# Patient Record
Sex: Female | Born: 1950
Health system: Southern US, Community
[De-identification: ages and names within clinical notes are randomized; demographics above are authoritative.]

## PROBLEM LIST (undated history)

## (undated) ENCOUNTER — Emergency Department (HOSPITAL_COMMUNITY): Discharge status: Against Medical Advice | Payer: BC Managed Care – PPO

## (undated) DIAGNOSIS — E785 Hyperlipidemia, unspecified: Secondary | ICD-10-CM

## (undated) DIAGNOSIS — K2970 Gastritis, unspecified, without bleeding: Secondary | ICD-10-CM

## (undated) DIAGNOSIS — E039 Hypothyroidism, unspecified: Secondary | ICD-10-CM

## (undated) DIAGNOSIS — M8580 Other specified disorders of bone density and structure, unspecified site: Secondary | ICD-10-CM

## (undated) DIAGNOSIS — D649 Anemia, unspecified: Secondary | ICD-10-CM

## (undated) DIAGNOSIS — F329 Major depressive disorder, single episode, unspecified: Secondary | ICD-10-CM

## (undated) DIAGNOSIS — F32A Depression, unspecified: Secondary | ICD-10-CM

## (undated) DIAGNOSIS — K219 Gastro-esophageal reflux disease without esophagitis: Secondary | ICD-10-CM

## (undated) DIAGNOSIS — Z9181 History of falling: Secondary | ICD-10-CM

## (undated) DIAGNOSIS — F419 Anxiety disorder, unspecified: Secondary | ICD-10-CM

## (undated) DIAGNOSIS — I10 Essential (primary) hypertension: Secondary | ICD-10-CM

## (undated) DIAGNOSIS — E063 Autoimmune thyroiditis: Secondary | ICD-10-CM

## (undated) DIAGNOSIS — M25519 Pain in unspecified shoulder: Secondary | ICD-10-CM

## (undated) HISTORY — DX: Other specified disorders of bone density and structure, unspecified site: M85.80

## (undated) HISTORY — PX: OTHER SURGICAL HISTORY: SHX169

## (undated) HISTORY — DX: Hyperlipidemia, unspecified: E78.5

## (undated) HISTORY — DX: Essential (primary) hypertension: I10

## (undated) HISTORY — DX: Hypothyroidism, unspecified: E03.9

## (undated) HISTORY — DX: Autoimmune thyroiditis: E06.3

## (undated) HISTORY — DX: Gastritis, unspecified, without bleeding: K29.70

## (undated) HISTORY — PX: BREAST SURGERY: SHX581

## (undated) HISTORY — DX: Gastro-esophageal reflux disease without esophagitis: K21.9

## (undated) HISTORY — DX: Pain in unspecified shoulder: M25.519

## (undated) HISTORY — PX: UMBILICAL HERNIA REPAIR: SHX196

---
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
## 1992-10-31 HISTORY — PX: TOTAL ABDOMINAL HYSTERECTOMY: SHX209

## 2001-05-08 ENCOUNTER — Other Ambulatory Visit: Admission: RE | Admit: 2001-05-08 | Discharge: 2001-05-08 | Payer: Self-pay | Admitting: *Deleted

## 2001-05-25 ENCOUNTER — Ambulatory Visit (HOSPITAL_COMMUNITY): Admission: RE | Admit: 2001-05-25 | Discharge: 2001-05-25 | Payer: Self-pay | Admitting: General Surgery

## 2001-05-28 ENCOUNTER — Encounter: Payer: Self-pay | Admitting: General Surgery

## 2001-05-28 ENCOUNTER — Ambulatory Visit (HOSPITAL_COMMUNITY): Admission: RE | Admit: 2001-05-28 | Discharge: 2001-05-28 | Payer: Self-pay | Admitting: General Surgery

## 2001-06-18 ENCOUNTER — Encounter: Payer: Self-pay | Admitting: Family Medicine

## 2001-06-18 ENCOUNTER — Ambulatory Visit (HOSPITAL_COMMUNITY): Admission: RE | Admit: 2001-06-18 | Discharge: 2001-06-18 | Payer: Self-pay | Admitting: Family Medicine

## 2001-07-16 ENCOUNTER — Ambulatory Visit (HOSPITAL_COMMUNITY): Admission: RE | Admit: 2001-07-16 | Discharge: 2001-07-16 | Payer: Self-pay | Admitting: Family Medicine

## 2001-07-16 ENCOUNTER — Encounter: Payer: Self-pay | Admitting: Family Medicine

## 2002-01-14 ENCOUNTER — Encounter: Payer: Self-pay | Admitting: Family Medicine

## 2002-01-14 ENCOUNTER — Ambulatory Visit (HOSPITAL_COMMUNITY): Admission: RE | Admit: 2002-01-14 | Discharge: 2002-01-14 | Payer: Self-pay | Admitting: Family Medicine

## 2002-04-04 ENCOUNTER — Ambulatory Visit (HOSPITAL_COMMUNITY): Admission: RE | Admit: 2002-04-04 | Discharge: 2002-04-04 | Payer: Self-pay | Admitting: Neurology

## 2002-04-04 ENCOUNTER — Encounter: Payer: Self-pay | Admitting: Neurology

## 2002-07-12 ENCOUNTER — Ambulatory Visit (HOSPITAL_COMMUNITY): Admission: RE | Admit: 2002-07-12 | Discharge: 2002-07-12 | Payer: Self-pay | Admitting: *Deleted

## 2002-07-12 ENCOUNTER — Encounter: Payer: Self-pay | Admitting: *Deleted

## 2002-08-06 ENCOUNTER — Encounter (HOSPITAL_COMMUNITY): Admission: RE | Admit: 2002-08-06 | Discharge: 2002-09-05 | Payer: Self-pay | Admitting: Endocrinology

## 2002-09-16 ENCOUNTER — Encounter: Payer: Self-pay | Admitting: General Surgery

## 2002-09-17 ENCOUNTER — Inpatient Hospital Stay (HOSPITAL_COMMUNITY): Admission: RE | Admit: 2002-09-17 | Discharge: 2002-09-19 | Payer: Self-pay | Admitting: General Surgery

## 2002-09-17 ENCOUNTER — Encounter: Payer: Self-pay | Admitting: Family Medicine

## 2002-09-22 ENCOUNTER — Emergency Department (HOSPITAL_COMMUNITY): Admission: EM | Admit: 2002-09-22 | Discharge: 2002-09-22 | Payer: Self-pay | Admitting: *Deleted

## 2002-10-31 HISTORY — PX: OTHER SURGICAL HISTORY: SHX169

## 2003-04-11 ENCOUNTER — Encounter: Payer: Self-pay | Admitting: Family Medicine

## 2003-04-11 ENCOUNTER — Ambulatory Visit (HOSPITAL_COMMUNITY): Admission: RE | Admit: 2003-04-11 | Discharge: 2003-04-11 | Payer: Self-pay | Admitting: Family Medicine

## 2003-04-16 ENCOUNTER — Encounter: Payer: Self-pay | Admitting: Family Medicine

## 2003-04-16 ENCOUNTER — Encounter (HOSPITAL_COMMUNITY): Admission: RE | Admit: 2003-04-16 | Discharge: 2003-05-16 | Payer: Self-pay | Admitting: Family Medicine

## 2003-07-14 ENCOUNTER — Ambulatory Visit (HOSPITAL_COMMUNITY): Admission: RE | Admit: 2003-07-14 | Discharge: 2003-07-14 | Payer: Self-pay | Admitting: Family Medicine

## 2003-07-14 ENCOUNTER — Encounter: Payer: Self-pay | Admitting: Family Medicine

## 2003-09-22 ENCOUNTER — Ambulatory Visit (HOSPITAL_COMMUNITY): Admission: RE | Admit: 2003-09-22 | Discharge: 2003-09-22 | Payer: Self-pay | Admitting: Family Medicine

## 2003-10-12 ENCOUNTER — Emergency Department (HOSPITAL_COMMUNITY): Admission: EM | Admit: 2003-10-12 | Discharge: 2003-10-12 | Payer: Self-pay | Admitting: Emergency Medicine

## 2003-11-01 HISTORY — PX: OTHER SURGICAL HISTORY: SHX169

## 2004-07-14 ENCOUNTER — Ambulatory Visit (HOSPITAL_COMMUNITY): Admission: RE | Admit: 2004-07-14 | Discharge: 2004-07-14 | Payer: Self-pay | Admitting: Family Medicine

## 2004-08-03 ENCOUNTER — Encounter (HOSPITAL_COMMUNITY): Admission: RE | Admit: 2004-08-03 | Discharge: 2004-09-02 | Payer: Self-pay | Admitting: Oncology

## 2004-08-03 ENCOUNTER — Encounter: Admission: RE | Admit: 2004-08-03 | Discharge: 2004-08-03 | Payer: Self-pay | Admitting: Oncology

## 2004-10-11 ENCOUNTER — Ambulatory Visit: Payer: Self-pay | Admitting: Family Medicine

## 2004-11-11 ENCOUNTER — Ambulatory Visit: Payer: Self-pay | Admitting: Family Medicine

## 2004-12-02 ENCOUNTER — Ambulatory Visit: Payer: Self-pay | Admitting: Family Medicine

## 2005-01-05 ENCOUNTER — Ambulatory Visit: Payer: Self-pay | Admitting: Family Medicine

## 2005-01-21 ENCOUNTER — Ambulatory Visit: Payer: Self-pay | Admitting: Family Medicine

## 2005-01-27 ENCOUNTER — Ambulatory Visit: Payer: Self-pay | Admitting: Family Medicine

## 2005-03-14 ENCOUNTER — Ambulatory Visit: Payer: Self-pay | Admitting: Family Medicine

## 2005-04-28 ENCOUNTER — Ambulatory Visit: Payer: Self-pay | Admitting: Family Medicine

## 2005-06-24 ENCOUNTER — Ambulatory Visit: Payer: Self-pay | Admitting: Family Medicine

## 2005-07-15 ENCOUNTER — Ambulatory Visit (HOSPITAL_COMMUNITY): Admission: RE | Admit: 2005-07-15 | Discharge: 2005-07-15 | Payer: Self-pay | Admitting: Family Medicine

## 2005-08-10 ENCOUNTER — Ambulatory Visit: Payer: Self-pay | Admitting: Family Medicine

## 2005-08-23 ENCOUNTER — Ambulatory Visit: Payer: Self-pay | Admitting: Family Medicine

## 2005-10-18 ENCOUNTER — Emergency Department (HOSPITAL_COMMUNITY): Admission: EM | Admit: 2005-10-18 | Discharge: 2005-10-18 | Payer: Self-pay | Admitting: Emergency Medicine

## 2005-11-15 ENCOUNTER — Encounter: Payer: Self-pay | Admitting: Family Medicine

## 2005-12-12 ENCOUNTER — Ambulatory Visit: Payer: Self-pay | Admitting: Family Medicine

## 2005-12-13 ENCOUNTER — Ambulatory Visit (HOSPITAL_COMMUNITY): Admission: RE | Admit: 2005-12-13 | Discharge: 2005-12-13 | Payer: Self-pay | Admitting: Family Medicine

## 2005-12-21 ENCOUNTER — Encounter: Payer: Self-pay | Admitting: Family Medicine

## 2005-12-29 ENCOUNTER — Ambulatory Visit: Payer: Self-pay | Admitting: Family Medicine

## 2006-01-04 ENCOUNTER — Ambulatory Visit: Payer: Self-pay | Admitting: Family Medicine

## 2006-01-11 ENCOUNTER — Ambulatory Visit (HOSPITAL_COMMUNITY): Admission: RE | Admit: 2006-01-11 | Discharge: 2006-01-11 | Payer: Self-pay | Admitting: Family Medicine

## 2006-01-11 ENCOUNTER — Encounter (HOSPITAL_COMMUNITY): Admission: RE | Admit: 2006-01-11 | Discharge: 2006-02-10 | Payer: Self-pay | Admitting: Family Medicine

## 2006-01-11 IMAGING — CR DG KNEE 4+V BILAT
8 series · 8 of 8 positions shown · non-contrast
Comparison: none

CLINICAL DATA: 54 year-old who fell.
 BILATERAL KNEES ? 4 VIEW:
 LEFT KNEE ? 2 VIEW:

[view not recorded (1 of 8)]
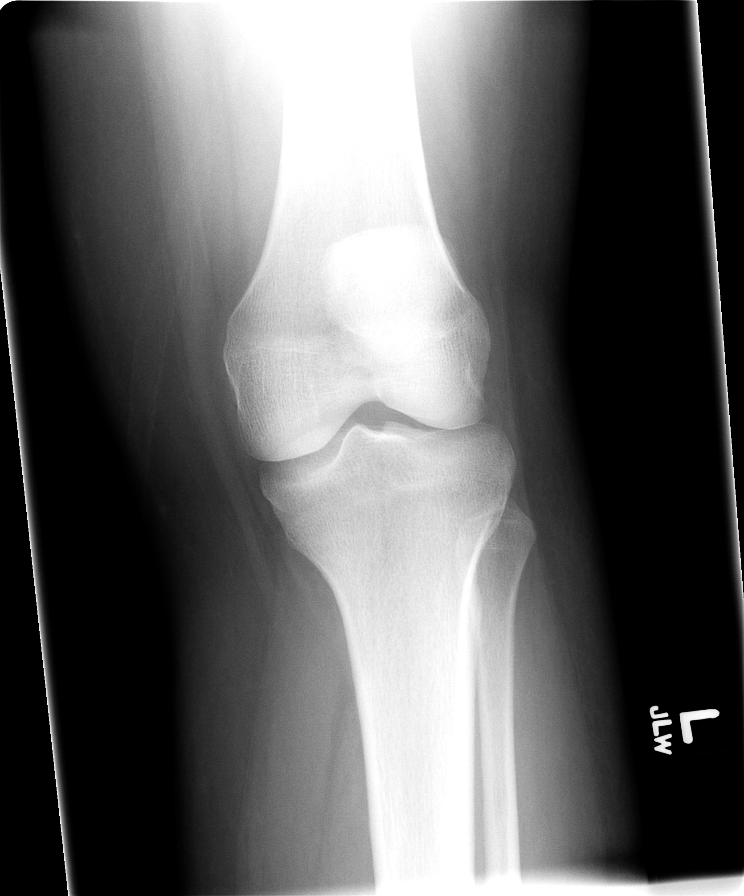

[view not recorded (2 of 8)]
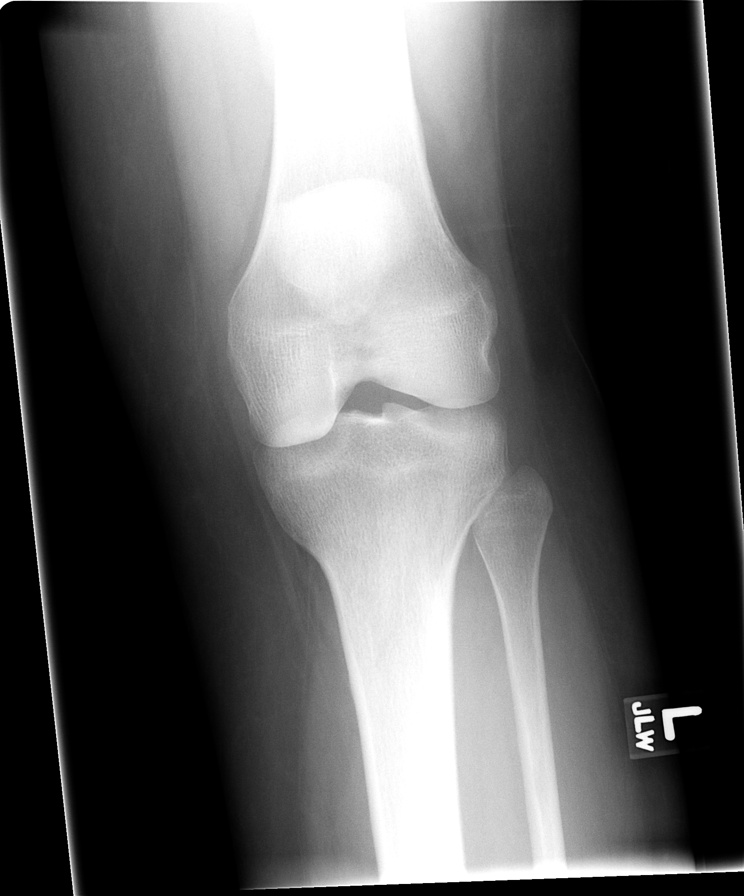

[view not recorded (3 of 8)]
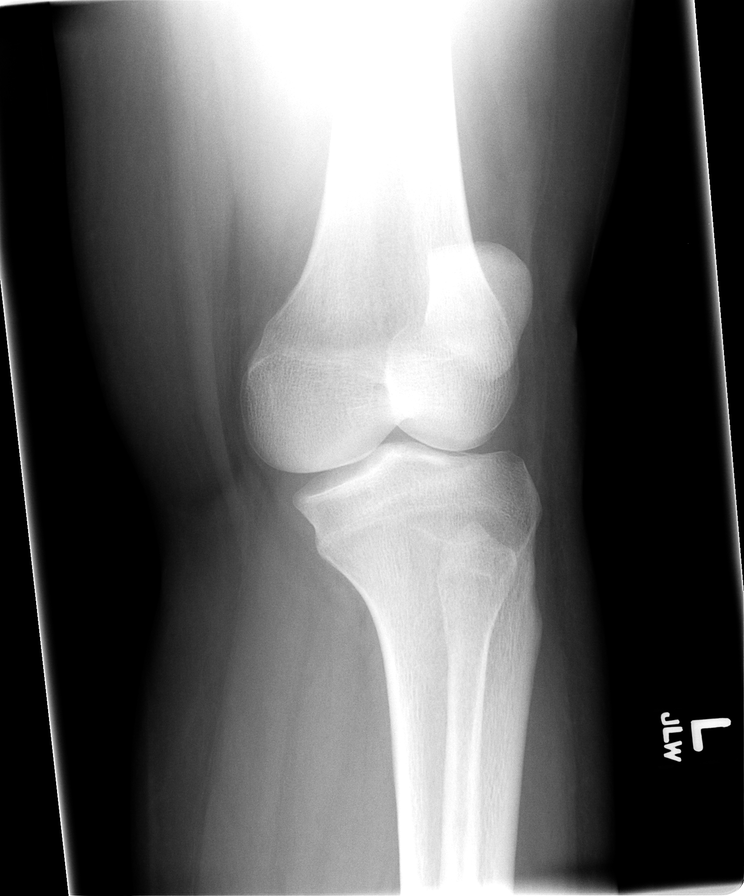

[view not recorded (4 of 8)]
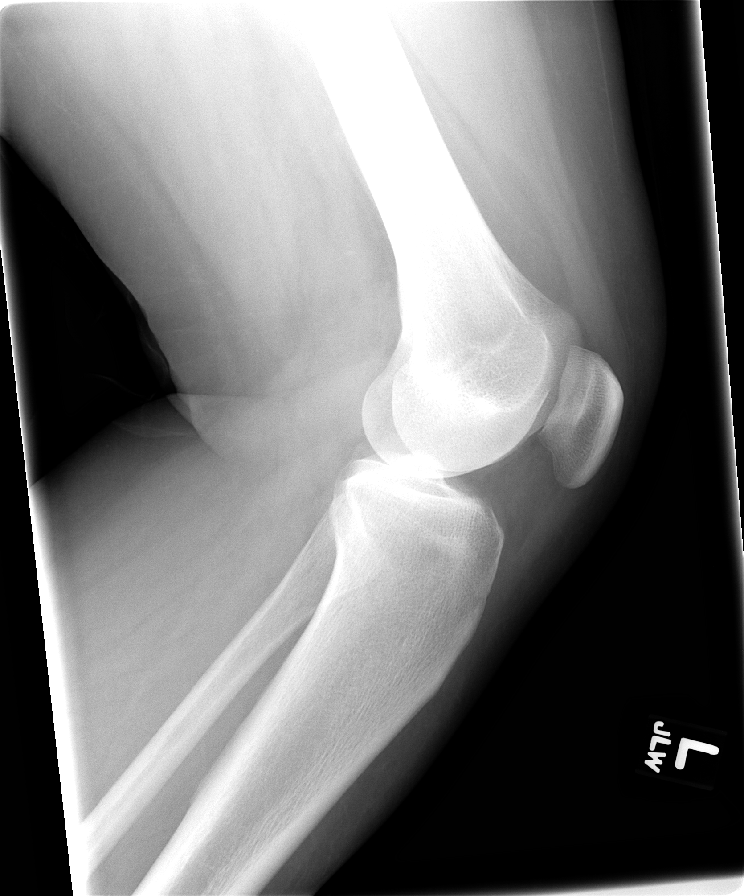

[view not recorded (5 of 8)]
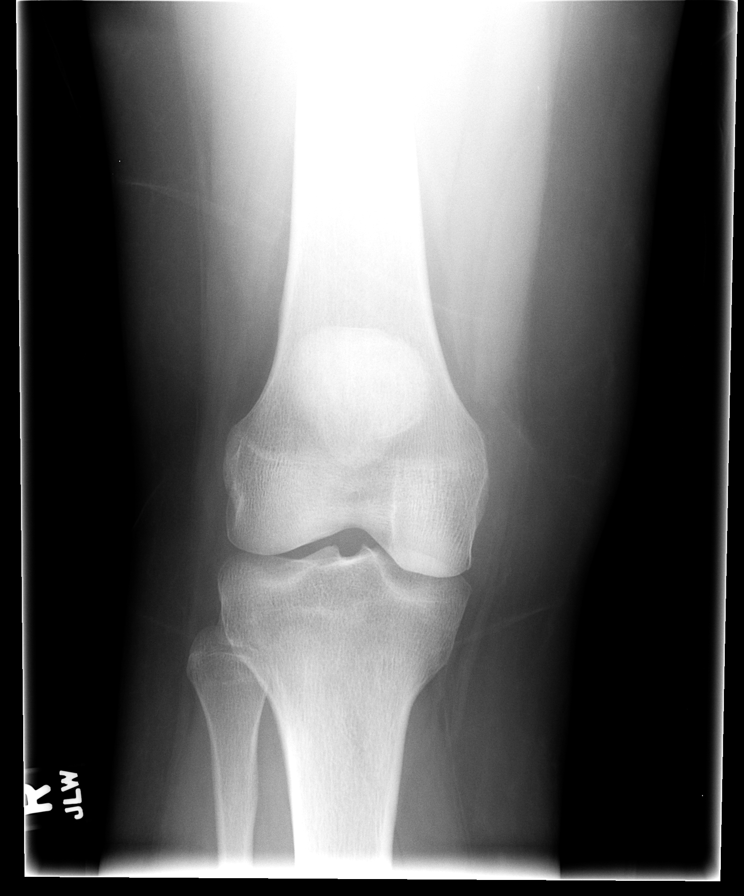

[view not recorded (6 of 8)]
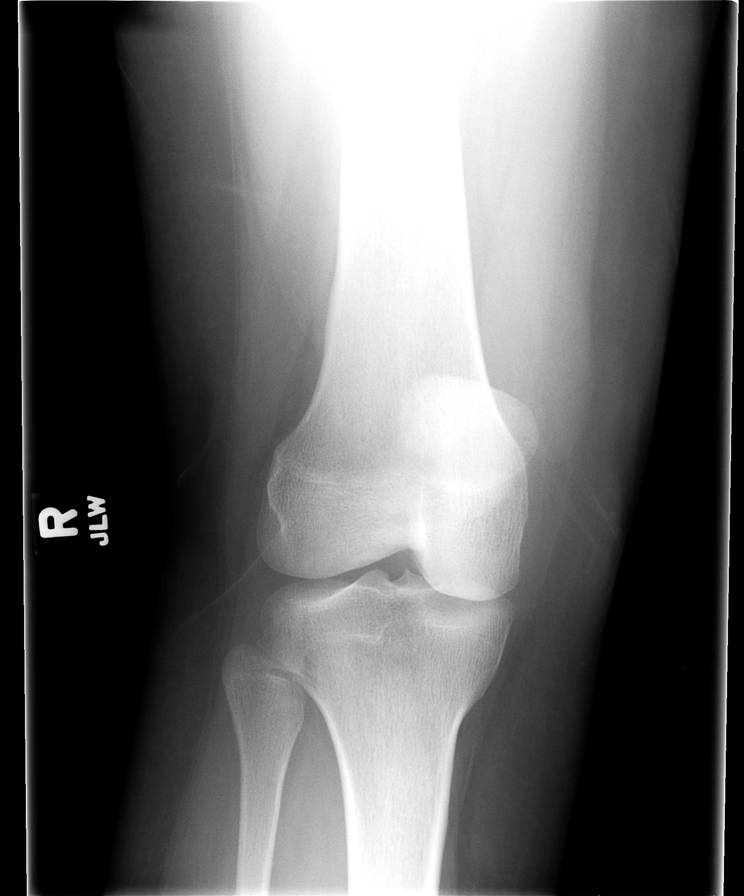

[view not recorded (7 of 8)]
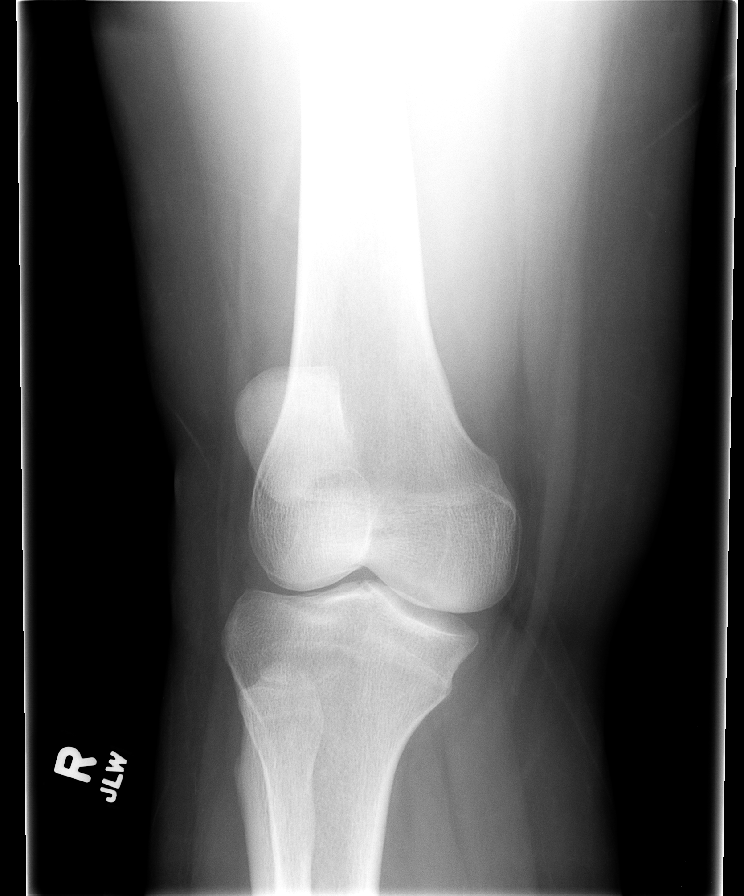

[view not recorded (8 of 8)]
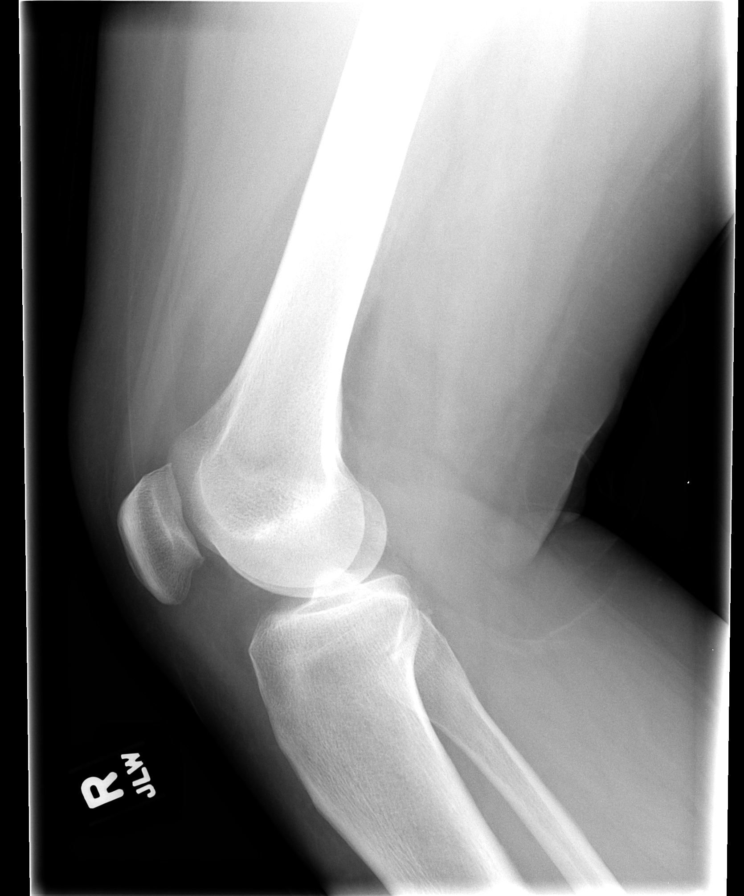

[8 of 8 positions shown; findings below may reference images not displayed]

FINDINGS: There is no evidence of fracture, dislocation, or joint effusion.  There is no evidence of arthropathy or other focal bone abnormality.  Soft tissues are unremarkable.
IMPRESSION: Negative.
 RIGHT KNEE ? 2 VIEW:
FINDINGS: There is no evidence of fracture, dislocation, or joint effusion.  There is no evidence of arthropathy or other focal bone abnormality.  Soft tissues are unremarkable.
IMPRESSION: Negative.

## 2006-01-16 ENCOUNTER — Ambulatory Visit: Payer: Self-pay | Admitting: Family Medicine

## 2006-02-01 ENCOUNTER — Ambulatory Visit: Payer: Self-pay | Admitting: Family Medicine

## 2006-02-02 ENCOUNTER — Ambulatory Visit (HOSPITAL_COMMUNITY): Admission: RE | Admit: 2006-02-02 | Discharge: 2006-02-02 | Payer: Self-pay | Admitting: General Surgery

## 2006-02-02 LAB — HM COLONOSCOPY: HM Colonoscopy: NORMAL

## 2006-02-08 ENCOUNTER — Ambulatory Visit: Payer: Self-pay | Admitting: Family Medicine

## 2006-02-14 ENCOUNTER — Encounter (HOSPITAL_COMMUNITY): Admission: RE | Admit: 2006-02-14 | Discharge: 2006-03-16 | Payer: Self-pay | Admitting: Family Medicine

## 2006-03-08 ENCOUNTER — Ambulatory Visit: Payer: Self-pay | Admitting: Family Medicine

## 2006-03-20 ENCOUNTER — Encounter (HOSPITAL_COMMUNITY): Admission: RE | Admit: 2006-03-20 | Discharge: 2006-04-19 | Payer: Self-pay | Admitting: Family Medicine

## 2006-05-10 ENCOUNTER — Ambulatory Visit: Payer: Self-pay | Admitting: Family Medicine

## 2006-05-10 ENCOUNTER — Other Ambulatory Visit: Admission: RE | Admit: 2006-05-10 | Discharge: 2006-05-10 | Payer: Self-pay | Admitting: Family Medicine

## 2006-06-22 ENCOUNTER — Encounter: Payer: Self-pay | Admitting: Family Medicine

## 2006-08-07 ENCOUNTER — Ambulatory Visit (HOSPITAL_COMMUNITY): Admission: RE | Admit: 2006-08-07 | Discharge: 2006-08-07 | Payer: Self-pay | Admitting: Family Medicine

## 2006-08-21 ENCOUNTER — Ambulatory Visit: Payer: Self-pay | Admitting: Family Medicine

## 2006-09-11 ENCOUNTER — Ambulatory Visit: Payer: Self-pay | Admitting: Family Medicine

## 2006-12-18 ENCOUNTER — Ambulatory Visit: Payer: Self-pay | Admitting: Family Medicine

## 2007-01-03 ENCOUNTER — Ambulatory Visit: Payer: Self-pay | Admitting: Family Medicine

## 2007-03-28 ENCOUNTER — Ambulatory Visit: Payer: Self-pay | Admitting: Family Medicine

## 2007-05-14 ENCOUNTER — Ambulatory Visit: Payer: Self-pay | Admitting: Family Medicine

## 2007-05-14 ENCOUNTER — Encounter: Payer: Self-pay | Admitting: Family Medicine

## 2007-05-14 ENCOUNTER — Other Ambulatory Visit: Admission: RE | Admit: 2007-05-14 | Discharge: 2007-05-14 | Payer: Self-pay | Admitting: Family Medicine

## 2007-05-16 ENCOUNTER — Encounter: Payer: Self-pay | Admitting: Family Medicine

## 2007-05-16 LAB — CONVERTED CEMR LAB
Candida species: NEGATIVE
GC Probe Amp, Genital: NEGATIVE
Trichomonal Vaginitis: NEGATIVE

## 2007-06-21 ENCOUNTER — Encounter: Payer: Self-pay | Admitting: Family Medicine

## 2007-08-10 ENCOUNTER — Encounter: Payer: Self-pay | Admitting: Family Medicine

## 2007-08-10 LAB — CONVERTED CEMR LAB
AST: 19 units/L (ref 0–37)
Alkaline Phosphatase: 65 units/L (ref 39–117)
Bilirubin, Direct: <0.1 mg/dL (ref 0.0–0.3)
CO2: 26 meq/L (ref 19–32)
Calcium: 9.5 mg/dL (ref 8.4–10.5)
Creatinine, Ser: 0.99 mg/dL (ref 0.40–1.20)
Glucose, Bld: 85 mg/dL (ref 70–99)
Total Bilirubin: 0.5 mg/dL (ref 0.3–1.2)

## 2007-08-13 ENCOUNTER — Ambulatory Visit (HOSPITAL_COMMUNITY): Admission: RE | Admit: 2007-08-13 | Discharge: 2007-08-13 | Payer: Self-pay | Admitting: Family Medicine

## 2007-08-14 ENCOUNTER — Ambulatory Visit: Payer: Self-pay | Admitting: Family Medicine

## 2007-09-17 ENCOUNTER — Ambulatory Visit: Payer: Self-pay | Admitting: Family Medicine

## 2007-11-01 ENCOUNTER — Encounter: Payer: Self-pay | Admitting: Family Medicine

## 2007-11-01 HISTORY — PX: OTHER SURGICAL HISTORY: SHX169

## 2007-12-18 ENCOUNTER — Ambulatory Visit: Payer: Self-pay | Admitting: Family Medicine

## 2007-12-20 ENCOUNTER — Encounter: Payer: Self-pay | Admitting: Family Medicine

## 2007-12-28 ENCOUNTER — Ambulatory Visit (HOSPITAL_COMMUNITY): Admission: RE | Admit: 2007-12-28 | Discharge: 2007-12-28 | Payer: Self-pay | Admitting: Family Medicine

## 2007-12-28 IMAGING — US US RENAL
1 series · 14 of 25 positions shown · non-contrast
Comparison: none

HISTORY: Hypertension, renal disease, flank pain

[Series 1: unknown · 0.26mm/px · 14 of 33 slices shown]
[im 1/33]
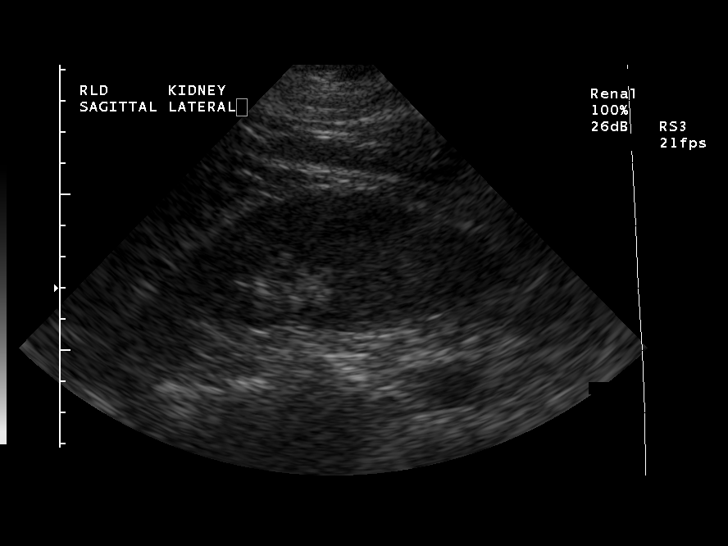
[im 3/33]
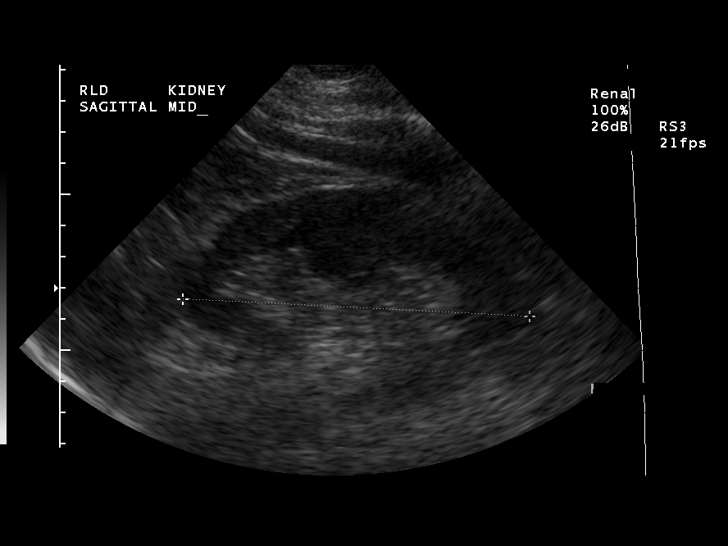
[im 6/33]
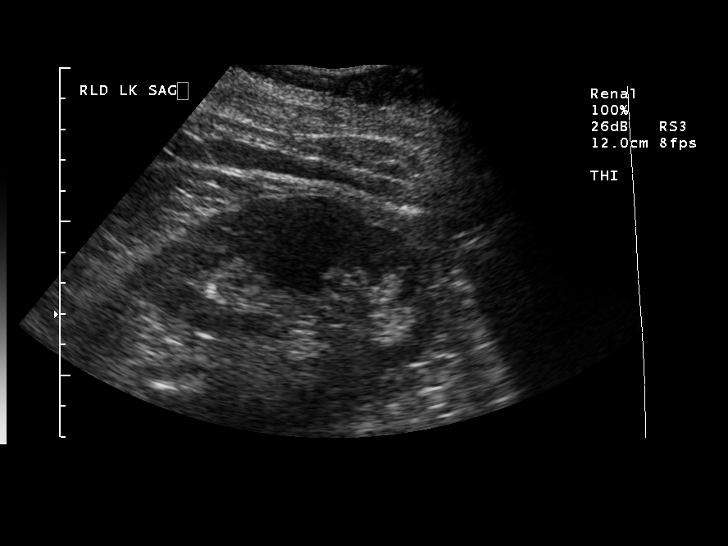
[im 9/33]
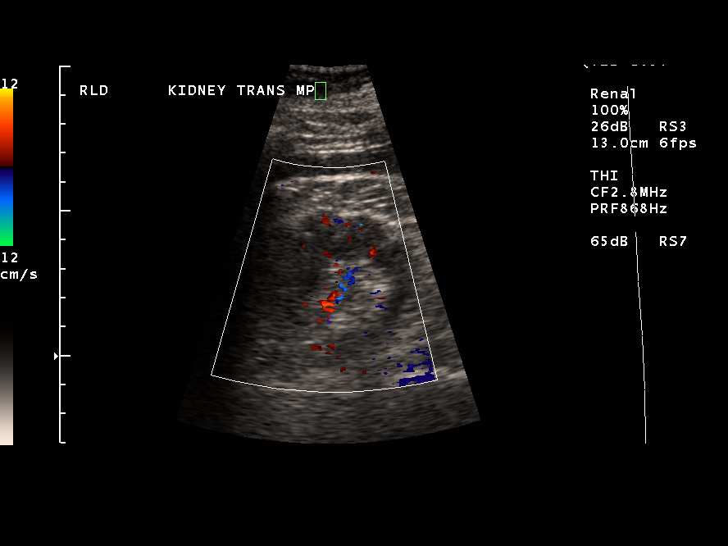
[im 11/33]
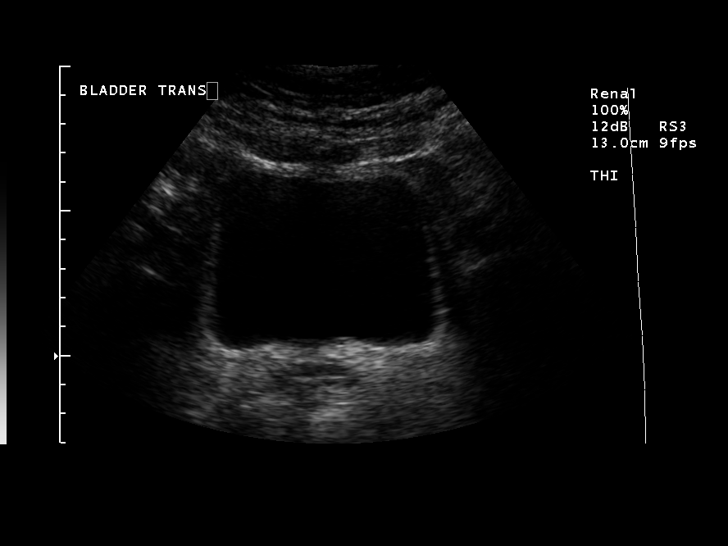
[im 13/33]
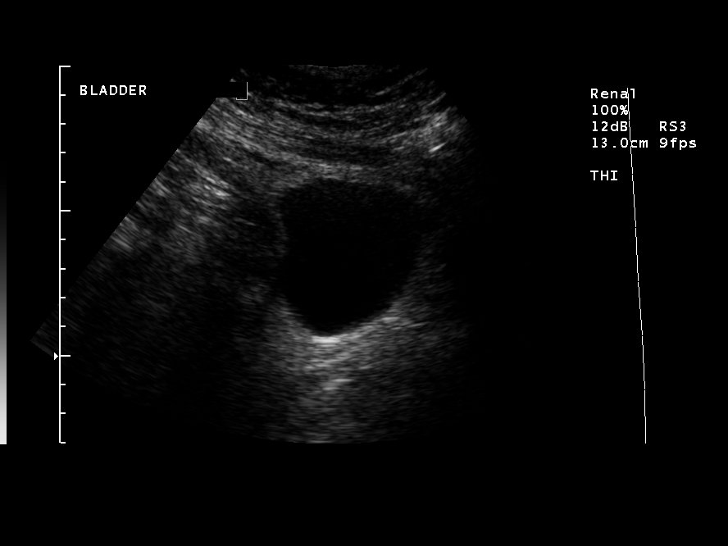
[im 15/33]
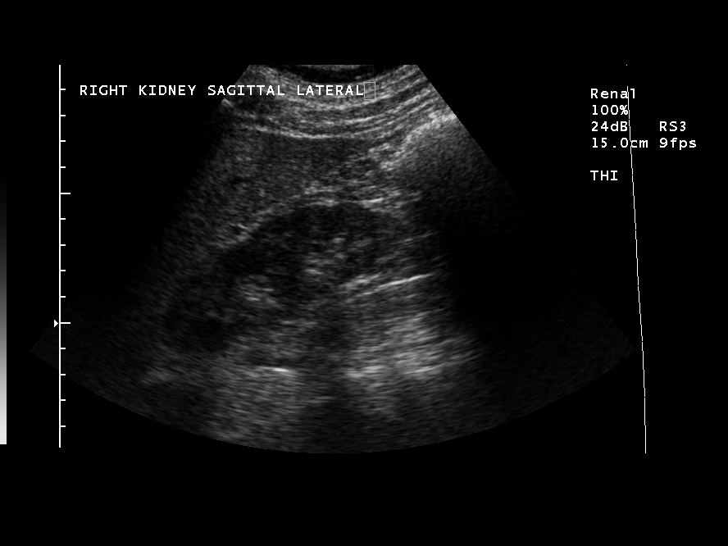
[im 18/33]
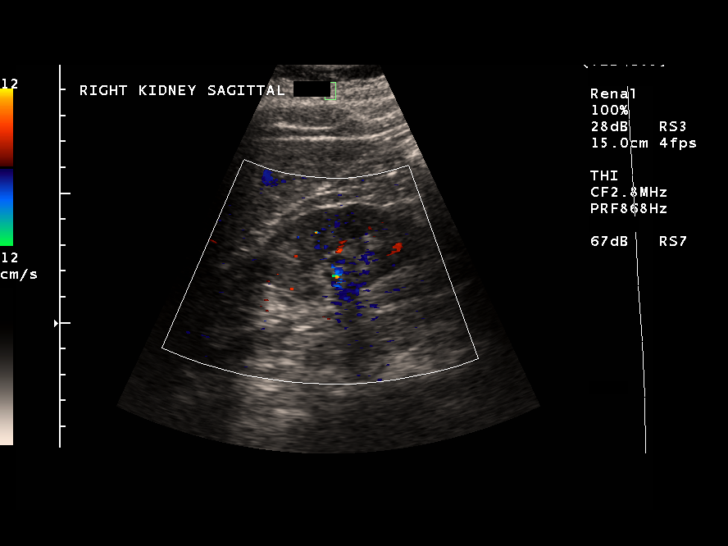
[im 21/33]
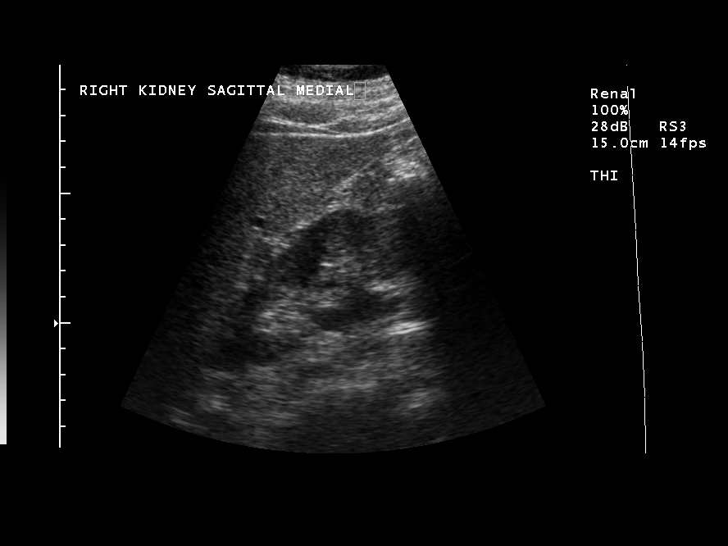
[im 22/33]
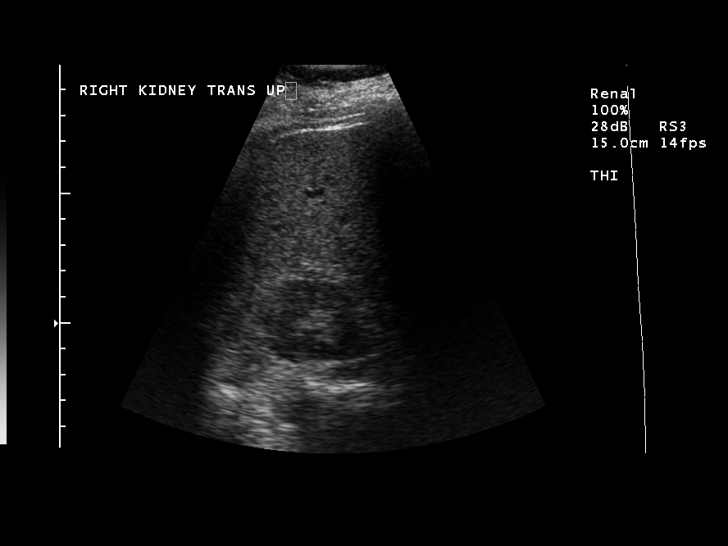
[im 25/33]
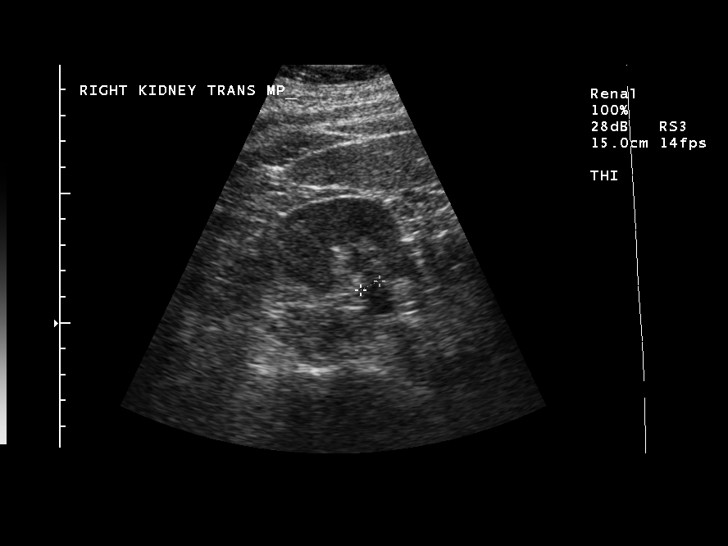
[im 27/33]
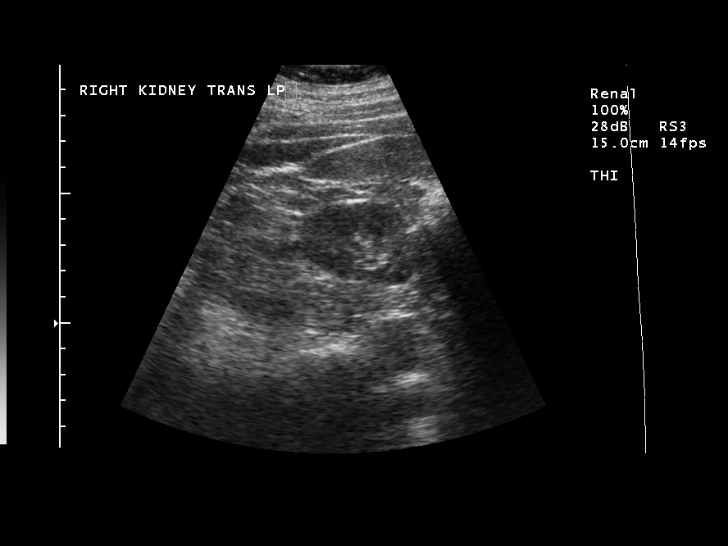
[im 30/33]
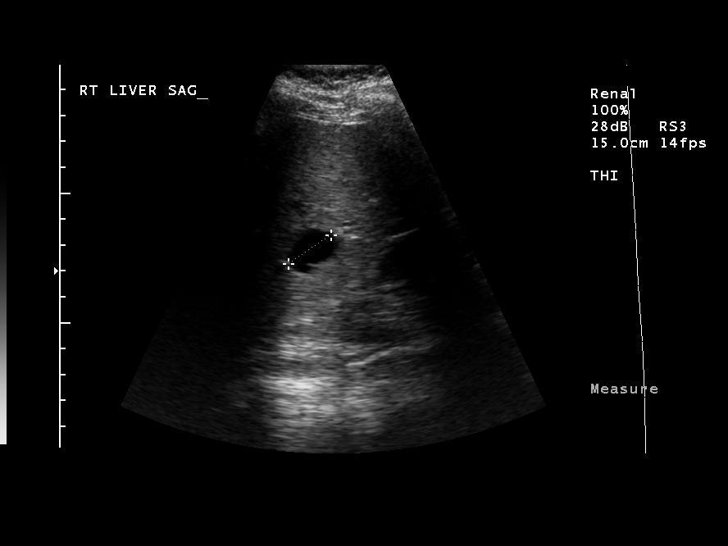
[im 33/33]
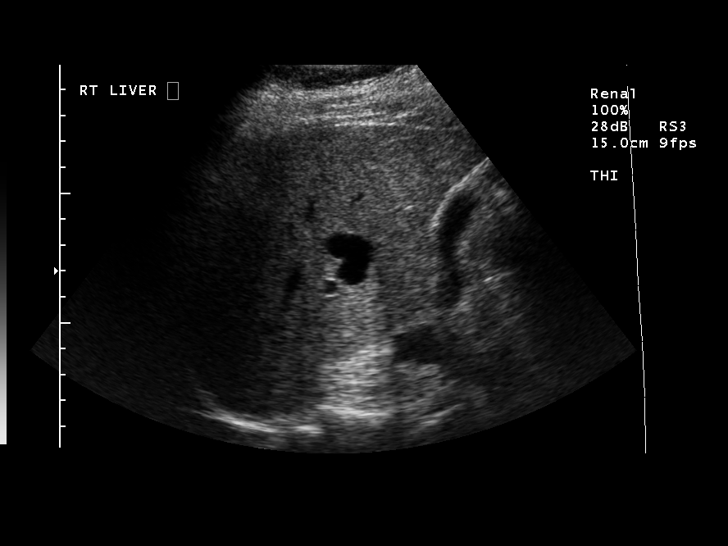

[14 of 25 positions shown; findings below may reference images not displayed]

RENAL ULTRASOUND:

Sonography of the kidneys and urinary bladder performed.

Kidneys measure 11.6 cm length right and 11.1 cm length left.
Normal renal cortical thickness and echogenicity bilaterally.
In addition, prominent renal pyramid identified at mid left kidney.
No renal mass or shadowing calcification.
Mildly prominent extrarenal pelvis of right kidney without definite calyceal
dilatation.
Bladder well distended and unremarkable.
Ureteral jets were not visualized.
Incidentally noted cyst within central liver., 2.4 cm greatest size.
IMPRESSION: Small hepatic cyst.
Minimal pelviectasis of right kidney without definite caliectasis.

## 2008-02-18 ENCOUNTER — Ambulatory Visit: Payer: Self-pay | Admitting: Family Medicine

## 2008-02-19 ENCOUNTER — Encounter: Payer: Self-pay | Admitting: Family Medicine

## 2008-02-19 LAB — CONVERTED CEMR LAB
AST: 18 units/L (ref 0–37)
Alkaline Phosphatase: 68 units/L (ref 39–117)
Bilirubin, Direct: <0.1 mg/dL (ref 0.0–0.3)
CO2: 29 meq/L (ref 19–32)
Calcium: 9.6 mg/dL (ref 8.4–10.5)
Creatinine, Ser: 1.04 mg/dL (ref 0.40–1.20)
Glucose, Bld: 87 mg/dL (ref 70–99)
TSH: 7.207 microintl units/mL — ABNORMAL HIGH (ref 0.350–5.50)
Total Bilirubin: 0.3 mg/dL (ref 0.3–1.2)
Total CHOL/HDL Ratio: 4.3

## 2008-03-03 ENCOUNTER — Ambulatory Visit (HOSPITAL_COMMUNITY): Admission: RE | Admit: 2008-03-03 | Discharge: 2008-03-03 | Payer: Self-pay | Admitting: Family Medicine

## 2008-03-03 ENCOUNTER — Ambulatory Visit: Payer: Self-pay | Admitting: Family Medicine

## 2008-03-03 IMAGING — US US EXREM LOW ARTERIAL SEG MULTIPLE BILAT
1 series · 1 of 1 positions shown · non-contrast
Comparison: None available

CLINICAL DATA: Hypertension, leg pain

BILAT LOWER EXTREMITY ARTERIAL SEGMENTAL EVAL

[Series 1: us exrem low arterial seg multiple bilat · 0.09mm/px · 1 of 1 slices shown]
[im 1/1]
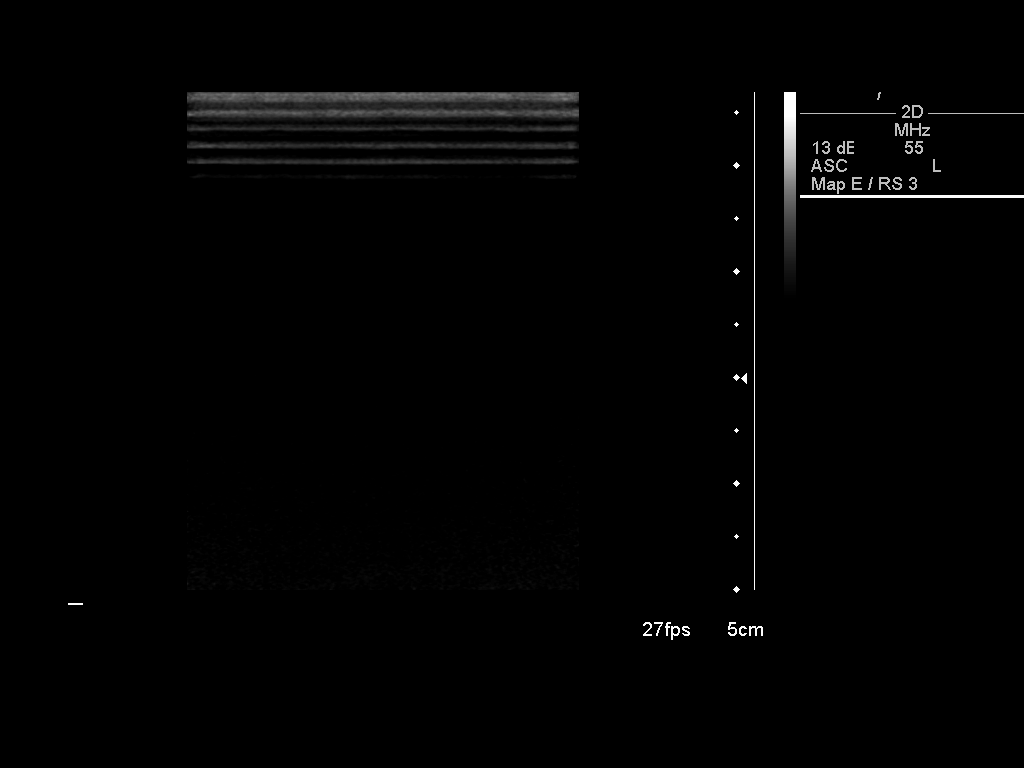

[1 of 1 positions shown; findings below may reference images not displayed]

FINDINGS: At rest, the right ABI is 1.16, left 1.14. Triphasic
waveforms are noted throughout both lower extremities on Doppler
interrogation. Pulse volume recording is normal bilaterally.
Exercise testing was not performed.
IMPRESSION: 1. No evidence of hemodynamically significant lower extremity
arterial occlusive disease at rest

## 2008-03-25 ENCOUNTER — Encounter (HOSPITAL_COMMUNITY): Admission: RE | Admit: 2008-03-25 | Discharge: 2008-04-24 | Payer: Self-pay | Admitting: Orthopedic Surgery

## 2008-05-16 ENCOUNTER — Encounter: Payer: Self-pay | Admitting: Family Medicine

## 2008-05-16 DIAGNOSIS — M25519 Pain in unspecified shoulder: Secondary | ICD-10-CM | POA: Insufficient documentation

## 2008-05-16 DIAGNOSIS — N3090 Cystitis, unspecified without hematuria: Secondary | ICD-10-CM | POA: Insufficient documentation

## 2008-05-16 DIAGNOSIS — M949 Disorder of cartilage, unspecified: Secondary | ICD-10-CM

## 2008-05-16 DIAGNOSIS — I10 Essential (primary) hypertension: Secondary | ICD-10-CM | POA: Insufficient documentation

## 2008-05-16 DIAGNOSIS — R32 Unspecified urinary incontinence: Secondary | ICD-10-CM | POA: Insufficient documentation

## 2008-05-16 DIAGNOSIS — M899 Disorder of bone, unspecified: Secondary | ICD-10-CM | POA: Insufficient documentation

## 2008-05-16 DIAGNOSIS — E782 Mixed hyperlipidemia: Secondary | ICD-10-CM | POA: Insufficient documentation

## 2008-05-16 DIAGNOSIS — E785 Hyperlipidemia, unspecified: Secondary | ICD-10-CM | POA: Insufficient documentation

## 2008-05-19 ENCOUNTER — Encounter: Payer: Self-pay | Admitting: Family Medicine

## 2008-05-19 ENCOUNTER — Ambulatory Visit: Payer: Self-pay | Admitting: Family Medicine

## 2008-05-19 ENCOUNTER — Other Ambulatory Visit: Admission: RE | Admit: 2008-05-19 | Discharge: 2008-05-19 | Payer: Self-pay | Admitting: Family Medicine

## 2008-05-19 LAB — CONVERTED CEMR LAB
Alkaline Phosphatase: 54 units/L (ref 39–117)
BUN: 13 mg/dL (ref 6–23)
Basophils Relative: 1 % (ref 0–1)
CO2: 26 meq/L (ref 19–32)
Chloride: 101 meq/L (ref 96–112)
Creatinine, Ser: 1.08 mg/dL (ref 0.40–1.20)
Eosinophils Relative: 1 % (ref 0–5)
HCT: 32.8 % — ABNORMAL LOW (ref 36.0–46.0)
Hemoglobin: 10.2 g/dL — ABNORMAL LOW (ref 12.0–15.0)
Indirect Bilirubin: 0.3 mg/dL (ref 0.0–0.9)
LDL Cholesterol: 112 mg/dL — ABNORMAL HIGH (ref 0–99)
Lymphocytes Relative: 44 % (ref 12–46)
MCHC: 31.1 g/dL (ref 30.0–36.0)
Monocytes Absolute: 0.4 10*3/uL (ref 0.1–1.0)
Monocytes Relative: 10 % (ref 3–12)
Neutro Abs: 1.6 10*3/uL — ABNORMAL LOW (ref 1.7–7.7)
RBC: 3.74 M/uL — ABNORMAL LOW (ref 3.87–5.11)
Total Bilirubin: 0.4 mg/dL (ref 0.3–1.2)

## 2008-06-05 ENCOUNTER — Ambulatory Visit (HOSPITAL_COMMUNITY): Admission: RE | Admit: 2008-06-05 | Discharge: 2008-06-05 | Payer: Self-pay | Admitting: Urology

## 2008-06-05 IMAGING — US US RENAL
1 series · 14 of 25 positions shown · non-contrast
Comparison: [DATE]

CLINICAL DATA: Urgency, and comments, UTI

RENAL/URINARY TRACT ULTRASOUND
TECHNIQUE: Complete ultrasound examination of the urinary tract
was performed including evaluation of the kidneys renal collecting
systems and urinary bladder.

[Series 1: unknown · 0.28mm/px · 14 of 33 slices shown]
[im 1/33]
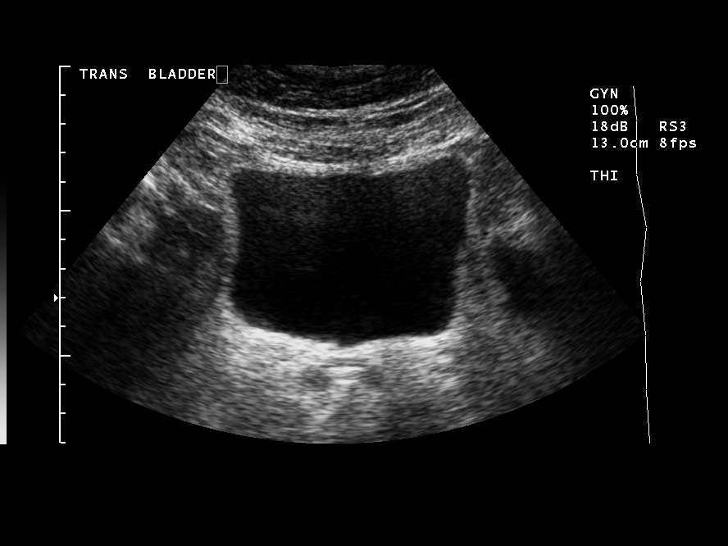
[im 3/33]
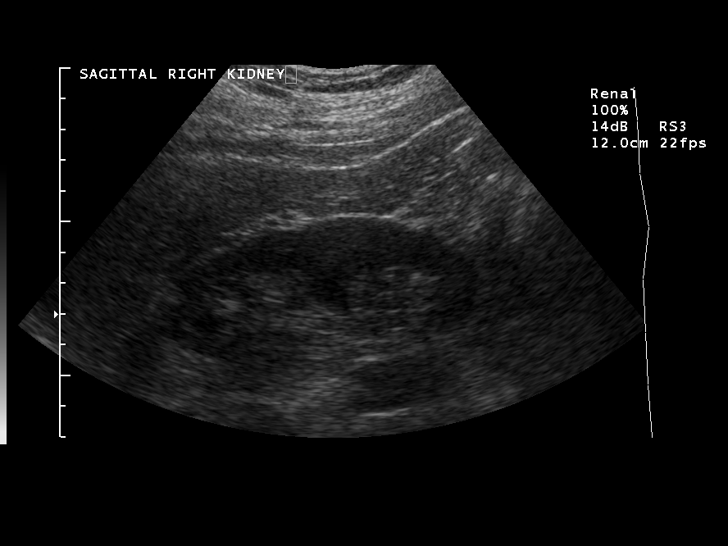
[im 6/33]
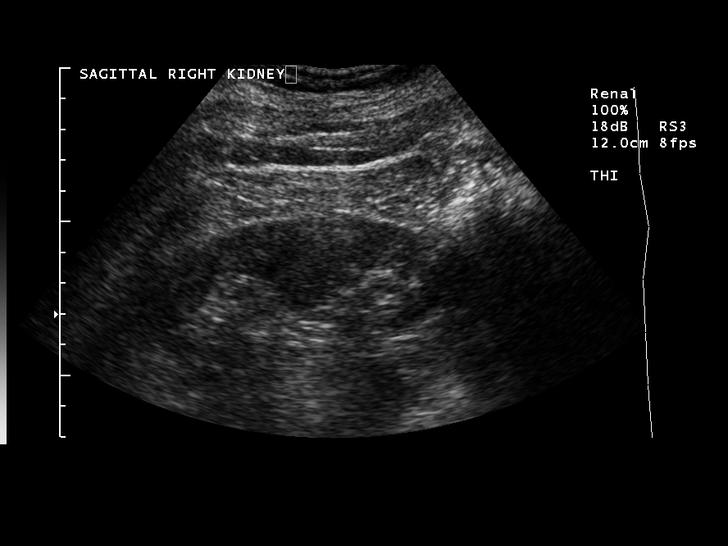
[im 9/33]
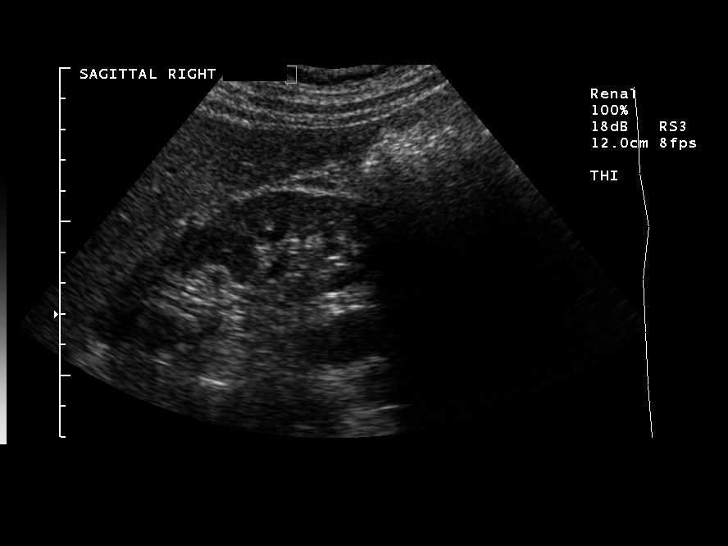
[im 11/33]
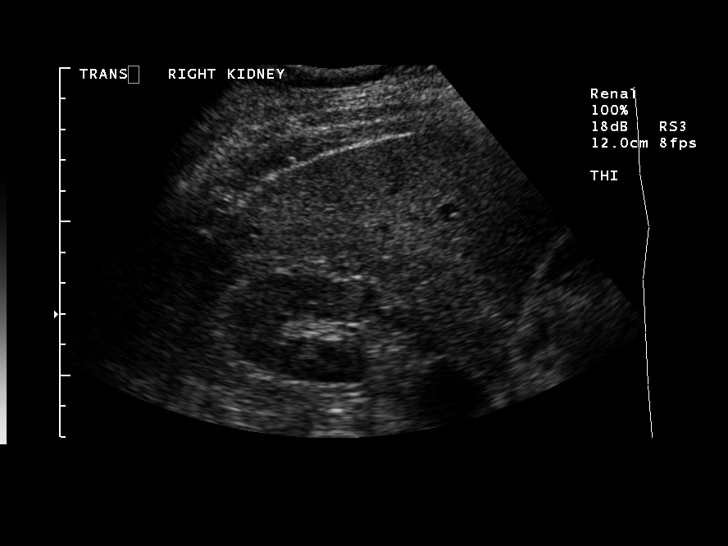
[im 13/33]
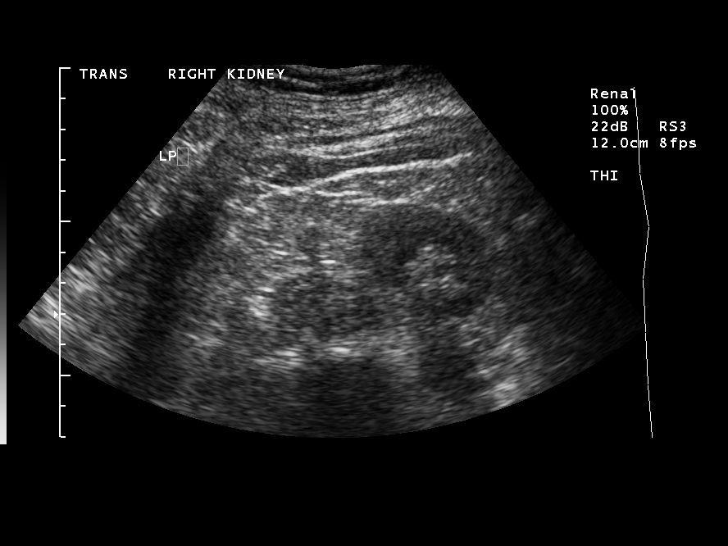
[im 15/33]
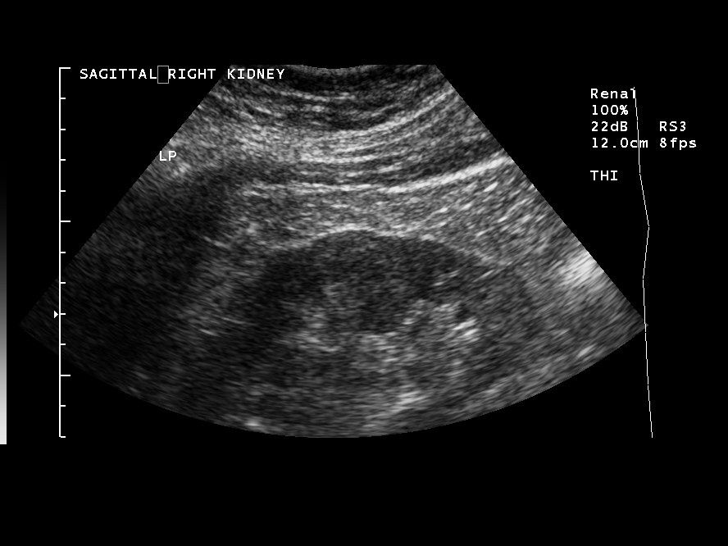
[im 18/33]
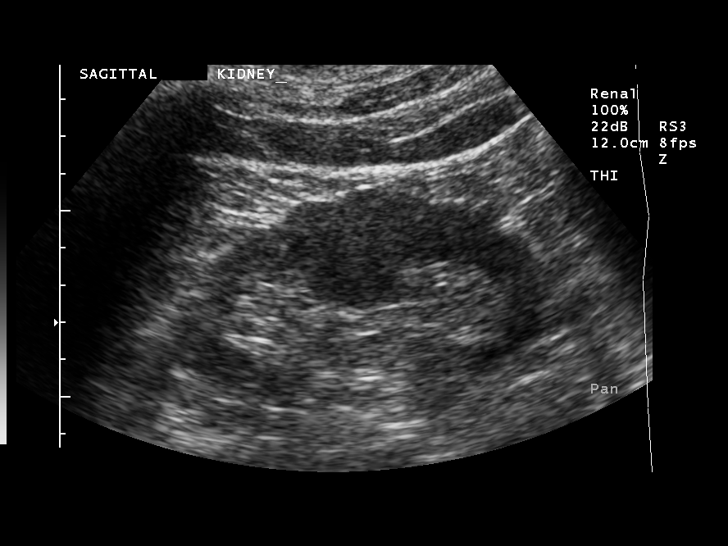
[im 21/33]
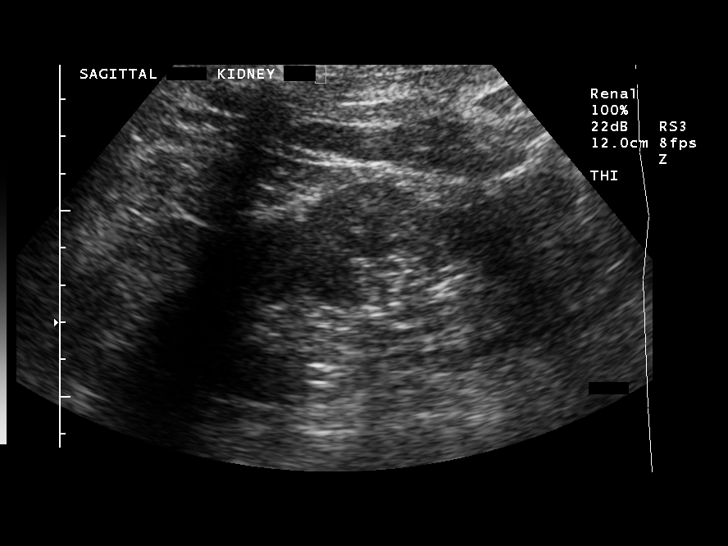
[im 22/33]
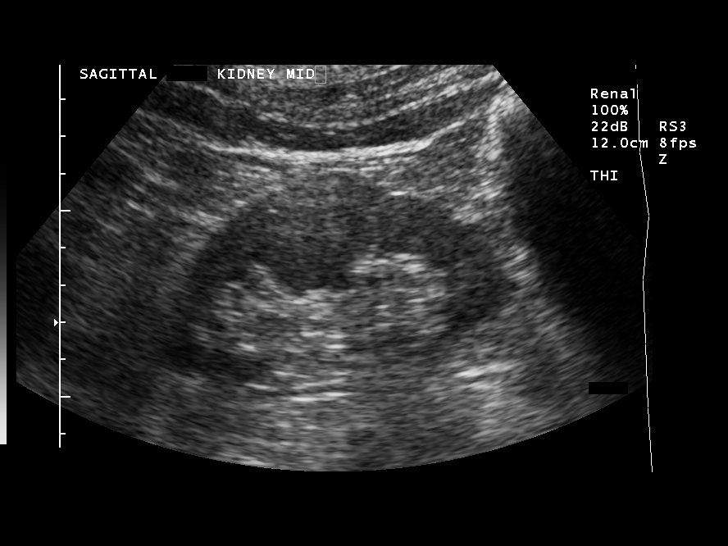
[im 25/33]
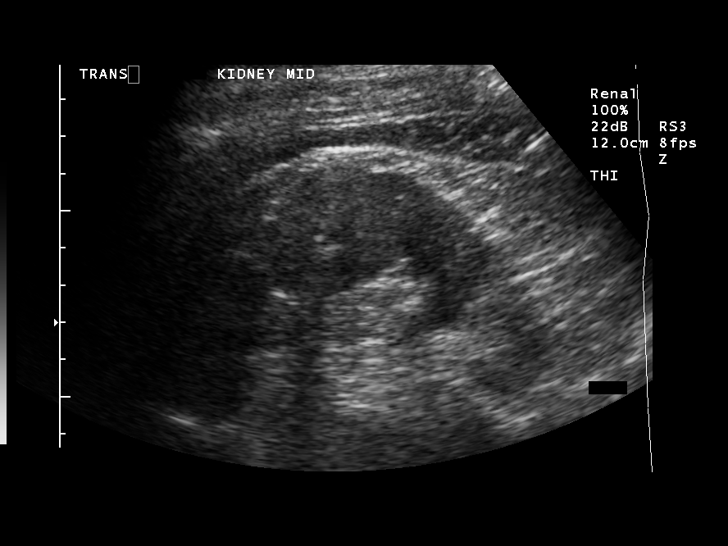
[im 27/33]
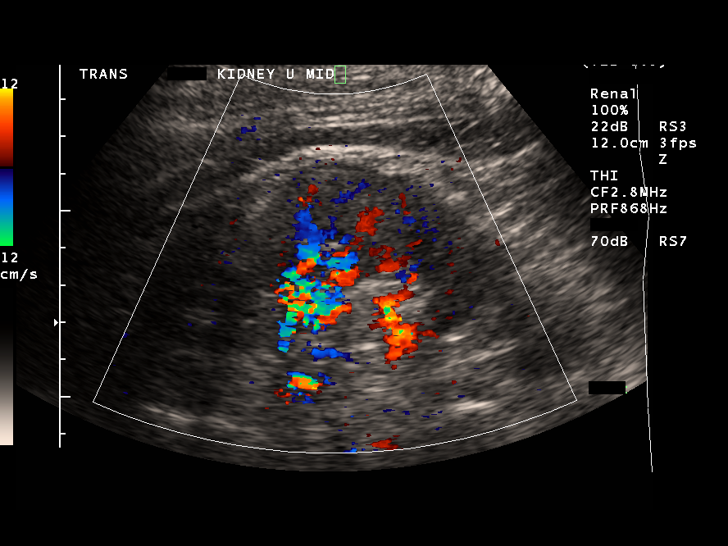
[im 30/33]
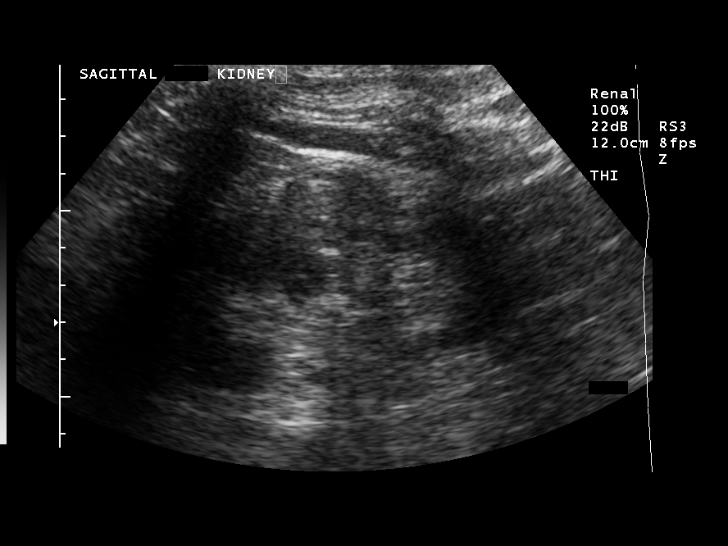
[im 33/33]
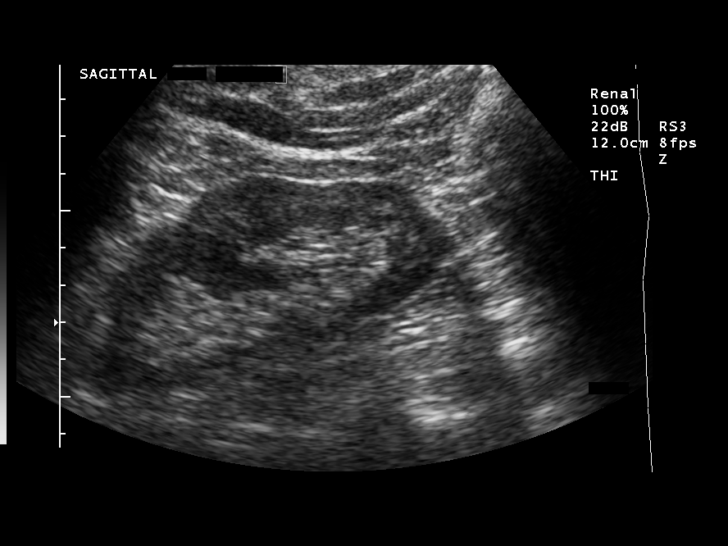

[14 of 25 positions shown; findings below may reference images not displayed]

FINDINGS: Kidneys measure 10.3 cm length right 10.4 cm length left.
Normal renal cortical thickness and echogenicity bilaterally.
No renal mass, hydronephrosis, or shadowing calcification.
No perinephric fluid.
Prominent column of Bertin at mid left kidney stable.
Bladder well distended and unremarkable.
IMPRESSION: Normal renal ultrasound.

## 2008-06-09 ENCOUNTER — Encounter: Payer: Self-pay | Admitting: Family Medicine

## 2008-06-19 ENCOUNTER — Encounter: Payer: Self-pay | Admitting: Family Medicine

## 2008-06-24 ENCOUNTER — Encounter: Payer: Self-pay | Admitting: Family Medicine

## 2008-07-01 ENCOUNTER — Ambulatory Visit (HOSPITAL_COMMUNITY): Admission: RE | Admit: 2008-07-01 | Discharge: 2008-07-01 | Payer: Self-pay | Admitting: Urology

## 2008-07-01 ENCOUNTER — Encounter (INDEPENDENT_AMBULATORY_CARE_PROVIDER_SITE_OTHER): Payer: Self-pay | Admitting: Urology

## 2008-08-05 ENCOUNTER — Encounter: Payer: Self-pay | Admitting: Family Medicine

## 2008-08-13 ENCOUNTER — Ambulatory Visit (HOSPITAL_COMMUNITY): Admission: RE | Admit: 2008-08-13 | Discharge: 2008-08-13 | Payer: Self-pay | Admitting: Family Medicine

## 2008-08-19 ENCOUNTER — Ambulatory Visit: Payer: Self-pay | Admitting: Family Medicine

## 2008-08-19 DIAGNOSIS — R5383 Other fatigue: Secondary | ICD-10-CM

## 2008-08-19 DIAGNOSIS — R5381 Other malaise: Secondary | ICD-10-CM | POA: Insufficient documentation

## 2008-08-19 LAB — CONVERTED CEMR LAB
Protein, U semiquant: 100
Urobilinogen, UA: 1

## 2008-08-21 ENCOUNTER — Encounter: Payer: Self-pay | Admitting: Family Medicine

## 2008-08-25 ENCOUNTER — Encounter: Payer: Self-pay | Admitting: Family Medicine

## 2008-09-09 ENCOUNTER — Encounter: Payer: Self-pay | Admitting: Family Medicine

## 2008-09-09 LAB — CONVERTED CEMR LAB: Retic Ct Pct: 1.7 % (ref 0.4–3.1)

## 2008-10-08 ENCOUNTER — Ambulatory Visit: Payer: Self-pay | Admitting: Family Medicine

## 2008-10-08 DIAGNOSIS — M542 Cervicalgia: Secondary | ICD-10-CM | POA: Insufficient documentation

## 2008-10-12 DIAGNOSIS — T7840XA Allergy, unspecified, initial encounter: Secondary | ICD-10-CM | POA: Insufficient documentation

## 2008-10-13 ENCOUNTER — Telehealth: Payer: Self-pay | Admitting: Family Medicine

## 2008-10-27 ENCOUNTER — Encounter: Payer: Self-pay | Admitting: Family Medicine

## 2008-10-27 LAB — CONVERTED CEMR LAB: Retic Ct Pct: 0.5 % (ref 0.4–3.1)

## 2008-10-30 ENCOUNTER — Ambulatory Visit (HOSPITAL_COMMUNITY): Admission: RE | Admit: 2008-10-30 | Discharge: 2008-10-30 | Payer: Self-pay | Admitting: Family Medicine

## 2008-10-31 HISTORY — PX: BREAST EXCISIONAL BIOPSY: SUR124

## 2009-01-16 ENCOUNTER — Ambulatory Visit: Payer: Self-pay | Admitting: Family Medicine

## 2009-01-21 ENCOUNTER — Ambulatory Visit (HOSPITAL_COMMUNITY): Admission: RE | Admit: 2009-01-21 | Discharge: 2009-01-21 | Payer: Self-pay | Admitting: Family Medicine

## 2009-01-21 ENCOUNTER — Encounter: Payer: Self-pay | Admitting: Family Medicine

## 2009-02-06 ENCOUNTER — Encounter: Payer: Self-pay | Admitting: Family Medicine

## 2009-03-02 ENCOUNTER — Telehealth: Payer: Self-pay | Admitting: Family Medicine

## 2009-03-06 ENCOUNTER — Telehealth: Payer: Self-pay | Admitting: Family Medicine

## 2009-04-01 ENCOUNTER — Encounter: Payer: Self-pay | Admitting: Family Medicine

## 2009-04-08 ENCOUNTER — Telehealth: Payer: Self-pay | Admitting: Family Medicine

## 2009-04-20 ENCOUNTER — Ambulatory Visit: Payer: Self-pay | Admitting: Family Medicine

## 2009-04-20 DIAGNOSIS — K219 Gastro-esophageal reflux disease without esophagitis: Secondary | ICD-10-CM | POA: Insufficient documentation

## 2009-04-27 ENCOUNTER — Ambulatory Visit (HOSPITAL_COMMUNITY): Admission: RE | Admit: 2009-04-27 | Discharge: 2009-04-27 | Payer: Self-pay | Admitting: Family Medicine

## 2009-04-27 ENCOUNTER — Encounter: Payer: Self-pay | Admitting: Family Medicine

## 2009-04-27 LAB — CONVERTED CEMR LAB
AST: 21 units/L (ref 0–37)
Albumin: 4.1 g/dL (ref 3.5–5.2)
Alkaline Phosphatase: 53 units/L (ref 39–117)
Bilirubin, Direct: 0.1 mg/dL (ref 0.0–0.3)
HDL: 50 mg/dL (ref >39)
Indirect Bilirubin: 0.3 mg/dL (ref 0.0–0.9)
Total Bilirubin: 0.4 mg/dL (ref 0.3–1.2)

## 2009-04-27 IMAGING — US US ABDOMEN COMPLETE
1 series · 14 of 25 positions shown · non-contrast
Comparison: None

CLINICAL DATA: Bloating, dyspepsia

COMPLETE ABDOMINAL ULTRASOUND

[Series 1: us abdomen complete · 0.30mm/px · 14 of 79 slices shown]
[im 1/79]
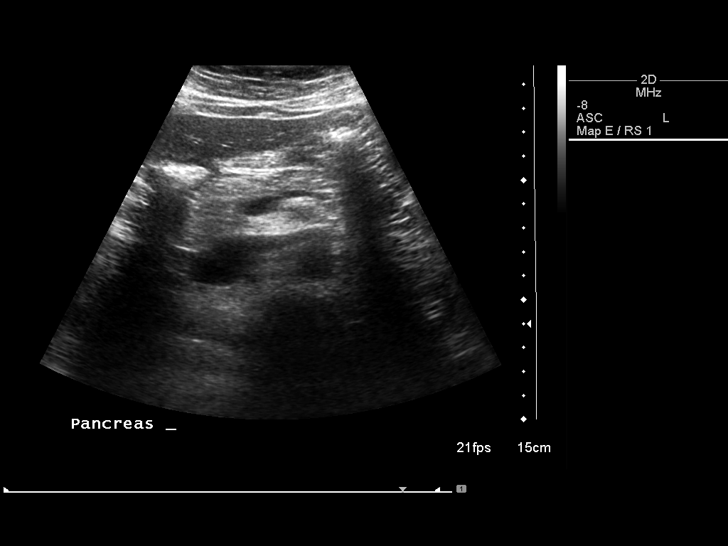
[im 7/79]
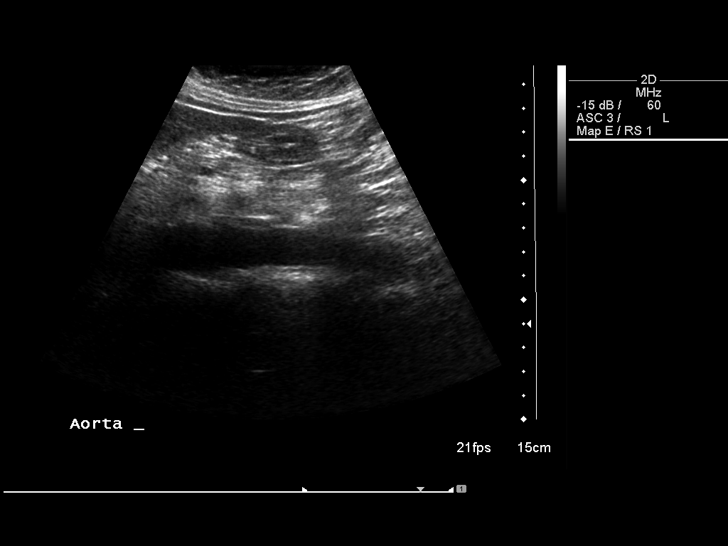
[im 14/79]
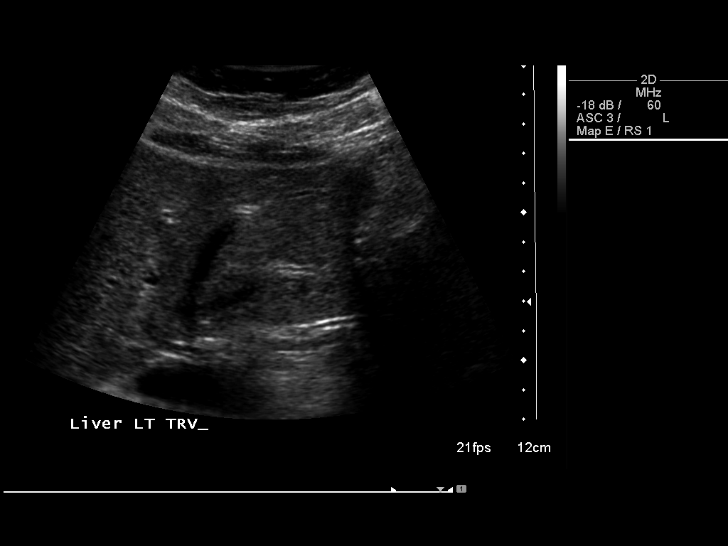
[im 20/79]
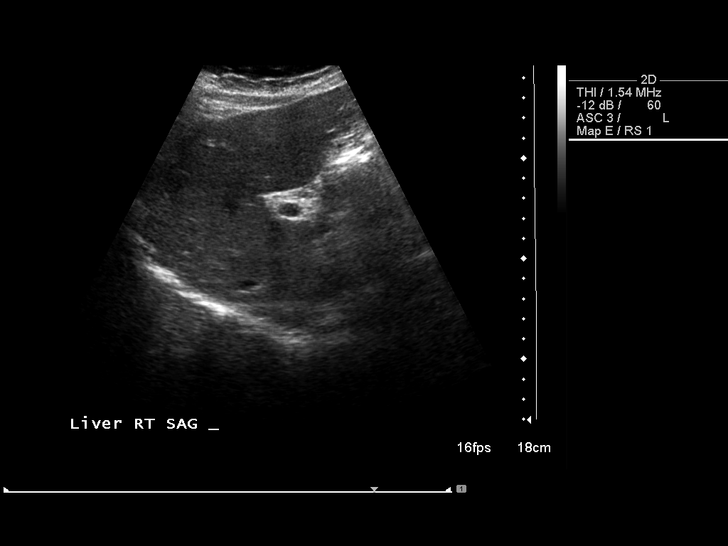
[im 27/79]
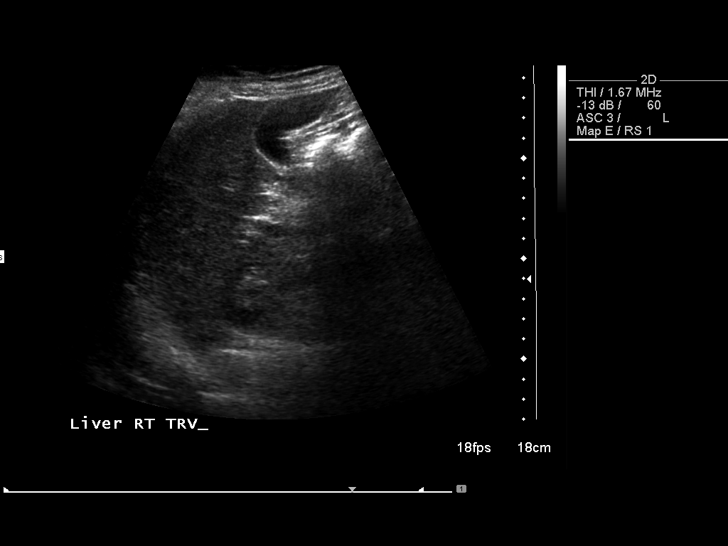
[im 30/79]
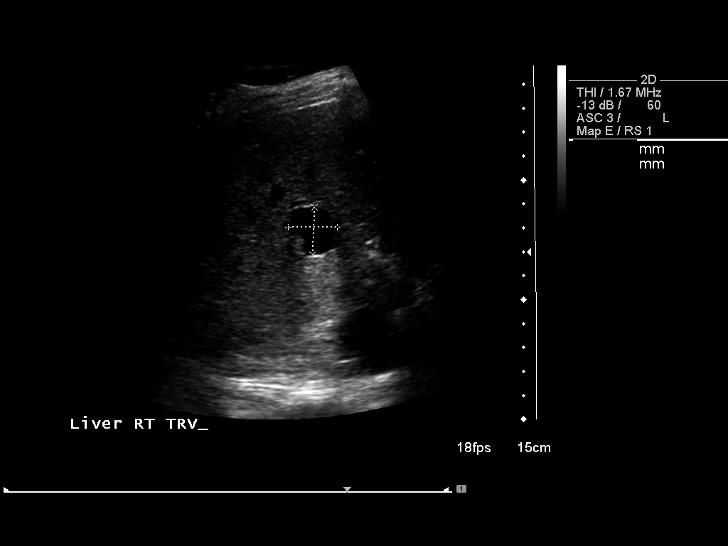
[im 36/79]
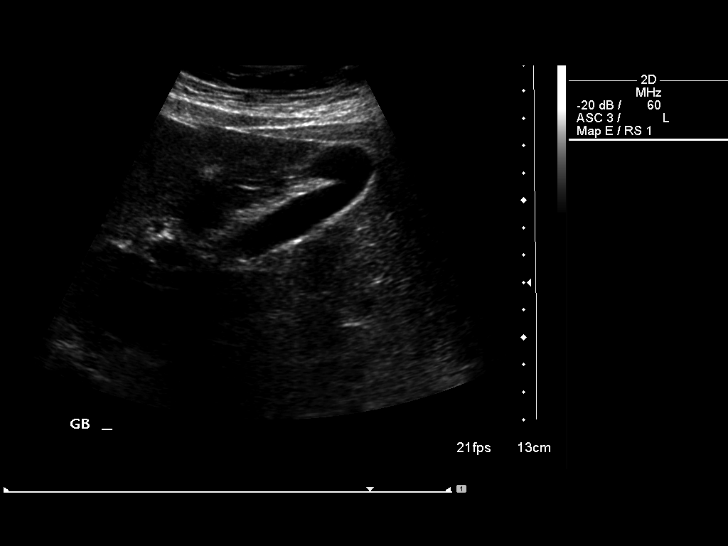
[im 43/79]
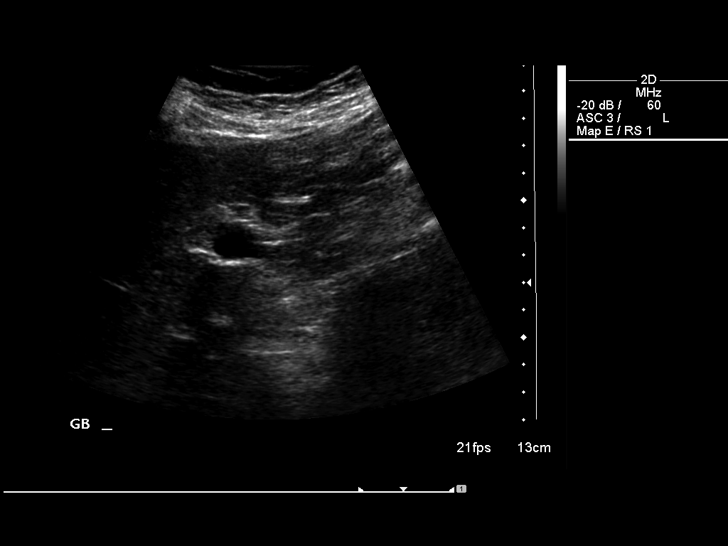
[im 49/79]
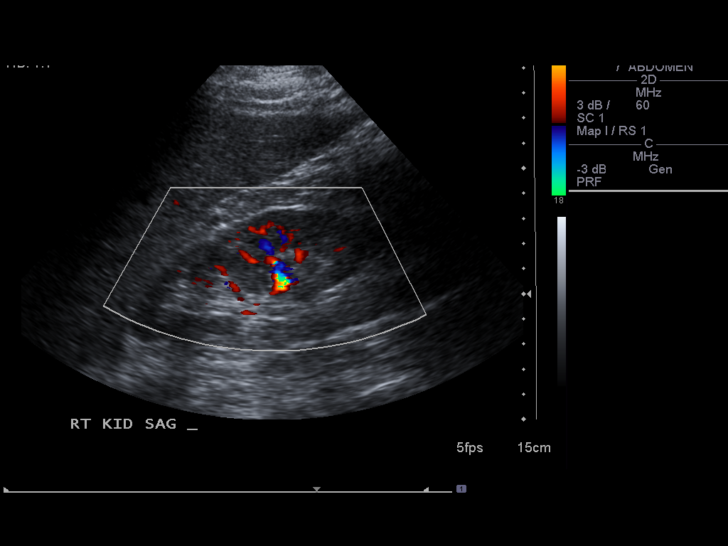
[im 53/79]
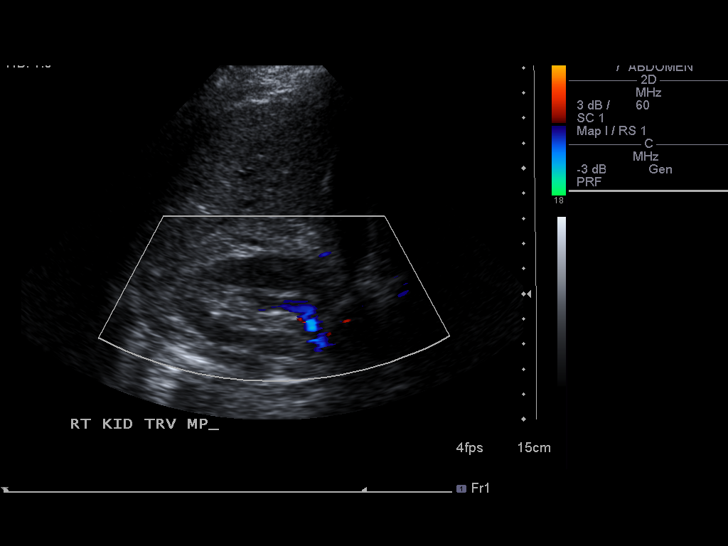
[im 59/79]
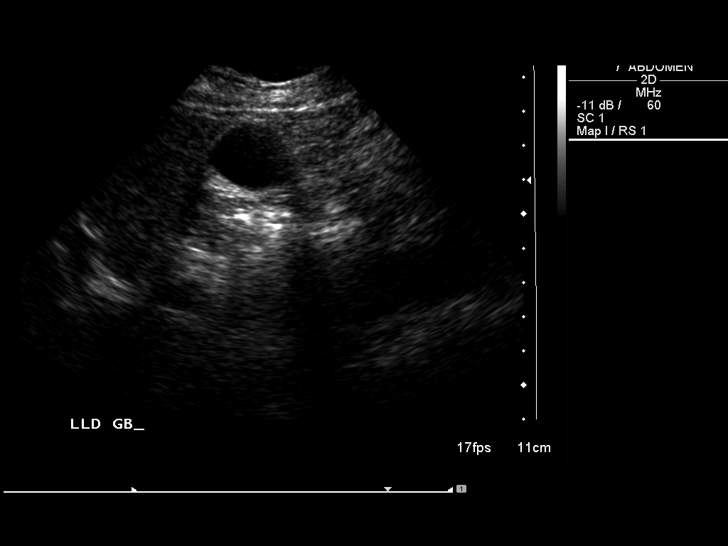
[im 66/79]
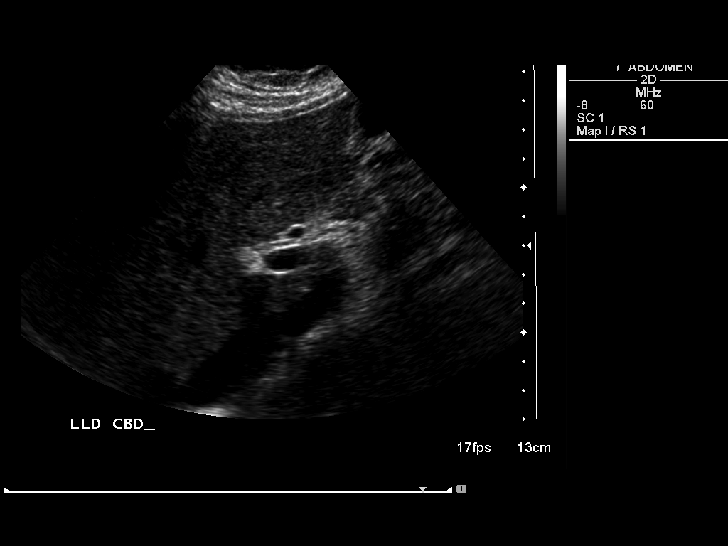
[im 72/79]
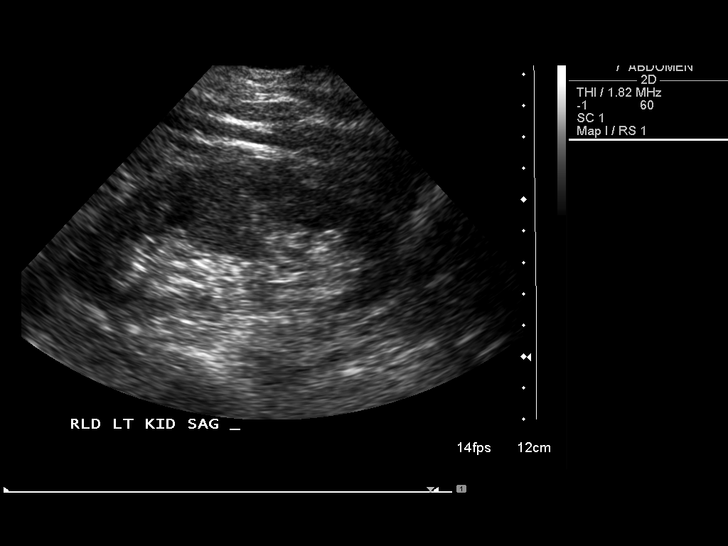
[im 79/79]
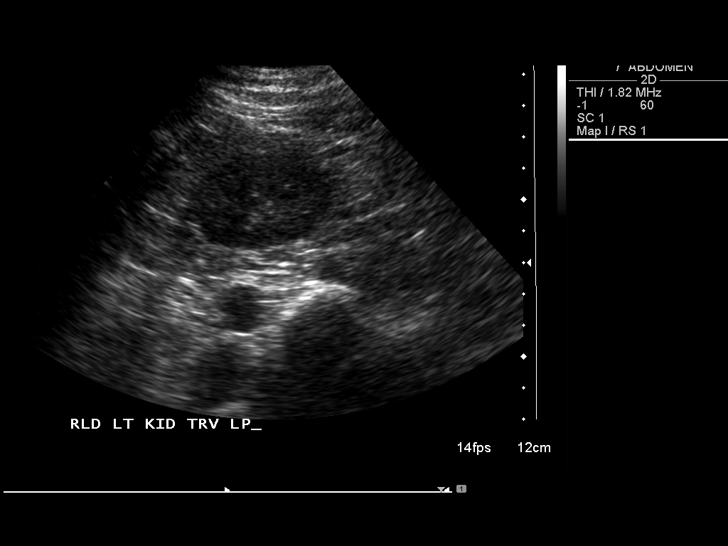

[14 of 25 positions shown; findings below may reference images not displayed]

FINDINGS: Gallbladder:  Phrygian cap noted.  Normally distended without
stones or wall thickening.  No sonographic Murphy's sign.

Common bile duct:  2 mm diameter, normal.

Liver:  Central cyst adjacent to porta hepatis, 2.3 x 2.0 x 2.1 cm.
Remainder normal appearance.

IVC:  Unremarkable

Pancreas:  Normal appearance

Spleen:  Normal appearance, 6 cm length.

Right Kidney:  10.8 cm length.  Normal morphology without mass or
hydronephrosis.

Left Kidney:  10.7 cm length.  Prominent central column of Bertin.
No mass or hydronephrosis.

Abdominal aorta:  Unremarkable

No free fluid
IMPRESSION: Central hepatic cyst 2.3 cm diameter.
Remainder exam unremarkable.

## 2009-05-01 ENCOUNTER — Other Ambulatory Visit: Admission: RE | Admit: 2009-05-01 | Discharge: 2009-05-01 | Payer: Self-pay | Admitting: Family Medicine

## 2009-05-01 ENCOUNTER — Encounter: Payer: Self-pay | Admitting: Family Medicine

## 2009-05-01 ENCOUNTER — Ambulatory Visit: Payer: Self-pay | Admitting: Family Medicine

## 2009-05-01 DIAGNOSIS — N63 Unspecified lump in unspecified breast: Secondary | ICD-10-CM | POA: Insufficient documentation

## 2009-05-01 DIAGNOSIS — N760 Acute vaginitis: Secondary | ICD-10-CM | POA: Insufficient documentation

## 2009-05-02 ENCOUNTER — Encounter: Payer: Self-pay | Admitting: Family Medicine

## 2009-05-02 LAB — CONVERTED CEMR LAB
Chlamydia, DNA Probe: NEGATIVE
GC Probe Amp, Genital: NEGATIVE

## 2009-05-05 ENCOUNTER — Encounter (INDEPENDENT_AMBULATORY_CARE_PROVIDER_SITE_OTHER): Payer: Self-pay | Admitting: *Deleted

## 2009-05-06 ENCOUNTER — Encounter (HOSPITAL_COMMUNITY): Admission: RE | Admit: 2009-05-06 | Discharge: 2009-06-05 | Payer: Self-pay | Admitting: Family Medicine

## 2009-05-06 LAB — CONVERTED CEMR LAB
Candida species: NEGATIVE
Gardnerella vaginalis: POSITIVE — AB

## 2009-05-06 IMAGING — NM NM HEPATO W/GB/PHARM/[PERSON_NAME]
2 series · 12 of 12 positions shown · non-contrast
Comparison: Ultrasound [DATE]

CLINICAL DATA: Abdominal pain

NUCLEAR MEDICINE HEPATOBILIARY IMAGING WITH GALLBLADDER EF
TECHNIQUE: Sequential images of the abdomen were obtained [DATE]
minutes following intravenous administration of
radiopharmaceutical.  After slow intravenous infusion of 1.6 uCg
Cholecystokinin, gallbladder ejection fraction was determined.
Radiopharmaceutical:  5.5 mCi [F2] Choletec

[Series 1: hida · 3.20mm/px · 6 of 30 frames shown (1 of 2)]
[frame 3/30]
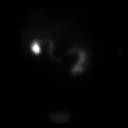
[frame 8/30]
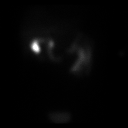
[frame 13/30]
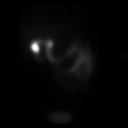
[frame 18/30]
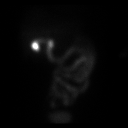
[frame 23/30]
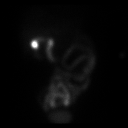
[frame 28/30]
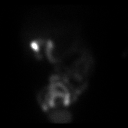

[Series 1: hida · 3.20mm/px · 6 of 60 frames shown (2 of 2)]
[frame 6/60]
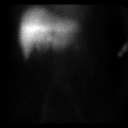
[frame 16/60]
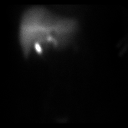
[frame 26/60]
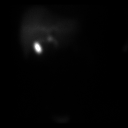
[frame 36/60]
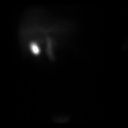
[frame 46/60]
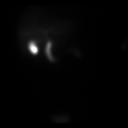
[frame 56/60]
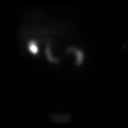

[12 of 12 positions shown; findings below may reference images not displayed]

FINDINGS: There is normal uptake by the liver and normal filling of
the gallbladder.  The gallbladder empties into the small bowel.

Gallbladder ejection fraction is calculated at 50% which is within
normal limits.

The patient did not experience symptoms during CCK infusion.
IMPRESSION: Normal gallbladder uptake and normal gallbladder ejection fraction.

## 2009-05-18 ENCOUNTER — Telehealth: Payer: Self-pay | Admitting: Family Medicine

## 2009-05-28 ENCOUNTER — Encounter (INDEPENDENT_AMBULATORY_CARE_PROVIDER_SITE_OTHER): Payer: Self-pay | Admitting: General Surgery

## 2009-05-28 ENCOUNTER — Ambulatory Visit (HOSPITAL_COMMUNITY): Admission: RE | Admit: 2009-05-28 | Discharge: 2009-05-28 | Payer: Self-pay | Admitting: General Surgery

## 2009-06-01 ENCOUNTER — Telehealth: Payer: Self-pay | Admitting: Family Medicine

## 2009-08-04 ENCOUNTER — Ambulatory Visit: Payer: Self-pay | Admitting: Family Medicine

## 2009-08-04 DIAGNOSIS — D649 Anemia, unspecified: Secondary | ICD-10-CM | POA: Insufficient documentation

## 2009-08-10 ENCOUNTER — Telehealth: Payer: Self-pay | Admitting: Family Medicine

## 2009-08-11 LAB — CONVERTED CEMR LAB: Retic Ct Pct: 1.4 % (ref 0.4–3.1)

## 2009-08-26 ENCOUNTER — Ambulatory Visit (HOSPITAL_COMMUNITY): Admission: RE | Admit: 2009-08-26 | Discharge: 2009-08-26 | Payer: Self-pay | Admitting: Family Medicine

## 2009-09-01 ENCOUNTER — Ambulatory Visit: Payer: Self-pay | Admitting: Family Medicine

## 2009-09-01 DIAGNOSIS — M129 Arthropathy, unspecified: Secondary | ICD-10-CM | POA: Insufficient documentation

## 2009-09-01 DIAGNOSIS — R52 Pain, unspecified: Secondary | ICD-10-CM | POA: Insufficient documentation

## 2009-09-07 ENCOUNTER — Telehealth: Payer: Self-pay | Admitting: Family Medicine

## 2009-09-10 ENCOUNTER — Encounter: Payer: Self-pay | Admitting: Family Medicine

## 2009-09-29 ENCOUNTER — Telehealth: Payer: Self-pay | Admitting: Family Medicine

## 2009-09-29 ENCOUNTER — Encounter: Payer: Self-pay | Admitting: Family Medicine

## 2009-10-22 ENCOUNTER — Ambulatory Visit: Payer: Self-pay | Admitting: Family Medicine

## 2009-10-22 DIAGNOSIS — G5600 Carpal tunnel syndrome, unspecified upper limb: Secondary | ICD-10-CM | POA: Insufficient documentation

## 2009-10-22 DIAGNOSIS — G4730 Sleep apnea, unspecified: Secondary | ICD-10-CM | POA: Insufficient documentation

## 2009-10-22 DIAGNOSIS — R220 Localized swelling, mass and lump, head: Secondary | ICD-10-CM | POA: Insufficient documentation

## 2009-10-22 DIAGNOSIS — R221 Localized swelling, mass and lump, neck: Secondary | ICD-10-CM

## 2009-10-22 DIAGNOSIS — R0989 Other specified symptoms and signs involving the circulatory and respiratory systems: Secondary | ICD-10-CM | POA: Insufficient documentation

## 2009-10-22 DIAGNOSIS — M79609 Pain in unspecified limb: Secondary | ICD-10-CM | POA: Insufficient documentation

## 2009-10-22 LAB — CONVERTED CEMR LAB: Retic Ct Pct: 1.1 % (ref 0.4–3.1)

## 2009-10-28 ENCOUNTER — Ambulatory Visit (HOSPITAL_COMMUNITY): Admission: RE | Admit: 2009-10-28 | Discharge: 2009-10-28 | Payer: Self-pay | Admitting: Family Medicine

## 2009-10-28 IMAGING — US US CAROTID DUPLEX BILAT
1 series · 13 of 24 positions shown · non-contrast
Comparison: None.

CLINICAL DATA: Asymptomatic carotid bruit, dizziness, hypertension

BILATERAL CAROTID DUPLEX ULTRASOUND
TECHNIQUE: Gray scale imaging, color Doppler and duplex ultrasound
was performed of bilateral carotid and vertebral arteries in the
neck.

[Series 1: us carotid duplex bilat · 0.08mm/px · 13 of 56 slices shown]
[im 1/56]
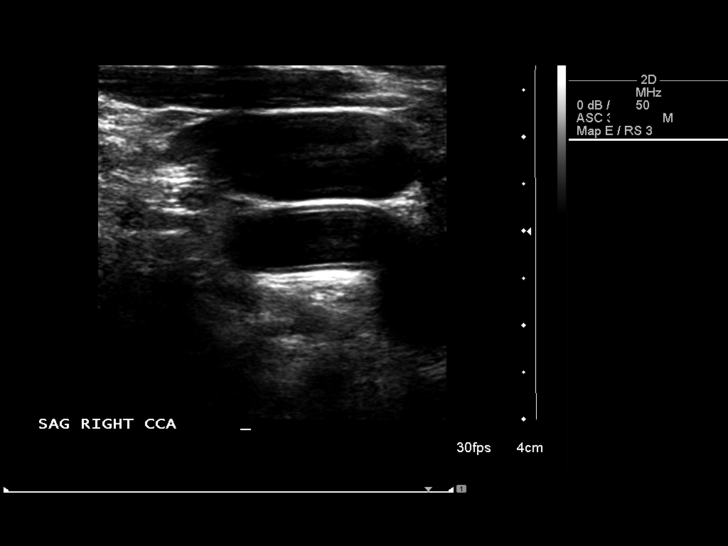
[im 5/56]
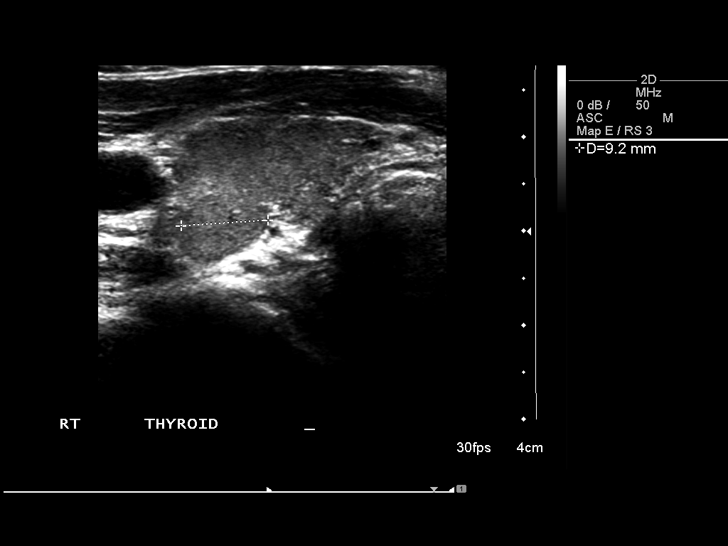
[im 10/56]
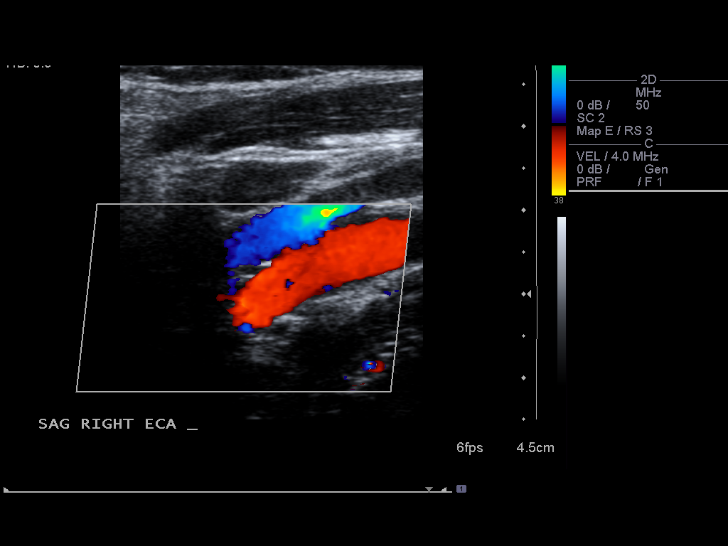
[im 15/56]
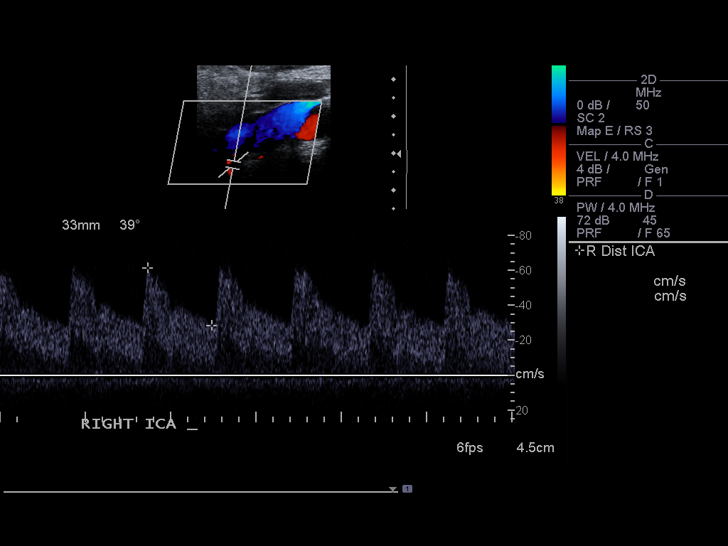
[im 20/56]
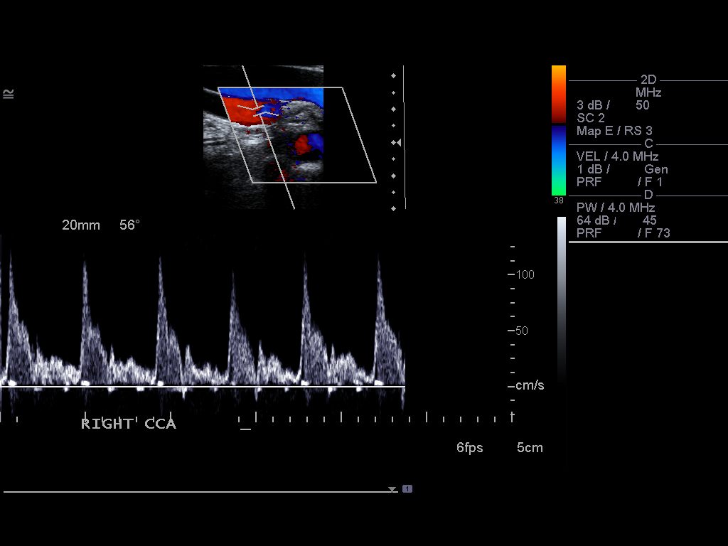
[im 24/56]
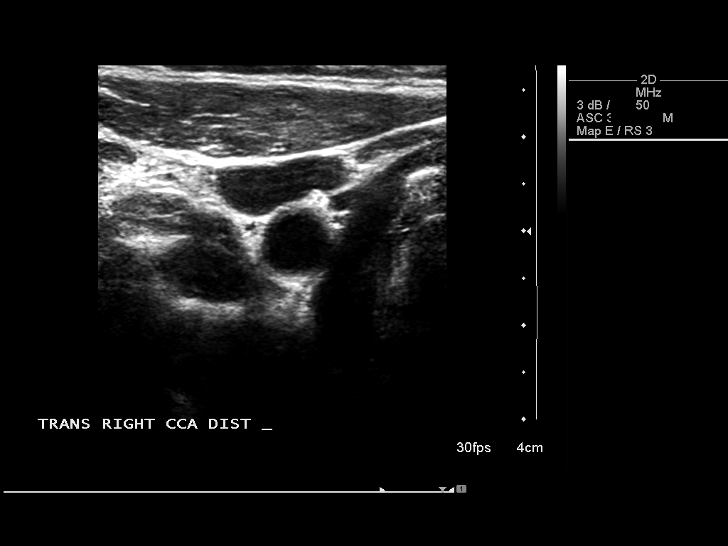
[im 29/56]
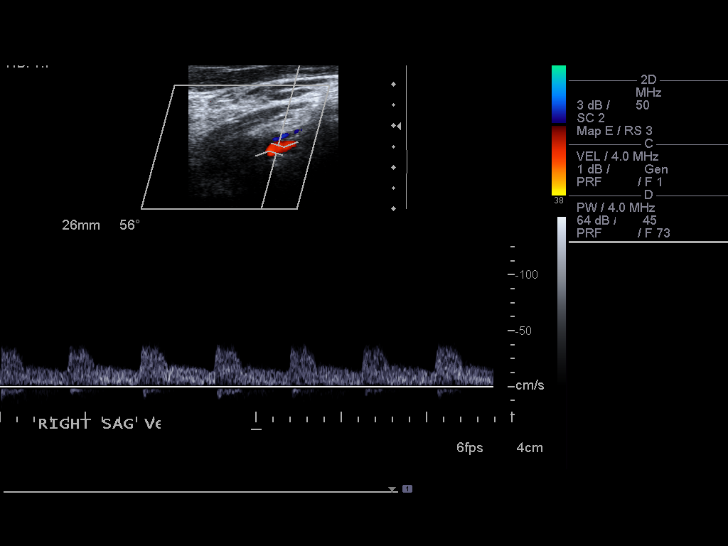
[im 32/56]
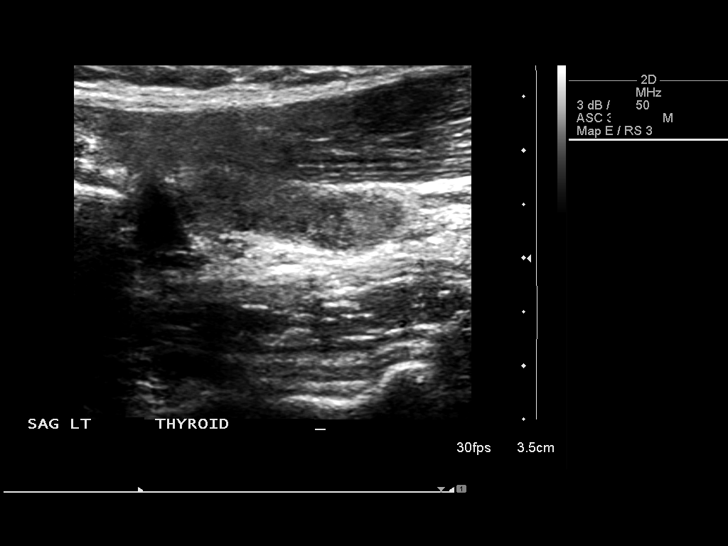
[im 36/56]
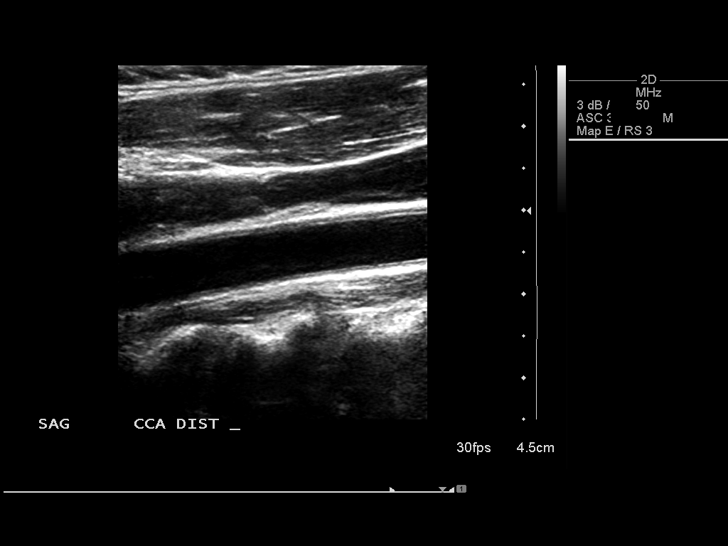
[im 41/56]
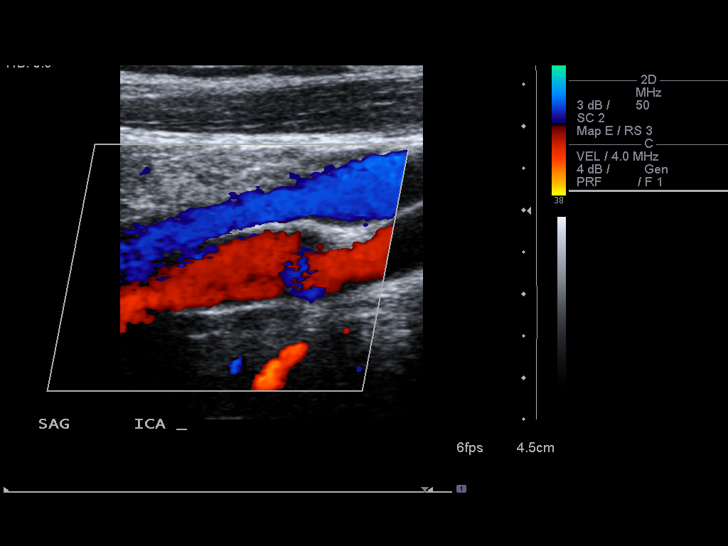
[im 46/56]
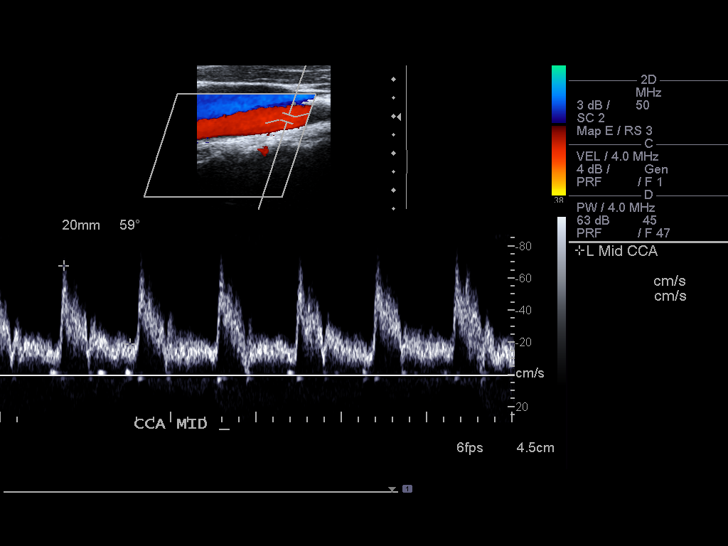
[im 51/56]
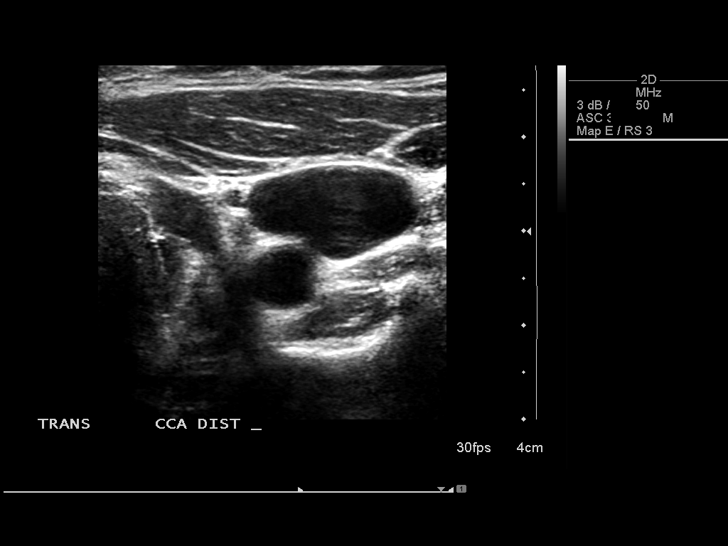
[im 56/56]
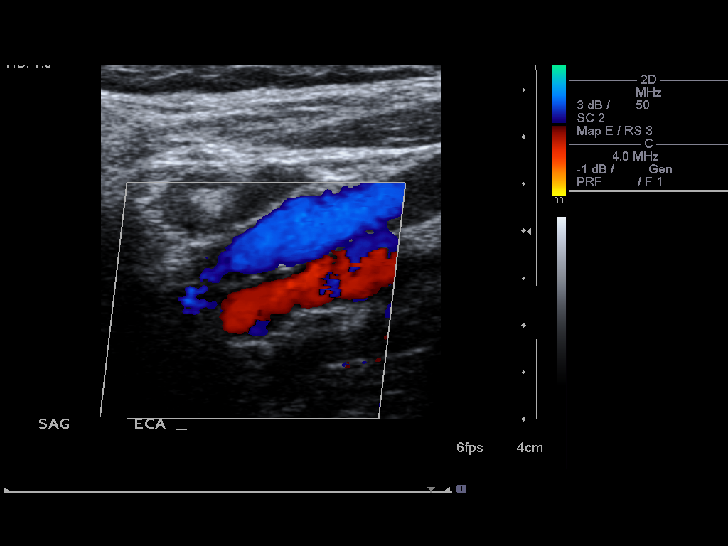

[13 of 24 positions shown; findings below may reference images not displayed]

Criteria:  Quantification of carotid stenosis is based on velocity
parameters that correlate the residual internal carotid diameter
with NASCET-based stenosis levels, using the diameter of the distal
internal carotid lumen as the denominator for stenosis measurement.

The following velocity measurements were obtained:

                 PEAK SYSTOLIC/END DIASTOLIC
RIGHT
ICA:                        65/28cm/sec
CCA:                        80/20cm/sec
SYSTOLIC ICA/CCA RATIO:
DIASTOLIC ICA/CCA RATIO:
ECA:                        77/13cm/sec

LEFT
ICA:                        73/32cm/sec
CCA:                        68/20cm/sec
SYSTOLIC ICA/CCA RATIO:
DIASTOLIC ICA/CCA RATIO:
ECA:                        77/13cm/sec
FINDINGS: RIGHT CAROTID ARTERY: No significant right carotid bifurcation
atherosclerosis or plaque formation.  No hemodynamically
significant ICA stenosis, velocity elevation, or turbulent flow.

RIGHT VERTEBRAL ARTERY:  Antegrade

LEFT CAROTID ARTERY: No significant left carotid bifurcation plaque
formation.  No hemodynamically significant ICA stenosis, velocity
elevation, or turbulent flow.

LEFT VERTEBRAL ARTERY:  Antegrade
IMPRESSION: No significant carotid atherosclerosis or ICA stenosis detected by
ultrasound.

## 2009-10-28 IMAGING — US US SOFT TISSUE HEAD/NECK
1 series · 14 of 25 positions shown · non-contrast
Comparison: None

CLINICAL DATA: Swelling.  Bruit.

THYROID ULTRASOUND
TECHNIQUE: Ultrasound examination of the thyroid gland and
adjacent soft tissues was performed.

[Series 1: us soft tissue head/neck · 0.09mm/px · 14 of 49 slices shown]
[im 1/49]
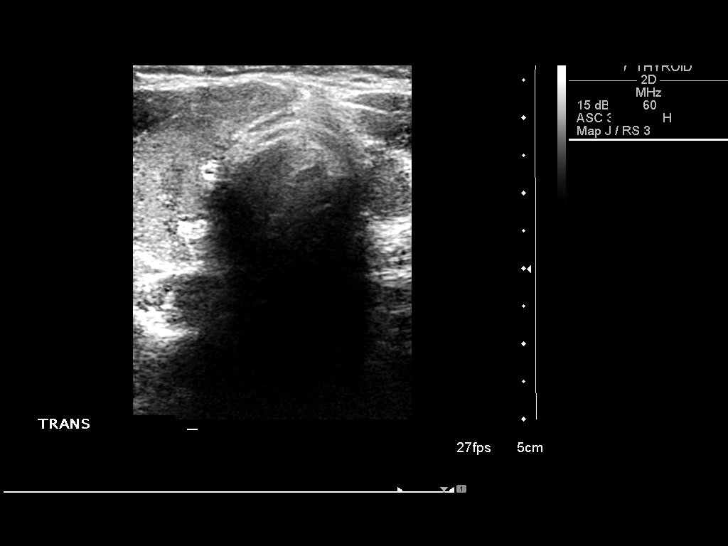
[im 5/49]
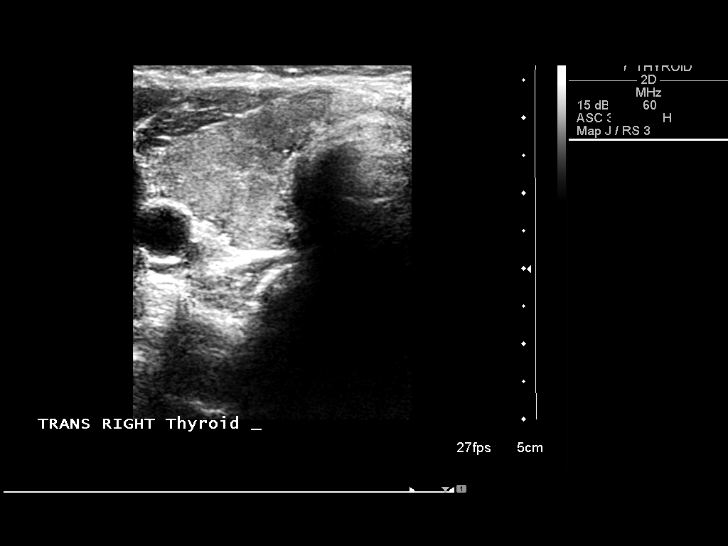
[im 9/49]
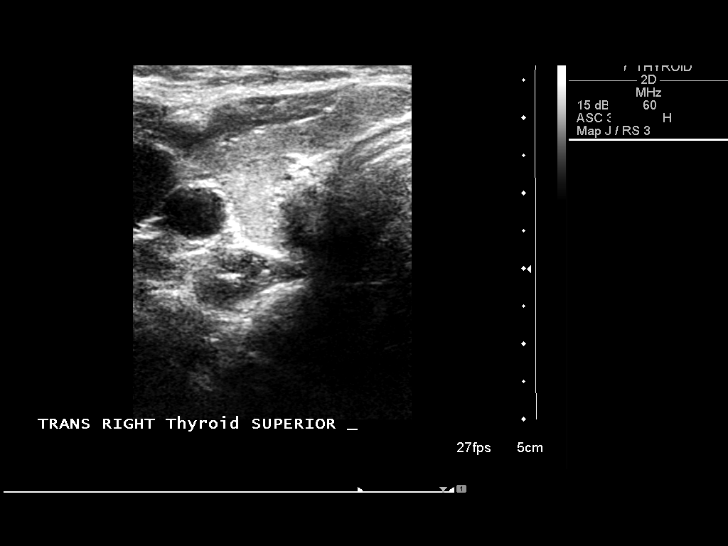
[im 13/49]
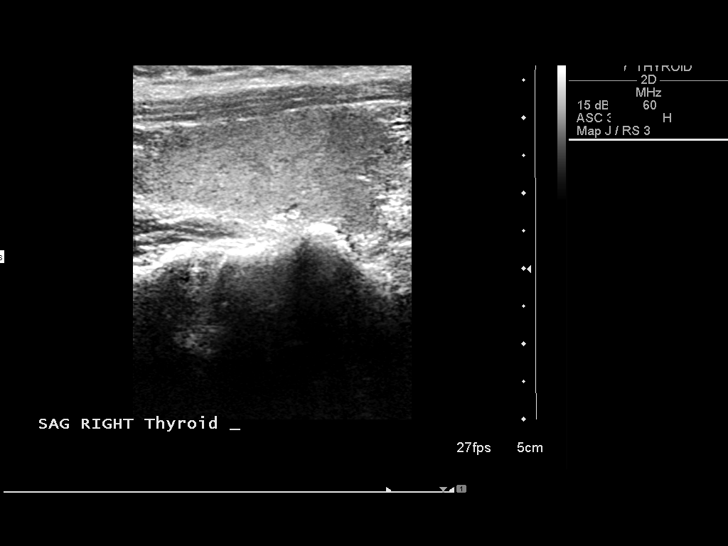
[im 17/49]
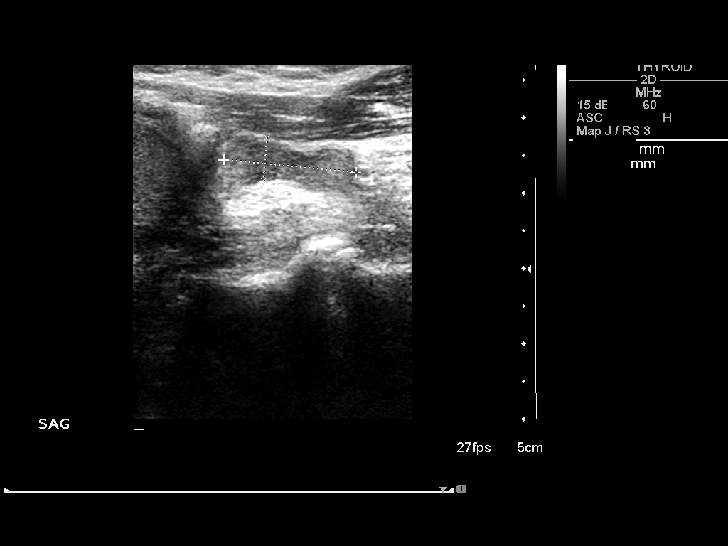
[im 19/49]
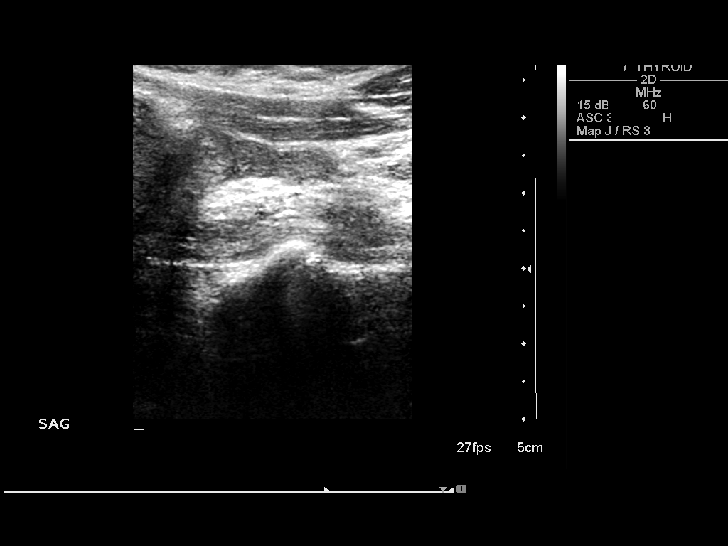
[im 23/49]
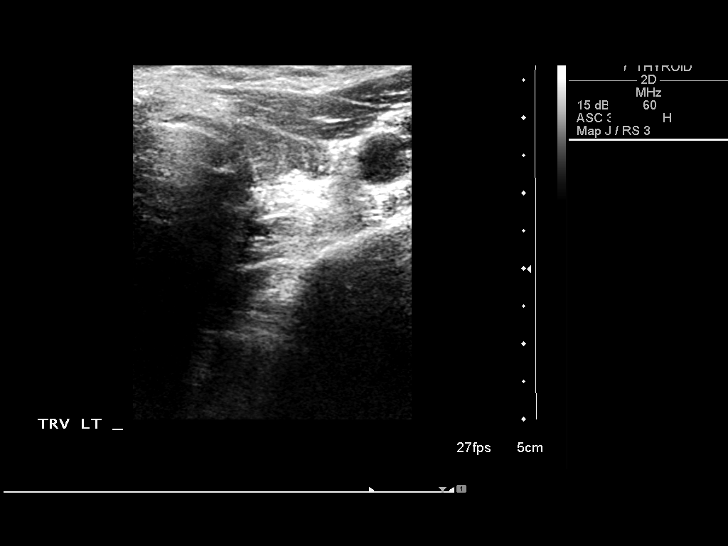
[im 27/49]
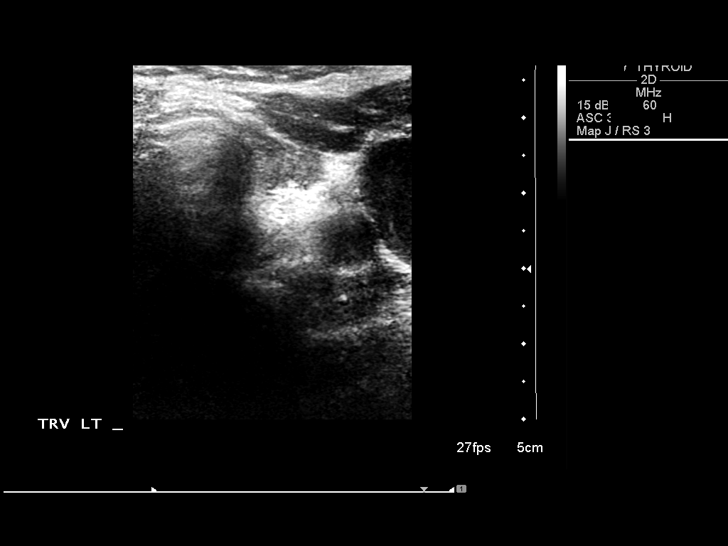
[im 31/49]
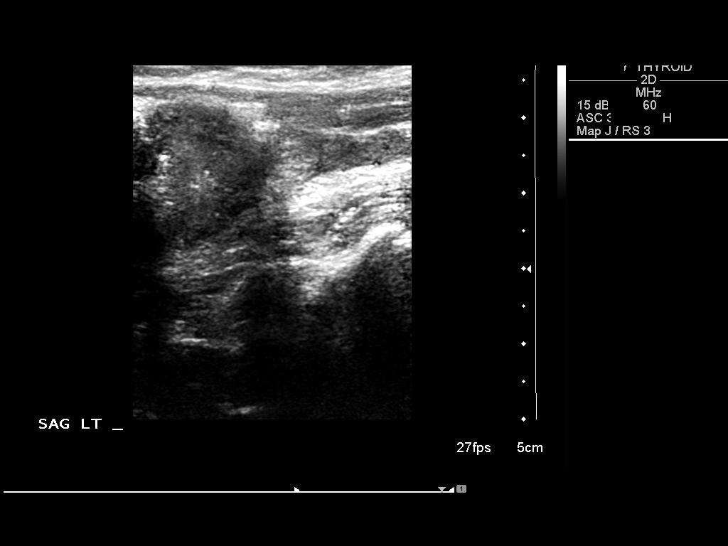
[im 33/49]
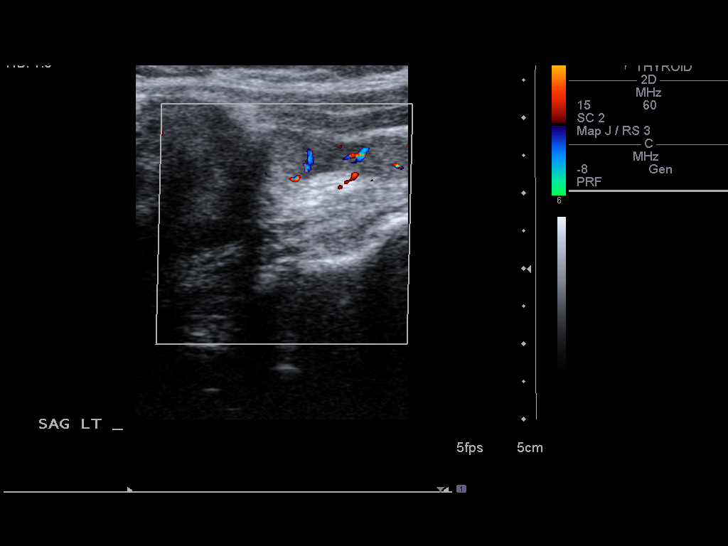
[im 37/49]
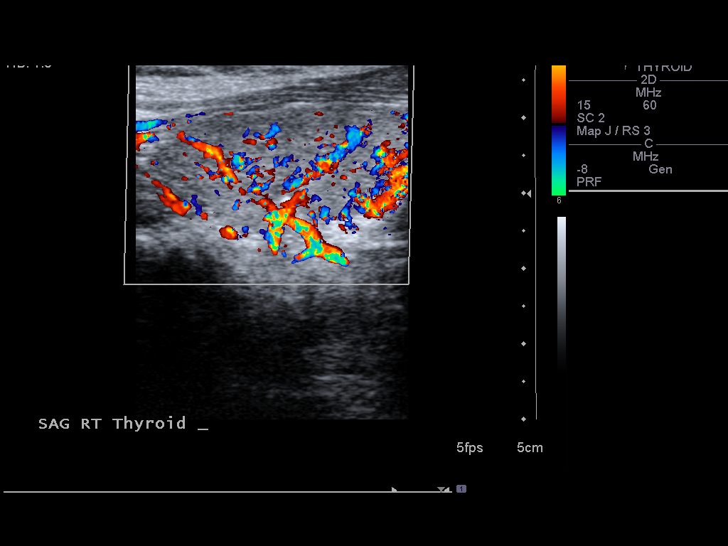
[im 41/49]
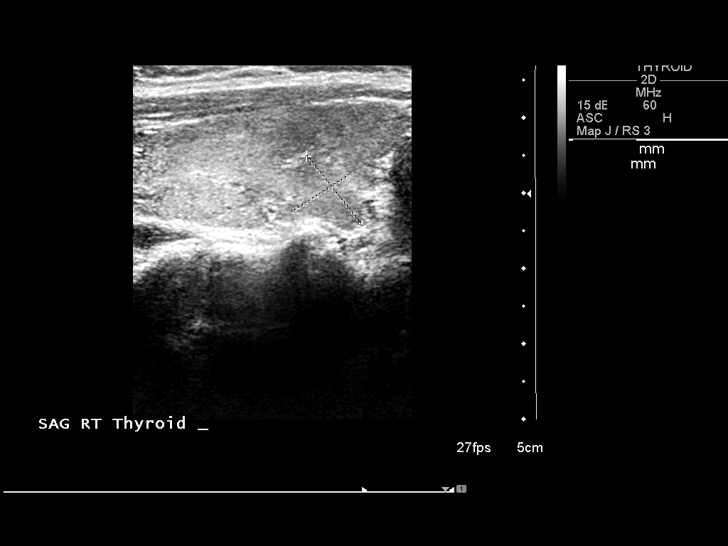
[im 45/49]
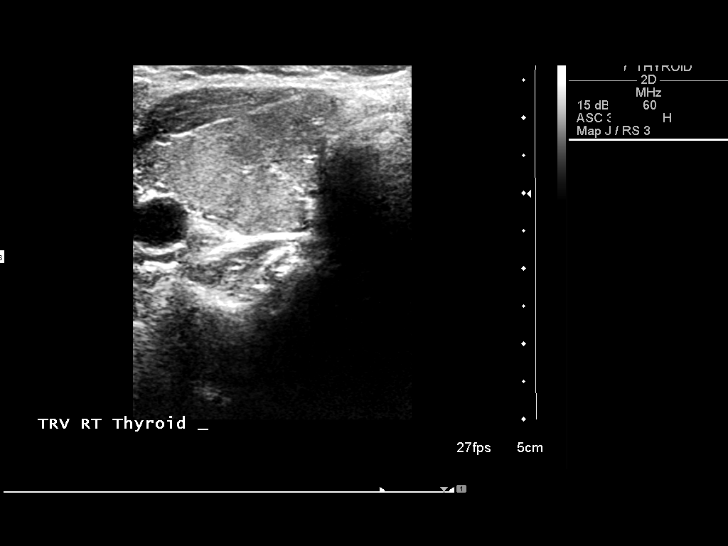
[im 49/49]
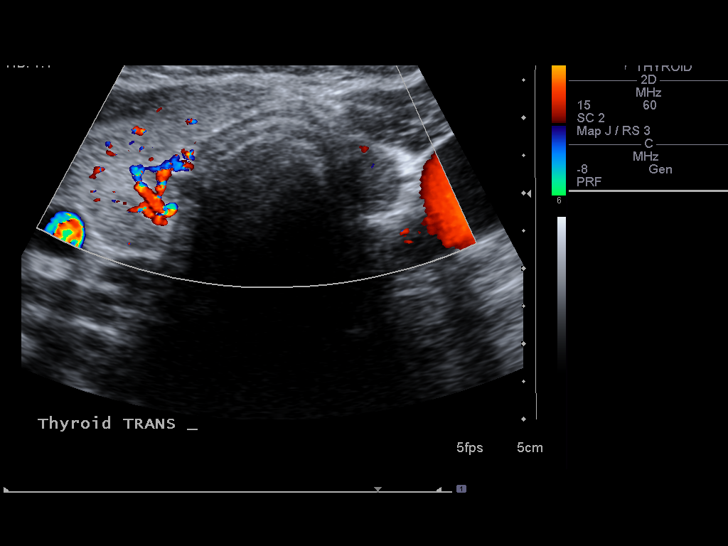

[14 of 25 positions shown; findings below may reference images not displayed]

FINDINGS: Left thyroid lobe reportedly removed.  I believe I can
identify small amount of left thyroid tissue.  Isoechoic nodule
medial aspect of the inferior pole of the right lobe measuring
x 0.7 x 0.9 cm.
IMPRESSION: Findings compatible with partial left thyroidectomy.  Right thyroid
nodule.

## 2009-11-02 ENCOUNTER — Encounter: Payer: Self-pay | Admitting: Family Medicine

## 2009-11-03 ENCOUNTER — Ambulatory Visit (HOSPITAL_COMMUNITY): Admission: RE | Admit: 2009-11-03 | Discharge: 2009-11-03 | Payer: Self-pay | Admitting: Family Medicine

## 2009-11-03 ENCOUNTER — Ambulatory Visit: Payer: Self-pay | Admitting: Family Medicine

## 2009-11-03 LAB — CONVERTED CEMR LAB: OCCULT 1: NEGATIVE

## 2009-11-03 IMAGING — CR DG ABDOMEN 2V
2 series · 2 of 2 positions shown · non-contrast
Comparison: None

CLINICAL DATA: Abdominal pain, hiatal hernia

ABDOMEN - 2 VIEW

[view not recorded (1 of 2)]
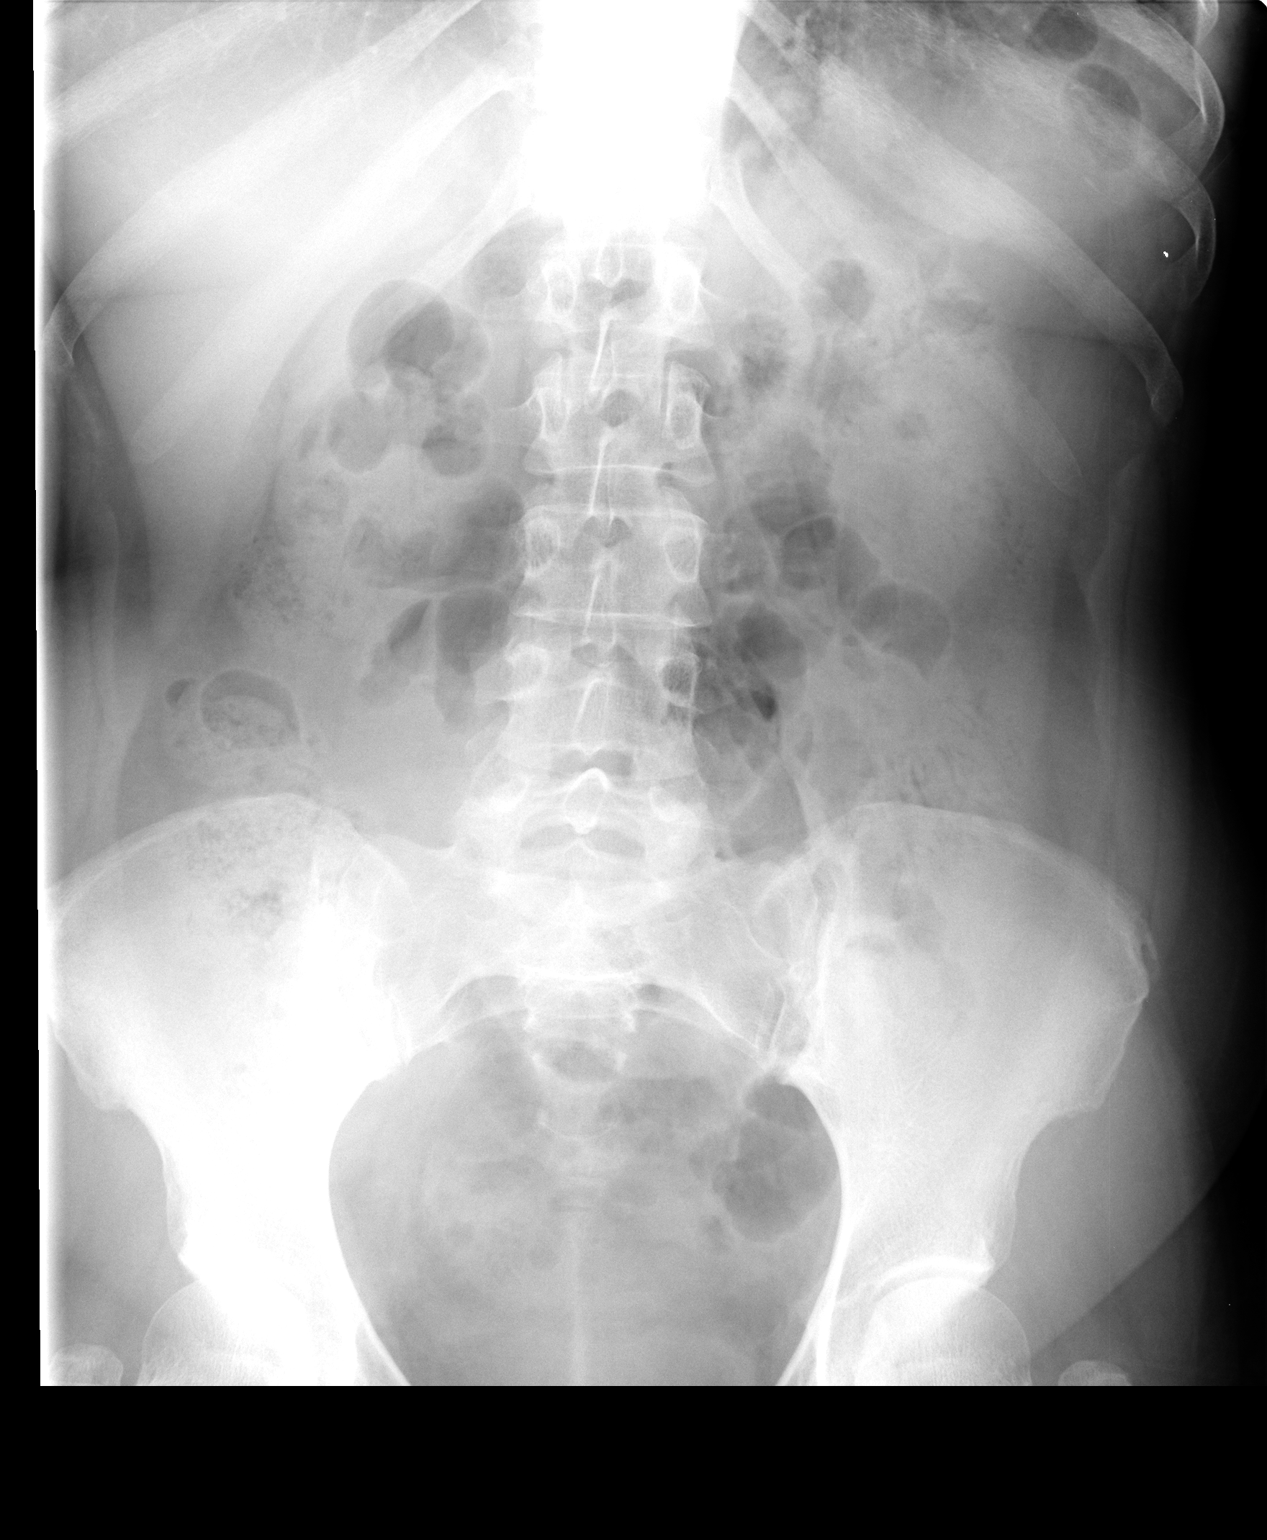

[view not recorded (2 of 2)]
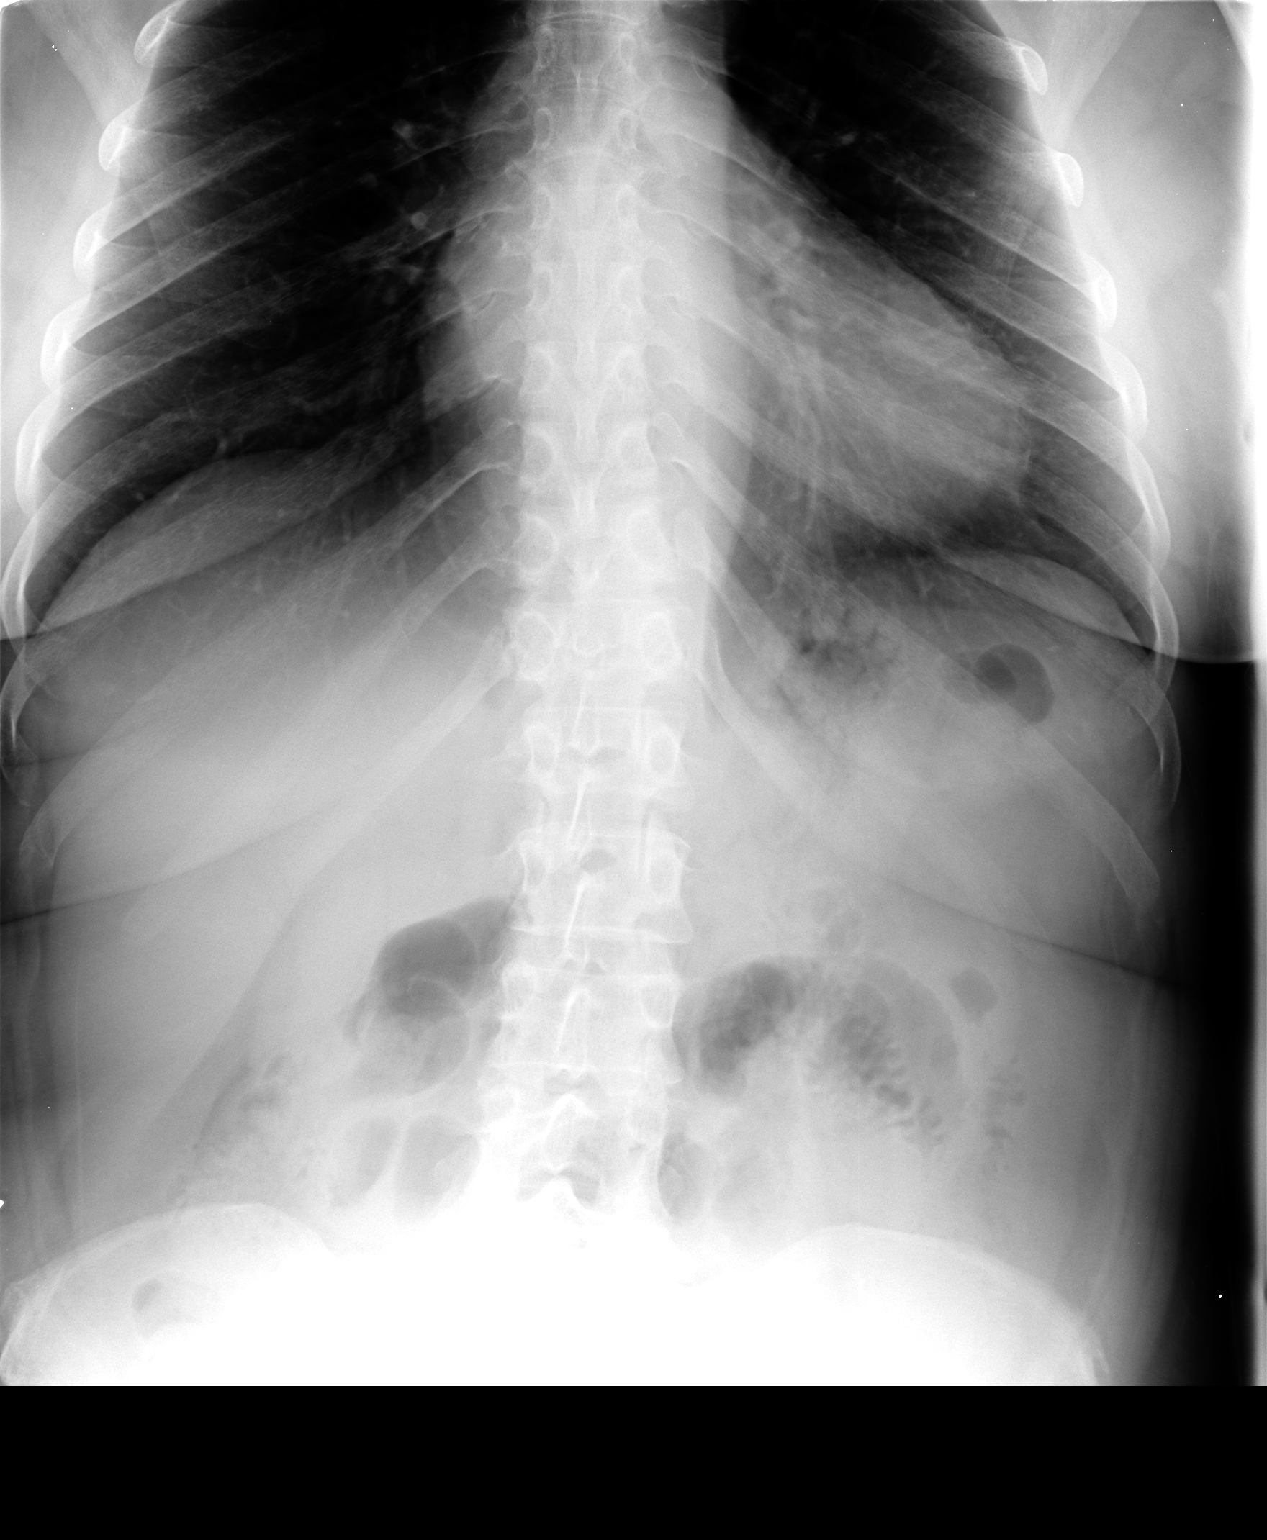

[2 of 2 positions shown; findings below may reference images not displayed]

FINDINGS: Nonspecific bowel gas pattern.
No bowel dilatation, bowel wall thickening, or free intraperitoneal
air.
Small right pelvic phleboliths.
No definite urinary tract calcification.
Bones unremarkable.
Lung bases clear.
IMPRESSION: Nonspecific bowel gas pattern.

## 2009-12-09 ENCOUNTER — Encounter (HOSPITAL_COMMUNITY): Admission: RE | Admit: 2009-12-09 | Discharge: 2010-01-08 | Payer: Self-pay | Admitting: Orthopedic Surgery

## 2009-12-15 ENCOUNTER — Ambulatory Visit: Payer: Self-pay | Admitting: Gastroenterology

## 2009-12-15 DIAGNOSIS — R1013 Epigastric pain: Secondary | ICD-10-CM | POA: Insufficient documentation

## 2009-12-21 ENCOUNTER — Ambulatory Visit: Payer: Self-pay | Admitting: Family Medicine

## 2009-12-21 ENCOUNTER — Encounter: Payer: Self-pay | Admitting: Physician Assistant

## 2009-12-21 DIAGNOSIS — J1189 Influenza due to unidentified influenza virus with other manifestations: Secondary | ICD-10-CM | POA: Insufficient documentation

## 2009-12-29 HISTORY — PX: ESOPHAGOGASTRODUODENOSCOPY: SHX1529

## 2010-01-01 ENCOUNTER — Ambulatory Visit (HOSPITAL_COMMUNITY): Admission: RE | Admit: 2010-01-01 | Discharge: 2010-01-01 | Payer: Self-pay | Admitting: Gastroenterology

## 2010-01-01 ENCOUNTER — Ambulatory Visit: Payer: Self-pay | Admitting: Gastroenterology

## 2010-01-04 ENCOUNTER — Telehealth (INDEPENDENT_AMBULATORY_CARE_PROVIDER_SITE_OTHER): Payer: Self-pay

## 2010-01-07 ENCOUNTER — Encounter (INDEPENDENT_AMBULATORY_CARE_PROVIDER_SITE_OTHER): Payer: Self-pay

## 2010-01-13 ENCOUNTER — Ambulatory Visit: Payer: Self-pay | Admitting: Family Medicine

## 2010-01-13 DIAGNOSIS — M25469 Effusion, unspecified knee: Secondary | ICD-10-CM | POA: Insufficient documentation

## 2010-02-05 LAB — CONVERTED CEMR LAB
AST: 14 units/L (ref 0–37)
Albumin: 4.1 g/dL (ref 3.5–5.2)
Alkaline Phosphatase: 76 units/L (ref 39–117)
Calcium: 9.7 mg/dL (ref 8.4–10.5)
Cholesterol: 196 mg/dL (ref 0–200)
Creatinine, Ser: 0.95 mg/dL (ref 0.40–1.20)
HDL: 61 mg/dL (ref >39)
Indirect Bilirubin: 0.3 mg/dL (ref 0.0–0.9)
Total Protein: 7.3 g/dL (ref 6.0–8.3)
Triglycerides: 117 mg/dL (ref <150)

## 2010-02-22 ENCOUNTER — Ambulatory Visit: Payer: Self-pay | Admitting: Family Medicine

## 2010-02-25 ENCOUNTER — Encounter: Payer: Self-pay | Admitting: Family Medicine

## 2010-03-11 ENCOUNTER — Ambulatory Visit: Payer: Self-pay | Admitting: Gastroenterology

## 2010-03-11 DIAGNOSIS — R141 Gas pain: Secondary | ICD-10-CM | POA: Insufficient documentation

## 2010-03-11 DIAGNOSIS — R143 Flatulence: Secondary | ICD-10-CM

## 2010-03-11 DIAGNOSIS — R142 Eructation: Secondary | ICD-10-CM

## 2010-05-06 LAB — CONVERTED CEMR LAB
ALT: 19 units/L (ref 0–35)
Alkaline Phosphatase: 73 units/L (ref 39–117)
BUN: 13 mg/dL (ref 6–23)
Bilirubin, Direct: 0.1 mg/dL (ref 0.0–0.3)
Cholesterol: 178 mg/dL (ref 0–200)
Creatinine, Ser: 0.9 mg/dL (ref 0.40–1.20)
Glucose, Bld: 88 mg/dL (ref 70–99)
Indirect Bilirubin: 0.3 mg/dL (ref 0.0–0.9)
LDL Cholesterol: 98 mg/dL (ref 0–99)
Total Protein: 7 g/dL (ref 6.0–8.3)
Triglycerides: 151 mg/dL — ABNORMAL HIGH (ref <150)

## 2010-05-11 ENCOUNTER — Ambulatory Visit: Payer: Self-pay | Admitting: Gastroenterology

## 2010-05-24 ENCOUNTER — Ambulatory Visit: Payer: Self-pay | Admitting: Family Medicine

## 2010-05-24 ENCOUNTER — Other Ambulatory Visit: Admission: RE | Admit: 2010-05-24 | Discharge: 2010-05-24 | Payer: Self-pay | Admitting: Family Medicine

## 2010-05-25 LAB — CONVERTED CEMR LAB: TSH: 5.135 microintl units/mL — ABNORMAL HIGH (ref 0.350–4.500)

## 2010-05-27 ENCOUNTER — Encounter: Payer: Self-pay | Admitting: Family Medicine

## 2010-05-27 LAB — CONVERTED CEMR LAB: Pap Smear: NEGATIVE

## 2010-06-14 ENCOUNTER — Ambulatory Visit: Payer: Self-pay | Admitting: Family Medicine

## 2010-06-22 ENCOUNTER — Ambulatory Visit: Payer: Self-pay | Admitting: Endocrinology

## 2010-06-22 DIAGNOSIS — E042 Nontoxic multinodular goiter: Secondary | ICD-10-CM | POA: Insufficient documentation

## 2010-06-22 DIAGNOSIS — E890 Postprocedural hypothyroidism: Secondary | ICD-10-CM | POA: Insufficient documentation

## 2010-06-25 ENCOUNTER — Ambulatory Visit (HOSPITAL_COMMUNITY): Admission: RE | Admit: 2010-06-25 | Discharge: 2010-06-25 | Payer: Self-pay | Admitting: Endocrinology

## 2010-06-25 IMAGING — US US SOFT TISSUE HEAD/NECK
1 series · 13 of 25 positions shown · non-contrast
Comparison: [DATE]

CLINICAL DATA: Right-sided thyroid nodule.  History of left-sided
thyroidectomy.

THYROID ULTRASOUND
TECHNIQUE: Ultrasound examination of the thyroid gland and adjacent
soft tissues was performed.

[Series 1: us soft tissue head/neck · 0.08mm/px · 45 acquisitions, 13 frames shown]
[im 1/45]
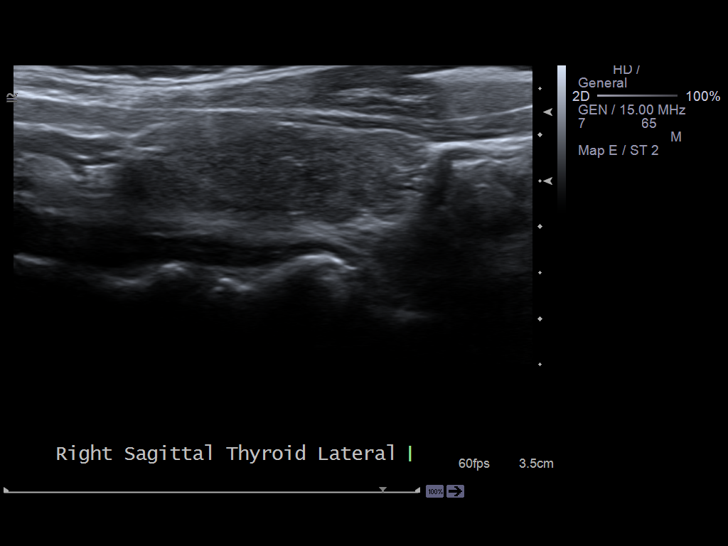
[im 4/45]
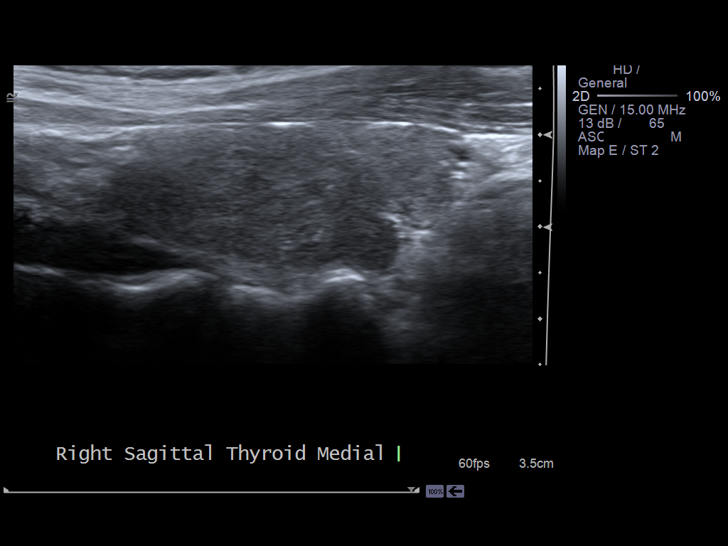
[im 8/45]
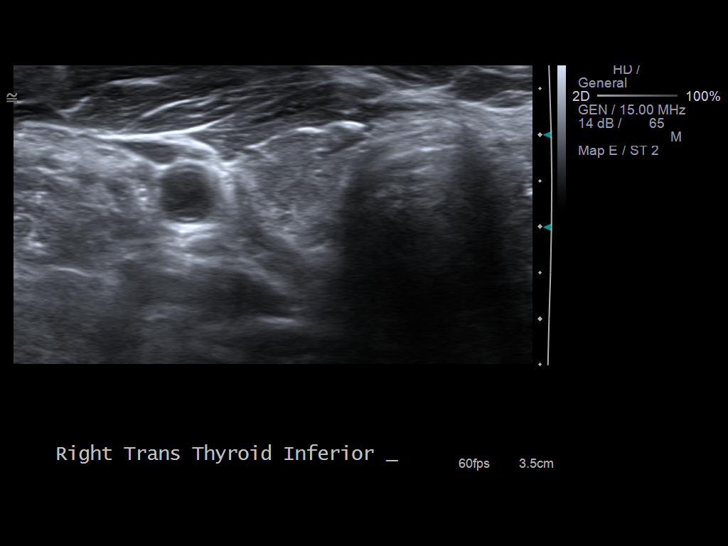
[im 12/45]
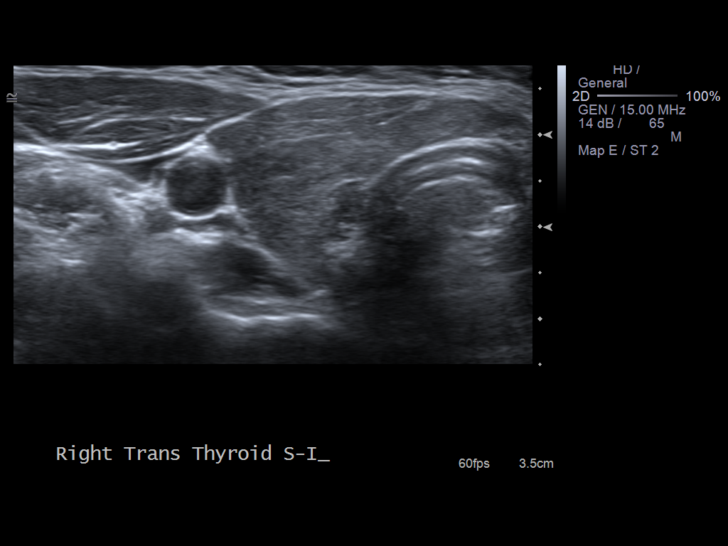
[im 15/45]
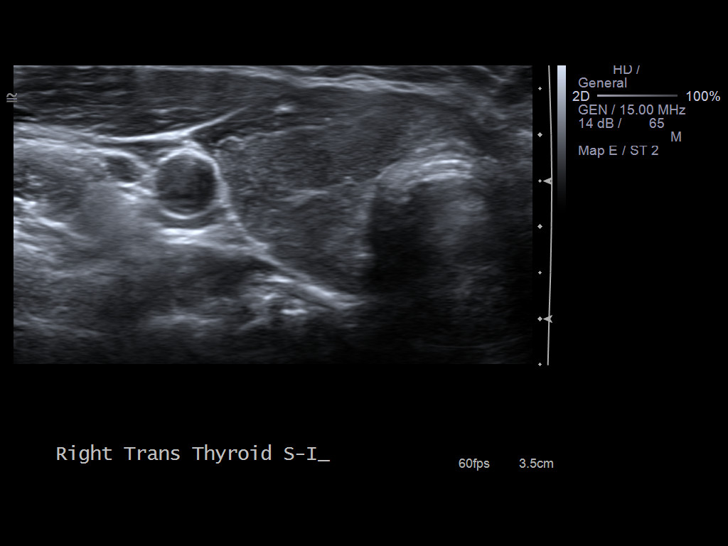
[im 19/45]
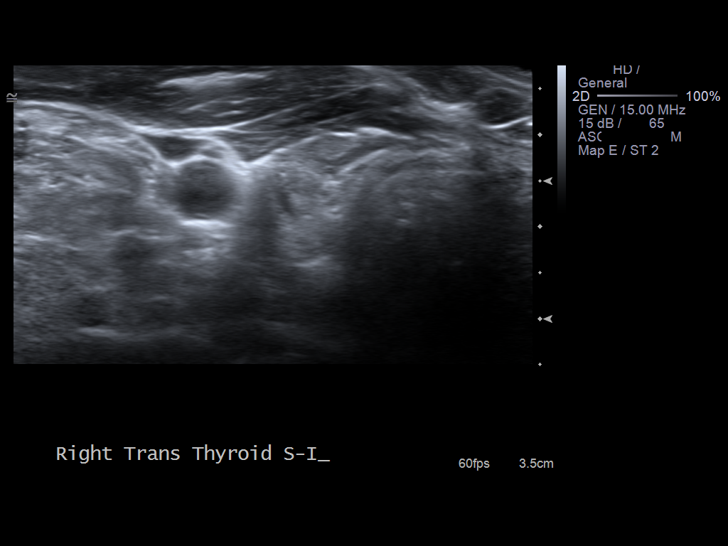
[im 23/45]
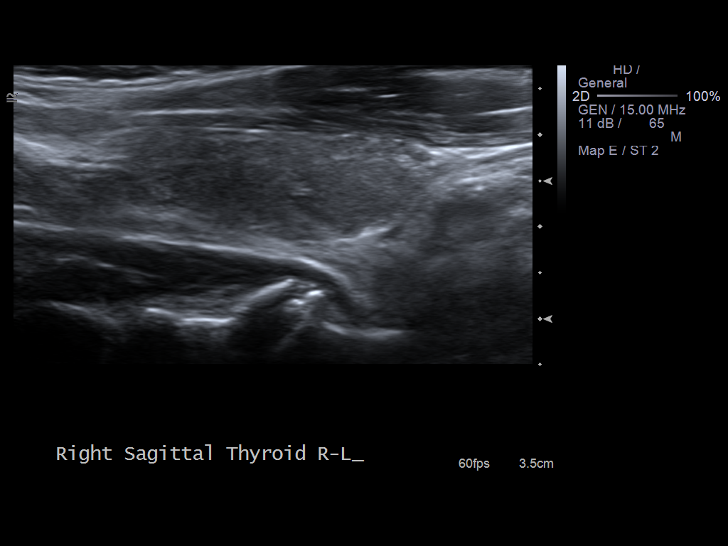
[im 26/45]
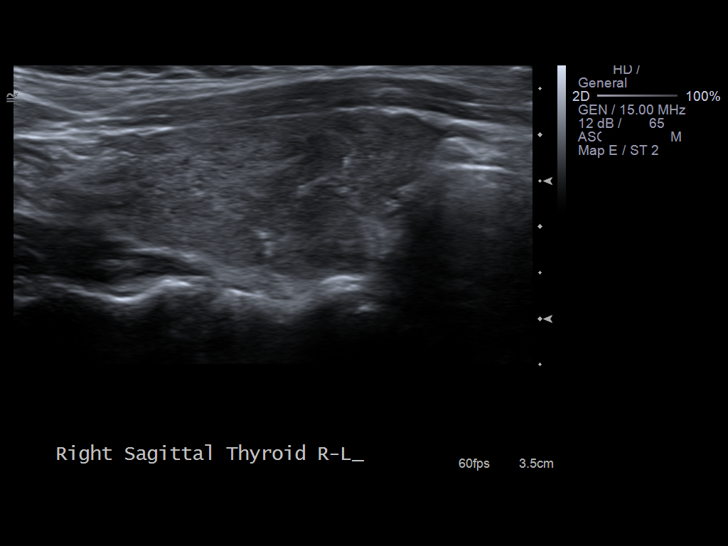
[im 30/45]
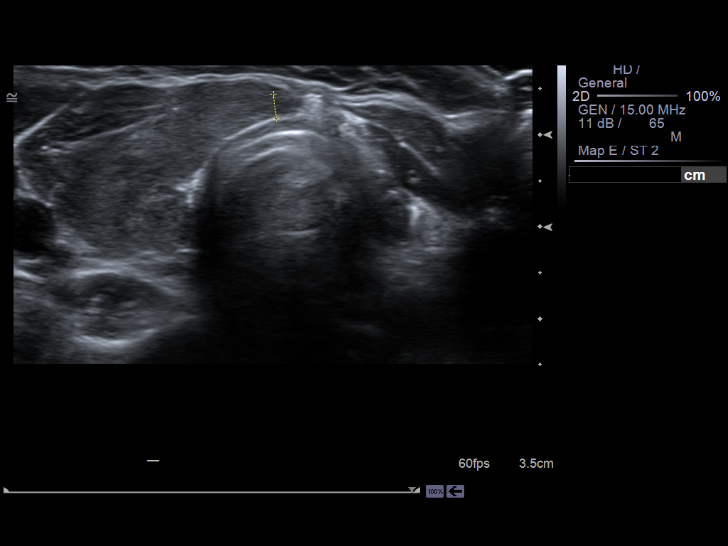
[im 34/45]
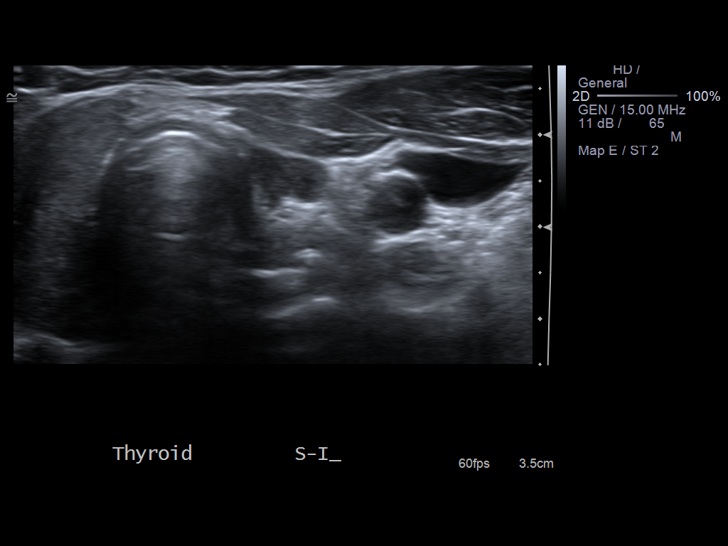
[im 37/45]
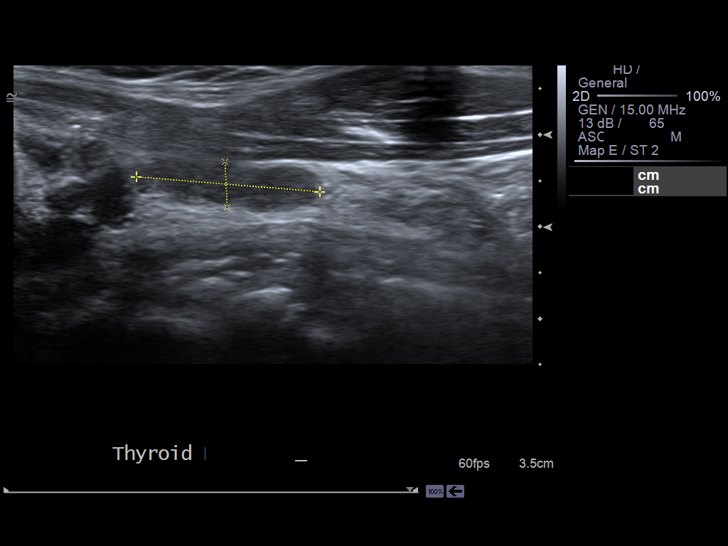
[im 41/45]
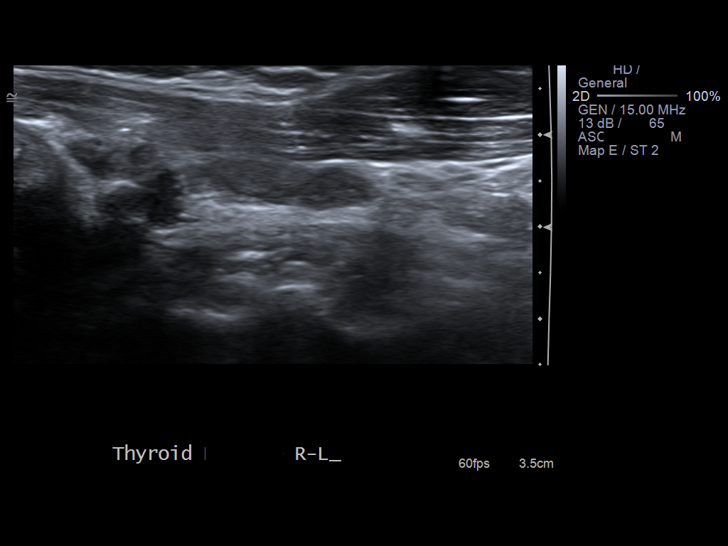
[im 45/45]
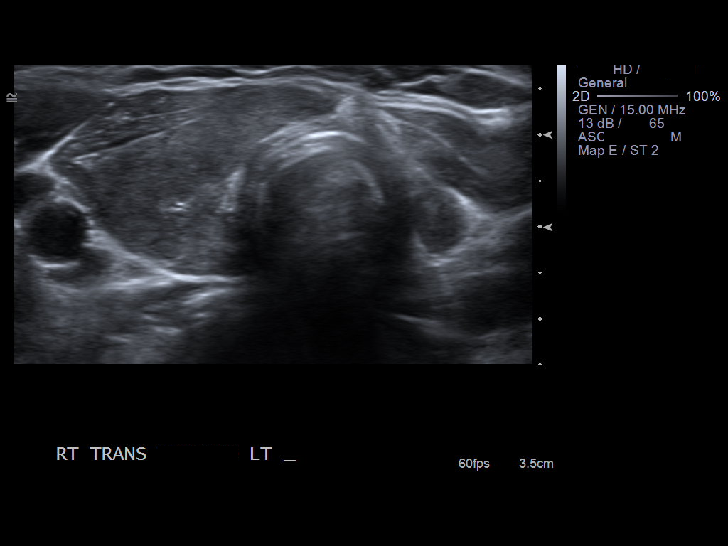

[13 of 25 positions shown; findings below may reference images not displayed]

FINDINGS: Right thyroid lobe:  3.9 x 1.5 x 1.4 cm.
Left thyroid lobe:  Per report, surgically absent.  There is an
area of soft tissue echogenicity within the expected location of
the left lobe.  This measures 2.0 x 0.5 x 0.8 cm.  This has a
similar morphology to on the prior exam, where soft tissue
echogenicity measures 1.8 x 0.6 x 1.1 cm.
Isthmus:  3 mm.

Focal nodules:  The previously described right-sided thyroid nodule
is not confidently identified.

Lymphadenopathy:  None visualized.
IMPRESSION: 1.  Lack of visualization of the previously described right-sided
thyroid nodule.
2.  Soft tissue echogenicity in the expected location of the left
lobe of the thyroid.  Per report, the thyroid is surgically absent.
This most likely represents residual thyroid tissue.  The
appearance is overall similar to on the prior exam.  If the partial
thyroidectomy was for malignancy, and there is a clinical concern
of locally recurrent disease, consider short-term follow-up
ultrasound versus nuclear medicine imaging.

## 2010-07-01 ENCOUNTER — Ambulatory Visit: Payer: Self-pay | Admitting: Family Medicine

## 2010-07-01 DIAGNOSIS — R42 Dizziness and giddiness: Secondary | ICD-10-CM | POA: Insufficient documentation

## 2010-07-01 DIAGNOSIS — I498 Other specified cardiac arrhythmias: Secondary | ICD-10-CM | POA: Insufficient documentation

## 2010-07-01 LAB — CONVERTED CEMR LAB: Troponin I: 0.04 ng/mL (ref <0.06)

## 2010-07-02 ENCOUNTER — Telehealth: Payer: Self-pay | Admitting: Family Medicine

## 2010-07-08 ENCOUNTER — Ambulatory Visit: Payer: Self-pay | Admitting: Cardiology

## 2010-08-03 ENCOUNTER — Telehealth (INDEPENDENT_AMBULATORY_CARE_PROVIDER_SITE_OTHER): Payer: Self-pay

## 2010-08-04 ENCOUNTER — Ambulatory Visit: Payer: Self-pay | Admitting: Family Medicine

## 2010-08-04 ENCOUNTER — Encounter: Payer: Self-pay | Admitting: Physician Assistant

## 2010-08-04 DIAGNOSIS — R071 Chest pain on breathing: Secondary | ICD-10-CM | POA: Insufficient documentation

## 2010-08-04 DIAGNOSIS — R110 Nausea: Secondary | ICD-10-CM | POA: Insufficient documentation

## 2010-08-10 ENCOUNTER — Telehealth (INDEPENDENT_AMBULATORY_CARE_PROVIDER_SITE_OTHER): Payer: Self-pay

## 2010-08-12 ENCOUNTER — Ambulatory Visit: Payer: Self-pay | Admitting: Gastroenterology

## 2010-08-12 DIAGNOSIS — K3189 Other diseases of stomach and duodenum: Secondary | ICD-10-CM | POA: Insufficient documentation

## 2010-08-12 DIAGNOSIS — R1013 Epigastric pain: Secondary | ICD-10-CM

## 2010-08-16 ENCOUNTER — Telehealth: Payer: Self-pay | Admitting: Family Medicine

## 2010-08-16 ENCOUNTER — Encounter: Payer: Self-pay | Admitting: Family Medicine

## 2010-08-16 LAB — CONVERTED CEMR LAB: TSH: 1.544 microintl units/mL (ref 0.350–4.500)

## 2010-08-20 ENCOUNTER — Emergency Department (HOSPITAL_COMMUNITY): Admission: EM | Admit: 2010-08-20 | Discharge: 2010-08-20 | Payer: Self-pay | Admitting: Emergency Medicine

## 2010-08-20 ENCOUNTER — Encounter: Payer: Self-pay | Admitting: Family Medicine

## 2010-08-20 ENCOUNTER — Telehealth: Payer: Self-pay | Admitting: Family Medicine

## 2010-08-20 ENCOUNTER — Telehealth (INDEPENDENT_AMBULATORY_CARE_PROVIDER_SITE_OTHER): Payer: Self-pay

## 2010-08-23 ENCOUNTER — Ambulatory Visit: Payer: Self-pay | Admitting: Family Medicine

## 2010-08-23 ENCOUNTER — Encounter: Payer: Self-pay | Admitting: Gastroenterology

## 2010-08-23 ENCOUNTER — Encounter: Payer: Self-pay | Admitting: Physician Assistant

## 2010-08-23 DIAGNOSIS — F411 Generalized anxiety disorder: Secondary | ICD-10-CM | POA: Insufficient documentation

## 2010-08-26 ENCOUNTER — Ambulatory Visit: Payer: Self-pay | Admitting: Family Medicine

## 2010-08-26 ENCOUNTER — Telehealth: Payer: Self-pay | Admitting: Family Medicine

## 2010-08-26 ENCOUNTER — Encounter: Payer: Self-pay | Admitting: Gastroenterology

## 2010-08-27 ENCOUNTER — Encounter: Payer: Self-pay | Admitting: Family Medicine

## 2010-08-30 ENCOUNTER — Ambulatory Visit (HOSPITAL_COMMUNITY): Admission: RE | Admit: 2010-08-30 | Discharge: 2010-08-30 | Payer: Self-pay | Admitting: Family Medicine

## 2010-08-30 IMAGING — MG MM DIGITAL SCREENING
4 series · 4 of 4 positions shown · non-contrast
Comparison: none

DG SCREEN MAMMOGRAM BILATERAL
Bilateral CC and MLO view(s) were taken.

DIGITAL SCREENING MAMMOGRAM WITH CAD:
There are scattered fibroglandular densities.  No masses or malignant type calcifications are 
identified.  Compared with prior studies.
Images were processed with CAD.

[L CC]
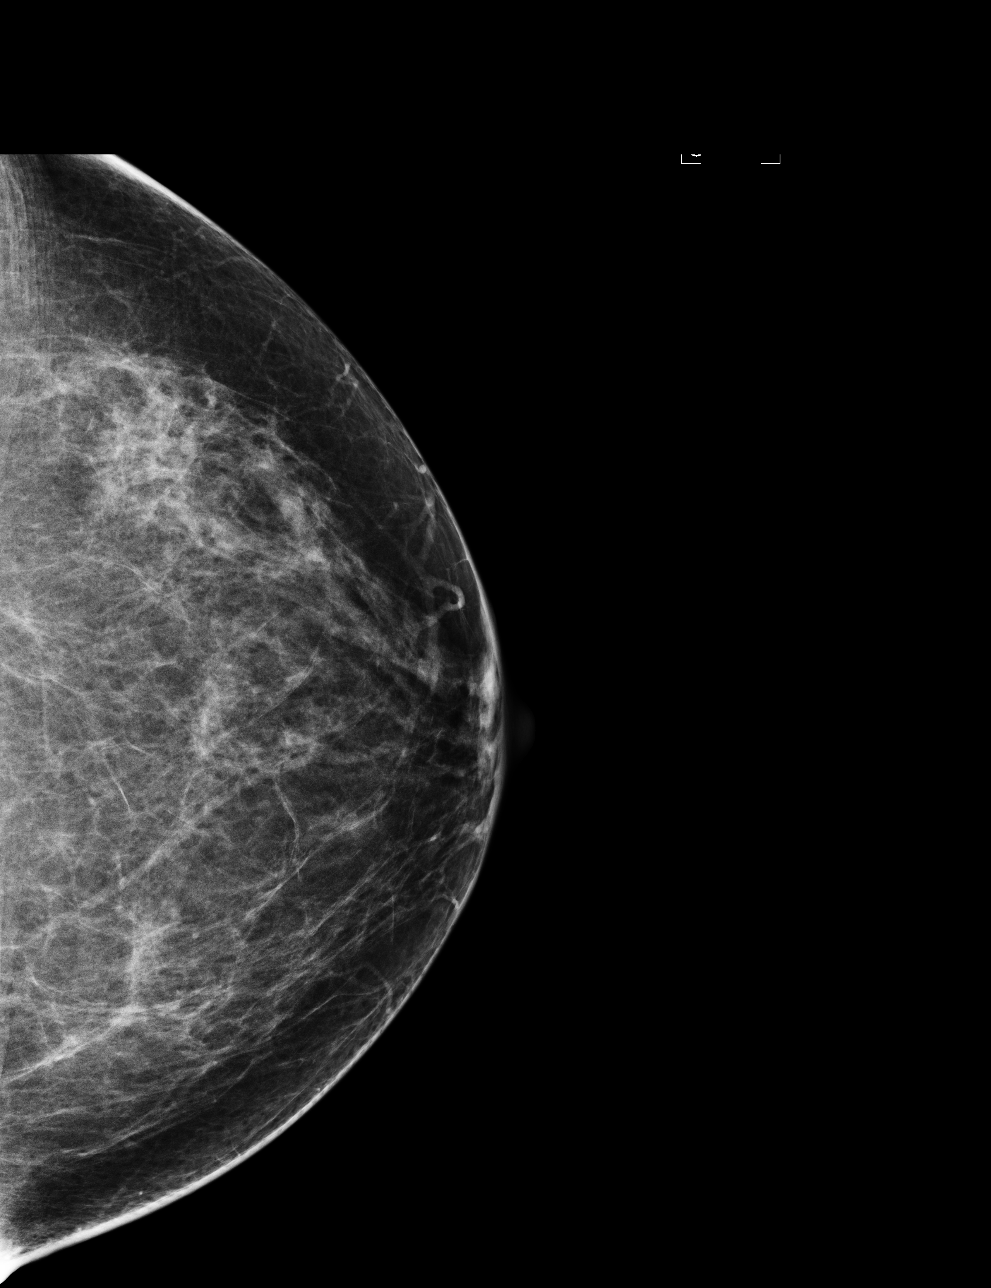

[L MLO]
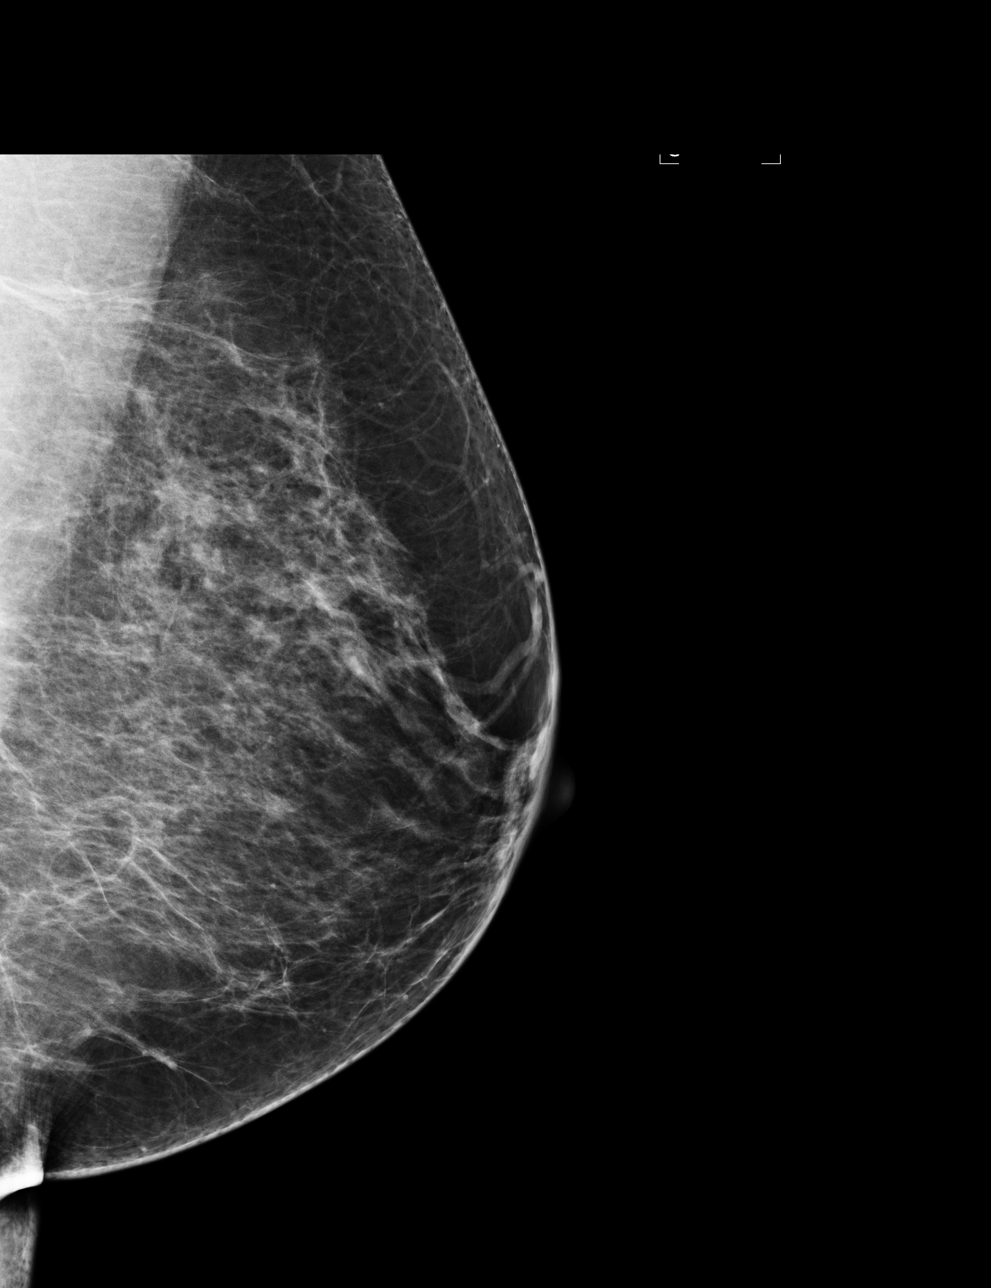

[R CC]
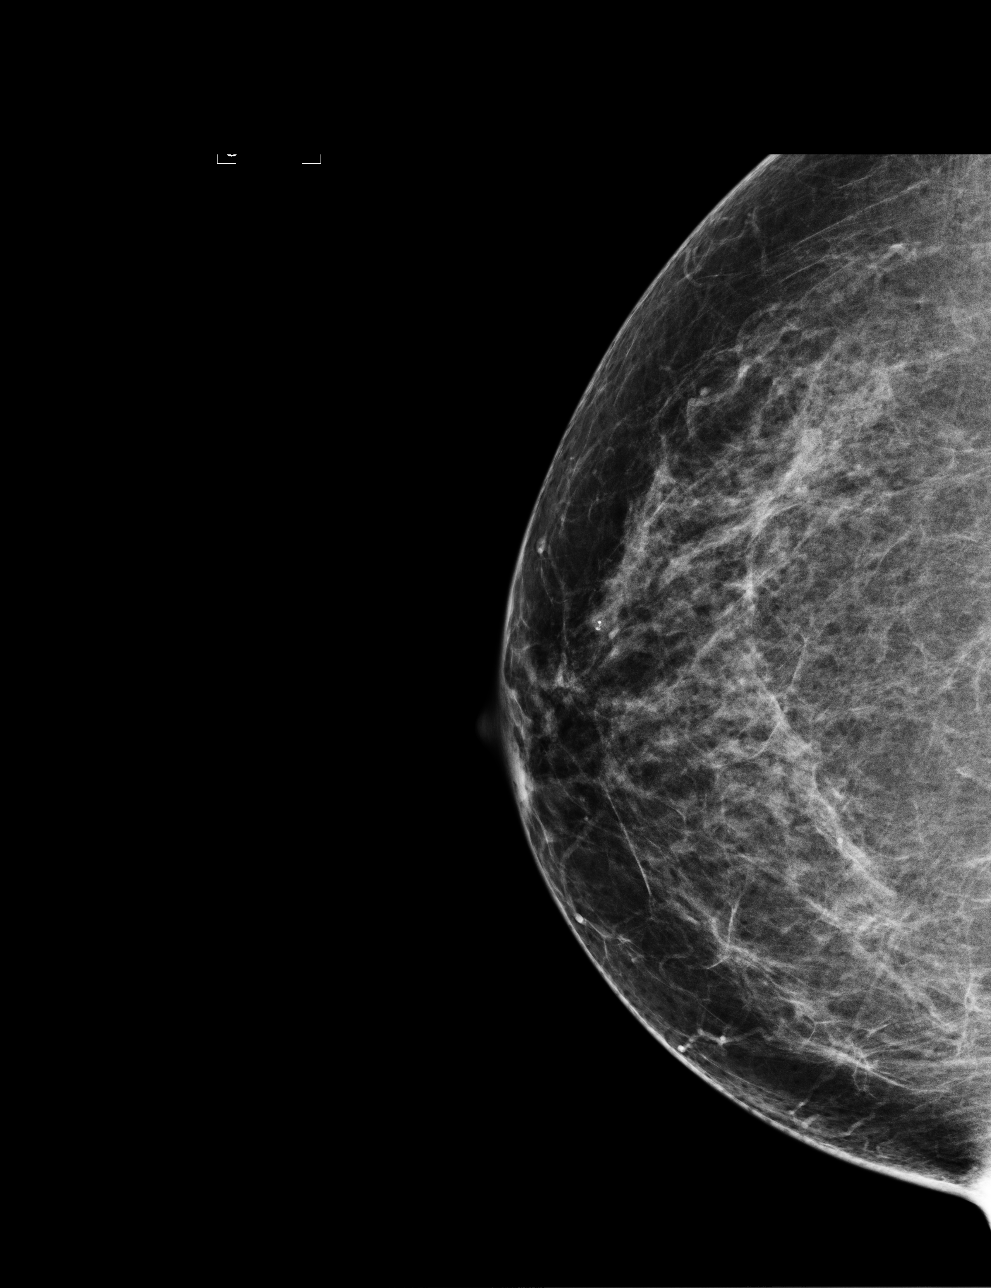

[R MLO]
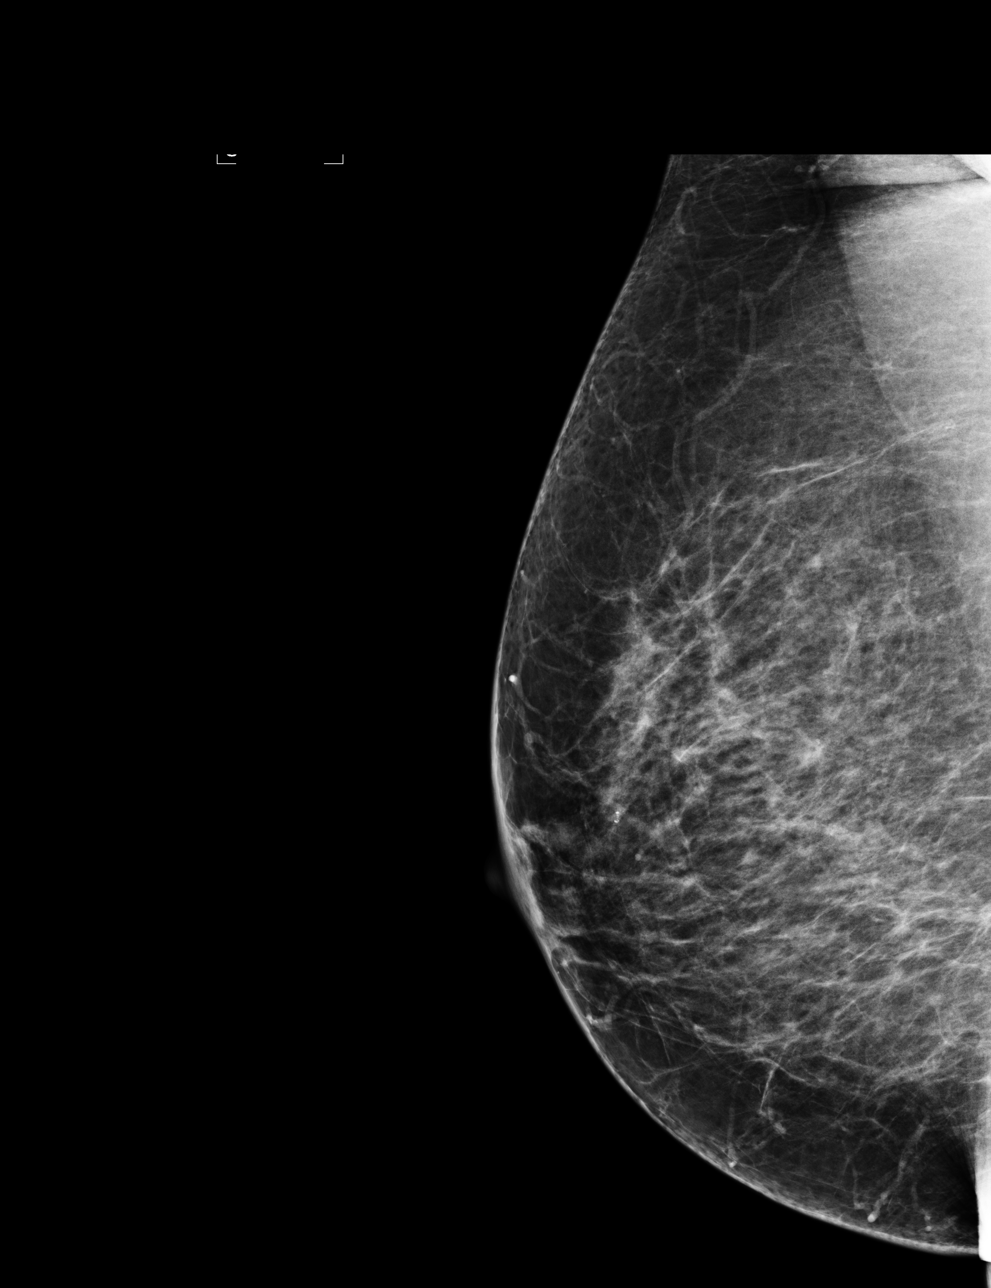

[4 of 4 positions shown; findings below may reference images not displayed]

IMPRESSION: No specific mammographic evidence of malignancy.  Next screening mammogram is recommended in one 
year.

A result letter of this screening mammogram will be mailed directly to the patient.

ASSESSMENT: Negative - BI-RADS 1

Screening mammogram in 1 year.
,

## 2010-09-01 ENCOUNTER — Encounter (INDEPENDENT_AMBULATORY_CARE_PROVIDER_SITE_OTHER): Payer: Self-pay | Admitting: *Deleted

## 2010-09-08 ENCOUNTER — Telehealth (INDEPENDENT_AMBULATORY_CARE_PROVIDER_SITE_OTHER): Payer: Self-pay

## 2010-09-13 ENCOUNTER — Encounter (INDEPENDENT_AMBULATORY_CARE_PROVIDER_SITE_OTHER): Payer: Self-pay

## 2010-09-13 ENCOUNTER — Ambulatory Visit: Payer: Self-pay | Admitting: Internal Medicine

## 2010-09-13 DIAGNOSIS — K5900 Constipation, unspecified: Secondary | ICD-10-CM | POA: Insufficient documentation

## 2010-09-15 ENCOUNTER — Ambulatory Visit: Payer: Self-pay | Admitting: Family Medicine

## 2010-09-29 ENCOUNTER — Ambulatory Visit: Payer: Self-pay | Admitting: Family Medicine

## 2010-10-05 ENCOUNTER — Encounter: Payer: Self-pay | Admitting: Family Medicine

## 2010-10-05 ENCOUNTER — Telehealth (INDEPENDENT_AMBULATORY_CARE_PROVIDER_SITE_OTHER): Payer: Self-pay | Admitting: *Deleted

## 2010-10-08 ENCOUNTER — Encounter: Payer: Self-pay | Admitting: Gastroenterology

## 2010-11-15 ENCOUNTER — Telehealth: Payer: Self-pay | Admitting: Family Medicine

## 2010-11-19 LAB — CONVERTED CEMR LAB
T3, Free: 3.2 pg/mL (ref 2.3–4.2)
TSH: 0.032 microintl units/mL — ABNORMAL LOW (ref 0.350–4.500)

## 2010-11-21 ENCOUNTER — Emergency Department (HOSPITAL_COMMUNITY)
Admission: EM | Admit: 2010-11-21 | Discharge: 2010-11-21 | Payer: Self-pay | Source: Home / Self Care | Admitting: Emergency Medicine

## 2010-11-21 IMAGING — CR DG WRIST COMPLETE 3+V*R*
2 series · 2 of 2 positions shown · non-contrast
Comparison: None.

CLINICAL DATA: Right wrist pain secondary to a twisting injury
today.

RIGHT WRIST - COMPLETE 3+ VIEW

[view not recorded (1 of 2)]
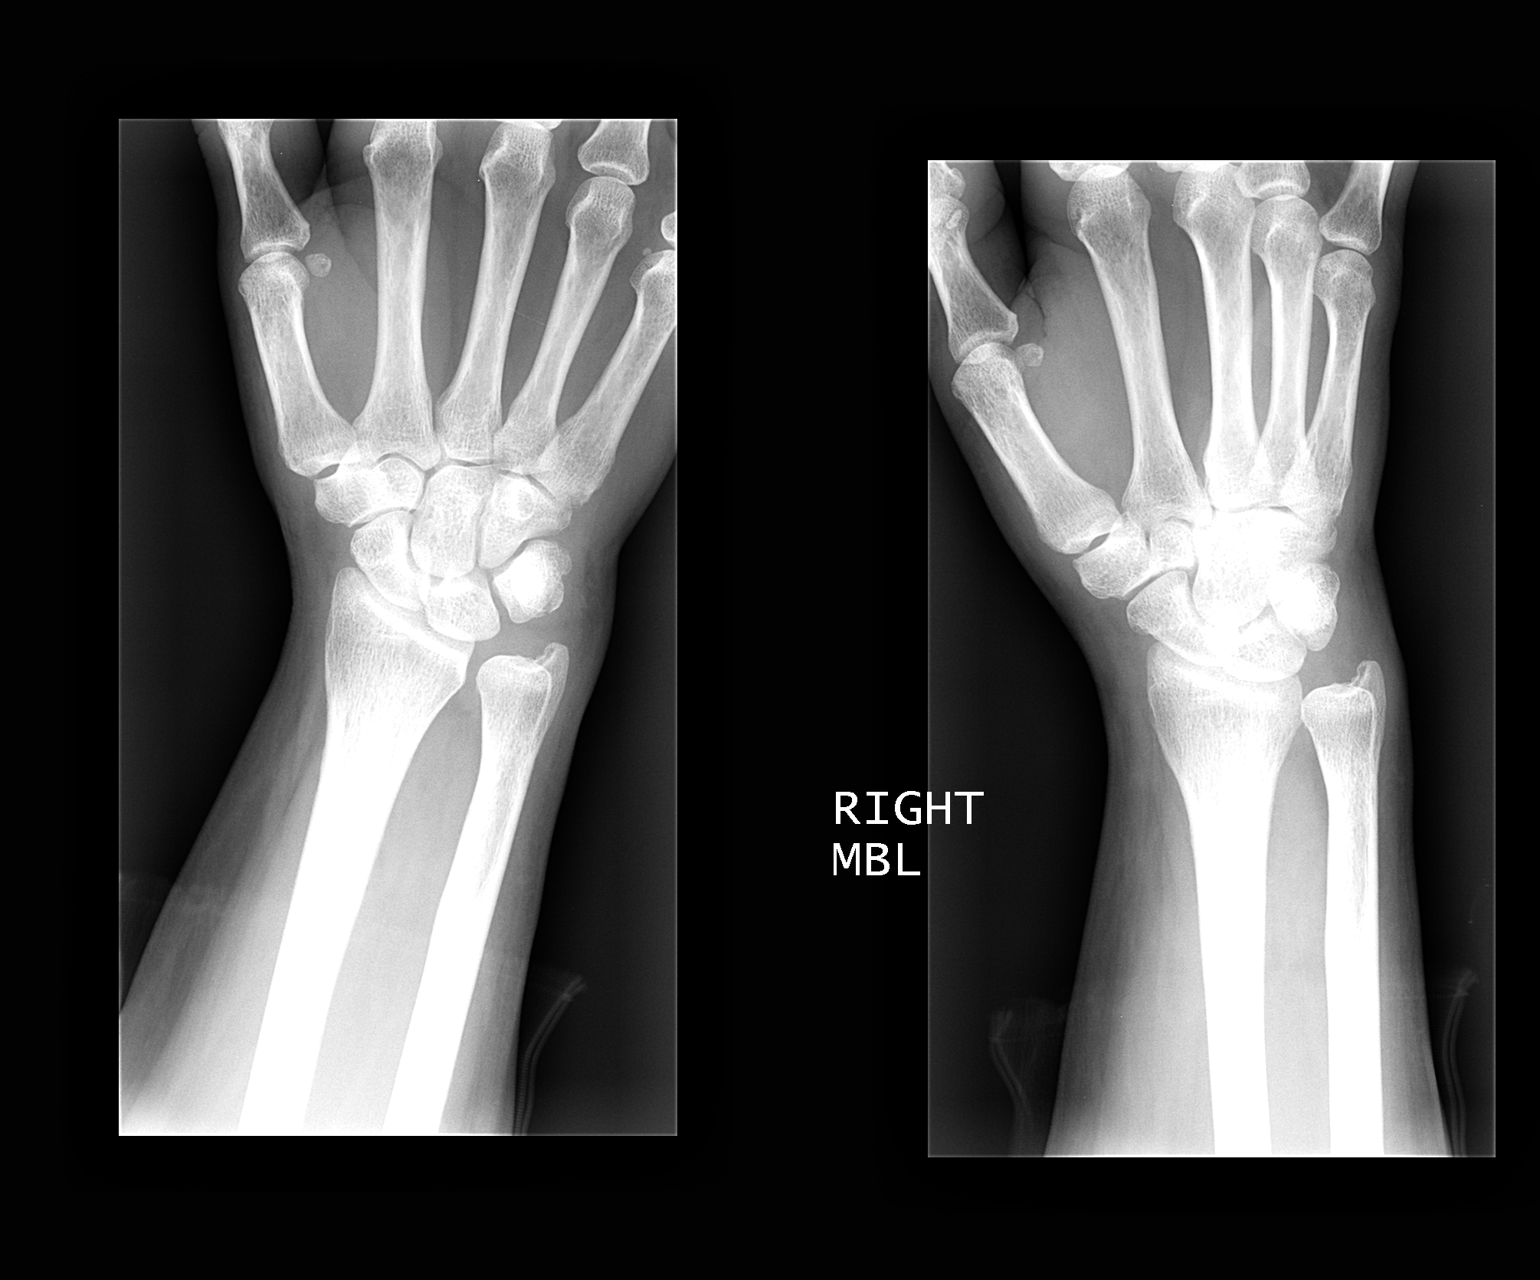

[view not recorded (2 of 2)]
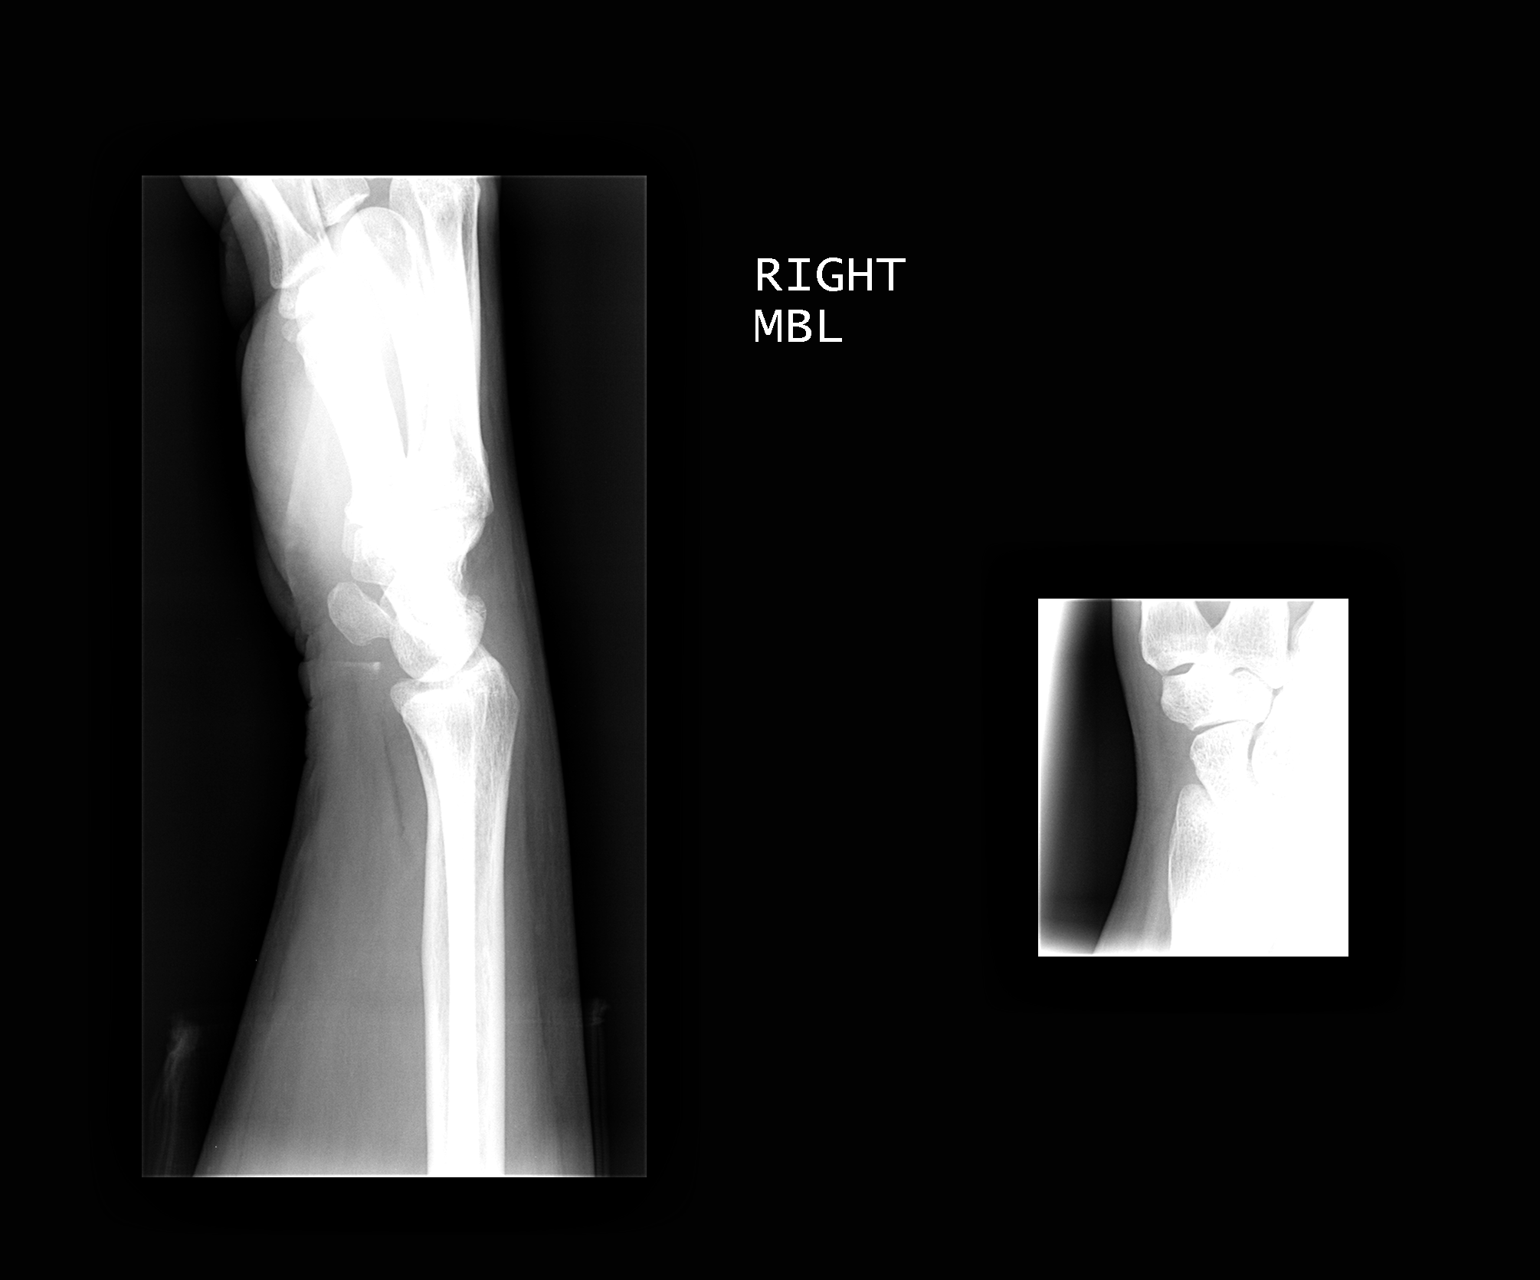

[2 of 2 positions shown; findings below may reference images not displayed]

FINDINGS: There is no fracture or dislocation or other acute
osseous abnormality.  There is slight widening of the space between
the distal ulna and radius.  Does the patient have tenderness of
the distal radial ulnar joint?
IMPRESSION: No fracture or dislocation.  Slight widening of the distal radial
ulnar joint.

## 2010-11-23 ENCOUNTER — Encounter: Payer: Self-pay | Admitting: Family Medicine

## 2010-11-28 LAB — CONVERTED CEMR LAB
Albumin: 4.4 g/dL (ref 3.5–5.2)
Alkaline Phosphatase: 55 units/L (ref 39–117)
Chloride: 101 meq/L (ref 96–112)
Creatinine, Ser: 1 mg/dL (ref 0.40–1.20)
HDL: 50 mg/dL (ref >39)
LDL Cholesterol: 128 mg/dL — ABNORMAL HIGH (ref 0–99)
Potassium: 3.5 meq/L (ref 3.5–5.3)
Total Protein: 7.6 g/dL (ref 6.0–8.3)
Triglycerides: 128 mg/dL (ref <150)

## 2010-11-30 ENCOUNTER — Encounter: Payer: Self-pay | Admitting: Family Medicine

## 2010-11-30 ENCOUNTER — Ambulatory Visit
Admission: RE | Admit: 2010-11-30 | Discharge: 2010-11-30 | Payer: Self-pay | Source: Home / Self Care | Attending: Family Medicine | Admitting: Family Medicine

## 2010-11-30 NOTE — Letter (Signed)
Summary: Out of Work  [facility]  [address]   [place], [place] [address]   Phone: [phone]  Fax: [phone]    November 03, 2009   Employee:  SALLYANNE BIRKHEAD    To Whom It May Concern:   For Medical reasons, please excuse the above named employee from work for the following dates:  Start:   11/03/09  End:   11/04/09  If you need additional information, please feel free to contact our office.         Sincerely,    Milus Mallick. Lodema Hong, MD

## 2010-11-30 NOTE — Letter (Signed)
Summary: EGD ORDER  EGD ORDER   Imported By: Ave Filter 12/15/2009 16:36:38  _____________________________________________________________________  External Attachment:    Type:   Image     Comment:   External Document

## 2010-11-30 NOTE — Letter (Signed)
Summary: FMLA PAPER  FMLA PAPER   Imported By: Lind Guest 08/27/2010 09:43:35  _____________________________________________________________________  External Attachment:    Type:   Image     Comment:   External Document

## 2010-11-30 NOTE — Letter (Signed)
Summary: Recall Office Visit  [facility]  [address]   [place], [place] [address]   Phone: [phone]  Fax: [phone]      September 01, 2010   Laura [name] [address] [place], [place]  [address] 08/18/1951   Dear [name],   According to our records, it is time for you to schedule a follow-up office visit with [name].   At your convenience, please call [phone] to schedule an office visit. If you have any questions, concerns, or feel that this letter is in error, we would appreciate your call.   Sincerely,    Diana Eves  [facility] Ph: [phone]   Fax: [phone]

## 2010-11-30 NOTE — Assessment & Plan Note (Signed)
Summary: sick room- 2   Vital Signs:  Patient profile:   60 year old female Menstrual status:  hysterectomy Height:      64 inches Weight:      180 pounds BMI:     31.01 O2 Sat:      97 % on Room air Pulse rate:   78 / minute Resp:     16 per minute BP sitting:   141 / 90  Vitals Entered By: Adella Hare LPN (December 21, 2009 8:25 AM) CC: body aches, fever,  sore throat, no appetite, weak Is Patient Diabetic? No Pain Assessment Patient in pain? no        Primary Provider:  Lodema Hong, M.D.  CC:  body aches, fever, sore throat, no appetite, and weak.  History of Present Illness: Pt presents today with 2nd day of body aches, low grade fever, sore thrt, and cough.  She states her husband has had the same for 7 days.  She believes this is the flu.  She is not using any otc cold meds.  The cough is nonproductive and not real bothersome.  Mostly the body aches & sore thrt are what she's complaining of .  Allergies (verified): No Known Drug Allergies PMH-FH-SH reviewed for relevance  Review of Systems General:  Complains of chills, fatigue, fever, and loss of appetite. ENT:  Complains of sore throat; denies earache, nasal congestion, postnasal drainage, and sinus pressure. CV:  Denies chest pain or discomfort and palpitations. Resp:  Complains of cough; denies coughing up blood, shortness of breath, sputum productive, and wheezing. GI:  Denies abdominal pain, diarrhea, nausea, and vomiting. MS:  Complains of muscle aches. Heme:  Denies enlarge lymph nodes.  Physical Exam  General:  Well-developed,well-nourished,in no acute distress; alert,appropriate and cooperative throughout examination Head:  Normocephalic and atraumatic without obvious abnormalities. No apparent alopecia or balding. Ears:  External ear exam shows no significant lesions or deformities.  Otoscopic examination reveals clear canals, tympanic membranes are intact bilaterally without bulging, retraction,  inflammation or discharge. Hearing is grossly normal bilaterally. Nose:  External nasal examination shows no deformity or inflammation. Nasal mucosa are pink and moist without lesions or exudates. Mouth:  Oral mucosa and oropharynx without lesions or exudates.  Teeth in good repair. Neck:  No deformities, masses, or tenderness noted. Lungs:  Normal respiratory effort, chest expands symmetrically. Lungs are clear to auscultation, no crackles or wheezes. Heart:  Normal rate and regular rhythm. S1 and S2 normal without gallop, murmur, click, rub or other extra sounds. Abdomen:  Bowel sounds positive,abdomen soft and non-tender without masses, organomegaly or hernias noted. Cervical Nodes:  No lymphadenopathy noted Psych:  Cognition and judgment appear intact. Alert and cooperative with normal attention span and concentration. No apparent delusions, illusions, hallucinations   Impression & Recommendations:  Problem # 1:  INFLUENZA (ICD-487.8) Assessment New  Rest, increase fluids, use Tylenol [phone] mg every 4-6 hours, and avoid contact with others. call office if no improvement in 5-7 days or if symptoms worsen.   Problem # 2:  HYPERTENSION (ICD-401.9) Assessment: Unchanged  Her updated medication list for this problem includes:    Furosemide 20 Mg Tabs (Furosemide) .Marland Kitchen... Take 1 tablet by mouth once a day as needed    Diovan 160 Mg Tabs (Valsartan) ..... One tab by mouth qd  BP today: 141/90 Prior BP: 140/90 (12/15/2009)  Labs Reviewed: K+: 3.8 (08/08/2009) Creat: : 1.06 (08/08/2009)   Chol: 189 (08/08/2009)   HDL: 47 (08/08/2009)   LDL:  109 (08/08/2009)   TG: 166 (08/08/2009)  Her updated medication list for this problem includes:    Furosemide 20 Mg Tabs (Furosemide) .Marland Kitchen... Take 1 tablet by mouth once a day as needed    Diovan 160 Mg Tabs (Valsartan) ..... One tab by mouth qd  Complete Medication List: 1)  Lovastatin 40 Mg Tabs (Lovastatin) .... Take two tabs by mouth at bedtime  for cholesterol 2)  Pantoprazole Sodium 40 Mg Tbec (Pantoprazole sodium) .... Take 1 tablet by mouth two times a day 3)  Loratadine 10 Mg Tabs (Loratadine) .... Take 1 tablet by mouth once a day 4)  Levothyroxine Sodium 75 Mcg Tabs (Levothyroxine sodium) .... Take 1 tablet by mouth once a day 5)  Furosemide 20 Mg Tabs (Furosemide) .... Take 1 tablet by mouth once a day as needed 6)  Klor-con M20 20 Meq Cr-tabs (Potassium chloride crys cr) .... Take 1 tablet by mouth once a day as needed 7)  Diovan 160 Mg Tabs (Valsartan) .... One tab by mouth qd 8)  Vimovo 500-20 Mg Tbec (Naproxen-esomeprazole) .... Take 1 tablet by mouth two times a day as needed 9)  Carafate 1 Gm/21ml Susp (Sucralfate) .Marland Kitchen.. 10cc before meals and at bedtime 10)  Dicyclomine Hcl 10 Mg Caps (Dicyclomine hcl) .... Take 1 capsule by mouth two times a day as needed 11)  Vimobo 500/20  .... Take 1 tablet by mouth two times a day 12)  Tramadol Hcl 50 Mg Tabs (Tramadol hcl) .... Take 1 tablet by mouth once a day 13)  Tamiflu 75 Mg Caps (Oseltamivir phosphate) .Marland Kitchen.. 1 two times a day  Patient Instructions: 1)  Please schedule a follow-up appointment as needed. 2)  Increase fluid intake. 3)  Recommend you try Mucinex DM for cough. 4)  You may return to work when free of fever for 24 hrs without the use of fever reducers.  Prescriptions: TAMIFLU 75 MG CAPS (OSELTAMIVIR PHOSPHATE) 1 two times a day  #10 x 0   Entered and Authorized by:   Esperanza Sheets PA   Signed by:   Esperanza Sheets PA on 12/21/2009   Method used:   Electronically to        [facility] (retail)       [address]       [place], [place]  [address]       Ph: [phone]       Fax: [phone]   RxID:   [id number]

## 2010-11-30 NOTE — Assessment & Plan Note (Signed)
Summary: phy   Vital Signs:  Patient profile:   60 year old female Menstrual status:  hysterectomy Height:      64 inches Weight:      173 pounds BMI:     29.80 O2 Sat:      96 % Pulse rate:   81 / minute Pulse rhythm:   regular Resp:     16 per minute BP sitting:   140 / 90  (left arm) Cuff size:   regular  Vitals Entered By: Everitt Amber LPN (May 24, 2010 3:59 PM)  Nutrition Counseling: Patient's BMI is greater than 25 and therefore counseled on weight management options. CC: Follow up chronic problems   Primary Care Provider:  Lodema Hong, M.D.   CC:  Follow up chronic problems.  History of Present Illness: Reports  that she has been doing well. Denies recent fever or chills. Denies sinus pressure, nasal congestion , ear pain or sore throat. Denies chest congestion, or cough productive of sputum. Denies chest pain, palpitations, PND, orthopnea or leg swelling. Denies abdominal pain, nausea, vomitting, diarrhea or constipation. Denies change in bowel movements or bloody stool. Denies dysuria , frequency, incontinence or hesitancy. Denies  joint pain, swelling, or reduced mobility. Denies headaches, vertigo, seizures. Denies depression, anxiety or insomnia. Denies  rash, lesions, or itch.     Allergies: 1)  ! Nsaids  Review of Systems      See HPI General:  Denies chills, fatigue, and fever. Eyes:  Complains of eye pain; denies blurring and discharge. Psych:  Denies anxiety and depression. Endo:  Denies cold intolerance, excessive hunger, excessive thirst, and polyuria. Heme:  Denies abnormal bruising and bleeding. Allergy:  Complains of seasonal allergies.  Physical Exam  General:  Well-developed,well-nourished,in no acute distress; alert,appropriate and cooperative throughout examination Head:  Normocephalic and atraumatic without obvious abnormalities. No apparent alopecia or balding. Eyes:  No corneal or conjunctival inflammation noted. EOMI. Perrla.  Funduscopic exam benign, without hemorrhages, exudates or papilledema. Vision grossly normal. Ears:  External ear exam shows no significant lesions or deformities.  Otoscopic examination reveals clear canals, tympanic membranes are intact bilaterally without bulging, retraction, inflammation or discharge. Hearing is grossly normal bilaterally. Nose:  External nasal examination shows no deformity or inflammation. Nasal mucosa are pink and moist without lesions or exudates. Mouth:  pharynx pink and moist and fair dentition.   Neck:  No deformities, masses, or tenderness noted. Chest Wall:  No deformities, masses, or tenderness noted. Breasts:  No mass, nodules, thickening, tenderness, bulging, retraction, inflamation, nipple discharge or skin changes noted.   Lungs:  Normal respiratory effort, chest expands symmetrically. Lungs are clear to auscultation, no crackles or wheezes. Heart:  Normal rate and regular rhythm. S1 and S2 normal without gallop, murmur, click, rub or other extra sounds. Abdomen:  Bowel sounds positive,abdomen soft and non-tender without masses, organomegaly or hernias noted. Rectal:  No external abnormalities noted. Normal sphincter tone. No rectal masses or tenderness.Guaic negative stool Genitalia:  no external lesions and no adnexal masses or tenderness. uterus absent  Msk:  No deformity or scoliosis noted of thoracic or lumbar spine.   Pulses:  R and L carotid,radial,femoral,dorsalis pedis and posterior tibial pulses are full and equal bilaterally Extremities:  No clubbing, cyanosis, edema, or deformity noted with normal full range of motion of all joints.   Neurologic:  No cranial nerve deficits noted. Station and gait are normal. Plantar reflexes are down-going bilaterally. DTRs are symmetrical throughout. Sensory, motor and coordinative functions appear  intact. Skin:  Intact without suspicious lesions or rashes Cervical Nodes:  No lymphadenopathy noted Axillary Nodes:  No  palpable lymphadenopathy Inguinal Nodes:  No significant adenopathy Psych:  Cognition and judgment appear intact. Alert and cooperative with normal attention span and concentration. No apparent delusions, illusions, hallucinations   Impression & Recommendations:  Problem # 1:  PHYSICAL EXAMINATION (ICD-V70.0) Assessment Comment Only counselled pt re impt of regular physical activity, a diet rich in fruit and veg and low in salt, and alsoon safety issues.  Problem # 2:  GERD (ICD-530.81) Assessment: Improved  Her updated medication list for this problem includes:    Pantoprazole Sodium 40 Mg Tbec (Pantoprazole sodium) .Marland Kitchen... Take 1 tablet by mouth two times a day    Carafate 1 Gm/18ml Susp (Sucralfate) .Marland KitchenMarland KitchenMarland KitchenMarland Kitchen 10cc before meals and at bedtime  Problem # 3:  SCREENING FOR MALIGNANT NEOPLASM OF THE CERVIX (ICD-V76.2) Assessment: Comment Only pap sent  Problem # 4:  HYPOTHYROIDISM (ICD-244.9) Assessment: Comment Only  The following medications were removed from the medication list:    Levothyroxine Sodium 75 Mcg Tabs (Levothyroxine sodium) .Marland Kitchen... Take 1 tablet by mouth once a day Her updated medication list for this problem includes:    Levothyroxine Sodium 100 Mcg Tabs (Levothyroxine sodium) ..... One tablet daly monday through friday, and half a tableton saturday and sunday  Orders: T-TSH ([id number] Orders: Endocrinology Referral (Endocrine) ... 05/25/2010  Labs Reviewed: TSH: 2.190 (02/22/2010)    Chol: 178 (05/06/2010)   HDL: 50 (05/06/2010)   LDL: 98 (05/06/2010)   TG: 151 (05/06/2010)  Problem # 5:  HYPERLIPIDEMIA (ICD-272.4) Assessment: Deteriorated  Her updated medication list for this problem includes:    Lovastatin 40 Mg Tabs (Lovastatin) .Marland Kitchen... Take two tabs by mouth at bedtime for cholesterol  Orders: T-Hepatic Function [id number]) T-Lipid Profile [id number])  Labs Reviewed: SGOT: 17 (05/06/2010)   SGPT: 19 (05/06/2010)   HDL:50 (05/06/2010), 61  (62/95/2841)  LDL:98 (05/06/2010), 112 (32/44/0102)  Chol:178 (05/06/2010), 196 (01/29/2010)  Trig:151 (05/06/2010), 117 (01/29/2010)  Complete Medication List: 1)  Lovastatin 40 Mg Tabs (Lovastatin) .... Take two tabs by mouth at bedtime for cholesterol 2)  Pantoprazole Sodium 40 Mg Tbec (Pantoprazole sodium) .... Take 1 tablet by mouth two times a day 3)  Furosemide 20 Mg Tabs (Furosemide) .... Take 1 tablet by mouth once a day as needed 4)  Klor-con M20 20 Meq Cr-tabs (Potassium chloride crys cr) .... Take 1 tablet by mouth once a day as needed 5)  Diovan 160 Mg Tabs (Valsartan) .... One tab by mouth qd 6)  Carafate 1 Gm/46ml Susp (Sucralfate) .Marland Kitchen.. 10cc before meals and at bedtime 7)  Imipramine Hcl 10 Mg Tabs (Imipramine hcl) .Marland Kitchen.. 1 by mouth at bedtime for 3 days the 2 by mouth at bedtime for 3 days then 3 by mouth qhs 8)  Levothyroxine Sodium 100 Mcg Tabs (Levothyroxine sodium) .... One tablet daly monday through friday, and half a tableton saturday and sunday  Other Orders: T-Basic Metabolic Panel [id number])  Patient Instructions: 1)  Please schedule a follow-up appointment in 3 weeks. 2)  It is important that you exercise regularly at least 20 minutes 5 times a week. If you develop chest pain, have severe difficulty breathing, or feel very tired , stop exercising immediately and seek medical attention. 3)  You need to lose weight. Consider a lower calorie diet and regular exercise.  4)  I  am glad that you are feeling well. 5)  You will be referred to an [place]  in your bp med to divan/hctz 160/12.5 mg one daily, start tomorrow please, do not take the old diovan 6)  No furosemide or potassium 7)  Your mamo is due in October, and your exam is within normal.  8)  tSH today. 9)  BMP prior to visit, ICD-9: 10)  Hepatic Panel prior to visit, ICD-9:  fasting in 4 months 11)  Lipid Panel prior to visit, ICD-9: Prescriptions: LEVOTHYROXINE SODIUM 100 MCG TABS (LEVOTHYROXINE SODIUM) one  tablet daly monday through friday, and half a tableton Saturday and Sunday  #28 x 3   Entered and Authorized by:   Margaret Simpson MD   Signed by:   Margaret Simpson MD on 05/25/2010   Method used:   Printed then faxed to ...       [facility]* (retail)       [address]       [place], [place]  [address]       Ph: [phone]       Fax: [phone]   RxID:   [id number]   Laboratory Results    Stool - Occult Blood Hemmoccult #1: negative Date: 05/24/2010  diovan samples given, F0470 6/12

## 2010-11-30 NOTE — Letter (Signed)
Summary: FMLA PAPERS   FMLA PAPERS   Imported By: Lind Guest 10/05/2010 13:29:38  _____________________________________________________________________  External Attachment:    Type:   Image     Comment:   External Document

## 2010-11-30 NOTE — Progress Notes (Signed)
Summary: phone note/ pt to ED this AM  Phone Note Call from Patient   Caller: Patient Summary of Call: Pt called and said she started getting ready for work this AM and got real sick and trembling. She started vomiting.  she drove herself to the ED at [facility]. They gave her fluids, protonix and zofran and told her to get in touch with Dr. Darrick Penna. She thinks she needs scoping again. She also doesn't feel the Dexilant is helping. Please advise! Initial call taken by: Cloria Spring LPN,  August 20, 2010 10:51 AM     Appended Document: phone note/ pt to ED this AM Please cal pt. She has periodic episode of NV due to stress and reflux. She should continue the Imipramine. The Dexilant is working to shut down her acid pumps but will do nothing for her job-related stress. She should follow up with mental health for stress management. Continue Dexilant. Will refer to [facility] for second opinion.  Appended Document: phone note/ pt to ED this AM Informed pt of the above. she said she will see mental health but she DOES NOT want referral to [facility], said she will be OK.  Appended Document: phone note/ pt to ED this AM Pt called back and stated she would like the referral to [facility].Marland KitchenMarland KitchenI faxed referral to [facility].

## 2010-11-30 NOTE — Assessment & Plan Note (Signed)
Summary: ov   Vital Signs:  Patient profile:   60 year old female Menstrual status:  hysterectomy Height:      64 inches Weight:      181.50 pounds BMI:     31.27 O2 Sat:      98 % Pulse rate:   59 / minute Pulse rhythm:   regular Resp:     16 per minute BP sitting:   122 / 80  Vitals Entered By: Everitt Amber (November 03, 2009 7:58 AM)  Nutrition Counseling: Patient's BMI is greater than 25 and therefore counseled on weight management options. CC: started having contractions like pain this morning and she thinks it's her hiatal hernia acting up Pain Assessment Patient in pain? yes     Location: abdomen Intensity: 6 Type: burning Onset of pain  this morning   Primary Care Provider:  Syliva Overman MD  CC:  started having contractions like pain this morning and she thinks it's her hiatal hernia acting up.  History of Present Illness: Acute upper abd pain this morning with cramping, 6 stools this morning the past 3 hrs, the last was watery and blood tinged, first episode of bloody stool. Thinks her son who visits may ave loose stools also In Christmas she had a similar episode, no nausea or vomitting . Pt hgas a longstanding h/o gERD and feels as if hersymptoms are worsening, she requests f/u with GI.  Current Medications (verified): 1)  Lovastatin 40 Mg Tabs (Lovastatin) .... Take Two Tabs By Mouth At Bedtime For Cholesterol 2)  Pantoprazole Sodium 40 Mg Tbec (Pantoprazole Sodium) .... Take 1 Tablet By Mouth Two Times A Day 3)  Loratadine 10 Mg Tabs (Loratadine) .... Take 1 Tablet By Mouth Once A Day 4)  Levothyroxine Sodium 75 Mcg Tabs (Levothyroxine Sodium) .... Take 1 Tablet By Mouth Once A Day 5)  Furosemide 20 Mg Tabs (Furosemide) .... Take 1 Tablet By Mouth Once A Day As Needed 6)  Klor-Con M20 20 Meq Cr-Tabs (Potassium Chloride Crys Cr) .... Take 1 Tablet By Mouth Once A Day As Needed 7)  Diovan 160 Mg Tabs (Valsartan) .... One Tab By Mouth Qd 8)  Vimovo 500-20 Mg  Tbec (Naproxen-Esomeprazole) .... Take 1 Tablet By Mouth Two Times A Day As Needed  Allergies (verified): No Known Drug Allergies  Review of Systems      See HPI General:  Denies chills and fever. ENT:  Denies earache, hoarseness, and sinus pressure. CV:  Denies chest pain or discomfort, palpitations, and swelling of feet. Resp:  Denies cough and sputum productive. GI:  See HPI. GU:  Denies dysuria and urinary frequency.  Physical Exam  General:  Well-developed,well-nourished,in no acute distress; alert,appropriate and cooperative throughout examination HEENT: No facial asymmetry,  EOMI, No sinus tenderness, TM's Clear, oropharynx  pink and moist.   Chest: Clear to auscultation bilaterally.  CVS: S1, S2, No murmurs, No S3.   Abd: Soft, mild epigastric tenderness, no guarding or rebound. Bowel sounds present and normal. Rectal:guaic negativestool.  MS: Adequate ROM spine, hips, shoulders and knees. positve left tinnel  also tenderness and decreased mobility of 5th finger right hands Ext: No edema.   CNS: CN 2-12 intact, power tone and sensation normal throughout.   Skin: Intact, no visible lesions or rashes.  Psych: Good eye contact, normal affect.  Memory intact, not anxious or depressed appearing.    Impression & Recommendations:  Problem # 1:  GERD (ICD-530.81) Assessment Deteriorated  Her updated medication list  for this problem includes:    Pantoprazole Sodium 40 Mg Tbec (Pantoprazole sodium) .Marland Kitchen... Take 1 tablet by mouth two times a day    Carafate 1 Gm/11ml Susp (Sucralfate) .Marland KitchenMarland KitchenMarland KitchenMarland Kitchen 10cc before meals and at bedtime    Dicyclomine Hcl 10 Mg Caps (Dicyclomine hcl) .Marland Kitchen... Take 1 capsule by mouth two times a day as needed  Problem # 2:  HYPERTENSION (ICD-401.9) Assessment: Unchanged  Her updated medication list for this problem includes:    Furosemide 20 Mg Tabs (Furosemide) .Marland Kitchen... Take 1 tablet by mouth once a day as needed    Diovan 160 Mg Tabs (Valsartan) ..... One tab  by mouth qd  BP today: 122/80 Prior BP: 122/86 (10/22/2009)  Labs Reviewed: K+: 3.8 (08/08/2009) Creat: : 1.06 (08/08/2009)   Chol: 189 (08/08/2009)   HDL: 47 (08/08/2009)   LDL: 109 (08/08/2009)   TG: 166 (08/08/2009)  Problem # 3:  HYPOTHYROIDISM (ICD-244.9) Assessment: Comment Only  Her updated medication list for this problem includes:    Levothyroxine Sodium 75 Mcg Tabs (Levothyroxine sodium) .Marland Kitchen... Take 1 tablet by mouth once a day  Labs Reviewed: TSH: 1.793 (10/22/2009)    Chol: 189 (08/08/2009)   HDL: 47 (08/08/2009)   LDL: 109 (08/08/2009)   TG: 166 (08/08/2009)  Complete Medication List: 1)  Lovastatin 40 Mg Tabs (Lovastatin) .... Take two tabs by mouth at bedtime for cholesterol 2)  Pantoprazole Sodium 40 Mg Tbec (Pantoprazole sodium) .... Take 1 tablet by mouth two times a day 3)  Loratadine 10 Mg Tabs (Loratadine) .... Take 1 tablet by mouth once a day 4)  Levothyroxine Sodium 75 Mcg Tabs (Levothyroxine sodium) .... Take 1 tablet by mouth once a day 5)  Furosemide 20 Mg Tabs (Furosemide) .... Take 1 tablet by mouth once a day as needed 6)  Klor-con M20 20 Meq Cr-tabs (Potassium chloride crys cr) .... Take 1 tablet by mouth once a day as needed 7)  Diovan 160 Mg Tabs (Valsartan) .... One tab by mouth qd 8)  Vimovo 500-20 Mg Tbec (Naproxen-esomeprazole) .... Take 1 tablet by mouth two times a day as needed 9)  Carafate 1 Gm/87ml Susp (Sucralfate) .Marland Kitchen.. 10cc before meals and at bedtime 10)  Dicyclomine Hcl 10 Mg Caps (Dicyclomine hcl) .... Take 1 capsule by mouth two times a day as needed  Other Orders: Hemoccult Guaiac-1 spec.(in office) ([id number]) Diagnostic X-Ray/Fluoroscopy (Diagnostic X-Ray/Flu)  Patient Instructions: 1)  F/U as before. 2)  We will call back about your Gi appt today. 3)  Meds are sent in for stomach cramps and heartburn. 4)  Wk excuse to return in am. Prescriptions: DIOVAN 160 MG TABS (VALSARTAN) one tab by mouth qd  #30 x 3   Entered by:   Worthy Keeler LPN   Authorized by:   Syliva Overman MD   Signed by:   Worthy Keeler LPN on 98/08/9146   Method used:   Electronically to        [facility]* (retail)       [address] [place], [place]  [address]       Ph: [phone]       Fax: [phone]   RxID:   [id number] LEVOTHYROXINE SODIUM 75 MCG TABS (LEVOTHYROXINE SODIUM) Take 1 tablet by mouth once a day  #30 x 3   Entered by:   Worthy Keeler LPN   Authorized by:   Syliva Overman MD  Signed by:   Worthy Keeler LPN on 16/08/9603   Method used:   Electronically to        [facility]* (retail)       [address] [place], [place]  [address]       Ph: [phone]       Fax: [phone]   RxID:   [id number] FUROSEMIDE 20 MG TABS (FUROSEMIDE) Take 1 tablet by mouth once a day as needed  #30 x 3   Entered by:   Worthy Keeler LPN   Authorized by:   Syliva Overman MD   Signed by:   Worthy Keeler LPN on 08/27/2535   Method used:   Electronically to        [facility]* (retail)       [address] [place], [place]  [address]       Ph: [phone]       Fax: [phone]   RxID:   [id number] PANTOPRAZOLE SODIUM 40 MG TBEC (PANTOPRAZOLE SODIUM) Take 1 tablet by mouth two times a day  #60 Each x 3   Entered by:   Worthy Keeler LPN   Authorized by:   Syliva Overman MD   Signed by:   Worthy Keeler LPN on 32/35/5732   Method used:   Electronically to        [facility]* (retail)       [address] [place], [place]  [address]       Ph: [phone]       Fax: [phone]   RxID:   [id number] DICYCLOMINE HCL 10 MG CAPS (DICYCLOMINE HCL) Take 1 capsule by mouth two times a day as needed  #60 x 0   Entered and Authorized by:   Syliva Overman MD   Signed by:   Syliva Overman MD on 11/03/2009   Method used:   Electronically to        [facility]* (retail)       [address] [place], [place]  [address]       Ph: [phone]       Fax: [phone]   RxID:   [id number] CARAFATE 1 GM/10ML SUSP (SUCRALFATE) 10cc before meals and at bedtime  #12026ml x 0   Entered and Authorized by:   Syliva Overman MD   Signed by:   Syliva Overman MD on 11/03/2009   Method used:   Electronically to        [facility]* (retail)       [address]       [place], [place]  [address]       Ph: [phone]       Fax: [phone]   RxID:   [id number]   Laboratory Results  Date/Time Received: November 03, 2009  Date/Time Reported: November 03, 2009   Stool - Occult Blood Hemmoccult #1: negative Date: 11/03/2009 Comments: [id number] 9R 8/11 118 10/12

## 2010-11-30 NOTE — Progress Notes (Signed)
Summary: APPT  Phone Note Call from Patient   Summary of Call: APPT. FOR DR. Dietrich Pates 9.8.11 @ 11:30 CALLED AND LEFT MESSAGE FOR Sham[place]  Initial call taken by: Lind Guest,  July 02, 2010 11:19 AM  Follow-up for Phone Call        left detailed message Follow-up by: Adella Hare LPN,  July 06, 2010 8:35 AM

## 2010-11-30 NOTE — Letter (Signed)
Summary: Letter  Letter   Imported By: Lind Guest 08/17/2010 14:36:37  _____________________________________________________________________  External Attachment:    Type:   Image     Comment:   External Document

## 2010-11-30 NOTE — Letter (Signed)
Summary: Out of Work  [facility]  [address]   [place], [place] [address]   Phone: [phone]  Fax: [phone]    August 04, 2010   Employee:  MACEL YEARSLEY    To Whom It May Concern:   For Medical reasons, please excuse the above named employee from work for the following dates:  Start:   08/04/10  End:   08/05/10  If you need additional information, please feel free to contact our office.         Sincerely,    Syliva Overman, MD

## 2010-11-30 NOTE — Assessment & Plan Note (Signed)
Summary: NEW ENDO CONSULT/ HYPOTHYROID/ [employer]  #   Vital Signs:  Patient profile:   60 year old female Menstrual status:  hysterectomy Height:      64 inches (162.56 cm) Weight:      168 pounds (76.36 kg) BMI:     28.94 O2 Sat:      98 % on Room air Temp:     97.0 degrees F (36.11 degrees C) oral Pulse rate:   67 / minute BP sitting:   118 / 80  (left arm) Cuff size:   regular  Vitals Entered By: Brenton Grills MA (June 22, 2010 2:44 PM)  O2 Flow:  Room air CC: New Endo/Hypothyroid/Dr. Simpson/aj   Referring Provider:  Lodema Hong Primary Provider:  Lodema Hong, M.D.   CC:  New Endo/Hypothyroid/Dr. Simpson/aj.  History of Present Illness: pt had partial left thyroid lobectomy approx 15 years ago, for a suspicious nodule, but it was benign on pathology. she has been on synthroid since then.  she does not notice the thyroid.   symptomatically, she has few years of intermittent cold intolerance, worst at the hands.  she has associated heat intolerance--she gets them together.  synthroid was last adjusted approx 2 weeks ago, per pt.  however, she says she has been taking 100 micrograms once daily.  Current Medications (verified): 1)  Lovastatin 40 Mg Tabs (Lovastatin) .... Take Two Tabs By Mouth At Bedtime For Cholesterol 2)  Pantoprazole Sodium 40 Mg Tbec (Pantoprazole Sodium) .... Take 1 Tablet By Mouth Two Times A Day 3)  Furosemide 20 Mg Tabs (Furosemide) .... Take 1 Tablet By Mouth Once A Day As Needed 4)  Klor-Con M20 20 Meq Cr-Tabs (Potassium Chloride Crys Cr) .... Take 1 Tablet By Mouth Once A Day As Needed 5)  Carafate 1 Gm/44ml Susp (Sucralfate) .Marland Kitchen.. 10cc Before Meals and At Bedtime 6)  Imipramine Hcl 10 Mg Tabs (Imipramine Hcl) .Marland Kitchen.. 1 By Mouth At Bedtime For 3 Days The 2 By Mouth At Bedtime For 3 Days Then 3 By Mouth Qhs 7)  Levothyroxine Sodium 100 Mcg Tabs (Levothyroxine Sodium) .... One Tablet Daly Monday Through Friday, and Half A Tableton Saturday and Sunday 8)  Diovan  Hct 160-12.5 Mg Tabs (Valsartan-Hydrochlorothiazide) .... Take 1 Tablet By Mouth Once A Day 9)  Tramadol Hcl 50 Mg Tabs (Tramadol Hcl) .... Take 1 Tab By Mouth At Bedtime For 5 Days Then As Needed For Pain  Allergies (verified): 1)  ! Nsaids  Family History: Reviewed history from 12/15/2009 and no changes required. THREE CHILDREN LIVING MOTHER DECEASED   IN  CHILDBIRTH FATHER UNKNOWN NO BROTHERS  NO SISTERS No FH of Colon Cancer or polyps no goiter or other thyroid problem in immediate family.    Social History: Reviewed history from 12/15/2009 and no changes required. 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         denies depression, cramps, sob, blurry vision, cough, chest pain, hematuria, memory loss, constipation, blurry vision, myalgias, dry skin, syncope.  she reports hair loss, and also numbness of the hands.   Physical Exam  General:  normal appearance.   Head:  head: no deformity eyes: no periorbital swelling, no proptosis external nose and ears are normal mouth: no lesion seen Neck:  a healed scar is present. there is a 1.5 cm diameter nodule at the right  anterior neck.    Lungs:  Clear to auscultation bilaterally. Normal respiratory effort.  Heart:  Regular rate and rhythm without murmurs or gallops noted. Normal S1,S2.   Msk:  muscle bulk and strength are grossly normal.  no obvious joint swelling.  gait is normal and steady  Extremities:  no edema no deformity Neurologic:  cn 2-12 grossly intact.   readily moves all 4's.   sensation is intact to touch on all 4's Skin:  normal texture and temp.  no rash.  not diaphoretic  Cervical Nodes:  No significant adenopathy.  Psych:  Alert and cooperative; normal mood and affect; normal attention span and concentration.   Additional Exam:  outside test results are  reviewed:  dec 2010: THYROID ULTRASOUND: Findings compatible with partial left thyroidectomy.  Right thyroid nodule.  TSH        [H]  5.135 uIU/mL     Impression & Recommendations:  Problem # 1:  GOITER, MULTINODULAR (ICD-241.1) benign  Problem # 2:  POSTSURGICAL HYPOTHYROIDISM (ICD-244.0) needs increased rx  Problem # 3:  cold intolerance and other sxs, not thyroid-related.  Other Orders: Radiology Referral (Radiology) Consultation Level IV [phone])  Patient Instructions: 1)  please adjust levothyroxine to instructions as per dr simpson--please note below.   2)  also as per dr Lodema Hong, please go to lab next month, for a repeat thyroid blood test.   3)  recheck thyroid ultrasound in 4 months.  you will be called with a day and time for an appointment. 4)  return here late 2012.  Preventive Care Screening  Pap Smear:    Date:  05/31/2010    Results:  normal   Mammogram:    Date:  07/31/2009    Results:  normal

## 2010-11-30 NOTE — Assessment & Plan Note (Signed)
Summary: right knee swelling- room 2   Vital Signs:  Patient profile:   60 year old female Menstrual status:  hysterectomy Height:      64 inches Weight:      180.75 pounds BMI:     31.14 O2 Sat:      99 % on Room air Pulse rate:   70 / minute Resp:     16 per minute BP sitting:   142 / 80  (left arm)  Vitals Entered By: Adella Hare LPN (January 13, 2010 4:04 PM) CC: right knee swelling Is Patient Diabetic? No Pain Assessment Patient in pain? no        Referring Provider:  Lodema Hong Primary Provider:  Lodema Hong, M.D.  CC:  right knee swelling.  History of Present Illness: Pt is here today due to swelling in her Rt knee. This is an intermittent prob for her.  She see's an orthopod  for this as needed.  Is here today hoping that we can drain the fluid off for her instead of ortho.  No redness.  No injury.  Hx of surgery Rt knee.  pt has no other complaints/concerns today.  Current Medications (verified): 1)  Lovastatin 40 Mg Tabs (Lovastatin) .... Take Two Tabs By Mouth At Bedtime For Cholesterol 2)  Pantoprazole Sodium 40 Mg Tbec (Pantoprazole Sodium) .... Take 1 Tablet By Mouth Two Times A Day 3)  Loratadine 10 Mg Tabs (Loratadine) .... Take 1 Tablet By Mouth Once A Day 4)  Levothyroxine Sodium 75 Mcg Tabs (Levothyroxine Sodium) .... Take 1 Tablet By Mouth Once A Day 5)  Furosemide 20 Mg Tabs (Furosemide) .... Take 1 Tablet By Mouth Once A Day As Needed 6)  Klor-Con M20 20 Meq Cr-Tabs (Potassium Chloride Crys Cr) .... Take 1 Tablet By Mouth Once A Day As Needed 7)  Diovan 160 Mg Tabs (Valsartan) .... One Tab By Mouth Qd 8)  Vimovo 500-20 Mg Tbec (Naproxen-Esomeprazole) .... Take 1 Tablet By Mouth Two Times A Day As Needed 9)  Carafate 1 Gm/74ml Susp (Sucralfate) .Marland Kitchen.. 10cc Before Meals and At Bedtime 10)  Dicyclomine Hcl 10 Mg Caps (Dicyclomine Hcl) .... Take 1 Capsule By Mouth Two Times A Day As Needed 11)  Vimobo  500/20 .... Take 1 Tablet By Mouth Two Times A Day 12)   Tramadol Hcl 50 Mg Tabs (Tramadol Hcl) .... Take 1 Tablet By Mouth Once A Day  Allergies (verified): 1)  ! Nsaids  Past History:  Past medical history reviewed for relevance to current acute and chronic problems.  Past Medical History: Reviewed history from 12/15/2009 and no changes required. CYSTITIS (ICD-595.9) OSTEOPENIA (ICD-733.90) SHOULDER PAIN (ICD-719.41) HYPOTHYROIDISM (ICD-244.9) URINARY INCONTINENCE (ICD-788.30) HYPERLIPIDEMIA (ICD-272.4) HYPERTENSION (ICD-401.9) TCS: 2005-Dr. Katrinka Blazing: WNLs  Review of Systems CV:  Denies chest pain or discomfort and shortness of breath with exertion. Resp:  Denies cough and shortness of breath. MS:  Complains of joint pain and joint swelling; denies joint redness.  Physical Exam  General:  Well-developed,well-nourished,in no acute distress; alert,appropriate and cooperative throughout examination Head:  Normocephalic and atraumatic without obvious abnormalities. No apparent alopecia or balding. Lungs:  Normal respiratory effort, chest expands symmetrically. Lungs are clear to auscultation, no crackles or wheezes. Heart:  Normal rate and regular rhythm. S1 and S2 normal without gallop, murmur, click, rub or other extra sounds. Msk:  Effusion Rt knee noted.  No erythema or warmth to touch.  Pt also noted to have swelling down lower leg to ankle area.   Pulses:  R posterior tibial normal and R dorsalis pedis normal.   Extremities:  right pretibial edema.   Neurologic:  alert & oriented X3 and sensation intact to light touch.   Skin:  Intact without suspicious lesions or rashes Psych:  Cognition and judgment appear intact. Alert and cooperative with normal attention span and concentration. No apparent delusions, illusions, hallucinations   Impression & Recommendations:  Problem # 1:  JOINT EFFUSION, RIGHT KNEE (ICD-719.06) Assessment Deteriorated Advised pt she needs to see her orthopod for aspiration of fluid from her knee.  I  recommend she try to call his office tonight or tomorrow am for an appt. Recommend she elevate & ice the knee this evening. Pt states she was recently told by GI she can no longer take NSAID's.  She has Tramadol if needed for pain. Offered off work note for tomorrow.  Pt declined.  Complete Medication List: 1)  Lovastatin 40 Mg Tabs (Lovastatin) .... Take two tabs by mouth at bedtime for cholesterol 2)  Pantoprazole Sodium 40 Mg Tbec (Pantoprazole sodium) .... Take 1 tablet by mouth two times a day 3)  Loratadine 10 Mg Tabs (Loratadine) .... Take 1 tablet by mouth once a day 4)  Levothyroxine Sodium 75 Mcg Tabs (Levothyroxine sodium) .... Take 1 tablet by mouth once a day 5)  Furosemide 20 Mg Tabs (Furosemide) .... Take 1 tablet by mouth once a day as needed 6)  Klor-con M20 20 Meq Cr-tabs (Potassium chloride crys cr) .... Take 1 tablet by mouth once a day as needed 7)  Diovan 160 Mg Tabs (Valsartan) .... One tab by mouth qd 8)  Vimovo 500-20 Mg Tbec (Naproxen-esomeprazole) .... Take 1 tablet by mouth two times a day as needed 9)  Carafate 1 Gm/23ml Susp (Sucralfate) .Marland Kitchen.. 10cc before meals and at bedtime 10)  Dicyclomine Hcl 10 Mg Caps (Dicyclomine hcl) .... Take 1 capsule by mouth two times a day as needed 11)  Vimobo 500/20  .... Take 1 tablet by mouth two times a day 12)  Tramadol Hcl 50 Mg Tabs (Tramadol hcl) .... Take 1 tablet by mouth once a day  Patient Instructions: 1)  Please schedule a follow-up appointment as needed. 2)  Rest, ice & elevate Rt leg. 3)  Use Tramadol as needed for pain. 4)  Call the orthopod's office for an appt.

## 2010-11-30 NOTE — Progress Notes (Signed)
Summary: [facility]   Imported By: Lind Guest 11/04/2009 08:28:03  _____________________________________________________________________  External Attachment:    Type:   Image     Comment:   External Document

## 2010-11-30 NOTE — Letter (Signed)
Summary: TRANSFERRED RECORDS  TRANSFERRED RECORDS   Imported By: Lind Guest 03/25/2010 09:51:01  _____________________________________________________________________  External Attachment:    Type:   Image     Comment:   External Document

## 2010-11-30 NOTE — Assessment & Plan Note (Signed)
Summary: office visit   Vital Signs:  Patient profile:   60 year old female Menstrual status:  hysterectomy Height:      64 inches Weight:      164.25 pounds BMI:     28.30 O2 Sat:      97 % on Room air Pulse rate:   68 / minute Pulse rhythm:   regular Resp:     16 per minute BP sitting:   130 / 80  (left arm)  Vitals Entered By: Mauricia Area CMA (September 15, 2010 4:33 PM)  Nutrition Counseling: Patient's BMI is greater than 25 and therefore counseled on weight management options.  O2 Flow:  Room air CC: Acid reflux bothering her off and on for past month Comments Did not bring meds   Primary Care Provider:  Lodema Hong, M.D.   CC:  Acid reflux bothering her off and on for past month.  History of Present Illness: Reports  that she is doing somewhat better as far as her reflux symptoms and has an upcoming appt at a tertiary center Denies recent fever or chills. Denies sinus pressure, nasal congestion , ear pain or sore throat. Denies chest congestion, or cough productive of sputum. Denies chest pain, palpitations, PND, orthopnea or leg swelling.  Denies change in bowel movements or bloody stool. Denies dysuria , frequency, incontinence or hesitancy. Denies  joint pain, swelling, or reduced mobility. Denies headaches, vertigo, seizures. Denies depression, anxiety or insomnia. Denies  rash, lesions, or itch.     Allergies (verified): 1)  ! Nsaids  Review of Systems      See HPI General:  Denies fatigue. Eyes:  Denies discharge and eye pain. GI:  Complains of abdominal pain, indigestion, and nausea; denies constipation and vomiting. Psych:  Complains of anxiety and depression; denies mental problems, suicidal thoughts/plans, thoughts of violence, and unusual visions or sounds; improved on medication. Endo:  Denies cold intolerance and excessive urination. Heme:  Denies abnormal bruising and bleeding. Allergy:  Complains of seasonal allergies.  Physical  Exam  General:  Well-developed,well-nourished,in no acute distress; alert,appropriate and cooperative throughout examination HEENT: No facial asymmetry,  EOMI, No sinus tenderness, TM's Clear, oropharynx  pink and moist.   Chest: Clear to auscultation bilaterally.  CVS: S1, S2, No murmurs, No S3.   Abd: Soft, Nontender.  MS: Adequate ROM spine, hips, shoulders and knees.  Ext: No edema.   CNS: CN 2-12 intact, power tone and sensation normal throughout.   Skin: Intact, no visible lesions or rashes.  Psych: Good eye contact, normal affect.  Memory intact, not anxious or depressed appearing.    Impression & Recommendations:  Problem # 1:  ANXIETY STATE, UNSPECIFIED (ICD-300.00) Assessment Improved  Her updated medication list for this problem includes:    Imipramine Hcl 25 Mg Tabs (Imipramine hcl) .Marland Kitchen... 2 by mouth at bedtime.    Lexapro 5 Mg Tabs (Escitalopram oxalate) .Marland Kitchen... Take 1 tablet by mouth once a day  Problem # 2:  DYSPEPSIA, NONULCERATIVE (ICD-536.8) Assessment: Improved  Problem # 3:  HYPERTENSION (ICD-401.9) Assessment: Unchanged  Her updated medication list for this problem includes:    Furosemide 20 Mg Tabs (Furosemide) .Marland Kitchen... Take 1 tablet by mouth once a day as needed    Diovan Hct 160-12.5 Mg Tabs (Valsartan-hydrochlorothiazide) .Marland Kitchen... Take 1 tablet by mouth once a day  BP today: 130/80 Prior BP: 124/70 (09/13/2010)  Labs Reviewed: K+: 4.6 (05/06/2010) Creat: : 0.90 (05/06/2010)   Chol: 178 (05/06/2010)   HDL: 50 (05/06/2010)  LDL: 98 (05/06/2010)   TG: 151 (05/06/2010)  Problem # 4:  HYPERLIPIDEMIA (ICD-272.4) Assessment: Improved  Her updated medication list for this problem includes:    Lovastatin 40 Mg Tabs (Lovastatin) .Marland Kitchen... Take two tabs by mouth at bedtime for cholesterol  Labs Reviewed: SGOT: 17 (05/06/2010)   SGPT: 19 (05/06/2010)   HDL:50 (05/06/2010), 61 (01/29/2010)  LDL:98 (05/06/2010), 112 (16/08/9603)  Chol:178 (05/06/2010), 196  (01/29/2010)  Trig:151 (05/06/2010), 117 (01/29/2010)  Complete Medication List: 1)  Lovastatin 40 Mg Tabs (Lovastatin) .... Take two tabs by mouth at bedtime for cholesterol 2)  Furosemide 20 Mg Tabs (Furosemide) .... Take 1 tablet by mouth once a day as needed 3)  Klor-con M20 20 Meq Cr-tabs (Potassium chloride crys cr) .... Take 1 tablet by mouth once a day as needed 4)  Levothyroxine Sodium 100 Mcg Tabs (Levothyroxine sodium) .... One tablet daly monday through friday, and half a tableton saturday and sunday 5)  Diovan Hct 160-12.5 Mg Tabs (Valsartan-hydrochlorothiazide) .... Take 1 tablet by mouth once a day 6)  Tramadol Hcl 50 Mg Tabs (Tramadol hcl) .... Take 1 tab by mouth at bedtime for 5 days then as needed for pain 7)  Carafate 1 Gm/14ml Susp (Sucralfate) .... 2 teaspoons 4 times daily, before meals and at bedtime 8)  Imipramine Hcl 25 Mg Tabs (Imipramine hcl) .... 2 by mouth at bedtime. 9)  Zofran 4 Mg Tabs (Ondansetron hcl) .Marland Kitchen.. 1 by mouth every 4 hours as needed nausea or vomiting 10)  Lexapro 5 Mg Tabs (Escitalopram oxalate) .... Take 1 tablet by mouth once a day  Patient Instructions: 1)  Please schedule a follow-up appointment in 3 months. 2)  I am happy that you are feeling better,no med changes   Orders Added: 1)  Est. Patient Level III [54098]

## 2010-11-30 NOTE — Assessment & Plan Note (Signed)
Summary: office visit   Vital Signs:  Patient profile:   60 year old female Menstrual status:  hysterectomy Height:      64 inches Weight:      177.75 pounds BMI:     30.62 O2 Sat:      98 % Pulse rate:   80 / minute Pulse rhythm:   regular Resp:     16 per minute BP sitting:   130 / 80  (left arm)  Vitals Entered By: Everitt Amber LPN (February 22, 2010 4:28 PM)  Nutrition Counseling: Patient's BMI is greater than 25 and therefore counseled on weight management options. CC: has been having cold flashes and staying sleepy alot. Thinks her thyroid is acting up    Primary Care Provider:  Lodema Hong, M.D.  CC:  has been having cold flashes and staying sleepy alot. Thinks her thyroid is acting up .  History of Present Illness: Reports  that she has been doing fairly well. She does state however that she has been feeling excessively cold as though her thyroid function is off. Denies recent fever or chills. Denies sinus pressure, nasal congestion , ear pain or sore throat. Denies chest congestion, or cough productive of sputum. Denies chest pain, palpitations, PND, orthopnea or leg swelling. Denies abdominal pain, nausea, vomitting, diarrhea or constipation. Denies change in bowel movements or bloody stool. Denies dysuria , frequency, incontinence or hesitancy.  Denies headaches, vertigo, seizures. Denies depression, anxiety or insomnia. Denies  rash, lesions, or itch.     Current Medications (verified): 1)  Lovastatin 40 Mg Tabs (Lovastatin) .... Take Two Tabs By Mouth At Bedtime For Cholesterol 2)  Pantoprazole Sodium 40 Mg Tbec (Pantoprazole Sodium) .... Take 1 Tablet By Mouth Two Times A Day 3)  Loratadine 10 Mg Tabs (Loratadine) .... Take 1 Tablet By Mouth Once A Day 4)  Levothyroxine Sodium 75 Mcg Tabs (Levothyroxine Sodium) .... Take 1 Tablet By Mouth Once A Day 5)  Furosemide 20 Mg Tabs (Furosemide) .... Take 1 Tablet By Mouth Once A Day As Needed 6)  Klor-Con M20 20 Meq  Cr-Tabs (Potassium Chloride Crys Cr) .... Take 1 Tablet By Mouth Once A Day As Needed 7)  Diovan 160 Mg Tabs (Valsartan) .... One Tab By Mouth Qd 8)  Carafate 1 Gm/32ml Susp (Sucralfate) .Marland Kitchen.. 10cc Before Meals and At Bedtime  Allergies (verified): 1)  ! Nsaids  Review of Systems      See HPI General:  Complains of fatigue and malaise. Eyes:  Denies discharge, eye pain, and red eye. MS:  Complains of joint pain and stiffness. Psych:  Denies anxiety and depression. Endo:  Complains of cold intolerance; denies excessive hunger, excessive thirst, excessive urination, heat intolerance, polyuria, and weight change. Heme:  Denies abnormal bruising and bleeding. Allergy:  Complains of seasonal allergies.  Physical Exam  General:  Well-developed,well-nourished,in no acute distress; alert,appropriate and cooperative throughout examination HEENT: No facial asymmetry,  EOMI, No sinus tenderness, TM's Clear, oropharynx  pink and moist.   Chest: Clear to auscultation bilaterally.  CVS: S1, S2, No murmurs, No S3.   Abd: Soft, Nontender.  MS: Adequate ROM spine, hips, shoulders and reduced in knees, in particular right knee which is swollen  Ext: No edema.   CNS: CN 2-12 intact, power tone and sensation normal throughout.   Skin: Intact, no visible lesions or rashes.  Psych: Good eye contact, normal affect.  Memory intact, not anxious or depressed appearing.     Impression & Recommendations:  Problem #  1:  JOINT EFFUSION, RIGHT KNEE (ICD-719.06) Assessment Deteriorated  will refer to ortho if pt desires  Orders: Orthopedic Referral (Ortho)  Problem # 2:  GERD (ICD-530.81)  The following medications were removed from the medication list:    Dicyclomine Hcl 10 Mg Caps (Dicyclomine hcl) .Marland Kitchen... Take 1 capsule by mouth two times a day as needed Her updated medication list for this problem includes:    Pantoprazole Sodium 40 Mg Tbec (Pantoprazole sodium) .Marland Kitchen... Take 1 tablet by mouth two  times a day    Carafate 1 Gm/49ml Susp (Sucralfate) .Marland KitchenMarland KitchenMarland KitchenMarland Kitchen 10cc before meals and at bedtime    Carafate 1 Gm/21ml Susp (Sucralfate) .Marland KitchenMarland KitchenMarland KitchenMarland Kitchen 10 cc before meals and at bedtime, dose adjusted aspt reported that she was runnung out early  Problem # 3:  HYPERTENSION (ICD-401.9) Assessment: Improved  Her updated medication list for this problem includes:    Furosemide 20 Mg Tabs (Furosemide) .Marland Kitchen... Take 1 tablet by mouth once a day as needed    Diovan 160 Mg Tabs (Valsartan) ..... One tab by mouth qd  Orders: T-Basic Metabolic Panel [id number])  BP today: 130/80 Prior BP: 142/80 (01/13/2010)  Labs Reviewed: K+: 3.9 (01/29/2010) Creat: : 0.95 (01/29/2010)   Chol: 196 (01/29/2010)   HDL: 61 (01/29/2010)   LDL: 112 (01/29/2010)   TG: 117 (01/29/2010)  Problem # 4:  HYPOTHYROIDISM (ICD-244.9) Assessment: Comment Only  Her updated medication list for this problem includes:    Levothyroxine Sodium 75 Mcg Tabs (Levothyroxine sodium) .Marland Kitchen... Take 1 tablet by mouth once a day  Orders: T-TSH ([id number])  Labs Reviewed: TSH: 1.793 (10/22/2009)    Chol: 196 (01/29/2010)   HDL: 61 (01/29/2010)   LDL: 112 (01/29/2010)   TG: 117 (01/29/2010)  Complete Medication List: 1)  Lovastatin 40 Mg Tabs (Lovastatin) .... Take two tabs by mouth at bedtime for cholesterol 2)  Pantoprazole Sodium 40 Mg Tbec (Pantoprazole sodium) .... Take 1 tablet by mouth two times a day 3)  Loratadine 10 Mg Tabs (Loratadine) .... Take 1 tablet by mouth once a day 4)  Levothyroxine Sodium 75 Mcg Tabs (Levothyroxine sodium) .... Take 1 tablet by mouth once a day 5)  Furosemide 20 Mg Tabs (Furosemide) .... Take 1 tablet by mouth once a day as needed 6)  Klor-con M20 20 Meq Cr-tabs (Potassium chloride crys cr) .... Take 1 tablet by mouth once a day as needed 7)  Diovan 160 Mg Tabs (Valsartan) .... One tab by mouth qd 8)  Carafate 1 Gm/74ml Susp (Sucralfate) .Marland Kitchen.. 10cc before meals and at bedtime 9)  Carafate 1 Gm/57ml Susp  (Sucralfate) .Marland Kitchen.. 10 cc before meals and at bedtime  Other Orders: T-CBC w/Diff ([id number]) T-Anemia Panel 3  (2904) T-Vitamin D (25-Hydroxy) [id number]) T-Hepatic Function [phone]) T-Lipid Profile [phone])  Patient Instructions: 1)  CPE July 3 or after. 2)  The quantitiy of carafate is increased to last 1 month. 3)  Pls try to eat small quantities of food regularly 4)  TSH prior to visit, ICD-9:  today, cbc and anemia panel and Vit D 5)  Fasting lipid and hepatic and chem 7 in July Prescriptions: CARAFATE 1 GM/10ML SUSP (SUCRALFATE) 10 cc before meals and at bedtime  #1254ml x 3   Entered and Authorized by:   Syliva Overman MD   Signed by:   Syliva Overman MD on 02/22/2010   Method used:   Printed then faxed to ...       [facility]* (retail)       [address]       [place], [place]  [address]       Ph: [phone]       Fax: [phone]   RxID:   [id number]

## 2010-11-30 NOTE — Assessment & Plan Note (Signed)
Summary: EPIGASTIRIC ABD PAIN   Visit Type:  Initial Consult Referring Provider:  Lodema Hong Primary Care Provider:  Lodema Hong, M.D.  Chief Complaint:  Abd pain.  History of Present Illness: In DEC stomach felt hot, had urge to go BR, nothings happened, then finally warm and formed BM. Stomach cramping bad. Most of pain in epigastrium. Happened twice and now it's gone. No vomtiing, blood in stool, fever, nausea, just pain. Pain lasted most of the day and woke her up. It just went away. Dr. Lodema Hong a pill and Carafate and it helped. BM: 1-2 times a day. Appetite pretty good. Heartburn in the AM. No Bentyl since Carafate started working. Been on Pantoprazole years. Vimono BID for a couple of months.  Current Medications (verified): 1)  Lovastatin 40 Mg Tabs (Lovastatin) .... Take Two Tabs By Mouth At Bedtime For Cholesterol 2)  Pantoprazole Sodium 40 Mg Tbec (Pantoprazole Sodium) .... Take 1 Tablet By Mouth Two Times A Day 3)  Loratadine 10 Mg Tabs (Loratadine) .... Take 1 Tablet By Mouth Once A Day 4)  Levothyroxine Sodium 75 Mcg Tabs (Levothyroxine Sodium) .... Take 1 Tablet By Mouth Once A Day 5)  Furosemide 20 Mg Tabs (Furosemide) .... Take 1 Tablet By Mouth Once A Day As Needed 6)  Klor-Con M20 20 Meq Cr-Tabs (Potassium Chloride Crys Cr) .... Take 1 Tablet By Mouth Once A Day As Needed 7)  Diovan 160 Mg Tabs (Valsartan) .... One Tab By Mouth Qd 8)  Vimovo 500-20 Mg Tbec (Naproxen-Esomeprazole) .... Take 1 Tablet By Mouth Two Times A Day As Needed 9)  Carafate 1 Gm/79ml Susp (Sucralfate) .Marland Kitchen.. 10cc Before Meals and At Bedtime 10)  Dicyclomine Hcl 10 Mg Caps (Dicyclomine Hcl) .... Take 1 Capsule By Mouth Two Times A Day As Needed 11)  Vimobo  500/20 .... Take 1 Tablet By Mouth Two Times A Day 12)  Tramadol Hcl 50 Mg Tabs (Tramadol Hcl) .... Take 1 Tablet By Mouth Once A Day  Allergies (verified): No Known Drug Allergies  Past History:  Past Medical History: CYSTITIS (ICD-595.9) OSTEOPENIA  (ICD-733.90) SHOULDER PAIN (ICD-719.41) HYPOTHYROIDISM (ICD-244.9) URINARY INCONTINENCE (ICD-788.30) HYPERLIPIDEMIA (ICD-272.4) HYPERTENSION (ICD-401.9) TCS: 2005-Dr. Katrinka BlazingArlyss Gandy  Past Surgical History: Reviewed history from 05/16/2008 and no changes required. partial hysterectomy TAH 1994 umbilical hernia repair Rt knee arthroscopy 2004  Family History: THREE CHILDREN LIVING MOTHER DECEASED   IN  CHILDBIRTH FATHER UNKNOWN NO BROTHERS  NO SISTERS No FH of Colon Cancer or polyps  Social History: EMPLOYED Married, 3 kids-youngest 38 Alcohol use-no Drug use-no PAST HISTORY OF TOBACCO: "cigarette bum"  Review of Systems       AUG 2009: 187 lbs  Vital Signs:  Patient profile:   60 year old female Menstrual status:  hysterectomy Height:      64 inches Weight:      186 pounds BMI:     32.04 Temp:     98.4 degrees F oral Pulse rate:   72 / minute BP sitting:   140 / 90  (left arm) Cuff size:   large  Vitals Entered By: Cloria Spring LPN (December 15, 2009 4:02 PM)  Physical Exam  General:  Well developed, well nourished, no acute distress. Head:  Normocephalic and atraumatic. Eyes:  PERRLA, no icterus. Mouth:  No deformity or lesions. Neck:  Supple; no masses. Lungs:  Clear throughout to auscultation. Heart:  Regular rate and rhythm; no murmurs  Impression & Recommendations:  Problem # 1:  ABDOMINAL PAIN, EPIGASTRIC (ICD-789.06) Assessment Improved  Pt has NSAID induced dyspepsia or breakthrough GERD. Weight stable. Sx reolved. Pt on 2 naproxen and ASA and 2 PPIs. Pt asked to d/c Protonix. May continue Vimono. OPV in 3 mos. If Sx persist, plan EGD. Obtain TCS report from Dr. Katrinka Blazing. Continue Carafate because receives Sx relief.    Orders: Consultation Level IV ([address]) Prescriptions: CARAFATE 1 GM/10ML SUSP (SUCRALFATE) 10cc before meals and at bedtime  #200 ml x 5   Entered and Authorized by:   West Bali MD   Signed by:   West Bali MD on  12/15/2009   Method used:   Electronically to        [facility] (retail)       [address]       [place], [place]  [address]       Ph: [phone]       Fax: [phone]   RxID:   [id number]      Appended Document: EPIGASTIRIC ABD PAIN ABD EXAM: Bowel Sounds present, nontender, non-distended

## 2010-11-30 NOTE — Assessment & Plan Note (Signed)
Summary: fmla papers   Allergies: 1)  ! Nsaids   Complete Medication List: 1)  Lovastatin 40 Mg Tabs (Lovastatin) .... Take two tabs by mouth at bedtime for cholesterol 2)  Furosemide 20 Mg Tabs (Furosemide) .... Take 1 tablet by mouth once a day as needed 3)  Klor-con M20 20 Meq Cr-tabs (Potassium chloride crys cr) .... Take 1 tablet by mouth once a day as needed 4)  Levothyroxine Sodium 100 Mcg Tabs (Levothyroxine sodium) .... One tablet daly monday through friday, and half a tableton saturday and sunday 5)  Diovan Hct 160-12.5 Mg Tabs (Valsartan-hydrochlorothiazide) .... Take 1 tablet by mouth once a day 6)  Tramadol Hcl 50 Mg Tabs (Tramadol hcl) .... Take 1 tab by mouth at bedtime for 5 days then as needed for pain 7)  Carafate 1 Gm/39ml Susp (Sucralfate) .... 2 teaspoons 4 times daily, before meals and at bedtime 8)  Imipramine Hcl 25 Mg Tabs (Imipramine hcl) .... 2 by mouth at bedtime. 9)  Zofran 4 Mg Tabs (Ondansetron hcl) .Marland Kitchen.. 1 by mouth every 4 hours as needed nausea or vomiting 10)  Lexapro 5 Mg Tabs (Escitalopram oxalate) .... Take 1 tablet by mouth once a day  Other Orders: Form Completion ([address])   Orders Added: 1)  Form Completion [[address]]

## 2010-11-30 NOTE — Letter (Signed)
Summary: [facility] APPT CONFIRMATION  [facility] APPT CONFIRMATION   Imported By: Ave Filter 08/26/2010 12:58:19  _____________________________________________________________________  External Attachment:    Type:   Image     Comment:   External Document  Appended Document: [facility] APPT CONFIRMATION Appt. 11/24/10.Marland KitchenMarland KitchenI called pt to give appt,no anser,lmom.

## 2010-11-30 NOTE — Assessment & Plan Note (Signed)
Summary: per Dr.Simpson for dizziness with orthostatic changes and bra...   Visit Type:  Initial Consult Referring Provider:  . Primary Provider:  Dr. Syliva Overman   History of Present Illness: [name] is seen for an initial visit at the kind request of Dr. Lodema Hong for assessment of dizziness and significant orthostasis.  This nice woman has enjoyed generally excellent health.  She has undergone multiple surgeries without difficulty.  She was recently seen in Dr. Anthony Sar office after developing dizziness.  This has been variously characterized as vertigo or lightheadedness.  She tells me that she felt as if she would faint, but also felt as if she was capable of driving a car.  Fortunately, she did not, and was transported by her husband.  Symptoms persisted for a number of hours, but subsequently resolved spontaneously and have not returned.  She has had no similar spells in the past.  Blood pressures in Dr. Anthony Sar office included a standing value of 100 systolic that was nearly 20 points lower than her lying BP.  Records from Dr. Anthony Sar office as well as records from [facility] were obtained and reviewed and are summarized herein.  Current Medications (verified): 1)  Lovastatin 40 Mg Tabs (Lovastatin) .... Take Two Tabs By Mouth At Bedtime For Cholesterol 2)  Pantoprazole Sodium 40 Mg Tbec (Pantoprazole Sodium) .... Take 1 Tablet By Mouth Two Times A Day 3)  Furosemide 20 Mg Tabs (Furosemide) .... Take 1 Tablet By Mouth Once A Day As Needed 4)  Klor-Con M20 20 Meq Cr-Tabs (Potassium Chloride Crys Cr) .... Take 1 Tablet By Mouth Once A Day As Needed 5)  Carafate 1 Gm/60ml Susp (Sucralfate) .Marland Kitchen.. 10cc Before Meals and At Bedtime 6)  Imipramine Hcl 10 Mg Tabs (Imipramine Hcl) .Marland Kitchen.. 1 By Mouth At Bedtime For 3 Days The 2 By Mouth At Bedtime For 3 Days Then 3 By Mouth Qhs 7)  Levothyroxine Sodium 100 Mcg Tabs (Levothyroxine Sodium) .... One Tablet Daly Monday Through  Friday, and Half A Tableton Saturday and Sunday 8)  Diovan Hct 160-12.5 Mg Tabs (Valsartan-Hydrochlorothiazide) .... Take 1 Tablet By Mouth Once A Day 9)  Tramadol Hcl 50 Mg Tabs (Tramadol Hcl) .... Take 1 Tab By Mouth At Bedtime For 5 Days Then As Needed For Pain  Allergies (verified): 1)  ! Nsaids  Past History:  Past Medical History: Last updated: 08/03/2010 Hypertension Hyperlipidemia Gastroesophageal reflux disease; history of gastritis Anemia CYSTITIS (ICD-595.9) OSTEOPENIA (ICD-733.90) SHOULDER PAIN (ICD-719.41) HYPOTHYROIDISM (ICD-244.9); history of Hashimoto's thyroiditis URINARY INCONTINENCE (ICD-788.30) TCS: 2005-Dr. Katrinka Blazing: WNLs  Family History: Last updated: 2010-08-03  MOTHER DECEASED IN CHILDBIRTH at age 97 FATHER UNKNOWN NO BROTHERS  NO SISTERS No FH of Colon Cancer or polyps No goiter or other thyroid problem in immediate family.    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    Social History: Employed in a [employer] producing automobile parts Married, 3 kids-youngest 38  Alcohol use-no Drug use-no PAST HISTORY OF TOBACCO: "cigarette bum"  Review of Systems       Requires corrective lenses; appetite has been poor of late, but there has been no significant weight loss; history of hiatal hernia and gastroesophageal  reflux disease; arthritic discomfort in her left hand intermittently.  All other systems reviewed and are negative.  Vital Signs:  Patient profile:   60 year old female Menstrual status:  hysterectomy Weight:      172 pounds BMI:     29.63 Pulse rate:   66 / minute Pulse (ortho):   71 / minute BP sitting:   113 / 71  (right arm) BP standing:   113 / 77  Vitals Entered By: Dreama Saa, CNA (July 08, 2010 11:06 AM)  Serial Vital Signs/Assessments:  Time      Position  BP       Pulse  Resp  Temp     By 11:42 AM  Lying RA  117/69   [address], CNA 11:42 AM  Sitting   114/71   [address] CNA 11:42 AM  Standing  113/77   71                    Sandy Neugent, CNA   Physical Exam  General:  Overweight; well-developed; no acute distress: HEENT-[place]/AT; PERRL; EOM intact; conjunctiva and lids nl:  Neck-No JVD; no carotid bruits: Endocrine-+ diffuse moderate thyromegaly: Lungs-No tachypnea, clear without rales, rhonchi or wheezes: CV-normal PMI; normal S1 and S2; modest systolic ejection murmur at the cardiac base Abdomen-BS normal; soft and non-tender without masses or organomegaly: MS-No deformities, cyanosis or clubbing: Neurologic-Nl cranial nerves; symmetric strength and tone: Skin- Warm, no sig. lesions: Extremities-Nl distal pulses; no edema    Impression & Recommendations:  Problem # 1:  DIZZINESS (ICD-780.4) Episode was transient and self-limited; the etiology cannot be determined after the fact.  She developed her symptoms following addion of hydrochlorothiazide to her medical regime.  She certainly may have had transient dehydration that has now resolved.  Alternatively, she may have experienced an episode of vertigo or a minor infectious disease.  In any case, she is now asymptomatic with normal supine and standing blood pressures.  No further evaluation appears necessary.  Problem # 2:  HYPERTENSION  (ICD-401.9) Blood pressure control is now excellent.  As long as dizziness and orthostasis do not return, I would continue her current medication.  Problem # 3:  HYPERLIPIDEMIA (ICD-272.4) Patient has been treated with pharmacologic therapy since at least 2007.  Her risk for coronary disease is relatively low, and pharmacologic therapy is not necessary unless LDL is greater than 190 off therapy.  I will leave it to Dr. Lodema Hong to determine whether she would like to measure lipids off therapy to decide whether continuing drug treatment is needed.  I will be available to assess this very lovely woman at any time in the future that Dr. Lodema Hong deems appropriate.  EKG  Procedure date:  07/08/2010  Findings:      Sinus bradycardia at a rate of 57 bpm Left atrial abnormality Slightly delayed R-wave progression Otherwise within normal limits.   Patient Instructions: 1)  Your physician recommends that you schedule a follow-up appointment in: as  needed  2)  Your physician recommends that you continue on your current medications as directed. Please refer to the Current Medication list given to you today.

## 2010-11-30 NOTE — Progress Notes (Signed)
Summary: pt stopped dexilant and changed to protonix  Phone Note Call from Patient Call back at Home Phone [phone]   Caller: Patient Summary of Call: FYI Called pt to inform her that we have been working on PA we received from pharmacy for [employer]. pt stated she didnt like the Dexilant and that it gets stuck in her throat and she stopped Dexilant and restarted Protonix. she stated protonix was working great.  Initial call taken by: Hendricks Limes LPN,  September 08, 2010 4:23 PM

## 2010-11-30 NOTE — Progress Notes (Signed)
Summary: CALL BACK  Phone Note Call from Patient   Summary of Call: RETURNED YOUR CALL CALL BACK Initial call taken by: Lind Guest,  August 16, 2010 4:27 PM  Follow-up for Phone Call        Called patient, left message Follow-up by: Mauricia Area CMA,  August 17, 2010 10:13 AM  Additional Follow-up for Phone Call Additional follow up Details #1::        Patient aware Additional Follow-up by: Mauricia Area CMA,  August 17, 2010 1:56 PM

## 2010-11-30 NOTE — Assessment & Plan Note (Signed)
Summary: DYSPEPSIA   Visit Type:  Follow-up Visit Primary Care Provider:  Lodema Hong, M.D.   Chief Complaint:  follow up- still having alot of gas.  History of Present Illness: Bowels moving 1x/day. Bloating bothers always. sX x>1 YEAR.Passes gas from below and above. Feels tight in the chest. 3x/week. Cheese: not really. Ice cream-none. Heartburn: in AM associated with nausea. Passes after 45 mins. No diarrhea or vomiting. Drinks EtOH. Rare fried foods.  Current Medications (verified): 1)  Lovastatin 40 Mg Tabs (Lovastatin) .... Take Two Tabs By Mouth At Bedtime For Cholesterol 2)  Pantoprazole Sodium 40 Mg Tbec (Pantoprazole Sodium) .... Take 1 Tablet By Mouth Two Times A Day 3)  Levothyroxine Sodium 75 Mcg Tabs (Levothyroxine Sodium) .... Take 1 Tablet By Mouth Once A Day 4)  Furosemide 20 Mg Tabs (Furosemide) .... Take 1 Tablet By Mouth Once A Day As Needed 5)  Klor-Con M20 20 Meq Cr-Tabs (Potassium Chloride Crys Cr) .... Take 1 Tablet By Mouth Once A Day As Needed 6)  Diovan 160 Mg Tabs (Valsartan) .... One Tab By Mouth Qd 7)  Carafate 1 Gm/74ml Susp (Sucralfate) .Marland Kitchen.. 10cc Before Meals and At Bedtime  Allergies (verified): 1)  ! Nsaids  Past History:  Past Medical History: Last updated: 12/15/2009 CYSTITIS (ICD-595.9) OSTEOPENIA (ICD-733.90) SHOULDER PAIN (ICD-719.41) HYPOTHYROIDISM (ICD-244.9) URINARY INCONTINENCE (ICD-788.30) HYPERLIPIDEMIA (ICD-272.4) HYPERTENSION (ICD-401.9) TCS: 2005-Dr. Katrinka Blazing: WNLs  Review of Systems       2010: ABD U/S AND HIDA SCAN NORMAL  Vital Signs:  Patient profile:   60 year old female Menstrual status:  hysterectomy Height:      64 inches Weight:      178 pounds BMI:     30.66 Temp:     98.4 degrees F oral Pulse rate:   76 / minute BP sitting:   132 / 90  (left arm) Cuff size:   regular  Vitals Entered By: Hendricks Limes LPN (Mar 11, 2010 4:11 PM)  Physical Exam  General:  Well developed, well nourished, no acute distress. Head:   Normocephalic and atraumatic. Eyes:  PERRLA, no icterus. Mouth:  No deformity or lesions. Neck:  Supple; no masses. Lungs:  Clear throughout to auscultation. Heart:  Regular rate and rhythm; no murmurs. Abdomen:  Soft, MOD TTP IN EPIGASTRIUM, nondistended. No masses, hepatosplenomegaly or hernias noted. Normal bowel sounds. Extremities:  No edema or deformities noted. Neurologic:  Alert and  oriented x4;  grossly normal neurologically.  Impression & Recommendations:  Problem # 1:  ABDOMINAL BLOATING (ICD-787.3)   and epigastric abd pain 2o to functional gut disorder. HO given. Avoid items causing bloating. Follow a low fat diet. No milk. If need milk, use Lactaid. Take Digestive Advantage GDF daily. Return visit in 2 mos. MAY BENEFIT FROM A TCA.  CC: PCP  Orders: Est. Patient Level IV (81191)  Patient Instructions: 1)  Avoid items causing bloating. Follow a low fat diet. 2)  No milk. If need milk, use Lactaid. 3)  Take Digestive Advantage GDF daily. 4)  Return visit in 2 mos. 5)  The medication list was reviewed and reconciled.  All changed / newly prescribed medications were explained.  A complete medication list was provided to the patient / caregiver.  Appended Document: DYSPEPSIA pt aware of appt 7/12 @ 415 w/SF

## 2010-11-30 NOTE — Letter (Signed)
Summary: [name] REFERR[facility]  [facility] REFERRAL   Imported By: Ave Filter 08/23/2010 10:29:26  _____________________________________________________________________  External Attachment:    Type:   Image     Comment:   External Document  Appended Document: [facility] REFERRAL [name] at [facility] called amd said pt called her wanting to be seen ASAP for pain.Marland Kitchen

## 2010-11-30 NOTE — Progress Notes (Signed)
  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 Initial call taken by: Adella Hare LPN,  August 20, 2010 2:30 PM  Follow-up for Phone Call        these forms caqnnot be filled out before next qweek, pls let her kn ow, I am mwilling to do this but not before next week when i am back in the office Follow-up by: Syliva Overman MD,  August 20, 2010 3:45 PM  Additional Follow-up for Phone Call Additional follow up Details #1::        called and spoke with patient, she said next week will be fine. Additional Follow-up by: Curtis Sites,  August 20, 2010 3:57 PM    Additional Follow-up for Phone Call Additional follow up Details #2::    noted Follow-up by: Syliva Overman MD,  August 23, 2010 6:08 AM

## 2010-11-30 NOTE — Assessment & Plan Note (Signed)
Summary: BLOATING, EPIGASTRIC PAIN   Visit Type:  Follow-up Visit Primary Care Provider:  Lodema Hong, M.D.  Chief Complaint:  2 month follow up.  History of Present Illness: Only one complaint. Took Gas pill after eating and felt nauseous. Pill does help. A little imaptient with the pill. Bloating is better. Still has probiotics. Cut out dairy for the most part but rarely needs to drink milk. Heartburn controlled with Protonix. Eating small meals.   Current Medications (verified): 1)  Lovastatin 40 Mg Tabs (Lovastatin) .... Take Two Tabs By Mouth At Bedtime For Cholesterol 2)  Pantoprazole Sodium 40 Mg Tbec (Pantoprazole Sodium) .... Take 1 Tablet By Mouth Two Times A Day 3)  Levothyroxine Sodium 75 Mcg Tabs (Levothyroxine Sodium) .... Take 1 Tablet By Mouth Once A Day 4)  Furosemide 20 Mg Tabs (Furosemide) .... Take 1 Tablet By Mouth Once A Day As Needed 5)  Klor-Con M20 20 Meq Cr-Tabs (Potassium Chloride Crys Cr) .... Take 1 Tablet By Mouth Once A Day As Needed 6)  Diovan 160 Mg Tabs (Valsartan) .... One Tab By Mouth Qd 7)  Carafate 1 Gm/30ml Susp (Sucralfate) .Marland Kitchen.. 10cc Before Meals and At Bedtime  Allergies (verified): 1)  ! Nsaids  Past History:  Past Medical History: Last updated: 12/15/2009 CYSTITIS (ICD-595.9) OSTEOPENIA (ICD-733.90) SHOULDER PAIN (ICD-719.41) HYPOTHYROIDISM (ICD-244.9) URINARY INCONTINENCE (ICD-788.30) HYPERLIPIDEMIA (ICD-272.4) HYPERTENSION (ICD-401.9) TCS: 2005-Dr. Katrinka BlazingArlyss Gandy  Past Surgical History: Last updated: 05/16/2008 partial hysterectomy TAH 1994 umbilical hernia repair Rt knee arthroscopy 2004  Review of Systems       Pain in left side from repetative movement at work.  Vital Signs:  Patient profile:   60 year old female Menstrual status:  hysterectomy Height:      64 inches Weight:      175 pounds BMI:     30.15 Temp:     98.3 degrees F oral Pulse rate:   60 / minute BP sitting:   118 / 84  (left arm) Cuff size:    regular  Vitals Entered By: Hendricks Limes LPN (May 11, 2010 3:24 PM)  Physical Exam  General:  Well developed, well nourished, no acute distress. Head:  Normocephalic and atraumatic. Lungs:  Clear throughout to auscultation. Heart:  Regular rate and rhythm; no murmurs. Abdomen:  Soft, mild TTP in epigastrium, and nondistended. No masses, hepatosplenomegaly or hernias noted. Normal bowel sounds.  Impression & Recommendations:  Problem # 1:  ABDOMINAL BLOATING (ICD-787.3) Assessment Improved Continue Probiotics. Lose 10 lbs. OPV in 4 mos. Avoid dairy as much as possible. Takes Caltrate once daily.   Problem # 2:  ABDOMINAL PAIN, EPIGASTRIC (ICD-789.06) likely 2o to IBS, non-ulcer, dyspepsia, and/or abd wall pain. Add Imipramine at bedtime. Med warnings given.  CC: PCP Prescriptions: IMIPRAMINE HCL 10 MG TABS (IMIPRAMINE HCL) 1 by mouth at bedtime for 3 days the 2 by mouth at bedtime for 3 days then 3 by mouth qhs  #90 x 5   Entered and Authorized by:   West Bali MD   Signed by:   West Bali MD on 05/11/2010   Method used:   Electronically to        [facility] (retail)       [address] [place], [place]  [address]       Ph: [phone]       Fax: [phone]   RxID:   [id number]  Appended Document: Orders Update    Clinical Lists Changes  Orders: Added new Service order of Est. Patient Level III ([address]) - Signed      Appended Document: BLOATING, EPIGASTRIC PAIN REMINDER IN COMPUTER

## 2010-11-30 NOTE — Assessment & Plan Note (Signed)
Summary: NUD, GERD   Visit Type:  Follow-up Visit Primary Care Provider:  Lodema Hong, M.D.   Chief Complaint:  gas/nausea.  History of Present Illness: OCT 4: sick and throwing up, OCT 5: burping, "heat coming from her throat". Saw Dr. Lodema Hong gave her shot of Toradol, Zof, Depomedrol for chest wall pain.  Not eating for about a week. Drinking fluids. Taking IMP 3 at bedtime. No BM since last week until today: hard, no loose. No problems swallowing. Sx bother you more in the day. No problems sleeping. No ASA, BC, Goody's, BC, Ibuprofen, or ALeve. Using Tylenol and Tramadol for chest wall pain. HAVING much stress on the job.  Current Medications (verified): 1)  Lovastatin 40 Mg Tabs (Lovastatin) .... Take Two Tabs By Mouth At Bedtime For Cholesterol 2)  Pantoprazole Sodium 40 Mg Tbec (Pantoprazole Sodium) .... Take 1 Tablet By Mouth Two Times A Day 3)  Furosemide 20 Mg Tabs (Furosemide) .... Take 1 Tablet By Mouth Once A Day As Needed 4)  Klor-Con M20 20 Meq Cr-Tabs (Potassium Chloride Crys Cr) .... Take 1 Tablet By Mouth Once A Day As Needed 5)  Imipramine Hcl 10 Mg Tabs (Imipramine Hcl) .Marland Kitchen.. 1 By Mouth At Bedtime For 3 Days The 2 By Mouth At Bedtime For 3 Days Then 3 By Mouth Qhs 6)  Levothyroxine Sodium 100 Mcg Tabs (Levothyroxine Sodium) .... One Tablet Daly Monday Through Friday, and Half A Tableton Saturday and "Sunday 7)  Diovan Hct 160-12.5 Mg Tabs (Valsartan-Hydrochlorothiazide) .... Take 1 Tablet By Mouth Once A Day 8)  Tramadol Hcl 50 Mg Tabs (Tramadol Hcl) .... Take 1 Tab By Mouth At Bedtime For 5 Days Then As Needed For Pain 9)  Carafate 1 Gm/10ml Susp (Sucralfate) .... 2 Teaspoons 4 Times Daily, Before Meals and At Bedtime  Allergies (verified): 1)  ! Nsaids  Past History:  Past Medical History: Gastroesophageal reflux disease; history of gastritis **EGD/bx- MAR 2011  Hypertension Hyperlipidemia Anemia CYSTITIS (ICD-595.9) OSTEOPENIA (ICD-733.90) SHOULDER PAIN  (ICD-719.41) HYPOTHYROIDISM (ICD-244.9); history of Hashimoto's thyroiditis URINARY INCONTINENCE (ICD-788.30) TCS: 2005-Dr. Smith: WNLs  Vital Signs:  Patient profile:   59 year old female Menstrual status:  hysterectomy Height:      64 inches Weight:      169 pounds BMI:     29.11 Temp:     98" .1 degrees F oral Pulse rate:   60 / minute BP sitting:   120 / 80  (left arm) Cuff size:   regular  Vitals Entered By: Cloria Spring LPN (August 12, 2010 2:07 PM)  Physical Exam  General:  Well developed, well nourished, no acute distress. Head:  Normocephalic and atraumatic. Eyes:  PERRL, no icterus. Mouth:  No deformity or lesions. Neck:  Supple; no masses. Lungs:  Clear throughout to auscultation. Heart:  Regular rate and rhythm; no murmurs. Abdomen:  Soft, MILD TTP IN THE EPIGASTRIUM, nondistended. Normal bowel sounds. Extremities:  No edema noted. Neurologic:  Alert and  oriented x4;  grossly normal neurologically.  Impression & Recommendations:  Problem # 1:  DYSPEPSIA, NONULCERATIVE (ICD-536.8) Assessment Deteriorated Exacerbated by job-related stress. Increase IMP. Diet modification. Recommended stress managment counselling. ZOF prn. OPV in 6 weeks.  Problem # 2:  GERD (ICD-530.81) Assessment: Deteriorated Sx may be exacerbated by uncontrolled reflux. Change to Dexilant. Samples and Rx given  CC: PCP  Patient Instructions: 1)  Increase Imipraimine to 4 tabs at night for 3 days the 5 tabs at night. 2)  After current pills run out then  take Imipramine 25 mg 2  at bedtime. 3)  Change to Dexilant daily. 4)  Follow a low fat diet, but if having pain/nausea, and vomiting, then drink ENSURE PLUS four cans daily. 5)  Follow up in 6 weeks. 6)  The medication list was reviewed and reconciled.  All changed / newly prescribed medications were explained.  A complete medication list was provided to the patient / caregiver. Prescriptions: ZOFRAN 4 MG TABS (ONDANSETRON HCL) 1 by  mouth q4h as needed nausea or vomiting  #30 x 5   Entered and Authorized by:   West Bali MD   Signed by:   West Bali MD on 08/12/2010   Method used:   Electronically to        [facility] (retail)       [address] [place], [place]  [address]       Ph: [phone]       Fax: [phone]   RxID:   [id number] DEXILANT 60 MG CPDR (DEXLANSOPRAZOLE) 1 by mouth with first meal.  #30 x 5   Entered and Authorized by:   West Bali MD   Signed by:   West Bali MD on 08/12/2010   Method used:   Electronically to        [facility] (retail)       [address] [place], [place]  [address]       Ph: [phone]       Fax: [phone]   RxID:   [id number] IMIPRAMINE HCL 25 MG TABS (IMIPRAMINE HCL) 2 by mouth qhs  #60 x 5   Entered and Authorized by:   West Bali MD   Signed by:   West Bali MD on 08/12/2010   Method used:   Electronically to        [facility] (retail)       [address]       [place], [place]  [address]       Ph: [phone]       Fax: [phone]   RxID:   [id number]   Appended Document: NUD, GERD F/U REMINDER IS IN THE COMPUTER  Appended Document: Orders Update    Clinical Lists Changes  Orders: Added new Service order of Est. Patient Level IV (51761) - Signed

## 2010-11-30 NOTE — Letter (Signed)
Summary: transferred records  transferred records   Imported By: Lind Guest 03/25/2010 11:12:10  _____________________________________________________________________  External Attachment:    Type:   Image     Comment:   External Document

## 2010-11-30 NOTE — Letter (Signed)
Summary: Out of Work Note  [facility]  [address]   [place], [place] [address]   Phone: [phone]  Fax: [phone]    09/13/2010  TO: Leodis Sias IT MAY CONCERN  RE: Laura [name] [address] [id number] 23-Dec-1950       The above named individual is currently under my care and will be out of work    FROM: 09/13/2010    REASON: was seen in our office today    MAY RETURN ON: 09/14/2010     If you have any further questions or need additional information, please call.     Sincerely,     [facility] R. Roetta Sessions, M.D.    Jonette Eva, M.D. Lorenza Burton, FNP-BC    Tana Coast, PA-C Phone: [phone]    Fax: [phone]

## 2010-11-30 NOTE — Assessment & Plan Note (Signed)
Summary: ov   Vital Signs:  Patient profile:   60 year old female Menstrual status:  hysterectomy Height:      64 inches Weight:      163.25 pounds BMI:     28.12 O2 Sat:      100 % on Room air Pulse rate:   79 / minute Pulse rhythm:   regular Resp:     16 per minute BP sitting:   122 / 82  (left arm)  Vitals Entered By: Mauricia Area CMA (August 23, 2010 8:40 AM)  Nutrition Counseling: Patient's BMI is greater than 25 and therefore counseled on weight management options.  O2 Flow:  Room air CC: acid reflux   Primary Care Provider:  Lodema Hong, M.D.   CC:  acid reflux.  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 Take Two Tabs By Mouth At Bedtime For Cholesterol 2)  Furosemide 20 Mg Tabs (Furosemide) .... Take 1 Tablet By Mouth Once A Day As Needed 3)  Klor-Con M20 20 Meq Cr-Tabs (Potassium Chloride Crys Cr) .... Take 1 Tablet By Mouth Once A Day As Needed 4)  Levothyroxine Sodium 100 Mcg Tabs (Levothyroxine Sodium) .... One Tablet Daly Monday Through Friday, and Half A Tableton Saturday and Sunday 5)  Diovan Hct 160-12.5 Mg Tabs (Valsartan-Hydrochlorothiazide) .... Take 1 Tablet By Mouth Once A Day 6)  Tramadol Hcl 50 Mg Tabs (Tramadol Hcl) .... Take 1 Tab By Mouth At Bedtime For 5 Days Then As Needed For Pain 7)   Carafate 1 Gm/65ml Susp (Sucralfate) .... 2 Teaspoons 4 Times Daily, Before Meals and At Bedtime 8)  Imipramine Hcl 25 Mg Tabs (Imipramine Hcl) .... 2 By Mouth At Bedtime. 9)  Zofran 4 Mg Tabs (Ondansetron Hcl) .Marland Kitchen.. 1 By Mouth Every 4 Hours As Needed Nausea or Vomiting  Allergies (verified): 1)  ! Nsaids  Review of Systems      See HPI General:  Complains of fatigue and sleep disorder. Eyes:  Denies discharge and red eye. ENT:  Denies hoarseness and nasal congestion. CV:  Denies chest pain or discomfort, palpitations, and swelling of feet. Resp:  Denies cough and sputum productive. GI:  Complains of abdominal pain and indigestion; denies constipation and diarrhea. GU:  Denies dysuria and urinary frequency. MS:  Denies joint pain and joint swelling. Derm:  Denies itching and rash. Neuro:  Denies headaches, seizures, sensation of room spinning, and tingling. Psych:  Complains of anxiety; denies depression. Endo:  Denies cold intolerance, excessive hunger, excessive thirst, and excessive urination. Heme:  Denies abnormal bruising and bleeding. Allergy:  Complains of seasonal allergies.  Physical Exam  General:  Well-developed,well-nourished,in no acute distress; alert,appropriate and cooperative throughout examination HEENT: No facial asymmetry,  EOMI, No sinus tenderness, TM's Clear, oropharynx  pink and moist.   Chest: Clear to auscultation bilaterally.  CVS: S1, S2, No  murmurs, No S3.   Abd: Soft,mild epigastric tenderness, no guarding or rebound, bowel sounds normal MS: Adequate ROM spine, hips, shoulders and knees.  Ext: No edema.   CNS: CN 2-12 intact, power tone and sensation normal throughout.   Skin: Intact, no visible lesions or rashes.  Psych: Good eye contact, normal affect.  Memory intact, anxious not depressed appearing.    Impression & Recommendations:  Problem # 1:  ANXIETY STATE, UNSPECIFIED (ICD-300.00) Assessment Deteriorated  Her updated medication list  for this problem includes:    Imipramine Hcl 25 Mg Tabs (Imipramine hcl) .Marland Kitchen... 2 by mouth at bedtime.    Lexapro 5 Mg Tabs (Escitalopram oxalate) .Marland Kitchen... Take 1 tablet by mouth once a day  Problem # 2:  DYSPEPSIA, NONULCERATIVE (ICD-536.8) Assessment: Deteriorated pt continues to c/o pain, poor apetite and is losing weight , states unable to work, awaiting eval at tertiary center   Problem # 3:  HYPERLIPIDEMIA (ICD-272.4) Assessment: Unchanged  Her updated medication list for this problem includes:    Lovastatin 40 Mg Tabs (Lovastatin) .Marland Kitchen... Take two tabs by mouth at bedtime for cholesterol Low fat dietdiscussed and encouraged  Labs Reviewed: SGOT: 17 (05/06/2010)   SGPT: 19 (05/06/2010)   HDL:50 (05/06/2010), 61 (01/29/2010)  LDL:98 (05/06/2010), 112 (16/08/9603)  Chol:178 (05/06/2010), 196 (01/29/2010)  Trig:151 (05/06/2010), 117 (01/29/2010)  Problem # 4:  HYPERTENSION (ICD-401.9) Assessment: Unchanged  Her updated medication list for this problem includes:    Furosemide 20 Mg Tabs (Furosemide) .Marland Kitchen... Take 1 tablet by mouth once a day as needed    Diovan Hct 160-12.5 Mg Tabs (Valsartan-hydrochlorothiazide) .Marland Kitchen... Take 1 tablet by mouth once a day  BP today: 122/82 Prior BP: 120/80 (08/12/2010)  Labs Reviewed: K+: 4.6 (05/06/2010) Creat: : 0.90 (05/06/2010)   Chol: 178 (05/06/2010)   HDL: 50 (05/06/2010)   LDL: 98 (05/06/2010)   TG: 151 (05/06/2010)  Complete Medication List: 1)  Lovastatin 40 Mg Tabs (Lovastatin) .... Take two tabs by mouth at bedtime for cholesterol 2)  Furosemide 20 Mg Tabs (Furosemide) .... Take 1 tablet by mouth once a day as needed 3)  Klor-con M20 20 Meq Cr-tabs (Potassium chloride crys cr) .... Take 1 tablet by mouth once a day as needed 4)  Levothyroxine Sodium 100 Mcg Tabs (Levothyroxine sodium) .... One tablet daly monday through friday, and half a tableton saturday and sunday 5)  Diovan Hct 160-12.5 Mg Tabs (Valsartan-hydrochlorothiazide) .... Take 1  tablet by mouth once a day 6)  Tramadol Hcl 50 Mg Tabs (Tramadol hcl) .... Take 1 tab by mouth at bedtime for 5 days then as needed for pain 7)  Carafate 1 Gm/29ml Susp (Sucralfate) .... 2 teaspoons 4 times daily, before meals and at bedtime 8)  Imipramine Hcl 25 Mg Tabs (Imipramine hcl) .... 2 by mouth at bedtime. 9)  Zofran 4 Mg Tabs (Ondansetron hcl) .Marland Kitchen.. 1 by mouth every 4 hours as needed nausea or vomiting 10)  Lexapro 5 Mg Tabs (Escitalopram oxalate) .... Take 1 tablet by mouth once a day  Patient Instructions: 1)  F/u as before. 2)  Work excuse to return in 1 week. 3)  Pls call gI for your appt at [facility]. 4)  pls start new med for anxiety and stresss which has been sent to your pharmacy today Prescriptions: LEXAPRO 5 MG TABS (ESCITALOPRAM OXALATE) Take 1 tablet by mouth once a day  #30 x 3   Entered and Authorized by:   Syliva Overman MD   Signed by:  Syliva Overman MD on 08/23/2010   Method used:   Electronically to        [facility] (retail)       [address]       [place], [place]  [address]       Ph: [phone]       Fax: [phone]   RxID:   [id number]    Orders Added: 1)  Est. Patient Level IV [27[address]]

## 2010-11-30 NOTE — Letter (Signed)
Summary: Out of Work  [facility]  [address]   [place], [place] [address]   Phone: [phone]  Fax: [phone]    December 21, 2009   Employee:  ALYISSA WHIDBEE    To Whom It May Concern:   For Medical reasons, please excuse the above named employee from work for the following dates:  Start:   12-21-09  due to Influenza  End:   May return to work when fever free x 24 hrs without the use of fever reducing medication.  If you need additional information, please feel free to contact our office.         Sincerely,    Esperanza Sheets PA

## 2010-11-30 NOTE — Progress Notes (Signed)
Summary: note for work  Phone Note Call from Patient   Summary of Call: Patient called in and states she isn't any better and would like a note to continue to be out of work.  Asked to get one through Monday.  Please advise. Initial call taken by: Curtis Sites,  August 26, 2010 10:02 AM  Follow-up for Phone Call        pls give new note for pt to retuern as requested on 1031/2011, she absolutely needs to be seeing a specialist about this, i will be unable o repeatedly extend her work notes, she needs to understand this Follow-up by: Syliva Overman MD,  August 26, 2010 12:35 PM  Additional Follow-up for Phone Call Additional follow up Details #1::        patient already has note taking her out until 10/31, she wants an additional week and is waiting to hear from specialist Additional Follow-up by: Adella Hare LPN,  August 26, 2010 2:16 PM    Additional Follow-up for Phone Call Additional follow up Details #2::    pl srequest thaat she have her local gI docs provide that long extension since they are the ones whio are referring [facility] to [facility], see if this can be done Follow-up by: Syliva Overman MD,  August 26, 2010 2:55 PM  Additional Follow-up for Phone Call Additional follow up Details #3:: Details for Additional Follow-up Action Taken: patient aware Additional Follow-up by: Adella Hare LPN,  August 27, 2010 2:50 PM

## 2010-11-30 NOTE — Letter (Signed)
Summary: MEDICAL RELEASE  MEDICAL RELEASE   Imported By: Lind Guest 11/02/2009 14:14:07  _____________________________________________________________________  External Attachment:    Type:   Image     Comment:   External Document

## 2010-11-30 NOTE — Assessment & Plan Note (Signed)
Summary: office visit   Vital Signs:  Patient profile:   60 year old female Menstrual status:  hysterectomy Height:      64 inches Weight:      172.25 pounds BMI:     29.67 O2 Sat:      96 % on Room air Pulse rate:   65 / minute Pulse rhythm:   regular Resp:     16 per minute BP sitting:   110 / 80  (left arm)  Vitals Entered By: Adella Hare LPN (June 14, 2010 4:36 PM)  Nutrition Counseling: Patient's BMI is greater than 25 and therefore counseled on weight management options.  O2 Flow:  Room air CC: follow-up visit Is Patient Diabetic? No Pain Assessment Patient in pain? no      Comments did not bring meds to ov   Primary Care Arali Somera:  Lodema Hong, M.D.   CC:  follow-up visit.  History of Present Illness: Crying spell once yesterday Tender soreness under left arm since yesterday, aggravated by raising arm. Pt denies light headedness or adverse s/e from herincreaseddose of diovan. She denies fever or chills and otherwise feels well.  Allergies: 1)  ! Nsaids  Review of Systems      See HPI General:  Complains of fatigue. Eyes:  Denies blurring and discharge. ENT:  Denies hoarseness, nasal congestion, sinus pressure, and sore throat. CV:  Denies chest pain or discomfort, difficulty breathing at night, difficulty breathing while lying down, palpitations, shortness of breath with exertion, and swelling of feet. Resp:  Denies cough and sputum productive. GI:  Denies abdominal pain, constipation, diarrhea, nausea, and vomiting. GU:  Denies dysuria and urinary frequency. MS:  Denies joint pain and stiffness. Psych:  Denies anxiety and depression. Endo:  Denies cold intolerance, excessive thirst, excessive urination, and heat intolerance. Heme:  Denies abnormal bruising and bleeding. Allergy:  Denies hives or rash and itching eyes.  Physical Exam  General:  Well-developed,well-nourished,in no acute distress; alert,appropriate and cooperative throughout  examination HEENT: No facial asymmetry,  EOMI, No sinus tenderness, TM's Clear, oropharynx  pink and moist.   Chest: Clear to auscultation bilaterally.  CVS: S1, S2, No murmurs, No S3.   Abd: Soft, Nontender.  MS: Adequate ROM spine, hips, shoulders and knees. tender over left axilla, aggaravated by upper ext movement also Ext: No edema.   CNS: CN 2-12 intact, power tone and sensation normal throughout.   Skin: Intact, no visible lesions or rashes.  Psych: Good eye contact, normal affect.  Memory intact, not anxious or depressed appearing.    Impression & Recommendations:  Problem # 1:  HYPERTENSION (ICD-401.9) Assessment Improved  The following medications were removed from the medication list:    Diovan 160 Mg Tabs (Valsartan) ..... One tab by mouth qd Her updated medication list for this problem includes:    Furosemide 20 Mg Tabs (Furosemide) .Marland Kitchen... Take 1 tablet by mouth once a day as needed    Diovan Hct 160-12.5 Mg Tabs (Valsartan-hydrochlorothiazide) .Marland Kitchen... Take 1 tablet by mouth once a day  BP today: 110/80 Prior BP: 140/90 (05/24/2010)  Labs Reviewed: K+: 4.6 (05/06/2010) Creat: : 0.90 (05/06/2010)   Chol: 178 (05/06/2010)   HDL: 50 (05/06/2010)   LDL: 98 (05/06/2010)   TG: 151 (05/06/2010)  Problem # 2:  HYPERLIPIDEMIA (ICD-272.4) Assessment: Improved  Her updated medication list for this problem includes:    Lovastatin 40 Mg Tabs (Lovastatin) .Marland Kitchen... Take two tabs by mouth at bedtime for cholesterol  Labs Reviewed: SGOT: 17 (  05/06/2010)   SGPT: 19 (05/06/2010)   HDL:50 (05/06/2010), 61 (01/29/2010)  LDL:98 (05/06/2010), 112 (16/08/9603)  Chol:178 (05/06/2010), 196 (01/29/2010)  Trig:151 (05/06/2010), 117 (01/29/2010)  Problem # 3:  HYPOTHYROIDISM (ICD-244.9) Assessment: Comment Only  Her updated medication list for this problem includes:    Levothyroxine Sodium 100 Mcg Tabs (Levothyroxine sodium) ..... One tablet daly monday through friday, and half a tableton  saturday and sunday upcoming appt with endo  Problem # 4:  GENERALIZED PAIN (ICD-780.96) Assessment: Comment Only tramadol for left axillary pain  Complete Medication List: 1)  Lovastatin 40 Mg Tabs (Lovastatin) .... Take two tabs by mouth at bedtime for cholesterol 2)  Pantoprazole Sodium 40 Mg Tbec (Pantoprazole sodium) .... Take 1 tablet by mouth two times a day 3)  Furosemide 20 Mg Tabs (Furosemide) .... Take 1 tablet by mouth once a day as needed 4)  Klor-con M20 20 Meq Cr-tabs (Potassium chloride crys cr) .... Take 1 tablet by mouth once a day as needed 5)  Carafate 1 Gm/10ml Susp (Sucralfate) .... 10cc before meals and at bedtime 6)  Imipramine Hcl 10 Mg Tabs (Imipramine hcl) .... 1 by mouth at bedtime for 3 days the 2 by mouth at bedtime for 3 days then 3 by mouth qhs 7)  Levothyroxine Sodium 100 Mcg Tabs (Levothyroxine sodium) .... One tablet daly monday through friday, and half a tableton saturday and sunday 8)  Diovan Hct 160-12.5 Mg Tabs (Valsartan-hydrochlorothiazide) .... Take 1 tablet by mouth once a day 9)  Tramadol Hcl 50 Mg Tabs (Tramadol hcl) .... Take 1 tab by mouth at bedtime for 5 days then as needed for pain  Patient Instructions: 1)  Please schedule a follow-up appointment in 3. months. 2)  bP is great , no med change 3)  Tramadol one at night for left armpit pain, calll if it persits please. Prescriptions: TRAMADOL HCL 50 MG TABS (TRAMADOL HCL) Take 1 tab by mouth at bedtime for 5 days then as needed for pain  #30 x 0   Entered and Authorized by:   Margaret Simpson MD   Signed by:   Margaret Simpson MD on 06/14/2010   Method used:   Electronically to        [facility]* (retail)       [address]       [place], [place]  [address]       Ph: [phone]       Fax: [phone]   RxID:   [id number] DIOVAN HCT 160-12.5 MG TABS (VALSARTAN-HYDROCHLOROTHIAZIDE) Take 1 tablet by mouth once a day  #30 x 4   Entered and  Authorized by:   Syliva Overman MD   Signed by:   Syliva Overman MD on 06/14/2010   Method used:   Print then Give to Patient   RxID:   [id number]

## 2010-11-30 NOTE — Letter (Signed)
Summary: Letter  Letter   Imported By: Lind Guest 05/27/2010 15:55:40  _____________________________________________________________________  External Attachment:    Type:   Image     Comment:   External Document

## 2010-11-30 NOTE — Letter (Signed)
Summary: LETTER/ TAKE HOME INSTRUCTIONS FROM [facility]  LETTER/ TAKE HOME INSTRUCTIONS FROM [facility]   Imported By: Lind Guest 08/30/2010 10:03:35  _____________________________________________________________________  External Attachment:    Type:   Image     Comment:   External Document

## 2010-11-30 NOTE — Progress Notes (Signed)
Summary: carafate rx  Phone Note Call from Patient Call back at Home Phone [phone]   Caller: Patient Summary of Call: pt came by office- stated she [facility] did not have rx from 215/11 for Carafate. Rx called to [facility] per [facility] ov note from 12/15/09. Initial call taken by: Hendricks Limes LPN,  January 04, 2010 4:02 PM

## 2010-11-30 NOTE — Progress Notes (Signed)
Summary: phone note/ gas/nausea  Phone Note Call from Patient   Caller: Patient Summary of Call: Pt called and said she has been having alot of gas. She went to work this AM at 3:00 and was home at 5:00  because of the pain with gas. She was having nausea and thought she was going to have to vomit, but never did. She said she also has a hiatal hernia.  Please advise! Initial call taken by: Cloria Spring LPN,  August 03, 2010 4:25 PM     Appended Document: phone note/ gas/nausea Please call pt. She has a known history of gastritis. Her Sx are likely 2o to gastritis/reflux. Continue Protonix and Carafate. Follow up w/ [facility] 30 min slot w/ [facility] or NP.  Appended Document: phone note/ gas/nausea LMOM to call.  Appended Document: phone note/ gas/nausea LMOM to call.  Appended Document: phone note/ gas/nausea Pt was informed of the above. She said the Protonix nor the Carafate are helping. She contacted PCP and they are sending her a list of things that she should and should not eat.  Appended Document: phone note/ gas/nausea Pt needs OPV ASAP W/ [facility] OR EXTENDER. Schedule for Tobi Bastos.  Appended Document: phone note/ gas/nausea pt is aware of her appt 10/13 @ 2pm w/SF

## 2010-11-30 NOTE — Letter (Signed)
Summary: Out of Work  [facility]  [address]   [place], [place] [address]   Phone: [phone]  Fax: [phone]    August 23, 2010   Employee:  Laura [name]    To Whom It May Concern:   For Medical reasons, please excuse the above named employee from work for the following dates:  Start:   08/23/10    End:   08/30/10 to return with no restrictions  If you need additional information, please feel free to contact our office.         Sincerely,    Milus Mallick. Lodema Hong, MD

## 2010-11-30 NOTE — Assessment & Plan Note (Signed)
Summary: fmla paper   Allergies: 1)  ! Nsaids   Complete Medication List: 1)  Lovastatin 40 Mg Tabs (Lovastatin) .... Take two tabs by mouth at bedtime for cholesterol 2)  Furosemide 20 Mg Tabs (Furosemide) .... Take 1 tablet by mouth once a day as needed 3)  Klor-con M20 20 Meq Cr-tabs (Potassium chloride crys cr) .... Take 1 tablet by mouth once a day as needed 4)  Levothyroxine Sodium 100 Mcg Tabs (Levothyroxine sodium) .... One tablet daly monday through friday, and half a tableton saturday and sunday 5)  Diovan Hct 160-12.5 Mg Tabs (Valsartan-hydrochlorothiazide) .... Take 1 tablet by mouth once a day 6)  Tramadol Hcl 50 Mg Tabs (Tramadol hcl) .... Take 1 tab by mouth at bedtime for 5 days then as needed for pain 7)  Carafate 1 Gm/42ml Susp (Sucralfate) .... 2 teaspoons 4 times daily, before meals and at bedtime 8)  Imipramine Hcl 25 Mg Tabs (Imipramine hcl) .... 2 by mouth at bedtime. 9)  Zofran 4 Mg Tabs (Ondansetron hcl) .Marland Kitchen.. 1 by mouth every 4 hours as needed nausea or vomiting 10)  Lexapro 5 Mg Tabs (Escitalopram oxalate) .... Take 1 tablet by mouth once a day  Other Orders: Form Completion ([address])   Orders Added: 1)  Form Completion [60454]

## 2010-11-30 NOTE — Assessment & Plan Note (Signed)
Summary: light headed- room 1   Vital Signs:  Patient profile:   60 year old female Menstrual status:  hysterectomy Height:      64 inches Weight:      167.75 pounds O2 Sat:      100 % on Room air Pulse rate:   64 / minute Pulse (ortho):   64 / minute Resp:     16 per minute BP standing:   102 / 76  Vitals Entered By: Adella Hare LPN (July 01, 2010 8:23 AM) CC: light headed off and on Is Patient Diabetic? No Pain Assessment Patient in pain? no      Comments did not bring meds to ov   Serial Vital Signs/Assessments:  Time      Position  BP       Pulse  Resp  Temp     By           Lying LA  118/80   60                    Adella Hare LPN           Sitting   118/80   64                    Adella Hare LPN           Standing  102/76   64                    Adella Hare LPN   Referring Provider:  Lodema Hong Primary Provider:  Lodema Hong, M.D.   CC:  light headed off and on.  History of Present Illness: Pt presents today with c/o "swimmy headed."  This started after she ate a ham, egg and cheese bisquit yesterday.    Allergies (verified): 1)  ! Nsaids  Past History:  Past medical history reviewed for relevance to current acute and chronic problems.  Past Medical History: Reviewed history from 12/15/2009 and no changes required. CYSTITIS (ICD-595.9) OSTEOPENIA (ICD-733.90) SHOULDER PAIN (ICD-719.41) HYPOTHYROIDISM (ICD-244.9) URINARY INCONTINENCE (ICD-788.30) HYPERLIPIDEMIA (ICD-272.4) HYPERTENSION (ICD-401.9) TCS: 2005-Dr. Katrinka Blazing: WNLs   Complete Medication List: 1)  Lovastatin 40 Mg Tabs (Lovastatin) .... Take two tabs by mouth at bedtime for cholesterol 2)  Pantoprazole Sodium 40 Mg Tbec (Pantoprazole sodium) .... Take 1 tablet by mouth two times a day 3)  Furosemide 20 Mg Tabs (Furosemide) .... Take 1 tablet by mouth once a day as needed 4)  Klor-con M20 20 Meq Cr-tabs (Potassium chloride crys cr) .... Take 1 tablet by mouth once a day as needed 5)   Carafate 1 Gm/31ml Susp (Sucralfate) .Marland Kitchen.. 10cc before meals and at bedtime 6)  Imipramine Hcl 10 Mg Tabs (Imipramine hcl) .Marland Kitchen.. 1 by mouth at bedtime for 3 days the 2 by mouth at bedtime for 3 days then 3 by mouth qhs 7)  Levothyroxine Sodium 100 Mcg Tabs (Levothyroxine sodium) .... One tablet daly monday through friday, and half a tableton saturday and sunday 8)  Diovan Hct 160-12.5 Mg Tabs (Valsartan-hydrochlorothiazide) .... Take 1 tablet by mouth once a day 9)  Tramadol Hcl 50 Mg Tabs (Tramadol hcl) .... Take 1 tab by mouth at bedtime for 5 days then as needed for pain  Appended Document: light headed- room 1     Referring Provider:  Lodema Hong Primary Provider:  Lodema Hong, M.D.    History of Present Illness: Pt presents today with c/o "swimmy headed."  This started after  she ate a ham, egg and cheese bisquit yesterday.  Shortly after she states her IBS and abd pain flared up.  She had pain in the epigastrum.  No radiation and denies chest, shoulder or neck pain.  States though that her heart felt like it was racing, but states "like I had gas and needed to burp."  She has had some loose stools since too.  No blood or melena. Dizziness is described as spinning sensation.  She notices this with change of positions and if bends over and stands back up. She denies hearing loss, tinnitus, or ear pain.  No nasal congestion, cough or difficulty breathing.  No fever or chills.      Allergies: 1)  ! Nsaids  Review of Systems General:  Denies chills and fever. Eyes:  Denies blurring and double vision. ENT:  Denies decreased hearing, earache, nasal congestion, ringing in ears, and sore throat. CV:  Complains of palpitations; denies chest pain or discomfort. Resp:  Denies cough and shortness of breath. GI:  Complains of abdominal pain, diarrhea, indigestion, and loss of appetite; denies nausea and vomiting. Neuro:  Complains of headaches; denies numbness and tingling; DESCRIBES A HA BEHIND HER  EYES, 4 ON SCALE 1 TO 10.  Physical Exam  General:  Well-developed,well-nourished,in no acute distress; alert,appropriate and cooperative throughout examination Head:  Normocephalic and atraumatic without obvious abnormalities. No apparent alopecia or balding. Eyes:  No corneal or conjunctival inflammation noted. EOMI. Perrla. Funduscopic exam benign, without hemorrhages, exudates or papilledema. Ears:  External ear exam shows no significant lesions or deformities.  Otoscopic examination reveals clear canals, tympanic membranes are intact bilaterally without bulging, retraction, inflammation or discharge. Hearing is grossly normal bilaterally. Nose:  External nasal examination shows no deformity or inflammation. Nasal mucosa are pink and moist without lesions or exudates. Mouth:  Oral mucosa and oropharynx without lesions or exudates.  Neck:  No deformities, masses, or tenderness noted.no carotid bruits.   Lungs:  Normal respiratory effort, chest expands symmetrically. Lungs are clear to auscultation, no crackles or wheezes. Heart:  Normal rate and regular rhythm. S1 and S2 normal without gallop, murmur, click, rub or other extra sounds. Abdomen:  Bowel sounds positive,abdomen soft and non-tender except for some mild tenderness at epigastrum only without masses, organomegaly or hernias noted.no guarding.   Neurologic:  alert & oriented X3, cranial nerves II-XII intact, strength normal in all extremities, sensation intact to light touch, gait normal, and DTRs symmetrical and normal.   Cervical Nodes:  No lymphadenopathy noted Psych:  Cognition and judgment appear intact. Alert and cooperative with normal attention span and concentration. No apparent delusions, illusions, hallucinations   Impression & Recommendations:  Problem # 1:  DIZZINESS (ICD-780.4) Assessment New Reviewed with Dr Lodema Hong.  Orders: Cardiology Referral (Cardiology) EKG w/ Interpretation (93000)  Problem # 2:   BRADYCARDIA (ICD-427.89) Assessment: New  Orders: Cardiology Referral (Cardiology) EKG w/ Interpretation (93000)  Problem # 3:  GERD (ICD-530.81) Assessment: Comment Only  Her updated medication list for this problem includes:    Pantoprazole Sodium 40 Mg Tbec (Pantoprazole sodium) .Marland Kitchen... Take 1 tablet by mouth two times a day    Carafate 1 Gm/46ml Susp (Sucralfate) .Marland KitchenMarland KitchenMarland KitchenMarland Kitchen 10cc before meals and at bedtime  Problem # 4:  ABDOMINAL PAIN, EPIGASTRIC (ICD-789.06) Assessment: Deteriorated Pt advised to f/u with GI if abd syptoms persist.  Complete Medication List: 1)  Lovastatin 40 Mg Tabs (Lovastatin) .... Take two tabs by mouth at bedtime for cholesterol 2)  Pantoprazole Sodium 40  Mg Tbec (Pantoprazole sodium) .... Take 1 tablet by mouth two times a day 3)  Furosemide 20 Mg Tabs (Furosemide) .... Take 1 tablet by mouth once a day as needed 4)  Klor-con M20 20 Meq Cr-tabs (Potassium chloride crys cr) .... Take 1 tablet by mouth once a day as needed 5)  Carafate 1 Gm/45ml Susp (Sucralfate) .Marland Kitchen.. 10cc before meals and at bedtime 6)  Imipramine Hcl 10 Mg Tabs (Imipramine hcl) .Marland Kitchen.. 1 by mouth at bedtime for 3 days the 2 by mouth at bedtime for 3 days then 3 by mouth qhs 7)  Levothyroxine Sodium 100 Mcg Tabs (Levothyroxine sodium) .... One tablet daly monday through friday, and half a tableton saturday and sunday 8)  Diovan Hct 160-12.5 Mg Tabs (Valsartan-hydrochlorothiazide) .... Take 1 tablet by mouth once a day 9)  Tramadol Hcl 50 Mg Tabs (Tramadol hcl) .... Take 1 tab by mouth at bedtime for 5 days then as needed for pain  Other Orders: T-Troponin I ([id number]) Influenza Vaccine NON MCR (09811)  Patient Instructions: 1)  Please schedule a follow-up appointment in 2 weeks. 2)  I am referring you to a Cardiologist for consultation. 3)  Go to the ER if your syptoms worsen. 4)  Increase your fluid intake. 5)  Follow up with GI if your IBS syptoms continue to be flared up. 6)  Have blood  work drawn this am.   Influenza Vaccine    Vaccine Type: Fluvax Non-MCR    Site: left deltoid    Mfr: NOVARTIS    Dose: 0.5 ml    Route: IM    Given by: Adella Hare LPN    Exp. Date: 05/12    Lot #: [id number]    VIS given: 05/25/10 version given July 01, 2010.

## 2010-11-30 NOTE — Assessment & Plan Note (Signed)
Summary: ibs flare   Vital Signs:  Patient profile:   60 year old female Menstrual status:  hysterectomy Height:      64 inches Weight:      167 pounds BMI:     28.77 O2 Sat:      97 % on Room air Pulse rate:   65 / minute Pulse rhythm:   regular Resp:     16 per minute BP sitting:   130 / 72  (left arm)  Vitals Entered By: Adella Hare LPN (August 04, 2010 8:29 AM)  Nutrition Counseling: Patient's BMI is greater than 25 and therefore counseled on weight management options.  O2 Flow:  Room air CC: ibs flare, started 2 days ago with constipation and nausea Is Patient Diabetic? No Pain Assessment Patient in pain? no      Comments did not bring meds to ov   Primary Care Provider:  Dr. Syliva Overman  CC:  ibs flare and started 2 days ago with constipation and nausea.  History of Present Illness: 2 day h/o burning epigastric pain and localised left chest wall pain aggravated by pressure. Uncontrolled reflux symptoms, also no bM< x 5days. Feels lightheaded atwork she left yesterday ealy, slept well last night. She denies any recent fever or chills, head or chest congestion, dysuria or frequency. She denies uncontrolled depresssion or anxiety.  Allergies (verified): 1)  ! Nsaids  Review of Systems      See HPI General:  Complains of fatigue, loss of appetite, malaise, and weight loss; denies chills, fever, and sleep disorder. Eyes:  Denies blurring and discharge. ENT:  Denies earache, hoarseness, nasal congestion, postnasal drainage, and sinus pressure. CV:  Complains of chest pain or discomfort; denies palpitations, shortness of breath with exertion, and swelling of feet; localised chest pain , no associated nausea, diaphoresis or light headedness. Worse with direcrt pressure. Resp:  Denies cough, shortness of breath, and wheezing. GI:  Complains of abdominal pain, constipation, loss of appetite, and nausea; denies diarrhea, indigestion, and vomiting. GU:  Denies  dysuria and urinary frequency. MS:  Complains of joint pain, low back pain, mid back pain, and stiffness. Derm:  Denies itching and rash. Neuro:  Denies headaches, seizures, and sensation of room spinning. Psych:  Complains of anxiety; denies depression. Endo:  Denies cold intolerance, excessive thirst, excessive urination, and heat intolerance. Heme:  Denies abnormal bruising and bleeding. Allergy:  Complains of seasonal allergies.  Physical Exam  General:  Well-developed,well-nourished,in no acute distress; alert,appropriate and cooperative throughout examination HEENT: No facial asymmetry,  EOMI, No sinus tenderness, TM's Clear, oropharynx  pink and moist.   Chest: Clear to auscultation bilaterally. Reproducible chest wall tenderness over 3rd and 4th CC junctions on the left CVS: S1, S2, No murmurs, No S3.   Abd: localised epigastric tenderness, n o guarding or rebound. rectal: soft stool high in vault MS: Adequate ROM spine, hips, shoulders and knees.  Ext: No edema.   CNS: CN 2-12 intact, power tone and sensation normal throughout.   Skin: Intact, no visible lesions or rashes.  Psych: Good eye contact, normal affect.  Memory intact, mildly anxious but not depressed appearing.    Impression & Recommendations:  Problem # 1:  NAUSEA (ICD-787.02) Assessment Deteriorated  Orders: Zofran 1mg . injection (N8295)  Problem # 2:  CHEST WALL PAIN, ACUTE (AOZ-308.65) Assessment: Comment Only  Orders: Depo- Medrol 80mg  (J1040) Ketorolac-Toradol 15mg  (H8469) Admin of Therapeutic Inj  intramuscular or subcutaneous (62952)  Problem # 3:  HYPERLIPIDEMIA (  ICD-272.4) Assessment: Unchanged  Her updated medication list for this problem includes:    Lovastatin 40 Mg Tabs (Lovastatin) .Marland Kitchen... Take two tabs by mouth at bedtime for cholesterol Low fat diet discussed and encouraged, and literature also given  Labs Reviewed: SGOT: 17 (05/06/2010)   SGPT: 19 (05/06/2010)   HDL:50  (05/06/2010), 61 (16/08/9603)  LDL:98 (05/06/2010), 112 (54/07/8118)  Chol:178 (05/06/2010), 196 (01/29/2010)  Trig:151 (05/06/2010), 117 (01/29/2010)  Problem # 4:  HYPERTENSION (ICD-401.9) Assessment: Unchanged  Her updated medication list for this problem includes:    Furosemide 20 Mg Tabs (Furosemide) .Marland Kitchen... Take 1 tablet by mouth once a day as needed    Diovan Hct 160-12.5 Mg Tabs (Valsartan-hydrochlorothiazide) .Marland Kitchen... Take 1 tablet by mouth once a day  BP today: 130/72 Prior BP: 113/77 (07/08/2010)  Labs Reviewed: K+: 4.6 (05/06/2010) Creat: : 0.90 (05/06/2010)   Chol: 178 (05/06/2010)   HDL: 50 (05/06/2010)   LDL: 98 (05/06/2010)   TG: 151 (05/06/2010)  Complete Medication List: 1)  Lovastatin 40 Mg Tabs (Lovastatin) .... Take two tabs by mouth at bedtime for cholesterol 2)  Pantoprazole Sodium 40 Mg Tbec (Pantoprazole sodium) .... Take 1 tablet by mouth two times a day 3)  Furosemide 20 Mg Tabs (Furosemide) .... Take 1 tablet by mouth once a day as needed 4)  Klor-con M20 20 Meq Cr-tabs (Potassium chloride crys cr) .... Take 1 tablet by mouth once a day as needed 5)  Imipramine Hcl 10 Mg Tabs (Imipramine hcl) .Marland Kitchen.. 1 by mouth at bedtime for 3 days the 2 by mouth at bedtime for 3 days then 3 by mouth qhs 6)  Levothyroxine Sodium 100 Mcg Tabs (Levothyroxine sodium) .... One tablet daly monday through friday, and half a tableton saturday and sunday 7)  Diovan Hct 160-12.5 Mg Tabs (Valsartan-hydrochlorothiazide) .... Take 1 tablet by mouth once a day 8)  Tramadol Hcl 50 Mg Tabs (Tramadol hcl) .... Take 1 tab by mouth at bedtime for 5 days then as needed for pain 9)  Carafate 1 Gm/10ml Susp (Sucralfate) .... 2 teaspoons 4 times daily, before meals and at bedtime  Other Orders: T-TSH ([id number])  Patient Instructions: 1)  Please schedule a follow-up appointment in 2 months. 2)  You will get an injection for nausea, also for chest wall pain, you will get 2 injections. 3)  TSH prior  to visit, ICD-9:  today. 4)  Pls add carafate 4 times daily for your stomach, continue protonix. 5)  Work excuse to reurn 08/05/2010. 6)  Dulcolax and stool softenr today pls Prescriptions: CARAFATE 1 GM/10ML SUSP (SUCRALFATE) 2 teaspoons 4 times daily, before meals and at bedtime  #1200ml x 4   Entered and Authorized by:   Liban Guedes MD   Signed by:   Lennan Malone MD on 08/04/2010   Method used:   Electronically to        [facility]* (retail)       [address]       [place], [place]  [address]       Ph: [phone]       Fax: [phone]   RxID:   [id number]    Medication Administration  Injection # 1:    Medication: Depo- Medrol 80mg     Diagnosis: CHEST WALL PAIN, ACUTE (ICD-786.52)    Route: IM    Site: RUOQ gluteus    Exp Date: 03/2011    Lot #: [id number]    Mfr: Pharmacia  Patient tolerated injection without complications    Given by: Mauricia Area CMA  Injection # 2:    Medication: Ketorolac-Toradol 15mg     Diagnosis: CHEST WALL PAIN, ACUTE (EAV-409.81)    Route: IM    Site: RUOQ gluteus    Exp Date: 12/30/2011    Lot #: [id number]    Mfr: novaplus    Comments: 60 mg given    Patient tolerated injection without complications    Given by: Mauricia Area CMA  Injection # 3:    Medication: Zofran 1mg . injection    Diagnosis: NAUSEA (ICD-787.02)    Route: IM    Site: LUOQ gluteus    Exp Date: 01/2012    Lot #: [id number]    Mfr: novaplus    Comments: 4 mg given    Patient tolerated injection without complications    Given by: Mauricia Area  Orders Added: 1)  Est. Patient Level IV [08657] 2)  T-TSH [[id number]] 3)  Depo- Medrol 80mg  [J1040] 4)  Ketorolac-Toradol 15mg  [J1885] 5)  Zofran 1mg . injection [J2405] 6)  Admin of Therapeutic Inj  intramuscular or subcutaneous [41324]

## 2010-11-30 NOTE — Letter (Signed)
Summary: Letter  Letter   Imported By: Lind Guest 02/25/2010 14:45:00  _____________________________________________________________________  External Attachment:    Type:   Image     Comment:   External Document

## 2010-11-30 NOTE — Letter (Signed)
Summary: Plan of Care, Need to Discuss  [facility]  [address]   [place], [place] [address]   Phone: [phone]  Fax: [phone]    January 07, 2010  Laura [name] [address] [place], [place]  [address] [birth year]   Dear Ms. Reale,   We are writing this letter to inform you of treatment plans and/or discuss your plan of care.  We have tried several times to contact you; however, we have yet to reach you.  We ask that you please contact our office for follow-up on your gastrointestinal issues.  We can  be reached at [phone] to schedule an appointment, or to speak with someone regarding your health care needs.  Please do not neglect your health.   Sincerely,    Cloria Spring LPN  [facility] Ph: [phone]    Fax: [phone]

## 2010-11-30 NOTE — Progress Notes (Signed)
Summary: heartburn  Phone Note Call from Patient   Summary of Call: wants you to call her back about her heartburn Initial call taken by: Lind Guest,  August 10, 2010 1:19 PM     Appended Document: heartburn Patient states that the carafate and the protonix is not helping her heartburn. She is having it all the time and wants to know what foods to avoid ( I will send her some info regarding this) and wants to know will you change her to something different [facility]  Appended Document: heartburn I suggest you provide dexilant 60mg  one daily samples we have here let her take this instead, she needs to get an appt with gIO for re-eval also , she should call for this, thet may need to look down her stomach  Appended Document: heartburn called patient left message   Appended Document: heartburn patient aware

## 2010-12-02 NOTE — Progress Notes (Signed)
Summary: FMLA PAPERS  Phone Note Call from Patient   Summary of Call: [facility] CALLED AND THAT [employer] NEEDS FOR YOU TO PUT IN SHE HAS ACID REFLUX AND IS ON MEDICIATION AND THAT HE NEEDS TO TAKE HER TO HER APPOINMENTS BACK AND FORTH TO [place] THE REASON WHY SHE WAS SENT TO [facility] PUT THE PAPERS BACK IN YOUR BOX Initial call taken by: Lind Guest,  October 05, 2010 4:37 PM  Follow-up for Phone Call        this has been inserted on the form, pls le her know Follow-up by: Syliva Overman MD,  October 11, 2010 6:14 AM  Additional Follow-up for Phone Call Additional follow up Details #1::        left message Additional Follow-up by: Lind Guest,  October 11, 2010 9:17 AM    Additional Follow-up for Phone Call Additional follow up Details #2::    PATIENT AWARE Follow-up by: Lind Guest,  October 13, 2010 8:07 AM

## 2010-12-02 NOTE — Letter (Signed)
Summary: Letter  Letter   Imported By: Lind Guest 11/23/2010 10:07:42  _____________________________________________________________________  External Attachment:    Type:   Image     Comment:   External Document

## 2010-12-02 NOTE — Assessment & Plan Note (Signed)
Summary: FU OV IN 4 MONTHS,BLOATING,EPIGASTRIC PAIN/SS   Visit Type:  Follow-up Visit Primary Care Alexandr Oehler:  Lodema Hong, M.D.   CC:  F/U bloating /epigastric pain.  History of Present Illness: Laura [name] is a pleasant 60 year old that has a hx of chronic epigastric pain; she has been thoroughly evaluated. EGD March 2011 showed patchy erythema in antrum, biopsy showed gastritis. started on imipramine July 2011. Switched to dexilant 07/2010 but didn't tolerate, now takes protonix and doing well. Taking 50 mg imipramine at night. States she is doing "fine". Eating small portions, reflux significantly improved, denies nausea, had small amount of epigastric pain yesterday with an onion. no emesis. Reports constipation but eats blueberries every day and has a productive BM. Not on probiotic although was taking in past. Last TCS in 2005 by Dr. Katrinka Blazing, wnl. Due for repeat in 2015. U Current Medications (verified): 1)  Lovastatin 40 Mg Tabs (Lovastatin) .... Take Two Tabs By Mouth At Bedtime For Cholesterol 2)  Furosemide 20 Mg Tabs (Furosemide) .... Take 1 Tablet By Mouth Once A Day As Needed 3)  Klor-Con M20 20 Meq Cr-Tabs (Potassium Chloride Crys Cr) .... Take 1 Tablet By Mouth Once A Day As Needed 4)  Levothyroxine Sodium 100 Mcg Tabs (Levothyroxine Sodium) .... One Tablet Daly Monday Through Friday, and Half A Tableton Saturday and "Sunday 5)  Diovan Hct 160-12.5 Mg Tabs (Valsartan-Hydrochlorothiazide) .... Take 1 Tablet By Mouth Once A Day 6)  Tramadol Hcl 50 Mg Tabs (Tramadol Hcl) .... Take 1 Tab By Mouth At Bedtime For 5 Days Then As Needed For Pain 7)  Carafate 1 Gm/10ml Susp (Sucralfate) .... 2 Teaspoons 4 Times Daily, Before Meals and At Bedtime 8)  Imipramine Hcl 25 Mg Tabs (Imipramine Hcl) .... 2 By Mouth At Bedtime. 9)  Zofran 4 Mg Tabs (Ondansetron Hcl) .... 1 By Mouth Every 4 Hours As Needed Nausea or Vomiting 10)  Lexapro 5 Mg Tabs (Escitalopram Oxalate) .... Take 1 Tablet By Mouth Once A  Day  Allergies (verified): 1)  ! Nsaids  Past History:  Past Medical History: Last updated: 08/12/2010 Gastroesophageal reflux disease; history of gastritis **EGD/bx- MAR 2011  Hypertension Hyperlipidemia Anemia CYSTITIS (ICD-595.9) OSTEOPENIA (ICD-733.90) SHOULDER PAIN (ICD-719.41) HYPOTHYROIDISM (ICD-244.9); history of Hashimoto's thyroiditis URINARY INCONTINENCE (ICD-788.30) TCS: 2005-Dr. Smith: WNLs  Review of Systems General:  Denies fever, chills, and anorexia. Eyes:  Denies blurring, irritation, and discharge. ENT:  Denies sore throat, hoarseness, and difficulty swallowing. CV:  Denies chest pains and dyspnea on exertion. Resp:  Denies dyspnea at rest and wheezing. GI:  Complains of constipation; denies difficulty swallowing, pain on swallowing, nausea, and indigestion/heartburn; mild intermittent epigastric pain. chronic. GU:  Denies urinary burning, blood in urine, and urinary frequency. MS:  Denies joint pain / LOM, joint swelling, and joint stiffness. Derm:  Denies rash, itching, and dry skin. Neuro:  Denies weakness and syncope. Psych:  Denies depression and anxiety. Endo:  Denies cold intolerance and heat intolerance.  Vital Signs:  Patient profile:   59 year old female Menstrual status:  hysterectomy Height:      64 inches Weight:      166 pounds BMI:     28.60 Temp:     97" .9 degrees F oral Pulse rate:   76 / minute BP sitting:   124 / 70  (left arm) Cuff size:   regular  Vitals Entered By: Laura Spring LPN (September 13, 2010 1:58 PM)  Physical Exam  General:  Well developed, well nourished, no acute distress.  Mouth:  No deformity or lesions, dentition normal. Lungs:  Clear throughout to auscultation. Heart:  Regular rate and rhythm; no murmurs, rubs,  or bruits. Abdomen:  normal bowel sounds, without guarding, without rebound, and no hepatomegally or splenomegaly.  mild epigastric tenderness to palpation.  Extremities:  No clubbing, cyanosis,  edema or deformities noted. Neurologic:  Alert and  oriented x4;  grossly normal neurologically. Skin:  Intact without significant lesions or rashes. Psych:  Alert and cooperative. Normal mood and affect.  Impression & Recommendations:  Problem # 1:  ABDOMINAL PAIN, EPIGASTRIC (ICD-789.06) Assessment Improved  60 year old female with hx of chronic epigastric pain that has improved since starting regimen of imipramine. doing well with protonix, did not do well with dexilant. modified eating behaviors and avoiding foods that exacerbate.   Continue imipramine Continue protonix diet/behavior modification has appt with [facility] Nov 24, 2010  Orders: Est. Patient Level III (60454)  Problem # 2:  CONSTIPATION (ICD-564.00)  hx of IBS, eats blueberries in morning to have BM. No melena or hematochezia, no weight loss or change in appetite. no lower abdominal pain. not on colace, stopped probiotic. not drinking adequate fluids.  Colace 100 mg by mouth two times a day Align samples given Encourage fluid intake next TCS 2015  Orders: Est. Patient Level III (09811)

## 2010-12-02 NOTE — Progress Notes (Signed)
Summary: THYROID MEDICINE  Phone Note Call from Patient   Summary of Call: [facility] TOLD HER TO TAKE FLINSTONE VITAMINS AND ALSO HER THYROID MEDICINE  NEEDS TO ADJUSTED  CALL BACK AT [phone] TO LET HER KNOW ABOUT HER THYROID MEDICINE  Initial call taken by: Lind Guest,  November 15, 2010 4:02 PM  Follow-up for Phone Call        Patient wants to know if her thyroid med needs to be adjusted. Her labs from dec that are scanned in showed a low TSH.  Follow-up by: Everitt Amber LPN,  November 15, 2010 4:29 PM  Additional Follow-up for Phone Call Additional follow up Details #1::        pls explain that lab was1 1/30 and the 10/15 lab was nl, needs rept tSH, free T3 and free T4 now before any med adjustment, pls order the tests Additional Follow-up by: Syliva Overman MD,  November 16, 2010 5:21 AM    Additional Follow-up for Phone Call Additional follow up Details #2::    called patient left message  lab order already faxed to lab  Follow-up by: Everitt Amber LPN,  November 16, 2010 8:54 AM  Additional Follow-up for Phone Call Additional follow up Details #3:: Details for Additional Follow-up Action Taken: called patient no answer Additional Follow-up by: Everitt Amber LPN,  November 16, 2010 11:40 AM  Patient aware. Order already faxed in

## 2010-12-08 NOTE — Assessment & Plan Note (Signed)
Summary: FMLA PAPERS   Allergies: 1)  ! Nsaids   Complete Medication List: 1)  Lovastatin 40 Mg Tabs (Lovastatin) .... Take two tabs by mouth at bedtime for cholesterol 2)  Furosemide 20 Mg Tabs (Furosemide) .... Take 1 tablet by mouth once a day as needed 3)  Klor-con M20 20 Meq Cr-tabs (Potassium chloride crys cr) .... Take 1 tablet by mouth once a day as needed 4)  Levothyroxine Sodium 100 Mcg Tabs (Levothyroxine sodium) .... One tablet daly monday through friday, and half a tableton saturday and sunday 5)  Diovan Hct 160-12.5 Mg Tabs (Valsartan-hydrochlorothiazide) .... Take 1 tablet by mouth once a day 6)  Tramadol Hcl 50 Mg Tabs (Tramadol hcl) .... Take 1 tab by mouth at bedtime for 5 days then as needed for pain 7)  Carafate 1 Gm/37ml Susp (Sucralfate) .... 2 teaspoons 4 times daily, before meals and at bedtime 8)  Imipramine Hcl 25 Mg Tabs (Imipramine hcl) .... 2 by mouth at bedtime. 9)  Zofran 4 Mg Tabs (Ondansetron hcl) .Marland Kitchen.. 1 by mouth every 4 hours as needed nausea or vomiting 10)  Lexapro 5 Mg Tabs (Escitalopram oxalate) .... Take 1 tablet by mouth once a day 11)  Protonix 40 Mg Tbec (Pantoprazole sodium) .... One tab by mouth two times a day  Other Orders: Form Completion (16109)   Orders Added: 1)  Form Completion [60454]

## 2010-12-08 NOTE — Letter (Signed)
Summary: fmla papers  fmla papers   Imported By: Lind Guest 11/30/2010 13:14:15  _____________________________________________________________________  External Attachment:    Type:   Image     Comment:   External Document

## 2010-12-10 ENCOUNTER — Encounter: Payer: Self-pay | Admitting: Family Medicine

## 2010-12-16 ENCOUNTER — Encounter: Payer: Self-pay | Admitting: Family Medicine

## 2010-12-16 ENCOUNTER — Ambulatory Visit (INDEPENDENT_AMBULATORY_CARE_PROVIDER_SITE_OTHER): Payer: BC Managed Care – PPO | Admitting: Family Medicine

## 2010-12-16 DIAGNOSIS — I10 Essential (primary) hypertension: Secondary | ICD-10-CM

## 2010-12-16 DIAGNOSIS — K219 Gastro-esophageal reflux disease without esophagitis: Secondary | ICD-10-CM

## 2010-12-16 DIAGNOSIS — F411 Generalized anxiety disorder: Secondary | ICD-10-CM

## 2010-12-17 ENCOUNTER — Encounter: Payer: Self-pay | Admitting: Family Medicine

## 2010-12-20 ENCOUNTER — Encounter: Payer: Self-pay | Admitting: Family Medicine

## 2010-12-20 LAB — CONVERTED CEMR LAB
ALT: 13 units/L (ref 0–35)
AST: 19 units/L (ref 0–37)
Alkaline Phosphatase: 60 units/L (ref 39–117)
Bilirubin, Direct: <0.1 mg/dL (ref 0.0–0.3)
Calcium: 9.6 mg/dL (ref 8.4–10.5)
Cholesterol: 160 mg/dL (ref 0–200)
Creatinine, Ser: 0.85 mg/dL (ref 0.40–1.20)
Glucose, Bld: 91 mg/dL (ref 70–99)
Sodium: 139 meq/L (ref 135–145)
Total Bilirubin: 0.5 mg/dL (ref 0.3–1.2)
Total CHOL/HDL Ratio: 3.5

## 2010-12-21 LAB — CONVERTED CEMR LAB: TSH: 0.047 microintl units/mL — ABNORMAL LOW (ref 0.350–4.500)

## 2010-12-22 NOTE — Letter (Signed)
[facility]   Imported By: Lind Guest 12/17/2010 10:43:44  _____________________________________________________________________  External Attachment:    Type:   Image     Comment:   External Document

## 2010-12-22 NOTE — Letter (Signed)
Summary: fmla papers  fmla papers   Imported By: Lind Guest 12/17/2010 10:43:07  _____________________________________________________________________  External Attachment:    Type:   Image     Comment:   External Document

## 2010-12-23 ENCOUNTER — Other Ambulatory Visit (HOSPITAL_COMMUNITY): Payer: Self-pay | Admitting: "Endocrinology

## 2010-12-23 DIAGNOSIS — E049 Nontoxic goiter, unspecified: Secondary | ICD-10-CM

## 2010-12-28 ENCOUNTER — Ambulatory Visit (HOSPITAL_COMMUNITY)
Admission: RE | Admit: 2010-12-28 | Discharge: 2010-12-28 | Disposition: A | Discharge status: Home / Self Care | Payer: BC Managed Care – PPO | Source: Ambulatory Visit | Attending: Internal Medicine | Admitting: Internal Medicine

## 2010-12-28 DIAGNOSIS — E049 Nontoxic goiter, unspecified: Secondary | ICD-10-CM

## 2010-12-28 DIAGNOSIS — Z09 Encounter for follow-up examination after completed treatment for conditions other than malignant neoplasm: Secondary | ICD-10-CM | POA: Insufficient documentation

## 2010-12-28 IMAGING — US US SOFT TISSUE HEAD/NECK
1 series · 14 of 25 positions shown · non-contrast
Comparison: [DATE]

CLINICAL DATA: Goiter, by history prior left thyroidectomy

THYROID ULTRASOUND
TECHNIQUE: Ultrasound examination of the thyroid gland and adjacent
soft tissues was performed.

[Series 1: us soft tissue head/neck · 0.08mm/px · 39 acquisitions, 14 frames shown]
[im 1/39]
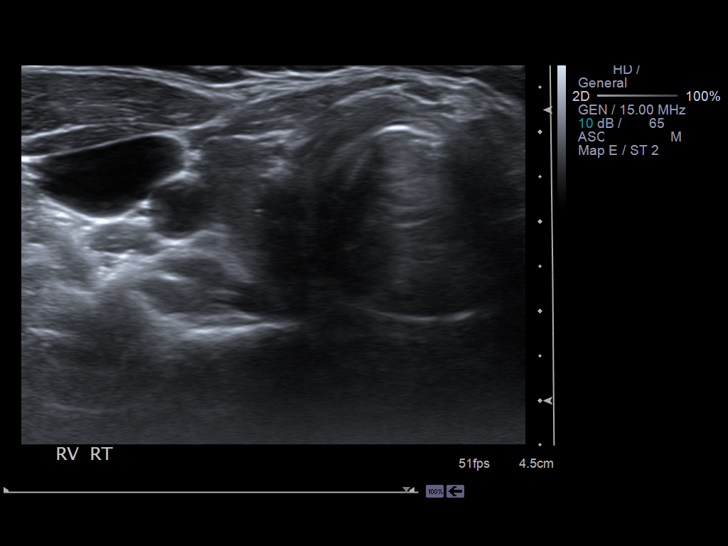
[im 4/39]
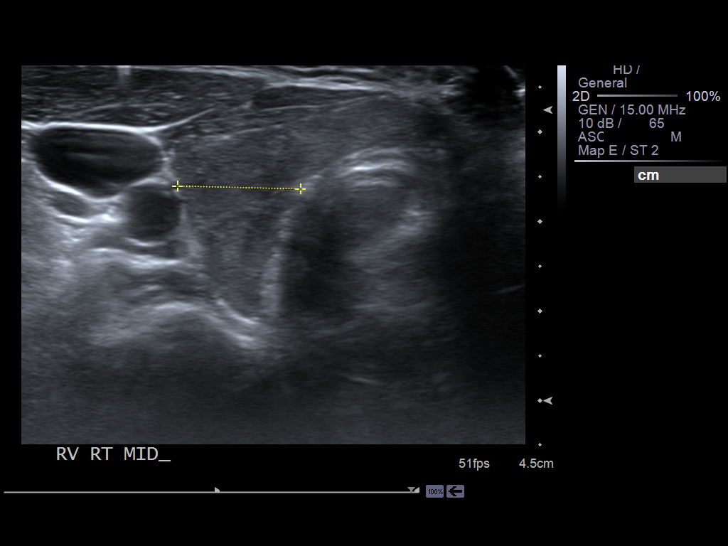
[im 7/39]
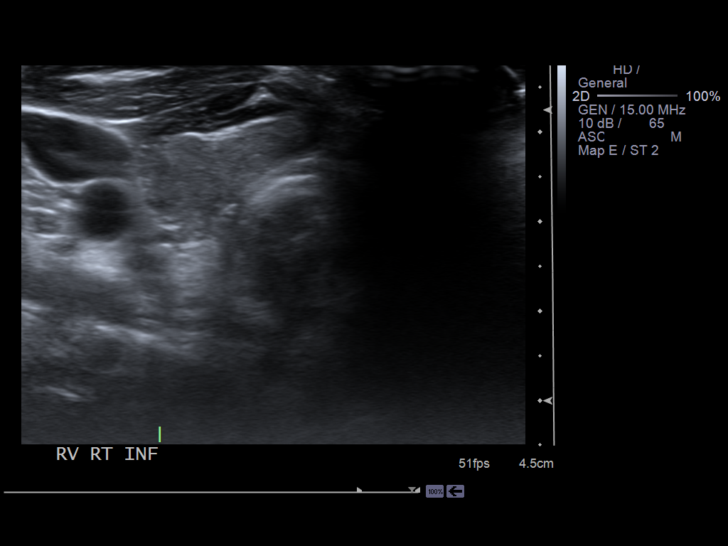
[im 10/39]
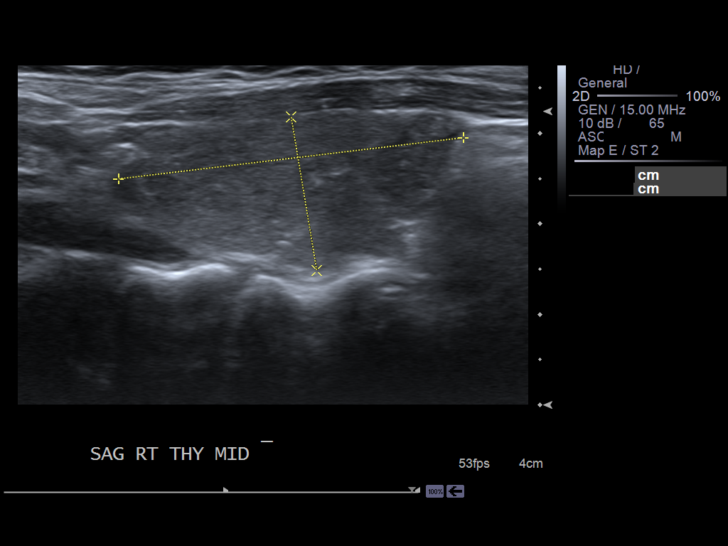
[im 13/39]
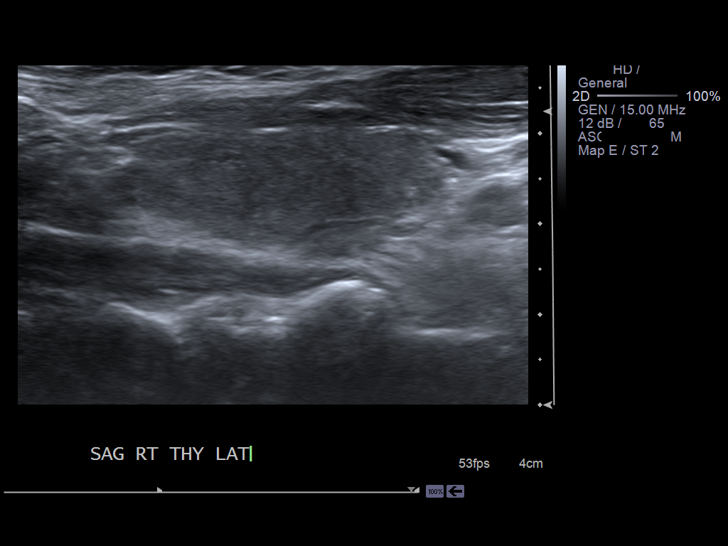
[im 15/39]
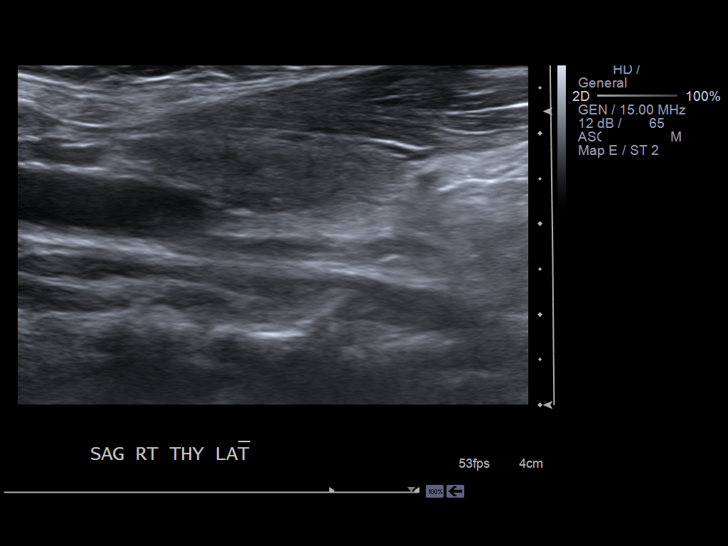
[im 18/39]
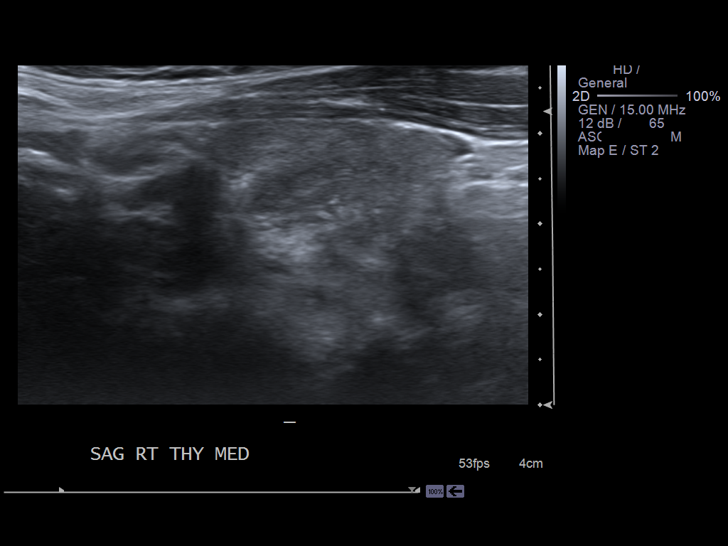
[im 21/39]
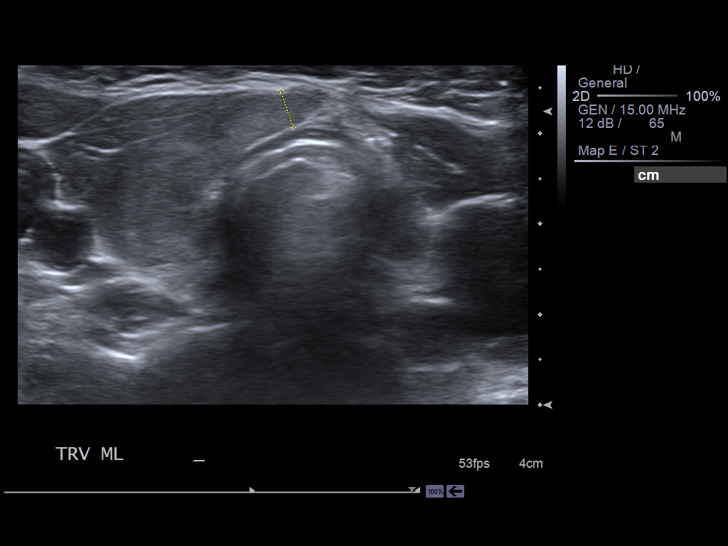
[im 24/39]
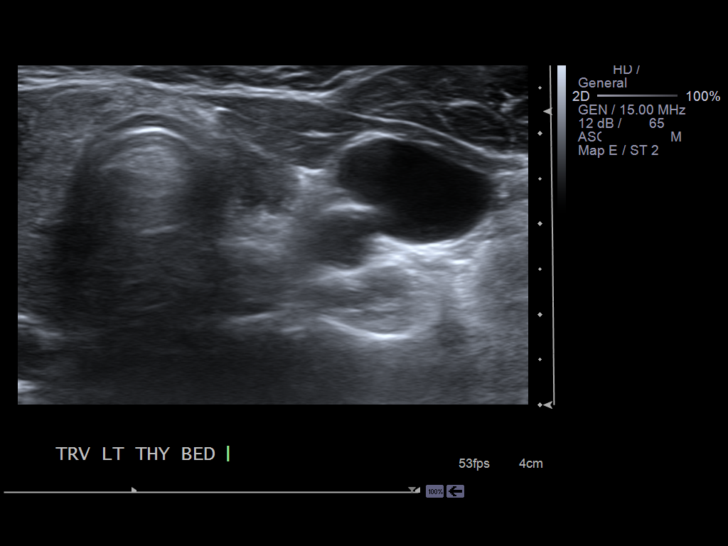
[im 26/39]
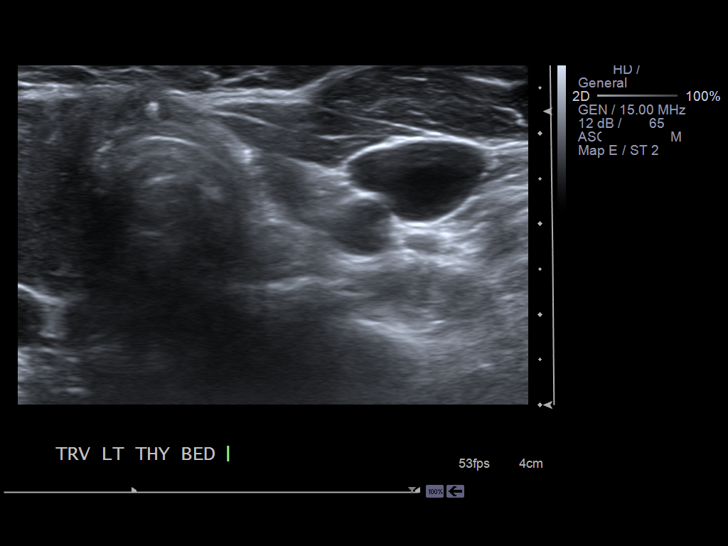
[im 29/39]
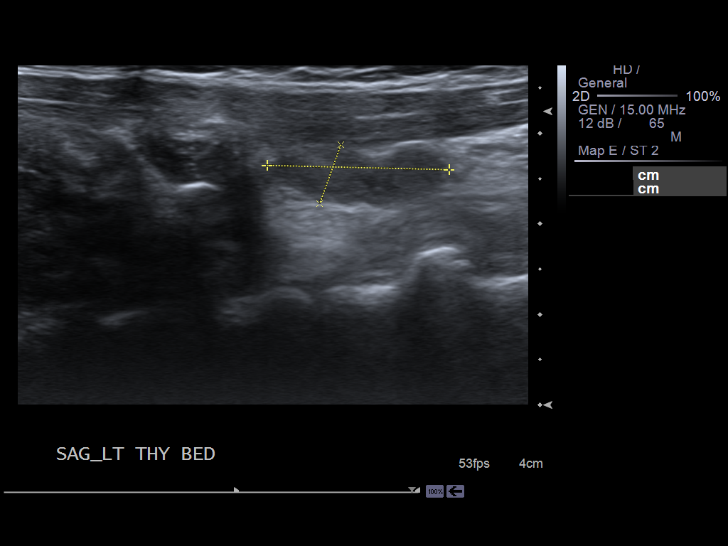
[im 32/39]
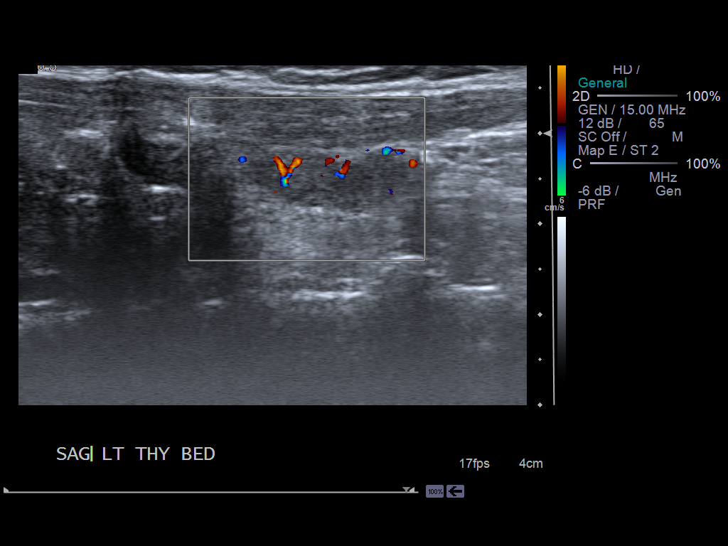
[im 35/39]
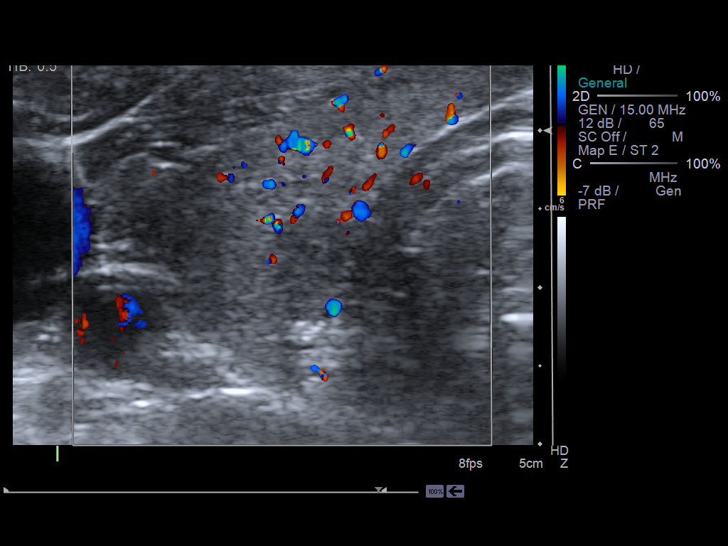
[im 39/39]
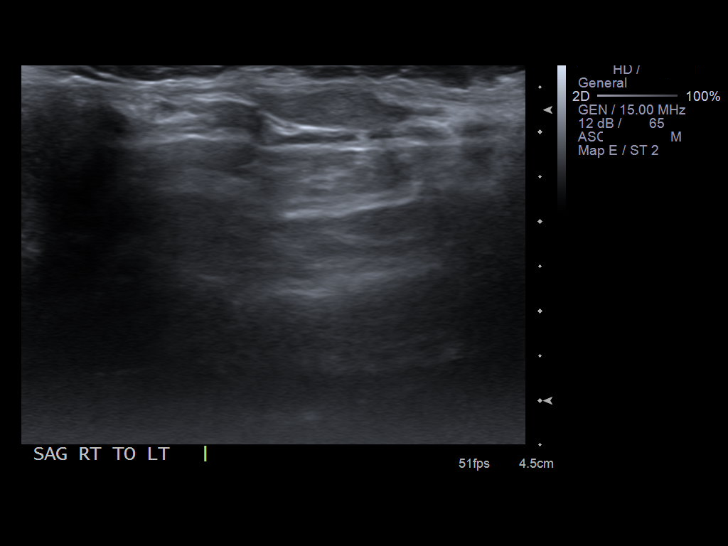

[14 of 25 positions shown; findings below may reference images not displayed]

FINDINGS: Right thyroid lobe:  3.8 x 1.7 x 1.4 cm.
Left thyroid lobe:  2.0 x 0.7 x 0.8 cm.
Isthmus:  4 mm thick

Focal nodules:  Diffusely inhomogeneous parenchymal echogenicity.
No discrete thyroid nodule identified.  Tissue is identified in the
left thyroid bed which has the appearance of left thyroid tissue,
question remnant, showing no calcification, cyst or mass.  Overall
appearance is unchanged since previous exam.  No discrete mass
identified.

Lymphadenopathy:  None identified.
IMPRESSION: Postsurgical changes of left lobe thyroidectomy with suspect small
thyroid remnant in left thyroid bed.
No discrete thyroid mass identified.

## 2010-12-28 NOTE — Letter (Signed)
Summary: Letter  Letter   Imported By: Lind Guest 12/20/2010 14:52:03  _____________________________________________________________________  External Attachment:    Type:   Image     Comment:   External Document

## 2010-12-28 NOTE — Letter (Signed)
Summary: lab add on   lab add on   Imported By: Luann Bullins 12/20/2010 14:30:15  _____________________________________________________________________  External Attachment:    Type:   Image     Comment:   External Document

## 2010-12-28 NOTE — Assessment & Plan Note (Signed)
Summary: follow up   Vital Signs:  Patient profile:   60 year old female Menstrual status:  hysterectomy Height:      64 inches Weight:      165.25 pounds BMI:     28.47 O2 Sat:      98 % Pulse rate:   61 / minute Pulse rhythm:   regular Resp:     16 per minute BP sitting:   112 / 74  (left arm) Cuff size:   regular  Vitals Entered By: Everitt Amber LPN (December 16, 2010 4:19 PM)  Nutrition Counseling: Patient's BMI is greater than 25 and therefore counseled on weight management options. CC: Follow up chronic problems, still having reflux and stomach pain   Primary Care Provider:  Lodema Hong, M.D.   CC:  Follow up chronic problems and still having reflux and stomach pain.  History of Present Illness: Pt in today stating that hwer work needs info on her being seen at [facility] which is already on her form. she is on dicyclomine 10mg  one daily at bedtime, states it makes her drowsy so not taking 3 per day as prescribed.  she still at times experiences disabling stomach spasm, I have encouraged her to see if she can inc to twice daily dicyclmine. Reports  that overall she feels better, and is generally doing fairly well. Denies recent fever or chills. Denies sinus pressure, nasal congestion , ear pain or sore throat. Denies chest congestion, or cough productive of sputum. Denies chest pain, palpitations, PND, orthopnea or leg swelling. Denies dysuria , frequency,  or hesitancy. Denies  joint pain, swelling, or reduced mobility. Denies headaches, vertigo, seizures. Denies depression, anxiety or insomnia.Good response to medication. Denies  rash, lesions, or itch.      .  Current Medications (verified): 1)  Lovastatin 40 Mg Tabs (Lovastatin) .... Take Two Tabs By Mouth At Bedtime For Cholesterol 2)  Furosemide 20 Mg Tabs (Furosemide) .... Take 1 Tablet By Mouth Once A Day As Needed 3)  Klor-Con M20 20 Meq Cr-Tabs (Potassium Chloride Crys Cr) .... Take 1 Tablet By Mouth Once A  Day As Needed 4)  Levothyroxine Sodium 100 Mcg Tabs (Levothyroxine Sodium) .... One Tablet Daly Monday Through Friday, and Half A Tableton Saturday and Sunday 5)  Diovan Hct 160-12.5 Mg Tabs (Valsartan-Hydrochlorothiazide) .... Take 1 Tablet By Mouth Once A Day 6)  Tramadol Hcl 50 Mg Tabs (Tramadol Hcl) .... Take 1 Tab By Mouth At Bedtime For 5 Days Then As Needed For Pain 7)  Lexapro 5 Mg Tabs (Escitalopram Oxalate) .... Take 1 Tablet By Mouth Once A Day 8)  Protonix 40 Mg Tbec (Pantoprazole Sodium) .... One Tab By Mouth Two Times A Day 9)  Dicyclomine Hcl 10 Mg Caps (Dicyclomine Hcl) .... One Cap Every 6 Hrs As Needed 10)  Multivitamins  Tabs (Multiple Vitamin) .... Take 1 Tablet By Mouth Once A Day 11)  Flintstones Plus Iron  Chew (Pediatric Multivitamins-Iron) .... Take 1 Tablet By Mouth Once A Day  Allergies (verified): 1)  ! Nsaids  Review of Systems      See HPI Eyes:  Complains of vision loss-both eyes. GI:  Complains of abdominal pain, loss of appetite, and nausea; denies bloody stools, constipation, diarrhea, indigestion, and vomiting. Psych:  Complains of anxiety and depression; denies mental problems, panic attacks, suicidal thoughts/plans, thoughts of violence, and unusual visions or sounds; improved on medication. Endo:  Denies cold intolerance, excessive hunger, and excessive thirst. Heme:  Denies abnormal bruising.  Allergy:  Complains of seasonal allergies.  Physical Exam  General:  Well-developed,well-nourished,in no acute distress; alert,appropriate and cooperative throughout examination HEENT: No facial asymmetry,  EOMI, No sinus tenderness, TM's Clear, oropharynx  pink and moist.   Chest: Clear to auscultation bilaterally.  CVS: S1, S2, No murmurs, No S3.   Abd: Soft, Nontender.  MS: Adequate ROM spine, hips, shoulders and knees.  Ext: No edema.   CNS: CN 2-12 intact, power tone and sensation normal throughout.   Skin: Intact, no visible lesions or rashes.    Psych: Good eye contact, normal affect.  Memory intact, not anxious or depressed appearing.    Impression & Recommendations:  Problem # 1:  ANXIETY STATE, UNSPECIFIED (ICD-300.00) Assessment Improved  The following medications were removed from the medication list:    Imipramine Hcl 25 Mg Tabs (Imipramine hcl) .Marland Kitchen... 2 by mouth at bedtime. Her updated medication list for this problem includes:    Lexapro 5 Mg Tabs (Escitalopram oxalate) .Marland Kitchen... Take 1 tablet by mouth once a day  Problem # 2:  HYPERTENSION (ICD-401.9) Assessment: Improved  Her updated medication list for this problem includes:    Furosemide 20 Mg Tabs (Furosemide) .Marland Kitchen... Take 1 tablet by mouth once a day as needed    Diovan Hct 160-12.5 Mg Tabs (Valsartan-hydrochlorothiazide) .Marland Kitchen... Take 1 tablet by mouth once a day  Orders: T-Basic Metabolic Panel [id number])  BP today: 112/74 Prior BP: 130/80 (09/15/2010)  Labs Reviewed: K+: 4.6 (05/06/2010) Creat: : 0.90 (05/06/2010)   Chol: 178 (05/06/2010)   HDL: 50 (05/06/2010)   LDL: 98 (05/06/2010)   TG: 151 (05/06/2010)  Problem # 3:  HYPERLIPIDEMIA (ICD-272.4) Assessment: Comment Only  Her updated medication list for this problem includes:    Lovastatin 40 Mg Tabs (Lovastatin) .Marland Kitchen... Take two tabs by mouth at bedtime for cholesterol Low fat dietdiscussed and encouraged  Orders: T-Hepatic Function [id number]) T-Lipid Profile [phone])  Labs Reviewed: SGOT: 17 (05/06/2010)   SGPT: 19 (05/06/2010)   HDL:50 (05/06/2010), 61 (01/29/2010)  LDL:98 (05/06/2010), 112 (57/84/6962)  Chol:178 (05/06/2010), 196 (01/29/2010)  Trig:151 (05/06/2010), 117 (01/29/2010)  Problem # 4:  GERD (ICD-530.81) Assessment: Improved  The following medications were removed from the medication list:    Carafate 1 Gm/107ml Susp (Sucralfate) .Marland Kitchen... 2 teaspoons 4 times daily, before meals and at bedtime Her updated medication list for this problem includes:    Protonix 40 Mg Tbec  (Pantoprazole sodium) ..... One tab by mouth two times a day    Dicyclomine Hcl 10 Mg Caps (Dicyclomine hcl) ..... One cap every 6 hrs as needed  Problem # 5:  POSTSURGICAL HYPOTHYROIDISM (ICD-244.0) Assessment: Comment Only  Her updated medication list for this problem includes:    Levothyroxine Sodium 100 Mcg Tabs (Levothyroxine sodium) ..... One tablet daly monday through friday, and half a tableton saturday and sunday  Labs Reviewed: TSH: 0.032 (11/17/2010)    Chol: 178 (05/06/2010)   HDL: 50 (05/06/2010)   LDL: 98 (05/06/2010)   TG: 151 (05/06/2010)  Orders: Endocrinology Referral (Endocrine)  Complete Medication List: 1)  Lovastatin 40 Mg Tabs (Lovastatin) .... Take two tabs by mouth at bedtime for cholesterol 2)  Furosemide 20 Mg Tabs (Furosemide) .... Take 1 tablet by mouth once a day as needed 3)  Klor-con M20 20 Meq Cr-tabs (Potassium chloride crys cr) .... Take 1 tablet by mouth once a day as needed 4)  Levothyroxine Sodium 100 Mcg Tabs (Levothyroxine sodium) .... One tablet daly monday through friday, and half a tableton saturday and  sunday 5)  Diovan Hct 160-12.5 Mg Tabs (Valsartan-hydrochlorothiazide) .... Take 1 tablet by mouth once a day 6)  Tramadol Hcl 50 Mg Tabs (Tramadol hcl) .... Take 1 tab by mouth at bedtime for 5 days then as needed for pain 7)  Lexapro 5 Mg Tabs (Escitalopram oxalate) .... Take 1 tablet by mouth once a day 8)  Protonix 40 Mg Tbec (Pantoprazole sodium) .... One tab by mouth two times a day 9)  Dicyclomine Hcl 10 Mg Caps (Dicyclomine hcl) .... One cap every 6 hrs as needed 10)  Multivitamins Tabs (Multiple vitamin) .... Take 1 tablet by mouth once a day 11)  Flintstones Plus Iron Chew (Pediatric multivitamins-iron) .... Take 1 tablet by mouth once a day  Patient Instructions: 1)  Please schedule a CPE in 3 months. 2)  It is important that you exercise regularly at least 30 minutes 5 times a week. If you develop chest pain, have severe difficulty  breathing, or feel very tired , stop exercising immediately and seek medical attention. 3)  You need to lose weight. Consider a lower calorie diet and regular exercise.  4)  BMP prior to visit, ICD-9: 5)  Hepatic Panel prior to visit, ICD-9:  fasting asap 6)  Lipid Panel prior to visit, ICD-9: 7)  You are being referred to dr Nida Prescriptions: PROTONIX 40 MG TBEC (PANTOPRAZOLE SODIUM) one tab by mouth two times a day  #60 x 3   Entered by:   Jaime Boothe LPN   Authorized by:   Aimy Sweeting MD   Signed by:   Jaime Boothe LPN on 12/16/2010   Method used:   Electronically to        [facility]* (retail)       [address]       [place]       [place], [place]  [address]       Ph: [phone]       Fax: [phone]   RxID:   [id number] LEXAPRO 5 MG TABS (ESCITALOPRAM OXALATE) Take 1 tablet by mouth once a day  #30 x 3   Entered by:   Jaime Boothe LPN   Authorized by:   Shany Marinez MD   Signed by:   Jaime Boothe LPN on 12/16/2010   Method used:   Electronically to         [facility] (retail)       [address]       [place]       [place], [place]  [address]       Ph: [phone]       Fax: [phone]   RxID:   [id number] LEVOTHYROXINE SODIUM 100 MCG TABS (LEVOTHYROXINE SODIUM) one tablet daly monday through friday, and half a tableton Saturday and Sunday  #90 x 0   Entered by:   Jaime Boothe LPN   Authorized by:   Ferris Tally MD   Signed by:   Jaime Boothe LPN on 12/16/2010   Method used:   Electronically to         [facility] (retail)       [address]       [place], [place]  [address]       Ph: [phone]       Fax: [phone]   RxID:   [id number] LOVASTATIN 40 MG TABS (LOVASTATIN) Take two tabs by mouth at bedtime for cholesterol  #60 Each  x 3   Entered by:   Adella Hare LPN   Authorized by:   Syliva Overman MD   Signed by:   Adella Hare LPN on  78/29/5621   Method used:   Electronically to        [facility]* (retail)       [address]       [place], [place]  [address]       Ph: [phone]       Fax: [phone]   RxID:   [id number]    Orders Added: 1)  Est. Patient Level IV [95638] 2)  T-Basic Metabolic Panel [75643-32951] 3)  T-Hepatic Function [80076-22960] 4)  T-Lipid Profile [88416-60630] 5)  Endocrinology Referral [Endocrine] 6)  Endocrinology Referral [Endocrine]

## 2011-01-12 LAB — BASIC METABOLIC PANEL
BUN: 12 mg/dL (ref 6–23)
CO2: 29 mEq/L (ref 19–32)
Calcium: 9 mg/dL (ref 8.4–10.5)
GFR calc non Af Amer: >60 mL/min (ref >60)
Glucose, Bld: 106 mg/dL — ABNORMAL HIGH (ref 70–99)
Potassium: 3.1 mEq/L — ABNORMAL LOW (ref 3.5–5.1)

## 2011-01-12 LAB — CBC
HCT: 31.2 % — ABNORMAL LOW (ref 36.0–46.0)
MCH: 28.3 pg (ref 26.0–34.0)
MCHC: 33.4 g/dL (ref 30.0–36.0)
RDW: 12.2 % (ref 11.5–15.5)

## 2011-01-12 LAB — URINALYSIS, ROUTINE W REFLEX MICROSCOPIC
Bilirubin Urine: NEGATIVE
Hgb urine dipstick: NEGATIVE
Ketones, ur: NEGATIVE mg/dL
Nitrite: NEGATIVE
Protein, ur: NEGATIVE mg/dL
Specific Gravity, Urine: >1.03 — ABNORMAL HIGH (ref 1.005–1.030)
Urobilinogen, UA: 1 mg/dL (ref 0.0–1.0)

## 2011-01-12 LAB — DIFFERENTIAL
Basophils Absolute: 0 10*3/uL (ref 0.0–0.1)
Basophils Relative: 0 % (ref 0–1)
Eosinophils Absolute: 0.2 10*3/uL (ref 0.0–0.7)
Monocytes Absolute: 0.3 10*3/uL (ref 0.1–1.0)
Neutro Abs: 2.6 10*3/uL (ref 1.7–7.7)

## 2011-01-12 LAB — POCT CARDIAC MARKERS: Myoglobin, poc: 71.8 ng/mL (ref 12–200)

## 2011-01-17 ENCOUNTER — Encounter: Payer: Self-pay | Admitting: Gastroenterology

## 2011-01-21 LAB — COMPREHENSIVE METABOLIC PANEL
ALT: 17 U/L (ref 0–35)
AST: 17 U/L (ref 0–37)
Alkaline Phosphatase: 56 U/L (ref 39–117)
CO2: 28 mEq/L (ref 19–32)
Chloride: 106 mEq/L (ref 96–112)
GFR calc Af Amer: >60 mL/min (ref >60)
GFR calc non Af Amer: >60 mL/min (ref >60)
Glucose, Bld: 96 mg/dL (ref 70–99)
Potassium: 3.6 mEq/L (ref 3.5–5.1)
Sodium: 139 mEq/L (ref 135–145)
Total Bilirubin: 0.5 mg/dL (ref 0.3–1.2)

## 2011-01-21 LAB — LIPASE, BLOOD: Lipase: 22 U/L (ref 11–59)

## 2011-01-25 ENCOUNTER — Telehealth: Payer: Self-pay | Admitting: Family Medicine

## 2011-01-25 NOTE — Telephone Encounter (Signed)
Advise when new doc comes openings will be able and we will be happy to see her (verify insurance pls)

## 2011-01-27 NOTE — Medication Information (Signed)
Summary: PA for dexilant- denial  PA for dexilant- denial   Imported By: Hendricks Limes LPN 16/08/9603 54:09:81  _____________________________________________________________________  External Attachment:    Type:   Image     Comment:   External Document

## 2011-01-27 NOTE — Telephone Encounter (Signed)
Patient is aware 

## 2011-02-06 LAB — BASIC METABOLIC PANEL
BUN: 20 mg/dL (ref 6–23)
Creatinine, Ser: 1.09 mg/dL (ref 0.4–1.2)
GFR calc non Af Amer: 52 mL/min — ABNORMAL LOW (ref >60)
Glucose, Bld: 92 mg/dL (ref 70–99)
Potassium: 3.7 mEq/L (ref 3.5–5.1)

## 2011-02-06 LAB — CBC
HCT: 28.7 % — ABNORMAL LOW (ref 36.0–46.0)
Platelets: 238 10*3/uL (ref 150–400)
RDW: 12.3 % (ref 11.5–15.5)
WBC: 4 10*3/uL (ref 4.0–10.5)

## 2011-02-25 ENCOUNTER — Emergency Department (HOSPITAL_COMMUNITY): Payer: BC Managed Care – PPO

## 2011-02-25 ENCOUNTER — Emergency Department (HOSPITAL_COMMUNITY)
Admission: EM | Admit: 2011-02-25 | Discharge: 2011-02-25 | Disposition: A | Discharge status: Home / Self Care | Payer: BC Managed Care – PPO | Attending: Emergency Medicine | Admitting: Emergency Medicine

## 2011-02-25 DIAGNOSIS — E039 Hypothyroidism, unspecified: Secondary | ICD-10-CM | POA: Insufficient documentation

## 2011-02-25 DIAGNOSIS — K219 Gastro-esophageal reflux disease without esophagitis: Secondary | ICD-10-CM | POA: Insufficient documentation

## 2011-02-25 DIAGNOSIS — I10 Essential (primary) hypertension: Secondary | ICD-10-CM | POA: Insufficient documentation

## 2011-02-25 DIAGNOSIS — K589 Irritable bowel syndrome without diarrhea: Secondary | ICD-10-CM | POA: Insufficient documentation

## 2011-02-25 DIAGNOSIS — E7800 Pure hypercholesterolemia, unspecified: Secondary | ICD-10-CM | POA: Insufficient documentation

## 2011-02-25 DIAGNOSIS — R079 Chest pain, unspecified: Secondary | ICD-10-CM | POA: Insufficient documentation

## 2011-02-25 DIAGNOSIS — Z79899 Other long term (current) drug therapy: Secondary | ICD-10-CM | POA: Insufficient documentation

## 2011-02-25 IMAGING — CR DG CHEST 2V
2 series · 2 of 2 positions shown · non-contrast
Comparison: None.

CLINICAL DATA: Chest pain.

CHEST - 2 VIEW

[view not recorded (1 of 2)]
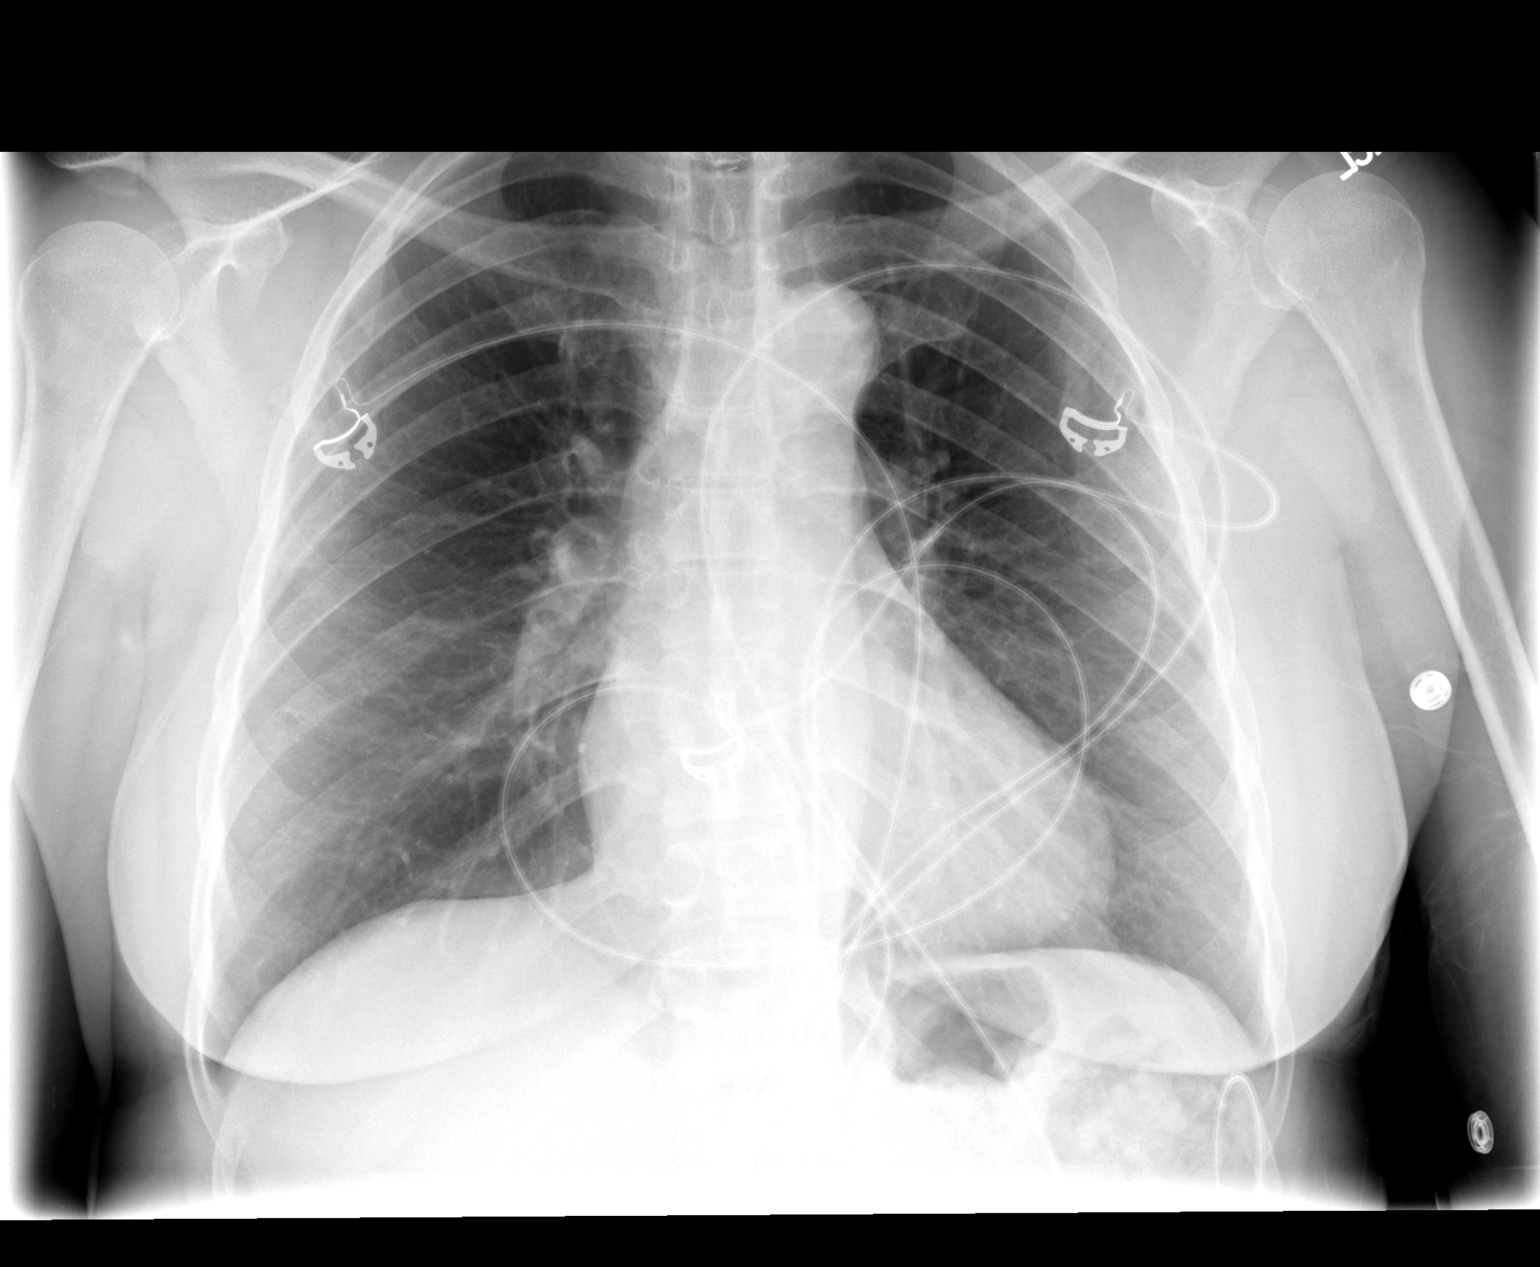

[view not recorded (2 of 2)]
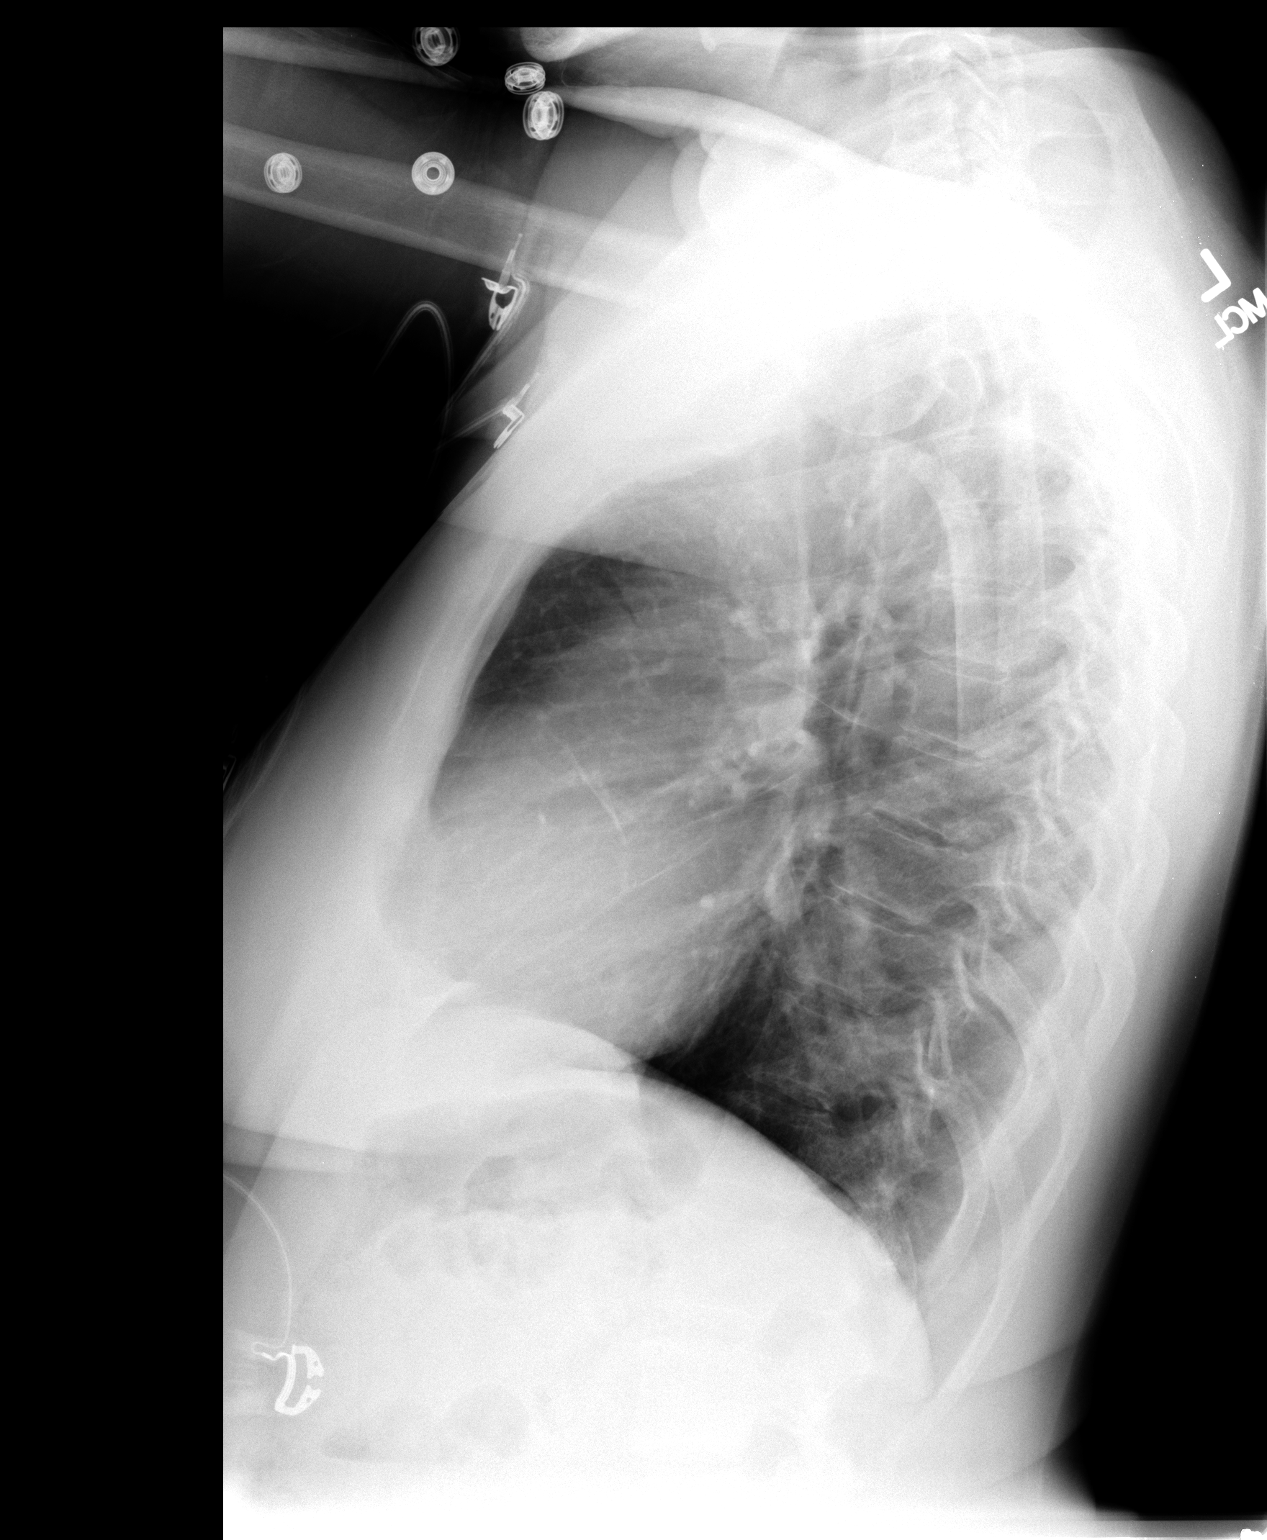

[2 of 2 positions shown; findings below may reference images not displayed]

FINDINGS: The heart size and mediastinal contours are within
normal limits.  Both lungs are clear.  The visualized skeletal
structures are unremarkable.
IMPRESSION: No active cardiopulmonary disease.

## 2011-02-28 ENCOUNTER — Telehealth: Payer: Self-pay | Admitting: Family Medicine

## 2011-03-01 ENCOUNTER — Ambulatory Visit (INDEPENDENT_AMBULATORY_CARE_PROVIDER_SITE_OTHER): Payer: BC Managed Care – PPO | Admitting: Family Medicine

## 2011-03-01 ENCOUNTER — Encounter: Payer: Self-pay | Admitting: Family Medicine

## 2011-03-01 VITALS — BP 138/82 | HR 59 | Resp 16 | Wt 162.1 lb

## 2011-03-01 DIAGNOSIS — M940 Chondrocostal junction syndrome [Tietze]: Secondary | ICD-10-CM

## 2011-03-01 DIAGNOSIS — F411 Generalized anxiety disorder: Secondary | ICD-10-CM

## 2011-03-01 DIAGNOSIS — R071 Chest pain on breathing: Secondary | ICD-10-CM

## 2011-03-01 DIAGNOSIS — R42 Dizziness and giddiness: Secondary | ICD-10-CM

## 2011-03-01 DIAGNOSIS — I10 Essential (primary) hypertension: Secondary | ICD-10-CM

## 2011-03-01 DIAGNOSIS — E785 Hyperlipidemia, unspecified: Secondary | ICD-10-CM

## 2011-03-01 MED ORDER — VALSARTAN-HYDROCHLOROTHIAZIDE 160-12.5 MG PO TABS: 1.0000 | ORAL_TABLET | Freq: Every day | ORAL | Status: DC

## 2011-03-01 MED ORDER — LOVASTATIN 40 MG PO TABS: ORAL_TABLET | ORAL | Status: DC

## 2011-03-01 MED ORDER — PREDNISONE (PAK) 5 MG PO TABS: 5.0000 mg | ORAL_TABLET | ORAL | Status: DC

## 2011-03-01 NOTE — Assessment & Plan Note (Signed)
Disabling at present, prednisone dose pack only, pt advised to take protonix for GI protection, work excuse to 03/07/2011

## 2011-03-01 NOTE — Progress Notes (Signed)
  Subjective:    Patient ID: Laura [name], female    DOB: [birth year], 60 y.o.   MRN: [id number]  HPI Pt seen in the ED on 4/27, for chest pain, reports no cardiac etiology found, received oxycodone/tylenol 5/325 two tablets  And  dilaudid 2mg  IM, had excessive nausea and vomiting for approx 24 hrs after,including in the ED, she comes in today stating confused, lightheaded,dizzy, breaking out in sweat, unable to move around , gets light headed , ever since this, but does report her symptoms are lessening. Reports she still has reproducible let  chest pain, and has been told not to take NSAID by GI Reports great news on reflux, colon the culprit and responding to dicyclomine. Pt should have returned to work yesterday per ED doc, however states she is unable to work in her current state   Review of Systems Denies recent fever or chills. Denies sinus pressure, nasal congestion, ear pain or sore throat. Denies chest congestion, productive cough or wheezing. Denies palpitations, paroxysmal nocturnal dyspnea, orthopnea and leg swelling Denies abdominal pain,diarrhea or constipation.  Denies rectal bleeding or change in bowel movement. Denies dysuria, frequency, hesitancy or incontinence. Denies joint pain, swelling and limitation in mobility. Denies depression, anxiety or insomnia. Denies skin break down or rash.        Objective:   Physical Exam Patient alert and oriented and in no Cardiopulmonary distress.  HEENT: No facial asymmetry, EOMI, no sinus tenderness, TM's clear, Oropharynx pink and moist.  Neck supple no adenopathy.  Chest: Clear to auscultation bilaterally.Tender to palpation over 2nd and 3rd CC junctions  CVS: S1, S2 no murmurs, no S3.  ABD: Soft non tender. Bowel sounds normal.  Ext: No edema  MS: Adequate ROM spine, shoulders, hips and knees.  Skin: Intact, no ulcerations or rash noted.  Psych: Good eye contact, normal affect. Memory intact  anxious   CNS: CN  2-12 intact, power, tone and sensation normal throughout.        Assessment & Plan:

## 2011-03-01 NOTE — Patient Instructions (Addendum)
You are being treated for costochondritis. You are experiencing side effects from the pain medication you received last week. You should be back to normal by 03/07/2011. Work excuse from 04/30 to return 03/07/2011

## 2011-03-01 NOTE — Assessment & Plan Note (Signed)
New, prednisone dose pack prescribed

## 2011-03-01 NOTE — Assessment & Plan Note (Signed)
Deteriorated, related to combination of analgesics recently administeerd in the Ed. Pt was given a script for narcotic , which she did not fill, reports being too symptomatic to work at this time, though slowly improving

## 2011-03-01 NOTE — Assessment & Plan Note (Signed)
Controlled, no change in medication  

## 2011-03-01 NOTE — Telephone Encounter (Signed)
Patient has OV for this AM

## 2011-03-01 NOTE — Assessment & Plan Note (Signed)
Deteriorated, due t recent adverse reaction to analgesics

## 2011-03-07 ENCOUNTER — Telehealth: Payer: Self-pay | Admitting: Family Medicine

## 2011-03-07 DIAGNOSIS — M940 Chondrocostal junction syndrome [Tietze]: Secondary | ICD-10-CM

## 2011-03-07 MED ORDER — PREDNISONE (PAK) 5 MG PO TABS: 5.0000 mg | ORAL_TABLET | ORAL | Status: DC

## 2011-03-07 NOTE — Telephone Encounter (Signed)
Patient aware.

## 2011-03-07 NOTE — Telephone Encounter (Signed)
Advise and erx prednisone 5mg  dose pack x 6 days only pls

## 2011-03-10 DIAGNOSIS — Z0279 Encounter for issue of other medical certificate: Secondary | ICD-10-CM

## 2011-03-15 NOTE — Op Note (Signed)
Laura [name], Laura [name]                 ACCOUNT NO.:  [id number]   MEDICAL RECORD NO.:  [id number]          PATIENT TYPE:  AMB   LOCATION:  DAY                           FACILITY:  [facility]   PHYSICIAN:  Ky Barban, M.D.DATE OF BIRTH:  [birth year]   DATE OF PROCEDURE:  07/01/2008  DATE OF DISCHARGE:                               OPERATIVE REPORT   PREOPERATIVE DIAGNOSIS:  Recurrent urinary tract infections.   POSTOPERATIVE DIAGNOSES:  1. Bladder neck polyps.  2. Urethral stenosis.   PROCEDURE:  Cystodilation, biopsy, and fulguration of bladder neck  polyps.   SURGEON:  Ky Barban, MD   ANESTHESIA:  General.   The patient underwent general endotracheal anesthesia in lithotomy  position.  After usual prep and drape, #25 cystoscope was introduced  into the bladder.  Bladder looked normal.  Ureteral orifices were normal  in shape and location.  The bladder neck was inspected.  There were  polyps on the bladder neck.  One of them was biopsied with the help of a  flexible biopsy forceps.  Then, using the [facility] electrode, the  bladder neck was fulgurated.  These polyps were fulgurated.  Urethra was  calibrated.  A 25-French cystoscope was then removed.  Urethra was  dilated with 32-French.  The patient left the operating room in  satisfactory condition.      Ky Barban, M.D.  Electronically Signed     MIJ/MEDQ  D:  07/01/2008  T:  07/02/2008  Job:  [id number]

## 2011-03-15 NOTE — H&P (Signed)
NAMESHRITHA, BRESEE                 ACCOUNT NO.:  [id number]   MEDICAL RECORD NO.:  [id number]          PATIENT TYPE:  AMB   LOCATION:  DAY                           FACILITY:  [facility]   PHYSICIAN:  Dalia Heading, M.D.  DATE OF BIRTH:  [birth year]   DATE OF ADMISSION:  DATE OF DISCHARGE:  LH                              HISTORY & PHYSICAL   CHIEF COMPLAINT:  Left breast neoplasm.   HISTORY OF PRESENT ILLNESS:  The patient is a 60 year old black female  who is referred for evaluation and treatment of left breast nodule.  It  has been present for some time now.  It is tender to touch.  She last  had a mammogram and ultrasound for this in December 2009, which was  negative.  There is no family history of breast carcinoma or nipple  discharge.   PAST MEDICAL HISTORY:  Includes hypertension and hypothyroidism.   PAST SURGICAL HISTORY:  Hysterectomy, tubal ligation, right knee  surgery, and right hand surgery.   CURRENT MEDICATIONS:  Synthroid 50 mcg p.o. daily, hydrochlorothiazide  25 mg p.o. daily, Atacand 32 mg p.o. daily, and lovastatin 40 mg p.o.  daily   ALLERGIES:  No known drug allergies.   REVIEW OF SYSTEMS:  Noncontributory.   PHYSICAL EXAMINATION:  GENERAL:  The patient is a well-developed, well-  nourished black female in no acute distress.  NECK:  Supple without lymphadenopathy.  LUNGS:  Clear to auscultation with equal breath sounds bilaterally.  HEART:  Reveals a regular rate and rhythm without S3, S4, or murmurs.  BREASTS:  Right breast examination reveals no dominant mass, nipple  discharge, or dimpling.  The axilla is negative for palpable nodes.  Left breast examination reveals a pea-sized ovoid tender nodule in the  outer, lower quadrant and along the chest wall and anterior axillary  line.  It is mobile.   Mammogram and ultrasound reports were reviewed.   IMPRESSION:  Left breast neoplasm.   PLAN:  The patient is scheduled for left breast biopsy on May 28, 2009.  The risks and benefits of the procedure including bleeding and infection  were fully explained to the patient, gave informed consent.      Dalia Heading, M.D.  Electronically Signed     MAJ/MEDQ  D:  05/26/2009  T:  05/27/2009  Job:  [id number]   cc:   Milus Mallick. Lodema Hong, M.D.  Fax: [phone]   Short Stay At [facility]

## 2011-03-15 NOTE — H&P (Signed)
NAMECRICKET, GOODLIN                 ACCOUNT NO.:  [id number]   MEDICAL RECORD NO.:  [id number]          PATIENT TYPE:  AMB   LOCATION:  DAY                           FACILITY:  [facility]   PHYSICIAN:  Ky Barban, M.D.DATE OF BIRTH:  1950-12-26   DATE OF ADMISSION:  DATE OF DISCHARGE:  LH                              HISTORY & PHYSICAL   CHIEF COMPLAINT:  Urgency, frequent UTI. Marland Kitchen   HISTORY:  A 60 year old female who is having recurrent episodes of  urinary tract infections, she has incontinence.  Cystoscopy in the  office shows that she has a bladder neck polyp and otherwise  unremarkable for __________no dysuria.  Her main complaint is urinary  frequency and __________incontinence.  I have advised her that we can do  bladder neck polyp fulguration under anesthesia.  No guarantee about the  results.   PAST HISTORY:  She has hypertension, __________, hysterectomy 20 years  ago, tubal ligation 30 years ago, and right knee arthroscopic surgery 10  years ago.   SOCIAL HISTORY:  Does not smoke or drink.   REVIEW OF SYSTEMS:  Unremarkable.   EXAMINATION:  Blood pressure 115/72, temperature 97.5.  CENTRAL NERVOUS SYSTEM:  Negative.  HEAD/NECK/EYE/ENT:  Negative.  CHEST:  Symmetrical.  HEART:  Regular sinus rhythm, no murmur.  ABDOMEN:  Soft, flat.  Liver, spleen, kidneys are not palpable.  No CVA  tenderness.  No adnexal mass or tenderness.   IMPRESSION:  1. Dysuria.  2. Urgency.  3. Urge incontinence.   PLAN:  Cystoscopy, fulguration of bladder neck polyps with bladder  biopsy under anesthesia as outpatient.      Ky Barban, M.D.  Electronically Signed    MIJ/MEDQ  D:  06/30/2008  T:  06/30/2008  Job:  [id number]

## 2011-03-15 NOTE — Op Note (Signed)
[name], [name]                 ACCOUNT NO.:  [id number]   MEDICAL RECORD NO.:  [id number]          PATIENT TYPE:  AMB   LOCATION:  DAY                           FACILITY:  [facility]   PHYSICIAN:  Dalia Heading, M.D.  DATE OF BIRTH:  Jan 30, 1951   DATE OF PROCEDURE:  05/28/2009  DATE OF DISCHARGE:                               OPERATIVE REPORT   PREOPERATIVE DIAGNOSIS:  Left breast neoplasm.   POSTOPERATIVE DIAGNOSIS:  Left breast neoplasm.   PROCEDURE:  Left breast biopsy.   SURGEON:  Dalia Heading, MD   ANESTHESIA:  General.   INDICATIONS:  The patient is [name] 60 year old, black female, who presents  with [name] tender nodule in the outer, inner quadrant of the left breast  along the chest wall.  Mammograms and ultrasound had been negative.  The  patient now presents for [name] left breast biopsy.  The risks and benefits  of the procedure including bleeding and infection were fully explained  to the patient, gave informed consent.   PROCEDURE NOTE:  The patient was placed in supine position.  After  general anesthesia was administered, the left breast was prepped and  draped in the usual sterile technique with DuraPrep.  Surgical site  confirmation was performed.   [name] curvilinear incision was made over the mass which was in the outer,  lower quadrant of the left breast.  The dissection was taken down to the  mass which was found without difficulty.  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  It was removed and sent to pathology for further examination.  Any  bleeding was controlled using Bovie electrocautery.  The mass was  approximately 1-1/2 cm its greatest diameter.  Any bleeding was  controlled using Bovie electrocautery.  The skin was closed using 4-0  Vicryl subcuticular suture.  [name] 0.5 cm Sensorcaine was instilled into the  surrounding wound.  Dermabond was then applied.   All tape and needle counts correct at the end of the procedure.  The  patient was awakened and transferred to PACU in  stable condition.   COMPLICATIONS:  None.   SPECIMEN:  Left breast neoplasm.   BLOOD LOSS:  Minimal.      Dalia Heading, M.D.  Electronically Signed     MAJ/MEDQ  D:  05/28/2009  T:  05/28/2009  Job:  [id number]   cc:   Milus Mallick. Lodema Hong, M.D.  Fax: [phone]

## 2011-03-18 NOTE — Discharge Summary (Signed)
NAME:  Laura [name], Laura [name]                           ACCOUNT NO.:  [id number]   MEDICAL RECORD NO.:  [id number]                   PATIENT TYPE:  INP   LOCATION:  A304                                 FACILITY:  [facility]   PHYSICIAN:  Dirk Dress. Katrinka Blazing, M.D.                DATE OF BIRTH:  1951-05-28   DATE OF ADMISSION:  09/17/2002  DATE OF DISCHARGE:  09/19/2002                                 DISCHARGE SUMMARY   DISCHARGE DIAGNOSES:  1. Chronic lymphofollicular thyroiditis consistent with Hashimoto's     thyroiditis.  2. Hypertension.  3. History of esophageal reflux disease.  4. Osteoarthritis.   SPECIAL PROCEDURE:  Left thyroid lobectomy on September 17, 2002.   DISPOSITION:  The patient is discharged home in stable satisfactory  condition; her Jackson-Pratt was left intact and this will be removed in the  office.   DISCHARGE MEDICATIONS:  1. Ziac 2.5 mg q.d.  2. Armour Thyroid 60 mg q.d.  3. Prevacid 30 mg q.d.  4. Darvocet-N 100 one or two tabs every 4-6 hours as needed for pain.   FOLLOW UP:  The patient is scheduled to be seen in the office on September 25, 2002.  She desires to have followup with Dr. Patrecia Pace within a month for  continued followup of her thyroiditis.   SUMMARY:  The patient is a 60 year old female with a history of a left lower  pole thyroid mass.  She had tenderness in the mass and had followup by Dr.  Patrecia Pace for over 2 years.  She was on suppressive therapy with Armour  Thyroid 60 mg q.d.  By history the mass increased in size, she developed  more tenderness and some hoarseness over the past few months.  She had a  fine-needle aspirate which was nondiagnostic.  She was referred for thyroid  lobectomy because of increasing size, difficulty with her voice and  increasing tenderness.  It was decided that the patient would have a thyroid  lobectomy with frozen section and a total thyroidectomy if she should have  neoplastic disease.  Past history is positive for  hypertension, history of  esophageal reflux disease and osteoarthritis.  Examination was unremarkable  except for a tender nodule in the left lower pole of the thyroid without  fixation.  The right side of the gland was unremarkable.  The patient was  admitted through day surgery and underwent left thyroid lobectomy on  September 17, 2002.  She had hard firm diffuse thickening of the left lobe  with a hard nodule in the mid portion of the lower pole with two other  smaller nodules.  Parathyroids were identified and appeared to be normal.  The left lobe was also firm but was not nodular.  JP drain was placed  postoperatively.  In the early  postoperative period, the patient had some nausea with vomiting.  She also  had significant bloody  drainage.  She was monitored for 2 days and did well  otherwise.  She was discharged home with her drain intact with plans for  followup in the office.  She will have regular followup with Dr. Patrecia Pace  for her thyroiditis.                                               Dirk Dress. Katrinka Blazing, M.D.    LCS/MEDQ  D:  10/20/2002  T:  10/21/2002  Job:  [id number]   cc:   Alan Mulder, M.D.  [address]  [place]  [place] [address]  Fax: [phone]

## 2011-03-18 NOTE — H&P (Signed)
Laura [name], Laura [name]                 ACCOUNT NO.:  [id number]   MEDICAL RECORD NO.:  [id number]          PATIENT TYPE:  AMB   LOCATION:  DAY                           FACILITY:  [facility]   PHYSICIAN:  Jerolyn Shin C. Katrinka Blazing, M.D.   DATE OF BIRTH:  Sep 02, 1951   DATE OF ADMISSION:  DATE OF DISCHARGE:  LH                                HISTORY & PHYSICAL   A 60 year old female with a history of gastroesophageal reflux disease and  esophagitis.  She states that she had a fall in February 2007 and had  exacerbation of her reflux symptoms.  The symptoms are reportedly worse  during the day.  She has constant nausea.  She uses Protonix 40 mg twice a  day without improvement.  The patient also has progressive constipation and  will need to have colonoscopy.   PAST MEDICAL HISTORY:  She has chronic neck and back pain, hypertension,  hypothyroidism, urinary stress incontinence, and bilateral carpal tunnel  syndrome.   MEDICATIONS:  1.  Synthroid 50 mcg daily.  2.  Calcium with vitamin D three times daily.  3.  __________ and Skelaxin p.r.n.  4.  Atacand 32 mg daily.  5.  Hydrochlorothiazide 25 mg daily.  6.  Vytorin 10/40 daily.  7.  Anaflex 15 mg daily.  8.  Protonix 40 mg twice daily.   PHYSICAL EXAMINATION:  VITAL SIGNS:  Blood pressure 110/72, pulse 68,  respirations 18, weight 179 pounds.  HEENT:  Unremarkable.  NECK:  Supple.  No JVD, bruit, adenopathy, or thyromegaly.  CHEST:  Clear.  HEART:  Regular rhythm without murmur, rub, or gallop.  ABDOMEN:  Epigastric tenderness, normal active bowel sounds, well-healed  incision.  EXTREMITIES:  No clubbing, cyanosis, or edema.  NEUROLOGICAL:  No focal motor, sensory or cerebellar deficits.   IMPRESSION:  1.  Severe gastroesophageal reflux disease.  2.  Change in bowel habits.  3.  Progressive constipation.  4.  Hypertension.  5.  Hypothyroidism.   PLAN:  Esophagogastroduodenoscopy and colonoscopy.      Dirk Dress. Katrinka Blazing, M.D.  Electronically Signed     LCS/MEDQ  D:  02/01/2006  T:  02/01/2006  Job:  [id number]

## 2011-03-18 NOTE — Op Note (Signed)
NAME:  Laura [name], Laura [name]                           ACCOUNT NO.:  [id number]   MEDICAL RECORD NO.:  [id number]                   PATIENT TYPE:  INP   LOCATION:  A304                                 FACILITY:  [facility]   PHYSICIAN:  Dirk Dress. Katrinka Blazing, M.D.                DATE OF BIRTH:  December 29, 1950   DATE OF PROCEDURE:  09/17/2002  DATE OF DISCHARGE:                                 OPERATIVE REPORT   PREOPERATIVE DIAGNOSIS:  Symptomatic left thyroid mass.   POSTOPERATIVE DIAGNOSIS:  Follicular thyroiditis, left thyroid lobe.   PROCEDURE:  Left thyroid lobectomy.   SURGEON:  Dirk Dress. Katrinka Blazing, M.D.   INDICATION FOR PROCEDURE:  A 60 year old female with history of tender mass  of the left lobe of her thyroid for about 2-1/2 years.  The patient had  normal thyroid hormone levels and had treatment with suppression for about  30 months.  During this time the inferior pole of the thyroid gland  gradually enlarged.  The patient developed more pain with hoarseness over  the past four months.  Ultrasound showed no dominant cystic lesion, but  there was fullness on the inferior pole of the gland on the left.  This was  the area of the palpable lesion.  The patient had fine needle aspirates  which were nondiagnostic for neoplastic lesion.  Because of increasing  symptoms and the fact that she had not responded to suppressive therapy with  an indeterminate fine needle aspirate, it was felt that this area needed to  be excised and she was referred to the office by her endocrinologist, Alan Mulder, M.D.   DESCRIPTION OF PROCEDURE:  Under general anesthesia, the patient's neck was  prepped and draped in a sterile field.  A standard low collar incision was  made.  Inferior and superior flaps were developed.  A Mahoney retractor was  placed.  The strap muscles were divided in the midline.  Once the strap  muscles were divided, the left gland was very nodular with a very hard,  thickened lower pole,  which appeared to be quite adherent.  There were no  significant nodes noted, though there was one very small node along the  inferior pole of the gland.  The lateral veins on the left side were  dissected.  They were doubly clipped and divided.  The gland was rotated  superiorly.  The superior pole was dissected and the vascular pedicle was  encircled with two ligatures of 2-0 silk.  The superiormost aspect of the  pedicle was further dissected and another 2-0 silk tie was placed before the  pedicle was divided.  The inferior pole was dissected and controlled with  ligatures of 2-0 silk.  With the gland further mobilized, the recurrent  laryngeal nerve was identified, followed up to its insertion into the  inferior aspect of the larynx.  The isthmus was dissected and  was divided  between two straight clamps.  The remnant on the right side was suture  ligated with 4-0 silk.  The posterior aspect of the gland was dissected.  Once the gland was fully mobilized, inspection was carried out for the  parathyroids.  The inferior and superior parathyroids were identified and  were not violated.  Staying above the insertion of the recurrent laryngeal  nerve and the vascular pedicle of the parathyroids, straight clamps were  used to clamp the gland and the gland was divided over the straight clamps.  It was separated from Berry's fascia and sent for pathologic evaluation.  On  cut section of the gland, the nodule in question was quite firm with a  swirling-type pattern with some central scarring.  While awaiting frozen  section, the right gland was inspected.  There were two small nodules in the  right gland that measured less than 3-4 mm.  The rest of the gland was very  pliable and soft, having tried to isolate the parathyroids on the right  side.  Frozen section diagnosis arrived at about this time, and it was  interpreted as a follicular thyroiditis without any evidence of neoplastic  changes or  malignant changes.  The small remnant of tissue on the left side  was suture ligated with 4-0 silk.  Hemostasis was achieved.  The course of  the nerve was verified, as were the parathyroids.  The gland remnant was  then covered with a small piece of Surgicel.  Hemostasis was adequate at  this time.  A JP drain was placed and brought out through a separate stab  incision.  Strap muscles were closed over the JP drain with 3-0 Biosyn.  Platysma was closed with 3-0 Biosyn.  The skin was closed with a  subcuticular 4-0 Dexon.  The patient tolerated the procedure well.  The  drain was secured with 3-0 nylon.  She was awakened from anesthesia  uneventfully, transferred to her bed, and taken to the postanesthetic care  unit.                                               Dirk Dress. Katrinka Blazing, M.D.    LCS/MEDQ  D:  09/17/2002  T:  09/17/2002  Job:  [id number]   cc:   Alan Mulder, M.D.  [address]  [place]  [place] [address]  Fax: [phone]

## 2011-03-18 NOTE — H&P (Signed)
NAME:  Laura [name], Laura [name]                           ACCOUNT NO.:  [id number]   MEDICAL RECORD NO.:  [id number]                   PATIENT TYPE:  AMB   LOCATION:  DAY                                  FACILITY:  [facility]   PHYSICIAN:  Jerolyn Shin C. Katrinka Blazing, M.D.                DATE OF BIRTH:  04/01/1951   DATE OF ADMISSION:  DATE OF DISCHARGE:                                HISTORY & PHYSICAL   HISTORY OF PRESENT ILLNESS:  A 60 year old female with left lower pole  thyroid mass.  She has had tenderness in the mass and has been followed by  Dr. Ocie Doyne for over two years.  She has been on suppressive therapy with  Armour thyroid 60 mg daily.  On suppression the mass has increased in size.  She has developed tenderness of the mass and she has also developed  hoarseness over the last few months.  Because of these changes, he did fine  needle aspirate, which was nondiagnostic.  She is therefore referred for  right thyroid lobectomy because of increasing size, increasing difficulty  with her voice, and increasing tenderness.  The patient is counseled for  left thyroid lobectomy.  She will have frozen section and if there is any  neoplasia, will proceed with subtotal thyroidectomy.   PAST MEDICAL HISTORY:  She has hypertension, gastroesophageal reflux  disease, osteoarthritis.   PAST SURGICAL HISTORY:  Umbilical hernia repair, hysterectomy, and right  carpal tunnel release.   MEDICATIONS:  1. Ziac 2.5/6.25 mg q.d.  2. Armour thyroid 60 mg q.d.  3. Estratest 1.25/2.5 mg q.d.  4. Prevacid 30 mg q.d.   REVIEW OF SYSTEMS:  She has had a 10-pound weight gain.  She has developed  hoarseness for the last four months.  She has symptomatic heartburn, and she  has recurrent diffuse joint pain.   ALLERGIES:  No known drug allergies.   PHYSICAL EXAMINATION:  GENERAL:  She is a pleasant female in no acute  distress.  VITAL SIGNS:  Blood pressure 130/80, pulse 72, respirations 16, weight 182  pounds, height  5 feet 5 inches.  HEENT:  Unremarkable.  NECK:  Supple.  There is a tender nodule of the left lower pole of the  thyroid without major fixation.  The right side of the thyroid is  unremarkable.  There is no adenopathy.  CHEST:  Clear to auscultation.  CARDIAC:  Regular rate and rhythm without murmur, gallop, or rub  ABDOMEN:  Soft, nontender, no masses.  EXTREMITIES:  No cyanosis, clubbing, or edema.  She does have crepitus of  both knees and her ankles.  NEUROLOGIC:  No focal motor, sensory, or cerebellar deficit.   IMPRESSION:  1. Symptomatic nodule, left lobe of thyroid.  2. Gastroesophageal reflux disease.  3. Osteoarthritis.  4. Hypertension.   PLAN:  Right thyroid lobectomy.  Dirk Dress. Katrinka Blazing, M.D.    LCS/MEDQ  D:  09/16/2002  T:  09/17/2002  Job:  [id number]

## 2011-03-29 ENCOUNTER — Encounter: Payer: Self-pay | Admitting: Family Medicine

## 2011-03-30 ENCOUNTER — Encounter: Payer: Self-pay | Admitting: Family Medicine

## 2011-03-30 ENCOUNTER — Ambulatory Visit (INDEPENDENT_AMBULATORY_CARE_PROVIDER_SITE_OTHER): Payer: BC Managed Care – PPO | Admitting: Family Medicine

## 2011-03-30 ENCOUNTER — Other Ambulatory Visit (HOSPITAL_COMMUNITY)
Admission: RE | Admit: 2011-03-30 | Discharge: 2011-03-30 | Disposition: A | Discharge status: Home / Self Care | Payer: BC Managed Care – PPO | Source: Ambulatory Visit | Attending: Family Medicine | Admitting: Family Medicine

## 2011-03-30 VITALS — BP 130/80 | HR 65 | Resp 16 | Ht 64.0 in | Wt 168.4 lb

## 2011-03-30 DIAGNOSIS — Z01419 Encounter for gynecological examination (general) (routine) without abnormal findings: Secondary | ICD-10-CM | POA: Insufficient documentation

## 2011-03-30 DIAGNOSIS — Z2911 Encounter for prophylactic immunotherapy for respiratory syncytial virus (RSV): Secondary | ICD-10-CM

## 2011-03-30 DIAGNOSIS — Z23 Encounter for immunization: Secondary | ICD-10-CM

## 2011-03-30 DIAGNOSIS — R32 Unspecified urinary incontinence: Secondary | ICD-10-CM

## 2011-03-30 DIAGNOSIS — Z0000 Encounter for general adult medical examination without abnormal findings: Secondary | ICD-10-CM

## 2011-03-30 DIAGNOSIS — I10 Essential (primary) hypertension: Secondary | ICD-10-CM

## 2011-03-30 DIAGNOSIS — R1013 Epigastric pain: Secondary | ICD-10-CM

## 2011-03-30 DIAGNOSIS — F411 Generalized anxiety disorder: Secondary | ICD-10-CM

## 2011-03-30 DIAGNOSIS — Z1211 Encounter for screening for malignant neoplasm of colon: Secondary | ICD-10-CM

## 2011-03-30 LAB — POC HEMOCCULT BLD/STL (OFFICE/1-CARD/DIAGNOSTIC): Fecal Occult Blood, POC: NEGATIVE

## 2011-03-30 MED ORDER — PANTOPRAZOLE SODIUM 40 MG PO TBEC: 40.0000 mg | DELAYED_RELEASE_TABLET | Freq: Every day | ORAL | Status: DC

## 2011-03-30 MED ORDER — TOLTERODINE TARTRATE ER 4 MG PO CP24: 4.0000 mg | ORAL_CAPSULE | Freq: Every day | ORAL | Status: DC

## 2011-03-30 MED ORDER — LOVASTATIN 40 MG PO TABS: ORAL_TABLET | ORAL | Status: DC

## 2011-03-30 MED ORDER — ZOSTER VACCINE LIVE 19400 UNT/0.65ML ~~LOC~~ SOLR: 0.6500 mL | Freq: Once | SUBCUTANEOUS | Status: DC

## 2011-03-30 NOTE — Patient Instructions (Addendum)
F/u in  4 months.  Shingles vaccine today , new med for incontinence  It is important that you exercise regularly at least 30 minutes 5 times a week. If you develop chest pain, have severe difficulty breathing, or feel very tired, stop exercising immediately and seek medical attention    A healthy diet is rich in fruit, vegetables and whole grains. Poultry fish, nuts and beans are a healthy choice for protein rather then red meat. A low sodium diet and drinking 64 ounces of water daily is generally recommended. Oils and sweet should be limited. Carbohydrates especially for those who are diabetic or overweight, should be limited to 34-45 gram per meal. It is important to eat on a regular schedule, at least 3 times daily. Snacks should be primarily fruits, vegetables or nuts.

## 2011-04-01 ENCOUNTER — Encounter: Payer: Self-pay | Admitting: *Deleted

## 2011-04-05 ENCOUNTER — Other Ambulatory Visit: Payer: Self-pay | Admitting: Family Medicine

## 2011-04-06 MED ORDER — PANTOPRAZOLE SODIUM 40 MG PO TBEC: 40.0000 mg | DELAYED_RELEASE_TABLET | Freq: Two times a day (BID) | ORAL | Status: DC

## 2011-04-06 NOTE — Telephone Encounter (Signed)
Resent for twice daily. Patient aware

## 2011-04-10 NOTE — Assessment & Plan Note (Signed)
Controlled, no change in medication  

## 2011-04-10 NOTE — Progress Notes (Signed)
  Subjective:    Patient ID: Laura [name], female    DOB: 1951-10-26, 60 y.o.   MRN: [id number]  HPI The PT is here for annual exam  and re-evaluation of chronic medical conditions, medication management and review of recent lab and radiology data.  Preventive health is updated, specifically  Cancer screening, Osteoporosis screening and Immunization.   Questions or concerns regarding consultations or procedures which the PT has had in the interim are  addressed. The PT denies any adverse reactions to current medications since the last visit.  There are no new concerns.States her nausea, bloating and abdominal pain have all essentially been corrected on current regime  There are no specific complaints       Review of Systems Denies recent fever or chills. Denies sinus pressure, nasal congestion, ear pain or sore throat. Denies chest congestion, productive cough or wheezing. Denies chest pains, palpitations, paroxysmal nocturnal dyspnea, orthopnea and leg swelling Denies , vomiting,diarrhea or constipation.  Denies rectal bleeding or change in bowel movement. Denies dysuria, frequency, hesitancy, but c/o incontinence. Denies joint pain, swelling and limitation in mobility. Denies headaches, seizure, numbness, or tingling. Denies depression, anxiety or insomnia. Denies skin break down or rash.        Objective:   Physical Exam    Pleasant well nourished female, alert and oriented x 3, in no cardio-pulmonary distress. Afebrile. HEENT No facial trauma or asymetry.   EOMI, PERTL, fundoscopic exam is normal, no hemorhage or exudate. No papiledema External ears normal, tympanic membranes clear. Oropharynx moist, no exudate, good dentition. Neck: supple, no adenopathy,JVD or thyromegaly.No bruits.  Chest: Clear to ascultation bilaterally.No crackles or wheezes. Non tender to palpation  Breast: No asymetry,no masses. No nipple discharge or inversion. No axillary or  supraclavicular adenopathy  Cardiovascular system; Heart sounds normal,  S1 and  S2 ,no S3.  No murmur, or thrill. Apical beat not displaced Peripheral pulses normal.  Abdomen: Soft, non tender, no organomegaly or masses. No bruits. Bowel sounds normal. No guarding, tenderness or rebound.  Rectal:  No mass. guaic negative stool.  GU: External genitalia normal. No lesions. Vaginal canal normal.No discharge. Uterus absent  no adnexal masses, no  adnexal tenderness.  Musculoskeletal exam: Full ROM of spine, hips , shoulders and knees. No deformity ,swelling or crepitus noted. No muscle wasting or atrophy.   Neurologic: Cranial nerves 2 to 12 intact. Power, tone ,sensation and reflexes normal throughout. No disturbance in gait. No tremor.  Skin: Intact, no ulceration, erythema , scaling or rash noted. Pigmentation normal throughout  Psych; Normal mood and affect. Judgement and concentration normal     Assessment & Plan:

## 2011-04-10 NOTE — Assessment & Plan Note (Signed)
Deteriorated, new med prescribed

## 2011-04-10 NOTE — Assessment & Plan Note (Signed)
improved

## 2011-04-10 NOTE — Assessment & Plan Note (Signed)
Improved since GI symptoms have improved

## 2011-06-03 ENCOUNTER — Encounter: Payer: Self-pay | Admitting: Family Medicine

## 2011-06-06 ENCOUNTER — Ambulatory Visit (INDEPENDENT_AMBULATORY_CARE_PROVIDER_SITE_OTHER): Payer: BC Managed Care – PPO | Admitting: Family Medicine

## 2011-06-06 ENCOUNTER — Encounter: Payer: Self-pay | Admitting: Family Medicine

## 2011-06-06 VITALS — BP 112/82 | HR 60 | Resp 16 | Ht 64.0 in | Wt 170.4 lb

## 2011-06-06 DIAGNOSIS — I10 Essential (primary) hypertension: Secondary | ICD-10-CM

## 2011-06-06 DIAGNOSIS — F411 Generalized anxiety disorder: Secondary | ICD-10-CM

## 2011-06-06 DIAGNOSIS — E785 Hyperlipidemia, unspecified: Secondary | ICD-10-CM

## 2011-06-06 DIAGNOSIS — D649 Anemia, unspecified: Secondary | ICD-10-CM

## 2011-06-06 DIAGNOSIS — R5381 Other malaise: Secondary | ICD-10-CM

## 2011-06-06 DIAGNOSIS — E890 Postprocedural hypothyroidism: Secondary | ICD-10-CM

## 2011-06-06 DIAGNOSIS — R1013 Epigastric pain: Secondary | ICD-10-CM

## 2011-06-06 MED ORDER — DICYCLOMINE HCL 10 MG PO CAPS: ORAL_CAPSULE | ORAL | Status: DC

## 2011-06-06 MED ORDER — IMIPRAMINE HCL 25 MG PO TABS: 25.0000 mg | ORAL_TABLET | Freq: Every day | ORAL | Status: DC

## 2011-06-06 NOTE — Progress Notes (Signed)
  Subjective:    Patient ID: Laura [name], female    DOB: 18-May-1951, 60 y.o.   MRN: [id number]  HPI Pt reports that since 07/15 to present she has started hving severe epigastric pain , her stomach feels "warm'. Ate mushrooms for breakfast on 7/15, had a 10 plus abdominal pain, since that time she continues to expereince pain at a 6. Wants/needs GI re-eval Had diarreah and vomitting 9 days ago, for 1 day , which has resolved. Staes every morning feels epigastric cramping, which remains after the  Bowel movement. Requesting FMLA form be completed since abdominal symptoms are again a problem, wants to start with local GI doc before going back to tertiary center where she was last seen  Review of Systems See HPI Denies recent fever or chills. Denies sinus pressure, nasal congestion, ear pain or sore throat. Denies chest congestion, productive cough or wheezing. Denies chest pains, palpitations and leg swelling  Denies dysuria, frequency, hesitancy or incontinence. Denies joint pain, swelling and limitation in mobility. Denies headaches, seizures, numbness, or tingling. Denies depression, anxiety or insomnia. Denies skin break down or rash.        Objective:   Physical Exam Patient alert and oriented and in no cardiopulmonary distress.  HEENT: No facial asymmetry, EOMI, no sinus tenderness,  oropharynx pink and moist.  Neck supple no adenopathy.  Chest: Clear to auscultation bilaterally.  CVS: S1, S2 no murmurs, no S3.  ABD: Soft non tender. Bowel sounds normal.  Ext: No edema  MS: Adequate ROM spine, shoulders, hips and knees.  Skin: Intact, no ulcerations or rash noted.  Psych: Good eye contact, normal affect. Memory intact not anxious or depressed appearing.  CNS: CN 2-12 intact, power, tone and sensation normal throughout.        Assessment & Plan:

## 2011-06-06 NOTE — Patient Instructions (Addendum)
F/u in 3 months.  You are being referred to Dr fields about your new stomach pain  I will send in 1 month of imiparamine, pls resume and discuss with Dr Barrie Dunker will be completed  Mammogram due 10/31 or after, pls schedule.  LABWORK  NEEDS TO BE DONE BETWEEN 3 TO 7 DAYS BEFORE YOUR NEXT SCEDULED  VISIT.  THIS WILL IMPROVE THE QUALITY OF YOUR CARE.   Fasting labs just before next visit

## 2011-06-08 NOTE — Assessment & Plan Note (Signed)
Controlled, no change in medication  

## 2011-06-08 NOTE — Assessment & Plan Note (Addendum)
Reports deterioration in symptoms , pt to return to GI for f/u, prescription for 1 month only of imipramine sent in until she sees GI again, has been on this in the past

## 2011-06-08 NOTE — Assessment & Plan Note (Signed)
Controlled and improved on lexapro

## 2011-06-08 NOTE — Assessment & Plan Note (Signed)
Controlled and now followed by local endocrinologist

## 2011-06-13 ENCOUNTER — Telehealth: Payer: Self-pay | Admitting: Family Medicine

## 2011-06-13 ENCOUNTER — Encounter: Payer: Self-pay | Admitting: Gastroenterology

## 2011-06-13 NOTE — Telephone Encounter (Signed)
pls call pt let her know she can collect, up front

## 2011-06-13 NOTE — Telephone Encounter (Signed)
Called patient, not available 

## 2011-06-14 ENCOUNTER — Encounter: Payer: Self-pay | Admitting: Gastroenterology

## 2011-06-14 ENCOUNTER — Ambulatory Visit (INDEPENDENT_AMBULATORY_CARE_PROVIDER_SITE_OTHER): Payer: BC Managed Care – PPO | Admitting: Gastroenterology

## 2011-06-14 VITALS — BP 121/75 | HR 64 | Temp 97.2°F | Ht 64.0 in | Wt 167.0 lb

## 2011-06-14 DIAGNOSIS — D649 Anemia, unspecified: Secondary | ICD-10-CM

## 2011-06-14 DIAGNOSIS — K219 Gastro-esophageal reflux disease without esophagitis: Secondary | ICD-10-CM

## 2011-06-14 DIAGNOSIS — K3189 Other diseases of stomach and duodenum: Secondary | ICD-10-CM

## 2011-06-14 DIAGNOSIS — R1013 Epigastric pain: Secondary | ICD-10-CM

## 2011-06-14 NOTE — Progress Notes (Signed)
Cc to PCP 

## 2011-06-14 NOTE — Assessment & Plan Note (Addendum)
History of anemia with low iron and iron saturations but normal ferritin. Hemoccult-negative stool in May 2012. Last colonoscopy in 2006. Patient has pending lab work. Will followup when available and make further recommendations.

## 2011-06-14 NOTE — Progress Notes (Signed)
Primary Care Physician:  Syliva Overman, MD, MD  Primary Gastroenterologist:  Jonette Eva, MD   Chief Complaint  Patient presents with  . Abdominal Pain    burning in stomach    HPI:  Laura [name] is a 60 y.o. female here for further evaluation of ongoing epigastric pain. She has a history of chronic intermittent epigastric pain. Has been evaluated at [facility] Dr. Alycia Rossetti. Earlier this year she had a normal esophageal manometry, gastric emptying study, hydrogen breath test. PH study showed increased acid reflux 3.8% of the time pH less than 4 but no correlation with symptoms. It was felt that her symptoms were irritable bowel variant. Her Bentyl was increased 4 times daily. Patient states at that time her imipramine was stopped. Was doing well until July 15th. Ate an onion and mushroom omelette and within an hour started having epigastric pain. Feels warm in epigastric. Abdominal pain always associated with bowel movement. Work schedule crazy, sometimes has to go in at 3 AM. Dr. Lodema Hong restarted imipramine last week but she hasn't noticed any improvement yet. Sometimes wake up with pain and knows she has to have BM. Pain always followed by BM. Severe pain with BM. BM 2-3 in a row. BM soft. No brbpr, melena. No heartburn on Protonix twice a day. Refractory symptoms on once daily. No dysphagia. No significant weight loss.    Current Outpatient Prescriptions  Medication Sig Dispense Refill  . dicyclomine (BENTYL) 10 MG capsule Take one tab four times daily  120 capsule  4  . escitalopram (LEXAPRO) 5 MG tablet Take 5 mg by mouth daily. Take one tablet by mouth once a day       . imipramine (TOFRANIL) 25 MG tablet Take 1 tablet (25 mg total) by mouth at bedtime.  30 tablet  0  . lovastatin (MEVACOR) 40 MG tablet Take two tabs by mouth at bedtime for cholesterol   60 tablet  4  . pantoprazole (PROTONIX) 40 MG tablet Take 1 tablet (40 mg total) by mouth 2 (two) times daily.  60 tablet  4  .  SYNTHROID 100 MCG tablet Take 100 mcg by mouth daily.       Marland Kitchen tolterodine (DETROL LA) 4 MG 24 hr capsule Take 1 capsule (4 mg total) by mouth daily.  30 capsule  2  . valsartan-hydrochlorothiazide (DIOVAN HCT) 160-12.5 MG per tablet Take 1 tablet by mouth daily. Take one tablet by mouth once a day  30 tablet  3      Allergies as of 06/14/2011 - Review Complete 06/14/2011  Allergen Reaction Noted  . Dilaudid (hydromorphone hcl)  03/01/2011  . Nsaids  01/13/2010    Past Medical History  Diagnosis Date  . GERD (gastroesophageal reflux disease)   . Gastritis   . Hypertension   . Hyperlipidemia   . Anemia   . Cystitis   . Osteopenia   . Shoulder pain   . Hypothyroidism   . Hashimoto's thyroiditis     Hx   . Urinary incontinence     Past Surgical History  Procedure Date  . Total abdominal hysterectomy 1994  . Umbilical hernia repair   . Breast excisional biopsy 2010    Excisional biopsy of benign left breast mass -lopoma   . Resection of left lobe of thyroid   . Bilateral tubal ligation   . Right carpal tunnel release   . Urethral dilation for stenosis 2009  . Rt. knee athroscopy 2004  . Colonscopy 2005  Dr. Katrinka Blazing  . Esophagogastroduodenoscopy 12/2009    chronic gastritis    Family History  Problem Relation Age of Onset  . Colon cancer Neg Hx     History   Social History  . Marital Status: Married    Spouse Name: N/A    Number of Children: 3  . Years of Education: N/A   Occupational History  . 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 Smoking status: Former Smoker    Types: Cigarettes  . Smokeless tobacco: Not on file  . Alcohol Use: No  . Drug Use: No  . Sexually Active: Not on file   Other Topics Concern  . Not on file   Social History Narrative  . No narrative on file      ROS:  General: Negative for anorexia, weight loss, fever, chills, fatigue, weakness. Eyes: Negative for vision changes.  ENT: Negative  for hoarseness, difficulty swallowing , nasal congestion. CV: Negative for chest pain, angina, palpitations, dyspnea on exertion, peripheral edema.  Respiratory: Negative for dyspnea at rest, dyspnea on exertion, cough, sputum, wheezing.  GI: See history of present illness. GU:  Negative for dysuria, hematuria, urinary incontinence, urinary frequency, nocturnal urination.  MS: Negative for joint pain, low back pain.  Derm: Negative for rash or itching.  Neuro: Negative for weakness, abnormal sensation, seizure, frequent headaches, memory loss, confusion.  Psych: Negative for anxiety, depression, suicidal ideation, hallucinations.  Endo: Negative for unusual weight change.  Heme: Negative for bruising or bleeding. Allergy: Negative for rash or hives.    Physical Examination:  BP 121/75  Pulse 64  Temp(Src) 97.2 F (36.2 C) (Temporal)  Ht 5\' 4"  (1.626 m)  Wt 167 lb (75.751 kg)  BMI 28.67 kg/m2   General: Well-nourished, well-developed in no acute distress.  Head: Normocephalic, atraumatic.   Eyes: Conjunctiva pink, no icterus. Mouth: Oropharyngeal mucosa moist and pink , no lesions erythema or exudate. Neck: Supple without thyromegaly, masses, or lymphadenopathy.  Lungs: Clear to auscultation bilaterally.  Heart: Regular rate and rhythm, no murmurs rubs or gallops.  Abdomen: Bowel sounds are normal, moderate epigastric tenderness, nondistended, no hepatosplenomegaly or masses, no abdominal bruits or    hernia , no rebound or guarding.   Rectal: Not performed. Extremities: No lower extremity edema. No clubbing or deformities.  Neuro: Alert and oriented x 4 , grossly normal neurologically.  Skin: Warm and dry, no rash or jaundice.   Psych: Alert and cooperative, normal mood and affect.

## 2011-06-14 NOTE — Progress Notes (Signed)
Agree. EGD NOT INDICATED.

## 2011-06-14 NOTE — Assessment & Plan Note (Signed)
Chronic intermittent epigastric pain. Gallbladder workup negative in 2010 including HIDA scan and abdominal ultrasound. Last EGD in 2011. Chronic gastritis on biopsy. Symptoms felt to be due to non-ulcerative dyspepsia and/or irritable bowel variant. Seen at [facility] by Dr. Alycia Rossetti with negative esophageal manometry, gastric emptying study, hydrogen breath test. Her pH study showed increased acid reflux at 3.8% less than pH of 4 however no correlation with symptoms.  Non-ulcerative dyspepsia diet sheet provided. Patient request another EGD but I don't feel it's warranted at this time. I will discuss this with Dr. Darrick Penna however.  Continue imipramine 25 mg daily for now. We may increase to 50 mg if no improvement of symptoms after the next few weeks.

## 2011-06-14 NOTE — Telephone Encounter (Signed)
Lucio Edward said patient already collected

## 2011-06-14 NOTE — Patient Instructions (Signed)
Diet for GERD or PUD Nutrition therapy can help ease the discomfort of gastroesophageal reflux disease (GERD) and peptic ulcer disease (PUD).  HOME CARE INSTRUCTIONS  Eat your meals slowly, in a relaxed setting.   Eat 5 to 6 small meals per day.   If a food causes distress, stop eating it for a period of time.  FOODS TO AVOID:  Coffee, regular or decaffeinated.  Cola beverages, regular or low calorie.   Tea, regular or decaffeinated.   Pepper.   Cocoa.   High fat foods including meats.   Butter, margarine, hydrogenated oil (trans fats).  Peppermint or spearmint (if you have GERD).   Fruits and vegetables as tolerated.   Alcoholic beverages.   Nicotine (smoking or chewing). This is one of the most potent stimulants to acid production in the gastrointestinal tract.   Any food that seems to aggravate your condition.   If you have questions regarding your diet, call your caregiver's office or a registered dietitian. OTHER TIPS IF YOU HAVE GERD:  Lying flat may make symptoms worse. Keep the head of your bed raised 6 to 9 inches by using a foam wedge or blocks under the legs of the bed.   Do not lay down until 3 hours after eating a meal.    Please also refer to diet sheet provided.   Daily physical activity may help reduce symptoms.  MAKE SURE YOU:   Understand these instructions.   Will watch your condition.   Will get help right away if you are not doing well or get worse.  Document Released: 10/17/2005 Document Re-Released: 03/05/2009 [facility] Patient Information 2011 [place], [place].

## 2011-06-14 NOTE — Assessment & Plan Note (Signed)
GERD well controlled on pantoprazole 40 mg twice a day. Symptoms recur when taking it only once daily.

## 2011-07-13 ENCOUNTER — Encounter: Payer: Self-pay | Admitting: Family Medicine

## 2011-07-14 ENCOUNTER — Other Ambulatory Visit: Payer: Self-pay

## 2011-07-14 ENCOUNTER — Other Ambulatory Visit: Payer: Self-pay | Admitting: Family Medicine

## 2011-07-14 ENCOUNTER — Telehealth: Payer: Self-pay | Admitting: Gastroenterology

## 2011-07-14 ENCOUNTER — Encounter: Payer: Self-pay | Admitting: Family Medicine

## 2011-07-14 ENCOUNTER — Ambulatory Visit (INDEPENDENT_AMBULATORY_CARE_PROVIDER_SITE_OTHER): Payer: BC Managed Care – PPO | Admitting: Family Medicine

## 2011-07-14 VITALS — BP 120/70 | HR 73 | Resp 16 | Ht 64.0 in | Wt 168.1 lb

## 2011-07-14 DIAGNOSIS — R32 Unspecified urinary incontinence: Secondary | ICD-10-CM

## 2011-07-14 DIAGNOSIS — R1013 Epigastric pain: Secondary | ICD-10-CM

## 2011-07-14 DIAGNOSIS — D649 Anemia, unspecified: Secondary | ICD-10-CM

## 2011-07-14 DIAGNOSIS — M5430 Sciatica, unspecified side: Secondary | ICD-10-CM

## 2011-07-14 DIAGNOSIS — I10 Essential (primary) hypertension: Secondary | ICD-10-CM

## 2011-07-14 DIAGNOSIS — Z23 Encounter for immunization: Secondary | ICD-10-CM

## 2011-07-14 DIAGNOSIS — E785 Hyperlipidemia, unspecified: Secondary | ICD-10-CM

## 2011-07-14 DIAGNOSIS — F411 Generalized anxiety disorder: Secondary | ICD-10-CM

## 2011-07-14 DIAGNOSIS — K219 Gastro-esophageal reflux disease without esophagitis: Secondary | ICD-10-CM

## 2011-07-14 DIAGNOSIS — T7840XA Allergy, unspecified, initial encounter: Secondary | ICD-10-CM

## 2011-07-14 MED ORDER — KETOROLAC TROMETHAMINE 60 MG/2ML IM SOLN
60.0000 mg | Freq: Once | INTRAMUSCULAR | Status: AC
  Administered 2011-07-14: 60 mg via INTRAMUSCULAR

## 2011-07-14 MED ORDER — MELOXICAM 15 MG PO TABS: ORAL_TABLET | ORAL | Status: DC

## 2011-07-14 MED ORDER — INFLUENZA VAC TYPES A & B PF IM SUSP: 0.5000 mL | Freq: Once | INTRAMUSCULAR | Status: DC

## 2011-07-14 MED ORDER — VALSARTAN-HYDROCHLOROTHIAZIDE 160-12.5 MG PO TABS: 1.0000 | ORAL_TABLET | Freq: Every day | ORAL | Status: DC

## 2011-07-14 MED ORDER — IMIPRAMINE HCL 25 MG PO TABS: 25.0000 mg | ORAL_TABLET | Freq: Every day | ORAL | Status: DC

## 2011-07-14 MED ORDER — METHYLPREDNISOLONE ACETATE 80 MG/ML IJ SUSP
80.0000 mg | Freq: Once | INTRAMUSCULAR | Status: AC
  Administered 2011-07-14: 80 mg via INTRAMUSCULAR

## 2011-07-14 MED ORDER — PREDNISONE (PAK) 5 MG PO TABS: 5.0000 mg | ORAL_TABLET | ORAL | Status: DC

## 2011-07-14 NOTE — Progress Notes (Signed)
  Subjective:    Patient ID: Laura [name], female    DOB: [birth year], 60 y.o.   MRN: [id number]  HPI  1 month h/o LBP to left butock down posterior thigh to back of knee, no aggravating factor noted, denies lower extremity weakness or numbness, denies incontinence of stool or urine. Reports improvement in her abdominal pain, has seen the local GI doc  Review of Systems Denies recent fever or chills. Denies sinus pressure, nasal congestion, ear pain or sore throat. Denies chest congestion, productive cough or wheezing. Denies chest pains, palpitations and leg swelling Denies abdominal pain, nausea, vomiting,diarrhea or constipation.   Denies dysuria, frequency, hesitancy or incontinence. Denies headaches, seizures, numbness, or tingling. Denies depression, anxiety or insomnia. Denies skin break down or rash.       Objective:   Physical Exam Patient alert and oriented and in no cardiopulmonary distress.  HEENT: No facial asymmetry, EOMI, no sinus tenderness,  oropharynx pink and moist.  Neck supple no adenopathy.  Chest: Clear to auscultation bilaterally.  CVS: S1, S2 no murmurs, no S3.  ABD: Soft non tender. Bowel sounds normal.  Ext: No edema  MS: Decreased  ROM spine,adequate in  shoulders, hips and knees.  Skin: Intact, no ulcerations or rash noted.  Psych: Good eye contact, normal affect. Memory intact not anxious or depressed appearing.  CNS: CN 2-12 intact, power, tone and sensation normal throughout.        Assessment & Plan:

## 2011-07-14 NOTE — Telephone Encounter (Signed)
LM for pt to call

## 2011-07-14 NOTE — Patient Instructions (Signed)
F/U in January  You are being treated for acute sciatica, you will injections in the office and medication also sent in, start tabs tomorrow  Flu vaccine today.  It is important that you exercise regularly at least 30 minutes 5 times a week. If you develop chest pain, have severe difficulty breathing, or feel very tired, stop exercising immediately and seek medical attention    Please call if you need me

## 2011-07-14 NOTE — Telephone Encounter (Signed)
Please let pt know. SLF recommends checking CBC now to see if still anemic. No EGD indicated but if anemic then will offer TCS.

## 2011-07-15 ENCOUNTER — Telehealth: Payer: Self-pay | Admitting: *Deleted

## 2011-07-15 LAB — ANEMIA PANEL
Folate: 19.6 ng/mL
Iron: 63 ug/dL (ref 42–145)
RBC.: 3.84 MIL/uL — ABNORMAL LOW (ref 3.87–5.11)
Retic Ct Pct: 1.2 % (ref 0.4–2.3)
UIBC: 181 ug/dL (ref 125–400)
Vitamin B-12: 1396 pg/mL — ABNORMAL HIGH (ref 211–911)

## 2011-07-15 LAB — LIPID PANEL
Cholesterol: 177 mg/dL (ref 0–200)
HDL: 51 mg/dL (ref >39)
Triglycerides: 110 mg/dL (ref <150)

## 2011-07-15 LAB — CBC WITH DIFFERENTIAL/PLATELET
Basophils Absolute: 0 10*3/uL (ref 0.0–0.1)
Basophils Relative: 1 % (ref 0–1)
Eosinophils Absolute: 0.1 10*3/uL (ref 0.0–0.7)
MCH: 27.7 pg (ref 26.0–34.0)
MCHC: 31.6 g/dL (ref 30.0–36.0)
Neutro Abs: 2.4 10*3/uL (ref 1.7–7.7)
Neutrophils Relative %: 51 % (ref 43–77)
Platelets: 254 10*3/uL (ref 150–400)
RDW: 12.7 % (ref 11.5–15.5)

## 2011-07-15 LAB — BASIC METABOLIC PANEL
BUN: 17 mg/dL (ref 6–23)
Chloride: 101 mEq/L (ref 96–112)
Creat: 1.02 mg/dL (ref 0.50–1.10)
Potassium: 4.1 mEq/L (ref 3.5–5.3)

## 2011-07-15 LAB — HEPATIC FUNCTION PANEL
ALT: 15 U/L (ref 0–35)
Albumin: 4 g/dL (ref 3.5–5.2)
Alkaline Phosphatase: 66 U/L (ref 39–117)
Indirect Bilirubin: 0.4 mg/dL (ref 0.0–0.9)
Total Protein: 7.2 g/dL (ref 6.0–8.3)

## 2011-07-15 NOTE — Telephone Encounter (Signed)
Message copied by Diamantina Monks on Fri Jul 15, 2011  3:46 PM ------      Message from: Syliva Overman MD E      Created: Fri Jul 15, 2011  7:02 AM       pls add anemia panel

## 2011-07-15 NOTE — Telephone Encounter (Signed)
Test added.   

## 2011-07-18 NOTE — Assessment & Plan Note (Addendum)
Stable at this time 

## 2011-07-18 NOTE — Assessment & Plan Note (Signed)
Controlled, no change in medication  

## 2011-07-18 NOTE — Telephone Encounter (Signed)
LMOM at home to call. Called work, not allowed calls unless it is an emergency.

## 2011-07-18 NOTE — Assessment & Plan Note (Signed)
Acute episode, will manage aggressively with anti inflammatories

## 2011-07-19 ENCOUNTER — Other Ambulatory Visit: Payer: Self-pay | Admitting: Gastroenterology

## 2011-07-19 NOTE — Telephone Encounter (Signed)
Pt called. Aware she needs CBC. Order faxed to [facility].

## 2011-07-20 ENCOUNTER — Telehealth: Payer: Self-pay | Admitting: *Deleted

## 2011-07-20 LAB — CBC WITH DIFFERENTIAL/PLATELET
Eosinophils Absolute: 0 10*3/uL (ref 0.0–0.7)
Hemoglobin: 10.3 g/dL — ABNORMAL LOW (ref 12.0–15.0)
Lymphocytes Relative: 31 % (ref 12–46)
Lymphs Abs: 2.2 10*3/uL (ref 0.7–4.0)
MCH: 27.4 pg (ref 26.0–34.0)
MCV: 87.2 fL (ref 78.0–100.0)
Monocytes Relative: 9 % (ref 3–12)
Neutrophils Relative %: 59 % (ref 43–77)
Platelets: 263 10*3/uL (ref 150–400)
RBC: 3.76 MIL/uL — ABNORMAL LOW (ref 3.87–5.11)
WBC: 7.1 10*3/uL (ref 4.0–10.5)

## 2011-07-20 NOTE — Telephone Encounter (Signed)
Called patient, left message.

## 2011-07-20 NOTE — Telephone Encounter (Signed)
Message copied by Diamantina Monks on Wed Jul 20, 2011  1:57 PM ------      Message from: Syliva Overman MD E      Created: Sat Jul 16, 2011  9:23 AM       pls advise iron and folate are good. If she is taking B12 she needs to stop her vit B level is too high, pls give her the result also

## 2011-07-20 NOTE — Telephone Encounter (Signed)
Message copied by Diamantina Monks on Wed Jul 20, 2011  9:54 AM ------      Message from: Syliva Overman MD E      Created: Sat Jul 16, 2011  9:23 AM       pls advise iron and folate are good. If she is taking B12 she needs to stop her vit B level is too high, pls give her the result also

## 2011-07-20 NOTE — Telephone Encounter (Signed)
Patient aware of lab results.

## 2011-07-22 ENCOUNTER — Other Ambulatory Visit: Payer: Self-pay | Admitting: Family Medicine

## 2011-07-22 DIAGNOSIS — Z139 Encounter for screening, unspecified: Secondary | ICD-10-CM

## 2011-07-25 NOTE — Progress Notes (Signed)
Quick Note:  Pt informed. Scheduled for OV on 07/27/2011 with Tana Coast, NP. ______

## 2011-07-25 NOTE — Progress Notes (Signed)
Quick Note:  Needs OV for anemia and to schedule TCS. SLF recommends TCS but needs updated H+P first. ______

## 2011-07-25 NOTE — Progress Notes (Signed)
Quick Note:  LMOM to call. ______ 

## 2011-07-27 ENCOUNTER — Encounter: Payer: Self-pay | Admitting: Gastroenterology

## 2011-07-27 ENCOUNTER — Ambulatory Visit (INDEPENDENT_AMBULATORY_CARE_PROVIDER_SITE_OTHER): Payer: BC Managed Care – PPO | Admitting: Gastroenterology

## 2011-07-27 VITALS — BP 128/81 | HR 79 | Temp 97.5°F | Ht 61.0 in | Wt 168.4 lb

## 2011-07-27 DIAGNOSIS — K3189 Other diseases of stomach and duodenum: Secondary | ICD-10-CM

## 2011-07-27 DIAGNOSIS — K5900 Constipation, unspecified: Secondary | ICD-10-CM

## 2011-07-27 DIAGNOSIS — R1013 Epigastric pain: Secondary | ICD-10-CM

## 2011-07-27 DIAGNOSIS — D649 Anemia, unspecified: Secondary | ICD-10-CM

## 2011-07-27 NOTE — Assessment & Plan Note (Signed)
Hold dicyclomine when constipated. Constipation appears to be rare, okay to use dulcolax prn. TCS as planned.

## 2011-07-27 NOTE — Progress Notes (Signed)
Cc to PCP 

## 2011-07-27 NOTE — Assessment & Plan Note (Signed)
Chronic abdominal pain, possibly a variant of irritable bowel. Extensive workup as outlined in the history of present illness. Diet provided to the patient. Continue the imipramine and dicyclomine. She was advised to hold dicyclomine when/if she develops constipation until her bowels are moving more regularly. Would prefer her to avoid NSAIDs but if required she should be taking PPI with them.

## 2011-07-27 NOTE — Assessment & Plan Note (Addendum)
Chronic anemia. Hemoccult negative in May. Etiology unknown. No evidence of iron deficiency. As discussed with Dr. Darrick Penna previously, plan for colonoscopy at this point. If colonoscopy is unrevealing, may consider rechecking ifobt. If no evidence of occult GI bleeding, consider hematology evaluation.   I have discussed the risks, alternatives, benefits with regards to but not limited to the risk of reaction to medication, bleeding, infection, perforation and the patient is agreeable to proceed. Written consent to be obtained.

## 2011-07-27 NOTE — Patient Instructions (Signed)
When you develop constipation, please stop the dicyclomine for a few days until your bowels are moving again. Since her constipation appears to be very rare, you can use Dulcolax pills or suppositories, directions as per box. We have scheduled you for a colonoscopy to further evaluate her chronic anemia. Please see separate instructions.  Constipation in Adults Constipation is having fewer than 2 bowel movements per week. Usually, the stools are hard. As we grow older, constipation is more common. If you try to fix constipation with laxatives, the problem may get worse. This is because laxatives taken over a long period of time make the colon muscles weaker. A low-fiber diet, not taking in enough fluids, and taking some medicines may make these problems worse. MEDICATIONS THAT MAY CAUSE CONSTIPATION  Water pills (diuretics).  Calcium channel blockers (used to control blood pressure and for the heart).   Certain pain medicines (narcotics).   Anticholinergics.  Anti-inflammatory agents.   Antacids that contain aluminum.   DISEASES THAT CONTRIBUTE TO CONSTIPATION  Diabetes.  Parkinson's disease.   Dementia.   Stroke.  Depression.   Illnesses that cause problems with salt and water metabolism.   HOME CARE INSTRUCTIONS  Constipation is usually best cared for without medicines. Increasing dietary fiber and eating more fruits and vegetables is the best way to manage constipation.   Slowly increase fiber intake to 25 to 38 grams per day. Whole grains, fruits, vegetables, and legumes are good sources of fiber. A dietitian can further help you incorporate high-fiber foods into your diet.   Drink enough water and fluids to keep your urine clear or pale yellow.   A fiber supplement may be added to your diet if you cannot get enough fiber from foods.   Increasing your activities also helps improve regularity.   Suppositories, as suggested by your caregiver, will also help. If you are  using antacids, such as aluminum or calcium containing products, it will be helpful to switch to products containing magnesium if your caregiver says it is okay.   If you have been given a liquid injection (enema) today, this is only a temporary measure. It should not be relied on for treatment of longstanding (chronic) constipation.   Stronger measures, such as magnesium sulfate, should be avoided if possible. This may cause uncontrollable diarrhea. Using magnesium sulfate may not allow you time to make it to the bathroom.  SEEK IMMEDIATE MEDICAL CARE IF:  There is bright red blood in the stool.   The constipation stays for more than 4 days.   There is belly (abdominal) or rectal pain.   You do not seem to be getting better.   You have any questions or concerns.  MAKE SURE YOU:  Understand these instructions.   Will watch your condition.   Will get help right away if you are not doing well or get worse.  Document Released: 07/15/2004 Document Re-Released: 01/11/2010 [facility] Patient Information 2011 [place], [place].  Diet for GERD or PUD Nutrition therapy can help ease the discomfort of gastroesophageal reflux disease (GERD) and peptic ulcer disease (PUD).  HOME CARE INSTRUCTIONS  Eat your meals slowly, in a relaxed setting.   Eat 5 to 6 small meals per day.   If a food causes distress, stop eating it for a period of time.  FOODS TO AVOID:  Coffee, regular or decaffeinated.  Cola beverages, regular or low calorie.   Tea, regular or decaffeinated.   Pepper.   Cocoa.   High fat foods including  meats.   Butter, margarine, hydrogenated oil (trans fats).  Peppermint or spearmint (if you have GERD).   Fruits and vegetables as tolerated.   Alcoholic beverages.   Nicotine (smoking or chewing). This is one of the most potent stimulants to acid production in the gastrointestinal tract.   Any food that seems to aggravate your condition.   If you have questions  regarding your diet, call your caregiver's office or a registered dietitian. OTHER TIPS IF YOU HAVE GERD:  Lying flat may make symptoms worse. Keep the head of your bed raised 6 to 9 inches by using a foam wedge or blocks under the legs of the bed.   Do not lay down until 3 hours after eating a meal.   Daily physical activity may help reduce symptoms.  MAKE SURE YOU:   Understand these instructions.   Will watch your condition.   Will get help right away if you are not doing well or get worse.  Document Released: 10/17/2005 Document Re-Released: 03/05/2009 [facility] Patient Information 2011 [place], [place].

## 2011-07-27 NOTE — Progress Notes (Signed)
Primary Care Physician:  Syliva Overman, MD, MD  Primary Gastroenterologist:  Jonette Eva, MD   Chief Complaint  Patient presents with  . Anemia    HPI:  Laura [name] is a 60 y.o. female here to discuss possibility of colonoscopy. I saw her back in August with complaints of recurrent epigastric burning type pain after eating mushrooms and onions. She has a history of chronic intermittent epigastric pain. Has been evaluated at [facility] Dr. Alycia Rossetti. Earlier this year she had a normal esophageal manometry, gastric emptying study, hydrogen breath test. PH study showed increased acid reflux 3.8% of the time pH less than 4 but no correlation with symptoms. It was felt that her symptoms were irritable bowel variant. EGD in March of 2011 showed chronic gastritis.  Complaining of constipation this week. Plans to get Dulcolax suppository. Constipation is rare. She did not feel MiraLax helped in the past. No melena, brbpr. Burning in epigastrium. Cannot eat pizza, mushroom/onions. No heartburn, dysphagia, odynophagia. Heme negative in May by Dr. Lodema Hong. She has a chronic anemia for several years. Her iron, ferritin, B12, folate were okay. She is status post hysterectomy. Denies blood donation, nosebleeds.  Current Outpatient Prescriptions  Medication Sig Dispense Refill  . dicyclomine (BENTYL) 10 MG capsule Take one tab four times daily  120 capsule  4  . escitalopram (LEXAPRO) 5 MG tablet Take 5 mg by mouth daily. Take one tablet by mouth once a day       . imipramine (TOFRANIL) 25 MG tablet Take 1 tablet (25 mg total) by mouth at bedtime.  30 tablet  4  . lovastatin (MEVACOR) 40 MG tablet Take two tabs by mouth at bedtime for cholesterol   60 tablet  4  . pantoprazole (PROTONIX) 40 MG tablet Take 1 tablet (40 mg total) by mouth 2 (two) times daily.  60 tablet  4  . SYNTHROID 100 MCG tablet Take 100 mcg by mouth daily.       Marland Kitchen tolterodine (DETROL LA) 4 MG 24 hr capsule Take 1 capsule (4 mg total) by  mouth daily.  30 capsule  2  . valsartan-hydrochlorothiazide (DIOVAN HCT) 160-12.5 MG per tablet Take 1 tablet by mouth daily. Take one tablet by mouth once a day  30 tablet  4  . meloxicam (MOBIC) 15 MG tablet Take one tablet once daily for 10 days, then as needed , maximum 2 tablets per month  30 tablet  0  . predniSONE (STERAPRED UNI-PAK) 5 MG TABS Take 1 tablet (5 mg total) by mouth as directed.  21 tablet  0   Current Facility-Administered Medications  Medication Dose Route Frequency Provider Last Rate Last Dose  . DISCONTD: Influenza (>/= 3 years) inactive virus vaccine (FLVIRIN/FLUZONE) injection SUSP 0.5 mL  0.5 mL Intramuscular Once Syliva Overman, MD        Allergies as of 07/27/2011 - Review Complete 07/27/2011  Allergen Reaction Noted  . Dilaudid (hydromorphone hcl)  03/01/2011  . Nsaids  01/13/2010    Past Medical History  Diagnosis Date  . GERD (gastroesophageal reflux disease)   . Gastritis   . Hypertension   . Hyperlipidemia   . Anemia   . Cystitis   . Osteopenia   . Shoulder pain   . Hypothyroidism   . Hashimoto's thyroiditis     Hx   . Urinary incontinence     Past Surgical History  Procedure Date  . Total abdominal hysterectomy 1994  . Umbilical hernia repair   . Breast  excisional biopsy 2010    Excisional biopsy of benign left breast mass -lopoma   . Resection of left lobe of thyroid   . Bilateral tubal ligation   . Right carpal tunnel release   . Urethral dilation for stenosis 2009  . Rt. knee athroscopy 2004  . Colonscopy 2005    Dr. Katrinka Blazing  . Esophagogastroduodenoscopy 12/2009    chronic gastritis    Family History  Problem Relation Age of Onset  . Colon cancer Neg Hx     History   Social History  . Marital Status: Married    Spouse Name: N/A    Number of Children: 3  . Years of Education: N/A   Occupational History  . 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 Smoking status: Former Smoker -- 0.5  packs/day    Types: Cigarettes  . Smokeless tobacco: Not on file  . Alcohol Use: No  . Drug Use: No  . Sexually Active: Not on file   Other Topics Concern  . Not on file   Social History Narrative  . No narrative on file      ROS:  General: Negative for anorexia, weight loss, fever, chills. Complains of fatigue. Eyes: Negative for vision changes.  ENT: Negative for hoarseness, difficulty swallowing , nasal congestion. CV: Negative for chest pain, angina, palpitations, dyspnea on exertion, peripheral edema.  Respiratory: Negative for dyspnea at rest, dyspnea on exertion, cough, sputum, wheezing.  GI: See history of present illness. GU:  Negative for dysuria, hematuria, urinary incontinence, urinary frequency, nocturnal urination.  MS: Negative for joint pain, low back pain.  Derm: Negative for rash or itching.  Neuro: Negative for weakness, abnormal sensation, seizure, frequent headaches, memory loss, confusion.  Psych: Negative for anxiety, depression, suicidal ideation, hallucinations.  Endo: Negative for unusual weight change.  Heme: Negative for bruising or bleeding. Allergy: Negative for rash or hives.    Physical Examination:  BP 128/81  Pulse 79  Temp(Src) 97.5 F (36.4 C) (Temporal)  Ht 5\' 1"  (1.549 m)  Wt 168 lb 6.4 oz (76.386 kg)  BMI 31.82 kg/m2   General: Well-nourished, well-developed in no acute distress.  Head: Normocephalic, atraumatic.   Eyes: Conjunctiva pink, no icterus. Mouth: Oropharyngeal mucosa moist and pink , no lesions erythema or exudate. Neck: Supple without thyromegaly, masses, or lymphadenopathy.  Lungs: Clear to auscultation bilaterally.  Heart: Regular rate and rhythm, no murmurs rubs or gallops.  Abdomen: Bowel sounds are normal, mild epigastric tenderness, nondistended, no hepatosplenomegaly or masses, no abdominal bruits or    hernia , no rebound or guarding.   Rectal: Deferred to time of colonoscopy. Extremities: No lower  extremity edema. No clubbing or deformities.  Neuro: Alert and oriented x 4 , grossly normal neurologically.  Skin: Warm and dry, no rash or jaundice.   Psych: Alert and cooperative, normal mood and affect.  Labs: Lab Results  Component Value Date   WBC 7.1 07/19/2011   HGB 10.3* 07/19/2011   HCT 32.8* 07/19/2011   MCV 87.2 07/19/2011   PLT 263 07/19/2011   Lab Results  Component Value Date   CREATININE 1.02 07/14/2011   BUN 17 07/14/2011   NA 139 07/14/2011   K 4.1 07/14/2011   CL 101 07/14/2011   CO2 30 07/14/2011   Lab Results  Component Value Date   ALT 15 07/14/2011   AST 20 07/14/2011   ALKPHOS 66 07/14/2011   BILITOT 0.5 07/14/2011  Lab Results  Component Value Date   IRON 63 07/14/2011   TIBC 244* 07/14/2011   FERRITIN 166 07/14/2011   Lab Results  Component Value Date   VITAMINB12 1396* 07/14/2011   Lab Results  Component Value Date   FOLATE 19.6 07/14/2011     Imaging Studies: No results found.

## 2011-07-29 ENCOUNTER — Encounter: Payer: Self-pay | Admitting: Gastroenterology

## 2011-08-08 ENCOUNTER — Telehealth: Payer: Self-pay | Admitting: Family Medicine

## 2011-08-09 ENCOUNTER — Other Ambulatory Visit: Payer: Self-pay | Admitting: Family Medicine

## 2011-08-09 ENCOUNTER — Telehealth: Payer: Self-pay | Admitting: Family Medicine

## 2011-08-09 MED ORDER — FLUTICASONE PROPIONATE 50 MCG/ACT NA SUSP: 2.0000 | Freq: Every day | NASAL | Status: DC

## 2011-08-09 NOTE — Telephone Encounter (Signed)
Left message to call back  

## 2011-08-09 NOTE — Telephone Encounter (Signed)
Patient will need to schedule appointment if she feels she needs antibiotic

## 2011-08-09 NOTE — Telephone Encounter (Signed)
Will try something else otc and if she gets worse she will call back

## 2011-08-09 NOTE — Telephone Encounter (Signed)
Message copied by Elesa Hacker on Tue Aug 09, 2011  4:39 PM ------      Message from: Syliva Overman MD E      Created: Tue Aug 09, 2011 12:14 PM       pls let pt know med sent in for sinus

## 2011-08-10 NOTE — Telephone Encounter (Signed)
Patient aware.

## 2011-08-12 ENCOUNTER — Other Ambulatory Visit: Payer: Self-pay | Admitting: Family Medicine

## 2011-08-15 ENCOUNTER — Telehealth: Payer: Self-pay

## 2011-08-15 NOTE — Telephone Encounter (Signed)
This has been sent in today  

## 2011-08-25 MED ORDER — SODIUM CHLORIDE 0.45 % IV SOLN
Freq: Once | INTRAVENOUS | Status: AC
  Administered 2011-08-26: 08:00:00 via INTRAVENOUS

## 2011-08-26 ENCOUNTER — Ambulatory Visit (HOSPITAL_COMMUNITY)
Admission: RE | Admit: 2011-08-26 | Discharge: 2011-08-26 | Disposition: A | Discharge status: Home / Self Care | Payer: BC Managed Care – PPO | Source: Ambulatory Visit | Attending: Gastroenterology | Admitting: Gastroenterology

## 2011-08-26 ENCOUNTER — Encounter (HOSPITAL_COMMUNITY)
Admission: RE | Disposition: A | Discharge status: Home / Self Care | Payer: Self-pay | Source: Ambulatory Visit | Attending: Gastroenterology

## 2011-08-26 ENCOUNTER — Encounter (HOSPITAL_COMMUNITY): Payer: Self-pay

## 2011-08-26 DIAGNOSIS — K5909 Other constipation: Secondary | ICD-10-CM | POA: Insufficient documentation

## 2011-08-26 DIAGNOSIS — D649 Anemia, unspecified: Secondary | ICD-10-CM

## 2011-08-26 DIAGNOSIS — K5900 Constipation, unspecified: Secondary | ICD-10-CM

## 2011-08-26 DIAGNOSIS — K648 Other hemorrhoids: Secondary | ICD-10-CM | POA: Insufficient documentation

## 2011-08-26 HISTORY — DX: Major depressive disorder, single episode, unspecified: F32.9

## 2011-08-26 HISTORY — PX: COLONOSCOPY: SHX5424

## 2011-08-26 HISTORY — DX: Anxiety disorder, unspecified: F41.9

## 2011-08-26 HISTORY — DX: Depression, unspecified: F32.A

## 2011-08-26 SURGERY — COLONOSCOPY
Anesthesia: Moderate Sedation

## 2011-08-26 MED ORDER — DOCUSATE SODIUM 100 MG PO CAPS: ORAL_CAPSULE | ORAL | Status: DC

## 2011-08-26 MED ORDER — MIDAZOLAM HCL 5 MG/5ML IJ SOLN
INTRAMUSCULAR | Status: DC | PRN
  Administered 2011-08-26 (×2): 2 mg via INTRAVENOUS

## 2011-08-26 MED ORDER — MIDAZOLAM HCL 5 MG/5ML IJ SOLN
INTRAMUSCULAR | Status: AC
  Filled 2011-08-26: qty 10

## 2011-08-26 MED ORDER — POLYETHYLENE GLYCOL 3350 17 G PO PACK: PACK | ORAL | Status: DC

## 2011-08-26 MED ORDER — MEPERIDINE HCL 100 MG/ML IJ SOLN
INTRAMUSCULAR | Status: AC
  Filled 2011-08-26: qty 1

## 2011-08-26 MED ORDER — MEPERIDINE HCL 100 MG/ML IJ SOLN
INTRAMUSCULAR | Status: DC | PRN
  Administered 2011-08-26: 50 mg
  Administered 2011-08-26: 25 mg via INTRAVENOUS

## 2011-08-26 NOTE — Interval H&P Note (Signed)
History and Physical Interval Note:   08/26/2011   8:07 AM   Laura [name]  has presented today for surgery, with the diagnosis of CHRONIC ANEMIA , CONSTIPATION  The various methods of treatment have been discussed with the patient and family. After consideration of risks, benefits and other options for treatment, the patient has consented to  Procedure(s): COLONOSCOPY as a surgical intervention .  The patients' history has been reviewed, patient examined, no change in status, stable for surgery.  I have reviewed the patients' chart and labs.  Questions were answered to the patient's satisfaction.     Jonette Eva  MD   THE PATIENT WAS EXAMINED AND THERE IS NO CHANGE IN THE PATIENT'S CONDITION SINCE THE ORIGINAL H&P WAS COMPLETED.

## 2011-08-26 NOTE — H&P (Signed)
BP Pulse Temp(Src) Ht Wt BMI    128/81  79  97.5 F (36.4 C) (Temporal)  5\' 1"  (1.549 m)  168 lb 6.4 oz (76.386 kg)  31.82 kg/m2       Progress Notes     Tana Coast, PA  07/27/2011  8:41 AM  Signed Primary Care Physician:  Syliva Overman, MD, MD   Primary Gastroenterologist:  Jonette Eva, MD      Chief Complaint   Patient presents with   .  Anemia      HPI:  Laura [name] is a 60 y.o. female here to discuss possibility of colonoscopy. I saw her back in August with complaints of recurrent epigastric burning type pain after eating mushrooms and onions. She has a history of chronic intermittent epigastric pain. Has been evaluated at [facility] Dr. Alycia Rossetti. Earlier this year she had a normal esophageal manometry, gastric emptying study, hydrogen breath test. PH study showed increased acid reflux 3.8% of the time pH less than 4 but no correlation with symptoms. It was felt that her symptoms were irritable bowel variant. EGD in March of 2011 showed chronic gastritis.   Complaining of constipation this week. Plans to get Dulcolax suppository. Constipation is rare. She did not feel MiraLax helped in the past. No melena, brbpr. Burning in epigastrium. Cannot eat pizza, mushroom/onions. No heartburn, dysphagia, odynophagia. Heme negative in May by Dr. Lodema Hong. She has a chronic anemia for several years. Her iron, ferritin, B12, folate were okay. She is status post hysterectomy. Denies blood donation, nosebleeds.    Current Outpatient Prescriptions   Medication  Sig  Dispense  Refill   .  dicyclomine (BENTYL) 10 MG capsule  Take one tab four times daily   120 capsule   4   .  escitalopram (LEXAPRO) 5 MG tablet  Take 5 mg by mouth daily. Take one tablet by mouth once a day          .  imipramine (TOFRANIL) 25 MG tablet  Take 1 tablet (25 mg total) by mouth at bedtime.   30 tablet   4   .  lovastatin (MEVACOR) 40 MG tablet  Take two tabs by mouth at bedtime for cholesterol     60 tablet    4   .  pantoprazole (PROTONIX) 40 MG tablet  Take 1 tablet (40 mg total) by mouth 2 (two) times daily.   60 tablet   4   .  SYNTHROID 100 MCG tablet  Take 100 mcg by mouth daily.          Marland Kitchen  tolterodine (DETROL LA) 4 MG 24 hr capsule  Take 1 capsule (4 mg total) by mouth daily.   30 capsule   2   .  valsartan-hydrochlorothiazide (DIOVAN HCT) 160-12.5 MG per tablet  Take 1 tablet by mouth daily. Take one tablet by mouth once a day   30 tablet   4   .  meloxicam (MOBIC) 15 MG tablet  Take one tablet once daily for 10 days, then as needed , maximum 2 tablets per month   30 tablet   0   .  predniSONE (STERAPRED UNI-PAK) 5 MG TABS  Take 1 tablet (5 mg total) by mouth as directed.   21 tablet   0       Current Facility-Administered Medications   Medication  Dose  Route  Frequency  Provider  Last Rate  Last Dose   .  DISCONTD: Influenza (>/= 3 years) inactive virus vaccine (FLVIRIN/FLUZONE) injection SUSP 0.5 mL   0.5 mL  Intramuscular  Once  Syliva Overman, MD             Allergies as of 07/27/2011 - Review Complete 07/27/2011   Allergen  Reaction  Noted   .  Dilaudid (hydromorphone hcl)    03/01/2011   .  Nsaids    01/13/2010       Past Medical History   Diagnosis  Date   .  GERD (gastroesophageal reflux disease)     .  Gastritis     .  Hypertension     .  Hyperlipidemia     .  Anemia     .  Cystitis     .  Osteopenia     .  Shoulder pain     .  Hypothyroidism     .  Hashimoto's thyroiditis         Hx    .  Urinary incontinence         Past Surgical History   Procedure  Date   .  Total abdominal hysterectomy  1994   .  Umbilical hernia repair     .  Breast excisional biopsy  2010       Excisional biopsy of benign left breast mass -lopoma    .  Resection of left lobe of thyroid     .  Bilateral tubal ligation     .  Right carpal tunnel release     .  Urethral dilation for stenosis  2009   .  Rt. knee athroscopy  2004   .  Colonscopy  2005       Dr. Katrinka Blazing   .   Esophagogastroduodenoscopy  12/2009       chronic gastritis       Family History   Problem  Relation  Age of Onset   .  Colon cancer  Neg Hx         History       Social History   .  Marital Status:  Married       Spouse Name:  N/A       Number of Children:  3   .  Years of Education:  N/A       Occupational History   .  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  Smoking status:  Former Smoker -- 0.5 packs/day       Types:  Cigarettes   .  Smokeless tobacco:  Not on file   .  Alcohol Use:  No   .  Drug Use:  No   .  Sexually Active:  Not on file       Other Topics  Concern   .  Not on file       Social History Narrative   .  No narrative on file        ROS:   General: Negative for anorexia, weight loss, fever, chills. Complains of fatigue. Eyes: Negative for vision changes.   ENT: Negative for hoarseness, difficulty swallowing , nasal congestion. CV: Negative for chest pain, angina, palpitations, dyspnea on exertion, peripheral edema.   Respiratory: Negative for dyspnea at rest, dyspnea on exertion, cough, sputum, wheezing.   GI: See history of present illness. GU:  Negative for dysuria, hematuria, urinary incontinence, urinary frequency, nocturnal urination.  MS: Negative for joint pain, low back pain.   Derm: Negative for rash or itching.   Neuro: Negative for weakness, abnormal sensation, seizure, frequent headaches, memory loss, confusion.   Psych: Negative for anxiety, depression, suicidal ideation, hallucinations.   Endo: Negative for unusual weight change.   Heme: Negative for bruising or bleeding. Allergy: Negative for rash or hives.     Physical Examination:   BP 128/81  Pulse 79  Temp(Src) 97.5 F (36.4 C) (Temporal)  Ht 5\' 1"  (1.549 m)  Wt 168 lb 6.4 oz (76.386 kg)  BMI 31.82 kg/m2    General: Well-nourished, well-developed in no acute distress.   Head: Normocephalic, atraumatic.    Eyes:  Conjunctiva pink, no icterus. Mouth: Oropharyngeal mucosa moist and pink , no lesions erythema or exudate. Neck: Supple without thyromegaly, masses, or lymphadenopathy.   Lungs: Clear to auscultation bilaterally.   Heart: Regular rate and rhythm, no murmurs rubs or gallops.   Abdomen: Bowel sounds are normal, mild epigastric tenderness, nondistended, no hepatosplenomegaly or masses, no abdominal bruits or    hernia , no rebound or guarding.    Rectal: Deferred to time of colonoscopy. Extremities: No lower extremity edema. No clubbing or deformities.   Neuro: Alert and oriented x 4 , grossly normal neurologically.   Skin: Warm and dry, no rash or jaundice.    Psych: Alert and cooperative, normal mood and affect.   Labs: Lab Results   Component  Value  Date     WBC  7.1  07/19/2011     HGB  10.3*  07/19/2011     HCT  32.8*  07/19/2011     MCV  87.2  07/19/2011     PLT  263  07/19/2011    Lab Results   Component  Value  Date     CREATININE  1.02  07/14/2011     BUN  17  07/14/2011     NA  139  07/14/2011     K  4.1  07/14/2011     CL  101  07/14/2011     CO2  30  07/14/2011    Lab Results   Component  Value  Date     ALT  15  07/14/2011     AST  20  07/14/2011     ALKPHOS  66  07/14/2011     BILITOT  0.5  07/14/2011    Lab Results   Component  Value  Date     IRON  63  07/14/2011     TIBC  244*  07/14/2011     FERRITIN  166  07/14/2011    Lab Results   Component  Value  Date     VITAMINB12  1396*  07/14/2011    Lab Results   Component  Value  Date     FOLATE  19.6  07/14/2011        Imaging Studies: No results found.  Glendora Score  07/27/2011  8:45 AM  Signed Cc to PCP     ANEMIA - Tana Coast, PA  07/27/2011  8:41 AM  Addendum Chronic anemia. Hemoccult negative in May. Etiology unknown. No evidence of iron deficiency. As discussed with Dr. Darrick Penna previously, plan for colonoscopy at this point. If colonoscopy is unrevealing, may consider rechecking ifobt. If no evidence of  occult GI bleeding, consider hematology evaluation.    I have discussed the risks, alternatives, benefits with regards to but not limited to the risk of reaction  to medication, bleeding, infection, perforation and the patient is agreeable to proceed. Written consent to be obtained.     Previous Version  DYSPEPSIA, NONULCERATIVE - Tana Coast, [place]  07/27/2011  8:40 AM  Signed Chronic abdominal pain, possibly a variant of irritable bowel. Extensive workup as outlined in the history of present illness. Diet provided to the patient. Continue the imipramine and dicyclomine. She was advised to hold dicyclomine when/if she develops constipation until her bowels are moving more regularly. Would prefer her to avoid NSAIDs but if required she should be taking PPI with them.  CONSTIPATION - Tana Coast, PA  07/27/2011  8:40 AM  Signed Hold dicyclomine when constipated. Constipation appears to be rare, okay to use dulcolax

## 2011-08-29 ENCOUNTER — Other Ambulatory Visit: Payer: Self-pay | Admitting: Family Medicine

## 2011-08-30 ENCOUNTER — Encounter: Payer: Self-pay | Admitting: Gastroenterology

## 2011-08-31 ENCOUNTER — Encounter (HOSPITAL_COMMUNITY): Payer: Self-pay | Admitting: Gastroenterology

## 2011-09-02 ENCOUNTER — Ambulatory Visit (HOSPITAL_COMMUNITY)
Admission: RE | Admit: 2011-09-02 | Discharge: 2011-09-02 | Disposition: A | Discharge status: Home / Self Care | Payer: BC Managed Care – PPO | Source: Ambulatory Visit | Attending: Family Medicine | Admitting: Family Medicine

## 2011-09-02 DIAGNOSIS — Z1231 Encounter for screening mammogram for malignant neoplasm of breast: Secondary | ICD-10-CM | POA: Insufficient documentation

## 2011-09-02 DIAGNOSIS — Z139 Encounter for screening, unspecified: Secondary | ICD-10-CM

## 2011-09-02 IMAGING — MG MM DIGITAL SCREENING BILAT W/ CAD
4 series · 4 of 4 positions shown · non-contrast
Comparison: none

DG SCREEN MAMMOGRAM BILATERAL
Bilateral CC and MLO view(s) were taken.

DIGITAL SCREENING MAMMOGRAM WITH CAD:
There are scattered fibroglandular densities.  No masses or malignant type calcifications are 
identified.  Compared with prior studies.
Images were processed with CAD.

[L CC]
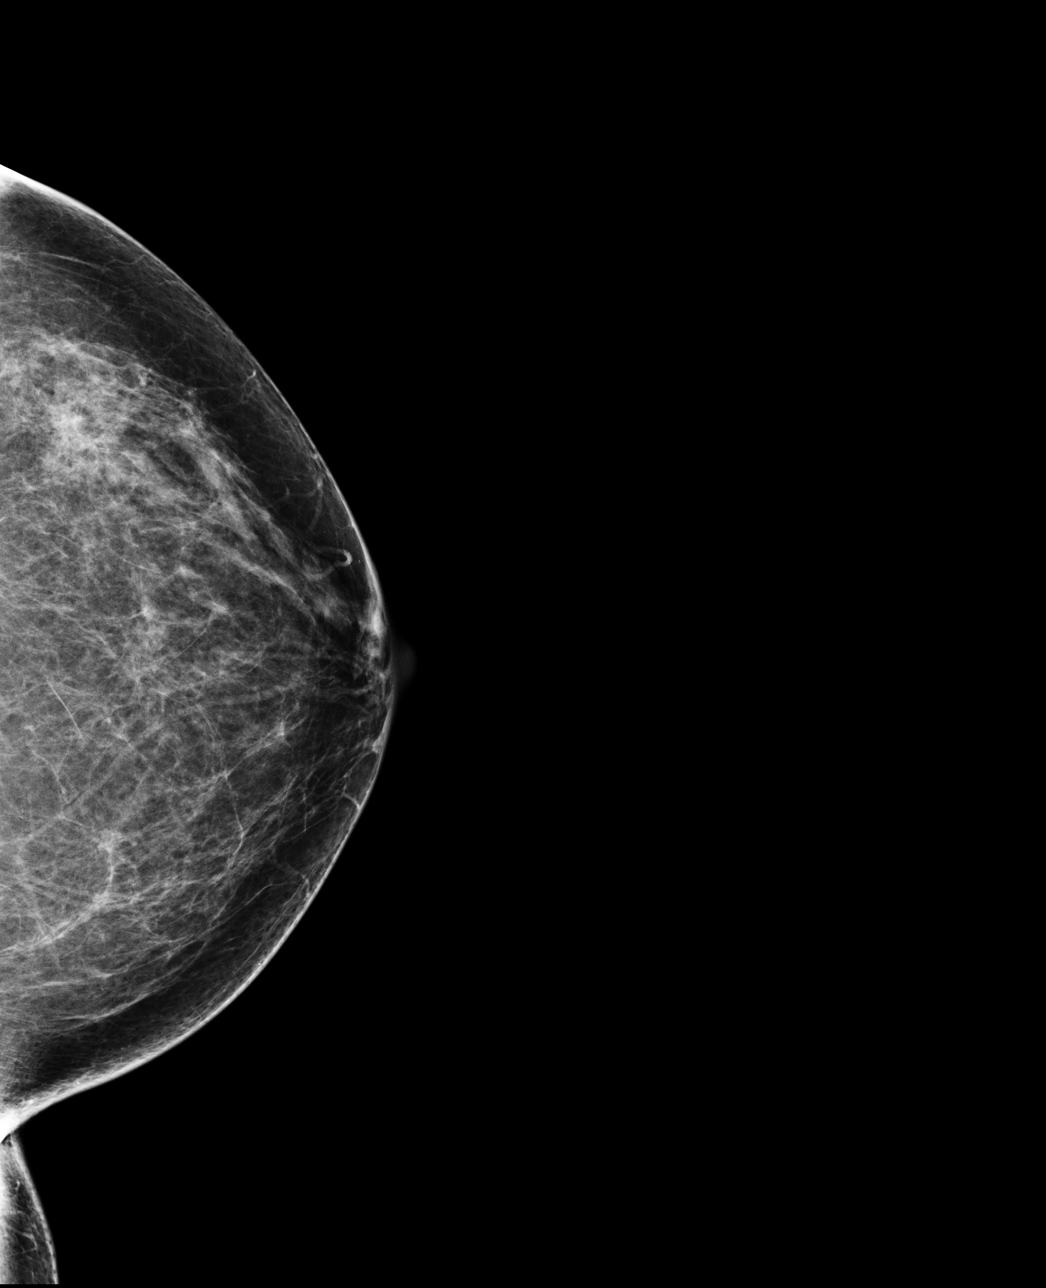

[L MLO]
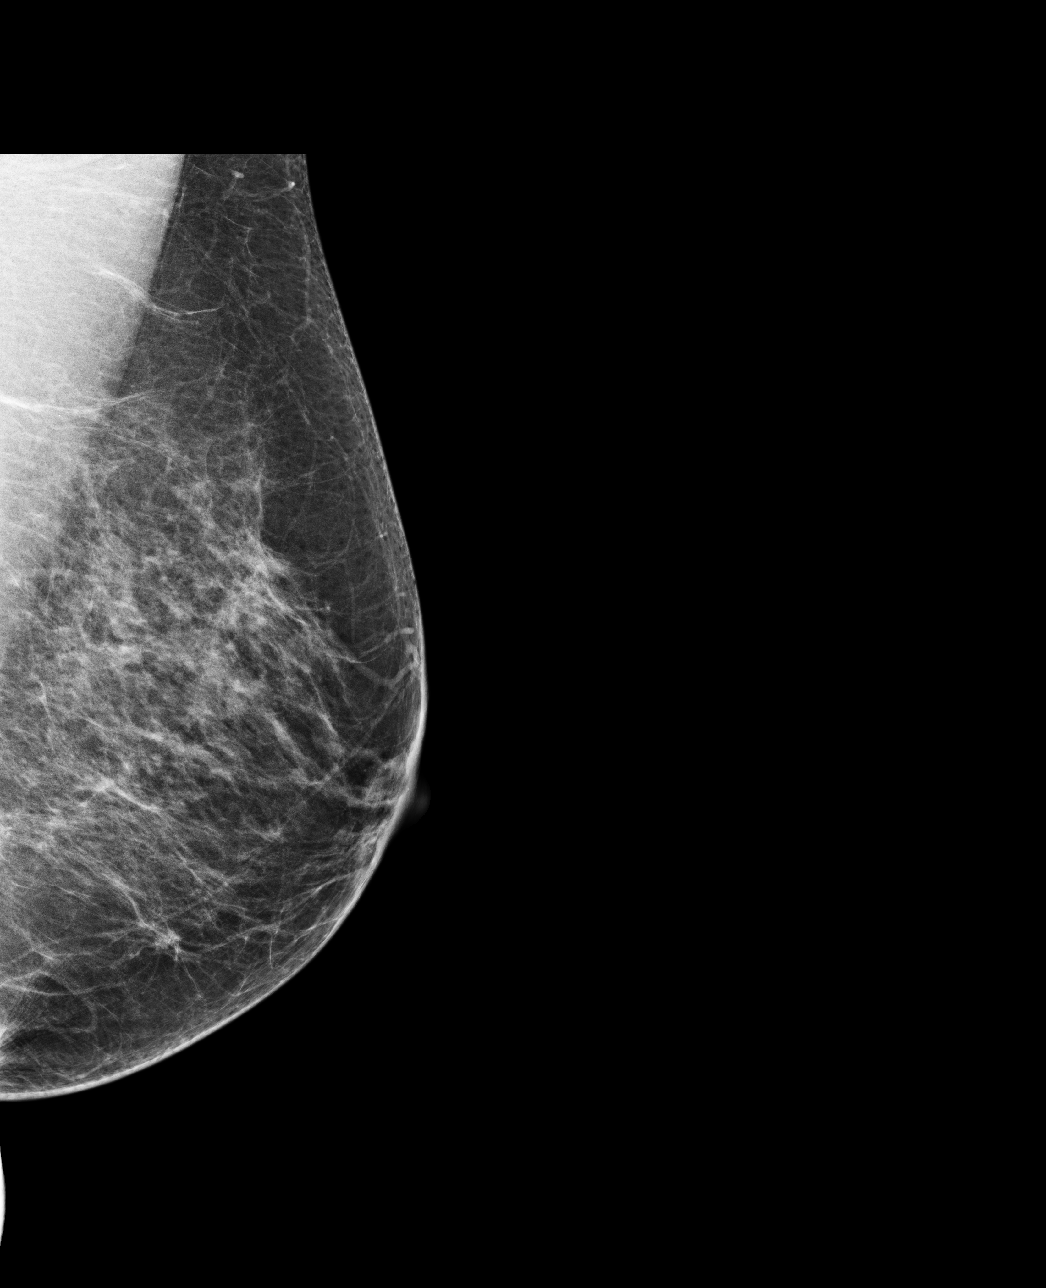

[R CC]
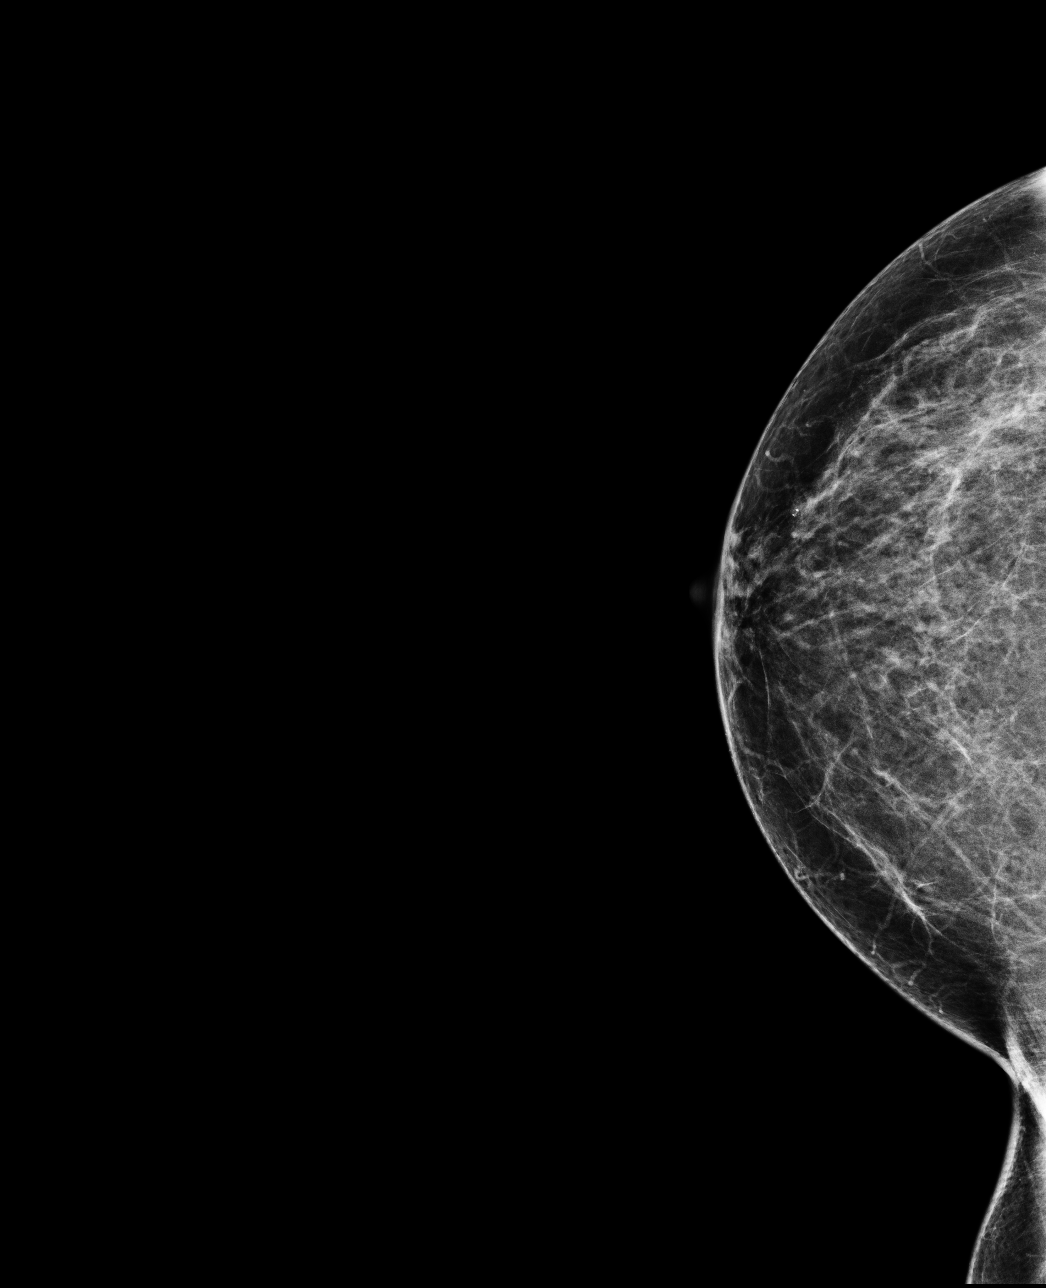

[R MLO]
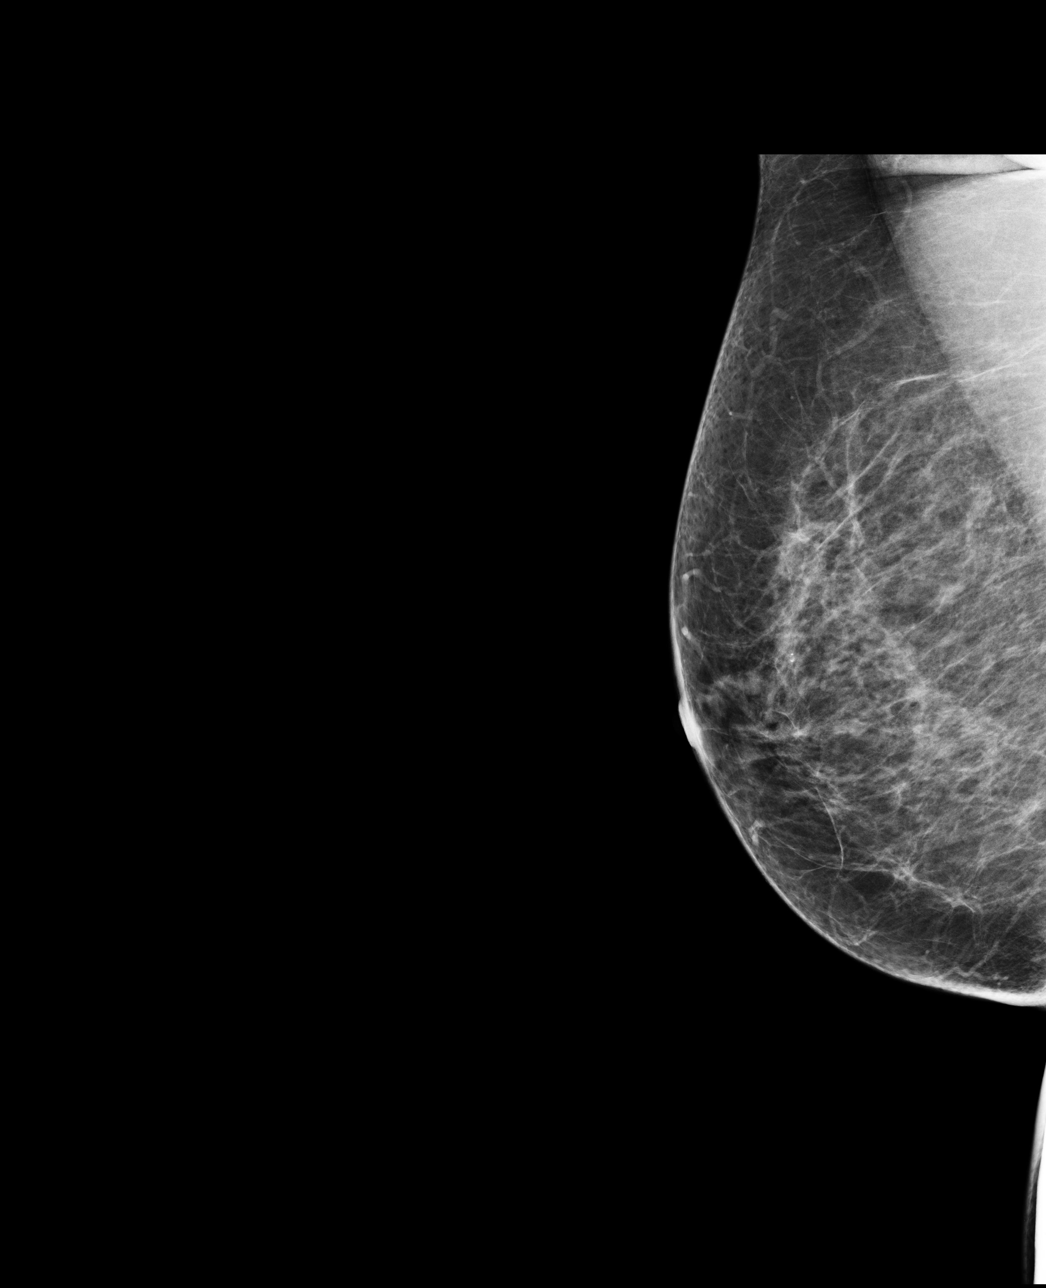

[4 of 4 positions shown; findings below may reference images not displayed]

IMPRESSION: No specific mammographic evidence of malignancy.  Next screening mammogram is recommended in one 
year.

A result letter of this screening mammogram will be mailed directly to the patient.

ASSESSMENT: Negative - BI-RADS 1

Screening mammogram in 1 year.
,

## 2011-09-17 ENCOUNTER — Other Ambulatory Visit: Payer: Self-pay | Admitting: Family Medicine

## 2011-09-19 ENCOUNTER — Telehealth: Payer: Self-pay | Admitting: Family Medicine

## 2011-09-19 NOTE — Telephone Encounter (Signed)
Already filled

## 2011-09-26 NOTE — Progress Notes (Signed)
TCS OCT 2012 MOD IH COLACE TID AND MIRALAX OPV IN 3 MOS CONSIDER AMITIZA

## 2011-11-15 ENCOUNTER — Other Ambulatory Visit: Payer: Self-pay | Admitting: Family Medicine

## 2011-11-16 ENCOUNTER — Encounter: Payer: Self-pay | Admitting: Family Medicine

## 2011-11-17 ENCOUNTER — Ambulatory Visit (INDEPENDENT_AMBULATORY_CARE_PROVIDER_SITE_OTHER): Payer: BC Managed Care – PPO | Admitting: Family Medicine

## 2011-11-17 ENCOUNTER — Encounter: Payer: Self-pay | Admitting: Family Medicine

## 2011-11-17 VITALS — BP 120/80 | HR 78 | Resp 16 | Ht 61.0 in | Wt 168.0 lb

## 2011-11-17 DIAGNOSIS — I10 Essential (primary) hypertension: Secondary | ICD-10-CM

## 2011-11-17 DIAGNOSIS — G5600 Carpal tunnel syndrome, unspecified upper limb: Secondary | ICD-10-CM

## 2011-11-17 DIAGNOSIS — R32 Unspecified urinary incontinence: Secondary | ICD-10-CM

## 2011-11-17 DIAGNOSIS — M949 Disorder of cartilage, unspecified: Secondary | ICD-10-CM

## 2011-11-17 DIAGNOSIS — K3189 Other diseases of stomach and duodenum: Secondary | ICD-10-CM

## 2011-11-17 DIAGNOSIS — R1013 Epigastric pain: Secondary | ICD-10-CM

## 2011-11-17 DIAGNOSIS — K219 Gastro-esophageal reflux disease without esophagitis: Secondary | ICD-10-CM

## 2011-11-17 DIAGNOSIS — T7840XA Allergy, unspecified, initial encounter: Secondary | ICD-10-CM

## 2011-11-17 DIAGNOSIS — M899 Disorder of bone, unspecified: Secondary | ICD-10-CM

## 2011-11-17 DIAGNOSIS — E785 Hyperlipidemia, unspecified: Secondary | ICD-10-CM

## 2011-11-17 MED ORDER — PREDNISONE (PAK) 5 MG PO TABS: 5.0000 mg | ORAL_TABLET | ORAL | Status: DC

## 2011-11-17 MED ORDER — METHYLPREDNISOLONE ACETATE PF 80 MG/ML IJ SUSP
80.0000 mg | Freq: Once | INTRAMUSCULAR | Status: AC
  Administered 2011-11-17: 80 mg via INTRAMUSCULAR

## 2011-11-17 NOTE — Assessment & Plan Note (Addendum)
Worsening pain and weakness plans to have surgery end of this year. Pred dose pack and depo medrol in the office

## 2011-11-17 NOTE — Assessment & Plan Note (Signed)
Controlled, no change in medication  

## 2011-11-17 NOTE — Patient Instructions (Signed)
F/U in end April.  Fasting lipid, cmp, and vit D in April before next visit  Depo medrol injection in officce today and prednisone has been prescribed.  Call if no better, you will need to return to hand specialiist  It is important that you exercise regularly at least 30 minutes 5 times a week. If you develop chest pain, have severe difficulty breathing, or feel very tired, stop exercising immediately and seek medical attention  A healthy diet is rich in fruit, vegetables and whole grains. Poultry fish, nuts and beans are a healthy choice for protein rather then red meat. A low sodium diet and drinking 64 ounces of water daily is generally recommended. Oils and sweet should be limited. Carbohydrates especially for those who are diabetic or overweight, should be limited to 34-45 gram per meal. It is important to eat on a regular schedule, at least 3 times daily. Snacks should be primarily fruits, vegetables or nuts.

## 2011-11-20 NOTE — Progress Notes (Signed)
  Subjective:    Patient ID: Laura [name], female    DOB: 09-21-1951, 61 y.o.   MRN: [id number]  HPI  The PT is here for follow up and re-evaluation of chronic medical conditions, medication management and review of any available recent lab and radiology data.  Preventive health is updated, specifically  Cancer screening and Immunization.   Questions or concerns regarding consultations or procedures which the PT has had in the interim are  addressed. The PT denies any adverse reactions to current medications since the last visit.  C/o increased bilateral hand pain, left worse than right with weakness. Plans on surgery at the end of this year. Had recent flare of abdominal pain related to food she had eaten     Review of Systems See HPI Denies recent fever or chills. Denies sinus pressure, nasal congestion, ear pain or sore throat. Denies chest congestion, productive cough or wheezing. Denies chest pains, palpitations and leg swelling Denies, nausea, vomiting,diarrhea or constipation.   Denies dysuria, frequency, hesitancy or incontinence.  Denies headaches, seizures, numbness, or tingling. Denies depression, anxiety or insomnia. Denies skin break down or rash.        Objective:   Physical Exam Patient alert and oriented and in no cardiopulmonary distress.  HEENT: No facial asymmetry, EOMI, no sinus tenderness,  oropharynx pink and moist.  Neck supple no adenopathy.  Chest: Clear to auscultation bilaterally.  CVS: S1, S2 no murmurs, no S3.  ABD: Soft non tender. Bowel sounds normal.  Ext: No edema  MS: Adequate ROM spine, shoulders, hips and knees.Positive tinel's sign in both hands  Skin: Intact, no ulcerations or rash noted.  Psych: Good eye contact, normal affect. Memory intact not anxious or depressed appearing.  CNS: CN 2-12 intact,  Decreased power in hands, left moreso than right     Assessment & Plan:

## 2011-11-20 NOTE — Assessment & Plan Note (Signed)
Controlled with medication, continue same 

## 2011-11-20 NOTE — Assessment & Plan Note (Signed)
Had recent flare related to mushrooms reportedly has recovered , but states she nearly sought help at a tertiary center

## 2011-11-20 NOTE — Assessment & Plan Note (Signed)
Controlled, no change in medication  

## 2011-11-21 ENCOUNTER — Other Ambulatory Visit: Payer: Self-pay

## 2011-11-21 DIAGNOSIS — R1013 Epigastric pain: Secondary | ICD-10-CM

## 2011-11-21 MED ORDER — IMIPRAMINE HCL 25 MG PO TABS: 25.0000 mg | ORAL_TABLET | Freq: Every day | ORAL | Status: DC

## 2011-11-22 ENCOUNTER — Other Ambulatory Visit: Payer: Self-pay | Admitting: Family Medicine

## 2011-12-19 ENCOUNTER — Encounter: Payer: Self-pay | Admitting: Family Medicine

## 2011-12-19 ENCOUNTER — Ambulatory Visit (INDEPENDENT_AMBULATORY_CARE_PROVIDER_SITE_OTHER): Payer: BC Managed Care – PPO | Admitting: Family Medicine

## 2011-12-19 VITALS — BP 130/76 | HR 79 | Resp 18 | Ht 61.0 in | Wt 175.0 lb

## 2011-12-19 DIAGNOSIS — K219 Gastro-esophageal reflux disease without esophagitis: Secondary | ICD-10-CM

## 2011-12-19 DIAGNOSIS — I10 Essential (primary) hypertension: Secondary | ICD-10-CM

## 2011-12-19 DIAGNOSIS — M25519 Pain in unspecified shoulder: Secondary | ICD-10-CM

## 2011-12-19 DIAGNOSIS — M25511 Pain in right shoulder: Secondary | ICD-10-CM

## 2011-12-19 MED ORDER — KETOROLAC TROMETHAMINE 60 MG/2ML IJ SOLN
60.0000 mg | Freq: Once | INTRAMUSCULAR | Status: AC
  Administered 2011-12-19: 60 mg via INTRAMUSCULAR

## 2011-12-19 MED ORDER — DICLOFENAC SODIUM 1 % TD GEL: TRANSDERMAL | Status: DC

## 2011-12-19 MED ORDER — METHYLPREDNISOLONE ACETATE 80 MG/ML IJ SUSP
80.0000 mg | Freq: Once | INTRAMUSCULAR | Status: AC
  Administered 2011-12-19: 80 mg via INTRAMUSCULAR

## 2011-12-19 NOTE — Progress Notes (Signed)
  Subjective:    Patient ID: Laura [name], female    DOB: 06/09/1951, 61 y.o.   MRN: [id number]  HPI 1 week h/o right shoulder pain, feels she pulled a muscle on the job, does not want to go to ortho or therapy about this just wants some relief. Has continued to work. No other concerns voiced  Review of Systems See HPI Denies recent fever or chills. Denies sinus pressure, nasal congestion, ear pain or sore throat. Denies chest congestion, productive cough or wheezing.  Denies headaches, seizures, numbness, or tingling. Denies depression, anxiety or insomnia. Denies skin break down or rash.        Objective:   Physical Exam Patient alert and oriented and in no cardiopulmonary distress.  HEENT: No facial asymmetry, EOMI, no sinus tenderness,  oropharynx pink and moist.  Neck supple no adenopathy.  Chest: Clear to auscultation bilaterally.  CVS: S1, S2 no murmurs, no S3.  ABD: Soft non tender. Bowel sounds normal.  Ext: No edema  MS: Adequate ROM spine,  hips and knees.Decreased ROM right shoulder, tender posteriorly  Skin: Intact, no ulcerations or rash noted.  Psych: Good eye contact, normal affect. Memory intact not anxious or depressed appearing.  CNS: CN 2-12 intact, power, tone and sensation normal throughout.        Assessment & Plan:

## 2011-12-19 NOTE — Assessment & Plan Note (Signed)
Controlled, no change in medication  

## 2011-12-19 NOTE — Assessment & Plan Note (Signed)
Acute increased pain x 1 week, anti inflammatories in office

## 2011-12-19 NOTE — Patient Instructions (Addendum)
F/U as before.  You are getting injections of toradol and depo medrol today for right shoulder pain  Med is sent in for pain, topical  Use only, ok to take tylenol tablet also

## 2011-12-20 ENCOUNTER — Telehealth: Payer: Self-pay | Admitting: Family Medicine

## 2011-12-20 NOTE — Telephone Encounter (Signed)
pls advise this is the only topical anti inflammatory prep, she can use tylenol 500mg  one twice daily instead She may find out the cost of the prep it is generic , may be affordable

## 2011-12-21 NOTE — Telephone Encounter (Signed)
Awaiting return call

## 2011-12-21 NOTE — Telephone Encounter (Signed)
Called and left message for pt to return call.  

## 2011-12-23 NOTE — Telephone Encounter (Signed)
Spoke with pt and she is aware and will followup with pharmacy.

## 2012-01-01 ENCOUNTER — Encounter (HOSPITAL_COMMUNITY): Payer: Self-pay | Admitting: *Deleted

## 2012-01-01 DIAGNOSIS — E785 Hyperlipidemia, unspecified: Secondary | ICD-10-CM | POA: Insufficient documentation

## 2012-01-01 DIAGNOSIS — K219 Gastro-esophageal reflux disease without esophagitis: Secondary | ICD-10-CM | POA: Insufficient documentation

## 2012-01-01 DIAGNOSIS — E669 Obesity, unspecified: Secondary | ICD-10-CM | POA: Insufficient documentation

## 2012-01-01 DIAGNOSIS — R112 Nausea with vomiting, unspecified: Secondary | ICD-10-CM | POA: Insufficient documentation

## 2012-01-01 DIAGNOSIS — R10819 Abdominal tenderness, unspecified site: Secondary | ICD-10-CM | POA: Insufficient documentation

## 2012-01-01 DIAGNOSIS — R197 Diarrhea, unspecified: Secondary | ICD-10-CM | POA: Insufficient documentation

## 2012-01-01 DIAGNOSIS — I10 Essential (primary) hypertension: Secondary | ICD-10-CM | POA: Insufficient documentation

## 2012-01-01 DIAGNOSIS — R109 Unspecified abdominal pain: Secondary | ICD-10-CM | POA: Insufficient documentation

## 2012-01-01 DIAGNOSIS — Z79899 Other long term (current) drug therapy: Secondary | ICD-10-CM | POA: Insufficient documentation

## 2012-01-01 DIAGNOSIS — F341 Dysthymic disorder: Secondary | ICD-10-CM | POA: Insufficient documentation

## 2012-01-01 DIAGNOSIS — E039 Hypothyroidism, unspecified: Secondary | ICD-10-CM | POA: Insufficient documentation

## 2012-01-01 NOTE — ED Notes (Signed)
Pt arrived to department via EMS.  Reporting generalized abdominal pain.  Pt has history of diverticulitis.

## 2012-01-02 ENCOUNTER — Emergency Department (HOSPITAL_COMMUNITY)
Admission: EM | Admit: 2012-01-02 | Discharge: 2012-01-02 | Disposition: A | Discharge status: Home / Self Care | Payer: BC Managed Care – PPO | Attending: Emergency Medicine | Admitting: Emergency Medicine

## 2012-01-02 ENCOUNTER — Emergency Department (HOSPITAL_COMMUNITY): Payer: BC Managed Care – PPO

## 2012-01-02 DIAGNOSIS — R197 Diarrhea, unspecified: Secondary | ICD-10-CM

## 2012-01-02 DIAGNOSIS — R1110 Vomiting, unspecified: Secondary | ICD-10-CM

## 2012-01-02 LAB — CBC
HCT: 34.9 % — ABNORMAL LOW (ref 36.0–46.0)
Hemoglobin: 11.3 g/dL — ABNORMAL LOW (ref 12.0–15.0)
MCH: 28 pg (ref 26.0–34.0)
MCHC: 32.4 g/dL (ref 30.0–36.0)
MCV: 86.6 fL (ref 78.0–100.0)
Platelets: 221 10*3/uL (ref 150–400)
RBC: 4.03 MIL/uL (ref 3.87–5.11)
RDW: 12.5 % (ref 11.5–15.5)
WBC: 7.2 10*3/uL (ref 4.0–10.5)

## 2012-01-02 LAB — LIPASE, BLOOD: Lipase: 12 U/L (ref 11–59)

## 2012-01-02 LAB — COMPREHENSIVE METABOLIC PANEL
ALT: 18 U/L (ref 0–35)
AST: 18 U/L (ref 0–37)
Albumin: 3.6 g/dL (ref 3.5–5.2)
Alkaline Phosphatase: 76 U/L (ref 39–117)
BUN: 20 mg/dL (ref 6–23)
CO2: 27 mEq/L (ref 19–32)
Calcium: 8.9 mg/dL (ref 8.4–10.5)
Chloride: 102 mEq/L (ref 96–112)
Creatinine, Ser: 0.8 mg/dL (ref 0.50–1.10)
GFR calc Af Amer: >90 mL/min (ref >90)
GFR calc non Af Amer: 78 mL/min — ABNORMAL LOW (ref >90)
Glucose, Bld: 119 mg/dL — ABNORMAL HIGH (ref 70–99)
Potassium: 3.4 mEq/L — ABNORMAL LOW (ref 3.5–5.1)
Sodium: 140 mEq/L (ref 135–145)
Total Bilirubin: 0.5 mg/dL (ref 0.3–1.2)
Total Protein: 7.4 g/dL (ref 6.0–8.3)

## 2012-01-02 IMAGING — CT CT ABD-PELV W/ CM
2 of 4 series · 16 of 46 positions shown, 18 images · IV contrast (Omnipaque 300)
Comparison: None

CLINICAL DATA: Umbilical and bilateral lower quadrant pain, nausea,
history diverticulitis, GERD, gastritis, hypertension, anemia,
hysterectomy, umbilical hernia repair

CT ABDOMEN AND PELVIS WITH CONTRAST
TECHNIQUE: Multidetector CT imaging of the abdomen and pelvis was
performed following the standard protocol during bolus
administration of intravenous contrast. Sagittal and coronal MPR
images reconstructed from axial data set.
Contrast: 100mL OMNIPAQUE IOHEXOL 300 MG/ML IV SOLN; Dilute oral
contrast.

[Series 2: abd_pel_with 5.0 b40f · axial · 0.65mm/px · z∈[+687,+1062]mm · 13 of 85 slices shown, 15 images]
[im 5/85  soft-tissue]
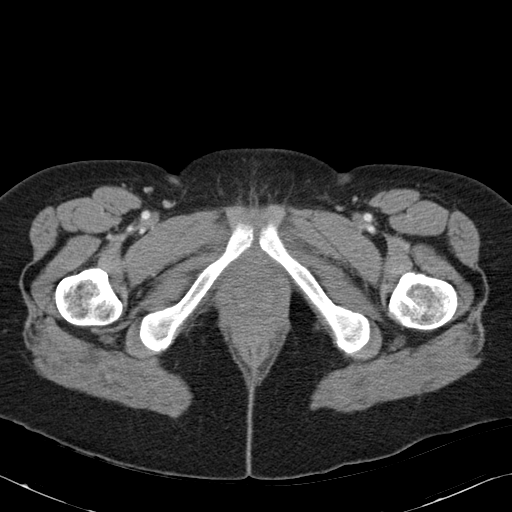
[im 5/85  bone]
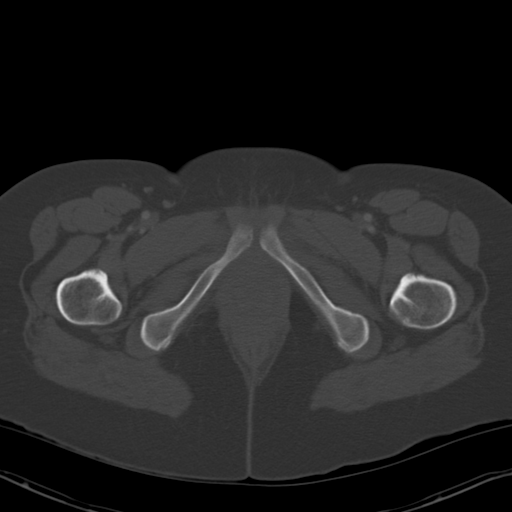
[im 13/85  soft-tissue]
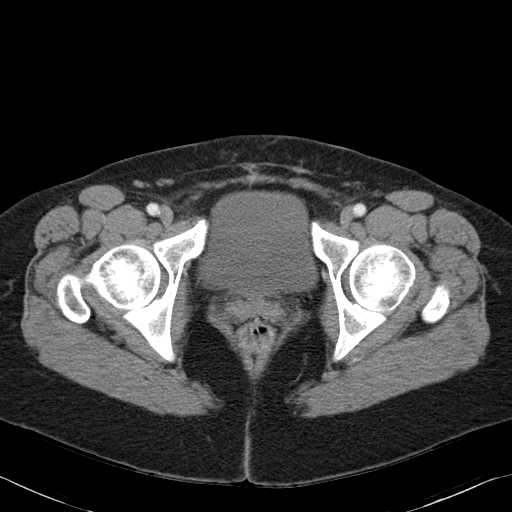
[im 17/85  soft-tissue]
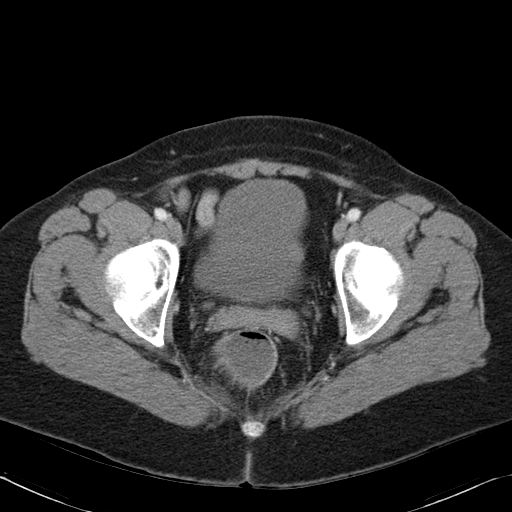
[im 26/85  soft-tissue]
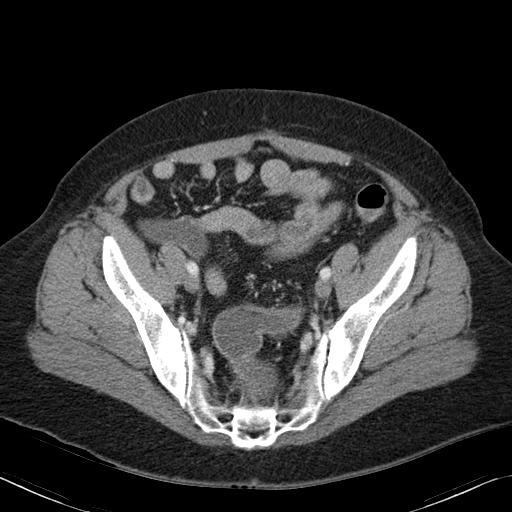
[im 30/85  soft-tissue]
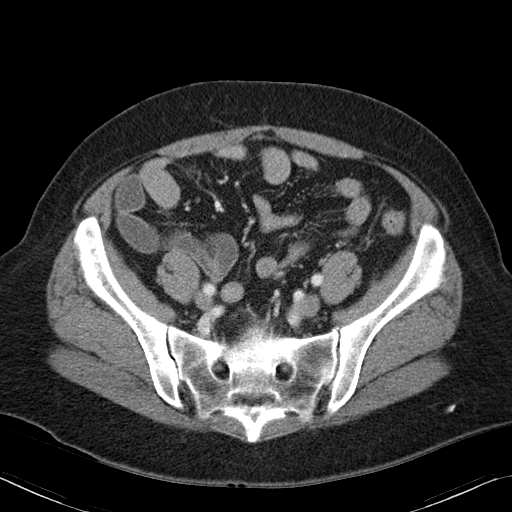
[im 38/85  soft-tissue]
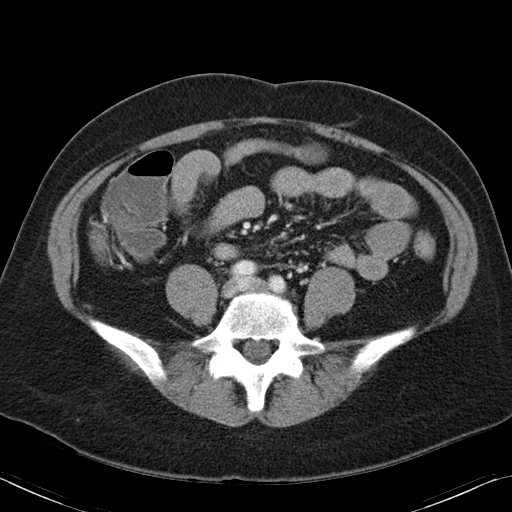
[im 43/85  soft-tissue]
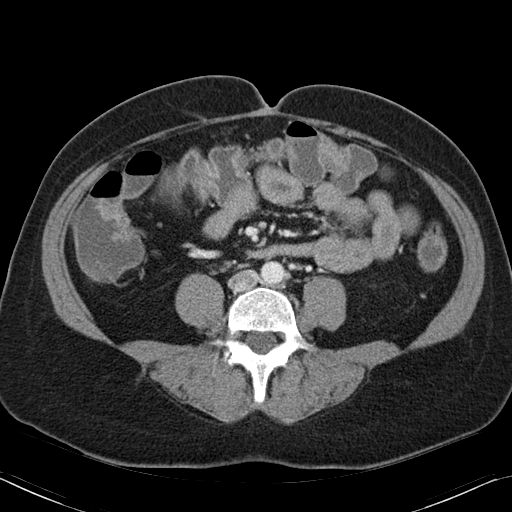
[im 47/85  soft-tissue]
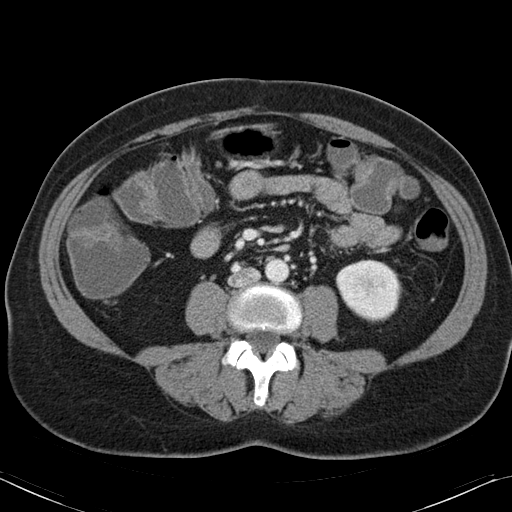
[im 55/85  soft-tissue]
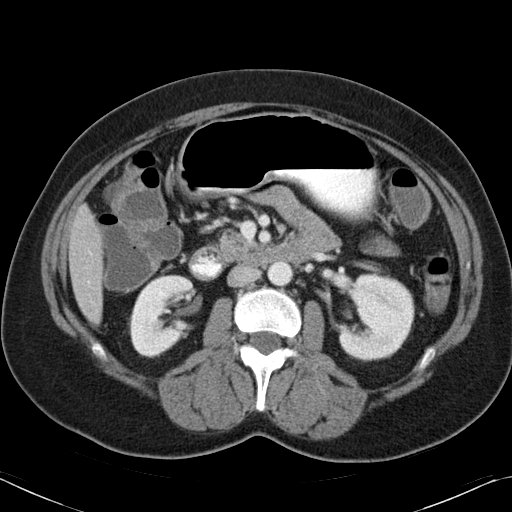
[im 55/85  bone]
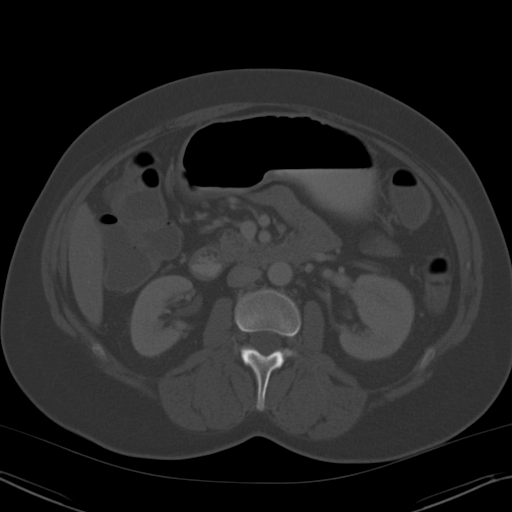
[im 59/85  soft-tissue]
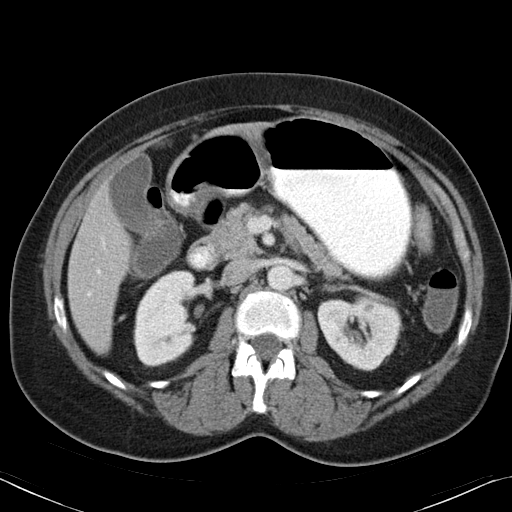
[im 68/85  soft-tissue]
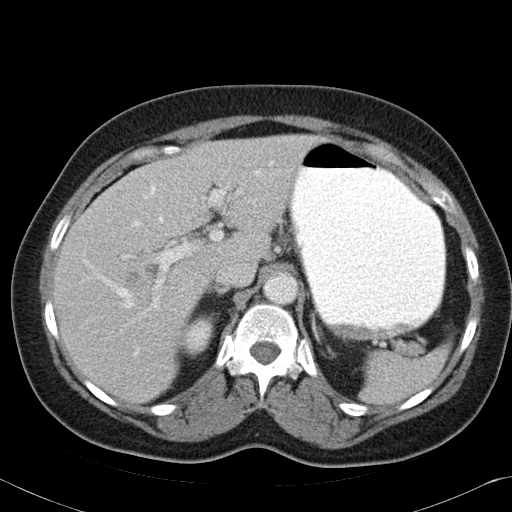
[im 72/85  soft-tissue]
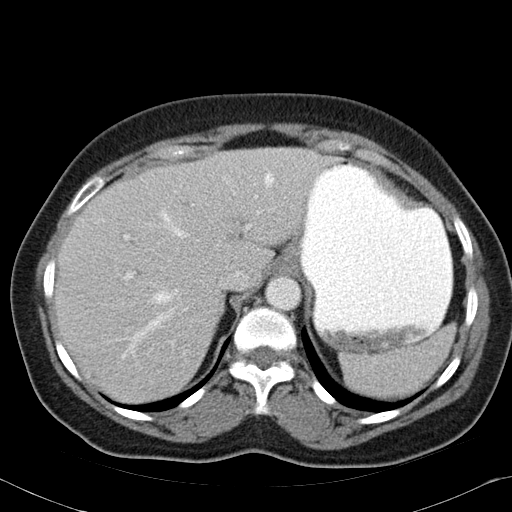
[im 80/85  soft-tissue]
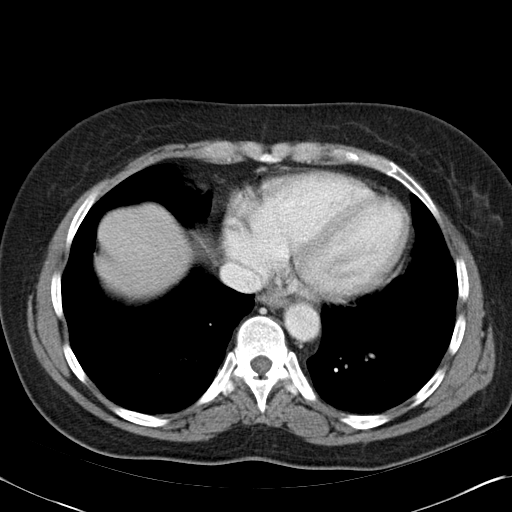

[Series 4: abd_pel_with 3.0 spo cor · coronal · 0.69mm/px · 3 of 92 slices shown]
[im 31/92  soft-tissue]
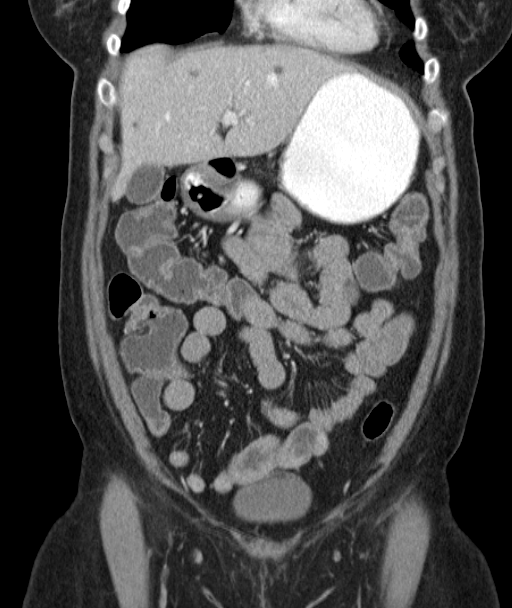
[im 41/92  soft-tissue]
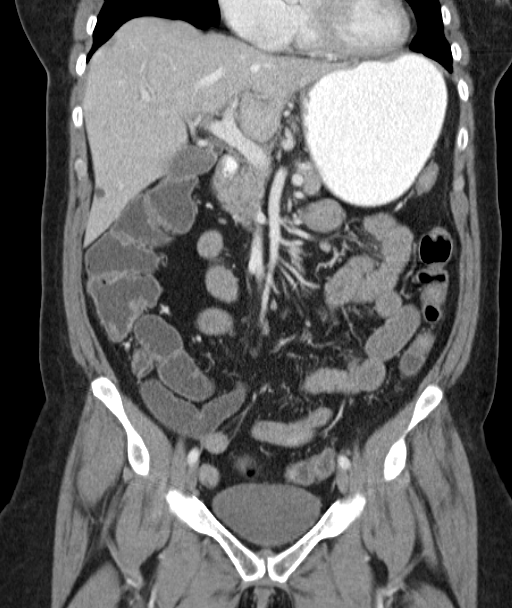
[im 51/92  soft-tissue]
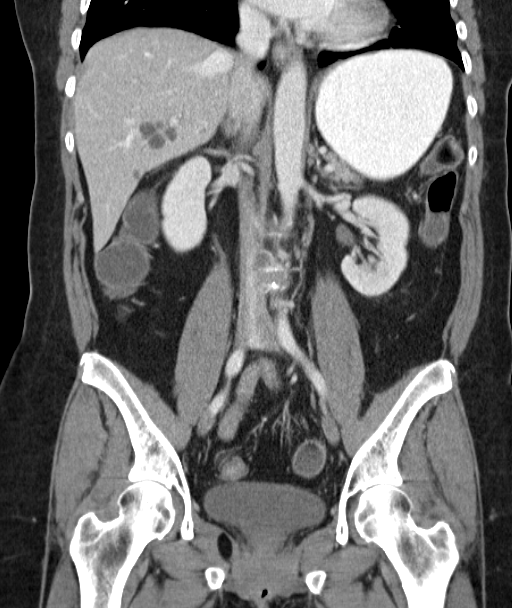

[16 of 46 positions shown; findings below may reference images not displayed]

FINDINGS: Dependent atelectasis at lung bases.
Multiple hepatic cysts, largest right lobe 2.7 cm diameter.
Tiny left renal cyst.
Liver, spleen, pancreas, kidneys, and adrenal glands otherwise
normal appearance.
Fluid-filled bowel loops to rectum.
Hyperenhancement of a small bowel loop in the central pelvis, which
shows diffuse wall thickening, suggesting enteritis.
No evidence of bowel obstruction.
Unremarkable bladder and ureters.
Surgical absence of the uterus with nonvisualization of the ovaries
and appendix. No additional mass, adenopathy, free fluid or
inflammatory process.
No hernia or acute bony lesion.
IMPRESSION: Thickening and increased enhancement of a small bowel loop in the
central pelvis raising question of enteritis.
This can be seen with infection and inflammatory bowel disease,
unlikely from ischemia in the absence of significant vascular
disease changes.
Hepatic and left renal cysts.

## 2012-01-02 MED ORDER — SODIUM CHLORIDE 0.9 % IV BOLUS (SEPSIS)
1000.0000 mL | Freq: Once | INTRAVENOUS | Status: AC
  Administered 2012-01-02: 1000 mL via INTRAVENOUS

## 2012-01-02 MED ORDER — ONDANSETRON HCL 4 MG PO TABS: 4.0000 mg | ORAL_TABLET | Freq: Four times a day (QID) | ORAL | Status: AC

## 2012-01-02 MED ORDER — ONDANSETRON 8 MG PO TBDP
ORAL_TABLET | ORAL | Status: AC
  Filled 2012-01-02: qty 1

## 2012-01-02 MED ORDER — MORPHINE SULFATE 4 MG/ML IJ SOLN
4.0000 mg | Freq: Once | INTRAMUSCULAR | Status: AC
  Administered 2012-01-02: 4 mg via INTRAVENOUS
  Filled 2012-01-02: qty 1

## 2012-01-02 MED ORDER — IOHEXOL 300 MG/ML  SOLN
100.0000 mL | Freq: Once | INTRAMUSCULAR | Status: AC | PRN
  Administered 2012-01-02: 100 mL via INTRAVENOUS

## 2012-01-02 MED ORDER — ONDANSETRON HCL 4 MG PO TABS
8.0000 mg | ORAL_TABLET | Freq: Once | ORAL | Status: AC
  Administered 2012-01-02: 8 mg via ORAL

## 2012-01-02 MED ORDER — ONDANSETRON HCL 4 MG/2ML IJ SOLN
4.0000 mg | Freq: Once | INTRAMUSCULAR | Status: AC
  Administered 2012-01-02: 4 mg via INTRAVENOUS
  Filled 2012-01-02: qty 2

## 2012-01-02 NOTE — ED Notes (Signed)
EDP to see pt next.

## 2012-01-02 NOTE — ED Notes (Signed)
2 daughters in with mother and are both very rude and snappy with me. Requesting to give their mother her home medications.

## 2012-01-02 NOTE — ED Provider Notes (Signed)
History     61 year old female with abdominal pain. Gradual onset about a day ago. Pain is diffuse. Associated with nausea and diarrhea. No blood in stool. No urinary complaints. Patient has a history diverticulitis and says current symptoms feel similar to previous. Patient has a history of multiple bowel surgeries. No sick contacts. No fevers or chills.  CSN: [id number]  Arrival date & time 01/01/12  2348   First MD Initiated Contact with Patient 01/02/12 0138      Chief Complaint  Patient presents with  . Abdominal Pain    (Consider location/radiation/quality/duration/timing/severity/associated sxs/prior treatment) HPI  Past Medical History  Diagnosis Date  . GERD (gastroesophageal reflux disease)   . Gastritis   . Hypertension   . Hyperlipidemia   . Anemia   . Cystitis   . Osteopenia   . Shoulder pain   . Hypothyroidism   . Hashimoto's thyroiditis     Hx   . Urinary incontinence   . Anxiety   . Depression     Past Surgical History  Procedure Date  . Total abdominal hysterectomy 1994  . Umbilical hernia repair   . Breast excisional biopsy 2010    Excisional biopsy of benign left breast mass -lopoma   . Resection of left lobe of thyroid   . Bilateral tubal ligation   . Right carpal tunnel release   . Urethral dilation for stenosis 2009  . Rt. knee athroscopy 2004  . Colonscopy 2005    Dr. Katrinka Blazing  . Esophagogastroduodenoscopy 12/2009    chronic gastritis  . Breast surgery     left nreast biopsy  . Colonoscopy 08/26/2011    Procedure: COLONOSCOPY;  Surgeon: Arlyce Harman, MD;  Location: [facility];  Service: Endoscopy;  Laterality: N/A;  8:30    Family History  Problem Relation Age of Onset  . Colon cancer Neg Hx   . Anesthesia problems Neg Hx   . Hypotension Neg Hx   . Malignant hyperthermia Neg Hx   . Pseudochol deficiency Neg Hx     History  Substance Use Topics  . Smoking status: Former Smoker -- 0.5 packs/day    Types: Cigarettes    Quit  date: 08/25/1953  . Smokeless tobacco: Not on file  . Alcohol Use: No    OB History    Grav Para Term Preterm Abortions TAB SAB Ect Mult Living                  Review of Systems   Review of symptoms negative unless otherwise noted in HPI.   Allergies  Dilaudid and Nsaids  Home Medications   Current Outpatient Rx  Name Route Sig Dispense Refill  . DICLOFENAC SODIUM 1 % TD GEL  Apply twice daily as needed to affected areas for uncontrolled pain 100 g 1  . DIOVAN HCT 160-12.5 MG PO TABS  TAKE 1 TABLET BY MOUTH ONCE A DAY. 30 each 3  . DOCUSATE SODIUM 100 MG PO CAPS  1 po tid 90 capsule 5  . ESCITALOPRAM OXALATE 5 MG PO TABS Oral Take 5 mg by mouth daily. Take one tablet by mouth once a day     . FLUTICASONE PROPIONATE 50 MCG/ACT NA SUSP Nasal Place 2 sprays into the nose daily. 16 g 2  . IMIPRAMINE HCL 25 MG PO TABS Oral Take 1 tablet (25 mg total) by mouth at bedtime. 30 tablet 4  . LOVASTATIN 40 MG PO TABS  TAKE 2 TABLETS BY MOUTH DAILY AT  BEDTIME FOR CHOLESTEROL. 60 tablet 4  . MELOXICAM 15 MG PO TABS  Take one tablet once daily for 10 days, then as needed , maximum 2 tablets per month 30 tablet 0  . POLYETHYLENE GLYCOL 3350 PO PACK  1 po bid with meals 100 each 11  . PROTONIX 40 MG PO TBEC  TAKE 1 TABLET BY MOUTH TWICE DAILY. 60 each 5  . SYNTHROID 100 MCG PO TABS Oral Take 100 mcg by mouth daily.     . TOLTERODINE TARTRATE ER 4 MG PO CP24 Oral Take 1 capsule (4 mg total) by mouth daily. 30 capsule 2    BP 108/50  Pulse 93  Temp(Src) 98.5 F (36.9 C) (Oral)  Resp 20  SpO2 100%  Physical Exam  Nursing note and vitals reviewed. Constitutional:       Laying in bed. No acute distress. Obese  HENT:  Head: Normocephalic and atraumatic.  Eyes: Conjunctivae are normal. Right eye exhibits no discharge. Left eye exhibits no discharge.  Neck: Neck supple.  Cardiovascular: Normal rate, regular rhythm and normal heart sounds.  Exam reveals no gallop and no friction rub.   No  murmur heard. Pulmonary/Chest: Effort normal and breath sounds normal. No respiratory distress.  Abdominal: Soft. She exhibits no distension. There is tenderness.       Mild diffuse abdominal tenderness without guarding or rebound. No hernia appreciated. No mass palpated.  Genitourinary:       No costovertebral angle tenderness  Musculoskeletal: She exhibits no edema and no tenderness.  Neurological: She is alert.  Skin: Skin is warm and dry. She is not diaphoretic.  Psychiatric: She has a normal mood and affect. Her behavior is normal. Thought content normal.    ED Course  Procedures (including critical care time)  Labs Reviewed  CBC - Abnormal; Notable for the following:    Hemoglobin 11.3 (*)    HCT 34.9 (*)    All other components within normal limits  COMPREHENSIVE METABOLIC PANEL - Abnormal; Notable for the following:    Potassium 3.4 (*)    Glucose, Bld 119 (*)    GFR calc non Af Amer 78 (*)    All other components within normal limits  LIPASE, BLOOD  URINALYSIS, ROUTINE W REFLEX MICROSCOPIC   No results found.  Ct Abdomen Pelvis W Contrast  01/02/2012  *RADIOLOGY REPORT*  Clinical Data: Umbilical and bilateral lower quadrant pain, nausea, history diverticulitis, GERD, gastritis, hypertension, anemia, hysterectomy, umbilical hernia repair  CT ABDOMEN AND PELVIS WITH CONTRAST  Technique:  Multidetector CT imaging of the abdomen and pelvis was performed following the standard protocol during bolus administration of intravenous contrast. Sagittal and coronal MPR images reconstructed from axial data set.  Contrast: OMNIPAQUE IOHEXOL 300 MG/ML IV SOLN; Dilute oral contrast.  Comparison: None  Findings: Dependent atelectasis at lung bases. Multiple hepatic cysts, largest right lobe 2.7 cm diameter. Tiny left renal cyst. Liver, spleen, pancreas, kidneys, and adrenal glands otherwise normal appearance. Fluid-filled bowel loops to rectum. Hyperenhancement of a small bowel loop in  the central pelvis, which shows diffuse wall thickening, suggesting enteritis. No evidence of bowel obstruction. Unremarkable bladder and ureters. Surgical absence of the uterus with nonvisualization of the ovaries and appendix. No additional mass, adenopathy, free fluid or inflammatory process. No hernia or acute bony lesion.  IMPRESSION: Thickening and increased enhancement of a small bowel loop in the central pelvis raising question of enteritis. This can be seen with infection and inflammatory bowel disease, unlikely from  ischemia in the absence of significant vascular disease changes. Hepatic and left renal cysts.  Original Report Authenticated By: Lollie Marrow, M.D.    1. Abdominal pain       MDM  61 year old female with abdominal pain and nausea vomiting. CT abdomen pelvis was performed which shows some nonspecific thickening of small bowel. Suspect this is likely secondary to enteritis. There is no other acute pathology seen on CT which explain patient's symptoms. She is afebrile and HD medically stable. She reports much improvement of symptoms after meds and IV fluids. Labs unremarkable. Low clinical suspicion for emergent abdominal surgical process. Return precautions were discussed. Plan continue symptomatic treatment. Outpatient followup.        Raeford Razor, MD 01/09/12 [phone]

## 2012-01-02 NOTE — ED Notes (Signed)
Pt lying resting quietly until her daughters got here. Now pt screaming out "it hurts" I need a pill. Family requesting 4 blankets for their mother. Pt given 1 of the last 2, covered with 2 sheets and her house coat.

## 2012-01-02 NOTE — Discharge Instructions (Signed)
Abdominal Pain Abdominal pain can be caused by many things. Your caregiver decides the seriousness of your pain by an examination and possibly blood tests and X-rays. Many cases can be observed and treated at home. Most abdominal pain is not caused by a disease and will probably improve without treatment. However, in many cases, more time must pass before a clear cause of the pain can be found. Before that point, it may not be known if you need more testing, or if hospitalization or surgery is needed. HOME CARE INSTRUCTIONS   Do not take laxatives unless directed by your caregiver.   Take pain medicine only as directed by your caregiver.   Only take over-the-counter or prescription medicines for pain, discomfort, or fever as directed by your caregiver.   Try a clear liquid diet (broth, tea, or water) for as long as directed by your caregiver. Slowly move to a bland diet as tolerated.  SEEK IMMEDIATE MEDICAL CARE IF:   The pain does not go away.   You have a fever.   You keep throwing up (vomiting).   The pain is felt only in portions of the abdomen. Pain in the right side could possibly be appendicitis. In an adult, pain in the left lower portion of the abdomen could be colitis or diverticulitis.   You pass bloody or black tarry stools.  MAKE SURE YOU:   Understand these instructions.   Will watch your condition.   Will get help right away if you are not doing well or get worse.  Document Released: 07/27/2005 Document Revised: 10/06/2011 Document Reviewed: 06/04/2008 [employer] Patient Information 2012 [employer]  RESOURCE GUIDE  Dental Problems  Patients with Medicaid: [facility]                     [facility] [address]                                           [address] Phone:  [phone]                                                  Phone:  [phone]  If unable to pay or uninsured, contact:  [facility] or [facility] to become qualified for the adult dental clinic.  Chronic Pain Problems Contact [facility]  [phone] Patients need to be referred by their primary care doctor.  Insufficient Money for Medicine Contact [employer]:  call "211" or [facility] 271-5999.  No Primary Care Doctor Call [facility]  832-8000 Other agencies that provide inexpensive medical care    [facility]  [phone]    [facility]  [phone]    [facility]  [phone]    Women[facility] Clinic  [phone]    [facility]  [phone]    [facility]  [phone]  Psychological Services [facility]  [phone] [facility]  [phone] [facility]   800 853-5163 (emergency services 641-4993)  Substance Abuse Resources [facility]  336-882-2125 [facility] 336-784-9470 [facility] 336-285-9073 [facility] 336-845-3988 Residential & Outpatient Substance Abuse Program  800-659-3381    Abuse/Neglect [place] Child Abuse Hotline (336) 641-3795 [place] Child Abuse Hotline 800-378-5315 (After Hours)  Emergency Shelter [facility] (336) 271-5985  [facility] (336) 275-9566 [facility] (704) 372-4663  MRSA Hotline #:   832-7006    [place] Resources  [facility] [place]     [employer]                          [facility] [address] [place]                       [address]      [address]  [place]                                                [place]                            [place] Phone:  [phone]                                   Phone:  [phone]                 Phone:  [phone]  [facility] Phone:  [phone]  [place] Child Abuse Hotline (336) 342-1394 (336) 342-3537 (After  Hours)   

## 2012-01-03 ENCOUNTER — Ambulatory Visit (INDEPENDENT_AMBULATORY_CARE_PROVIDER_SITE_OTHER): Payer: BC Managed Care – PPO | Admitting: Family Medicine

## 2012-01-03 ENCOUNTER — Encounter: Payer: Self-pay | Admitting: Family Medicine

## 2012-01-03 VITALS — BP 104/62 | HR 86 | Resp 18 | Ht 61.0 in | Wt 169.1 lb

## 2012-01-03 DIAGNOSIS — I10 Essential (primary) hypertension: Secondary | ICD-10-CM

## 2012-01-03 DIAGNOSIS — K5289 Other specified noninfective gastroenteritis and colitis: Secondary | ICD-10-CM

## 2012-01-03 DIAGNOSIS — K529 Noninfective gastroenteritis and colitis, unspecified: Secondary | ICD-10-CM

## 2012-01-03 DIAGNOSIS — R42 Dizziness and giddiness: Secondary | ICD-10-CM

## 2012-01-03 DIAGNOSIS — E785 Hyperlipidemia, unspecified: Secondary | ICD-10-CM

## 2012-01-03 NOTE — Patient Instructions (Addendum)
F/u as before.  You have acute gastro enteritits.  Follow BRAT diet, hand hygiene is important, drink fluids often, including gatorade.  Work excuse from 3/4 to return 01/05/2012   Gastritis Gastritis is an irritation of the stomach. It can be caused by anything that bothers the stomach. Some irritants include:  Alcohol.   Caffeine.   Nicotine.   Spicy, acidic, greasy, and fried foods.   Medicines for pain and arthritis.   Emotional distress.  HOME CARE   Only take medicine as told by your doctor.   Take small sips of clear liquids often. Do not drink large amounts of liquids at one time.   If you have watery poop (diarrhea), avoid milk, fruit, tobacco, alcohol, really hot or cold liquids, or too much of anything at one time.   Rest.   Wash your hands often.   Once you can keep clear liquids down, start soups, juices, apple sauce, crackers, and sherbet. Slowly add plain, not spicy, foods to your diet.  GET HELP RIGHT AWAY IF:   You cannot keep fluids down.   You cannot stop throwing up (vomiting) or you throw up blood.   You have more stomach or chest pain.   You have a temperature by mouth above 102 F (38.9 C), not controlled by medicine.   You pass out (faint), feel lightheaded, or are more thirsty than normal.   You have bloody or black poop (stools).   Your watery poop will not go away.   You are not improving or are getting worse.  MAKE SURE YOU:   Understand these instructions.   Will watch your condition.   Will get help right away if you are not doing well or get worse.  Document Released: 04/04/2008 Document Revised: 10/06/2011 Document Reviewed: 09/04/2009 [facility] Patient Information 2012 [place], [place].

## 2012-01-04 ENCOUNTER — Telehealth: Payer: Self-pay | Admitting: Family Medicine

## 2012-01-04 MED ORDER — DICYCLOMINE HCL 10 MG PO CAPS: ORAL_CAPSULE | ORAL | Status: DC

## 2012-01-04 NOTE — Telephone Encounter (Signed)
Medication has been sent in please let her know

## 2012-01-04 NOTE — Telephone Encounter (Signed)
Patient aware.

## 2012-01-04 NOTE — Telephone Encounter (Signed)
Started having bad diarrhea at 5am this morning. Her stomach is really cramping and said to please send her in something to [place]. No nausea at this time

## 2012-01-05 ENCOUNTER — Telehealth: Payer: Self-pay | Admitting: Family Medicine

## 2012-01-05 NOTE — Assessment & Plan Note (Signed)
Reports light headedness and near syncope when she was acutely ill

## 2012-01-05 NOTE — Telephone Encounter (Signed)
On the shelf

## 2012-01-05 NOTE — Telephone Encounter (Signed)
Still having bad stomach cramps, and when she woke up she had the jitters like she was nervous in her stomach. Still very nauseated but not vomiting because she hasn't ate. She is keeping fluids down but she is very weak. Needs from 01/05/2012 until 01/09/2012. Supposed to work tomorrow but will go back Monday

## 2012-01-05 NOTE — Assessment & Plan Note (Signed)
Controlled, no change in medication  

## 2012-01-05 NOTE — Telephone Encounter (Signed)
Note has been signed and is available for pick up

## 2012-01-05 NOTE — Telephone Encounter (Signed)
Will you be willing to excuse her from work without seeing her in the office?

## 2012-01-05 NOTE — Progress Notes (Signed)
  Subjective:    Patient ID: [name], female    DOB: [birth year], 61 y.o.   MRN: [id number]  HPI 2 day h/o severe vomiting , nausea, diarrhea and cramping abdominal pain, pt was too weak to even get to the bathroom and messed all over herself when her family found her at home. She was reportedly very drowsy, seemed to be near passing out and had slurred speech. No h/o head trauma. Pt is still very weak, and having excessive cramping abdominal pain , nausea and vomiting, the diarrhea also continues, she feels weak and has intermittent chills . No visible blood or mucus in the stool. Denies ever having any localized weakness, numbness or difficulty with swallowing, no current neurological symptoms   Review of Systems    See HPI  Denies sinus pressure, nasal congestion, ear pain or sore throat. Denies chest congestion, productive cough or wheezing. Denies chest pains, palpitations and leg swelling   Denies dysuria, frequency, hesitancy or incontinence. Denies joint pain, swelling and limitation in mobility. Denies headaches, seizures, numbness, or tingling. Denies depression, anxiety or insomnia. Denies skin break down or rash.     Objective:   Physical Exam Patient alert and oriented and in no cardiopulmonary distress.Uncomfortable and in pain   HEENT: No facial asymmetry, EOMI, no sinus tenderness,  oropharynx pink and moist.  Neck supple no adenopathy.  Chest: Clear to auscultation bilaterally.  CVS: S1, S2 no murmurs, no S3.  ABD: diffuse superficial tenderness, no guarding or rebound, hyperactive bowel sounds  Ext: No edema  MS: Adequate ROM spine, shoulders, hips and knees.  Skin: Intact, no ulcerations or rash noted.  Psych: Good eye contact, normal affect. Memory intact not anxious or depressed appearing.  CNS: CN 2-12 intact, power, tone and sensation normal throughout.        Assessment & Plan:

## 2012-01-05 NOTE — Assessment & Plan Note (Signed)
Acute severe symptoms, unable to work, excuse provided, pt to call in 2 days if still very symptomatic for extension of work excuse. Pt education and medication for symptoms provided

## 2012-01-05 NOTE — Telephone Encounter (Signed)
pls specifically document on telephone call why she needs to stay out for an additional 4 days, then I will sign

## 2012-01-31 ENCOUNTER — Other Ambulatory Visit: Payer: Self-pay | Admitting: Family Medicine

## 2012-02-11 LAB — LIPID PANEL
Cholesterol: 160 mg/dL (ref 0–200)
Triglycerides: 91 mg/dL (ref <150)
VLDL: 18 mg/dL (ref 0–40)

## 2012-02-12 LAB — COMPLETE METABOLIC PANEL WITHOUT GFR
ALT: 17 U/L (ref 0–35)
AST: 25 U/L (ref 0–37)
Albumin: 4 g/dL (ref 3.5–5.2)
Alkaline Phosphatase: 66 U/L (ref 39–117)
BUN: 12 mg/dL (ref 6–23)
CO2: 30 meq/L (ref 19–32)
Calcium: 9.4 mg/dL (ref 8.4–10.5)
Chloride: 105 meq/L (ref 96–112)
Creat: 0.93 mg/dL (ref 0.50–1.10)
GFR, Est African American: 77 mL/min
GFR, Est Non African American: 67 mL/min
Glucose, Bld: 95 mg/dL (ref 70–99)
Potassium: 4.1 meq/L (ref 3.5–5.3)
Sodium: 140 meq/L (ref 135–145)
Total Bilirubin: 0.5 mg/dL (ref 0.3–1.2)
Total Protein: 6.8 g/dL (ref 6.0–8.3)

## 2012-02-13 LAB — VITAMIN D 25 HYDROXY (VIT D DEFICIENCY, FRACTURES): Vit D, 25-Hydroxy: 34 ng/mL (ref 30–89)

## 2012-02-15 ENCOUNTER — Encounter: Payer: Self-pay | Admitting: Family Medicine

## 2012-02-15 ENCOUNTER — Ambulatory Visit (INDEPENDENT_AMBULATORY_CARE_PROVIDER_SITE_OTHER): Payer: BC Managed Care – PPO | Admitting: Family Medicine

## 2012-02-15 VITALS — BP 118/78 | HR 79 | Resp 15 | Ht 61.0 in | Wt 173.0 lb

## 2012-02-15 DIAGNOSIS — R7301 Impaired fasting glucose: Secondary | ICD-10-CM

## 2012-02-15 DIAGNOSIS — M5430 Sciatica, unspecified side: Secondary | ICD-10-CM

## 2012-02-15 DIAGNOSIS — I10 Essential (primary) hypertension: Secondary | ICD-10-CM

## 2012-02-15 DIAGNOSIS — E785 Hyperlipidemia, unspecified: Secondary | ICD-10-CM

## 2012-02-15 DIAGNOSIS — F411 Generalized anxiety disorder: Secondary | ICD-10-CM

## 2012-02-15 DIAGNOSIS — K219 Gastro-esophageal reflux disease without esophagitis: Secondary | ICD-10-CM

## 2012-02-15 DIAGNOSIS — R32 Unspecified urinary incontinence: Secondary | ICD-10-CM

## 2012-02-15 MED ORDER — ESCITALOPRAM OXALATE 5 MG PO TABS
5.0000 mg | ORAL_TABLET | Freq: Every day | ORAL | Status: DC
Start: 2012-02-15 — End: 2012-08-28

## 2012-02-15 NOTE — Patient Instructions (Signed)
F/u in 4.5 month  It is important that you exercise regularly at least 30 minutes 5 times a week. If you develop chest pain, have severe difficulty breathing, or feel very tired, stop exercising immediately and seek medical attention   A healthy diet is rich in fruit, vegetables and whole grains. Poultry fish, nuts and beans are a healthy choice for protein rather then red meat. A low sodium diet and drinking 64 ounces of water daily is generally recommended. Oils and sweet should be limited. Carbohydrates especially for those who are diabetic or overweight, should be limited to 34-45 gram per meal. It is important to eat on a regular schedule, at least 3 times daily. Snacks should be primarily fruits, vegetables or nuts.   Weight loss goal of 2 pounds per month   Labs are excellent, you need to resume medication for anxiety.  hBA1C non fasting in 4.5 month

## 2012-02-15 NOTE — Progress Notes (Signed)
  Subjective:    Patient ID: [name], female    DOB: 1950-11-12, 61 y.o.   MRN: [id number]  HPI The PT is here for follow up and re-evaluation of chronic medical conditions, medication management and review of any available recent lab and radiology data.  Preventive health is updated, specifically  Cancer screening and Immunization.   Questions or concerns regarding consultations or procedures which the PT has had in the interim are  addressed. The PT denies any adverse reactions to current medications since the last visit.  C/o itching on scalp in the past 2 weeks since stopping medication for anxiety. She also notes increase in anxiety, had no reason for discontinuing th medication     Review of Systems See HPI Denies recent fever or chills. Denies sinus pressure, nasal congestion, ear pain or sore throat. Denies chest congestion, productive cough or wheezing. Denies chest pains, palpitations and leg swelling Denies abdominal pain, nausea, vomiting,diarrhea or constipation.   Denies dysuria, frequency, hesitancy or incontinence. Denies joint pain, swelling and limitation in mobility. Denies headaches, seizures, numbness, or tingling. Denies depression,  or insomnia.notes increase in anxiety Denies skin break down or rash.        Objective:   Physical Exam  Patient alert and oriented and in no cardiopulmonary distress.  HEENT: No facial asymmetry, EOMI, no sinus tenderness,  oropharynx pink and moist.  Neck supple no adenopathy.  Chest: Clear to auscultation bilaterally.  CVS: S1, S2 no murmurs, no S3.  ABD: Soft non tender. Bowel sounds normal.  Ext: No edema  MS: Adequate ROM spine, shoulders, hips and knees.  Skin: Intact, no ulcerations or rash noted.  Psych: Good eye contact, normal affect. Memory intact not anxious or depressed appearing.  CNS: CN 2-12 intact, power, tone and sensation normal throughout.       Assessment & Plan:

## 2012-02-16 MED ORDER — VALSARTAN-HYDROCHLOROTHIAZIDE 160-12.5 MG PO TABS: 1.0000 | ORAL_TABLET | Freq: Every day | ORAL | Status: DC

## 2012-02-16 MED ORDER — MELOXICAM 15 MG PO TABS: ORAL_TABLET | ORAL | Status: DC

## 2012-02-16 MED ORDER — LEVOTHYROXINE SODIUM 100 MCG PO TABS: 100.0000 ug | ORAL_TABLET | Freq: Every day | ORAL | Status: DC

## 2012-02-16 MED ORDER — POLYETHYLENE GLYCOL 3350 17 G PO PACK: PACK | ORAL | Status: DC

## 2012-02-16 MED ORDER — PANTOPRAZOLE SODIUM 40 MG PO TBEC: 40.0000 mg | DELAYED_RELEASE_TABLET | Freq: Every day | ORAL | Status: DC

## 2012-02-18 NOTE — Assessment & Plan Note (Signed)
Controlled, no change in medication  

## 2012-02-18 NOTE — Assessment & Plan Note (Signed)
Currently controled on no medication

## 2012-02-19 NOTE — Assessment & Plan Note (Signed)
Hyperlipidemia:Low fat diet discussed and encouraged.  Controlled, no change in medication   

## 2012-02-19 NOTE — Assessment & Plan Note (Signed)
Increased and uncontrolled off medication, pt to resume same

## 2012-05-28 ENCOUNTER — Telehealth: Payer: Self-pay | Admitting: Family Medicine
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
## 2012-05-29 NOTE — Telephone Encounter (Signed)
Called patient left message to see how she was feeling and to give me a call back

## 2012-05-29 NOTE — Telephone Encounter (Signed)
pls let her know this will heal on its own. No need to put anything there

## 2012-05-30 NOTE — Telephone Encounter (Signed)
Patient aware.

## 2012-06-06 ENCOUNTER — Other Ambulatory Visit: Payer: Self-pay | Admitting: Family Medicine

## 2012-06-11 LAB — HEMOGLOBIN A1C
Hgb A1c MFr Bld: 5.7 % — ABNORMAL HIGH (ref <5.7)
Mean Plasma Glucose: 117 mg/dL — ABNORMAL HIGH (ref <117)

## 2012-06-29 ENCOUNTER — Other Ambulatory Visit: Payer: Self-pay | Admitting: Family Medicine

## 2012-07-03 ENCOUNTER — Other Ambulatory Visit: Payer: Self-pay | Admitting: Family Medicine

## 2012-07-17 ENCOUNTER — Ambulatory Visit (INDEPENDENT_AMBULATORY_CARE_PROVIDER_SITE_OTHER): Payer: PRIVATE HEALTH INSURANCE | Admitting: Family Medicine

## 2012-07-17 ENCOUNTER — Encounter: Payer: Self-pay | Admitting: Family Medicine

## 2012-07-17 VITALS — BP 130/80 | HR 71 | Resp 18 | Ht 61.0 in

## 2012-07-17 DIAGNOSIS — R42 Dizziness and giddiness: Secondary | ICD-10-CM

## 2012-07-17 DIAGNOSIS — I10 Essential (primary) hypertension: Secondary | ICD-10-CM

## 2012-07-17 DIAGNOSIS — K219 Gastro-esophageal reflux disease without esophagitis: Secondary | ICD-10-CM

## 2012-07-17 DIAGNOSIS — Z23 Encounter for immunization: Secondary | ICD-10-CM

## 2012-07-17 DIAGNOSIS — R112 Nausea with vomiting, unspecified: Secondary | ICD-10-CM

## 2012-07-17 MED ORDER — ONDANSETRON HCL 4 MG/2ML IJ SOLN
4.0000 mg | Freq: Once | INTRAMUSCULAR | Status: AC
  Administered 2012-07-17: 4 mg via INTRAMUSCULAR

## 2012-07-17 MED ORDER — MECLIZINE HCL 25 MG PO TABS: 25.0000 mg | ORAL_TABLET | Freq: Three times a day (TID) | ORAL | Status: DC | PRN

## 2012-07-17 MED ORDER — PROMETHAZINE HCL 12.5 MG PO TABS: 12.5000 mg | ORAL_TABLET | Freq: Four times a day (QID) | ORAL | Status: DC | PRN

## 2012-07-17 NOTE — Progress Notes (Signed)
  Subjective:    Patient ID: [name], female    DOB: Feb 10, 1951, 61 y.o.   MRN: [id number]  HPI Acute onset of vertigo this morning pt spinning, also nausea, and vomited once. No recent viral illness. No fever or chills.Had similar episode a[pprox 2 years ago Has noted hand pain and stiffness in the mornings relieved with cold water for the past week, does not want arthritis, as she has to work with her hands. No other concerns   Review of Systems See HPI Denies recent fever or chills. Denies sinus pressure, nasal congestion, ear pain or sore throat. Denies chest congestion, productive cough or wheezing. Denies chest pains, palpitations and leg swelling Denies abdominal pain, nausea, vomiting,diarrhea or constipation.   Denies dysuria, frequency, hesitancy or incontinence. Denies headaches, seizures, numbness, or tingling. Denies depression, anxiety or insomnia. Denies skin break down or rash.        Objective:   Physical Exam  Patient alert and oriented and in no cardiopulmonary distress.  HEENT: No facial asymmetry, EOMI, no sinus tenderness,  oropharynx pink and moist.  Neck supple no adenopathy.no nystagmus  Chest: Clear to auscultation bilaterally.  CVS: S1, S2 no murmurs, no S3.  ABD: Soft non tender. Bowel sounds normal.  Ext: No edema  MS: Adequate ROM spine, shoulders, hips and knees.Ptunable to walk without assistance due to severe vertigo  Skin: Intact, no ulcerations or rash noted.  Psych: Good eye contact, normal affect. Memory intact not anxious or depressed appearing.  CNS: CN 2-12 intact, power, tone and sensation normal throughout.      Assessment & Plan:

## 2012-07-17 NOTE — Patient Instructions (Addendum)
F/u in 6 weeks  You are being treated for acute vertigo and nausea today. Zofran is given in the office ,adn medications are sent in. You need to take meclizine  on a regular schedule as prescribed for the next 3 to 5 days.  Work excuse today to return on 07/23/2012   If you are not improving by Friday morning, please call so you can be referred to ENT  Hand exam does not suggest severe rheumatoid arthritis, Use of alleve one twice daily for 3 to 5 days will help with joint stiffness Flu vaccine today

## 2012-07-18 ENCOUNTER — Ambulatory Visit: Payer: BC Managed Care – PPO | Admitting: Family Medicine

## 2012-07-20 DIAGNOSIS — R112 Nausea with vomiting, unspecified: Secondary | ICD-10-CM | POA: Insufficient documentation

## 2012-07-20 NOTE — Assessment & Plan Note (Signed)
Controlled, no change in medication  

## 2012-07-20 NOTE — Assessment & Plan Note (Signed)
Controlled, no change in medication DASH diet and commitment to daily physical activity for a minimum of 30 minutes discussed and encouraged, as a part of hypertension management. The importance of attaining a healthy weight is also discussed.  

## 2012-07-20 NOTE — Assessment & Plan Note (Signed)
Acute disabling episode of vertigo, will take out of work for 1 week and treat aggressively

## 2012-07-20 NOTE — Assessment & Plan Note (Signed)
Acute episode associated with vertigo, zofran in office and phenergan at [facility]oe as needed

## 2012-08-01 ENCOUNTER — Other Ambulatory Visit: Payer: Self-pay | Admitting: Family Medicine

## 2012-08-01 DIAGNOSIS — Z139 Encounter for screening, unspecified: Secondary | ICD-10-CM

## 2012-08-02 ENCOUNTER — Other Ambulatory Visit: Payer: Self-pay | Admitting: Family Medicine

## 2012-08-28 ENCOUNTER — Encounter: Payer: Self-pay | Admitting: Family Medicine

## 2012-08-28 ENCOUNTER — Ambulatory Visit (INDEPENDENT_AMBULATORY_CARE_PROVIDER_SITE_OTHER): Payer: PRIVATE HEALTH INSURANCE | Admitting: Family Medicine

## 2012-08-28 VITALS — BP 118/68 | HR 82 | Resp 18 | Ht 61.0 in | Wt 174.1 lb

## 2012-08-28 DIAGNOSIS — Z131 Encounter for screening for diabetes mellitus: Secondary | ICD-10-CM

## 2012-08-28 DIAGNOSIS — M79642 Pain in left hand: Secondary | ICD-10-CM

## 2012-08-28 DIAGNOSIS — E785 Hyperlipidemia, unspecified: Secondary | ICD-10-CM

## 2012-08-28 DIAGNOSIS — F411 Generalized anxiety disorder: Secondary | ICD-10-CM

## 2012-08-28 DIAGNOSIS — I10 Essential (primary) hypertension: Secondary | ICD-10-CM

## 2012-08-28 DIAGNOSIS — M79641 Pain in right hand: Secondary | ICD-10-CM | POA: Insufficient documentation

## 2012-08-28 DIAGNOSIS — K219 Gastro-esophageal reflux disease without esophagitis: Secondary | ICD-10-CM

## 2012-08-28 DIAGNOSIS — M79609 Pain in unspecified limb: Secondary | ICD-10-CM

## 2012-08-28 DIAGNOSIS — E890 Postprocedural hypothyroidism: Secondary | ICD-10-CM

## 2012-08-28 MED ORDER — PREDNISONE (PAK) 5 MG PO TABS: 5.0000 mg | ORAL_TABLET | ORAL | Status: DC

## 2012-08-28 NOTE — Progress Notes (Signed)
  Subjective:    Patient ID: Laura [name], female    DOB: [birth year], 61 y.o.   MRN: [id number]  HPI The PT is here for follow up and re-evaluation of chronic medical conditions, medication management and review of any available recent lab and radiology data.  Preventive health is updated, specifically  Cancer screening and Immunization.   Questions or concerns regarding consultations or procedures which the PT has had in the interim are  addressed. The PT denies any adverse reactions to current medications since the last visit.  There are no new concerns.  C/o bilateral hand pain and stiffness and is requesting med for this    Review of Systems See HPI Denies recent fever or chills. Denies sinus pressure, nasal congestion, ear pain or sore throat. Denies chest congestion, productive cough or wheezing. Denies chest pains, palpitations and leg swelling Denies abdominal pain, nausea, vomiting,diarrhea or constipation.   Denies dysuria, frequency, hesitancy or incontinence.  Denies headaches, seizures, numbness, or tingling. Denies depression, anxiety or insomnia. Denies skin break down or rash.        Objective:   Physical Exam Patient alert and oriented and in no cardiopulmonary distress.  HEENT: No facial asymmetry, EOMI, no sinus tenderness,  oropharynx pink and moist.  Neck supple no adenopathy.  Chest: Clear to auscultation bilaterally.  CVS: S1, S2 no murmurs, no S3.  ABD: Soft non tender. Bowel sounds normal.  Ext: No edema  MS: Adequate ROM spine, shoulders, hips and knees.Swelling of finger joints with mild stiffness  Skin: Intact, no ulcerations or rash noted.  Psych: Good eye contact, normal affect. Memory intact not anxious or depressed appearing.  CNS: CN 2-12 intact, power, tone and sensation normal throughout.        Assessment & Plan:

## 2012-08-28 NOTE — Patient Instructions (Addendum)
F/u in 4.5 month  hBA1C and fasting lipid and cmp in 4.5 month  It is important that you exercise regularly at least 30 minutes 5 times a week. If you develop chest pain, have severe difficulty breathing, or feel very tired, stop exercising immediately and seek medical attention    A healthy diet is rich in fruit, vegetables and whole grains. Poultry fish, nuts and beans are a healthy choice for protein rather then red meat. A low sodium diet and drinking 64 ounces of water daily is generally recommended. Oils and sweet should be limited. Carbohydrates especially for those who are diabetic or overweight, should be limited to 34-45 gram per meal. It is important to eat on a regular schedule, at least 3 times daily. Snacks should be primarily fruits, vegetables or nuts.   Prednisone dose pack sent in for pain in hands, if no better call for referral to Dr Eduard Clos  Weight loss goal of 5 to 8 pounds in next 4.5 month

## 2012-09-04 ENCOUNTER — Ambulatory Visit (HOSPITAL_COMMUNITY)
Admission: RE | Admit: 2012-09-04 | Discharge: 2012-09-04 | Disposition: A | Discharge status: Home / Self Care | Payer: BC Managed Care – PPO | Source: Ambulatory Visit | Attending: Family Medicine | Admitting: Family Medicine

## 2012-09-04 DIAGNOSIS — Z139 Encounter for screening, unspecified: Secondary | ICD-10-CM

## 2012-09-04 DIAGNOSIS — Z1231 Encounter for screening mammogram for malignant neoplasm of breast: Secondary | ICD-10-CM | POA: Insufficient documentation

## 2012-09-04 IMAGING — MG MM DIGITAL SCREENING BILAT
4 series · 4 of 4 positions shown · non-contrast
Comparison: Previous exams.

CLINICAL DATA: Screening.

DIGITAL BILATERAL SCREENING MAMMOGRAM WITH CAD

[L CC]
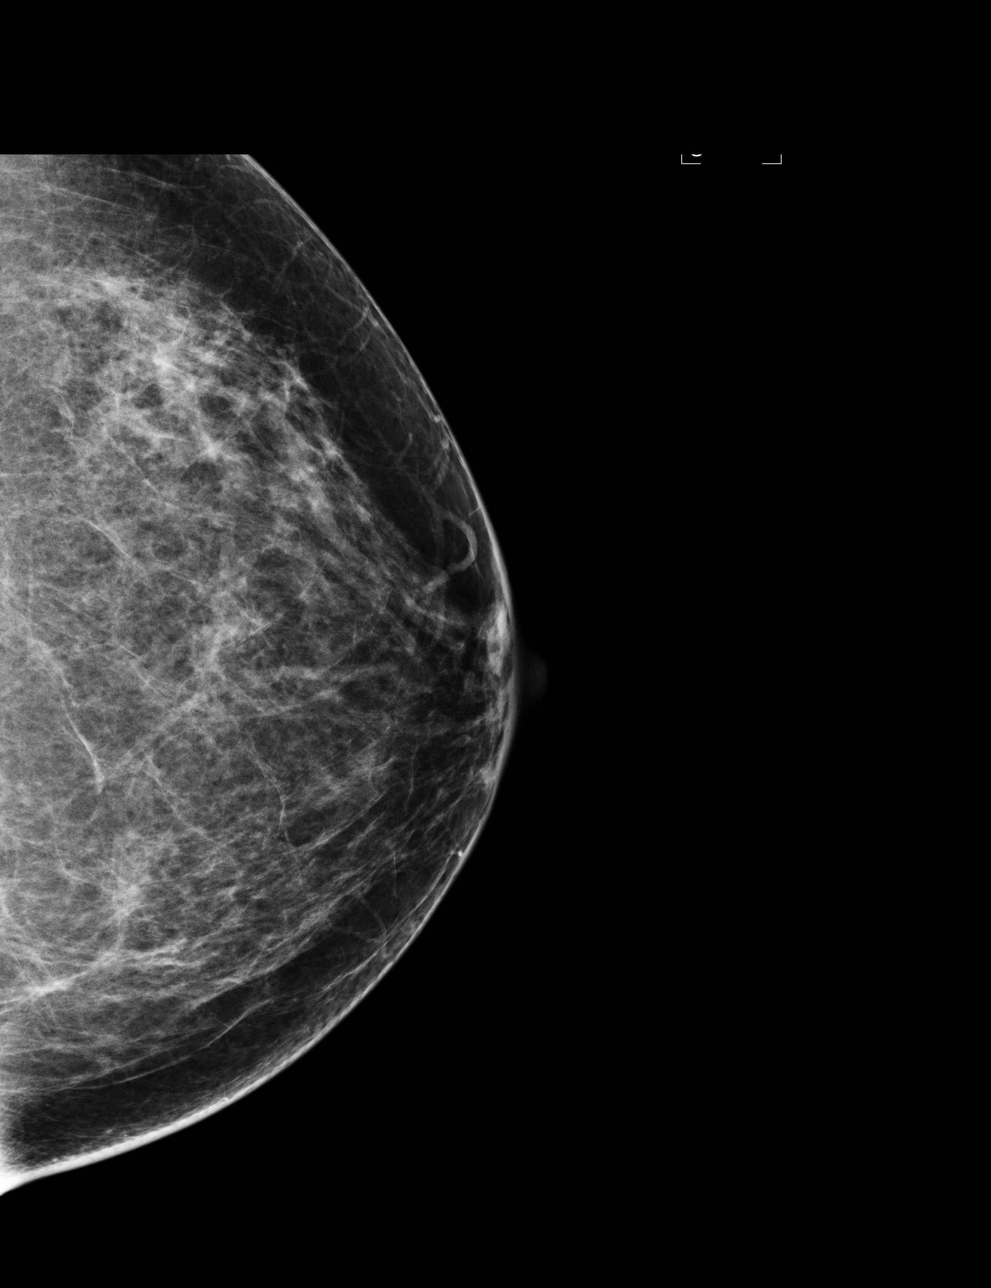

[L MLO]
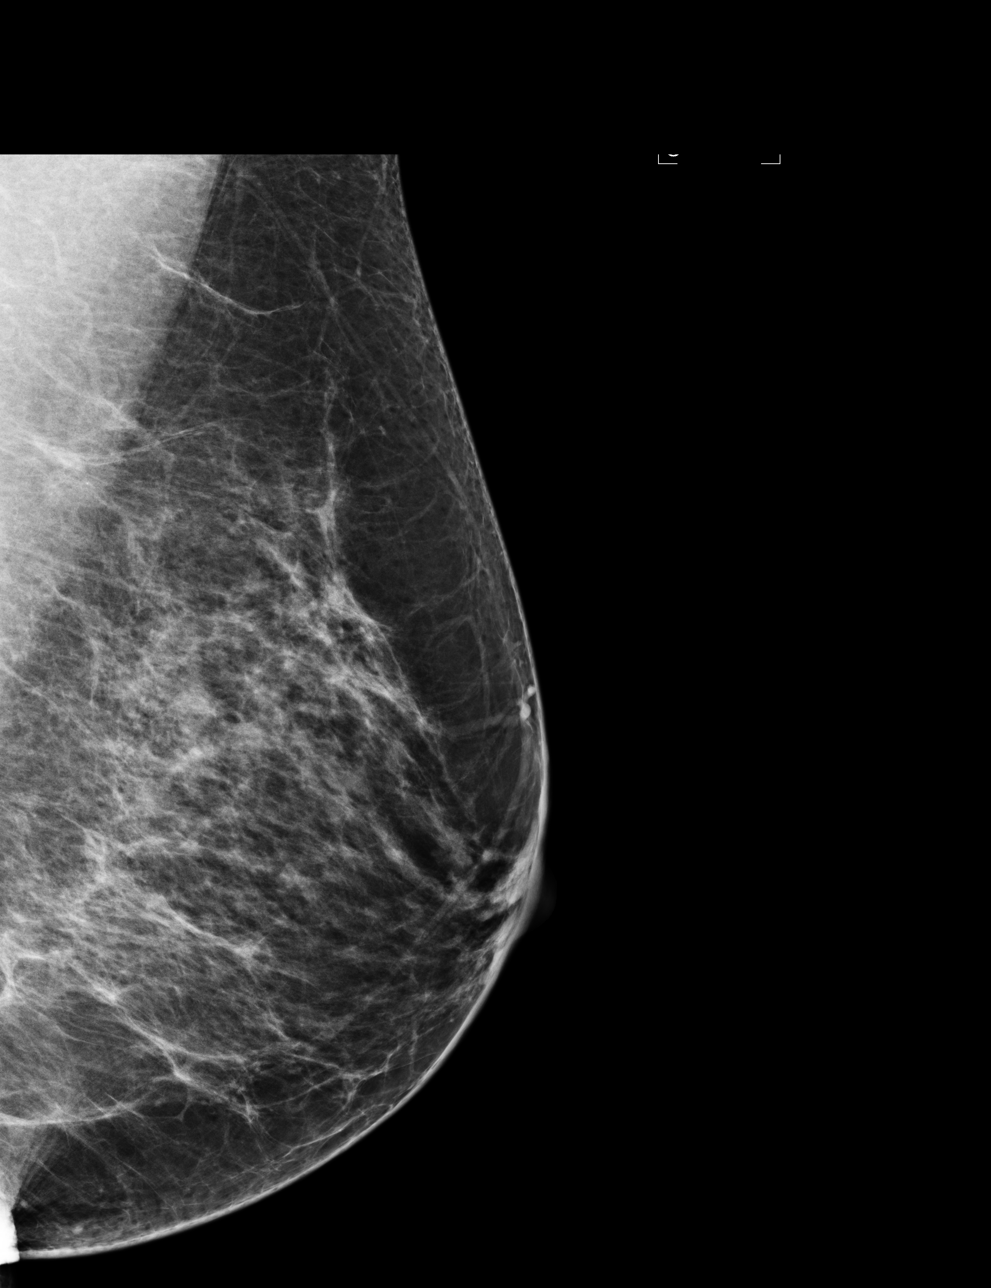

[R CC]
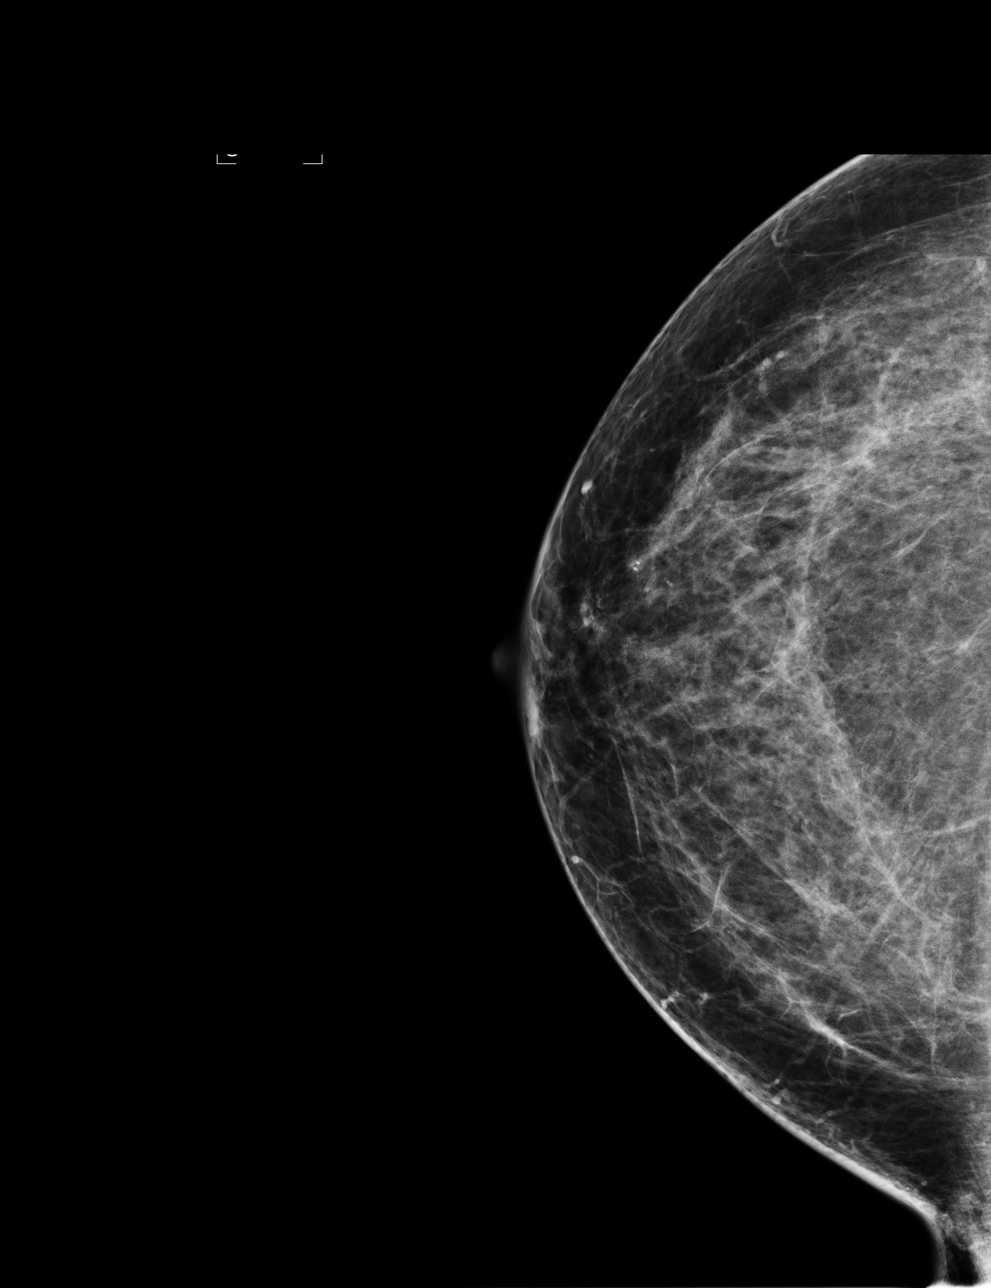

[R MLO]
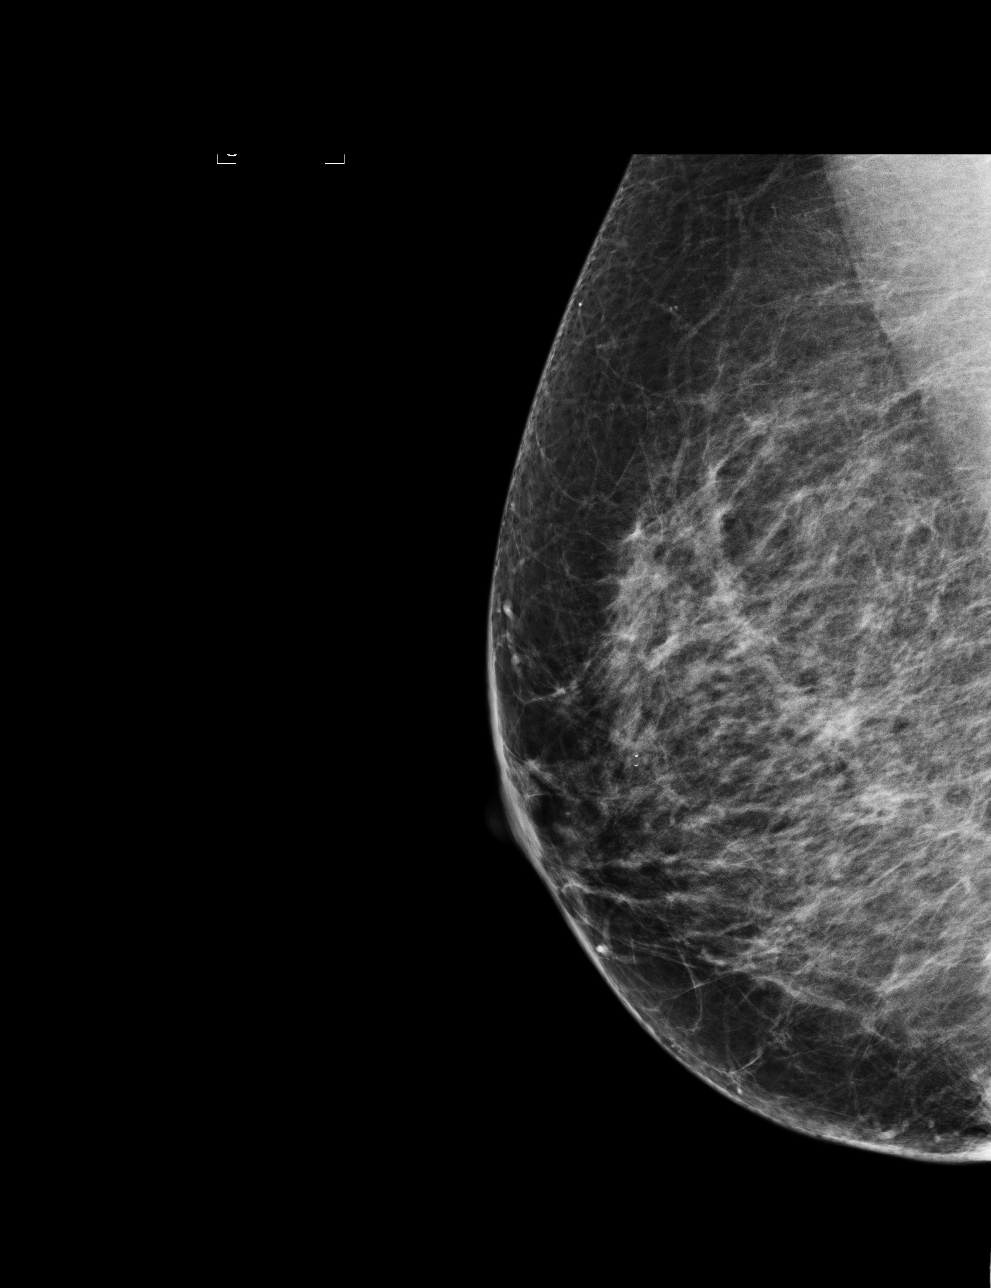

[4 of 4 positions shown; findings below may reference images not displayed]

FINDINGS: There are scattered fibroglandular densities. No
suspicious masses, architectural distortion, or calcifications are
present.

Images were processed with CAD.
IMPRESSION: No mammographic evidence of malignancy.

A result letter of this screening mammogram will be mailed directly
to the patient.

RECOMMENDATION:
Screening mammogram in one year. (Code:[XG])

BI-RADS CATEGORY 1:  Negative.

## 2012-09-09 NOTE — Assessment & Plan Note (Signed)
Controlled on supplement and followed by local endo

## 2012-09-09 NOTE — Assessment & Plan Note (Signed)
Hyperlipidemia:Low fat diet discussed and encouraged.  Controlled, no change in medication   

## 2012-09-09 NOTE — Assessment & Plan Note (Signed)
Controlled, no change in medication  

## 2012-09-09 NOTE — Assessment & Plan Note (Signed)
Controlled, no change in medication DASH diet and commitment to daily physical activity for a minimum of 30 minutes discussed and encouraged, as a part of hypertension management. The importance of attaining a healthy weight is also discussed.  

## 2012-09-09 NOTE — Assessment & Plan Note (Signed)
Increased and uncontrolled, steroid dose pack prescribed

## 2012-10-01 ENCOUNTER — Telehealth: Payer: Self-pay | Admitting: Family Medicine

## 2012-10-04 NOTE — Telephone Encounter (Signed)
Pt was referred to dr. Eduard Clos office. They will call pt with appt and time. Pt aware

## 2012-10-10 ENCOUNTER — Telehealth: Payer: Self-pay | Admitting: Family Medicine

## 2012-10-10 NOTE — Telephone Encounter (Signed)
pls refer to Dr Gerilyn Pilgrim

## 2012-10-11 ENCOUNTER — Telehealth: Payer: Self-pay | Admitting: Family Medicine

## 2012-10-12 NOTE — Telephone Encounter (Signed)
Left message for patient to call back  

## 2012-10-17 NOTE — Telephone Encounter (Signed)
Patient is aware that Dr. Harriett Sine office will call her with an appointment

## 2012-11-06 ENCOUNTER — Other Ambulatory Visit: Payer: Self-pay | Admitting: Family Medicine

## 2012-11-08 ENCOUNTER — Other Ambulatory Visit: Payer: Self-pay

## 2012-11-08 MED ORDER — LOVASTATIN 40 MG PO TABS: ORAL_TABLET | ORAL | Status: DC

## 2012-12-06 LAB — LIPID PANEL
HDL: 45 mg/dL (ref >39)
LDL Cholesterol: 112 mg/dL — ABNORMAL HIGH (ref 0–99)
Total CHOL/HDL Ratio: 4.2 Ratio
Triglycerides: 156 mg/dL — ABNORMAL HIGH (ref <150)
VLDL: 31 mg/dL (ref 0–40)

## 2012-12-06 LAB — COMPREHENSIVE METABOLIC PANEL
ALT: 12 U/L (ref 0–35)
AST: 18 U/L (ref 0–37)
Albumin: 4 g/dL (ref 3.5–5.2)
BUN: 11 mg/dL (ref 6–23)
Calcium: 9.7 mg/dL (ref 8.4–10.5)
Chloride: 103 mEq/L (ref 96–112)
Potassium: 3.8 mEq/L (ref 3.5–5.3)
Sodium: 139 mEq/L (ref 135–145)
Total Protein: 7.3 g/dL (ref 6.0–8.3)

## 2012-12-06 LAB — HEMOGLOBIN A1C: Hgb A1c MFr Bld: 5.8 % — ABNORMAL HIGH (ref <5.7)

## 2012-12-10 ENCOUNTER — Encounter: Payer: Self-pay | Admitting: Family Medicine

## 2012-12-27 ENCOUNTER — Encounter: Payer: Self-pay | Admitting: Family Medicine

## 2012-12-27 ENCOUNTER — Ambulatory Visit (INDEPENDENT_AMBULATORY_CARE_PROVIDER_SITE_OTHER): Payer: BC Managed Care – PPO | Admitting: Family Medicine

## 2012-12-27 ENCOUNTER — Other Ambulatory Visit: Payer: Self-pay

## 2012-12-27 VITALS — BP 120/82 | HR 82 | Resp 16 | Ht 61.0 in | Wt 182.0 lb

## 2012-12-27 DIAGNOSIS — R5381 Other malaise: Secondary | ICD-10-CM

## 2012-12-27 DIAGNOSIS — M79609 Pain in unspecified limb: Secondary | ICD-10-CM

## 2012-12-27 DIAGNOSIS — M542 Cervicalgia: Secondary | ICD-10-CM

## 2012-12-27 DIAGNOSIS — R7302 Impaired glucose tolerance (oral): Secondary | ICD-10-CM

## 2012-12-27 DIAGNOSIS — M79641 Pain in right hand: Secondary | ICD-10-CM

## 2012-12-27 DIAGNOSIS — K219 Gastro-esophageal reflux disease without esophagitis: Secondary | ICD-10-CM

## 2012-12-27 DIAGNOSIS — D649 Anemia, unspecified: Secondary | ICD-10-CM

## 2012-12-27 DIAGNOSIS — E669 Obesity, unspecified: Secondary | ICD-10-CM

## 2012-12-27 DIAGNOSIS — E785 Hyperlipidemia, unspecified: Secondary | ICD-10-CM

## 2012-12-27 DIAGNOSIS — F411 Generalized anxiety disorder: Secondary | ICD-10-CM

## 2012-12-27 DIAGNOSIS — R7309 Other abnormal glucose: Secondary | ICD-10-CM

## 2012-12-27 DIAGNOSIS — I10 Essential (primary) hypertension: Secondary | ICD-10-CM

## 2012-12-27 DIAGNOSIS — R7301 Impaired fasting glucose: Secondary | ICD-10-CM

## 2012-12-27 MED ORDER — ESCITALOPRAM OXALATE 5 MG PO TABS: 5.0000 mg | ORAL_TABLET | Freq: Every day | ORAL | Status: DC

## 2012-12-27 MED ORDER — PANTOPRAZOLE SODIUM 40 MG PO TBEC: DELAYED_RELEASE_TABLET | ORAL | Status: DC

## 2012-12-27 MED ORDER — TIZANIDINE HCL 4 MG PO TABS: ORAL_TABLET | ORAL | Status: AC

## 2012-12-27 NOTE — Patient Instructions (Addendum)
F/u in  5 month, please call if you need me before.  Blood pressure is excellent, no med change  Stop citalopram , since you no longer have anxiety and stress I do not believe that you need this anymore  Weight , blood sugar and cholesterol have all increased.  Please commit to exercise 30 minutes every dya, and weight loss so that your health imoproves. Weigth loss goal of 10 pounds.  Medication sent for neck pain ,muscle relaxant, OK to use tylenol one twice daily for 5 days with this, which I believe [name] due to arthritis , and you are referred to a hand specialist for right hand pain and weakness  Fasting lipid, cmp , cbc andhBa1C in 5 month  Congrats on retirement...enjoy!

## 2012-12-27 NOTE — Progress Notes (Signed)
  Subjective:    Patient ID: Laura [name], female    DOB: 08-24-1951, 62 y.o.   MRN: [id number]  HPI The PT is here for follow up and re-evaluation of chronic medical conditions, medication management and review of any available recent lab and radiology data.  Preventive health is updated, specifically  Cancer screening and Immunization.   Questions or concerns regarding consultations or procedures which the PT has had in the interim are  addressed. The PT denies any adverse reactions to current medications since the last visit.  Recently retired, enjoying this , however significant weight gain, discussed need to reverse this C/o increased right hand pain and weakness, also neck pain with right spasm x 1 week      Review of Systems See HPI Denies recent fever or chills. Denies sinus pressure, nasal congestion, ear pain or sore throat. Denies chest congestion, productive cough or wheezing. Denies chest pains, palpitations and leg swelling Denies abdominal pain, nausea, vomiting,diarrhea or constipation.   Denies dysuria, frequency, hesitancy or incontinence. Denies headaches, seizures, numbness, or tingling. Denies depression, anxiety or insomnia. Denies skin break down or rash.        Objective:   Physical Exam Patient alert and oriented and in no cardiopulmonary distress.  HEENT: No facial asymmetry, EOMI, no sinus tenderness,  oropharynx pink and moist.  Neck decreased ROM c spine with right trapezius spasm no adenopathy.  Chest: Clear to auscultation bilaterally.  CVS: S1, S2 no murmurs, no S3.  ABD: Soft non tender. Bowel sounds normal.  Ext: No edema  MS: Adequate ROM lumbar  spine, shoulders, hips and knees.  Skin: Intact, no ulcerations or rash noted.  Psych: Good eye contact, normal affect. Memory intact not anxious or depressed appearing.  CNS: CN 2-12 intact, decreased grip in right hand with tenderness along the right MP joints       Assessment &  Plan:

## 2012-12-28 ENCOUNTER — Other Ambulatory Visit: Payer: Self-pay

## 2012-12-28 MED ORDER — SYNTHROID 100 MCG PO TABS: ORAL_TABLET | ORAL | Status: DC

## 2012-12-28 MED ORDER — HYDROCHLOROTHIAZIDE 25 MG PO TABS: 25.0000 mg | ORAL_TABLET | Freq: Every day | ORAL | Status: DC

## 2012-12-28 MED ORDER — IMIPRAMINE HCL 25 MG PO TABS: ORAL_TABLET | ORAL | Status: DC

## 2012-12-30 DIAGNOSIS — R7302 Impaired glucose tolerance (oral): Secondary | ICD-10-CM | POA: Insufficient documentation

## 2012-12-30 DIAGNOSIS — IMO0001 Reserved for inherently not codable concepts without codable children: Secondary | ICD-10-CM | POA: Insufficient documentation

## 2012-12-30 NOTE — Assessment & Plan Note (Signed)
Elevated LDL, uncontrolled Hyperlipidemia:Low fat diet discussed and encouraged.  Updated lab next visit

## 2012-12-30 NOTE — Assessment & Plan Note (Signed)
Controlled, no change in medication  

## 2012-12-30 NOTE — Assessment & Plan Note (Signed)
Controlled, no change in medication DASH diet and commitment to daily physical activity for a minimum of 30 minutes discussed and encouraged, as a part of hypertension management. The importance of attaining a healthy weight is also discussed.  

## 2012-12-30 NOTE — Assessment & Plan Note (Signed)
Improved with retirement, discontinue citalopram

## 2012-12-30 NOTE — Assessment & Plan Note (Signed)
Deteriorated. Patient re-educated about  the importance of commitment to a  minimum of 150 minutes of exercise per week. The importance of healthy food choices with portion control discussed. Encouraged to start a food diary, count calories and to consider  joining a support group. Sample diet sheets offered. Goals set by the patient for the next several months.    

## 2012-12-30 NOTE — Assessment & Plan Note (Signed)
deteriorated with increase in HBa1C Patient educated about the importance of limiting  Carbohydrate intake , the need to commit to daily physical activity for a minimum of 30 minutes , and to commit weight loss. The fact that changes in all these areas will reduce or eliminate all together the development of diabetes is stressed.

## 2012-12-30 NOTE — Assessment & Plan Note (Signed)
Acute flare of neck pain and spasm, tylenol and muscle relaxant, declines PT at this time

## 2012-12-30 NOTE — Assessment & Plan Note (Signed)
Uncontrolled with poor grip, eval by hand surgeon requested

## 2013-02-01 ENCOUNTER — Encounter: Payer: Self-pay | Admitting: Family Medicine

## 2013-03-01 ENCOUNTER — Other Ambulatory Visit: Payer: Self-pay | Admitting: Family Medicine

## 2013-03-20 ENCOUNTER — Telehealth: Payer: Self-pay | Admitting: Family Medicine

## 2013-03-20 NOTE — Telephone Encounter (Signed)
Please advise 

## 2013-03-20 NOTE — Telephone Encounter (Signed)
Pt last here in Feb pls document furhter

## 2013-03-20 NOTE — Telephone Encounter (Signed)
Patient will come in to see Dr. Jeanice Lim 5/22

## 2013-03-21 ENCOUNTER — Encounter: Payer: Self-pay | Admitting: Family Medicine

## 2013-03-21 ENCOUNTER — Other Ambulatory Visit: Payer: Self-pay | Admitting: Family Medicine

## 2013-03-21 ENCOUNTER — Ambulatory Visit (INDEPENDENT_AMBULATORY_CARE_PROVIDER_SITE_OTHER): Payer: BC Managed Care – PPO | Admitting: Family Medicine

## 2013-03-21 VITALS — BP 120/88 | HR 77 | Resp 16 | Ht 61.0 in | Wt 190.4 lb

## 2013-03-21 DIAGNOSIS — E785 Hyperlipidemia, unspecified: Secondary | ICD-10-CM

## 2013-03-21 DIAGNOSIS — R7309 Other abnormal glucose: Secondary | ICD-10-CM

## 2013-03-21 DIAGNOSIS — R7301 Impaired fasting glucose: Secondary | ICD-10-CM

## 2013-03-21 DIAGNOSIS — E669 Obesity, unspecified: Secondary | ICD-10-CM

## 2013-03-21 DIAGNOSIS — R7302 Impaired glucose tolerance (oral): Secondary | ICD-10-CM

## 2013-03-21 DIAGNOSIS — R5381 Other malaise: Secondary | ICD-10-CM

## 2013-03-21 DIAGNOSIS — E8881 Metabolic syndrome: Secondary | ICD-10-CM

## 2013-03-21 DIAGNOSIS — M940 Chondrocostal junction syndrome [Tietze]: Secondary | ICD-10-CM

## 2013-03-21 DIAGNOSIS — E890 Postprocedural hypothyroidism: Secondary | ICD-10-CM

## 2013-03-21 DIAGNOSIS — I10 Essential (primary) hypertension: Secondary | ICD-10-CM

## 2013-03-21 LAB — COMPREHENSIVE METABOLIC PANEL
AST: 22 U/L (ref 0–37)
Alkaline Phosphatase: 71 U/L (ref 39–117)
BUN: 15 mg/dL (ref 6–23)
Calcium: 9.5 mg/dL (ref 8.4–10.5)
Chloride: 103 mEq/L (ref 96–112)
Creat: 1.04 mg/dL (ref 0.50–1.10)

## 2013-03-21 LAB — CBC WITH DIFFERENTIAL/PLATELET
Basophils Relative: 0 % (ref 0–1)
Eosinophils Absolute: 0.1 10*3/uL (ref 0.0–0.7)
MCH: 27.5 pg (ref 26.0–34.0)
MCHC: 32.4 g/dL (ref 30.0–36.0)
Neutrophils Relative %: 44 % (ref 43–77)
Platelets: 286 10*3/uL (ref 150–400)
RDW: 13.4 % (ref 11.5–15.5)

## 2013-03-21 LAB — LIPID PANEL
HDL: 43 mg/dL (ref >39)
Total CHOL/HDL Ratio: 4 Ratio

## 2013-03-21 LAB — HEMOGLOBIN A1C: Hgb A1c MFr Bld: 5.7 % — ABNORMAL HIGH (ref <5.7)

## 2013-03-21 MED ORDER — IMIPRAMINE HCL 25 MG PO TABS: ORAL_TABLET | ORAL | Status: DC

## 2013-03-21 MED ORDER — METHYLPREDNISOLONE ACETATE 80 MG/ML IJ SUSP
80.0000 mg | Freq: Once | INTRAMUSCULAR | Status: AC
  Administered 2013-03-21: 80 mg via INTRAMUSCULAR

## 2013-03-21 MED ORDER — TIZANIDINE HCL 2 MG PO CAPS: ORAL_CAPSULE | ORAL | Status: AC

## 2013-03-21 MED ORDER — PREDNISONE 5 MG PO TABS: 5.0000 mg | ORAL_TABLET | Freq: Two times a day (BID) | ORAL | Status: DC

## 2013-03-21 NOTE — Progress Notes (Signed)
  Subjective:    Patient ID: [name], female    DOB: 09/23/1951, 62 y.o.   MRN: [id number]  HPI 2 day h/o acute left chest pain, aggravated by left shoulder and upper body movement as well as direct pressure. Pin started hen pt was moving furniture.No nausea, diaphoresis or light headedness The PT is also  here for follow up and re-evaluation of chronic medical conditions, medication management and review of any available recent lab and radiology data.  Preventive health is updated, specifically  Cancer screening and Immunization.   Questions or concerns regarding consultations or procedures which the PT has had in the interim are  addressed. The PT denies any adverse reactions to current medications since the last visit. She is aware of weight gan, is surprised at the amt, reports regulary exercise, but increased snacking since retiring 5 months ago      Review of Systems See HPI Denies recent fever or chills. Denies sinus pressure, nasal congestion, ear pain or sore throat. Denies chest congestion, productive cough or wheezing. Denies  palpitations and leg swelling Denies abdominal pain, nausea, vomiting,diarrhea or constipation.   Denies dysuria, frequency, hesitancy or incontinence. C/o back pain and generalized joint pains Denies headaches, seizures, numbness, or tingling. Denies depression, anxiety or insomnia. Denies skin break down or rash.        Objective:   Physical Exam Patient alert and oriented and in no cardiopulmonary distress.  HEENT: No facial asymmetry, EOMI, no sinus tenderness,  oropharynx pink and moist.  Neck supple no adenopathy.  Chest: Clear to auscultation bilaterally.Tender over 3rd and 4th CC junctions  CVS: S1, S2 no murmurs, no S3.  ABD: Soft non tender. Bowel sounds normal.  Ext: No edema  MS: Adequate ROM spine, shoulders, hips and knees.  Skin: Intact, no ulcerations or rash noted.  Psych: Good eye contact, normal affect. Memory  intact not anxious or depressed appearing.  CNS: CN 2-12 intact, power, tone and sensation normal throughout.         Assessment & Plan:

## 2013-03-21 NOTE — Patient Instructions (Addendum)
F/u as before.  Depomedrol in office for chest wall pain due to sprained muscle, followed by prednisone and muscle relaxant at bedtime  PLEASE attend diabetic group session, you will get information on this.  PLEASE stop drinking sweetended drinks  You have gained 16 pounds in 6 months, need to lose weight  When you lose weight back and other pain will lessen, and you will be healthier  It is important that you exercise regularly at least 30 minutes 5 times a week. If you develop chest pain, have severe difficulty breathing, or feel very tired, stop exercising immediately and seek medical attention    Labs today, cbc, lipid, cmp, HBA1C

## 2013-03-22 LAB — IRON: Iron: 90 ug/dL (ref 42–145)

## 2013-03-25 DIAGNOSIS — E8881 Metabolic syndrome: Secondary | ICD-10-CM | POA: Insufficient documentation

## 2013-03-25 NOTE — Assessment & Plan Note (Signed)
Deteriorated. Patient re-educated about  the importance of commitment to a  minimum of 150 minutes of exercise per week. The importance of healthy food choices with portion control discussed. Encouraged to start a food diary, count calories and to consider  joining a support group. Sample diet sheets offered. Goals set by the patient for the next several months.    

## 2013-03-25 NOTE — Assessment & Plan Note (Signed)
Hyperlipidemia:Low fat diet discussed and encouraged.  No change in med, LDL slightly elevated

## 2013-03-25 NOTE — Assessment & Plan Note (Signed)
Pt is at increased CV risk and is made aware of this

## 2013-03-25 NOTE — Assessment & Plan Note (Signed)
Anti inflammatory in office and prescribed

## 2013-03-25 NOTE — Assessment & Plan Note (Signed)
Controlled, no change in medication DASH diet and commitment to daily physical activity for a minimum of 30 minutes discussed and encouraged, as a part of hypertension management. The importance of attaining a healthy weight is also discussed.  

## 2013-03-25 NOTE — Assessment & Plan Note (Signed)
Followed by endo.  

## 2013-03-25 NOTE — Assessment & Plan Note (Signed)
Slightly improved, but excessive weight gain, pt to continue exercise routine, and reduce caloric intake

## 2013-04-09 ENCOUNTER — Other Ambulatory Visit: Payer: Self-pay

## 2013-04-09 MED ORDER — LOVASTATIN 40 MG PO TABS: ORAL_TABLET | ORAL | Status: DC

## 2013-04-19 LAB — COMPREHENSIVE METABOLIC PANEL
BUN: 13 mg/dL (ref 6–23)
CO2: 32 mEq/L (ref 19–32)
Creat: 1.26 mg/dL — ABNORMAL HIGH (ref 0.50–1.10)
Glucose, Bld: 96 mg/dL (ref 70–99)
Sodium: 137 mEq/L (ref 135–145)
Total Bilirubin: 0.4 mg/dL (ref 0.3–1.2)
Total Protein: 7.4 g/dL (ref 6.0–8.3)

## 2013-04-19 LAB — HEMOGLOBIN A1C: Hgb A1c MFr Bld: 5.5 % (ref <5.7)

## 2013-04-19 LAB — CBC WITH DIFFERENTIAL/PLATELET
Eosinophils Relative: 3 % (ref 0–5)
Hemoglobin: 11 g/dL — ABNORMAL LOW (ref 12.0–15.0)
Lymphocytes Relative: 39 % (ref 12–46)
Lymphs Abs: 1.7 10*3/uL (ref 0.7–4.0)
MCV: 83.2 fL (ref 78.0–100.0)
Monocytes Relative: 7 % (ref 3–12)
Neutrophils Relative %: 50 % (ref 43–77)
Platelets: 275 10*3/uL (ref 150–400)
RBC: 4.05 MIL/uL (ref 3.87–5.11)
WBC: 4.4 10*3/uL (ref 4.0–10.5)

## 2013-04-19 LAB — LIPID PANEL
Cholesterol: 184 mg/dL (ref 0–200)
HDL: 51 mg/dL (ref >39)
Total CHOL/HDL Ratio: 3.6 Ratio
Triglycerides: 87 mg/dL (ref <150)

## 2013-05-06 ENCOUNTER — Telehealth: Payer: Self-pay | Admitting: Family Medicine

## 2013-05-06 ENCOUNTER — Encounter: Payer: Self-pay | Admitting: Family Medicine

## 2013-05-06 ENCOUNTER — Ambulatory Visit (INDEPENDENT_AMBULATORY_CARE_PROVIDER_SITE_OTHER): Payer: BC Managed Care – PPO | Admitting: Family Medicine

## 2013-05-06 VITALS — BP 120/84 | HR 73 | Resp 16 | Ht 61.0 in | Wt 189.0 lb

## 2013-05-06 DIAGNOSIS — E8881 Metabolic syndrome: Secondary | ICD-10-CM

## 2013-05-06 DIAGNOSIS — Z1211 Encounter for screening for malignant neoplasm of colon: Secondary | ICD-10-CM

## 2013-05-06 DIAGNOSIS — Z1212 Encounter for screening for malignant neoplasm of rectum: Secondary | ICD-10-CM

## 2013-05-06 DIAGNOSIS — E785 Hyperlipidemia, unspecified: Secondary | ICD-10-CM

## 2013-05-06 DIAGNOSIS — R32 Unspecified urinary incontinence: Secondary | ICD-10-CM

## 2013-05-06 DIAGNOSIS — R002 Palpitations: Secondary | ICD-10-CM

## 2013-05-06 DIAGNOSIS — N390 Urinary tract infection, site not specified: Secondary | ICD-10-CM | POA: Insufficient documentation

## 2013-05-06 LAB — POCT URINALYSIS DIPSTICK
Glucose, UA: NEGATIVE
Ketones, UA: NEGATIVE
Spec Grav, UA: 1.015

## 2013-05-06 LAB — POC HEMOCCULT BLD/STL (OFFICE/1-CARD/DIAGNOSTIC): Fecal Occult Blood, POC: NEGATIVE

## 2013-05-06 MED ORDER — IMIPRAMINE HCL 25 MG PO TABS: ORAL_TABLET | ORAL | Status: DC

## 2013-05-06 MED ORDER — PANTOPRAZOLE SODIUM 40 MG PO TBEC: DELAYED_RELEASE_TABLET | ORAL | Status: DC

## 2013-05-06 MED ORDER — CIPROFLOXACIN HCL 500 MG PO TABS: 500.0000 mg | ORAL_TABLET | Freq: Two times a day (BID) | ORAL | Status: AC

## 2013-05-06 MED ORDER — OXYBUTYNIN CHLORIDE 5 MG PO TABS: 5.0000 mg | ORAL_TABLET | Freq: Two times a day (BID) | ORAL | Status: DC

## 2013-05-06 MED ORDER — HYDROCHLOROTHIAZIDE 25 MG PO TABS: 25.0000 mg | ORAL_TABLET | Freq: Every day | ORAL | Status: DC

## 2013-05-06 NOTE — Patient Instructions (Addendum)
F/u in 4. 5 month, call if you need me before  No evidence of bladder prolapse on exam (fallen bladder)  You appear to have a urinary infection, ciprofloxacin is prescribed, you will be contacted if necessary when culture report i available  You are referred to cardiology re new onset palpitations, and you will have an EKG today.  New medication for urinary incontinence is oxybutynin twice daily  Please continue to reduce sugar intake and exercise daily to promiote improved health and weight loss  Recatl exam today

## 2013-05-06 NOTE — Progress Notes (Signed)
  Subjective:    Patient ID: Laura [name], female    DOB: 02-27-1951, 62 y.o.   MRN: [id number]  HPI 2 week h/o suprapubic pressure with frequency, denies fever or chills , no flank pain. Has been walking a lot and concerned that bladder has fallen Ethmoid sinus pressure since yesterday, no drainage, no sore throat or productive cough States she experienced excessive sweating and palpitations yesterday after attending a funeral, felt as though she would pass out.Duration for 1 hour Had palpitations in the past years ago, mild chest discomfort intermittently with r without physical activity. CV risk includes metabolic synd X C/o worsening of urinary incontinence wants  A trial of medication for this again   Review of Systems See HPI Denies recent fever or chills. Denies sinus pressure, nasal congestion, ear pain or sore throat. Denies chest congestion, productive cough or wheezing.  Denies abdominal pain, nausea, vomiting,diarrhea or constipation.    Denies joint pain, swelling and limitation in mobility. Denies headaches, seizures, numbness, or tingling. Denies depression, anxiety or insomnia. Denies skin break down or rash.        Objective:   Physical Exam  Patient alert and oriented and in no cardiopulmonary distress.  HEENT: No facial asymmetry, EOMI, no sinus tenderness,  oropharynx pink and moist.  Neck supple no adenopathy.  Chest: Clear to auscultation bilaterally.  CVS: S1, S2 no murmurs, no S3.  ABD: Soft mild suprapubic tenderness, no renal angle tenderness, no guarding or rebound.. Bowel sounds normal.No organomegaly or mass Rectal:  heme negative stool  Ext: No edema  MS: Adequate ROM spine, shoulders, hips and knees.  Skin: Intact, no ulcerations or rash noted.  Psych: Good eye contact, normal affect. Memory intact not anxious or depressed appearing.  CNS: CN 2-12 intact, power, tone and sensation normal throughout.       Assessment & Plan:

## 2013-05-07 NOTE — Telephone Encounter (Signed)
Patient is aware 

## 2013-05-10 LAB — URINE CULTURE: Colony Count: >=100000

## 2013-05-12 DIAGNOSIS — R32 Unspecified urinary incontinence: Secondary | ICD-10-CM | POA: Insufficient documentation

## 2013-05-12 NOTE — Assessment & Plan Note (Signed)
Link to CV disease explianed, hence the need to change lifestyle to reduce risk

## 2013-05-12 NOTE — Assessment & Plan Note (Signed)
LDL elevated Hyperlipidemia:Low fat diet discussed and encouraged.  No med change

## 2013-05-12 NOTE — Assessment & Plan Note (Signed)
New onset symptomatic palpitations, EKG in office shows sinus rythm with possible old infarct , will refer for card eval

## 2013-05-12 NOTE — Assessment & Plan Note (Signed)
Symptomatic with abn Ua, treeat prsumptively and f/u c/s Genital exam negative for prolapse

## 2013-05-12 NOTE — Assessment & Plan Note (Signed)
Long history, now worsened. Resume med for same

## 2013-05-27 ENCOUNTER — Telehealth: Payer: Self-pay | Admitting: Family Medicine

## 2013-05-27 ENCOUNTER — Ambulatory Visit (INDEPENDENT_AMBULATORY_CARE_PROVIDER_SITE_OTHER): Payer: BC Managed Care – PPO | Admitting: Family Medicine

## 2013-05-27 VITALS — BP 120/72 | HR 78 | Resp 16 | Wt 192.4 lb

## 2013-05-27 DIAGNOSIS — R1900 Intra-abdominal and pelvic swelling, mass and lump, unspecified site: Secondary | ICD-10-CM | POA: Insufficient documentation

## 2013-05-27 DIAGNOSIS — R32 Unspecified urinary incontinence: Secondary | ICD-10-CM

## 2013-05-27 DIAGNOSIS — N3090 Cystitis, unspecified without hematuria: Secondary | ICD-10-CM | POA: Insufficient documentation

## 2013-05-27 DIAGNOSIS — N390 Urinary tract infection, site not specified: Secondary | ICD-10-CM

## 2013-05-27 DIAGNOSIS — I10 Essential (primary) hypertension: Secondary | ICD-10-CM

## 2013-05-27 LAB — POCT URINALYSIS DIPSTICK
Glucose, UA: NEGATIVE
Ketones, UA: NEGATIVE
Spec Grav, UA: 1.02
Urobilinogen, UA: 0.2

## 2013-05-27 MED ORDER — PHENAZOPYRIDINE HCL 95 MG PO TABS: 95.0000 mg | ORAL_TABLET | Freq: Three times a day (TID) | ORAL | Status: DC | PRN

## 2013-05-27 MED ORDER — CIPROFLOXACIN HCL 500 MG PO TABS: 500.0000 mg | ORAL_TABLET | Freq: Two times a day (BID) | ORAL | Status: DC

## 2013-05-27 NOTE — Progress Notes (Signed)
  Subjective:    Patient ID: Laura [name], female    DOB: 04-12-1951, 62 y.o.   MRN: [id number]  HPI Frequency, pressure and incontinence since yesterday, No fever, chills or flank pain, Recovered form recent UTI with treatment but symptoms have recurred within 2 weeks. Pt in agony, poor sleep, no comfortable posiition, doubled over in pain at times   Review of Systems See HPI Denies recent fever or chills. Denies sinus pressure, nasal congestion, ear pain or sore throat. Denies chest congestion, productive cough or wheezing. Denies chest pains, palpitations and leg swelling Denies  vomiting,diarrhea or constipation.    Denies skin break down or rash.        Objective:   Physical Exam Patient alert and oriented and in no cardiopulmonary distress.pt in pain  HEENT: No facial asymmetry, EOMI, no sinus tenderness,  oropharynx pink and moist.  Neck supple no adenopathy.  Chest: Clear to auscultation bilaterally.  CVS: S1, S2 no murmurs, no S3.  ABD: Soft suprapubic tenderness and fulness, no renal angle tenderness. No guarding or rebound, Normal bowel sounds Pelvic; introitus normal, I/O catheterization done with copious amt of clear urine immediately post void Ext: No edema  MS: Adequate ROM spine, shoulders, hips and knees.  Skin: Intact, no ulcerations or rash noted.  Psych: Good eye contact, normal affect. Memory intact  anxious not depressed appearing.  CNS: CN 2-12 intact, power, tone and sensation normal throughout.        Assessment & Plan:  Frequency, pressure and incontinence since yesterday

## 2013-05-27 NOTE — Telephone Encounter (Signed)
Patient is aware 

## 2013-05-27 NOTE — Patient Instructions (Addendum)
F/u as before, call if you need  Before this  You are symptomatic for a UTI, I will start antibiotic and call in the next 2 to 3 days with result of culture.  You will start medication for bladder spasm also , take for 3 days, if needed, for bladder spasm  You are referred to Dr Jerre Simon for further evaluation, and are referred for pelvic US  Stop ditropan  at this time, and ensure you drink 64 ounces ounces water daily and empty regularly

## 2013-05-28 ENCOUNTER — Ambulatory Visit (HOSPITAL_COMMUNITY)
Admission: RE | Admit: 2013-05-28 | Discharge: 2013-05-28 | Disposition: A | Discharge status: Home / Self Care | Payer: BC Managed Care – PPO | Source: Ambulatory Visit | Attending: Family Medicine | Admitting: Family Medicine

## 2013-05-28 ENCOUNTER — Ambulatory Visit (HOSPITAL_COMMUNITY): Payer: BC Managed Care – PPO

## 2013-05-28 DIAGNOSIS — Z90710 Acquired absence of both cervix and uterus: Secondary | ICD-10-CM | POA: Insufficient documentation

## 2013-05-28 DIAGNOSIS — R1900 Intra-abdominal and pelvic swelling, mass and lump, unspecified site: Secondary | ICD-10-CM

## 2013-05-28 DIAGNOSIS — N949 Unspecified condition associated with female genital organs and menstrual cycle: Secondary | ICD-10-CM | POA: Insufficient documentation

## 2013-05-28 IMAGING — US US PELVIS COMPLETE
1 series · 14 of 21 positions shown · non-contrast
Comparison: CT of the abdomen and pelvis [DATE]

CLINICAL DATA: Pelvic pain.  Previous hysterectomy.



[Series 1: us pelvis complete · 0.25mm/px · 14 of 21 slices shown]
[im 1/21]
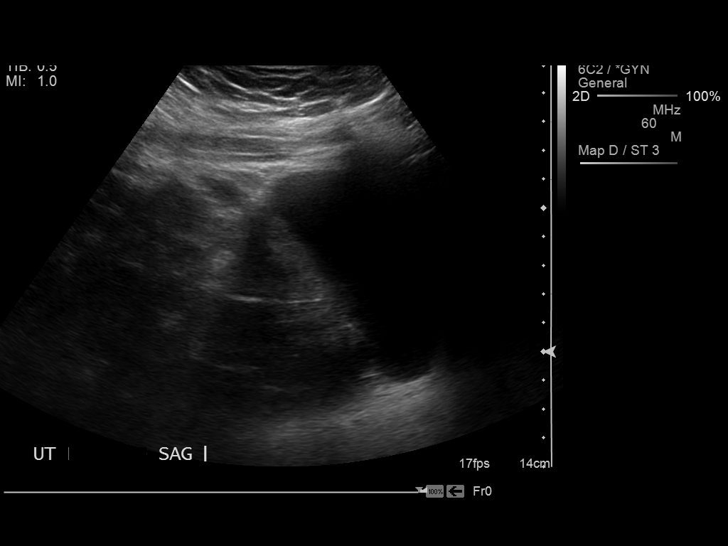
[im 3/21]
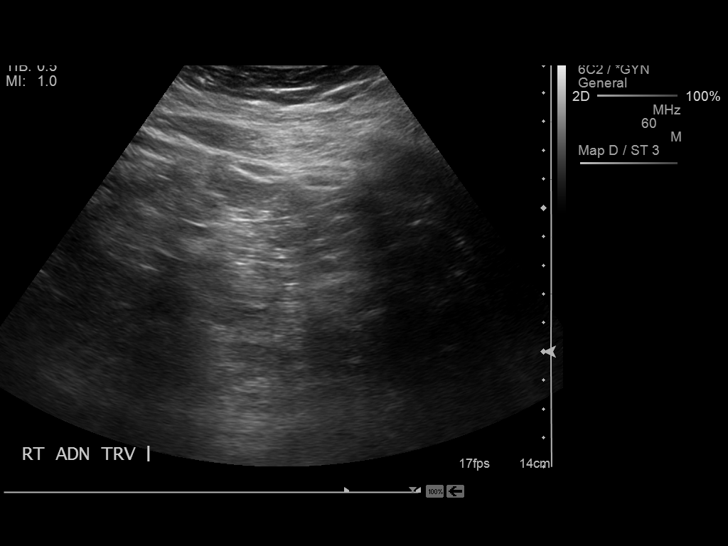
[im 4/21]
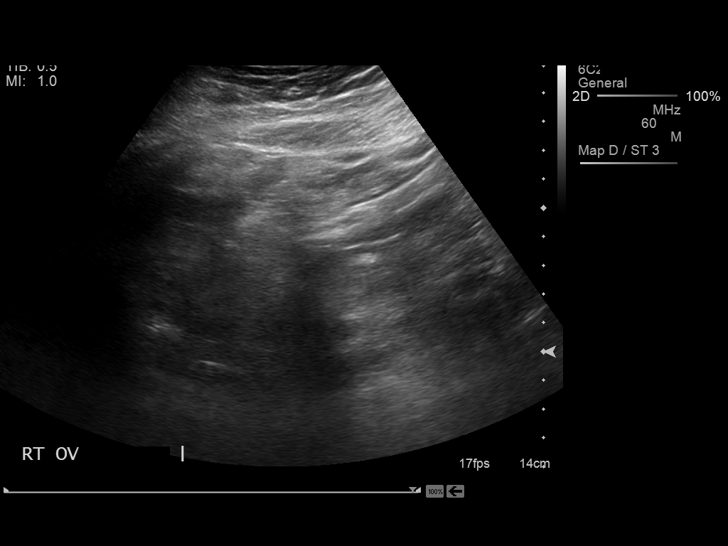
[im 6/21]
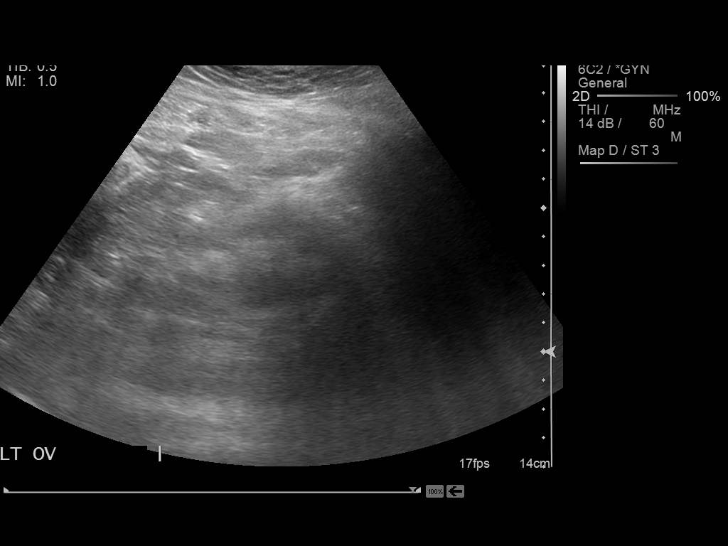
[im 7/21]
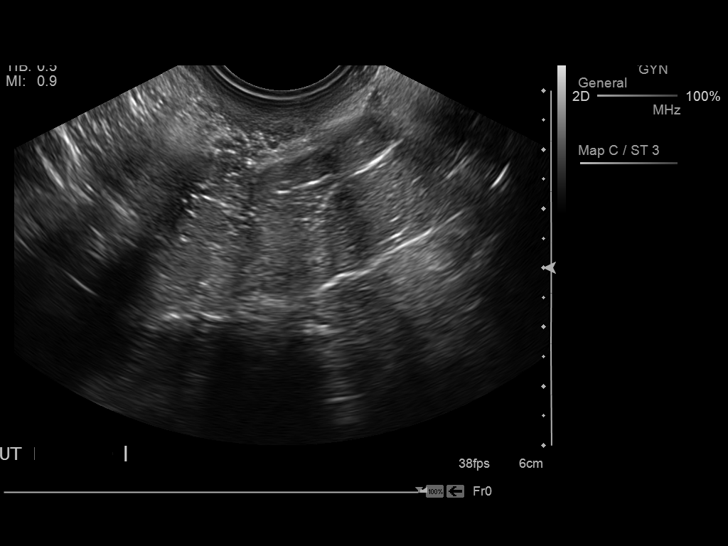
[im 9/21]
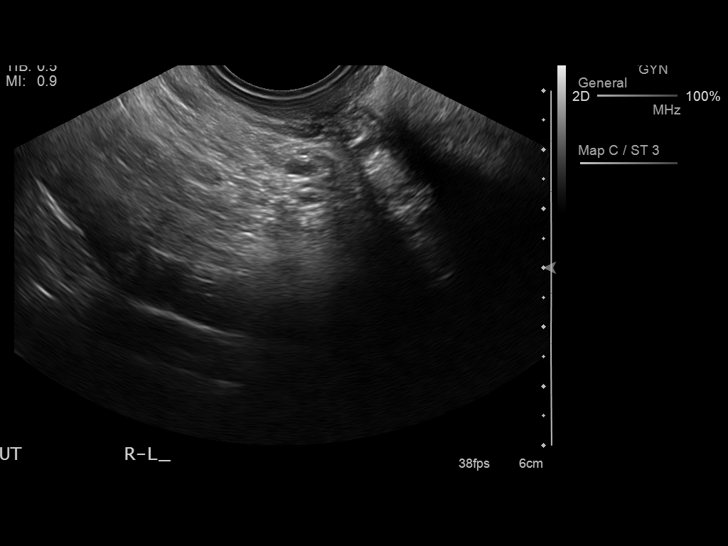
[im 10/21]
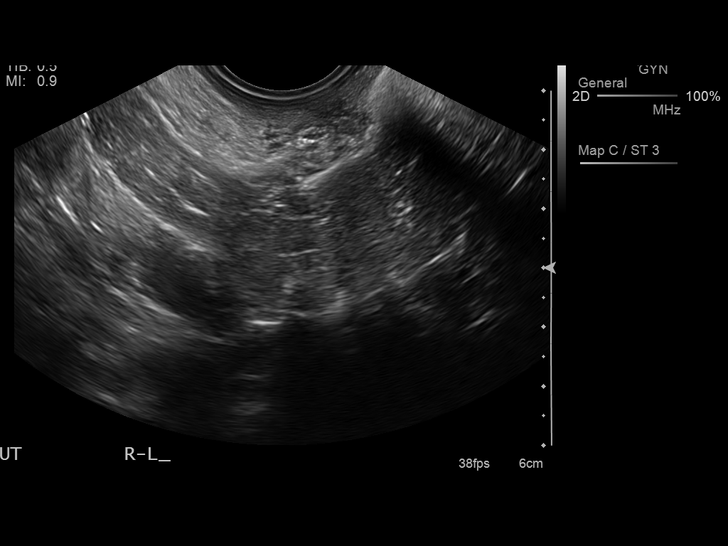
[im 12/21]
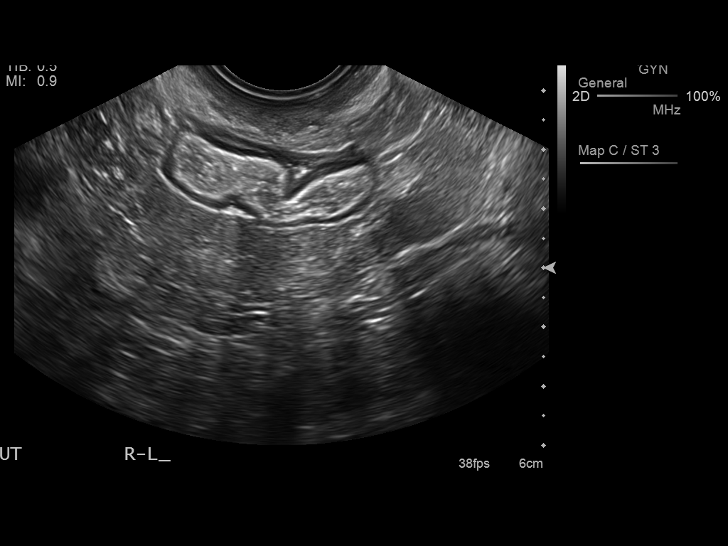
[im 13/21]
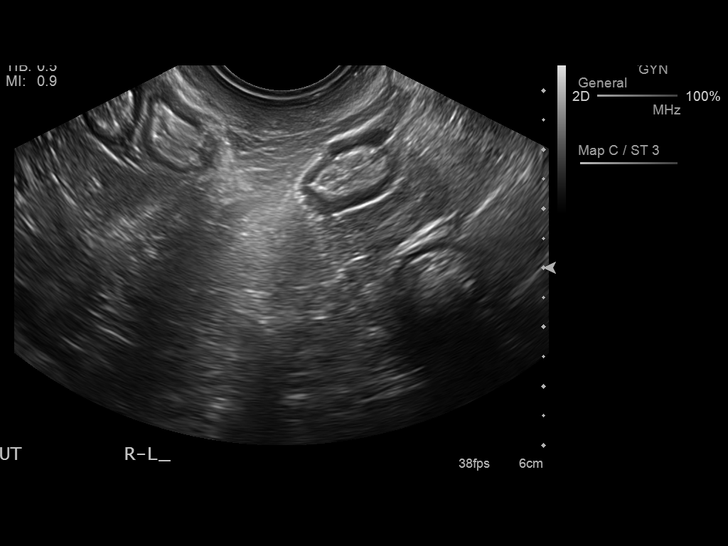
[im 15/21]
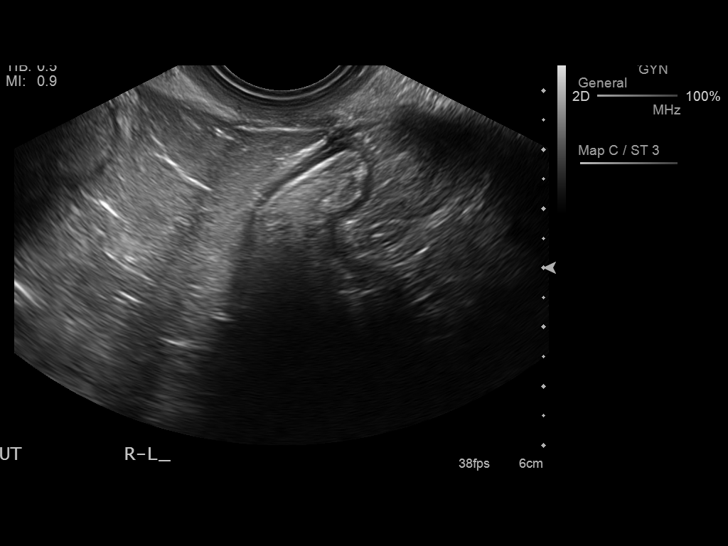
[im 16/21]
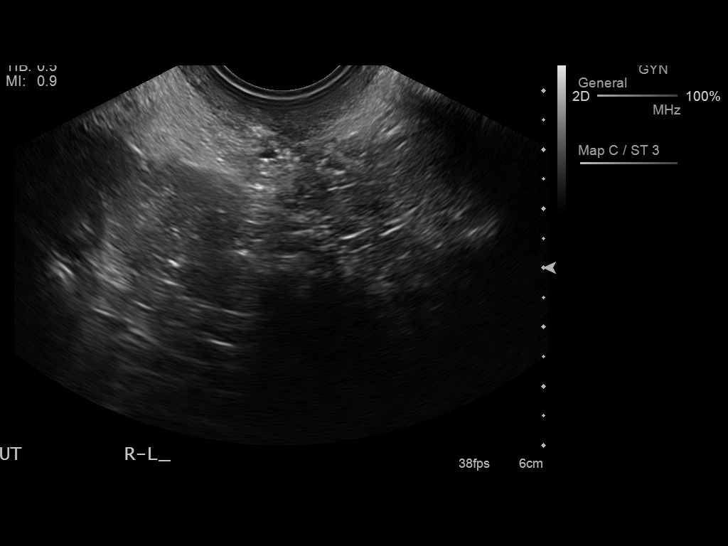
[im 18/21]
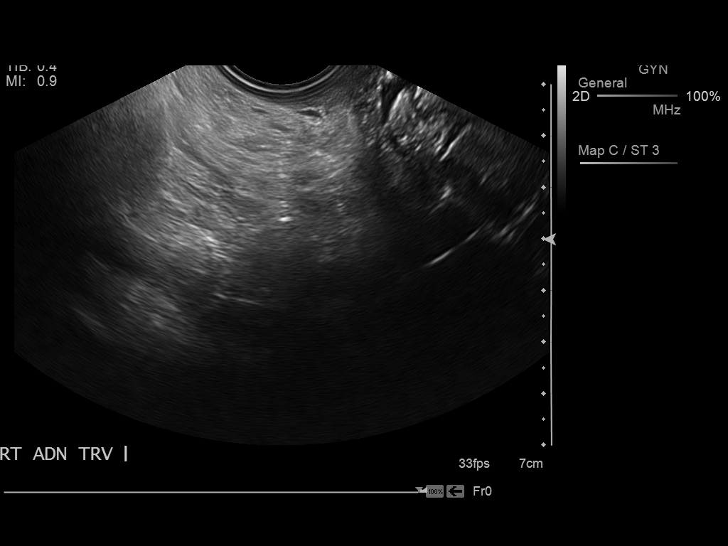
[im 19/21]
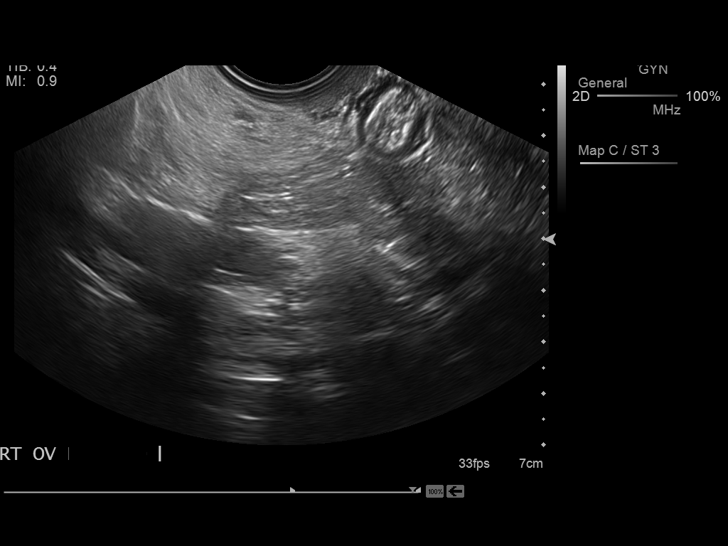
[im 21/21]
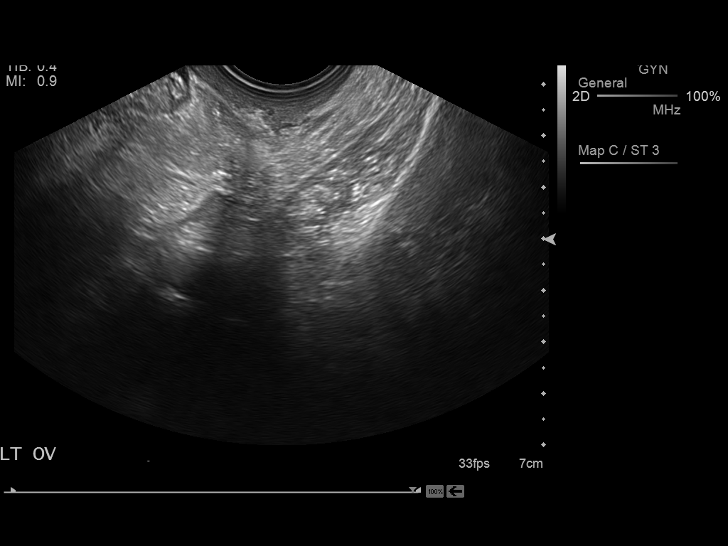

[14 of 21 positions shown; findings below may reference images not displayed]

FINDINGS: Uterus: Uterus is absent. No abnormality of the vaginal apex.

Endometrium: Uterus is absent.

Right ovary:  By history, the right ovary is absent. No adnexal
mass visualized.

Left ovary: By history, the left ovary is absent.  No adnexal mass
visualized.

Other findings: No free fluid
IMPRESSION: 1.  Status post hysterectomy and bilateral oophorectomy.
2.  Normal appearance of the vaginal apex.
3.  No adnexal mass or free pelvic fluid..

## 2013-05-29 LAB — URINE CULTURE
Colony Count: NO GROWTH
Organism ID, Bacteria: NO GROWTH

## 2013-06-02 ENCOUNTER — Encounter: Payer: Self-pay | Admitting: Family Medicine

## 2013-06-02 NOTE — Assessment & Plan Note (Signed)
Tender pelvic mass transabdominally, possibly full bladder post incomplete void, will refer for [name] as  Well as for urology eval I/O cath after voiding yeilded a large volume of clear urine

## 2013-06-02 NOTE — Assessment & Plan Note (Signed)
Due to seeming incomplete emptying of bladder with frequency  And pelvic pain, I advised pt to stop meds at this time

## 2013-06-02 NOTE — Assessment & Plan Note (Signed)
Controlled, no change in medication  

## 2013-06-02 NOTE — Assessment & Plan Note (Signed)
Based on symptoms prescribed pyridium and antibioitc, will d/c antibiotic if c/s is negative, pt understands

## 2013-06-04 ENCOUNTER — Other Ambulatory Visit: Payer: Self-pay | Admitting: Family Medicine

## 2013-06-04 ENCOUNTER — Telehealth: Payer: Self-pay | Admitting: Family Medicine

## 2013-06-04 MED ORDER — FLUCONAZOLE 150 MG PO TABS: ORAL_TABLET | ORAL | Status: AC

## 2013-06-04 NOTE — Telephone Encounter (Signed)
Fluconazole sent in pls let her know

## 2013-06-04 NOTE — Telephone Encounter (Signed)
Aware to check with the pharmacy later  

## 2013-06-04 NOTE — Telephone Encounter (Signed)
Has some irritation around her vagina. Thinks she has a yeast infection and wants something called in for it. Won't use monistat.   [facility]

## 2013-06-04 NOTE — Telephone Encounter (Signed)
Called patient and left message for them to return call at the office   

## 2013-06-05 ENCOUNTER — Ambulatory Visit (INDEPENDENT_AMBULATORY_CARE_PROVIDER_SITE_OTHER): Payer: BC Managed Care – PPO | Admitting: Cardiovascular Disease

## 2013-06-05 ENCOUNTER — Encounter: Payer: Self-pay | Admitting: Cardiovascular Disease

## 2013-06-05 VITALS — BP 125/78 | HR 83 | Ht 64.0 in | Wt 193.0 lb

## 2013-06-05 DIAGNOSIS — R002 Palpitations: Secondary | ICD-10-CM

## 2013-06-05 NOTE — Telephone Encounter (Signed)
nioted and I am, aware of this,

## 2013-06-05 NOTE — Progress Notes (Signed)
Patient ID: Laura [name], female   DOB: 01-Aug-1951, 62 y.o.   MRN: [id number]    CARDIOLOGY CONSULT NOTE  Patient ID: Laura [name] MRN: [id number] DOB/AGE: February 10, 1951 62 y.o.  Admit date: (Not on file) Primary Physician Reason for Consultation  HPI:  Mrs. Eckstrom is a 62 y.o. Woman with a PMH significant for HTN, hypothyroidism, and hyperlipidemia. On July 6th, she was driving and she broke out in a sweat. By the time she got home, she had palpitations which she said "were very hard and lasted 30 minutes".  She's never experienced them before. She denies associated chest pain, shortness of breath, and lightheadedness. She lied down and fell asleep and they haven't recurred since. She denies syncope altogether.  She'd like to start walking again. She denies any stressors as well, as she's retired.  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 She denies smoking and alcohol use.   Family History  Problem Relation Age of Onset  . Colon cancer Neg Hx   . Anesthesia problems Neg Hx   . Hypotension Neg Hx   . Malignant hyperthermia Neg Hx   . Pseudochol deficiency Neg Hx     History   Social History  . Marital Status: Married    Spouse Name: N/A    Number of Children: 3  . Years of Education: N/A   Occupational History  . 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 Smoking status: Former Smoker -- 0.50 packs/day    Types: Cigarettes    Quit date: 08/25/1953  . Smokeless tobacco: Not on file  . Alcohol Use: No  . Drug Use: No  . Sexually Active: Not on file   Other Topics Concern  . Not on file   Social History Narrative  . No narrative on file      (Not in a hospital admission)  Physical exam Blood pressure 125/78, pulse 83, height 5\' 4"  (1.626 m), weight 193 lb (87.544 kg). General:  NAD Neck: No JVD, no thyromegaly or thyroid nodule.  Lungs: Clear to auscultation bilaterally with normal respiratory effort. CV: Nondisplaced PMI.  Heart regular S1/S2, no S3/S4, no murmur.  No peripheral edema.  No carotid bruit.  Normal pedal pulses.  Abdomen: Soft, nontender, no hepatosplenomegaly, no distention.  Skin: Intact without lesions or rashes.  Neurologic: Alert and oriented x 3.  Psych: Normal affect. Extremities: No clubbing or cyanosis.  HEENT: Normal.   Labs:   Lab Results  Component Value Date   WBC 4.4 04/19/2013   HGB 11.0* 04/19/2013   HCT 33.7* 04/19/2013   MCV 83.2 04/19/2013   PLT 275 04/19/2013   No results found for this basename: NA, K, CL, CO2, BUN, CREATININE, CALCIUM, LABALBU, PROT, BILITOT, ALKPHOS, ALT, AST, GLUCOSE,  in the last 168 hours Lab Results  Component Value Date   TROPONINI 0.04 07/01/2010    Lab Results  Component Value Date   CHOL 184 04/19/2013   CHOL 174 03/21/2013   CHOL 188 08/28/2012   Lab Results  Component Value Date   HDL 51 04/19/2013   HDL 43 7/82/9562   HDL 45 13/05/6577   Lab Results  Component Value Date   LDLCALC 116* 04/19/2013   LDLCALC 100* 03/21/2013   LDLCALC 112* 08/28/2012   Lab  Results  Component Value Date   TRIG 87 04/19/2013   TRIG 154* 03/21/2013   TRIG 156* 08/28/2012   Lab Results  Component Value Date   CHOLHDL 3.6 04/19/2013   CHOLHDL 4.0 03/21/2013   CHOLHDL 4.2 08/28/2012   No results found for this basename: LDLDIRECT         ECG: normal sinus rhythm, non-diagnostic Q waves inferiorly   ASSESSMENT AND PLAN:  1. Palpitations: at this point, she's only had one episode. I will obtain an echocardiogram to evaluate for structural heart disease. I discussed different strategies at length with the patient. I will not start any medications at this time. If they were to recur, I would pursue a 30-day Event monitor.   Signed: Prentice Docker, M.D., F.A.C.C. 06/05/2013, 1:41 PM

## 2013-06-05 NOTE — Patient Instructions (Addendum)
Your physician recommends that you schedule a follow-up appointment in: AS NEEDED  Your physician has requested that you have an echocardiogram. Echocardiography is a painless test that uses sound waves to create images of your heart. It provides your doctor with information about the size and shape of your heart and how well your heart's chambers and valves are working. This procedure takes approximately one hour. There are no restrictions for this procedure.   

## 2013-06-06 ENCOUNTER — Ambulatory Visit (HOSPITAL_COMMUNITY)
Admission: RE | Admit: 2013-06-06 | Discharge: 2013-06-06 | Disposition: A | Discharge status: Home / Self Care | Payer: BC Managed Care – PPO | Source: Ambulatory Visit | Attending: Cardiovascular Disease | Admitting: Cardiovascular Disease

## 2013-06-06 DIAGNOSIS — I10 Essential (primary) hypertension: Secondary | ICD-10-CM | POA: Insufficient documentation

## 2013-06-06 DIAGNOSIS — R42 Dizziness and giddiness: Secondary | ICD-10-CM | POA: Insufficient documentation

## 2013-06-06 DIAGNOSIS — I369 Nonrheumatic tricuspid valve disorder, unspecified: Secondary | ICD-10-CM

## 2013-06-06 DIAGNOSIS — R002 Palpitations: Secondary | ICD-10-CM | POA: Insufficient documentation

## 2013-06-06 NOTE — Progress Notes (Signed)
*  PRELIMINARY RESULTS* Echocardiogram 2D Echocardiogram has been performed.  [place] 06/06/2013, 8:30 AM

## 2013-06-10 ENCOUNTER — Telehealth: Payer: Self-pay | Admitting: Family Medicine

## 2013-06-10 ENCOUNTER — Other Ambulatory Visit: Payer: Self-pay | Admitting: Family Medicine

## 2013-06-10 NOTE — Telephone Encounter (Signed)
Spoke with pt she appreciated the call states all is well now, also had heart eval

## 2013-06-10 NOTE — Telephone Encounter (Signed)
Please advise 

## 2013-06-10 NOTE — Telephone Encounter (Signed)
Was on oxybutynin 5 mg twice daily, I suggest she resume once daily only pls let her know and I will enter the new reduced dose historically also

## 2013-06-14 NOTE — Telephone Encounter (Signed)
Patient would like to restart at once daily.   She states that she still has supply and will call when she needs a refill.

## 2013-08-06 ENCOUNTER — Other Ambulatory Visit: Payer: Self-pay | Admitting: Family Medicine

## 2013-08-06 DIAGNOSIS — Z139 Encounter for screening, unspecified: Secondary | ICD-10-CM

## 2013-08-14 ENCOUNTER — Encounter (INDEPENDENT_AMBULATORY_CARE_PROVIDER_SITE_OTHER): Payer: Self-pay

## 2013-08-14 ENCOUNTER — Encounter: Payer: Self-pay | Admitting: Family Medicine

## 2013-08-14 ENCOUNTER — Ambulatory Visit (INDEPENDENT_AMBULATORY_CARE_PROVIDER_SITE_OTHER): Payer: BC Managed Care – PPO | Admitting: Family Medicine

## 2013-08-14 VITALS — BP 122/74 | HR 80 | Resp 18 | Ht 61.0 in | Wt 191.0 lb

## 2013-08-14 DIAGNOSIS — N3000 Acute cystitis without hematuria: Secondary | ICD-10-CM

## 2013-08-14 DIAGNOSIS — N390 Urinary tract infection, site not specified: Secondary | ICD-10-CM

## 2013-08-14 DIAGNOSIS — K219 Gastro-esophageal reflux disease without esophagitis: Secondary | ICD-10-CM

## 2013-08-14 DIAGNOSIS — I10 Essential (primary) hypertension: Secondary | ICD-10-CM

## 2013-08-14 DIAGNOSIS — M5432 Sciatica, left side: Secondary | ICD-10-CM

## 2013-08-14 DIAGNOSIS — M5430 Sciatica, unspecified side: Secondary | ICD-10-CM

## 2013-08-14 DIAGNOSIS — E785 Hyperlipidemia, unspecified: Secondary | ICD-10-CM

## 2013-08-14 DIAGNOSIS — R32 Unspecified urinary incontinence: Secondary | ICD-10-CM

## 2013-08-14 DIAGNOSIS — R350 Frequency of micturition: Secondary | ICD-10-CM

## 2013-08-14 DIAGNOSIS — Z23 Encounter for immunization: Secondary | ICD-10-CM

## 2013-08-14 LAB — POCT URINALYSIS DIPSTICK
Bilirubin, UA: NEGATIVE
Blood, UA: NEGATIVE
Nitrite, UA: NEGATIVE
pH, UA: 7

## 2013-08-14 MED ORDER — PREDNISONE 5 MG PO TABS: 5.0000 mg | ORAL_TABLET | Freq: Two times a day (BID) | ORAL | Status: AC

## 2013-08-14 NOTE — Assessment & Plan Note (Signed)
Controlled, no change in medication  

## 2013-08-14 NOTE — Assessment & Plan Note (Signed)
Elevated LDL when last checked. Hyperlipidemia:Low fat diet discussed and encouraged.  Updated lab needed

## 2013-08-14 NOTE — Progress Notes (Signed)
  Subjective:    Patient ID: Laura [name], female    DOB: 26-Sep-1951, 62 y.o.   MRN: [id number]  HPI The PT is here for follow up and re-evaluation of chronic medical conditions, medication management and review of any available recent lab and radiology data.  Preventive health is updated, specifically  Cancer screening and Immunization.   Questions or concerns regarding consultations or procedures which the PT has had in the interim are  Addressed.saw local urologist, no change The PT denies any adverse reactions to current medications since the last visit.  Worsening urinary incontinence esp in the day, wets all the time, no meds have helped, has been on 4 to 5 different ones, knows when she needs to go, no incontinence of stool.No awakenings at night to urinate  3 day h/o left buttock pain and discomfort down posterior left thigh to feet, has had sciatic nerve pain in the past, no specific aggravating factor that caused symptoms to start       Review of Systems See HPI Denies recent fever or chills. Denies sinus pressure, nasal congestion, ear pain or sore throat. Denies chest congestion, productive cough or wheezing. Denies chest pains, palpitations and leg swelling Denies abdominal pain, nausea, vomiting,diarrhea or constipation.   Denies headaches, seizures,has  Tingling down left buttock and left posterior thigh Denies depression, anxiety or insomnia. Denies skin break down or rash.        Objective:   Physical Exam Patient alert and oriented and in no cardiopulmonary distress.  HEENT: No facial asymmetry, EOMI, no sinus tenderness,  oropharynx pink and moist.  Neck supple no adenopathy.  Chest: Clear to auscultation bilaterally.  CVS: S1, S2 no murmurs, no S3.  ABD: Soft non tender. Bowel sounds normal.  Ext: No edema  MS: Adequate ROM spine, shoulders, hips and knees.  Skin: Intact, no ulcerations or rash noted.  Psych: Good eye contact, normal affect.  Memory intact not anxious or depressed appearing.  CNS: CN 2-12 intact, power, tone and sensation normal throughout.        Assessment & Plan:

## 2013-08-14 NOTE — Assessment & Plan Note (Signed)
frequency and urgency, for months, worse in the past 3 days Urinalysis has trace of leukocytes, will send for c/s Needs urology re eval and treatment of incontinence. In the interim , will increase the imipramine dose to twice daily

## 2013-08-14 NOTE — Assessment & Plan Note (Signed)
Progressively worsening disabling urinary incontinence for over 18 months, [facility]

## 2013-08-14 NOTE — Assessment & Plan Note (Signed)
3 day h/o acute flare, prednisone for 5 days prescribed

## 2013-08-14 NOTE — Assessment & Plan Note (Signed)
Controlled, no change in medication DASH diet and commitment to daily physical activity for a minimum of 30 minutes discussed and encouraged, as a part of hypertension management. The importance of attaining a healthy weight is also discussed.  

## 2013-08-14 NOTE — Patient Instructions (Addendum)
F/u in January, call if you need me before  Flu vaccine today  You are referred to urology regarding urinary incontinence  Fasting lipid and  cmp and CBC in January before appt  Increase imipramine to 25 mg one twice daily till you see the urologist please  Urine is sent for further testing you will be called if you need treatment for an infection in the next approx 3 days  For sciatic nerve pain, prednisone for 5 days is sent to your pharmacy

## 2013-08-19 ENCOUNTER — Other Ambulatory Visit: Payer: Self-pay | Admitting: Family Medicine

## 2013-09-06 ENCOUNTER — Ambulatory Visit (HOSPITAL_COMMUNITY)
Admission: RE | Admit: 2013-09-06 | Discharge: 2013-09-06 | Disposition: A | Discharge status: Home / Self Care | Payer: BC Managed Care – PPO | Source: Ambulatory Visit | Attending: Family Medicine | Admitting: Family Medicine

## 2013-09-06 DIAGNOSIS — Z139 Encounter for screening, unspecified: Secondary | ICD-10-CM

## 2013-09-06 DIAGNOSIS — Z1231 Encounter for screening mammogram for malignant neoplasm of breast: Secondary | ICD-10-CM | POA: Insufficient documentation

## 2013-09-16 ENCOUNTER — Other Ambulatory Visit: Payer: Self-pay | Admitting: Family Medicine

## 2013-10-01 ENCOUNTER — Ambulatory Visit: Payer: BC Managed Care – PPO | Admitting: Family Medicine

## 2013-10-04 ENCOUNTER — Other Ambulatory Visit: Payer: Self-pay | Admitting: Family Medicine

## 2013-10-22 ENCOUNTER — Telehealth: Payer: Self-pay | Admitting: Family Medicine

## 2013-10-22 ENCOUNTER — Other Ambulatory Visit: Payer: Self-pay | Admitting: Family Medicine

## 2013-10-23 NOTE — Telephone Encounter (Signed)
Med refilled.

## 2013-11-12 ENCOUNTER — Other Ambulatory Visit: Payer: Self-pay | Admitting: Family Medicine

## 2013-11-12 LAB — CBC
HCT: 32.8 % — ABNORMAL LOW (ref 36.0–46.0)
Hemoglobin: 10.4 g/dL — ABNORMAL LOW (ref 12.0–15.0)
MCH: 26.9 pg (ref 26.0–34.0)
MCHC: 31.7 g/dL (ref 30.0–36.0)
MCV: 85 fL (ref 78.0–100.0)
Platelets: 310 10*3/uL (ref 150–400)
RBC: 3.86 MIL/uL — ABNORMAL LOW (ref 3.87–5.11)
RDW: 13.3 % (ref 11.5–15.5)
WBC: 4.8 10*3/uL (ref 4.0–10.5)

## 2013-11-13 ENCOUNTER — Encounter (INDEPENDENT_AMBULATORY_CARE_PROVIDER_SITE_OTHER): Payer: Self-pay

## 2013-11-13 ENCOUNTER — Telehealth: Payer: Self-pay | Admitting: Family Medicine

## 2013-11-13 ENCOUNTER — Encounter: Payer: Self-pay | Admitting: Family Medicine

## 2013-11-13 ENCOUNTER — Ambulatory Visit (INDEPENDENT_AMBULATORY_CARE_PROVIDER_SITE_OTHER): Payer: BC Managed Care – PPO | Admitting: Family Medicine

## 2013-11-13 VITALS — BP 122/82 | HR 89 | Resp 16 | Ht 61.0 in | Wt 192.4 lb

## 2013-11-13 DIAGNOSIS — K5900 Constipation, unspecified: Secondary | ICD-10-CM

## 2013-11-13 DIAGNOSIS — Z1211 Encounter for screening for malignant neoplasm of colon: Secondary | ICD-10-CM

## 2013-11-13 DIAGNOSIS — E785 Hyperlipidemia, unspecified: Secondary | ICD-10-CM

## 2013-11-13 DIAGNOSIS — R32 Unspecified urinary incontinence: Secondary | ICD-10-CM

## 2013-11-13 DIAGNOSIS — I10 Essential (primary) hypertension: Secondary | ICD-10-CM

## 2013-11-13 DIAGNOSIS — D649 Anemia, unspecified: Secondary | ICD-10-CM

## 2013-11-13 DIAGNOSIS — K219 Gastro-esophageal reflux disease without esophagitis: Secondary | ICD-10-CM

## 2013-11-13 DIAGNOSIS — E890 Postprocedural hypothyroidism: Secondary | ICD-10-CM

## 2013-11-13 DIAGNOSIS — E8881 Metabolic syndrome: Secondary | ICD-10-CM

## 2013-11-13 DIAGNOSIS — E669 Obesity, unspecified: Secondary | ICD-10-CM

## 2013-11-13 LAB — COMPREHENSIVE METABOLIC PANEL
ALT: 13 U/L (ref 0–35)
AST: 18 U/L (ref 0–37)
Albumin: 4 g/dL (ref 3.5–5.2)
Alkaline Phosphatase: 71 U/L (ref 39–117)
BUN: 12 mg/dL (ref 6–23)
CO2: 31 mEq/L (ref 19–32)
Calcium: 9.5 mg/dL (ref 8.4–10.5)
Chloride: 102 mEq/L (ref 96–112)
Creat: 1.01 mg/dL (ref 0.50–1.10)
Glucose, Bld: 93 mg/dL (ref 70–99)
Potassium: 4 mEq/L (ref 3.5–5.3)
Sodium: 139 mEq/L (ref 135–145)
Total Bilirubin: 0.4 mg/dL (ref 0.3–1.2)
Total Protein: 7.1 g/dL (ref 6.0–8.3)

## 2013-11-13 LAB — POC HEMOCCULT BLD/STL (OFFICE/1-CARD/DIAGNOSTIC): Fecal Occult Blood, POC: NEGATIVE

## 2013-11-13 LAB — FERRITIN: Ferritin: 147 ng/mL (ref 10–291)

## 2013-11-13 LAB — LIPID PANEL
Cholesterol: 181 mg/dL (ref 0–200)
HDL: 44 mg/dL (ref >39)
LDL Cholesterol: 96 mg/dL (ref 0–99)
Total CHOL/HDL Ratio: 4.1 Ratio
Triglycerides: 207 mg/dL — ABNORMAL HIGH (ref <150)
VLDL: 41 mg/dL — ABNORMAL HIGH (ref 0–40)

## 2013-11-13 LAB — IRON: IRON: 66 ug/dL (ref 42–145)

## 2013-11-13 NOTE — Patient Instructions (Signed)
F/u in 6 month, call if you need me before  You will be contacted re further labs on the anemia    Start 1 multivitamin, like centrum one daily please  Fasting lipid, cmp and CBC  in 6 month  Stool test today is negative for any hidden blood

## 2013-11-14 NOTE — Telephone Encounter (Signed)
Patient aware of lab results.

## 2013-11-17 NOTE — Assessment & Plan Note (Signed)
Currently well controlled continue current medication, managed by urology, she has also benefitted from physical therapy for this problem

## 2013-11-17 NOTE — Assessment & Plan Note (Signed)
Controlled, no change in medication  

## 2013-11-17 NOTE — Assessment & Plan Note (Signed)
Elevated TG needs to reduce intake of fried and fatty  Foods, no med change

## 2013-11-17 NOTE — Assessment & Plan Note (Signed)
Controlled with daily miralax , pt encouraged to increase fruit , vegetable and fiber intake also water

## 2013-11-17 NOTE — Assessment & Plan Note (Addendum)
Recurrent problem, FOB negative and colonooscopy is up to date , trial of daily MVI, no iron deficiency on further testing

## 2013-11-17 NOTE — Assessment & Plan Note (Signed)
Deteriorated. Patient re-educated about  the importance of commitment to a  minimum of 150 minutes of exercise per week. The importance of healthy food choices with portion control discussed. Encouraged to start a food diary, count calories and to consider  joining a support group. Sample diet sheets offered. Goals set by the patient for the next several months.    

## 2013-11-17 NOTE — Assessment & Plan Note (Signed)
Controlled, no change in medication DASH diet and commitment to daily physical activity for a minimum of 30 minutes discussed and encouraged, as a part of hypertension management. The importance of attaining a healthy weight is also discussed.  

## 2013-11-17 NOTE — Assessment & Plan Note (Signed)
Followed by endo and controlled on current med dose

## 2013-11-17 NOTE — Progress Notes (Signed)
The PT is here for follow up and re-evaluation of chronic medical conditions, medication management and review of any available recent lab and radiology data.  Preventive health is updated, specifically  Cancer screening and Immunization.   Questions or concerns regarding consultations or procedures which the PT has had in the interim are  Addressed.Great success with urinary incontinence unfer the care of urology The PT denies any adverse reactions to current medications since the last visit.  There are no new concerns.  There are no specific complaints   ROS: See HPI Denies recent fever or chills. Denies sinus pressure, nasal congestion, ear pain or sore throat. Denies chest congestion, productive cough or wheezing. Denies chest pains, palpitations and leg swelling Denies abdominal pain, nausea, vomiting,diarrhea or constipation.   Denies dysuria, frequency, hesitancy or incontinence. Denies joint pain, swelling and limitation in mobility. Denies headaches, seizures, numbness, or tingling. Denies depression, anxiety or insomnia. Denies skin break down or rash.    Patient alert and oriented and in no cardiopulmonary distress.  HEENT: No facial asymmetry, EOMI, no sinus tenderness,  oropharynx pink and moist.  Neck supple no adenopathy.  Chest: Clear to auscultation bilaterally.  CVS: S1, S2 no murmurs, no S3.  ABD: Soft non tender. Bowel sounds normal.No organomegaly or mass Rectal: no mass heme negative stool  Ext: No edema  MS: Adequate ROM spine, shoulders, hips and knees.  Skin: Intact, no ulcerations or rash noted.  Psych: Good eye contact, normal affect. Memory intact not anxious or depressed appearing.  CNS: CN 2-12 intact, power, tone and sensation normal throughout.

## 2013-11-17 NOTE — Assessment & Plan Note (Signed)
Pt aware of increased Cv risk as a result of the dx , hence the need to aggressively work at this

## 2013-12-20 ENCOUNTER — Telehealth: Payer: Self-pay

## 2013-12-20 ENCOUNTER — Other Ambulatory Visit: Payer: Self-pay | Admitting: Family Medicine

## 2013-12-20 NOTE — Telephone Encounter (Signed)
Appt is for 01/08/2014 at 8:30 AM.

## 2013-12-20 NOTE — Telephone Encounter (Signed)
Pt was being triaged for colonoscopy but thinks she might need EGD also. Making OV with Dr. Darrick PennaFields.

## 2013-12-26 ENCOUNTER — Other Ambulatory Visit: Payer: Self-pay | Admitting: Family Medicine

## 2014-01-07 ENCOUNTER — Other Ambulatory Visit: Payer: Self-pay | Admitting: Family Medicine

## 2014-01-08 ENCOUNTER — Ambulatory Visit (INDEPENDENT_AMBULATORY_CARE_PROVIDER_SITE_OTHER): Payer: BC Managed Care – PPO | Admitting: Gastroenterology

## 2014-01-08 ENCOUNTER — Encounter: Payer: Self-pay | Admitting: Gastroenterology

## 2014-01-08 ENCOUNTER — Encounter (INDEPENDENT_AMBULATORY_CARE_PROVIDER_SITE_OTHER): Payer: Self-pay

## 2014-01-08 VITALS — BP 121/81 | HR 86 | Temp 97.6°F | Wt 195.2 lb

## 2014-01-08 DIAGNOSIS — Z1211 Encounter for screening for malignant neoplasm of colon: Secondary | ICD-10-CM | POA: Insufficient documentation

## 2014-01-08 DIAGNOSIS — K5900 Constipation, unspecified: Secondary | ICD-10-CM

## 2014-01-08 MED ORDER — LINACLOTIDE 145 MCG PO CAPS: ORAL_CAPSULE | ORAL | Status: DC

## 2014-01-08 NOTE — Progress Notes (Signed)
Subjective:    Patient ID: Laura [name], female    DOB: [birth year], 63 y.o.   MRN: [id number] [name], MD  [facility] ONE DOSE A DAY. FEELS LIKE SWELLING UPA ND GETTING BLOATED. DOESN'T WANT TO STRAIN. TAKING IRON PILLS. URGE TO HAVE TO HAVE A BM BUT DOESN'T. HAS A LOT OF GAS. DRINKING WATER AND EATING FIBER. HASN'T TRIED LINZESS OR AMITIZA. OCCASIONAL ABD PAIN. HAD ONE EPISODE WHERE STOMACH FELT AND STOLL FELT HOT COMING. NOT EATING SPICY. HAVING BM Q3 DAYS. PT DENIES FEVER, CHILLS, BRBPR, nausea, vomiting, melena, diarrhea,  Problems SWALLOWING, OR heartburn or indigestion.    Past Medical History  Diagnosis Date  . GERD (gastroesophageal reflux disease)   . Gastritis   . Hypertension   . Hyperlipidemia   . Anemia   . Cystitis   . Osteopenia   . Shoulder pain   . Hypothyroidism   . Hashimoto's thyroiditis     Hx   . Urinary incontinence   . Anxiety   . Depression     Past Surgical History  Procedure Laterality Date  . Total abdominal hysterectomy  1994  . Umbilical hernia repair    . Breast excisional biopsy  2010    Excisional biopsy of benign left breast mass -lopoma   . Resection of left lobe of thyroid    . Bilateral tubal ligation    . Right carpal tunnel release    . Urethral dilation for stenosis  2009  . Rt. knee athroscopy  2004  . Colonscopy  2005    Dr. Katrinka BlazingSmith  . Esophagogastroduodenoscopy  12/2009    chronic gastritis  . Breast surgery      left nreast biopsy  . Colonoscopy  08/26/2011    Procedure: COLONOSCOPY;  Surgeon: Arlyce HarmanSandi M Atzel Mccambridge, MD;  Location: [facility];  Service: Endoscopy;  Laterality: N/A;  8:30   Allergies  Allergen Reactions  . Dilaudid [Hydromorphone Hcl]   . Nsaids     REACTION: per GI pt no longer to use NSAIDS   Current Outpatient Prescriptions  Medication Sig Dispense Refill  . hydrochlorothiazide (HYDRODIURIL) 25 MG tablet TAKE 1 TABLET BY MOUTH DAILY    . imipramine (TOFRANIL) 25 MG tablet Take 1  tablet (25 mg total) by mouth at bedtime.    Marland Kitchen. levothyroxine (SYNTHROID, LEVOTHROID) 112 MCG tablet TAKE 1 TABLET BY MOUTH EVERY MORNING    . losartan (COZAAR) 25 MG tablet TAKE 1 TABLET BY MOUTH ONCE DAILY    . lovastatin (MEVACOR) 40 MG tablet TAKE 2 TABLETS BY MOUTH EVERY NIGHT AT BEDTIME FOR CHOLESTEROL    . pantoprazole (PROTONIX) 40 MG tablet TAKE 1 TABLET BY MOUTH TWICE DAILY    . polyethylene glycol powder (GLYCOLAX/MIRALAX) powder MIX 1 CAPFUL (17 GRAMS) WITH 8OZ OF WATER OR JUICE TWICE DAILY.    . mirabegron ER (MYRBETRIQ) 50 MG TB24 tablet Take 1 tablet (50 mg total) by mouth daily. NOT TAKING DUE TO COST         Review of Systems     Objective:   Physical Exam  Vitals reviewed. Constitutional: She is oriented to person, place, and time. She appears well-nourished. No distress.  HENT:  Head: Normocephalic and atraumatic.  Mouth/Throat: Oropharynx is clear and moist. No oropharyngeal exudate.  Eyes: Pupils are equal, round, and reactive to light. No scleral icterus.  Neck: Normal range of motion. Neck supple.  Cardiovascular: Normal rate, regular rhythm and normal heart sounds.   Pulmonary/Chest:  Effort normal and breath sounds normal.  Abdominal: Soft. Bowel sounds are normal. She exhibits no distension. There is no tenderness.  Musculoskeletal: She exhibits no edema.  Lymphadenopathy:    She has no cervical adenopathy.  Neurological: She is alert and oriented to person, place, and time.  NO FOCAL DEFICITS   Psychiatric: She has a normal mood and affect.          Assessment & Plan:

## 2014-01-08 NOTE — Assessment & Plan Note (Signed)
Sx not ideally controlled with water, diet and Miralax.  ADD LINZESS 30 MINS PRIOR TO BREAKFAST.  Call in 2 weeks if sx not improved. Drink water Eat fiber Exercise OPV IN 3 MOS

## 2014-01-08 NOTE — Patient Instructions (Signed)
ADD ONE LINZESS 30 MINS PRIOR TO BREAKFAST. You have 145 mcg tablets. IF AFTER ONE WEEK YOU ARE NOT HAVING A BM EVERY OTHER DAY THEN TAKE 2  LINZESS.  Call in 3 WEEKS IF YOU SYMPTOMS ARE NOT improved.  DRINK WATER TO KEEP YOUR URINE LIGHT YELLOW.  FOLLOW A HIGH FIBER DIET. AVOID ITEMS THAT CAUSE BLOATING & GAS. SEE INFO BELOW.  CONTINUE YOUR WEIGHT LOSS EFFORTS AND EXERCISE.  FOLLOW UP IN 3 MOS.    High-Fiber Diet A high-fiber diet changes your normal diet to include more whole grains, legumes, fruits, and vegetables. Changes in the diet involve replacing refined carbohydrates with unrefined foods. The calorie level of the diet is essentially unchanged. The Dietary Reference Intake (recommended amount) for adult males is 38 grams per day. For adult females, it is 25 grams per day. Pregnant and lactating women should consume 28 grams of fiber per day. Fiber is the intact part of a plant that is not broken down during digestion. Functional fiber is fiber that has been isolated from the plant to provide a beneficial effect in the body. PURPOSE  Increase stool bulk.   Ease and regulate bowel movements.   Lower cholesterol.  INDICATIONS THAT YOU NEED MORE FIBER  Constipation and hemorrhoids.   Uncomplicated diverticulosis (intestine condition) and irritable bowel syndrome.   Weight management.   As a protective measure against hardening of the arteries (atherosclerosis), diabetes, and cancer.   GUIDELINES FOR INCREASING FIBER IN THE DIET  Start adding fiber to the diet slowly. A gradual increase of about 5 more grams (2 slices of whole-wheat bread, 2 servings of most fruits or vegetables, or 1 bowl of high-fiber cereal) per day is best. Too rapid an increase in fiber may result in constipation, flatulence, and bloating.   Drink enough water and fluids to keep your urine clear or pale yellow. Water, juice, or caffeine-free drinks are recommended. Not drinking enough fluid may cause  constipation.   Eat a variety of high-fiber foods rather than one type of fiber.   Try to increase your intake of fiber through using high-fiber foods rather than fiber pills or supplements that contain small amounts of fiber.   The goal is to change the types of food eaten. Do not supplement your present diet with high-fiber foods, but replace foods in your present diet.  INCLUDE A VARIETY OF FIBER SOURCES  Replace refined and processed grains with whole grains, canned fruits with fresh fruits, and incorporate other fiber sources. White rice, white breads, and most bakery goods contain little or no fiber.   Brown whole-grain rice, buckwheat oats, and many fruits and vegetables are all good sources of fiber. These include: broccoli, Brussels sprouts, cabbage, cauliflower, beets, sweet potatoes, white potatoes (skin on), carrots, tomatoes, eggplant, squash, berries, fresh fruits, and dried fruits.   Cereals appear to be the richest source of fiber. Cereal fiber is found in whole grains and bran. Bran is the fiber-rich outer coat of cereal grain, which is largely removed in refining. In whole-grain cereals, the bran remains. In breakfast cereals, the largest amount of fiber is found in those with "bran" in their names. The fiber content is sometimes indicated on the label.   You may need to include additional fruits and vegetables each day.   In baking, for 1 cup white flour, you may use the following substitutions:   1 cup whole-wheat flour minus 2 tablespoons.   1/2 cup white flour plus 1/2 cup whole-wheat flour.

## 2014-01-08 NOTE — Assessment & Plan Note (Signed)
NEXT TCS 2022

## 2014-01-08 NOTE — Progress Notes (Signed)
cc'd to pcp 

## 2014-01-13 NOTE — Progress Notes (Signed)
Reminder in epic °

## 2014-01-24 ENCOUNTER — Other Ambulatory Visit: Payer: Self-pay | Admitting: Family Medicine

## 2014-03-14 ENCOUNTER — Other Ambulatory Visit: Payer: Self-pay | Admitting: Family Medicine

## 2014-04-11 ENCOUNTER — Telehealth: Payer: Self-pay

## 2014-04-14 ENCOUNTER — Ambulatory Visit (HOSPITAL_COMMUNITY)
Admission: RE | Admit: 2014-04-14 | Discharge: 2014-04-14 | Disposition: A | Discharge status: Home / Self Care | Payer: BC Managed Care – PPO | Source: Ambulatory Visit | Attending: Family Medicine | Admitting: Family Medicine

## 2014-04-14 ENCOUNTER — Encounter: Payer: Self-pay | Admitting: Family Medicine

## 2014-04-14 ENCOUNTER — Other Ambulatory Visit: Payer: Self-pay | Admitting: Family Medicine

## 2014-04-14 ENCOUNTER — Ambulatory Visit (INDEPENDENT_AMBULATORY_CARE_PROVIDER_SITE_OTHER): Payer: BC Managed Care – PPO | Admitting: Family Medicine

## 2014-04-14 VITALS — BP 120/82 | HR 81 | Resp 16 | Wt 190.4 lb

## 2014-04-14 DIAGNOSIS — M545 Low back pain, unspecified: Secondary | ICD-10-CM

## 2014-04-14 DIAGNOSIS — M79604 Pain in right leg: Secondary | ICD-10-CM | POA: Insufficient documentation

## 2014-04-14 DIAGNOSIS — M5410 Radiculopathy, site unspecified: Secondary | ICD-10-CM | POA: Insufficient documentation

## 2014-04-14 DIAGNOSIS — M79609 Pain in unspecified limb: Secondary | ICD-10-CM

## 2014-04-14 DIAGNOSIS — E785 Hyperlipidemia, unspecified: Secondary | ICD-10-CM

## 2014-04-14 DIAGNOSIS — M7989 Other specified soft tissue disorders: Secondary | ICD-10-CM | POA: Insufficient documentation

## 2014-04-14 DIAGNOSIS — E8881 Metabolic syndrome: Secondary | ICD-10-CM

## 2014-04-14 DIAGNOSIS — I10 Essential (primary) hypertension: Secondary | ICD-10-CM

## 2014-04-14 DIAGNOSIS — M79605 Pain in left leg: Secondary | ICD-10-CM

## 2014-04-14 DIAGNOSIS — N390 Urinary tract infection, site not specified: Secondary | ICD-10-CM

## 2014-04-14 LAB — POCT URINALYSIS DIPSTICK
Bilirubin, UA: NEGATIVE
Blood, UA: NEGATIVE
GLUCOSE UA: NEGATIVE
KETONES UA: NEGATIVE
Nitrite, UA: NEGATIVE
Protein, UA: NEGATIVE
SPEC GRAV UA: 1.02
Urobilinogen, UA: 0.2
pH, UA: 7

## 2014-04-14 IMAGING — CR DG LUMBAR SPINE COMPLETE 4+V
5 series · 5 of 5 positions shown · non-contrast
Comparison: CT abdomen pelvis [DATE]

CLINICAL DATA: Low back pain

EXAM:
LUMBAR SPINE - COMPLETE 4+ VIEW

[view not recorded (1 of 5)]
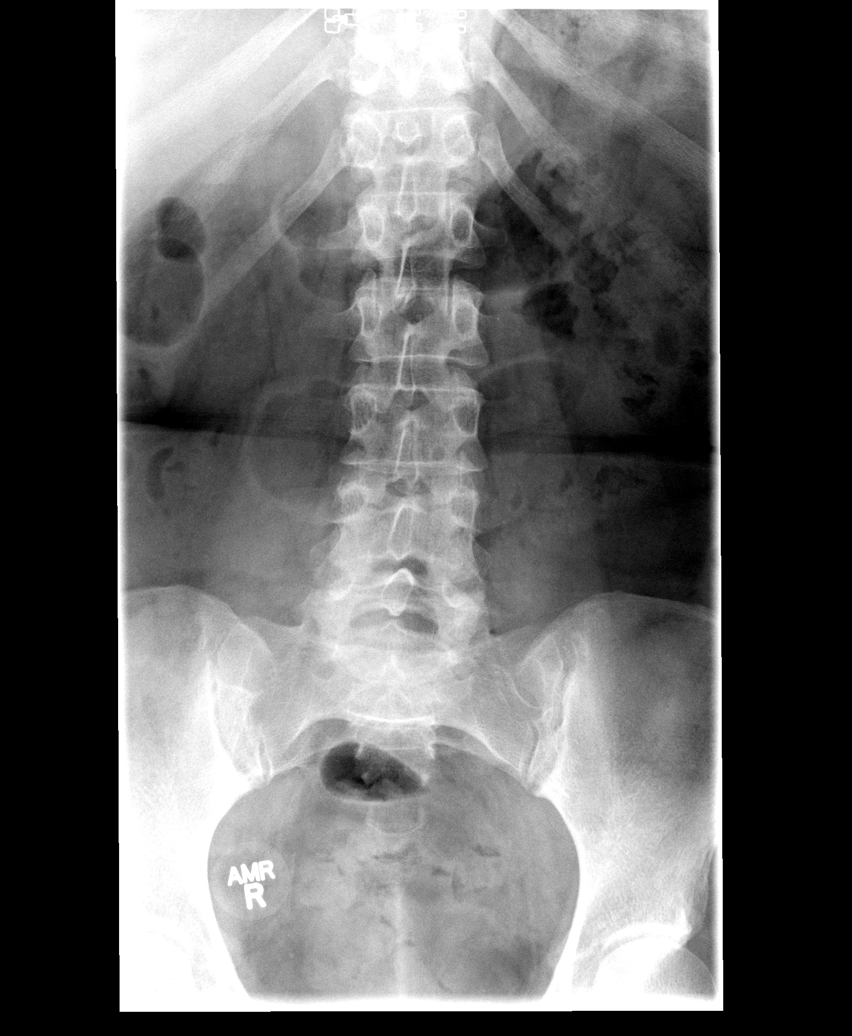

[view not recorded (2 of 5)]
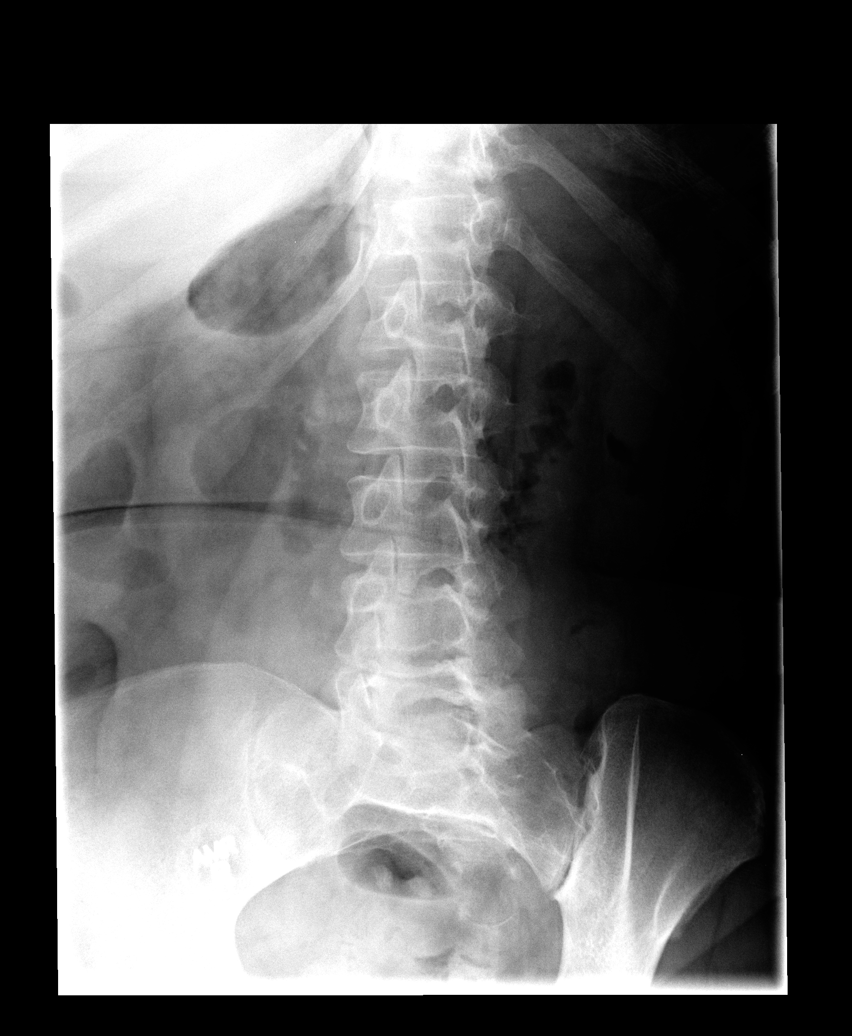

[view not recorded (3 of 5)]
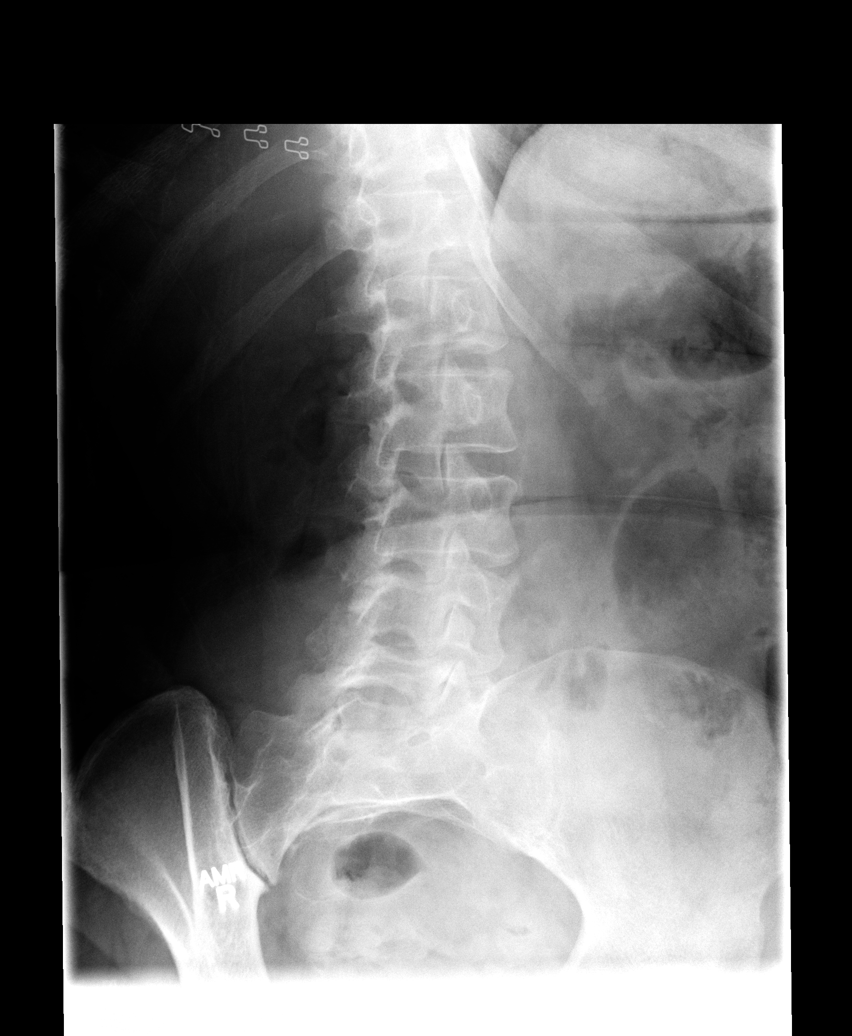

[view not recorded (4 of 5)]
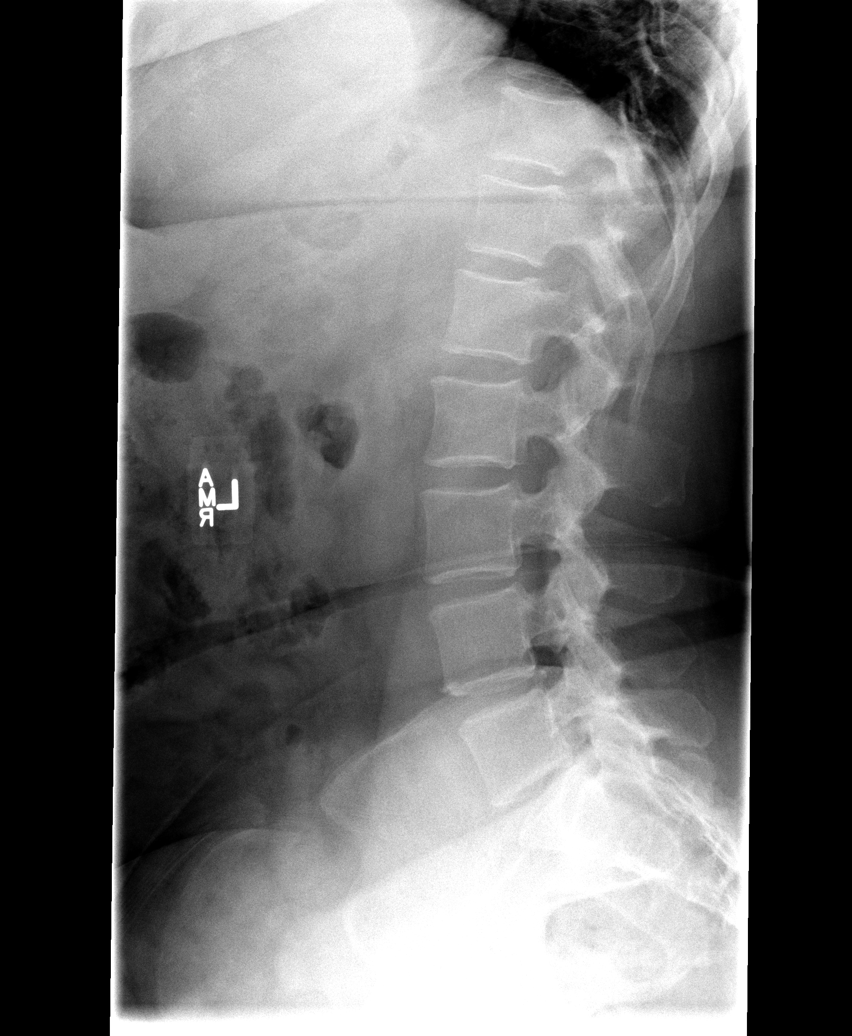

[view not recorded (5 of 5)]
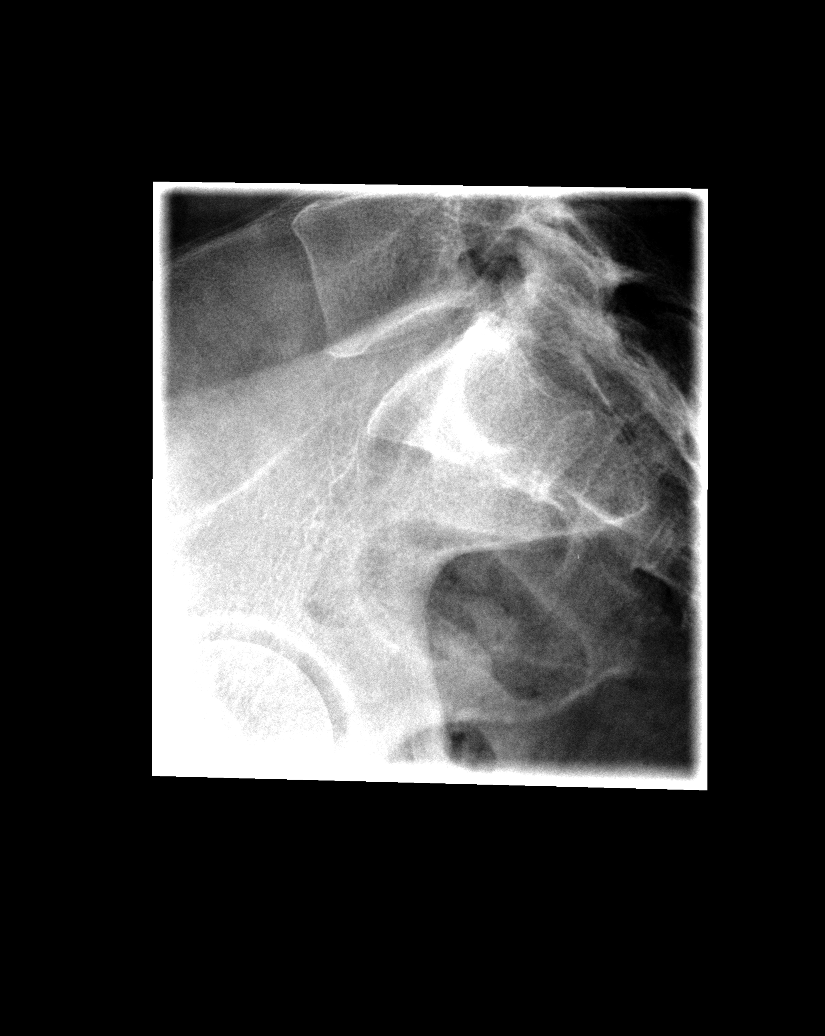

[5 of 5 positions shown; findings below may reference images not displayed]

FINDINGS: Normal alignment no fracture. Mild disc degeneration and annular
calcification at L4-5 which has progressed since the prior study.
Remaining disc spaces are intact. No pars defect. No mass lesion.
IMPRESSION: Mild disc degeneration L4-5.

## 2014-04-14 IMAGING — US US EXTREM LOW VENOUS*R*
1 series · 13 of 24 positions shown · non-contrast
Comparison: None.

CLINICAL DATA: Right leg pain and swelling



[Series 1: us extrem low venous*right* · 0.06mm/px · 13 of 34 slices shown]
[im 1/34]
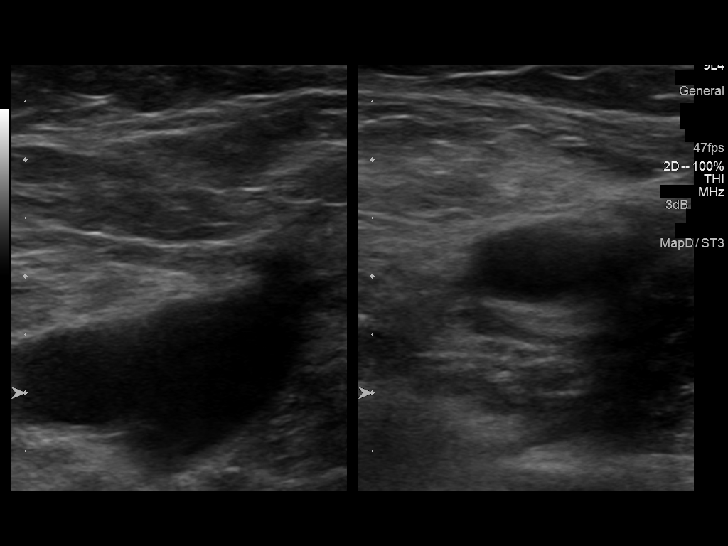
[im 3/34]
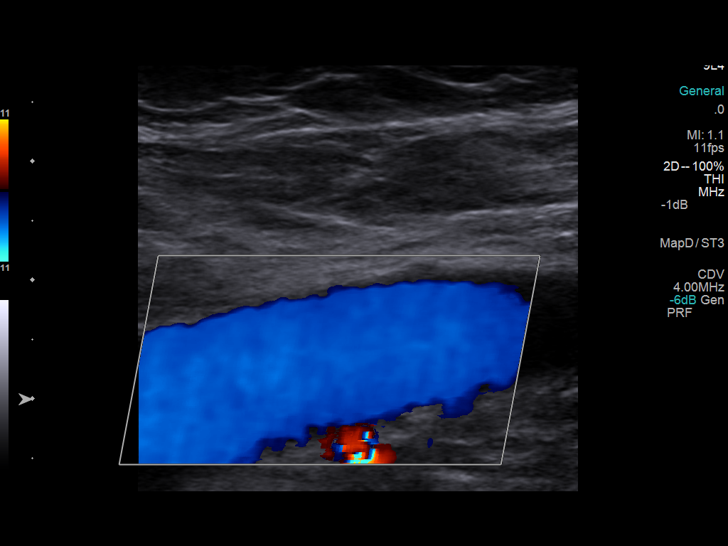
[im 6/34]
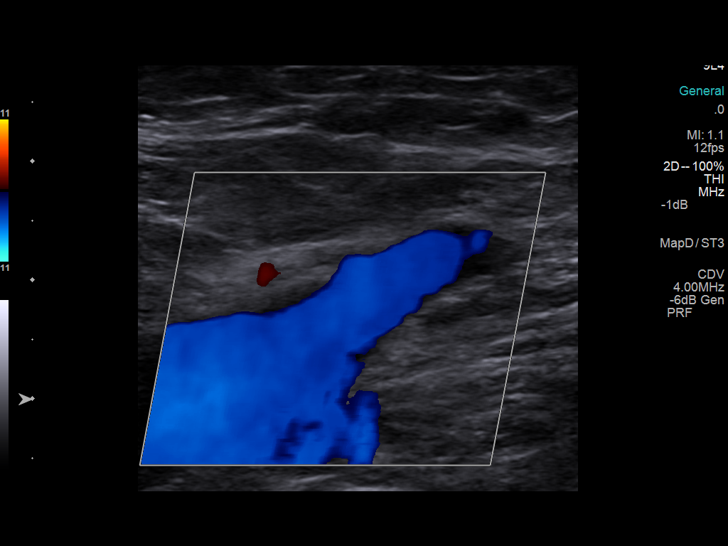
[im 9/34]
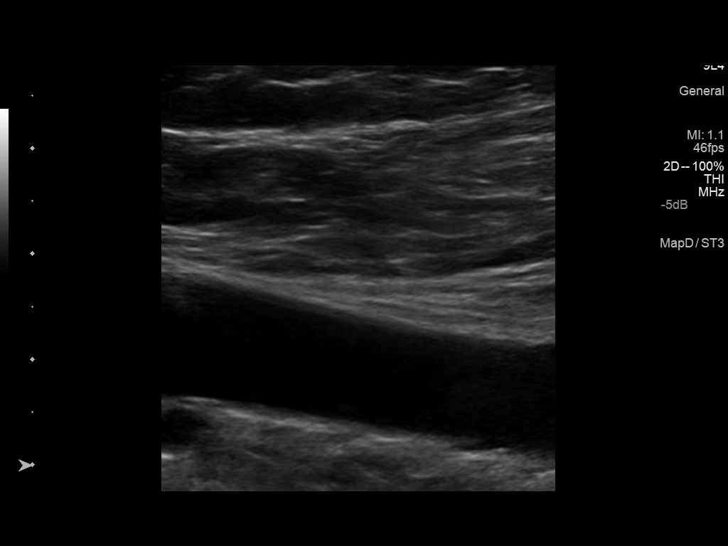
[im 12/34]
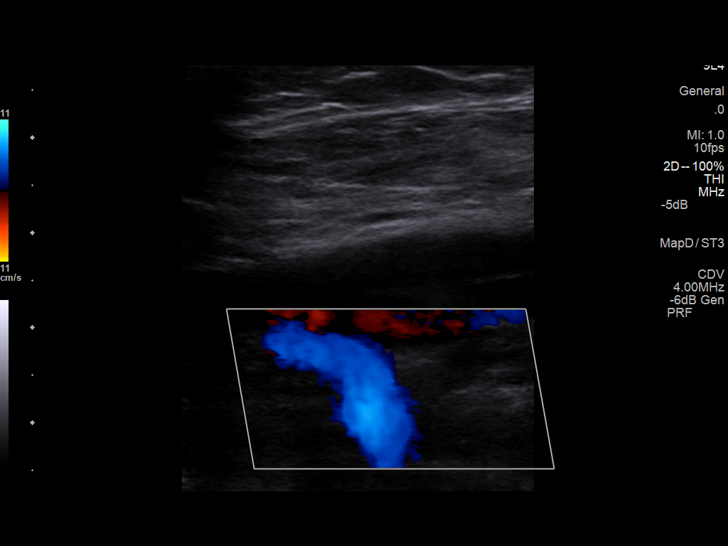
[im 15/34]
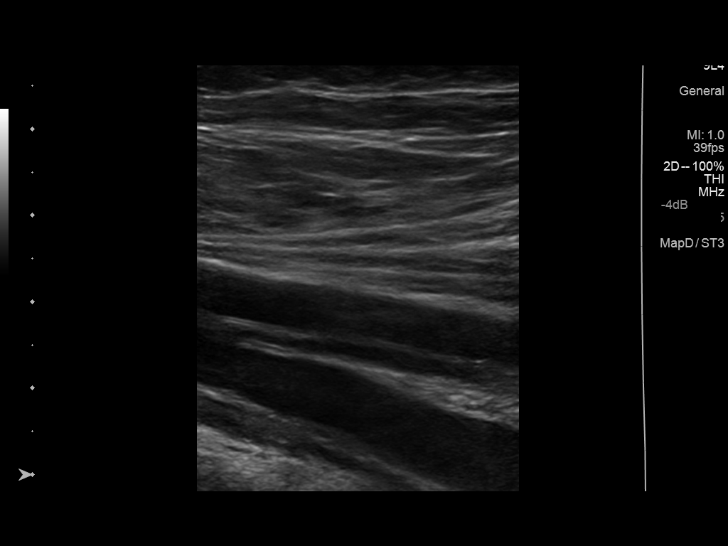
[im 18/34]
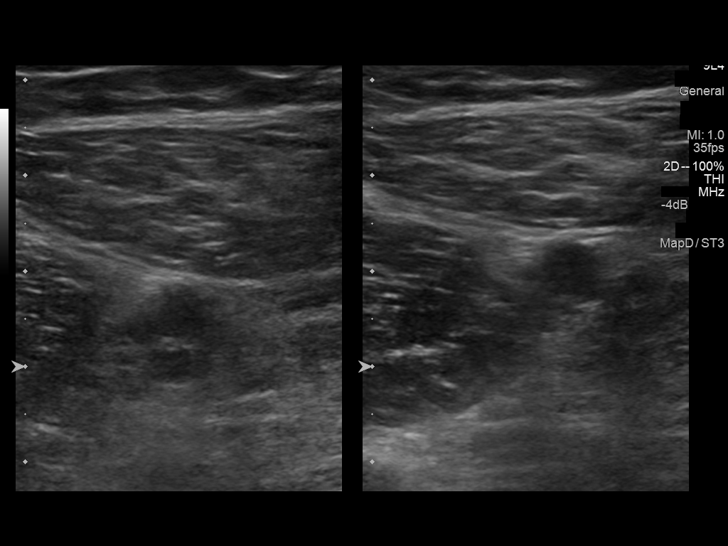
[im 19/34]
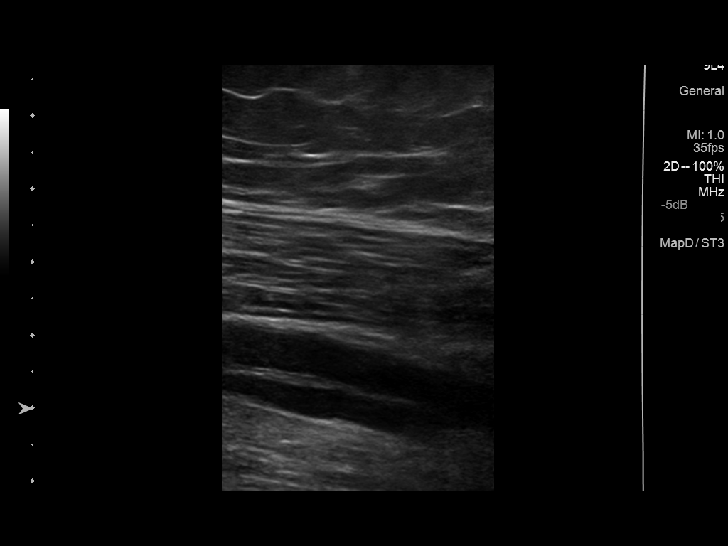
[im 22/34]
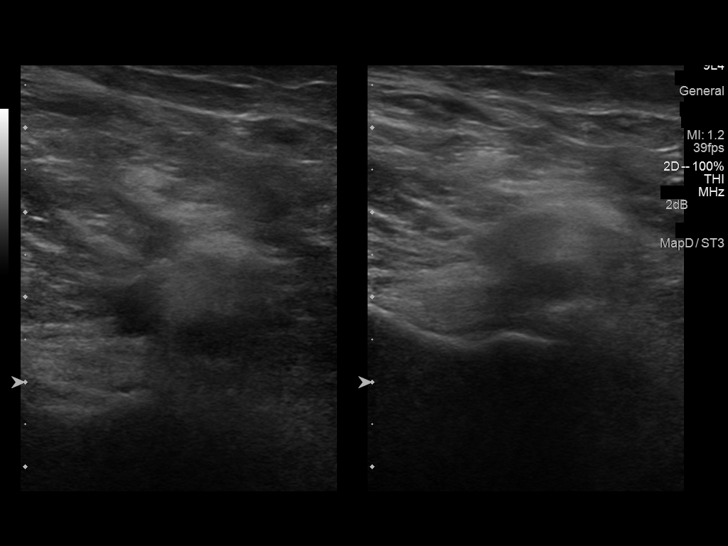
[im 25/34]
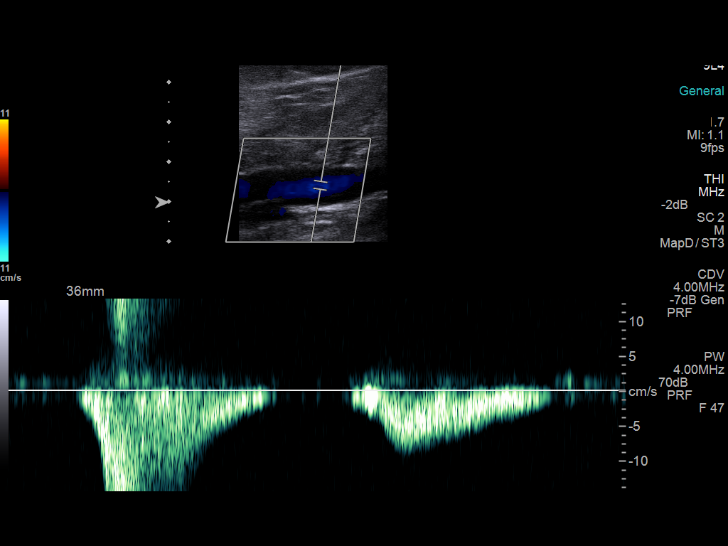
[im 28/34]
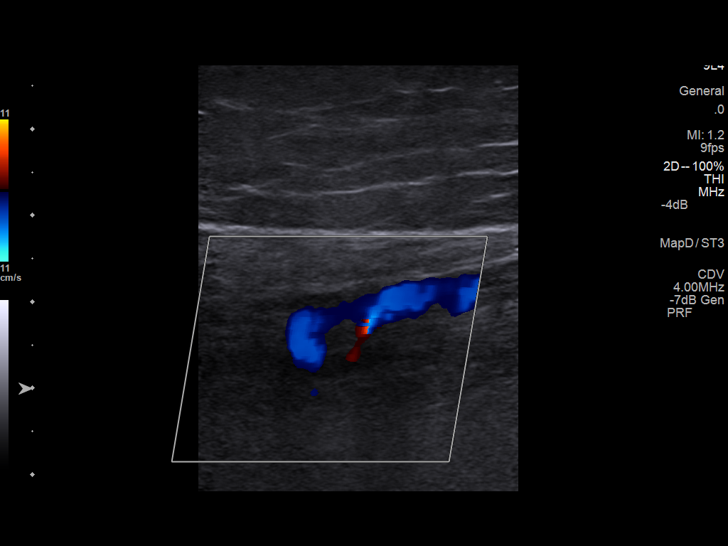
[im 31/34]
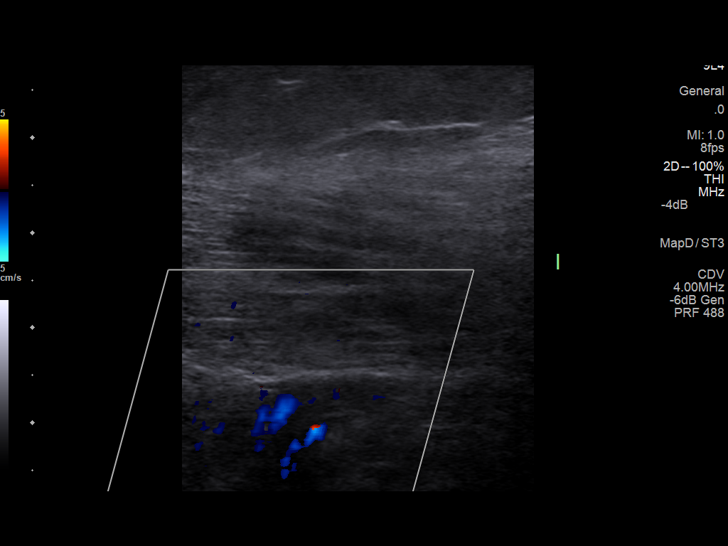
[im 34/34]
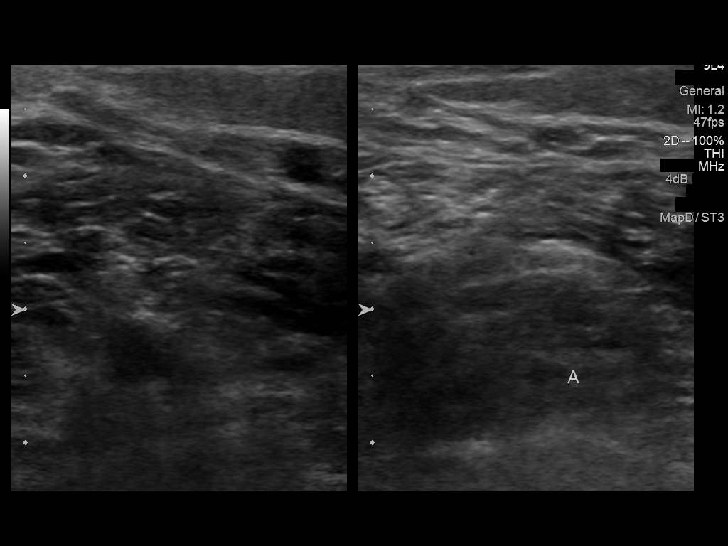

[13 of 24 positions shown; findings below may reference images not displayed]

FINDINGS: Common Femoral Vein: No evidence of thrombus. Normal
compressibility, respiratory phasicity and response to augmentation.

Saphenofemoral Junction: No evidence of thrombus. Normal
compressibility and flow on color Doppler imaging.

Profunda Femoral Vein: No evidence of thrombus. Normal
compressibility and flow on color Doppler imaging.

Femoral Vein: No evidence of thrombus. Normal compressibility,
respiratory phasicity and response to augmentation.

Popliteal Vein: No evidence of thrombus. Normal compressibility,
respiratory phasicity and response to augmentation.

Calf Veins: No evidence of thrombus. Normal compressibility and flow
on color Doppler imaging.

Superficial Great Saphenous Vein: No evidence of thrombus. Normal
compressibility and flow on color Doppler imaging.

Venous Reflux:  None.

Other Findings:  None.
IMPRESSION: No evidence of deep venous thrombosis.

## 2014-04-14 MED ORDER — KETOROLAC TROMETHAMINE 60 MG/2ML IM SOLN
60.0000 mg | Freq: Once | INTRAMUSCULAR | Status: AC
  Administered 2014-04-14: 60 mg via INTRAMUSCULAR

## 2014-04-14 MED ORDER — CIPROFLOXACIN HCL 500 MG PO TABS: 500.0000 mg | ORAL_TABLET | Freq: Two times a day (BID) | ORAL | Status: DC

## 2014-04-14 MED ORDER — METHYLPREDNISOLONE ACETATE 80 MG/ML IJ SUSP
80.0000 mg | Freq: Once | INTRAMUSCULAR | Status: AC
  Administered 2014-04-14: 80 mg via INTRAMUSCULAR

## 2014-04-14 MED ORDER — GABAPENTIN 100 MG PO CAPS: 100.0000 mg | ORAL_CAPSULE | Freq: Every day | ORAL | Status: DC

## 2014-04-14 NOTE — Telephone Encounter (Signed)
Patient seen this am for ov.

## 2014-04-14 NOTE — Patient Instructions (Addendum)
Pls change July visit, she needs complete physical exam scheduled instead  You are being treated for back pain with possible nerve irritation  Toradol and depo medrol in the office and start gabapentin at bedtime, you also need an xray of your low back today   For right leg pain and swelling you are referred fro [place] of the leg to r/o clot,   Fasting lipid, cmp, cBc hBA1C in July before visit  You appear to have a UTI and medication is also sent in for this, we will call if needed, when the full result on the urine is available

## 2014-04-14 NOTE — Progress Notes (Signed)
   Subjective:    Patient ID: Laura [name], female    DOB: 1951-04-05, 63 y.o.   MRN: [id number]  HPI .  3 week h/o acute LBP radiating to all 5 toes, in each feet current is an 8 does not keep pt awake, never happened before, no recollection of inciting trauma Denies current weakness , or numbness, no incontinence of stool  Frequency and dysuria for past 1  day C/o excessive pain and swelling in right thigh from calf to upper thigh, denies cough , hemoptysis, os increased dyspnea Prior to this she has been well      Review of Systems See HPI Denies recent fever or chills. Denies sinus pressure, nasal congestion, ear pain or sore throat. Denies chest congestion, productive cough or wheezing. Denies chest pains, palpitations and leg swelling Denies abdominal pain, nausea, vomiting,diarrhea or constipation.    Denies headaches, seizures, numbness, or tingling. Denies depression, anxiety or insomnia. Denies skin break down or rash.        Objective:   Physical Exam BP 120/82  Pulse 81  Resp 16  Wt 190 lb 6.4 oz (86.365 kg)  SpO2 100% Patient alert and oriented and in no cardiopulmonary distress.Pt in excessive pain with pressure to right thigh   HEENT: No facial asymmetry, EOMI,   oropharynx pink and moist.  Neck supple no JVD, no mass.  Chest: Clear to auscultation bilaterally.  CVS: S1, S2 no murmurs, no S3.  ABD: Soft non tender.   Ext: No edema  MS: decreased  ROM lumbar  spine, decreased ROM right hip,   Skin: Intact, no ulcerations or rash noted.  Psych: Good eye contact, normal affect. Memory intact not anxious or depressed appearing.  CNS: CN 2-12 intact, power,  normal throughout.no focal deficits noted.        Assessment & Plan:  LBP radiating to both legs acite back pain with radiaition , no known trigger, first episode, worse symptoms on right side, not  associated with numbness, no incontinence of stool or urine Xray, ant inflammatories and  trial of bedtime gabapentin titrating up to 300mg , will need MRI if symptoms persist or worsen  Leg pain, right Right leg pain and swelling worse in thisgh for 5 days, obtain venous doppler to r/o DVT today  HYPERLIPIDEMIA Uncontrolled, elevated TG Updated lab needed at/ before next visit. Hyperlipidemia:Low fat diet discussed and encouraged.    HYPERTENSION Controlled, no change in medication DASH diet and commitment to daily physical activity for a minimum of 30 minutes discussed and encouraged, as a part of hypertension management. The importance of attaining a healthy weight is also discussed.   UTI (urinary tract infection) Symptomatic with abnormal UA, cipro x 3 days and f/u c/s

## 2014-04-15 DIAGNOSIS — N390 Urinary tract infection, site not specified: Secondary | ICD-10-CM | POA: Insufficient documentation

## 2014-04-15 NOTE — Assessment & Plan Note (Signed)
Controlled, no change in medication DASH diet and commitment to daily physical activity for a minimum of 30 minutes discussed and encouraged, as a part of hypertension management. The importance of attaining a healthy weight is also discussed.  

## 2014-04-15 NOTE — Assessment & Plan Note (Signed)
Symptomatic with abnormal UA, cipro x 3 days and f/u c/s

## 2014-04-15 NOTE — Assessment & Plan Note (Signed)
acite back pain with radiaition , no known trigger, first episode, worse symptoms on right side, not  associated with numbness, no incontinence of stool or urine Xray, ant inflammatories and trial of bedtime gabapentin titrating up to 300mg , will need MRI if symptoms persist or worsen

## 2014-04-15 NOTE — Assessment & Plan Note (Signed)
Uncontrolled, elevated TG Updated lab needed at/ before next visit. Hyperlipidemia:Low fat diet discussed and encouraged.

## 2014-04-15 NOTE — Assessment & Plan Note (Signed)
Right leg pain and swelling worse in thisgh for 5 days, obtain venous doppler to r/o DVT today

## 2014-04-16 LAB — URINE CULTURE

## 2014-04-23 ENCOUNTER — Encounter: Payer: Self-pay | Admitting: Gastroenterology

## 2014-05-07 LAB — LIPID PANEL
Cholesterol: 176 mg/dL (ref 0–200)
HDL: 58 mg/dL (ref >39)
LDL Cholesterol: 95 mg/dL (ref 0–99)
Total CHOL/HDL Ratio: 3 Ratio
Triglycerides: 116 mg/dL (ref <150)
VLDL: 23 mg/dL (ref 0–40)

## 2014-05-07 LAB — COMPREHENSIVE METABOLIC PANEL
ALBUMIN: 4 g/dL (ref 3.5–5.2)
ALT: 19 U/L (ref 0–35)
AST: 20 U/L (ref 0–37)
Alkaline Phosphatase: 95 U/L (ref 39–117)
BILIRUBIN TOTAL: 0.5 mg/dL (ref 0.2–1.2)
BUN: 11 mg/dL (ref 6–23)
CALCIUM: 9.3 mg/dL (ref 8.4–10.5)
CHLORIDE: 100 meq/L (ref 96–112)
CO2: 29 meq/L (ref 19–32)
Creat: 0.97 mg/dL (ref 0.50–1.10)
GLUCOSE: 84 mg/dL (ref 70–99)
Potassium: 3.6 mEq/L (ref 3.5–5.3)
Sodium: 138 mEq/L (ref 135–145)
Total Protein: 7.4 g/dL (ref 6.0–8.3)

## 2014-05-07 LAB — CBC WITH DIFFERENTIAL/PLATELET
BASOS ABS: 0.1 10*3/uL (ref 0.0–0.1)
Basophils Relative: 1 % (ref 0–1)
EOS PCT: 4 % (ref 0–5)
Eosinophils Absolute: 0.2 10*3/uL (ref 0.0–0.7)
HCT: 33.3 % — ABNORMAL LOW (ref 36.0–46.0)
Hemoglobin: 10.9 g/dL — ABNORMAL LOW (ref 12.0–15.0)
LYMPHS PCT: 35 % (ref 12–46)
Lymphs Abs: 1.8 10*3/uL (ref 0.7–4.0)
MCH: 26.9 pg (ref 26.0–34.0)
MCHC: 32.7 g/dL (ref 30.0–36.0)
MCV: 82.2 fL (ref 78.0–100.0)
Monocytes Absolute: 0.3 10*3/uL (ref 0.1–1.0)
Monocytes Relative: 6 % (ref 3–12)
NEUTROS PCT: 54 % (ref 43–77)
Neutro Abs: 2.8 10*3/uL (ref 1.7–7.7)
PLATELETS: 329 10*3/uL (ref 150–400)
RBC: 4.05 MIL/uL (ref 3.87–5.11)
RDW: 13.3 % (ref 11.5–15.5)
WBC: 5.2 10*3/uL (ref 4.0–10.5)

## 2014-05-07 LAB — HEMOGLOBIN A1C
HEMOGLOBIN A1C: 5.9 % — AB (ref <5.7)
Mean Plasma Glucose: 123 mg/dL — ABNORMAL HIGH (ref <117)

## 2014-05-13 ENCOUNTER — Ambulatory Visit: Payer: BC Managed Care – PPO | Admitting: Family Medicine

## 2014-05-19 ENCOUNTER — Telehealth: Payer: Self-pay

## 2014-05-19 MED ORDER — HYDROXYZINE HCL 10 MG PO TABS: 10.0000 mg | ORAL_TABLET | Freq: Every evening | ORAL | Status: DC | PRN

## 2014-05-19 NOTE — Telephone Encounter (Signed)
Verbal order given for Hydroxyzine 10mg  qhs prn for itching.  Patient aware and given rx in hand.

## 2014-06-05 ENCOUNTER — Encounter (INDEPENDENT_AMBULATORY_CARE_PROVIDER_SITE_OTHER): Payer: Self-pay

## 2014-06-05 ENCOUNTER — Encounter: Payer: Self-pay | Admitting: Family Medicine

## 2014-06-05 ENCOUNTER — Other Ambulatory Visit (HOSPITAL_COMMUNITY)
Admission: RE | Admit: 2014-06-05 | Discharge: 2014-06-05 | Disposition: A | Discharge status: Home / Self Care | Payer: BC Managed Care – PPO | Source: Ambulatory Visit | Attending: Family Medicine | Admitting: Family Medicine

## 2014-06-05 ENCOUNTER — Ambulatory Visit (INDEPENDENT_AMBULATORY_CARE_PROVIDER_SITE_OTHER): Payer: BC Managed Care – PPO | Admitting: Family Medicine

## 2014-06-05 VITALS — BP 106/70 | HR 78 | Resp 18 | Ht 61.0 in | Wt 190.0 lb

## 2014-06-05 DIAGNOSIS — I10 Essential (primary) hypertension: Secondary | ICD-10-CM

## 2014-06-05 DIAGNOSIS — R7302 Impaired glucose tolerance (oral): Secondary | ICD-10-CM

## 2014-06-05 DIAGNOSIS — Z124 Encounter for screening for malignant neoplasm of cervix: Secondary | ICD-10-CM

## 2014-06-05 DIAGNOSIS — Z23 Encounter for immunization: Secondary | ICD-10-CM | POA: Insufficient documentation

## 2014-06-05 DIAGNOSIS — Z01419 Encounter for gynecological examination (general) (routine) without abnormal findings: Secondary | ICD-10-CM | POA: Insufficient documentation

## 2014-06-05 DIAGNOSIS — Z0000 Encounter for general adult medical examination without abnormal findings: Secondary | ICD-10-CM | POA: Insufficient documentation

## 2014-06-05 DIAGNOSIS — Z1211 Encounter for screening for malignant neoplasm of colon: Secondary | ICD-10-CM

## 2014-06-05 LAB — POC HEMOCCULT BLD/STL (OFFICE/1-CARD/DIAGNOSTIC): FECAL OCCULT BLD: NEGATIVE

## 2014-06-05 MED ORDER — PANTOPRAZOLE SODIUM 40 MG PO TBEC: DELAYED_RELEASE_TABLET | ORAL | Status: DC

## 2014-06-05 MED ORDER — HYDROCHLOROTHIAZIDE 25 MG PO TABS: ORAL_TABLET | ORAL | Status: DC

## 2014-06-05 MED ORDER — LEVOTHYROXINE SODIUM 112 MCG PO TABS: ORAL_TABLET | ORAL | Status: DC

## 2014-06-05 MED ORDER — IMIPRAMINE HCL 25 MG PO TABS: ORAL_TABLET | ORAL | Status: DC

## 2014-06-05 NOTE — Patient Instructions (Addendum)
F/u in 4 month, call if you need me before  TdAp and prevnar today  You need to stop drinking sweet tea, and cut back A LOT on sweets and white starchy foods, increase colored foods , natural or frozen, fruit and vegetable  Your blood sugar is higher than it should be, please make these changes so that you do not become diabetic  hBa1C , anc chem 7 in 4 month, non fasting.  Cholesterol is excellent.  "Replens" is a recommended  moisturizer for intravaginal use

## 2014-06-05 NOTE — Assessment & Plan Note (Signed)
Annual exam as documented. Counseling done  re healthy lifestyle involving commitment to 150 minutes exercise per week, heart healthy diet, and attaining healthy weight.The importance of adequate sleep also discussed. Regular seat belt use and safe storage  of firearms if patient has them, is also discussed. Changes in health habits are decided on by the patient with goals and time frames  set for achieving them. Immunization and cancer screening needs are specifically addressed at this visit.  

## 2014-06-06 LAB — CYTOLOGY - PAP

## 2014-06-06 NOTE — Progress Notes (Signed)
   Subjective:    Patient ID: Laura [name], female    DOB: 1951-10-14, 63 y.o.   MRN: [id number]  HPI Patient is in for annual exam. She reports worsening of her urinary incontinence and will start botox treatments through urology Her right knee is becoming more unstable, and she plans to have orthopedic Doc check on this Recent labs are reviewed, and dietary changes to improve her blood sugar are discussed    Review of Systems See HPI     Objective:   Physical Exam BP 106/70  Pulse 78  Resp 18  Ht 5\' 1"  (1.549 m)  Wt 190 lb 0.6 oz (86.202 kg)  BMI 35.93 kg/m2  SpO2 98% Pleasant well nourished female, alert and oriented x 3, in no cardio-pulmonary distress. Afebrile. HEENT No facial trauma or asymetry. Sinuses non tender.  EOMI, PERTL, fundoscopic exam  no hemorhage or exudate.  External ears normal, tympanic membranes clear. Oropharynx moist, no exudate, good dentition. Neck: supple, no adenopathy,JVD or thyromegaly.No bruits.  Chest: Clear to ascultation bilaterally.No crackles or wheezes. Non tender to palpation  Breast: No asymetry,no masses or lumps. No tenderness. No nipple discharge or inversion. No axillary or supraclavicular adenopathy  Cardiovascular system; Heart sounds normal,  S1 and  S2 ,no S3.  No murmur, or thrill. Apical beat not displaced Peripheral pulses normal.  Abdomen: Soft, non tender, no organomegaly or masses. No bruits. Bowel sounds normal. No guarding, tenderness or rebound.  Rectal:  Normal sphincter tone. No mass.No rectal masses.  Guaiac negative stool.  GU: External genitalia normal female genitalia , female distribution of hair. No lesions. Urethral meatus normal in size, bladder prolapse, no lesions visibly  Present. Bladder non tender. Vagina erythematous, dry, bleeds on  contact, however , pt reports she was just recently sexually active and it was painful , with no visible lesions ,  No discharge present . INAdequate  pelvic support no   rectocele noted, cystocele present  Uterus absent, no adnexal masses, no  adnexal tenderness.   Musculoskeletal exam: Full ROM of spine, hips , shoulders and knees. Right knee deformity  With swelling and  crepitus noted. No muscle wasting or atrophy.   Neurologic: Cranial nerves 2 to 12 intact. Power, tone ,sensation and reflexes normal throughout. No disturbance in gait. No tremor.  Skin: Intact, no ulceration, erythema , scaling or rash noted. Pigmentation normal throughout  Psych; Normal mood and affect. Judgement and concentration normal        Assessment & Plan:  Annual physical exam Annual exam as documented. Counseling done  re healthy lifestyle involving commitment to 150 minutes exercise per week, heart healthy diet, and attaining healthy weight.The importance of adequate sleep also discussed. Regular seat belt use and safe storage  of firearms if patient has them, is also discussed. Changes in health habits are decided on by the patient with goals and time frames  set for achieving them. Immunization and cancer screening needs are specifically addressed at this visit.   Need for Tdap vaccination Vaccine administered  Need for vaccination with 13-polyvalent pneumococcal conjugate vaccine Vaccine administered

## 2014-06-06 NOTE — Assessment & Plan Note (Signed)
Vaccine administered.

## 2014-06-11 ENCOUNTER — Ambulatory Visit: Payer: BC Managed Care – PPO | Admitting: Gastroenterology

## 2014-06-29 ENCOUNTER — Other Ambulatory Visit: Payer: Self-pay | Admitting: Family Medicine

## 2014-07-04 ENCOUNTER — Other Ambulatory Visit: Payer: Self-pay | Admitting: Family Medicine

## 2014-07-17 ENCOUNTER — Telehealth: Payer: Self-pay | Admitting: *Deleted

## 2014-07-17 NOTE — Telephone Encounter (Signed)
Attempted to call patient back to see exactly what symptoms she is having.  No ability to leave message.

## 2014-07-17 NOTE — Telephone Encounter (Signed)
See in notes

## 2014-07-17 NOTE — Telephone Encounter (Signed)
Pt called and LMOM to find out if Dr. Lodema Hong called her in anything to [facility] for her sinuses Please advise [phone]

## 2014-07-18 NOTE — Telephone Encounter (Signed)
See previous message, Toni Amend spoke with patient

## 2014-07-18 NOTE — Telephone Encounter (Signed)
Patient returned call stating that she has had itchy eyes, itchy throat, sinus drainage and ear fullness x 1 week.  Advised to try loratidine and sudafed.  Patient will call back if she continues to have problems.  She is aware that provider is not available until 9/29

## 2014-07-24 ENCOUNTER — Other Ambulatory Visit: Payer: Self-pay | Admitting: Family Medicine

## 2014-07-28 ENCOUNTER — Encounter: Payer: Self-pay | Admitting: Family Medicine

## 2014-07-28 ENCOUNTER — Encounter (INDEPENDENT_AMBULATORY_CARE_PROVIDER_SITE_OTHER): Payer: Self-pay

## 2014-07-28 ENCOUNTER — Ambulatory Visit (INDEPENDENT_AMBULATORY_CARE_PROVIDER_SITE_OTHER): Payer: BC Managed Care – PPO | Admitting: Family Medicine

## 2014-07-28 VITALS — BP 136/78 | HR 76 | Resp 18 | Ht 61.0 in | Wt 195.0 lb

## 2014-07-28 DIAGNOSIS — J329 Chronic sinusitis, unspecified: Secondary | ICD-10-CM | POA: Insufficient documentation

## 2014-07-28 DIAGNOSIS — J328 Other chronic sinusitis: Secondary | ICD-10-CM

## 2014-07-28 DIAGNOSIS — J321 Chronic frontal sinusitis: Secondary | ICD-10-CM

## 2014-07-28 DIAGNOSIS — I10 Essential (primary) hypertension: Secondary | ICD-10-CM

## 2014-07-28 DIAGNOSIS — R32 Unspecified urinary incontinence: Secondary | ICD-10-CM

## 2014-07-28 MED ORDER — CEFTRIAXONE SODIUM 1 G IJ SOLR
500.0000 mg | Freq: Once | INTRAMUSCULAR | Status: AC
  Administered 2014-07-28: 500 mg via INTRAMUSCULAR

## 2014-07-28 MED ORDER — METHYLPREDNISOLONE ACETATE 80 MG/ML IJ SUSP
80.0000 mg | Freq: Once | INTRAMUSCULAR | Status: AC
Start: 2014-07-28 — End: 2014-07-28
  Administered 2014-07-28: 80 mg via INTRAMUSCULAR

## 2014-07-28 MED ORDER — AZITHROMYCIN 250 MG PO TABS: ORAL_TABLET | ORAL | Status: DC

## 2014-07-28 MED ORDER — PREDNISONE 5 MG PO TABS: 5.0000 mg | ORAL_TABLET | Freq: Two times a day (BID) | ORAL | Status: AC

## 2014-07-28 NOTE — Progress Notes (Signed)
   Subjective:    Patient ID: Laura [name], female    DOB: 12-Jul-1951, 63 y.o.   MRN: [id number]  HPI 2 week h/o head pressure, green sinus drainage intermittently, coughing up green sputum, fever and chills. No appetite intolerant of levaquin prescribed last week, loss of voice and taste, bilateral ear pressure Excess wetting accidents associated with cough   Review of Systems See HPI  Denies chest pains, palpitations and leg swelling Denies abdominal pain, nausea, vomiting,diarrhea or constipation.   Persistent back pain and right thigh numbness, intends to see neurosurgery in the near future, thigh numbness is intermittent  Denies depression, anxiety or insomnia. Denies skin break down or rash.        Objective:   Physical Exam BP 136/78  Pulse 76  Resp 18  Ht  (1.549 m)  Wt 195 lb 0.6 oz (88.47 kg)  BMI 36.87 kg/m2  SpO2 97% Patient alert and oriented and in no cardiopulmonary distress.  HEENT: No facial asymmetry, EOMI,   oropharynx pink and moist.  Neck supple no JVD, no mass. Frontal sinus tender, tM clear bilaterally with good light reflex, oropharynx erythematous, no exudate noted, left ant cervical adenitis Chest: Clear to auscultation bilaterally.  CVS: S1, S2 no murmurs, no S3.Regular rate.  ABD: Soft non tender.   Ext: No edema  MS: Adequate ROM spine, shoulders, hips and knees.  Skin: Intact, no ulcerations or rash noted.  Psych: Good eye contact, normal affect. Memory intact not anxious or depressed appearing.  CNS: CN 2-12 intact, power,  normal throughout.no focal deficits noted.        Assessment & Plan:  Sinusitis, chronic Antibiotic at OV and also prescribed, also short steroid course  HYPERTENSION Controlled, no change in medication   URINARY INCONTINENCE Worse with excess cough, but overall improved following surgery and on med by urology

## 2014-07-28 NOTE — Patient Instructions (Signed)
F/u as before  You are treated for sinusitis, Rocephin and depo medrol in office and z pack and 5 day course of prednisone has been prescribed  You need to return in 2 weeks for flu   Vaccine, nurse visit only  F/u back problem to help with evaluation of thigh pain  As discussed

## 2014-07-29 NOTE — Assessment & Plan Note (Signed)
Worse with excess cough, but overall improved following surgery and on med by urology

## 2014-07-29 NOTE — Assessment & Plan Note (Signed)
Controlled, no change in medication  

## 2014-07-29 NOTE — Assessment & Plan Note (Signed)
Antibiotic at OV and also prescribed, also short steroid course

## 2014-07-30 ENCOUNTER — Other Ambulatory Visit: Payer: Self-pay | Admitting: Family Medicine

## 2014-07-30 DIAGNOSIS — Z139 Encounter for screening, unspecified: Secondary | ICD-10-CM

## 2014-08-04 ENCOUNTER — Telehealth: Payer: Self-pay

## 2014-08-04 ENCOUNTER — Ambulatory Visit (HOSPITAL_COMMUNITY)
Admission: RE | Admit: 2014-08-04 | Discharge: 2014-08-04 | Disposition: A | Discharge status: Home / Self Care | Payer: BC Managed Care – PPO | Source: Ambulatory Visit | Attending: Family Medicine | Admitting: Family Medicine

## 2014-08-04 DIAGNOSIS — R51 Headache: Secondary | ICD-10-CM | POA: Insufficient documentation

## 2014-08-04 DIAGNOSIS — J3489 Other specified disorders of nose and nasal sinuses: Secondary | ICD-10-CM

## 2014-08-04 IMAGING — CR DG SINUSES COMPLETE 3+V
3 series · 3 of 3 positions shown · non-contrast
Comparison: None

CLINICAL DATA: Pain and sinus pressure in the region of the frontal
sinus for 2 weeks ; initial visit

EXAM:
PARANASAL SINUSES - COMPLETE 3 + VIEW

[view not recorded (1 of 3)]
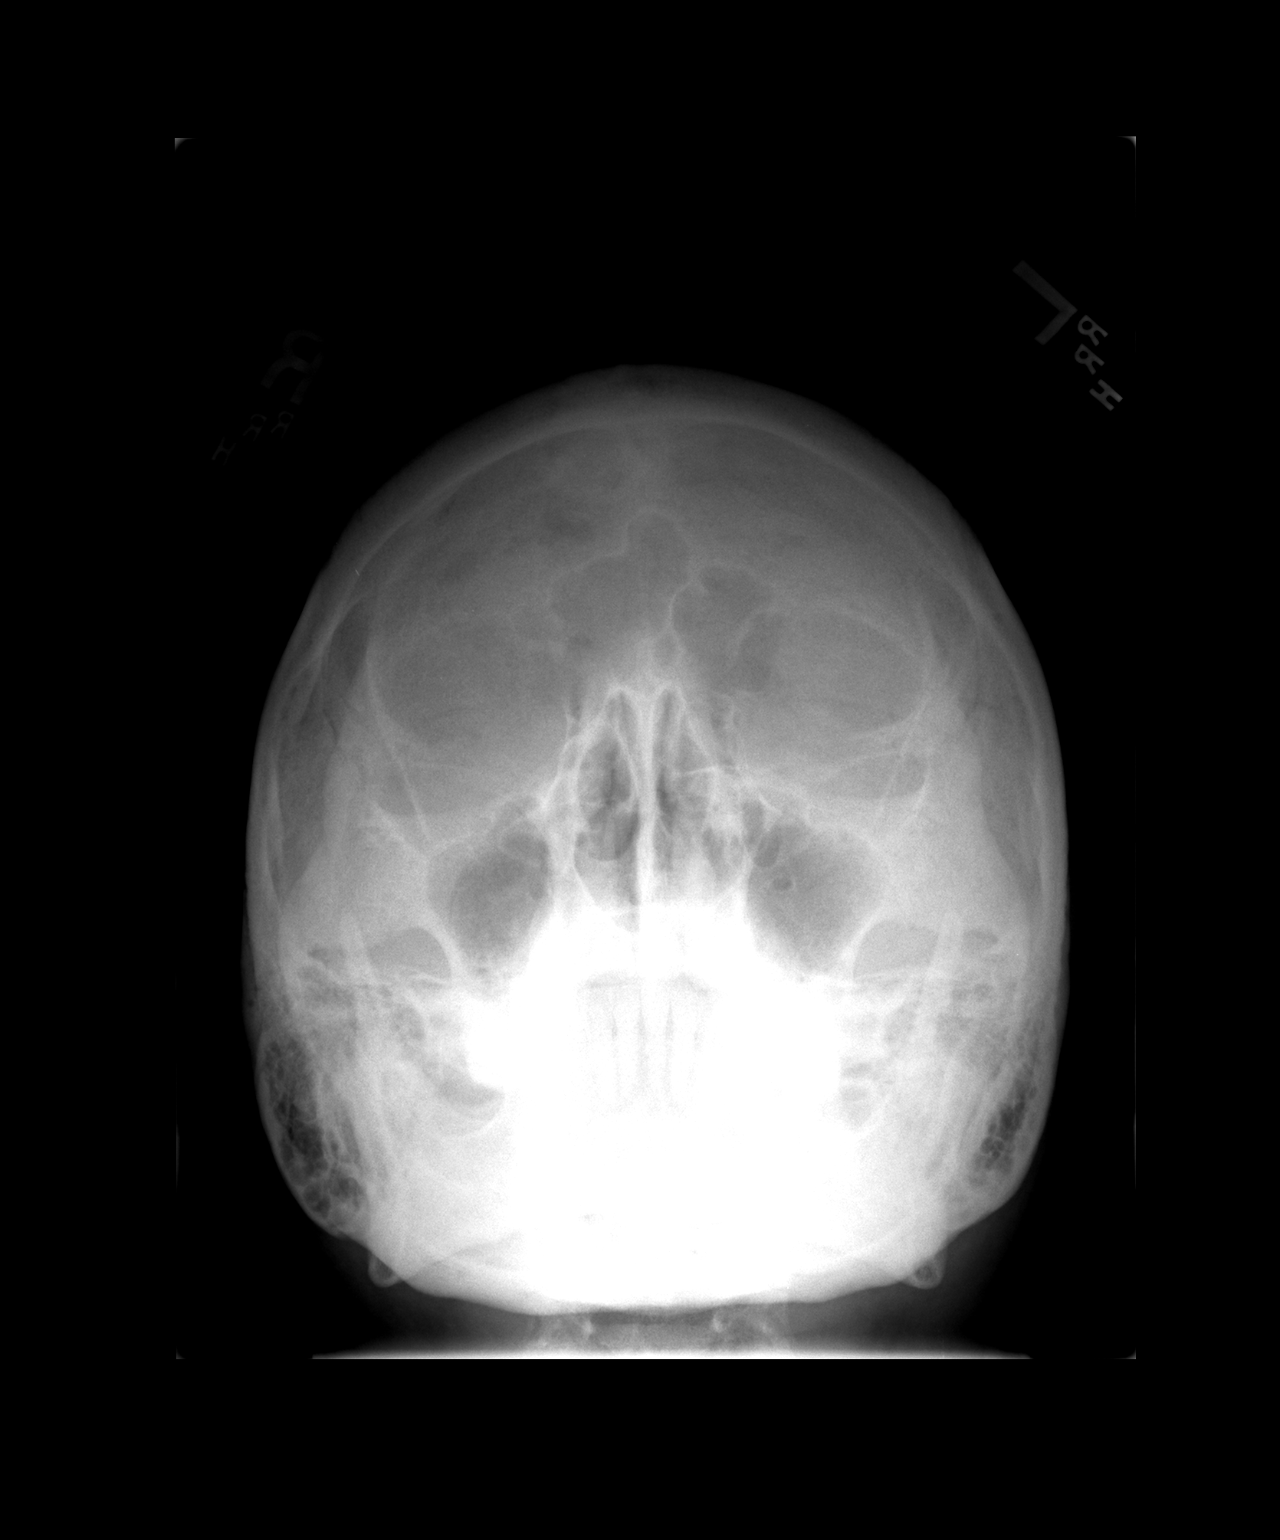

[view not recorded (2 of 3)]
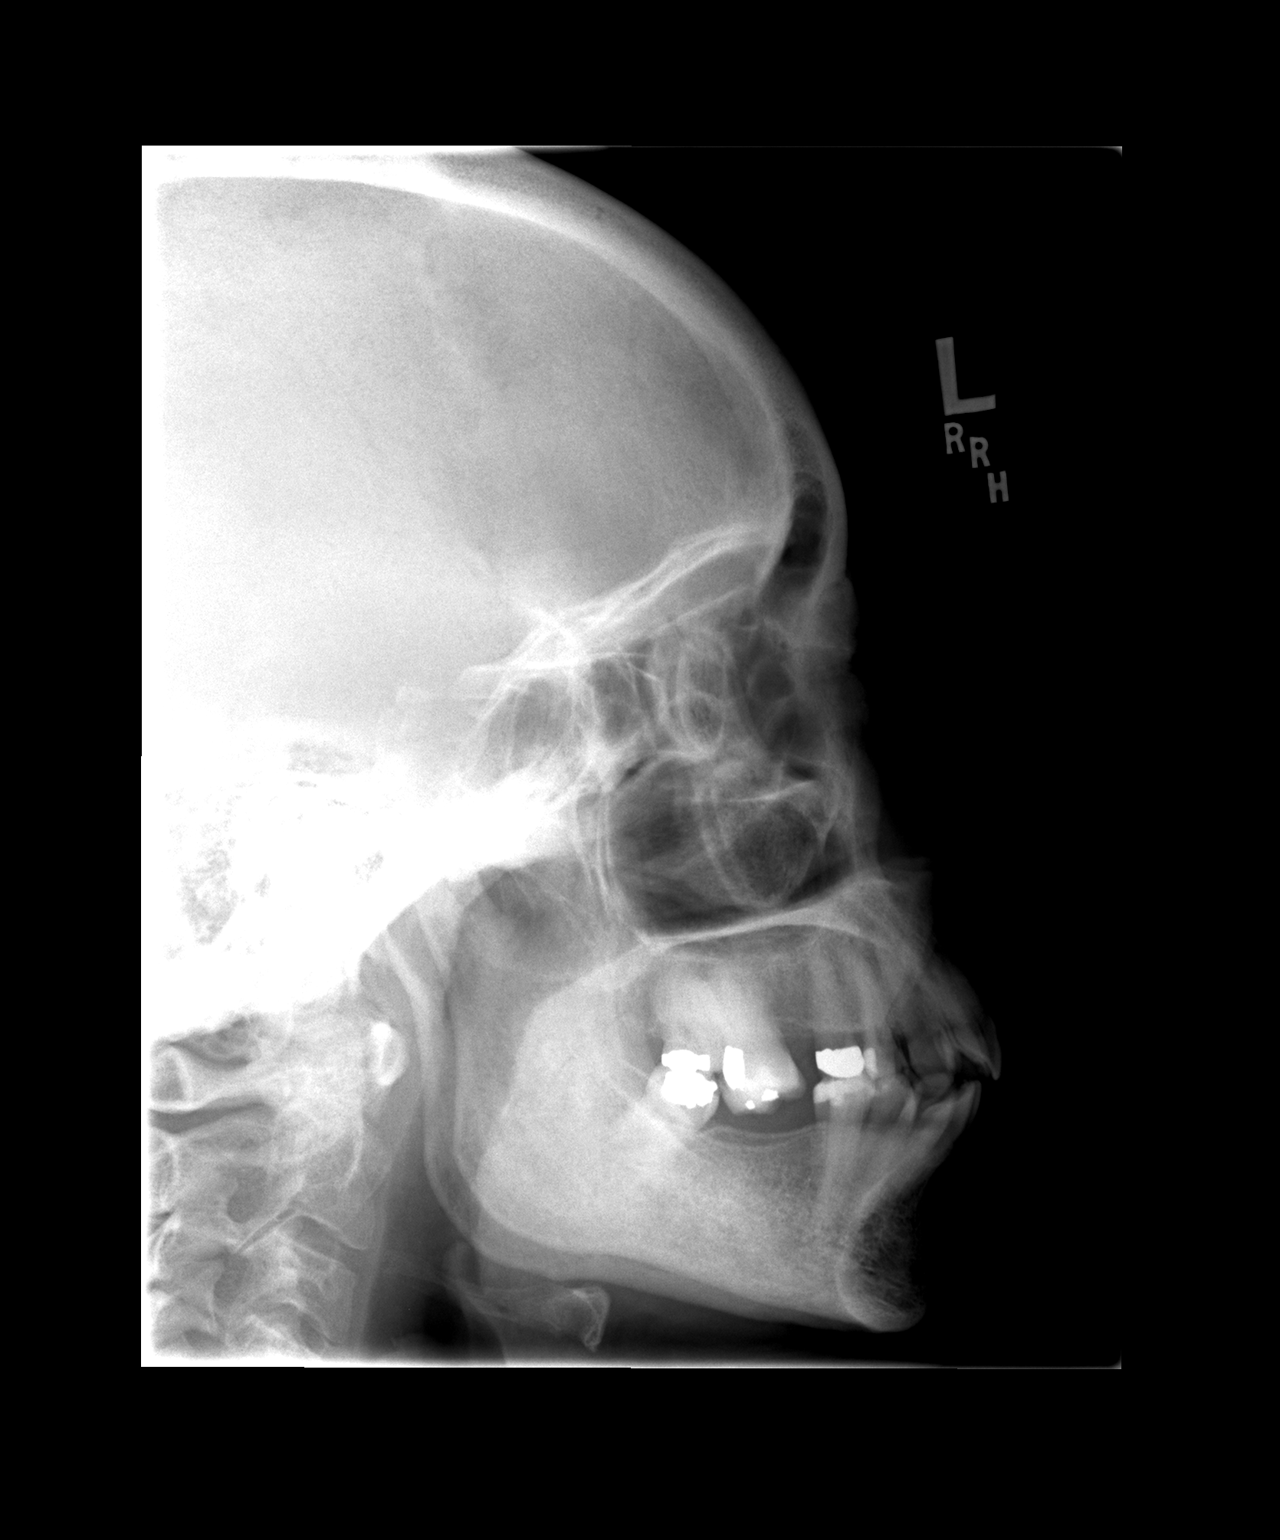

[view not recorded (3 of 3)]
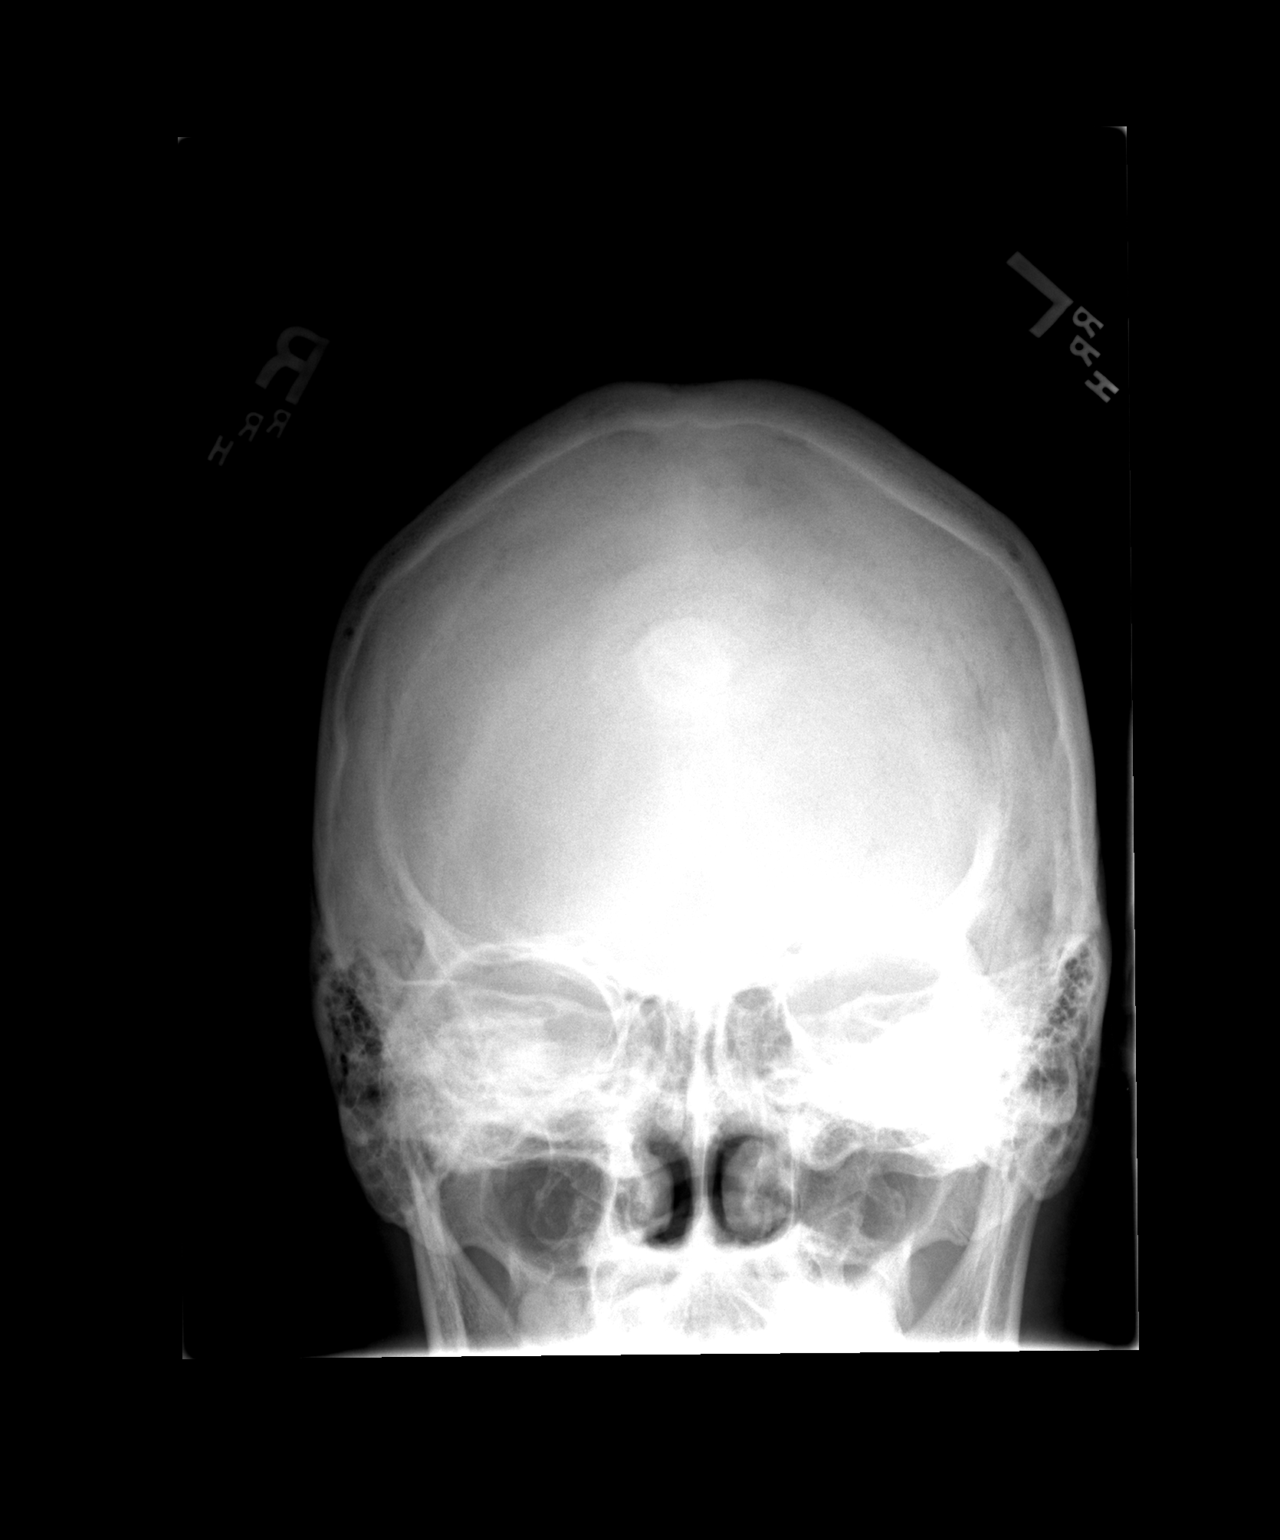

[3 of 3 positions shown; findings below may reference images not displayed]

FINDINGS: The frontal sinuses are well pneumatized. There is no mucoperiosteal
thickening or air-fluid level. The maxillary, ethmoid, and sphenoid
sinuses are clear where visualized.
IMPRESSION: There is no plain film evidence of acute or chronic frontal
sinusitis.

## 2014-08-04 NOTE — Addendum Note (Signed)
Addended by: Kandis FantasiaSLADE, Steele Ledonne B on: 08/04/2014 12:16 PM   Modules accepted: Orders

## 2014-08-04 NOTE — Telephone Encounter (Signed)
Patient states that she has had no fever or green sinus drainage.  Will go and have xray.

## 2014-08-04 NOTE — Telephone Encounter (Signed)
pls document if she has fever , chills or green drainage and order xray of sinnus, I will sign, likely problem is uncontrolled allergies and I do not want to prescribe antribiotic again without established infection

## 2014-08-06 ENCOUNTER — Telehealth: Payer: Self-pay | Admitting: Family Medicine

## 2014-08-07 ENCOUNTER — Other Ambulatory Visit: Payer: Self-pay

## 2014-08-07 DIAGNOSIS — J328 Other chronic sinusitis: Secondary | ICD-10-CM

## 2014-08-07 DIAGNOSIS — J321 Chronic frontal sinusitis: Secondary | ICD-10-CM

## 2014-08-07 MED ORDER — LORATADINE 10 MG PO TABS: 10.0000 mg | ORAL_TABLET | Freq: Every day | ORAL | Status: DC

## 2014-08-07 NOTE — Telephone Encounter (Signed)
Med sent to pharmacy.

## 2014-08-08 ENCOUNTER — Other Ambulatory Visit: Payer: Self-pay | Admitting: Family Medicine

## 2014-08-28 ENCOUNTER — Encounter (INDEPENDENT_AMBULATORY_CARE_PROVIDER_SITE_OTHER): Payer: Self-pay

## 2014-08-28 ENCOUNTER — Ambulatory Visit (INDEPENDENT_AMBULATORY_CARE_PROVIDER_SITE_OTHER): Payer: BC Managed Care – PPO

## 2014-08-28 DIAGNOSIS — Z23 Encounter for immunization: Secondary | ICD-10-CM

## 2014-09-08 ENCOUNTER — Ambulatory Visit (HOSPITAL_COMMUNITY)
Admission: RE | Admit: 2014-09-08 | Discharge: 2014-09-08 | Disposition: A | Discharge status: Home / Self Care | Payer: BC Managed Care – PPO | Source: Ambulatory Visit | Attending: Family Medicine | Admitting: Family Medicine

## 2014-09-08 DIAGNOSIS — Z1231 Encounter for screening mammogram for malignant neoplasm of breast: Secondary | ICD-10-CM | POA: Diagnosis not present

## 2014-09-08 DIAGNOSIS — Z139 Encounter for screening, unspecified: Secondary | ICD-10-CM

## 2014-09-22 ENCOUNTER — Telehealth: Payer: Self-pay | Admitting: Family Medicine

## 2014-09-22 NOTE — Telephone Encounter (Signed)
Appointment given for 11/24

## 2014-09-23 ENCOUNTER — Encounter: Payer: Self-pay | Admitting: Family Medicine

## 2014-09-23 ENCOUNTER — Ambulatory Visit (INDEPENDENT_AMBULATORY_CARE_PROVIDER_SITE_OTHER): Payer: BC Managed Care – PPO | Admitting: Family Medicine

## 2014-09-23 VITALS — BP 130/82 | HR 92 | Resp 16 | Ht 61.0 in | Wt 192.0 lb

## 2014-09-23 DIAGNOSIS — E785 Hyperlipidemia, unspecified: Secondary | ICD-10-CM

## 2014-09-23 DIAGNOSIS — I10 Essential (primary) hypertension: Secondary | ICD-10-CM

## 2014-09-23 DIAGNOSIS — F411 Generalized anxiety disorder: Secondary | ICD-10-CM

## 2014-09-23 DIAGNOSIS — M533 Sacrococcygeal disorders, not elsewhere classified: Secondary | ICD-10-CM

## 2014-09-23 DIAGNOSIS — M5431 Sciatica, right side: Secondary | ICD-10-CM

## 2014-09-23 DIAGNOSIS — M545 Low back pain: Secondary | ICD-10-CM

## 2014-09-23 DIAGNOSIS — M79604 Pain in right leg: Secondary | ICD-10-CM

## 2014-09-23 DIAGNOSIS — M79605 Pain in left leg: Secondary | ICD-10-CM

## 2014-09-23 MED ORDER — LOVASTATIN 40 MG PO TABS: ORAL_TABLET | ORAL | Status: DC

## 2014-09-23 MED ORDER — KETOROLAC TROMETHAMINE 60 MG/2ML IM SOLN
60.0000 mg | Freq: Once | INTRAMUSCULAR | Status: AC
  Administered 2014-09-23: 60 mg via INTRAMUSCULAR

## 2014-09-23 MED ORDER — HYDROCODONE-ACETAMINOPHEN 5-325 MG PO TABS: ORAL_TABLET | ORAL | Status: DC

## 2014-09-23 MED ORDER — IMIPRAMINE HCL 25 MG PO TABS: 25.0000 mg | ORAL_TABLET | Freq: Every day | ORAL | Status: DC

## 2014-09-23 MED ORDER — LOSARTAN POTASSIUM 25 MG PO TABS: 25.0000 mg | ORAL_TABLET | Freq: Every day | ORAL | Status: DC

## 2014-09-23 MED ORDER — METHYLPREDNISOLONE ACETATE 80 MG/ML IJ SUSP
80.0000 mg | Freq: Once | INTRAMUSCULAR | Status: AC
  Administered 2014-09-23: 80 mg via INTRAMUSCULAR

## 2014-09-23 MED ORDER — PREDNISONE (PAK) 5 MG PO TABS: 5.0000 mg | ORAL_TABLET | ORAL | Status: DC

## 2014-09-23 MED ORDER — CITALOPRAM HYDROBROMIDE 10 MG PO TABS: 10.0000 mg | ORAL_TABLET | Freq: Every day | ORAL | Status: DC

## 2014-09-23 NOTE — Progress Notes (Signed)
   Subjective:    Patient ID: Laura EstimableJudy C [name], female    DOB: [birth year], 63 y.o.   MRN: [id number]  HPI Localized right SI joint pain x 3 days, keeps her awake at night , no specific aggravating ,factor. Denies  Incontinence of stool or urine . Denies lower extremity weakness or numbness   Review of Systems See HPI Denies recent fever or chills. Denies sinus pressure, nasal congestion, ear pain or sore throat. Denies chest congestion, productive cough or wheezing. Denies chest pains, palpitations and leg swelling Denies abdominal pain, nausea, vomiting,diarrhea or constipation.   Denies dysuria, frequency, hesitancy or incontinence.  Denies skin break down or rash.        Objective:   Physical Exam BP 130/82 mmHg  Pulse 92  Resp 16  Ht 5\' 1"  (1.549 m)  Wt 192 lb (87.091 kg)  BMI 36.30 kg/m2  SpO2 97% Patient alert and oriented and in no cardiopulmonary distress.  HEENT: No facial asymmetry, EOMI,   oropharynx pink and moist.  Neck supple no JVD, no mass.  Chest: Clear to auscultation bilaterally.  CVS: S1, S2 no murmurs, no S3.Regular rate.  ABD: Soft non tender.   Ext: No edema  MS: Adequate though reduced  ROM spine, adequate in shoulders, hips and knees.    Psych: Good eye contact, normal affect. Memory intact not anxious or depressed appearing.  CNS: CN 2-12 intact, power,  normal throughout.no focal deficits noted.        Assessment & Plan:  Sciatica 3 day history Toradol and depo medrol in office then prednisone and hydrocodone  Sacro ilial pain Acute SI joint pain , anti inflammatories in office then orally  Essential hypertension Controlled, no change in medication DASH diet and commitment to daily physical activity for a minimum of 30 minutes discussed and encouraged, as a part of hypertension management. The importance of attaining a healthy weight is also discussed.   GAD (generalized anxiety disorder) Uncontrolled, need to resume  medication  Hyperlipidemia LDL goal <100 Controlled Hyperlipidemia:Low fat diet discussed and encouraged.  Updated lab needed at/ before next visit.

## 2014-09-23 NOTE — Assessment & Plan Note (Signed)
3 day history Toradol and depo medrol in office then prednisone and hydrocodone

## 2014-09-23 NOTE — Patient Instructions (Signed)
F/u as before  Toradol and depo medrol in office today, prednisone sent in for pain and hydrocodone at bedtime for pain (7 tabs only)  New for anxiety is citaolpram, daily, and  Work on your attitude

## 2014-09-27 NOTE — Assessment & Plan Note (Signed)
Acute SI joint pain , anti inflammatories in office then orally

## 2014-09-27 NOTE — Assessment & Plan Note (Signed)
Controlled Hyperlipidemia:Low fat diet discussed and encouraged.  Updated lab needed at/ before next visit.

## 2014-09-27 NOTE — Assessment & Plan Note (Signed)
Uncontrolled, need to resume medication

## 2014-09-27 NOTE — Assessment & Plan Note (Signed)
Controlled, no change in medication DASH diet and commitment to daily physical activity for a minimum of 30 minutes discussed and encouraged, as a part of hypertension management. The importance of attaining a healthy weight is also discussed.  

## 2014-10-01 ENCOUNTER — Ambulatory Visit (INDEPENDENT_AMBULATORY_CARE_PROVIDER_SITE_OTHER): Payer: BC Managed Care – PPO | Admitting: Gastroenterology

## 2014-10-01 VITALS — BP 135/81 | HR 75 | Temp 97.7°F | Ht 64.0 in | Wt 192.0 lb

## 2014-10-01 DIAGNOSIS — K5901 Slow transit constipation: Secondary | ICD-10-CM

## 2014-10-01 MED ORDER — LINACLOTIDE 145 MCG PO CAPS: ORAL_CAPSULE | ORAL | Status: DC

## 2014-10-01 NOTE — Patient Instructions (Signed)
MIX MIRALAX IN 8 OZ OF WATER OR LIQUID. TAKE LINZESS AND TWO IBGARD USING LIQUID CONTAINING MIRALAX.  DRINK WATER TO KEEP YOUR URINE LIGHT YELLOW.  FOLLOW A HIGH FIBER DIET. SEE INFO BELOW.  FOLLOW UP IN 6 MOS.    High-Fiber Diet A high-fiber diet changes your normal diet to include more whole grains, legumes, fruits, and vegetables. Changes in the diet involve replacing refined carbohydrates with unrefined foods. The calorie level of the diet is essentially unchanged. The Dietary Reference Intake (recommended amount) for adult males is 38 grams per day. For adult females, it is 25 grams per day. Pregnant and lactating women should consume 28 grams of fiber per day. Fiber is the intact part of a plant that is not broken down during digestion. Functional fiber is fiber that has been isolated from the plant to provide a beneficial effect in the body. PURPOSE  Increase stool bulk.   Ease and regulate bowel movements.   Lower cholesterol.  INDICATIONS THAT YOU NEED MORE FIBER  Constipation and hemorrhoids.   Uncomplicated diverticulosis (intestine condition) and irritable bowel syndrome.   Weight management.   As a protective measure against hardening of the arteries (atherosclerosis), diabetes, and cancer.   GUIDELINES FOR INCREASING FIBER IN THE DIET  Start adding fiber to the diet slowly. A gradual increase of about 5 more grams (2 slices of whole-wheat bread, 2 servings of most fruits or vegetables, or 1 bowl of high-fiber cereal) per day is best. Too rapid an increase in fiber may result in constipation, flatulence, and bloating.   Drink enough water and fluids to keep your urine clear or pale yellow. Water, juice, or caffeine-free drinks are recommended. Not drinking enough fluid may cause constipation.   Eat a variety of high-fiber foods rather than one type of fiber.   Try to increase your intake of fiber through using high-fiber foods rather than fiber pills or supplements  that contain small amounts of fiber.   The goal is to change the types of food eaten. Do not supplement your present diet with high-fiber foods, but replace foods in your present diet.  INCLUDE A VARIETY OF FIBER SOURCES  Replace refined and processed grains with whole grains, canned fruits with fresh fruits, and incorporate other fiber sources. White rice, white breads, and most bakery goods contain little or no fiber.   Brown whole-grain rice, buckwheat oats, and many fruits and vegetables are all good sources of fiber. These include: broccoli, Brussels sprouts, cabbage, cauliflower, beets, sweet potatoes, white potatoes (skin on), carrots, tomatoes, eggplant, squash, berries, fresh fruits, and dried fruits.   Cereals appear to be the richest source of fiber. Cereal fiber is found in whole grains and bran. Bran is the fiber-rich outer coat of cereal grain, which is largely removed in refining. In whole-grain cereals, the bran remains. In breakfast cereals, the largest amount of fiber is found in those with "bran" in their names. The fiber content is sometimes indicated on the label.   You may need to include additional fruits and vegetables each day.   In baking, for 1 cup white flour, you may use the following substitutions:   1 cup whole-wheat flour minus 2 tablespoons.   1/2 cup white flour plus 1/2 cup whole-wheat flour.

## 2014-10-01 NOTE — Assessment & Plan Note (Signed)
SX EXACERBATED BY NARCOTIC USE.  MIX MIRALAX IN 8 OZ OF WATER OR LIQUID. TAKE LINZESS AND TWO IBGARD USING LIQUID CONTAINING MIRALAX. DRINK WATER TO KEEP YOUR URINE LIGHT YELLOW. FOLLOW A HIGH FIBER DIET.  FOLLOW UP IN 6 MOS.

## 2014-10-01 NOTE — Progress Notes (Signed)
ON RECALL  °

## 2014-10-01 NOTE — Progress Notes (Signed)
cc'ed to pcp °

## 2014-10-01 NOTE — Progress Notes (Signed)
Subjective:    Patient ID: Laura [name], female    DOB: 1951/04/17, 63 y.o.   MRN: [id number]  Laura OvermanMargaret Simpson, MD  HPI TROUBLE WITH BMs FOR A LONG TIME, BUT WORSE FOR 3 DAYS. ASSOCIATED WITH ABD PAIN. LAST TSH?? THANKSGIVING HAD TROUBLE WITH REGURGITATION.TAKING LINZESS 145 WAS GOING WAS GOING EVERY DAY. CHANGE IN MEDS: HYDROCODONE. HAVING TROUBLE WITH BACK PAIN AND SCIATICA. RARE EPIGASTRIC PAIN: FEW MINS, EVERY DAY MOSTLY IN THE AM. NO ASPIRIN, BC/GOODY POWDERS, IBUPROFEN/MOTRIN, OR NAPROXEN/ALEVE. NO ETOH.  PT DENIES FEVER, CHILLS, HEMATOCHEZIA, nausea, vomiting, melena, diarrhea, CHEST PAIN, SHORTNESS OF BREATH, OR problems swallowing.  Past Medical History  Diagnosis Date  . GERD (gastroesophageal reflux disease)   . Gastritis   . Hypertension   . Hyperlipidemia   . Anemia   . Cystitis   . Osteopenia   . Shoulder pain   . Hypothyroidism   . Hashimoto's thyroiditis     Hx   . Urinary incontinence   . Anxiety   . Depression    Past Surgical History  Procedure Laterality Date  . Total abdominal hysterectomy  1994  . Umbilical hernia repair    . Breast excisional biopsy  2010    Excisional biopsy of benign left breast mass -lopoma   . Resection of left lobe of thyroid    . Bilateral tubal ligation    . Right carpal tunnel release    . Urethral dilation for stenosis  2009  . Rt. knee athroscopy  2004  . Colonscopy  2005    Dr. Katrinka BlazingSmith  . Esophagogastroduodenoscopy  12/2009    chronic gastritis  . Breast surgery      left nreast biopsy  . Colonoscopy  08/26/2011    Procedure: COLONOSCOPY;  Surgeon: Arlyce HarmanSandi M Fields, MD;  Location: [facility];  Service: Endoscopy;  Laterality: N/A;  8:30   Allergies  Allergen Reactions  . Dilaudid [Hydromorphone Hcl]   . Levaquin [Levofloxacin] Other (See Comments)    Dry throat, no raste  . Nsaids     REACTION: per GI pt no longer to use NSAIDS   Current Outpatient Prescriptions  Medication Sig Dispense Refill  . citalopram  (CELEXA) 10 MG tablet Take 1 tablet (10 mg total) by mouth daily.    . hydrochlorothiazide (HYDRODIURIL) 25 MG tablet TAKE 1 TABLET BY MOUTH DAILY    . HYDROcodone-acetaminophen (NORCO/VICODIN) 5-325 MG per tablet One tablet at bedtime , as needed, for uncontrolled pain    . imipramine (TOFRANIL) 25 MG tablet Take 1 tablet (25 mg total) by mouth at bedtime.    Marland Kitchen. levothyroxine (SYNTHROID, LEVOTHROID) 112 MCG tablet TAKE 1 TABLET BY MOUTH EVERY MORNING    . Linaclotide (LINZESS) 145 MCG CAPS capsule 1 PO 30 mins prior to your first meal    . loratadine (CLARITIN) 10 MG tablet Take 1 tablet (10 mg total) by mouth daily.    Marland Kitchen. losartan (COZAAR) 25 MG tablet Take 1 tablet (25 mg total) by mouth daily.    Marland Kitchen. lovastatin (MEVACOR) 40 MG tablet TAKE 2 TABLETS BY MOUTH EVERY NIGHT AT BEDTIME FOR CHOLESTEROL    . pantoprazole (PROTONIX) 40 MG tablet TAKE 1 TABLET BY MOUTH TWICE DAILY    . polyethylene glycol powder (GLYCOLAX/MIRALAX) powder MIX 1 CAPFUL (17 GRAMS) WITH 8OZ OF WATER OR JUICE TWICE DAILY.    Marland Kitchen. predniSONE (STERAPRED UNI-PAK) 5 MG TABS tablet Take 1 tablet (5 mg total) by mouth as directed.  Review of Systems     Objective:   Physical Exam  Constitutional: She is oriented to person, place, and time. She appears well-developed and well-nourished. No distress.  HENT:  Head: Normocephalic and atraumatic.  Mouth/Throat: Oropharynx is clear and moist. No oropharyngeal exudate.  Eyes: Pupils are equal, round, and reactive to light. No scleral icterus.  Neck: Normal range of motion. Neck supple.  Cardiovascular: Normal rate, regular rhythm and normal heart sounds.   Pulmonary/Chest: Effort normal and breath sounds normal. No respiratory distress.  Abdominal: Soft. Bowel sounds are normal. She exhibits no distension. There is tenderness. There is no rebound and no guarding.  MILD TTP IN THE EPIGASTRIUM    Musculoskeletal: She exhibits no edema.  Lymphadenopathy:    She has no  cervical adenopathy.  Neurological: She is alert and oriented to person, place, and time.  NO  NEW FOCAL DEFICITS   Psychiatric: She has a normal mood and affect.  Vitals reviewed.         Assessment & Plan:

## 2014-10-05 LAB — BASIC METABOLIC PANEL
BUN: 17 mg/dL (ref 6–23)
CHLORIDE: 99 meq/L (ref 96–112)
CO2: 30 mEq/L (ref 19–32)
Calcium: 9.3 mg/dL (ref 8.4–10.5)
Creat: 0.93 mg/dL (ref 0.50–1.10)
GLUCOSE: 93 mg/dL (ref 70–99)
POTASSIUM: 4 meq/L (ref 3.5–5.3)
Sodium: 138 mEq/L (ref 135–145)

## 2014-10-05 LAB — HEMOGLOBIN A1C
Hgb A1c MFr Bld: 5.9 % — ABNORMAL HIGH (ref <5.7)
Mean Plasma Glucose: 123 mg/dL — ABNORMAL HIGH (ref <117)

## 2014-10-08 ENCOUNTER — Ambulatory Visit (INDEPENDENT_AMBULATORY_CARE_PROVIDER_SITE_OTHER): Payer: BC Managed Care – PPO | Admitting: Family Medicine

## 2014-10-08 ENCOUNTER — Encounter: Payer: Self-pay | Admitting: Family Medicine

## 2014-10-08 VITALS — BP 140/84 | HR 94 | Resp 16 | Ht 64.0 in | Wt 193.0 lb

## 2014-10-08 DIAGNOSIS — J309 Allergic rhinitis, unspecified: Secondary | ICD-10-CM | POA: Insufficient documentation

## 2014-10-08 DIAGNOSIS — IMO0001 Reserved for inherently not codable concepts without codable children: Secondary | ICD-10-CM

## 2014-10-08 DIAGNOSIS — N3946 Mixed incontinence: Secondary | ICD-10-CM

## 2014-10-08 DIAGNOSIS — E785 Hyperlipidemia, unspecified: Secondary | ICD-10-CM

## 2014-10-08 DIAGNOSIS — F411 Generalized anxiety disorder: Secondary | ICD-10-CM

## 2014-10-08 DIAGNOSIS — J3089 Other allergic rhinitis: Secondary | ICD-10-CM

## 2014-10-08 DIAGNOSIS — I10 Essential (primary) hypertension: Secondary | ICD-10-CM

## 2014-10-08 DIAGNOSIS — R7302 Impaired glucose tolerance (oral): Secondary | ICD-10-CM

## 2014-10-08 NOTE — Assessment & Plan Note (Signed)
Deteriorated Patient educated about the importance of limiting  Carbohydrate intake , the need to commit to daily physical activity for a minimum of 30 minutes , and to commit weight loss. The fact that changes in all these areas will reduce or eliminate all together the development of diabetes is stressed.    

## 2014-10-08 NOTE — Progress Notes (Signed)
   Subjective:    Patient ID: Laura [name], female    DOB: October 01, 1951, 63 y.o.   MRN: [id number]  HPI  The PT is here for follow up and re-evaluation of chronic medical conditions, medication management and review of any available recent lab and radiology data.  Preventive health is updated, specifically  Cancer screening and Immunization.   The PT denies any adverse reactions to current medications since the last visit. Has noted some cramping in the  Feet and excessive sweating since recently stopping high dose steroid, otherwise no concerns    Review of Systems See HPI Denies recent fever or chills. Denies sinus pressure, nasal congestion, ear pain or sore throat. Denies chest congestion, productive cough or wheezing. Denies chest pains, palpitations and leg swelling Denies abdominal pain, nausea, vomiting,diarrhea or constipation.   Denies dysuria, frequency, hesitancy or incontinence. Denies joint pain, swelling and limitation in mobility. Denies headaches, seizures, numbness, or tingling. Denies depression, anxiety or insomnia. Denies skin break down or rash.        Objective:   Physical Exam BP 140/84 mmHg  Pulse 94  Resp 16  Ht 5\' 4"  (1.626 m)  Wt 193 lb (87.544 kg)  BMI 33.11 kg/m2  SpO2 97% Patient alert and oriented and in no cardiopulmonary distress.  HEENT: No facial asymmetry, EOMI,   oropharynx pink and moist.  Neck supple no JVD, no mass.  Chest: Clear to auscultation bilaterally.  CVS: S1, S2 no murmurs, no S3.Regular rate.  ABD: Soft non tender.   Ext: No edema  MS: Adequate ROM spine, shoulders, hips and knees.  Skin: Intact, no ulcerations or rash noted.  Psych: Good eye contact, normal affect. Memory intact not anxious or depressed appearing.  CNS: CN 2-12 intact, power,  normal throughout.no focal deficits noted.        Assessment & Plan:

## 2014-10-08 NOTE — Assessment & Plan Note (Signed)
Controlled, no change in medication  

## 2014-10-08 NOTE — Assessment & Plan Note (Signed)
Controlled, no change in medication Updated lab needed at/ before next visit. Hyperlipidemia:Low fat diet discussed and encouraged.   

## 2014-10-08 NOTE — Assessment & Plan Note (Signed)
Controlled, no change in medication DASH diet and commitment to daily physical activity for a minimum of 30 minutes discussed and encouraged, as a part of hypertension management. The importance of attaining a healthy weight is also discussed.  

## 2014-10-08 NOTE — Patient Instructions (Addendum)
F/u in 4 month, call if you need me before  Blood sugar is too high, pLS drink ONLY water, stop  sweet tea and juice  Sweats and cramping may  Be due to recent steroid. Blood work is good  Mustard found to be useful for cramps  Commit to exercise 5 days per week, [facility]

## 2014-10-08 NOTE — Assessment & Plan Note (Signed)
Deteriorated. Patient re-educated about  the importance of commitment to a  minimum of 150 minutes of exercise per week. The importance of healthy food choices with portion control discussed. Encouraged to start a food diary, count calories and to consider  joining a support group. Sample diet sheets offered. Goals set by the patient for the next several months.    

## 2014-10-19 ENCOUNTER — Other Ambulatory Visit: Payer: Self-pay | Admitting: Family Medicine

## 2014-10-26 ENCOUNTER — Other Ambulatory Visit: Payer: Self-pay | Admitting: Family Medicine

## 2014-11-06 ENCOUNTER — Telehealth: Payer: Self-pay

## 2014-11-06 NOTE — Telephone Encounter (Signed)
Walked in office. Hit her Left index finger/top of hand on her washing machine yesterday and it is puffy and some redness. Doesn't think its broke so declined xray. Wanted to know what to do for the pain and inflammation. States tylenol/advil doesn't help it.

## 2014-11-06 NOTE — Telephone Encounter (Signed)
Left message for pt

## 2014-11-06 NOTE — Telephone Encounter (Signed)
I recommend cold compresses to swelling 3 times daily and tylenol for pain also x ray of the hand to ensure no bony injury Explain after ANY injurytime is necessary for pain and swelling to g away, so give herself 3 to 5 days and stick with the plan

## 2014-11-07 ENCOUNTER — Telehealth: Payer: Self-pay | Admitting: Family Medicine

## 2014-11-07 ENCOUNTER — Ambulatory Visit (HOSPITAL_COMMUNITY)
Admission: RE | Admit: 2014-11-07 | Discharge: 2014-11-07 | Disposition: A | Discharge status: Home / Self Care | Payer: 59 | Source: Ambulatory Visit | Attending: Family Medicine | Admitting: Family Medicine

## 2014-11-07 DIAGNOSIS — M79642 Pain in left hand: Secondary | ICD-10-CM

## 2014-11-07 IMAGING — CR DG HAND COMPLETE 3+V*L*
3 series · 3 of 3 positions shown · non-contrast
Comparison: None.

CLINICAL DATA: 63-year-old female with left index finger pain. Pain
at the metacarpophalangeal joint.

EXAM:
LEFT HAND - COMPLETE 3+ VIEW

[view not recorded (1 of 3)]
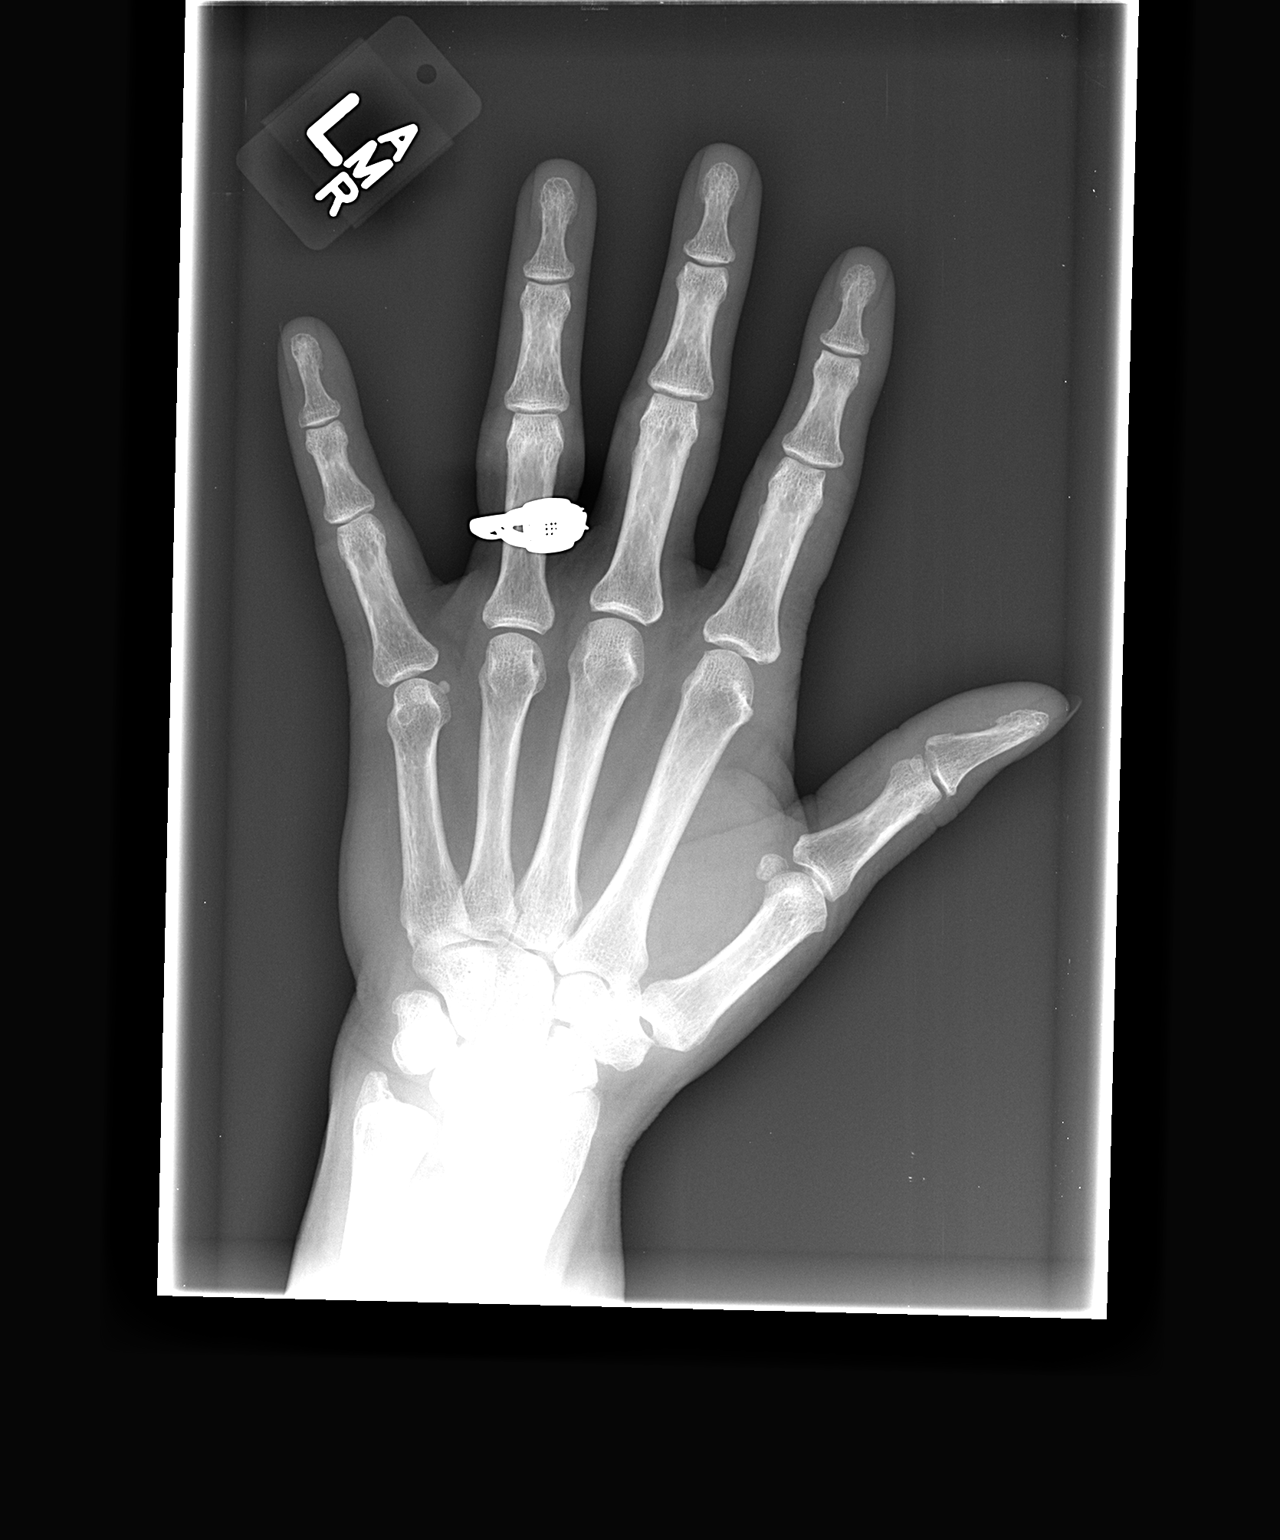

[view not recorded (2 of 3)]
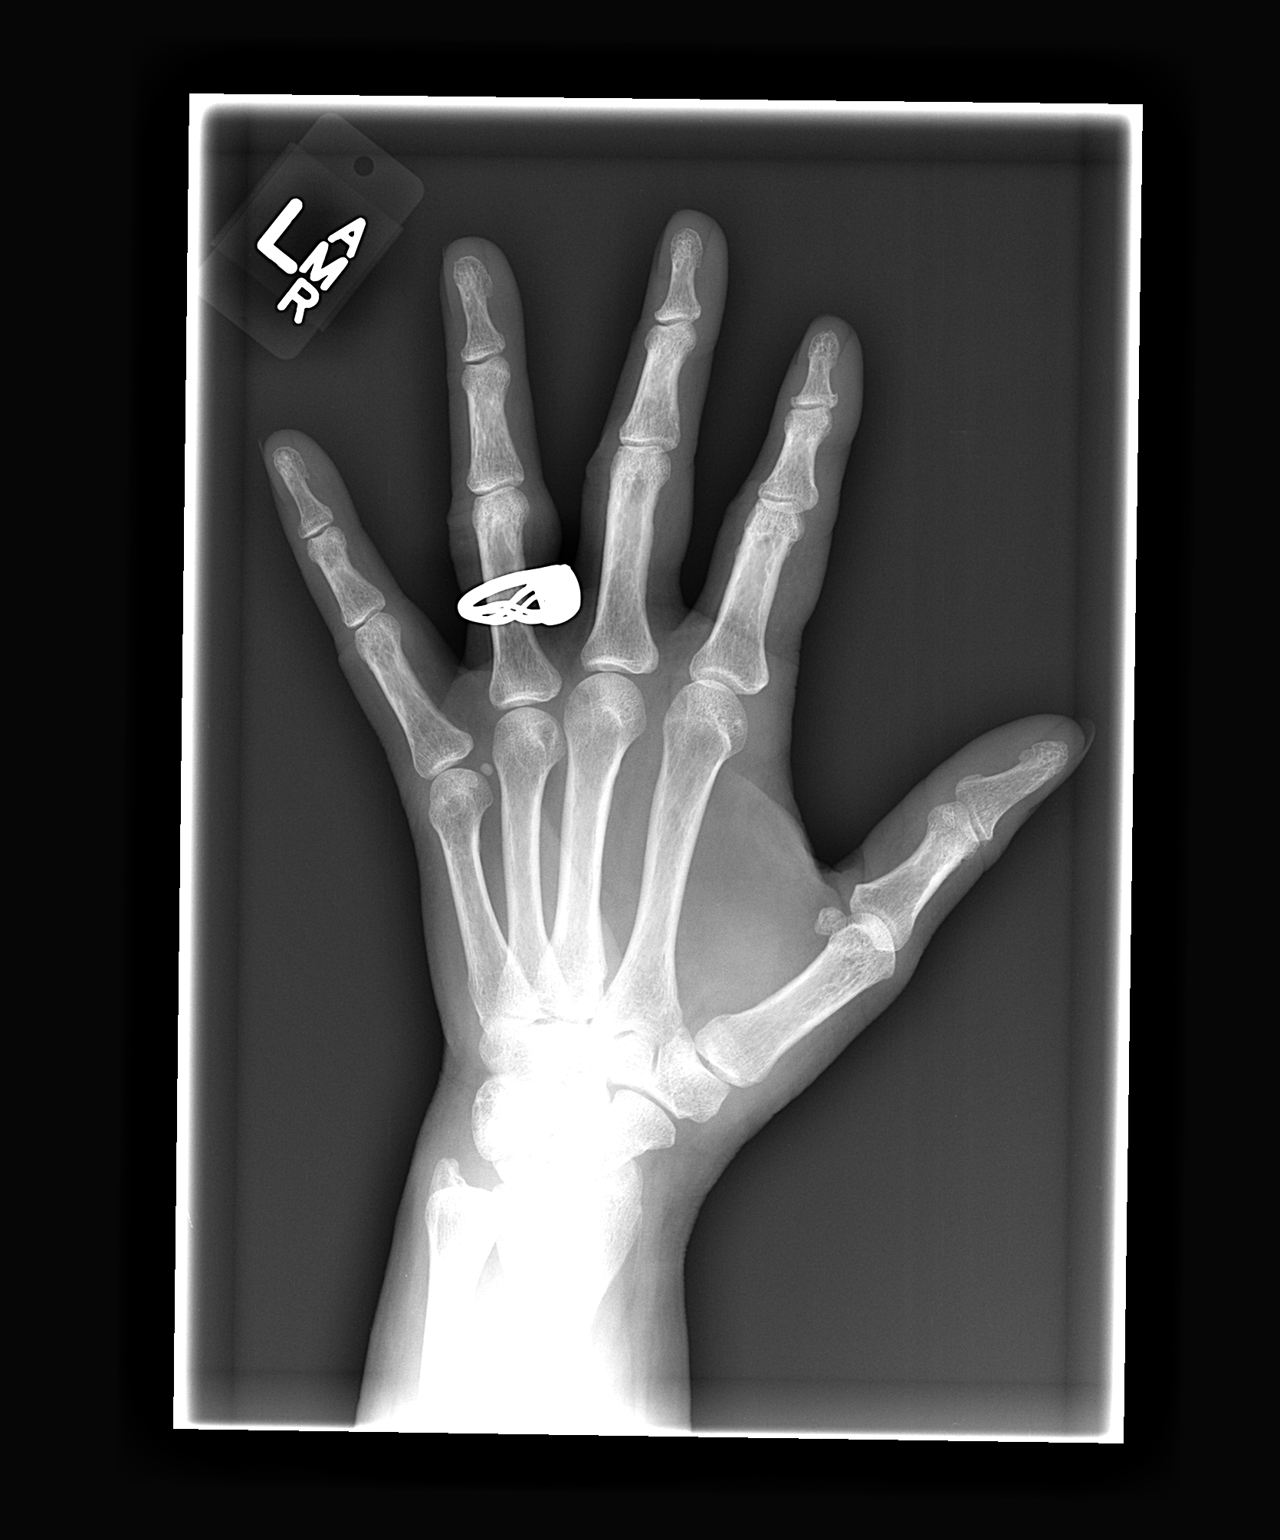

[view not recorded (3 of 3)]
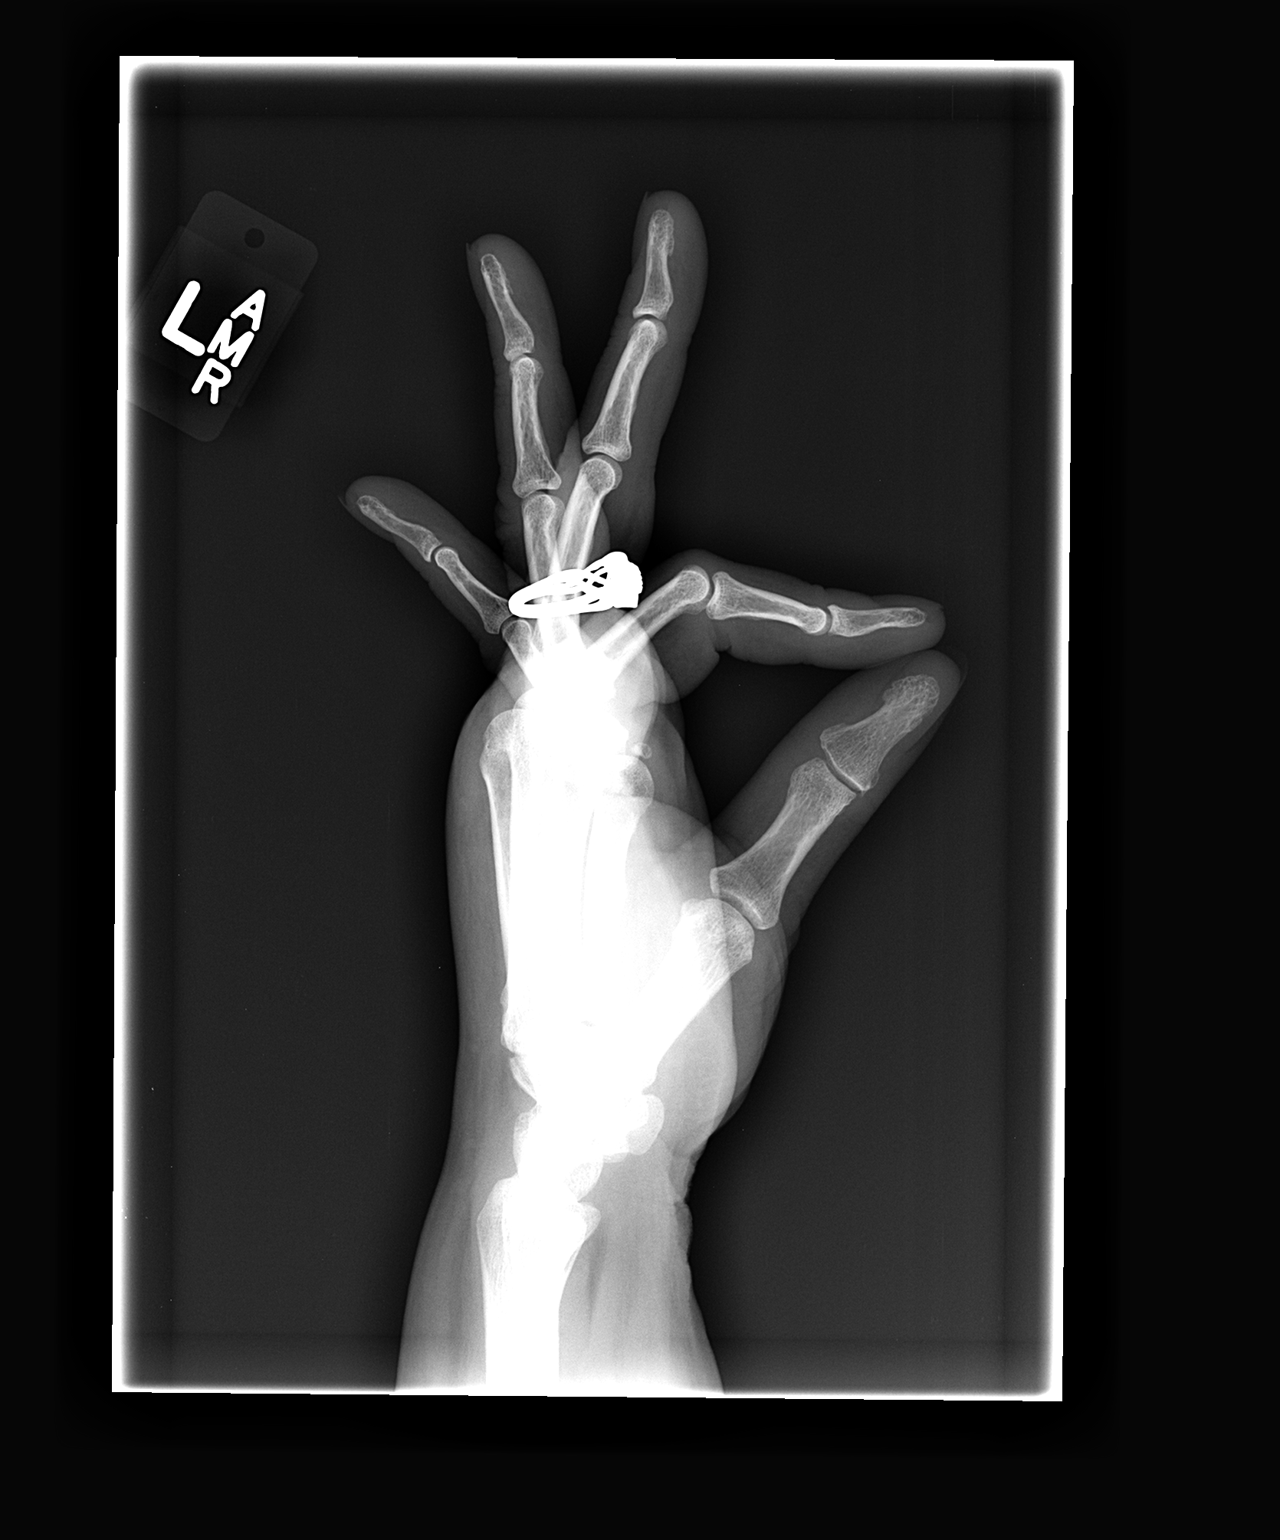

[3 of 3 positions shown; findings below may reference images not displayed]

FINDINGS: No acute bony abnormality.

No significant soft tissue swelling.

No radiopaque foreign body.

No joint effusion.

No unexpected radiopaque foreign body.

Specifically the metacarpophalangeal joint of the left index finger
is unremarkable an appears congruent.
IMPRESSION: No acute finding.

## 2014-11-07 NOTE — Telephone Encounter (Signed)
Xray ordered and left pt a message

## 2014-12-12 ENCOUNTER — Other Ambulatory Visit: Payer: Self-pay

## 2014-12-12 MED ORDER — PANTOPRAZOLE SODIUM 40 MG PO TBEC: DELAYED_RELEASE_TABLET | ORAL | Status: DC

## 2014-12-24 ENCOUNTER — Ambulatory Visit (INDEPENDENT_AMBULATORY_CARE_PROVIDER_SITE_OTHER): Payer: 59 | Admitting: Family Medicine

## 2014-12-24 ENCOUNTER — Other Ambulatory Visit: Payer: Self-pay

## 2014-12-24 ENCOUNTER — Encounter: Payer: Self-pay | Admitting: Family Medicine

## 2014-12-24 VITALS — BP 122/82 | HR 92 | Temp 99.1°F | Resp 16 | Ht 64.0 in | Wt 190.0 lb

## 2014-12-24 DIAGNOSIS — IMO0001 Reserved for inherently not codable concepts without codable children: Secondary | ICD-10-CM

## 2014-12-24 DIAGNOSIS — I10 Essential (primary) hypertension: Secondary | ICD-10-CM

## 2014-12-24 DIAGNOSIS — Z1159 Encounter for screening for other viral diseases: Secondary | ICD-10-CM

## 2014-12-24 DIAGNOSIS — F411 Generalized anxiety disorder: Secondary | ICD-10-CM

## 2014-12-24 DIAGNOSIS — D649 Anemia, unspecified: Secondary | ICD-10-CM

## 2014-12-24 DIAGNOSIS — J209 Acute bronchitis, unspecified: Secondary | ICD-10-CM

## 2014-12-24 DIAGNOSIS — J018 Other acute sinusitis: Secondary | ICD-10-CM

## 2014-12-24 DIAGNOSIS — E785 Hyperlipidemia, unspecified: Secondary | ICD-10-CM

## 2014-12-24 DIAGNOSIS — J019 Acute sinusitis, unspecified: Secondary | ICD-10-CM | POA: Insufficient documentation

## 2014-12-24 DIAGNOSIS — J309 Allergic rhinitis, unspecified: Secondary | ICD-10-CM

## 2014-12-24 LAB — COMPLETE METABOLIC PANEL WITH GFR
ALK PHOS: 68 U/L (ref 39–117)
ALT: 19 U/L (ref 0–35)
AST: 22 U/L (ref 0–37)
Albumin: 4 g/dL (ref 3.5–5.2)
BILIRUBIN TOTAL: 0.4 mg/dL (ref 0.2–1.2)
BUN: 8 mg/dL (ref 6–23)
CALCIUM: 9 mg/dL (ref 8.4–10.5)
CHLORIDE: 98 meq/L (ref 96–112)
CO2: 25 mEq/L (ref 19–32)
CREATININE: 0.93 mg/dL (ref 0.50–1.10)
GFR, Est African American: 75 mL/min
GFR, Est Non African American: 65 mL/min
Glucose, Bld: 108 mg/dL — ABNORMAL HIGH (ref 70–99)
Potassium: 3.4 mEq/L — ABNORMAL LOW (ref 3.5–5.3)
SODIUM: 136 meq/L (ref 135–145)
Total Protein: 7.4 g/dL (ref 6.0–8.3)

## 2014-12-24 LAB — LIPID PANEL
Cholesterol: 139 mg/dL (ref 0–200)
HDL: 32 mg/dL — AB (ref >=46)
LDL Cholesterol: 70 mg/dL (ref 0–99)
TRIGLYCERIDES: 186 mg/dL — AB (ref <150)
Total CHOL/HDL Ratio: 4.3 Ratio
VLDL: 37 mg/dL (ref 0–40)

## 2014-12-24 MED ORDER — PENICILLIN V POTASSIUM 500 MG PO TABS: 500.0000 mg | ORAL_TABLET | Freq: Three times a day (TID) | ORAL | Status: DC

## 2014-12-24 MED ORDER — METHYLPREDNISOLONE ACETATE 80 MG/ML IJ SUSP
80.0000 mg | Freq: Once | INTRAMUSCULAR | Status: AC
  Administered 2014-12-24: 80 mg via INTRAMUSCULAR

## 2014-12-24 MED ORDER — PROMETHAZINE-DM 6.25-15 MG/5ML PO SYRP: ORAL_SOLUTION | ORAL | Status: DC

## 2014-12-24 MED ORDER — CEFTRIAXONE SODIUM 500 MG IJ SOLR
500.0000 mg | Freq: Once | INTRAMUSCULAR | Status: AC
  Administered 2014-12-24: 500 mg via INTRAMUSCULAR

## 2014-12-24 MED ORDER — MOMETASONE FUROATE 50 MCG/ACT NA SUSP: 2.0000 | Freq: Every day | NASAL | Status: DC

## 2014-12-24 MED ORDER — FLUTICASONE PROPIONATE 50 MCG/ACT NA SUSP: 2.0000 | Freq: Every day | NASAL | Status: DC

## 2014-12-24 MED ORDER — BENZONATATE 100 MG PO CAPS: 100.0000 mg | ORAL_CAPSULE | Freq: Two times a day (BID) | ORAL | Status: DC | PRN

## 2014-12-24 NOTE — Progress Notes (Signed)
   Subjective:    Patient ID: [name], female    DOB: 1951-09-16, 64 y.o.   MRN: [id number]  HPI 1 week h/o worsening head and chest congestion, associated with fever and chills intermittently. Nasal drainage has thickened , and is yellowish green, and at times bloody. Sputum is thick and yellow. C/o bilateral ear pressure, denies hearing loss and sore throat. Increasing fatigue , poor appetitie and sleep disturbed by cough. No improvement with OTC medication.        Review of Systems See HPI . Denies chest pains, palpitations and leg swelling Denies abdominal pain, nausea, vomiting,diarrhea or constipation.   Denies dysuria, frequency, hesitancy or incontinence. Denies joint pain, swelling and limitation in mobility. Denies headaches, seizures, numbness, or tingling. Denies depression, anxiety or insomnia. Denies skin break down or rash.        Objective:   Physical Exam BP 122/82 mmHg  Pulse 92  Temp(Src) 99.1 F (37.3 C) (Oral)  Resp 16  Ht 5\' 4"  (1.626 m)  Wt 190 lb (86.183 kg)  BMI 32.60 kg/m2  SpO2 96% Patient alert and oriented and in no cardiopulmonary distress.  HEENT: No facial asymmetry, EOMI,   oropharynx pink and moist.  Neck supple no JVD, no mass. Frontal and maxillary sinus tenderness, TM clear Chest: adequate, though reduced air entry, scattered crackles , no wheezes  CVS: S1, S2 no murmurs, no S3.Regular rate.  ABD: Soft non tender.   Ext: No edema  MS: Adequate ROM spine, shoulders, hips and knees.  Skin: Intact, no ulcerations or rash noted.  Psych: Good eye contact, normal affect. Memory intact not anxious or depressed appearing.  CNS: CN 2-12 intact, power,  normal throughout.no focal deficits noted.        Assessment & Plan:  Allergic rhinitis Uncontrolled add nasonex   Acute sinusitis Antibiotics and decongestants prescribed medication also administered at office visit.     Acute bronchitis Antibiotics and  decongestants prescribed medication also administered at office visit.     Obesity, Class II, BMI 35-39.9, with comorbidity .unchanged. Patient re-educated about  the importance of commitment to a  minimum of 150 minutes of exercise per week. The importance of healthy food choices with portion control discussed. Encouraged to start a food diary, count calories and to consider  joining a support group. Sample diet sheets offered. Goals set by the patient for the next several months.      Essential hypertension Controlled, no change in medication DASH diet and commitment to daily physical activity for a minimum of 30 minutes discussed and encouraged, as a part of hypertension management. The importance of attaining a healthy weight is also discussed.    Hyperlipidemia LDL goal <100 Hyperlipidemia:Low fat diet discussed and encouraged.  Updated lab needed at/ before next visit.

## 2014-12-24 NOTE — Assessment & Plan Note (Signed)
Uncontrolled add nasonex

## 2014-12-24 NOTE — Assessment & Plan Note (Signed)
Hyperlipidemia:Low fat diet discussed and encouraged.  Updated lab needed at/ before next visit.  

## 2014-12-24 NOTE — Assessment & Plan Note (Signed)
Controlled, no change in medication DASH diet and commitment to daily physical activity for a minimum of 30 minutes discussed and encouraged, as a part of hypertension management. The importance of attaining a healthy weight is also discussed.  

## 2014-12-24 NOTE — Assessment & Plan Note (Signed)
Antibiotics and decongestants prescribed medication also administered at office visit.    

## 2014-12-24 NOTE — Addendum Note (Signed)
Addended by: Anthoney HaradaSLADE, Vedh Ptacek B on: 12/24/2014 08:58 AM   Modules accepted: Orders

## 2014-12-24 NOTE — Assessment & Plan Note (Signed)
unchanged Patient re-educated about  the importance of commitment to a  minimum of 150 minutes of exercise per week. The importance of healthy food choices with portion control discussed. Encouraged to start a food diary, count calories and to consider  joining a support group. Sample diet sheets offered. Goals set by the patient for the next several months.    

## 2014-12-24 NOTE — Patient Instructions (Signed)
F/u as before, call if you need me sooner  You are treated for uncontrolled allergies and acute sinusitis and bronchitis  You received injectytions in the office and 4 medications are prescribed, take as directed  Labs today  Hope you feel better soon

## 2014-12-25 LAB — HIV ANTIBODY (ROUTINE TESTING W REFLEX): HIV 1&2 Ab, 4th Generation: NONREACTIVE

## 2014-12-26 ENCOUNTER — Other Ambulatory Visit: Payer: Self-pay | Admitting: Family Medicine

## 2014-12-29 MED ORDER — POTASSIUM CHLORIDE CRYS ER 10 MEQ PO TBCR: 10.0000 meq | EXTENDED_RELEASE_TABLET | Freq: Two times a day (BID) | ORAL | Status: DC

## 2014-12-29 NOTE — Addendum Note (Signed)
Addended by: Kandis FantasiaSLADE, COURTNEY B on: 12/29/2014 11:32 AM   Modules accepted: Orders, Medications

## 2015-01-26 ENCOUNTER — Telehealth: Payer: Self-pay | Admitting: Family Medicine

## 2015-01-26 ENCOUNTER — Telehealth: Payer: Self-pay

## 2015-01-26 ENCOUNTER — Other Ambulatory Visit: Payer: Self-pay

## 2015-01-26 DIAGNOSIS — R42 Dizziness and giddiness: Secondary | ICD-10-CM

## 2015-01-26 MED ORDER — MECLIZINE HCL 25 MG PO TABS: 25.0000 mg | ORAL_TABLET | Freq: Three times a day (TID) | ORAL | Status: DC | PRN

## 2015-01-26 MED ORDER — ONDANSETRON HCL 4 MG PO TABS: ORAL_TABLET | ORAL | Status: DC

## 2015-01-26 NOTE — Telephone Encounter (Signed)
Meds sent and patient aware  

## 2015-01-26 NOTE — Telephone Encounter (Signed)
I sent the zofran, pls refill the meclizine and let her know

## 2015-01-27 NOTE — Telephone Encounter (Signed)
Patient states that she was able to get Zofran and not Meclizine.  She now feels better and will call back if she needs anything further.

## 2015-02-25 ENCOUNTER — Encounter: Payer: Self-pay | Admitting: Gastroenterology

## 2015-03-17 ENCOUNTER — Encounter: Payer: Self-pay | Admitting: Gastroenterology

## 2015-03-17 ENCOUNTER — Telehealth: Payer: Self-pay | Admitting: Gastroenterology

## 2015-03-17 ENCOUNTER — Telehealth: Payer: Self-pay | Admitting: Family Medicine

## 2015-03-17 DIAGNOSIS — R1013 Epigastric pain: Secondary | ICD-10-CM

## 2015-03-17 NOTE — Telephone Encounter (Signed)
PATIENT HAD A PCP ON HER [facility] THAT SHE WAS UNAWARE OF, TOLD HER TO CONTACT [employer] AND TELL THEM SHE WANTED DR SIMPSON AS HER PCP.  THEN TO CONTACT THEIR OFFICE AND THEY WILL CALL AND GET HER A FU OV AND SEND US THE REFERRAL

## 2015-03-17 NOTE — Telephone Encounter (Signed)
Referral entered  

## 2015-03-18 ENCOUNTER — Ambulatory Visit: Payer: 59 | Admitting: Gastroenterology

## 2015-03-24 ENCOUNTER — Telehealth: Payer: Self-pay

## 2015-03-24 ENCOUNTER — Other Ambulatory Visit: Payer: Self-pay

## 2015-03-24 ENCOUNTER — Ambulatory Visit (INDEPENDENT_AMBULATORY_CARE_PROVIDER_SITE_OTHER): Payer: 59 | Admitting: Family Medicine

## 2015-03-24 ENCOUNTER — Encounter: Payer: Self-pay | Admitting: Family Medicine

## 2015-03-24 VITALS — BP 128/82 | HR 87 | Resp 16 | Ht 64.0 in | Wt 192.0 lb

## 2015-03-24 DIAGNOSIS — E890 Postprocedural hypothyroidism: Secondary | ICD-10-CM

## 2015-03-24 DIAGNOSIS — I10 Essential (primary) hypertension: Secondary | ICD-10-CM | POA: Diagnosis not present

## 2015-03-24 DIAGNOSIS — J3089 Other allergic rhinitis: Secondary | ICD-10-CM

## 2015-03-24 DIAGNOSIS — F411 Generalized anxiety disorder: Secondary | ICD-10-CM

## 2015-03-24 DIAGNOSIS — E559 Vitamin D deficiency, unspecified: Secondary | ICD-10-CM

## 2015-03-24 DIAGNOSIS — E785 Hyperlipidemia, unspecified: Secondary | ICD-10-CM

## 2015-03-24 DIAGNOSIS — K219 Gastro-esophageal reflux disease without esophagitis: Secondary | ICD-10-CM | POA: Diagnosis not present

## 2015-03-24 DIAGNOSIS — E8881 Metabolic syndrome: Secondary | ICD-10-CM

## 2015-03-24 DIAGNOSIS — R7302 Impaired glucose tolerance (oral): Secondary | ICD-10-CM | POA: Diagnosis not present

## 2015-03-24 DIAGNOSIS — R32 Unspecified urinary incontinence: Secondary | ICD-10-CM

## 2015-03-24 DIAGNOSIS — IMO0001 Reserved for inherently not codable concepts without codable children: Secondary | ICD-10-CM

## 2015-03-24 MED ORDER — RANITIDINE HCL 150 MG PO CAPS: 150.0000 mg | ORAL_CAPSULE | Freq: Two times a day (BID) | ORAL | Status: DC

## 2015-03-24 MED ORDER — RANITIDINE HCL 150 MG PO TABS: 150.0000 mg | ORAL_TABLET | Freq: Two times a day (BID) | ORAL | Status: DC

## 2015-03-24 MED ORDER — MIRABEGRON ER 25 MG PO TB24: 25.0000 mg | ORAL_TABLET | Freq: Every day | ORAL | Status: DC

## 2015-03-24 NOTE — Patient Instructions (Addendum)
Welcome to medicare in 5 months, call if you need me before  New med for reflux, continue what you already take, is ranidine twice daily.. New med to help with incontinence  CBC, fasting lipid, cmp, HBa1C, TSH, vit D in 6 months  It is important that you exercise regularly at least 30 minutes 5 times a week. If you develop chest pain, have severe difficulty breathing, or feel very tired, stop exercising immediately and seek medical attention   Please work on good  health habits so that your health will improve. 1. Commitment to daily physical activity for 30 to 60  minutes, if you are able to do this.  2. Commitment to wise food choices. Aim for half of your  food intake to be vegetable and fruit, one quarter starchy foods, and one quarter protein. Try to eat on a regular schedule  3 meals per day, snacking between meals should be limited to vegetables or fruits or small portions of nuts. 64 ounces of water per day is generally recommended, unless you have specific health conditions, like heart failure or kidney failure where you will need to limit fluid intake.  3. Commitment to sufficient and a  good quality of physical and mental rest daily, generally between 6 to 8 hours per day.  WITH PERSISTANCE AND PERSEVERANCE, THE IMPOSSIBLE , BECOMES THE NORM! Thanks [facility], we consider it a privelige to serve you.

## 2015-03-24 NOTE — Telephone Encounter (Signed)
Even after PA on Myrbetriq was approved the medication was still too much for her to afford. Are there any more options?

## 2015-03-24 NOTE — Assessment & Plan Note (Signed)
Controlled, no change in medication  

## 2015-03-24 NOTE — Telephone Encounter (Signed)
No she has been seeing urology in the past, and botox has been offered and is being considered by the patient, sorry!, and thanks for your help!

## 2015-03-24 NOTE — Assessment & Plan Note (Signed)
The increased risk of cardiovascular disease associated with this diagnosis, and the need to consistently work on lifestyle to change this is discussed. Following  a  heart healthy diet ,commitment to 30 minutes of exercise at least 5 days per week, as well as control of blood sugar and cholesterol , and achieving a healthy weight are all the areas to be addressed .  

## 2015-03-24 NOTE — Assessment & Plan Note (Signed)
Deteriorated. Patient re-educated about  the importance of commitment to a  minimum of 150 minutes of exercise per week.  The importance of healthy food choices with portion control discussed. Encouraged to start a food diary, count calories and to consider  joining a support group. Sample diet sheets offered. Goals set by the patient for the next several months.   Weight /BMI 03/24/2015 12/24/2014 10/08/2014  WEIGHT 192 lb 190 lb 193 lb  HEIGHT 5\' 4"  5\' 4"  5\' 4"   BMI 32.94 kg/m2 32.6 kg/m2 33.11 kg/m2    Current exercise per week 200 minutes.

## 2015-03-24 NOTE — Assessment & Plan Note (Addendum)
Uncontrolled, reports being unable to sing as she should , will add zantac twice daily fo 2 months, may need ENT eval Also reports recent onset epigastric burning , and abn BM, has called for GI appt

## 2015-03-24 NOTE — Assessment & Plan Note (Signed)
Hyperlipidemia:Low fat diet discussed and encouraged. Elevated TG Updated lab needed at/ before next visit.    Lipid Panel  Lab Results  Component Value Date   CHOL 139 12/24/2014   HDL 32* 12/24/2014   LDLCALC 70 12/24/2014   TRIG 186* 12/24/2014   CHOLHDL 4.3 12/24/2014

## 2015-03-24 NOTE — Progress Notes (Signed)
Laura [name]     MRN: [id number]      DOB: 1950-11-13   HPI [name] is here for follow up and re-evaluation of chronic medical conditions, medication management and review of any available recent lab and radiology data.  Preventive health is updated, specifically  Cancer screening and Immunization.   Questions or concerns regarding consultations or procedures which the PT has had in the interim are  addressed. The PT denies any adverse reactions to current medications since the last visit.  There are no new concerns.  C/o uncontrolled incontinence, also increased abdominal pain and hoarseness ROS Denies recent fever or chills. Denies sinus pressure, nasal congestion, ear pain or sore throat. Denies chest congestion, productive cough or wheezing. Denies chest pains, palpitations and leg swelling Denies abdominal pain, nausea, vomiting,diarrhea or constipation.   Denies dysuria, frequency, hesitancy Denies joint pain, swelling and limitation in mobility. Denies headaches, seizures, numbness, or tingling. Denies depression, anxiety or insomnia. Denies skin break down or rash.   PE  BP 128/82 mmHg  Pulse 87  Resp 16  Ht  (1.626 m)  Wt 192 lb (87.091 kg)  BMI 32.94 kg/m2  SpO2 98%  Patient alert and oriented and in no cardiopulmonary distress.  HEENT: No facial asymmetry, EOMI,   oropharynx pink and moist.  Neck supple no JVD, no mass.  Chest: Clear to auscultation bilaterally.  CVS: S1, S2 no murmurs, no S3.Regular rate.  ABD: Soft non tender.   Ext: No edema  MS: Adequate ROM spine, shoulders, hips and knees.  Skin: Intact, no ulcerations or rash noted.  Psych: Good eye contact, normal affect. Memory intact not anxious or depressed appearing.  CNS: CN 2-12 intact, power,  normal throughout.no focal deficits noted.   Assessment & Plan   Essential hypertension Controlled, no change in medication DASH diet and commitment to daily physical activity for a  minimum of 30 minutes discussed and encouraged, as a part of hypertension management. The importance of attaining a healthy weight is also discussed.  BP/Weight 03/24/2015 12/24/2014 10/08/2014 10/01/2014 09/23/2014 07/28/2014 06/05/2014  Systolic BP 138 122 140 135 130 136 106  Diastolic BP 86 82 84 81 82 78 70  Wt. (Lbs) 192 190 193 192 192 195.04 190.04  BMI 32.94 32.6 33.11 32.94 36.3 36.87 35.93         Allergic rhinitis No recurrent flares, uses tylenol sinus when needed   GERD Uncontrolled, reports being unable to sing as she should , will add zantac twice daily fo 2 months, may need ENT eval Also reports recent onset epigastric burning , and abn BM, has called for GI appt   Urinary incontinence Uncontrolled, trial of new medication, has failed many in the past   POSTSURGICAL HYPOTHYROIDISM Followed by endo will order [facility] for Sept follow up   Hyperlipidemia LDL goal <100 Hyperlipidemia:Low fat diet discussed and encouraged. Elevated TG Updated lab needed at/ before next visit.    Lipid Panel  Lab Results  Component Value Date   CHOL 139 12/24/2014   HDL 32* 12/24/2014   LDLCALC 70 12/24/2014   TRIG 186* 12/24/2014   CHOLHDL 4.3 12/24/2014         Obesity, Class II, BMI 35-39.9, with comorbidity Deteriorated. Patient re-educated about  the importance of commitment to a  minimum of 150 minutes of exercise per week.  The importance of healthy food choices with portion control discussed. Encouraged to start a food diary, count calories and to consider  joining a support group. Sample diet sheets offered. Goals set by the patient for the next several months.   Weight /BMI 03/24/2015 12/24/2014 10/08/2014  WEIGHT 192 lb 190 lb 193 lb  HEIGHT 5\' 4"  5\' 4"  5\' 4"   BMI 32.94 kg/m2 32.6 kg/m2 33.11 kg/m2    Current exercise per week 200 minutes.    Metabolic syndrome X The increased risk of cardiovascular disease associated with this diagnosis, and the need  to consistently work on lifestyle to change this is discussed. Following  a  heart healthy diet ,commitment to 30 minutes of exercise at least 5 days per week, as well as control of blood sugar and cholesterol , and achieving a healthy weight are all the areas to be addressed .    GAD (generalized anxiety disorder) Controlled, no change in medication

## 2015-03-24 NOTE — Assessment & Plan Note (Signed)
Follow[facility] by [facility]o [facility]

## 2015-03-24 NOTE — Assessment & Plan Note (Signed)
Uncontrolled, trial of new medication, has failed many in the past

## 2015-03-24 NOTE — Assessment & Plan Note (Signed)
Controlled, no change in medication DASH diet and commitment to daily physical activity for a minimum of 30 minutes discussed and encouraged, as a part of hypertension management. The importance of attaining a healthy weight is also discussed.  BP/Weight 03/24/2015 12/24/2014 10/08/2014 10/01/2014 09/23/2014 07/28/2014 06/05/2014  Systolic BP 138 122 140 135 130 136 106  Diastolic BP 86 82 84 81 82 78 70  Wt. (Lbs) 192 190 193 192 192 195.04 190.04  BMI 32.94 32.6 33.11 32.94 36.3 36.87 35.93

## 2015-03-24 NOTE — Assessment & Plan Note (Signed)
No recurrent flares, uses tylenol sinus when needed

## 2015-03-25 NOTE — Telephone Encounter (Signed)
Patient aware.

## 2015-04-06 ENCOUNTER — Other Ambulatory Visit: Payer: Self-pay | Admitting: Family Medicine

## 2015-04-20 ENCOUNTER — Other Ambulatory Visit: Payer: Self-pay | Admitting: Family Medicine

## 2015-04-22 ENCOUNTER — Encounter: Payer: Self-pay | Admitting: Gastroenterology

## 2015-04-22 ENCOUNTER — Ambulatory Visit (INDEPENDENT_AMBULATORY_CARE_PROVIDER_SITE_OTHER): Payer: 59 | Admitting: Gastroenterology

## 2015-04-22 VITALS — BP 123/73 | HR 79 | Temp 97.9°F | Ht 64.0 in | Wt 184.6 lb

## 2015-04-22 DIAGNOSIS — K5901 Slow transit constipation: Secondary | ICD-10-CM | POA: Diagnosis not present

## 2015-04-22 DIAGNOSIS — K219 Gastro-esophageal reflux disease without esophagitis: Secondary | ICD-10-CM | POA: Diagnosis not present

## 2015-04-22 MED ORDER — LINACLOTIDE 145 MCG PO CAPS: ORAL_CAPSULE | ORAL | Status: DC

## 2015-04-22 NOTE — Progress Notes (Signed)
cc'd to pcp 

## 2015-04-22 NOTE — Progress Notes (Signed)
Subjective:    Patient ID: [name], female    DOB: Sep 17, 1951, 64 y.o.   MRN: [id number]  Syliva Overman, MD  HPI HAD CONVENTION LAST WEEKEND. FORGOT LINZESS AND HAD HARD STOOL WITH RECTAL BLEEDING BUT IT'S GONE NOW. LINZESS WORKING WELL. MAY HEARTBURN: 1X/MO-NOT REALLY BAD. DIDN'T HAVE ABD PAIN WITH RECTAL BLEEDING. NO PAIN IN BUTT. NOW HAS MILD CRAMPING BECAUSE SHE NEEDS TO TAKE HER LINZESS.  PT DENIES FEVER, CHILLS, nausea, vomiting, melena, diarrhea, CHEST PAIN, SHORTNESS OF BREATH, problems swallowing, OR heartburn or indigestion.   Past Medical History  Diagnosis Date  . GERD (gastroesophageal reflux disease)   . Gastritis   . Hypertension   . Hyperlipidemia   . Anemia   . Cystitis   . Osteopenia   . Shoulder pain   . Hypothyroidism   . Hashimoto's thyroiditis     Hx   . Urinary incontinence   . Anxiety   . Depression     Past Surgical History  Procedure Laterality Date  . Total abdominal hysterectomy  1994  . Umbilical hernia repair    . Breast excisional biopsy  2010    Excisional biopsy of benign left breast mass -lopoma   . Resection of left lobe of thyroid    . Bilateral tubal ligation    . Right carpal tunnel release    . Urethral dilation for stenosis  2009  . Rt. knee athroscopy  2004  . Colonscopy  2005    Dr. Katrinka Blazing  . Esophagogastroduodenoscopy  12/2009    chronic gastritis  . Breast surgery      left nreast biopsy  . Colonoscopy  08/26/2011    Procedure: COLONOSCOPY;  Surgeon: Arlyce Harman, MD;  Location: [facility];  Service: Endoscopy;  Laterality: N/A;  8:30   Allergies  Allergen Reactions  . Dilaudid [Hydromorphone Hcl]   . Levaquin [Levofloxacin] Other (See Comments)    Dry throat, no raste  . Nsaids     REACTION: per GI pt no longer to use NSAIDS    Current Outpatient Prescriptions  Medication Sig Dispense Refill  . BIOTIN PO Take by mouth.    . citalopram (CELEXA) 10 MG tablet Take 1 tablet (10 mg total) by mouth  daily.    . hydrochlorothiazide (HYDRODIURIL) 25 MG tablet TAKE 1 TABLET BY MOUTH EVERY DAY    . imipramine (TOFRANIL) 25 MG tablet Take 1 tablet (25 mg total) by mouth at bedtime.    Marland Kitchen imipramine (TOFRANIL) 25 MG tablet     . levothyroxine (SYNTHROID, LEVOTHROID) 112 MCG tablet TAKE 1 TABLET BY MOUTH EVERY MORNING    . Linaclotide (LINZESS) 145 MCG CAPS capsule 1 PO 30 mins prior to your first meal    . losartan (COZAAR) 25 MG tablet Take 1 tablet (25 mg total) by mouth daily.    Marland Kitchen lovastatin (MEVACOR) 40 MG tablet TAKE 2 TABLETS BY MOUTH EVERY NIGHT AT BEDTIME FOR CHOLESTEROL    . Multiple Vitamins-Minerals (CENTRUM ADULTS PO) Take 1 tablet by mouth daily.    . pantoprazole (PROTONIX) 40 MG tablet TAKE 1 TABLET BY MOUTH TWICE DAILY    . potassium chloride (K-DUR,KLOR-CON) 10 MEQ tablet Take 1 tablet (10 mEq total) by mouth 2 (two) times daily.    . ranitidine (ZANTAC) 150 MG tablet Take 1 tablet (150 mg total) by mouth 2 (two) times daily. SINCE JUN 2016   .      .      .      Marland Kitchen  Review of Systems PER HPI OTHERWISE ALL SYSTEMS ARE NEGATIVE.     Objective:   Physical Exam  Constitutional: She is oriented to person, place, and time. She appears well-developed and well-nourished. No distress.  HENT:  Head: Normocephalic and atraumatic.  Mouth/Throat: Oropharynx is clear and moist. No oropharyngeal exudate.  Eyes: Pupils are equal, round, and reactive to light. No scleral icterus.  Neck: Normal range of motion. Neck supple.  Cardiovascular: Normal rate, regular rhythm and normal heart sounds.   Pulmonary/Chest: Effort normal and breath sounds normal. No respiratory distress.  Abdominal: Soft. Bowel sounds are normal. She exhibits no distension. There is no tenderness.  Musculoskeletal: She exhibits no edema.  Lymphadenopathy:    She has no cervical adenopathy.  Neurological: She is alert and oriented to person, place, and time.  Psychiatric: She has a normal mood and affect.    Vitals reviewed.         Assessment & Plan:

## 2015-04-22 NOTE — Assessment & Plan Note (Signed)
IDIOPATHIC. SYMPTOMS FAIRLY WELL CONTROLLED.  DRINK WATER EAT FIBER CONTINUE LINZESS. FOLLOW UP IN 6 MOS.

## 2015-04-22 NOTE — Assessment & Plan Note (Signed)
SYMPTOMS FAIRLY WELL CONTROLLED.  ZANTAC HELPS MOST WHEN USED AS NEEDED. CONTINUE PROTONIX. TAKE 30 MINUTES PRIOR TO MEALS TWICE DAILY. LOW FAT DIET FOLLOW UP IN 6 MOS.

## 2015-04-22 NOTE — Patient Instructions (Addendum)
CONGRATULATIONS ON THE WEIGHT LOSS. KEEP UP THE GOOD WORK. YOU LOST 8 LBS SINCE DEC 2015.  TRY TO LOSE 10 MORE POUNDS.  DRINK WATER TO KEEP YOUR URINE LIGHT YELLOW.  AVOID FOOD THAT TRIGGERS REFLUX. SEE INFO BELOW.   FOLLOW A HIGH FIBER/LOW FAT DIET. MEATS SHOULD BE BAKED, BROILED, OR BOILED. AVOID FRIED FOODS.   CONTINUE LINZESS.  FOLLOW UP IN 6 MOS.     Lifestyle and home remedies TO MANAGE REFLUX  You may eliminate or reduce the frequency of heartburn by making the following lifestyle changes:  . Control your weight. Being overweight is a major risk factor for heartburn and GERD. Excess pounds put pressure on your abdomen, pushing up your stomach and causing acid to back up into your esophagus.   . Eat smaller meals. 4 TO 6 MEALS A DAY. This reduces pressure on the lower esophageal sphincter, helping to prevent the valve from opening and acid from washing back into your esophagus.   [place]ena Earing your belt. Clothes that fit tightly around your waist put pressure on your abdomen and the lower esophageal sphincter.   . Eliminate heartburn triggers. Everyone has specific triggers. Common triggers such as fatty or fried foods, spicy food, tomato sauce, carbonated beverages, alcohol, chocolate, mint, garlic, onion, caffeine and nicotine may make heartburn worse.   Marland Kitchen Avoid stooping or bending. Tying your shoes is OK. Bending over for longer periods to weed your garden isn't, especially soon after eating.   . Don't lie down after a meal. Wait at least three to four hours after eating before going to bed, and don't lie down right after eating.    Alternative medicine . Several home remedies exist for treating GERD, but they provide only temporary relief. They include drinking baking soda (sodium bicarbonate) added to water or drinking other fluids such as baking soda mixed with cream of tartar and water.  . Although these liquids create temporary relief by neutralizing, washing away or  buffering acids, eventually they aggravate the situation by adding gas and fluid to your stomach, increasing pressure and causing more acid reflux. Further, adding more sodium to your diet may increase your blood pressure and add stress to your heart, and excessive bicarbonate ingestion can alter the acid-base balance in your body.   Low-Fat Diet BREADS, CEREALS, PASTA, RICE, DRIED PEAS, AND BEANS These products are high in carbohydrates and most are low in fat. Therefore, they can be increased in the diet as substitutes for fatty foods. They too, however, contain calories and should not be eaten in excess. Cereals can be eaten for snacks as well as for breakfast.   FRUITS AND VEGETABLES It is good to eat fruits and vegetables. Besides being sources of fiber, both are rich in vitamins and some minerals. They help you get the daily allowances of these nutrients. Fruits and vegetables can be used for snacks and desserts.  MEATS Limit lean meat, chicken, [place], and fish to no more than 6 ounces per day. Beef, Pork, and Lamb Use lean cuts of beef, pork, and lamb. Lean cuts include:  Extra-lean ground beef.  Arm roast.  Sirloin tip.  Center-cut ham.  Round steak.  Loin chops.  Rump roast.  Tenderloin.  Trim all fat off the outside of meats before cooking. It is not necessary to severely decrease the intake of red meat, but lean choices should be made. Lean meat is rich in protein and contains a highly absorbable form of iron. Premenopausal women, in  particular, should avoid reducing lean red meat because this could increase the risk for low red blood cells (iron-deficiency anemia).  Chicken and [place] These are good sources of protein. The fat of poultry can be reduced by removing the skin and underlying fat layers before cooking. Chicken and [place] can be substituted for lean red meat in the diet. Poultry should not be fried or covered with high-fat sauces. Fish and Shellfish Fish is a good  source of protein. Shellfish contain cholesterol, but they usually are low in saturated fatty acids. The preparation of fish is important. Like chicken and [place], they should not be fried or covered with high-fat sauces. EGGS Egg whites contain no fat or cholesterol. They can be eaten often. Try 1 to 2 egg whites instead of whole eggs in recipes or use egg substitutes that do not contain yolk. MILK AND DAIRY PRODUCTS Use skim or 1% milk instead of 2% or whole milk. Decrease whole milk, natural, and processed cheeses. Use nonfat or low-fat (2%) cottage cheese or low-fat cheeses made from vegetable oils. Choose nonfat or low-fat (1 to 2%) yogurt. Experiment with evaporated skim milk in recipes that call for heavy cream. Substitute low-fat yogurt or low-fat cottage cheese for sour cream in dips and salad dressings. Have at least 2 servings of low-fat dairy products, such as 2 glasses of skim (or 1%) milk each day to help get your daily calcium intake. FATS AND OILS Reduce the total intake of fats, especially saturated fat. Butterfat, lard, and beef fats are high in saturated fat and cholesterol. These should be avoided as much as possible. Vegetable fats do not contain cholesterol, but certain vegetable fats, such as coconut oil, palm oil, and palm kernel oil are very high in saturated fats. These should be limited. These fats are often used in bakery goods, processed foods, popcorn, oils, and nondairy creamers. Vegetable shortenings and some peanut butters contain hydrogenated oils, which are also saturated fats. Read the labels on these foods and check for saturated vegetable oils. Unsaturated vegetable oils and fats do not raise blood cholesterol. However, they should be limited because they are fats and are high in calories. Total fat should still be limited to 30% of your daily caloric intake. Desirable liquid vegetable oils are corn oil, cottonseed oil, olive oil, canola oil, safflower oil, soybean oil,  and sunflower oil. Peanut oil is not as good, but small amounts are acceptable. Buy a heart-healthy tub margarine that has no partially hydrogenated oils in the ingredients. Mayonnaise and salad dressings often are made from unsaturated fats, but they should also be limited because of their high calorie and fat content. Seeds, nuts, peanut butter, olives, and avocados are high in fat, but the fat is mainly the unsaturated type. These foods should be limited mainly to avoid excess calories and fat. OTHER EATING TIPS Snacks  Most sweets should be limited as snacks. They tend to be rich in calories and fats, and their caloric content outweighs their nutritional value. Some good choices in snacks are graham crackers, melba toast, soda crackers, bagels (no egg), English muffins, fruits, and vegetables. These snacks are preferable to snack crackers, [place] fries, TORTILLA CHIPS, and POTATO chips. Popcorn should be air-popped or cooked in small amounts of liquid vegetable oil. Desserts Eat fruit, low-fat yogurt, and fruit ices instead of pastries, cake, and cookies. Sherbet, angel food cake, gelatin dessert, frozen low-fat yogurt, or other frozen products that do not contain saturated fat (pure fruit juice bars, frozen  ice pops) are also acceptable.  COOKING METHODS Choose those methods that use little or no fat. They include: Poaching.  Braising.  Steaming.  Grilling.  Baking.  Stir-frying.  Broiling.  Microwaving.  Foods can be cooked in a nonstick pan without added fat, or use a nonfat cooking spray in regular cookware. Limit fried foods and avoid frying in saturated fat. Add moisture to lean meats by using water, broth, cooking wines, and other nonfat or low-fat sauces along with the cooking methods mentioned above. Soups and stews should be chilled after cooking. The fat that forms on top after a few hours in the refrigerator should be skimmed off. When preparing meals, avoid using excess salt. Salt  can contribute to raising blood pressure in some people.  EATING AWAY FROM HOME Order entres, potatoes, and vegetables without sauces or butter. When meat exceeds the size of a deck of cards (3 to 4 ounces), the rest can be taken home for another meal. Choose vegetable or fruit salads and ask for low-calorie salad dressings to be served on the side. Use dressings sparingly. Limit high-fat toppings, such as bacon, crumbled eggs, cheese, sunflower seeds, and olives. Ask for heart-healthy tub margarine instead of butter.

## 2015-05-06 ENCOUNTER — Other Ambulatory Visit: Payer: Self-pay

## 2015-05-06 MED ORDER — PANTOPRAZOLE SODIUM 40 MG PO TBEC: DELAYED_RELEASE_TABLET | ORAL | Status: DC

## 2015-07-29 ENCOUNTER — Other Ambulatory Visit: Payer: Self-pay | Admitting: Family Medicine

## 2015-07-31 ENCOUNTER — Other Ambulatory Visit: Payer: Self-pay | Admitting: Family Medicine

## 2015-07-31 DIAGNOSIS — Z1231 Encounter for screening mammogram for malignant neoplasm of breast: Secondary | ICD-10-CM

## 2015-08-03 ENCOUNTER — Other Ambulatory Visit: Payer: Self-pay | Admitting: Family Medicine

## 2015-08-11 DIAGNOSIS — E559 Vitamin D deficiency, unspecified: Secondary | ICD-10-CM | POA: Diagnosis not present

## 2015-08-11 DIAGNOSIS — I10 Essential (primary) hypertension: Secondary | ICD-10-CM | POA: Diagnosis not present

## 2015-08-11 DIAGNOSIS — R7302 Impaired glucose tolerance (oral): Secondary | ICD-10-CM | POA: Diagnosis not present

## 2015-08-11 DIAGNOSIS — E8881 Metabolic syndrome: Secondary | ICD-10-CM | POA: Diagnosis not present

## 2015-08-12 LAB — CBC WITH DIFFERENTIAL/PLATELET
BASOS PCT: 0 % (ref 0–1)
Basophils Absolute: 0 10*3/uL (ref 0.0–0.1)
EOS ABS: 0.2 10*3/uL (ref 0.0–0.7)
EOS PCT: 3 % (ref 0–5)
HCT: 33.2 % — ABNORMAL LOW (ref 36.0–46.0)
Hemoglobin: 10.7 g/dL — ABNORMAL LOW (ref 12.0–15.0)
LYMPHS ABS: 2 10*3/uL (ref 0.7–4.0)
Lymphocytes Relative: 38 % (ref 12–46)
MCH: 26.8 pg (ref 26.0–34.0)
MCHC: 32.2 g/dL (ref 30.0–36.0)
MCV: 83 fL (ref 78.0–100.0)
MONOS PCT: 9 % (ref 3–12)
MPV: 9.4 fL (ref 8.6–12.4)
Monocytes Absolute: 0.5 10*3/uL (ref 0.1–1.0)
NEUTROS PCT: 50 % (ref 43–77)
Neutro Abs: 2.7 10*3/uL (ref 1.7–7.7)
PLATELETS: 312 10*3/uL (ref 150–400)
RBC: 4 MIL/uL (ref 3.87–5.11)
RDW: 12.7 % (ref 11.5–15.5)
WBC: 5.3 10*3/uL (ref 4.0–10.5)

## 2015-08-12 LAB — COMPREHENSIVE METABOLIC PANEL
ALK PHOS: 74 U/L (ref 33–130)
ALT: 19 U/L (ref 6–29)
AST: 23 U/L (ref 10–35)
Albumin: 4 g/dL (ref 3.6–5.1)
BUN: 13 mg/dL (ref 7–25)
CO2: 29 mmol/L (ref 20–31)
Calcium: 9.5 mg/dL (ref 8.6–10.4)
Chloride: 101 mmol/L (ref 98–110)
Creat: 0.97 mg/dL (ref 0.50–0.99)
Glucose, Bld: 84 mg/dL (ref 65–99)
POTASSIUM: 4.3 mmol/L (ref 3.5–5.3)
Sodium: 138 mmol/L (ref 135–146)
Total Bilirubin: 0.6 mg/dL (ref 0.2–1.2)
Total Protein: 7.3 g/dL (ref 6.1–8.1)

## 2015-08-12 LAB — LIPID PANEL
CHOLESTEROL: 175 mg/dL (ref 125–200)
HDL: 45 mg/dL — ABNORMAL LOW (ref >=46)
LDL Cholesterol: 104 mg/dL (ref <130)
Total CHOL/HDL Ratio: 3.9 Ratio (ref <=5.0)
Triglycerides: 130 mg/dL (ref <150)
VLDL: 26 mg/dL (ref <30)

## 2015-08-12 LAB — HEMOGLOBIN A1C
Hgb A1c MFr Bld: 5.6 % (ref <5.7)
Mean Plasma Glucose: 114 mg/dL (ref <117)

## 2015-08-12 LAB — TSH: TSH: 0.188 u[IU]/mL — ABNORMAL LOW (ref 0.350–4.500)

## 2015-08-12 LAB — VITAMIN D 25 HYDROXY (VIT D DEFICIENCY, FRACTURES): VIT D 25 HYDROXY: 30 ng/mL (ref 30–100)

## 2015-08-27 ENCOUNTER — Ambulatory Visit (INDEPENDENT_AMBULATORY_CARE_PROVIDER_SITE_OTHER): Payer: Medicare Other | Admitting: "Endocrinology

## 2015-08-27 ENCOUNTER — Encounter: Payer: Self-pay | Admitting: "Endocrinology

## 2015-08-27 VITALS — BP 135/84 | HR 82 | Ht 64.0 in | Wt 185.0 lb

## 2015-08-27 DIAGNOSIS — E890 Postprocedural hypothyroidism: Secondary | ICD-10-CM | POA: Diagnosis not present

## 2015-08-27 MED ORDER — LEVOTHYROXINE SODIUM 100 MCG PO TABS: 100.0000 ug | ORAL_TABLET | Freq: Every day | ORAL | Status: DC

## 2015-08-27 NOTE — Progress Notes (Signed)
HPI  [name] is a 64 y.o.-year-old female,  She has  medical h/o Goiter s/p Lt sided hemithyroidectomy 20+ years ago,  unremarkable thyroid u/s in 2012.  She was seen by me 2 years ago, she no showed since then. She remained on levothyroxine 112 g.  She is compliant taking her medication properly.  Her most recent thyroid function tests are consistent with over replacement.  I reviewed pt's thyroid tests: Lab Results  Component Value Date   TSH 0.188* 08/11/2015   TSH 0.047* 12/20/2010   TSH 0.032* 11/17/2010   TSH 1.544 08/14/2010   TSH 5.135* 05/24/2010   TSH 7.207* 02/19/2008   FREET4 2.03* 11/17/2010     Pt denies weight gain, cold intolerance, heat intolerance, palpitations. She complains of cold hands.  Pt denies feeling nodules in neck, hoarseness, dysphagia/odynophagia, SOB with lying down.   No h/o radiation tx to head or neck. No recent use of iodine supplements.    ROS: Constitutional: no weight gain/loss, no fatigue, no subjective hyperthermia. She c/o cold hands . Eyes: no blurry vision, no xerophthalmia ENT: no sore throat, no nodules palpated in throat, no dysphagia/odynophagia, no hoarseness Cardiovascular: no CP/SOB/palpitations/leg swelling Respiratory: no cough/SOB Gastrointestinal: no N/V/D/C Musculoskeletal: no muscle/joint aches Skin: no rashes Neurological: no tremors/numbness/tingling/dizziness Psychiatric: no depression/anxiety  PE: BP 135/84 mmHg  Pulse 82  Ht 5\' 4"  (1.626 m)  Wt 185 lb (83.915 kg)  BMI 31.74 kg/m2  SpO2 97% Wt Readings from Last 3 Encounters:  08/27/15 185 lb (83.915 kg)  04/22/15 184 lb 9.6 oz (83.734 kg)  03/24/15 192 lb (87.091 kg)   Constitutional: overweight, in NAD Eyes: PERRLA, EOMI, no exophthalmos ENT: moist mucous membranes, no thyromegaly, no cervical lymphadenopathy Cardiovascular: RRR, No MRG Respiratory: CTA B Gastrointestinal: abdomen soft, NT, ND, BS+ Musculoskeletal: no deformities,  strength intact in all 4 Skin: moist, warm, no rashes Neurological: no tremor with outstretched hands, DTR normal in all 4  ASSESSMENT: 1. Hypothyroidism, postsurgical. Thyroid function tests consistent with over replacement.  PLAN:  Based on her most recent thyroid function test, she will need a lower dose of levothyroxine. I will lower her levothyroxine to 100 g by mouth every morning.  - We discussed about correct intake of levothyroxine, at fasting, with water, separated by at least 30 minutes from breakfast, and separated by more than 4 hours from calcium, iron, multivitamins, acid reflux medications (PPIs). -Patient is made aware of the fact that thyroid hormone replacement is needed for life, dose to be adjusted by periodic monitoring of thyroid function tests. - Will check thyroid tests before next visit: TSH, free T4 in 6 months. -Due to absence of clinical goiter, no need for thyroid ultrasound for now.  Marquis LunchGebre Kahron Kauth, MD Phone: [phone]  Fax: [phone]   08/27/2015, 11:24 AM

## 2015-09-01 ENCOUNTER — Other Ambulatory Visit: Payer: Self-pay | Admitting: Family Medicine

## 2015-09-03 ENCOUNTER — Encounter: Payer: Self-pay | Admitting: Family Medicine

## 2015-09-03 ENCOUNTER — Ambulatory Visit (INDEPENDENT_AMBULATORY_CARE_PROVIDER_SITE_OTHER): Payer: Medicare Other | Admitting: Family Medicine

## 2015-09-03 VITALS — BP 142/80 | HR 87 | Resp 16 | Ht 65.0 in | Wt 188.0 lb

## 2015-09-03 DIAGNOSIS — N3281 Overactive bladder: Secondary | ICD-10-CM

## 2015-09-03 DIAGNOSIS — L299 Pruritus, unspecified: Secondary | ICD-10-CM

## 2015-09-03 DIAGNOSIS — N39498 Other specified urinary incontinence: Secondary | ICD-10-CM

## 2015-09-03 DIAGNOSIS — Z23 Encounter for immunization: Secondary | ICD-10-CM

## 2015-09-03 DIAGNOSIS — Z0000 Encounter for general adult medical examination without abnormal findings: Secondary | ICD-10-CM | POA: Diagnosis not present

## 2015-09-03 DIAGNOSIS — M25562 Pain in left knee: Secondary | ICD-10-CM

## 2015-09-03 LAB — POCT URINALYSIS DIPSTICK
BILIRUBIN UA: NEGATIVE
Blood, UA: NEGATIVE
Glucose, UA: NEGATIVE
Ketones, UA: NEGATIVE
NITRITE UA: NEGATIVE
PH UA: 6
Protein, UA: NEGATIVE
SPEC GRAV UA: 1.02
Urobilinogen, UA: 0.2

## 2015-09-03 MED ORDER — LOSARTAN POTASSIUM 25 MG PO TABS: 25.0000 mg | ORAL_TABLET | Freq: Every day | ORAL | Status: DC

## 2015-09-03 MED ORDER — PANTOPRAZOLE SODIUM 40 MG PO TBEC: DELAYED_RELEASE_TABLET | ORAL | Status: DC

## 2015-09-03 MED ORDER — CITALOPRAM HYDROBROMIDE 10 MG PO TABS: 10.0000 mg | ORAL_TABLET | Freq: Every day | ORAL | Status: DC

## 2015-09-03 NOTE — Progress Notes (Signed)
Subjective:    Patient ID: Laura [name], female    DOB: 1951-07-08, 64 y.o.   MRN: [id number]  HPI  Preventive Screening-Counseling & Management   Patient present here today for a Medicare annual wellness visit. C/o increased left knee pain and instability with recent fall C/o increased pelvic pressure and sensation of bladder "dropping" with uncontrolled urinary incontinence C/o bilateral itchy ears, denies use of Q tips   Current Problems (verified)   Medications Prior to Visit Allergies (verified)   PAST HISTORY  Family History (verified)    Social History  Married in 1969 with 3 children, former smoker- quit in 1954 no drug use, retired from [employer]  Risk Factors  Current exercise habits:  Walks a few days a week but now her left knee has been hurting her so she has quit for now   Dietary issues discussed: heart healthy low carb and low fat    Cardiac risk factors:   Depression Screen  (Note: if answer to either of the following is "Yes", a more complete depression screening is indicated)   Over the past two weeks, have you felt down, depressed or hopeless? No  Over the past two weeks, have you felt little interest or pleasure in doing things? No  Have you lost interest or pleasure in daily life? No  Do you often feel hopeless? No  Do you cry easily over simple problems? No   Activities of Daily Living  In your present state of health, do you have any difficulty performing the following activities?  Driving?: No Managing money?: No Feeding yourself?:No Getting from bed to chair?:No Climbing a flight of stairs?: left knee pain but can climb some  Preparing food and eating?:No Bathing or showering?:No Getting dressed?:No Getting to the toilet?:No Using the toilet?:No Moving around from place to place?: No  Fall Risk Assessment In the past year have you fallen or had a near fall?:yes in past 3 week due to left knee pain and instability Are you  currently taking any medications that make you dizzy?:No   Hearing Difficulties: No Do you often ask people to speak up or repeat themselves?:No Do you experience ringing or noises in your ears?: no  Do you have difficulty understanding soft or whispered voices?:No  Cognitive Testing  Alert? Yes Normal Appearance?Yes  Oriented to person? Yes Place? Yes  Time? Yes  Displays appropriate judgment?Yes  Can read the correct time from a watch face? yes Are you having problems remembering things?No  Advanced Directives have been discussed with the patient?Yes, will give brochure , full code   List the Names of Other Physician/Practitioners you currently use:  Dr Darrick Penna (GI) Dr Ranell Patrick (orthopedic)  Dr Fransico Him (endo)   Indicate any recent Medical Services you may have received from other than [facility] providers in the past year (date may be approximate).   Assessment:    Welcome to medicare exam  Plan:    Patient Instructions (the written plan) was given to the patient.  Medicare Attestation  I have personally reviewed:  The patient's medical and social history  Their use of alcohol, tobacco or illicit drugs  Their current medications and supplements  The patient's functional ability including ADLs,fall risks, home safety risks, cognitive, and hearing and visual impairment  Diet and physical activities  Evidence for depression or mood disorders  The patient's weight, height, BMI, and visual acuity have been recorded in the chart. I have made referrals, counseling, and provided education to the patient  based on review of the above and I have provided the patient with a written personalized care plan for preventive services.     Review of Systems     Objective:   Physical Exam  BP 142/80 mmHg  Pulse 87  Resp 16  Ht 5\' 5"  (1.651 m)  Wt 188 lb (85.276 kg)  BMI 31.28 kg/m2  SpO2 97%  HEENT: TM clear bilaterally,  Mild erythema of external ear canals, no flaking noted  MS:  decreased ROM left knee with crepitus     Assessment & Plan:  Welcome to Medicare preventive visit  exam as documented. Counseling done  re healthy lifestyle involving commitment to 150 minutes exercise per week, heart healthy diet, and attaining healthy weight.The importance of adequate sleep also discussed. Regular seat belt use and home safety, is also discussed. Changes in health habits are decided on by the patient with goals and time frames  set for achieving them. Immunization and cancer screening needs are specifically addressed at this visit. EKG : NSR, no ischemic changes, no lVH. UA: trace leukocyte, needs c/s   Ear itching Topical med prescribed for symptoms, exam shows no evidence of infection  Urinary incontinence Uncontrolled , with sensation of vaginal fullness and pressure, refer to urology for re eval and management  Knee pain, left Reports left knee pain and instability, worsening in past 6 to 8 weeks, refer ortho for eval and management

## 2015-09-03 NOTE — Patient Instructions (Addendum)
Annual physical ibn 4.5 month, call if you need me sooner  Flu vaccine today  You are referred  To Dr Ranell Patrick for left knee pain and instability  Please work on  mind stimulating activities, also on writing a living will sincere you understand the need and want to do this as you should  Medication sent for ear itch  Keep home safe  Thanks for choosing [facility], we consider it a privelige to serve you. Please work on good  health habits so that your health will improve. 1. Commitment to daily physical activity for 30 to 60  minutes, if you are able to do this.  2. Commitment to wise food choices. Aim for half of your  food intake to be vegetable and fruit, one quarter starchy foods, and one quarter protein. Try to eat on a regular schedule  3 meals per day, snacking between meals should be limited to vegetables or fruits or small portions of nuts. 64 ounces of water per day is generally recommended, unless you have specific health conditions, like heart failure or kidney failure where you will need to limit fluid intake.  3. Commitment to sufficient and a  good quality of physical and mental rest daily, generally between 6 to 8 hours per day.  WITH PERSISTANCE AND PERSEVERANCE, THE IMPOSSIBLE , BECOMES THE NORM!  Fall Prevention in the Home  Falls can cause injuries. They can happen to people of all ages. There are many things you can do to make your home safe and to help prevent falls.  WHAT CAN I DO ON THE OUTSIDE OF MY HOME?  Regularly fix the edges of walkways and driveways and fix any cracks.  Remove anything that might make you trip as you walk through a door, such as a raised step or threshold.  Trim any bushes or trees on the path to your home.  Use bright outdoor lighting.  Clear any walking paths of anything that might make someone trip, such as rocks or tools.  Regularly check to see if handrails are loose or broken. Make sure that both sides of any steps  have handrails.  Any raised decks and porches should have guardrails on the edges.  Have any leaves, snow, or ice cleared regularly.  Use sand or salt on walking paths during winter.  Clean up any spills in your garage right away. This includes oil or grease spills. WHAT CAN I DO IN THE BATHROOM?   Use night lights.  Install grab bars by the toilet and in the tub and shower. Do not use towel bars as grab bars.  Use non-skid mats or decals in the tub or shower.  If you need to sit down in the shower, use a plastic, non-slip stool.  Keep the floor dry. Clean up any water that spills on the floor as soon as it happens.  Remove soap buildup in the tub or shower regularly.  Attach bath mats securely with double-sided non-slip rug tape.  Do not have throw rugs and other things on the floor that can make you trip. WHAT CAN I DO IN THE BEDROOM?  Use night lights.  Make sure that you have a light by your bed that is easy to reach.  Do not use any sheets or blankets that are too big for your bed. They should not hang down onto the floor.  Have a firm chair that has side arms. You can use this for support while you get dressed.  Do not have throw rugs and other things on the floor that can make you trip. WHAT CAN I DO IN THE KITCHEN?  Clean up any spills right away.  Avoid walking on wet floors.  Keep items that you use a lot in easy-to-reach places.  If you need to reach something above you, use a strong step stool that has a grab bar.  Keep electrical cords out of the way.  Do not use floor polish or wax that makes floors slippery. If you must use wax, use non-skid floor wax.  Do not have throw rugs and other things on the floor that can make you trip. WHAT CAN I DO WITH MY STAIRS?  Do not leave any items on the stairs.  Make sure that there are handrails on both sides of the stairs and use them. Fix handrails that are broken or loose. Make sure that handrails are as  long as the stairways.  Check any carpeting to make sure that it is firmly attached to the stairs. Fix any carpet that is loose or worn.  Avoid having throw rugs at the top or bottom of the stairs. If you do have throw rugs, attach them to the floor with carpet tape.  Make sure that you have a light switch at the top of the stairs and the bottom of the stairs. If you do not have them, ask someone to add them for you. WHAT ELSE CAN I DO TO HELP PREVENT FALLS?  Wear shoes that:  Do not have high heels.  Have rubber bottoms.  Are comfortable and fit you well.  Are closed at the toe. Do not wear sandals.  If you use a stepladder:  Make sure that it is fully opened. Do not climb a closed stepladder.  Make sure that both sides of the stepladder are locked into place.  Ask someone to hold it for you, if possible.  Clearly mark and make sure that you can see:  Any grab bars or handrails.  First and last steps.  Where the edge of each step is.  Use tools that help you move around (mobility aids) if they are needed. These include:  Canes.  Walkers.  Scooters.  Crutches.  Turn on the lights when you go into a dark area. Replace any light bulbs as soon as they burn out.  Set up your furniture so you have a clear path. Avoid moving your furniture around.  If any of your floors are uneven, fix them.  If there are any pets around you, be aware of where they are.  Review your medicines with your doctor. Some medicines can make you feel dizzy. This can increase your chance of falling. Ask your doctor what other things that you can do to help prevent falls.   This information is not intended to replace advice given to you by your health care provider. Make sure you discuss any questions you have with your health care provider.   Document Released: 08/13/2009 Document Revised: 03/03/2015 Document Reviewed: 11/21/2014 Elsevier Interactive Patient Education 2016 [employer]   You will be referred for OAB to urologist

## 2015-09-08 ENCOUNTER — Encounter: Payer: Self-pay | Admitting: Family Medicine

## 2015-09-08 ENCOUNTER — Other Ambulatory Visit: Payer: Self-pay | Admitting: Family Medicine

## 2015-09-08 DIAGNOSIS — Z0000 Encounter for general adult medical examination without abnormal findings: Secondary | ICD-10-CM | POA: Insufficient documentation

## 2015-09-08 DIAGNOSIS — M25561 Pain in right knee: Secondary | ICD-10-CM | POA: Diagnosis not present

## 2015-09-08 DIAGNOSIS — M5441 Lumbago with sciatica, right side: Secondary | ICD-10-CM | POA: Diagnosis not present

## 2015-09-08 DIAGNOSIS — M25562 Pain in left knee: Secondary | ICD-10-CM | POA: Insufficient documentation

## 2015-09-08 DIAGNOSIS — M5442 Lumbago with sciatica, left side: Secondary | ICD-10-CM | POA: Diagnosis not present

## 2015-09-08 NOTE — Assessment & Plan Note (Signed)
Uncontrolled , with sensation of vaginal fullness and pressure, refer to urology for re eval and management

## 2015-09-08 NOTE — Assessment & Plan Note (Signed)
Topical med prescribed for symptoms, exam shows no evidence of infection

## 2015-09-08 NOTE — Assessment & Plan Note (Addendum)
exam as documented. Counseling done  re healthy lifestyle involving commitment to 150 minutes exercise per week, heart healthy diet, and attaining healthy weight.The importance of adequate sleep also discussed. Regular seat belt use and home safety, is also discussed. Changes in health habits are decided on by the patient with goals and time frames  set for achieving them. Immunization and cancer screening needs are specifically addressed at this visit. EKG : NSR, no ischemic changes, no lVH. UA: trace leukocyte, needs c/s

## 2015-09-08 NOTE — Assessment & Plan Note (Signed)
Reports left knee pain and instability, worsening in past 6 to 8 weeks, refer ortho for eval and management

## 2015-09-09 ENCOUNTER — Telehealth: Payer: Self-pay | Admitting: Family Medicine

## 2015-09-09 MED ORDER — HYDROCORTISONE-ACETIC ACID 1-2 % OT SOLN: OTIC | Status: DC

## 2015-09-09 NOTE — Telephone Encounter (Signed)
Patient states that ear medication that was prescribed to her [facility] dont have it , she is asking is there anything over the counter she can use, please advise?

## 2015-09-09 NOTE — Telephone Encounter (Signed)
Med as been resent

## 2015-09-10 NOTE — Telephone Encounter (Signed)
Left message that med was resent in and to check with the pharmacy and if too expensive to call back

## 2015-09-11 ENCOUNTER — Ambulatory Visit (HOSPITAL_COMMUNITY): Payer: 59

## 2015-09-16 ENCOUNTER — Other Ambulatory Visit: Payer: Self-pay

## 2015-09-16 ENCOUNTER — Ambulatory Visit (HOSPITAL_COMMUNITY): Payer: Medicare Other

## 2015-09-16 ENCOUNTER — Encounter: Payer: Self-pay | Admitting: Gastroenterology

## 2015-09-16 ENCOUNTER — Ambulatory Visit (INDEPENDENT_AMBULATORY_CARE_PROVIDER_SITE_OTHER): Payer: Medicare Other | Admitting: Gastroenterology

## 2015-09-16 VITALS — BP 130/79 | HR 77 | Temp 97.3°F | Ht 64.0 in | Wt 184.2 lb

## 2015-09-16 DIAGNOSIS — K625 Hemorrhage of anus and rectum: Secondary | ICD-10-CM

## 2015-09-16 DIAGNOSIS — R194 Change in bowel habit: Secondary | ICD-10-CM

## 2015-09-16 DIAGNOSIS — M5442 Lumbago with sciatica, left side: Secondary | ICD-10-CM | POA: Diagnosis not present

## 2015-09-16 DIAGNOSIS — M5441 Lumbago with sciatica, right side: Secondary | ICD-10-CM | POA: Diagnosis not present

## 2015-09-16 MED ORDER — LINACLOTIDE 290 MCG PO CAPS: 290.0000 ug | ORAL_CAPSULE | Freq: Every day | ORAL | Status: DC

## 2015-09-16 MED ORDER — NA SULFATE-K SULFATE-MG SULF 17.5-3.13-1.6 GM/177ML PO SOLN: 1.0000 | Freq: Once | ORAL | Status: DC

## 2015-09-16 MED ORDER — NITROGLYCERIN 0.4 % RE OINT: TOPICAL_OINTMENT | RECTAL | Status: DC

## 2015-09-16 NOTE — Progress Notes (Signed)
ON RECALL  °

## 2015-09-16 NOTE — Patient Instructions (Addendum)
Your rectal bleeding is most likley due to an anal fissure.  DRINK WATER TO KEEP YOUR URINE LIGHT YELLOW.  USE COLACE ONE PILL 2 OR 3 TIMES A DAY FOR THE 1 TO 3 MONTHS.  TAKE LINZESS WITH FOOD. I INCREASED THE DOSE.  ADD RECTIV. USE a pea sized amount internally four times daily FOR ONE MONTH. IT MAY CAUSE HEADACHES OR DROP IN BLOOD PRESSURE. TAKE FIRST DOSE AND REMAIN SEATED OR LAYING DOWN FOR 10-15 MINS.  FULL LIQUID DIET DEC 4. CLEAR LIQUIDS DEC 5. COLONOSCOPY DEC 6.  FOLLOW UP IN 4 MOS.      Anal Fissure, Adult  An anal fissure is a small tear or crack in the skin around the anus. Bleeding from a fissure usually stops on its own within a few minutes. However, bleeding will often reoccur with each bowel movement until the crack heals.   CAUSES  Passing large, hard stools.  Frequent diarrheal stools.  Constipation.    SYMPTOMS  Small amounts of blood seen on your stools, on toilet paper, or in the toilet after a bowel movement.  Rectal bleeding.  Painful bowel movements.  Itching or irritation around the anus.  TREATMENT  You may be instructed to take fiber supplements. These supplements can soften your stool to help make bowel movements easier.  Sitz baths may be recommended to help heal the tear. Do not use soap in the sitz baths.  A medicated cream or ointment may be prescribed to lessen discomfort.  HOME CARE INSTRUCTIONS  Maintain a diet high in fruits, whole grains, and vegetables. Avoid constipating foods like bananas and dairy products.  Take sitz baths as directed by your caregiver.  Drink enough fluids to keep your urine clear or pale yellow.  Only take over-the-counter or prescription medicines for pain, discomfort, or fever as directed by your caregiver.

## 2015-09-16 NOTE — Assessment & Plan Note (Signed)
MOST LIKELY DUE TO ANAL FISSURE. DIFFERENTIAL DIAGNOSIS INCLUDES HEMORRHOIDS, COLON POLYPS, AVMs, & LESS LIKELY COLON CA.   DRINK WATER TO KEEP YOUR URINE LIGHT YELLOW. USE COLACE ONE PILL 2 OR 3 TIMES A DAY FOR THE 1 TO 3 MONTHS. TAKE LINZESS WITH FOOD. I INCREASED THE DOSE. ADD RECTIV. USE a pea sized amount internally four times daily FOR ONE MONTH. IT MAY CAUSE HEADACHES OR DROP IN BLOOD PRESSURE. TAKE FIRST DOSE AND REMAIN SEATED OR LAYING DOWN FOR 10-15 MINS. FOLLOW UP IN 4 MOS.

## 2015-09-16 NOTE — Progress Notes (Addendum)
Subjective:    Patient ID: [name], female    DOB: February 05, 1951, 64 y.o.   MRN: [id number]  [name], MD  HPI Change in bowel habits since OCT 2016. WAS HAVING A BM-DAILY WITH LINZESS. AND NOW SCARED TO HAVE A BM SINCE OCT 2016 BECAISE IT HURTS. INITALLY HAD TROUBLE PASSING STOOL AND EVER SINCE THEN HAS HAD SEVERE RECTAL PAIN WHEN STOOL PASSES. NO RECTAL PRESSURE, ITCHING, BURNING, OR SOILING. SAW BLOOD 3 TIMES. LAST TIME LAST PART OCT 2016. NO CHANGE IN MEDS OR NEW OTC SUPPLEMENTS. NAUSEA: 1-2X/MO IN THE AM. WHEN SHE FEELS URGE TO HAVE A BM, HAS ABDOMINAL CRAMPS AND IT GETS BETTER AFTER A BM. LAST TCS OCT 2012 WITH LESS THAN IDEAL PREP. HAD SYNTHROID DOSE REDUCED OCT 11 BUT SYMPTOMS STARTED GETTING WORSE BEFORE.  PT DENIES FEVER, CHILLS, HEMATOCHEZIA,  vomiting, melena, diarrhea, CHEST PAIN, SHORTNESS OF BREATH,  problems swallowing, problems with sedation, OR  heartburn or indigestion.  Past Medical History  Diagnosis Date  . GERD (gastroesophageal reflux disease)   . Gastritis   . Hypertension   . Hyperlipidemia   . Anemia   . Cystitis   . Osteopenia   . Shoulder pain   . Hypothyroidism   . Hashimoto's thyroiditis     Hx   . Urinary incontinence   . Anxiety   . Depression     Past Surgical History  Procedure Laterality Date  . Total abdominal hysterectomy  1994  . Umbilical hernia repair    . Breast excisional biopsy  2010    Excisional biopsy of benign left breast mass -lopoma   . Resection of left lobe of thyroid    . Bilateral tubal ligation    . Right carpal tunnel release    . Urethral dilation for stenosis  2009  . Rt. knee athroscopy  2004  . Colonscopy  2005    Dr. Katrinka BlazingSmith  . Esophagogastroduodenoscopy  12/2009    chronic gastritis  . Breast surgery      left nreast biopsy  . Colonoscopy  08/26/2011    SLF: 1. Internal hemorrhoids    Allergies  Allergen Reactions  . Dilaudid [Hydromorphone Hcl]   . Levaquin [Levofloxacin] Other (See Comments)     Dry throat, no raste  . Nsaids     REACTION: per GI pt no longer to use NSAIDS    Current Outpatient Prescriptions  Medication Sig Dispense Refill  . BIOTIN PO Take by mouth.    . citalopram (CELEXA) 10 MG tablet Take 1 tablet (10 mg total) by mouth daily.    . hydrochlorothiazide 25 MG tablet TAKE 1 TABLET BY MOUTH EVERY DAY    . imipramine (TOFRANIL) 25 MG tablet Take 1 tablet (25 mg total) by mouth at bedtime.    Marland Kitchen. SYNTHROID 100 MCG tablet Take 1 tablet daily before breakfast.    . Linaclotide (LINZESS) 145 MCG CAPS capsule 1 PO 30 mins prior to your first meal    . losartan (COZAAR) 25 MG tablet Take 1 tablet (25 mg total) by mouth daily.    Marland Kitchen. lovastatin (MEVACOR) 40 MG tablet TAKE 2 TABLETS BY MOUTH EVERY NIGHT AT BEDTIME FOR CHOLESTEROL    . lovastatin (MEVACOR) 40 MG tablet TAKE 2 TABLETS BY MOUTH EVERY NIGHT AT BEDTIME FOR CHOLESTEROL    . Multiple Vitamins-Minerals (CENTRUM ADULTS PO) Take 1 tablet by mouth daily.    . pantoprazole (PROTONIX) 40 MG tablet TAKE 1 TABLET BY MOUTH TWICE DAILY    .  potassium chloride (K-DUR,KLOR-CON) 10 MEQ tablet Take 1 tablet (10 mEq total) by mouth 2 (two) times daily.    . acetic acid-hydrocortisone (VOSOL-HC) otic solution Three drops to affected ear(s) three times daily for 5 days, then as needed (Patient not taking: Reported on 09/16/2015)     Review of Systems PER HPI OTHERWISE ALL SYSTEMS ARE NEGATIVE.    Objective:   Physical Exam  Constitutional: She is oriented to person, place, and time. She appears well-developed and well-nourished. No distress.  HENT:  Head: Normocephalic and atraumatic.  Mouth/Throat: Oropharynx is clear and moist. No oropharyngeal exudate.  Eyes: Pupils are equal, round, and reactive to light. No scleral icterus.  Neck: Normal range of motion. Neck supple.  Cardiovascular: Normal rate, regular rhythm and normal heart sounds.   Pulmonary/Chest: Effort normal and breath sounds normal. No respiratory distress.    Abdominal: Soft. Bowel sounds are normal. She exhibits no distension.  Musculoskeletal: She exhibits no edema.  Lymphadenopathy:    She has no cervical adenopathy.  Neurological: She is alert and oriented to person, place, and time.  NO FOCAL DEFICITS   Psychiatric:  ANXIOUS MOOD, nl affect  Vitals reviewed.         Assessment & Plan:   

## 2015-09-16 NOTE — Progress Notes (Signed)
CC'ED TO PCP 

## 2015-09-16 NOTE — Assessment & Plan Note (Signed)
MOST LIKELY DUE TO IBS AND EXACERBATED BY LOWER SYNTHROID DOSE.  DRINK WATER TO KEEP YOUR URINE LIGHT YELLOW. USE COLACE ONE PILL 2 OR 3 TIMES A DAY FOR THE 1 TO 3 MONTHS. TAKE LINZESS WITH FOOD. I INCREASED THE DOSE. ADD RECTIV. USE a pea sized amount internally four times daily FOR ONE MONTH. IT MAY CAUSE HEADACHES OR DROP IN BLOOD PRESSURE. TAKE FIRST DOSE AND REMAIN SEATED OR LAYING DOWN FOR 10-15 MINS.  FOLLOW UP IN 4 MOS.

## 2015-09-17 ENCOUNTER — Telehealth: Payer: Self-pay | Admitting: Gastroenterology

## 2015-09-17 ENCOUNTER — Ambulatory Visit (HOSPITAL_COMMUNITY)
Admission: RE | Admit: 2015-09-17 | Discharge: 2015-09-17 | Disposition: A | Discharge status: Home / Self Care | Payer: Medicare Other | Source: Ambulatory Visit | Attending: Family Medicine | Admitting: Family Medicine

## 2015-09-17 ENCOUNTER — Telehealth: Payer: Self-pay

## 2015-09-17 DIAGNOSIS — Z1231 Encounter for screening mammogram for malignant neoplasm of breast: Secondary | ICD-10-CM | POA: Diagnosis not present

## 2015-09-17 IMAGING — MG MM DIGITAL SCREENING
8 series · 8 of 24 positions shown · non-contrast
Comparison: None.

CLINICAL DATA: Screening.

EXAM:
DIGITAL SCREENING BILATERAL MAMMOGRAM WITH 3D TOMO WITH CAD

[R CC]
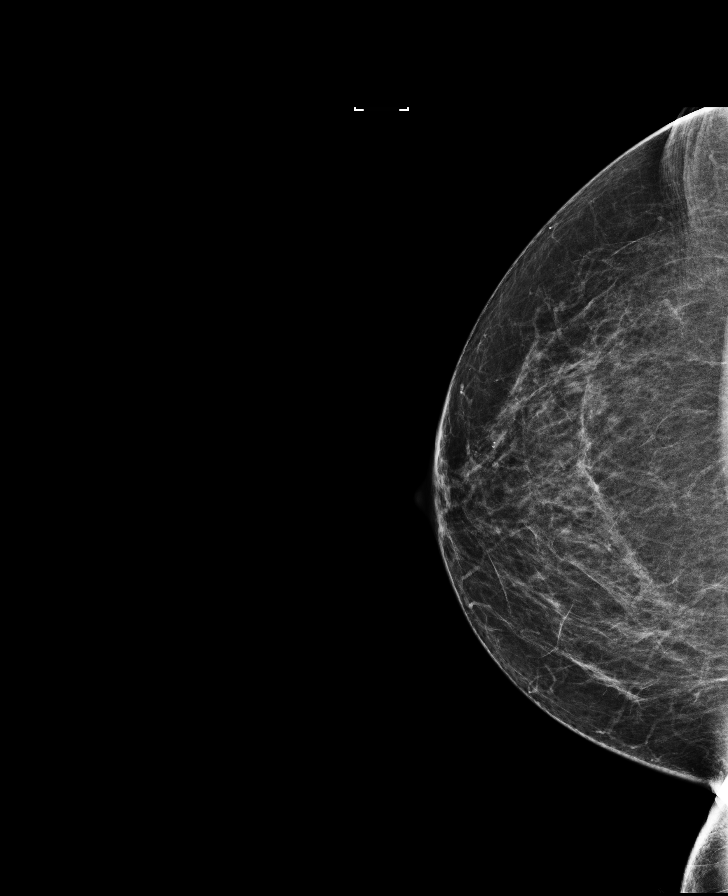

[R MLO]
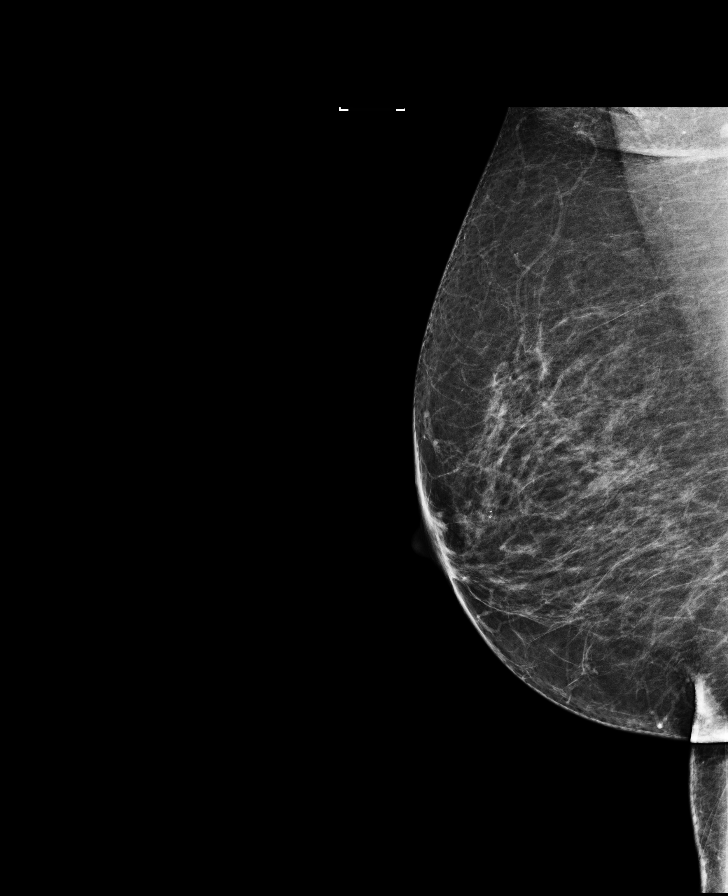

[L MLO]
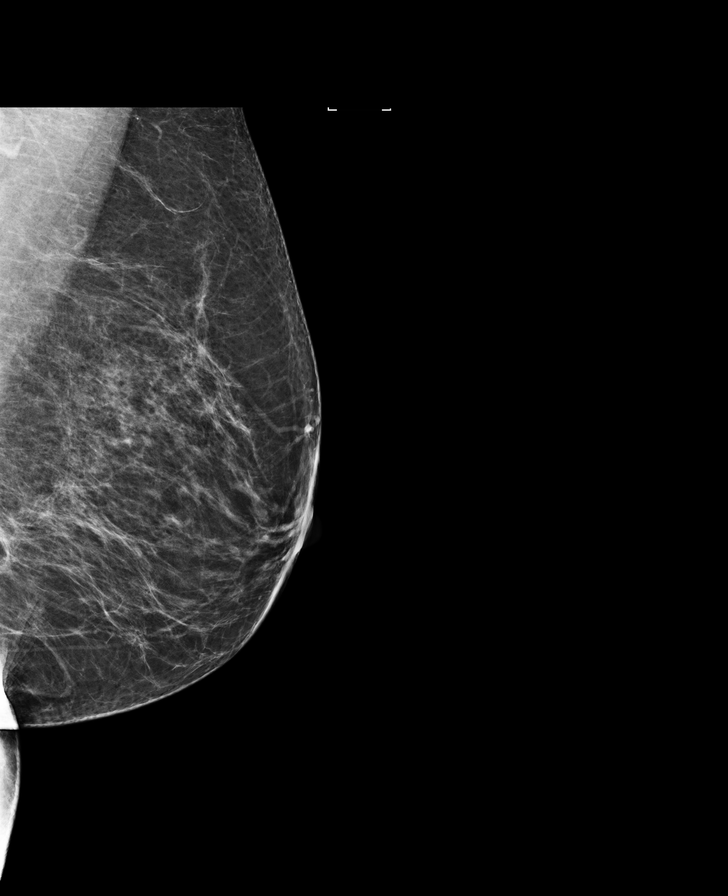

[L CC]
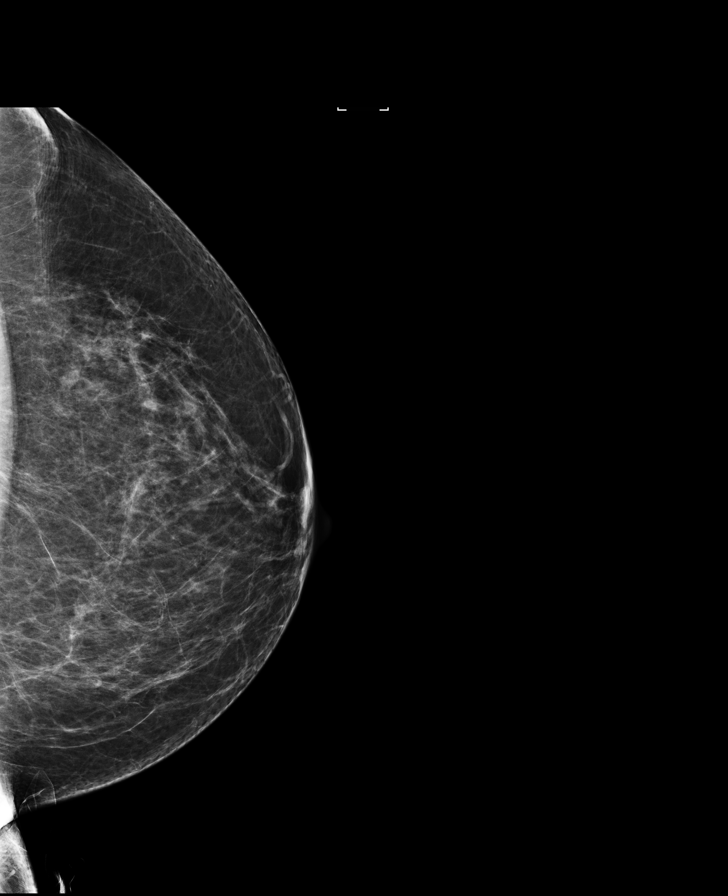

[R CC tomo · tomo slice 41/82.0]
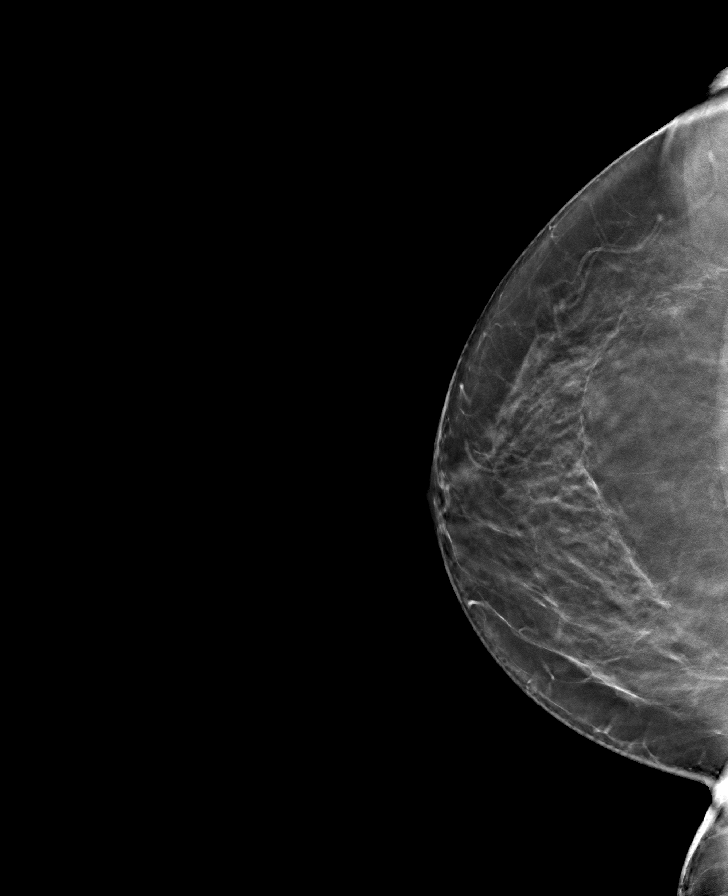

[L MLO tomo · tomo slice 42/83.0]
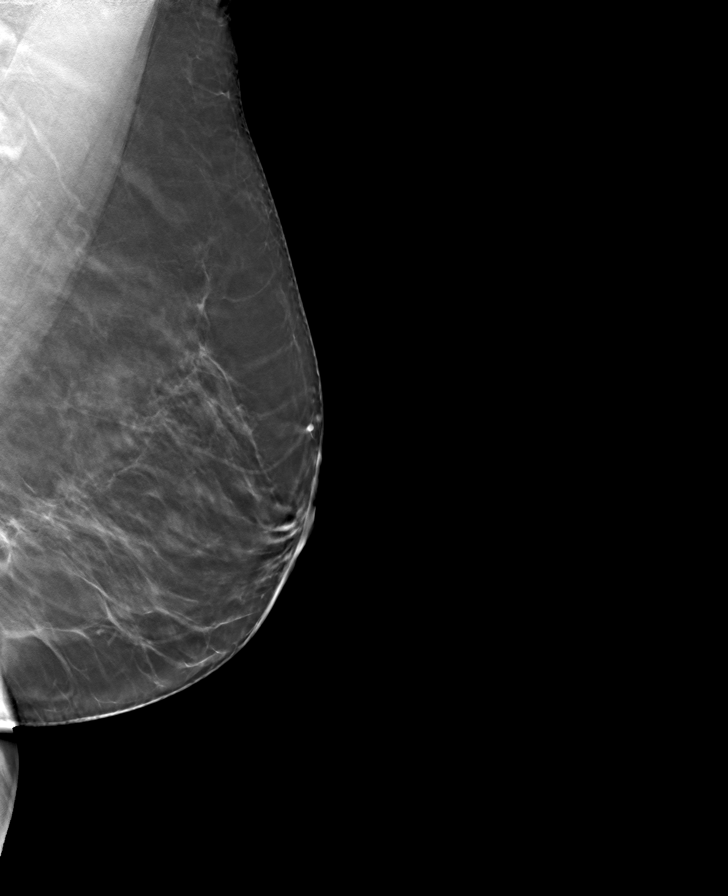

[L CC tomo · tomo slice 41/82.0]
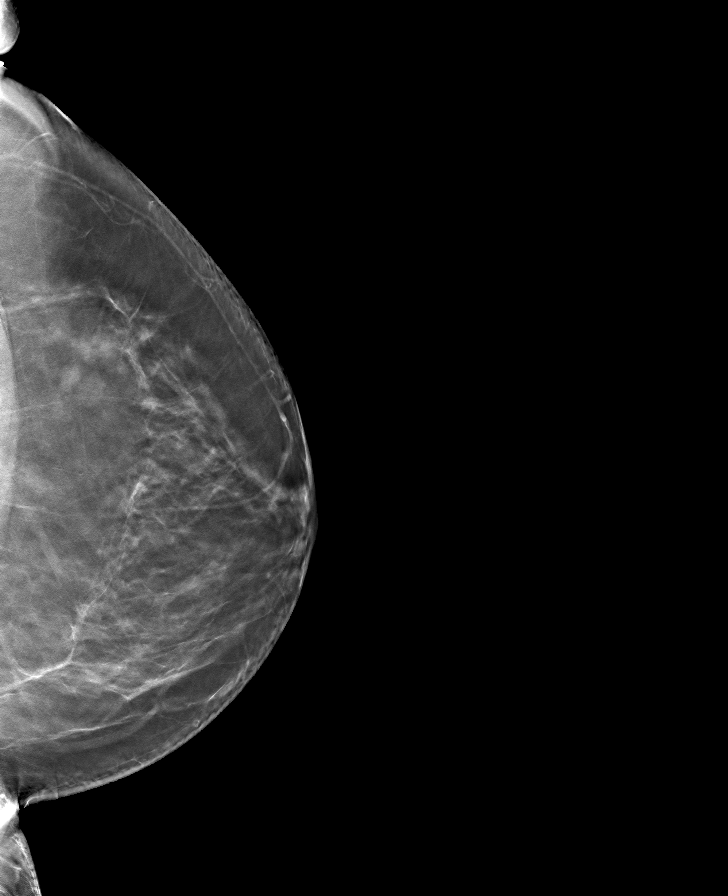

[R MLO tomo · tomo slice 39/78.0]
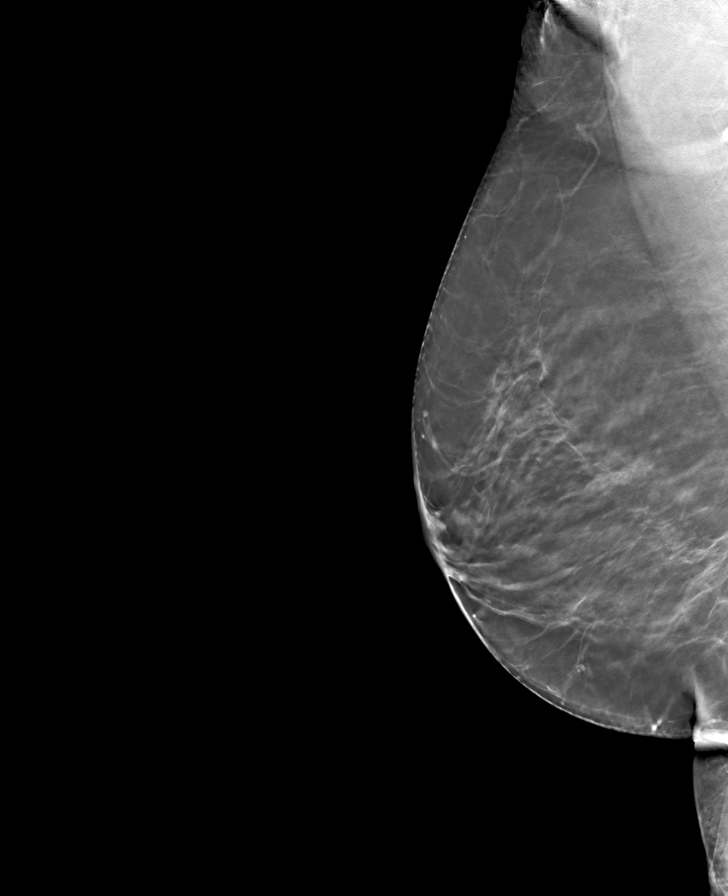

[8 of 24 positions shown; findings below may reference images not displayed]

ACR Breast Density Category b: There are scattered areas of
fibroglandular density.
FINDINGS: There are no findings suspicious for malignancy. Images were
processed with CAD.
IMPRESSION: No mammographic evidence of malignancy. A result letter of this
screening mammogram will be mailed directly to the patient.

RECOMMENDATION:
Screening mammogram in one year. (Code:[N7])

BI-RADS CATEGORY  1: Negative.

## 2015-09-17 NOTE — Telephone Encounter (Signed)
PLEASE CALL PT. THERE IS NO ALTERNATIVE.

## 2015-09-17 NOTE — Telephone Encounter (Signed)
Pt came by this morning because the Rectiv is going to cost her $180.00. Is there something different she can use or can we get her the [facility] cream for her. Please advise

## 2015-09-17 NOTE — Telephone Encounter (Signed)
REVIEWED-NO ADDITIONAL RECOMMENDATIONS. 

## 2015-09-17 NOTE — Telephone Encounter (Signed)
Please see previous note. Ginger has taken care of it.

## 2015-09-17 NOTE — Telephone Encounter (Signed)
Patient called to let SF know that the prescription she picked up today was too expensive and she needs something else.

## 2015-09-17 NOTE — Telephone Encounter (Signed)
I called the [facility] and talked to [place] and he is going to fill for her the [place] [employer] [place] [employer] [facility] % with the same intructions PRE SLF. Pt is aware

## 2015-09-18 DIAGNOSIS — M5441 Lumbago with sciatica, right side: Secondary | ICD-10-CM | POA: Diagnosis not present

## 2015-09-18 DIAGNOSIS — M5442 Lumbago with sciatica, left side: Secondary | ICD-10-CM | POA: Diagnosis not present

## 2015-09-21 DIAGNOSIS — M5442 Lumbago with sciatica, left side: Secondary | ICD-10-CM | POA: Diagnosis not present

## 2015-09-21 DIAGNOSIS — M5441 Lumbago with sciatica, right side: Secondary | ICD-10-CM | POA: Diagnosis not present

## 2015-09-25 ENCOUNTER — Emergency Department (HOSPITAL_COMMUNITY)
Admission: EM | Admit: 2015-09-25 | Discharge: 2015-09-25 | Disposition: A | Discharge status: Home / Self Care | Payer: Medicare Other | Attending: Emergency Medicine | Admitting: Emergency Medicine

## 2015-09-25 ENCOUNTER — Emergency Department (HOSPITAL_COMMUNITY): Payer: Medicare Other

## 2015-09-25 ENCOUNTER — Encounter (HOSPITAL_COMMUNITY): Payer: Self-pay | Admitting: Emergency Medicine

## 2015-09-25 DIAGNOSIS — E785 Hyperlipidemia, unspecified: Secondary | ICD-10-CM | POA: Diagnosis not present

## 2015-09-25 DIAGNOSIS — R1013 Epigastric pain: Secondary | ICD-10-CM | POA: Diagnosis not present

## 2015-09-25 DIAGNOSIS — Z79899 Other long term (current) drug therapy: Secondary | ICD-10-CM | POA: Insufficient documentation

## 2015-09-25 DIAGNOSIS — R1033 Periumbilical pain: Secondary | ICD-10-CM | POA: Diagnosis present

## 2015-09-25 DIAGNOSIS — K5900 Constipation, unspecified: Secondary | ICD-10-CM

## 2015-09-25 DIAGNOSIS — Z87891 Personal history of nicotine dependence: Secondary | ICD-10-CM | POA: Diagnosis not present

## 2015-09-25 DIAGNOSIS — Z9889 Other specified postprocedural states: Secondary | ICD-10-CM | POA: Diagnosis not present

## 2015-09-25 DIAGNOSIS — F329 Major depressive disorder, single episode, unspecified: Secondary | ICD-10-CM | POA: Insufficient documentation

## 2015-09-25 DIAGNOSIS — F419 Anxiety disorder, unspecified: Secondary | ICD-10-CM | POA: Diagnosis not present

## 2015-09-25 DIAGNOSIS — Z862 Personal history of diseases of the blood and blood-forming organs and certain disorders involving the immune mechanism: Secondary | ICD-10-CM | POA: Insufficient documentation

## 2015-09-25 DIAGNOSIS — Z8739 Personal history of other diseases of the musculoskeletal system and connective tissue: Secondary | ICD-10-CM | POA: Insufficient documentation

## 2015-09-25 DIAGNOSIS — K219 Gastro-esophageal reflux disease without esophagitis: Secondary | ICD-10-CM | POA: Diagnosis not present

## 2015-09-25 DIAGNOSIS — R109 Unspecified abdominal pain: Secondary | ICD-10-CM

## 2015-09-25 DIAGNOSIS — E039 Hypothyroidism, unspecified: Secondary | ICD-10-CM | POA: Diagnosis not present

## 2015-09-25 DIAGNOSIS — E063 Autoimmune thyroiditis: Secondary | ICD-10-CM | POA: Diagnosis not present

## 2015-09-25 DIAGNOSIS — Z90710 Acquired absence of both cervix and uterus: Secondary | ICD-10-CM | POA: Insufficient documentation

## 2015-09-25 DIAGNOSIS — R1032 Left lower quadrant pain: Secondary | ICD-10-CM | POA: Diagnosis not present

## 2015-09-25 DIAGNOSIS — R112 Nausea with vomiting, unspecified: Secondary | ICD-10-CM | POA: Diagnosis not present

## 2015-09-25 DIAGNOSIS — I10 Essential (primary) hypertension: Secondary | ICD-10-CM | POA: Insufficient documentation

## 2015-09-25 LAB — CBC WITH DIFFERENTIAL/PLATELET
BASOS ABS: 0 10*3/uL (ref 0.0–0.1)
BASOS PCT: 0 %
Eosinophils Absolute: 0 10*3/uL (ref 0.0–0.7)
Eosinophils Relative: 0 %
HEMATOCRIT: 33.9 % — AB (ref 36.0–46.0)
HEMOGLOBIN: 11.1 g/dL — AB (ref 12.0–15.0)
LYMPHS PCT: 16 %
Lymphs Abs: 1.8 10*3/uL (ref 0.7–4.0)
MCH: 27.6 pg (ref 26.0–34.0)
MCHC: 32.7 g/dL (ref 30.0–36.0)
MCV: 84.3 fL (ref 78.0–100.0)
Monocytes Absolute: 0.6 10*3/uL (ref 0.1–1.0)
Monocytes Relative: 6 %
Neutro Abs: 8.6 10*3/uL — ABNORMAL HIGH (ref 1.7–7.7)
Neutrophils Relative %: 78 %
Platelets: 270 10*3/uL (ref 150–400)
RBC: 4.02 MIL/uL (ref 3.87–5.11)
RDW: 12.8 % (ref 11.5–15.5)
WBC: 11.1 10*3/uL — ABNORMAL HIGH (ref 4.0–10.5)

## 2015-09-25 LAB — COMPREHENSIVE METABOLIC PANEL
ALBUMIN: 4.1 g/dL (ref 3.5–5.0)
ALT: 21 U/L (ref 14–54)
AST: 23 U/L (ref 15–41)
Alkaline Phosphatase: 73 U/L (ref 38–126)
Anion gap: 12 (ref 5–15)
BILIRUBIN TOTAL: 0.7 mg/dL (ref 0.3–1.2)
BUN: 14 mg/dL (ref 6–20)
CO2: 28 mmol/L (ref 22–32)
CREATININE: 0.87 mg/dL (ref 0.44–1.00)
Calcium: 9.4 mg/dL (ref 8.9–10.3)
Chloride: 96 mmol/L — ABNORMAL LOW (ref 101–111)
GFR calc Af Amer: >60 mL/min (ref >60)
GLUCOSE: 103 mg/dL — AB (ref 65–99)
POTASSIUM: 2.9 mmol/L — AB (ref 3.5–5.1)
Sodium: 136 mmol/L (ref 135–145)
TOTAL PROTEIN: 7.5 g/dL (ref 6.5–8.1)

## 2015-09-25 LAB — LIPASE, BLOOD: LIPASE: 22 U/L (ref 11–51)

## 2015-09-25 LAB — POC OCCULT BLOOD, ED: Fecal Occult Bld: NEGATIVE

## 2015-09-25 LAB — TROPONIN I: Troponin I: <0.03 ng/mL (ref <0.031)

## 2015-09-25 IMAGING — CT CT ABD-PELV W/ CM
2 of 5 series · 16 of 46 positions shown, 18 images · IV contrast (Omnipaque 300)
Comparison: CT scan [DATE]

CLINICAL DATA: Abdominal pain, nausea, vomiting, constipation,
history of diverticulitis

EXAM:
CT ABDOMEN AND PELVIS WITH CONTRAST
TECHNIQUE: Multidetector CT imaging of the abdomen and pelvis was performed
using the standard protocol following bolus administration of
intravenous contrast.
CONTRAST:  100mL OMNIPAQUE IOHEXOL 300 MG/ML  SOLN

[Series 2: abd_pel_with 5.0 b40f · axial · 0.73mm/px · z∈[+708,+1104]mm · 13 of 91 slices shown, 15 images]
[im 6/91  soft-tissue]
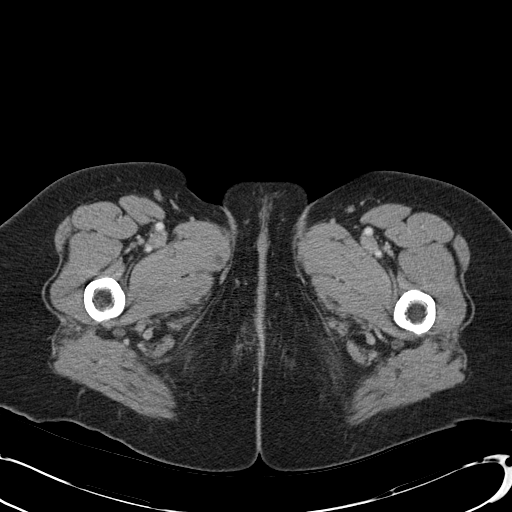
[im 6/91  bone]
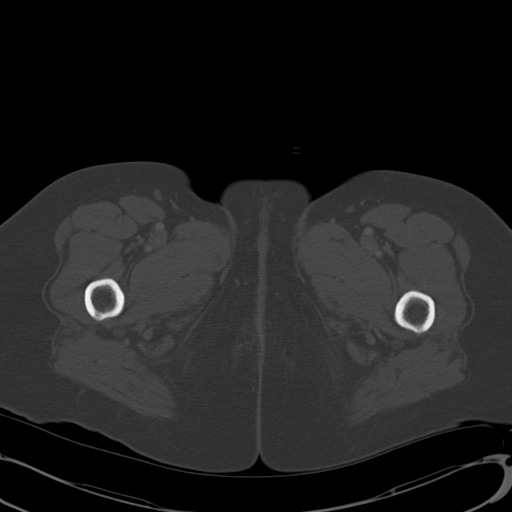
[im 11/91  soft-tissue]
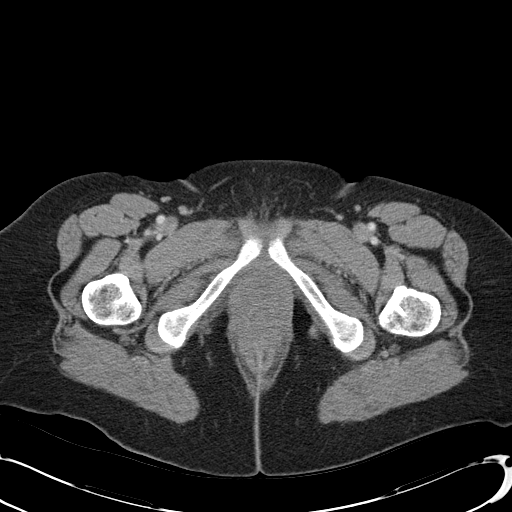
[im 22/91  soft-tissue]
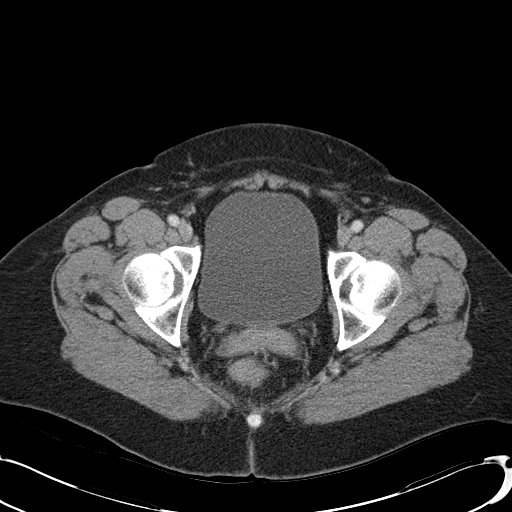
[im 27/91  soft-tissue]
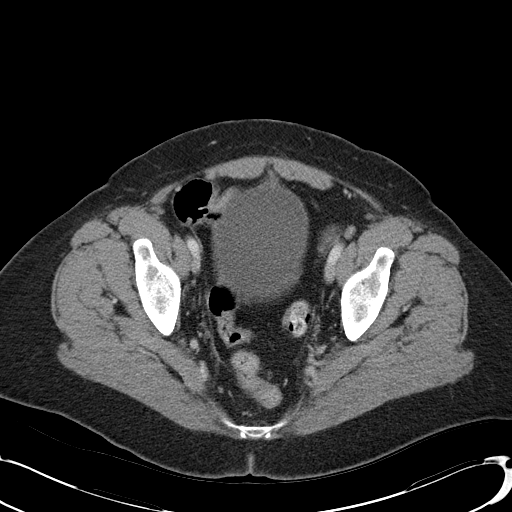
[im 32/91  soft-tissue]
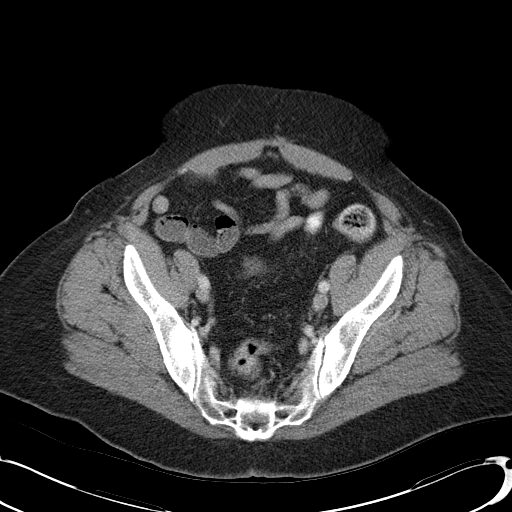
[im 38/91  soft-tissue]
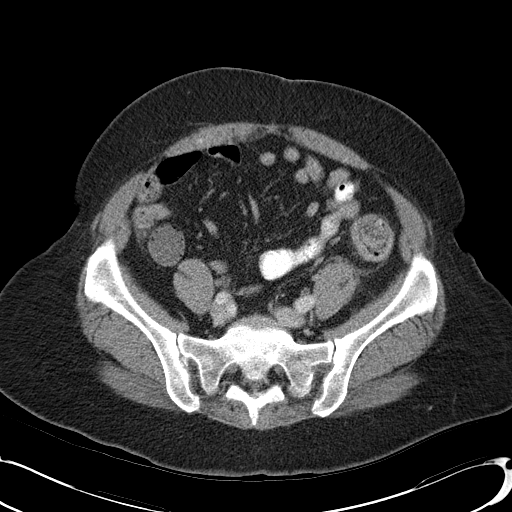
[im 48/91  soft-tissue]
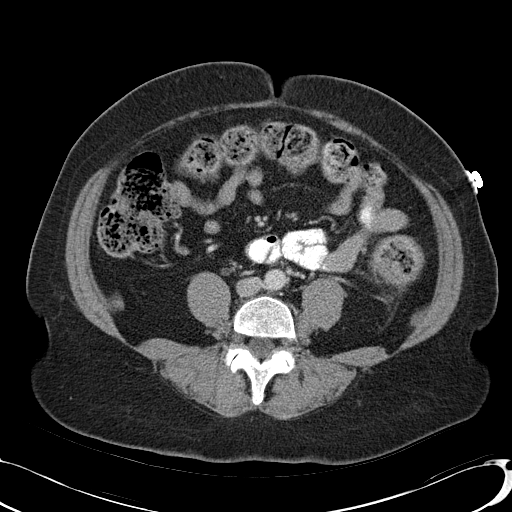
[im 53/91  soft-tissue]
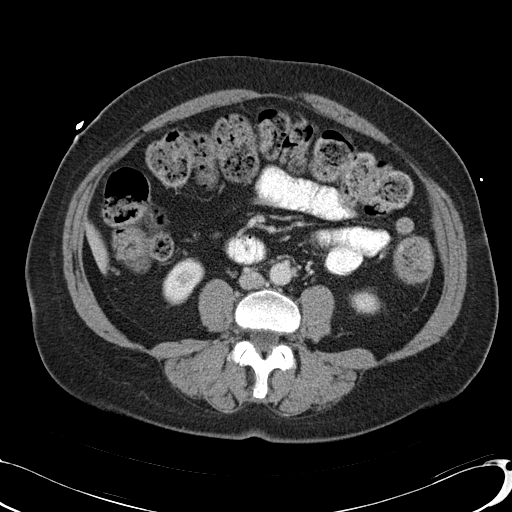
[im 59/91  soft-tissue]
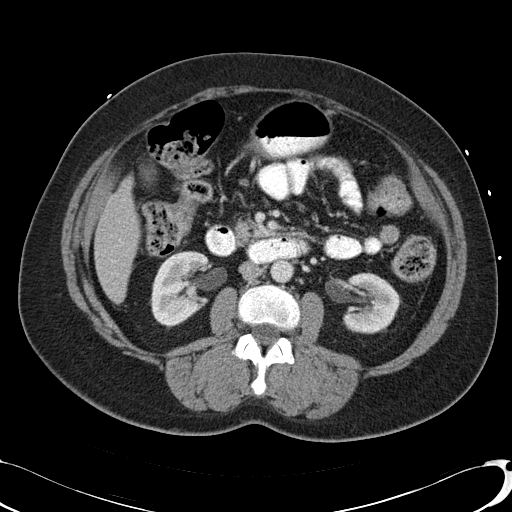
[im 59/91  bone]
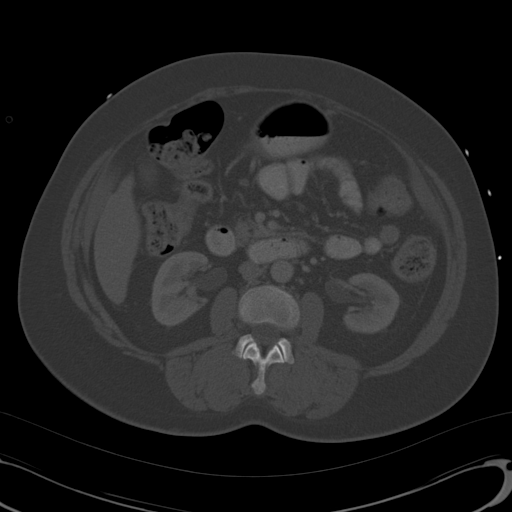
[im 64/91  soft-tissue]
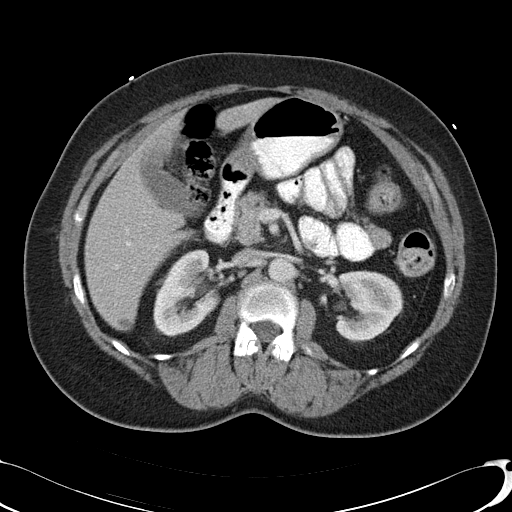
[im 69/91  soft-tissue]
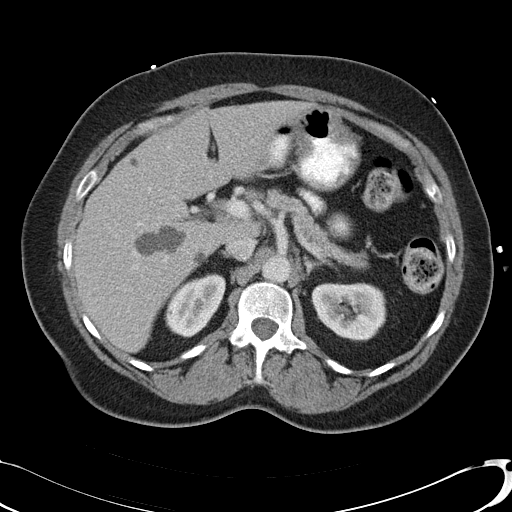
[im 80/91  soft-tissue]
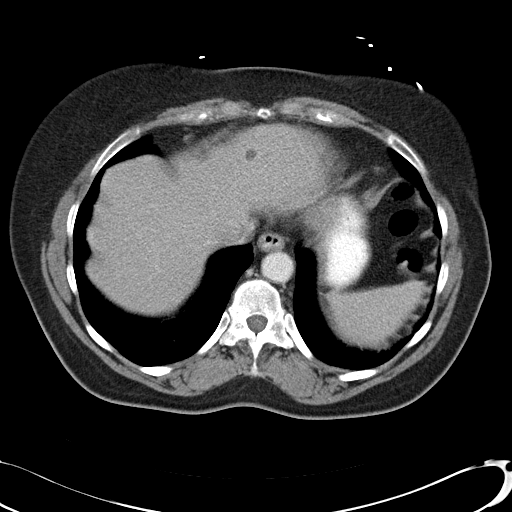
[im 85/91  soft-tissue]
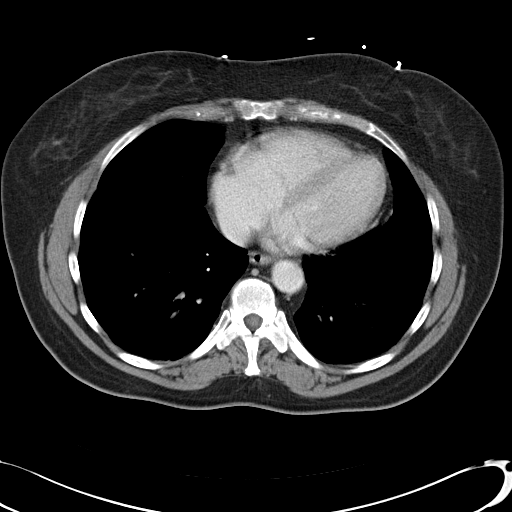

[Series 3: abd_pel_with 3.0 spo cor · coronal · 0.75mm/px · 3 of 108 slices shown]
[im 36/108  soft-tissue]
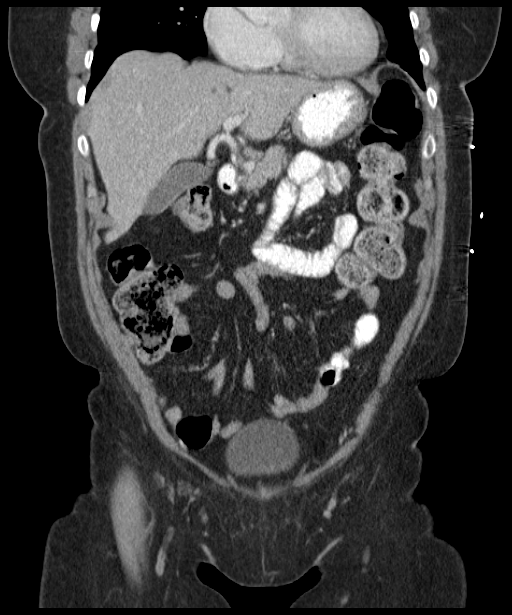
[im 48/108  soft-tissue]
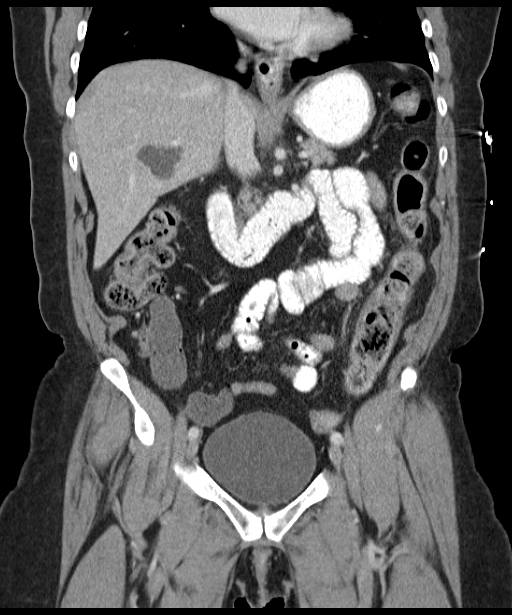
[im 60/108  soft-tissue]
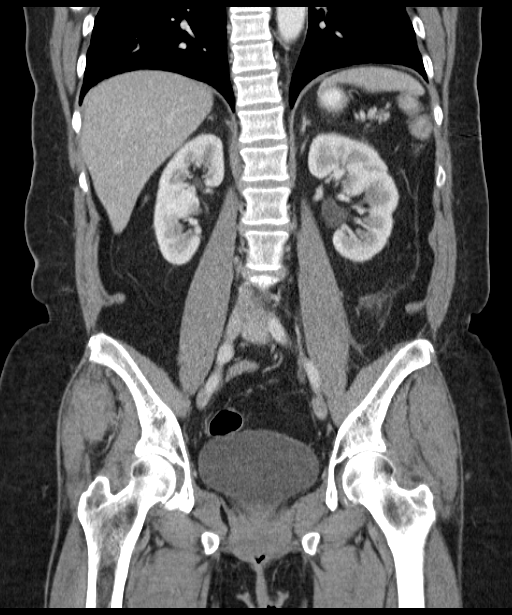

[16 of 46 positions shown; findings below may reference images not displayed]

FINDINGS: The lung bases are unremarkable. Sagittal images of the spine are
unremarkable.

Scattered hepatic cysts are stable from prior exam. The pancreas,
spleen and adrenal glands are unremarkable. No aortic aneurysm.
Kidneys are symmetrical in size and enhancement. No hydronephrosis
or hydroureter. Delayed renal images shows bilateral renal
symmetrical excretion. Bilateral visualized proximal ureter is
unremarkable.

No small bowel obstruction.  No ascites or free air.

Abundant stool noted in right colon and transverse colon. There is
no pericecal inflammation. Some fecal like material noted within
terminal ileum suspicious for incompetent ileocecal valve. The
appendix is not identified.

Moderate stool noted in descending colon. Axial image 44 there is
mild thickening of colonic wall in descending colon and mild
stranding of surrounding fat. Mild focal colitis is suspected. The
sigmoid colon is empty partially collapsed. The urinary bladder is
unremarkable. The patient is status post hysterectomy.
IMPRESSION: 1. Abundant stool noted in right colon and transverse colon.
Moderate stool noted in descending colon. Subtle mild stranding of
pericolonic fat in left colon. Mild thickening of colonic wall in
distal left colon axial image 46 and 44. Mild stercoral colitis
cannot be excluded.
2. No small bowel obstruction.
3. Stable hepatic cysts.
4. No hydronephrosis or hydroureter.
5. Status post hysterectomy.

## 2015-09-25 IMAGING — DX DG ABDOMEN ACUTE W/ 1V CHEST
3 series · 3 of 3 positions shown · non-contrast
Comparison: Chest x-ray [DATE] and lumbar spine [DATE]

CLINICAL DATA: Abdominal pain, nausea, vomiting and constipation
today.

EXAM:
DG ABDOMEN ACUTE W/ 1V CHEST

[chest pa]
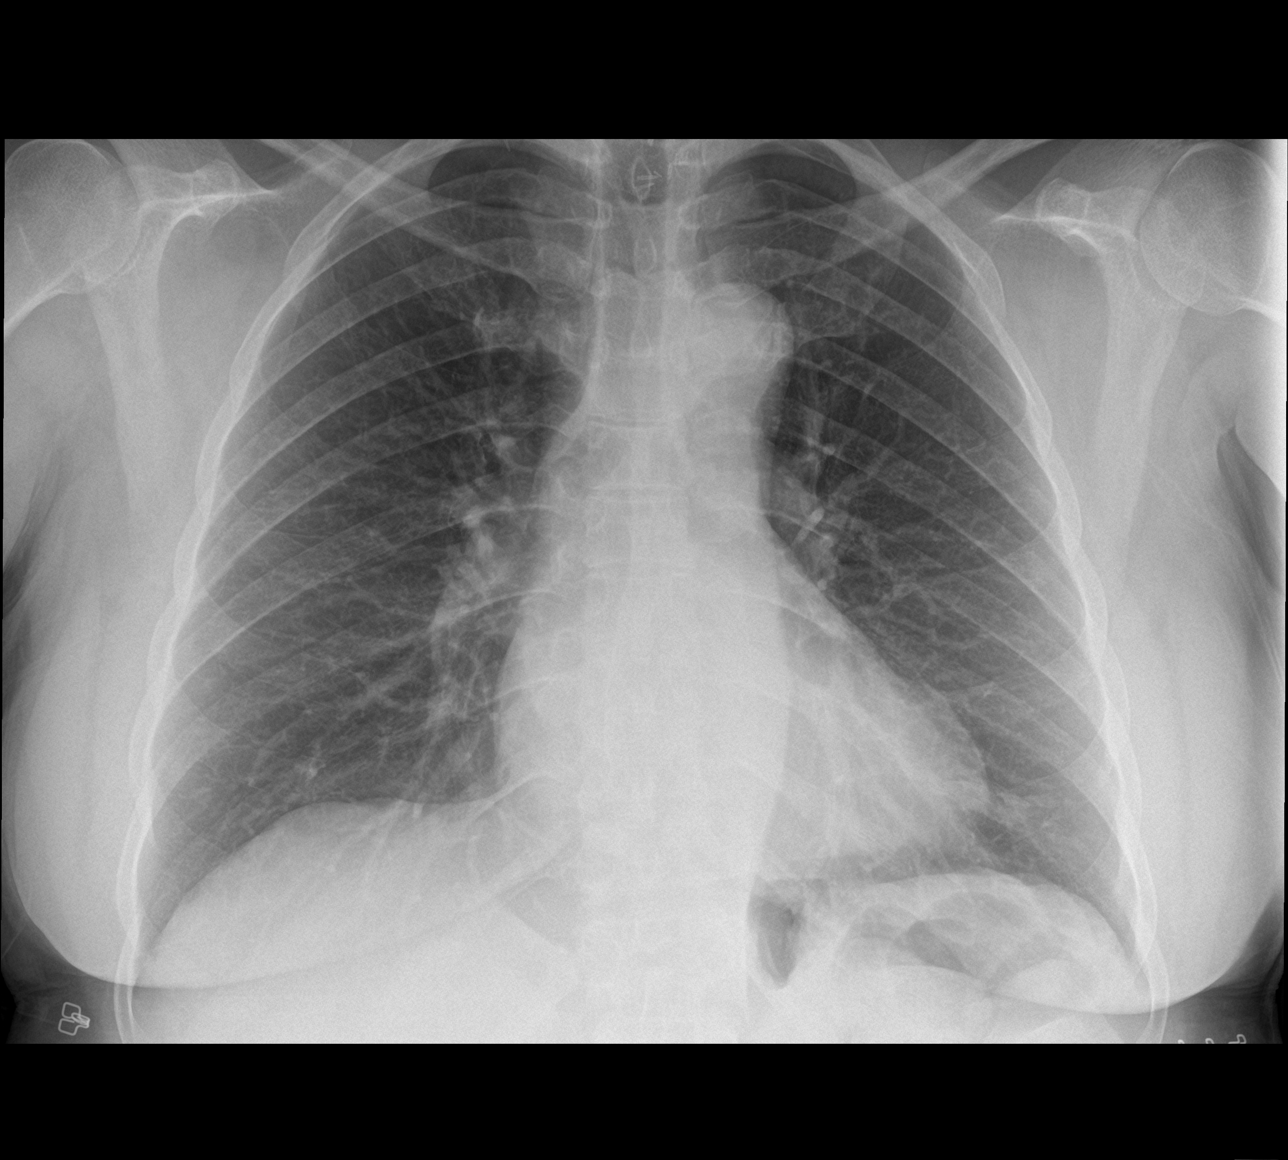

[abdomen erect]
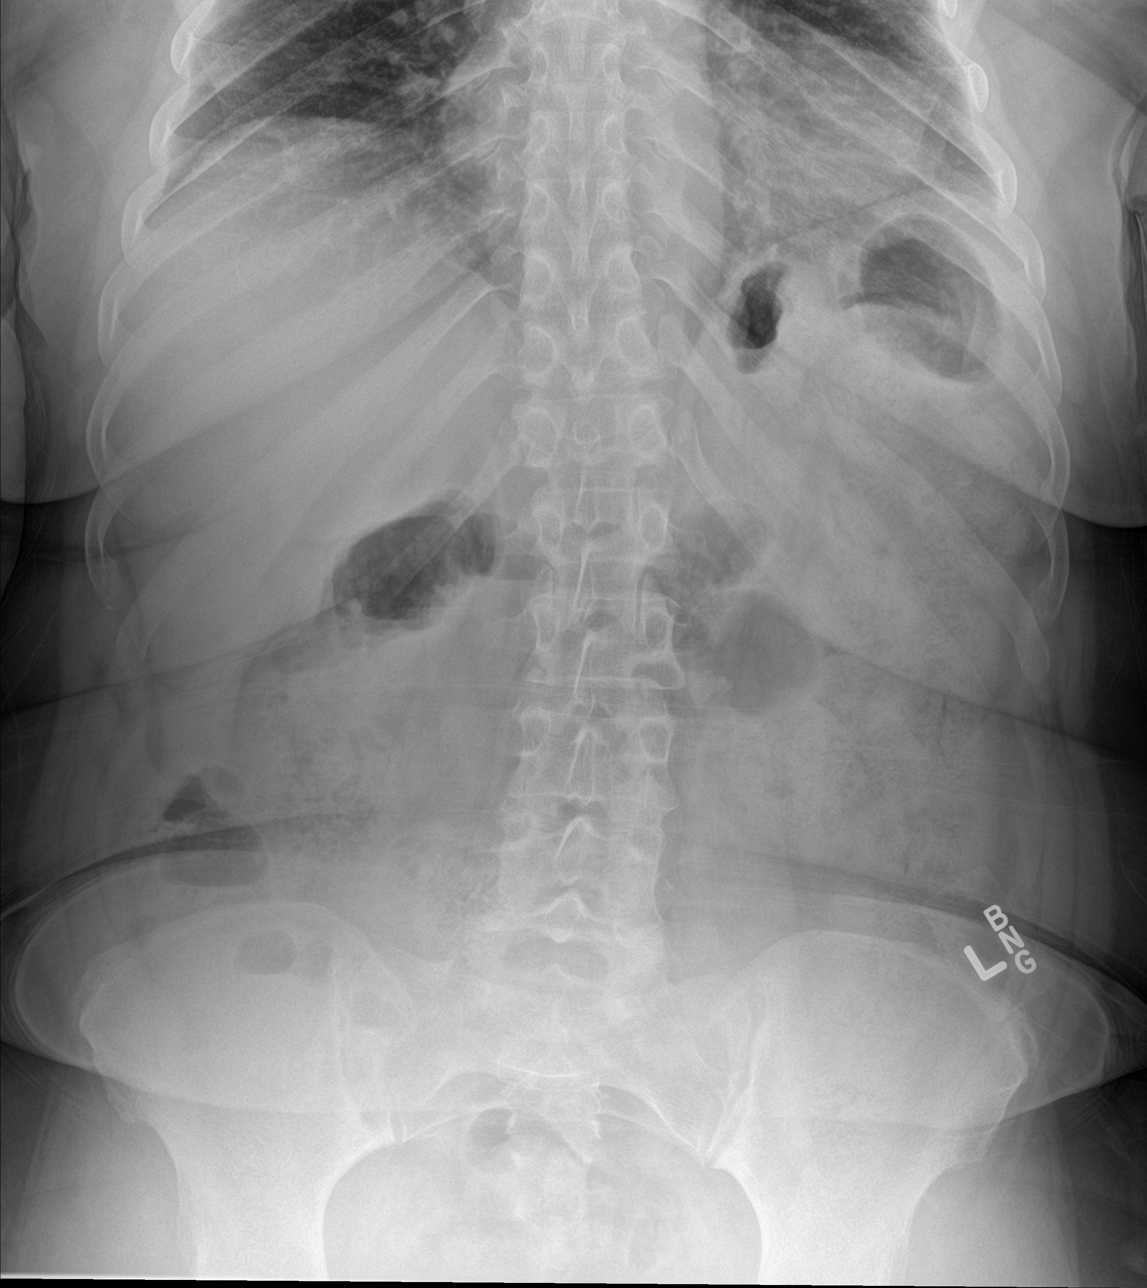

[abdomen supine]
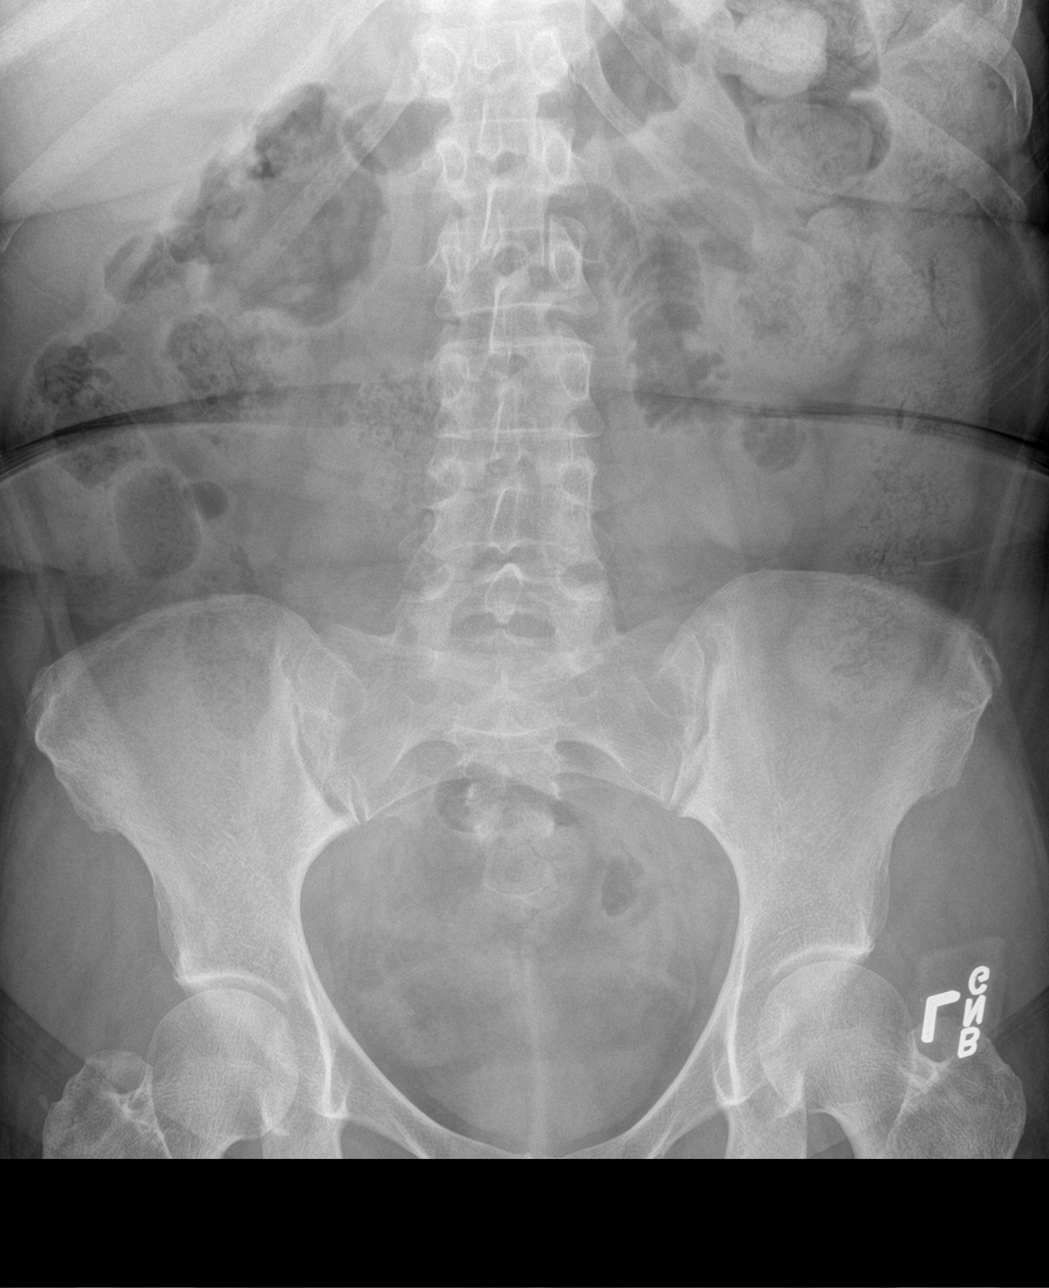

[3 of 3 positions shown; findings below may reference images not displayed]

FINDINGS: Lungs are adequately inflated and otherwise clear. Cardiomediastinal
silhouette and remainder of the chest is unchanged.

Bowel gas pattern is nonobstructive with moderate fecal retention
throughout the colon. Few air-fluid levels over the right colon. No
free peritoneal air. Few air-filled nondilated small bowel loops.
Remaining bones and soft tissues are within normal.
IMPRESSION: Nonobstructive bowel gas pattern with moderate fecal retention
throughout the colon.

No acute cardiopulmonary disease.

## 2015-09-25 MED ORDER — SODIUM CHLORIDE 0.9 % IV BOLUS (SEPSIS)
1000.0000 mL | Freq: Once | INTRAVENOUS | Status: AC
  Administered 2015-09-25: 1000 mL via INTRAVENOUS

## 2015-09-25 MED ORDER — BARIUM SULFATE 2 % PO SUSP: 450.0000 mL | Freq: Once | ORAL | Status: DC

## 2015-09-25 MED ORDER — MAGNESIUM CITRATE PO SOLN
1.0000 | Freq: Once | ORAL | Status: AC
  Administered 2015-09-25: 1 via ORAL
  Filled 2015-09-25: qty 296

## 2015-09-25 MED ORDER — POLYETHYLENE GLYCOL 3350 17 G PO PACK: 17.0000 g | PACK | Freq: Every day | ORAL | Status: DC

## 2015-09-25 MED ORDER — AMOXICILLIN-POT CLAVULANATE 875-125 MG PO TABS: 1.0000 | ORAL_TABLET | Freq: Two times a day (BID) | ORAL | Status: DC

## 2015-09-25 MED ORDER — IOHEXOL 300 MG/ML  SOLN
100.0000 mL | Freq: Once | INTRAMUSCULAR | Status: AC | PRN
  Administered 2015-09-25: 100 mL via INTRAVENOUS

## 2015-09-25 MED ORDER — BARIUM SULFATE 2.1 % PO SUSP
ORAL | Status: AC
  Filled 2015-09-25: qty 2

## 2015-09-25 MED ORDER — POTASSIUM CHLORIDE CRYS ER 20 MEQ PO TBCR
40.0000 meq | EXTENDED_RELEASE_TABLET | Freq: Once | ORAL | Status: AC
  Administered 2015-09-25: 40 meq via ORAL
  Filled 2015-09-25: qty 2

## 2015-09-25 MED ORDER — ONDANSETRON HCL 4 MG/2ML IJ SOLN
4.0000 mg | Freq: Once | INTRAMUSCULAR | Status: AC
  Administered 2015-09-25: 4 mg via INTRAVENOUS
  Filled 2015-09-25: qty 2

## 2015-09-25 MED ORDER — MORPHINE SULFATE (PF) 4 MG/ML IV SOLN
4.0000 mg | Freq: Once | INTRAVENOUS | Status: AC
  Administered 2015-09-25: 4 mg via INTRAVENOUS
  Filled 2015-09-25: qty 1

## 2015-09-25 NOTE — Discharge Instructions (Signed)
Constipation, Adult Use the medication for constipation as prescribed. Follow up with Dr. Darrick PennaFields. Return to the ED if you develop new or worsening symptoms. Constipation is when a person has fewer than three bowel movements a week, has difficulty having a bowel movement, or has stools that are dry, hard, or larger than normal. As people grow older, constipation is more common. A low-fiber diet, not taking in enough fluids, and taking certain medicines may make constipation worse.  CAUSES   Certain medicines, such as antidepressants, pain medicine, iron supplements, antacids, and water pills.   Certain diseases, such as diabetes, irritable bowel syndrome (IBS), thyroid disease, or depression.   Not drinking enough water.   Not eating enough fiber-rich foods.   Stress or travel.   Lack of physical activity or exercise.   Ignoring the urge to have a bowel movement.   Using laxatives too much.  SIGNS AND SYMPTOMS   Having fewer than three bowel movements a week.   Straining to have a bowel movement.   Having stools that are hard, dry, or larger than normal.   Feeling full or bloated.   Pain in the lower abdomen.   Not feeling relief after having a bowel movement.  DIAGNOSIS  Your health care provider will take a medical history and perform a physical exam. Further testing may be done for severe constipation. Some tests may include:  A barium enema X-ray to examine your rectum, colon, and, sometimes, your small intestine.   A sigmoidoscopy to examine your lower colon.   A colonoscopy to examine your entire colon. TREATMENT  Treatment will depend on the severity of your constipation and what is causing it. Some dietary treatments include drinking more fluids and eating more fiber-rich foods. Lifestyle treatments may include regular exercise. If these diet and lifestyle recommendations do not help, your health care provider may recommend taking over-the-counter  laxative medicines to help you have bowel movements. Prescription medicines may be prescribed if over-the-counter medicines do not work.  HOME CARE INSTRUCTIONS   Eat foods that have a lot of fiber, such as fruits, vegetables, whole grains, and beans.  Limit foods high in fat and processed sugars, such as french fries, hamburgers, cookies, candies, and soda.   A fiber supplement may be added to your diet if you cannot get enough fiber from foods.   Drink enough fluids to keep your urine clear or pale yellow.   Exercise regularly or as directed by your health care provider.   Go to the restroom when you have the urge to go. Do not hold it.   Only take over-the-counter or prescription medicines as directed by your health care provider. Do not take other medicines for constipation without talking to your health care provider first.  SEEK IMMEDIATE MEDICAL CARE IF:   You have bright red blood in your stool.   Your constipation lasts for more than 4 days or gets worse.   You have abdominal or rectal pain.   You have thin, pencil-like stools.   You have unexplained weight loss. MAKE SURE YOU:   Understand these instructions.  Will watch your condition.  Will get help right away if you are not doing well or get worse.   This information is not intended to replace advice given to you by your health care provider. Make sure you discuss any questions you have with your health care provider.   Document Released: 07/15/2004 Document Revised: 11/07/2014 Document Reviewed: 07/29/2013 Elsevier Interactive Patient Education 2016  [employer] ° °

## 2015-09-25 NOTE — ED Notes (Signed)
Patient complains of abdominal pain, nausea, vomiting and constipation. States vomited 5 times today. Denies fever. History of diverticulitis.

## 2015-09-25 NOTE — ED Provider Notes (Signed)
CSN: [id number]     Arrival date & time 09/25/15  1801 History  By signing my name below, I, Doreatha Martin, attest that this documentation has been prepared under the direction and in the presence of Glynn Octave, MD. Electronically Signed: Doreatha Martin, ED Scribe. 09/25/2015. 9:15 PM.    Chief Complaint  Patient presents with  . Abdominal Pain   The history is provided by the patient. No language interpreter was used.    HPI Comments: Laura [name] is a 64 y.o. female with h/o GERD, gastritis, HTN, HLD, diverticulitis who presents to the Emergency Department complaining of moderate periumbilical abdominal pain onset yesterday with associated constipation, 5 episodes of emesis today. Pt states no exacerbating or alleviating factors of abdominal pain. No h/o similar abdominal pain. She states her current pain is not similar to GERD. Last BM this morning after taking a suppository was hard. She states she occasionally takes a suppository at baseline to aid her bowel movements. She states she normally has a BM every other day. She states her last colonoscopy was normal. H/o hysterectomy, hiatal umbilical repair. No h/o appendectomy, cholecystectomy. Pt takes daily medication for GERD and Linzess for diverticulitis.  PCP is Dr. Lodema Hong. Pt states she has been hospitalized for diverticulitis. She denies back pain, CP, hematuria, dysuria, fever, flatulence, melena, hematochezia, rectal pain.  Past Medical History  Diagnosis Date  . GERD (gastroesophageal reflux disease)   . Gastritis   . Hypertension   . Hyperlipidemia   . Anemia   . Cystitis   . Osteopenia   . Shoulder pain   . Hypothyroidism   . Hashimoto's thyroiditis     Hx   . Urinary incontinence   . Anxiety   . Depression    Past Surgical History  Procedure Laterality Date  . Total abdominal hysterectomy  1994  . Umbilical hernia repair    . Breast excisional biopsy  2010    Excisional biopsy of benign left breast mass -lopoma    . Resection of left lobe of thyroid    . Bilateral tubal ligation    . Right carpal tunnel release    . Urethral dilation for stenosis  2009  . Rt. knee athroscopy  2004  . Colonscopy  2005    Dr. Katrinka Blazing  . Esophagogastroduodenoscopy  12/2009    chronic gastritis  . Breast surgery      left nreast biopsy  . Colonoscopy  08/26/2011    SLF: 1. Internal hemorrhoids   Family History  Problem Relation Age of Onset  . Colon cancer Neg Hx   . Anesthesia problems Neg Hx   . Hypotension Neg Hx   . Malignant hyperthermia Neg Hx   . Pseudochol deficiency Neg Hx    Social History  Substance Use Topics  . Smoking status: Former Smoker -- 0.50 packs/day    Types: Cigarettes    Quit date: 08/25/1972  . Smokeless tobacco: None  . Alcohol Use: No   OB History    No data available     Review of Systems A complete 10 system review of systems was obtained and all systems are negative except as noted in the HPI and PMH.    Allergies  Dilaudid; Levaquin; and Nsaids  Home Medications   Prior to Admission medications   Medication Sig Start Date End Date Taking? Authorizing Provider  Biotin 1000 MCG tablet Take 1,000 mcg by mouth daily.   Yes Historical Provider, MD  citalopram (CELEXA) 10 MG tablet  Take 1 tablet (10 mg total) by mouth daily. 09/03/15  Yes Kerri PerchesMargaret E Simpson, MD  hydrochlorothiazide (HYDRODIURIL) 25 MG tablet TAKE 1 TABLET BY MOUTH EVERY DAY 04/07/15  Yes Kerri PerchesMargaret E Simpson, MD  imipramine (TOFRANIL) 25 MG tablet Take 1 tablet (25 mg total) by mouth at bedtime. 09/23/14  Yes Kerri PerchesMargaret E Simpson, MD  levothyroxine (SYNTHROID, LEVOTHROID) 100 MCG tablet Take 1 tablet (100 mcg total) by mouth daily before breakfast. 08/27/15  Yes Roma KayserGebreselassie W Nida, MD  Linaclotide (LINZESS) 290 MCG CAPS capsule Take 1 capsule (290 mcg total) by mouth daily. WITH BREAKFAST. IT MAY CAUSE DIARRHEA. 09/16/15  Yes West BaliSandi L Fields, MD  losartan (COZAAR) 25 MG tablet Take 1 tablet (25 mg total) by mouth  daily. 09/03/15  Yes Kerri PerchesMargaret E Simpson, MD  lovastatin (MEVACOR) 40 MG tablet TAKE 2 TABLETS BY MOUTH EVERY NIGHT AT BEDTIME FOR CHOLESTEROL 09/23/14  Yes Kerri PerchesMargaret E Simpson, MD  Multiple Vitamins-Minerals (CENTRUM ADULTS PO) Take 1 tablet by mouth daily.   Yes Historical Provider, MD  Nitroglycerin 0.4 % OINT PLACE A SMALL AMOUNT ON Q TIP AND INSERT IN ANAL CANAL QID FOR 3 MOS. Patient taking differently: Place 1 application rectally 4 (four) times daily. PLACE A SMALL AMOUNT ON Q TIP AND INSERT IN ANAL CANAL  4 TIMES DAILY FOR 3 MOS. 09/16/15  Yes West BaliSandi L Fields, MD  pantoprazole (PROTONIX) 40 MG tablet TAKE 1 TABLET BY MOUTH TWICE DAILY 09/03/15  Yes Kerri PerchesMargaret E Simpson, MD  potassium chloride (K-DUR,KLOR-CON) 10 MEQ tablet Take 1 tablet (10 mEq total) by mouth 2 (two) times daily. 12/29/14  Yes Kerri PerchesMargaret E Simpson, MD  acetic acid-hydrocortisone (VOSOL-HC) otic solution Three drops to affected ear(s) three times daily for 5 days, then as needed Patient not taking: Reported on 09/16/2015 09/09/15   Kerri PerchesMargaret E Simpson, MD  amoxicillin-clavulanate (AUGMENTIN) 875-125 MG tablet Take 1 tablet by mouth every 12 (twelve) hours. 09/25/15   Glynn OctaveStephen Temperance Kelemen, MD  Na Sulfate-K Sulfate-Mg Sulf SOLN Take 1 Container by mouth once. Patient not taking: Reported on 09/25/2015 09/16/15 10/16/15  West BaliSandi L Fields, MD  polyethylene glycol [facility](MIRALAX) packet Take 17 g by mouth daily. 09/25/15   Glynn OctaveStephen Torrence Hammack, MD   BP 124/85 mmHg  Pulse 81  Temp(Src) 98.7 F (37.1 C) (Oral)  Resp 20  Ht 5\' 4"  (1.626 m)  Wt 186 lb (84.369 kg)  BMI 31.91 kg/m2  SpO2 98% Physical Exam  Constitutional: She is oriented to person, place, and time. She appears well-developed and well-nourished. No distress.  HENT:  Head: Normocephalic and atraumatic.  Mouth/Throat: Oropharynx is clear and moist. No oropharyngeal exudate.  Eyes: Conjunctivae and EOM are normal. Pupils are equal, round, and reactive to light.  Neck: Normal range of  motion. Neck supple.  No meningismus.  Cardiovascular: Normal rate, regular rhythm, normal heart sounds and intact distal pulses.   No murmur heard. Pulmonary/Chest: Effort normal and breath sounds normal. No respiratory distress.  Abdominal: Soft. There is tenderness. There is no rebound and no guarding.  Epigastric and LLQ tenderness with no guarding or rebound.   Genitourinary:  Chaperone present throughout entire exam. No masses palpated. No hemorrhoids, no fissures, no fecal impaction.   Musculoskeletal: Normal range of motion. She exhibits no edema or tenderness.  Neurological: She is alert and oriented to person, place, and time. No cranial nerve deficit. She exhibits normal muscle tone. Coordination normal.  No ataxia on finger to nose bilaterally. No pronator drift. 5/5 strength throughout. CN 2-12  intact.Equal grip strength. Sensation intact.   Skin: Skin is warm.  Psychiatric: She has a normal mood and affect. Her behavior is normal.  Nursing note and vitals reviewed.  ED Course  Procedures (including critical care time) DIAGNOSTIC STUDIES: Oxygen Saturation is 100% on RA, normal by my interpretation.    COORDINATION OF CARE: 9:11 PM Discussed treatment plan with pt at bedside and pt agreed to plan.   Labs Review Labs Reviewed  CBC WITH DIFFERENTIAL/PLATELET - Abnormal; Notable for the following:    WBC 11.1 (*)    Hemoglobin 11.1 (*)    HCT 33.9 (*)    Neutro Abs 8.6 (*)    All other components within normal limits  COMPREHENSIVE METABOLIC PANEL - Abnormal; Notable for the following:    Potassium 2.9 (*)    Chloride 96 (*)    Glucose, Bld 103 (*)    All other components within normal limits  LIPASE, BLOOD  TROPONIN I  URINALYSIS, ROUTINE W REFLEX MICROSCOPIC (NOT AT [facility])  POC OCCULT BLOOD, ED    Imaging Review Ct Abdomen Pelvis W Contrast  09/25/2015  CLINICAL DATA:  Abdominal pain, nausea, vomiting, constipation, history of diverticulitis EXAM: CT ABDOMEN  AND PELVIS WITH CONTRAST TECHNIQUE: Multidetector CT imaging of the abdomen and pelvis was performed using the standard protocol following bolus administration of intravenous contrast. CONTRAST:  OMNIPAQUE IOHEXOL 300 MG/ML  SOLN COMPARISON:  CT scan 3/ 4/13 FINDINGS: The lung bases are unremarkable. Sagittal images of the spine are unremarkable. Scattered hepatic cysts are stable from prior exam. The pancreas, spleen and adrenal glands are unremarkable. No aortic aneurysm. Kidneys are symmetrical in size and enhancement. No hydronephrosis or hydroureter. Delayed renal images shows bilateral renal symmetrical excretion. Bilateral visualized proximal ureter is unremarkable. No small bowel obstruction.  No ascites or free air. Abundant stool noted in right colon and transverse colon. There is no pericecal inflammation. Some fecal like material noted within terminal ileum suspicious for incompetent ileocecal valve. The appendix is not identified. Moderate stool noted in descending colon. Axial image 44 there is mild thickening of colonic wall in descending colon and mild stranding of surrounding fat. Mild focal colitis is suspected. The sigmoid colon is empty partially collapsed. The urinary bladder is unremarkable. The patient is status post hysterectomy. IMPRESSION: 1. Abundant stool noted in right colon and transverse colon. Moderate stool noted in descending colon. Subtle mild stranding of pericolonic fat in left colon. Mild thickening of colonic wall in distal left colon axial image 46 and 44. Mild stercoral colitis cannot be excluded. 2. No small bowel obstruction. 3. Stable hepatic cysts. 4. No hydronephrosis or hydroureter. 5. Status post hysterectomy. Electronically Signed   By: Natasha Mead M.D.   On: 09/25/2015 23:02   Dg Abd Acute W/chest  09/25/2015  CLINICAL DATA:  Abdominal pain, nausea, vomiting and constipation today. EXAM: DG ABDOMEN ACUTE W/ 1V CHEST COMPARISON:  Chest x-ray 07/21/2014 and  lumbar spine 04/14/2014 FINDINGS: Lungs are adequately inflated and otherwise clear. Cardiomediastinal silhouette and remainder of the chest is unchanged. Bowel gas pattern is nonobstructive with moderate fecal retention throughout the colon. Few air-fluid levels over the right colon. No free peritoneal air. Few air-filled nondilated small bowel loops. Remaining bones and soft tissues are within normal. IMPRESSION: Nonobstructive bowel gas pattern with moderate fecal retention throughout the colon. No acute cardiopulmonary disease. Electronically Signed   By: Elberta Fortis M.D.   On: 09/25/2015 18:58   I have personally reviewed and evaluated  these images and lab results as part of my medical decision-making.  MDM   Final diagnoses:  Constipation, unspecified constipation type  Abdominal pain, unspecified abdominal location   patient with abdominal pain and nausea and vomiting and constipation since yesterday. No fever.  Abdomen soft without peritoneal signs. No fecal impaction on exam.  Labs show mild hypokalemia which was repleted. CT scan shows extensive constipation without obstruction. Questionable small area of colitis.  No vomiting in the ED.  Will treat constipation with magnesium citrate, miralax.  Treat for possible colitis. Follow up with Dr. Darrick Penna.  Return precautions discussed.  I personally performed the services described in this documentation, which was scribed in my presence. The recorded information has been reviewed and is accurate.   Glynn Octave, MD 09/26/15 [phone]

## 2015-09-25 NOTE — ED Notes (Signed)
Patient verbalizes understanding of discharge instructions, prescription medications, home care and follow up care. Patient ambulatory out of department at this time with family. 

## 2015-09-28 DIAGNOSIS — M5441 Lumbago with sciatica, right side: Secondary | ICD-10-CM | POA: Diagnosis not present

## 2015-09-28 DIAGNOSIS — M5442 Lumbago with sciatica, left side: Secondary | ICD-10-CM | POA: Diagnosis not present

## 2015-10-01 DIAGNOSIS — M5441 Lumbago with sciatica, right side: Secondary | ICD-10-CM | POA: Diagnosis not present

## 2015-10-01 DIAGNOSIS — M5442 Lumbago with sciatica, left side: Secondary | ICD-10-CM | POA: Diagnosis not present

## 2015-10-06 ENCOUNTER — Ambulatory Visit (HOSPITAL_COMMUNITY)
Admission: RE | Admit: 2015-10-06 | Discharge: 2015-10-06 | Disposition: A | Discharge status: Home / Self Care | Payer: Medicare Other | Source: Ambulatory Visit | Attending: Gastroenterology | Admitting: Gastroenterology

## 2015-10-06 ENCOUNTER — Encounter (HOSPITAL_COMMUNITY)
Admission: RE | Disposition: A | Discharge status: Home / Self Care | Payer: Self-pay | Source: Ambulatory Visit | Attending: Gastroenterology

## 2015-10-06 DIAGNOSIS — E785 Hyperlipidemia, unspecified: Secondary | ICD-10-CM | POA: Insufficient documentation

## 2015-10-06 DIAGNOSIS — Z79899 Other long term (current) drug therapy: Secondary | ICD-10-CM | POA: Diagnosis not present

## 2015-10-06 DIAGNOSIS — K6289 Other specified diseases of anus and rectum: Secondary | ICD-10-CM | POA: Insufficient documentation

## 2015-10-06 DIAGNOSIS — K602 Anal fissure, unspecified: Secondary | ICD-10-CM | POA: Insufficient documentation

## 2015-10-06 DIAGNOSIS — I10 Essential (primary) hypertension: Secondary | ICD-10-CM | POA: Diagnosis not present

## 2015-10-06 DIAGNOSIS — F329 Major depressive disorder, single episode, unspecified: Secondary | ICD-10-CM | POA: Insufficient documentation

## 2015-10-06 DIAGNOSIS — E039 Hypothyroidism, unspecified: Secondary | ICD-10-CM | POA: Insufficient documentation

## 2015-10-06 DIAGNOSIS — F419 Anxiety disorder, unspecified: Secondary | ICD-10-CM | POA: Insufficient documentation

## 2015-10-06 DIAGNOSIS — K219 Gastro-esophageal reflux disease without esophagitis: Secondary | ICD-10-CM | POA: Insufficient documentation

## 2015-10-06 DIAGNOSIS — K625 Hemorrhage of anus and rectum: Secondary | ICD-10-CM | POA: Diagnosis not present

## 2015-10-06 DIAGNOSIS — K648 Other hemorrhoids: Secondary | ICD-10-CM | POA: Insufficient documentation

## 2015-10-06 DIAGNOSIS — R194 Change in bowel habit: Secondary | ICD-10-CM

## 2015-10-06 DIAGNOSIS — K529 Noninfective gastroenteritis and colitis, unspecified: Secondary | ICD-10-CM | POA: Diagnosis not present

## 2015-10-06 HISTORY — PX: COLONOSCOPY: SHX5424

## 2015-10-06 SURGERY — COLONOSCOPY
Anesthesia: Moderate Sedation

## 2015-10-06 MED ORDER — SODIUM CHLORIDE 0.9 % IV SOLN
INTRAVENOUS | Status: DC
  Administered 2015-10-06: 1000 mL via INTRAVENOUS

## 2015-10-06 MED ORDER — STERILE WATER FOR IRRIGATION IR SOLN
Status: DC | PRN
  Administered 2015-10-06: 14:00:00

## 2015-10-06 MED ORDER — MIDAZOLAM HCL 5 MG/5ML IJ SOLN
INTRAMUSCULAR | Status: AC
  Filled 2015-10-06: qty 10

## 2015-10-06 MED ORDER — MIDAZOLAM HCL 5 MG/5ML IJ SOLN
INTRAMUSCULAR | Status: DC | PRN
  Administered 2015-10-06: 2 mg via INTRAVENOUS
  Administered 2015-10-06: 1 mg via INTRAVENOUS
  Administered 2015-10-06: 2 mg via INTRAVENOUS
  Administered 2015-10-06: 1 mg via INTRAVENOUS

## 2015-10-06 MED ORDER — MEPERIDINE HCL 100 MG/ML IJ SOLN
INTRAMUSCULAR | Status: DC | PRN
  Administered 2015-10-06 (×4): 25 mg via INTRAVENOUS

## 2015-10-06 MED ORDER — MEPERIDINE HCL 100 MG/ML IJ SOLN
INTRAMUSCULAR | Status: AC
  Filled 2015-10-06: qty 2

## 2015-10-06 NOTE — Op Note (Addendum)
[facility] [address] [place], [id number]   COLONOSCOPY PROCEDURE REPORT  PATIENT: Laura [name], [name]  MR#: [id number] BIRTHDATE: [birth year] , 64  yrs. old GENDER: female ENDOSCOPIST: West BaliSandi L Fields, MD REFERRED XB:[id number]:Margaret Lodema HongSimpson, M.D. PROCEDURE DATE:  10/06/2015 PROCEDURE:   Colonoscopy with biopsy INDICATIONS:change in bowel habits. MEDICATIONS: Demerol 100 mg IV and Versed 6 mg IV  DESCRIPTION OF PROCEDURE:    Physical exam was performed.  Informed consent was obtained from the patient after explaining the benefits, risks, and alternatives to procedure.  The patient was connected to monitor and placed in left lateral position. Continuous oxygen was provided by nasal cannula and IV medicine administered through an indwelling cannula.  After administration of sedation and rectal exam, the patients rectum was intubated and the EC-[id number] ([id number]  colonoscope was advanced under direct visualization to the ileum.  The scope was removed slowly by carefully examining the color, texture, anatomy, and integrity mucosa on the way out.  The patient was recovered in endoscopy and discharged home in satisfactory condition. Estimated blood loss is zero unless otherwise noted in this procedure report.    COLON FINDINGS: The examined terminal ileum appeared to be normal. , Two non-bleeding and linear ulcers ranging between 3-627mm in size with surrounding edema were found in the sigmoid colon.  Biopsies were taken around the ulcers.  , The colon was redundant.  Manual abdominal counter-pressure was used to reach the cecum, and Small internal hemorrhoids were found.  PREP QUALITY: good. CECAL W/D TIME: 14       minutes COMPLICATIONS: None  ENDOSCOPIC IMPRESSION: 1.   No source for change in bowel habits identified and MOST LIKELY DUE TO IBS-C 2.   MILD SIGMOID COLITIS 3. RECTAL BLEEDING DUE TO ANAL FISSURE AND/OR INTERNAL HEMORHROIDS  RECOMMENDATIONS: DRINK WATER TO  KEEP URINE LIGHT YELLOW. FOLLOW A HIGH FIBER DIET. USE COLACE ONE PILL 2 OR 3 TIMES A DAY FOR 1 TO 3 MONTHS. TAKE LINZESS WITH FOOD. CONTINUE RECTIV FOR ONE TO TWO MONTHS. AWAIT BIOPSY RESULTS. FOLLOW UP IN 4 MOS. NEXT TCS IN 10 YEARS WITH AN OVERTUBE  _____________________________ eSignedWest Bali:  Sandi L Fields, MD 10/07/2015 4:14 PM Revised: 10/07/2015 4:14 PM  CPT CODES: ICD CODES:  The ICD and CPT codes recommended by this software are interpretations from the data that the clinical staff has captured with the software.  The verification of the translation of this report to the ICD and CPT codes and modifiers is the sole responsibility of the health care institution and practicing physician where this report was generated.  [employer] will not be held responsible for the validity of the ICD and CPT codes included on this report.  AMA assumes no liability for data contained or not contained herein. CPT is a Publishing rights managerregistered trademark of the [employer].

## 2015-10-06 NOTE — H&P (View-Only) (Signed)
Subjective:    Patient ID: Laura [name], female    DOB: 01-01-1951, 64 y.o.   MRN: [id number]  Laura OvermanMargaret Simpson, MD  HPI Change in bowel habits since OCT 2016. WAS HAVING A BM-DAILY WITH LINZESS. AND NOW SCARED TO HAVE A BM SINCE OCT 2016 BECAISE IT HURTS. INITALLY HAD TROUBLE PASSING STOOL AND EVER SINCE THEN HAS HAD SEVERE RECTAL PAIN WHEN STOOL PASSES. NO RECTAL PRESSURE, ITCHING, BURNING, OR SOILING. SAW BLOOD 3 TIMES. LAST TIME LAST PART OCT 2016. NO CHANGE IN MEDS OR NEW OTC SUPPLEMENTS. NAUSEA: 1-2X/MO IN THE AM. WHEN SHE FEELS URGE TO HAVE A BM, HAS ABDOMINAL CRAMPS AND IT GETS BETTER AFTER A BM. LAST TCS OCT 2012 WITH LESS THAN IDEAL PREP. HAD SYNTHROID DOSE REDUCED OCT 11 BUT SYMPTOMS STARTED GETTING WORSE BEFORE.  PT DENIES FEVER, CHILLS, HEMATOCHEZIA,  vomiting, melena, diarrhea, CHEST PAIN, SHORTNESS OF BREATH,  problems swallowing, problems with sedation, OR  heartburn or indigestion.  Past Medical History  Diagnosis Date  . GERD (gastroesophageal reflux disease)   . Gastritis   . Hypertension   . Hyperlipidemia   . Anemia   . Cystitis   . Osteopenia   . Shoulder pain   . Hypothyroidism   . Hashimoto's thyroiditis     Hx   . Urinary incontinence   . Anxiety   . Depression     Past Surgical History  Procedure Laterality Date  . Total abdominal hysterectomy  1994  . Umbilical hernia repair    . Breast excisional biopsy  2010    Excisional biopsy of benign left breast mass -lopoma   . Resection of left lobe of thyroid    . Bilateral tubal ligation    . Right carpal tunnel release    . Urethral dilation for stenosis  2009  . Rt. knee athroscopy  2004  . Colonscopy  2005    Dr. Katrinka BlazingSmith  . Esophagogastroduodenoscopy  12/2009    chronic gastritis  . Breast surgery      left nreast biopsy  . Colonoscopy  08/26/2011    SLF: 1. Internal hemorrhoids    Allergies  Allergen Reactions  . Dilaudid [Hydromorphone Hcl]   . Levaquin [Levofloxacin] Other (See Comments)     Dry throat, no raste  . Nsaids     REACTION: per GI pt no longer to use NSAIDS    Current Outpatient Prescriptions  Medication Sig Dispense Refill  . BIOTIN PO Take by mouth.    . citalopram (CELEXA) 10 MG tablet Take 1 tablet (10 mg total) by mouth daily.    . hydrochlorothiazide 25 MG tablet TAKE 1 TABLET BY MOUTH EVERY DAY    . imipramine (TOFRANIL) 25 MG tablet Take 1 tablet (25 mg total) by mouth at bedtime.    Marland Kitchen. SYNTHROID 100 MCG tablet Take 1 tablet daily before breakfast.    . Linaclotide (LINZESS) 145 MCG CAPS capsule 1 PO 30 mins prior to your first meal    . losartan (COZAAR) 25 MG tablet Take 1 tablet (25 mg total) by mouth daily.    Marland Kitchen. lovastatin (MEVACOR) 40 MG tablet TAKE 2 TABLETS BY MOUTH EVERY NIGHT AT BEDTIME FOR CHOLESTEROL    . lovastatin (MEVACOR) 40 MG tablet TAKE 2 TABLETS BY MOUTH EVERY NIGHT AT BEDTIME FOR CHOLESTEROL    . Multiple Vitamins-Minerals (CENTRUM ADULTS PO) Take 1 tablet by mouth daily.    . pantoprazole (PROTONIX) 40 MG tablet TAKE 1 TABLET BY MOUTH TWICE DAILY    .  potassium chloride (K-DUR,KLOR-CON) 10 MEQ tablet Take 1 tablet (10 mEq total) by mouth 2 (two) times daily.    Marland Kitchen acetic acid-hydrocortisone (VOSOL-HC) otic solution Three drops to affected ear(s) three times daily for 5 days, then as needed (Patient not taking: Reported on 09/16/2015)     Review of Systems PER HPI OTHERWISE ALL SYSTEMS ARE NEGATIVE.    Objective:   Physical Exam  Constitutional: She is oriented to person, place, and time. She appears well-developed and well-nourished. No distress.  HENT:  Head: Normocephalic and atraumatic.  Mouth/Throat: Oropharynx is clear and moist. No oropharyngeal exudate.  Eyes: Pupils are equal, round, and reactive to light. No scleral icterus.  Neck: Normal range of motion. Neck supple.  Cardiovascular: Normal rate, regular rhythm and normal heart sounds.   Pulmonary/Chest: Effort normal and breath sounds normal. No respiratory distress.    Abdominal: Soft. Bowel sounds are normal. She exhibits no distension.  Musculoskeletal: She exhibits no edema.  Lymphadenopathy:    She has no cervical adenopathy.  Neurological: She is alert and oriented to person, place, and time.  NO FOCAL DEFICITS   Psychiatric:  ANXIOUS MOOD, nl affect  Vitals reviewed.         Assessment & Plan:

## 2015-10-06 NOTE — Interval H&P Note (Signed)
History and Physical Interval Note:  10/06/2015 2:17 PM  [name]  has presented today for surgery, with the diagnosis of rectal bleeding, change in bowel habits  The various methods of treatment have been discussed with the patient and family. After consideration of risks, benefits and other options for treatment, the patient has consented to  Procedure(s) with comments: COLONOSCOPY (N/A) - 1315  as a surgical intervention .  The patient's history has been reviewed, patient examined, no change in status, stable for surgery.  I have reviewed the patient's chart and labs.  Questions were answered to the patient's satisfaction.     Eaton CorporationSandi Fields

## 2015-10-06 NOTE — Discharge Instructions (Signed)
You have internal hemorrhoids. YOU DID NOT HAVE ANY POLYPS.  YOU HAVE mild sigmoid colitis. I biopsied your colon.  DRINK WATER TO KEEP YOUR URINE LIGHT YELLOW.  FOLLOW A HIGH FIBER DIET. SEE INFO BELOW.  USE COLACE ONE PILL 2 OR 3 TIMES A DAY FOR 1 TO 3 MONTHS.  TAKE LINZESS WITH FOOD.   CONTINUE RECTIV FOR ONE TO TWO MONTHS.   YOUR BIOPSY RESULTS WILL BE AVAILABLE IN MY CHART DEC 9 AND MY OFFICE WILL CONTACT YOU IN 10-14 DAYS WITH YOUR RESULTS.   FOLLOW UP IN 4 MOS.    Colonoscopy Care After Read the instructions outlined below and refer to this sheet in the next week. These discharge instructions provide you with general information on caring for yourself after you leave the hospital. While your treatment has been planned according to the most current medical practices available, unavoidable complications occasionally occur. If you have any problems or questions after discharge, call DR. Rivaldo Hineman, [phone].  ACTIVITY  You may resume your regular activity, but move at a slower pace for the next 24 hours.   Take frequent rest periods for the next 24 hours.   Walking will help get rid of the air and reduce the bloated feeling in your belly (abdomen).   No driving for 24 hours (because of the medicine (anesthesia) used during the test).   You may shower.   Do not sign any important legal documents or operate any machinery for 24 hours (because of the anesthesia used during the test).    NUTRITION  Drink plenty of fluids.   You may resume your normal diet as instructed by your doctor.   Begin with a light meal and progress to your normal diet. Heavy or fried foods are harder to digest and may make you feel sick to your stomach (nauseated).   Avoid alcoholic beverages for 24 hours or as instructed.    MEDICATIONS  You may resume your normal medications.   WHAT YOU CAN EXPECT TODAY  Some feelings of bloating in the abdomen.   Passage of more gas than usual.    Spotting of blood in your stool or on the toilet paper  .  IF YOU HAD POLYPS REMOVED DURING THE COLONOSCOPY:  Eat a soft diet IF YOU HAVE NAUSEA, BLOATING, ABDOMINAL PAIN, OR VOMITING.    FINDING OUT THE RESULTS OF YOUR TEST Not all test results are available during your visit. DR. Darrick PennaFIELDS WILL CALL YOU WITHIN 14 DAYS OF YOUR PROCEDUE WITH YOUR RESULTS. Do not assume everything is normal if you have not heard from DR. Jashley Yellin, CALL HER OFFICE AT [phone].  SEEK IMMEDIATE MEDICAL ATTENTION AND CALL THE OFFICE: [phone] IF:  You have more than a spotting of blood in your stool.   Your belly is swollen (abdominal distention).   You are nauseated or vomiting.   You have a temperature over 101F.   You have abdominal pain or discomfort that is severe or gets worse throughout the day.  Constipation in Adults Constipation is having fewer than 2 bowel movements per week. Usually, the stools are hard. As we grow older, constipation is more common. If you try to fix constipation with laxatives, the problem may get worse. This is because laxatives taken over a long period of time make the colon muscles weaker. A low-fiber diet, not taking in enough fluids, HAVING VAGINAL DELIVERIES, and taking some medicines may make these problems worse.  HOME CARE INSTRUCTIONS  Constipation is usually best  cared for without medicines. Increasing dietary fiber and eating more fruits and vegetables is the best way to manage constipation.   Slowly increase fiber intake to 25 to 38 grams per day. Whole grains, fruits, vegetables, and legumes are good sources of fiber. A dietitian can further help you incorporate high-fiber foods into your diet.   Drink enough water and fluids to keep your urine clear or pale yellow.   A fiber supplement may be added to your diet if you cannot get enough fiber from foods.   Increasing your activities also helps improve regularity.   High-Fiber Diet A high-fiber diet  changes your normal diet to include more whole grains, legumes, fruits, and vegetables. Changes in the diet involve replacing refined carbohydrates with unrefined foods. The calorie level of the diet is essentially unchanged. The Dietary Reference Intake (recommended amount) for adult males is 38 grams per day. For adult females, it is 25 grams per day. Pregnant and lactating women should consume 28 grams of fiber per day.    Fiber is the intact part of a plant that is not broken down during digestion. Functional fiber is fiber that has been isolated from the plant to provide a beneficial effect in the body.  PURPOSE  Increase stool bulk.   Ease and regulate bowel movements.   Lower cholesterol.  REDUCE RISK OF COLON CANCER  INDICATIONS THAT YOU NEED MORE FIBER  Constipation and hemorrhoids.   Uncomplicated diverticulosis (intestine condition) and irritable bowel syndrome.   Weight management.   As a protective measure against hardening of the arteries (atherosclerosis), diabetes, and cancer.   GUIDELINES FOR INCREASING FIBER IN THE DIET  Start adding fiber to the diet slowly. A gradual increase of about 5 more grams (2 slices of whole-wheat bread, 2 servings of most fruits or vegetables, or 1 bowl of high-fiber cereal) per day is best. Too rapid an increase in fiber may result in constipation, flatulence, and bloating.   Drink enough water and fluids to keep your urine clear or pale yellow. Water, juice, or caffeine-free drinks are recommended. Not drinking enough fluid may cause constipation.   Eat a variety of high-fiber foods rather than one type of fiber.   Try to increase your intake of fiber through using high-fiber foods rather than fiber pills or supplements that contain small amounts of fiber.   The goal is to change the types of food eaten. Do not supplement your present diet with high-fiber foods, but replace foods in your present diet.   INCLUDE A VARIETY OF FIBER  SOURCES  Replace refined and processed grains with whole grains, canned fruits with fresh fruits, and incorporate other fiber sources. White rice, white breads, and most bakery goods contain little or no fiber.   Brown whole-grain rice, buckwheat oats, and many fruits and vegetables are all good sources of fiber. These include: broccoli, Brussels sprouts, cabbage, cauliflower, beets, sweet potatoes, white potatoes (skin on), carrots, tomatoes, eggplant, squash, berries, fresh fruits, and dried fruits.   Cereals appear to be the richest source of fiber. Cereal fiber is found in whole grains and bran. Bran is the fiber-rich outer coat of cereal grain, which is largely removed in refining. In whole-grain cereals, the bran remains. In breakfast cereals, the largest amount of fiber is found in those with "bran" in their names. The fiber content is sometimes indicated on the label.   You may need to include additional fruits and vegetables each day.   In baking, for 1  cup white flour, you may use the following substitutions:   1 cup whole-wheat flour minus 2 tablespoons.   1/2 cup white flour plus 1/2 cup whole-wheat flour.

## 2015-10-08 ENCOUNTER — Encounter (HOSPITAL_COMMUNITY): Payer: Self-pay | Admitting: Gastroenterology

## 2015-10-13 DIAGNOSIS — M5442 Lumbago with sciatica, left side: Secondary | ICD-10-CM | POA: Diagnosis not present

## 2015-10-13 DIAGNOSIS — M25562 Pain in left knee: Secondary | ICD-10-CM | POA: Diagnosis not present

## 2015-10-21 ENCOUNTER — Telehealth: Payer: Self-pay | Admitting: Gastroenterology

## 2015-10-21 NOTE — Telephone Encounter (Signed)
Reminder in epic °

## 2015-10-21 NOTE — Telephone Encounter (Signed)
Please call pt. She had a BENIGN lesion IN HER LEFT COLON.   DRINK WATER TO KEEP YOUR URINE LIGHT YELLOW.  FOLLOW A HIGH FIBER DIET.   USE COLACE ONE PILL 2 OR 3 TIMES A DAY FOR 1 TO 3 MONTHS.  TAKE LINZESS WITH FOOD.   CONTINUE RECTIV FOR ONE TO TWO MONTHS.   FOLLOW UP IN 4 MOS E30 MAR 2017 RECTAL PAIN/CHANGE IN BOWEL HABITS.   NEXT TCS IN 10 YEARS WITH AN OVERTUBE.

## 2015-10-22 NOTE — Telephone Encounter (Signed)
LMOM to call.

## 2015-10-22 NOTE — Telephone Encounter (Signed)
Letter mailed to pt to call.  

## 2015-10-22 NOTE — Telephone Encounter (Signed)
PT called and is aware of results.  

## 2015-10-27 DIAGNOSIS — M25562 Pain in left knee: Secondary | ICD-10-CM | POA: Diagnosis not present

## 2015-10-27 DIAGNOSIS — G8929 Other chronic pain: Secondary | ICD-10-CM | POA: Diagnosis not present

## 2015-10-29 ENCOUNTER — Telehealth: Payer: Self-pay | Admitting: Gastroenterology

## 2015-10-29 NOTE — Telephone Encounter (Signed)
Pt is about to run out of her Linzess and can't afford to [facility]. She doesn't want to change medication because Linzess is helping her. I told her I didn't think we had any samples at this time, but would have the nurse check.  Please advise [phone]

## 2015-10-29 NOTE — Telephone Encounter (Signed)
I called pt and she is taking Linzess 290 mcg and I have 3 boxes for her #12 tablets for her to pick up tomorrow before noon. She is aware.

## 2015-11-03 DIAGNOSIS — S83249A Other tear of medial meniscus, current injury, unspecified knee, initial encounter: Secondary | ICD-10-CM | POA: Diagnosis not present

## 2015-11-03 DIAGNOSIS — S83242D Other tear of medial meniscus, current injury, left knee, subsequent encounter: Secondary | ICD-10-CM | POA: Diagnosis not present

## 2015-11-03 DIAGNOSIS — M25561 Pain in right knee: Secondary | ICD-10-CM | POA: Diagnosis not present

## 2015-11-03 NOTE — Telephone Encounter (Signed)
REVIEWED-NO ADDITIONAL RECOMMENDATIONS. 

## 2015-11-04 DIAGNOSIS — R2231 Localized swelling, mass and lump, right upper limb: Secondary | ICD-10-CM | POA: Diagnosis not present

## 2015-11-05 ENCOUNTER — Ambulatory Visit (HOSPITAL_COMMUNITY)
Admission: RE | Admit: 2015-11-05 | Discharge: 2015-11-05 | Disposition: A | Discharge status: Home / Self Care | Payer: Medicare Other | Source: Ambulatory Visit | Attending: Family Medicine | Admitting: Family Medicine

## 2015-11-05 ENCOUNTER — Telehealth: Payer: Self-pay | Admitting: Family Medicine

## 2015-11-05 ENCOUNTER — Other Ambulatory Visit: Payer: Self-pay | Admitting: Family Medicine

## 2015-11-05 DIAGNOSIS — Z01818 Encounter for other preprocedural examination: Secondary | ICD-10-CM | POA: Diagnosis not present

## 2015-11-05 DIAGNOSIS — D649 Anemia, unspecified: Secondary | ICD-10-CM | POA: Diagnosis not present

## 2015-11-05 DIAGNOSIS — E890 Postprocedural hypothyroidism: Secondary | ICD-10-CM | POA: Diagnosis not present

## 2015-11-05 IMAGING — DX DG CHEST 2V
2 series · 2 of 2 positions shown · non-contrast
Comparison: CT the abdomen and pelvis [DATE] and chest
x-ray [DATE]

CLINICAL DATA: Preoperative study prior to knee surgery

EXAM:
CHEST  2 VIEW

[chest pa]
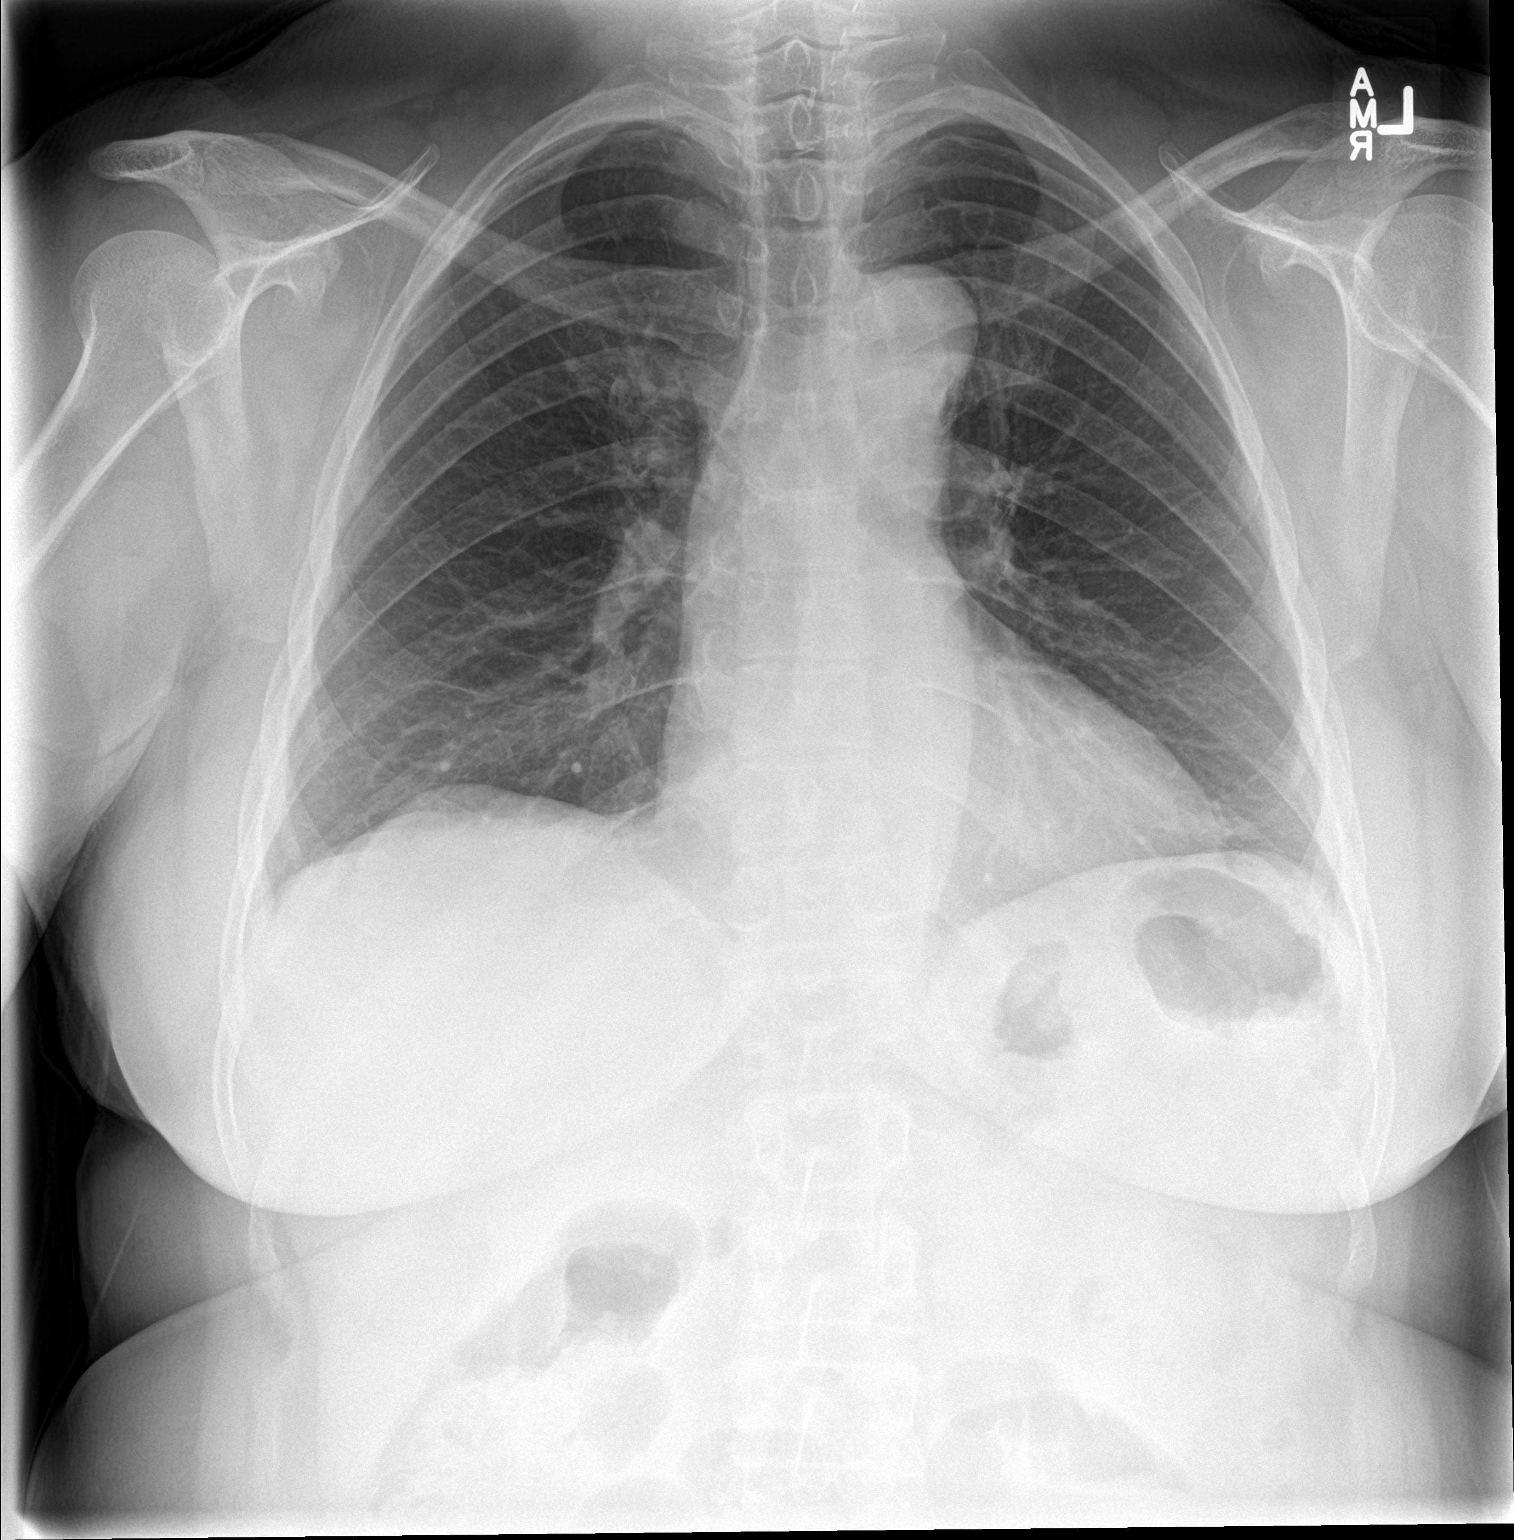

[chest lat]
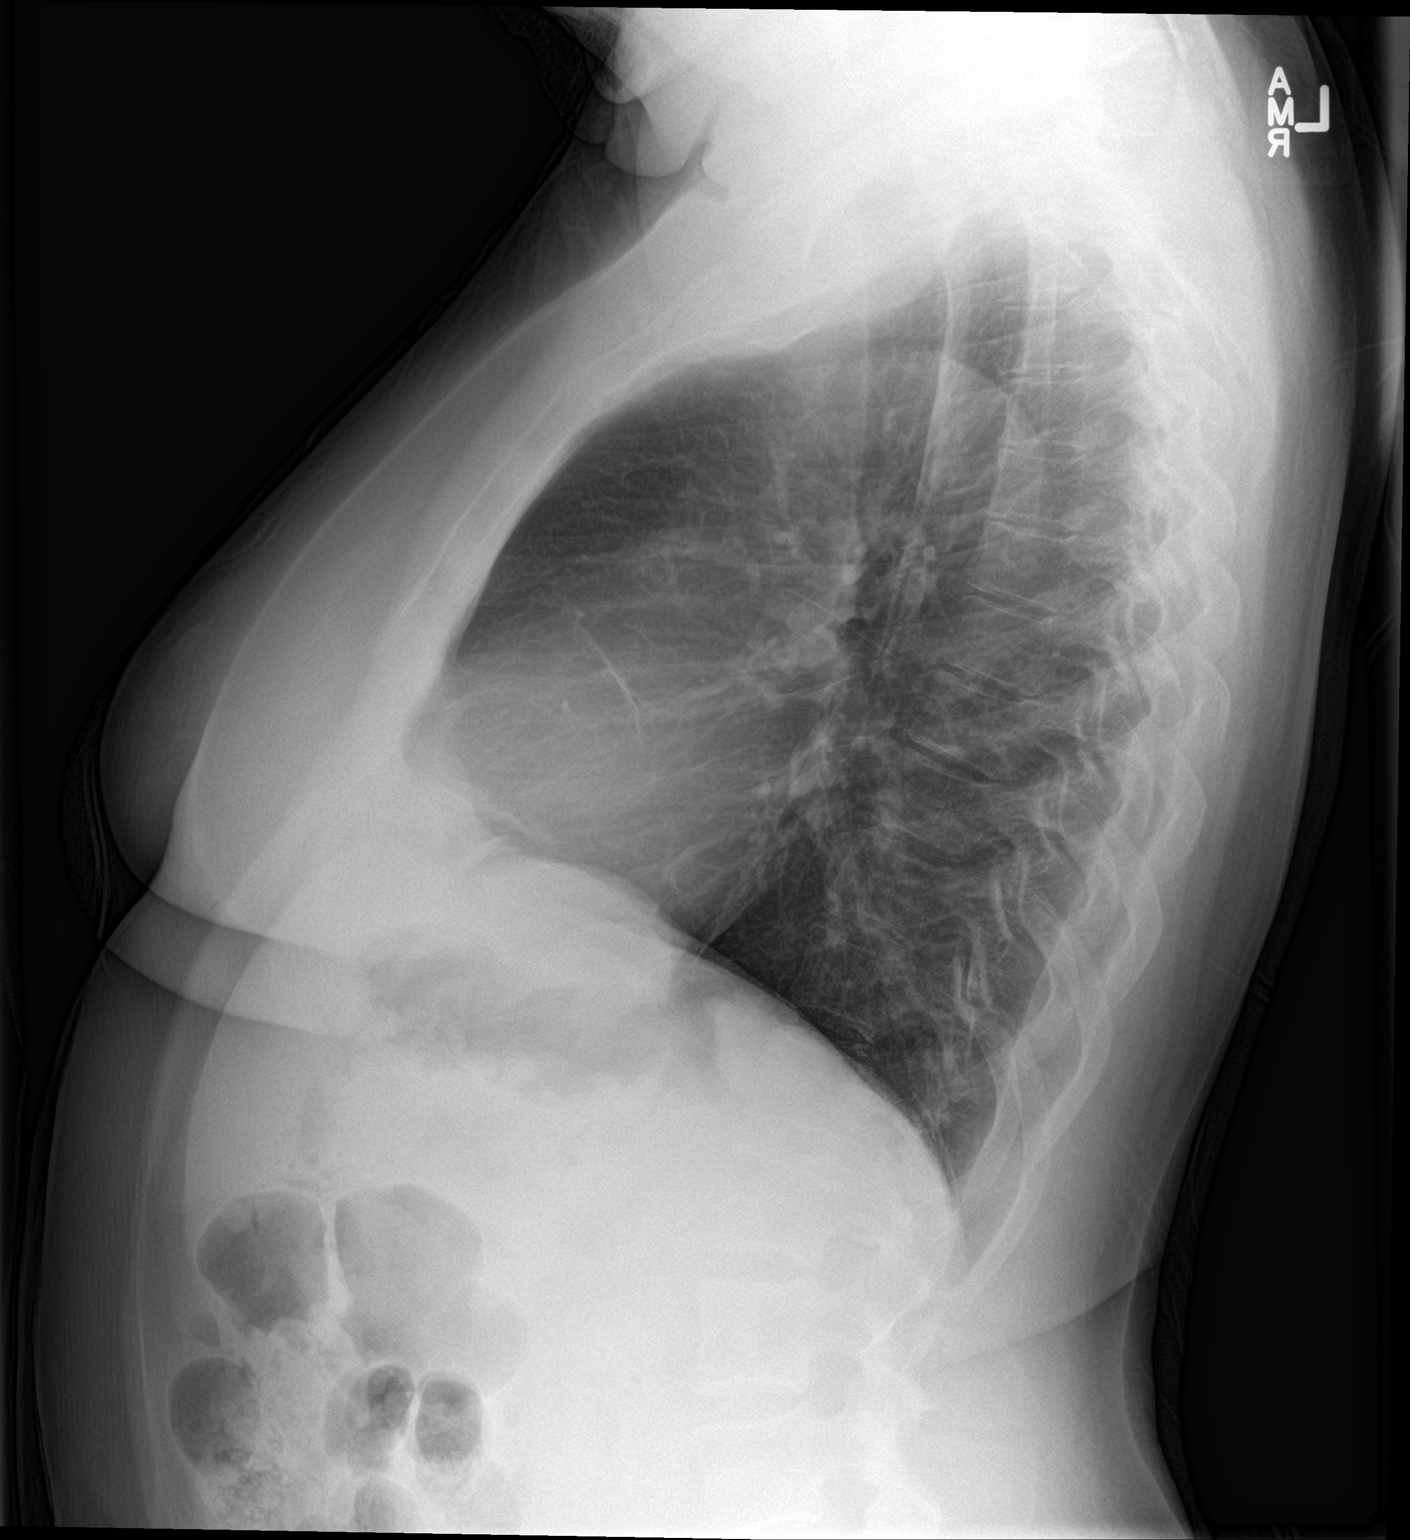

[2 of 2 positions shown; findings below may reference images not displayed]

FINDINGS: A nodular density projects in the left base adjacent to the cardiac
apex. However, this finding is unchanged since the most recent chest
x-ray. This likely represents scarring or is associated with a rib
end at this level as no nodularity is seen in this region on the CT
of the abdomen and pelvis from [DATE] which did image
this area. This finding is of no significance. No suspicious nodules
or masses. No evidence of pneumonia. The heart, hila, mediastinum,
and pleura are normal.
IMPRESSION: No active cardiopulmonary disease.

## 2015-11-05 NOTE — Addendum Note (Signed)
Addended by: Abner GreenspanHUDY, Scharlene Catalina H on: 11/05/2015 01:39 PM   Modules accepted: Orders

## 2015-11-05 NOTE — Telephone Encounter (Signed)
Pt needs CBC, chem 7, tSH CXR Pa and lateral for preop exam in office , request from ortho for clearance pls arrange and get this scheduled during January , soon as possible, thanks

## 2015-11-05 NOTE — Telephone Encounter (Signed)
Scheduled for Monday and tests ordered and patient aware

## 2015-11-06 LAB — CBC WITH DIFFERENTIAL/PLATELET
BASOS PCT: 1 % (ref 0–1)
Basophils Absolute: 0.1 10*3/uL (ref 0.0–0.1)
EOS ABS: 0.1 10*3/uL (ref 0.0–0.7)
Eosinophils Relative: 1 % (ref 0–5)
HCT: 33.5 % — ABNORMAL LOW (ref 36.0–46.0)
Hemoglobin: 10.8 g/dL — ABNORMAL LOW (ref 12.0–15.0)
Lymphocytes Relative: 32 % (ref 12–46)
Lymphs Abs: 2 10*3/uL (ref 0.7–4.0)
MCH: 27.1 pg (ref 26.0–34.0)
MCHC: 32.2 g/dL (ref 30.0–36.0)
MCV: 84 fL (ref 78.0–100.0)
MONOS PCT: 9 % (ref 3–12)
MPV: 9.2 fL (ref 8.6–12.4)
Monocytes Absolute: 0.6 10*3/uL (ref 0.1–1.0)
NEUTROS PCT: 57 % (ref 43–77)
Neutro Abs: 3.5 10*3/uL (ref 1.7–7.7)
PLATELETS: 288 10*3/uL (ref 150–400)
RBC: 3.99 MIL/uL (ref 3.87–5.11)
RDW: 13.5 % (ref 11.5–15.5)
WBC: 6.2 10*3/uL (ref 4.0–10.5)

## 2015-11-07 LAB — BASIC METABOLIC PANEL
BUN: 13 mg/dL (ref 7–25)
CALCIUM: 9.6 mg/dL (ref 8.6–10.4)
CO2: 29 mmol/L (ref 20–31)
CREATININE: 1.07 mg/dL — AB (ref 0.50–0.99)
Chloride: 100 mmol/L (ref 98–110)
Glucose, Bld: 66 mg/dL (ref 65–99)
Potassium: 4 mmol/L (ref 3.5–5.3)
Sodium: 139 mmol/L (ref 135–146)

## 2015-11-07 LAB — TSH: TSH: 0.863 u[IU]/mL (ref 0.350–4.500)

## 2015-11-09 ENCOUNTER — Ambulatory Visit: Payer: Medicare Other | Admitting: Family Medicine

## 2015-11-10 LAB — IRON: Iron: 77 ug/dL (ref 45–160)

## 2015-11-10 LAB — FERRITIN: Ferritin: 145 ng/mL (ref 10–291)

## 2015-11-20 ENCOUNTER — Encounter: Payer: Self-pay | Admitting: Family Medicine

## 2015-11-20 ENCOUNTER — Telehealth: Payer: Self-pay | Admitting: Gastroenterology

## 2015-11-20 ENCOUNTER — Other Ambulatory Visit (HOSPITAL_COMMUNITY)
Admission: RE | Admit: 2015-11-20 | Discharge: 2015-11-20 | Disposition: A | Discharge status: Home / Self Care | Payer: Medicare Other | Source: Ambulatory Visit | Attending: Family Medicine | Admitting: Family Medicine

## 2015-11-20 ENCOUNTER — Ambulatory Visit (INDEPENDENT_AMBULATORY_CARE_PROVIDER_SITE_OTHER): Payer: Medicare Other | Admitting: Family Medicine

## 2015-11-20 VITALS — BP 122/82 | HR 85 | Resp 16 | Ht 64.0 in | Wt 180.0 lb

## 2015-11-20 DIAGNOSIS — Z23 Encounter for immunization: Secondary | ICD-10-CM | POA: Diagnosis not present

## 2015-11-20 DIAGNOSIS — N3001 Acute cystitis with hematuria: Secondary | ICD-10-CM

## 2015-11-20 DIAGNOSIS — Z01818 Encounter for other preprocedural examination: Secondary | ICD-10-CM | POA: Diagnosis not present

## 2015-11-20 LAB — POCT URINALYSIS DIPSTICK
Bilirubin, UA: NEGATIVE
Glucose, UA: NEGATIVE
Ketones, UA: NEGATIVE
Nitrite, UA: NEGATIVE
Protein, UA: 30
Spec Grav, UA: 1.025
Urobilinogen, UA: 0.2
pH, UA: 6

## 2015-11-20 NOTE — Progress Notes (Signed)
   Subjective:    Patient ID: Laura [name], female    DOB: [birth year], 65 y.o.   MRN: [id number]  HPI Pt in for pre op medical clearance for left knee replacement surgery. She has no complaints, and feels well. She has worked  on dietary change with excellent weight loss success in the past 12 months, which bodes well for her health and success with surgery. Recent labs, CXR and EKG within the past 3 months are reviewed and are good. Urinalysis in house needs further testing prior to clearance as suggests mild infection   Review of Systems See HPI Denies recent fever or chills. Denies sinus pressure, nasal congestion, ear pain or sore throat. Denies chest congestion, productive cough or wheezing. Denies chest pains, palpitations and leg swelling Denies abdominal pain, nausea, vomiting,diarrhea or constipation.   Denies dysuria, frequency, hesitancy or incontinence. C/o left knee pain  and limitation in mobility. Denies headaches, seizures, numbness, or tingling. Denies depression, anxiety or insomnia. Denies skin break down or rash.        Objective:   Physical Exam  BP 122/82 mmHg  Pulse 85  Resp 16  Ht  (1.626 m)  Wt 180 lb (81.647 kg)  BMI 30.88 kg/m2  SpO2 100% Patient alert and oriented and in no cardiopulmonary distress.  HEENT: No facial asymmetry, EOMI,   oropharynx pink and moist.  Neck supple no JVD, no mass.  Chest: Clear to auscultation bilaterally.  CVS: S1, S2 no murmurs, no S3.Regular rate.  ABD: Soft non tender.   Ext: No edema  MS: Adequate ROM spine, shoulders, hips and right  knee.  Skin: Intact, no ulcerations or rash noted.  Psych: Good eye contact, normal affect. Memory intact not anxious or depressed appearing.  CNS: CN 2-12 intact, power,  normal throughout.no focal deficits noted.       Assessment & Plan:  Need for 23-polyvalent pneumococcal polysaccharide vaccine After obtaining informed consent, the vaccine is   administered by LPN.   Preoperative examination Exam as documented. Patient is medically cleared for left knee surgery, pending result of urine cultuure.  Acute cystitis with hematuria Asymptomatic, but abn UA with upcoming surgery planned , will treat if positive culture

## 2015-11-20 NOTE — Telephone Encounter (Signed)
Pt called to see if she can get Linzess samples. Please call [phone]

## 2015-11-20 NOTE — Patient Instructions (Signed)
F/u in 4.5 month, call if ypu need me sooner  Pneuoinia 23 vaccine today   You will be contacted next week as soon as available regarding you r urine test, once this is sorted out, you will be cleared for surgery  All the best  Thanks for choosing [facility], we consider it a privelige to serve you.  Congrats on improved health and weight loss

## 2015-11-21 ENCOUNTER — Other Ambulatory Visit: Payer: Self-pay | Admitting: Family Medicine

## 2015-11-21 DIAGNOSIS — N3001 Acute cystitis with hematuria: Secondary | ICD-10-CM | POA: Insufficient documentation

## 2015-11-21 LAB — URINE CULTURE

## 2015-11-21 NOTE — Assessment & Plan Note (Signed)
Asymptomatic, but abn UA with upcoming surgery planned , will treat if positive culture

## 2015-11-21 NOTE — Assessment & Plan Note (Signed)
Exam as documented. Patient is medically cleared for left knee surgery, pending result of urine cultuure.

## 2015-11-21 NOTE — Assessment & Plan Note (Signed)
After obtaining informed consent, the vaccine is  administered by LPN.  

## 2015-11-23 ENCOUNTER — Other Ambulatory Visit: Payer: Self-pay

## 2015-11-23 DIAGNOSIS — R319 Hematuria, unspecified: Secondary | ICD-10-CM

## 2015-11-25 ENCOUNTER — Other Ambulatory Visit: Payer: Self-pay | Admitting: Family Medicine

## 2015-11-25 NOTE — Telephone Encounter (Signed)
LMOM that I have samples of Linzess 290 mcg at front for pick up. #12 ( 3 boxes)

## 2015-11-25 NOTE — Telephone Encounter (Signed)
REVIEWED-NO ADDITIONAL RECOMMENDATIONS. 

## 2015-12-02 ENCOUNTER — Telehealth: Payer: Self-pay

## 2015-12-10 ENCOUNTER — Other Ambulatory Visit: Payer: Self-pay | Admitting: Family Medicine

## 2015-12-21 DIAGNOSIS — R319 Hematuria, unspecified: Secondary | ICD-10-CM | POA: Diagnosis not present

## 2015-12-22 LAB — URINALYSIS
Bilirubin Urine: NEGATIVE
GLUCOSE, UA: NEGATIVE
HGB URINE DIPSTICK: NEGATIVE
Ketones, ur: NEGATIVE
NITRITE: NEGATIVE
PROTEIN: NEGATIVE
Specific Gravity, Urine: 1.022 (ref 1.001–1.035)
pH: 6 (ref 5.0–8.0)

## 2015-12-23 ENCOUNTER — Encounter: Payer: Medicare Other | Admitting: Family Medicine

## 2015-12-25 ENCOUNTER — Other Ambulatory Visit: Payer: Self-pay | Admitting: Family Medicine

## 2015-12-30 ENCOUNTER — Encounter: Payer: Medicare Other | Admitting: Family Medicine

## 2015-12-30 DIAGNOSIS — M65862 Other synovitis and tenosynovitis, left lower leg: Secondary | ICD-10-CM | POA: Diagnosis not present

## 2015-12-30 DIAGNOSIS — S83241A Other tear of medial meniscus, current injury, right knee, initial encounter: Secondary | ICD-10-CM | POA: Diagnosis not present

## 2015-12-30 DIAGNOSIS — M25562 Pain in left knee: Secondary | ICD-10-CM | POA: Diagnosis not present

## 2015-12-30 DIAGNOSIS — M94262 Chondromalacia, left knee: Secondary | ICD-10-CM | POA: Diagnosis not present

## 2015-12-30 DIAGNOSIS — Y999 Unspecified external cause status: Secondary | ICD-10-CM | POA: Diagnosis not present

## 2015-12-30 DIAGNOSIS — G8918 Other acute postprocedural pain: Secondary | ICD-10-CM | POA: Diagnosis not present

## 2015-12-30 DIAGNOSIS — M2342 Loose body in knee, left knee: Secondary | ICD-10-CM | POA: Diagnosis not present

## 2015-12-30 DIAGNOSIS — M23322 Other meniscus derangements, posterior horn of medial meniscus, left knee: Secondary | ICD-10-CM | POA: Diagnosis not present

## 2015-12-30 DIAGNOSIS — Y939 Activity, unspecified: Secondary | ICD-10-CM | POA: Diagnosis not present

## 2015-12-30 DIAGNOSIS — X58XXXA Exposure to other specified factors, initial encounter: Secondary | ICD-10-CM | POA: Diagnosis not present

## 2015-12-30 DIAGNOSIS — M958 Other specified acquired deformities of musculoskeletal system: Secondary | ICD-10-CM | POA: Diagnosis not present

## 2015-12-30 DIAGNOSIS — Y929 Unspecified place or not applicable: Secondary | ICD-10-CM | POA: Diagnosis not present

## 2015-12-30 HISTORY — PX: OTHER SURGICAL HISTORY: SHX169

## 2016-01-12 ENCOUNTER — Ambulatory Visit (HOSPITAL_COMMUNITY): Payer: Medicare Other | Attending: Orthopedic Surgery | Admitting: Physical Therapy

## 2016-01-12 ENCOUNTER — Encounter (HOSPITAL_COMMUNITY): Payer: Self-pay | Admitting: Physical Therapy

## 2016-01-12 DIAGNOSIS — S83207S Unspecified tear of unspecified meniscus, current injury, left knee, sequela: Secondary | ICD-10-CM | POA: Diagnosis not present

## 2016-01-12 DIAGNOSIS — M25562 Pain in left knee: Secondary | ICD-10-CM | POA: Insufficient documentation

## 2016-01-12 DIAGNOSIS — M24662 Ankylosis, left knee: Secondary | ICD-10-CM

## 2016-01-12 DIAGNOSIS — R29898 Other symptoms and signs involving the musculoskeletal system: Secondary | ICD-10-CM | POA: Diagnosis not present

## 2016-01-12 DIAGNOSIS — X58XXXS Exposure to other specified factors, sequela: Secondary | ICD-10-CM | POA: Diagnosis not present

## 2016-01-12 NOTE — Therapy (Signed)
[facility] [address] [place], [place], [address] Phone: [phone]   Fax:  [phone]  Physical Therapy Evaluation  Patient Details  Name: Laura [name] MRN: [id number] Date of Birth: 1951-08-03 Referring Provider: Beverely Low, MD  Encounter Date: 01/12/2016      PT End of Session - 01/12/16 1215    Visit Number 1   Number of Visits 12   Date for PT Re-Evaluation 03/08/16   Authorization Type Medicare   Authorization Time Period G-codes by 9th visit   Authorization - Visit Number 1   Authorization - Number of Visits 12   PT Start Time 0800   PT Stop Time 0846   PT Time Calculation (min) 46 min   Activity Tolerance Patient tolerated treatment well   Behavior During Therapy [facility] for tasks assessed/performed      Past Medical History  Diagnosis Date  . GERD (gastroesophageal reflux disease)   . Gastritis   . Hypertension   . Hyperlipidemia   . Anemia   . Cystitis   . Osteopenia   . Shoulder pain   . Hypothyroidism   . Hashimoto's thyroiditis     Hx   . Urinary incontinence   . Anxiety   . Depression     Past Surgical History  Procedure Laterality Date  . Total abdominal hysterectomy  1994  . Umbilical hernia repair    . Breast excisional biopsy  2010    Excisional biopsy of benign left breast mass -lopoma   . Resection of left lobe of thyroid    . Bilateral tubal ligation    . Right carpal tunnel release    . Urethral dilation for stenosis  2009  . Rt. knee athroscopy  2004  . Colonscopy  2005    Dr. Katrinka Blazing  . Esophagogastroduodenoscopy  12/2009    chronic gastritis  . Breast surgery      left nreast biopsy  . Colonoscopy  08/26/2011    SLF: 1. Internal hemorrhoids  . Colonoscopy N/A 10/06/2015    Procedure: COLONOSCOPY;  Surgeon: West Bali, MD;  Location: [facility];  Service: Endoscopy;  Laterality: N/A;  1315     There were no vitals filed for this visit.  Visit Diagnosis:  Decreased range  of motion of knee, left  Knee pain, acute, left  Decreased strength involving knee joint  Meniscus tear, left, sequela      Subjective Assessment - 01/12/16 0813    Subjective Pt reports that she has had 2-3 months of progressive Lt knee pain. She did seek medical evaluation and received an injections which did not provide any significant relief. Following this she went in for an MRI and a Lt meniscal tear was found. She underwent arthroscopic surgery for debridment of the meniscus on 12/30/15. Pt states that her knee is getting better but still not back to being herself.    Limitations Sitting;Standing;Walking   How long can you sit comfortably? unlimited if leg elevated   How long can you stand comfortably? 30 minutes   How long can you walk comfortably? 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    Currently in Pain? Yes   Pain Score 5    Pain Location Knee   Pain Orientation Left   Pain Descriptors / Indicators Dull   Pain Type Acute pain   Pain Radiating Towards denied   Pain Onset  More than a month ago   Pain Frequency Constant   Aggravating Factors  knee is extended, sitting too long   Pain Relieving Factors changing positions, ice   Effect of Pain on Daily Activities limits tolerance to daily tasks            [facility] PT Assessment - 01/12/16 0001    Assessment   Medical Diagnosis Lt knee scope, meniscus debridement   Referring Provider Beverely Low, MD   Onset Date/Surgical Date 12/30/15   Next MD Visit 02/04/16   Prior Therapy none   Precautions   Precautions None   Balance Screen   Has the patient fallen in the past 6 months No   Prior Function   Level of Independence Independent with community mobility with device   Vocation Retired   IT consultant   Overall Cognitive Status Within Functional Limits for tasks assessed   Observation/Other Assessments   Observations Lt knee effusion   Focus on Therapeutic  Outcomes (FOTO)  53% limitation   Sensation   Light Touch Appears Intact   AROM   Left Knee Extension 5  from full extension, with reports of pain   Left Knee Flexion 82  heelslide   Strength   Left Knee Flexion 4/5  sore with resistance   Left Knee Extension 4-/5   Ambulation/Gait   Ambulation/Gait Yes   Ambulation Distance (Feet) 265 Feet  32min25sec.   Assistive device None   Gait Pattern Step-through pattern   Gait Comments Even strides, lacking full knee extension on Lt with stance phase. No loss of balance.                            PT Education - 01/12/16 1215    Education provided Yes   Education Details HEP, POC.   Person(s) Educated Patient   Methods Explanation;Demonstration;Tactile cues;Verbal cues;Handout   Comprehension Verbalized understanding;Returned demonstration          PT Short Term Goals - 01/12/16 1333    PT SHORT TERM GOAL #1   Title Pt to be independent with HEP for Lt knee ROM and strengthening   Time 3   Period Weeks   Status New   PT SHORT TERM GOAL #2   Title Pt to have 0 degrees on Lt knee extension for gait stability.   Time 3   Period Weeks   Status New   PT SHORT TERM GOAL #3   Title Pt to have 115 degrees of lt knee flexion for driving tasks.    Time 3   Period Weeks   Status New   PT SHORT TERM GOAL #4   Title Pt to ambulate community distances without assistive device.    Time 3   Period Weeks   Status New           PT Long Term Goals - 01/12/16 1337    PT LONG TERM GOAL #1   Title Pt to be independent with a progressive HEP for continuation of PT gains upon D/C.    Time 6   Period Weeks   Status New   PT LONG TERM GOAL #2   Title Patient to have greater or equal to 120 degrees of lt knee flexion for sitting on low surfaces.    Time 6   Period Weeks   Status New   PT LONG TERM GOAL #3   Title Patient to have 4+/5 strength with Lt knee flexion  and extension for functional tasks.     Time 6    Period Weeks   Status New   PT LONG TERM GOAL #4   Title Patient to have FOTO score of 40% restriction or less.    Time 6   Period Weeks   Status New   PT LONG TERM GOAL #5   Title Pt to ambulate up/down 4 steps with hand rail with alternating steps.    Time 6   Period Weeks   Status New               Plan - 01/12/16 1324    Clinical Impression Statement Pt is a 65 y.o. female now s/p Lt knee scope and meniscal debridement. Pt reports that she is unable to recall any specific cause but it was progressing in pain and not responding to conservative treatments. At the time to the evauation the patient was presenting with decreased knee ROM and strength. Functionally her mobility is limited including ambulation and stairs. She is currently using a SPC for community ambulation and is unable to drive which is limiting her independence. The patient is appropriate for ongoing PT sessions to address these areas and increase her overall independence.    Pt will benefit from skilled therapeutic intervention in order to improve on the following deficits Abnormal gait;Decreased activity tolerance;Decreased balance;Pain;Decreased range of motion;Impaired flexibility;Difficulty walking;Decreased strength   Rehab Potential Good   PT Frequency 2x / week   PT Duration 6 weeks   PT Treatment/Interventions ADLs/Self Care Home Management;Cryotherapy;Electrical Stimulation;Moist Heat;Ultrasound;Patient/family education;Neuromuscular re-education;Therapeutic exercise;Balance training;Therapeutic activities;Functional mobility training;Stair training;Gait training;Manual techniques;Taping   PT Next Visit Plan Progress strengthening and ROM through Lt LE. Review and modify HEP as needed.    PT Home Exercise Plan Started HEP review and modify as needed.    Consulted and Agree with Plan of Care Patient          G-Codes - 01/12/16 1343    Functional Assessment Tool Used FOTO and clinical judgment    Functional Limitation Mobility: Walking and moving around   Mobility: Walking and Moving Around Current Status [phone]([id number]) At least 40 percent but less than 60 percent impaired, limited or restricted   Mobility: Walking and Moving Around Goal Status [phone]([id number]) At least 20 percent but less than 40 percent impaired, limited or restricted       Problem List Patient Active Problem List   Diagnosis Date Noted  . Acute cystitis with hematuria 11/21/2015  . Preoperative examination 11/20/2015  . Need for 23-polyvalent pneumococcal polysaccharide vaccine 11/20/2015  . Change in bowel habits 09/16/2015  . Rectal bleeding 09/16/2015  . Knee pain, left 09/08/2015  . Allergic rhinitis 10/08/2014  . GAD (generalized anxiety disorder) 09/23/2014  . POSTSURGICAL HYPOTHYROIDISM 06/22/2010  . CARPAL TUNNEL SYNDROME, BILATERAL 10/22/2009  . Anemia 08/04/2009  . GERD 04/20/2009  . Hyperlipidemia LDL goal <100 05/16/2008  . Essential hypertension 05/16/2008  . OSTEOPENIA 05/16/2008  . Urinary incontinence 05/16/2008    Christiane HaBenjamin J. Illias Pantano, PT, CSCS Pager [phone] Office [phone]  01/12/2016, 4:30 PM  [facility] [address] [place], [place], [address] Phone: [phone]   Fax:  [phone]  Name: Skip EstimableJudy C Gerding MRN: [id number] Date of Birth: [birth year]

## 2016-01-13 ENCOUNTER — Ambulatory Visit (HOSPITAL_COMMUNITY): Payer: Medicare Other | Admitting: Physical Therapy

## 2016-01-13 DIAGNOSIS — S83207S Unspecified tear of unspecified meniscus, current injury, left knee, sequela: Secondary | ICD-10-CM

## 2016-01-13 DIAGNOSIS — M25562 Pain in left knee: Secondary | ICD-10-CM | POA: Diagnosis not present

## 2016-01-13 DIAGNOSIS — M24662 Ankylosis, left knee: Secondary | ICD-10-CM

## 2016-01-13 DIAGNOSIS — R29898 Other symptoms and signs involving the musculoskeletal system: Secondary | ICD-10-CM

## 2016-01-13 NOTE — Therapy (Signed)
[facility] [address] [place], [place], [address] Phone: [phone]   Fax:  [phone]  Physical Therapy Treatment  Patient Details  Name: Skip EstimableJudy C Rettke MRN: [id number] Date of Birth: 1950-12-17 Referring Provider: Beverely LowSteve Norris, MD  Encounter Date: 01/13/2016      PT End of Session - 01/13/16 1024    Visit Number 2   Number of Visits 12   Date for PT Re-Evaluation 03/08/16   Authorization Type Medicare   Authorization Time Period G-codes by 9th visit   Authorization - Visit Number 2   Authorization - Number of Visits 12   PT Start Time 0935   PT Stop Time 1018   PT Time Calculation (min) 43 min   Activity Tolerance Patient tolerated treatment well   Behavior During Therapy [facility] for tasks assessed/performed      Past Medical History  Diagnosis Date  . GERD (gastroesophageal reflux disease)   . Gastritis   . Hypertension   . Hyperlipidemia   . Anemia   . Cystitis   . Osteopenia   . Shoulder pain   . Hypothyroidism   . Hashimoto's thyroiditis     Hx   . Urinary incontinence   . Anxiety   . Depression     Past Surgical History  Procedure Laterality Date  . Total abdominal hysterectomy  1994  . Umbilical hernia repair    . Breast excisional biopsy  2010    Excisional biopsy of benign left breast mass -lopoma   . Resection of left lobe of thyroid    . Bilateral tubal ligation    . Right carpal tunnel release    . Urethral dilation for stenosis  2009  . Rt. knee athroscopy  2004  . Colonscopy  2005    Dr. Katrinka BlazingSmith  . Esophagogastroduodenoscopy  12/2009    chronic gastritis  . Breast surgery      left nreast biopsy  . Colonoscopy  08/26/2011    SLF: 1. Internal hemorrhoids  . Colonoscopy N/A 10/06/2015    Procedure: COLONOSCOPY;  Surgeon: West BaliSandi L Fields, MD;  [facility];  Service: Endoscopy;  Laterality: N/A;  1315     There were no vitals filed for this visit.  Visit Diagnosis:  Decreased range  of motion of knee, left  Knee pain, acute, left  Decreased strength involving knee joint  Meniscus tear, left, sequela      Subjective Assessment - 01/12/16 0813    Subjective Pt reports that she has had 2-3 months of progressive Lt knee pain. She did seek medical evaluation and received an injections which did not provide any significant relief. Following this she went in for an MRI and a Lt meniscal tear was found. She underwent arthroscopic surgery for debridment of the meniscus on 12/30/15. Pt states that her knee is getting better but still not back to being herself.    Limitations Sitting;Standing;Walking   How long can you sit comfortably? unlimited if leg elevated   How long can you stand comfortably? 30 minutes   How long can you walk comfortably? 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    Currently in Pain? Yes   Pain Score 5    Pain Location Knee   Pain Orientation Left   Pain Descriptors / Indicators Dull   Pain Type Acute pain   Pain Radiating Towards denied   Pain Onset  More than a month ago   Pain Frequency Constant   Aggravating Factors  knee is extended, sitting too long   Pain Relieving Factors changing positions, ice   Effect of Pain on Daily Activities limits tolerance to daily tasks                         [facility] Adult PT Treatment/Exercise - 01/13/16 0952    Knee/Hip Exercises: Standing   Gait Training gait without AD 226 feet   Knee/Hip Exercises: Supine   Quad Sets Left;10 reps   Short Arc Quad Sets Left;10 reps   Heel Slides Left;10 reps   Bridges 10 reps   Straight Leg Raises Left;10 reps   Knee/Hip Exercises: Sidelying   Hip ABduction Left;10 reps   Knee/Hip Exercises: Prone   Hamstring Curl 10 reps   Hip Extension 10 reps;Left   Manual Therapy   Manual Therapy Soft tissue mobilization   Manual therapy comments done at end of session separate from therex    Soft tissue  mobilization to decrease scar tissue at surgical sites                PT Education - 01/13/16 1004    Education provided Yes   Education Details educated on surgical process and reason for incision sites and swelling/scar tissue.  Given copy of initial evaluation, reviewed goals and HEP   Person(s) Educated Patient   Methods Explanation;Demonstration;Handout   Comprehension Verbalized understanding;Returned demonstration;Tactile cues required;Need further instruction          PT Short Term Goals - 01/12/16 1333    PT SHORT TERM GOAL #1   Title Pt to be independent with HEP for Lt knee ROM and strengthening   Time 3   Period Weeks   Status New   PT SHORT TERM GOAL #2   Title Pt to have 0 degrees on Lt knee extension for gait stability.   Time 3   Period Weeks   Status New   PT SHORT TERM GOAL #3   Title Pt to have 115 degrees of lt knee flexion for driving tasks.    Time 3   Period Weeks   Status New   PT SHORT TERM GOAL #4   Title Pt to ambulate community distances without assistive device.    Time 3   Period Weeks   Status New           PT Long Term Goals - 01/12/16 1337    PT LONG TERM GOAL #1   Title Pt to be independent with a progressive HEP for continuation of PT gains upon D/C.    Time 6   Period Weeks   Status New   PT LONG TERM GOAL #2   Title Patient to have greater or equal to 120 degrees of lt knee flexion for sitting on low surfaces.    Time 6   Period Weeks   Status New   PT LONG TERM GOAL #3   Title Patient to have 4+/5 strength with Lt knee flexion and extension for functional tasks.     Time 6   Period Weeks   Status New   PT LONG TERM GOAL #4   Title Patient to have FOTO score of 40% restriction or less.    Time 6   Period Weeks   Status New   PT LONG TERM GOAL #5   Title Pt to ambulate up/down 4 steps with hand rail  with alternating steps.    Time 6   Period Weeks   Status New               Plan - 01/13/16 1039     Clinical Impression Statement Reviewed HEP and initial evaluation including goals this session.  Pt able to complete HEP in good form and improving LT knee ROM. Worked on gait at end of session with patient completing without AD and in good form, no antalgia.  Manual completed to knee prior to gait trianing with patient reporting improvment.  Noted adhesions/scar tissue around scope sites and residual swelling.  Added therex per flowsheet with good form.  No pain reported at end of session.   Pt will benefit from skilled therapeutic intervention in order to improve on the following deficits Abnormal gait;Decreased activity tolerance;Decreased balance;Pain;Decreased range of motion;Impaired flexibility;Difficulty walking;Decreased strength   Rehab Potential Good   PT Frequency 2x / week   PT Duration 6 weeks   PT Treatment/Interventions ADLs/Self Care Home Management;Cryotherapy;Electrical Stimulation;Moist Heat;Ultrasound;Patient/family education;Neuromuscular re-education;Therapeutic exercise;Balance training;Therapeutic activities;Functional mobility training;Stair training;Gait training;Manual techniques;Taping   PT Next Visit Plan Progress strengthening and ROM through Lt LE. Review and modify HEP as needed. Begin standing exercises and stretches.   PT Home Exercise Plan Started HEP review and modify as needed.    Consulted and Agree with Plan of Care Patient          G-Codes - 02-07-16 1343    Functional Assessment Tool Used FOTO and clinical judgment   Functional Limitation Mobility: Walking and moving around   Mobility: Walking and Moving Around Current Status [phone]) At least 40 percent but less than 60 percent impaired, limited or restricted   Mobility: Walking and Moving Around Goal Status [id number]) At least 20 percent but less than 40 percent impaired, limited or restricted      Problem List Patient Active Problem List   Diagnosis Date Noted  . Acute cystitis with hematuria  11/21/2015  . Preoperative examination 11/20/2015  . Need for 23-polyvalent pneumococcal polysaccharide vaccine 11/20/2015  . Change in bowel habits 09/16/2015  . Rectal bleeding 09/16/2015  . Knee pain, left 09/08/2015  . Allergic rhinitis 10/08/2014  . GAD (generalized anxiety disorder) 09/23/2014  . POSTSURGICAL HYPOTHYROIDISM 06/22/2010  . CARPAL TUNNEL SYNDROME, BILATERAL 10/22/2009  . Anemia 08/04/2009  . GERD 04/20/2009  . Hyperlipidemia LDL goal <100 05/16/2008  . Essential hypertension 05/16/2008  . OSTEOPENIA 05/16/2008  . Urinary incontinence 05/16/2008    Lurena Nida, PTA/CLT [phone]  01/13/2016, 10:47 AM  [facility] [address] [place], [place], [address] Phone: [phone]   Fax:  [phone]  Name: MANSI TOKAR MRN: [id number] Date of Birth: May 19, 1951

## 2016-01-19 ENCOUNTER — Ambulatory Visit (HOSPITAL_COMMUNITY): Payer: Medicare Other | Admitting: Physical Therapy

## 2016-01-19 DIAGNOSIS — M24662 Ankylosis, left knee: Secondary | ICD-10-CM

## 2016-01-19 DIAGNOSIS — M25562 Pain in left knee: Secondary | ICD-10-CM | POA: Diagnosis not present

## 2016-01-19 DIAGNOSIS — S83207S Unspecified tear of unspecified meniscus, current injury, left knee, sequela: Secondary | ICD-10-CM

## 2016-01-19 DIAGNOSIS — R29898 Other symptoms and signs involving the musculoskeletal system: Secondary | ICD-10-CM

## 2016-01-19 NOTE — Therapy (Signed)
[facility] [address] [place], [place], [address] Phone: [phone]   Fax:  [phone]  Physical Therapy Treatment  Patient Details  Name: Laura [name] MRN: [id number] Date of Birth: December 13, 1950 Referring Provider: Beverely LowSteve Norris, MD  Encounter Date: 01/19/2016      PT End of Session - 01/19/16 1022    Visit Number 3   Number of Visits 12   Date for PT Re-Evaluation 03/08/16   Authorization Type Medicare   Authorization Time Period G-codes by 9th visit   Authorization - Visit Number 3   Authorization - Number of Visits 12   PT Start Time 0930   PT Stop Time 1015   PT Time Calculation (min) 45 min   Activity Tolerance Patient tolerated treatment well   Behavior During Therapy [facility] for tasks assessed/performed      Past Medical History  Diagnosis Date  . GERD (gastroesophageal reflux disease)   . Gastritis   . Hypertension   . Hyperlipidemia   . Anemia   . Cystitis   . Osteopenia   . Shoulder pain   . Hypothyroidism   . Hashimoto's thyroiditis     Hx   . Urinary incontinence   . Anxiety   . Depression     Past Surgical History  Procedure Laterality Date  . Total abdominal hysterectomy  1994  . Umbilical hernia repair    . Breast excisional biopsy  2010    Excisional biopsy of benign left breast mass -lopoma   . Resection of left lobe of thyroid    . Bilateral tubal ligation    . Right carpal tunnel release    . Urethral dilation for stenosis  2009  . Rt. knee athroscopy  2004  . Colonscopy  2005    Dr. Katrinka BlazingSmith  . Esophagogastroduodenoscopy  12/2009    chronic gastritis  . Breast surgery      left nreast biopsy  . Colonoscopy  08/26/2011    SLF: 1. Internal hemorrhoids  . Colonoscopy N/A 10/06/2015    Procedure: COLONOSCOPY;  Surgeon: West BaliSandi L Fields, MD;  [facility];  Service: Endoscopy;  Laterality: N/A;  1315     There were no vitals filed for this visit.  Visit Diagnosis:  Decreased range  of motion of knee, left  Knee pain, acute, left  Decreased strength involving knee joint  Meniscus tear, left, sequela      Subjective Assessment - 01/19/16 0931    Subjective Pt reports she is walking without her cane in the home. Several days ago she was out at the grocery store without her cane and lost her balance. She says she twisted it and it has been somewhat painful since then. Also noted some swelling which she alleviated with ice.    Limitations Sitting;Standing;Walking   How long can you sit comfortably? unlimited if leg elevated   How long can you stand comfortably? 30 minutes   How long can you walk comfortably? 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    Currently in Pain? No/denies   Pain Onset --                         [facility] Adult [facility]/Exercise - 01/19/16 0001    Knee/Hip Exercises: Stretches   Gastroc Stretch 3 reps;30 seconds   Knee/Hip Exercises: Supine   Quad Sets Left;10  reps   Short Arc [employer] Left;10 reps   Heel Slides Left;10 reps;AROM   Bridges 10 reps   Straight Leg Raises AAROM;1 set;5 reps  (+) extensor lag noted   Manual Therapy   Manual Therapy Joint mobilization   Manual therapy comments performed seperately from all other interventions   Joint Mobilization superior/inferior patellar mobs grades I-III   Soft tissue mobilization portal sites, retrograde amssage with LLE elevated                PT Education - 01/19/16 1020    Education Details Reviewed and updated HEP; discussed ice/elevation and massage for edema control 2-3x/day for no longer than ; Discussed using SPC for community ambulation for safety and improved gait sequencing   Person(s) Educated Patient   Methods Explanation;Demonstration;Handout   Comprehension Verbalized understanding;Returned demonstration          PT Short Term Goals - 01/12/16 1333    PT SHORT TERM GOAL #1    Title Pt to be independent with HEP for Lt knee ROM and strengthening   Time 3   Period Weeks   Status New   PT SHORT TERM GOAL #2   Title Pt to have 0 degrees on Lt knee extension for gait stability.   Time 3   Period Weeks   Status New   PT SHORT TERM GOAL #3   Title Pt to have 115 degrees of lt knee flexion for driving tasks.    Time 3   Period Weeks   Status New   PT SHORT TERM GOAL #4   Title Pt to ambulate community distances without assistive device.    Time 3   Period Weeks   Status New           PT Long Term Goals - 01/12/16 1337    PT LONG TERM GOAL #1   Title Pt to be independent with a progressive HEP for continuation of PT gains upon D/C.    Time 6   Period Weeks   Status New   PT LONG TERM GOAL #2   Title Patient to have greater or equal to 120 degrees of lt knee flexion for sitting on low surfaces.    Time 6   Period Weeks   Status New   PT LONG TERM GOAL #3   Title Patient to have 4+/5 strength with Lt knee flexion and extension for functional tasks.     Time 6   Period Weeks   Status New   PT LONG TERM GOAL #4   Title Patient to have FOTO score of 40% restriction or less.    Time 6   Period Weeks   Status New   PT LONG TERM GOAL #5   Title Pt to ambulate up/down 4 steps with hand rail with alternating steps.    Time 6   Period Weeks   Status New               Plan - 01/19/16 1022    Clinical Impression Statement Pt continues to present with post-surgical soreness, limited terminal knee extension, and strength. Her limited knee extension ROM is impacting her gait pattern and activation of the quads during activity. She was educated on proper management techniques for swelling and pain at home as well as use of SPC in the community for improved sequencing. Pt with full understanding of updated HEP and pain free at the end of today's session.   Pt will benefit from  skilled therapeutic intervention in order to improve on the following  deficits Abnormal gait;Decreased activity tolerance;Decreased balance;Pain;Decreased range of motion;Impaired flexibility;Difficulty walking;Decreased strength   Rehab Potential Good   PT Frequency 2x / week   PT Duration 6 weeks   PT Treatment/Interventions ADLs/Self Care Home Management;Cryotherapy;Electrical Stimulation;Moist Heat;Ultrasound;Patient/family education;Neuromuscular re-education;Therapeutic exercise;Balance training;Therapeutic activities;Functional mobility training;Stair training;Gait training;Manual techniques;Taping   PT Next Visit Plan Target knee extension with therex and manual treatment next session. Progress strengthening and ROM through Lt LE. Review and modify HEP as needed. Gait training for symmetrical step/stride without AD   PT Home Exercise Plan Started HEP review and modify as needed.    Consulted and Agree with Plan of Care Patient        Problem List Patient Active Problem List   Diagnosis Date Noted  . Acute cystitis with hematuria 11/21/2015  . Preoperative examination 11/20/2015  . Need for 23-polyvalent pneumococcal polysaccharide vaccine 11/20/2015  . Change in bowel habits 09/16/2015  . Rectal bleeding 09/16/2015  . Knee pain, left 09/08/2015  . Allergic rhinitis 10/08/2014  . GAD (generalized anxiety disorder) 09/23/2014  . POSTSURGICAL HYPOTHYROIDISM 06/22/2010  . CARPAL TUNNEL SYNDROME, BILATERAL 10/22/2009  . Anemia 08/04/2009  . GERD 04/20/2009  . Hyperlipidemia LDL goal <100 05/16/2008  . Essential hypertension 05/16/2008  . OSTEOPENIA 05/16/2008  . Urinary incontinence 05/16/2008   10:32 AM,01/19/2016 Marylyn Ishihara PT, DPT [name] [phone]  [facility] [address] [place], [place], [address] Phone: [phone]   Fax:  [phone]  Name: Laura [name] MRN: [id number] Date of Birth: Sep 27, 1951

## 2016-01-21 ENCOUNTER — Ambulatory Visit (HOSPITAL_COMMUNITY): Payer: Medicare Other | Admitting: Physical Therapy

## 2016-01-21 DIAGNOSIS — M25562 Pain in left knee: Secondary | ICD-10-CM

## 2016-01-21 DIAGNOSIS — R29898 Other symptoms and signs involving the musculoskeletal system: Secondary | ICD-10-CM

## 2016-01-21 DIAGNOSIS — S83207S Unspecified tear of unspecified meniscus, current injury, left knee, sequela: Secondary | ICD-10-CM

## 2016-01-21 DIAGNOSIS — M24662 Ankylosis, left knee: Secondary | ICD-10-CM | POA: Diagnosis not present

## 2016-01-21 NOTE — Patient Instructions (Signed)
  BRIDGING  While lying on your back, tighten your lower abdominals, squeeze your buttocks and then raise your buttocks off the floor/bed as creating a "Bridge" with your body. 2x10     STRAIGHT LEG RAISE - SLR  While lying or sitting, raise up your leg with a straight knee.  Keep the opposite knee bent with the foot planted to the ground.  3x5 reps.

## 2016-01-21 NOTE — Therapy (Signed)
[facility] [address] [place], [place], [address] Phone: [phone]   Fax:  [phone]  Physical Therapy Treatment  Patient Details  Name: Laura [name] MRN: [id number] Date of Birth: 1951-06-28 Referring Provider: Beverely Low, MD  Encounter Date: 01/21/2016      PT End of Session - 01/21/16 1024    Visit Number 4   Number of Visits 12   Date for PT Re-Evaluation 03/08/16   Authorization Type Medicare   Authorization Time Period G-codes by 9th visit   Authorization - Visit Number 4   Authorization - Number of Visits 12   PT Start Time [phone]   PT Stop Time 1013   PT Time Calculation (min) 42 min   Activity Tolerance Patient tolerated treatment well   Behavior During Therapy [facility] for tasks assessed/performed      Past Medical History  Diagnosis Date  . GERD (gastroesophageal reflux disease)   . Gastritis   . Hypertension   . Hyperlipidemia   . Anemia   . Cystitis   . Osteopenia   . Shoulder pain   . Hypothyroidism   . Hashimoto's thyroiditis     Hx   . Urinary incontinence   . Anxiety   . Depression     Past Surgical History  Procedure Laterality Date  . Total abdominal hysterectomy  1994  . Umbilical hernia repair    . Breast excisional biopsy  2010    Excisional biopsy of benign left breast mass -lopoma   . Resection of left lobe of thyroid    . Bilateral tubal ligation    . Right carpal tunnel release    . Urethral dilation for stenosis  2009  . Rt. knee athroscopy  2004  . Colonscopy  2005    Dr. Katrinka Blazing  . Esophagogastroduodenoscopy  12/2009    chronic gastritis  . Breast surgery      left nreast biopsy  . Colonoscopy  08/26/2011    SLF: 1. Internal hemorrhoids  . Colonoscopy N/A 10/06/2015    Procedure: COLONOSCOPY;  Surgeon: West Bali, MD;  [facility];  Service: Endoscopy;  Laterality: N/A;  1315     There were no vitals filed for this visit.  Visit Diagnosis:  Decreased range  of motion of knee, left  Knee pain, acute, left  Decreased strength involving knee joint  Meniscus tear, left, sequela      Subjective Assessment - 01/21/16 0933    Subjective Pt reports she was sore after her last session for about 24 hours, but it has resolved now. She rates her knee pain a 4/10.    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    Currently in Pain? Yes   Pain Score 4    Pain Location Knee   Pain Orientation Left   Pain Descriptors / Indicators Aching;Dull   Pain Type Acute pain   Pain Radiating Towards denied   Pain Onset More than a month ago   Pain Frequency Constant   Aggravating Factors  walking long distances   Pain Relieving Factors sitting, performing her exercises   Effect of Pain on Daily Activities limits tolerance to ADLs            [facility] PT Assessment - 01/21/16 0001    AROM   Left Knee Extension 6  beginning of session   Left Knee Flexion 90  beginning of session  [facility] Adult PT Treatment/Exercise - 01/21/16 0001    Ambulation/Gait   Ambulation/Gait Yes   Ambulation/Gait Assistance 6: Modified independent (Device/Increase time)   Ambulation Distance (Feet) 226 Feet   Assistive device None   Gait Pattern Decreased weight shift to left   Ambulation Surface Level   Gait Comments lacking L terminal knee ext, verbal cues for L heel strike at initial contact   Knee/Hip Exercises: Stretches   Gastroc Stretch 3 reps;30 seconds   Gastroc Stretch Limitations wit hquad set   Knee/Hip Exercises: Seated   Long Arc Quad Left;2 sets;10 reps  second set with red TB   Sit to [employer] 5 reps;with UE support  (+) wt shift R, corrected with mod verbal cues   Knee/Hip Exercises: Supine   Straight Leg Raises 2 sets;5 reps   Straight Leg Raises Limitations ext lag/pain eliminated post manual treatment for 2nd set   Knee/Hip Exercises: Prone   Hamstring Curl 10 reps;5 seconds  with end range stretch     Manual Therapy   Manual Therapy Joint mobilization   Manual therapy comments performed seperately from all other interventions   Joint Mobilization grade I-III PA tibiofemoral jt mobs   Soft tissue mobilization rolling to distal quad x4 min                PT Education - 01/21/16 1022    Education provided Yes   Education Details Updated HEP; discussed implications for manual treatment; reviewed correct gait sequencing without AD   Person(s) Educated Patient   Methods Explanation;Demonstration;Verbal cues   Comprehension Verbalized understanding;Need further instruction          PT Short Term Goals - 01/12/16 1333    PT SHORT TERM GOAL #1   Title Pt to be independent with HEP for Lt knee ROM and strengthening   Time 3   Period Weeks   Status New   PT SHORT TERM GOAL #2   Title Pt to have 0 degrees on Lt knee extension for gait stability.   Time 3   Period Weeks   Status New   PT SHORT TERM GOAL #3   Title Pt to have 115 degrees of lt knee flexion for driving tasks.    Time 3   Period Weeks   Status New   PT SHORT TERM GOAL #4   Title Pt to ambulate community distances without assistive device.    Time 3   Period Weeks   Status New           PT Long Term Goals - 01/12/16 1337    PT LONG TERM GOAL #1   Title Pt to be independent with a progressive HEP for continuation of PT gains upon D/C.    Time 6   Period Weeks   Status New   PT LONG TERM GOAL #2   Title Patient to have greater or equal to 120 degrees of lt knee flexion for sitting on low surfaces.    Time 6   Period Weeks   Status New   PT LONG TERM GOAL #3   Title Patient to have 4+/5 strength with Lt knee flexion and extension for functional tasks.     Time 6   Period Weeks   Status New   PT LONG TERM GOAL #4   Title Patient to have FOTO score of 40% restriction or less.    Time 6   Period Weeks   Status New   PT LONG TERM GOAL #  5   Title Pt to ambulate up/down 4 steps with hand rail with  alternating steps.    Time 6   Period Weeks   Status New               Plan - 01/21/16 1025    Clinical Impression Statement Pt continues to demonstrate knee ROM deficits with activity. She also demonstrates mild extensor lag with SLR which greatly improved with manual treatment to her distal quad. Session focused on manual techniques for improved pain and ROM as well as gait training for improved sequencing and safety with ambulation. Pt reporting 0/10 pain at the end of today's session with improved heel toe sequencing during gait using SPC, requiring occasional verbal cues for correction. Will continue with current POC to address remaining impairments in strength, mobility and ROM.   Pt will benefit from skilled therapeutic intervention in order to improve on the following deficits Abnormal gait;Decreased activity tolerance;Decreased balance;Pain;Decreased range of motion;Impaired flexibility;Difficulty walking;Decreased strength   Rehab Potential Good   PT Frequency 2x / week   PT Duration 6 weeks   PT Treatment/Interventions ADLs/Self Care Home Management;Cryotherapy;Electrical Stimulation;Moist Heat;Ultrasound;Patient/family education;Neuromuscular re-education;Therapeutic exercise;Balance training;Therapeutic activities;Functional mobility training;Stair training;Gait training;Manual techniques;Taping   PT Next Visit Plan continue with manual therapy to address pain and knee extension, progress LE therex as tolerated. Progress strengthening and ROM through Lt LE. Review and modify HEP as needed. Gait training for symmetrical step/stride without AD   PT Home Exercise Plan updated HEP with bridge and SLR without ext. lag   Consulted and Agree with Plan of Care Patient        Problem List Patient Active Problem List   Diagnosis Date Noted  . Acute cystitis with hematuria 11/21/2015  . Preoperative examination 11/20/2015  . Need for 23-polyvalent pneumococcal polysaccharide  vaccine 11/20/2015  . Change in bowel habits 09/16/2015  . Rectal bleeding 09/16/2015  . Knee pain, left 09/08/2015  . Allergic rhinitis 10/08/2014  . GAD (generalized anxiety disorder) 09/23/2014  . POSTSURGICAL HYPOTHYROIDISM 06/22/2010  . CARPAL TUNNEL SYNDROME, BILATERAL 10/22/2009  . Anemia 08/04/2009  . GERD 04/20/2009  . Hyperlipidemia LDL goal <100 05/16/2008  . Essential hypertension 05/16/2008  . OSTEOPENIA 05/16/2008  . Urinary incontinence 05/16/2008   10:33 AM,01/21/2016 Marylyn Ishihara PT, DPT [name] [phone]  [facility] [address] [place], [place], [address] Phone: [phone]   Fax:  [phone]  Name: Laura [name] MRN: [id number] Date of Birth: [birth year]

## 2016-01-26 ENCOUNTER — Ambulatory Visit (HOSPITAL_COMMUNITY): Payer: Medicare Other | Admitting: Physical Therapy

## 2016-01-26 DIAGNOSIS — S83207S Unspecified tear of unspecified meniscus, current injury, left knee, sequela: Secondary | ICD-10-CM | POA: Diagnosis not present

## 2016-01-26 DIAGNOSIS — R29898 Other symptoms and signs involving the musculoskeletal system: Secondary | ICD-10-CM | POA: Diagnosis not present

## 2016-01-26 DIAGNOSIS — M25562 Pain in left knee: Secondary | ICD-10-CM

## 2016-01-26 DIAGNOSIS — M24662 Ankylosis, left knee: Secondary | ICD-10-CM | POA: Diagnosis not present

## 2016-01-26 NOTE — Therapy (Signed)
[facility] [address] [place], [place], [address] Phone: [phone]   Fax:  [phone]  Physical Therapy Treatment  Patient Details  Name: Laura [name] MRN: [id number] Date of Birth: 1951-03-16 Referring Provider: Beverely Low, MD  Encounter Date: 01/26/2016      PT End of Session - 01/26/16 1152    Visit Number 5   Number of Visits 12   Date for PT Re-Evaluation 03/08/16   Authorization Type Medicare   Authorization Time Period G-codes by 9th visit   Authorization - Visit Number 5   Authorization - Number of Visits 12   PT Start Time 1104   PT Stop Time 1145   PT Time Calculation (min) 41 min   Equipment Utilized During Treatment Gait belt   Activity Tolerance Patient tolerated treatment well   Behavior During Therapy [facility] for tasks assessed/performed      Past Medical History  Diagnosis Date  . GERD (gastroesophageal reflux disease)   . Gastritis   . Hypertension   . Hyperlipidemia   . Anemia   . Cystitis   . Osteopenia   . Shoulder pain   . Hypothyroidism   . Hashimoto's thyroiditis     Hx   . Urinary incontinence   . Anxiety   . Depression     Past Surgical History  Procedure Laterality Date  . Total abdominal hysterectomy  1994  . Umbilical hernia repair    . Breast excisional biopsy  2010    Excisional biopsy of benign left breast mass -lopoma   . Resection of left lobe of thyroid    . Bilateral tubal ligation    . Right carpal tunnel release    . Urethral dilation for stenosis  2009  . Rt. knee athroscopy  2004  . Colonscopy  2005    Dr. Katrinka Blazing  . Esophagogastroduodenoscopy  12/2009    chronic gastritis  . Breast surgery      left nreast biopsy  . Colonoscopy  08/26/2011    SLF: 1. Internal hemorrhoids  . Colonoscopy N/A 10/06/2015    Procedure: COLONOSCOPY;  Surgeon: West Bali, MD;  [facility];  Service: Endoscopy;  Laterality: N/A;  1315     There were no vitals filed for  this visit.  Visit Diagnosis:  Decreased range of motion of knee, left  Knee pain, acute, left  Decreased strength involving knee joint  Meniscus tear, left, sequela      Subjective Assessment - 01/26/16 1108    Subjective Pt reports she was feeling great after her last session, but she had alot of pain in her Lt quad on sunday. She tried to apply ice and massage, but it didn't seem to help much.    Limitations Sitting;Standing;Walking   How long can you sit comfortably? unlimited if leg elevated   How long can you stand comfortably? 30 minutes   How long can you walk comfortably? 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    Currently in Pain? No/denies                         [facility] Adult PT Treatment/Exercise - 01/26/16 0001    Ambulation/Gait   Ambulation/Gait Yes   Ambulation/Gait Assistance 6: Modified independent (Device/Increase time)   Ambulation Distance (Feet) 226 Feet   Assistive device None   Gait Pattern Trendelenburg  Left   Ambulation Surface Level   Stairs Yes   Stairs Assistance 5: Supervision   Stairs Assistance Details (indicate cue type and reason) SBA   Stair Management Technique One rail Left;With cane  x2 trials ascending/descending 6" and 4" steps with SBA   Number of Stairs 4   Gait Comments min verbal cues for heel contact throughout   Knee/Hip Exercises: Standing   Other Standing Knee Exercises retrostepping with BUE support 2x20   Knee/Hip Exercises: Sidelying   Clams x10 ea, with green TB   Manual Therapy   Manual Therapy Joint mobilization;Soft tissue mobilization   Manual therapy comments performed seperately from all other interventions   Joint Mobilization grade I-IV PA tibiofemoral jt mobs   Soft tissue mobilization rolling and palmar spreading to distal quad x6 min                PT Education - 01/26/16 1151    Education provided Yes   Education  Details reviewed HEP and correct sequencing with ambulating without AD   Person(s) Educated Patient   Methods Explanation;Demonstration;Verbal cues   Comprehension Verbalized understanding;Need further instruction          PT Short Term Goals - 01/12/16 1333    PT SHORT TERM GOAL #1   Title Pt to be independent with HEP for Lt knee ROM and strengthening   Time 3   Period Weeks   Status New   PT SHORT TERM GOAL #2   Title Pt to have 0 degrees on Lt knee extension for gait stability.   Time 3   Period Weeks   Status New   PT SHORT TERM GOAL #3   Title Pt to have 115 degrees of lt knee flexion for driving tasks.    Time 3   Period Weeks   Status New   PT SHORT TERM GOAL #4   Title Pt to ambulate community distances without assistive device.    Time 3   Period Weeks   Status New           PT Long Term Goals - 01/12/16 1337    PT LONG TERM GOAL #1   Title Pt to be independent with a progressive HEP for continuation of PT gains upon D/C.    Time 6   Period Weeks   Status New   PT LONG TERM GOAL #2   Title Patient to have greater or equal to 120 degrees of lt knee flexion for sitting on low surfaces.    Time 6   Period Weeks   Status New   PT LONG TERM GOAL #3   Title Patient to have 4+/5 strength with Lt knee flexion and extension for functional tasks.     Time 6   Period Weeks   Status New   PT LONG TERM GOAL #4   Title Patient to have FOTO score of 40% restriction or less.    Time 6   Period Weeks   Status New   PT LONG TERM GOAL #5   Title Pt to ambulate up/down 4 steps with hand rail with alternating steps.    Time 6   Period Weeks   Status New               Plan - 01/26/16 1153    Clinical Impression Statement Pt demonstrating improved knee ROM from 2-105 deg at the beginning of today's session. She also demonstrated improved mobility evident by her ability to ascend/descend steps  with SBA. Noting limited Lt eccentric quad control which should  improve with continued therex and activity addressing quad activation. Pt tolerated session with report of 0/10 by the end. Will continue current POC addressing limited ROM, strength, mobility and gait sequencing to improve her mobility and safety in the community.    Pt will benefit from skilled therapeutic intervention in order to improve on the following deficits Abnormal gait;Decreased activity tolerance;Decreased balance;Pain;Decreased range of motion;Impaired flexibility;Difficulty walking;Decreased strength   Rehab Potential Good   PT Frequency 2x / week   PT Duration 6 weeks   PT Treatment/Interventions ADLs/Self Care Home Management;Cryotherapy;Electrical Stimulation;Moist Heat;Ultrasound;Patient/family education;Neuromuscular re-education;Therapeutic exercise;Balance training;Therapeutic activities;Functional mobility training;Stair training;Gait training;Manual techniques;Taping   PT Next Visit Plan continue with manual therapy to address pain and knee extension, progress LE therex as tolerated. Progress strengthening and ROM through Lt LE. Review and modify HEP as needed. Gait training for symmetrical step/stride without AD   PT Home Exercise Plan Provide updated HEP next visit: bridge, hip abd against wall, knee flexion stretch, hamstring therex, etc.; manual for knee flex/ext   Consulted and Agree with Plan of Care Patient        Problem List Patient Active Problem List   Diagnosis Date Noted  . Acute cystitis with hematuria 11/21/2015  . Preoperative examination 11/20/2015  . Need for 23-polyvalent pneumococcal polysaccharide vaccine 11/20/2015  . Change in bowel habits 09/16/2015  . Rectal bleeding 09/16/2015  . Knee pain, left 09/08/2015  . Allergic rhinitis 10/08/2014  . GAD (generalized anxiety disorder) 09/23/2014  . POSTSURGICAL HYPOTHYROIDISM 06/22/2010  . CARPAL TUNNEL SYNDROME, BILATERAL 10/22/2009  . Anemia 08/04/2009  . GERD 04/20/2009  . Hyperlipidemia LDL goal  <100 05/16/2008  . Essential hypertension 05/16/2008  . OSTEOPENIA 05/16/2008  . Urinary incontinence 05/16/2008   11:58 AM,01/26/2016 Marylyn Ishihara PT, DPT [name] [phone]  [facility] [address] [place], [place], [address] Phone: [phone]   Fax:  [phone]  Name: KRISTILYN COLTRANE MRN: [id number] Date of Birth: 07/12/1951

## 2016-01-28 ENCOUNTER — Ambulatory Visit (HOSPITAL_COMMUNITY): Payer: Medicare Other | Admitting: Physical Therapy

## 2016-01-28 DIAGNOSIS — M25562 Pain in left knee: Secondary | ICD-10-CM

## 2016-01-28 DIAGNOSIS — M24662 Ankylosis, left knee: Secondary | ICD-10-CM | POA: Diagnosis not present

## 2016-01-28 DIAGNOSIS — S83207S Unspecified tear of unspecified meniscus, current injury, left knee, sequela: Secondary | ICD-10-CM

## 2016-01-28 DIAGNOSIS — R29898 Other symptoms and signs involving the musculoskeletal system: Secondary | ICD-10-CM | POA: Diagnosis not present

## 2016-01-28 NOTE — Therapy (Signed)
[place] [facility] [address] [place], [place], [address] Phone: [phone]   Fax:  [phone]  Physical Therapy Treatment  Patient Details  Name: Laura [name] MRN: [id number] Date of Birth: [birth year] Referring Provider: Beverely Low, MD  Encounter Date: 01/28/2016      PT End of Session - 01/28/16 1248    Visit Number 6   Number of Visits 12   Date for PT Re-Evaluation 03/08/16   Authorization Type Medicare   Authorization Time Period G-codes by 9th visit   Authorization - Visit Number 6   Authorization - Number of Visits 12   PT Start Time 1015   PT Stop Time 1057   PT Time Calculation (min) 42 min   Equipment Utilized During Treatment --   Activity Tolerance Patient tolerated treatment well   Behavior During Therapy [facility] for tasks assessed/performed      Past Medical History  Diagnosis Date  . GERD (gastroesophageal reflux disease)   . Gastritis   . Hypertension   . Hyperlipidemia   . Anemia   . Cystitis   . Osteopenia   . Shoulder pain   . Hypothyroidism   . Hashimoto's thyroiditis     Hx   . Urinary incontinence   . Anxiety   . Depression     Past Surgical History  Procedure Laterality Date  . Total abdominal hysterectomy  1994  . Umbilical hernia repair    . Breast excisional biopsy  2010    Excisional biopsy of benign left breast mass -lopoma   . Resection of left lobe of thyroid    . Bilateral tubal ligation    . Right carpal tunnel release    . Urethral dilation for stenosis  2009  . Rt. knee athroscopy  2004  . Colonscopy  2005    Dr. Katrinka Blazing  . Esophagogastroduodenoscopy  12/2009    chronic gastritis  . Breast surgery      left nreast biopsy  . Colonoscopy  08/26/2011    SLF: 1. Internal hemorrhoids  . Colonoscopy N/A 10/06/2015    Procedure: COLONOSCOPY;  Surgeon: West Bali, MD;  [facility];  Service: Endoscopy;  Laterality: N/A;  1315     There were no vitals filed for this  visit.  Visit Diagnosis:  Decreased range of motion of knee, left  Knee pain, acute, left  Decreased strength involving knee joint  Meniscus tear, left, sequela      Subjective Assessment - 01/28/16 1019    Subjective pt reports she is feeling stiff today and attributes this to the weather. She has no pain currently. Also notes she was able to do all of her exercises at home yesterday.   Currently in Pain? No/denies                         [facility] Adult PT Treatment/Exercise - 01/28/16 0001    Knee/Hip Exercises: Stretches   Gastroc Stretch 3 reps;30 seconds   Gastroc Stretch Limitations seated with strap    Knee/Hip Exercises: Standing   Other Standing Knee Exercises retrostepping with BUE support 2x20   Knee/Hip Exercises: Seated   Long Arc Quad Left;2 sets;10 reps   Long Arc Quad Limitations red TB   Sit to [employer] --   Knee/Hip Exercises: Supine   Bridges 10 reps;2 sets   Straight Leg Raises --   Knee/Hip Exercises: Prone   Hamstring Curl 1 set;10 reps  Hamstring Curl Limitations red TB   Manual Therapy   Manual Therapy Joint mobilization;Soft tissue mobilization;Passive ROM   Manual therapy comments performed seperately from all other interventions   Joint Mobilization grade I-IV PA tibiofemoral jt mobs and patellat mobs I<>S   Soft tissue mobilization palmar spreading to distal quad x6 min   Passive ROM knee flexion/extension with 5 sec holds                PT Education - 01/28/16 1247    Education provided Yes   Education Details updated HEP   Person(s) Educated Patient   Methods Explanation;Handout   Comprehension Verbalized understanding;Need further instruction          PT Short Term Goals - 01/12/16 1333    PT SHORT TERM GOAL #1   Title Pt to be independent with HEP for Lt knee ROM and strengthening   Time 3   Period Weeks   Status New   PT SHORT TERM GOAL #2   Title Pt to have 0 degrees on Lt knee extension for gait  stability.   Time 3   Period Weeks   Status New   PT SHORT TERM GOAL #3   Title Pt to have 115 degrees of lt knee flexion for driving tasks.    Time 3   Period Weeks   Status New   PT SHORT TERM GOAL #4   Title Pt to ambulate community distances without assistive device.    Time 3   Period Weeks   Status New           PT Long Term Goals - 01/12/16 1337    PT LONG TERM GOAL #1   Title Pt to be independent with a progressive HEP for continuation of PT gains upon D/C.    Time 6   Period Weeks   Status New   PT LONG TERM GOAL #2   Title Patient to have greater or equal to 120 degrees of lt knee flexion for sitting on low surfaces.    Time 6   Period Weeks   Status New   PT LONG TERM GOAL #3   Title Patient to have 4+/5 strength with Lt knee flexion and extension for functional tasks.     Time 6   Period Weeks   Status New   PT LONG TERM GOAL #4   Title Patient to have FOTO score of 40% restriction or less.    Time 6   Period Weeks   Status New   PT LONG TERM GOAL #5   Title Pt to ambulate up/down 4 steps with hand rail with alternating steps.    Time 6   Period Weeks   Status New               Plan - 01/28/16 1249    Clinical Impression Statement Pt's pain is decreasing and her strength continues to improve evident by her ability to increase reps and resistance with therex during her sessions. Pt session focused on both open and closed chain strengthening activity. Her HEP was updated this session, with pt verbalizing full understanding at this time. Will continue current POC.    Pt will benefit from skilled therapeutic intervention in order to improve on the following deficits Abnormal gait;Decreased activity tolerance;Decreased balance;Pain;Decreased range of motion;Impaired flexibility;Difficulty walking;Decreased strength   Rehab Potential Good   PT Frequency 2x / week   PT Duration 6 weeks   PT Treatment/Interventions ADLs/Self Care Home  Management;Cryotherapy;Electrical Stimulation;Moist Heat;Ultrasound;Patient/family education;Neuromuscular re-education;Therapeutic exercise;Balance training;Therapeutic activities;Functional mobility training;Stair training;Gait training;Manual techniques;Taping   PT Next Visit Plan continue with manual therapy to address pain and knee extension, progress LE therex as tolerated. Progress strengthening and ROM through Lt LE. Review and modify HEP as needed. Gait training for symmetrical step/stride without AD   PT Home Exercise Plan updated HEP: bridge, hip abd against wall, knee flexion/extension stretch, prone hamstring curls, LAQ, neural gliding for LLE.   Consulted and Agree with Plan of Care Patient        Problem List Patient Active Problem List   Diagnosis Date Noted  . Acute cystitis with hematuria 11/21/2015  . Preoperative examination 11/20/2015  . Need for 23-polyvalent pneumococcal polysaccharide vaccine 11/20/2015  . Change in bowel habits 09/16/2015  . Rectal bleeding 09/16/2015  . Knee pain, left 09/08/2015  . Allergic rhinitis 10/08/2014  . GAD (generalized anxiety disorder) 09/23/2014  . POSTSURGICAL HYPOTHYROIDISM 06/22/2010  . CARPAL TUNNEL SYNDROME, BILATERAL 10/22/2009  . Anemia 08/04/2009  . GERD 04/20/2009  . Hyperlipidemia LDL goal <100 05/16/2008  . Essential hypertension 05/16/2008  . OSTEOPENIA 05/16/2008  . Urinary incontinence 05/16/2008   2:19 PM,01/28/2016 Marylyn Ishihara PT, DPT [name] [phone]  [facility] [address] [place], [place], [address] Phone: [phone]   Fax:  [phone]  Name: Laura [name] MRN: [id number] Date of Birth: 07-02-1951

## 2016-02-02 ENCOUNTER — Ambulatory Visit (HOSPITAL_COMMUNITY): Payer: Medicare Other | Attending: Family Medicine | Admitting: Physical Therapy

## 2016-02-02 DIAGNOSIS — R29898 Other symptoms and signs involving the musculoskeletal system: Secondary | ICD-10-CM | POA: Diagnosis not present

## 2016-02-02 DIAGNOSIS — M6281 Muscle weakness (generalized): Secondary | ICD-10-CM | POA: Diagnosis not present

## 2016-02-02 DIAGNOSIS — S83207S Unspecified tear of unspecified meniscus, current injury, left knee, sequela: Secondary | ICD-10-CM | POA: Diagnosis not present

## 2016-02-02 DIAGNOSIS — M24662 Ankylosis, left knee: Secondary | ICD-10-CM | POA: Insufficient documentation

## 2016-02-02 DIAGNOSIS — M25562 Pain in left knee: Secondary | ICD-10-CM | POA: Diagnosis not present

## 2016-02-02 DIAGNOSIS — M25662 Stiffness of left knee, not elsewhere classified: Secondary | ICD-10-CM | POA: Diagnosis not present

## 2016-02-02 DIAGNOSIS — X58XXXS Exposure to other specified factors, sequela: Secondary | ICD-10-CM | POA: Insufficient documentation

## 2016-02-02 NOTE — Therapy (Signed)
[facility] [address] [place], [place], [address] Phone: [phone]   Fax:  [phone]  Physical Therapy Treatment  Patient Details  Name: Laura [name] MRN: [id number] Date of Birth: Jun 14, 1951 Referring Provider: Beverely Low, MD  Encounter Date: 02/02/2016      PT End of Session - 02/02/16 0945    Visit Number 7   Number of Visits 12   Date for PT Re-Evaluation 03/08/16   Authorization Type Medicare   Authorization Time Period G-codes by 9th visit   Authorization - Visit Number 7   Authorization - Number of Visits 12   PT Start Time [phone]   PT Stop Time 1015   PT Time Calculation (min) 41 min   Activity Tolerance Patient tolerated treatment well   Behavior During Therapy [facility] for tasks assessed/performed      Past Medical History  Diagnosis Date  . GERD (gastroesophageal reflux disease)   . Gastritis   . Hypertension   . Hyperlipidemia   . Anemia   . Cystitis   . Osteopenia   . Shoulder pain   . Hypothyroidism   . Hashimoto's thyroiditis     Hx   . Urinary incontinence   . Anxiety   . Depression     Past Surgical History  Procedure Laterality Date  . Total abdominal hysterectomy  1994  . Umbilical hernia repair    . Breast excisional biopsy  2010    Excisional biopsy of benign left breast mass -lopoma   . Resection of left lobe of thyroid    . Bilateral tubal ligation    . Right carpal tunnel release    . Urethral dilation for stenosis  2009  . Rt. knee athroscopy  2004  . Colonscopy  2005    Dr. Katrinka Blazing  . Esophagogastroduodenoscopy  12/2009    chronic gastritis  . Breast surgery      left nreast biopsy  . Colonoscopy  08/26/2011    SLF: 1. Internal hemorrhoids  . Colonoscopy N/A 10/06/2015    Procedure: COLONOSCOPY;  Surgeon: West Bali, MD;  Location: [facility];  Service: Endoscopy;  Laterality: N/A;  1315     There were no vitals filed for this visit.  Visit Diagnosis:  Decreased range  of motion of knee, left  Knee pain, acute, left  Decreased strength involving knee joint  Meniscus tear, left, sequela      Subjective Assessment - 02/02/16 0940    Subjective Pt states she is doing well today.  Reports some aggrevation in her Lt knee but no real pain. States her knee is really stiff in the mornings.   Currently in Pain? No/denies                         [facility] Adult PT Treatment/Exercise - 02/02/16 0942    Knee/Hip Exercises: Stretches   Active Hamstring Stretch Both;2 reps;30 seconds   Active Hamstring Stretch Limitations 6" box   Knee: Self-Stretch to increase Flexion Left;10 seconds   Knee: Self-Stretch Limitations 10 reps   Gastroc Stretch 3 reps;30 seconds   Gastroc Stretch Limitations slantboard   Knee/Hip Exercises: Aerobic   Recumbent Bike 5 minutes seat 9   Knee/Hip Exercises: Standing   Stairs 2RT 4" and 7" sides reciprocally with 1 HR   Manual Therapy   Manual Therapy Joint mobilization;Soft tissue mobilization;Passive ROM;Myofascial release   Manual therapy comments performed seperately from all other interventions  Joint Mobilization grade I-IV PA tibiofemoral jt mobs and patellat mobs I<>S   Soft tissue mobilization Lt knee to increase mobility of tissue   Myofascial Release to anterior and posterior knee to decrease adhesions   Passive ROM knee flexion/extension with 5 sec holds                  PT Short Term Goals - 01/12/16 1333    PT SHORT TERM GOAL #1   Title Pt to be independent with HEP for Lt knee ROM and strengthening   Time 3   Period Weeks   Status New   PT SHORT TERM GOAL #2   Title Pt to have 0 degrees on Lt knee extension for gait stability.   Time 3   Period Weeks   Status New   PT SHORT TERM GOAL #3   Title Pt to have 115 degrees of lt knee flexion for driving tasks.    Time 3   Period Weeks   Status New   PT SHORT TERM GOAL #4   Title Pt to ambulate community distances without assistive  device.    Time 3   Period Weeks   Status New           PT Long Term Goals - 01/12/16 1337    PT LONG TERM GOAL #1   Title Pt to be independent with a progressive HEP for continuation of PT gains upon D/C.    Time 6   Period Weeks   Status New   PT LONG TERM GOAL #2   Title Patient to have greater or equal to 120 degrees of lt knee flexion for sitting on low surfaces.    Time 6   Period Weeks   Status New   PT LONG TERM GOAL #3   Title Patient to have 4+/5 strength with Lt knee flexion and extension for functional tasks.     Time 6   Period Weeks   Status New   PT LONG TERM GOAL #4   Title Patient to have FOTO score of 40% restriction or less.    Time 6   Period Weeks   Status New   PT LONG TERM GOAL #5   Title Pt to ambulate up/down 4 steps with hand rail with alternating steps.    Time 6   Period Weeks   Status New               Plan - 02/02/16 1142    Clinical Impression Statement Continued focus on improving functional strength and ROM in Rt knee.  Progressed reps and sets where able and added self stretch onto 12" box.  Also instructed in alternate way to complete hamstring stretch in standing using 6" box.  Noted extreme tightness in hamstrings with inabiltiy to complete on any highter surface at this point.  Able to negotiate stairs up tot 7" height reciprocally, ascending and descending, also without difficulty.  Myofascial completed anteriorly and posteriorly  to decrease adhesions.  Most tightness noted inferiorly.  Pt reported overall improvement at end of session  Continues to be hesitant to straighten knee, thus walking on toes at time.  Instructed to complete heel to toe gait.    Pt will benefit from skilled therapeutic intervention in order to improve on the following deficits Abnormal gait;Decreased activity tolerance;Decreased balance;Pain;Decreased range of motion;Impaired flexibility;Difficulty walking;Decreased strength   Rehab Potential Good   PT  Frequency 2x / week   PT Duration 6 weeks  PT Treatment/Interventions ADLs/Self Care Home Management;Cryotherapy;Electrical Stimulation;Moist Heat;Ultrasound;Patient/family education;Neuromuscular re-education;Therapeutic exercise;Balance training;Therapeutic activities;Functional mobility training;Stair training;Gait training;Manual techniques;Taping   PT Next Visit Plan continue with manual therapy to address pain and knee extension, progress LE therex as tolerated. Progress strengthening and ROM through Lt LE. Review and modify HEP as needed. Gait training for symmetrical step/stride without AD   Consulted and Agree with Plan of Care Patient        Problem List Patient Active Problem List   Diagnosis Date Noted  . Acute cystitis with hematuria 11/21/2015  . Preoperative examination 11/20/2015  . Need for 23-polyvalent pneumococcal polysaccharide vaccine 11/20/2015  . Change in bowel habits 09/16/2015  . Rectal bleeding 09/16/2015  . Knee pain, left 09/08/2015  . Allergic rhinitis 10/08/2014  . GAD (generalized anxiety disorder) 09/23/2014  . POSTSURGICAL HYPOTHYROIDISM 06/22/2010  . CARPAL TUNNEL SYNDROME, BILATERAL 10/22/2009  . Anemia 08/04/2009  . GERD 04/20/2009  . Hyperlipidemia LDL goal <100 05/16/2008  . Essential hypertension 05/16/2008  . OSTEOPENIA 05/16/2008  . Urinary incontinence 05/16/2008    Lurena Nidamy B Benton Tooker, PTA/CLT [phone]  02/02/2016, 11:56 AM  [facility] [address] [place], [place], [address] Phone: [phone]   Fax:  [phone]  Name: Laura [name] MRN: [id number] Date of Birth: Aug 03, 1951

## 2016-02-04 ENCOUNTER — Encounter (HOSPITAL_COMMUNITY): Payer: Medicare Other

## 2016-02-04 ENCOUNTER — Ambulatory Visit (HOSPITAL_COMMUNITY): Payer: Medicare Other | Admitting: Physical Therapy

## 2016-02-04 ENCOUNTER — Ambulatory Visit: Payer: Medicare Other | Admitting: Gastroenterology

## 2016-02-04 DIAGNOSIS — S83242D Other tear of medial meniscus, current injury, left knee, subsequent encounter: Secondary | ICD-10-CM | POA: Diagnosis not present

## 2016-02-04 DIAGNOSIS — M25562 Pain in left knee: Secondary | ICD-10-CM | POA: Diagnosis not present

## 2016-02-04 DIAGNOSIS — M24662 Ankylosis, left knee: Secondary | ICD-10-CM

## 2016-02-04 DIAGNOSIS — S83207S Unspecified tear of unspecified meniscus, current injury, left knee, sequela: Secondary | ICD-10-CM

## 2016-02-04 DIAGNOSIS — R29898 Other symptoms and signs involving the musculoskeletal system: Secondary | ICD-10-CM

## 2016-02-04 DIAGNOSIS — M25662 Stiffness of left knee, not elsewhere classified: Secondary | ICD-10-CM | POA: Diagnosis not present

## 2016-02-04 DIAGNOSIS — M6281 Muscle weakness (generalized): Secondary | ICD-10-CM | POA: Diagnosis not present

## 2016-02-04 DIAGNOSIS — M25561 Pain in right knee: Secondary | ICD-10-CM | POA: Diagnosis not present

## 2016-02-04 NOTE — Therapy (Signed)
[facility] [address] [place], [place], [address] Phone: [phone]   Fax:  [phone]  Physical Therapy Treatment  Patient Details  Name: Laura [name] MRN: [id number] Date of Birth: Mar 10, 1951 Referring Provider: Beverely Low, MD  Encounter Date: 02/04/2016      PT End of Session - 02/04/16 1815    Visit Number 8   Number of Visits 12   Date for PT Re-Evaluation 03/08/16   Authorization Type Medicare   Authorization Time Period G-codes by 9th visit   Authorization - Visit Number 8   Authorization - Number of Visits 12   PT Start Time 0530   PT Stop Time 0616   PT Time Calculation (min) 46 min   Activity Tolerance Patient tolerated treatment well   Behavior During Therapy [facility] for tasks assessed/performed      Past Medical History  Diagnosis Date  . GERD (gastroesophageal reflux disease)   . Gastritis   . Hypertension   . Hyperlipidemia   . Anemia   . Cystitis   . Osteopenia   . Shoulder pain   . Hypothyroidism   . Hashimoto's thyroiditis     Hx   . Urinary incontinence   . Anxiety   . Depression     Past Surgical History  Procedure Laterality Date  . Total abdominal hysterectomy  1994  . Umbilical hernia repair    . Breast excisional biopsy  2010    Excisional biopsy of benign left breast mass -lopoma   . Resection of left lobe of thyroid    . Bilateral tubal ligation    . Right carpal tunnel release    . Urethral dilation for stenosis  2009  . Rt. knee athroscopy  2004  . Colonscopy  2005    Dr. Katrinka Blazing  . Esophagogastroduodenoscopy  12/2009    chronic gastritis  . Breast surgery      left nreast biopsy  . Colonoscopy  08/26/2011    SLF: 1. Internal hemorrhoids  . Colonoscopy N/A 10/06/2015    OZD:GUYQ fissure or internal hemorrhoids/mild sigmoid colitis    There were no vitals filed for this visit.      Subjective Assessment - 02/04/16 1737    Subjective Pt states she is doing well today.   Reports she went to Dr Ranell Patrick and says he drained some fluid from her knee. No pain currently, but notes she still walks on her toes.   Currently in Pain? No/denies                         [facility] Adult PT Treatment/Exercise - 02/04/16 0001    Ambulation/Gait   Ambulation/Gait Yes   Ambulation/Gait Assistance 7: Independent   Ambulation Distance (Feet) 226 Feet   Assistive device None   Gait Pattern Step-through pattern;Left flexed knee in stance   Ambulation Surface Level   Pre-Gait Activities RLE step over short hurdle x3 RT   Gait Comments minimal verb cues for initial heel contact   Knee/Hip Exercises: Stretches   Gastroc Stretch 3 reps;30 seconds;Left   Gastroc Stretch Limitations standing   Knee/Hip Exercises: Aerobic   Recumbent Bike 5 minutes seat 9   Knee/Hip Exercises: Standing   Wall Squat 10 reps;2 sets  cues needed for maintaining equal wt shift   Wall Squat Limitations (+) wt shift Rt   Other Standing Knee Exercises retrostepping 2x40 ft, SBA   Knee/Hip Exercises: Seated   Long Arc  Quad Left;2 sets;15 reps   Long [employer] Weight 5 lbs.   Manual Therapy   Manual Therapy Joint mobilization   Manual therapy comments performed seperately from all other interventions   Joint Mobilization grade I-IV PA tibiofemoral jt mobs and grade I-IV AP femur on tibia CKC mobs   Passive ROM knee flexion/extension with 5 sec holds                PT Education - 02/04/16 1814    Education provided Yes   Education Details updated HEP; discussed importance of heel contact during gait   Person(s) Educated Patient   Methods Explanation;Demonstration   Comprehension Verbalized understanding;Returned demonstration          PT Short Term Goals - 01/12/16 1333    PT SHORT TERM GOAL #1   Title Pt to be independent with HEP for Lt knee ROM and strengthening   Time 3   Period Weeks   Status New   PT SHORT TERM GOAL #2   Title Pt to have 0 degrees on Lt knee  extension for gait stability.   Time 3   Period Weeks   Status New   PT SHORT TERM GOAL #3   Title Pt to have 115 degrees of lt knee flexion for driving tasks.    Time 3   Period Weeks   Status New   PT SHORT TERM GOAL #4   Title Pt to ambulate community distances without assistive device.    Time 3   Period Weeks   Status New           PT Long Term Goals - 01/12/16 1337    PT LONG TERM GOAL #1   Title Pt to be independent with a progressive HEP for continuation of PT gains upon D/C.    Time 6   Period Weeks   Status New   PT LONG TERM GOAL #2   Title Patient to have greater or equal to 120 degrees of lt knee flexion for sitting on low surfaces.    Time 6   Period Weeks   Status New   PT LONG TERM GOAL #3   Title Patient to have 4+/5 strength with Lt knee flexion and extension for functional tasks.     Time 6   Period Weeks   Status New   PT LONG TERM GOAL #4   Title Patient to have FOTO score of 40% restriction or less.    Time 6   Period Weeks   Status New   PT LONG TERM GOAL #5   Title Pt to ambulate up/down 4 steps with hand rail with alternating steps.    Time 6   Period Weeks   Status New               Plan - 02/04/16 1816    Clinical Impression Statement Pt demonstrating improved heel toe sequencing this session with minimal verbal cues. She continues to present with limited terminal knee extension ROM primarily in closed chain positions. Pt ambulated around gym without SPC with good control and without noted gait deviations. Therapist encouraged her to continue to focus on proper gait at home without her [facility] at this time. Will continue with current POC.    Rehab Potential Good   PT Frequency 2x / week   PT Duration 6 weeks   PT Treatment/Interventions ADLs/Self Care Home Management;Cryotherapy;Electrical Stimulation;Moist Heat;Ultrasound;Patient/family education;Neuromuscular re-education;Therapeutic exercise;Balance training;Therapeutic  activities;Functional mobility training;Stair training;Gait training;Manual techniques;Taping  PT Next Visit Plan continue with tibfemoral jt mobs to address terminal knee extension, progress functional LE strengthening as tolerated. Review and modify HEP as needed. Gait training for symmetrical step/stride without AD if necessary.   PT Home Exercise Plan no updates this session   Consulted and Agree with Plan of Care Patient      Patient will benefit from skilled therapeutic intervention in order to improve the following deficits and impairments:  Abnormal gait, Decreased activity tolerance, Decreased balance, Pain, Decreased range of motion, Impaired flexibility, Difficulty walking, Decreased strength  Visit Diagnosis: Decreased range of motion of knee, left  Knee pain, acute, left  Decreased strength involving knee joint  Meniscus tear, left, sequela     Problem List Patient Active Problem List   Diagnosis Date Noted  . Acute cystitis with hematuria 11/21/2015  . Preoperative examination 11/20/2015  . Need for 23-polyvalent pneumococcal polysaccharide vaccine 11/20/2015  . Change in bowel habits 09/16/2015  . Rectal bleeding 09/16/2015  . Knee pain, left 09/08/2015  . Allergic rhinitis 10/08/2014  . GAD (generalized anxiety disorder) 09/23/2014  . POSTSURGICAL HYPOTHYROIDISM 06/22/2010  . CARPAL TUNNEL SYNDROME, BILATERAL 10/22/2009  . Anemia 08/04/2009  . GERD 04/20/2009  . Hyperlipidemia LDL goal <100 05/16/2008  . Essential hypertension 05/16/2008  . OSTEOPENIA 05/16/2008  . Urinary incontinence 05/16/2008    6:28 PM,02/04/2016 Marylyn IshiharaSara Kiser PT, DPT [facility] [phone]  [facility] [address] [place], [place], [address] Phone: [phone]   Fax:  [phone]  Name: Laura [name] MRN: [id number] Date of Birth: Aug 10, 1951

## 2016-02-05 ENCOUNTER — Ambulatory Visit (INDEPENDENT_AMBULATORY_CARE_PROVIDER_SITE_OTHER): Payer: Medicare Other | Admitting: Gastroenterology

## 2016-02-05 ENCOUNTER — Encounter: Payer: Self-pay | Admitting: Gastroenterology

## 2016-02-05 VITALS — BP 116/71 | HR 86 | Temp 97.8°F | Ht 65.0 in | Wt 176.4 lb

## 2016-02-05 DIAGNOSIS — K5900 Constipation, unspecified: Secondary | ICD-10-CM

## 2016-02-05 NOTE — Patient Instructions (Signed)
Take Linzess 145 mcg each morning on an empty stomach, 30 minutes before breakfast.   I am glad you are doing so well!  We will see you in 6 months!

## 2016-02-05 NOTE — Progress Notes (Signed)
Referring Provider: Kerri PerchesSimpson, Margaret E, MD Primary Care Physician:  [name], MD  Primary GI: Dr. Darrick PennaFields   Chief Complaint  Patient presents with  . Follow-up    HPI:   [name] is a 65 y.o. female presenting today in follow-up after colonoscopy. Likely internal hemorrhoids vs anal fissure as culprit for rectal bleeding. Doing great with Linzess.   Needs Linzess samples. Would like to cut back down to the 145. 290 too strong. No abdominal pain. No rectal bleeding. Good appetite. No N/V. Not on a stool softener anymore. Rectal discomfort improved.   Past Medical History  Diagnosis Date  . GERD (gastroesophageal reflux disease)   . Gastritis   . Hypertension   . Hyperlipidemia   . Anemia   . Cystitis   . Osteopenia   . Shoulder pain   . Hypothyroidism   . Hashimoto's thyroiditis     Hx   . Urinary incontinence   . Anxiety   . Depression     Past Surgical History  Procedure Laterality Date  . Total abdominal hysterectomy  1994  . Umbilical hernia repair    . Breast excisional biopsy  2010    Excisional biopsy of benign left breast mass -lopoma   . Resection of left lobe of thyroid    . Bilateral tubal ligation    . Right carpal tunnel release    . Urethral dilation for stenosis  2009  . Rt. knee athroscopy  2004  . Colonscopy  2005    Dr. Katrinka BlazingSmith  . Esophagogastroduodenoscopy  12/2009    chronic gastritis  . Breast surgery      left nreast biopsy  . Colonoscopy  08/26/2011    SLF: 1. Internal hemorrhoids  . Colonoscopy N/A 10/06/2015    ZOX:WRUESLF:anal fissure or internal hemorrhoids/mild sigmoid colitis. benign colonic path   . Left knee surgery  March 2017    Current Outpatient Prescriptions  Medication Sig Dispense Refill  . citalopram (CELEXA) 10 MG tablet Take 1 tablet (10 mg total) by mouth daily. 30 tablet 5  . docusate sodium (COLACE) 100 MG capsule Take 100 mg by mouth daily.    . hydrochlorothiazide (HYDRODIURIL) 25 MG tablet TAKE 1  TABLET BY MOUTH EVERY DAY 30 tablet 3  . imipramine (TOFRANIL) 25 MG tablet Take 1 tablet (25 mg total) by mouth at bedtime. 90 tablet 1  . levothyroxine (SYNTHROID, LEVOTHROID) 100 MCG tablet Take 1 tablet (100 mcg total) by mouth daily before breakfast. 30 tablet 6  . Linaclotide (LINZESS) 145 MCG CAPS capsule Take 145 mcg by mouth daily.    Marland Kitchen. losartan (COZAAR) 25 MG tablet Take 1 tablet (25 mg total) by mouth daily. 30 tablet 5  . lovastatin (MEVACOR) 40 MG tablet TAKE 2 TABLETS BY MOUTH EVERY NIGHT AT BEDTIME FOR CHOLESTEROL 60 tablet 5  . methocarbamol (ROBAXIN) 500 MG tablet Take 500 mg by mouth 4 (four) times daily.    . Multiple Vitamins-Minerals (HAIR/SKIN/NAILS) TABS Take 1 tablet by mouth.    . oxyCODONE-acetaminophen (PERCOCET/ROXICET) 5-325 MG tablet Take 1 tablet by mouth every 4 (four) hours as needed for severe pain.    . pantoprazole (PROTONIX) 40 MG tablet TAKE 1 TABLET BY MOUTH TWICE DAILY 60 tablet 4  . potassium chloride (K-DUR,KLOR-CON) 10 MEQ tablet TAKE 1 TABLET BY MOUTH TWICE DAILY 180 tablet 0  . vitamin E 400 UNIT capsule Take 400 Units by mouth daily.    Marland Kitchen. imipramine (TOFRANIL) 25  MG tablet TAKE 1 TABLET BY MOUTH EVERY NIGHT AT BEDTIME (Patient not taking: Reported on 02/05/2016) 90 tablet 0   No current facility-administered medications for this visit.    Allergies as of 02/05/2016 - Review Complete 02/05/2016  Allergen Reaction Noted  . Dilaudid [hydromorphone hcl]  03/01/2011  . Levaquin [levofloxacin] Other (See Comments) 07/28/2014  . Nsaids  01/13/2010    Family History  Problem Relation Age of Onset  . Colon cancer Neg Hx   . Anesthesia problems Neg Hx   . Hypotension Neg Hx   . Malignant hyperthermia Neg Hx   . Pseudochol deficiency Neg Hx     Social History   Social History  . Marital Status: Married    Spouse Name: N/A  . Number of Children: 3  . Years of Education: N/A   Occupational History  . 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 Smoking status: Former Smoker -- 0.50 packs/day    Types: Cigarettes    Quit date: 08/25/1972  . Smokeless tobacco: None  . Alcohol Use: No  . Drug Use: No  . Sexual Activity: Not Currently   Other Topics Concern  . None   Social History Narrative    Review of Systems: As mentioned in HPI.   Physical Exam: BP 116/71 mmHg  Pulse 86  Temp(Src) 97.8 F (36.6 C) (Oral)  Ht  (1.651 m)  Wt 176 lb 6.4 oz (80.015 kg)  BMI 29.35 kg/m2 General:   Alert and oriented. No distress noted. Pleasant and cooperative.  Head:  Normocephalic and atraumatic. Eyes:  Conjuctiva clear without scleral icterus. Abdomen:  +BS, soft, non-tender and non-distended. No rebound or guarding. No HSM or masses noted. Msk:  Symmetrical without gross deformities. Normal posture. Extremities:  Without edema. Neurologic:  Alert and  oriented x4;  grossly normal neurologically. Psych:  Alert and cooperative. Normal mood and affect.

## 2016-02-05 NOTE — Assessment & Plan Note (Signed)
Doing well with Linzess, with Linzess 290 actually too strong. Will decrease to Linzess 145 mcg daily. Rectal discomfort and rectal bleeding resolved with Rectiv. Return in 6 months.

## 2016-02-08 NOTE — Progress Notes (Signed)
CC'ED TO PCP 

## 2016-02-09 ENCOUNTER — Ambulatory Visit (HOSPITAL_COMMUNITY): Payer: Medicare Other | Admitting: Physical Therapy

## 2016-02-09 DIAGNOSIS — M25562 Pain in left knee: Secondary | ICD-10-CM

## 2016-02-09 DIAGNOSIS — M24662 Ankylosis, left knee: Secondary | ICD-10-CM | POA: Diagnosis not present

## 2016-02-09 DIAGNOSIS — R29898 Other symptoms and signs involving the musculoskeletal system: Secondary | ICD-10-CM | POA: Diagnosis not present

## 2016-02-09 DIAGNOSIS — M25662 Stiffness of left knee, not elsewhere classified: Secondary | ICD-10-CM | POA: Diagnosis not present

## 2016-02-09 DIAGNOSIS — S83207S Unspecified tear of unspecified meniscus, current injury, left knee, sequela: Secondary | ICD-10-CM | POA: Diagnosis not present

## 2016-02-09 DIAGNOSIS — M6281 Muscle weakness (generalized): Secondary | ICD-10-CM | POA: Diagnosis not present

## 2016-02-09 NOTE — Therapy (Signed)
[place], [place], [address] Phone: [phone]   Fax:  [phone]  Physical Therapy Treatment (Re-Assess)  Patient Details  Name: Laura [name] MRN: [id number] Date of Birth: 09-10-1951 Referring Provider: Netta Cedars, MD  Encounter Date: 02/09/2016      PT End of Session - 02/09/16 0942    Visit Number 9   Number of Visits 17   Date for PT Re-Evaluation 03/08/16   Authorization Type Medicare   Authorization Time Period G-codes done  December 11, 2022 visit   Authorization - Visit Number 9   Authorization - Number of Visits 19   PT Start Time 0900   PT Stop Time 0934  8 minutes unsupervised on bike    PT Time Calculation (min) 34 min   Activity Tolerance Patient tolerated treatment well   Behavior During Therapy [facility] for tasks assessed/performed      Past Medical History  Diagnosis Date  . GERD (gastroesophageal reflux disease)   . Gastritis   . Hypertension   . Hyperlipidemia   . Anemia   . Cystitis   . Osteopenia   . Shoulder pain   . Hypothyroidism   . Hashimoto's thyroiditis     Hx   . Urinary incontinence   . Anxiety   . Depression     Past Surgical History  Procedure Laterality Date  . Total abdominal hysterectomy  1994  . Umbilical hernia repair    . Breast excisional biopsy  2010    Excisional biopsy of benign left breast mass -lopoma   . Resection of left lobe of thyroid    . Bilateral tubal ligation    . Right carpal tunnel release    . Urethral dilation for stenosis  2009  . Rt. knee athroscopy  2004  . Colonscopy  2005    Dr. Tamala Julian  . Esophagogastroduodenoscopy  12/2009    chronic gastritis  . Breast surgery      left nreast biopsy  . Colonoscopy  08/26/2011    SLF: 1. Internal hemorrhoids  . Colonoscopy N/A 10/06/2015    [facility]:YIFO fissure or internal hemorrhoids/mild sigmoid colitis. benign colonic path   . Left knee surgery  March 2017    There were no vitals filed for this  visit.      Subjective Assessment - 02/09/16 0903    Subjective Patient arrives reporting she is doing steps well but is concerned about the edema that continues to build up in her knee. She feels she is doing better stretching her legs, wants her ROM to be on point. 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 Pressure in the knee especially with flexion.    How long can you sit comfortably? 2 hours    How long can you stand comfortably? 2 hours    How long can you walk comfortably? 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    Currently in Pain? Yes   Pain Score 3    Pain Location Knee   Pain Orientation Left   Pain Descriptors / Indicators Aching;Dull   Pain Type Chronic pain   Pain Radiating Towards none    Pain Onset More than a month ago   Pain Frequency Constant   Aggravating Factors  walking long distances or standing too long    Pain Relieving Factors unknown    Effect of Pain on  Daily Activities nothing, pushes through the pain             [facility] PT Assessment - 02/09/16 0001    Observation/Other Assessments   Focus on Therapeutic Outcomes (FOTO)  47% limited    AROM   Left Knee Extension 6   Left Knee Flexion 115   Strength   Right Hip Flexion 4/5   Right Hip Extension 3+/5   Right Hip ABduction 3/5  compensation from hip flexors    Left Hip Flexion 4/5   Left Hip Extension 3+/5   Left Hip ABduction 3/5   Right Knee Flexion 5/5   Right Knee Extension 5/5   Left Knee Flexion 4+/5   Left Knee Extension 4+/5   Right Ankle Dorsiflexion 5/5   Left Ankle Dorsiflexion 5/5   Transfers   Five time sit to stand comments  11.55   6 minute walk test results    Aerobic Endurance Distance Walked 621   Endurance additional comments 3MWT    Timed Up and Go Test   Normal TUG (seconds) 7.88                     [facility] Adult PT Treatment/Exercise - 02/09/16 0001    Knee/Hip Exercises: Aerobic    Recumbent Bike 8 minutse on bike, unsupervised    Knee/Hip Exercises: Standing   Functional Squat 1 set;10 reps   Functional Squat Limitations mod cues for form                 PT Education - 02/09/16 0942    Education provided Yes   Education Details progress with skilled PT services, plan of care moving forward, squat form    Person(s) Educated Patient   Methods Explanation;Demonstration   Comprehension Verbalized understanding          PT Short Term Goals - 02/09/16 0924    PT SHORT TERM GOAL #1   Title Pt to be independent with HEP for Lt knee ROM and strengthening   Baseline 4/11- reports she is doing HEP every day    Time 3   Period Weeks   Status Achieved   PT SHORT TERM GOAL #2   Title Pt to have 0 degrees on Lt knee extension for gait stability.   Baseline 4/11- still at 6 degrees extension    Time 3   Period Weeks   Status On-going   PT SHORT TERM GOAL #3   Title Pt to have 115 degrees of lt knee flexion for driving tasks.    Baseline 4/11- 115    Time 3   Period Weeks   Status Achieved   PT SHORT TERM GOAL #4   Title Pt to ambulate community distances without assistive device.    Baseline 4/11- reports that she carries cane with her but really does not use it    Time 3   Period Weeks   Status Partially Met           PT Long Term Goals - 02/09/16 0926    PT LONG TERM GOAL #1   Title Pt to be independent with a progressive HEP for continuation of PT gains upon D/C.    Time 6   Period Weeks   Status On-going   PT LONG TERM GOAL #2   Title Patient to have greater or equal to 120 degrees of lt knee flexion for sitting on low surfaces.    Time 6   Period Weeks  Status On-going   PT LONG TERM GOAL #3   Title Patient to have 4+/5 strength with Lt knee flexion and extension for functional tasks.     Time 6   Period Weeks   Status Achieved   PT LONG TERM GOAL #4   Title Patient to have FOTO score of 40% restriction or less.    Baseline  02-16-23- 47% limited    Time 6   Period Weeks   Status On-going   PT LONG TERM GOAL #5   Title Pt to ambulate up/down 4 steps with hand rail with alternating steps.    Baseline 2023-02-16- able to do so without railings, hwoever some slight fucntional weakness and stiffness    Time 6   Period Weeks   Status Achieved               Plan - Feb 16, 2016 0931    Clinical Impression Statement Re-assessment performed today. Patient doing very well at this point, with improvements in strength and ROM, improvements in gait tolerance and quality, and also demonstrates good balance and 3MWT distance today. However paitent does continue to demosntrate signiificant knee stiffness, as evidenced by extension ROM of 6 degrees and flexion of 115, some localized edema in knee, and significant functional weakness in key hip muscle groups. Patient very pleased with her progress thus far, however at this time she will benefit from extended skilled PT services in order to address remaining functional limitations and to assist her in reaching an optimal level of function.    Rehab Potential Good   PT Frequency 2x / week   PT Duration Other (comment)  5 weeks    PT Treatment/Interventions ADLs/Self Care Home Management;Cryotherapy;Electrical Stimulation;Moist Heat;Ultrasound;Patient/family education;Neuromuscular re-education;Therapeutic exercise;Balance training;Therapeutic activities;Functional mobility training;Stair training;Gait training;Manual techniques;Taping   PT Next Visit Plan continue with tibfemoral jt mobs to address terminal knee extension, progress functional LE strengthening as tolerated with focus on hip musculature. Review and modify HEP as needed. Gait training for symmetrical step/stride without AD  consistently.    PT Home Exercise Plan no updates this session   Consulted and Agree with Plan of Care Patient      Patient will benefit from skilled therapeutic intervention in order to improve the  following deficits and impairments:  Abnormal gait, Decreased activity tolerance, Decreased balance, Pain, Decreased range of motion, Impaired flexibility, Difficulty walking, Decreased strength  Visit Diagnosis: Stiffness of left knee, not elsewhere classified - Plan: PT plan of care cert/re-cert  Pain in left knee - Plan: PT plan of care cert/re-cert  Muscle weakness (generalized) - Plan: PT plan of care cert/re-cert       G-Codes - 16-Feb-2016 0944    Functional Assessment Tool Used FOTO 47% limited    Functional Limitation Mobility: Walking and moving around   Mobility: Walking and Moving Around Current Status [phone]) At least 40 percent but less than 60 percent impaired, limited or restricted   Mobility: Walking and Moving Around Goal Status [phone]) At least 20 percent but less than 40 percent impaired, limited or restricted      Problem List Patient Active Problem List   Diagnosis Date Noted  . Constipation 02/05/2016  . Acute cystitis with hematuria 11/21/2015  . Preoperative examination 11/20/2015  . Need for 23-polyvalent pneumococcal polysaccharide vaccine 11/20/2015  . Change in bowel habits 09/16/2015  . Rectal bleeding 09/16/2015  . Knee pain, left 09/08/2015  . Allergic rhinitis 10/08/2014  . GAD (generalized anxiety disorder) 09/23/2014  . POSTSURGICAL HYPOTHYROIDISM  06/22/2010  . CARPAL TUNNEL SYNDROME, BILATERAL 10/22/2009  . Anemia 08/04/2009  . GERD 04/20/2009  . Hyperlipidemia LDL goal <100 05/16/2008  . Essential hypertension 05/16/2008  . OSTEOPENIA 05/16/2008  . Urinary incontinence 05/16/2008    Deniece Ree PT, DPT [place] [address] [place], [place], [address] Phone: [phone]   Fax:  [phone]  Name: Laura [name] MRN: [id number] Date of Birth: 1951-06-16

## 2016-02-11 ENCOUNTER — Encounter (HOSPITAL_COMMUNITY): Payer: Medicare Other | Admitting: Physical Therapy

## 2016-02-15 ENCOUNTER — Other Ambulatory Visit: Payer: Self-pay | Admitting: "Endocrinology

## 2016-02-15 DIAGNOSIS — E890 Postprocedural hypothyroidism: Secondary | ICD-10-CM | POA: Diagnosis not present

## 2016-02-16 ENCOUNTER — Ambulatory Visit (HOSPITAL_COMMUNITY): Payer: Medicare Other

## 2016-02-16 ENCOUNTER — Encounter (HOSPITAL_COMMUNITY): Payer: Medicare Other

## 2016-02-16 DIAGNOSIS — M25662 Stiffness of left knee, not elsewhere classified: Secondary | ICD-10-CM | POA: Diagnosis not present

## 2016-02-16 DIAGNOSIS — M6281 Muscle weakness (generalized): Secondary | ICD-10-CM | POA: Diagnosis not present

## 2016-02-16 DIAGNOSIS — M24662 Ankylosis, left knee: Secondary | ICD-10-CM | POA: Diagnosis not present

## 2016-02-16 DIAGNOSIS — S83207S Unspecified tear of unspecified meniscus, current injury, left knee, sequela: Secondary | ICD-10-CM | POA: Diagnosis not present

## 2016-02-16 DIAGNOSIS — M25562 Pain in left knee: Secondary | ICD-10-CM

## 2016-02-16 DIAGNOSIS — R29898 Other symptoms and signs involving the musculoskeletal system: Secondary | ICD-10-CM | POA: Diagnosis not present

## 2016-02-16 LAB — T4, FREE: Free T4: 1.3 ng/dL (ref 0.8–1.8)

## 2016-02-16 LAB — TSH: TSH: 0.94 m[IU]/L

## 2016-02-16 NOTE — Therapy (Signed)
[place], [place], [address] Phone: [phone]   Fax:  [phone]  Physical Therapy Treatment  Patient Details  Name: Laura [name] MRN: [id number] Date of Birth: 02-03-1951 Referring Provider: Netta Cedars, MD  Encounter Date: 02/16/2016      PT End of Session - 02/16/16 1744    Visit Number 10   Number of Visits 17   Date for PT Re-Evaluation 03/08/16   Authorization Type Medicare   Authorization Time Period G-codes done  2022/12/17 visit   Authorization - Visit Number 10   Authorization - Number of Visits 19   PT Start Time 5176   PT Stop Time 1823   PT Time Calculation (min) 47 min   Activity Tolerance Patient tolerated treatment well   Behavior During Therapy [facility] for tasks assessed/performed      Past Medical History  Diagnosis Date  . GERD (gastroesophageal reflux disease)   . Gastritis   . Hypertension   . Hyperlipidemia   . Anemia   . Cystitis   . Osteopenia   . Shoulder pain   . Hypothyroidism   . Hashimoto's thyroiditis     Hx   . Urinary incontinence   . Anxiety   . Depression     Past Surgical History  Procedure Laterality Date  . Total abdominal hysterectomy  1994  . Umbilical hernia repair    . Breast excisional biopsy  2010    Excisional biopsy of benign left breast mass -lopoma   . Resection of left lobe of thyroid    . Bilateral tubal ligation    . Right carpal tunnel release    . Urethral dilation for stenosis  2009  . Rt. knee athroscopy  2004  . Colonscopy  2005    Dr. Tamala Julian  . Esophagogastroduodenoscopy  12/2009    chronic gastritis  . Breast surgery      left nreast biopsy  . Colonoscopy  08/26/2011    SLF: 1. Internal hemorrhoids  . Colonoscopy N/A 10/06/2015    [facility]:VPXT fissure or internal hemorrhoids/mild sigmoid colitis. benign colonic path   . Left knee surgery  March 2017    There were no vitals filed for this visit.      Subjective Assessment - 02/16/16  1742    Subjective Pt stated knee continued to feel "naggy" today, reports she had increased swelling the other day.  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    Currently in Pain? Yes   Pain Score 3    Pain Location Knee   Pain Orientation Left   Pain Descriptors / Indicators Nagging   Pain Type Chronic pain   Pain Onset More than a month ago   Pain Frequency Constant   Aggravating Factors  walking long distances or standing too long   Pain Relieving Factors unknown   Effect of Pain on Daily Activities nothing, pushes through the pain                          [facility] Adult PT Treatment/Exercise - 02/16/16 0001    Knee/Hip Exercises: Stretches   Active Hamstring Stretch Both;2 reps;30 seconds   Active Hamstring Stretch Limitations supine   Gastroc Stretch 3 reps;30 seconds   Gastroc Stretch Limitations slant board   Knee/Hip Exercises: Aerobic   Recumbent Bike 6 minutse on bike, unsupervised    Knee/Hip  Exercises: Standing   Terminal Knee Extension Limitations 10x5"   Wall Squat 10 reps;2 sets   Knee/Hip Exercises: Supine   Quad Sets Left;10 reps   Heel Slides Left;10 reps;AROM   Terminal Knee Extension Limitations;15 reps   Terminal Knee Extension Limitations therapist facilitation for proper technique   Bridges 10 reps   Manual Therapy   Manual Therapy Joint mobilization   Manual therapy comments performed seperately from all other interventions   Joint Mobilization grade I-IV PA tibiofemoral jt mobs and grade I-IV AP femur on tibia CKC mobs   Passive ROM knee flexion/extension 3x 30"                  PT Short Term Goals - 02/09/16 0924    PT SHORT TERM GOAL #1   Title Pt to be independent with HEP for Lt knee ROM and strengthening   Baseline 4/11- reports she is doing HEP every day    Time 3   Period Weeks   Status Achieved   PT SHORT TERM GOAL #2   Title Pt to have 0 degrees on Lt knee  extension for gait stability.   Baseline 4/11- still at 6 degrees extension    Time 3   Period Weeks   Status On-going   PT SHORT TERM GOAL #3   Title Pt to have 115 degrees of lt knee flexion for driving tasks.    Baseline 4/11- 115    Time 3   Period Weeks   Status Achieved   PT SHORT TERM GOAL #4   Title Pt to ambulate community distances without assistive device.    Baseline 4/11- reports that she carries cane with her but really does not use it    Time 3   Period Weeks   Status Partially Met           PT Long Term Goals - 02/09/16 0926    PT LONG TERM GOAL #1   Title Pt to be independent with a progressive HEP for continuation of PT gains upon D/C.    Time 6   Period Weeks   Status On-going   PT LONG TERM GOAL #2   Title Patient to have greater or equal to 120 degrees of lt knee flexion for sitting on low surfaces.    Time 6   Period Weeks   Status On-going   PT LONG TERM GOAL #3   Title Patient to have 4+/5 strength with Lt knee flexion and extension for functional tasks.     Time 6   Period Weeks   Status Achieved   PT LONG TERM GOAL #4   Title Patient to have FOTO score of 40% restriction or less.    Baseline 4/11- 47% limited    Time 6   Period Weeks   Status On-going   PT LONG TERM GOAL #5   Title Pt to ambulate up/down 4 steps with hand rail with alternating steps.    Baseline 4/11- able to do so without railings, hwoever some slight fucntional weakness and stiffness    Time 6   Period Weeks   Status Achieved               Plan - 02/16/16 1826    Clinical Impression Statement Session focus on improvng AROM primarly knee extension and functional LE strengthening following findings from reassessment last session.  Continued with manual tibfemoral joint mobs to improve knee extnesion and continued PROM per pt. tolerance.  Added  terminal knee extension exercises to improve extension with therapist facilitation for proper muscle activatin.  Improved  extension to 4 degrees lacking following manual and ROM based exercises and 118 degrees flexion..  Min cueing with gait training to improve stance phase with no AD.  LE strengthening focus on proximal muscuature strengthening with therapist facilitaiton for proper form to reduce compensation (especially abduction and uttilizing hip flexor musculature).  End of session pt reoprts pain free.     Rehab Potential Good   PT Frequency 2x / week   PT Duration Other (comment)  5 weeks   PT Next Visit Plan continue with tibfemoral jt mobs to address terminal knee extension, progress functional LE strengthening as tolerated with focus on hip musculature. Review and modify HEP as needed. Gait training for symmetrical step/stride without AD  consistently.       Patient will benefit from skilled therapeutic intervention in order to improve the following deficits and impairments:  Abnormal gait, Decreased activity tolerance, Decreased balance, Pain, Decreased range of motion, Impaired flexibility, Difficulty walking, Decreased strength  Visit Diagnosis: Stiffness of left knee, not elsewhere classified  Pain in left knee  Muscle weakness (generalized)     Problem List Patient Active Problem List   Diagnosis Date Noted  . Constipation 02/05/2016  . Acute cystitis with hematuria 11/21/2015  . Preoperative examination 11/20/2015  . Need for 23-polyvalent pneumococcal polysaccharide vaccine 11/20/2015  . Change in bowel habits 09/16/2015  . Rectal bleeding 09/16/2015  . Knee pain, left 09/08/2015  . Allergic rhinitis 10/08/2014  . GAD (generalized anxiety disorder) 09/23/2014  . POSTSURGICAL HYPOTHYROIDISM 06/22/2010  . CARPAL TUNNEL SYNDROME, BILATERAL 10/22/2009  . Anemia 08/04/2009  . GERD 04/20/2009  . Hyperlipidemia LDL goal <100 05/16/2008  . Essential hypertension 05/16/2008  . OSTEOPENIA 05/16/2008  . Urinary incontinence 05/16/2008   Ihor Austin, LPTA;  CBIS [phone]  Aldona Lento 02/16/2016, [place] [address] [place], [place], [address] Phone: [phone]   Fax:  [phone]  Name: Laura [name] MRN: [id number] Date of Birth: 04/18/1951

## 2016-02-18 ENCOUNTER — Encounter (HOSPITAL_COMMUNITY): Payer: Medicare Other | Admitting: Physical Therapy

## 2016-02-18 ENCOUNTER — Ambulatory Visit (HOSPITAL_COMMUNITY): Payer: Medicare Other | Admitting: Physical Therapy

## 2016-02-18 DIAGNOSIS — M24662 Ankylosis, left knee: Secondary | ICD-10-CM | POA: Diagnosis not present

## 2016-02-18 DIAGNOSIS — M6281 Muscle weakness (generalized): Secondary | ICD-10-CM | POA: Diagnosis not present

## 2016-02-18 DIAGNOSIS — S83207S Unspecified tear of unspecified meniscus, current injury, left knee, sequela: Secondary | ICD-10-CM | POA: Diagnosis not present

## 2016-02-18 DIAGNOSIS — M25562 Pain in left knee: Secondary | ICD-10-CM | POA: Diagnosis not present

## 2016-02-18 DIAGNOSIS — M25662 Stiffness of left knee, not elsewhere classified: Secondary | ICD-10-CM

## 2016-02-18 DIAGNOSIS — R29898 Other symptoms and signs involving the musculoskeletal system: Secondary | ICD-10-CM | POA: Diagnosis not present

## 2016-02-18 NOTE — Therapy (Signed)
[place], [place], [address] Phone: [phone]   Fax:  [phone]  Physical Therapy Treatment  Patient Details  Name: Laura [name] MRN: [id number] Date of Birth: [birth year] Referring Provider: Netta Cedars, MD  Encounter Date: 02/18/2016      PT End of Session - 02/18/16 1736    Visit Number 11   Number of Visits 17   Date for PT Re-Evaluation 03/08/16   Authorization Type Medicare   Authorization Time Period G-codes done  01/03/23 visit   Authorization - Visit Number 11   Authorization - Number of Visits 19   PT Start Time 1645   PT Stop Time 1730   PT Time Calculation (min) 45 min   Activity Tolerance Patient tolerated treatment well   Behavior During Therapy [facility] for tasks assessed/performed      Past Medical History  Diagnosis Date  . GERD (gastroesophageal reflux disease)   . Gastritis   . Hypertension   . Hyperlipidemia   . Anemia   . Cystitis   . Osteopenia   . Shoulder pain   . Hypothyroidism   . Hashimoto's thyroiditis     Hx   . Urinary incontinence   . Anxiety   . Depression     Past Surgical History  Procedure Laterality Date  . Total abdominal hysterectomy  1994  . Umbilical hernia repair    . Breast excisional biopsy  2010    Excisional biopsy of benign left breast mass -lopoma   . Resection of left lobe of thyroid    . Bilateral tubal ligation    . Right carpal tunnel release    . Urethral dilation for stenosis  2009  . Rt. knee athroscopy  2004  . Colonscopy  2005    Dr. Tamala Julian  . Esophagogastroduodenoscopy  12/2009    chronic gastritis  . Breast surgery      left nreast biopsy  . Colonoscopy  08/26/2011    SLF: 1. Internal hemorrhoids  . Colonoscopy N/A 10/06/2015    [facility]:QPRF fissure or internal hemorrhoids/mild sigmoid colitis. benign colonic path   . Left knee surgery  March 2017    There were no vitals filed for this visit.      Subjective Assessment - 02/18/16  1648    Subjective Pt states she was really sore after her last session and had to take 2 pain pills. She has been doing good walking on her heels lately. No pain sitting here but notes a dull ache when she extends her knee.    Currently in Pain? No/denies                         [facility] Adult PT Treatment/Exercise - 02/18/16 0001    Balance   Balance Assessed --  SLS on foam/firm surface: x2 sets for 20 sec. each   Knee/Hip Exercises: Aerobic   Recumbent Bike 5 minutes L3   Knee/Hip Exercises: Standing   Wall Squat 2 sets;15 reps  improved wt shift noted with min cues   Manual Therapy   Manual Therapy Joint mobilization   Manual therapy comments performed seperately from all other interventions   Joint Mobilization grade I-IV S<>I patellar mob                PT Education - 02/18/16 1734    Education provided Yes   Education Details demonstrated self patellar mobs    Person(s) Educated  Patient   Methods Explanation;Demonstration   Comprehension Verbalized understanding;Returned demonstration          PT Short Term Goals - 02/09/16 0924    PT SHORT TERM GOAL #1   Title Pt to be independent with HEP for Lt knee ROM and strengthening   Baseline 4/11- reports she is doing HEP every day    Time 3   Period Weeks   Status Achieved   PT SHORT TERM GOAL #2   Title Pt to have 0 degrees on Lt knee extension for gait stability.   Baseline 4/11- still at 6 degrees extension    Time 3   Period Weeks   Status On-going   PT SHORT TERM GOAL #3   Title Pt to have 115 degrees of lt knee flexion for driving tasks.    Baseline 4/11- 115    Time 3   Period Weeks   Status Achieved   PT SHORT TERM GOAL #4   Title Pt to ambulate community distances without assistive device.    Baseline 4/11- reports that she carries cane with her but really does not use it    Time 3   Period Weeks   Status Partially Met           PT Long Term Goals - 02/09/16 0926    PT  LONG TERM GOAL #1   Title Pt to be independent with a progressive HEP for continuation of PT gains upon D/C.    Time 6   Period Weeks   Status On-going   PT LONG TERM GOAL #2   Title Patient to have greater or equal to 120 degrees of lt knee flexion for sitting on low surfaces.    Time 6   Period Weeks   Status On-going   PT LONG TERM GOAL #3   Title Patient to have 4+/5 strength with Lt knee flexion and extension for functional tasks.     Time 6   Period Weeks   Status Achieved   PT LONG TERM GOAL #4   Title Patient to have FOTO score of 40% restriction or less.    Baseline 4/11- 47% limited    Time 6   Period Weeks   Status On-going   PT LONG TERM GOAL #5   Title Pt to ambulate up/down 4 steps with hand rail with alternating steps.    Baseline 4/11- able to do so without railings, hwoever some slight fucntional weakness and stiffness    Time 6   Period Weeks   Status Achieved               Plan - 02/18/16 1740    Clinical Impression Statement Today's session focused on manual techniques and activity to improve functional strength and balance. Pt demonstrates good balance on both stable and unstable surfaces without LOB. She demonstrates pain along the patellar tendon and medial portal site which did not improve with manual treatment this session. Demonstrated patellar mobilization for improved mobility and ROM which pt was able to return demonstration. Will continue to monitor pain and will continue with current POC.    Rehab Potential Good   PT Frequency 2x / week   PT Duration Other (comment)  5 weeks   PT Next Visit Plan patellar mobs S/I, progress functional LE strengthening as tolerated with focus on hip musculature. Review and modify HEP as needed. Gait training for symmetrical step/stride without AD  consistently.    PT Home Exercise Plan updated 02/18/16  with S/I patellar mobs   Consulted and Agree with Plan of Care Patient      Patient will benefit from  skilled therapeutic intervention in order to improve the following deficits and impairments:  Abnormal gait, Decreased activity tolerance, Decreased balance, Pain, Decreased range of motion, Impaired flexibility, Difficulty walking, Decreased strength  Visit Diagnosis: Stiffness of left knee, not elsewhere classified  Pain in left knee  Muscle weakness (generalized)     Problem List Patient Active Problem List   Diagnosis Date Noted  . Constipation 02/05/2016  . Acute cystitis with hematuria 11/21/2015  . Preoperative examination 11/20/2015  . Need for 23-polyvalent pneumococcal polysaccharide vaccine 11/20/2015  . Change in bowel habits 09/16/2015  . Rectal bleeding 09/16/2015  . Knee pain, left 09/08/2015  . Allergic rhinitis 10/08/2014  . GAD (generalized anxiety disorder) 09/23/2014  . POSTSURGICAL HYPOTHYROIDISM 06/22/2010  . CARPAL TUNNEL SYNDROME, BILATERAL 10/22/2009  . Anemia 08/04/2009  . GERD 04/20/2009  . Hyperlipidemia LDL goal <100 05/16/2008  . Essential hypertension 05/16/2008  . OSTEOPENIA 05/16/2008  . Urinary incontinence 05/16/2008   5:49 PM,02/18/2016 Elly Modena PT, DPT [facility] [place] [address] [place], [place], [address] Phone: [phone]   Fax:  [phone]  Name: Laura [name] MRN: [id number] Date of Birth: 1951-03-29

## 2016-02-19 ENCOUNTER — Other Ambulatory Visit: Payer: Self-pay | Admitting: Family Medicine

## 2016-02-23 ENCOUNTER — Ambulatory Visit (HOSPITAL_COMMUNITY): Payer: Medicare Other

## 2016-02-23 ENCOUNTER — Encounter (HOSPITAL_COMMUNITY): Payer: Medicare Other

## 2016-02-23 DIAGNOSIS — M25662 Stiffness of left knee, not elsewhere classified: Secondary | ICD-10-CM

## 2016-02-23 DIAGNOSIS — S83207S Unspecified tear of unspecified meniscus, current injury, left knee, sequela: Secondary | ICD-10-CM | POA: Diagnosis not present

## 2016-02-23 DIAGNOSIS — M25562 Pain in left knee: Secondary | ICD-10-CM

## 2016-02-23 DIAGNOSIS — M6281 Muscle weakness (generalized): Secondary | ICD-10-CM

## 2016-02-23 DIAGNOSIS — R29898 Other symptoms and signs involving the musculoskeletal system: Secondary | ICD-10-CM | POA: Diagnosis not present

## 2016-02-23 DIAGNOSIS — M24662 Ankylosis, left knee: Secondary | ICD-10-CM | POA: Diagnosis not present

## 2016-02-23 NOTE — Therapy (Signed)
[place], [place], [address] Phone: [phone]   Fax:  [phone]  Physical Therapy Treatment  Patient Details  Name: Laura [name] MRN: [id number] Date of Birth: 1950-11-21 Referring Provider: Netta Cedars, MD  Encounter Date: 02/23/2016      PT End of Session - 02/23/16 1018    Visit Number 12   Number of Visits 17   Date for PT Re-Evaluation 03/08/16   Authorization Type Medicare   Authorization Time Period G-codes done  12-28-2022 visit   Authorization - Visit Number 12   Authorization - Number of Visits 19   PT Start Time [phone]   PT Stop Time 1027   PT Time Calculation (min) 44 min   Activity Tolerance Patient tolerated treatment well   Behavior During Therapy [facility] for tasks assessed/performed      Past Medical History  Diagnosis Date  . GERD (gastroesophageal reflux disease)   . Gastritis   . Hypertension   . Hyperlipidemia   . Anemia   . Cystitis   . Osteopenia   . Shoulder pain   . Hypothyroidism   . Hashimoto's thyroiditis     Hx   . Urinary incontinence   . Anxiety   . Depression     Past Surgical History  Procedure Laterality Date  . Total abdominal hysterectomy  1994  . Umbilical hernia repair    . Breast excisional biopsy  2010    Excisional biopsy of benign left breast mass -lopoma   . Resection of left lobe of thyroid    . Bilateral tubal ligation    . Right carpal tunnel release    . Urethral dilation for stenosis  2009  . Rt. knee athroscopy  2004  . Colonscopy  2005    Dr. Tamala Julian  . Esophagogastroduodenoscopy  12/2009    chronic gastritis  . Breast surgery      left nreast biopsy  . Colonoscopy  08/26/2011    SLF: 1. Internal hemorrhoids  . Colonoscopy N/A 10/06/2015    MPN:TIRW fissure or internal hemorrhoids/mild sigmoid colitis. benign colonic path   . Left knee surgery  March 2017    There were no vitals filed for this visit.      Subjective Assessment - 02/23/16  0948    Subjective Pt stated she continues to have pain on medial aspect of Lt knee, pain scale 5/10 aggravating pain.  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    Currently in Pain? Yes   Pain Score 5    Pain Location Knee   Pain Orientation Left;Medial   Pain Descriptors / Indicators Nagging   Pain Type Chronic pain   Pain Radiating Towards none                         [facility] Adult PT Treatment/Exercise - 02/23/16 0001    Knee/Hip Exercises: Aerobic   Recumbent Bike 5 minutes L3   Knee/Hip Exercises: Standing   Wall Squat 3 sets;10 reps;Limitations   Wall Squat Limitations (+) wt shift Rt; RTB to reduce adduction   SLS Rt 53" Lt 60"   SLS with Vectors 3x 5" with intermittent UE A   Knee/Hip Exercises: Supine   Quad Sets Left;10 reps   Quad Sets Limitations 2 degrees lacking   Heel Slides Left;10 reps;AROM   Heel  Slides Limitations 2-120   Manual Therapy   Manual Therapy Joint mobilization   Manual therapy comments performed seperately from all other interventions   Joint Mobilization grade I-IV S<>I patellar mob   Soft tissue mobilization scar tissue    Myofascial Release to anterior and posterior knee to decrease adhesions                  PT Short Term Goals - 02/09/16 0924    PT SHORT TERM GOAL #1   Title Pt to be independent with HEP for Lt knee ROM and strengthening   Baseline 4/11- reports she is doing HEP every day    Time 3   Period Weeks   Status Achieved   PT SHORT TERM GOAL #2   Title Pt to have 0 degrees on Lt knee extension for gait stability.   Baseline 4/11- still at 6 degrees extension    Time 3   Period Weeks   Status On-going   PT SHORT TERM GOAL #3   Title Pt to have 115 degrees of lt knee flexion for driving tasks.    Baseline 4/11- 115    Time 3   Period Weeks   Status Achieved   PT SHORT TERM GOAL #4   Title Pt to ambulate  community distances without assistive device.    Baseline 4/11- reports that she carries cane with her but really does not use it    Time 3   Period Weeks   Status Partially Met           PT Long Term Goals - 02/09/16 0926    PT LONG TERM GOAL #1   Title Pt to be independent with a progressive HEP for continuation of PT gains upon D/C.    Time 6   Period Weeks   Status On-going   PT LONG TERM GOAL #2   Title Patient to have greater or equal to 120 degrees of lt knee flexion for sitting on low surfaces.    Time 6   Period Weeks   Status On-going   PT LONG TERM GOAL #3   Title Patient to have 4+/5 strength with Lt knee flexion and extension for functional tasks.     Time 6   Period Weeks   Status Achieved   PT LONG TERM GOAL #4   Title Patient to have FOTO score of 40% restriction or less.    Baseline 4/11- 47% limited    Time 6   Period Weeks   Status On-going   PT LONG TERM GOAL #5   Title Pt to ambulate up/down 4 steps with hand rail with alternating steps.    Baseline 4/11- able to do so without railings, hwoever some slight fucntional weakness and stiffness    Time 6   Period Weeks   Status Achieved               Plan - 02/23/16 1330    Clinical Impression Statement Session focus on manual techniques for pain control and improving strengthening of proximal musculature and balance training.  Pt able to SLS for a minute on stable surface and able to stand on dynamic surfaces wtihout LOB.  Added vector stance to improve LE stabilty and proximal musculature strengtheing.  Pt demonstrated pain on medial incision spot, continued with manual patella mobs with noted restrictions with superior to inferior and adhensions with scar tissue.  manual scar tissue mobilizations complete and strengtheing of quadriceps to improve knee extension.  Improved AROM 2-120 following manaul and ROM based activities including bike, stretches and therex.     Rehab Potential Good   PT  Frequency 2x / week   PT Duration --  5 weeks   PT Treatment/Interventions ADLs/Self Care Home Management;Cryotherapy;Electrical Stimulation;Moist Heat;Ultrasound;Patient/family education;Neuromuscular re-education;Therapeutic exercise;Balance training;Therapeutic activities;Functional mobility training;Stair training;Gait training;Manual techniques;Taping   PT Next Visit Plan patellar mobs S/I, progress functional LE strengthening as tolerated with focus on hip musculature. Review and modify HEP as needed. Gait training for symmetrical step/stride without AD  consistently.       Patient will benefit from skilled therapeutic intervention in order to improve the following deficits and impairments:  Abnormal gait, Decreased activity tolerance, Decreased balance, Pain, Decreased range of motion, Impaired flexibility, Difficulty walking, Decreased strength  Visit Diagnosis: Stiffness of left knee, not elsewhere classified  Pain in left knee  Muscle weakness (generalized)     Problem List Patient Active Problem List   Diagnosis Date Noted  . Constipation 02/05/2016  . Acute cystitis with hematuria 11/21/2015  . Preoperative examination 11/20/2015  . Need for 23-polyvalent pneumococcal polysaccharide vaccine 11/20/2015  . Change in bowel habits 09/16/2015  . Rectal bleeding 09/16/2015  . Knee pain, left 09/08/2015  . Allergic rhinitis 10/08/2014  . GAD (generalized anxiety disorder) 09/23/2014  . POSTSURGICAL HYPOTHYROIDISM 06/22/2010  . CARPAL TUNNEL SYNDROME, BILATERAL 10/22/2009  . Anemia 08/04/2009  . GERD 04/20/2009  . Hyperlipidemia LDL goal <100 05/16/2008  . Essential hypertension 05/16/2008  . OSTEOPENIA 05/16/2008  . Urinary incontinence 05/16/2008   Ihor Austin, LPTA; [place]  Aldona Lento 02/23/2016, 3:55 PM  [place], [place], [address] Phone: [phone]   Fax:   [phone]  Name: HARLEYQUINN GASSER MRN: [id number] Date of Birth: 04/04/1951

## 2016-02-25 ENCOUNTER — Ambulatory Visit (HOSPITAL_COMMUNITY): Payer: Medicare Other | Admitting: Physical Therapy

## 2016-02-25 ENCOUNTER — Ambulatory Visit: Payer: Medicare Other | Admitting: "Endocrinology

## 2016-02-25 ENCOUNTER — Ambulatory Visit (INDEPENDENT_AMBULATORY_CARE_PROVIDER_SITE_OTHER): Payer: Medicare Other | Admitting: "Endocrinology

## 2016-02-25 ENCOUNTER — Encounter: Payer: Self-pay | Admitting: "Endocrinology

## 2016-02-25 ENCOUNTER — Ambulatory Visit (HOSPITAL_COMMUNITY): Payer: Medicare Other

## 2016-02-25 VITALS — BP 128/78 | HR 88 | Ht 65.0 in | Wt 178.0 lb

## 2016-02-25 DIAGNOSIS — S83207S Unspecified tear of unspecified meniscus, current injury, left knee, sequela: Secondary | ICD-10-CM | POA: Diagnosis not present

## 2016-02-25 DIAGNOSIS — M24662 Ankylosis, left knee: Secondary | ICD-10-CM | POA: Diagnosis not present

## 2016-02-25 DIAGNOSIS — M25662 Stiffness of left knee, not elsewhere classified: Secondary | ICD-10-CM | POA: Diagnosis not present

## 2016-02-25 DIAGNOSIS — M25562 Pain in left knee: Secondary | ICD-10-CM

## 2016-02-25 DIAGNOSIS — E890 Postprocedural hypothyroidism: Secondary | ICD-10-CM | POA: Diagnosis not present

## 2016-02-25 DIAGNOSIS — R29898 Other symptoms and signs involving the musculoskeletal system: Secondary | ICD-10-CM | POA: Diagnosis not present

## 2016-02-25 DIAGNOSIS — M6281 Muscle weakness (generalized): Secondary | ICD-10-CM

## 2016-02-25 NOTE — Progress Notes (Signed)
HPI  Laura [name] is a 65 y.o.-year-old female,  She has  medical h/o Goiter s/p Lt sided hemithyroidectomy 20+ years ago,  unremarkable thyroid u/s in 2012.  She was seen by me 2 years ago, she no showed since then. She remained on levothyroxine 100 g.  She is compliant taking her medication properly.  Her most recent thyroid function tests are consistent with appropriate replacement.    Pt  lost 8 pounds since last visit, denies  cold intolerance, heat intolerance, palpitations.  Pt denies feeling nodules in neck, hoarseness, dysphagia/odynophagia, SOB with lying down.   No h/o radiation tx to head or neck. No recent use of iodine supplements.    ROS: Constitutional: + weight loss, no fatigue, no subjective hyperthermia. She [name]/o cold hands . Eyes: no blurry vision, no xerophthalmia ENT: no sore throat, no nodules palpated in throat, no dysphagia/odynophagia, no hoarseness Cardiovascular: no CP/SOB/palpitations/leg swelling Respiratory: no cough/SOB Gastrointestinal: no N/V/D/[name] Musculoskeletal: no muscle/joint aches Skin: no rashes Neurological: no tremors/numbness/tingling/dizziness Psychiatric: no depression/anxiety  PE: BP 128/78 mmHg  Pulse 88  Ht 5\' 5"  (1.651 m)  Wt 178 lb (80.74 kg)  BMI 29.62 kg/m2  SpO2 97% Wt Readings from Last 3 Encounters:  02/25/16 178 lb (80.74 kg)  02/05/16 176 lb 6.4 oz (80.015 kg)  11/20/15 180 lb (81.647 kg)   Constitutional: overweight, in NAD Eyes: PERRLA, EOMI, no exophthalmos ENT: moist mucous membranes, no thyromegaly, no cervical lymphadenopathy Cardiovascular: RRR, No MRG Respiratory: CTA B Gastrointestinal: abdomen soft, NT, ND, BS+ Musculoskeletal: no deformities, strength intact in all 4 Skin: moist, warm, no rashes Neurological: no tremor with outstretched hands, DTR normal in all 4  Results for Laura EstimableRKER, Laura [name] (MRN [id number]) as of 02/25/2016 13:36  Ref. Range 02/15/2016 08:48  TSH Latest Units: mIU/L 0.94   T4,Free(Direct) Latest Ref Range: 0.8-1.8 ng/dL 1.3    ASSESSMENT: 1. Hypothyroidism, postsurgical  PLAN:  Based on her most recent thyroid function test, she is on appropriate replacement with levothyroxine 100 g by mouth every morning.  - We discussed about correct intake of levothyroxine, at fasting, with water, separated by at least 30 minutes from breakfast, and separated by more than 4 hours from calcium, iron, multivitamins, acid reflux medications (PPIs). -Patient is made aware of the fact that thyroid hormone replacement is needed for life, dose to be adjusted by periodic monitoring of thyroid function tests. - Will check thyroid tests before next visit: TSH, free T4 in 6 months. -Due to absence of clinical goiter, no need for thyroid ultrasound for now.  Laura LunchGebre Laura Willadsen, MD Phone: [phone]  Fax: [phone]   02/25/2016, 1:42 PM

## 2016-02-25 NOTE — Therapy (Signed)
[place], [place], [address] Phone: [phone]   Fax:  [phone]  Physical Therapy Treatment  Patient Details  Name: Laura [name] MRN: [id number] Date of Birth: Dec 05, 1950 Referring Provider: Netta Cedars, MD  Encounter Date: 02/25/2016      PT End of Session - 02/25/16 1445    Visit Number 13   Number of Visits 17   Date for PT Re-Evaluation 03/08/16   Authorization Type Medicare   Authorization Time Period G-codes done  01-02-2023 visit   Authorization - Visit Number 13   Authorization - Number of Visits 19   PT Start Time 01-02-37   PT Stop Time 1516   PT Time Calculation (min) 38 min   Activity Tolerance Patient tolerated treatment well   Behavior During Therapy [facility] for tasks assessed/performed      Past Medical History  Diagnosis Date  . GERD (gastroesophageal reflux disease)   . Gastritis   . Hypertension   . Hyperlipidemia   . Anemia   . Cystitis   . Osteopenia   . Shoulder pain   . Hypothyroidism   . Hashimoto's thyroiditis     Hx   . Urinary incontinence   . Anxiety   . Depression     Past Surgical History  Procedure Laterality Date  . Total abdominal hysterectomy  1994  . Umbilical hernia repair    . Breast excisional biopsy  01-02-2009    Excisional biopsy of benign left breast mass -lopoma   . Resection of left lobe of thyroid    . Bilateral tubal ligation    . Right carpal tunnel release    . Urethral dilation for stenosis  2008-01-03  . Rt. knee athroscopy  2003-01-02  . Colonscopy  January 03, 2004    Dr. Tamala Julian  . Esophagogastroduodenoscopy  12/2009    chronic gastritis  . Breast surgery      left nreast biopsy  . Colonoscopy  08/26/2011    SLF: 1. Internal hemorrhoids  . Colonoscopy N/A 10/06/2015    GTX:MIWO fissure or internal hemorrhoids/mild sigmoid colitis. benign colonic path   . Left knee surgery  March 2017    There were no vitals filed for this visit.      Subjective Assessment - 02/25/16  1442    Subjective Knee feels good today, have tried the vector stance at home and stated it aggravates the medial portion of knee.  Reports compliance with HEP dailiy and has completed patella mobs.   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    Currently in Pain? No/denies            [facility] PT Assessment - 02/25/16 0001    Assessment   Medical Diagnosis Lt knee scope, meniscus debridement   Referring Provider Netta Cedars, MD   Onset Date/Surgical Date 12/30/15   Next MD Visit 1st week in May, 2017   Prior Therapy none              [facility] Adult PT Treatment/Exercise - 02/25/16 0001    Knee/Hip Exercises: Stretches   Gastroc Stretch 3 reps;30 seconds   Gastroc Stretch Limitations slant board   Knee/Hip Exercises: Aerobic   Recumbent Bike 5 minutes L3   Knee/Hip Exercises: Standing   Forward Lunges 10 reps   Forward Lunges Limitations 4in step   Terminal Knee Extension Limitations 15x5" RTB   Wall Squat 3 sets;10 reps;Limitations   Wall Squat Limitations (+)  wt shift Rt; RTB   SLS with Vectors 3x 5" with intermittent UE A   Other Standing Knee Exercises sidestep 2RT with RTB   Manual Therapy   Manual Therapy Joint mobilization   Manual therapy comments performed seperately from all other interventions   Joint Mobilization grade I-IV S<>I patellar mob   Soft tissue mobilization scar tissue    Myofascial Release to anterior and posterior knee to decrease adhesions             PT Short Term Goals - 02/09/16 0924    PT SHORT TERM GOAL #1   Title Pt to be independent with HEP for Lt knee ROM and strengthening   Baseline 4/11- reports she is doing HEP every day    Time 3   Period Weeks   Status Achieved   PT SHORT TERM GOAL #2   Title Pt to have 0 degrees on Lt knee extension for gait stability.   Baseline 4/11- still at 6 degrees extension    Time 3   Period Weeks   Status On-going   PT SHORT TERM GOAL #3   Title Pt to have 115 degrees  of lt knee flexion for driving tasks.    Baseline 4/11- 115    Time 3   Period Weeks   Status Achieved   PT SHORT TERM GOAL #4   Title Pt to ambulate community distances without assistive device.    Baseline 4/11- reports that she carries cane with her but really does not use it    Time 3   Period Weeks   Status Partially Met           PT Long Term Goals - 02/09/16 0926    PT LONG TERM GOAL #1   Title Pt to be independent with a progressive HEP for continuation of PT gains upon D/C.    Time 6   Period Weeks   Status On-going   PT LONG TERM GOAL #2   Title Patient to have greater or equal to 120 degrees of lt knee flexion for sitting on low surfaces.    Time 6   Period Weeks   Status On-going   PT LONG TERM GOAL #3   Title Patient to have 4+/5 strength with Lt knee flexion and extension for functional tasks.     Time 6   Period Weeks   Status Achieved   PT LONG TERM GOAL #4   Title Patient to have FOTO score of 40% restriction or less.    Baseline 4/11- 47% limited    Time 6   Period Weeks   Status On-going   PT LONG TERM GOAL #5   Title Pt to ambulate up/down 4 steps with hand rail with alternating steps.    Baseline 4/11- able to do so without railings, hwoever some slight fucntional weakness and stiffness    Time 6   Period Weeks   Status Achieved               Plan - 02/25/16 1619    Clinical Impression Statement Session focus on improving functional strengthening especially with proximal musculature and quad strengthening to improve knee extension.  Added forward lunge to knee stabiltiy and sidestepping for glut med strengthening to improve balance for stabilty as well.  Manual patella mobility wtih noted restrictions with superior to interior to reduce adhesions with scar tissue.  No reports of pain through session.     Rehab Potential Good   PT  Frequency 2x / week   PT Duration --  5 weeks   PT Treatment/Interventions ADLs/Self Care Home  Management;Cryotherapy;Electrical Stimulation;Moist Heat;Ultrasound;Patient/family education;Neuromuscular re-education;Therapeutic exercise;Balance training;Therapeutic activities;Functional mobility training;Stair training;Gait training;Manual techniques;Taping   PT Next Visit Plan patellar mobs S/I, progress functional LE strengthening as tolerated with focus on hip musculature. Review and modify HEP as needed. Gait training for symmetrical step/stride without AD  consistently.       Patient will benefit from skilled therapeutic intervention in order to improve the following deficits and impairments:  Abnormal gait, Decreased activity tolerance, Decreased balance, Pain, Decreased range of motion, Impaired flexibility, Difficulty walking, Decreased strength  Visit Diagnosis: Stiffness of left knee, not elsewhere classified  Pain in left knee  Muscle weakness (generalized)     Problem List Patient Active Problem List   Diagnosis Date Noted  . Constipation 02/05/2016  . Acute cystitis with hematuria 11/21/2015  . Preoperative examination 11/20/2015  . Need for 23-polyvalent pneumococcal polysaccharide vaccine 11/20/2015  . Change in bowel habits 09/16/2015  . Rectal bleeding 09/16/2015  . Knee pain, left 09/08/2015  . Allergic rhinitis 10/08/2014  . GAD (generalized anxiety disorder) 09/23/2014  . POSTSURGICAL HYPOTHYROIDISM 06/22/2010  . CARPAL TUNNEL SYNDROME, BILATERAL 10/22/2009  . Anemia 08/04/2009  . GERD 04/20/2009  . Hyperlipidemia LDL goal <100 05/16/2008  . Essential hypertension 05/16/2008  . OSTEOPENIA 05/16/2008  . Urinary incontinence 05/16/2008   Ihor Austin, LPTA; CBIS [phone]  Aldona Lento 02/25/2016, 4:57 PM  [place] [address] [place], [place], [address] Phone: [phone]   Fax:  [phone]  Name: DONNETTA GILLIN MRN: [id number] Date of Birth: 05-Jul-1951

## 2016-03-01 ENCOUNTER — Ambulatory Visit (HOSPITAL_COMMUNITY): Payer: Medicare Other | Attending: Family Medicine

## 2016-03-01 DIAGNOSIS — M25662 Stiffness of left knee, not elsewhere classified: Secondary | ICD-10-CM | POA: Diagnosis not present

## 2016-03-01 DIAGNOSIS — M6281 Muscle weakness (generalized): Secondary | ICD-10-CM

## 2016-03-01 DIAGNOSIS — M25562 Pain in left knee: Secondary | ICD-10-CM

## 2016-03-01 NOTE — Patient Instructions (Signed)
FUNCTIONAL MOBILITY: Wall Squat    Stance: shoulder-width on floor, against wall. Place feet in front of hips. Bend hips and knees. Keep back straight. Do not allow knees to bend past toes. Squeeze glutes and quads to stand. 10-20 reps per set, 1-2 sets per day, 3-5 days per week  Copyright  VHI. All rights reserved.    Forward Lunge    Standing with feet shoulder width apart and stomach tight, step forward with left leg. Repeat 10-20 times per set. Do 1-2sets per session. Do 1-2 sessions per day.  http://orth.exer.us/1147   Copyright  VHI. All rights reserved.   Calf Stretch    Place hands on wall at shoulder height. Keeping back leg straight, bend front leg, feet pointing forward, heels flat on floor. Lean forward slightly until stretch is felt in calf of back leg. Hold stretch 30 seconds, breathing slowly in and out. Repeat stretch with other leg back. Do 3 sessions per day. Variation: Use chair or table for support.  Copyright  VHI. All rights reserved.

## 2016-03-01 NOTE — Therapy (Signed)
[place], [place], [address] Phone: [phone]   Fax:  [phone]  Physical Therapy Treatment  Patient Details  Name: Laura [name] MRN: [id number] Date of Birth: 12-13-1950 Referring Provider: Netta Cedars, MD  Encounter Date: 03/01/2016      PT End of Session - 03/01/16 1259    Visit Number 14   Number of Visits 17   Date for PT Re-Evaluation 03/08/16   Authorization Type Medicare   Authorization Time Period G-codes done  12/25/2022 visit   Authorization - Visit Number 14   Authorization - Number of Visits 18   PT Start Time [phone]   PT Stop Time 1035   PT Time Calculation (min) 47 min   Equipment Utilized During Treatment Gait belt   Activity Tolerance Patient tolerated treatment well   Behavior During Therapy [facility] for tasks assessed/performed      Past Medical History  Diagnosis Date  . GERD (gastroesophageal reflux disease)   . Gastritis   . Hypertension   . Hyperlipidemia   . Anemia   . Cystitis   . Osteopenia   . Shoulder pain   . Hypothyroidism   . Hashimoto's thyroiditis     Hx   . Urinary incontinence   . Anxiety   . Depression     Past Surgical History  Procedure Laterality Date  . Total abdominal hysterectomy  1994  . Umbilical hernia repair    . Breast excisional biopsy  2010    Excisional biopsy of benign left breast mass -lopoma   . Resection of left lobe of thyroid    . Bilateral tubal ligation    . Right carpal tunnel release    . Urethral dilation for stenosis  2009  . Rt. knee athroscopy  2004  . Colonscopy  2005    Dr. Tamala Julian  . Esophagogastroduodenoscopy  12/2009    chronic gastritis  . Breast surgery      left nreast biopsy  . Colonoscopy  08/26/2011    SLF: 1. Internal hemorrhoids  . Colonoscopy N/A 10/06/2015    GGE:ZMOQ fissure or internal hemorrhoids/mild sigmoid colitis. benign colonic path   . Left knee surgery  March 2017    There were no vitals filed for this  visit.      Subjective Assessment - 03/01/16 0939    Subjective Knee is feeling good today, reports walker for 8 hours yesterday without AD, continues to use AD when out shopping for long periods of time.     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    Currently in Pain? No/denies            [facility] PT Assessment - 03/01/16 0001    Assessment   Medical Diagnosis Lt knee scope, meniscus debridement   Referring Provider Netta Cedars, MD   Onset Date/Surgical Date 12/30/15   Next MD Visit 03/03/2016   Prior Therapy none   AROM   Left Knee Extension 2  was 6   Left Knee Flexion 120  was 115   Strength   Right Hip Flexion 4/5  was 4/5   Right Hip Extension 3+/5  was 3+   Right Hip ABduction 3+/5  was 3/5; compensation with hip flexor   Left Hip Flexion 4/5   Left Hip Extension 3+/5   Left Hip ABduction 3+/5  was 3/5; compensation with hip flexor   Right Knee Flexion 5/5   Right Knee Extension  5/5   Left Knee Flexion 4+/5  was 4+/5   Left Knee Extension 4+/5  was 4+/5   Right Ankle Dorsiflexion 5/5   Left Ankle Dorsiflexion 5/5                     [facility] Adult PT Treatment/Exercise - 03/01/16 0001    Knee/Hip Exercises: Stretches   Gastroc Stretch 3 reps;30 seconds   Gastroc Stretch Limitations slant board; 1 standard against wall with HEP given   Knee/Hip Exercises: Aerobic   Recumbent Bike 5 minutes L3 initial session no charge   Knee/Hip Exercises: Standing   Forward Lunges Both;15 reps   Forward Lunges Limitations 4in step   Wall Squat 3 sets;10 reps;Limitations   Wall Squat Limitations (+) wt shift Rt; RTB   SLS with Vectors 3x 5" with intermittent UE A   Other Standing Knee Exercises sidestep 2RT with RTB   Knee/Hip Exercises: Seated   Sit to Sand 5 reps;without UE support                  PT Short Term Goals - 03/01/16 1008    PT SHORT TERM GOAL #1   Title Pt to be independent with HEP for Lt knee ROM and  strengthening   Baseline 4/11- reports she is doing HEP every day    Status Achieved   PT SHORT TERM GOAL #2   Title Pt to have 0 degrees on Lt knee extension for gait stability.   PT SHORT TERM GOAL #3   Title Pt to have 115 degrees of lt knee flexion for driving tasks.    PT SHORT TERM GOAL #4   Title Pt to ambulate community distances without assistive device.    Baseline 05/025/2017:  Reports walking at home without AD, continues to carry [facility] community ambulation though not really using it   Status Partially Met           PT Long Term Goals - 03/01/16 1010    PT LONG TERM GOAL #1   Title Pt to be independent with a progressive HEP for continuation of PT gains upon D/C.    Status On-going   PT LONG TERM GOAL #2   Title Patient to have greater or equal to 120 degrees of lt knee flexion for sitting on low surfaces.    PT LONG TERM GOAL #3   Title Patient to have 4+/5 strength with Lt knee flexion and extension for functional tasks.     Status Achieved   PT LONG TERM GOAL #4   Title Patient to have FOTO score of 40% restriction or less.    PT LONG TERM GOAL #5   Title Pt to ambulate up/down 4 steps with hand rail with alternating steps.    Status Achieved               Plan - 03/01/16 1307    Clinical Impression Statement Session focus on improving functional strengthening especially proximal muscualture and quad strengthening to improve knee extension.   Pt improving form with minimal cueing to improve knee mechanics with exercises.  Pt stated MD apt coming up, ROM measurement and MMT complete with improvements noted AROM 2-120 degrees, continues to demonstrate weak hip musculature.  HEP updated to strengthen gluteal musculature.  End of session no reports of pain.   Rehab Potential Good   PT Frequency 2x / week   PT Duration --  5 weeks   PT Treatment/Interventions ADLs/Self Care  Home Management;Cryotherapy;Electrical Stimulation;Moist Heat;Ultrasound;Patient/family  education;Neuromuscular re-education;Therapeutic exercise;Balance training;Therapeutic activities;Functional mobility training;Stair training;Gait training;Manual techniques;Taping   PT Next Visit Plan patellar mobs S/I, progress functional LE strengthening as tolerated with focus on hip musculature. Review and modify HEP as needed. Gait training for symmetrical step/stride without AD  consistently.  F/U with MD following apt.     PT Home Exercise Plan Updatad 03/01/2016 for proximal mm strengthening.      Patient will benefit from skilled therapeutic intervention in order to improve the following deficits and impairments:  Abnormal gait, Decreased activity tolerance, Decreased balance, Pain, Decreased range of motion, Impaired flexibility, Difficulty walking, Decreased strength  Visit Diagnosis: Stiffness of left knee, not elsewhere classified  Pain in left knee  Muscle weakness (generalized)     Problem List Patient Active Problem List   Diagnosis Date Noted  . Constipation 02/05/2016  . Acute cystitis with hematuria 11/21/2015  . Preoperative examination 11/20/2015  . Need for 23-polyvalent pneumococcal polysaccharide vaccine 11/20/2015  . Change in bowel habits 09/16/2015  . Rectal bleeding 09/16/2015  . Knee pain, left 09/08/2015  . Allergic rhinitis 10/08/2014  . GAD (generalized anxiety disorder) 09/23/2014  . POSTSURGICAL HYPOTHYROIDISM 06/22/2010  . CARPAL TUNNEL SYNDROME, BILATERAL 10/22/2009  . Anemia 08/04/2009  . GERD 04/20/2009  . Hyperlipidemia LDL goal <100 05/16/2008  . Essential hypertension 05/16/2008  . OSTEOPENIA 05/16/2008  . Urinary incontinence 05/16/2008   Ihor Austin, Jay; [place]   Aldona Lento 03/01/2016, 4:39 PM  [place] [address] [place], [place], [address] Phone: [phone]   Fax:  [phone]  Name: JANEKA LIBMAN MRN: [id number] Date of Birth: 11/24/1950

## 2016-03-03 ENCOUNTER — Ambulatory Visit (HOSPITAL_COMMUNITY): Payer: Medicare Other

## 2016-03-03 DIAGNOSIS — M25562 Pain in left knee: Secondary | ICD-10-CM | POA: Diagnosis not present

## 2016-03-03 DIAGNOSIS — M25662 Stiffness of left knee, not elsewhere classified: Secondary | ICD-10-CM

## 2016-03-03 DIAGNOSIS — M6281 Muscle weakness (generalized): Secondary | ICD-10-CM | POA: Diagnosis not present

## 2016-03-03 DIAGNOSIS — Z4789 Encounter for other orthopedic aftercare: Secondary | ICD-10-CM | POA: Diagnosis not present

## 2016-03-03 NOTE — Therapy (Signed)
[place], [place], [address] Phone: [phone]   Fax:  [phone]  Physical Therapy Treatment  Patient Details  Name: Laura [name] MRN: [id number] Date of Birth: January 07, 1951 Referring Provider: Netta Cedars, MD  Encounter Date: 03/03/2016      PT End of Session - 03/03/16 1403    Visit Number 15   Number of Visits 17   Date for PT Re-Evaluation 03/08/16   Authorization Type Medicare   Authorization Time Period G-codes done  01-04-2023 visit   Authorization - Visit Number 15   Authorization - Number of Visits 19   PT Start Time 6812   PT Stop Time 1432   PT Time Calculation (min) 43 min   Activity Tolerance Patient tolerated treatment well   Behavior During Therapy [facility] for tasks assessed/performed      Past Medical History  Diagnosis Date  . GERD (gastroesophageal reflux disease)   . Gastritis   . Hypertension   . Hyperlipidemia   . Anemia   . Cystitis   . Osteopenia   . Shoulder pain   . Hypothyroidism   . Hashimoto's thyroiditis     Hx   . Urinary incontinence   . Anxiety   . Depression     Past Surgical History  Procedure Laterality Date  . Total abdominal hysterectomy  1994  . Umbilical hernia repair    . Breast excisional biopsy  2010    Excisional biopsy of benign left breast mass -lopoma   . Resection of left lobe of thyroid    . Bilateral tubal ligation    . Right carpal tunnel release    . Urethral dilation for stenosis  2009  . Rt. knee athroscopy  2004  . Colonscopy  2005    Dr. Tamala Julian  . Esophagogastroduodenoscopy  12/2009    chronic gastritis  . Breast surgery      left nreast biopsy  . Colonoscopy  08/26/2011    SLF: 1. Internal hemorrhoids  . Colonoscopy N/A 10/06/2015    XNT:ZGYF fissure or internal hemorrhoids/mild sigmoid colitis. benign colonic path   . Left knee surgery  March 2017    There were no vitals filed for this visit.      Subjective Assessment - 03/03/16  1359    Subjective Pt stated knee is feeling good today, reports MD happy with knee progress and has been released.     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    Currently in Pain? No/denies             [facility] Adult PT Treatment/Exercise - 03/03/16 0001    Knee/Hip Exercises: Aerobic   Recumbent Bike 5 minutes L3 initial session no charge   Knee/Hip Exercises: Standing   Forward Lunges Both;15 reps   Forward Lunges Limitations 4in step   Terminal Knee Extension Limitations 15x5" RTB   Functional Squat 15 reps   Wall Squat 3 sets;10 reps;Limitations   Wall Squat Limitations (+) wt shift Rt; RTB   SLS with Vectors 3x 5" with intermittent UE A   Other Standing Knee Exercises sidestep 2RT with RTB   Knee/Hip Exercises: Prone   Straight Leg Raises Limitations quadruped hip extension and abduction   Manual Therapy   Manual Therapy Joint mobilization   Manual therapy comments performed seperately from all other interventions   Joint Mobilization grade I-IV S<>I patellar mob  PT Short Term Goals - 03/03/16 1420    PT SHORT TERM GOAL #1   Title Pt to be independent with HEP for Lt knee ROM and strengthening   Baseline 4/11- reports she is doing HEP every day    Status Achieved   PT SHORT TERM GOAL #2   Title Pt to have 0 degrees on Lt knee extension for gait stability.   Baseline 03/03/2016: 2 degrees lacking extension   PT SHORT TERM GOAL #3   Title Pt to have 115 degrees of lt knee flexion for driving tasks.    Status Achieved   PT SHORT TERM GOAL #4   Title Pt to ambulate community distances without assistive device.    Baseline 05/025/2017:  Reports walking at home without AD, continues to carry [facility] community ambulation though not really using it   Status Partially Met           PT Long Term Goals - 03/03/16 1423    PT LONG TERM GOAL #1   Title Pt to be independent with a progressive HEP for continuation of PT gains upon D/C.     Status On-going   PT LONG TERM GOAL #2   Title Patient to have greater or equal to 120 degrees of lt knee flexion for sitting on low surfaces.    Baseline 03/03/2016: 120 degree flexion   Status Achieved   PT LONG TERM GOAL #3   Title Patient to have 4+/5 strength with Lt knee flexion and extension for functional tasks.     Status Achieved   PT LONG TERM GOAL #4   Title Patient to have FOTO score of 40% restriction or less.    Status On-going   PT LONG TERM GOAL #5   Title Pt to ambulate up/down 4 steps with hand rail with alternating steps.    Baseline 4/11- able to do so without railings, hwoever some slight fucntional weakness and stiffness    Status Achieved               Plan - 03/03/16 1453    Clinical Impression Statement Session focus on improving functional strengthening especially proximal musculature and improving knee extension .  Added quadruped exercises for glut strenghtenig.  Pt able to demonstrate all exercises with min verbal and tactile cuieng for proper form and knee mechanics.  No reports of pain through session.  Pt does continue to present restrictions with patella mobility S/I, manual joint mobs were complete this session.     Rehab Potential Good   PT Frequency 2x / week   PT Duration --  5 weeks   PT Treatment/Interventions ADLs/Self Care Home Management;Cryotherapy;Electrical Stimulation;Moist Heat;Ultrasound;Patient/family education;Neuromuscular re-education;Therapeutic exercise;Balance training;Therapeutic activities;Functional mobility training;Stair training;Gait training;Manual techniques;Taping   PT Next Visit Plan Reassess next session.  Continue functinal strenghtening with focus on hip musculature strengthening and patella mobs S/I      Patient will benefit from skilled therapeutic intervention in order to improve the following deficits and impairments:  Abnormal gait, Decreased activity tolerance, Decreased balance, Pain, Decreased range of  motion, Impaired flexibility, Difficulty walking, Decreased strength  Visit Diagnosis: Stiffness of left knee, not elsewhere classified  Pain in left knee  Muscle weakness (generalized)     Problem List Patient Active Problem List   Diagnosis Date Noted  . Constipation 02/05/2016  . Acute cystitis with hematuria 11/21/2015  . Preoperative examination 11/20/2015  . Need for 23-polyvalent pneumococcal polysaccharide vaccine 11/20/2015  . Change in bowel habits 09/16/2015  .  Rectal bleeding 09/16/2015  . Knee pain, left 09/08/2015  . Allergic rhinitis 10/08/2014  . GAD (generalized anxiety disorder) 09/23/2014  . POSTSURGICAL HYPOTHYROIDISM 06/22/2010  . CARPAL TUNNEL SYNDROME, BILATERAL 10/22/2009  . Anemia 08/04/2009  . GERD 04/20/2009  . Hyperlipidemia LDL goal <100 05/16/2008  . Essential hypertension 05/16/2008  . OSTEOPENIA 05/16/2008  . Urinary incontinence 05/16/2008   Ihor Austin, LPTA; CBIS [phone]  Aldona Lento 03/03/2016, 4:45 PM  [place] [address] [place], [place], [address] Phone: [phone]   Fax:  [phone]  Name: Laura [name] MRN: [id number] Date of Birth: [birth year]

## 2016-03-08 ENCOUNTER — Ambulatory Visit (HOSPITAL_COMMUNITY): Payer: Medicare Other | Admitting: Physical Therapy

## 2016-03-08 DIAGNOSIS — M25662 Stiffness of left knee, not elsewhere classified: Secondary | ICD-10-CM

## 2016-03-08 DIAGNOSIS — M6281 Muscle weakness (generalized): Secondary | ICD-10-CM | POA: Diagnosis not present

## 2016-03-08 DIAGNOSIS — M25562 Pain in left knee: Secondary | ICD-10-CM

## 2016-03-08 NOTE — Therapy (Signed)
[place], [place], [address] Phone: [phone]   Fax:  [phone]  Physical Therapy Treatment  Patient Details  Name: Laura [name] MRN: [id number] Date of Birth: [birth year] Referring Provider: Netta Cedars   Encounter Date: 03/08/2016      PT End of Session - 03/08/16 1540    Visit Number 16   Number of Visits 16   Authorization Type Medicare   Authorization Time Period G-codes done  9th visit   Authorization - Visit Number 16   Authorization - Number of Visits 16   PT Start Time 9021   PT Stop Time 1559   PT Time Calculation (min) 36 min   Activity Tolerance Patient tolerated treatment well      Past Medical History  Diagnosis Date  . GERD (gastroesophageal reflux disease)   . Gastritis   . Hypertension   . Hyperlipidemia   . Anemia   . Cystitis   . Osteopenia   . Shoulder pain   . Hypothyroidism   . Hashimoto's thyroiditis     Hx   . Urinary incontinence   . Anxiety   . Depression     Past Surgical History  Procedure Laterality Date  . Total abdominal hysterectomy  1994  . Umbilical hernia repair    . Breast excisional biopsy  2010    Excisional biopsy of benign left breast mass -lopoma   . Resection of left lobe of thyroid    . Bilateral tubal ligation    . Right carpal tunnel release    . Urethral dilation for stenosis  2009  . Rt. knee athroscopy  2004  . Colonscopy  2005    Dr. Tamala Julian  . Esophagogastroduodenoscopy  12/2009    chronic gastritis  . Breast surgery      left nreast biopsy  . Colonoscopy  08/26/2011    SLF: 1. Internal hemorrhoids  . Colonoscopy N/A 10/06/2015    JDB:ZMCE fissure or internal hemorrhoids/mild sigmoid colitis. benign colonic path   . Left knee surgery  March 2017    There were no vitals filed for this visit.      Subjective Assessment - 03/08/16 1527    Subjective Pt states her MD has released her.     How long can you sit comfortably? Able to  sit for 4 hours was 2    How long can you stand comfortably? 2 hours was 2;    How long can you walk comfortably? 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 Goal met    Currently in Pain? No/denies  Pain only when it rains             [facility] PT Assessment - 03/08/16 0001    Assessment   Medical Diagnosis Lt knee scope, meniscus debridement   Referring Provider Netta Cedars    Onset Date/Surgical Date 12/30/15   Next MD Visit 02/04/16   Prior Therapy none   Precautions   Precautions None   Prior Function   Level of Independence Independent with community mobility with device   Vocation Retired   Associate Professor   Overall Cognitive Status Within Functional Limits for tasks assessed   Observation/Other Assessments   Observations Lt knee effusion   Focus on Therapeutic Outcomes (FOTO)  22% limitation   Sensation   Light Touch --   AROM   Left  Knee Extension 2  was 5   Left Knee Flexion 120  was 82   Strength   Right Hip Extension 5/5   Right Hip ABduction 5/5   Left Hip Flexion 5/5  was 4/5    Left Hip Extension 3+/5   Left Hip ABduction 5/5  was 3/5    Left Knee Flexion 4/5  was 4+/5    Left Knee Extension 5/5  was 4-/5    Ambulation/Gait   Ambulation/Gait Yes   Ambulation Distance (Feet) 265 Feet     Assistive device None   Gait Pattern Step-through pattern   Gait Comments Even strides, full knee extension on Lt with stance phase. No loss of balance.                      [facility] PT Treatment/Exercise - 03/08/16 0001    Knee/Hip Exercises: Standing   Functional Squat 10 reps   SLS with Vectors 3x 5" with intermittent UE A   Knee/Hip Exercises: Seated   Sit to Sand 10 reps   Knee/Hip Exercises: Prone   Hamstring Curl 10 reps   Hamstring Curl Limitations 3#   Hip Extension Strengthening;Right;15 reps                PT Education - 03/08/16 1540    Education  provided Yes   Education Details new HEP    Person(s) Educated Patient   Methods Explanation   Comprehension Verbalized understanding          PT Short Term Goals - 03/08/16 1546    PT SHORT TERM GOAL #1   Title Pt to be independent with HEP for Lt knee ROM and strengthening   Time 3   Period Weeks   Status Achieved   PT SHORT TERM GOAL #2   Title Pt to have 0 degrees on Lt knee extension for gait stability.   Baseline 2   Time 3   Period Weeks   Status On-going   PT SHORT TERM GOAL #3   Title Pt to have 115 degrees of lt knee flexion for driving tasks.    Time 3   Period Weeks   Status Achieved   PT SHORT TERM GOAL #4   Title Pt to ambulate community distances without assistive device.    Time 3   Period Weeks   Status New           PT Long Term Goals - 03/08/16 1547    PT LONG TERM GOAL #1   Title Pt to be independent with a progressive HEP for continuation of PT gains upon D/C.    Time 6   Period Weeks   Status New   PT LONG TERM GOAL #2   Title Patient to have greater or equal to 120 degrees of lt knee flexion for sitting on low surfaces.    Time 6   Period Weeks   Status Achieved   PT LONG TERM GOAL #3   Title Patient to have 4+/5 strength with Lt knee flexion and extension for functional tasks.     Time 6   Period Weeks   Status Achieved   PT LONG TERM GOAL #4   Title Patient to have FOTO score of 40% restriction or less.    Time 6   Period Weeks   Status Achieved   PT LONG TERM GOAL #5   Title Pt to ambulate up/down 4 steps with hand rail with alternating steps.  Time 6   Period Weeks   Status Achieved             Patient will benefit from skilled therapeutic intervention in order to improve the following deficits and impairments:     Visit Diagnosis: Stiffness of left knee, not elsewhere classified  Pain in left knee  Muscle weakness (generalized)       G-Codes - 2016-03-31 1602    Functional Limitation Mobility: Walking  and moving around   Mobility: Walking and Moving Around Goal Status [id number]) At least 1 percent but less than 20 percent impaired, limited or restricted   Mobility: Walking and Moving Around Discharge Status [phone]) At least 20 percent but less than 40 percent impaired, limited or restricted      Problem List Patient Active Problem List   Diagnosis Date Noted  . Constipation 02/05/2016  . Acute cystitis with hematuria 11/21/2015  . Preoperative examination 11/20/2015  . Need for 23-polyvalent pneumococcal polysaccharide vaccine 11/20/2015  . Change in bowel habits 09/16/2015  . Rectal bleeding 09/16/2015  . Knee pain, left 09/08/2015  . Allergic rhinitis 10/08/2014  . GAD (generalized anxiety disorder) 09/23/2014  . POSTSURGICAL HYPOTHYROIDISM 06/22/2010  . CARPAL TUNNEL SYNDROME, BILATERAL 10/22/2009  . Anemia 08/04/2009  . GERD 04/20/2009  . Hyperlipidemia LDL goal <100 05/16/2008  . Essential hypertension 05/16/2008  . OSTEOPENIA 05/16/2008  . Urinary incontinence 05/16/2008    Rayetta Humphrey, PT CLT [phone] 03-31-2016, 4:03 PM  [facility] [address] [place], [place], [address] Phone: [phone]   Fax:  [phone]  Name: MOZEL BURDETT MRN: [id number] Date of Birth: Dec 28, 1950

## 2016-03-08 NOTE — Patient Instructions (Signed)
Balance: Unilateral    Attempt to balance on left leg, eyes open. Hold _30___ seconds. Repeat __3__ times per set. Do 1____ sets per session. Do __2__ sessions per day. Perform exercise with eyes closed. Repeat to the right  http://orth.exer.us/28   Copyright  VHI. All rights reserved.  Functional Quadriceps: Chair Squat    Keeping feet flat on floor, shoulder width apart, squat as low as is comfortable. Use support as necessary. Repeat _5-10___ times per set. Do ___1_ sets per session. Do __2__ sessions per day.  http://orth.exer.us/736   Copyright  VHI. All rights reserved.  Functional Quadriceps: Sit to Stand    Sit on edge of chair, feet flat on floor. Stand upright, extending knees fully. Repeat __5-10__ times per set. Do _1___ sets per session. Do __2__ sessions per day.  http://orth.exer.us/734   Copyright  VHI. All rights reserved.  Self-Mobilization: Knee Flexion (Prone)   With 3 # on your ankle  Bring left heel toward buttocks as close as possible. Hold _3___ seconds. Relax. Repeat __10__ times per set. Do ___1_ sets per session. Do _2___ sessions per day.  http://orth.exer.us/596   Copyright  VHI. All rights reserved.  Hip Extension (Prone)    Lift left leg ___2_ inches from floor, keeping knee locked. Repeat _15___ times per set. Do ___1_ sets per session. Do ___2_ sessions per day.  http://orth.exer.us/98   Copyright  VHI. All rights reserved.

## 2016-03-10 ENCOUNTER — Ambulatory Visit (HOSPITAL_COMMUNITY): Payer: Medicare Other

## 2016-03-14 ENCOUNTER — Other Ambulatory Visit: Payer: Self-pay | Admitting: Family Medicine

## 2016-03-15 ENCOUNTER — Encounter (HOSPITAL_COMMUNITY): Payer: Medicare Other

## 2016-03-17 ENCOUNTER — Ambulatory Visit (HOSPITAL_COMMUNITY): Payer: Medicare Other | Admitting: Physical Therapy

## 2016-04-04 ENCOUNTER — Other Ambulatory Visit: Payer: Self-pay | Admitting: Family Medicine

## 2016-04-06 ENCOUNTER — Ambulatory Visit: Payer: Medicare Other | Admitting: Family Medicine

## 2016-04-12 ENCOUNTER — Ambulatory Visit (INDEPENDENT_AMBULATORY_CARE_PROVIDER_SITE_OTHER): Payer: Medicare Other | Admitting: Family Medicine

## 2016-04-12 ENCOUNTER — Encounter: Payer: Self-pay | Admitting: Family Medicine

## 2016-04-12 VITALS — BP 122/82 | HR 81 | Resp 16 | Ht 65.0 in | Wt 183.0 lb

## 2016-04-12 DIAGNOSIS — N39498 Other specified urinary incontinence: Secondary | ICD-10-CM

## 2016-04-12 DIAGNOSIS — F411 Generalized anxiety disorder: Secondary | ICD-10-CM

## 2016-04-12 DIAGNOSIS — I10 Essential (primary) hypertension: Secondary | ICD-10-CM

## 2016-04-12 DIAGNOSIS — E890 Postprocedural hypothyroidism: Secondary | ICD-10-CM | POA: Diagnosis not present

## 2016-04-12 DIAGNOSIS — Z1159 Encounter for screening for other viral diseases: Secondary | ICD-10-CM

## 2016-04-12 DIAGNOSIS — K219 Gastro-esophageal reflux disease without esophagitis: Secondary | ICD-10-CM

## 2016-04-12 DIAGNOSIS — K5909 Other constipation: Secondary | ICD-10-CM

## 2016-04-12 DIAGNOSIS — E785 Hyperlipidemia, unspecified: Secondary | ICD-10-CM | POA: Diagnosis not present

## 2016-04-12 LAB — LIPID PANEL
CHOLESTEROL: 189 mg/dL (ref 125–200)
HDL: 53 mg/dL (ref >=46)
LDL Cholesterol: 105 mg/dL (ref <130)
Total CHOL/HDL Ratio: 3.6 Ratio (ref <=5.0)
Triglycerides: 155 mg/dL — ABNORMAL HIGH (ref <150)
VLDL: 31 mg/dL — ABNORMAL HIGH (ref <30)

## 2016-04-12 LAB — COMPLETE METABOLIC PANEL WITH GFR
ALBUMIN: 3.9 g/dL (ref 3.6–5.1)
ALK PHOS: 77 U/L (ref 33–130)
ALT: 14 U/L (ref 6–29)
AST: 19 U/L (ref 10–35)
BUN: 15 mg/dL (ref 7–25)
CALCIUM: 9.3 mg/dL (ref 8.6–10.4)
CO2: 31 mmol/L (ref 20–31)
CREATININE: 1 mg/dL — AB (ref 0.50–0.99)
Chloride: 100 mmol/L (ref 98–110)
GFR, Est African American: 68 mL/min (ref >=60)
GFR, Est Non African American: 59 mL/min — ABNORMAL LOW (ref >=60)
GLUCOSE: 88 mg/dL (ref 65–99)
Potassium: 3.9 mmol/L (ref 3.5–5.3)
SODIUM: 138 mmol/L (ref 135–146)
Total Bilirubin: 0.5 mg/dL (ref 0.2–1.2)
Total Protein: 7.1 g/dL (ref 6.1–8.1)

## 2016-04-12 MED ORDER — IMIPRAMINE HCL 25 MG PO TABS: 25.0000 mg | ORAL_TABLET | Freq: Every day | ORAL | Status: DC

## 2016-04-12 MED ORDER — HYDROCHLOROTHIAZIDE 25 MG PO TABS: 25.0000 mg | ORAL_TABLET | Freq: Every day | ORAL | Status: DC

## 2016-04-12 MED ORDER — LOSARTAN POTASSIUM 25 MG PO TABS: ORAL_TABLET | ORAL | Status: DC

## 2016-04-12 MED ORDER — PANTOPRAZOLE SODIUM 40 MG PO TBEC: 40.0000 mg | DELAYED_RELEASE_TABLET | Freq: Two times a day (BID) | ORAL | Status: DC

## 2016-04-12 MED ORDER — CITALOPRAM HYDROBROMIDE 10 MG PO TABS: 10.0000 mg | ORAL_TABLET | Freq: Every day | ORAL | Status: DC

## 2016-04-12 NOTE — Progress Notes (Signed)
   Subjective:    Patient ID: Laura [name], female    DOB: 04-06-1951, 65 y.o.   MRN: [id number]  HPI    Review of Systems     Objective:   Physical Exam        Assessment & Plan:

## 2016-04-12 NOTE — Patient Instructions (Addendum)
Annual physical exam in 3.5 month, call if you need me before  Continue to ":work on yourself"   Fasting lipid, cmp and EGFR today andhep C  Thank you  for choosing [facility]. We consider it a privelige to serve you.  Delivering excellent health care in a caring and  compassionate way is our goal.  Partnering with you,  so that together we can achieve this goal is our strategy.     Please work on good  health habits so that your health will improve. 1. Commitment to daily physical activity for 30 to 60  minutes, if you are able to do this.  2. Commitment to wise food choices. Aim for half of your  food intake to be vegetable and fruit, one quarter starchy foods, and one quarter protein. Try to eat on a regular schedule  3 meals per day, snacking between meals should be limited to vegetables or fruits or small portions of nuts. 64 ounces of water per day is generally recommended, unless you have specific health conditions, like heart failure or kidney failure where you will need to limit fluid intake.  3. Commitment to sufficient and a  good quality of physical and mental rest daily, generally between 6 to 8 hours per day.  WITH PERSISTANCE AND PERSEVERANCE, THE IMPOSSIBLE , BECOMES THE NORM!

## 2016-04-13 LAB — HEPATITIS C ANTIBODY: HCV AB: NEGATIVE

## 2016-04-23 NOTE — Assessment & Plan Note (Signed)
Controlled, no change in medication  

## 2016-04-23 NOTE — Assessment & Plan Note (Signed)
Controlled, no change in medication Managed by GI 

## 2016-04-23 NOTE — Progress Notes (Signed)
   Skip EstimableJudy C Renfrow     MRN: [id number]      DOB: 03-31-1951   HPI [name] is here for follow up and re-evaluation of chronic medical conditions, medication management and review of any available recent lab and radiology data.  Preventive health is updated, specifically  Cancer screening and Immunization.   Questions or concerns regarding consultations or procedures which the PT has had in the interim are  addressed. The PT denies any adverse reactions to current medications since the last visit.  1 day h/o neck pain and spasm aggravated by movement  ROS Denies recent fever or chills. Denies sinus pressure, nasal congestion, ear pain or sore throat. Denies chest congestion, productive cough or wheezing. Denies chest pains, palpitations and leg swelling Denies abdominal pain, nausea, vomiting,diarrhea or constipation.   Denies dysuria, frequency, hesitancy or uncontrolled incontinence.  Denies headaches, seizures, numbness, or tingling. Denies depression, anxiety or insomnia. Denies skin break down or rash.   PE  BP 122/82 mmHg  Pulse 81  Resp 16  Ht 5\' 5"  (1.651 m)  Wt 183 lb (83.008 kg)  BMI 30.45 kg/m2  SpO2 97%  Patient alert and oriented and in no cardiopulmonary distress.  HEENT: No facial asymmetry, EOMI,   oropharynx pink and moist.  Neck decreased ROM with trapezius spasm no JVD, no mass.  Chest: Clear to auscultation bilaterally.  CVS: S1, S2 no murmurs, no S3.Regular rate.  ABD: Soft non tender.   Ext: No edema  MS: Adequate ROM spine, shoulders, hips and knees.  Skin: Intact, no ulcerations or rash noted.  Psych: Good eye contact, normal affect. Memory intact not anxious or depressed appearing.  CNS: CN 2-12 intact, power,  normal throughout.no focal deficits noted.   Assessment & Plan   Essential hypertension Controlled, no change in medication DASH diet and commitment to daily physical activity for a minimum of 30 minutes discussed and  encouraged, as a part of hypertension management. The importance of attaining a healthy weight is also discussed.  BP/Weight 04/12/2016 02/25/2016 02/05/2016 11/20/2015 10/06/2015 09/25/2015 09/16/2015  Systolic BP 122 128 116 122 118 124 130  Diastolic BP 82 78 71 82 75 85 79  Wt. (Lbs) 183 178 176.4 180 - 186 184.2  BMI 30.45 29.62 29.35 30.88 - 31.91 31.6        POSTSURGICAL HYPOTHYROIDISM Stable and controlled on supplement, managed by endo  GERD Controlled, no change in medication   Hyperlipidemia LDL goal <100 Hyperlipidemia:Low fat diet discussed and encouraged.   Lipid Panel  Lab Results  Component Value Date   CHOL 189 04/12/2016   HDL 53 04/12/2016   LDLCALC 105 04/12/2016   TRIG 155* 04/12/2016   CHOLHDL 3.6 04/12/2016   Needs to lower fatty food, no med change    GAD (generalized anxiety disorder) Controlled, no change in medication   Urinary incontinence Controlled, no change in medication   Constipation Controlled, no change in medication Managed by GI

## 2016-04-23 NOTE — Assessment & Plan Note (Signed)
Controlled, no change in medication DASH diet and commitment to daily physical activity for a minimum of 30 minutes discussed and encouraged, as a part of hypertension management. The importance of attaining a healthy weight is also discussed.  BP/Weight 04/12/2016 02/25/2016 02/05/2016 11/20/2015 10/06/2015 09/25/2015 09/16/2015  Systolic BP 122 128 116 122 118 124 130  Diastolic BP 82 78 71 82 75 85 79  Wt. (Lbs) 183 178 176.4 180 - 186 184.2  BMI 30.45 29.62 29.35 30.88 - 31.91 31.6

## 2016-04-23 NOTE — Assessment & Plan Note (Signed)
Stable and controlled on supplement, managed by endo

## 2016-04-23 NOTE — Assessment & Plan Note (Signed)
Hyperlipidemia:Low fat diet discussed and encouraged.   Lipid Panel  Lab Results  Component Value Date   CHOL 189 04/12/2016   HDL 53 04/12/2016   LDLCALC 105 04/12/2016   TRIG 155* 04/12/2016   CHOLHDL 3.6 04/12/2016   Needs to lower fatty food, no med change

## 2016-05-12 ENCOUNTER — Other Ambulatory Visit: Payer: Self-pay | Admitting: "Endocrinology

## 2016-05-19 ENCOUNTER — Other Ambulatory Visit: Payer: Self-pay | Admitting: Family Medicine

## 2016-06-08 DIAGNOSIS — M25562 Pain in left knee: Secondary | ICD-10-CM | POA: Diagnosis not present

## 2016-06-08 DIAGNOSIS — M25561 Pain in right knee: Secondary | ICD-10-CM | POA: Diagnosis not present

## 2016-06-12 ENCOUNTER — Other Ambulatory Visit: Payer: Self-pay | Admitting: Family Medicine

## 2016-07-01 ENCOUNTER — Other Ambulatory Visit: Payer: Self-pay | Admitting: Family Medicine

## 2016-08-01 ENCOUNTER — Encounter: Payer: Self-pay | Admitting: "Endocrinology

## 2016-08-02 ENCOUNTER — Telehealth: Payer: Self-pay | Admitting: Gastroenterology

## 2016-08-02 NOTE — Telephone Encounter (Signed)
Pt called to see if we have any samples of Linzess she could have. Please call or LMOM at [phone]

## 2016-08-02 NOTE — Telephone Encounter (Signed)
LMOM we do not hae samples of the Linzess 145 mcg now. She may cal back in a few days, or if she would like a prescription sent to the pharmacy please let us know.

## 2016-08-08 ENCOUNTER — Encounter: Payer: Self-pay | Admitting: Gastroenterology

## 2016-08-08 ENCOUNTER — Other Ambulatory Visit: Payer: Self-pay | Admitting: "Endocrinology

## 2016-08-08 ENCOUNTER — Telehealth: Payer: Self-pay | Admitting: Gastroenterology

## 2016-08-08 ENCOUNTER — Ambulatory Visit (INDEPENDENT_AMBULATORY_CARE_PROVIDER_SITE_OTHER): Payer: Medicare Other | Admitting: Gastroenterology

## 2016-08-08 VITALS — BP 133/84 | HR 75 | Temp 98.4°F | Ht 64.0 in | Wt 183.4 lb

## 2016-08-08 DIAGNOSIS — K5900 Constipation, unspecified: Secondary | ICD-10-CM

## 2016-08-08 MED ORDER — LINACLOTIDE 145 MCG PO CAPS: 145.0000 ug | ORAL_CAPSULE | Freq: Every day | ORAL | 3 refills | Status: DC

## 2016-08-08 NOTE — Assessment & Plan Note (Signed)
Doing well with Linzess 145 mcg daily. 90 day supply sent to pharmacy. As we are out of 145 mcg samples, I have provided 290 mcg samples and provided this in the interim for now as she is completely out of Linzess at home and did not have a dose for today. Will have her return in 1 year. Up-to-date on colonoscopy.

## 2016-08-08 NOTE — Progress Notes (Signed)
cc'ed to pcp °

## 2016-08-08 NOTE — Telephone Encounter (Signed)
Pt was seen by AB today and called at 1203 to let us know that a 90 day supply of Linzess would cost her over $200 and she would rather have samples if we had any. Please advise and call [phone]

## 2016-08-08 NOTE — Progress Notes (Signed)
Referring Provider: Kerri Perches, MD Primary Care Physician:  Syliva Overman, MD  Primary GI: Dr. Darrick Penna   Chief Complaint  Patient presents with  . Follow-up    bowel urgency    HPI:   Laura [name] is a 65 y.o. female presenting today with a history of constipation. Colonoscopy on file from Dec 2016. Felt to have internal hemorrhoids vs anal fissure at that time as culprit for rectal bleeding. Here for 6 month follow-up.   Chief complaint: bowel urgency. States this morning felt like she had to go to the bathroom but couldn't go. Not true bowel urgency. States her abdomen is "making a fuss". Just started today. Tries to avoid greasy food. However, she had an episode of diarrhea and heaving last Friday in September, then it resolved. Eating fruits and veggies during the fall. Last night at [employer] burger and fries. This morning wants to go to the bathroom but can't. Hasn't taken Linzess this morning. Only has one left. Takes it every day. Works really well for her. 290 mcg was too strong. 145 mcg works well.    Past Medical History:  Diagnosis Date  . Anemia   . Anxiety   . Cystitis   . Depression   . Gastritis   . GERD (gastroesophageal reflux disease)   . Hashimoto's thyroiditis    Hx   . Hyperlipidemia   . Hypertension   . Hypothyroidism   . Osteopenia   . Shoulder pain   . Urinary incontinence     Past Surgical History:  Procedure Laterality Date  . Bilateral tubal ligation    . BREAST EXCISIONAL BIOPSY  2010   Excisional biopsy of benign left breast mass -lopoma   . BREAST SURGERY     left nreast biopsy  . COLONOSCOPY  08/26/2011   SLF: 1. Internal hemorrhoids  . COLONOSCOPY N/A 10/06/2015   ZOX:WRUE fissure or internal hemorrhoids/mild sigmoid colitis. benign colonic path   . colonscopy  2005   Dr. Katrinka Blazing  . ESOPHAGOGASTRODUODENOSCOPY  12/2009   chronic gastritis  . left knee surgery Left March 1,2017  . Resection of left lobe of thyroid    . right  carpal tunnel release    . rt. knee athroscopy  2004  . TOTAL ABDOMINAL HYSTERECTOMY  1994  . UMBILICAL HERNIA REPAIR    . Urethral dilation for stenosis  2009    Current Outpatient Prescriptions  Medication Sig Dispense Refill  . Biotin 1000 MCG tablet Take 1,000 mcg by mouth 3 (three) times daily.    . Biotin w/ Vitamins C & E (HAIR/SKIN/NAILS PO) Take 1 capsule by mouth daily.    . citalopram (CELEXA) 10 MG tablet Take 1 tablet (10 mg total) by mouth daily. 90 tablet 1  . Ferrous Sulfate (SLOW FE PO) Take 1 tablet by mouth daily.    . ferrous sulfate 325 (65 FE) MG tablet Take 325 mg by mouth 2 (two) times daily with a meal.    . hydrochlorothiazide (HYDRODIURIL) 25 MG tablet TAKE 1 TABLET BY MOUTH EVERY DAY 90 tablet 0  . imipramine (TOFRANIL) 25 MG tablet Take 1 tablet (25 mg total) by mouth at bedtime. 90 tablet 1  . imipramine (TOFRANIL) 25 MG tablet TAKE 1 TABLET BY MOUTH EVERY NIGHT AT BEDTIME 90 tablet 0  . levothyroxine (SYNTHROID, LEVOTHROID) 100 MCG tablet TAKE 1 TABLET BY MOUTH DAILY BEFORE BREAKFAST 90 tablet 0  . linaclotide (LINZESS) 290 MCG CAPS capsule Take 290 mcg  by mouth as needed.    Marland Kitchen. losartan (COZAAR) 25 MG tablet TAKE 1 TABLET(25 MG) BY MOUTH DAILY 90 tablet 1  . lovastatin (MEVACOR) 40 MG tablet TAKE 2 TABLETS BY MOUTH EVERY NIGHT AT BEDTIME FOR CHOLESTEROL 60 tablet 5  . Multiple Vitamin (MULTIVITAMIN) tablet Take 1 tablet by mouth daily.    . pantoprazole (PROTONIX) 40 MG tablet Take 1 tablet (40 mg total) by mouth 2 (two) times daily. 180 tablet 1  . pantoprazole (PROTONIX) 40 MG tablet TAKE 1 TABLET BY MOUTH TWICE DAILY 180 tablet 0  . potassium chloride (K-DUR,KLOR-CON) 10 MEQ tablet TAKE 1 TABLET BY MOUTH TWICE DAILY 180 tablet 0  . vitamin E 400 UNIT capsule Take 400 Units by mouth daily.    . Linaclotide (LINZESS) 145 MCG CAPS capsule Take 145 mcg by mouth daily.     No current facility-administered medications for this visit.     Allergies as of  08/08/2016 - Review Complete 08/08/2016  Allergen Reaction Noted  . Dilaudid [hydromorphone hcl]  03/01/2011  . Levaquin [levofloxacin] Other (See Comments) 07/28/2014  . Nsaids  01/13/2010    Family History  Problem Relation Age of Onset  . Colon cancer Neg Hx   . Anesthesia problems Neg Hx   . Hypotension Neg Hx   . Malignant hyperthermia Neg Hx   . Pseudochol deficiency Neg Hx     Social History   Social History  . Marital status: Married    Spouse name: N/A  . Number of children: 3  . Years of education: N/A   Occupational History  . 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 Smoking status: Former Smoker    Packs/day: 0.50    Types: Cigarettes    Quit date: 08/25/1972  . Smokeless tobacco: None  . Alcohol use No  . Drug use: No  . Sexual activity: Not Currently   Other Topics Concern  . None   Social History Narrative  . None    Review of Systems: As mentioned in HPI   Physical Exam: BP 133/84   Pulse 75   Temp 98.4 F (36.9 C) (Oral)   Ht 5\' 4"  (1.626 m)   Wt 183 lb 6.4 oz (83.2 kg)   BMI 31.48 kg/m  General:   Alert and oriented. No distress noted. Pleasant and cooperative.  Head:  Normocephalic and atraumatic. Eyes:  Conjuctiva clear without scleral icterus. Abdomen:  +BS, soft, non-tender and non-distended. No rebound or guarding. No HSM or masses noted. Msk:  Symmetrical without gross deformities. Normal posture. Extremities:  Without edema. Neurologic:  Alert and  oriented x4;  grossly normal neurologically. Psych:  Alert and cooperative. Normal mood and affect.

## 2016-08-08 NOTE — Patient Instructions (Signed)
Continue Linzess 145 mcg once daily. I sent in a prescription and samples for you.    We will see you back in 1 year.

## 2016-08-08 NOTE — Telephone Encounter (Signed)
I called and reminded the pt that we do not have samples of the Linzess 145 mcg.

## 2016-08-10 ENCOUNTER — Encounter: Payer: Self-pay | Admitting: Family Medicine

## 2016-08-10 ENCOUNTER — Ambulatory Visit (INDEPENDENT_AMBULATORY_CARE_PROVIDER_SITE_OTHER): Payer: Medicare Other | Admitting: Family Medicine

## 2016-08-10 VITALS — BP 130/84 | HR 73 | Ht 65.0 in | Wt 187.0 lb

## 2016-08-10 DIAGNOSIS — Z1211 Encounter for screening for malignant neoplasm of colon: Secondary | ICD-10-CM

## 2016-08-10 DIAGNOSIS — E785 Hyperlipidemia, unspecified: Secondary | ICD-10-CM

## 2016-08-10 DIAGNOSIS — Z1231 Encounter for screening mammogram for malignant neoplasm of breast: Secondary | ICD-10-CM

## 2016-08-10 DIAGNOSIS — Z0000 Encounter for general adult medical examination without abnormal findings: Secondary | ICD-10-CM | POA: Diagnosis not present

## 2016-08-10 DIAGNOSIS — I10 Essential (primary) hypertension: Secondary | ICD-10-CM

## 2016-08-10 DIAGNOSIS — E559 Vitamin D deficiency, unspecified: Secondary | ICD-10-CM

## 2016-08-10 DIAGNOSIS — Z23 Encounter for immunization: Secondary | ICD-10-CM | POA: Diagnosis not present

## 2016-08-10 LAB — POC HEMOCCULT BLD/STL (OFFICE/1-CARD/DIAGNOSTIC): Fecal Occult Blood, POC: NEGATIVE

## 2016-08-10 NOTE — Assessment & Plan Note (Signed)
After obtaining informed consent, the vaccine is  administered by LPN.  

## 2016-08-10 NOTE — Assessment & Plan Note (Signed)

## 2016-08-10 NOTE — Patient Instructions (Signed)
Annual wellness Nov 5 or after, call if you need me sooner  MD follow up in 4.5 months  Mammogram due in November, we will schedule per your request  Labs today  Flu vaccine today     It is important that you exercise regularly at least 30 minutes 5 times a week. If you develop chest pain, have severe difficulty breathing, or feel very tired, stop exercising immediately and seek medical attention    Please call if you decide you need referral to urology  Thank you  for choosing [facility]. We consider it a privelige to serve you.  Delivering excellent health care in a caring and  compassionate way is our goal.  Partnering with you,  so that together we can achieve this goal is our strategy.

## 2016-08-12 ENCOUNTER — Other Ambulatory Visit: Payer: Self-pay | Admitting: "Endocrinology

## 2016-08-12 DIAGNOSIS — E559 Vitamin D deficiency, unspecified: Secondary | ICD-10-CM | POA: Diagnosis not present

## 2016-08-12 DIAGNOSIS — E890 Postprocedural hypothyroidism: Secondary | ICD-10-CM | POA: Diagnosis not present

## 2016-08-12 DIAGNOSIS — Z0000 Encounter for general adult medical examination without abnormal findings: Secondary | ICD-10-CM | POA: Diagnosis not present

## 2016-08-12 DIAGNOSIS — R7301 Impaired fasting glucose: Secondary | ICD-10-CM | POA: Diagnosis not present

## 2016-08-12 DIAGNOSIS — E785 Hyperlipidemia, unspecified: Secondary | ICD-10-CM | POA: Diagnosis not present

## 2016-08-12 DIAGNOSIS — I10 Essential (primary) hypertension: Secondary | ICD-10-CM | POA: Diagnosis not present

## 2016-08-12 LAB — LIPID PANEL
CHOL/HDL RATIO: 3.6 ratio (ref <=5.0)
CHOLESTEROL: 218 mg/dL — AB (ref 125–200)
HDL: 61 mg/dL (ref >=46)
LDL Cholesterol: 133 mg/dL — ABNORMAL HIGH (ref <130)
TRIGLYCERIDES: 120 mg/dL (ref <150)
VLDL: 24 mg/dL (ref <30)

## 2016-08-12 LAB — COMPREHENSIVE METABOLIC PANEL
ALBUMIN: 3.8 g/dL (ref 3.6–5.1)
ALT: 23 U/L (ref 6–29)
AST: 20 U/L (ref 10–35)
Alkaline Phosphatase: 64 U/L (ref 33–130)
BUN: 13 mg/dL (ref 7–25)
CALCIUM: 9.5 mg/dL (ref 8.6–10.4)
CHLORIDE: 99 mmol/L (ref 98–110)
CO2: 32 mmol/L — ABNORMAL HIGH (ref 20–31)
CREATININE: 1.07 mg/dL — AB (ref 0.50–0.99)
Glucose, Bld: 88 mg/dL (ref 65–99)
Potassium: 3.6 mmol/L (ref 3.5–5.3)
SODIUM: 138 mmol/L (ref 135–146)
TOTAL PROTEIN: 6.8 g/dL (ref 6.1–8.1)
Total Bilirubin: 0.5 mg/dL (ref 0.2–1.2)

## 2016-08-12 LAB — HEMOGLOBIN A1C
HEMOGLOBIN A1C: 5.4 % (ref <5.7)
MEAN PLASMA GLUCOSE: 108 mg/dL

## 2016-08-12 LAB — TSH: TSH: 13.11 mIU/L — ABNORMAL HIGH

## 2016-08-12 LAB — T4, FREE: FREE T4: 0.9 ng/dL (ref 0.8–1.8)

## 2016-08-13 LAB — VITAMIN D 25 HYDROXY (VIT D DEFICIENCY, FRACTURES): VIT D 25 HYDROXY: 30 ng/mL (ref 30–100)

## 2016-08-14 ENCOUNTER — Other Ambulatory Visit: Payer: Self-pay | Admitting: Family Medicine

## 2016-08-14 NOTE — Progress Notes (Signed)
    Laura [name]     MRN: [id number]      DOB: Dec 05, 1950  HPI: Patient is in for annual physical exam. C/o poor urinary stream and incomplete emptying but does not want to see urology at this time  labs, will be  Reviewed.once drawn today after visit Immunization is reviewed , and  updated    PE: Pleasant  female, alert and oriented x 3, in no cardio-pulmonary distress. Afebrile. HEENT No facial trauma or asymetry. Sinuses non tender.  Extra occullar muscles intact, pupils equally reactive to light. External ears normal, tympanic membranes clear. Oropharynx moist, no exudate. Neck: supple, no adenopathy,JVD or thyromegaly.No bruits.  Chest: Clear to ascultation bilaterally.No crackles or wheezes. Non tender to palpation  Breast: No asymetry,no masses or lumps. No tenderness. No nipple discharge or inversion. No axillary or supraclavicular adenopathy  Cardiovascular system; Heart sounds normal,  S1 and  S2 ,no S3.  No murmur, or thrill. Apical beat not displaced Peripheral pulses normal.  Abdomen: Soft, non tender, no organomegaly or masses. No bruits. Bowel sounds normal. No guarding, tenderness or rebound.  Rectal:  Normal sphincter tone. No rectal mass. Guaiac negative stool.  GU: External genitalia normal female genitalia , normal female distribution of hair. No lesions. Urethral meatus normal in size, no  Prolapse, no lesions visibly  Present. Bladder non tender. Vagina pink and moist , with no visible lesions , discharge present . Adequate pelvic support no  cystocele or rectocele noted  Uterus absent no adnexal masses, no  adnexal tenderness.   Musculoskeletal exam: Full ROM of spine, hips , shoulders and knees. No deformity ,swelling or crepitus noted. No muscle wasting or atrophy.   Neurologic: Cranial nerves 2 to 12 intact. Power, tone ,sensation and reflexes normal throughout. No disturbance in gait. No tremor.  Skin: Intact, no ulceration,  erythema , scaling or rash noted. Pigmentation normal throughout  Psych; Normal mood and affect. Judgement and concentration normal   Assessment & Plan:  Annual physical exam Annual exam as documented. Counseling done  re healthy lifestyle involving commitment to 150 minutes exercise per week, heart healthy diet, and attaining healthy weight.The importance of adequate sleep also discussed. Regular seat belt use and home safety, is also discussed. Changes in health habits are decided on by the patient with goals and time frames  set for achieving them. Immunization and cancer screening needs are specifically addressed at this visit.   Need for prophylactic vaccination and inoculation against influenza After obtaining informed consent, the vaccine is  administered by LPN.

## 2016-08-23 ENCOUNTER — Other Ambulatory Visit: Payer: Self-pay

## 2016-08-23 ENCOUNTER — Telehealth: Payer: Self-pay

## 2016-08-23 DIAGNOSIS — E785 Hyperlipidemia, unspecified: Secondary | ICD-10-CM

## 2016-08-23 DIAGNOSIS — I10 Essential (primary) hypertension: Secondary | ICD-10-CM

## 2016-08-23 DIAGNOSIS — E890 Postprocedural hypothyroidism: Secondary | ICD-10-CM

## 2016-08-23 MED ORDER — ATORVASTATIN CALCIUM 20 MG PO TABS: 20.0000 mg | ORAL_TABLET | Freq: Every day | ORAL | 5 refills | Status: DC

## 2016-08-23 NOTE — Telephone Encounter (Signed)
-----   Message from Kerri PerchesMargaret E Simpson, MD sent at 08/14/2016  5:54 AM EDT ----- Needs to reduce fat in diet, stop lovastatin, start lipitor 40 mg at edtime, I have entered med change, pls follow through after you spk with her Needs rept fasting lipid and cmp in 3.5 months also Labs otherwise good

## 2016-08-26 ENCOUNTER — Ambulatory Visit (INDEPENDENT_AMBULATORY_CARE_PROVIDER_SITE_OTHER): Payer: Medicare Other | Admitting: "Endocrinology

## 2016-08-26 ENCOUNTER — Encounter: Payer: Self-pay | Admitting: "Endocrinology

## 2016-08-26 ENCOUNTER — Ambulatory Visit: Payer: Medicare Other | Admitting: "Endocrinology

## 2016-08-26 VITALS — BP 130/81 | HR 84 | Ht 65.0 in | Wt 187.0 lb

## 2016-08-26 DIAGNOSIS — E890 Postprocedural hypothyroidism: Secondary | ICD-10-CM

## 2016-08-26 MED ORDER — LEVOTHYROXINE SODIUM 125 MCG PO TABS: 125.0000 ug | ORAL_TABLET | Freq: Every day | ORAL | 6 refills | Status: DC

## 2016-08-26 NOTE — Progress Notes (Signed)
HPI  [name] is a 65 y.o.-year-old female,  She has  medical h/o Goiter s/p Lt sided hemithyroidectomy 20+ years ago,  unremarkable thyroid u/s in 2012.   She remained on levothyroxine 100 g.  She is compliant taking her medication properly.  Her most recent thyroid function tests are consistent with appropriate replacement.   She is regaining weight , complains of fatigue,  cold intolerance.  Pt denies feeling nodules in neck, hoarseness, dysphagia/odynophagia, SOB with lying down.   No h/o radiation tx to head or neck. No recent use of iodine supplements.    ROS: Constitutional: + weight loss, no fatigue, no subjective hyperthermia. She c/o cold hands . Eyes: no blurry vision, no xerophthalmia ENT: no sore throat, no nodules palpated in throat, no dysphagia/odynophagia, no hoarseness Cardiovascular: no CP/SOB/palpitations/leg swelling Respiratory: no cough/SOB Gastrointestinal: no N/V/D/C Musculoskeletal: no muscle/joint aches Skin: no rashes Neurological: no tremors/numbness/tingling/dizziness Psychiatric: no depression/anxiety  PE: BP 130/81   Pulse 84   Ht 5\' 5"  (1.651 m)   Wt 187 lb (84.8 kg)   BMI 31.12 kg/m  Wt Readings from Last 3 Encounters:  08/26/16 187 lb (84.8 kg)  08/10/16 187 lb (84.8 kg)  08/08/16 183 lb 6.4 oz (83.2 kg)   Constitutional: overweight, in NAD Eyes: PERRLA, EOMI, no exophthalmos ENT: moist mucous membranes, no thyromegaly, no cervical lymphadenopathy Cardiovascular: RRR, No MRG Respiratory: CTA B Gastrointestinal: abdomen soft, NT, ND, BS+ Musculoskeletal: no deformities, strength intact in all 4 Skin: moist, warm, no rashes Neurological: no tremor with outstretched hands, DTR normal in all 4  Recent Results (from the past 2160 hour(s))  POC Hemoccult Bld/Stl (1-Cd Office Dx)     Status: None   Collection Time: 08/10/16 12:00 PM  Result Value Ref Range   Card #1 Date 08/10/2016    Fecal Occult Blood, POC Negative Negative     Comment: 51/851 2l 7/18 dev 62348h 8/19  Lipid panel     Status: Abnormal   Collection Time: 08/12/16  9:19 AM  Result Value Ref Range   Cholesterol 218 (H) 125 - 200 mg/dL   Triglycerides 409120 <811<150 mg/dL   HDL 61 >=91>=46 mg/dL   Total CHOL/HDL Ratio 3.6 <=5.0 Ratio   VLDL 24 <30 mg/dL   LDL Cholesterol 478133 (H) <130 mg/dL    Comment:   Total Cholesterol/HDL Ratio:CHD Risk                        Coronary Heart Disease Risk Table                                        Men       Women          1/2 Average Risk              3.4        3.3              Average Risk              5.0        4.4           2X Average Risk              9.6        7.1           3X Average Risk  23.4       11.0 Use the calculated Patient Ratio above and the CHD Risk table  to determine the patient's CHD Risk.   Comprehensive metabolic panel     Status: Abnormal   Collection Time: 08/12/16  9:19 AM  Result Value Ref Range   Sodium 138 135 - 146 mmol/L   Potassium 3.6 3.5 - 5.3 mmol/L   Chloride 99 98 - 110 mmol/L   CO2 32 (H) 20 - 31 mmol/L   Glucose, Bld 88 65 - 99 mg/dL   BUN 13 7 - 25 mg/dL   Creat 1.61 (H) 0.96 - 0.99 mg/dL    Comment:   For patients > or = 66 years of age: The upper reference limit for Creatinine is approximately 13% higher for people identified as African-American.      Total Bilirubin 0.5 0.2 - 1.2 mg/dL   Alkaline Phosphatase 64 33 - 130 U/L   AST 20 10 - 35 U/L   ALT 23 6 - 29 U/L   Total Protein 6.8 6.1 - 8.1 g/dL   Albumin 3.8 3.6 - 5.1 g/dL   Calcium 9.5 8.6 - 04.5 mg/dL  Hemoglobin W0J     Status: None   Collection Time: 08/12/16  9:19 AM  Result Value Ref Range   Hgb A1c MFr Bld 5.4 <5.7 %    Comment:   For the purpose of screening for the presence of diabetes:   <5.7%       Consistent with the absence of diabetes 5.7-6.4 %   Consistent with increased risk for diabetes (prediabetes) >=6.5 %     Consistent with diabetes   This assay result is  consistent with a decreased risk of diabetes.   Currently, no consensus exists regarding use of hemoglobin A1c for diagnosis of diabetes in children.   According to American Diabetes Association (ADA) guidelines, hemoglobin A1c <7.0% represents optimal control in non-pregnant diabetic patients. Different metrics may apply to specific patient populations. Standards of Medical Care in Diabetes (ADA).      Mean Plasma Glucose 108 mg/dL  VITAMIN D 25 Hydroxy (Vit-D Deficiency, Fractures)     Status: None   Collection Time: 08/12/16  9:19 AM  Result Value Ref Range   Vit D, 25-Hydroxy 30 30 - 100 ng/mL    Comment: Vitamin D Status           25-OH Vitamin D        Deficiency                <20 ng/mL        Insufficiency         20 - 29 ng/mL        Optimal             > or = 30 ng/mL   For 25-OH Vitamin D testing on patients on D2-supplementation and patients for whom quantitation of D2 and D3 fractions is required, the QuestAssureD 25-OH VIT D, (D2,D3), LC/MS/MS is recommended: order code 81191 (patients > 2 yrs).   TSH     Status: Abnormal   Collection Time: 08/12/16  9:20 AM  Result Value Ref Range   TSH 13.11 (H) mIU/L    Comment:   Reference Range   > or = 20 Years  0.40-4.50   Pregnancy Range First trimester  0.26-2.66 Second trimester 0.55-2.73 Third trimester  0.43-2.91     T4, free     Status: None   Collection Time:  08/12/16  9:20 AM  Result Value Ref Range   Free T4 0.9 0.8 - 1.8 ng/dL   ASSESSMENT: 1. Hypothyroidism, postsurgical  PLAN:  - Her thyroid function tests are abnormal, could be Biotin effect . However, she will benefit from slight increase in her thyroid hormone . - I would increase levothyroxine to 125 g by mouth every morning.   - We discussed about correct intake of levothyroxine, at fasting, with water, separated by at least 30 minutes from breakfast, and separated by more than 4 hours from calcium, iron, multivitamins, acid reflux  medications (PPIs). I advised her to discontinue Biotin. -Patient is made aware of the fact that thyroid hormone replacement is needed for life, dose to be adjusted by periodic monitoring of thyroid function tests. - Will check thyroid tests before next visit: TSH, free T4 in 6 months. -Due to absence of clinical goiter, no need for thyroid ultrasound for now.  Marquis Lunch, MD Phone: [phone]  Fax: [phone]   08/26/2016, 11:30 AM

## 2016-08-29 ENCOUNTER — Telehealth: Payer: Self-pay | Admitting: Gastroenterology

## 2016-08-29 NOTE — Telephone Encounter (Signed)
Pt came by the office and said she is feeling bloated and stomach rumbling.  She spaced her Linzess samples out and has not been taking everyday.  I gave her #12 samples and told her to get back on track taking daily. Let us know if she continues to have problems.

## 2016-09-06 ENCOUNTER — Ambulatory Visit (INDEPENDENT_AMBULATORY_CARE_PROVIDER_SITE_OTHER): Payer: Medicare Other

## 2016-09-06 VITALS — BP 142/86 | HR 74 | Resp 18 | Ht 65.0 in | Wt 183.0 lb

## 2016-09-06 DIAGNOSIS — Z0000 Encounter for general adult medical examination without abnormal findings: Secondary | ICD-10-CM | POA: Diagnosis not present

## 2016-09-06 NOTE — Patient Instructions (Signed)
Thank you for choosing [facility] for your health care needs  The Annual Wellness Visit is designed to allow Laura [name] the chance to assist you in preserving and improving you health.   Dr. Lodema HongSimpson will see you back in 4 months  If any labs will be mailed to you with the date to have them drawn   Please look over the information on anemia (iron deficiency)

## 2016-09-10 NOTE — Progress Notes (Signed)
Subjective:    Laura EstimableJudy C [name] is a 65 y.o. female who presents for Medicare Annual/Subsequent preventive examination.  Preventive Screening-Counseling & Management  Tobacco History  Smoking Status  . Former Smoker  . Packs/day: 0.50  . Types: Cigarettes  . Quit date: 08/25/1972  Smokeless Tobacco  . Never Used     Current Problems (verified) Patient Active Problem List   Diagnosis Date Noted  . Need for prophylactic vaccination and inoculation against influenza 08/10/2016  . Constipation 02/05/2016  . Knee pain, left 09/08/2015  . Allergic rhinitis 10/08/2014  . GAD (generalized anxiety disorder) 09/23/2014  . Annual physical exam 06/05/2014  . POSTSURGICAL HYPOTHYROIDISM 06/22/2010  . Anemia 08/04/2009  . GERD 04/20/2009  . Hyperlipidemia LDL goal <100 05/16/2008  . Essential hypertension 05/16/2008  . OSTEOPENIA 05/16/2008  . Urinary incontinence 05/16/2008    Medications Prior to Visit Current Outpatient Prescriptions on File Prior to Visit  Medication Sig Dispense Refill  . atorvastatin (LIPITOR) 20 MG tablet Take 1 tablet (20 mg total) by mouth daily. 30 tablet 5  . citalopram (CELEXA) 10 MG tablet Take 1 tablet (10 mg total) by mouth daily. 90 tablet 1  . ferrous sulfate 325 (65 FE) MG tablet Take 325 mg by mouth 2 (two) times daily with a meal.    . hydrochlorothiazide (HYDRODIURIL) 25 MG tablet TAKE 1 TABLET BY MOUTH EVERY DAY 90 tablet 0  . imipramine (TOFRANIL) 25 MG tablet TAKE 1 TABLET BY MOUTH EVERY NIGHT AT BEDTIME 90 tablet 0  . levothyroxine (SYNTHROID, LEVOTHROID) 125 MCG tablet Take 1 tablet (125 mcg total) by mouth daily before breakfast. 30 tablet 6  . linaclotide (LINZESS) 145 MCG CAPS capsule Take 1 capsule (145 mcg total) by mouth daily. 90 capsule 3  . losartan (COZAAR) 25 MG tablet TAKE 1 TABLET(25 MG) BY MOUTH DAILY 90 tablet 1  . Multiple Vitamin (MULTIVITAMIN) tablet Take 1 tablet by mouth daily.    . pantoprazole (PROTONIX) 40 MG tablet Take  1 tablet (40 mg total) by mouth 2 (two) times daily. 180 tablet 1  . potassium chloride (K-DUR,KLOR-CON) 10 MEQ tablet TAKE 1 TABLET BY MOUTH TWICE DAILY 180 tablet 0  . vitamin E 400 UNIT capsule Take 400 Units by mouth daily.     No current facility-administered medications on file prior to visit.     Current Medications (verified) Current Outpatient Prescriptions  Medication Sig Dispense Refill  . atorvastatin (LIPITOR) 20 MG tablet Take 1 tablet (20 mg total) by mouth daily. 30 tablet 5  . citalopram (CELEXA) 10 MG tablet Take 1 tablet (10 mg total) by mouth daily. 90 tablet 1  . ferrous sulfate 325 (65 FE) MG tablet Take 325 mg by mouth 2 (two) times daily with a meal.    . hydrochlorothiazide (HYDRODIURIL) 25 MG tablet TAKE 1 TABLET BY MOUTH EVERY DAY 90 tablet 0  . imipramine (TOFRANIL) 25 MG tablet TAKE 1 TABLET BY MOUTH EVERY NIGHT AT BEDTIME 90 tablet 0  . levothyroxine (SYNTHROID, LEVOTHROID) 125 MCG tablet Take 1 tablet (125 mcg total) by mouth daily before breakfast. 30 tablet 6  . linaclotide (LINZESS) 145 MCG CAPS capsule Take 1 capsule (145 mcg total) by mouth daily. 90 capsule 3  . losartan (COZAAR) 25 MG tablet TAKE 1 TABLET(25 MG) BY MOUTH DAILY 90 tablet 1  . Multiple Vitamin (MULTIVITAMIN) tablet Take 1 tablet by mouth daily.    . pantoprazole (PROTONIX) 40 MG tablet Take 1 tablet (40 mg total) by mouth  2 (two) times daily. 180 tablet 1  . potassium chloride (K-DUR,KLOR-CON) 10 MEQ tablet TAKE 1 TABLET BY MOUTH TWICE DAILY 180 tablet 0  . vitamin E 400 UNIT capsule Take 400 Units by mouth daily.     No current facility-administered medications for this visit.      Allergies (verified) Dilaudid [hydromorphone hcl]; Levaquin [levofloxacin]; and Nsaids   PAST HISTORY  Family History Family History  Problem Relation Age of Onset  . Colon cancer Neg Hx   . Anesthesia problems Neg Hx   . Hypotension Neg Hx   . Malignant hyperthermia Neg Hx   . Pseudochol  deficiency Neg Hx     Social History Social History  Substance Use Topics  . Smoking status: Former Smoker    Packs/day: 0.50    Types: Cigarettes    Quit date: 08/25/1972  . Smokeless tobacco: Never Used  . Alcohol use No     Are there smokers in your home (other than you)? No  Risk Factors Current exercise habits: The patient does not participate in regular exercise at present.  Dietary issues discussed: iron rich diet, cholesterol lowering diet    Cardiac risk factors: advanced age (older than 99 for men, 32 for women), dyslipidemia, hypertension and sedentary lifestyle.  Depression Screen (Note: if answer to either of the following is "Yes", a more complete depression screening is indicated)   Over the past two weeks, have you felt down, depressed or hopeless? No  Over the past two weeks, have you felt little interest or pleasure in doing things? No  Have you lost interest or pleasure in daily life? No  Do you often feel hopeless? No  Do you cry easily over simple problems? No  Activities of Daily Living In your present state of health, do you have any difficulty performing the following activities?:  Driving? No Managing money?  No Feeding yourself? No Getting from bed to chair? No Climbing a flight of stairs? No Preparing food and eating?: No Bathing or showering? No Getting dressed: No Getting to the toilet? No Using the toilet:No Moving around from place to place: No In the past year have you fallen or had a near fall?:No   Are you sexually active?  Yes  Do you have more than one partner?  No  Hearing Difficulties: No Do you often ask people to speak up or repeat themselves? No Do you experience ringing or noises in your ears? No Do you have difficulty understanding soft or whispered voices? No   Do you feel that you have a problem with memory? No  Do you often misplace items? No  Do you feel safe at home?  Yes  Cognitive Testing  Alert? Yes  Normal  Appearance?Yes  Oriented to person? Yes  Place? Yes   Time? Yes  Recall of three objects?  Yes  Can perform simple calculations? Yes  Displays appropriate judgment?Yes  Can read the correct time from a watch face?Yes   Advanced Directives have been discussed with the patient? No  List the Names of Other Physician/Practitioners you currently use: 1.  Dr. Fransico Him (endocrinology) 2.  Dr. Darrick Penna (GI)  3.  Dr. Ranell Patrick (orthopedics)   Indicate any recent Medical Services you may have received from other than [facility] providers in the past year (date may be approximate).  Immunization History  Administered Date(s) Administered  . Influenza Split 07/17/2012  . Influenza Whole 10/08/2003, 07/01/2010, 07/14/2011  . Influenza,inj,Quad PF,36+ Mos 08/14/2013, 08/28/2014, 09/03/2015, 08/10/2016  .  Pneumococcal Conjugate-13 06/05/2014  . Pneumococcal Polysaccharide-23 11/20/2015  . Td 04/21/2004  . Tdap 06/05/2014  . Zoster 03/31/2011    Screening Tests Health Maintenance  Topic Date Due  . MAMMOGRAM  09/16/2017  . TETANUS/TDAP  06/05/2024  . COLONOSCOPY  10/05/2025  . INFLUENZA VACCINE  Completed  . DEXA SCAN  Completed  . ZOSTAVAX  Completed  . Hepatitis C Screening  Completed  . PNA vac Low Risk Adult  Completed    All answers were reviewed with the patient and necessary referrals were made:  Durwin NoraSlade, Gayle Collard Blackwell, LPN   16/10/960411/08/2016   History reviewed: allergies, current medications, past family history, past medical history, past social history, past surgical history and problem list  Review of Systems A comprehensive review of systems was negative.    Objective:     Vision by Snellen chart: right eye:20/25, left eye:20/25  Body mass index is 30.45 kg/m. BP (!) 142/86   Pulse 74   Resp 18   Ht 5\' 5"  (1.651 m)   Wt 183 lb (83 kg)   SpO2 97%   BMI 30.45 kg/m   No exam performed today, annual wellness visit without physical exam.     Assessment:   Medicare annual  wellness visit, subsequent Annual exam as documented. Counseling done  re healthy lifestyle involving commitment to 150 minutes exercise per week, heart healthy diet, and attaining healthy weight.The importance of adequate sleep also discussed. Regular seat belt use and home safety, is also discussed. Changes in health habits are decided on by the patient with goals and time frames  set for achieving them. Immunization and cancer screening needs are specifically addressed at this visit.        Plan:     During the course of the visit the patient was educated and counseled about appropriate screening and preventive services including:    Nutrition counseling   Diet review for nutrition referral? Yes ____  Not Indicated __x__   Patient Instructions (the written plan) was given to the patient.  Medicare Attestation I have personally reviewed: The patient's medical and social history Their use of alcohol, tobacco or illicit drugs Their current medications and supplements The patient's functional ability including ADLs,fall risks, home safety risks, cognitive, and hearing and visual impairment Diet and physical activities Evidence for depression or mood disorders  The patient's weight, height, BMI, and visual acuity have been recorded in the chart.  I have made referrals, counseling, and provided education to the patient based on review of the above and I have provided the patient with a written personalized care plan for preventive services.     Kandis FantasiaSlade, Maybree Riling Fort Johnson[place]well, [place]   [id number]/811911/08/2016

## 2016-09-11 DIAGNOSIS — Z0000 Encounter for general adult medical examination without abnormal findings: Secondary | ICD-10-CM | POA: Insufficient documentation

## 2016-09-11 NOTE — Assessment & Plan Note (Signed)

## 2016-09-19 ENCOUNTER — Telehealth: Payer: Self-pay

## 2016-09-19 ENCOUNTER — Ambulatory Visit (HOSPITAL_COMMUNITY): Payer: Medicare Other

## 2016-09-19 NOTE — Telephone Encounter (Signed)
If she would like to continue with just Miralax and that works well for her, that is fine with me.

## 2016-09-19 NOTE — Telephone Encounter (Signed)
Pt came by the office and said the Linzess 145 mcg has not been working. ( She has also tried Linzess 290 mcg and said that did not work either.   She has been very miserable and took some stool softners also and finally after drinking a hot cup of coffee on Sunday she had a VERY GOOD BM and felt so much better.  She has tried Miralax before and that worked Firefightergreat.  I reminded her that is OTC and she can get some and try.   Routing to Kotlik[place] for any recommendations.

## 2016-09-20 NOTE — Telephone Encounter (Signed)
LMOM to call.

## 2016-09-21 NOTE — Telephone Encounter (Signed)
LMOM to call. Also mailed a letter with the information.

## 2016-09-26 ENCOUNTER — Encounter: Payer: Self-pay | Admitting: Family Medicine

## 2016-09-26 ENCOUNTER — Ambulatory Visit (INDEPENDENT_AMBULATORY_CARE_PROVIDER_SITE_OTHER): Payer: Medicare Other | Admitting: Family Medicine

## 2016-09-26 VITALS — BP 138/84 | HR 93 | Temp 98.3°F | Resp 16 | Ht 65.0 in | Wt 182.0 lb

## 2016-09-26 DIAGNOSIS — J029 Acute pharyngitis, unspecified: Secondary | ICD-10-CM | POA: Diagnosis not present

## 2016-09-26 DIAGNOSIS — J0110 Acute frontal sinusitis, unspecified: Secondary | ICD-10-CM

## 2016-09-26 DIAGNOSIS — E559 Vitamin D deficiency, unspecified: Secondary | ICD-10-CM

## 2016-09-26 DIAGNOSIS — J020 Streptococcal pharyngitis: Secondary | ICD-10-CM

## 2016-09-26 DIAGNOSIS — I10 Essential (primary) hypertension: Secondary | ICD-10-CM | POA: Diagnosis not present

## 2016-09-26 DIAGNOSIS — E785 Hyperlipidemia, unspecified: Secondary | ICD-10-CM

## 2016-09-26 DIAGNOSIS — K219 Gastro-esophageal reflux disease without esophagitis: Secondary | ICD-10-CM

## 2016-09-26 LAB — POCT RAPID STREP A (OFFICE): RAPID STREP A SCREEN: POSITIVE — AB

## 2016-09-26 MED ORDER — CEFTRIAXONE SODIUM 500 MG IJ SOLR
500.0000 mg | Freq: Once | INTRAMUSCULAR | Status: AC
  Administered 2016-09-26: 500 mg via INTRAMUSCULAR

## 2016-09-26 MED ORDER — PENICILLIN V POTASSIUM 500 MG PO TABS: 500.0000 mg | ORAL_TABLET | Freq: Three times a day (TID) | ORAL | 0 refills | Status: DC

## 2016-09-26 NOTE — Patient Instructions (Addendum)
F./u as before, call if you need me sooner  Fasting lipid, cmp, Vit D and CBC 1 week before f/u visit  You are treated for frontal sinusitis and strep throat. Two tylenol tabs in office for headache, and rocephin 500 mg im administered Need to take 10 day course of penicillin sent to your pharmacy also Fluids and rest at home  Thank you  for choosing [facility]. We consider it a privelige to serve you.  Delivering excellent health care in a caring and  compassionate way is our goal.  Partnering with you,  so that together we can achieve this goal is our strategy.  Hope you feel better soon

## 2016-09-26 NOTE — Assessment & Plan Note (Signed)
Controlled, no change in medication DASH diet and commitment to daily physical activity for a minimum of 30 minutes discussed and encouraged, as a part of hypertension management. The importance of attaining a healthy weight is also discussed.  BP/Weight 09/26/2016 09/06/2016 08/26/2016 08/10/2016 08/08/2016 04/12/2016 02/25/2016  Systolic BP 138 142 130 130 133 122 128  Diastolic BP 84 86 81 84 84 82 78  Wt. (Lbs) 182 183 187 187 183.4 183 178  BMI 30.29 30.45 31.12 31.12 31.48 30.45 29.62

## 2016-09-29 ENCOUNTER — Ambulatory Visit (HOSPITAL_COMMUNITY)
Admission: RE | Admit: 2016-09-29 | Discharge: 2016-09-29 | Disposition: A | Discharge status: Home / Self Care | Payer: Medicare Other | Source: Ambulatory Visit | Attending: Family Medicine | Admitting: Family Medicine

## 2016-09-29 DIAGNOSIS — Z1231 Encounter for screening mammogram for malignant neoplasm of breast: Secondary | ICD-10-CM | POA: Diagnosis not present

## 2016-09-29 IMAGING — MG 2D DIGITAL SCREENING BILATERAL MAMMOGRAM WITH CAD AND ADJUNCT TO
8 series · 8 of 24 positions shown · non-contrast
Comparison: Previous exam(s).

CLINICAL DATA: Screening.

EXAM:
2D DIGITAL SCREENING BILATERAL MAMMOGRAM WITH CAD AND ADJUNCT TOMO

[L MLO]
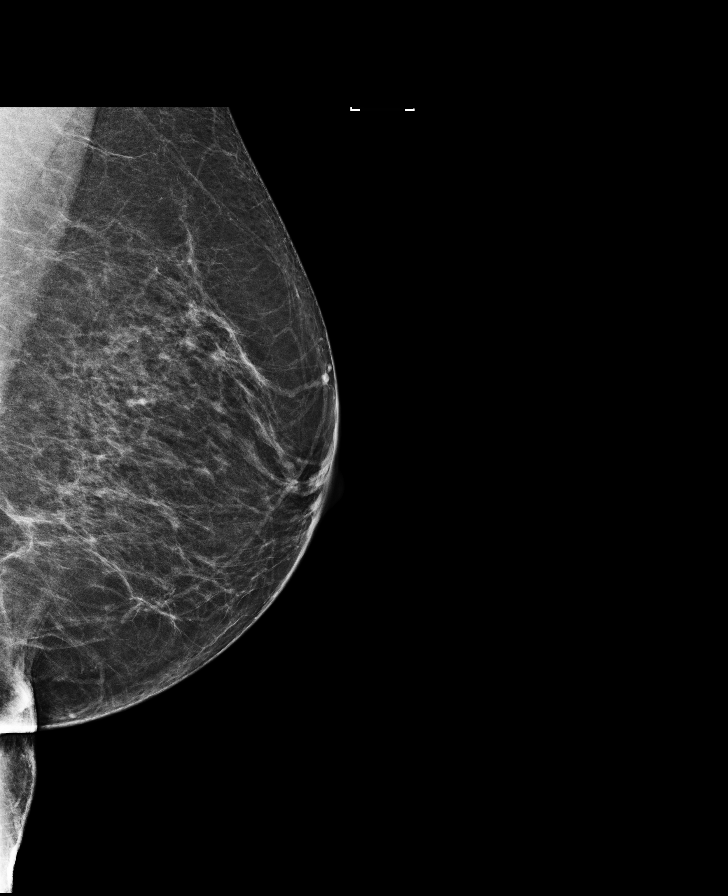

[R MLO]
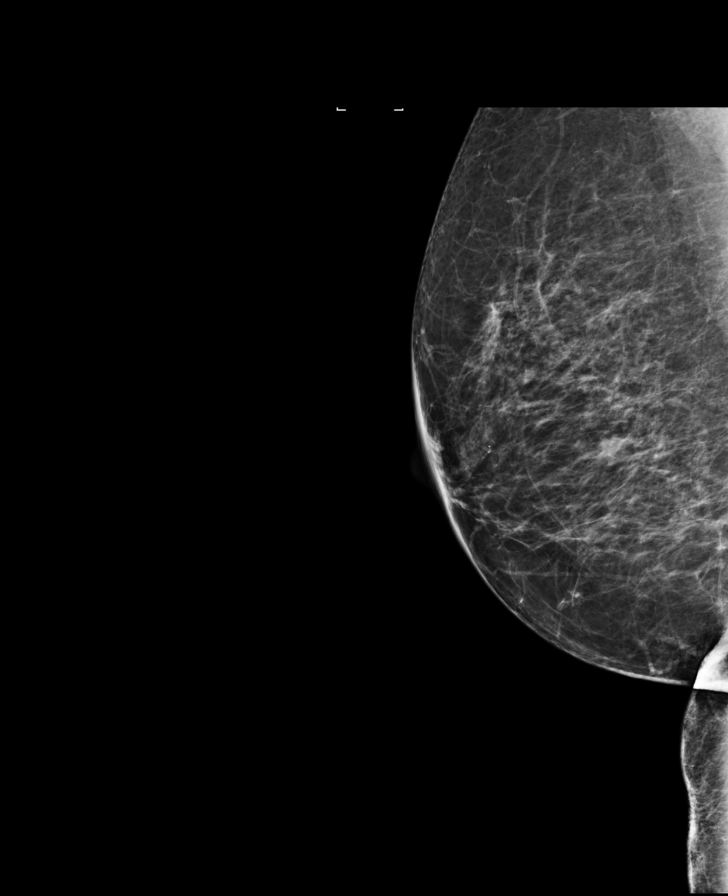

[L CC]
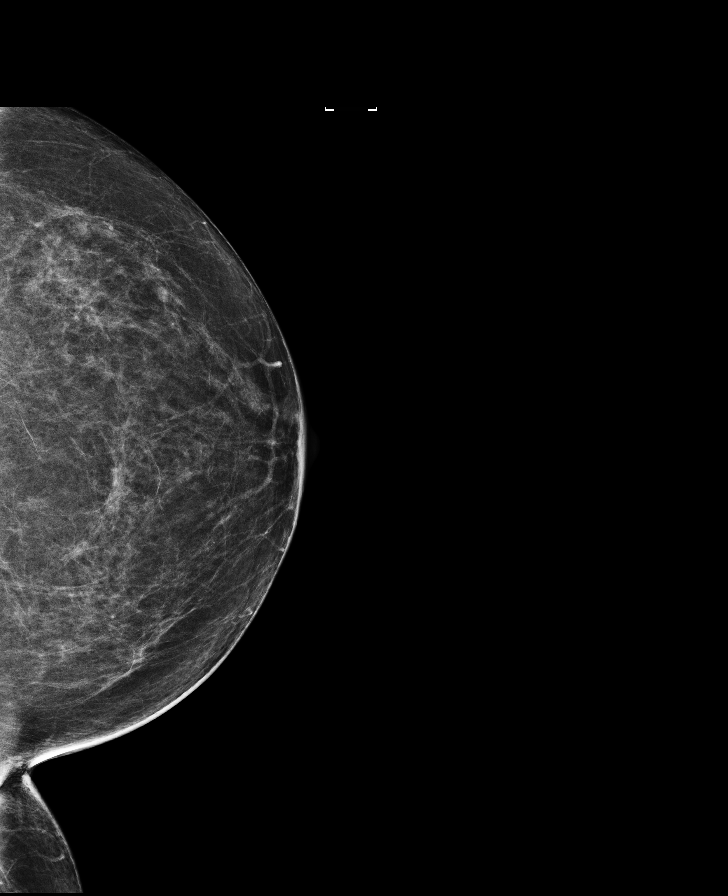

[R CC]
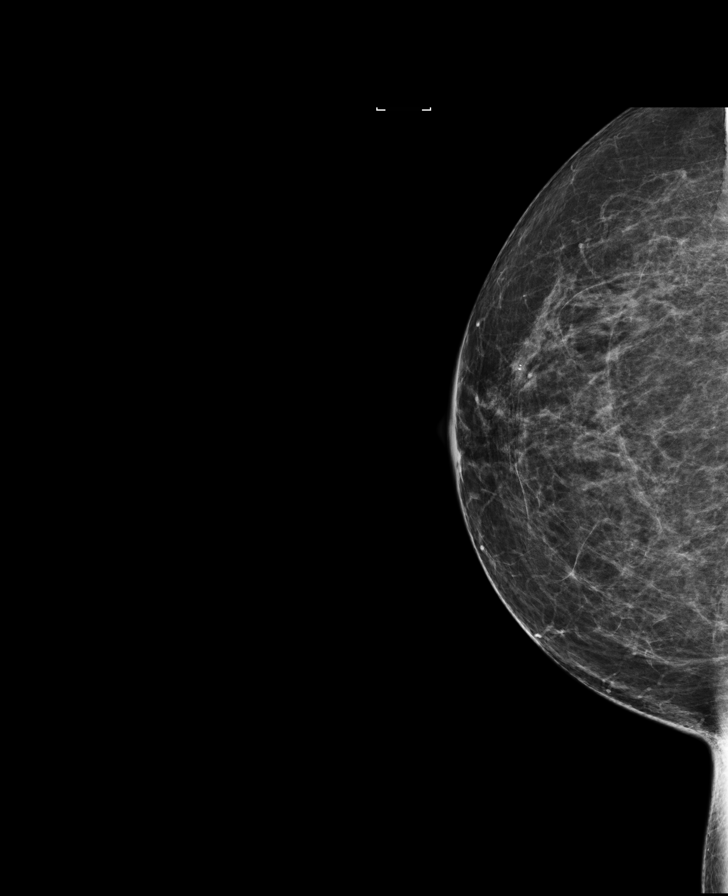

[R CC tomo · tomo slice 39/78.0]
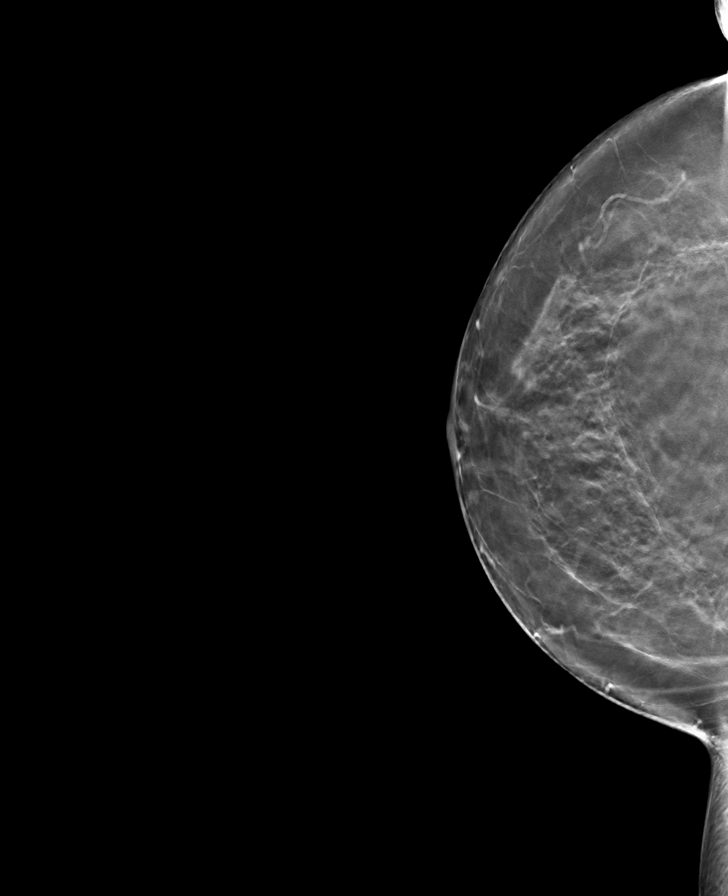

[R MLO tomo · tomo slice 39/78.0]
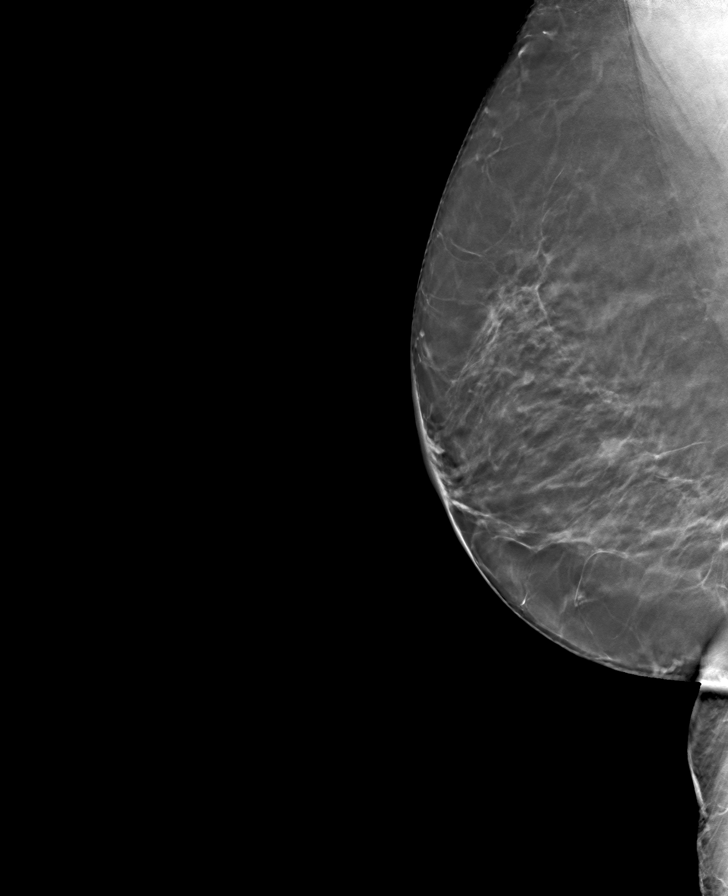

[L MLO tomo · tomo slice 41/80.0]
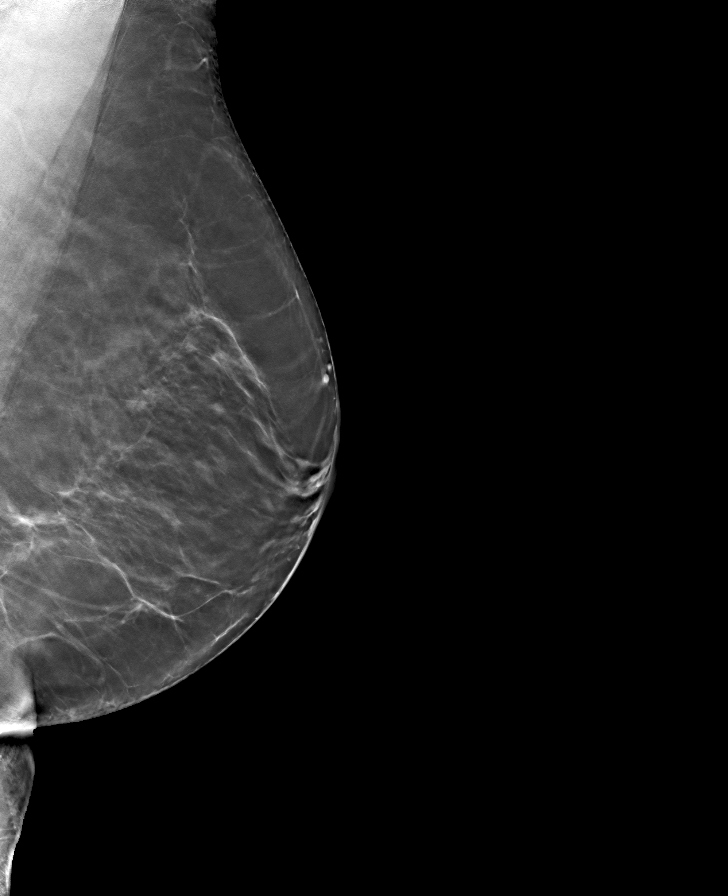

[L CC tomo · tomo slice 39/78.0]
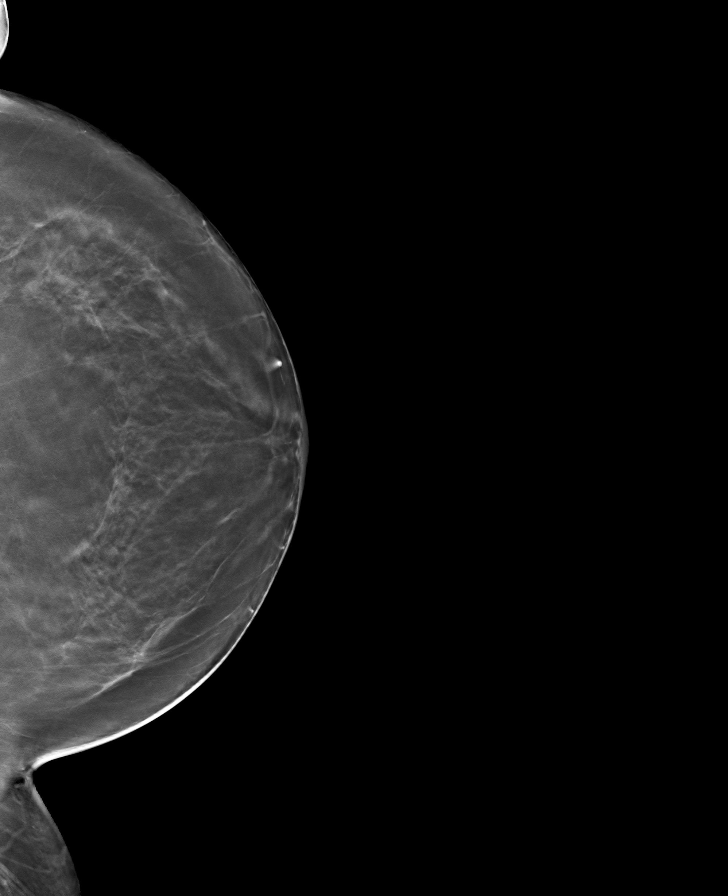

[8 of 24 positions shown; findings below may reference images not displayed]

ACR Breast Density Category b: There are scattered areas of
fibroglandular density.
FINDINGS: There are no findings suspicious for malignancy. Images were
processed with CAD.
IMPRESSION: No mammographic evidence of malignancy. A result letter of this
screening mammogram will be mailed directly to the patient.

RECOMMENDATION:
Screening mammogram in one year. (Code:[33])

BI-RADS CATEGORY  1: Negative.

## 2016-10-09 ENCOUNTER — Encounter: Payer: Self-pay | Admitting: Family Medicine

## 2016-10-09 NOTE — Assessment & Plan Note (Signed)
Hyperlipidemia:Low fat diet discussed and encouraged.   Lipid Panel  Lab Results  Component Value Date   CHOL 218 (H) 08/12/2016   HDL 61 08/12/2016   LDLCALC 133 (H) 08/12/2016   TRIG 120 08/12/2016   CHOLHDL 3.6 08/12/2016

## 2016-10-09 NOTE — Assessment & Plan Note (Addendum)
10 day course of penicillin prescribed 

## 2016-10-09 NOTE — Assessment & Plan Note (Addendum)
Acute strep infection, pen v  Prescribed and advised patient against close contacts for next 24 hrs, Rocephin administered at visit also

## 2016-10-09 NOTE — Progress Notes (Signed)
   [name]     MRN: [id number]      DOB: 11-Jan-1951   HPI [name] is here with a 5 day h/o scratchy throat , frontal pressure and sore throat, also  Chills with low grade fevr  ROS  Denies chest congestion, productive cough or wheezing. Denies chest pains, palpitations and leg swelling Denies abdominal pain, nausea, vomiting,diarrhea or constipation.   g. Denies depression, anxiety or insomnia. Denies skin break down or rash.   PE  BP 138/84   Pulse 93   Temp 98.3 F (36.8 C) (Oral)   Resp 16   Ht 5\' 5"  (1.651 m)   Wt 182 lb (82.6 kg)   SpO2 97%   BMI 30.29 kg/m   Patient alert and oriented and in no cardiopulmonary distress. Ill appearing HEENT: No facial asymmetry, EOMI,   Frontal sinus tenderness,oropharanterior cervical adenitis.  Chest: Clear to auscultation bilaterally.  CVS: S1, S2 no murmurs, no S3.Regular rate.  ABD: Soft non tender.   Ext: No edema  MS: Adequate ROM spine, shoulders, hips and knees.  Skin: Intact, no ulcerations or rash noted.  Psych: Good eye contact, normal affect. Memory intact not anxious or depressed appearing.  CNS: CN 2-12 intact, power,  normal throughout.no focal deficits noted.   Assessment & Plan Essential hypertension Controlled, no change in medication DASH diet and commitment to daily physical activity for a minimum of 30 minutes discussed and encouraged, as a part of hypertension management. The importance of attaining a healthy weight is also discussed.  BP/Weight 09/26/2016 09/06/2016 08/26/2016 08/10/2016 08/08/2016 04/12/2016 02/25/2016  Systolic BP 138 142 130 130 133 122 128  Diastolic BP 84 86 81 84 84 82 78  Wt. (Lbs) 182 183 187 187 183.4 183 178  BMI 30.29 30.45 31.12 31.12 31.48 30.45 29.62       Streptococcal sore throat Acute strep infection, pen v  Prescribed and advised patient against close contacts for next 24 hrs, Rocephin administered at visit also  Acute frontal sinusitis 10 day  course of penicillin prescribed  GERD Controlled, no change in medication   Hyperlipidemia LDL goal <100 Hyperlipidemia:Low fat diet discussed and encouraged.   Lipid Panel  Lab Results  Component Value Date   CHOL 218 (H) 08/12/2016   HDL 61 08/12/2016   LDLCALC 133 (H) 08/12/2016   TRIG 120 08/12/2016   CHOLHDL 3.6 08/12/2016

## 2016-10-09 NOTE — Assessment & Plan Note (Signed)
Controlled, no change in medication  

## 2016-10-23 ENCOUNTER — Other Ambulatory Visit: Payer: Self-pay | Admitting: Family Medicine

## 2016-10-26 DIAGNOSIS — G8929 Other chronic pain: Secondary | ICD-10-CM | POA: Diagnosis not present

## 2016-10-26 DIAGNOSIS — M25562 Pain in left knee: Secondary | ICD-10-CM | POA: Diagnosis not present

## 2016-11-01 ENCOUNTER — Ambulatory Visit (INDEPENDENT_AMBULATORY_CARE_PROVIDER_SITE_OTHER): Payer: Medicare Other | Admitting: Family Medicine

## 2016-11-01 ENCOUNTER — Encounter: Payer: Self-pay | Admitting: Family Medicine

## 2016-11-01 ENCOUNTER — Other Ambulatory Visit: Payer: Self-pay | Admitting: Family Medicine

## 2016-11-01 VITALS — BP 134/70 | HR 86 | Temp 97.6°F | Resp 18 | Ht 65.0 in | Wt 184.0 lb

## 2016-11-01 DIAGNOSIS — W19XXXA Unspecified fall, initial encounter: Secondary | ICD-10-CM | POA: Diagnosis not present

## 2016-11-01 DIAGNOSIS — Y92099 Unspecified place in other non-institutional residence as the place of occurrence of the external cause: Secondary | ICD-10-CM | POA: Diagnosis not present

## 2016-11-01 DIAGNOSIS — M545 Low back pain, unspecified: Secondary | ICD-10-CM

## 2016-11-01 DIAGNOSIS — Y92009 Unspecified place in unspecified non-institutional (private) residence as the place of occurrence of the external cause: Principal | ICD-10-CM

## 2016-11-01 DIAGNOSIS — S7001XA Contusion of right hip, initial encounter: Secondary | ICD-10-CM | POA: Diagnosis not present

## 2016-11-01 MED ORDER — HYDROCODONE-ACETAMINOPHEN 5-325 MG PO TABS: 1.0000 | ORAL_TABLET | Freq: Four times a day (QID) | ORAL | 0 refills | Status: DC | PRN

## 2016-11-01 MED ORDER — TIZANIDINE HCL 4 MG PO TABS: 4.0000 mg | ORAL_TABLET | Freq: Four times a day (QID) | ORAL | 0 refills | Status: DC | PRN

## 2016-11-01 MED ORDER — METHYLPREDNISOLONE 4 MG PO TBPK: ORAL_TABLET | ORAL | 0 refills | Status: DC

## 2016-11-01 MED ORDER — KETOROLAC TROMETHAMINE 30 MG/ML IJ SOLN
30.0000 mg | Freq: Once | INTRAMUSCULAR | Status: AC
  Administered 2016-11-01: 30 mg via INTRAMUSCULAR

## 2016-11-01 NOTE — Patient Instructions (Signed)
Home to rest Take the medrol pak as directed Take the tizanidine as needed muscle relaxer Take the hydrocodone as needed pain - caution drowsiness - caution constipation.  Call if not better by end of week

## 2016-11-01 NOTE — Progress Notes (Signed)
Chief Complaint  Patient presents with  . Back Pain    s/p fall   Patient is here for an acute visit. Her primary care doctor is Syliva Overman. She states the day before yesterday she fell in her home. She was in a hurry to get dressed, was putting on her trousers, and her feet got tangled up. She fell over on her right side and hit her right hip on furniture. At first her pain was mild and she kept going, she woke up yesterday in significant discomfort and spent the day resting. She is taking extra strength Tylenol, just not strong enough or pain. She is unable to take nonsteroidal drugs because of GI problems. She did not faint or get dizzy. She did not hit her head. The pain is in her right low back region, she points to her lumbar. No radiation into hips or legs and no numbness or weakness no bowel or bladder complaints. She is very uncomfortable today and didn't sleep last night. She is requesting an injection for pain.  Patient Active Problem List   Diagnosis Date Noted  . Constipation 02/05/2016  . Knee pain, left 09/08/2015  . Allergic rhinitis 10/08/2014  . GAD (generalized anxiety disorder) 09/23/2014  . POSTSURGICAL HYPOTHYROIDISM 06/22/2010  . Anemia 08/04/2009  . GERD 04/20/2009  . Hyperlipidemia LDL goal <100 05/16/2008  . Essential hypertension 05/16/2008  . OSTEOPENIA 05/16/2008  . Urinary incontinence 05/16/2008    Outpatient Encounter Prescriptions as of 11/01/2016  Medication Sig  . atorvastatin (LIPITOR) 20 MG tablet Take 1 tablet (20 mg total) by mouth daily.  . citalopram (CELEXA) 10 MG tablet Take 1 tablet (10 mg total) by mouth daily.  . ferrous sulfate 325 (65 FE) MG tablet Take 325 mg by mouth 2 (two) times daily with a meal.  . hydrochlorothiazide (HYDRODIURIL) 25 MG tablet TAKE 1 TABLET BY MOUTH EVERY DAY  . imipramine (TOFRANIL) 25 MG tablet TAKE 1 TABLET BY MOUTH EVERY NIGHT AT BEDTIME  . levothyroxine (SYNTHROID, LEVOTHROID) 125 MCG tablet Take 1  tablet (125 mcg total) by mouth daily before breakfast.  . linaclotide (LINZESS) 145 MCG CAPS capsule Take 1 capsule (145 mcg total) by mouth daily.  Marland Kitchen losartan (COZAAR) 25 MG tablet TAKE 1 TABLET(25 MG) BY MOUTH DAILY  . lovastatin (MEVACOR) 40 MG tablet TAKE 2 TABLETS BY MOUTH EVERY NIGHT AT BEDTIME FOR CHOLESTEROL  . Multiple Vitamin (MULTIVITAMIN) tablet Take 1 tablet by mouth daily.  . pantoprazole (PROTONIX) 40 MG tablet Take 1 tablet (40 mg total) by mouth 2 (two) times daily.  . penicillin v potassium (VEETID) 500 MG tablet Take 1 tablet (500 mg total) by mouth 3 (three) times daily.  . potassium chloride (K-DUR,KLOR-CON) 10 MEQ tablet TAKE 1 TABLET BY MOUTH TWICE DAILY  . vitamin E 400 UNIT capsule Take 400 Units by mouth daily.  Marland Kitchen HYDROcodone-acetaminophen (NORCO) 5-325 MG tablet Take 1 tablet by mouth every 6 (six) hours as needed for moderate pain.  . methylPREDNISolone (MEDROL DOSEPAK) 4 MG TBPK tablet Tad  . tiZANidine (ZANAFLEX) 4 MG tablet Take 1 tablet (4 mg total) by mouth every 6 (six) hours as needed for muscle spasms.  . [EXPIRED] ketorolac (TORADOL) 30 MG/ML injection 30 mg    No facility-administered encounter medications on file as of 11/01/2016.     Allergies  Allergen Reactions  . Dilaudid [Hydromorphone Hcl]   . Levaquin [Levofloxacin] Other (See Comments)    Dry throat, no raste  . Nsaids  REACTION: per GI pt no longer to use NSAIDS    Review of Systems  Constitutional: Negative for activity change, appetite change and fever.  HENT: Negative for congestion and dental problem.   Eyes: Negative for photophobia and visual disturbance.  Respiratory: Negative for cough and shortness of breath.   Cardiovascular: Negative for chest pain, palpitations and leg swelling.  Gastrointestinal: Negative for constipation and diarrhea.  Genitourinary: Negative for difficulty urinating and frequency.  Musculoskeletal: Positive for back pain and gait problem.    Neurological: Negative for dizziness and headaches.  Psychiatric/Behavioral: Positive for sleep disturbance. Negative for dysphoric mood. The patient is not nervous/anxious.     BP 134/70 (BP Location: Right Arm, Patient Position: Sitting, Cuff Size: Normal)   Pulse 86   Temp 97.6 F (36.4 C) (Oral)   Resp 18   Ht 5\' 5"  (1.651 m)   Wt 184 lb (83.5 kg)   SpO2 97%   BMI 30.62 kg/m   Physical Exam  Constitutional: She is oriented to person, place, and time. She appears well-developed and well-nourished.  Patient is uncomfortable. Antalgic gait.  HENT:  Head: Normocephalic and atraumatic.  Mouth/Throat: Oropharynx is clear and moist.  Eyes: Conjunctivae are normal. Pupils are equal, round, and reactive to light.  Neck: Normal range of motion. Neck supple.  Cardiovascular: Normal rate, regular rhythm and normal heart sounds.   Pulmonary/Chest: Effort normal and breath sounds normal. She has no wheezes.  Musculoskeletal: Normal range of motion. She exhibits no edema.       Lumbar back: She exhibits tenderness and spasm.       Back:  Lymphadenopathy:    She has no cervical adenopathy.  Neurological: She is alert and oriented to person, place, and time. She displays normal reflexes.  Psychiatric: She has a normal mood and affect. Her behavior is normal. Thought content normal.    ASSESSMENT/PLAN:  1. Fall in home, initial encounter  - ketorolac (TORADOL) 30 MG/ML injection 30 mg; Inject 1 mL (30 mg total) into the muscle once.  2. Contusion of right hip, initial encounter   3. Acute right-sided low back pain without sciatica  - ketorolac (TORADOL) 30 MG/ML injection 30 mg; Inject 1 mL (30 mg total) into the muscle once.   Patient Instructions  Home to rest Take the medrol pak as directed Take the tizanidine as needed muscle relaxer Take the hydrocodone as needed pain - caution drowsiness - caution constipation.  Call if not better by end of week   Eustace MooreYvonne Sue  Sherrill Mckamie, MD

## 2016-11-02 ENCOUNTER — Telehealth: Payer: Self-pay | Admitting: Family Medicine

## 2016-11-02 NOTE — Telephone Encounter (Signed)
I called [facility] and her rx is there, left message for pt.

## 2016-11-02 NOTE — Telephone Encounter (Signed)
Laura HongJudy called and states that she still hasnt gotten her medication where she saw Dr. Delton SeeNelson yesterday, Please advise?

## 2016-11-04 ENCOUNTER — Other Ambulatory Visit: Payer: Self-pay | Admitting: "Endocrinology

## 2016-11-04 MED ORDER — LEVOTHYROXINE SODIUM 125 MCG PO TABS: 125.0000 ug | ORAL_TABLET | Freq: Every day | ORAL | 0 refills | Status: DC

## 2016-11-12 ENCOUNTER — Other Ambulatory Visit: Payer: Self-pay | Admitting: Family Medicine

## 2016-12-20 ENCOUNTER — Ambulatory Visit: Payer: Medicare Other | Admitting: Family Medicine

## 2016-12-20 ENCOUNTER — Other Ambulatory Visit: Payer: Self-pay | Admitting: Family Medicine

## 2016-12-20 DIAGNOSIS — E559 Vitamin D deficiency, unspecified: Secondary | ICD-10-CM | POA: Diagnosis not present

## 2016-12-20 DIAGNOSIS — I10 Essential (primary) hypertension: Secondary | ICD-10-CM | POA: Diagnosis not present

## 2016-12-20 DIAGNOSIS — E785 Hyperlipidemia, unspecified: Secondary | ICD-10-CM | POA: Diagnosis not present

## 2016-12-21 LAB — COMPREHENSIVE METABOLIC PANEL
A/G RATIO: 1.2 (ref 1.2–2.2)
ALT: 16 IU/L (ref 0–32)
AST: 19 IU/L (ref 0–40)
Albumin: 3.8 g/dL (ref 3.6–4.8)
Alkaline Phosphatase: 98 IU/L (ref 39–117)
BILIRUBIN TOTAL: 0.3 mg/dL (ref 0.0–1.2)
BUN/Creatinine Ratio: 13 (ref 12–28)
BUN: 11 mg/dL (ref 8–27)
CHLORIDE: 97 mmol/L (ref 96–106)
CO2: 26 mmol/L (ref 18–29)
Calcium: 9.4 mg/dL (ref 8.7–10.3)
Creatinine, Ser: 0.85 mg/dL (ref 0.57–1.00)
GFR calc Af Amer: 83 (ref >59)
GFR calc non Af Amer: 72 (ref >59)
Globulin, Total: 3.2 (ref 1.5–4.5)
Glucose: 94 mg/dL (ref 65–99)
POTASSIUM: 4.1 mmol/L (ref 3.5–5.2)
Sodium: 140 mmol/L (ref 134–144)
Total Protein: 7 g/dL (ref 6.0–8.5)

## 2016-12-21 LAB — CBC/DIFF AMBIGUOUS DEFAULT
BASOS: 1 %
Basophils Absolute: 0 10*3/uL (ref 0.0–0.2)
EOS (ABSOLUTE): 0.1 10*3/uL (ref 0.0–0.4)
Eos: 2 %
Hematocrit: 34 % (ref 34.0–46.6)
Hemoglobin: 10.5 g/dL — ABNORMAL LOW (ref 11.1–15.9)
Immature Grans (Abs): 0 10*3/uL (ref 0.0–0.1)
Immature Granulocytes: 0 %
LYMPHS ABS: 1.9 10*3/uL (ref 0.7–3.1)
Lymphs: 44 %
MCH: 26.9 pg (ref 26.6–33.0)
MCHC: 30.9 g/dL — AB (ref 31.5–35.7)
MCV: 87 fL (ref 79–97)
MONOS ABS: 0.4 10*3/uL (ref 0.1–0.9)
Monocytes: 8 %
NEUTROS ABS: 2 10*3/uL (ref 1.4–7.0)
Neutrophils: 45 %
PLATELETS: 330 10*3/uL (ref 150–379)
RBC: 3.91 x10E6/uL (ref 3.77–5.28)
RDW: 13.1 % (ref 12.3–15.4)
WBC: 4.3 10*3/uL (ref 3.4–10.8)

## 2016-12-21 LAB — LIPID PANEL W/O CHOL/HDL RATIO
Cholesterol, Total: 195 mg/dL (ref 100–199)
HDL: 54 mg/dL (ref >39)
LDL Calculated: 117 — ABNORMAL HIGH (ref 0–99)
Triglycerides: 118 mg/dL (ref 0–149)
VLDL Cholesterol Cal: 24 (ref 5–40)

## 2016-12-21 LAB — AMBIG ABBREV LP DEFAULT

## 2016-12-21 LAB — AMBIG ABBREV CMP14 DEFAULT

## 2016-12-21 LAB — VITAMIN D 25 HYDROXY (VIT D DEFICIENCY, FRACTURES): Vit D, 25-Hydroxy: 30.3 ng/mL (ref 30.0–100.0)

## 2016-12-22 ENCOUNTER — Other Ambulatory Visit: Payer: Self-pay | Admitting: Family Medicine

## 2016-12-25 ENCOUNTER — Other Ambulatory Visit: Payer: Self-pay | Admitting: Family Medicine

## 2016-12-27 ENCOUNTER — Telehealth: Payer: Self-pay

## 2016-12-27 DIAGNOSIS — M1711 Unilateral primary osteoarthritis, right knee: Secondary | ICD-10-CM | POA: Diagnosis not present

## 2016-12-27 DIAGNOSIS — M1712 Unilateral primary osteoarthritis, left knee: Secondary | ICD-10-CM | POA: Diagnosis not present

## 2016-12-27 NOTE — Telephone Encounter (Signed)
Advise leg elevation and reduce salt intake, kidney and heart function are excellent

## 2016-12-27 NOTE — Telephone Encounter (Signed)
Patient aware.

## 2017-01-04 ENCOUNTER — Encounter: Payer: Self-pay | Admitting: Family Medicine

## 2017-01-04 ENCOUNTER — Ambulatory Visit (INDEPENDENT_AMBULATORY_CARE_PROVIDER_SITE_OTHER): Payer: Medicare Other | Admitting: Family Medicine

## 2017-01-04 VITALS — BP 144/92 | HR 82 | Ht 65.0 in | Wt 179.0 lb

## 2017-01-04 DIAGNOSIS — J302 Other seasonal allergic rhinitis: Secondary | ICD-10-CM | POA: Diagnosis not present

## 2017-01-04 DIAGNOSIS — E785 Hyperlipidemia, unspecified: Secondary | ICD-10-CM

## 2017-01-04 DIAGNOSIS — G4489 Other headache syndrome: Secondary | ICD-10-CM | POA: Diagnosis not present

## 2017-01-04 DIAGNOSIS — F411 Generalized anxiety disorder: Secondary | ICD-10-CM

## 2017-01-04 DIAGNOSIS — E890 Postprocedural hypothyroidism: Secondary | ICD-10-CM | POA: Diagnosis not present

## 2017-01-04 DIAGNOSIS — I10 Essential (primary) hypertension: Secondary | ICD-10-CM

## 2017-01-04 DIAGNOSIS — N39498 Other specified urinary incontinence: Secondary | ICD-10-CM | POA: Diagnosis not present

## 2017-01-04 MED ORDER — OXYBUTYNIN CHLORIDE ER 5 MG PO TB24: 5.0000 mg | ORAL_TABLET | Freq: Every day | ORAL | 5 refills | Status: DC

## 2017-01-04 MED ORDER — METHYLPREDNISOLONE ACETATE 80 MG/ML IJ SUSP
80.0000 mg | Freq: Once | INTRAMUSCULAR | Status: AC
  Administered 2017-01-04: 80 mg via INTRAMUSCULAR

## 2017-01-04 MED ORDER — LORATADINE 10 MG PO TABS: 10.0000 mg | ORAL_TABLET | Freq: Every day | ORAL | 3 refills | Status: DC

## 2017-01-04 MED ORDER — PREDNISONE 5 MG PO TABS: 5.0000 mg | ORAL_TABLET | Freq: Two times a day (BID) | ORAL | 0 refills | Status: AC

## 2017-01-04 NOTE — Assessment & Plan Note (Addendum)
uncontrolled , increase losartan dose to 50 mg , re eval in  6 weeks DASH diet and commitment to daily physical activity for a minimum of 30 minutes discussed and encouraged, as a part of hypertension management. The importance of attaining a healthy weight is also discussed.  BP/Weight 01/04/2017 11/01/2016 09/26/2016 09/06/2016 08/26/2016 08/10/2016 08/08/2016  Systolic BP 144 134 138 142 130 130 133  Diastolic BP 92 70 84 86 81 84 84  Wt. (Lbs) 179.04 184 182 183 187 187 183.4  BMI 29.79 30.62 30.29 30.45 31.12 31.12 31.48

## 2017-01-04 NOTE — Assessment & Plan Note (Signed)
Uncontrolled, start oxybutynin

## 2017-01-04 NOTE — Patient Instructions (Addendum)
Nurse bP check in 2 weeks  MD follow up in 6 to 8 weeks  STOP tylenol sinus this is running up your blood pressure  Two tylenol tablets today for headache from allergies/ sinus  Steroid injection followed by 5 days of prednisone and daily claritin/ loratidine foir allergies.  Medication sent for incontinence  Labs excellent except need to reduce fried and fatty foods

## 2017-01-04 NOTE — Assessment & Plan Note (Addendum)
Sart daily loratidine, depo medrol iM in office followed by short course of prednisone, currently uncontroled

## 2017-01-06 ENCOUNTER — Encounter: Payer: Self-pay | Admitting: Family Medicine

## 2017-01-06 ENCOUNTER — Telehealth: Payer: Self-pay | Admitting: Family Medicine

## 2017-01-06 DIAGNOSIS — R51 Headache: Secondary | ICD-10-CM

## 2017-01-06 DIAGNOSIS — R519 Headache, unspecified: Secondary | ICD-10-CM | POA: Insufficient documentation

## 2017-01-06 NOTE — Progress Notes (Signed)
   Laura EstimableJudy C [name]     MRN: [id number]      DOB: 02-May-1951   HPI Laura [name] is here for follow up and re-evaluation of chronic medical conditions, medication management and review of any available recent lab and radiology data.  Preventive health is updated, specifically  Cancer screening and Immunization.   Questions or concerns regarding consultations or procedures which the PT has had in the interim are  addressed. The PT denies any adverse reactions to current medications since the last visit.  C/o headache, increased allergy symptoms and  Right knee swelling   ROS Denies recent fever or chills. Denies , ear pain or sore throat. Denies chest congestion, productive cough or wheezing. Denies chest pains, palpitations and leg swelling Denies abdominal pain, nausea, vomiting,diarrhea or constipation.   Denies dysuria, frequency, hesitancy or incontinence. Denies limitation in mobility. Denies , seizures, numbness, or tingling. Denies depression, anxiety or insomnia. Denies skin break down or rash.   PE  BP (!) 144/92   Pulse 82   Ht 5\' 5"  (1.651 m)   Wt 179 lb 0.6 oz (81.2 kg)   SpO2 99%   BMI 29.79 kg/m   Patient alert and oriented and in no cardiopulmonary distress.  HEENT: No facial asymmetry, EOMI,   oropharynx pink and moist.  Neck supple no JVD, no mass.TM clear, nasal mucosa erythematous and edematous,frontal  Sinus tenderness  Chest: Clear to auscultation bilaterally.  CVS: S1, S2 no murmurs, no S3.Regular rate.  ABD: Soft non tender.   Ext: No edema  MS: Adequate ROM spine, shoulders, hips and reduced in right  Knee which is swollen .  Skin: Intact, no ulcerations or rash noted.  Psych: Good eye contact, normal affect. Memory intact not anxious or depressed appearing.  CNS: CN 2-12 intact, power,  normal throughout.no focal deficits noted.   Assessment & Plan  Essential hypertension uncontrolled , increase losartan dose to 50 mg , re eval in  6  weeks DASH diet and commitment to daily physical activity for a minimum of 30 minutes discussed and encouraged, as a part of hypertension management. The importance of attaining a healthy weight is also discussed.  BP/Weight 01/04/2017 11/01/2016 09/26/2016 09/06/2016 08/26/2016 08/10/2016 08/08/2016  Systolic BP 144 134 138 142 130 130 133  Diastolic BP 92 70 84 86 81 84 84  Wt. (Lbs) 179.04 184 182 183 187 187 183.4  BMI 29.79 30.62 30.29 30.45 31.12 31.12 31.48       Urinary incontinence Uncontrolled, start oxybutynin  Allergic rhinitis Sart daily loratidine, depo medrol iM in office followed by short course of prednisone, currently uncontroled  Hyperlipidemia LDL goal <100 Hyperlipidemia:Low fat diet discussed and encouraged.   Lipid Panel  Lab Results  Component Value Date   CHOL 195 12/20/2016   HDL 54 12/20/2016   LDLCALC 117 (H) 12/20/2016   TRIG 118 12/20/2016   CHOLHDL 3.6 08/12/2016   Needs to lower fat intake, due to inc LDL    GAD (generalized anxiety disorder) Controlled, no change in medication   POSTSURGICAL HYPOTHYROIDISM Followed by endo and controlled   Headache Frontal headache from uncontrolled allergy normal neuro exam, tylenol 2 tabs given at visit

## 2017-01-06 NOTE — Assessment & Plan Note (Signed)
Frontal headache from uncontrolled allergy normal neuro exam, tylenol 2 tabs given at visit

## 2017-01-06 NOTE — Assessment & Plan Note (Signed)
Controlled, no change in medication  

## 2017-01-06 NOTE — Assessment & Plan Note (Signed)
Hyperlipidemia:Low fat diet discussed and encouraged.   Lipid Panel  Lab Results  Component Value Date   CHOL 195 12/20/2016   HDL 54 12/20/2016   LDLCALC 117 (H) 12/20/2016   TRIG 118 12/20/2016   CHOLHDL 3.6 08/12/2016   Needs to lower fat intake, due to inc LDL

## 2017-01-06 NOTE — Telephone Encounter (Signed)
Darel HongJudy called and stated that the medication Dr. Lodema HongSimpson wrote her oxybutynin (DITROPAN-XL) 5 MG 24 hr tablet  Costs over $100.00 and she didn't get it , please advise?

## 2017-01-06 NOTE — Assessment & Plan Note (Signed)
Followed by endo and controlled 

## 2017-01-10 ENCOUNTER — Other Ambulatory Visit: Payer: Self-pay | Admitting: Family Medicine

## 2017-01-10 ENCOUNTER — Telehealth: Payer: Self-pay

## 2017-01-10 ENCOUNTER — Other Ambulatory Visit: Payer: Self-pay

## 2017-01-10 MED ORDER — OXYBUTYNIN CHLORIDE 5 MG PO TABS: 5.0000 mg | ORAL_TABLET | Freq: Two times a day (BID) | ORAL | 3 refills | Status: DC

## 2017-01-10 NOTE — Telephone Encounter (Signed)
Called and spoke to pts pharmacy, they state she has a deductible to meet on her meds , and she is going to have a co pay no matter what med we rx; left message for pt to call office.

## 2017-01-10 NOTE — Telephone Encounter (Signed)
I will enter oxybutynin, pls check with pharmacy if not covered and ask them to send ;ist of alternatives, thanks

## 2017-01-10 NOTE — Telephone Encounter (Signed)
Pt called stating the ditropan you ordered is too expensive,  Wanted to know if you can order something cheaper?

## 2017-01-18 ENCOUNTER — Encounter: Payer: Self-pay | Admitting: Family Medicine

## 2017-01-18 ENCOUNTER — Ambulatory Visit (INDEPENDENT_AMBULATORY_CARE_PROVIDER_SITE_OTHER): Payer: Medicare Other | Admitting: Family Medicine

## 2017-01-18 VITALS — BP 148/82 | HR 84 | Resp 15 | Ht 65.0 in | Wt 183.0 lb

## 2017-01-18 DIAGNOSIS — N644 Mastodynia: Secondary | ICD-10-CM | POA: Diagnosis not present

## 2017-01-18 DIAGNOSIS — F411 Generalized anxiety disorder: Secondary | ICD-10-CM | POA: Diagnosis not present

## 2017-01-18 DIAGNOSIS — I10 Essential (primary) hypertension: Secondary | ICD-10-CM | POA: Diagnosis not present

## 2017-01-18 DIAGNOSIS — J301 Allergic rhinitis due to pollen: Secondary | ICD-10-CM | POA: Diagnosis not present

## 2017-01-18 DIAGNOSIS — N3945 Continuous leakage: Secondary | ICD-10-CM

## 2017-01-18 MED ORDER — LOSARTAN POTASSIUM 50 MG PO TABS: 50.0000 mg | ORAL_TABLET | Freq: Every day | ORAL | 3 refills | Status: DC

## 2017-01-18 MED ORDER — MIRABEGRON ER 25 MG PO TB24: 25.0000 mg | ORAL_TABLET | Freq: Every day | ORAL | 3 refills | Status: DC

## 2017-01-18 NOTE — Assessment & Plan Note (Signed)
approx 6 month h/o localizaed left breast pain and nodule at 4 o clock position, needs imaging

## 2017-01-18 NOTE — Patient Instructions (Signed)
f/u as before, call if you need me sooner.  Blood pressure is still too high, increase cozaar/ losartan  to 50 mg daily, OK to take TWO 25 mg  Tablets daily till done  We will get info from pharmacy as to most affordable med for your incontinence.No $4 option available  You are referred for diagnostic mammogram

## 2017-01-18 NOTE — Patient Instructions (Signed)
f/u as before,

## 2017-01-21 ENCOUNTER — Encounter: Payer: Self-pay | Admitting: Family Medicine

## 2017-01-21 NOTE — Assessment & Plan Note (Signed)
Controlled, no change in medication  

## 2017-01-21 NOTE — Assessment & Plan Note (Signed)
Uncontrolled, increase med dose, f/u in 6 weeks DASH diet and commitment to daily physical activity for a minimum of 30 minutes discussed and encouraged, as a part of hypertension management. The importance of attaining a healthy weight is also discussed.  BP/Weight 01/18/2017 01/04/2017 11/01/2016 09/26/2016 09/06/2016 08/26/2016 08/10/2016  Systolic BP 148 144 134 138 142 130 130  Diastolic BP 82 92 70 84 86 81 84  Wt. (Lbs) 183 179.04 184 182 183 187 187  BMI 30.45 29.79 30.62 30.29 30.45 31.12 31.12

## 2017-01-21 NOTE — Progress Notes (Signed)
   Skip EstimableJudy C Cahn     MRN: [id number]      DOB: 03/12/1951   HPI [name] is here initially for nurse BP re evaluation, however, she told nurse she had a painful left breast lump for over 6 months so she is being seen Denies breast trauma, has had mammogram during this time, states feels pain down  Her left inner arm and hand, has had right carpal tunnel surgery  Denies  nipple d/c or inversion,  No f/h or personal h/o breast cancer  ROS See HPI  Denies recent fever or chills. Denies sinus pressure, nasal congestion, ear pain or sore throat. Denies chest congestion, productive cough or wheezing. Denies chest pains, palpitations and leg swelling Denies abdominal pain, nausea, vomiting,diarrhea or constipation.   c/o urinary incontinence, needs affordable med Denies joint pain, swelling and limitation in mobility. Denies headaches, seizures, numbness, or tingling. Denies depression, anxiety or insomnia. Denies skin break down or rash.   PE  BP (!) 148/82   Pulse 84   Resp 15   Ht 5\' 5"  (1.651 m)   Wt 183 lb (83 kg)   SpO2 94%   BMI 30.45 kg/m   Patient alert and oriented and in no cardiopulmonary distress.  HEENT: No facial asymmetry, EOMI,   oropharynx pink and moist.  Neck supple no JVD, no mass.  Chest: Clear to auscultation bilaterally. Breast: right no mass or localized tenderness, no palpable axillary or supraclavicular nodes, no nipple  D/c or inversion Left breast: tender area at 4 o clock, possible nodule, no adenopathy, nipple d/c or inversion CVS: S1, S2 no murmurs, no S3.Regular rate.  ABD: Soft non tender.   Ext: No edema  MS: Adequate ROM spine, shoulders, hips and knees.  Skin: Intact, no ulcerations or rash noted.  Psych: Good eye contact, normal affect. Memory intact not anxious or depressed appearing.  CNS: CN 2-12 intact, power,  normal throughout.no focal deficits noted.   Assessment & Plan  Breast pain, left approx 6 month h/o localizaed  left breast pain and nodule at 4 o clock position, needs imaging  Essential hypertension Uncontrolled, increase med dose, f/u in 6 weeks DASH diet and commitment to daily physical activity for a minimum of 30 minutes discussed and encouraged, as a part of hypertension management. The importance of attaining a healthy weight is also discussed.  BP/Weight 01/18/2017 01/04/2017 11/01/2016 09/26/2016 09/06/2016 08/26/2016 08/10/2016  Systolic BP 148 144 134 138 142 130 130  Diastolic BP 82 92 70 84 86 81 84  Wt. (Lbs) 183 179.04 184 182 183 187 187  BMI 30.45 29.79 30.62 30.29 30.45 31.12 31.12       GAD (generalized anxiety disorder) Controlled, no change in medication   Allergic rhinitis Controlled, no change in medication

## 2017-01-21 NOTE — Assessment & Plan Note (Signed)
Uncontrolled, needs medication to manage her symptoms, will see affordable option

## 2017-01-21 NOTE — Progress Notes (Signed)
   Skip EstimableJudy C Tomlinson     MRN: [id number]      DOB: 12-Sep-1951    Changed from clinical support to MD visit

## 2017-01-23 ENCOUNTER — Other Ambulatory Visit: Payer: Self-pay | Admitting: Family Medicine

## 2017-01-23 ENCOUNTER — Telehealth: Payer: Self-pay

## 2017-01-23 NOTE — Telephone Encounter (Signed)
Change entered to [facility] historically, pls fax and notify pt and pharmacy

## 2017-01-23 NOTE — Telephone Encounter (Signed)
Although her formulary said to try myrbetriq it is still too expensive for patient. Wants to know if something else can be sent. Maybe [facility]. Please advise

## 2017-01-24 ENCOUNTER — Other Ambulatory Visit: Payer: Self-pay

## 2017-01-24 MED ORDER — SOLIFENACIN SUCCINATE 5 MG PO TABS: 5.0000 mg | ORAL_TABLET | Freq: Every day | ORAL | 3 refills | Status: DC

## 2017-01-24 NOTE — Telephone Encounter (Signed)
Med sent.

## 2017-01-31 ENCOUNTER — Other Ambulatory Visit: Payer: Self-pay | Admitting: "Endocrinology

## 2017-02-09 ENCOUNTER — Other Ambulatory Visit: Payer: Self-pay | Admitting: Family Medicine

## 2017-02-09 DIAGNOSIS — M170 Bilateral primary osteoarthritis of knee: Secondary | ICD-10-CM | POA: Diagnosis not present

## 2017-02-13 ENCOUNTER — Telehealth: Payer: Self-pay

## 2017-02-13 ENCOUNTER — Telehealth: Payer: Self-pay | Admitting: Family Medicine

## 2017-02-13 ENCOUNTER — Telehealth: Payer: Self-pay | Admitting: Gastroenterology

## 2017-02-13 NOTE — Telephone Encounter (Signed)
REVIEWED. AGREE. NO ADDITIONAL RECOMMENDATIONS. 
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
## 2017-02-13 NOTE — Telephone Encounter (Signed)
Agree with ED evaluation. 
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
## 2017-02-13 NOTE — Telephone Encounter (Signed)
Patient walked in with complaits of "burning in her belly" and a tingling sensation along with numbness in her L arm. Laura [name] spoke with patient and advised.
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
## 2017-02-14 ENCOUNTER — Ambulatory Visit (INDEPENDENT_AMBULATORY_CARE_PROVIDER_SITE_OTHER): Payer: Medicare Other | Admitting: Family Medicine

## 2017-02-14 ENCOUNTER — Other Ambulatory Visit: Payer: Self-pay | Admitting: Family Medicine

## 2017-02-14 ENCOUNTER — Encounter (INDEPENDENT_AMBULATORY_CARE_PROVIDER_SITE_OTHER): Payer: Self-pay

## 2017-02-14 ENCOUNTER — Encounter: Payer: Self-pay | Admitting: Family Medicine

## 2017-02-14 VITALS — BP 140/84 | HR 85 | Resp 16 | Ht 65.0 in | Wt 185.0 lb

## 2017-02-14 DIAGNOSIS — R1013 Epigastric pain: Secondary | ICD-10-CM

## 2017-02-14 DIAGNOSIS — R630 Anorexia: Secondary | ICD-10-CM

## 2017-02-14 NOTE — Patient Instructions (Signed)
increase the Protonix to 2 times a day morning and night May take antacid like TUMS if needed [place]  I am ordering lab tests  I will send you a letter with your test results.  If there is anything of concern, we will call right away.   Call if worse at any time or if not improved by Friday
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
## 2017-02-15 LAB — COMPLETE METABOLIC PANEL WITH GFR
ALBUMIN: 3.9 g/dL (ref 3.6–5.1)
ALK PHOS: 71 U/L (ref 33–130)
ALT: 15 U/L (ref 6–29)
AST: 19 U/L (ref 10–35)
BILIRUBIN TOTAL: 0.4 mg/dL (ref 0.2–1.2)
BUN: 10 mg/dL (ref 7–25)
CALCIUM: 9.8 mg/dL (ref 8.6–10.4)
CO2: 27 mmol/L (ref 20–31)
Chloride: 101 mmol/L (ref 98–110)
Creat: 0.92 mg/dL (ref 0.50–0.99)
GFR, Est African American: 75 mL/min (ref >=60)
GFR, Est Non African American: 65 mL/min (ref >=60)
Glucose, Bld: 91 mg/dL (ref 65–99)
Potassium: 3.5 mmol/L (ref 3.5–5.3)
Sodium: 139 mmol/L (ref 135–146)
TOTAL PROTEIN: 6.9 g/dL (ref 6.1–8.1)

## 2017-02-15 LAB — URINALYSIS, ROUTINE W REFLEX MICROSCOPIC
BILIRUBIN URINE: NEGATIVE
GLUCOSE, UA: NEGATIVE
Hgb urine dipstick: NEGATIVE
Ketones, ur: NEGATIVE
Nitrite: NEGATIVE
PROTEIN: NEGATIVE
Specific Gravity, Urine: 1.014 (ref 1.001–1.035)
pH: 7 (ref 5.0–8.0)

## 2017-02-15 LAB — CBC WITH DIFFERENTIAL/PLATELET
Basophils Absolute: 0 cells/uL (ref 0–200)
Basophils Relative: 0 %
Eosinophils Absolute: 50 cells/uL (ref 15–500)
Eosinophils Relative: 1 %
HCT: 34.5 % — ABNORMAL LOW (ref 35.0–45.0)
HEMOGLOBIN: 10.8 g/dL — AB (ref 11.7–15.5)
LYMPHS ABS: 2000 {cells}/uL (ref 850–3900)
Lymphocytes Relative: 40 %
MCH: 27.1 pg (ref 27.0–33.0)
MCHC: 31.3 g/dL — AB (ref 32.0–36.0)
MCV: 86.5 fL (ref 80.0–100.0)
MONO ABS: 350 {cells}/uL (ref 200–950)
MPV: 9.3 fL (ref 7.5–12.5)
Monocytes Relative: 7 %
NEUTROS PCT: 52 %
Neutro Abs: 2600 cells/uL (ref 1500–7800)
Platelets: 322 10*3/uL (ref 140–400)
RBC: 3.99 MIL/uL (ref 3.80–5.10)
RDW: 12.8 % (ref 11.0–15.0)
WBC: 5 10*3/uL (ref 3.8–10.8)

## 2017-02-15 LAB — LIPASE: Lipase: 48 U/L (ref 7–60)

## 2017-02-15 LAB — URINALYSIS, MICROSCOPIC ONLY
BACTERIA UA: NONE SEEN [HPF]
Casts: NONE SEEN [LPF]
Crystals: NONE SEEN [HPF]
WBC, UA: NONE SEEN WBC/HPF (ref <=5)
YEAST: NONE SEEN [HPF]

## 2017-02-16 DIAGNOSIS — M170 Bilateral primary osteoarthritis of knee: Secondary | ICD-10-CM | POA: Diagnosis not present

## 2017-02-22 ENCOUNTER — Telehealth: Payer: Self-pay | Admitting: Family Medicine

## 2017-02-22 ENCOUNTER — Telehealth: Payer: Self-pay

## 2017-02-22 ENCOUNTER — Other Ambulatory Visit: Payer: Self-pay | Admitting: Family Medicine

## 2017-02-22 MED ORDER — OXYBUTYNIN CHLORIDE 5 MG/5ML PO SYRP: 5.0000 mg | ORAL_SOLUTION | Freq: Two times a day (BID) | ORAL | 3 refills | Status: DC

## 2017-02-22 NOTE — Progress Notes (Signed)
outynin syrup

## 2017-02-22 NOTE — Telephone Encounter (Signed)
Message left for patient

## 2017-02-22 NOTE — Telephone Encounter (Signed)
Will change to oxybutnin syrup to see if cheaper

## 2017-02-22 NOTE — Telephone Encounter (Signed)
Oxybutynin syrup sent to pharm,acy for pt pls let her know ins requirement etc , I tried to call her no answer

## 2017-02-23 DIAGNOSIS — M170 Bilateral primary osteoarthritis of knee: Secondary | ICD-10-CM | POA: Diagnosis not present

## 2017-02-24 ENCOUNTER — Ambulatory Visit: Payer: Medicare Other | Admitting: "Endocrinology

## 2017-02-24 ENCOUNTER — Other Ambulatory Visit: Payer: Self-pay | Admitting: "Endocrinology

## 2017-02-24 DIAGNOSIS — E890 Postprocedural hypothyroidism: Secondary | ICD-10-CM | POA: Diagnosis not present

## 2017-02-25 LAB — T4, FREE: Free T4: 1.7 ng/dL (ref 0.8–1.8)

## 2017-02-25 LAB — TSH: TSH: 0.12 mIU/L — ABNORMAL LOW

## 2017-03-02 ENCOUNTER — Encounter: Payer: Self-pay | Admitting: Family Medicine

## 2017-03-02 ENCOUNTER — Ambulatory Visit (INDEPENDENT_AMBULATORY_CARE_PROVIDER_SITE_OTHER): Payer: Medicare Other | Admitting: Family Medicine

## 2017-03-02 ENCOUNTER — Encounter: Payer: Self-pay | Admitting: Gastroenterology

## 2017-03-02 VITALS — BP 140/88 | HR 73 | Resp 16 | Ht 65.0 in | Wt 185.0 lb

## 2017-03-02 DIAGNOSIS — R1013 Epigastric pain: Secondary | ICD-10-CM | POA: Diagnosis not present

## 2017-03-02 DIAGNOSIS — H6501 Acute serous otitis media, right ear: Secondary | ICD-10-CM | POA: Diagnosis not present

## 2017-03-02 DIAGNOSIS — H6690 Otitis media, unspecified, unspecified ear: Secondary | ICD-10-CM | POA: Insufficient documentation

## 2017-03-02 DIAGNOSIS — J302 Other seasonal allergic rhinitis: Secondary | ICD-10-CM

## 2017-03-02 DIAGNOSIS — I10 Essential (primary) hypertension: Secondary | ICD-10-CM | POA: Diagnosis not present

## 2017-03-02 DIAGNOSIS — J0100 Acute maxillary sinusitis, unspecified: Secondary | ICD-10-CM | POA: Diagnosis not present

## 2017-03-02 MED ORDER — AZELASTINE HCL 0.1 % NA SOLN: 2.0000 | Freq: Two times a day (BID) | NASAL | 12 refills | Status: DC

## 2017-03-02 MED ORDER — AZITHROMYCIN 250 MG PO TABS: ORAL_TABLET | ORAL | 0 refills | Status: DC

## 2017-03-02 NOTE — Assessment & Plan Note (Signed)
Z pack prescribed 

## 2017-03-02 NOTE — Assessment & Plan Note (Addendum)
Sub optima  Control, no change in medication DASH diet and commitment to daily physical activity for a minimum of 30 minutes discussed and encouraged, as a part of hypertension management. The importance of attaining a healthy weight is also discussed.  BP/Weight 03/02/2017 02/14/2017 01/18/2017 01/04/2017 11/01/2016 09/26/2016 09/06/2016  Systolic BP 140 140 148 144 134 138 142  Diastolic BP 88 84 82 92 70 84 86  Wt. (Lbs) 185 185 183 179.04 184 182 183  BMI 30.79 30.79 30.45 29.79 30.62 30.29 30.45

## 2017-03-02 NOTE — Assessment & Plan Note (Addendum)
Acute episode of epigastric pain requests GI eval wants upper endo, will refer for evaluation, requests that she see dr Darrick PennaFields

## 2017-03-02 NOTE — Progress Notes (Signed)
   Skip EstimableJudy C Kingma     MRN: [id number]      DOB: 1950/12/20   HPI [name] is here with a 6 day h/o pressure over her  Right cheek and in her right ear, also has been experiencing chills and aches. c/o being concerned about her stomach. Just over 2 weeks ago she experienced severe pain and was directed to the Ed by gI, in short she is requesting her GI Doc to evaluate her and actually wants upper endoscopy done as she believes that "something is wrong" as she had "never experienced pain of this severity before" I advised I will refer and leave further management to the discretion of her  GI Doc. Symptoms have resolved, she took120 mg of protonix daily instead of the 80 mg daily recommended by Dr Delton SeeNelson, unintentionally, and is made  aware of the correct dose which was written on her d/c summary.  ROS See HPI . Denies chest congestion, productive cough or wheezing. Denies chest pains, palpitations and leg swelling Denies abdominal pain, nausea, vomiting,diarrhea or constipation.   Denies dysuria, frequency, hesitancy or incontinence. Denies joint pain, swelling and limitation in mobility. Denies headaches, seizures, numbness, or tingling. Denies depression, anxiety or insomnia. Denies skin break down or rash.   PE  BP 140/88   Pulse 73   Resp 16   Ht 5\' 5"  (1.651 m)   Wt 185 lb (83.9 kg)   SpO2 96%   BMI 30.79 kg/m   Patient alert and oriented and in no cardiopulmonary distress.  HEENT: No facial asymmetry, EOMI,   oropharynx pink and moist.  Neck supple no JVD, no mass.Right maxillary sinus tender and right TM mildly erythematous with dull light reflex. Nasal mucosa erythematous and edematous  Chest: Clear to auscultation bilaterally.  CVS: S1, S2 no murmurs, no S3.Regular rate.  ABD: Soft non tender.   Ext: No edema  MS: Adequate ROM spine, shoulders, hips and knees.  Skin: Intact, no ulcerations or rash noted.  Psych: Good eye contact, normal affect. Memory intact  not anxious or depressed appearing.  CNS: CN 2-12 intact, power,  normal throughout.no focal deficits noted.   Assessment & Plan  .Epigastric pain Acute episode of epigastric pain requests GI eval wants upper endo, will refer for evaluation, requests that she see dr Darrick PennaFields  Maxillary sinusitis, acute Z pack prescribed  Otitis media Z pack prescribed  Allergic rhinitis Uncontrolled , add daily Astelin, appropriate technique demonstrated  Essential hypertension Sub optima  Control, no change in medication DASH diet and commitment to daily physical activity for a minimum of 30 minutes discussed and encouraged, as a part of hypertension management. The importance of attaining a healthy weight is also discussed.  BP/Weight 03/02/2017 02/14/2017 01/18/2017 01/04/2017 11/01/2016 09/26/2016 09/06/2016  Systolic BP 140 140 148 144 134 138 142  Diastolic BP 88 84 82 92 70 84 86  Wt. (Lbs) 185 185 183 179.04 184 182 183  BMI 30.79 30.79 30.45 29.79 30.62 30.29 30.45

## 2017-03-02 NOTE — Patient Instructions (Addendum)
Physical exam Oct 12 or after, call if you need me before  You are treated for right ear infection and sinus infection also uncontrolled allergies  You are referred to Dr Ysidro EvertFields   PLEASE take protonix as prescribed ONE TWICE daily

## 2017-03-02 NOTE — Assessment & Plan Note (Signed)
Uncontrolled , add daily Astelin, appropriate technique demonstrated

## 2017-03-06 ENCOUNTER — Ambulatory Visit (INDEPENDENT_AMBULATORY_CARE_PROVIDER_SITE_OTHER): Payer: Medicare Other | Admitting: "Endocrinology

## 2017-03-06 ENCOUNTER — Encounter: Payer: Self-pay | Admitting: "Endocrinology

## 2017-03-06 VITALS — BP 144/74 | HR 71 | Ht 65.0 in | Wt 183.0 lb

## 2017-03-06 DIAGNOSIS — E890 Postprocedural hypothyroidism: Secondary | ICD-10-CM | POA: Diagnosis not present

## 2017-03-06 MED ORDER — LEVOTHYROXINE SODIUM 112 MCG PO TABS: ORAL_TABLET | ORAL | 6 refills | Status: DC

## 2017-03-06 NOTE — Progress Notes (Signed)
HPI  Laura [name] is a 66 y.o.-year-old female,  She has  medical h/o Goiter s/p Lt sided hemithyroidectomy 20+ years ago,  unremarkable thyroid u/s in 2012.   She remained on levothyroxine 125 g.  She is compliant taking her medication properly.  She has stable body weight , complains of fatigue,  cold intolerance.  Pt denies feeling nodules in neck, hoarseness, dysphagia/odynophagia, SOB with lying down.  No h/o radiation tx to head or neck. No recent use of iodine supplements.   ROS: Constitutional: + weight loss, no fatigue, no subjective hyperthermia. She c/o cold hands . Eyes: no blurry vision, no xerophthalmia ENT: no sore throat, no nodules palpated in throat, no dysphagia/odynophagia, no hoarseness Cardiovascular: no CP/SOB/palpitations/leg swelling Respiratory: no cough/SOB Gastrointestinal: no N/V/D/C Musculoskeletal: no muscle/joint aches Skin: no rashes Neurological: no tremors/numbness/tingling/dizziness Psychiatric: no depression/anxiety  PE: BP (!) 144/74   Pulse 71   Ht 5' 5"  (1.651 m)   Wt 183 lb (83 kg)   BMI 30.45 kg/m  Wt Readings from Last 3 Encounters:  03/06/17 183 lb (83 kg)  03/02/17 185 lb (83.9 kg)  02/14/17 185 lb (83.9 kg)   Constitutional: overweight, in NAD Eyes: PERRLA, EOMI, no exophthalmos ENT: moist mucous membranes, no thyromegaly, no cervical lymphadenopathy Cardiovascular: RRR, No MRG Respiratory: CTA B Gastrointestinal: abdomen soft, NT, ND, BS+ Musculoskeletal: no deformities, strength intact in all 4 Skin: moist, warm, no rashes Neurological: no tremor with outstretched hands, DTR normal in all 4  Recent Results (from the past 2160 hour(s))  CBC/Diff Ambiguous Default     Status: Abnormal   Collection Time: 12/20/16  9:14 AM  Result Value Ref Range   WBC 4.3 3.4 - 10.8 x10E3/uL   RBC 3.91 3.77 - 5.28 x10E6/uL   Hemoglobin 10.5 (L) 11.1 - 15.9 g/dL   Hematocrit 34.0 34.0 - 46.6 %   MCV 87 79 - 97 fL   MCH 26.9 26.6 -  33.0 pg   MCHC 30.9 (L) 31.5 - 35.7 g/dL   RDW 13.1 12.3 - 15.4 %   Platelets 330 150 - 379 x10E3/uL   Neutrophils 45 Not Estab. %   Lymphs 44 Not Estab. %   Monocytes 8 Not Estab. %   Eos 2 Not Estab. %   Basos 1 Not Estab. %   Neutrophils Absolute 2.0 1.4 - 7.0 x10E3/uL   Lymphocytes Absolute 1.9 0.7 - 3.1 x10E3/uL   Monocytes Absolute 0.4 0.1 - 0.9 x10E3/uL   EOS (ABSOLUTE) 0.1 0.0 - 0.4 x10E3/uL   Basophils Absolute 0.0 0.0 - 0.2 x10E3/uL   Immature Granulocytes 0 Not Estab. %   Immature Grans (Abs) 0.0 0.0 - 0.1 x10E3/uL    Comment: A hand-written panel/profile was received from your office. In accordance with the [facility] Ambiguous Test Code Policy dated July 9509, we have assigned CBC with Differential/Platelet, Test Code #005009 to this request. If this is not the testing you wished to receive on this specimen, please contact the [facility] Department to clarify the test order. We appreciate your business.   Comprehensive metabolic panel     Status: None   Collection Time: 12/20/16  9:14 AM  Result Value Ref Range   Glucose 94 65 - 99 mg/dL   BUN 11 8 - 27 mg/dL   Creatinine, Ser 0.85 0.57 - 1.00 mg/dL   GFR calc non Af Amer 72 >59   GFR calc Af Amer 83 >59   BUN/Creatinine Ratio 13 12 -  28   Sodium 140 134 - 144 mmol/L   Potassium 4.1 3.5 - 5.2 mmol/L   Chloride 97 96 - 106 mmol/L   CO2 26 18 - 29 mmol/L   Calcium 9.4 8.7 - 10.3 mg/dL   Total Protein 7.0 6.0 - 8.5 g/dL   Albumin 3.8 3.6 - 4.8 g/dL   Globulin, Total 3.2 1.5 - 4.5   Albumin/Globulin Ratio 1.2 1.2 - 2.2   Bilirubin Total 0.3 0.0 - 1.2 mg/dL   Alkaline Phosphatase 98 39 - 117 IU/L   AST 19 0 - 40 IU/L   ALT 16 0 - 32 IU/L  Lipid Panel w/o Chol/HDL Ratio     Status: Abnormal   Collection Time: 12/20/16  9:14 AM  Result Value Ref Range   Cholesterol, Total 195 100 - 199 mg/dL   Triglycerides 118 0 - 149 mg/dL   HDL 54 >39 mg/dL   VLDL Cholesterol Cal 24 5 - 40    LDL Calculated 117 (H) 0 - 99  VITAMIN D 25 Hydroxy (Vit-D Deficiency, Fractures)     Status: None   Collection Time: 12/20/16  9:14 AM  Result Value Ref Range   Vit D, 25-Hydroxy 30.3 30.0 - 100.0 ng/mL    Comment: Vitamin D deficiency has been defined by the Institute of Medicine and an Endocrine Society practice guideline as a level of serum 25-OH vitamin D less than 20 ng/mL (1,2). The Endocrine Society went on to further define vitamin D insufficiency as a level between 21 and 29 ng/mL (2). 1. IOM (Institute of Medicine). 2010. Dietary reference    intakes for calcium and D. [place]: The    [employer]. 2. Holick MF, [place], Bischoff-Ferrari HA, et al.    Evaluation, treatment, and prevention of vitamin D    deficiency: an Endocrine Society clinical practice    guideline. JCEM. 2011 Jul; 96(7):1911-30.   Isac Caddy CMP14 Default     Status: None   Collection Time: 12/20/16  9:14 AM  Result Value Ref Range   Ambig Abbrev CMP14 Default Comment     Comment: A hand-written panel/profile was received from your office. In accordance with the [facility] Ambiguous Test Code Policy dated July 4917, we have completed your order by using the closest currently or formerly recognized AMA panel.  We have assigned Comprehensive Metabolic Panel (14), Test Code #[id number] to this request.  If this is not the testing you wished to receive on this specimen, please contact the [facility] Client Inquiry/Technical Services Department to clarify the test order.  We appreciate your business.   Ambig Abbrev LP Default     Status: None   Collection Time: 12/20/16  9:14 AM  Result Value Ref Range   AMBIG ABBREV Comment     Comment: A hand-written panel/profile was received from your office. In accordance with the [facility] Ambiguous Test Code Policy dated July 9150, we have completed your order by using the closest currently or formerly recognized AMA panel.  We have assigned Lipid  Panel, Test Code [id number] to this request. If this is not the testing you wished to receive on this specimen, please contact the [facility] Client Inquiry/Technical Services Department to clarify the test order.  We appreciate your business.   CBC with Differential/Platelet     Status: Abnormal   Collection Time: 02/14/17  3:33 PM  Result Value Ref Range   WBC 5.0 3.8 - 10.8 K/uL   RBC 3.99 3.80 - 5.10 MIL/uL  Hemoglobin 10.8 (L) 11.7 - 15.5 g/dL   HCT 34.5 (L) 35.0 - 45.0 %   MCV 86.5 80.0 - 100.0 fL   MCH 27.1 27.0 - 33.0 pg   MCHC 31.3 (L) 32.0 - 36.0 g/dL   RDW 12.8 11.0 - 15.0 %   Platelets 322 140 - 400 K/uL   MPV 9.3 7.5 - 12.5 fL   Neutro Abs 2,600 1,500 - 7,800 cells/uL   Lymphs Abs 2,000 850 - 3,900 cells/uL   Monocytes Absolute 350 200 - 950 cells/uL   Eosinophils Absolute 50 15 - 500 cells/uL   Basophils Absolute 0 0 - 200 cells/uL   Neutrophils Relative % 52 %   Lymphocytes Relative 40 %   Monocytes Relative 7 %   Eosinophils Relative 1 %   Basophils Relative 0 %   Smear Review Criteria for review not met   COMPLETE METABOLIC PANEL WITH GFR     Status: None   Collection Time: 02/14/17  3:33 PM  Result Value Ref Range   Sodium 139 135 - 146 mmol/L   Potassium 3.5 3.5 - 5.3 mmol/L   Chloride 101 98 - 110 mmol/L   CO2 27 20 - 31 mmol/L   Glucose, Bld 91 65 - 99 mg/dL   BUN 10 7 - 25 mg/dL   Creat 0.92 0.50 - 0.99 mg/dL    Comment:   For patients > or = 67 years of age: The upper reference limit for Creatinine is approximately 13% higher for people identified as African-American.      Total Bilirubin 0.4 0.2 - 1.2 mg/dL   Alkaline Phosphatase 71 33 - 130 U/L   AST 19 10 - 35 U/L   ALT 15 6 - 29 U/L   Total Protein 6.9 6.1 - 8.1 g/dL   Albumin 3.9 3.6 - 5.1 g/dL   Calcium 9.8 8.6 - 10.4 mg/dL   GFR, Est African American 75 >=60 mL/min   GFR, Est Non African American 65 >=60 mL/min  Urinalysis, Routine w reflex microscopic     Status: Abnormal   Collection  Time: 02/14/17  3:33 PM  Result Value Ref Range   Color, Urine YELLOW YELLOW   APPearance CLEAR CLEAR   Specific Gravity, Urine 1.014 1.001 - 1.035   pH 7.0 5.0 - 8.0   Glucose, UA NEGATIVE NEGATIVE   Bilirubin Urine NEGATIVE NEGATIVE   Ketones, ur NEGATIVE NEGATIVE   Hgb urine dipstick NEGATIVE NEGATIVE   Protein, ur NEGATIVE NEGATIVE   Nitrite NEGATIVE NEGATIVE   Leukocytes, UA TRACE (A) NEGATIVE  Lipase     Status: None   Collection Time: 02/14/17  3:33 PM  Result Value Ref Range   Lipase 48 7 - 60 U/L  Urine Microscopic     Status: None   Collection Time: 02/14/17  3:33 PM  Result Value Ref Range   WBC, UA NONE SEEN <=5 WBC/HPF   RBC / HPF 0-2 <=2 RBC/HPF   Squamous Epithelial / LPF 0-5 <=5 HPF   Bacteria, UA NONE SEEN NONE SEEN HPF   Crystals NONE SEEN NONE SEEN HPF   Casts NONE SEEN NONE SEEN LPF   Yeast NONE SEEN NONE SEEN HPF  TSH     Status: Abnormal   Collection Time: 02/24/17  9:20 AM  Result Value Ref Range   TSH 0.12 (L) mIU/L    Comment:   Reference Range   > or = 20 Years  0.40-4.50   Pregnancy Range First trimester  0.26-2.66 Second trimester 0.55-2.73 Third trimester  0.43-2.91     T4, free     Status: None   Collection Time: 02/24/17  9:20 AM  Result Value Ref Range   Free T4 1.7 0.8 - 1.8 ng/dL   ASSESSMENT: 1. Hypothyroidism, postsurgical  PLAN:  - Heart thyroid function tests are consistent with slight over replacement. - I discussed and lowered her levothyroxine to 112 g by mouth every morning.  - We discussed about correct intake of levothyroxine, at fasting, with water, separated by at least 30 minutes from breakfast, and separated by more than 4 hours from calcium, iron, multivitamins, acid reflux medications (PPIs). I advised her to discontinue Biotin. -Patient is made aware of the fact that thyroid hormone replacement is needed for life, dose to be adjusted by periodic monitoring of thyroid function tests. - Will check thyroid tests  before next visit: TSH, free T4 in 6 months. -Due to absence of clinical goiter, no need for thyroid ultrasound for now.  Glade Lloyd, MD Phone: [phone]  Fax: [phone]   03/06/2017, 11:23 AM

## 2017-03-10 ENCOUNTER — Telehealth: Payer: Self-pay | Admitting: Gastroenterology

## 2017-03-10 NOTE — Telephone Encounter (Signed)
Pt called asking if she could get samples of Linzess 145mg . Please advise and call her at [phone]

## 2017-03-13 NOTE — Telephone Encounter (Signed)
LMOM I have 2 boxes of Linzess 145 mcg at front for pick up for her.

## 2017-03-16 ENCOUNTER — Telehealth: Payer: Self-pay | Admitting: Family Medicine

## 2017-03-16 NOTE — Telephone Encounter (Signed)
Patient came in the office requesting something for her restless legs since she is having difficulty sleeping at night.  She states that she was taking something OTC from [facility] that you had suggested, but it stopped working.  She is requesting something stronger if possible.  Patient uses [facility] [place].

## 2017-03-17 ENCOUNTER — Other Ambulatory Visit: Payer: Self-pay | Admitting: Family Medicine

## 2017-03-17 MED ORDER — GABAPENTIN 100 MG PO CAPS
100.0000 mg | ORAL_CAPSULE | Freq: Every day | ORAL | 4 refills | Status: DC
Start: 2017-03-17 — End: 2018-04-02

## 2017-03-17 NOTE — Telephone Encounter (Signed)
Patient aware med sent in  

## 2017-03-17 NOTE — Telephone Encounter (Signed)
pls let her know that I have sent in gabapentin to her pharmacy for bedtime use

## 2017-03-23 ENCOUNTER — Encounter: Payer: Self-pay | Admitting: Gastroenterology

## 2017-03-23 ENCOUNTER — Ambulatory Visit (INDEPENDENT_AMBULATORY_CARE_PROVIDER_SITE_OTHER): Payer: Medicare Other | Admitting: Gastroenterology

## 2017-03-23 ENCOUNTER — Other Ambulatory Visit: Payer: Self-pay

## 2017-03-23 DIAGNOSIS — R1010 Upper abdominal pain, unspecified: Secondary | ICD-10-CM

## 2017-03-23 DIAGNOSIS — R0989 Other specified symptoms and signs involving the circulatory and respiratory systems: Secondary | ICD-10-CM

## 2017-03-23 DIAGNOSIS — R109 Unspecified abdominal pain: Secondary | ICD-10-CM | POA: Insufficient documentation

## 2017-03-23 DIAGNOSIS — R1013 Epigastric pain: Secondary | ICD-10-CM

## 2017-03-23 NOTE — Assessment & Plan Note (Addendum)
66 year old female with new onset upper abdominal pain since April, stating she saw one episode of "black stool". Hgb has remained within her baseline, recently 10.8. No evidence of further overt GI bleeding or acute blood loss anemia. Associated nausea, no vomiting. Denies use of NSAIDs or aspirin powders. LFTs normal. Last EGD in 2011. Also endorses globus sensation but no solid food dysphagia. Doubt true melena but new onset dyspepsia deserves further evaluation. Although gallbladder remains, doubt this is biliary. She also has chronic constipation, which could be compounding symptoms. Discussed proceeding with EGD in near future and to call if any worsening of symptoms in interim, and I would order a CT.   Proceed with upper endoscopy +/- dilation in the near future with Dr. Darrick PennaFields. The risks, benefits, and alternatives have been discussed in detail with patient. They have stated understanding and desire to proceed.  Phenergan 12.5 mg IV on call  Continue Protonix BID Patient to call if worsening of symptoms or other concerning features 3 month return

## 2017-03-23 NOTE — Progress Notes (Signed)
CC'ED TO PCP 

## 2017-03-23 NOTE — Patient Instructions (Signed)
We have scheduled you for an upper endoscopy (and possible dilation) with Dr. Darrick PennaFields in the near future.  If your pain worsens, call us.   We will also see you in 3 months.

## 2017-03-23 NOTE — Progress Notes (Addendum)
REVIEWED-NO ADDITIONAL RECOMMENDATIONS.  Referring Provider: Kerri Perches, MD Primary Care Physician:  Kerri Perches, MD  Primary GI: Dr. Darrick Penna   Chief Complaint  Patient presents with  . Abdominal Pain    mid abd  . Nausea    HPI:   Laura [name] is a 66 y.o. female presenting today with a history of constipation, GERD. Colonoscopy on file from 2016. Last EGD 2011 with chronic gastritis.    States since April she has had upper abdominal pain. Went to ED but left as "I didn't want to wait until Jesus came back". Notes upper abdominal discomfort. Lots of gas. States pain feels like it is "always there".   States she had a black stool after she noted the abdominal pain. States it was "pitch black". Only one episode. States she was not on iron at that time. Has associated nausea but no vomiting. No NSAIDs or aspirin powders. Taking Protonix BID. Has globus sensation. No solid food dysphagia. Recent CMP and lipase normal. Hgb 10.8, which is at her baseline.   Needs to have a bowel movement now. Linzess cramps her up. Last BM yesterday. Senna tablets help with bowel movements. She would like to stick with this. Rubbing her abdomen now, stating she didn't take Senna today because she was coming here. Some improvement after BM but still feels like it is "burning" upper abdomen and "inflamed".   Past Medical History:  Diagnosis Date  . Anemia   . Anxiety   . Cystitis   . Depression   . Gastritis   . GERD (gastroesophageal reflux disease)   . Hashimoto's thyroiditis    Hx   . Hyperlipidemia   . Hypertension   . Hypothyroidism   . Osteopenia   . Shoulder pain   . Urinary incontinence     Past Surgical History:  Procedure Laterality Date  . Bilateral tubal ligation    . BREAST EXCISIONAL BIOPSY  2010   Excisional biopsy of benign left breast mass -lopoma   . BREAST SURGERY     left nreast biopsy  . COLONOSCOPY  08/26/2011   SLF: 1. Internal hemorrhoids  .  COLONOSCOPY N/A 10/06/2015   ZOX:[id number] fissure or internal hemorrhoids/mild sigmoid colitis. benign colonic path   . colonscopy  2005   Dr. Katrinka Blazing  . ESOPHAGOGASTRODUODENOSCOPY  12/2009   chronic gastritis  . left knee surgery Left March 1,2017  . Resection of left lobe of thyroid    . right carpal tunnel release    . rt. knee athroscopy  2004  . TOTAL ABDOMINAL HYSTERECTOMY  1994  . UMBILICAL HERNIA REPAIR    . Urethral dilation for stenosis  2009    Current Outpatient Prescriptions  Medication Sig Dispense Refill  . atorvastatin (LIPITOR) 20 MG tablet TAKE 1 TABLET BY MOUTH DAILY 30 tablet 3  . azelastine (ASTELIN) 0.1 % nasal spray Place 2 sprays into both nostrils 2 (two) times daily. Use in each nostril as directed 30 mL 12  . citalopram (CELEXA) 10 MG tablet TAKE 1 TABLET(10 MG) BY MOUTH DAILY 90 tablet 1  . ferrous sulfate 325 (65 FE) MG tablet Take 325 mg by mouth 2 (two) times daily with a meal.    . gabapentin (NEURONTIN) 100 MG capsule Take 1 capsule (100 mg total) by mouth at bedtime. 30 capsule 4  . hydrochlorothiazide (HYDRODIURIL) 25 MG tablet TAKE 1 TABLET BY MOUTH DAILY 90 tablet 1  . levothyroxine (SYNTHROID, LEVOTHROID) 112 MCG tablet TAKE  1 TABLET(112 MCG) BY MOUTH DAILY BEFORE BREAKFAST 30 tablet 6  . loratadine (CLARITIN) 10 MG tablet Take 1 tablet (10 mg total) by mouth daily. 30 tablet 3  . losartan (COZAAR) 50 MG tablet Take 1 tablet (50 mg total) by mouth daily. 90 tablet 3  . Multiple Vitamin (MULTIVITAMIN) tablet Take 1 tablet by mouth daily.    Marland Kitchen. oxybutynin (DITROPAN) 5 MG/5ML syrup Take 5 mLs (5 mg total) by mouth 2 (two) times daily. 300 mL 3  . pantoprazole (PROTONIX) 40 MG tablet Take 1 tablet (40 mg total) by mouth 2 (two) times daily. 180 tablet 1  . vitamin E 400 UNIT capsule Take 400 Units by mouth daily.     No current facility-administered medications for this visit.     Allergies as of 03/23/2017 - Review Complete 03/06/2017  Allergen Reaction  Noted  . Dilaudid [hydromorphone hcl]  03/01/2011  . Levaquin [levofloxacin] Other (See Comments) 07/28/2014  . Nsaids  01/13/2010    Family History  Problem Relation Age of Onset  . Colon cancer Neg Hx   . Anesthesia problems Neg Hx   . Hypotension Neg Hx   . Malignant hyperthermia Neg Hx   . Pseudochol deficiency Neg Hx     Social History   Social History  . Marital status: Married    Spouse name: N/A  . Number of children: 3  . Years of education: N/A   Occupational History  . 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 Smoking status: Former Smoker    Packs/day: 0.50    Types: Cigarettes    Quit date: 08/25/1972  . Smokeless tobacco: Never Used  . Alcohol use No  . Drug use: No  . Sexual activity: Yes   Other Topics Concern  . None   Social History Narrative  . None    Review of Systems: As mentioned in HPI   Physical Exam: BP 138/80   Pulse 69   Temp 98 F (36.7 C) (Oral)   Ht 5\' 4"  (1.626 m)   Wt 184 lb 6.4 oz (83.6 kg)   BMI 31.65 kg/m  General:   Alert and oriented. No distress noted. Pleasant and cooperative.  Head:  Normocephalic and atraumatic. Eyes:  Conjuctiva clear without scleral icterus. Mouth:  Oral mucosa pink and moist.  Heart:  S1, S2 present without murmurs Abdomen:  +BS, soft, mild to moderate TTP epigastric region and non-distended. No rebound or guarding. No HSM or masses noted. Msk:  Symmetrical without gross deformities. Normal posture. Extremities:  Without edema. Neurologic:  Alert and  oriented x4;  grossly normal neurologically. Psych:  Alert and cooperative. Normal mood and affect.  Lab Results  Component Value Date   WBC 5.0 02/14/2017   HGB 10.8 (L) 02/14/2017   HCT 34.5 (L) 02/14/2017   MCV 86.5 02/14/2017   PLT 322 02/14/2017   Lab Results  Component Value Date   ALT 15 02/14/2017   AST 19 02/14/2017   ALKPHOS 71 02/14/2017   BILITOT 0.4 02/14/2017   Lab Results    Component Value Date   CREATININE 0.92 02/14/2017   BUN 10 02/14/2017   NA 139 02/14/2017   K 3.5 02/14/2017   CL 101 02/14/2017   CO2 27 02/14/2017

## 2017-03-29 ENCOUNTER — Other Ambulatory Visit: Payer: Self-pay | Admitting: Family Medicine

## 2017-04-06 ENCOUNTER — Ambulatory Visit (INDEPENDENT_AMBULATORY_CARE_PROVIDER_SITE_OTHER): Payer: Medicare Other | Admitting: Family Medicine

## 2017-04-06 ENCOUNTER — Encounter: Payer: Self-pay | Admitting: Family Medicine

## 2017-04-06 VITALS — BP 120/80 | HR 68 | Resp 16 | Ht 64.0 in | Wt 184.0 lb

## 2017-04-06 DIAGNOSIS — F411 Generalized anxiety disorder: Secondary | ICD-10-CM

## 2017-04-06 DIAGNOSIS — K2100 Gastro-esophageal reflux disease with esophagitis, without bleeding: Secondary | ICD-10-CM

## 2017-04-06 DIAGNOSIS — I10 Essential (primary) hypertension: Secondary | ICD-10-CM | POA: Diagnosis not present

## 2017-04-06 NOTE — Patient Instructions (Signed)
F/u as before, call if you need me sooner  Blood pressure is excellent    Please see about your knees

## 2017-04-08 NOTE — Progress Notes (Signed)
   Laura [name]     MRN: [id number]      DOB: 10-28-1951   HPI Laura [name] is here because she felt "funny and off balance" one day ago and states when she checked her blood pressure at home it was high at 160, so she called in to be seen Denies chest pain, palpitations, fever, chills , sinus pressure , increased stress or  Anxiety. She has not brought the cuff that she used at home unfortunately  ROS See HPI  Denies abdominal pain, nausea, vomiting,diarrhea or constipation.   Denies dysuria, frequency, hesitancy or incontinence. C/o increased posterior right knee pain limiting mobility, has upcoming appt with her orthopedic Doc Denies headaches, seizures, numbness, or tingling. Denies depression, anxiety or insomnia. Denies skin break down or rash.   PE  BP 120/80   Pulse 68   Resp 16   Ht 5\' 4"  (1.626 m)   Wt 184 lb (83.5 kg)   SpO2 98%   BMI 31.58 kg/m   Patient alert and oriented and in no cardiopulmonary distress.  HEENT: No facial asymmetry, EOMI,   oropharynx pink and moist.  Neck supple no JVD, no mass.No nysta Chest: Clear to auscultation bilaterally.  CVS: S1, S2 no murmurs, no S3.Regular rate.  ABD: Soft non tender.   Ext: No edema  MS: Adequate ROM spine, shoulders, hips and reduced in right knee.  Skin: Intact, no ulcerations or rash noted.  Psych: Good eye contact, normal affect. Memory intact not anxious or depressed appearing.  CNS: CN 2-12 intact, power,  normal throughout.no focal deficits noted.   Assessment & Plan  Essential hypertension Controlled, no change in medication DASH diet and commitment to daily physical activity for a minimum of 30 minutes discussed and encouraged, as a part of hypertension management. The importance of attaining a healthy weight is also discussed.  BP/Weight 04/06/2017 03/23/2017 03/06/2017 03/02/2017 02/14/2017 01/18/2017 01/04/2017  Systolic BP 120 138 144 140 140 148 144  Diastolic BP 80 80 74 88 84 82 92  Wt. (Lbs)  184 184.4 183 185 185 183 179.04  BMI 31.58 31.65 30.45 30.79 30.79 30.45 29.79       GAD (generalized anxiety disorder) Controlled, no change in medication   GERD Controlled, no change in medication

## 2017-04-08 NOTE — Assessment & Plan Note (Signed)
Controlled, no change in medication  

## 2017-04-08 NOTE — Assessment & Plan Note (Signed)
Controlled, no change in medication DASH diet and commitment to daily physical activity for a minimum of 30 minutes discussed and encouraged, as a part of hypertension management. The importance of attaining a healthy weight is also discussed.  BP/Weight 04/06/2017 03/23/2017 03/06/2017 03/02/2017 02/14/2017 01/18/2017 01/04/2017  Systolic BP 120 138 144 140 140 148 144  Diastolic BP 80 80 74 88 84 82 92  Wt. (Lbs) 184 184.4 183 185 185 183 179.04  BMI 31.58 31.65 30.45 30.79 30.79 30.45 29.79

## 2017-04-13 DIAGNOSIS — M1712 Unilateral primary osteoarthritis, left knee: Secondary | ICD-10-CM | POA: Diagnosis not present

## 2017-04-13 DIAGNOSIS — M1711 Unilateral primary osteoarthritis, right knee: Secondary | ICD-10-CM | POA: Diagnosis not present

## 2017-04-21 DIAGNOSIS — M1711 Unilateral primary osteoarthritis, right knee: Secondary | ICD-10-CM | POA: Diagnosis not present

## 2017-04-21 DIAGNOSIS — M1712 Unilateral primary osteoarthritis, left knee: Secondary | ICD-10-CM | POA: Diagnosis not present

## 2017-04-27 DIAGNOSIS — M23331 Other meniscus derangements, other medial meniscus, right knee: Secondary | ICD-10-CM | POA: Diagnosis not present

## 2017-04-27 DIAGNOSIS — M1711 Unilateral primary osteoarthritis, right knee: Secondary | ICD-10-CM | POA: Diagnosis not present

## 2017-04-27 DIAGNOSIS — M1712 Unilateral primary osteoarthritis, left knee: Secondary | ICD-10-CM | POA: Diagnosis not present

## 2017-04-29 ENCOUNTER — Other Ambulatory Visit: Payer: Self-pay | Admitting: "Endocrinology

## 2017-05-01 MED ORDER — LEVOTHYROXINE SODIUM 112 MCG PO TABS: ORAL_TABLET | ORAL | 0 refills | Status: DC

## 2017-05-08 DIAGNOSIS — H524 Presbyopia: Secondary | ICD-10-CM | POA: Diagnosis not present

## 2017-05-08 DIAGNOSIS — H5203 Hypermetropia, bilateral: Secondary | ICD-10-CM | POA: Diagnosis not present

## 2017-05-08 DIAGNOSIS — H52223 Regular astigmatism, bilateral: Secondary | ICD-10-CM | POA: Diagnosis not present

## 2017-05-12 ENCOUNTER — Telehealth: Payer: Self-pay | Admitting: Family Medicine

## 2017-05-12 ENCOUNTER — Other Ambulatory Visit: Payer: Self-pay | Admitting: Orthopedic Surgery

## 2017-05-12 DIAGNOSIS — M1711 Unilateral primary osteoarthritis, right knee: Secondary | ICD-10-CM | POA: Diagnosis not present

## 2017-05-12 DIAGNOSIS — M170 Bilateral primary osteoarthritis of knee: Secondary | ICD-10-CM | POA: Diagnosis not present

## 2017-05-12 DIAGNOSIS — M1712 Unilateral primary osteoarthritis, left knee: Secondary | ICD-10-CM

## 2017-05-12 NOTE — Telephone Encounter (Signed)
Left message for pt to call office

## 2017-05-12 NOTE — Telephone Encounter (Signed)
Patient requesting   oxybutynin (DITROPAN) 5 MG/5ML syrup and she was told that if she did not get the extended release the Rx would be cheaper.  She would like to go with the Rx that costs less.    [facility] [place]

## 2017-05-17 ENCOUNTER — Encounter (HOSPITAL_COMMUNITY): Payer: Self-pay | Admitting: *Deleted

## 2017-05-17 ENCOUNTER — Encounter (HOSPITAL_COMMUNITY)
Admission: RE | Disposition: A | Discharge status: Home / Self Care | Payer: Self-pay | Source: Ambulatory Visit | Attending: Gastroenterology

## 2017-05-17 ENCOUNTER — Ambulatory Visit (HOSPITAL_COMMUNITY)
Admission: RE | Admit: 2017-05-17 | Discharge: 2017-05-17 | Disposition: A | Discharge status: Home / Self Care | Payer: Medicare Other | Source: Ambulatory Visit | Attending: Gastroenterology | Admitting: Gastroenterology

## 2017-05-17 DIAGNOSIS — I10 Essential (primary) hypertension: Secondary | ICD-10-CM | POA: Diagnosis not present

## 2017-05-17 DIAGNOSIS — K317 Polyp of stomach and duodenum: Secondary | ICD-10-CM | POA: Insufficient documentation

## 2017-05-17 DIAGNOSIS — Z87891 Personal history of nicotine dependence: Secondary | ICD-10-CM | POA: Diagnosis not present

## 2017-05-17 DIAGNOSIS — Z79899 Other long term (current) drug therapy: Secondary | ICD-10-CM | POA: Insufficient documentation

## 2017-05-17 DIAGNOSIS — K219 Gastro-esophageal reflux disease without esophagitis: Secondary | ICD-10-CM | POA: Insufficient documentation

## 2017-05-17 DIAGNOSIS — R1012 Left upper quadrant pain: Secondary | ICD-10-CM | POA: Diagnosis not present

## 2017-05-17 DIAGNOSIS — Z888 Allergy status to other drugs, medicaments and biological substances status: Secondary | ICD-10-CM | POA: Insufficient documentation

## 2017-05-17 DIAGNOSIS — M8580 Other specified disorders of bone density and structure, unspecified site: Secondary | ICD-10-CM | POA: Diagnosis not present

## 2017-05-17 DIAGNOSIS — E785 Hyperlipidemia, unspecified: Secondary | ICD-10-CM | POA: Diagnosis not present

## 2017-05-17 DIAGNOSIS — R0989 Other specified symptoms and signs involving the circulatory and respiratory systems: Secondary | ICD-10-CM

## 2017-05-17 DIAGNOSIS — D649 Anemia, unspecified: Secondary | ICD-10-CM | POA: Diagnosis not present

## 2017-05-17 DIAGNOSIS — Z90710 Acquired absence of both cervix and uterus: Secondary | ICD-10-CM | POA: Diagnosis not present

## 2017-05-17 DIAGNOSIS — K5900 Constipation, unspecified: Secondary | ICD-10-CM | POA: Diagnosis not present

## 2017-05-17 DIAGNOSIS — E063 Autoimmune thyroiditis: Secondary | ICD-10-CM | POA: Insufficient documentation

## 2017-05-17 DIAGNOSIS — F419 Anxiety disorder, unspecified: Secondary | ICD-10-CM | POA: Diagnosis not present

## 2017-05-17 DIAGNOSIS — R1011 Right upper quadrant pain: Secondary | ICD-10-CM | POA: Diagnosis not present

## 2017-05-17 DIAGNOSIS — F329 Major depressive disorder, single episode, unspecified: Secondary | ICD-10-CM | POA: Diagnosis not present

## 2017-05-17 DIAGNOSIS — R1013 Epigastric pain: Secondary | ICD-10-CM | POA: Diagnosis not present

## 2017-05-17 HISTORY — PX: ESOPHAGOGASTRODUODENOSCOPY: SHX5428

## 2017-05-17 HISTORY — PX: SAVORY DILATION: SHX5439

## 2017-05-17 LAB — VITAMIN B12: VITAMIN B 12: 1393 pg/mL — AB (ref 180–914)

## 2017-05-17 LAB — CBC
HEMATOCRIT: 32.1 % — AB (ref 36.0–46.0)
Hemoglobin: 10.4 g/dL — ABNORMAL LOW (ref 12.0–15.0)
MCH: 27.5 pg (ref 26.0–34.0)
MCHC: 32.4 g/dL (ref 30.0–36.0)
MCV: 84.9 fL (ref 78.0–100.0)
Platelets: 306 10*3/uL (ref 150–400)
RBC: 3.78 MIL/uL — ABNORMAL LOW (ref 3.87–5.11)
RDW: 12 % (ref 11.5–15.5)
WBC: 4.2 10*3/uL (ref 4.0–10.5)

## 2017-05-17 LAB — FERRITIN: FERRITIN: 102 ng/mL (ref 11–307)

## 2017-05-17 SURGERY — EGD (ESOPHAGOGASTRODUODENOSCOPY)
Anesthesia: Moderate Sedation

## 2017-05-17 MED ORDER — SIMETHICONE 40 MG/0.6ML PO SUSP
ORAL | Status: AC
  Filled 2017-05-17: qty 30

## 2017-05-17 MED ORDER — MIDAZOLAM HCL 5 MG/5ML IJ SOLN
INTRAMUSCULAR | Status: DC | PRN
  Administered 2017-05-17 (×2): 2 mg via INTRAVENOUS

## 2017-05-17 MED ORDER — SODIUM CHLORIDE 0.9% FLUSH
INTRAVENOUS | Status: AC
  Filled 2017-05-17: qty 10

## 2017-05-17 MED ORDER — PROMETHAZINE HCL 25 MG/ML IJ SOLN
INTRAMUSCULAR | Status: DC
Start: 2017-05-17 — End: 2017-05-17
  Filled 2017-05-17: qty 1

## 2017-05-17 MED ORDER — MEPERIDINE HCL 100 MG/ML IJ SOLN
INTRAMUSCULAR | Status: AC
  Filled 2017-05-17: qty 2

## 2017-05-17 MED ORDER — MIDAZOLAM HCL 5 MG/5ML IJ SOLN
INTRAMUSCULAR | Status: AC
  Filled 2017-05-17: qty 10

## 2017-05-17 MED ORDER — PROMETHAZINE HCL 25 MG/ML IJ SOLN
12.5000 mg | Freq: Once | INTRAMUSCULAR | Status: AC
  Administered 2017-05-17: 12.5 mg via INTRAVENOUS

## 2017-05-17 MED ORDER — LINACLOTIDE 72 MCG PO CAPS: 72.0000 ug | ORAL_CAPSULE | Freq: Every day | ORAL | 11 refills | Status: DC

## 2017-05-17 MED ORDER — SODIUM CHLORIDE 0.9 % IV SOLN
INTRAVENOUS | Status: DC
  Administered 2017-05-17: 08:00:00 via INTRAVENOUS

## 2017-05-17 MED ORDER — LIDOCAINE VISCOUS 2 % MT SOLN
OROMUCOSAL | Status: DC | PRN
  Administered 2017-05-17: 1 via OROMUCOSAL

## 2017-05-17 MED ORDER — STERILE WATER FOR IRRIGATION IR SOLN
Status: DC | PRN
  Administered 2017-05-17: 2.5 mL

## 2017-05-17 MED ORDER — MINERAL OIL PO OIL
TOPICAL_OIL | ORAL | Status: AC
  Filled 2017-05-17: qty 30

## 2017-05-17 MED ORDER — MEPERIDINE HCL 100 MG/ML IJ SOLN
INTRAMUSCULAR | Status: DC | PRN
  Administered 2017-05-17 (×2): 50 mg via INTRAVENOUS

## 2017-05-17 MED ORDER — LIDOCAINE VISCOUS 2 % MT SOLN
OROMUCOSAL | Status: AC
  Filled 2017-05-17: qty 15

## 2017-05-17 NOTE — Discharge Instructions (Signed)
NO SOURCE FOR HER ANEMIA WAS IDENTIFIED. You have BENIGN APPEARING STOMACH POLYPS. I biopsied your SMALL BOWEL.    STOP TAKING IRON. I WILL CHECK YOUR BLOOD COUNT, VITAMIN B12, FOLATE, AND FERRITIN TODAY. IN 2 WEEKS YOU WILL NEED A GIVENS CAPSULE STUDY.   CONTINUE YOUR WEIGHT LOSS EFFORTS. YOUR WEIGHT HAS BEEN STABLE AT 184 LBS SINCE 2016.  WHILE I DO NOT WANT TO ALARM YOU, YOUR BODY MASS INDEX(BMI) IS OVER 30 WHICH MEANS YOU ARE OBESE. OBESITY IS ASSOCIATED WITH AN INCREASE RISK FOR ALL CANCERS, INCLUDING ESOPHAGEAL AND COLON CANCER.IF YOU CAN LOSE TO A WEIGHT OF 174 LBS, YOUR BMI WILL BE LESS THAN 30.    FOLLOW A LOW FAT/HIGH FIBER DIET. MEATS SHOULD BE BAKED, BROILED, OR BOILED. AVOID FRIED FOODS. AVOID ITEMS THAT CAUSE BLOATING & GAS.SEE INFO BELOW.    TO PREVENT CONSTIPATION:      1. CONTINUE LINZESS. TAKE LINZESS 30 MINS PRIOR TO YOUR FIRST MEAL AND IF AFTER 7 DAYS YOU ARE NOT HAVING A SATISFACTORY BOWEL MOVEMENT, TAKE IT WITH YOUR FIRST MEAL.      2. USE SENNA IF NEEDED.   PLEASE CALL IN ONE MONTH IF YOUR CONSTIPATION IS NOT BETTER.  FOLLOW UP IN 4 MOS.  UPPER ENDOSCOPY AFTER CARE Read the instructions outlined below and refer to this sheet in the next week. These discharge instructions provide you with general information on caring for yourself after you leave the hospital. While your treatment has been planned according to the most current medical practices available, unavoidable complications occasionally occur. If you have any problems or questions after discharge, call DR. Bashar Milam, [phone].  ACTIVITY  You may resume your regular activity, but move at a slower pace for the next 24 hours.   Take frequent rest periods for the next 24 hours.   Walking will help get rid of the air and reduce the bloated feeling in your belly (abdomen).   No driving for 24 hours (because of the medicine (anesthesia) used during the test).   You may shower.   Do not sign any important  legal documents or operate any machinery for 24 hours (because of the anesthesia used during the test).    NUTRITION  Drink plenty of fluids.   You may resume your normal diet as instructed by your doctor.   Begin with a light meal and progress to your normal diet. Heavy or fried foods are harder to digest and may make you feel sick to your stomach (nauseated).   Avoid alcoholic beverages for 24 hours or as instructed.    MEDICATIONS  You may resume your normal medications.   WHAT YOU CAN EXPECT TODAY  Some feelings of bloating in the abdomen.   Passage of more gas than usual.    IF YOU HAD A BIOPSY TAKEN DURING THE UPPER ENDOSCOPY:  Eat a soft diet IF YOU HAVE NAUSEA, BLOATING, ABDOMINAL PAIN, OR VOMITING.    FINDING OUT THE RESULTS OF YOUR TEST Not all test results are available during your visit. DR. Darrick PennaFIELDS WILL CALL YOU WITHIN 14 DAYS OF YOUR PROCEDUE WITH YOUR RESULTS. Do not assume everything is normal if you have not heard from DR. Wille Aubuchon, CALL HER OFFICE AT [phone].  SEEK IMMEDIATE MEDICAL ATTENTION AND CALL THE OFFICE: [phone] IF:  You have more than a spotting of blood in your stool.   Your belly is swollen (abdominal distention).   You are nauseated or vomiting.   You have a temperature over 101F.  You have abdominal pain or discomfort that is severe or gets worse throughout the day.    Low-Fat Diet  BREADS, CEREALS, PASTA, RICE, DRIED PEAS, AND BEANS These products are high in carbohydrates and most are low in fat. Therefore, they can be increased in the diet as substitutes for fatty foods. They too, however, contain calories and should not be eaten in excess. Cereals can be eaten for snacks as well as for breakfast.  Include foods that contain fiber (fruits, vegetables, whole grains, and legumes). Research shows that fiber may lower blood cholesterol levels, especially the water-soluble fiber found in fruits, vegetables, oat products, and  legumes.  FRUITS AND VEGETABLES It is good to eat fruits and vegetables. Besides being sources of fiber, both are rich in vitamins and some minerals. They help you get the daily allowances of these nutrients. Fruits and vegetables can be used for snacks and desserts.  MEATS Limit lean meat, chicken, [place], and fish to no more than 6 ounces per day.  Beef, Pork, and Lamb Use lean cuts of beef, pork, and lamb. Lean cuts include:  Extra-lean ground beef.  Arm roast.  Sirloin tip.  Center-cut ham.  Round steak.  Loin chops.  Rump roast.  Tenderloin.  Trim all fat off the outside of meats before cooking. It is not necessary to severely decrease the intake of red meat, but lean choices should be made. Lean meat is rich in protein and contains a highly absorbable form of iron. Premenopausal women, in particular, should avoid reducing lean red meat because this could increase the risk for low red blood cells (iron-deficiency anemia).  Chicken and [place] These are good sources of protein. The fat of poultry can be reduced by removing the skin and underlying fat layers before cooking. Chicken and [place] can be substituted for lean red meat in the diet. Poultry should not be fried or covered with high-fat sauces.  Fish and Shellfish Fish is a good source of protein. Shellfish contain cholesterol, but they usually are low in saturated fatty acids. The preparation of fish is important. Like chicken and [place], they should not be fried or covered with high-fat sauces.  EGGS Egg whites contain no fat or cholesterol. They can be eaten often. Try 1 to 2 egg whites instead of whole eggs in recipes or use egg substitutes that do not contain yolk.  MILK AND DAIRY PRODUCTS Use skim or 1% milk instead of 2% or whole milk. Decrease whole milk, natural, and processed cheeses. Use nonfat or low-fat (2%) cottage cheese or low-fat cheeses made from vegetable oils. Choose nonfat or low-fat (1 to 2%) yogurt.  Experiment with evaporated skim milk in recipes that call for heavy cream. Substitute low-fat yogurt or low-fat cottage cheese for sour cream in dips and salad dressings. Have at least 2 servings of low-fat dairy products, such as 2 glasses of skim (or 1%) milk each day to help get your daily calcium intake.  FATS AND OILS Reduce the total intake of fats, especially saturated fat. Butterfat, lard, and beef fats are high in saturated fat and cholesterol. These should be avoided as much as possible. Vegetable fats do not contain cholesterol, but certain vegetable fats, such as coconut oil, palm oil, and palm kernel oil are very high in saturated fats. These should be limited. These fats are often used in bakery goods, processed foods, popcorn, oils, and nondairy creamers. Vegetable shortenings and some peanut butters contain hydrogenated oils, which are also saturated fats. Read  the labels on these foods and check for saturated vegetable oils.  Unsaturated vegetable oils and fats do not raise blood cholesterol. However, they should be limited because they are fats and are high in calories. Total fat should still be limited to 30% of your daily caloric intake. Desirable liquid vegetable oils are corn oil, cottonseed oil, olive oil, canola oil, safflower oil, soybean oil, and sunflower oil. Peanut oil is not as good, but small amounts are acceptable. Buy a heart-healthy tub margarine that has no partially hydrogenated oils in the ingredients. Mayonnaise and salad dressings often are made from unsaturated fats, but they should also be limited because of their high calorie and fat content. Seeds, nuts, peanut butter, olives, and avocados are high in fat, but the fat is mainly the unsaturated type. These foods should be limited mainly to avoid excess calories and fat.  OTHER EATING TIPS Snacks  Most sweets should be limited as snacks. They tend to be rich in calories and fats, and their caloric content outweighs  their nutritional value. Some good choices in snacks are graham crackers, melba toast, soda crackers, bagels (no egg), English muffins, fruits, and vegetables. These snacks are preferable to snack crackers, [place] fries, and chips. Popcorn should be air-popped or cooked in small amounts of liquid vegetable oil.  Desserts Eat fruit, low-fat yogurt, and fruit ices instead of pastries, cake, and cookies. Sherbet, angel food cake, gelatin dessert, frozen low-fat yogurt, or other frozen products that do not contain saturated fat (pure fruit juice bars, frozen ice pops) are also acceptable.   COOKING METHODS Choose those methods that use little or no fat. They include: Poaching.  Braising.  Steaming.  Grilling.  Baking.  Stir-frying.  Broiling.  Microwaving.  Foods can be cooked in a nonstick pan without added fat, or use a nonfat cooking spray in regular cookware. Limit fried foods and avoid frying in saturated fat. Add moisture to lean meats by using water, broth, cooking wines, and other nonfat or low-fat sauces along with the cooking methods mentioned above. Soups and stews should be chilled after cooking. The fat that forms on top after a few hours in the refrigerator should be skimmed off. When preparing meals, avoid using excess salt. Salt can contribute to raising blood pressure in some people.  EATING AWAY FROM HOME Order entres, potatoes, and vegetables without sauces or butter. When meat exceeds the size of a deck of cards (3 to 4 ounces), the rest can be taken home for another meal. Choose vegetable or fruit salads and ask for low-calorie salad dressings to be served on the side. Use dressings sparingly. Limit high-fat toppings, such as bacon, crumbled eggs, cheese, sunflower seeds, and olives. Ask for heart-healthy tub margarine instead of butter.  High-Fiber Diet A high-fiber diet changes your normal diet to include more whole grains, legumes, fruits, and vegetables. Changes in the  diet involve replacing refined carbohydrates with unrefined foods. The calorie level of the diet is essentially unchanged. The Dietary Reference Intake (recommended amount) for adult males is 38 grams per day. For adult females, it is 25 grams per day. Pregnant and lactating women should consume 28 grams of fiber per day.Fiber is the intact part of a plant that is not broken down during digestion. Functional fiber is fiber that has been isolated from the plant to provide a beneficial effect in the body.  PURPOSE  Increase stool bulk.   Ease and regulate bowel movements.   Lower cholesterol.   REDUCE  RISK OF COLON CANCER  INDICATIONS THAT YOU NEED MORE FIBER  Constipation and hemorrhoids.   Uncomplicated diverticulosis (intestine condition) and irritable bowel syndrome.   Weight management.   As a protective measure against hardening of the arteries (atherosclerosis), diabetes, and cancer.   GUIDELINES FOR INCREASING FIBER IN THE DIET  Start adding fiber to the diet slowly. A gradual increase of about 5 more grams (2 slices of whole-wheat bread, 2 servings of most fruits or vegetables, or 1 bowl of high-fiber cereal) per day is best. Too rapid an increase in fiber may result in constipation, flatulence, and bloating.   Drink enough water and fluids to keep your urine clear or pale yellow. Water, juice, or caffeine-free drinks are recommended. Not drinking enough fluid may cause constipation.   Eat a variety of high-fiber foods rather than one type of fiber.   Try to increase your intake of fiber through using high-fiber foods rather than fiber pills or supplements that contain small amounts of fiber.   The goal is to change the types of food eaten. Do not supplement your present diet with high-fiber foods, but replace foods in your present diet.    INCLUDE A VARIETY OF FIBER SOURCES  Replace refined and processed grains with whole grains, canned fruits with fresh fruits, and  incorporate other fiber sources. White rice, white breads, and most bakery goods contain little or no fiber.   Brown whole-grain rice, buckwheat oats, and many fruits and vegetables are all good sources of fiber. These include: broccoli, Brussels sprouts, cabbage, cauliflower, beets, sweet potatoes, white potatoes (skin on), carrots, tomatoes, eggplant, squash, berries, fresh fruits, and dried fruits.   Cereals appear to be the richest source of fiber. Cereal fiber is found in whole grains and bran. Bran is the fiber-rich outer coat of cereal grain, which is largely removed in refining. In whole-grain cereals, the bran remains. In breakfast cereals, the largest amount of fiber is found in those with "bran" in their names. The fiber content is sometimes indicated on the label.   You may need to include additional fruits and vegetables each day.   In baking, for 1 cup white flour, you may use the following substitutions:   1 cup whole-wheat flour minus 2 tablespoons.   1/2 cup white flour plus 1/2 cup whole-wheat flour.

## 2017-05-17 NOTE — H&P (Addendum)
Primary Care Physician:  Kerri Perches, MD Primary Gastroenterologist:  Dr. Darrick Penna  Pre-Procedure History & Physical: HPI:  Laura [name] is a 66 y.o. female here for DYSPEPSIA/UPPER ABDOMINAL PAIN.  Past Medical History:  Diagnosis Date  . Anemia   . Anxiety   . Cystitis   . Depression   . Gastritis   . GERD (gastroesophageal reflux disease)   . Hashimoto's thyroiditis    Hx   . Hyperlipidemia   . Hypertension   . Hypothyroidism   . Osteopenia   . Shoulder pain   . Urinary incontinence     Past Surgical History:  Procedure Laterality Date  . Bilateral tubal ligation    . BREAST EXCISIONAL BIOPSY  2010   Excisional biopsy of benign left breast mass -lopoma   . BREAST SURGERY     left nreast biopsy  . COLONOSCOPY  08/26/2011   SLF: 1. Internal hemorrhoids  . COLONOSCOPY N/A 10/06/2015   ZOX:WRUE fissure or internal hemorrhoids/mild sigmoid colitis. benign colonic path   . colonscopy  2005   Dr. Katrinka Blazing  . ESOPHAGOGASTRODUODENOSCOPY  12/2009   chronic gastritis  . left knee surgery Left March 1,2017  . Resection of left lobe of thyroid    . right carpal tunnel release    . rt. knee athroscopy  2004  . TOTAL ABDOMINAL HYSTERECTOMY  1994  . UMBILICAL HERNIA REPAIR    . Urethral dilation for stenosis  2009    Prior to Admission medications   Medication Sig Start Date End Date Taking? Authorizing Provider  acetaminophen (TYLENOL) 500 MG tablet Take 500-1,000 mg by mouth every 6 (six) hours as needed (for pain.).   Yes [provider]  atorvastatin (LIPITOR) 20 MG tablet TAKE 1 TABLET BY MOUTH DAILY 02/14/17  Yes Kerri Perches, MD  citalopram (CELEXA) 10 MG tablet TAKE 1 TABLET(10 MG) BY MOUTH DAILY 12/22/16  Yes Kerri Perches, MD  diclofenac sodium (VOLTAREN) 1 % GEL Apply 2-4 g topically 4 (four) times daily as needed. For knee pain. 04/13/17  Yes [provider]  ferrous sulfate 325 (65 FE) MG tablet Take 325 mg by mouth daily after  lunch.    Yes [provider]  gabapentin (NEURONTIN) 100 MG capsule Take 1 capsule (100 mg total) by mouth at bedtime. Patient taking differently: Take 100 mg by mouth daily as needed (for restless legs).  03/17/17  Yes Kerri Perches, MD  hydrochlorothiazide (HYDRODIURIL) 25 MG tablet TAKE 1 TABLET BY MOUTH DAILY 02/09/17  Yes Kerri Perches, MD  levothyroxine (SYNTHROID, LEVOTHROID) 112 MCG tablet TAKE 1 TABLET(112 MCG) BY MOUTH DAILY BEFORE BREAKFAST 05/01/17  Yes Nida, Denman George, MD  loratadine (CLARITIN) 10 MG tablet Take 1 tablet (10 mg total) by mouth daily. 01/04/17  Yes Kerri Perches, MD  losartan (COZAAR) 50 MG tablet Take 1 tablet (50 mg total) by mouth daily. 01/18/17  Yes Kerri Perches, MD  Multiple Vitamin (MULTIVITAMIN) tablet Take 1 tablet by mouth daily after breakfast. Centrum Silver   Yes [provider]  oxybutynin (DITROPAN) 5 MG/5ML syrup Take 5 mLs (5 mg total) by mouth 2 (two) times daily. Patient taking differently: Take 2.5 mg by mouth daily.  02/22/17  Yes Kerri Perches, MD  pantoprazole (PROTONIX) 40 MG tablet TAKE 1 TABLET BY MOUTH TWICE DAILY 03/29/17  Yes Kerri Perches, MD  Polyethyl Glycol-Propyl Glycol (SYSTANE) 0.4-0.3 % SOLN Apply 1 drop to eye 3 (three) times daily as  needed (for dry eyes.).   Yes [provider]  traMADol (ULTRAM) 50 MG tablet Take 50-100 mg by mouth every 6 (six) hours as needed. For knee pain. 04/27/17  Yes [provider]  vitamin E 400 UNIT capsule Take 400 Units by mouth daily after breakfast.    Yes [provider]  azelastine (ASTELIN) 0.1 % nasal spray Place 2 sprays into both nostrils 2 (two) times daily. Use in each nostril as directed Patient not taking: Reported on 05/12/2017 03/02/17   Kerri PerchesSimpson, Margaret E, MD    Allergies as of 03/23/2017 - Review Complete 03/06/2017  Allergen Reaction Noted  . Dilaudid [hydromorphone hcl]  03/01/2011  . Levaquin  [levofloxacin] Other (See Comments) 07/28/2014  . Nsaids  01/13/2010    Family History  Problem Relation Age of Onset  . Colon cancer Neg Hx   . Anesthesia problems Neg Hx   . Hypotension Neg Hx   . Malignant hyperthermia Neg Hx   . Pseudochol deficiency Neg Hx     Social History   Social History  . Marital status: Married    Spouse name: N/A  . Number of children: 3  . Years of education: N/A   Occupational History  . 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 Smoking status: Former Smoker    Packs/day: 0.50    Types: Cigarettes    Quit date: 08/25/1972  . Smokeless tobacco: Never Used  . Alcohol use No  . Drug use: No  . Sexual activity: Yes   Other Topics Concern  . Not on file   Social History Narrative  . No narrative on file    Review of Systems: See HPI, otherwise negative ROS   Physical Exam: BP 135/76   Pulse 79   Temp 97.8 F (36.6 C) (Oral)   Resp 20   Ht 5\' 5"  (1.651 m)   Wt 183 lb (83 kg)   SpO2 100%   BMI 30.45 kg/m  General:   Alert,  pleasant and cooperative in NAD Head:  Normocephalic and atraumatic. Neck:  Supple; Lungs:  Clear throughout to auscultation.    Heart:  Regular rate and rhythm. Abdomen:  Soft, nontender and nondistended. Normal bowel sounds, without guarding, and without rebound.   Neurologic:  Alert and  oriented x4;  grossly normal neurologically.  Impression/Plan:      DYSPEPSIA/UPPER ABDOMINAL PAIN.  PLAN:  EGD/POSSIBLE DIL TODAY. DISCUSSED PROCEDURE, BENEFITS, & RISKS: < 1% chance of medication reaction, bleeding, OR perforation.

## 2017-05-17 NOTE — Op Note (Signed)
[facility] Patient Name: Laura [name] Procedure Date: 05/17/2017 7:37 AM MRN: [id number] Date of Birth: Jun 06, 1951 Attending MD: Jonette Eva , MD CSN: [id number] Age: 66 Admit Type: Outpatient Procedure:                Upper GI endoscopy WITH COLD FORCEPS BIOPSY. Indications:              Epigastric abdominal pain, Abdominal pain in the                            right upper quadrant, Abdominal pain in the left                            upper quadrant, Dyspepsia. PMHx: CONSTIPATION,                            NORMOCYTIC ANEMIA SINCE 2009. SINCE 2007 PT HAS HAD                            3 EGDs AND 3 TCSs. Providers:                Jonette Eva, MD, Toniann Fail RN, RN, Nena Polio, RN Referring MD:             Milus Mallick. Simpson MD, MD Medicines:                Promethazine 12.5 mg IV, Meperidine 100 mg IV,                            Midazolam 4 mg IV Complications:            No immediate complications. Estimated Blood Loss:     Estimated blood loss was minimal. Procedure:                Pre-Anesthesia Assessment:                           - Prior to the procedure, a History and Physical                            was performed, and patient medications and                            allergies were reviewed. The patient's tolerance of                            previous anesthesia was also reviewed. The risks                            and benefits of the procedure and the sedation                            options and risks were discussed with the patient.  All questions were answered, and informed consent                            was obtained. Prior Anticoagulants: The patient has                            taken aspirin, last dose was 1 day prior to                            procedure. ASA Grade Assessment: II - A patient                            with mild systemic disease. After reviewing the           risks and benefits, the patient was deemed in                            satisfactory condition to undergo the procedure.                            After obtaining informed consent, the endoscope was                            passed under direct vision. Throughout the                            procedure, the patient's blood pressure, pulse, and                            oxygen saturations were monitored continuously. The                            [id number] ([id number]) scope was introduced through the                            mouth, and advanced to the second part of duodenum.                            The upper GI endoscopy was accomplished without                            difficulty. The patient tolerated the procedure                            well. Scope In: 9:08:46 AM Scope Out: 9:17:20 AM Total Procedure Duration: 0 hours 8 minutes 34 seconds  Findings:      The examined esophagus was normal.      Multiple diminutive sessile polyps were found in the cardia and in the       gastric fundus. This was biopsied with a cold forceps for histology.      The examined duodenum was normal. Biopsies for histology were taken with       a cold forceps for evaluation of celiac disease. Impression:               -  NO SOURCE FOR NORMOCYTIC ANEMIA IDENTIFIED                           - Multiple gastric polyps.                           - UPPER ABDOMINAL PAIN MOST LIKELY DUE TO                            CONSTIPATION/GERD Moderate Sedation:      Moderate (conscious) sedation was administered by the endoscopy nurse       and supervised by the endoscopist. The following parameters were       monitored: oxygen saturation, heart rate, blood pressure, and response       to care. Total physician intraservice time was 15 minutes. Recommendation:           - Await pathology results.                           - High fiber diet and low fat diet. DRINK WATER.                            LOSE TEN  POUNDS.                           - Continue present medications. STOP IRON. CONTINUE                            LINZESS. REDUCE DOSE TO 72 MCG DAILY 30 MINS PRIOR                            TO OR WITH BREAKFAST. SENNA IF NEEDED.                           - Return to my office in 4 months.                           - Patient has a contact number available for                            emergencies. The signs and symptoms of potential                            delayed complications were discussed with the                            patient. Return to normal activities tomorrow.                            Written discharge instructions were provided to the                            patient. Procedure Code(s):        --- Professional ---  [id number], Esophagogastroduodenoscopy, flexible,                            transoral; with biopsy, single or multiple                           99152, Moderate sedation services provided by the                            same physician or other qualified health care                            professional performing the diagnostic or                            therapeutic service that the sedation supports,                            requiring the presence of an independent trained                            observer to assist in the monitoring of the                            patient's level of consciousness and physiological                            status; initial 15 minutes of intraservice time,                            patient age 56 years or older Diagnosis Code(s):        --- Professional ---                           K31.7, Polyp of stomach and duodenum                           R10.13, Epigastric pain                           R10.11, Right upper quadrant pain                           R10.12, Left upper quadrant pain CPT copyright 2016 American Medical Association. All rights reserved. The codes documented in this  report are preliminary and upon coder review may  be revised to meet current compliance requirements. Jonette EvaSandi Stephanos Fan, MD Jonette EvaSandi Shana Younge, MD 05/17/2017 9:42:22 AM This report has been signed electronically. Number of Addenda: 0

## 2017-05-18 ENCOUNTER — Ambulatory Visit
Admission: RE | Admit: 2017-05-18 | Discharge: 2017-05-18 | Disposition: A | Discharge status: Home / Self Care | Payer: Medicare Other | Source: Ambulatory Visit | Attending: Orthopedic Surgery | Admitting: Orthopedic Surgery

## 2017-05-18 ENCOUNTER — Telehealth: Payer: Self-pay

## 2017-05-18 DIAGNOSIS — M1712 Unilateral primary osteoarthritis, left knee: Secondary | ICD-10-CM

## 2017-05-18 DIAGNOSIS — Z01818 Encounter for other preprocedural examination: Secondary | ICD-10-CM | POA: Diagnosis not present

## 2017-05-18 LAB — FOLATE RBC
FOLATE, RBC: 1126 ng/mL (ref >498)
Folate, Hemolysate: 360.2 ng/mL
HEMATOCRIT: 32 % — AB (ref 34.0–46.6)

## 2017-05-18 IMAGING — CT CT ANKLE*L* W/O CM
2 of 5 series · 5 of 14 positions shown, 6 images · non-contrast
Comparison: None.

CLINICAL DATA: Preop left knee arthroplasty.

EXAM:
CT OF THE LEFT HIP WITHOUT CONTRAST
CT OF THE LEFT KNEE WITHOUT CONTRAST
CT OF THE LEFT ANKLE WITHOUT CONTRAST
TECHNIQUE: Multidetector CT imaging of the left hip, knee and ankle was
performed according to the standard protocol. Multiplanar CT image
reconstructions of the left knee were also generated.

[Series 300: sagittal bone · sagittal · 0.50mm/px · 3 of 153 slices shown, 4 images]
[im 39/153  soft-tissue]
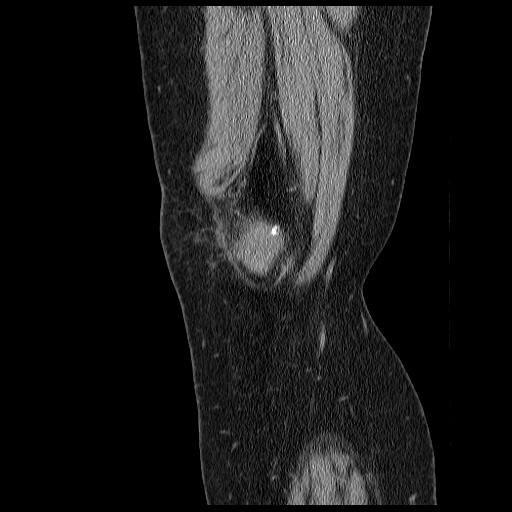
[im 39/153  bone]
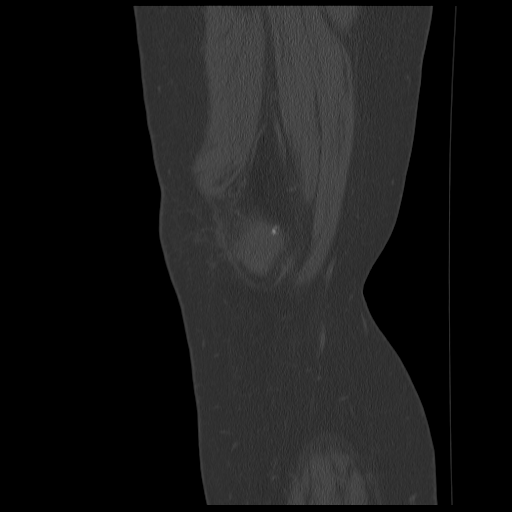
[im 77/153  bone]
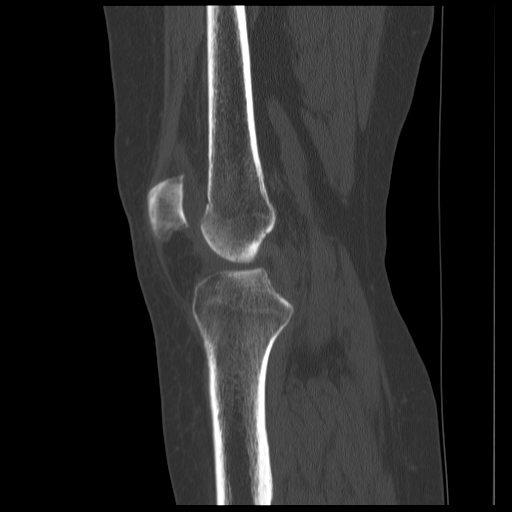
[im 115/153  bone]
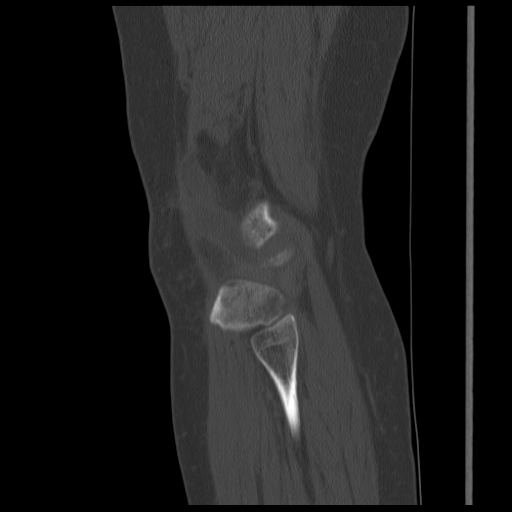

[Series 301: coronal bone · coronal · 0.50mm/px · 2 of 133 slices shown]
[im 45/133  bone]
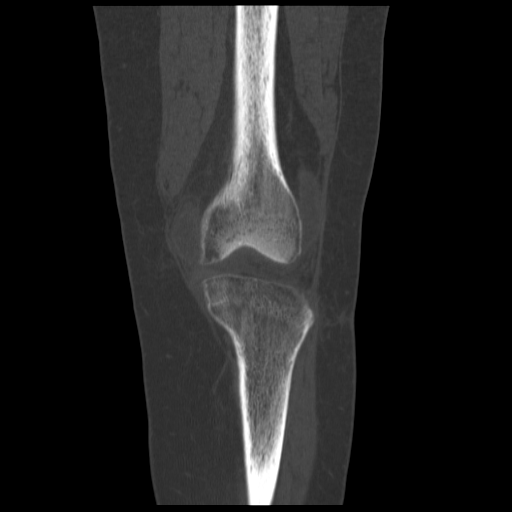
[im 89/133  bone]
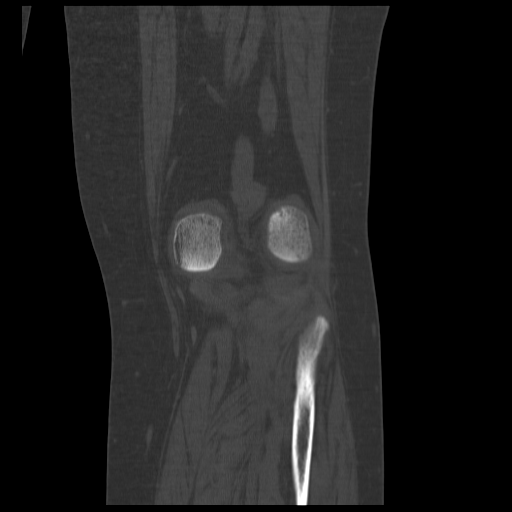

[5 of 14 positions shown; findings below may reference images not displayed]

FINDINGS: Bones/Joint/Cartilage

The hip demonstrates no fracture or dislocation. There is no lytic
or blastic lesion.

The knee demonstrates no fracture or dislocation. There is no lytic
or blastic lesion. There is moderate medial femorotibial compartment
joint space narrowing with subchondral sclerosis. The lateral
femorotibial compartment joint space is maintained. The
patellofemoral compartment joint space is maintained. There is a
moderate joint effusion.

The ankle demonstrates no fracture or dislocation. There is no lytic
or blastic lesion.

Ligaments

Ligaments are suboptimally evaluated by CT.

Muscles and Tendons
The muscles are normal. There is no muscle atrophy. The quadriceps
tendon and patellar tendon are intact.

Soft tissue
There is no fluid collection or hematoma. There is no soft tissue
mass.
IMPRESSION: 1. Moderate osteoarthritis of the medial femorotibial compartment of
the left knee.

## 2017-05-18 NOTE — Telephone Encounter (Signed)
PT came by the office and said she is having difficulty getting the Linzess 72 mcg. Darlina RumpfMartina told her to have pharmacy fax over the information needed for PA. I gave her #8 of the Linzess 72 mcg.

## 2017-05-18 NOTE — Telephone Encounter (Signed)
Pt came by by the office and said the pharmacy did not know what to do. I called [facility] and spoke to [place]. She said the problem is the pt has not met her deductible. So they applied $36.00 to deductible, but they then tack that on to her co pay of $45.00 and her payment would be $81.00. Manuela Schwartz said it does not seem fair but a lot of the drug companies are doing it.

## 2017-05-18 NOTE — Telephone Encounter (Signed)
REVIEWED. AGREE. NO ADDITIONAL RECOMMENDATIONS. 

## 2017-05-19 ENCOUNTER — Encounter (HOSPITAL_COMMUNITY): Payer: Self-pay | Admitting: Gastroenterology

## 2017-05-19 NOTE — Telephone Encounter (Signed)
REVIEWED. PT CAN PICK UP SAMPLES #12 AND SEE IF A CO-PAY ASSISTANCE CARD IS AVAILABLE.

## 2017-05-19 NOTE — Telephone Encounter (Signed)
LMOM to call. She has been given # 8 Linzess 72 mcg ( that was all we had).  Mailing the coupon to her.

## 2017-05-22 NOTE — Telephone Encounter (Addendum)
Please call pt. HER stomach Bx shows gastritis.  HER SMALL BOWEL BIOPSIES ARE NORMAL. NO SOURCE FOR HER LOW BLOOD COUNT WAS IDENTIFIED. HER B12 AND RBC FOLATE LEVELS ARE NORMAL. COMPLETE GIVENS CAPSULE STUDY.  FOLLOW UP IN 4 MOS E30 CONSTIPATION/ANEMIA.

## 2017-05-22 NOTE — Telephone Encounter (Signed)
PLEASE CALL PT. If she has constipation she can have abdominal pain.

## 2017-05-22 NOTE — Telephone Encounter (Signed)
Pt is aware.  

## 2017-05-22 NOTE — Telephone Encounter (Signed)
Pt came by the office and wanted to let Tyler AasDoris know that she had a bm yesterday and it was soft but she had to strain, and now she is having a slight burning in her RUQ about 2 inches from her navel. She said it doesn't hurt, it just stings and burns and it started yesterday after the bm she had. No blood in stool. No fever.

## 2017-05-23 ENCOUNTER — Other Ambulatory Visit: Payer: Self-pay

## 2017-05-23 DIAGNOSIS — D649 Anemia, unspecified: Secondary | ICD-10-CM

## 2017-05-23 NOTE — Telephone Encounter (Signed)
Pt is aware of results. 

## 2017-05-23 NOTE — Telephone Encounter (Signed)
PATIENT SCHEDULED  °

## 2017-05-23 NOTE — Telephone Encounter (Signed)
Pt is set up for GIVENS on 05/30/17 @ 7:00 am. She is aware

## 2017-05-23 NOTE — Telephone Encounter (Signed)
NO PA is needed for GIVENS  DECISION ID# [id number]

## 2017-05-25 ENCOUNTER — Telehealth: Payer: Self-pay

## 2017-05-25 NOTE — Telephone Encounter (Signed)
PLEASE CALL PT. She should hold linzess until she has formed stool. Then OPEN LINZESS CAPSULE. PLACE GRANULES IN 4 TEASPOONS OF WATER. STIR IT FOR 30 SECONDS. TAKE 3 TSP OF THE WATER DAILY. SHE DOES NOT NEED TO TAKE THE GRANULES THE MEDICINE IS IN THE WATER. IT MAY CAUSE EXPLOSIVE DIARRHEA.

## 2017-05-25 NOTE — Telephone Encounter (Signed)
PT is aware and she will come by the office get print out so she can remember how to do.  Copy at front desk.

## 2017-05-25 NOTE — Telephone Encounter (Signed)
Pt came by the office and said she has been taking the Linzess 72 mcg daily and doing well. But today when she urinates she also has water coming from her rectum and she cannot stop it It is clear. She had a good BM yesterday. Please advise!

## 2017-05-30 ENCOUNTER — Encounter (HOSPITAL_COMMUNITY)
Admission: RE | Disposition: A | Discharge status: Home / Self Care | Payer: Self-pay | Source: Ambulatory Visit | Attending: Gastroenterology

## 2017-05-30 ENCOUNTER — Ambulatory Visit (HOSPITAL_COMMUNITY)
Admission: RE | Admit: 2017-05-30 | Discharge: 2017-05-30 | Disposition: A | Discharge status: Home / Self Care | Payer: Medicare Other | Source: Ambulatory Visit | Attending: Gastroenterology | Admitting: Gastroenterology

## 2017-05-30 ENCOUNTER — Encounter (HOSPITAL_COMMUNITY): Payer: Self-pay | Admitting: Gastroenterology

## 2017-05-30 DIAGNOSIS — K529 Noninfective gastroenteritis and colitis, unspecified: Secondary | ICD-10-CM | POA: Diagnosis not present

## 2017-05-30 DIAGNOSIS — K317 Polyp of stomach and duodenum: Secondary | ICD-10-CM | POA: Insufficient documentation

## 2017-05-30 DIAGNOSIS — K5900 Constipation, unspecified: Secondary | ICD-10-CM | POA: Insufficient documentation

## 2017-05-30 DIAGNOSIS — D649 Anemia, unspecified: Secondary | ICD-10-CM

## 2017-05-30 HISTORY — PX: GIVENS CAPSULE STUDY: SHX5432

## 2017-05-30 HISTORY — DX: Anemia, unspecified: D64.9

## 2017-05-30 SURGERY — IMAGING PROCEDURE, GI TRACT, INTRALUMINAL, VIA CAPSULE

## 2017-05-30 MED ORDER — SIMETHICONE 40 MG/0.6ML PO SUSP
ORAL | Status: AC
  Filled 2017-05-30: qty 0.6

## 2017-06-03 ENCOUNTER — Telehealth: Payer: Self-pay | Admitting: Gastroenterology

## 2017-06-03 ENCOUNTER — Encounter (HOSPITAL_COMMUNITY): Payer: Self-pay | Admitting: Gastroenterology

## 2017-06-03 NOTE — Procedures (Signed)
  INDICATION: NORMOCYTIC ANEMIA Hb 9.9-11.1 SINCE JUL 2009. MOST RECENT CBC JUL 2018: Hb 10.4 MCV 84.9 PLT 306 APR 2018: Cr 0.92. 2011: EGD-GASTRITIS. 2012: TCS-IH. 2016: TCS MILD SIGMOID COLITIS/IH. JUL 2018: EGD-FG POLYPS, NL DUO Bx.  PATIENT DATA: WEIGHT: 154 LBS   WAIST: 34 IN   HEIGHT: 65 IN  GASTRIC PASSAGE TIME: 46 m, SB PASSAGE TIME: 2H 6638m  RESULTS: LIMITED views of gastric mucosa due to retained contents. OCCASIONAL GASTRIC EROSION.  No ULCERS, masses or AVMs seen IN SMALL BOWEL.Marland Kitchen.  LIMITED VIEWS OF THE COLON DUE TO RETAINED CONTENTS. No old blood or fresh blood in the stomach, small bowel, or colon.  DIAGNOSIS: MILD GASTRITIS-NO SOURCE FOR HER ANEMIA WAS IDENTIFIED.  Plan: 1. REPEAT CBC/FERRITIN YEARLY. 2. OPV IN NO 2018 E30 CONSTIPATION A/NEMIA

## 2017-06-03 NOTE — Telephone Encounter (Signed)
PLEASE CALL PT. HER BLOOD COUNT HAS NOT BEEN NORMAL SINCE 2009. HOWEVER HER IRON STORES ARE NORMAL. SHE NEEDS A CBC/FERRITIN ONCE A YEAR. NO SOURCE FOR HER ANEMIA WAS IDENTIFIED. SHE SHOULD SEE HEMATOLOGY TO DISCUSS HER CHRONIC ANEMIA. NEXT OPV IN NOV 2018 E30 CONSTIPATION/ANEMIA.

## 2017-06-05 ENCOUNTER — Other Ambulatory Visit: Payer: Self-pay

## 2017-06-05 DIAGNOSIS — D649 Anemia, unspecified: Secondary | ICD-10-CM

## 2017-06-05 NOTE — Telephone Encounter (Signed)
Referral sent to hematology via Epic. 

## 2017-06-05 NOTE — Telephone Encounter (Signed)
Pt is aware. She said she has not seen Hematologist, but Ok to refer to one.

## 2017-06-05 NOTE — Telephone Encounter (Signed)
OV made °

## 2017-06-19 ENCOUNTER — Other Ambulatory Visit: Payer: Self-pay | Admitting: Family Medicine

## 2017-06-19 ENCOUNTER — Telehealth: Payer: Self-pay | Admitting: Gastroenterology

## 2017-06-19 NOTE — Telephone Encounter (Signed)
LMOM to call.

## 2017-06-19 NOTE — Telephone Encounter (Signed)
Seen [phone] 18 

## 2017-06-19 NOTE — Telephone Encounter (Signed)
Pt was calling to follow up on papers she had left with [name] about getting her medication and also asked if she could get samples of Linzess 72mg . [phone]

## 2017-06-19 NOTE — Telephone Encounter (Signed)
Pt is aware to come by the office for the samples and to get new paperwork to fill out for the PA for the Linzess. First paperwork was done incorrectly.

## 2017-06-21 ENCOUNTER — Ambulatory Visit (INDEPENDENT_AMBULATORY_CARE_PROVIDER_SITE_OTHER): Payer: Medicare Other | Admitting: Family Medicine

## 2017-06-21 ENCOUNTER — Other Ambulatory Visit: Payer: Self-pay

## 2017-06-21 ENCOUNTER — Ambulatory Visit (HOSPITAL_COMMUNITY)
Admission: RE | Admit: 2017-06-21 | Discharge: 2017-06-21 | Disposition: A | Discharge status: Home / Self Care | Payer: Medicare Other | Source: Ambulatory Visit | Attending: Family Medicine | Admitting: Family Medicine

## 2017-06-21 ENCOUNTER — Encounter: Payer: Self-pay | Admitting: Family Medicine

## 2017-06-21 VITALS — BP 140/84 | HR 72 | Resp 16 | Ht 65.0 in | Wt 178.1 lb

## 2017-06-21 DIAGNOSIS — Z01818 Encounter for other preprocedural examination: Secondary | ICD-10-CM

## 2017-06-21 DIAGNOSIS — Z23 Encounter for immunization: Secondary | ICD-10-CM | POA: Diagnosis not present

## 2017-06-21 DIAGNOSIS — I10 Essential (primary) hypertension: Secondary | ICD-10-CM | POA: Diagnosis not present

## 2017-06-21 DIAGNOSIS — I700 Atherosclerosis of aorta: Secondary | ICD-10-CM | POA: Insufficient documentation

## 2017-06-21 DIAGNOSIS — J410 Simple chronic bronchitis: Secondary | ICD-10-CM | POA: Diagnosis not present

## 2017-06-21 LAB — POCT URINALYSIS DIPSTICK
BILIRUBIN UA: NEGATIVE
GLUCOSE UA: NEGATIVE
Ketones, UA: NEGATIVE
NITRITE UA: NEGATIVE
Protein, UA: NEGATIVE
Spec Grav, UA: 1.02 (ref 1.010–1.025)
UROBILINOGEN UA: 0.2 U/dL
pH, UA: 6.5 (ref 5.0–8.0)

## 2017-06-21 IMAGING — DX DG CHEST 2V
2 series · 2 of 2 positions shown · non-contrast
Comparison: Chest x-ray of [DATE]

CLINICAL DATA: Preoperative examination prior to knee surgery.
History of hypertension, former smoker

EXAM:
CHEST  2 VIEW

[chest pa]
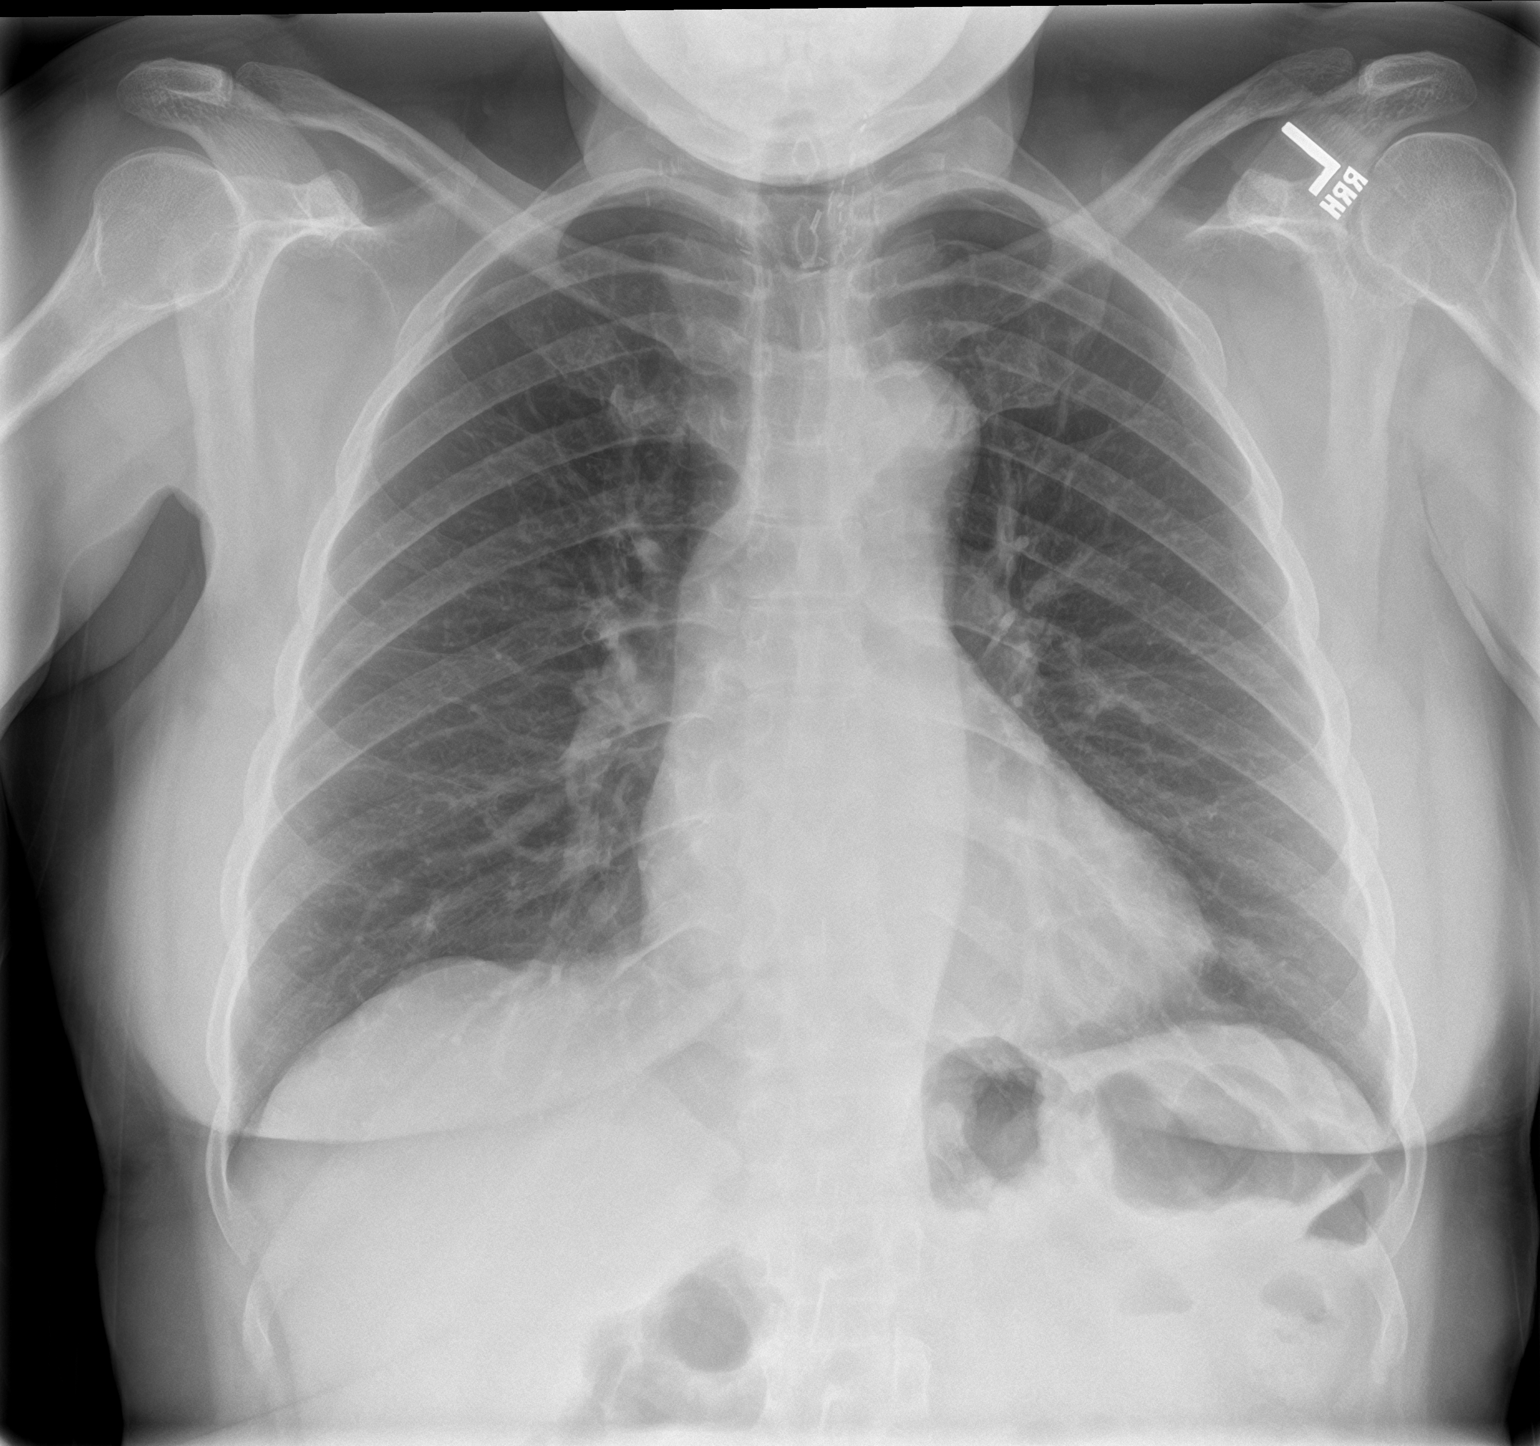

[chest lat]
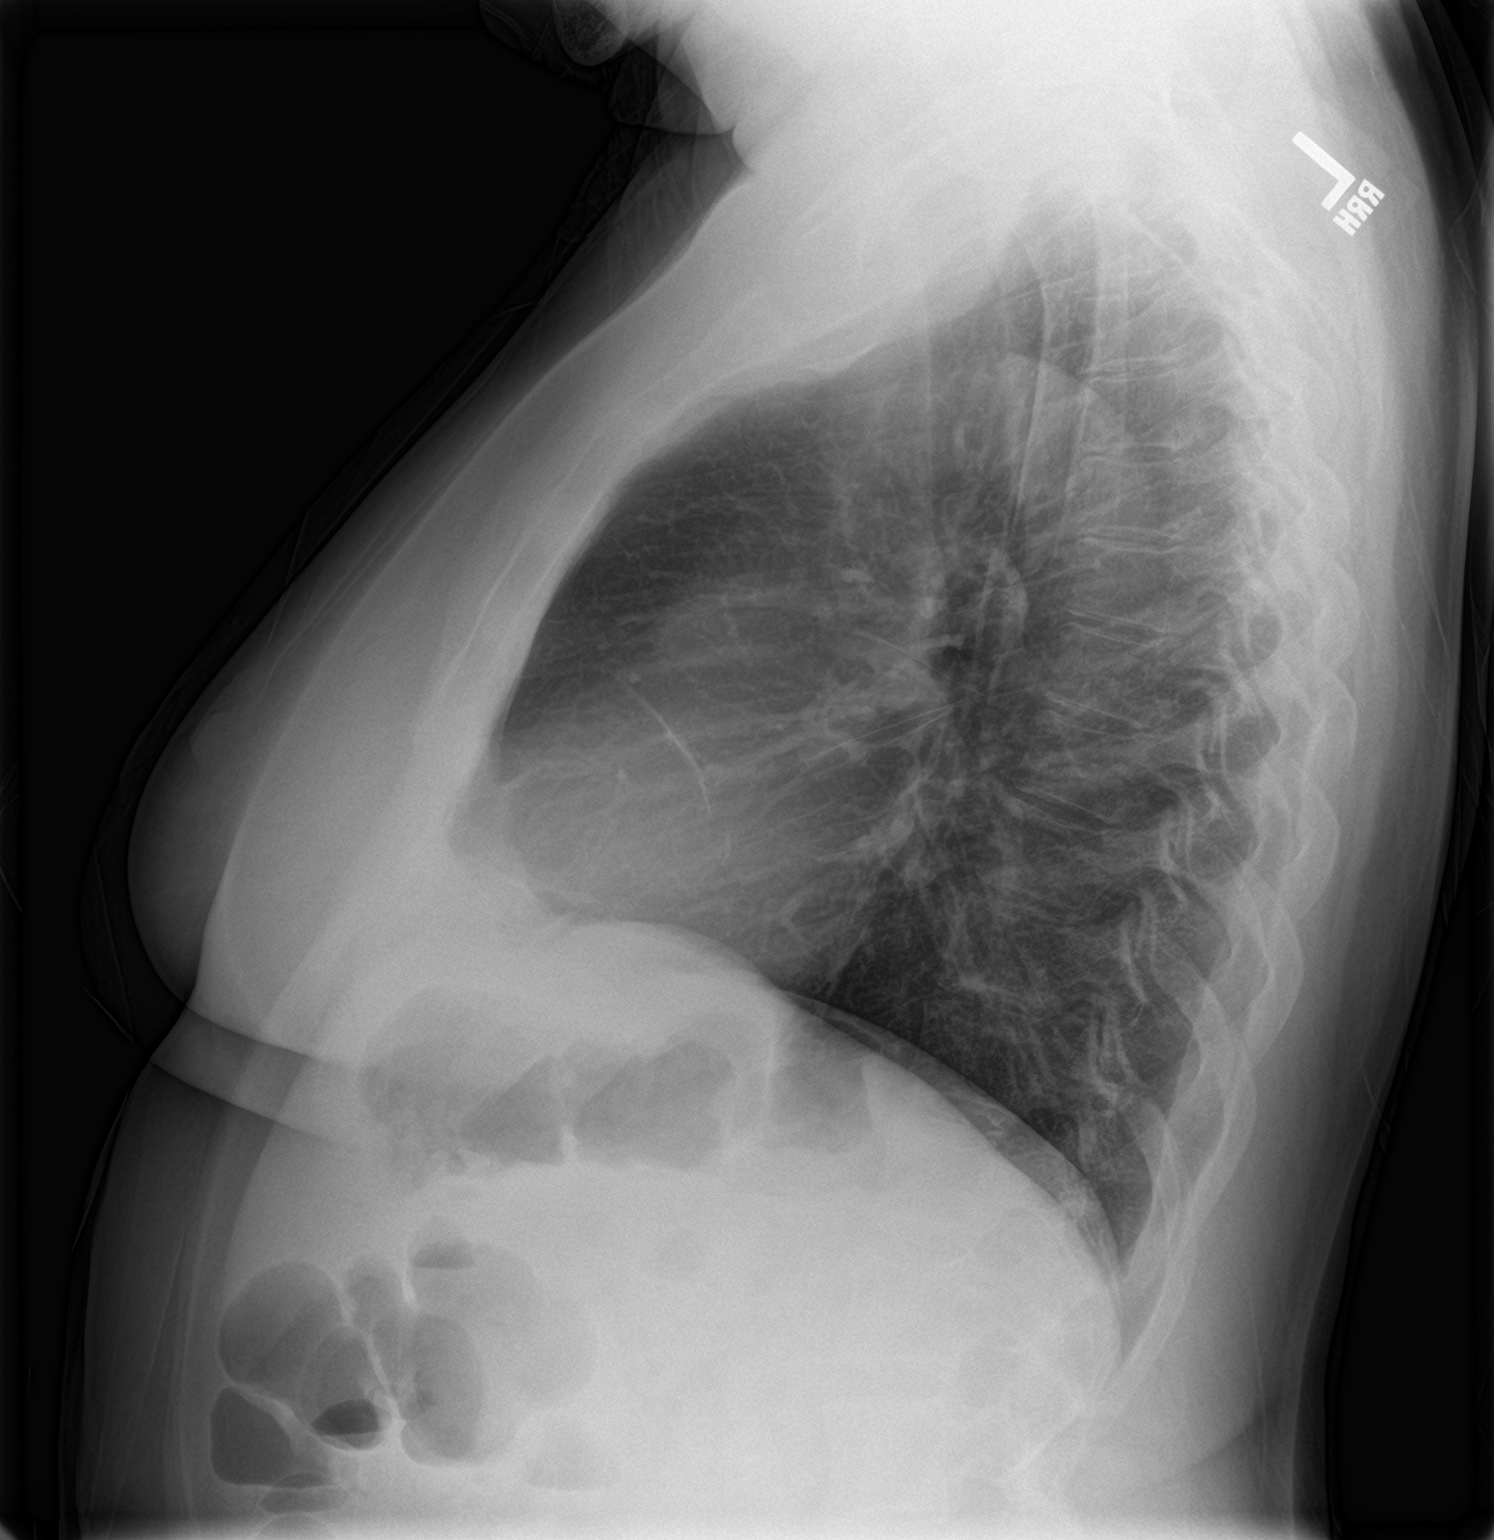

[2 of 2 positions shown; findings below may reference images not displayed]

FINDINGS: The lungs are well-expanded. There is no focal infiltrate. The
interstitial markings are coarse though stable. The heart and
pulmonary vascularity are normal. The mediastinum is normal in
width. There is no pleural effusion. There is calcification in the
wall of the thoracic aorta. The bony thorax exhibits no acute
abnormality.
IMPRESSION: Chronic bronchitic-smoking related changes, stable. There is no
acute cardiopulmonary abnormality.

Thoracic aortic atherosclerosis.

## 2017-06-21 MED ORDER — HYDROCHLOROTHIAZIDE 25 MG PO TABS: 25.0000 mg | ORAL_TABLET | Freq: Every day | ORAL | 1 refills | Status: DC

## 2017-06-21 MED ORDER — LOSARTAN POTASSIUM 50 MG PO TABS: 50.0000 mg | ORAL_TABLET | Freq: Every day | ORAL | 1 refills | Status: DC

## 2017-06-21 MED ORDER — ATORVASTATIN CALCIUM 20 MG PO TABS: 20.0000 mg | ORAL_TABLET | Freq: Every day | ORAL | 1 refills | Status: DC

## 2017-06-21 NOTE — Patient Instructions (Addendum)
Cancel October 15 exam  Please Reschedule physical exam to December 2nd week please  CXR today  Urine is being sent for cultiure  Flu vaccine today  Lipid, cmp , tSH and vit d today  All the best with knee surgery!  Thank you  for choosing [facility]. We consider it a privelige to serve you.  Delivering excellent health care in a caring and  compassionate way is our goal.  Partnering with you,  so that together we can achieve this goal is our strategy.

## 2017-06-22 ENCOUNTER — Ambulatory Visit: Payer: Medicare Other | Admitting: Gastroenterology

## 2017-06-22 ENCOUNTER — Other Ambulatory Visit: Payer: Self-pay | Admitting: Family Medicine

## 2017-06-22 LAB — COMPREHENSIVE METABOLIC PANEL
A/G RATIO: 1.6 (ref 1.2–2.2)
ALT: 20 IU/L (ref 0–32)
AST: 24 IU/L (ref 0–40)
Albumin: 4.5 g/dL (ref 3.6–4.8)
Alkaline Phosphatase: 86 IU/L (ref 39–117)
BILIRUBIN TOTAL: 0.5 mg/dL (ref 0.0–1.2)
BUN/Creatinine Ratio: 11 — ABNORMAL LOW (ref 12–28)
BUN: 10 mg/dL (ref 8–27)
CHLORIDE: 101 mmol/L (ref 96–106)
CO2: 26 mmol/L (ref 20–29)
Calcium: 10.1 mg/dL (ref 8.7–10.3)
Creatinine, Ser: 0.93 mg/dL (ref 0.57–1.00)
GFR calc non Af Amer: 64 mL/min/{1.73_m2} (ref >59)
GFR, EST AFRICAN AMERICAN: 74 mL/min/{1.73_m2} (ref >59)
Globulin, Total: 2.8 g/dL (ref 1.5–4.5)
Glucose: 97 mg/dL (ref 65–99)
POTASSIUM: 4 mmol/L (ref 3.5–5.2)
SODIUM: 143 mmol/L (ref 134–144)
Total Protein: 7.3 g/dL (ref 6.0–8.5)

## 2017-06-22 LAB — LIPID PANEL
Chol/HDL Ratio: 4 ratio (ref 0.0–4.4)
Cholesterol, Total: 185 mg/dL (ref 100–199)
HDL: 46 mg/dL (ref >39)
LDL CALC: 102 mg/dL — AB (ref 0–99)
Triglycerides: 183 mg/dL — ABNORMAL HIGH (ref 0–149)
VLDL CHOLESTEROL CAL: 37 mg/dL (ref 5–40)

## 2017-06-22 LAB — URINE CULTURE: Organism ID, Bacteria: NO GROWTH

## 2017-06-22 LAB — VITAMIN D 25 HYDROXY (VIT D DEFICIENCY, FRACTURES): VIT D 25 HYDROXY: 33.6 ng/mL (ref 30.0–100.0)

## 2017-06-22 LAB — TSH: TSH: 0.045 u[IU]/mL — AB (ref 0.450–4.500)

## 2017-06-23 DIAGNOSIS — Z01818 Encounter for other preprocedural examination: Secondary | ICD-10-CM | POA: Insufficient documentation

## 2017-06-23 NOTE — Assessment & Plan Note (Signed)
Pt scheduled for orthopedic surgery 08/09/2017. She is asymptomatic Exam is normal. Updated lab data , as well as CXR and EKG are within normal She is cleared from a medical standpoint for proposed left unicompartmental arthroplasty

## 2017-06-23 NOTE — Progress Notes (Signed)
   Laura [name]     MRN: [id number]      DOB: Aug 22, 1951   HPI Laura [name] is here for pre op evaluation left unicompartmental arthroplasty scheduled for 08/09/2017 She is asymptomatic apart for pain in the afected joint  ROS See HPI  Denies recent fever or chills. Denies sinus pressure, nasal congestion, ear pain or sore throat. Denies chest congestion, productive cough or wheezing. Denies chest pains, palpitations and leg swelling Denies abdominal pain, nausea, vomiting,diarrhea or constipation.   Denies dysuria, frequency, hesitancy or incontinence.  Denies headaches, seizures, numbness, or tingling. Denies depression, anxiety or insomnia. Denies skin break down or rash.   PE  BP 140/84   Pulse 72   Resp 16   Ht 5\' 5"  (1.651 m)   Wt 178 lb 1.9 oz (80.8 kg)   SpO2 98%   BMI 29.64 kg/m   Patient alert and oriented and in no cardiopulmonary distress.  HEENT: No facial asymmetry, EOMI,   oropharynx pink and moist.  Neck supple no JVD, no mass.  Chest: Clear to auscultation bilaterally.  CVS: S1, S2 no murmurs, no S3.Regular rate. EKG; NSR, no ischemia , no LVH ABD: Soft non tender.   Ext: No edema  MS: Adequate ROM spine, shoulders, hips and reduced in left knee which has swelling and deformity  Skin: Intact, no ulcerations or rash noted.  Psych: Good eye contact, normal affect. Memory intact not anxious or depressed appearing.  CNS: CN 2-12 intact, power,  normal throughout.no focal deficits noted.   Assessment & Plan Pre-op examination Pt scheduled for orthopedic surgery 08/09/2017. She is asymptomatic Exam is normal. Updated lab data , as well as CXR and EKG are within normal She is cleared from a medical standpoint for proposed left unicompartmental arthroplasty

## 2017-06-27 NOTE — Telephone Encounter (Signed)
Pt brought back paperwork and it has been completed and faxed to the patient assistance company.

## 2017-06-29 ENCOUNTER — Other Ambulatory Visit: Payer: Self-pay

## 2017-06-29 MED ORDER — LEVOTHYROXINE SODIUM 112 MCG PO TABS: ORAL_TABLET | ORAL | 0 refills | Status: DC

## 2017-06-29 MED ORDER — PANTOPRAZOLE SODIUM 40 MG PO TBEC
40.0000 mg | DELAYED_RELEASE_TABLET | Freq: Two times a day (BID) | ORAL | 1 refills | Status: DC
Start: 2017-06-29 — End: 2017-10-26

## 2017-07-07 ENCOUNTER — Encounter (HOSPITAL_COMMUNITY): Payer: Self-pay

## 2017-07-07 ENCOUNTER — Encounter (HOSPITAL_COMMUNITY): Payer: Medicare Other

## 2017-07-07 ENCOUNTER — Encounter (HOSPITAL_COMMUNITY): Payer: Medicare Other | Attending: Oncology | Admitting: Oncology

## 2017-07-07 VITALS — BP 149/96 | HR 65 | Temp 98.3°F | Resp 18 | Ht 64.75 in | Wt 178.7 lb

## 2017-07-07 DIAGNOSIS — D649 Anemia, unspecified: Secondary | ICD-10-CM

## 2017-07-07 DIAGNOSIS — Z87891 Personal history of nicotine dependence: Secondary | ICD-10-CM | POA: Diagnosis not present

## 2017-07-07 LAB — IRON AND TIBC
IRON: 60 ug/dL (ref 28–170)
SATURATION RATIOS: 23 % (ref 10.4–31.8)
TIBC: 265 ug/dL (ref 250–450)
UIBC: 205 ug/dL

## 2017-07-07 LAB — COMPREHENSIVE METABOLIC PANEL
ALBUMIN: 4 g/dL (ref 3.5–5.0)
ALK PHOS: 64 U/L (ref 38–126)
ALT: 18 U/L (ref 14–54)
ANION GAP: 7 (ref 5–15)
AST: 19 U/L (ref 15–41)
BILIRUBIN TOTAL: 0.5 mg/dL (ref 0.3–1.2)
BUN: 13 mg/dL (ref 6–20)
CALCIUM: 9.6 mg/dL (ref 8.9–10.3)
CO2: 30 mmol/L (ref 22–32)
CREATININE: 0.87 mg/dL (ref 0.44–1.00)
Chloride: 101 mmol/L (ref 101–111)
Glucose, Bld: 100 mg/dL — ABNORMAL HIGH (ref 65–99)
Potassium: 3.1 mmol/L — ABNORMAL LOW (ref 3.5–5.1)
Sodium: 138 mmol/L (ref 135–145)
TOTAL PROTEIN: 7.4 g/dL (ref 6.5–8.1)

## 2017-07-07 LAB — VITAMIN B12: VITAMIN B 12: 1163 pg/mL — AB (ref 180–914)

## 2017-07-07 LAB — RETICULOCYTES
RBC.: 4 MIL/uL (ref 3.87–5.11)
RETIC COUNT ABSOLUTE: 68 10*3/uL (ref 19.0–186.0)
Retic Ct Pct: 1.7 % (ref 0.4–3.1)

## 2017-07-07 LAB — FERRITIN: Ferritin: 105 ng/mL (ref 11–307)

## 2017-07-07 LAB — FOLATE: Folate: 34 ng/mL (ref >5.9)

## 2017-07-07 NOTE — Progress Notes (Signed)
[facility] Initial Visit:  Patient Care Team: Fayrene Helper, MD as PCP - [facility], Marga Melnick, MD (Gastroenterology) Herminio Commons, MD as Attending Physician (Cardiology) Netta Cedars, MD as Consulting Physician (Orthopedic Surgery) Cassandria Anger, MD as Consulting Physician (Endocrinology)  CHIEF COMPLAINTS/PURPOSE OF CONSULTATION:  Anemia  HISTORY OF PRESENTING ILLNESS: Laura [name] 66 y.o. female presents today for evaluation of anemia. Patient has a history of chronic anemia, with hemoglobin in the 10-11 g/dL range, dating as far back as 2009. Patient has been following with Dr. Oneida Alar and has a full GI workup including capsule endoscopy and EGD/colonoscopy without any evidence of bleeding. The only GI finding was mild gastritis. Patient denies any melena, hematochezia, hematuria. Overall she states she feels well she denies any fatigue. She denies any chest pain, shortness breath, abdominal pain, headaches, blurry vision, focal weakness. Her most recent bloodwork from 05/17/17 demonstrated WBC 4.2K, hematoma 10.4 g/dL, hematocrit 32.1%, MCV 84.9, platelet count 306K. Ferritin 102, folate 360, and B12 1393. She denies any history of any hemoglobinopathies in her family to her knowledge.  Review of Systems - Oncology ROS as per HPI otherwise 12 point ROS is negative.  MEDICAL HISTORY: Past Medical History:  Diagnosis Date  . Anxiety   . Cystitis   . Depression   . Gastritis   . GERD (gastroesophageal reflux disease)   . Hashimoto's thyroiditis    Hx   . Hyperlipidemia   . Hypertension   . Hypothyroidism   . Normocytic anemia 2009 Hb 9.9-11.1  . Osteopenia   . Shoulder pain   . Urinary incontinence     SURGICAL HISTORY: Past Surgical History:  Procedure Laterality Date  . Bilateral tubal ligation    . BREAST EXCISIONAL BIOPSY  2010   Excisional biopsy of benign left breast mass -lopoma   . BREAST SURGERY     left nreast  biopsy  . COLONOSCOPY  08/26/2011   SLF: 1. Internal hemorrhoids  . COLONOSCOPY N/A 10/06/2015   AVW:PVXY fissure or internal hemorrhoids/mild sigmoid colitis. benign colonic path   . colonscopy  2005   Dr. Tamala Julian  . ESOPHAGOGASTRODUODENOSCOPY  12/2009   chronic gastritis  . ESOPHAGOGASTRODUODENOSCOPY N/A 05/17/2017   Procedure: ESOPHAGOGASTRODUODENOSCOPY (EGD);  Surgeon: Danie Binder, MD;  [facility]: [facility];  Service: Endoscopy;  Laterality: N/A;  830   . GIVENS CAPSULE STUDY N/A 05/30/2017   Procedure: GIVENS CAPSULE STUDY;  Surgeon: Danie Binder, MD;  Location: [facility];  Service: Endoscopy;  Laterality: N/A;  700  . left knee surgery Left March 1,2017  . Resection of left lobe of thyroid    . right carpal tunnel release    . rt. knee athroscopy  2004  . SAVORY DILATION N/A 05/17/2017   Procedure: SAVORY DILATION;  Surgeon: Danie Binder, MD;  Location: [facility];  Service: Endoscopy;  Laterality: N/A;  . TOTAL ABDOMINAL HYSTERECTOMY  1994  . UMBILICAL HERNIA REPAIR    . Urethral dilation for stenosis  2009    SOCIAL HISTORY: Social History   Social History  . Marital status: Married    Spouse name: N/A  . Number of children: 3  . Years of education: N/A   Occupational History  . 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 Smoking status: Former Smoker    Packs/day: 0.50    Types: Cigarettes    Quit date: 08/25/1972  . Smokeless  tobacco: Never Used  . Alcohol use No  . Drug use: No  . Sexual activity: Yes     Comment: married   Other Topics Concern  . Not on file   Social History Narrative  . No narrative on file    FAMILY HISTORY Family History  Problem Relation Age of Onset  . Pneumonia Mother   . Colon cancer Neg Hx   . Anesthesia problems Neg Hx   . Hypotension Neg Hx   . Malignant hyperthermia Neg Hx   . Pseudochol deficiency Neg Hx     ALLERGIES:  is allergic to dilaudid [hydromorphone hcl];  levaquin [levofloxacin]; nsaids; and promethazine.  MEDICATIONS:  Current Outpatient Prescriptions  Medication Sig Dispense Refill  . acetaminophen (TYLENOL) 500 MG tablet Take 500-1,000 mg by mouth every 6 (six) hours as needed (for pain.).    Marland Kitchen atorvastatin (LIPITOR) 20 MG tablet Take 1 tablet (20 mg total) by mouth daily. 90 tablet 1  . azelastine (ASTELIN) 0.1 % nasal spray Place 2 sprays into both nostrils 2 (two) times daily. Use in each nostril as directed 30 mL 12  . citalopram (CELEXA) 10 MG tablet TAKE 1 TABLET(10 MG) BY MOUTH DAILY 90 tablet 0  . diclofenac sodium (VOLTAREN) 1 % GEL Apply 2-4 g topically 4 (four) times daily as needed. For knee pain.  0  . gabapentin (NEURONTIN) 100 MG capsule Take 1 capsule (100 mg total) by mouth at bedtime. (Patient taking differently: Take 100 mg by mouth daily as needed (for restless legs). ) 30 capsule 4  . hydrochlorothiazide (HYDRODIURIL) 25 MG tablet Take 1 tablet (25 mg total) by mouth daily. 90 tablet 1  . levothyroxine (SYNTHROID, LEVOTHROID) 112 MCG tablet TAKE 1 TABLET(112 MCG) BY MOUTH DAILY BEFORE BREAKFAST 90 tablet 0  . linaclotide (LINZESS) 72 MCG capsule Take 1 capsule (72 mcg total) by mouth daily before breakfast. 30 capsule 11  . loratadine (CLARITIN) 10 MG tablet Take 1 tablet (10 mg total) by mouth daily. 30 tablet 3  . losartan (COZAAR) 50 MG tablet Take 1 tablet (50 mg total) by mouth daily. 90 tablet 1  . Multiple Vitamin (MULTIVITAMIN) tablet Take 1 tablet by mouth daily after breakfast. Centrum Silver    . oxybutynin (DITROPAN) 5 MG/5ML syrup Take 5 mLs (5 mg total) by mouth 2 (two) times daily. (Patient taking differently: Take 2.5 mg by mouth daily. ) 300 mL 3  . pantoprazole (PROTONIX) 40 MG tablet Take 1 tablet (40 mg total) by mouth 2 (two) times daily. 180 tablet 1  . Polyethyl Glycol-Propyl Glycol (SYSTANE) 0.4-0.3 % SOLN Apply 1 drop to eye 3 (three) times daily as needed (for dry eyes.).    Marland Kitchen traMADol (ULTRAM) 50  MG tablet Take 50-100 mg by mouth every 6 (six) hours as needed. For knee pain.  0  . vitamin E 400 UNIT capsule Take 400 Units by mouth daily after breakfast.      No current facility-administered medications for this visit.     PHYSICAL EXAMINATION:   Vitals:   07/07/17 0818  BP: (!) 149/96  Pulse: 65  Resp: 18  Temp: 98.3 F (36.8 C)  SpO2: 100%    Filed Weights   07/07/17 0818  Weight: 178 lb 11.2 oz (81.1 kg)     Physical Exam Constitutional: Well-developed, well-nourished, and in no distress.   HENT:  Head: Normocephalic and atraumatic.  Mouth/Throat: No oropharyngeal exudate. Mucosa moist. Eyes: Pupils are equal, round, and reactive to light.  Conjunctivae are normal. No scleral icterus.  Neck: Normal range of motion. Neck supple. No JVD present.  Cardiovascular: Normal rate, regular rhythm and normal heart sounds.  Exam reveals no gallop and no friction rub.   No murmur heard. Pulmonary/Chest: Effort normal and breath sounds normal. No respiratory distress. No wheezes.No rales.  Abdominal: Soft. Bowel sounds are normal. No distension. There is no tenderness. There is no guarding.  Musculoskeletal: No edema or tenderness.  Lymphadenopathy:    No cervical or supraclavicular adenopathy.  Neurological: Alert and oriented to person, place, and time. No cranial nerve deficit.  Skin: Skin is warm and dry. No rash noted. No erythema. No pallor.  Psychiatric: Affect and judgment normal.    LABORATORY DATA: I have personally reviewed the data as listed:  Appointment on 07/07/2017  Component Date Value Ref Range Status  . Sodium 07/07/2017 138  135 - 145 mmol/L Final  . Potassium 07/07/2017 3.1* 3.5 - 5.1 mmol/L Final  . Chloride 07/07/2017 101  101 - 111 mmol/L Final  . CO2 07/07/2017 30  22 - 32 mmol/L Final  . Glucose, Bld 07/07/2017 100* 65 - 99 mg/dL Final  . BUN 07/07/2017 13  6 - 20 mg/dL Final  . Creatinine, Ser 07/07/2017 0.87  0.44 - 1.00 mg/dL Final  .  Calcium 07/07/2017 9.6  8.9 - 10.3 mg/dL Final  . Total Protein 07/07/2017 7.4  6.5 - 8.1 g/dL Final  . Albumin 07/07/2017 4.0  3.5 - 5.0 g/dL Final  . AST 07/07/2017 19  15 - 41 U/L Final  . ALT 07/07/2017 18  14 - 54 U/L Final  . Alkaline Phosphatase 07/07/2017 64  38 - 126 U/L Final  . Total Bilirubin 07/07/2017 0.5  0.3 - 1.2 mg/dL Final  . GFR calc non Af Amer 07/07/2017 >60  >60 mL/min Final  . GFR calc Af Amer 07/07/2017 >60  >60 mL/min Final   Comment: (NOTE) The eGFR has been calculated using the CKD EPI equation. This calculation has not been validated in all clinical situations. eGFR's persistently <60 mL/min signify possible Chronic Kidney Disease.   . Anion gap 07/07/2017 7  5 - 15 Final  . Retic Ct Pct 07/07/2017 1.7  0.4 - 3.1 % Final  . RBC. 07/07/2017 4.00  3.87 - 5.11 MIL/uL Final  . Retic Count, Absolute 07/07/2017 68.0  19.0 - 186.0 K/uL Final  Office Visit on 06/21/2017  Component Date Value Ref Range Status  . Cholesterol, Total 06/21/2017 185  100 - 199 mg/dL Final  . Triglycerides 06/21/2017 183* 0 - 149 mg/dL Final  . HDL 06/21/2017 46  >39 mg/dL Final  . VLDL Cholesterol Cal 06/21/2017 37  5 - 40 mg/dL Final  . LDL Calculated 06/21/2017 102* 0 - 99 mg/dL Final  . Chol/HDL Ratio 06/21/2017 4.0  0.0 - 4.4 ratio Final   Comment:                                   T. Chol/HDL Ratio                                             Men  Women  1/2 Avg.Risk  3.4    3.3                                   Avg.Risk  5.0    4.4                                2X Avg.Risk  9.6    7.1                                3X Avg.Risk 23.4   11.0   . TSH 06/21/2017 0.045* 0.450 - 4.500 uIU/mL Final  . Vit D, 25-Hydroxy 06/21/2017 33.6  30.0 - 100.0 ng/mL Final   Comment: Vitamin D deficiency has been defined by the [place] practice guideline as a level of serum 25-OH vitamin D less than 20 ng/mL (1,2). The  Endocrine Society went on to further define vitamin D insufficiency as a level between 21 and 29 ng/mL (2). 1. IOM (Institute of Medicine). 2010. Dietary reference    intakes for calcium and D. [place]: The    [employer]. 2. Holick MF, Binkley Cowlitz, Bischoff-Ferrari HA, et al.    Evaluation, treatment, and prevention of vitamin D    deficiency: an Endocrine Society clinical practice    guideline. JCEM. 2011 Jul; 96(7):1911-30.   Marland Kitchen Glucose 06/21/2017 97  65 - 99 mg/dL Final  . BUN 06/21/2017 10  8 - 27 mg/dL Final  . Creatinine, Ser 06/21/2017 0.93  0.57 - 1.00 mg/dL Final  . GFR calc non Af Amer 06/21/2017 64  >59 mL/min/1.73 Final  . GFR calc Af Amer 06/21/2017 74  >59 mL/min/1.73 Final  . BUN/Creatinine Ratio 06/21/2017 11* 12 - 28 Final  . Sodium 06/21/2017 143  134 - 144 mmol/L Final  . Potassium 06/21/2017 4.0  3.5 - 5.2 mmol/L Final  . Chloride 06/21/2017 101  96 - 106 mmol/L Final  . CO2 06/21/2017 26  20 - 29 mmol/L Final  . Calcium 06/21/2017 10.1  8.7 - 10.3 mg/dL Final  . Total Protein 06/21/2017 7.3  6.0 - 8.5 g/dL Final  . Albumin 06/21/2017 4.5  3.6 - 4.8 g/dL Final  . Globulin, Total 06/21/2017 2.8  1.5 - 4.5 g/dL Final  . Albumin/Globulin Ratio 06/21/2017 1.6  1.2 - 2.2 Final  . Bilirubin Total 06/21/2017 0.5  0.0 - 1.2 mg/dL Final  . Alkaline Phosphatase 06/21/2017 86  39 - 117 IU/L Final  . AST 06/21/2017 24  0 - 40 IU/L Final  . ALT 06/21/2017 20  0 - 32 IU/L Final  . Color, UA 06/21/2017 yellow   Final  . Clarity, UA 06/21/2017 clear   Final  . Glucose, UA 06/21/2017 neg   Final  . Bilirubin, UA 06/21/2017 neg   Final  . Ketones, UA 06/21/2017 neg   Final  . Spec Grav, UA 06/21/2017 1.020  1.010 - 1.025 Final  . Blood, UA 06/21/2017 trace intact   Final  . pH, UA 06/21/2017 6.5  5.0 - 8.0 Final  . Protein, UA 06/21/2017 neg   Final  . Urobilinogen, UA 06/21/2017 0.2  0.2 or 1.0 E.U./dL Final  . Nitrite, UA 06/21/2017 neg   Final  .  Leukocytes, UA 06/21/2017 Small (1+)* Negative Final  .  Organism ID, Bacteria 06/21/2017 NO GROWTH   Final    RADIOGRAPHIC STUDIES: I have personally reviewed the radiological images as listed and agree with the findings in the report  No results found.  ASSESSMENT: Chronic normocytic anemia dating back to 2009, hemoglobin in the 10-11 g/dL range.  Negative GI workup.  PLAN: Will perform a full anemia workup with labs as stated below.  RTC in 10 days to review labs and to discuss next plan of care.   Orders Placed This Encounter  Procedures  . CBC with Differential    Standing Status:   Future    Standing Expiration Date:   07/07/2018  . Comprehensive metabolic panel    Standing Status:   Future    Number of Occurrences:   1    Standing Expiration Date:   07/07/2018  . Erythropoietin    Standing Status:   Future    Number of Occurrences:   1    Standing Expiration Date:   07/07/2018  . Iron and TIBC    Standing Status:   Future    Number of Occurrences:   1    Standing Expiration Date:   07/07/2018  . Ferritin    Standing Status:   Future    Number of Occurrences:   1    Standing Expiration Date:   07/07/2018  . Vitamin B12    Standing Status:   Future    Number of Occurrences:   1    Standing Expiration Date:   07/07/2018  . Folate    Standing Status:   Future    Number of Occurrences:   1    Standing Expiration Date:   07/07/2018  . Hemoglobinopathy evaluation    Standing Status:   Future    Number of Occurrences:   1    Standing Expiration Date:   07/07/2018  . Reticulocytes    Standing Status:   Future    Number of Occurrences:   1    Standing Expiration Date:   07/07/2018  . ANA, IFA (with reflex)  . Multiple Myeloma Panel (SPEP&IFE w/QIG)    Standing Status:   Future    Number of Occurrences:   1    Standing Expiration Date:   07/07/2018    All questions were answered. The patient knows to call the clinic with any problems, questions or concerns.  This note was  electronically signed.    Twana First, MD  07/07/2017 2:43 PM

## 2017-07-07 NOTE — Patient Instructions (Signed)
[facility] at [facility] Discharge Instructions  RECOMMENDATIONS MADE BY THE CONSULTANT AND ANY TEST RESULTS WILL BE SENT TO YOUR REFERRING PHYSICIAN.  You saw Dr. Zhou today.  Thank you for choosing [facility] at [facility] to provide your oncology and hematology care.  To afford each patient quality time with our provider, please arrive at least 15 minutes before your scheduled appointment time.    If you have a lab appointment with the Cancer Center please come in thru the  Main Entrance and check in at the main information desk  You need to re-schedule your appointment should you arrive 10 or more minutes late.  We strive to give you quality time with our providers, and arriving late affects you and other patients whose appointments are after yours.  Also, if you no show three or more times for appointments you may be dismissed from the clinic at the providers discretion.     Again, thank you for choosing [facility].  Our hope is that these requests will decrease the amount of time that you wait before being seen by our physicians.       _____________________________________________________________  Should you have questions after your visit to [facility], please contact our office at [phone] between the hours of 8:30 a.m. and 4:30 p.m.  Voicemails left after 4:30 p.m. will not be returned until the following business day.  For prescription refill requests, have your pharmacy contact our office.       Resources For Cancer Patients and their Caregivers ? American Cancer Society: Can assist with transportation, wigs, general needs, runs Look Good Feel Better.        1-888-227-6333 ? Cancer Care: Provides financial assistance, online support groups, medication/co-pay assistance.  1-800-813-HOPE (4673) ? [facility] Assists [place] cancer patients and their families through  emotional , educational and financial support.  336-427-4357 ? [place] DSS Where to apply for food stamps, Medicaid and utility assistance. 336-342-1394 ? [facility]: Transportation to medical appointments. 336-347-2287 ? Social Security Administration: May apply for disability if have a Stage IV cancer. 336-342-7796 1-800-772-1213 ? [place] Aging, Disability and Transit Services: Assists with nutrition, care and transit needs. 336-349-2343  Cancer Center Support Programs: @[email or web address]@ > Cancer Support Group  2nd Tuesday of the month 1pm-2pm, Journey Room  > Creative Journey  3rd Tuesday of the month 1130am-1pm, Journey Room  > Look Good Feel Better  1st Wednesday of the month 10am-12 noon, Journey Room (Call American Cancer Society to register 1-800-395-5775)    

## 2017-07-08 LAB — ERYTHROPOIETIN: Erythropoietin: 10.6 m[IU]/mL (ref 2.6–18.5)

## 2017-07-10 LAB — HEMOGLOBINOPATHY EVALUATION
HGB A: 98.1 % (ref 96.4–98.8)
HGB VARIANT: 0 %
Hgb A2 Quant: 1.9 % (ref 1.8–3.2)
Hgb C: 0 %
Hgb F Quant: 0 % (ref 0.0–2.0)
Hgb S Quant: 0 %

## 2017-07-10 LAB — ANTINUCLEAR ANTIBODIES, IFA: ANA Ab, IFA: NEGATIVE

## 2017-07-11 LAB — MULTIPLE MYELOMA PANEL, SERUM
ALBUMIN SERPL ELPH-MCNC: 3.9 g/dL (ref 2.9–4.4)
ALBUMIN/GLOB SERPL: 1.2 (ref 0.7–1.7)
ALPHA 1: 0.2 g/dL (ref 0.0–0.4)
ALPHA2 GLOB SERPL ELPH-MCNC: 0.7 g/dL (ref 0.4–1.0)
B-Globulin SerPl Elph-Mcnc: 1 g/dL (ref 0.7–1.3)
GAMMA GLOB SERPL ELPH-MCNC: 1.5 g/dL (ref 0.4–1.8)
Globulin, Total: 3.4 g/dL (ref 2.2–3.9)
IGA: 221 mg/dL (ref 87–352)
IGG (IMMUNOGLOBIN G), SERUM: 1567 mg/dL (ref 700–1600)
IGM (IMMUNOGLOBULIN M), SRM: 117 mg/dL (ref 26–217)
TOTAL PROTEIN ELP: 7.3 g/dL (ref 6.0–8.5)

## 2017-07-19 ENCOUNTER — Encounter (HOSPITAL_BASED_OUTPATIENT_CLINIC_OR_DEPARTMENT_OTHER): Payer: Medicare Other | Admitting: Oncology

## 2017-07-19 ENCOUNTER — Encounter (HOSPITAL_COMMUNITY): Payer: Self-pay

## 2017-07-19 VITALS — BP 142/78 | HR 65 | Resp 16 | Ht 64.0 in | Wt 176.5 lb

## 2017-07-19 DIAGNOSIS — D649 Anemia, unspecified: Secondary | ICD-10-CM

## 2017-07-19 NOTE — Progress Notes (Signed)
[facility] Initial Visit:  Patient Care Team: Fayrene Helper, MD as PCP - General Fields, Marga Melnick, MD (Gastroenterology) Herminio Commons, MD as Attending Physician (Cardiology) Netta Cedars, MD as Consulting Physician (Orthopedic Surgery) Cassandria Anger, MD as Consulting Physician (Endocrinology)  CHIEF COMPLAINTS/PURPOSE OF CONSULTATION:  Anemia  HISTORY OF PRESENTING ILLNESS: Laura [name] 66 y.o. female presents today for evaluation of anemia. Patient has a history of chronic anemia, with hemoglobin in the 10-11 g/dL range, dating as far back as 2009. Patient has been following with Dr. Oneida Alar and has a full GI workup including capsule endoscopy and EGD/colonoscopy without any evidence of bleeding. The only GI finding was mild gastritis. Patient denies any melena, hematochezia, hematuria. Overall she states she feels well she denies any fatigue. She denies any chest pain, shortness breath, abdominal pain, headaches, blurry vision, focal weakness. Her most recent bloodwork from 05/17/17 demonstrated WBC 4.2K, hematoma 10.4 g/dL, hematocrit 32.1%, MCV 84.9, platelet count 306K. Ferritin 102, folate 360, and B12 1393. She denies any history of any hemoglobinopathies in her family to her knowledge.  INTERVAL HISTORY: Patient presents today for continued follow-up and review of her anemia workup lab results. She has no new complaints since last visit. Labs from 07/07/2017 demonstrated normal iron studies with a ferritin of 105,erythropoietin within normal limits, normal folate. Vitamin B12 elevated at 1162. Reticulocyte count within normal limits. Hemoglobin electrophoresis demonstrate normal adult hemoglobin. Immunofixation was normal.  Review of Systems - Oncology ROS as per HPI otherwise 12 point ROS is negative.  MEDICAL HISTORY: Past Medical History:  Diagnosis Date  . Anxiety   . Cystitis   . Depression   . Gastritis   . GERD (gastroesophageal  reflux disease)   . Hashimoto's thyroiditis    Hx   . Hyperlipidemia   . Hypertension   . Hypothyroidism   . Normocytic anemia 2009 Hb 9.9-11.1  . Osteopenia   . Shoulder pain   . Urinary incontinence     SURGICAL HISTORY: Past Surgical History:  Procedure Laterality Date  . Bilateral tubal ligation    . BREAST EXCISIONAL BIOPSY  2010   Excisional biopsy of benign left breast mass -lopoma   . BREAST SURGERY     left nreast biopsy  . COLONOSCOPY  08/26/2011   SLF: 1. Internal hemorrhoids  . COLONOSCOPY N/A 10/06/2015   IFB:PPHK fissure or internal hemorrhoids/mild sigmoid colitis. benign colonic path   . colonscopy  2005   Dr. Tamala Julian  . ESOPHAGOGASTRODUODENOSCOPY  12/2009   chronic gastritis  . ESOPHAGOGASTRODUODENOSCOPY N/A 05/17/2017   Procedure: ESOPHAGOGASTRODUODENOSCOPY (EGD);  Surgeon: Danie Binder, MD;  [facility];  Service: Endoscopy;  Laterality: N/A;  830   . GIVENS CAPSULE STUDY N/A 05/30/2017   Procedure: GIVENS CAPSULE STUDY;  Surgeon: Danie Binder, MD;  Location: [facility];  Service: Endoscopy;  Laterality: N/A;  700  . left knee surgery Left March 1,2017  . Resection of left lobe of thyroid    . right carpal tunnel release    . rt. knee athroscopy  2004  . SAVORY DILATION N/A 05/17/2017   Procedure: SAVORY DILATION;  Surgeon: Danie Binder, MD;  Location: [facility];  Service: Endoscopy;  Laterality: N/A;  . TOTAL ABDOMINAL HYSTERECTOMY  1994  . UMBILICAL HERNIA REPAIR    . Urethral dilation for stenosis  2009    SOCIAL HISTORY: Social History   Social History  . Marital status: Married  Spouse name: N/A  . Number of children: 3  . Years of education: N/A   Occupational History  . 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 Smoking status: Former Smoker    Packs/day: 0.50    Types: Cigarettes    Quit date: 08/25/1972  . Smokeless tobacco: Never Used  . Alcohol use No  . Drug use: No  .  Sexual activity: Yes     Comment: married   Other Topics Concern  . Not on file   Social History Narrative  . No narrative on file    FAMILY HISTORY Family History  Problem Relation Age of Onset  . Pneumonia Mother   . Colon cancer Neg Hx   . Anesthesia problems Neg Hx   . Hypotension Neg Hx   . Malignant hyperthermia Neg Hx   . Pseudochol deficiency Neg Hx     ALLERGIES:  is allergic to dilaudid [hydromorphone hcl]; levaquin [levofloxacin]; nsaids; and promethazine.  MEDICATIONS:  Current Outpatient Prescriptions  Medication Sig Dispense Refill  . acetaminophen (TYLENOL) 500 MG tablet Take 500-1,000 mg by mouth every 6 (six) hours as needed (for pain.).    Marland Kitchen atorvastatin (LIPITOR) 20 MG tablet Take 1 tablet (20 mg total) by mouth daily. 90 tablet 1  . azelastine (ASTELIN) 0.1 % nasal spray Place 2 sprays into both nostrils 2 (two) times daily. Use in each nostril as directed 30 mL 12  . citalopram (CELEXA) 10 MG tablet TAKE 1 TABLET(10 MG) BY MOUTH DAILY 90 tablet 0  . diclofenac sodium (VOLTAREN) 1 % GEL Apply 2-4 g topically 4 (four) times daily as needed. For knee pain.  0  . gabapentin (NEURONTIN) 100 MG capsule Take 1 capsule (100 mg total) by mouth at bedtime. (Patient taking differently: Take 100 mg by mouth daily as needed (for restless legs). ) 30 capsule 4  . hydrochlorothiazide (HYDRODIURIL) 25 MG tablet Take 1 tablet (25 mg total) by mouth daily. 90 tablet 1  . levothyroxine (SYNTHROID, LEVOTHROID) 112 MCG tablet TAKE 1 TABLET(112 MCG) BY MOUTH DAILY BEFORE BREAKFAST 90 tablet 0  . linaclotide (LINZESS) 72 MCG capsule Take 1 capsule (72 mcg total) by mouth daily before breakfast. 30 capsule 11  . loratadine (CLARITIN) 10 MG tablet Take 1 tablet (10 mg total) by mouth daily. 30 tablet 3  . losartan (COZAAR) 50 MG tablet Take 1 tablet (50 mg total) by mouth daily. 90 tablet 1  . Multiple Vitamin (MULTIVITAMIN) tablet Take 1 tablet by mouth daily after breakfast.  Centrum Silver    . oxybutynin (DITROPAN) 5 MG/5ML syrup Take 5 mLs (5 mg total) by mouth 2 (two) times daily. (Patient taking differently: Take 2.5 mg by mouth daily. ) 300 mL 3  . pantoprazole (PROTONIX) 40 MG tablet Take 1 tablet (40 mg total) by mouth 2 (two) times daily. 180 tablet 1  . Polyethyl Glycol-Propyl Glycol (SYSTANE) 0.4-0.3 % SOLN Apply 1 drop to eye 3 (three) times daily as needed (for dry eyes.).    Marland Kitchen traMADol (ULTRAM) 50 MG tablet Take 50-100 mg by mouth every 6 (six) hours as needed. For knee pain.  0  . vitamin E 400 UNIT capsule Take 400 Units by mouth daily after breakfast.      No current facility-administered medications for this visit.     PHYSICAL EXAMINATION:   Vitals:   07/19/17 0849 07/19/17 0852  BP: (!) 142/78 (!) 142/78  Pulse: 65   Resp:  16   SpO2: 100%     Filed Weights   07/19/17 0849 07/19/17 0852  Weight: 176 lb 8 oz (80.1 kg) 176 lb 8 oz (80.1 kg)     Physical Exam Constitutional: Well-developed, well-nourished, and in no distress.   HENT:  Head: Normocephalic and atraumatic.  Mouth/Throat: No oropharyngeal exudate. Mucosa moist. Eyes: Pupils are equal, round, and reactive to light. Conjunctivae are normal. No scleral icterus.  Neck: Normal range of motion. Neck supple. No JVD present.  Cardiovascular: Normal rate, regular rhythm and normal heart sounds.  Exam reveals no gallop and no friction rub.   No murmur heard. Pulmonary/Chest: Effort normal and breath sounds normal. No respiratory distress. No wheezes.No rales.  Abdominal: Soft. Bowel sounds are normal. No distension. There is no tenderness. There is no guarding.  Musculoskeletal: No edema or tenderness.  Lymphadenopathy:    No cervical or supraclavicular adenopathy.  Neurological: Alert and oriented to person, place, and time. No cranial nerve deficit.  Skin: Skin is warm and dry. No rash noted. No erythema. No pallor.  Psychiatric: Affect and judgment normal.    LABORATORY  DATA: I have personally reviewed the data as listed:  Appointment on 07/07/2017  Component Date Value Ref Range Status  . Sodium 07/07/2017 138  135 - 145 mmol/L Final  . Potassium 07/07/2017 3.1* 3.5 - 5.1 mmol/L Final  . Chloride 07/07/2017 101  101 - 111 mmol/L Final  . CO2 07/07/2017 30  22 - 32 mmol/L Final  . Glucose, Bld 07/07/2017 100* 65 - 99 mg/dL Final  . BUN 07/07/2017 13  6 - 20 mg/dL Final  . Creatinine, Ser 07/07/2017 0.87  0.44 - 1.00 mg/dL Final  . Calcium 07/07/2017 9.6  8.9 - 10.3 mg/dL Final  . Total Protein 07/07/2017 7.4  6.5 - 8.1 g/dL Final  . Albumin 07/07/2017 4.0  3.5 - 5.0 g/dL Final  . AST 07/07/2017 19  15 - 41 U/L Final  . ALT 07/07/2017 18  14 - 54 U/L Final  . Alkaline Phosphatase 07/07/2017 64  38 - 126 U/L Final  . Total Bilirubin 07/07/2017 0.5  0.3 - 1.2 mg/dL Final  . GFR calc non Af Amer 07/07/2017 >60  >60 mL/min Final  . GFR calc Af Amer 07/07/2017 >60  >60 mL/min Final   Comment: (NOTE) The eGFR has been calculated using the CKD EPI equation. This calculation has not been validated in all clinical situations. eGFR's persistently <60 mL/min signify possible Chronic Kidney Disease.   . Anion gap 07/07/2017 7  5 - 15 Final  . Erythropoietin 07/07/2017 10.6  2.6 - 18.5 mIU/mL Final   Comment: (NOTE) [employer] UniCel DxI 800 Immunoassay System Performed At: [facility] [place], [place] [id number] Lindon Romp MD [id number]   . Iron 07/07/2017 60  28 - 170 ug/dL Final  . TIBC 07/07/2017 265  250 - 450 ug/dL Final  . Saturation Ratios 07/07/2017 23  10.4 - 31.8 % Final  . UIBC 07/07/2017 205  ug/dL Final   Performed at [facility], [place] [address] [place], [place] [address]  . Ferritin 07/07/2017 105  11 - 307 ng/mL Final   Performed at [facility], [place] [address], [place],  [address]  . Vitamin B-12 07/07/2017 1163* 180 - 914 pg/mL Final   Comment: (NOTE) This assay is not  validated for testing neonatal or myeloproliferative syndrome specimens for Vitamin B12 levels. Performed at [facility], [address], [place], [place] [address]   . Folate 07/07/2017 34.0  >5.9 ng/mL Final   Performed at [facility], [place] [address] [place], [place] [address]  . Hgb A2 Quant 07/07/2017 1.9  1.8 - 3.2 % Final  . Hgb F Quant 07/07/2017 0.0  0.0 - 2.0 % Final  . Hgb S Quant 07/07/2017 0.0  0.0 % Final  . Hgb C 07/07/2017 0.0  0.0 % Corrected  . Hgb A 07/07/2017 98.1  96.4 - 98.8 % Final  . Hgb Variant 07/07/2017 0.0  0.0 % Corrected  . Please Note: 07/07/2017 Comment   Corrected   Comment: (NOTE) Normal adult hemoglobin present. Performed At: [facility] [place], [place] [id number] Lindon Romp MD [id number]   . Retic Ct Pct 07/07/2017 1.7  0.4 - 3.1 % Final  . RBC. 07/07/2017 4.00  3.87 - 5.11 MIL/uL Final  . Retic Count, Absolute 07/07/2017 68.0  19.0 - 186.0 K/uL Final  . IgG (Immunoglobin G), Serum 07/07/2017 1567  700 - 1,600 mg/dL Final  . IgA 07/07/2017 221  87 - 352 mg/dL Final  . IgM (Immunoglobulin M), Srm 07/07/2017 117  26 - 217 mg/dL Final  . Total Protein ELP 07/07/2017 7.3  6.0 - 8.5 g/dL Corrected  . Albumin SerPl Elph-Mcnc 07/07/2017 3.9  2.9 - 4.4 g/dL Corrected  . Alpha 1 07/07/2017 0.2  0.0 - 0.4 g/dL Corrected  . Alpha2 Glob SerPl Elph-Mcnc 07/07/2017 0.7  0.4 - 1.0 g/dL Corrected  . B-Globulin SerPl Elph-Mcnc 07/07/2017 1.0  0.7 - 1.3 g/dL Corrected  . Gamma Glob SerPl Elph-Mcnc 07/07/2017 1.5  0.4 - 1.8 g/dL Corrected  . M Protein SerPl Elph-Mcnc 07/07/2017 Not Observed  Not Observed g/dL Corrected  . Globulin, Total 07/07/2017 3.4  2.2 - 3.9 g/dL Corrected  . Albumin/Glob SerPl 07/07/2017 1.2  0.7 - 1.7 Corrected  . IFE 1 07/07/2017 Comment   Corrected   An apparent normal immunofixation pattern.  . Please Note 07/07/2017 Comment   Corrected   Comment: (NOTE) Protein electrophoresis scan will  follow via computer, mail, or courier delivery. Performed At: [facility] [place], [place] [id number] Lindon Romp MD [id number]   Office Visit on 07/07/2017  Component Date Value Ref Range Status  . ANA Ab, IFA 07/07/2017 Negative   Final   Comment: (NOTE)                                     Negative   <1:80                                     Borderline  1:80                                     Positive   >1:80 Performed At: [facility] [place], [place] [id number] Lindon Romp MD [id number]   Office Visit on 06/21/2017  Component Date Value Ref Range Status  . Cholesterol, Total 06/21/2017 185  100 - 199 mg/dL Final  . Triglycerides 06/21/2017 183* 0 - 149 mg/dL Final  . HDL 06/21/2017 46  >39 mg/dL Final  . VLDL Cholesterol Cal 06/21/2017 37  5 -  40 mg/dL Final  . LDL Calculated 06/21/2017 102* 0 - 99 mg/dL Final  . Chol/HDL Ratio 06/21/2017 4.0  0.0 - 4.4 ratio Final   Comment:                                   T. Chol/HDL Ratio                                             Men  Women                               1/2 Avg.Risk  3.4    3.3                                   Avg.Risk  5.0    4.4                                2X Avg.Risk  9.6    7.1                                3X Avg.Risk 23.4   11.0   . TSH 06/21/2017 0.045* 0.450 - 4.500 uIU/mL Final  . Vit D, 25-Hydroxy 06/21/2017 33.6  30.0 - 100.0 ng/mL Final   Comment: Vitamin D deficiency has been defined by the [place] practice guideline as a level of serum 25-OH vitamin D less than 20 ng/mL (1,2). The Endocrine Society went on to further define vitamin D insufficiency as a level between 21 and 29 ng/mL (2). 1. IOM (Institute of Medicine). 2010. Dietary reference    intakes for calcium and D. [place]: The    [employer]. 2. Holick MF, Bink[place], Bischoff-Ferrari HA, et al.    Evaluation,  treatment, and prevention of vitamin D    deficiency: an Endocrine Society clinical practice    guideline. JCEM. 2011 Jul; 96(7):1911-30.   Marland Kitchen Glucose 06/21/2017 97  65 - 99 mg/dL Final  . BUN 06/21/2017 10  8 - 27 mg/dL Final  . Creatinine, Ser 06/21/2017 0.93  0.57 - 1.00 mg/dL Final  . GFR calc non Af Amer 06/21/2017 64  >59 mL/min/1.73 Final  . GFR calc Af Amer 06/21/2017 74  >59 mL/min/1.73 Final  . BUN/Creatinine Ratio 06/21/2017 11* 12 - 28 Final  . Sodium 06/21/2017 143  134 - 144 mmol/L Final  . Potassium 06/21/2017 4.0  3.5 - 5.2 mmol/L Final  . Chloride 06/21/2017 101  96 - 106 mmol/L Final  . CO2 06/21/2017 26  20 - 29 mmol/L Final  . Calcium 06/21/2017 10.1  8.7 - 10.3 mg/dL Final  . Total Protein 06/21/2017 7.3  6.0 - 8.5 g/dL Final  . Albumin 06/21/2017 4.5  3.6 - 4.8 g/dL Final  . Globulin, Total 06/21/2017 2.8  1.5 - 4.5 g/dL Final  . Albumin/Globulin Ratio 06/21/2017 1.6  1.2 - 2.2 Final  . Bilirubin Total 06/21/2017 0.5  0.0 - 1.2 mg/dL Final  . Alkaline Phosphatase 06/21/2017 86  39 - 117  IU/L Final  . AST 06/21/2017 24  0 - 40 IU/L Final  . ALT 06/21/2017 20  0 - 32 IU/L Final  . Color, UA 06/21/2017 yellow   Final  . Clarity, UA 06/21/2017 clear   Final  . Glucose, UA 06/21/2017 neg   Final  . Bilirubin, UA 06/21/2017 neg   Final  . Ketones, UA 06/21/2017 neg   Final  . Spec Grav, UA 06/21/2017 1.020  1.010 - 1.025 Final  . Blood, UA 06/21/2017 trace intact   Final  . pH, UA 06/21/2017 6.5  5.0 - 8.0 Final  . Protein, UA 06/21/2017 neg   Final  . Urobilinogen, UA 06/21/2017 0.2  0.2 or 1.0 E.U./dL Final  . Nitrite, UA 06/21/2017 neg   Final  . Leukocytes, UA 06/21/2017 Small (1+)* Negative Final  . Organism ID, Bacteria 06/21/2017 NO GROWTH   Final    RADIOGRAPHIC STUDIES: I have personally reviewed the radiological images as listed and agree with the findings in the report  No results found.  ASSESSMENT: Chronic normocytic anemia dating back to 2009,  hemoglobin in the 10-11 g/dL range.  Negative GI workup.  PLAN: Reviewed her anemia work up labs in detail with her. No cause found for her anemia. She may have some inherited genetic trait causing her anemia that is not picked up on hemoglobin electrophoresis.  Patient is asymptomatic. I would continue observation of her CBC at this time. RTC in 6 months for follow up with labs as stated below.  Orders Placed This Encounter  Procedures  . CBC with Differential    Standing Status:   Future    Standing Expiration Date:   07/19/2018  . Comprehensive metabolic panel    Standing Status:   Future    Standing Expiration Date:   07/19/2018  . Iron and TIBC    Standing Status:   Future    Standing Expiration Date:   07/19/2018  . Ferritin    Standing Status:   Future    Standing Expiration Date:   07/19/2018  . Vitamin B12    Standing Status:   Future    Standing Expiration Date:   07/19/2018    All questions were answered. The patient knows to call the clinic with any problems, questions or concerns.  This note was electronically signed.    Twana First, MD  07/19/2017 9:08 AM

## 2017-07-24 ENCOUNTER — Ambulatory Visit: Payer: Medicare Other

## 2017-07-25 ENCOUNTER — Ambulatory Visit (INDEPENDENT_AMBULATORY_CARE_PROVIDER_SITE_OTHER): Payer: Medicare Other

## 2017-07-25 VITALS — BP 120/72 | HR 64 | Temp 98.1°F | Ht 64.0 in | Wt 175.1 lb

## 2017-07-25 DIAGNOSIS — Z0000 Encounter for general adult medical examination without abnormal findings: Secondary | ICD-10-CM | POA: Diagnosis not present

## 2017-07-25 NOTE — Progress Notes (Signed)
Subjective:   Laura [name] is a 66 y.o. female who presents for Medicare Annual (Subsequent) preventive examination.  Review of Systems:  Cardiac Risk Factors include: advanced age (>23men, >5 women);dyslipidemia;hypertension;obesity (BMI >30kg/m2);sedentary lifestyle     Objective:     Vitals: BP 120/72   Pulse 64   Temp 98.1 F (36.7 C) (Temporal)   Ht  (1.626 m)   Wt 175 lb 1.9 oz (79.4 kg)   BMI 30.06 kg/m   Body mass index is 30.06 kg/m.   Tobacco History  Smoking Status  . Former Smoker  . Packs/day: 0.25  . Years: 1.00  . Types: Cigarettes  . Quit date: 08/25/1972  Smokeless Tobacco  . Never Used     Counseling given: Not Answered   Past Medical History:  Diagnosis Date  . Anxiety   . Cystitis   . Depression   . Gastritis   . GERD (gastroesophageal reflux disease)   . Hashimoto's thyroiditis    Hx   . Hyperlipidemia   . Hypertension   . Hypothyroidism   . Normocytic anemia 2009 Hb 9.9-11.1  . Osteopenia   . Shoulder pain   . Urinary incontinence    Past Surgical History:  Procedure Laterality Date  . Bilateral tubal ligation    . BREAST EXCISIONAL BIOPSY  2010   Excisional biopsy of benign left breast mass -lopoma   . BREAST SURGERY     left nreast biopsy  . COLONOSCOPY  08/26/2011   SLF: 1. Internal hemorrhoids  . COLONOSCOPY N/A 10/06/2015   ZOX:WRUE fissure or internal hemorrhoids/mild sigmoid colitis. benign colonic path   . colonscopy  2005   Dr. Katrinka Blazing  . ESOPHAGOGASTRODUODENOSCOPY  12/2009   chronic gastritis  . ESOPHAGOGASTRODUODENOSCOPY N/A 05/17/2017   Procedure: ESOPHAGOGASTRODUODENOSCOPY (EGD);  Surgeon: West Bali, MD;  Location: [facility];  Service: Endoscopy;  Laterality: N/A;  830   . GIVENS CAPSULE STUDY N/A 05/30/2017   Procedure: GIVENS CAPSULE STUDY;  Surgeon: West Bali, MD;  Location: [facility];  Service: Endoscopy;  Laterality: N/A;  700  . left knee surgery Left March 1,2017  . Resection of  left lobe of thyroid    . right carpal tunnel release    . rt. knee athroscopy  2004  . SAVORY DILATION N/A 05/17/2017   Procedure: SAVORY DILATION;  Surgeon: West Bali, MD;  Location: [facility];  Service: Endoscopy;  Laterality: N/A;  . TOTAL ABDOMINAL HYSTERECTOMY  1994  . UMBILICAL HERNIA REPAIR    . Urethral dilation for stenosis  2009   Family History  Problem Relation Age of Onset  . Pneumonia Mother   . Colon cancer Neg Hx   . Anesthesia problems Neg Hx   . Hypotension Neg Hx   . Malignant hyperthermia Neg Hx   . Pseudochol deficiency Neg Hx    History  Sexual Activity  . Sexual activity: Yes  . Birth control/ protection: Surgical    Comment: married    Outpatient Encounter Prescriptions as of 07/25/2017  Medication Sig  . acetaminophen (TYLENOL) 500 MG tablet Take 500-1,000 mg by mouth every 6 (six) hours as needed (for pain.).  Marland Kitchen atorvastatin (LIPITOR) 20 MG tablet Take 1 tablet (20 mg total) by mouth daily.  Marland Kitchen azelastine (ASTELIN) 0.1 % nasal spray Place 2 sprays into both nostrils 2 (two) times daily. Use in each nostril as directed (Patient taking differently: Place 2 sprays into both nostrils 2 (two) times daily  as needed for allergies. Use in each nostril as directed)  . citalopram (CELEXA) 10 MG tablet TAKE 1 TABLET(10 MG) BY MOUTH DAILY  . gabapentin (NEURONTIN) 100 MG capsule Take 1 capsule (100 mg total) by mouth at bedtime. (Patient taking differently: Take 100 mg by mouth daily as needed (for restless legs). )  . hydrochlorothiazide (HYDRODIURIL) 25 MG tablet Take 1 tablet (25 mg total) by mouth daily.  Marland Kitchen levothyroxine (SYNTHROID, LEVOTHROID) 112 MCG tablet TAKE 1 TABLET(112 MCG) BY MOUTH DAILY BEFORE BREAKFAST  . linaclotide (LINZESS) 72 MCG capsule Take 1 capsule (72 mcg total) by mouth daily before breakfast.  . loratadine (CLARITIN) 10 MG tablet Take 1 tablet (10 mg total) by mouth daily.  Marland Kitchen losartan (COZAAR) 50 MG tablet Take 1 tablet (50 mg total)  by mouth daily.  . Multiple Vitamin (MULTIVITAMIN) tablet Take 1 tablet by mouth daily after breakfast. Centrum Silver  . pantoprazole (PROTONIX) 40 MG tablet Take 1 tablet (40 mg total) by mouth 2 (two) times daily.  Bertram Gala Glycol-Propyl Glycol (SYSTANE) 0.4-0.3 % SOLN Apply 1 drop to eye 3 (three) times daily as needed (for dry eyes.).  Marland Kitchen traMADol (ULTRAM) 50 MG tablet Take 50-100 mg by mouth every 6 (six) hours as needed. For knee pain.  . vitamin E 400 UNIT capsule Take 400 Units by mouth daily after breakfast.   . oxybutynin (DITROPAN) 5 MG/5ML syrup Take 5 mLs (5 mg total) by mouth 2 (two) times daily. (Patient not taking: Reported on 07/25/2017)  . [DISCONTINUED] diclofenac sodium (VOLTAREN) 1 % GEL Apply 2-4 g topically 4 (four) times daily as needed. For knee pain.   No facility-administered encounter medications on file as of 07/25/2017.     Activities of Daily Living In your present state of health, do you have any difficulty performing the following activities: 07/25/2017 09/10/2016  Hearing? N N  Vision? N N  Difficulty concentrating or making decisions? N N  Walking or climbing stairs? Y N  Comment due to knee pain, has an appointment to discuss knee surgery -  Dressing or bathing? N N  Doing errands, shopping? N N  Preparing Food and eating ? N -  Using the Toilet? N -  In the past six months, have you accidently leaked urine? N -  Do you have problems with loss of bowel control? N -  Managing your Medications? N -  Managing your Finances? N -  Housekeeping or managing your Housekeeping? N -  Some recent data might be hidden    Patient Care Team: Kerri Perches, MD as PCP - General West Bali, MD (Gastroenterology) Laqueta Linden, MD as Attending Physician (Cardiology) Beverely Low, MD as Consulting Physician (Orthopedic Surgery) Roma Kayser, MD as Consulting Physician (Endocrinology) Ollen Gross, MD as Consulting Physician  (Orthopedic Surgery)    Assessment:    Exercise Activities and Dietary recommendations Current Exercise Habits: The patient does not participate in regular exercise at present, Exercise limited by: orthopedic condition(s)  Goals    . Exercise 3x per week (30 min per time)          Recommend starting a routine exercise program at least 3 days a week for 30-45 minutes at a time as tolerated.        Fall Risk Fall Risk  07/25/2017 03/06/2017 11/01/2016 08/26/2016 04/12/2016  Falls in the past year? No No Yes No No  Number falls in past yr: - - 1 - -  Injury with  Fall? - - Yes - -   Depression Screen PHQ 2/9 Scores 07/25/2017 03/06/2017 11/01/2016 08/26/2016  PHQ - 2 Score 0 0 0 0  PHQ- 9 Score - - - -     Cognitive Function: Normal   6CIT Screen 07/25/2017  What Year? 0 points  What month? 0 points  What time? 0 points  Count back from 20 0 points  Months in reverse 0 points  Repeat phrase 0 points  Total Score 0    Immunization History  Administered Date(s) Administered  . Influenza Split 07/17/2012  . Influenza Whole 10/08/2003, 07/01/2010, 07/14/2011  . Influenza,inj,Quad PF,6+ Mos 08/14/2013, 08/28/2014, 09/03/2015, 08/10/2016, 06/21/2017  . Pneumococcal Conjugate-13 06/05/2014  . Pneumococcal Polysaccharide-23 11/20/2015  . Td 04/21/2004  . Tdap 06/05/2014  . Zoster 03/31/2011   Screening Tests Health Maintenance  Topic Date Due  . MAMMOGRAM  09/29/2018  . TETANUS/TDAP  06/05/2024  . COLONOSCOPY  10/05/2025  . INFLUENZA VACCINE  Completed  . DEXA SCAN  Completed  . Hepatitis C Screening  Completed  . PNA vac Low Risk Adult  Completed      Plan:   I have personally reviewed and noted the following in the patient's chart:   . Medical and social history . Use of alcohol, tobacco or illicit drugs  . Current medications and supplements . Functional ability and status . Nutritional status . Physical activity . Advanced directives . List of other  physicians . Hospitalizations, surgeries, and ER visits in previous 12 months . Vitals . Screenings to include cognitive, depression, and falls . Referrals and appointments: [facility] referral sent today.   In addition, I have reviewed and discussed with patient certain preventive protocols, quality metrics, and best practice recommendations. A written personalized care plan for preventive services as well as general preventive health recommendations were provided to patient.     Candis Shine, LPN  1/61/0960

## 2017-07-25 NOTE — Patient Instructions (Signed)
[name] , Thank you for taking time to come for your Medicare Wellness Visit. I appreciate your ongoing commitment to your health goals. Please review the following plan we discussed and let me know if I can assist you in the future.   Screening recommendations/referrals: Colonoscopy: Up to date, next due 09/2025 Mammogram: Up to date, next due 08/2017 Bone Density: Up to date Recommended yearly ophthalmology/optometry visit for glaucoma screening and checkup Recommended yearly dental visit for hygiene and checkup  Vaccinations: Influenza vaccine: Up to date Pneumococcal vaccine: Up to date Tdap vaccine: Up to date, next due 05/2024 Shingles vaccine: Done    Advanced directives: Advance directive discussed with you today. I have provided a copy for you to complete at home and have notarized. Once this is complete please bring a copy in to our office so we can scan it into your chart.  Conditions/risks identified: Obese, recommend starting a routine exercise program at least 3 days a week for 30-45 minutes at a time as tolerated.   Next appointment: Follow up with Dr. Lodema Hong on 10/11/2017 at 9:00 am for your complete physical. Follow up in 1 year for your annual wellness visit.   Preventive Care 67 Years and Older, Female Preventive care refers to lifestyle choices and visits with your health care provider that can promote health and wellness. What does preventive care include?  A yearly physical exam. This is also called an annual well check.  Dental exams once or twice a year.  Routine eye exams. Ask your health care provider how often you should have your eyes checked.  Personal lifestyle choices, including:  Daily care of your teeth and gums.  Regular physical activity.  Eating a healthy diet.  Avoiding tobacco and drug use.  Limiting alcohol use.  Practicing safe sex.  Taking low-dose aspirin every day.  Taking vitamin and mineral supplements as recommended by  your health care provider. What happens during an annual well check? The services and screenings done by your health care provider during your annual well check will depend on your age, overall health, lifestyle risk factors, and family history of disease. Counseling  Your health care provider may ask you questions about your:  Alcohol use.  Tobacco use.  Drug use.  Emotional well-being.  Home and relationship well-being.  Sexual activity.  Eating habits.  History of falls.  Memory and ability to understand (cognition).  Work and work Astronomer.  Reproductive health. Screening  You may have the following tests or measurements:  Height, weight, and BMI.  Blood pressure.  Lipid and cholesterol levels. These may be checked every 5 years, or more frequently if you are over 67 years old.  Skin check.  Lung cancer screening. You may have this screening every year starting at age 67 if you have a 30-pack-year history of smoking and currently smoke or have quit within the past 15 years.  Fecal occult blood test (FOBT) of the stool. You may have this test every year starting at age 67.  Flexible sigmoidoscopy or colonoscopy. You may have a sigmoidoscopy every 5 years or a colonoscopy every 10 years starting at age 67.  Hepatitis C blood test.  Hepatitis B blood test.  Sexually transmitted disease (STD) testing.  Diabetes screening. This is done by checking your blood sugar (glucose) after you have not eaten for a while (fasting). You may have this done every 1-3 years.  Bone density scan. This is done to screen for osteoporosis. You may have  this done starting at age 67.  Mammogram. This may be done every 1-2 years. Talk to your health care provider about how often you should have regular mammograms. Talk with your health care provider about your test results, treatment options, and if necessary, the need for more tests. Vaccines  Your health care provider may  recommend certain vaccines, such as:  Influenza vaccine. This is recommended every year.  Tetanus, diphtheria, and acellular pertussis (Tdap, Td) vaccine. You may need a Td booster every 10 years.  Zoster vaccine. You may need this after age 55.  Pneumococcal 13-valent conjugate (PCV13) vaccine. One dose is recommended after age 52.  Pneumococcal polysaccharide (PPSV23) vaccine. One dose is recommended after age 58. Talk to your health care provider about which screenings and vaccines you need and how often you need them. This information is not intended to replace advice given to you by your health care provider. Make sure you discuss any questions you have with your health care provider. Document Released: 11/13/2015 Document Revised: 07/06/2016 Document Reviewed: 08/18/2015 Elsevier Interactive Patient Education  2017 [employer] Prevention in the Home Falls can cause injuries. They can happen to people of all ages. There are many things you can do to make your home safe and to help prevent falls. What can I do on the outside of my home?  Regularly fix the edges of walkways and driveways and fix any cracks.  Remove anything that might make you trip as you walk through a door, such as a raised step or threshold.  Trim any bushes or trees on the path to your home.  Use bright outdoor lighting.  Clear any walking paths of anything that might make someone trip, such as rocks or tools.  Regularly check to see if handrails are loose or broken. Make sure that both sides of any steps have handrails.  Any raised decks and porches should have guardrails on the edges.  Have any leaves, snow, or ice cleared regularly.  Use sand or salt on walking paths during winter.  Clean up any spills in your garage right away. This includes oil or grease spills. What can I do in the bathroom?  Use night lights.  Install grab bars by the toilet and in the tub and shower. Do not use towel  bars as grab bars.  Use non-skid mats or decals in the tub or shower.  If you need to sit down in the shower, use a plastic, non-slip stool.  Keep the floor dry. Clean up any water that spills on the floor as soon as it happens.  Remove soap buildup in the tub or shower regularly.  Attach bath mats securely with double-sided non-slip rug tape.  Do not have throw rugs and other things on the floor that can make you trip. What can I do in the bedroom?  Use night lights.  Make sure that you have a light by your bed that is easy to reach.  Do not use any sheets or blankets that are too big for your bed. They should not hang down onto the floor.  Have a firm chair that has side arms. You can use this for support while you get dressed.  Do not have throw rugs and other things on the floor that can make you trip. What can I do in the kitchen?  Clean up any spills right away.  Avoid walking on wet floors.  Keep items that you use a lot in easy-to-reach  places.  If you need to reach something above you, use a strong step stool that has a grab bar.  Keep electrical cords out of the way.  Do not use floor polish or wax that makes floors slippery. If you must use wax, use non-skid floor wax.  Do not have throw rugs and other things on the floor that can make you trip. What can I do with my stairs?  Do not leave any items on the stairs.  Make sure that there are handrails on both sides of the stairs and use them. Fix handrails that are broken or loose. Make sure that handrails are as long as the stairways.  Check any carpeting to make sure that it is firmly attached to the stairs. Fix any carpet that is loose or worn.  Avoid having throw rugs at the top or bottom of the stairs. If you do have throw rugs, attach them to the floor with carpet tape.  Make sure that you have a light switch at the top of the stairs and the bottom of the stairs. If you do not have them, ask someone to  add them for you. What else can I do to help prevent falls?  Wear shoes that:  Do not have high heels.  Have rubber bottoms.  Are comfortable and fit you well.  Are closed at the toe. Do not wear sandals.  If you use a stepladder:  Make sure that it is fully opened. Do not climb a closed stepladder.  Make sure that both sides of the stepladder are locked into place.  Ask someone to hold it for you, if possible.  Clearly mark and make sure that you can see:  Any grab bars or handrails.  First and last steps.  Where the edge of each step is.  Use tools that help you move around (mobility aids) if they are needed. These include:  Canes.  Walkers.  Scooters.  Crutches.  Turn on the lights when you go into a dark area. Replace any light bulbs as soon as they burn out.  Set up your furniture so you have a clear path. Avoid moving your furniture around.  If any of your floors are uneven, fix them.  If there are any pets around you, be aware of where they are.  Review your medicines with your doctor. Some medicines can make you feel dizzy. This can increase your chance of falling. Ask your doctor what other things that you can do to help prevent falls. This information is not intended to replace advice given to you by your health care provider. Make sure you discuss any questions you have with your health care provider. Document Released: 08/13/2009 Document Revised: 03/24/2016 Document Reviewed: 11/21/2014 Elsevier Interactive Patient Education  2017 [employer].

## 2017-07-27 NOTE — Progress Notes (Signed)
Please place orders in EPIC as patient has a pre-op appointment on 08/03/2017! Thank you! 

## 2017-07-28 ENCOUNTER — Ambulatory Visit: Payer: Self-pay | Admitting: Orthopedic Surgery

## 2017-07-28 NOTE — H&P (Signed)
[name] DOB: 1951-02-08 Married / Language: [place] / Race: Black or African American Female Date of Admission: August 09, 2017  Chief complaint: Left knee pain History of Present Illness  The patient is a 66 year old female who comes in for a preoperative History and Physical. The patient is scheduled for a left unicompartmental knee arthroplasty to be performed by Dr. Gus Rankin. Aluisio, MD at [facility] on 08-09-2017. The patient was seen in referral from Dr. Ranell Patrick. The patient reports left knee and right knee symptoms including: pain, swelling, stiffness and soreness which began month(s) ago without any known injury. The patient describes the severity of the symptoms as moderate in severity. The patient feels that the symptoms are worsening. Symptoms are reported to be located in the left anterior knee, left posterior knee, right anterior knee and right posterior knee and include knee pain, swelling, stiffness, difficulty bearing weight and difficulty ambulating. The patient does not report any radiation of symptoms. Symptoms are exacerbated by motion at the knee, weight bearing and walking. Symptoms are relieved by rest, use of a brace (on the right knee) and non-opioid analgesics (tramadol prn). She has significant pain and dysfunction in both knees. She has had a previous arthroscopy and RIGHT knee showing medial compartment degenerative changes. The problem has gotten much worse in the past 6-9 months. She is now hurting at all times. All of her pain is medial in both knees. She is not having any swelling in the knees. They are not giving out on her. Dr. Ranell Patrick has evaluated her and had x-rays done last fall and then repeated 2 weeks ago showing a progressive medial compartment degenerative change in both knees. She did not have lateral or patellofemoral involvement. It is felt hat she could benefit from undergoing a partial knee replacement at this time. The patient is at  the stage now where she is ready to go ahead and have a more permanent solution to her knee problem. Injections have also not been beneficial. They have been treated conservatively in the past for the above stated problem and despite conservative measures, they continue to have progressive pain and severe functional limitations and dysfunction. They have failed non-operative management including home exercise, medications, and injections. It is felt that they would benefit from undergoing total joint replacement. Risks and benefits of the procedure have been discussed with the patient and they elect to proceed with surgery. There are no active contraindications to surgery such as ongoing infection or rapidly progressive neurological disease.   Problem List/Past Medical Acute bilateral low back pain with bilateral sciatica (M54.42, M54.41)  Chronic pain of left knee (M25.562)  Primary osteoarthritis of right knee (M17.11)  Hiatal Hernia  Diverticulitis Of Colon  High blood pressure  Gastroesophageal Reflux Disease  Hypercholesterolemia  Hypothyroidism  Bilateral low back pain with bilateral sciatica (M54.42, M54.41)  Varicose veins  Irritable bowel syndrome  Urinary Incontinence  Anemia   Allergies Dilaudid-HP *ANALGESICS - OPIOID*  confusion, hallucinations Levaquin *Fluoroquinolones NSAIDs  GI Upset  Family History Cerebrovascular Accident  Maternal Grandmother. Hypertension  Maternal Grandmother.  Social History Tobacco use  Former smoker. 09/08/2015: smoke(d) less than 1/2 pack(s) per day Not under pain contract  Children  3 Current work status  retired Exercise  Exercises rarely; does running / walking Living situation  live with spouse Marital status  married Never consumed alcohol  09/08/2015: Never consumed alcohol No history of drug/alcohol rehab   Medication History Acetaminophen (  Tablet, Oral)  Active. Atorvastatin Calcium (   Tablet, Oral) Active. Astelin (137MCG/SPRAY Solution, Nasal) Active. Citalopram Hydrobromide (  Tablet, Oral) Active. Diclofenac Sodium (1% Gel, External) Active. Gabapentin (  Capsule, Oral) Active. Hydrochlorothiazide (  Tablet, Oral) Active. Levothyroxine Sodium ( Tablet, Oral) Active. Linzess ( Capsule, Oral) Active. Loratadine (  Tablet, Oral) Active. Losartan Potassium (  Tablet, Oral) Active. Mulitivitamin Active. Oxybutynin Chloride ( /5ML Syrup, Oral) Active. Pantoprazole Sodium (  Tablet DR, Oral) Active. Systane (0.4-0.3% Solution, Ophthalmic) Active. TraMADol HCl (  Tablet, Oral) Active. Vitamin E Active.    Past Surgical History Carpal Tunnel Repair  right Inguinal Hernia Repair  open: right Thyroidectomy; Subtotal  right Tubal Ligation     Review of Systems  General Present- Weight Loss. Not Present- Chills, Fatigue, Fever, Memory Loss, Night Sweats and Weight Gain. Skin Present- Hair Loss. Not Present- Eczema, Hives, Itching, Lesions and Rash. HEENT Not Present- Dentures, Double Vision, Headache, Hearing Loss, Tinnitus and Visual Loss. Respiratory Not Present- Allergies, Chronic Cough, Coughing up blood, Shortness of breath at rest and Shortness of breath with exertion. Cardiovascular Not Present- Chest Pain, Difficulty Breathing Lying Down, Murmur, Palpitations, Racing/skipping heartbeats and Swelling. Gastrointestinal Not Present- Abdominal Pain, Bloody Stool, Constipation, Diarrhea, Difficulty Swallowing, Heartburn, Jaundice, Loss of appetitie, Nausea and Vomiting. Female Genitourinary Present- Incontinence. Not Present- Blood in Urine, Discharge, Flank Pain, Painful Urination, Urgency, Urinary frequency, Urinary Retention, Urinating at Night and Weak urinary stream. Musculoskeletal Present- Joint Pain, Joint Stiffness and Morning Stiffness. Not Present- Back Pain, Joint Swelling, Muscle Pain, Muscle Weakness  and Spasms. Neurological Not Present- Blackout spells, Difficulty with balance, Dizziness, Paralysis, Tremor and Weakness. Psychiatric Not Present- Insomnia.  Vitals Weight: 178 lb Height: 65in Weight was reported by patient. Height was reported by patient. Body Surface Area: 1.88 m Body Mass Index: 29.62 kg/m  Pulse: 58 (Regular)  BP: 128/70 (Sitting, Right Arm, Standard)    Physical Exam General Mental Status -Alert, cooperative and good historian. General Appearance-pleasant, Not in acute distress. Orientation-Oriented X3. Build & Nutrition-Well nourished and Well developed.  Head and Neck Head-normocephalic, atraumatic . Neck Global Assessment - supple, no bruit auscultated on the right, no bruit auscultated on the left.  Eye Pupil - Bilateral-Regular and Round. Motion - Bilateral-EOMI.  Chest and Lung Exam Auscultation Breath sounds - clear at anterior chest wall and clear at posterior chest wall. Adventitious sounds - No Adventitious sounds.  Cardiovascular Auscultation Rhythm - Regular rate and rhythm. Heart Sounds - S1 WNL and S2 WNL. Murmurs & Other Heart Sounds - Auscultation of the heart reveals - No Murmurs.  Abdomen Palpation/Percussion Tenderness - Abdomen is non-tender to palpation. Rigidity (guarding) - Abdomen is soft. Auscultation Auscultation of the abdomen reveals - Bowel sounds normal.  Female Genitourinary Note: Not done, not pertinent to present illness   Musculoskeletal Note: Evaluation of the left hip shows flexion to 120 rotation in 30 out 40 and abduction 40 without discomfort. There is no tenderness over the greater trochanter. There is no pain on provocative testing of the hip.Examination of the right hip shows flexion to 120 rotation in 30 abduction 40 and external rotation of 40. There is no tenderness over the greater trochanter. There is no pain on provocative testing of the hip. Evaluation of the RIGHT knee  shows no effusion. She has a slight varus deformity. Range is 0-130 with minimal crepitus on range of motion. She is very tender medially with no lateral tenderness or instability. LEFT knee shows no effusion with range 0-130. She is tender medial greater than lateral with  no instability noted. Pulses sensation and motor are intact both lower extremities. She has a significantly antalgic gait pattern.  Radiographs AP and lateral both knees from last month are reviewed and she does have near bone-on-bone change in the medial compartment of both knees. This has progressed compared to x-rays from last year.   Assessment & Plan  Primary osteoarthritis of left knee (M17.12) Primary osteoarthritis of right knee (M17.11)  Note:Surgical Plans: Left Unicompartmental Knee Replacement  Disposition: Home, Outpatient Therapy in [place] to start on Friday Oct. 12th.  PCP: Dr. Lodema Hong - Patient has been seen preoperatively and felt to be stable for surgery.  IV TXA  Anesthesia Issues: None  Patient was instructed on what medications to stop prior to surgery.  Signed electronically by Lauraine Rinne, III PA-C

## 2017-07-31 ENCOUNTER — Telehealth: Payer: Self-pay | Admitting: Family Medicine

## 2017-07-31 NOTE — Telephone Encounter (Signed)
Patient called to schedule her mammogram, scheduling told her the order was put in wrong and it could not be scheduled. Needs to be a diagnostic mammogram Patients requests morning appt after 08/31/17

## 2017-08-01 NOTE — Patient Instructions (Addendum)
Laura [name]  08/01/2017   Your procedure is scheduled on: 08-09-17   Report to [facility] Main  Entrance Report to Admitting at 3:15 PM   Call this number if you have problems the morning of surgery  [phone]   Remember: ONLY 1 PERSON MAY GO WITH YOU TO SHORT STAY TO GET  READY MORNING OF YOUR SURGERY.  Do not eat food or drink liquids :After Midnight. You may have a Clear Liquid Diet from Midnight until 9:45 AM. After 9:45 AM, nothing until after surgery.     CLEAR LIQUID DIET   Foods Allowed                                                                     Foods Excluded  Coffee and tea, regular and decaf                             liquids that you cannot  Plain Jell-O in any flavor                                             see through such as: Fruit ices (not with fruit pulp)                                     milk, soups, orange juice  Iced Popsicles                                    All solid food Carbonated beverages, regular and diet                                    Cranberry, grape and apple juices Sports drinks like Gatorade Lightly seasoned clear broth or consume(fat free) Sugar, honey syrup  Sample Menu Breakfast                                Lunch                                     Supper Cranberry juice                    Beef broth                            Chicken broth Jell-O                                     Grape juice  Apple juice Coffee or tea                        Jell-O                                      Popsicle                                                Coffee or tea                        Coffee or tea  _____________________________________________________________________     Take these medicines the morning of surgery with A SIP OF WATER: Atorvastatin (Lipitor), Citalopram (Celexa), Levothyroxine (Synthroid), and  Pantoprazole (Protonix)                                 You  may not have any metal on your body including hair pins and              piercings  Do not wear jewelry, make-up, lotions, powders or perfumes, deodorant             Do not wear nail polish.  Do not shave  48 hours prior to surgery.              Men may shave face and neck.   Do not bring valuables to the hospital.  IS NOT             RESPONSIBLE   FOR VALUABLES.  Contacts, dentures or bridgework may not be worn into surgery.  Leave suitcase in the car. After surgery it may be brought to your room.                Please read over the following fact sheets you were given: _____________________________________________________________________             [facility] - Preparing for Surgery Before surgery, you can play an important role.  Because skin is not sterile, your skin needs to be as free of germs as possible.  You can reduce the number of germs on your skin by washing with CHG (chlorahexidine gluconate) soap before surgery.  CHG is an antiseptic cleaner which kills germs and bonds with the skin to continue killing germs even after washing. Please DO NOT use if you have an allergy to CHG or antibacterial soaps.  If your skin becomes reddened/irritated stop using the CHG and inform your nurse when you arrive at Short Stay. Do not shave (including legs and underarms) for at least 48 hours prior to the first CHG shower.  You may shave your face/neck. Please follow these instructions carefully:  1.  Shower with CHG Soap the night before surgery and the  morning of Surgery.  2.  If you choose to wash your hair, wash your hair first as usual with your  normal  shampoo.  3.  After you shampoo, rinse your hair and body thoroughly to remove the  shampoo.                           4.  Use CHG as you would any other liquid soap.  You can apply chg directly  to the skin and wash                       Gently with a scrungie or clean washcloth.  5.  Apply the CHG Soap to your body ONLY  FROM THE NECK DOWN.   Do not use on face/ open                           Wound or open sores. Avoid contact with eyes, ears mouth and genitals (private parts).                       Wash face,  Genitals (private parts) with your normal soap.             6.  Wash thoroughly, paying special attention to the area where your surgery  will be performed.  7.  Thoroughly rinse your body with warm water from the neck down.  8.  DO NOT shower/wash with your normal soap after using and rinsing off  the CHG Soap.                9.  Pat yourself dry with a clean towel.            10.  Wear clean pajamas.            11.  Place clean sheets on your bed the night of your first shower and do not  sleep with pets. Day of Surgery : Do not apply any lotions/deodorants the morning of surgery.  Please wear clean clothes to the hospital/surgery center.  FAILURE TO FOLLOW THESE INSTRUCTIONS MAY RESULT IN THE CANCELLATION OF YOUR SURGERY PATIENT SIGNATURE_________________________________  NURSE SIGNATURE__________________________________  ________________________________________________________________________   Laura [name]  An incentive spirometer is a tool that can help keep your lungs clear and active. This tool measures how well you are filling your lungs with each breath. Taking long deep breaths may help reverse or decrease the chance of developing breathing (pulmonary) problems (especially infection) following:  A long period of time when you are unable to move or be active. BEFORE THE PROCEDURE   If the spirometer includes an indicator to show your best effort, your nurse or respiratory therapist will set it to a desired goal.  If possible, sit up straight or lean slightly forward. Try not to slouch.  Hold the incentive spirometer in an upright position. INSTRUCTIONS FOR USE  1. Sit on the edge of your bed if possible, or sit up as far as you can in bed or on a chair. 2. Hold the incentive  spirometer in an upright position. 3. Breathe out normally. 4. Place the mouthpiece in your mouth and seal your lips tightly around it. 5. Breathe in slowly and as deeply as possible, raising the piston or the ball toward the top of the column. 6. Hold your breath for 3-5 seconds or for as long as possible. Allow the piston or ball to fall to the bottom of the column. 7. Remove the mouthpiece from your mouth and breathe out normally. 8. Rest for a few seconds and repeat Steps 1 through 7 at least 10 times every 1-2 hours when you are awake. Take your time and take a few normal breaths between deep breaths. 9. The spirometer may include an indicator to show your  best effort. Use the indicator as a goal to work toward during each repetition. 10. After each set of 10 deep breaths, practice coughing to be sure your lungs are clear. If you have an incision (the cut made at the time of surgery), support your incision when coughing by placing a pillow or rolled up towels firmly against it. Once you are able to get out of bed, walk around indoors and cough well. You may stop using the incentive spirometer when instructed by your caregiver.  RISKS AND COMPLICATIONS  Take your time so you do not get dizzy or light-headed.  If you are in pain, you may need to take or ask for pain medication before doing incentive spirometry. It is harder to take a deep breath if you are having pain. AFTER USE  Rest and breathe slowly and easily.  It can be helpful to keep track of a log of your progress. Your caregiver can provide you with a simple table to help with this. If you are using the spirometer at home, follow these instructions: SEEK MEDICAL CARE IF:   You are having difficultly using the spirometer.  You have trouble using the spirometer as often as instructed.  Your pain medication is not giving enough relief while using the spirometer.  You develop fever of 100.5 F (38.1 C) or higher. SEEK  IMMEDIATE MEDICAL CARE IF:   You cough up bloody sputum that had not been present before.  You develop fever of 102 F (38.9 C) or greater.  You develop worsening pain at or near the incision site. MAKE SURE YOU:   Understand these instructions.  Will watch your condition.  Will get help right away if you are not doing well or get worse. Document Released: 02/27/2007 Document Revised: 01/09/2012 Document Reviewed: 04/30/2007 Exit[employer] Patient Information 2014 ExitCare, [place].   ________________________________________________________________________  WHAT IS A BLOOD TRANSFUSION? Blood Transfusion Information  A transfusion is the replacement of blood or some of its parts. Blood is made up of multiple cells which provide different functions.  Red blood cells carry oxygen and are used for blood loss replacement.  White blood cells fight against infection.  Platelets control bleeding.  Plasma helps clot blood.  Other blood products are available for specialized needs, such as hemophilia or other clotting disorders. BEFORE THE TRANSFUSION  Who gives blood for transfusions?   Healthy volunteers who are fully evaluated to make sure their blood is safe. This is blood bank blood. Transfusion therapy is the safest it has ever been in the practice of medicine. Before blood is taken from a donor, a complete history is taken to make sure that person has no history of diseases nor engages in risky social behavior (examples are intravenous drug use or sexual activity with multiple partners). The donor's travel history is screened to minimize risk of transmitting infections, such as malaria. The donated blood is tested for signs of infectious diseases, such as HIV and hepatitis. The blood is then tested to be sure it is compatible with you in order to minimize the chance of a transfusion reaction. If you or a relative donates blood, this is often done in anticipation of surgery and is not  appropriate for emergency situations. It takes many days to process the donated blood. RISKS AND COMPLICATIONS Although transfusion therapy is very safe and saves many lives, the main dangers of transfusion include:   Getting an infectious disease.  Developing a transfusion reaction. This is an allergic reaction to something  in the blood you were given. Every precaution is taken to prevent this. The decision to have a blood transfusion has been considered carefully by your caregiver before blood is given. Blood is not given unless the benefits outweigh the risks. AFTER THE TRANSFUSION  Right after receiving a blood transfusion, you will usually feel much better and more energetic. This is especially true if your red blood cells have gotten low (anemic). The transfusion raises the level of the red blood cells which carry oxygen, and this usually causes an energy increase.  The nurse administering the transfusion will monitor you carefully for complications. HOME CARE INSTRUCTIONS  No special instructions are needed after a transfusion. You may find your energy is better. Speak with your caregiver about any limitations on activity for underlying diseases you may have. SEEK MEDICAL CARE IF:   Your condition is not improving after your transfusion.  You develop redness or irritation at the intravenous (IV) site. SEEK IMMEDIATE MEDICAL CARE IF:  Any of the following symptoms occur over the next 12 hours:  Shaking chills.  You have a temperature by mouth above 102 F (38.9 C), not controlled by medicine.  Chest, back, or muscle pain.  People around you feel you are not acting correctly or are confused.  Shortness of breath or difficulty breathing.  Dizziness and fainting.  You get a rash or develop hives.  You have a decrease in urine output.  Your urine turns a dark color or changes to pink, red, or brown. Any of the following symptoms occur over the next 10 days:  You have a  temperature by mouth above 102 F (38.9 C), not controlled by medicine.  Shortness of breath.  Weakness after normal activity.  The white part of the eye turns yellow (jaundice).  You have a decrease in the amount of urine or are urinating less often.  Your urine turns a dark color or changes to pink, red, or brown. Document Released: 10/14/2000 Document Revised: 01/09/2012 Document Reviewed: 06/02/2008 [facility] Patient Information 2014 [place], [place].  _______________________________________________________________________

## 2017-08-01 NOTE — Progress Notes (Signed)
06-21-17 Surgical clearance from Dr. Lodema Hong on chart.  06-21-17 (EPIC) EKG, CXR

## 2017-08-03 ENCOUNTER — Encounter (HOSPITAL_COMMUNITY): Payer: Self-pay

## 2017-08-03 ENCOUNTER — Encounter (INDEPENDENT_AMBULATORY_CARE_PROVIDER_SITE_OTHER): Payer: Self-pay

## 2017-08-03 ENCOUNTER — Encounter (HOSPITAL_COMMUNITY)
Admission: RE | Admit: 2017-08-03 | Discharge: 2017-08-03 | Disposition: A | Discharge status: Home / Self Care | Payer: Medicare Other | Source: Ambulatory Visit | Attending: Orthopedic Surgery | Admitting: Orthopedic Surgery

## 2017-08-03 DIAGNOSIS — Z01818 Encounter for other preprocedural examination: Secondary | ICD-10-CM | POA: Insufficient documentation

## 2017-08-03 DIAGNOSIS — M1712 Unilateral primary osteoarthritis, left knee: Secondary | ICD-10-CM | POA: Diagnosis not present

## 2017-08-03 LAB — COMPREHENSIVE METABOLIC PANEL
ALBUMIN: 3.9 g/dL (ref 3.5–5.0)
ALK PHOS: 71 U/L (ref 38–126)
ALT: 17 U/L (ref 14–54)
AST: 22 U/L (ref 15–41)
Anion gap: 6 (ref 5–15)
BILIRUBIN TOTAL: 0.7 mg/dL (ref 0.3–1.2)
BUN: 9 mg/dL (ref 6–20)
CALCIUM: 9.7 mg/dL (ref 8.9–10.3)
CO2: 31 mmol/L (ref 22–32)
Chloride: 102 mmol/L (ref 101–111)
Creatinine, Ser: 0.86 mg/dL (ref 0.44–1.00)
GFR calc Af Amer: >60 mL/min (ref >60)
GFR calc non Af Amer: >60 mL/min (ref >60)
GLUCOSE: 100 mg/dL — AB (ref 65–99)
POTASSIUM: 3.2 mmol/L — AB (ref 3.5–5.1)
Sodium: 139 mmol/L (ref 135–145)
TOTAL PROTEIN: 7.2 g/dL (ref 6.5–8.1)

## 2017-08-03 LAB — APTT: APTT: 34 s (ref 24–36)

## 2017-08-03 LAB — CBC
HEMATOCRIT: 33.1 % — AB (ref 36.0–46.0)
HEMOGLOBIN: 10.7 g/dL — AB (ref 12.0–15.0)
MCH: 26.8 pg (ref 26.0–34.0)
MCHC: 32.3 g/dL (ref 30.0–36.0)
MCV: 83 fL (ref 78.0–100.0)
Platelets: 275 10*3/uL (ref 150–400)
RBC: 3.99 MIL/uL (ref 3.87–5.11)
RDW: 12.6 % (ref 11.5–15.5)
WBC: 3.6 10*3/uL — AB (ref 4.0–10.5)

## 2017-08-03 LAB — PROTIME-INR
INR: 0.9
Prothrombin Time: 12.1 seconds (ref 11.4–15.2)

## 2017-08-03 LAB — SURGICAL PCR SCREEN
MRSA, PCR: NEGATIVE
STAPHYLOCOCCUS AUREUS: NEGATIVE

## 2017-08-03 LAB — ABO/RH: ABO/RH(D): O POS

## 2017-08-04 ENCOUNTER — Telehealth: Payer: Self-pay

## 2017-08-04 NOTE — Telephone Encounter (Signed)
pts Linzess has arrived from patient assistance. I tried to call the pt- [phone] asking her to come by and pick it up. Medication is at the front desk.

## 2017-08-09 ENCOUNTER — Ambulatory Visit (HOSPITAL_COMMUNITY): Payer: Medicare Other | Admitting: Certified Registered Nurse Anesthetist

## 2017-08-09 ENCOUNTER — Observation Stay (HOSPITAL_COMMUNITY)
Admission: RE | Admit: 2017-08-09 | Discharge: 2017-08-10 | Disposition: A | Discharge status: Home / Self Care | Payer: Medicare Other | Source: Ambulatory Visit | Attending: Orthopedic Surgery | Admitting: Orthopedic Surgery

## 2017-08-09 ENCOUNTER — Encounter (HOSPITAL_COMMUNITY)
Admission: RE | Disposition: A | Discharge status: Home / Self Care | Payer: Self-pay | Source: Ambulatory Visit | Attending: Orthopedic Surgery

## 2017-08-09 ENCOUNTER — Encounter (HOSPITAL_COMMUNITY): Payer: Self-pay | Admitting: *Deleted

## 2017-08-09 DIAGNOSIS — K219 Gastro-esophageal reflux disease without esophagitis: Secondary | ICD-10-CM | POA: Insufficient documentation

## 2017-08-09 DIAGNOSIS — E7800 Pure hypercholesterolemia, unspecified: Secondary | ICD-10-CM | POA: Diagnosis not present

## 2017-08-09 DIAGNOSIS — M8580 Other specified disorders of bone density and structure, unspecified site: Secondary | ICD-10-CM | POA: Diagnosis not present

## 2017-08-09 DIAGNOSIS — F329 Major depressive disorder, single episode, unspecified: Secondary | ICD-10-CM | POA: Insufficient documentation

## 2017-08-09 DIAGNOSIS — K589 Irritable bowel syndrome without diarrhea: Secondary | ICD-10-CM | POA: Diagnosis not present

## 2017-08-09 DIAGNOSIS — Z888 Allergy status to other drugs, medicaments and biological substances status: Secondary | ICD-10-CM | POA: Insufficient documentation

## 2017-08-09 DIAGNOSIS — M170 Bilateral primary osteoarthritis of knee: Secondary | ICD-10-CM | POA: Diagnosis not present

## 2017-08-09 DIAGNOSIS — Z87891 Personal history of nicotine dependence: Secondary | ICD-10-CM | POA: Diagnosis not present

## 2017-08-09 DIAGNOSIS — H04123 Dry eye syndrome of bilateral lacrimal glands: Secondary | ICD-10-CM | POA: Insufficient documentation

## 2017-08-09 DIAGNOSIS — Z885 Allergy status to narcotic agent status: Secondary | ICD-10-CM | POA: Insufficient documentation

## 2017-08-09 DIAGNOSIS — M1710 Unilateral primary osteoarthritis, unspecified knee: Secondary | ICD-10-CM | POA: Diagnosis present

## 2017-08-09 DIAGNOSIS — Z79899 Other long term (current) drug therapy: Secondary | ICD-10-CM | POA: Insufficient documentation

## 2017-08-09 DIAGNOSIS — G8918 Other acute postprocedural pain: Secondary | ICD-10-CM | POA: Diagnosis not present

## 2017-08-09 DIAGNOSIS — Z7901 Long term (current) use of anticoagulants: Secondary | ICD-10-CM | POA: Insufficient documentation

## 2017-08-09 DIAGNOSIS — I10 Essential (primary) hypertension: Secondary | ICD-10-CM | POA: Diagnosis not present

## 2017-08-09 DIAGNOSIS — Z8719 Personal history of other diseases of the digestive system: Secondary | ICD-10-CM | POA: Insufficient documentation

## 2017-08-09 DIAGNOSIS — E785 Hyperlipidemia, unspecified: Secondary | ICD-10-CM | POA: Insufficient documentation

## 2017-08-09 DIAGNOSIS — E039 Hypothyroidism, unspecified: Secondary | ICD-10-CM | POA: Diagnosis not present

## 2017-08-09 DIAGNOSIS — F419 Anxiety disorder, unspecified: Secondary | ICD-10-CM | POA: Diagnosis not present

## 2017-08-09 DIAGNOSIS — M25762 Osteophyte, left knee: Secondary | ICD-10-CM | POA: Insufficient documentation

## 2017-08-09 DIAGNOSIS — Z881 Allergy status to other antibiotic agents status: Secondary | ICD-10-CM | POA: Insufficient documentation

## 2017-08-09 DIAGNOSIS — M1712 Unilateral primary osteoarthritis, left knee: Secondary | ICD-10-CM | POA: Diagnosis not present

## 2017-08-09 DIAGNOSIS — M179 Osteoarthritis of knee, unspecified: Secondary | ICD-10-CM | POA: Diagnosis present

## 2017-08-09 HISTORY — PX: PARTIAL KNEE ARTHROPLASTY: SHX2174

## 2017-08-09 LAB — TYPE AND SCREEN
ABO/RH(D): O POS
ANTIBODY SCREEN: NEGATIVE

## 2017-08-09 SURGERY — ARTHROPLASTY, KNEE, UNICOMPARTMENTAL
Anesthesia: Spinal | Laterality: Left

## 2017-08-09 MED ORDER — OXYCODONE HCL 5 MG PO TABS
5.0000 mg | ORAL_TABLET | ORAL | Status: DC | PRN
  Administered 2017-08-10 (×2): 10 mg via ORAL
  Filled 2017-08-09 (×2): qty 2

## 2017-08-09 MED ORDER — MORPHINE SULFATE (PF) 4 MG/ML IV SOLN
1.0000 mg | INTRAVENOUS | Status: DC | PRN
  Administered 2017-08-09: 1 mg via INTRAVENOUS
  Filled 2017-08-09: qty 1

## 2017-08-09 MED ORDER — MIDAZOLAM HCL 2 MG/2ML IJ SOLN
INTRAMUSCULAR | Status: AC
  Administered 2017-08-09: 2 mg via INTRAVENOUS
  Filled 2017-08-09: qty 2

## 2017-08-09 MED ORDER — LEVOTHYROXINE SODIUM 112 MCG PO TABS
112.0000 ug | ORAL_TABLET | Freq: Every day | ORAL | Status: DC
  Administered 2017-08-10: 112 ug via ORAL
  Filled 2017-08-09: qty 1

## 2017-08-09 MED ORDER — CEFAZOLIN SODIUM-DEXTROSE 2-4 GM/100ML-% IV SOLN
2.0000 g | Freq: Four times a day (QID) | INTRAVENOUS | Status: AC
  Administered 2017-08-10 (×2): 2 g via INTRAVENOUS
  Filled 2017-08-09 (×2): qty 100

## 2017-08-09 MED ORDER — BISACODYL 10 MG RE SUPP: 10.0000 mg | Freq: Every day | RECTAL | Status: DC | PRN

## 2017-08-09 MED ORDER — EPHEDRINE SULFATE 50 MG/ML IJ SOLN
INTRAMUSCULAR | Status: DC | PRN
  Administered 2017-08-09: 10 mg via INTRAVENOUS

## 2017-08-09 MED ORDER — RIVAROXABAN 10 MG PO TABS
10.0000 mg | ORAL_TABLET | Freq: Every day | ORAL | Status: DC
  Administered 2017-08-10: 10 mg via ORAL
  Filled 2017-08-09: qty 1

## 2017-08-09 MED ORDER — FENTANYL CITRATE (PF) 100 MCG/2ML IJ SOLN: 25.0000 ug | INTRAMUSCULAR | Status: DC | PRN

## 2017-08-09 MED ORDER — ACETAMINOPHEN 325 MG PO TABS: 650.0000 mg | ORAL_TABLET | Freq: Four times a day (QID) | ORAL | Status: DC | PRN

## 2017-08-09 MED ORDER — FENTANYL CITRATE (PF) 100 MCG/2ML IJ SOLN
INTRAMUSCULAR | Status: AC
  Filled 2017-08-09: qty 2

## 2017-08-09 MED ORDER — FLEET ENEMA 7-19 GM/118ML RE ENEM: 1.0000 | ENEMA | Freq: Once | RECTAL | Status: DC | PRN

## 2017-08-09 MED ORDER — ROPIVACAINE HCL 7.5 MG/ML IJ SOLN
INTRAMUSCULAR | Status: DC | PRN
  Administered 2017-08-09: 20 mL via PERINEURAL

## 2017-08-09 MED ORDER — LINACLOTIDE 72 MCG PO CAPS
72.0000 ug | ORAL_CAPSULE | Freq: Every day | ORAL | Status: DC
  Administered 2017-08-10: 72 ug via ORAL
  Filled 2017-08-09: qty 1

## 2017-08-09 MED ORDER — DEXAMETHASONE SODIUM PHOSPHATE 10 MG/ML IJ SOLN
10.0000 mg | Freq: Once | INTRAMUSCULAR | Status: AC
  Administered 2017-08-10: 10 mg via INTRAVENOUS
  Filled 2017-08-09: qty 1

## 2017-08-09 MED ORDER — ONDANSETRON HCL 4 MG PO TABS: 4.0000 mg | ORAL_TABLET | Freq: Four times a day (QID) | ORAL | Status: DC | PRN

## 2017-08-09 MED ORDER — ACETAMINOPHEN 500 MG PO TABS
1000.0000 mg | ORAL_TABLET | Freq: Four times a day (QID) | ORAL | Status: DC
  Administered 2017-08-09 – 2017-08-10 (×3): 1000 mg via ORAL
  Filled 2017-08-09 (×3): qty 2

## 2017-08-09 MED ORDER — METOCLOPRAMIDE HCL 5 MG/ML IJ SOLN: 10.0000 mg | Freq: Once | INTRAMUSCULAR | Status: DC | PRN

## 2017-08-09 MED ORDER — METOCLOPRAMIDE HCL 5 MG/ML IJ SOLN: 5.0000 mg | Freq: Three times a day (TID) | INTRAMUSCULAR | Status: DC | PRN

## 2017-08-09 MED ORDER — LACTATED RINGERS IV SOLN
INTRAVENOUS | Status: DC
  Administered 2017-08-09 (×2): via INTRAVENOUS

## 2017-08-09 MED ORDER — PANTOPRAZOLE SODIUM 40 MG PO TBEC
40.0000 mg | DELAYED_RELEASE_TABLET | Freq: Two times a day (BID) | ORAL | Status: DC
  Administered 2017-08-09 – 2017-08-10 (×2): 40 mg via ORAL
  Filled 2017-08-09 (×2): qty 1

## 2017-08-09 MED ORDER — AZELASTINE HCL 0.1 % NA SOLN: 2.0000 | Freq: Two times a day (BID) | NASAL | Status: DC | PRN

## 2017-08-09 MED ORDER — SODIUM CHLORIDE 0.9 % IJ SOLN
INTRAMUSCULAR | Status: AC
  Filled 2017-08-09: qty 50

## 2017-08-09 MED ORDER — PROPOFOL 500 MG/50ML IV EMUL
INTRAVENOUS | Status: DC | PRN
  Administered 2017-08-09: 75 ug/kg/min via INTRAVENOUS

## 2017-08-09 MED ORDER — METHOCARBAMOL 1000 MG/10ML IJ SOLN
500.0000 mg | Freq: Four times a day (QID) | INTRAVENOUS | Status: DC | PRN
  Administered 2017-08-09: 500 mg via INTRAVENOUS
  Filled 2017-08-09: qty 550

## 2017-08-09 MED ORDER — PROPOFOL 10 MG/ML IV BOLUS
INTRAVENOUS | Status: DC | PRN
  Administered 2017-08-09 (×2): 20 mg via INTRAVENOUS

## 2017-08-09 MED ORDER — POLYETHYLENE GLYCOL 3350 17 G PO PACK: 17.0000 g | PACK | Freq: Every day | ORAL | Status: DC | PRN

## 2017-08-09 MED ORDER — CEFAZOLIN SODIUM-DEXTROSE 2-4 GM/100ML-% IV SOLN
2.0000 g | INTRAVENOUS | Status: AC
  Administered 2017-08-09: 2 g via INTRAVENOUS

## 2017-08-09 MED ORDER — SODIUM CHLORIDE 0.9 % IR SOLN
Status: DC | PRN
  Administered 2017-08-09: 1000 mL

## 2017-08-09 MED ORDER — FENTANYL CITRATE (PF) 100 MCG/2ML IJ SOLN
INTRAMUSCULAR | Status: AC
  Administered 2017-08-09: 100 ug via INTRAVENOUS
  Filled 2017-08-09: qty 2

## 2017-08-09 MED ORDER — MENTHOL 3 MG MT LOZG: 1.0000 | LOZENGE | OROMUCOSAL | Status: DC | PRN

## 2017-08-09 MED ORDER — ONDANSETRON HCL 4 MG/2ML IJ SOLN
INTRAMUSCULAR | Status: AC
  Filled 2017-08-09: qty 2

## 2017-08-09 MED ORDER — BUPIVACAINE LIPOSOME 1.3 % IJ SUSP
INTRAMUSCULAR | Status: DC | PRN
  Administered 2017-08-09: 70 mL

## 2017-08-09 MED ORDER — ONDANSETRON HCL 4 MG/2ML IJ SOLN: 4.0000 mg | Freq: Four times a day (QID) | INTRAMUSCULAR | Status: DC | PRN

## 2017-08-09 MED ORDER — LOSARTAN POTASSIUM 50 MG PO TABS
50.0000 mg | ORAL_TABLET | Freq: Every day | ORAL | Status: DC
  Administered 2017-08-10: 50 mg via ORAL
  Filled 2017-08-09: qty 1

## 2017-08-09 MED ORDER — 0.9 % SODIUM CHLORIDE (POUR BTL) OPTIME
TOPICAL | Status: DC | PRN
  Administered 2017-08-09: 1000 mL

## 2017-08-09 MED ORDER — PROPOFOL 10 MG/ML IV BOLUS
INTRAVENOUS | Status: AC
  Filled 2017-08-09: qty 40

## 2017-08-09 MED ORDER — FENTANYL CITRATE (PF) 100 MCG/2ML IJ SOLN
100.0000 ug | Freq: Once | INTRAMUSCULAR | Status: AC
  Administered 2017-08-09: 100 ug via INTRAVENOUS

## 2017-08-09 MED ORDER — METOCLOPRAMIDE HCL 5 MG PO TABS: 5.0000 mg | ORAL_TABLET | Freq: Three times a day (TID) | ORAL | Status: DC | PRN

## 2017-08-09 MED ORDER — TRANEXAMIC ACID 1000 MG/10ML IV SOLN
1000.0000 mg | INTRAVENOUS | Status: AC
  Administered 2017-08-09: 1000 mg via INTRAVENOUS
  Filled 2017-08-09: qty 1100

## 2017-08-09 MED ORDER — SODIUM CHLORIDE 0.9 % IJ SOLN
INTRAMUSCULAR | Status: AC
  Filled 2017-08-09: qty 10

## 2017-08-09 MED ORDER — MEPERIDINE HCL 50 MG/ML IJ SOLN: 6.2500 mg | INTRAMUSCULAR | Status: DC | PRN

## 2017-08-09 MED ORDER — BUPIVACAINE LIPOSOME 1.3 % IJ SUSP
20.0000 mL | Freq: Once | INTRAMUSCULAR | Status: DC
  Filled 2017-08-09: qty 20

## 2017-08-09 MED ORDER — DEXAMETHASONE SODIUM PHOSPHATE 10 MG/ML IJ SOLN
INTRAMUSCULAR | Status: AC
  Filled 2017-08-09: qty 1

## 2017-08-09 MED ORDER — BUPIVACAINE IN DEXTROSE 0.75-8.25 % IT SOLN
INTRATHECAL | Status: DC | PRN
  Administered 2017-08-09: 1.8 mL via INTRATHECAL

## 2017-08-09 MED ORDER — CEFAZOLIN SODIUM-DEXTROSE 2-4 GM/100ML-% IV SOLN
INTRAVENOUS | Status: AC
  Filled 2017-08-09: qty 100

## 2017-08-09 MED ORDER — METHOCARBAMOL 500 MG PO TABS: 500.0000 mg | ORAL_TABLET | Freq: Four times a day (QID) | ORAL | Status: DC | PRN

## 2017-08-09 MED ORDER — ONDANSETRON HCL 4 MG/2ML IJ SOLN
INTRAMUSCULAR | Status: DC | PRN
  Administered 2017-08-09: 4 mg via INTRAVENOUS

## 2017-08-09 MED ORDER — HYDROCHLOROTHIAZIDE 25 MG PO TABS
25.0000 mg | ORAL_TABLET | Freq: Every day | ORAL | Status: DC
  Administered 2017-08-10: 25 mg via ORAL
  Filled 2017-08-09: qty 1

## 2017-08-09 MED ORDER — CHLORHEXIDINE GLUCONATE 4 % EX LIQD: 60.0000 mL | Freq: Once | CUTANEOUS | Status: DC

## 2017-08-09 MED ORDER — ACETAMINOPHEN 650 MG RE SUPP: 650.0000 mg | Freq: Four times a day (QID) | RECTAL | Status: DC | PRN

## 2017-08-09 MED ORDER — PHENOL 1.4 % MT LIQD: 1.0000 | OROMUCOSAL | Status: DC | PRN

## 2017-08-09 MED ORDER — GABAPENTIN 100 MG PO CAPS: 100.0000 mg | ORAL_CAPSULE | Freq: Every day | ORAL | Status: DC | PRN

## 2017-08-09 MED ORDER — PROPOFOL 10 MG/ML IV BOLUS
INTRAVENOUS | Status: AC
  Filled 2017-08-09: qty 20

## 2017-08-09 MED ORDER — DIPHENHYDRAMINE HCL 12.5 MG/5ML PO ELIX: 12.5000 mg | ORAL_SOLUTION | ORAL | Status: DC | PRN

## 2017-08-09 MED ORDER — DOCUSATE SODIUM 100 MG PO CAPS
100.0000 mg | ORAL_CAPSULE | Freq: Two times a day (BID) | ORAL | Status: DC
  Administered 2017-08-09 – 2017-08-10 (×2): 100 mg via ORAL
  Filled 2017-08-09 (×2): qty 1

## 2017-08-09 MED ORDER — MIDAZOLAM HCL 2 MG/2ML IJ SOLN
2.0000 mg | Freq: Once | INTRAMUSCULAR | Status: AC
  Administered 2017-08-09: 2 mg via INTRAVENOUS

## 2017-08-09 MED ORDER — LORATADINE 10 MG PO TABS: 10.0000 mg | ORAL_TABLET | Freq: Every day | ORAL | Status: DC | PRN

## 2017-08-09 MED ORDER — CITALOPRAM HYDROBROMIDE 20 MG PO TABS
10.0000 mg | ORAL_TABLET | Freq: Every day | ORAL | Status: DC
  Administered 2017-08-10: 10 mg via ORAL
  Filled 2017-08-09: qty 1

## 2017-08-09 MED ORDER — MIDAZOLAM HCL 2 MG/2ML IJ SOLN
INTRAMUSCULAR | Status: AC
  Filled 2017-08-09: qty 2

## 2017-08-09 MED ORDER — ATORVASTATIN CALCIUM 20 MG PO TABS
20.0000 mg | ORAL_TABLET | Freq: Every day | ORAL | Status: DC
  Administered 2017-08-10: 20 mg via ORAL
  Filled 2017-08-09: qty 1

## 2017-08-09 MED ORDER — LACTATED RINGERS IV SOLN: INTRAVENOUS | Status: DC

## 2017-08-09 MED ORDER — ACETAMINOPHEN 10 MG/ML IV SOLN
INTRAVENOUS | Status: AC
  Filled 2017-08-09: qty 100

## 2017-08-09 MED ORDER — SODIUM CHLORIDE 0.9 % IV SOLN
INTRAVENOUS | Status: DC
  Administered 2017-08-09: 23:00:00 via INTRAVENOUS

## 2017-08-09 MED ORDER — BUPIVACAINE HCL 0.25 % IJ SOLN
INTRAMUSCULAR | Status: AC
  Filled 2017-08-09: qty 1

## 2017-08-09 MED ORDER — ACETAMINOPHEN 10 MG/ML IV SOLN
1000.0000 mg | Freq: Once | INTRAVENOUS | Status: AC
  Administered 2017-08-09: 1000 mg via INTRAVENOUS

## 2017-08-09 MED ORDER — DEXAMETHASONE SODIUM PHOSPHATE 10 MG/ML IJ SOLN
10.0000 mg | Freq: Once | INTRAMUSCULAR | Status: AC
  Administered 2017-08-09: 10 mg via INTRAVENOUS

## 2017-08-09 SURGICAL SUPPLY — 46 items
BAG DECANTER FOR FLEXI CONT (MISCELLANEOUS) ×2 IMPLANT
BAG ZIPLOCK 12X15 (MISCELLANEOUS) IMPLANT
BANDAGE ACE 6X5 VEL STRL LF (GAUZE/BANDAGES/DRESSINGS) ×2 IMPLANT
BANDAGE ELASTIC 6 VELCRO ST LF (GAUZE/BANDAGES/DRESSINGS) ×2 IMPLANT
BLADE SAW RECIPROCATING 77.5 (BLADE) ×2 IMPLANT
BLADE SAW SGTL 13.0X1.19X90.0M (BLADE) ×2 IMPLANT
BOWL SMART MIX CTS (DISPOSABLE) ×2 IMPLANT
BUR OVAL CARBIDE 4.0 (BURR) ×2 IMPLANT
CAPT KNEE PARTIAL 2 ×1 IMPLANT
CEMENT HV SMART SET (Cement) ×4 IMPLANT
CLOTH BEACON ORANGE TIMEOUT ST (SAFETY) ×2 IMPLANT
COVER SURGICAL LIGHT HANDLE (MISCELLANEOUS) ×2 IMPLANT
CUFF TOURN SGL QUICK 34 (TOURNIQUET CUFF) ×1
CUFF TRNQT CYL 34X4X40X1 (TOURNIQUET CUFF) ×1 IMPLANT
DRSG ADAPTIC 3X8 NADH LF (GAUZE/BANDAGES/DRESSINGS) ×2 IMPLANT
DRSG PAD ABDOMINAL 8X10 ST (GAUZE/BANDAGES/DRESSINGS) ×2 IMPLANT
DURAPREP 26ML APPLICATOR (WOUND CARE) ×2 IMPLANT
ELECT REM PT RETURN 15FT ADLT (MISCELLANEOUS) ×2 IMPLANT
EVACUATOR 1/8 PVC DRAIN (DRAIN) ×2 IMPLANT
GAUZE SPONGE 4X4 12PLY STRL (GAUZE/BANDAGES/DRESSINGS) ×2 IMPLANT
GLOVE BIO SURGEON STRL SZ7.5 (GLOVE) ×2 IMPLANT
GLOVE BIO SURGEON STRL SZ8 (GLOVE) ×2 IMPLANT
GLOVE BIOGEL PI IND STRL 8 (GLOVE) ×2 IMPLANT
GLOVE BIOGEL PI INDICATOR 8 (GLOVE) ×2
GOWN STRL REUS W/TWL LRG LVL3 (GOWN DISPOSABLE) ×2 IMPLANT
GOWN STRL REUS W/TWL XL LVL3 (GOWN DISPOSABLE) ×2 IMPLANT
HANDPIECE INTERPULSE COAX TIP (DISPOSABLE) ×1
IMMOBILIZER KNEE 20 (SOFTGOODS) ×4 IMPLANT
IMMOBILIZER KNEE 20 THIGH 36 (SOFTGOODS) ×1 IMPLANT
KIT IMPL STRL TIB IPOLY IUNI ×1 IMPLANT
MANIFOLD NEPTUNE II (INSTRUMENTS) ×2 IMPLANT
PACK TOTAL KNEE CUSTOM (KITS) ×2 IMPLANT
PAD ABD 7.5X8 STRL (GAUZE/BANDAGES/DRESSINGS) ×2 IMPLANT
PADDING CAST ABS 6INX4YD NS (CAST SUPPLIES) ×1
PADDING CAST ABS COTTON 6X4 NS (CAST SUPPLIES) ×1 IMPLANT
PADDING CAST COTTON 6X4 STRL (CAST SUPPLIES) ×4 IMPLANT
POSITIONER SURGICAL ARM (MISCELLANEOUS) ×2 IMPLANT
SET HNDPC FAN SPRY TIP SCT (DISPOSABLE) ×1 IMPLANT
STRIP CLOSURE SKIN 1/2X4 (GAUZE/BANDAGES/DRESSINGS) ×2 IMPLANT
SUT MNCRL AB 4-0 PS2 18 (SUTURE) ×2 IMPLANT
SUT STRATAFIX 0 PDS 27 VIOLET (SUTURE) ×2
SUT VIC AB 2-0 CT1 27 (SUTURE) ×2
SUT VIC AB 2-0 CT1 TAPERPNT 27 (SUTURE) ×2 IMPLANT
SUTURE STRATFX 0 PDS 27 VIOLET (SUTURE) ×1 IMPLANT
SYR 50ML LL SCALE MARK (SYRINGE) ×2 IMPLANT
WRAP KNEE MAXI GEL POST OP (GAUZE/BANDAGES/DRESSINGS) ×2 IMPLANT

## 2017-08-09 NOTE — H&P (View-Only) (Signed)
Laura [name] DOB: 05/01/1951 Married / Language: [place] / Race: Black or African American Female Date of Admission: August 09, 2017  Chief complaint: Left knee pain History of Present Illness  The patient is a 66 year old female who comes in for a preoperative History and Physical. The patient is scheduled for a left unicompartmental knee arthroplasty to be performed by Dr. Gus Rankin. Aluisio, MD at [facility] on 08-09-2017. The patient was seen in referral from Dr. Ranell Patrick. The patient reports left knee and right knee symptoms including: pain, swelling, stiffness and soreness which began month(s) ago without any known injury. The patient describes the severity of the symptoms as moderate in severity. The patient feels that the symptoms are worsening. Symptoms are reported to be located in the left anterior knee, left posterior knee, right anterior knee and right posterior knee and include knee pain, swelling, stiffness, difficulty bearing weight and difficulty ambulating. The patient does not report any radiation of symptoms. Symptoms are exacerbated by motion at the knee, weight bearing and walking. Symptoms are relieved by rest, use of a brace (on the right knee) and non-opioid analgesics (tramadol prn). She has significant pain and dysfunction in both knees. She has had a previous arthroscopy and RIGHT knee showing medial compartment degenerative changes. The problem has gotten much worse in the past 6-9 months. She is now hurting at all times. All of her pain is medial in both knees. She is not having any swelling in the knees. They are not giving out on her. Dr. Ranell Patrick has evaluated her and had x-rays done last fall and then repeated 2 weeks ago showing a progressive medial compartment degenerative change in both knees. She did not have lateral or patellofemoral involvement. It is felt hat she could benefit from undergoing a partial knee replacement at this time. The patient is at  the stage now where she is ready to go ahead and have a more permanent solution to her knee problem. Injections have also not been beneficial. They have been treated conservatively in the past for the above stated problem and despite conservative measures, they continue to have progressive pain and severe functional limitations and dysfunction. They have failed non-operative management including home exercise, medications, and injections. It is felt that they would benefit from undergoing total joint replacement. Risks and benefits of the procedure have been discussed with the patient and they elect to proceed with surgery. There are no active contraindications to surgery such as ongoing infection or rapidly progressive neurological disease.   Problem List/Past Medical Acute bilateral low back pain with bilateral sciatica (M54.42, M54.41)  Chronic pain of left knee (M25.562)  Primary osteoarthritis of right knee (M17.11)  Hiatal Hernia  Diverticulitis Of Colon  High blood pressure  Gastroesophageal Reflux Disease  Hypercholesterolemia  Hypothyroidism  Bilateral low back pain with bilateral sciatica (M54.42, M54.41)  Varicose veins  Irritable bowel syndrome  Urinary Incontinence  Anemia   Allergies Dilaudid-HP *ANALGESICS - OPIOID*  confusion, hallucinations Levaquin *Fluoroquinolones NSAIDs  GI Upset  Family History Cerebrovascular Accident  Maternal Grandmother. Hypertension  Maternal Grandmother.  Social History Tobacco use  Former smoker. 09/08/2015: smoke(d) less than 1/2 pack(s) per day Not under pain contract  Children  3 Current work status  retired Exercise  Exercises rarely; does running / walking Living situation  live with spouse Marital status  married Never consumed alcohol  09/08/2015: Never consumed alcohol No history of drug/alcohol rehab   Medication History Acetaminophen (  Tablet, Oral)  Active. Atorvastatin Calcium (   Tablet, Oral) Active. Astelin (137MCG/SPRAY Solution, Nasal) Active. Citalopram Hydrobromide (  Tablet, Oral) Active. Diclofenac Sodium (1% Gel, External) Active. Gabapentin (  Capsule, Oral) Active. Hydrochlorothiazide (  Tablet, Oral) Active. Levothyroxine Sodium ( Tablet, Oral) Active. Linzess ( Capsule, Oral) Active. Loratadine (  Tablet, Oral) Active. Losartan Potassium (  Tablet, Oral) Active. Mulitivitamin Active. Oxybutynin Chloride ( /5ML Syrup, Oral) Active. Pantoprazole Sodium (  Tablet DR, Oral) Active. Systane (0.4-0.3% Solution, Ophthalmic) Active. TraMADol HCl (  Tablet, Oral) Active. Vitamin E Active.    Past Surgical History Carpal Tunnel Repair  right Inguinal Hernia Repair  open: right Thyroidectomy; Subtotal  right Tubal Ligation     Review of Systems  General Present- Weight Loss. Not Present- Chills, Fatigue, Fever, Memory Loss, Night Sweats and Weight Gain. Skin Present- Hair Loss. Not Present- Eczema, Hives, Itching, Lesions and Rash. HEENT Not Present- Dentures, Double Vision, Headache, Hearing Loss, Tinnitus and Visual Loss. Respiratory Not Present- Allergies, Chronic Cough, Coughing up blood, Shortness of breath at rest and Shortness of breath with exertion. Cardiovascular Not Present- Chest Pain, Difficulty Breathing Lying Down, Murmur, Palpitations, Racing/skipping heartbeats and Swelling. Gastrointestinal Not Present- Abdominal Pain, Bloody Stool, Constipation, Diarrhea, Difficulty Swallowing, Heartburn, Jaundice, Loss of appetitie, Nausea and Vomiting. Female Genitourinary Present- Incontinence. Not Present- Blood in Urine, Discharge, Flank Pain, Painful Urination, Urgency, Urinary frequency, Urinary Retention, Urinating at Night and Weak urinary stream. Musculoskeletal Present- Joint Pain, Joint Stiffness and Morning Stiffness. Not Present- Back Pain, Joint Swelling, Muscle Pain, Muscle Weakness  and Spasms. Neurological Not Present- Blackout spells, Difficulty with balance, Dizziness, Paralysis, Tremor and Weakness. Psychiatric Not Present- Insomnia.  Vitals Weight: 178 lb Height: 65in Weight was reported by patient. Height was reported by patient. Body Surface Area: 1.88 m Body Mass Index: 29.62 kg/m  Pulse: 58 (Regular)  BP: 128/70 (Sitting, Right Arm, Standard)    Physical Exam General Mental Status -Alert, cooperative and good historian. General Appearance-pleasant, Not in acute distress. Orientation-Oriented X3. Build & Nutrition-Well nourished and Well developed.  Head and Neck Head-normocephalic, atraumatic . Neck Global Assessment - supple, no bruit auscultated on the right, no bruit auscultated on the left.  Eye Pupil - Bilateral-Regular and Round. Motion - Bilateral-EOMI.  Chest and Lung Exam Auscultation Breath sounds - clear at anterior chest wall and clear at posterior chest wall. Adventitious sounds - No Adventitious sounds.  Cardiovascular Auscultation Rhythm - Regular rate and rhythm. Heart Sounds - S1 WNL and S2 WNL. Murmurs & Other Heart Sounds - Auscultation of the heart reveals - No Murmurs.  Abdomen Palpation/Percussion Tenderness - Abdomen is non-tender to palpation. Rigidity (guarding) - Abdomen is soft. Auscultation Auscultation of the abdomen reveals - Bowel sounds normal.  Female Genitourinary Note: Not done, not pertinent to present illness   Musculoskeletal Note: Evaluation of the left hip shows flexion to 120 rotation in 30 out 40 and abduction 40 without discomfort. There is no tenderness over the greater trochanter. There is no pain on provocative testing of the hip.Examination of the right hip shows flexion to 120 rotation in 30 abduction 40 and external rotation of 40. There is no tenderness over the greater trochanter. There is no pain on provocative testing of the hip. Evaluation of the RIGHT knee  shows no effusion. She has a slight varus deformity. Range is 0-130 with minimal crepitus on range of motion. She is very tender medially with no lateral tenderness or instability. LEFT knee shows no effusion with range 0-130. She is tender medial greater than lateral with  no instability noted. Pulses sensation and motor are intact both lower extremities. She has a significantly antalgic gait pattern.  Radiographs AP and lateral both knees from last month are reviewed and she does have near bone-on-bone change in the medial compartment of both knees. This has progressed compared to x-rays from last year.   Assessment & Plan  Primary osteoarthritis of left knee (M17.12) Primary osteoarthritis of right knee (M17.11)  Note:Surgical Plans: Left Unicompartmental Knee Replacement  Disposition: Home, Outpatient Therapy in [place] to start on Friday Oct. 12th.  PCP: Dr. Lodema Hong - Patient has been seen preoperatively and felt to be stable for surgery.  IV TXA  Anesthesia Issues: None  Patient was instructed on what medications to stop prior to surgery.  Signed electronically by Lauraine Rinne, III PA-C

## 2017-08-09 NOTE — Anesthesia Procedure Notes (Signed)
Anesthesia Regional Block: Adductor canal block   Pre-Anesthetic Checklist: ,, timeout performed, Correct Patient, Correct Site, Correct Laterality, Correct Procedure, Correct Position, site marked, Risks and benefits discussed,  Surgical consent,  Pre-op evaluation,  At surgeon's request and post-op pain management  Laterality: Left and Lower  Prep: Maximum Sterile Barrier Precautions used, chloraprep       Needles:  Injection technique: Single-shot  Needle Type: Echogenic Stimulator Needle     Needle Length: 10cm      Additional Needles:   Procedures:,,,, ultrasound used (permanent image in chart),,,,  Narrative:  Start time: 08/09/2017 4:21 PM End time: 08/09/2017 4:31 PM Injection made incrementally with aspirations every 5 mL.  Performed by: Personally  Anesthesiologist: Phillips Grout  Additional Notes: Risks, benefits and alternative to block explained extensively.  Patient tolerated procedure well, without complications.

## 2017-08-09 NOTE — Discharge Instructions (Addendum)
Dr. Gaynelle Arabian Total Joint Specialist [facility] [address] [place], [place] [address] [phone]  UNI KNEE REPLACEMENT POSTOPERATIVE DIRECTIONS   Knee Rehabilitation, Guidelines Following Surgery  Results after knee surgery are often greatly improved when you follow the exercise, range of motion and muscle strengthening exercises prescribed by your doctor. Safety measures are also important to protect the knee from further injury. Any time any of these exercises cause you to have increased pain or swelling in your knee joint, decrease the amount until you are comfortable again and slowly increase them. If you have problems or questions, call your caregiver or physical therapist for advice.   HOME CARE INSTRUCTIONS  Remove items at home which could result in a fall. This includes throw rugs or furniture in walking pathways.   ICE to the affected knee every three hours for 30 minutes at a time and then as needed for pain and swelling.  Continue to use ice on the knee for pain and swelling from surgery. You may notice swelling that will progress down to the foot and ankle.  This is normal after surgery.  Elevate the leg when you are not up walking on it.    Continue to use the breathing machine which will help keep your temperature down.  It is common for your temperature to cycle up and down following surgery, especially at night when you are not up moving around and exerting yourself.  The breathing machine keeps your lungs expanded and your temperature down.  Do not place pillow under knee, focus on keeping the knee straight while resting  DIET You may resume your previous home diet once your are discharged from the hospital.  DRESSING / WOUND CARE / SHOWERING You may shower 3 days after surgery, but keep the wounds dry during showering.  You may use an occlusive plastic wrap (Press'n Seal for example), NO SOAKING/SUBMERGING IN THE BATHTUB.  If the  bandage gets wet, change with a clean dry gauze.  If the incision gets wet, pat the wound dry with a clean towel. Leave the surgical dressing on the knee for 48 hours.  May remove the dressing on the second day.  Remove the Ace Wrap along with the cotton padding.  Leave the steri-strip bandaids in place along the incision on the skin.  Cover the incision each day with some dry gauze and paper tape. You may start showering once you are discharged home but do not submerge the incision under water. Just pat the incision dry and apply a dry gauze dressing on daily. Change the surgical dressing daily and reapply a dry dressing each time.  ACTIVITY Walk with your walker as instructed. Use walker as long as suggested by your caregivers. Avoid periods of inactivity such as sitting longer than an hour when not asleep. This helps prevent blood clots.  You may resume a sexual relationship in one month or when given the OK by your doctor.  You may return to work once you are cleared by your doctor.  Do not drive a car for 6 weeks or until released by you surgeon.  Do not drive while taking narcotics.  WEIGHT BEARING Weight bearing as tolerated with assist device (walker, cane, etc) as directed, use it as long as suggested by your surgeon or therapist, typically at least 4-6 weeks.  POSTOPERATIVE CONSTIPATION PROTOCOL Constipation - defined medically as fewer than three stools per week and severe constipation as less than one stool per week.  One of the most common issues patients have following surgery is constipation.  Even if you have a regular bowel pattern at home, your normal regimen is likely to be disrupted due to multiple reasons following surgery.  Combination of anesthesia, postoperative narcotics, change in appetite and fluid intake all can affect your bowels.  In order to avoid complications following surgery, here are some recommendations in order to help you during your recovery  period.  Colace (docusate) - Pick up an over-the-counter form of Colace or another stool softener and take twice a day as long as you are requiring postoperative pain medications.  Take with a full glass of water daily.  If you experience loose stools or diarrhea, hold the colace until you stool forms back up.  If your symptoms do not get better within 1 week or if they get worse, check with your doctor.  Dulcolax (bisacodyl) - Pick up over-the-counter and take as directed by the product packaging as needed to assist with the movement of your bowels.  Take with a full glass of water.  Use this product as needed if not relieved by Colace only.   MiraLax (polyethylene glycol) - Pick up over-the-counter to have on hand.  MiraLax is a solution that will increase the amount of water in your bowels to assist with bowel movements.  Take as directed and can mix with a glass of water, juice, soda, coffee, or tea.  Take if you go more than two days without a movement. Do not use MiraLax more than once per day. Call your doctor if you are still constipated or irregular after using this medication for 7 days in a row.  If you continue to have problems with postoperative constipation, please contact the office for further assistance and recommendations.  If you experience "the worst abdominal pain ever" or develop nausea or vomiting, please contact the office immediatly for further recommendations for treatment.  ITCHING  If you experience itching with your medications, try taking only a single pain pill, or even half a pain pill at a time.  You can also use Benadryl over the counter for itching or also to help with sleep.   TED HOSE STOCKINGS Wear the elastic stockings on both legs for three weeks following surgery during the day but you may remove then at night for sleeping.  MEDICATIONS See your medication summary on the After Visit Summary that the nursing staff will review with you prior to discharge.   You may have some home medications which will be placed on hold until you complete the course of blood thinner medication.  It is important for you to complete the blood thinner medication as prescribed by your surgeon.  Continue your approved medications as instructed at time of discharge.  PRECAUTIONS If you experience chest pain or shortness of breath - call 911 immediately for transfer to the hospital emergency department.  If you develop a fever greater that 101 F, purulent drainage from wound, increased redness or drainage from wound, foul odor from the wound/dressing, or calf pain - CONTACT YOUR SURGEON.                                                   FOLLOW-UP APPOINTMENTS Make sure you keep all of your appointments after your operation with your surgeon and caregivers. You should call  the office at the above phone number and make an appointment for approximately two weeks after the date of your surgery or on the date instructed by your surgeon outlined in the "After Visit Summary".  RANGE OF MOTION AND STRENGTHENING EXERCISES  Rehabilitation of the knee is important following a knee injury or an operation. After just a few days of immobilization, the muscles of the thigh which control the knee become weakened and shrink (atrophy). Knee exercises are designed to build up the tone and strength of the thigh muscles and to improve knee motion. Often times heat used for twenty to thirty minutes before working out will loosen up your tissues and help with improving the range of motion but do not use heat for the first two weeks following surgery. These exercises can be done on a training (exercise) mat, on the floor, on a table or on a bed. Use what ever works the best and is most comfortable for you Knee exercises include:  Leg Lifts - While your knee is still immobilized in a splint or cast, you can do straight leg raises. Lift the leg to 60 degrees, hold for 3 sec, and slowly lower the leg. Repeat  10-20 times 2-3 times daily. Perform this exercise against resistance later as your knee gets better.  Quad and Hamstring Sets - Tighten up the muscle on the front of the thigh (Quad) and hold for 5-10 sec. Repeat this 10-20 times hourly. Hamstring sets are done by pushing the foot backward against an object and holding for 5-10 sec. Repeat as with quad sets.   Leg Slides: Lying on your back, slowly slide your foot toward your buttocks, bending your knee up off the floor (only go as far as is comfortable). Then slowly slide your foot back down until your leg is flat on the floor again.  Angel Wings: Lying on your back spread your legs to the side as far apart as you can without causing discomfort.  A rehabilitation program following serious knee injuries can speed recovery and prevent re-injury in the future due to weakened muscles. Contact your doctor or a physical therapist for more information on knee rehabilitation.   IF YOU ARE TRANSFERRED TO A SKILLED REHAB FACILITY If the patient is transferred to a skilled rehab facility following release from the hospital, a list of the current medications will be sent to the facility for the patient to continue.  When discharged from the skilled rehab facility, please have the facility set up the patient's [place] prior to being released. Also, the skilled facility will be responsible for providing the patient with their medications at time of release from the facility to include their pain medication, the muscle relaxants, and their blood thinner medication. If the patient is still at the rehab facility at time of the two week follow up appointment, the skilled rehab facility will also need to assist the patient in arranging follow up appointment in our office and any transportation needs.  MAKE SURE YOU:  Understand these instructions.  Get help right away if you are not doing well or get worse.    Pick up stool softner and laxative  for home use following surgery while on pain medications. Do not submerge incision under water. Please use good hand washing techniques while changing dressing each day. May shower starting three days after surgery. Please use a clean towel to pat the incision dry following showers. Continue to use ice for pain and swelling  after surgery. Do not use any lotions or creams on the incision until instructed by your surgeon.   Take Xarelto 10 mg daily for ten days, then change to Aspirin 325 mg daily for two weeks, then reduce to Baby Aspirin 81 mg daily for three additional weeks.        Information on my medicine - XARELTO (Rivaroxaban)  This medication education was reviewed with me or my healthcare representative as part of my discharge preparation.    Why was Xarelto prescribed for you? Xarelto was prescribed for you to reduce the risk of blood clots forming after orthopedic surgery. The medical term for these abnormal blood clots is venous thromboembolism (VTE).  What do you need to know about xarelto ? Take your Xarelto ONCE DAILY at the same time every day. You may take it either with or without food.  If you have difficulty swallowing the tablet whole, you may crush it and mix in applesauce just prior to taking your dose.  Take Xarelto exactly as prescribed by your doctor and DO NOT stop taking Xarelto without talking to the doctor who prescribed the medication.  Stopping without other VTE prevention medication to take the place of Xarelto may increase your risk of developing a clot.  After discharge, you should have regular check-up appointments with your healthcare provider that is prescribing your Xarelto.    What do you do if you miss a dose? If you miss a dose, take it as soon as you remember on the same day then continue your regularly scheduled once daily regimen the next day. Do not take two doses of Xarelto on the same day.   Important Safety Information A  possible side effect of Xarelto is bleeding. You should call your healthcare provider right away if you experience any of the following: ? Bleeding from an injury or your nose that does not stop. ? Unusual colored urine (red or dark brown) or unusual colored stools (red or black). ? Unusual bruising for unknown reasons. ? A serious fall or if you hit your head (even if there is no bleeding).  Some medicines may interact with Xarelto and might increase your risk of bleeding while on Xarelto. To help avoid this, consult your healthcare provider or pharmacist prior to using any new prescription or non-prescription medications, including herbals, vitamins, non-steroidal anti-inflammatory drugs (NSAIDs) and supplements.  This website has more information on Xarelto: VisitDestination.com.br.

## 2017-08-09 NOTE — Anesthesia Procedure Notes (Signed)
Spinal  Patient location during procedure: OR Start time: 08/09/2017 5:58 PM End time: 08/09/2017 6:07 PM Reason for block: at surgeon's request Staffing Resident/CRNA: Christell Faith L Performed: resident/CRNA  Preanesthetic Checklist Completed: patient identified, site marked, surgical consent, pre-op evaluation, timeout performed, IV checked, risks and benefits discussed and monitors and equipment checked Spinal Block Patient position: sitting Prep: DuraPrep Patient monitoring: heart rate, continuous pulse ox and blood pressure Approach: right paramedian Location: L3-4 Injection technique: single-shot Needle Needle type: Pencan  Needle gauge: 24 G Needle length: 9 cm Assessment Sensory level: T4 Additional Notes Expiration of kit checked and confirmed. Patient tolerated procedure well,without complications x 1 attempt with noted clear CSF. Loss of motor and sensory on exam post injection.

## 2017-08-09 NOTE — Progress Notes (Signed)
AssistedDr. Carignan with left, ultrasound guided, adductor canal block. Side rails up, monitors on throughout procedure. See vital signs in flow sheet. Tolerated Procedure well.  

## 2017-08-09 NOTE — Anesthesia Procedure Notes (Signed)
Procedure Name: MAC Date/Time: 08/09/2017 5:58 PM Performed by: West Pugh Pre-anesthesia Checklist: Patient identified, Emergency Drugs available, Suction available, Patient being monitored and Timeout performed Patient Re-evaluated:Patient Re-evaluated prior to induction Oxygen Delivery Method: Nasal cannula Placement Confirmation: CO2 detector,  positive ETCO2 and breath sounds checked- equal and bilateral Dental Injury: Teeth and Oropharynx as per pre-operative assessment

## 2017-08-09 NOTE — Anesthesia Preprocedure Evaluation (Signed)
Anesthesia Evaluation  Patient identified by MRN, date of birth, ID band Patient awake    Reviewed: Allergy & Precautions, NPO status , Patient's Chart, lab work & pertinent test results  Airway Mallampati: II  TM Distance: >3 FB Neck ROM: Full    Dental no notable dental hx.    Pulmonary neg pulmonary ROS, former smoker,    Pulmonary exam normal breath sounds clear to auscultation       Cardiovascular hypertension, Pt. on medications Normal cardiovascular exam Rhythm:Regular Rate:Normal     Neuro/Psych negative neurological ROS  negative psych ROS   GI/Hepatic negative GI ROS, Neg liver ROS,   Endo/Other  Hypothyroidism   Renal/GU negative Renal ROS  negative genitourinary   Musculoskeletal negative musculoskeletal ROS (+)   Abdominal   Peds negative pediatric ROS (+)  Hematology negative hematology ROS (+)   Anesthesia Other Findings   Reproductive/Obstetrics negative OB ROS                            Anesthesia Physical Anesthesia Plan  ASA: II  Anesthesia Plan: Spinal   Post-op Pain Management:  Regional for Post-op pain   Induction:   PONV Risk Score and Plan: 2 and Ondansetron, Dexamethasone and Treatment may vary due to age or medical condition  Airway Management Planned: Simple Face Mask  Additional Equipment:   Intra-op Plan:   Post-operative Plan:   Informed Consent: I have reviewed the patients History and Physical, chart, labs and discussed the procedure including the risks, benefits and alternatives for the proposed anesthesia with the patient or authorized representative who has indicated his/her understanding and acceptance.   Dental advisory given  Plan Discussed with:   Anesthesia Plan Comments: (Adductor block)        Anesthesia Quick Evaluation

## 2017-08-09 NOTE — Interval H&P Note (Signed)
History and Physical Interval Note:  08/09/2017 4:12 PM  Laura [name]  has presented today for surgery, with the diagnosis of Medial compartment osteoarthritis Left knee  The various methods of treatment have been discussed with the patient and family. After consideration of risks, benefits and other options for treatment, the patient has consented to  Procedure(s): LEFT UNICOMPARTMENTAL KNEE (Left) as a surgical intervention .  The patient's history has been reviewed, patient examined, no change in status, stable for surgery.  I have reviewed the patient's chart and labs.  Questions were answered to the patient's satisfaction.     Loanne Drilling

## 2017-08-09 NOTE — Transfer of Care (Signed)
Immediate Anesthesia Transfer of Care Note  Patient: Laura [name]  Procedure(s) Performed: LEFT UNICOMPARTMENTAL KNEE (Left )  Patient Location: PACU  Anesthesia Type:Spinal  Level of Consciousness: awake, alert  and oriented  Airway & Oxygen Therapy: Patient Spontanous Breathing and Patient connected to nasal cannula oxygen  Post-op Assessment: Report given to RN and Post -op Vital signs reviewed and stable  Post vital signs: Reviewed and stable  Last Vitals:  Vitals:   08/09/17 1637 08/09/17 1652  BP: 130/71 136/75  Pulse: 73 70  Resp: 16 17  Temp:    SpO2: 98% 100%    Last Pain: There were no vitals filed for this visit.       Complications: No apparent anesthesia complications

## 2017-08-10 ENCOUNTER — Encounter (HOSPITAL_COMMUNITY): Payer: Self-pay | Admitting: Orthopedic Surgery

## 2017-08-10 DIAGNOSIS — I10 Essential (primary) hypertension: Secondary | ICD-10-CM | POA: Diagnosis not present

## 2017-08-10 DIAGNOSIS — Z881 Allergy status to other antibiotic agents status: Secondary | ICD-10-CM | POA: Diagnosis not present

## 2017-08-10 DIAGNOSIS — R269 Unspecified abnormalities of gait and mobility: Secondary | ICD-10-CM | POA: Diagnosis not present

## 2017-08-10 DIAGNOSIS — H04123 Dry eye syndrome of bilateral lacrimal glands: Secondary | ICD-10-CM | POA: Diagnosis not present

## 2017-08-10 DIAGNOSIS — E785 Hyperlipidemia, unspecified: Secondary | ICD-10-CM | POA: Diagnosis not present

## 2017-08-10 DIAGNOSIS — Z79899 Other long term (current) drug therapy: Secondary | ICD-10-CM | POA: Diagnosis not present

## 2017-08-10 DIAGNOSIS — Z885 Allergy status to narcotic agent status: Secondary | ICD-10-CM | POA: Diagnosis not present

## 2017-08-10 DIAGNOSIS — Z87891 Personal history of nicotine dependence: Secondary | ICD-10-CM | POA: Diagnosis not present

## 2017-08-10 DIAGNOSIS — E039 Hypothyroidism, unspecified: Secondary | ICD-10-CM | POA: Diagnosis not present

## 2017-08-10 DIAGNOSIS — E7800 Pure hypercholesterolemia, unspecified: Secondary | ICD-10-CM | POA: Diagnosis not present

## 2017-08-10 DIAGNOSIS — M8580 Other specified disorders of bone density and structure, unspecified site: Secondary | ICD-10-CM | POA: Diagnosis not present

## 2017-08-10 DIAGNOSIS — Z888 Allergy status to other drugs, medicaments and biological substances status: Secondary | ICD-10-CM | POA: Diagnosis not present

## 2017-08-10 DIAGNOSIS — M170 Bilateral primary osteoarthritis of knee: Secondary | ICD-10-CM | POA: Diagnosis not present

## 2017-08-10 DIAGNOSIS — Z8719 Personal history of other diseases of the digestive system: Secondary | ICD-10-CM | POA: Diagnosis not present

## 2017-08-10 DIAGNOSIS — Z7901 Long term (current) use of anticoagulants: Secondary | ICD-10-CM | POA: Diagnosis not present

## 2017-08-10 DIAGNOSIS — K219 Gastro-esophageal reflux disease without esophagitis: Secondary | ICD-10-CM | POA: Diagnosis not present

## 2017-08-10 DIAGNOSIS — M25762 Osteophyte, left knee: Secondary | ICD-10-CM | POA: Diagnosis not present

## 2017-08-10 DIAGNOSIS — K589 Irritable bowel syndrome without diarrhea: Secondary | ICD-10-CM | POA: Diagnosis not present

## 2017-08-10 LAB — CBC
HEMATOCRIT: 32.7 % — AB (ref 36.0–46.0)
HEMOGLOBIN: 10.5 g/dL — AB (ref 12.0–15.0)
MCH: 27.2 pg (ref 26.0–34.0)
MCHC: 32.1 g/dL (ref 30.0–36.0)
MCV: 84.7 fL (ref 78.0–100.0)
Platelets: 270 10*3/uL (ref 150–400)
RBC: 3.86 MIL/uL — ABNORMAL LOW (ref 3.87–5.11)
RDW: 12.7 % (ref 11.5–15.5)
WBC: 8.8 10*3/uL (ref 4.0–10.5)

## 2017-08-10 LAB — BASIC METABOLIC PANEL
ANION GAP: 9 (ref 5–15)
BUN: 10 mg/dL (ref 6–20)
CHLORIDE: 104 mmol/L (ref 101–111)
CO2: 26 mmol/L (ref 22–32)
Calcium: 9.4 mg/dL (ref 8.9–10.3)
Creatinine, Ser: 0.91 mg/dL (ref 0.44–1.00)
GFR calc non Af Amer: >60 mL/min (ref >60)
GLUCOSE: 170 mg/dL — AB (ref 65–99)
POTASSIUM: 3.5 mmol/L (ref 3.5–5.1)
Sodium: 139 mmol/L (ref 135–145)

## 2017-08-10 MED ORDER — TRAMADOL HCL 50 MG PO TABS: 100.0000 mg | ORAL_TABLET | Freq: Every day | ORAL | 0 refills | Status: DC | PRN

## 2017-08-10 MED ORDER — OXYCODONE HCL 5 MG PO TABS: 5.0000 mg | ORAL_TABLET | ORAL | 0 refills | Status: DC | PRN

## 2017-08-10 MED ORDER — METHOCARBAMOL 500 MG PO TABS: 500.0000 mg | ORAL_TABLET | Freq: Four times a day (QID) | ORAL | 0 refills | Status: DC | PRN

## 2017-08-10 MED ORDER — RIVAROXABAN 10 MG PO TABS: 10.0000 mg | ORAL_TABLET | Freq: Every day | ORAL | 0 refills | Status: DC

## 2017-08-10 NOTE — Progress Notes (Signed)
   Subjective: 1 Day Post-Op Procedure(s) (LRB): LEFT UNICOMPARTMENTAL KNEE (Left) Patient reports pain as mild.   Patient seen in rounds by Dr. Lequita Halt. Patient is well, and has had no acute complaints or problems Patient is ready to go home following therapy  Objective: Vital signs in last 24 hours: Temp:  [97.5 F (36.4 C)-98.4 F (36.9 C)] 98.2 F (36.8 C) (10/11 0632) Pulse Rate:  [58-74] 73 (10/11 0632) Resp:  [12-23] 18 (10/11 0632) BP: (110-156)/(62-90) 110/67 (10/11 0632) SpO2:  [94 %-100 %] 98 % (10/11 0632) Weight:  [79.8 kg (176 lb)] 79.8 kg (176 lb) (10/10 1509)  Intake/Output from previous day:  Intake/Output Summary (Last 24 hours) at 08/10/17 0836 Last data filed at 08/10/17 0723  Gross per 24 hour  Intake             3185 ml  Output             1300 ml  Net             1885 ml    Intake/Output this shift: Total I/O In: 120 [P.O.:120] Out: 400 [Urine:400]  Labs:  Recent Labs  08/10/17 0509  HGB 10.5*    Recent Labs  08/10/17 0509  WBC 8.8  RBC 3.86*  HCT 32.7*  PLT 270    Recent Labs  08/10/17 0509  NA 139  K 3.5  CL 104  CO2 26  BUN 10  CREATININE 0.91  GLUCOSE 170*  CALCIUM 9.4   No results for input(s): LABPT, INR in the last 72 hours.  EXAM: General - Patient is Alert and Appropriate Extremity - Neurovascular intact Sensation intact distally Intact pulses distally Dorsiflexion/Plantar flexion intact Dressing - clean, dry, no drainage Motor Function - intact, moving foot and toes well on exam.  Hemovac pulled without difficulty.  Assessment/Plan: 1 Day Post-Op Procedure(s) (LRB): LEFT UNICOMPARTMENTAL KNEE (Left) Procedure(s) (LRB): LEFT UNICOMPARTMENTAL KNEE (Left) Past Medical History:  Diagnosis Date  . Anxiety   . Cystitis   . Depression   . Gastritis   . GERD (gastroesophageal reflux disease)   . Hashimoto's thyroiditis    Hx   . Hyperlipidemia   . Hypertension   . Hypothyroidism   . Normocytic anemia  2009 Hb 9.9-11.1  . Osteopenia   . Shoulder pain   . Urinary incontinence    Principal Problem:   OA (osteoarthritis) of knee  Estimated body mass index is 30.21 kg/m as calculated from the following:   Height as of this encounter:  (1.626 m).   Weight as of this encounter: 79.8 kg (176 lb). Advance diet Up with therapy Discharge home - Outpatient Therapy in [place] to start on Friday Oct. 12th. Diet - Cardiac diet Follow up - in 2 weeks Activity - WBAT Dressing - May remove the surgical dressing tomorrow at home and then apply a dry gauze dressing daily. May shower three days following surgery but do not submerge the incision under water. Disposition - Home Condition Upon Discharge - Stable D/C Meds - See DC Summary DVT Prophylaxis Xarelto 10 mg daily for ten days, then change to Aspirin 325 mg daily for two weeks, then reduce to Baby Aspirin 81 mg daily for three additional weeks.  Avel Peace, PA-C Orthopaedic Surgery 08/10/2017, 8:36 AM

## 2017-08-10 NOTE — Evaluation (Signed)
Occupational Therapy Evaluation Patient Details Name: Laura [name] MRN: [id number] DOB: February 18, 1951 Today's Date: 08/10/2017    History of Present Illness s/p L uni knee   Clinical Impression   This 66 year old female was admitted for the above sx. All education was completed.  No further OT is needed at this time   Follow Up Recommendations  No OT follow up;Supervision/Assistance - 24 hour    Equipment Recommendations  3 in 1 bedside commode    Recommendations for Other Services       Precautions / Restrictions Precautions Precautions: Fall;Knee Restrictions Weight Bearing Restrictions: No      Mobility Bed Mobility Overal bed mobility: Needs Assistance Bed Mobility: Supine to Sit     Supine to sit: Min assist     General bed mobility comments: assist for LLE  Transfers Overall transfer level: Needs assistance Equipment used: Rolling walker (2 wheeled) Transfers: Sit to/from Stand Sit to Stand: Min assist;Min guard         General transfer comment: steadying assistance for first transfer and cues for UE/LE placement    Balance                                           ADL either performed or assessed with clinical judgement   ADL Overall ADL's : Needs assistance/impaired Eating/Feeding: Independent   Grooming: Oral care;Supervision/safety;Standing   Upper Body Bathing: Set up;Sitting   Lower Body Bathing: Minimal assistance;Sit to/from stand   Upper Body Dressing : Set up;Sitting   Lower Body Dressing: Moderate assistance;Sit to/from stand   Toilet Transfer: Min guard;Ambulation;BSC;RW   Toileting- Architect and Hygiene: Min guard;Sit to/from stand         General ADL Comments: performed ADL and toileting in the bathroom.  Pt plans to sponge bathe initially as she has a tub; discussed tub readiness.  Reviewed precautions     Vision         Perception     Praxis      Pertinent Vitals/Pain Pain  Assessment: 0-10 Pain Score: 2  Pain Location: L knee Pain Descriptors / Indicators: Sore Pain Intervention(s): Limited activity within patient's tolerance;Monitored during session;Premedicated before session;Repositioned;Ice applied     Hand Dominance     Extremity/Trunk Assessment Upper Extremity Assessment Upper Extremity Assessment: Overall WFL for tasks assessed           Communication Communication Communication: No difficulties   Cognition Arousal/Alertness: Awake/alert Behavior During Therapy: WFL for tasks assessed/performed Overall Cognitive Status: Within Functional Limits for tasks assessed                                     General Comments       Exercises     Shoulder Instructions      Home Living Family/patient expects to be discharged to:: Private residence Living Arrangements: Spouse/significant other Available Help at Discharge: Family Type of Home: House Home Access: Stairs to enter Secretary/administrator of Steps: 1         Bathroom Shower/Tub: Chief Strategy Officer: Standard     Home Equipment: None          Prior Functioning/Environment Level of Independence: Independent  OT Problem List:        OT Treatment/Interventions:      OT Goals(Current goals can be found in the care plan section) Acute Rehab OT Goals Patient Stated Goal: return to independence OT Goal Formulation: All assessment and education complete, DC therapy  OT Frequency:     Barriers to D/C:            Co-evaluation              AM-PAC PT "6 Clicks" Daily Activity     Outcome Measure Help from another person eating meals?: None Help from another person taking care of personal grooming?: A Little Help from another person toileting, which includes using toliet, bedpan, or urinal?: A Little Help from another person bathing (including washing, rinsing, drying)?: A Little Help from another person to  put on and taking off regular upper body clothing?: A Little Help from another person to put on and taking off regular lower body clothing?: A Lot 6 Click Score: 18   End of Session    Activity Tolerance: Patient tolerated treatment well Patient left: in chair;with call bell/phone within reach  OT Visit Diagnosis: Pain Pain - Right/Left: Left Pain - part of body: Knee                Time: 1610-9604 OT Time Calculation (min): 26 min Charges:  OT General Charges $OT Visit: 1 Visit OT Evaluation $OT Eval Low Complexity: 1 Low OT Treatments $Self Care/Home Management : 8-22 mins G-Codes: OT G-codes **NOT FOR INPATIENT CLASS** Functional Assessment Tool Used: AM-PAC 6 Clicks Daily Activity Functional Limitation: Self care Self Care Current Status (V4098): At least 40 percent but less than 60 percent impaired, limited or restricted Self Care Goal Status (J1914): At least 40 percent but less than 60 percent impaired, limited or restricted Self Care Discharge Status [phone]): At least 40 percent but less than 60 percent impaired, limited or restricted   Laura [name], OTR/L [phone] 08/10/2017  Laura [name] 08/10/2017, 10:16 AM

## 2017-08-10 NOTE — Care Management Note (Signed)
Case Management Note  Patient Details  Name: WYNEMA GAROUTTE MRN: [id number] Date of Birth: June 28, 1951  Subjective/Objective:  66 y/o f admitted w/L knee OA. From home. No PT f/u. Patient states she is already set up w/otpt PT in office. Ordered for rw,& 3n1-[facility] to deliver to rm prior d/c. No further CM needs.                 Action/Plan:d/c home w/DME   Expected Discharge Date:  08/10/17               Expected Discharge Plan:  OP Rehab  In-House Referral:     Discharge planning Services  CM Consult  Post Acute Care Choice:    Choice offered to:     DME Arranged:  3-N-1, Walker rolling DME Agency:  [facility]  HH Arranged:    [facility] Agency:     Status of Service:  Completed, signed off  If discussed at [employer] of Stay Meetings, dates discussed:    Additional Comments:  Lanier Clam, RN 08/10/2017, 1:03 PM

## 2017-08-10 NOTE — Discharge Summary (Signed)
Physician Discharge Summary   Patient ID: Laura [name] MRN: [id number] DOB/AGE: 67-Jan-1952 67 y.o.  Admit date: 08/09/2017 Discharge date: 08-10-2017  Primary Diagnosis:  Medial compartment osteoarthritis, Left knee Admission Diagnoses:  Past Medical History:  Diagnosis Date  . Anxiety   . Cystitis   . Depression   . Gastritis   . GERD (gastroesophageal reflux disease)   . Hashimoto's thyroiditis    Hx   . Hyperlipidemia   . Hypertension   . Hypothyroidism   . Normocytic anemia 2009 Hb 9.9-11.1  . Osteopenia   . Shoulder pain   . Urinary incontinence    Discharge Diagnoses:   Principal Problem:   OA (osteoarthritis) of knee  Estimated body mass index is 30.21 kg/m as calculated from the following:   Height as of this encounter: 5' 4"  (1.626 m).   Weight as of this encounter: 79.8 kg (176 lb).  Procedure:  Procedure(s) (LRB): LEFT UNICOMPARTMENTAL KNEE (Left)   Consults: None  HPI: Laura [name] is a 66 y.o. female, who has  significant isolated medial compartment arthritis of the Left knee. The patient has had nonoperative management including injections of cortisone and viscous supplements. Unfortunately, the pain persists.  Radiograph showed isolated medial compartment bone-on-bone arthritis  with normal-appearing patellofemoral and lateral compartments. The patient presents now for left knee unicompartmental arthroplasty.   Laboratory Data: Admission on 08/09/2017  Component Date Value Ref Range Status  . WBC 08/10/2017 8.8  4.0 - 10.5 K/uL Final  . RBC 08/10/2017 3.86* 3.87 - 5.11 MIL/uL Final  . Hemoglobin 08/10/2017 10.5* 12.0 - 15.0 g/dL Final  . HCT 08/10/2017 32.7* 36.0 - 46.0 % Final  . MCV 08/10/2017 84.7  78.0 - 100.0 fL Final  . MCH 08/10/2017 27.2  26.0 - 34.0 pg Final  . MCHC 08/10/2017 32.1  30.0 - 36.0 g/dL Final  . RDW 08/10/2017 12.7  11.5 - 15.5 % Final  . Platelets 08/10/2017 270  150 - 400 K/uL Final  . Sodium 08/10/2017 139  135 -  145 mmol/L Final  . Potassium 08/10/2017 3.5  3.5 - 5.1 mmol/L Final  . Chloride 08/10/2017 104  101 - 111 mmol/L Final  . CO2 08/10/2017 26  22 - 32 mmol/L Final  . Glucose, Bld 08/10/2017 170* 65 - 99 mg/dL Final  . BUN 08/10/2017 10  6 - 20 mg/dL Final  . Creatinine, Ser 08/10/2017 0.91  0.44 - 1.00 mg/dL Final  . Calcium 08/10/2017 9.4  8.9 - 10.3 mg/dL Final  . GFR calc non Af Amer 08/10/2017 >60  >60 mL/min Final  . GFR calc Af Amer 08/10/2017 >60  >60 mL/min Final   Comment: (NOTE) The eGFR has been calculated using the CKD EPI equation. This calculation has not been validated in all clinical situations. eGFR's persistently <60 mL/min signify possible Chronic Kidney Disease.   Georgiann Hahn gap 08/10/2017 9  5 - 15 Final  Hospital Outpatient Visit on 08/03/2017  Component Date Value Ref Range Status  . aPTT 08/03/2017 34  24 - 36 seconds Final  . WBC 08/03/2017 3.6* 4.0 - 10.5 K/uL Final  . RBC 08/03/2017 3.99  3.87 - 5.11 MIL/uL Final  . Hemoglobin 08/03/2017 10.7* 12.0 - 15.0 g/dL Final  . HCT 08/03/2017 33.1* 36.0 - 46.0 % Final  . MCV 08/03/2017 83.0  78.0 - 100.0 fL Final  . MCH 08/03/2017 26.8  26.0 - 34.0 pg Final  . MCHC 08/03/2017 32.3  30.0 - 36.0 g/dL Final  .  RDW 08/03/2017 12.6  11.5 - 15.5 % Final  . Platelets 08/03/2017 275  150 - 400 K/uL Final  . Sodium 08/03/2017 139  135 - 145 mmol/L Final  . Potassium 08/03/2017 3.2* 3.5 - 5.1 mmol/L Final  . Chloride 08/03/2017 102  101 - 111 mmol/L Final  . CO2 08/03/2017 31  22 - 32 mmol/L Final  . Glucose, Bld 08/03/2017 100* 65 - 99 mg/dL Final  . BUN 08/03/2017 9  6 - 20 mg/dL Final  . Creatinine, Ser 08/03/2017 0.86  0.44 - 1.00 mg/dL Final  . Calcium 08/03/2017 9.7  8.9 - 10.3 mg/dL Final  . Total Protein 08/03/2017 7.2  6.5 - 8.1 g/dL Final  . Albumin 08/03/2017 3.9  3.5 - 5.0 g/dL Final  . AST 08/03/2017 22  15 - 41 U/L Final  . ALT 08/03/2017 17  14 - 54 U/L Final  . Alkaline Phosphatase 08/03/2017 71  38 - 126  U/L Final  . Total Bilirubin 08/03/2017 0.7  0.3 - 1.2 mg/dL Final  . GFR calc non Af Amer 08/03/2017 >60  >60 mL/min Final  . GFR calc Af Amer 08/03/2017 >60  >60 mL/min Final   Comment: (NOTE) The eGFR has been calculated using the CKD EPI equation. This calculation has not been validated in all clinical situations. eGFR's persistently <60 mL/min signify possible Chronic Kidney Disease.   . Anion gap 08/03/2017 6  5 - 15 Final  . Prothrombin Time 08/03/2017 12.1  11.4 - 15.2 seconds Final  . INR 08/03/2017 0.90   Final  . ABO/RH(D) 08/03/2017 O POS   Final  . Antibody Screen 08/03/2017 NEG   Final  . Sample Expiration 08/03/2017 08/12/2017   Final  . Extend sample reason 08/03/2017 NO TRANSFUSIONS OR PREGNANCY IN THE PAST 3 MONTHS   Final  . MRSA, PCR 08/03/2017 NEGATIVE  NEGATIVE Final  . Staphylococcus aureus 08/03/2017 NEGATIVE  NEGATIVE Final   Comment: (NOTE) The Xpert SA Assay (FDA approved for NASAL specimens in patients 79 years of age and older), is one component of a comprehensive surveillance program. It is not intended to diagnose infection nor to guide or monitor treatment.   . ABO/RH(D) 08/03/2017 O POS   Final  Appointment on 07/07/2017  Component Date Value Ref Range Status  . Sodium 07/07/2017 138  135 - 145 mmol/L Final  . Potassium 07/07/2017 3.1* 3.5 - 5.1 mmol/L Final  . Chloride 07/07/2017 101  101 - 111 mmol/L Final  . CO2 07/07/2017 30  22 - 32 mmol/L Final  . Glucose, Bld 07/07/2017 100* 65 - 99 mg/dL Final  . BUN 07/07/2017 13  6 - 20 mg/dL Final  . Creatinine, Ser 07/07/2017 0.87  0.44 - 1.00 mg/dL Final  . Calcium 07/07/2017 9.6  8.9 - 10.3 mg/dL Final  . Total Protein 07/07/2017 7.4  6.5 - 8.1 g/dL Final  . Albumin 07/07/2017 4.0  3.5 - 5.0 g/dL Final  . AST 07/07/2017 19  15 - 41 U/L Final  . ALT 07/07/2017 18  14 - 54 U/L Final  . Alkaline Phosphatase 07/07/2017 64  38 - 126 U/L Final  . Total Bilirubin 07/07/2017 0.5  0.3 - 1.2 mg/dL Final    . GFR calc non Af Amer 07/07/2017 >60  >60 mL/min Final  . GFR calc Af Amer 07/07/2017 >60  >60 mL/min Final   Comment: (NOTE) The eGFR has been calculated using the CKD EPI equation. This calculation has not been validated in all  clinical situations. eGFR's persistently <60 mL/min signify possible Chronic Kidney Disease.   . Anion gap 07/07/2017 7  5 - 15 Final  . Erythropoietin 07/07/2017 10.6  2.6 - 18.5 mIU/mL Final   Comment: (NOTE) [employer] UniCel DxI 800 Immunoassay System Performed At: [facility] [place], [place] [id number] Lindon Romp MD WU:[id number]   . Iron 07/07/2017 60  28 - 170 ug/dL Final  . TIBC 07/07/2017 265  250 - 450 ug/dL Final  . Saturation Ratios 07/07/2017 23  10.4 - 31.8 % Final  . UIBC 07/07/2017 205  ug/dL Final   Performed at [facility], [place] [address] [place], [place] [address]  . Ferritin 07/07/2017 105  11 - 307 ng/mL Final   Performed at [facility], [place] [address], [place], [place] [address]  . Vitamin B-12 07/07/2017 1163* 180 - 914 pg/mL Final   Comment: (NOTE) This assay is not validated for testing neonatal or myeloproliferative syndrome specimens for Vitamin B12 levels. Performed at [facility], [place] [address], [place], [place] [address]   . Folate 07/07/2017 34.0  >5.9 ng/mL Final   Performed at [facility], [place] [address] [place], [place] [address]  . Hgb A2 Quant 07/07/2017 1.9  1.8 - 3.2 % Final  . Hgb F Quant 07/07/2017 0.0  0.0 - 2.0 % Final  . Hgb S Quant 07/07/2017 0.0  0.0 % Final  . Hgb C 07/07/2017 0.0  0.0 % Corrected  . Hgb A 07/07/2017 98.1  96.4 - 98.8 % Final  . Hgb Variant 07/07/2017 0.0  0.0 % Corrected  . Please Note: 07/07/2017 Comment   Corrected   Comment: (NOTE) Normal adult hemoglobin present. Performed At: [facility] [place], [place] [id number] Lindon Romp MD UU:[id number]   . Retic Ct Pct 07/07/2017 1.7  0.4 -  3.1 % Final  . RBC. 07/07/2017 4.00  3.87 - 5.11 MIL/uL Final  . Retic Count, Absolute 07/07/2017 68.0  19.0 - 186.0 K/uL Final  . IgG (Immunoglobin G), Serum 07/07/2017 1567  700 - 1,600 mg/dL Final  . IgA 07/07/2017 221  87 - 352 mg/dL Final  . IgM (Immunoglobulin M), Srm 07/07/2017 117  26 - 217 mg/dL Final  . Total Protein ELP 07/07/2017 7.3  6.0 - 8.5 g/dL Corrected  . Albumin SerPl Elph-Mcnc 07/07/2017 3.9  2.9 - 4.4 g/dL Corrected  . Alpha 1 07/07/2017 0.2  0.0 - 0.4 g/dL Corrected  . Alpha2 Glob SerPl Elph-Mcnc 07/07/2017 0.7  0.4 - 1.0 g/dL Corrected  . B-Globulin SerPl Elph-Mcnc 07/07/2017 1.0  0.7 - 1.3 g/dL Corrected  . Gamma Glob SerPl Elph-Mcnc 07/07/2017 1.5  0.4 - 1.8 g/dL Corrected  . M Protein SerPl Elph-Mcnc 07/07/2017 Not Observed  Not Observed g/dL Corrected  . Globulin, Total 07/07/2017 3.4  2.2 - 3.9 g/dL Corrected  . Albumin/Glob SerPl 07/07/2017 1.2  0.7 - 1.7 Corrected  . IFE 1 07/07/2017 Comment   Corrected   An apparent normal immunofixation pattern.  . Please Note 07/07/2017 Comment   Corrected   Comment: (NOTE) Protein electrophoresis scan will follow via computer, mail, or courier delivery. Performed At: [facility] [place], [place] [id number] Lindon Romp MD OV:[id number]   Office Visit on 07/07/2017  Component Date Value Ref Range Status  . ANA Ab, IFA 07/07/2017 Negative   Final   Comment: (NOTE)  Negative   <1:80                                     Borderline  1:80                                     Positive   >1:80 Performed At: [facility] [place], [place] [id number] Lindon Romp MD JS:[id number]   Office Visit on 06/21/2017  Component Date Value Ref Range Status  . Cholesterol, Total 06/21/2017 185  100 - 199 mg/dL Final  . Triglycerides 06/21/2017 183* 0 - 149 mg/dL Final  . HDL 06/21/2017 46  >39 mg/dL Final  . VLDL Cholesterol Cal 06/21/2017 37   5 - 40 mg/dL Final  . LDL Calculated 06/21/2017 102* 0 - 99 mg/dL Final  . Chol/HDL Ratio 06/21/2017 4.0  0.0 - 4.4 ratio Final   Comment:                                   T. Chol/HDL Ratio                                             Men  Women                               1/2 Avg.Risk  3.4    3.3                                   Avg.Risk  5.0    4.4                                2X Avg.Risk  9.6    7.1                                3X Avg.Risk 23.4   11.0   . TSH 06/21/2017 0.045* 0.450 - 4.500 uIU/mL Final  . Vit D, 25-Hydroxy 06/21/2017 33.6  30.0 - 100.0 ng/mL Final   Comment: Vitamin D deficiency has been defined by the [employer] practice guideline as a level of serum 25-OH vitamin D less than 20 ng/mL (1,2). The Endocrine Society went on to further define vitamin D insufficiency as a level between 21 and 29 ng/mL (2). 1. IOM (Institute of Medicine). 2010. Dietary reference    intakes for calcium and D. [place]: The    [employer]. 2. Holick MF, Binkley [place], Bischoff-Ferrari HA, et al.    Evaluation, treatment, and prevention of vitamin D    deficiency: an Endocrine Society clinical practice    guideline. JCEM. 2011 Jul; 96(7):1911-30.   Marland Kitchen Glucose 06/21/2017 97  65 - 99 mg/dL Final  . BUN 06/21/2017 10  8 - 27 mg/dL Final  . Creatinine, Ser 06/21/2017 0.93  0.57 - 1.00 mg/dL Final  . GFR calc non  Af Amer 06/21/2017 64  >59 mL/min/1.73 Final  . GFR calc Af Amer 06/21/2017 74  >59 mL/min/1.73 Final  . BUN/Creatinine Ratio 06/21/2017 11* 12 - 28 Final  . Sodium 06/21/2017 143  134 - 144 mmol/L Final  . Potassium 06/21/2017 4.0  3.5 - 5.2 mmol/L Final  . Chloride 06/21/2017 101  96 - 106 mmol/L Final  . CO2 06/21/2017 26  20 - 29 mmol/L Final  . Calcium 06/21/2017 10.1  8.7 - 10.3 mg/dL Final  . Total Protein 06/21/2017 7.3  6.0 - 8.5 g/dL Final  . Albumin 06/21/2017 4.5  3.6 - 4.8 g/dL Final  . Globulin, Total  06/21/2017 2.8  1.5 - 4.5 g/dL Final  . Albumin/Globulin Ratio 06/21/2017 1.6  1.2 - 2.2 Final  . Bilirubin Total 06/21/2017 0.5  0.0 - 1.2 mg/dL Final  . Alkaline Phosphatase 06/21/2017 86  39 - 117 IU/L Final  . AST 06/21/2017 24  0 - 40 IU/L Final  . ALT 06/21/2017 20  0 - 32 IU/L Final  . Color, UA 06/21/2017 yellow   Final  . Clarity, UA 06/21/2017 clear   Final  . Glucose, UA 06/21/2017 neg   Final  . Bilirubin, UA 06/21/2017 neg   Final  . Ketones, UA 06/21/2017 neg   Final  . Spec Grav, UA 06/21/2017 1.020  1.010 - 1.025 Final  . Blood, UA 06/21/2017 trace intact   Final  . pH, UA 06/21/2017 6.5  5.0 - 8.0 Final  . Protein, UA 06/21/2017 neg   Final  . Urobilinogen, UA 06/21/2017 0.2  0.2 or 1.0 E.U./dL Final  . Nitrite, UA 06/21/2017 neg   Final  . Leukocytes, UA 06/21/2017 Small (1+)* Negative Final  . Organism ID, Bacteria 06/21/2017 NO GROWTH   Final     X-Rays:No results found.  EKG: Orders placed or performed in visit on 06/21/17  . EKG 12-Lead     Hospital Course: Laura [name] is a 66 y.o. who was admitted to [facility]. They were brought to the operating room on 08/09/2017 and underwent Procedure(s): LEFT UNICOMPARTMENTAL KNEE.  Patient tolerated the procedure well and was later transferred to the recovery room and then to the orthopaedic floor for postoperative care.  They were given PO and IV analgesics for pain control following their surgery.  They were given 24 hours of postoperative antibiotics of  Anti-infectives    Start     Dose/Rate Route Frequency Ordered Stop   08/09/17 2330  ceFAZolin (ANCEF) IVPB 2g/100 mL premix     2 g 200 mL/hr over 30 Minutes Intravenous Every 6 hours 08/09/17 2148 08/10/17 0639   08/09/17 1500  ceFAZolin (ANCEF) IVPB 2g/100 mL premix     2 g 200 mL/hr over 30 Minutes Intravenous On call to O.R. 08/09/17 1443 08/09/17 1840   08/09/17 1449  ceFAZolin (ANCEF) 2-4 GM/100ML-% IVPB    Comments:  Bridget Hartshorn   :  cabinet override      08/09/17 1449 08/09/17 1810     and started on DVT prophylaxis in the form of Xarelto.   PT and OT were ordered for postop therapy protocol.  Discharge planning consulted to help with postop disposition and equipment needs.  Patient had a good night on the evening of surgery.  They started to get up OOB with therapy on day one. Hemovac drain was pulled without difficulty.  Patient was seen in rounds on day one and it was felt that as long as  they did well with the remaining sessions of therapy that they would be ready to go home.  Arrangements were made and they were setup to go home on POD 1.  Discharge home - Outpatient Therapy in [place] to start on Friday Oct. 12th. Diet - Cardiac diet Follow up - in 2 weeks Activity - WBAT Dressing - May remove the surgical dressing tomorrow at home and then apply a dry gauze dressing daily. May shower three days following surgery but do not submerge the incision under water. Disposition - Home Condition Upon Discharge - Stable D/C Meds - See DC Summary DVT Prophylaxis Xarelto 10 mg daily for ten days, then change to Aspirin 325 mg daily for two weeks, then reduce to Baby Aspirin 81 mg daily for three additional weeks.  Discharge Instructions    Call MD / Call 911    Complete by:  As directed    If you experience chest pain or shortness of breath, CALL 911 and be transported to the hospital emergency room.  If you develope a fever above 101 F, pus (white drainage) or increased drainage or redness at the wound, or calf pain, call your surgeon's office.   Change dressing    Complete by:  As directed    Change dressing daily with sterile 4 x 4 inch gauze dressing and apply TED hose. Do not submerge the incision under water.   Constipation Prevention    Complete by:  As directed    Drink plenty of fluids.  Prune juice may be helpful.  You may use a stool softener, such as Colace (over the counter) 100 mg twice a day.  Use MiraLax  (over the counter) for constipation as needed.   Diet - low sodium heart healthy    Complete by:  As directed    Discharge instructions    Complete by:  As directed    Xarelto 10 mg daily for ten days, then change to Aspirin 325 mg daily for two weeks, then reduce to Baby Aspirin 81 mg daily for three additional weeks.   Pick up stool softner and laxative for home use following surgery while on pain medications. Do not submerge incision under water. Please use good hand washing techniques while changing dressing each day. May shower starting three days after surgery. Please use a clean towel to pat the incision dry following showers. Continue to use ice for pain and swelling after surgery. Do not use any lotions or creams on the incision until instructed by your surgeon.  Wear both TED hose on both legs during the day every day for three weeks, but may remove the TED hose at night at home.  Postoperative Constipation Protocol  Constipation - defined medically as fewer than three stools per week and severe constipation as less than one stool per week.  One of the most common issues patients have following surgery is constipation.  Even if you have a regular bowel pattern at home, your normal regimen is likely to be disrupted due to multiple reasons following surgery.  Combination of anesthesia, postoperative narcotics, change in appetite and fluid intake all can affect your bowels.  In order to avoid complications following surgery, here are some recommendations in order to help you during your recovery period.  Colace (docusate) - Pick up an over-the-counter form of Colace or another stool softener and take twice a day as long as you are requiring postoperative pain medications.  Take with a full glass of water daily.  If you experience loose stools or diarrhea, hold the colace until you stool forms back up.  If your symptoms do not get better within 1 week or if they get worse, check with  your doctor.  Dulcolax (bisacodyl) - Pick up over-the-counter and take as directed by the product packaging as needed to assist with the movement of your bowels.  Take with a full glass of water.  Use this product as needed if not relieved by Colace only.   MiraLax (polyethylene glycol) - Pick up over-the-counter to have on hand.  MiraLax is a solution that will increase the amount of water in your bowels to assist with bowel movements.  Take as directed and can mix with a glass of water, juice, soda, coffee, or tea.  Take if you go more than two days without a movement. Do not use MiraLax more than once per day. Call your doctor if you are still constipated or irregular after using this medication for 7 days in a row.  If you continue to have problems with postoperative constipation, please contact the office for further assistance and recommendations.  If you experience "the worst abdominal pain ever" or develop nausea or vomiting, please contact the office immediatly for further recommendations for treatment.   Do not put a pillow under the knee. Place it under the heel.    Complete by:  As directed    Do not sit on low chairs, stoools or toilet seats, as it may be difficult to get up from low surfaces    Complete by:  As directed    Driving restrictions    Complete by:  As directed    No driving until released by the physician.   Increase activity slowly as tolerated    Complete by:  As directed    Lifting restrictions    Complete by:  As directed    No lifting until released by the physician.   Patient may shower    Complete by:  As directed    You may shower without a dressing once there is no drainage.  Do not wash over the wound.  If drainage remains, do not shower until drainage stops.   TED hose    Complete by:  As directed    Use stockings (TED hose) for 3 weeks on both leg(s).  You may remove them at night for sleeping.   Weight bearing as tolerated    Complete by:  As  directed    Laterality:  left   Extremity:  Lower     Allergies as of 08/10/2017      Reactions   Dilaudid [hydromorphone Hcl] Other (See Comments)   Confusion;altered state of mind   Levaquin [levofloxacin] Other (See Comments)   Dry throat, no raste   Nsaids    REACTION: per GI pt no longer to use NSAIDS   Promethazine    Legs itching, RESTLESS LEGS      Medication List    STOP taking these medications   multivitamin tablet   vitamin E 400 UNIT capsule     TAKE these medications   acetaminophen 500 MG tablet Commonly known as:  TYLENOL Take 1,000 mg by mouth every 8 (eight) hours as needed (for pain.).   atorvastatin 20 MG tablet Commonly known as:  LIPITOR Take 1 tablet (20 mg total) by mouth daily.   azelastine 0.1 % nasal spray Commonly known as:  ASTELIN Place 2 sprays into both nostrils 2 (two) times daily.  Use in each nostril as directed What changed:  when to take this  reasons to take this  additional instructions   citalopram 10 MG tablet Commonly known as:  CELEXA TAKE 1 TABLET(10 MG) BY MOUTH DAILY   gabapentin 100 MG capsule Commonly known as:  NEURONTIN Take 1 capsule (100 mg total) by mouth at bedtime. What changed:  when to take this  reasons to take this   hydrochlorothiazide 25 MG tablet Commonly known as:  HYDRODIURIL Take 1 tablet (25 mg total) by mouth daily.   levothyroxine 112 MCG tablet Commonly known as:  SYNTHROID, LEVOTHROID TAKE 1 TABLET(112 MCG) BY MOUTH DAILY BEFORE BREAKFAST What changed:  how much to take  how to take this  when to take this  additional instructions   linaclotide 72 MCG capsule Commonly known as:  LINZESS Take 1 capsule (72 mcg total) by mouth daily before breakfast.   loratadine 10 MG tablet Commonly known as:  CLARITIN Take 1 tablet (10 mg total) by mouth daily. What changed:  when to take this  reasons to take this   losartan 50 MG tablet Commonly known as:  COZAAR Take 1  tablet (50 mg total) by mouth daily.   methocarbamol 500 MG tablet Commonly known as:  ROBAXIN Take 1 tablet (500 mg total) by mouth every 6 (six) hours as needed for muscle spasms.   oxybutynin 5 MG/5ML syrup Commonly known as:  DITROPAN Take 5 mLs (5 mg total) by mouth 2 (two) times daily.   oxyCODONE 5 MG immediate release tablet Commonly known as:  Oxy IR/ROXICODONE Take 1-2 tablets (5-10 mg total) by mouth every 4 (four) hours as needed for moderate pain or severe pain.   pantoprazole 40 MG tablet Commonly known as:  PROTONIX Take 1 tablet (40 mg total) by mouth 2 (two) times daily.   rivaroxaban 10 MG Tabs tablet Commonly known as:  XARELTO Take 1 tablet (10 mg total) by mouth daily with breakfast. Xarelto 10 mg daily for ten days, then change to Aspirin 325 mg daily for two weeks, then reduce to Baby Aspirin 81 mg daily for three additional weeks.   SYSTANE 0.4-0.3 % Soln Generic drug:  Polyethyl Glycol-Propyl Glycol Apply 1 drop to eye 3 (three) times daily as needed (for dry eyes.).   traMADol 50 MG tablet Commonly known as:  ULTRAM Take 2 tablets (100 mg total) by mouth daily as needed for moderate pain. For knee pain.            Discharge Care Instructions        Start     Ordered   08/10/17 0000  Weight bearing as tolerated    Question Answer Comment  Laterality left   Extremity Lower      08/10/17 0843   08/10/17 0000  Change dressing    Comments:  Change dressing daily with sterile 4 x 4 inch gauze dressing and apply TED hose. Do not submerge the incision under water.   08/10/17 0843     Follow-up Information    Gaynelle Arabian, MD. Schedule an appointment as soon as possible for a visit on 08/22/2017.   Specialty:  Orthopedic Surgery Contact information: [address] [place] [address] [phone]           Signed: Arlee Muslim, PA-C Orthopaedic Surgery 08/10/2017, 8:44 AM

## 2017-08-10 NOTE — Anesthesia Postprocedure Evaluation (Signed)
Anesthesia Post Note  Patient: Laura [name]  Procedure(s) Performed: LEFT UNICOMPARTMENTAL KNEE (Left )     Patient location during evaluation: PACU Anesthesia Type: Spinal Level of consciousness: awake and alert Pain management: pain level controlled Vital Signs Assessment: post-procedure vital signs reviewed and stable Respiratory status: spontaneous breathing and respiratory function stable Cardiovascular status: blood pressure returned to baseline and stable Postop Assessment: no headache, no backache, spinal receding and no apparent nausea or vomiting Anesthetic complications: no    Last Vitals:  Vitals:   08/09/17 2257 08/09/17 2359  BP: 127/75 122/72  Pulse: 63 71  Resp: 18 18  Temp: 36.6 C 36.7 C  SpO2: 100% 100%    Last Pain:  Vitals:   08/09/17 2359  TempSrc: Oral  PainSc:                  Phillips Grout

## 2017-08-10 NOTE — Op Note (Signed)
OPERATIVE REPORT-UNICOMPARTMENTAL ARTHROPLASTY  PREOPERATIVE DIAGNOSIS: Medial compartment osteoarthritis, Left knee  POSTOPERATIVE DIAGNOSIS: Medial compartment osteoarthritis, Left knee  PROCEDURE:Left knee medial unicompartmental arthroplasty.   SURGEON: Ollen Gross, MD   ASSISTANT: Avel Peace, PA-C  ANESTHESIA:  Adductor canal block and spinal.   ESTIMATED BLOOD LOSS: Minimal.   DRAINS: Hemovac x1.   TOURNIQUET TIME:   Total Tourniquet Time Documented: Thigh (Left) - 42 minutes Total: Thigh (Left) - 42 minutes    COMPLICATIONS: None.   CONDITION: Stable to recovery.   BRIEF CLINICAL NOTE:Laura [name] is a 66 y.o. female, who has  significant isolated medial compartment arthritis of the Left knee. The patient has had nonoperative management including injections of cortisone and viscous supplements. Unfortunately, the pain persists.  Radiograph showed isolated medial compartment bone-on-bone arthritis  with normal-appearing patellofemoral and lateral compartments. The patient presents now for left knee unicompartmental arthroplasty.   PROCEDURE IN DETAIL: After successful administration of  Adductor canal block and spinal anesthetic, a tourniquet was placed high on the  Left thigh and the Left lower extremity prepped and draped in usual sterile fashion. Extremity was wrapped in an Esmarch, knee flexed, and tourniquet inflated to 300 mmHg.       A midline incision was made with a 10 blade through subcutaneous  tissue to the extensor mechanism. A fresh blade was used to make a  medial parapatellar arthrotomy. Soft tissue on the proximal medial  tibia subperiosteally elevated to the joint line with a knife and into  the semimembranosus bursa with a Cobb elevator. The patella was  subluxed laterally, and the knee flexed 90 degrees. The ACL was intact.  The marginal osteophytes on the medial femur and tibia were removed with  a rongeur. The medial meniscus  was also removed. The femoral cutting  block for the conformis unicompartmental knee system was placed along  the femur. There was excellent fit. I traced the outline. We then  removed any remaining cartilage within this outline. We then placed the  cutting block again and pinned in position. The posterior femoral cut  was made, it was approximately 5 mm. The lug holes for the femoral  component were then drilled through the cutting block. The cutting  block was subsequently removed. We then utilized the high speed burr to  create a small trough at the superior aspect of the component tomake it inset and would not overhang the cartilage. The trial was placed,  it had excellent fit. The trial was subsequently removed.       The trial was placed again and the B chip was placed. There was  excellent balance throughout full motion. Also with excellent fit on  the tibia. This was removed as was the femoral trial. A curette was  used to remove any remaining cartilage from the tibia. The tibial  cutting block was then placed and there was a perfect fit on the tibial  surface. The appropriate slope was placed and it was pinned in  position. The reciprocating saw was used to make the central cut and  then the oscillating saw used to make the horizontal cut. The bone  fragment was then removed. The tibial trial was placed and had perfect  fit on the tibia. We then drilled the 2 lug holes and did the keel punch.  We then placed tibia trial and femur trial, and a 6 mm trial insert. There was  excellent stability throughout full range of motion and no impingement.  The trial  was then removed. We drilled small holes in the distal  femur in order to create more conduits for the cement. The cut bone  surfaces were thoroughly irrigated with pulsatile lavage while the  cement was mixed on the back table. We then cemented the tibial  component into place, impacted it and removed the extruded cement. The   same was done for the femoral component. Trial 6-mm inserts placed,  knee held in full extension, and all extruded cement removed. While the  cement was hardening, I injected the extensor mechanism, periosteum of  the femur and subcu tissues, a total of 20 mL of Exparel mixed with 30  mL of saline and then did an additional injection of 20 mL of 0.25%  Marcaine into the same tissues. When the cement had fully hardened,  then the permanent polyethylene was placed in tibial tray. There was  excellent stability throughout full range of motion with no lift off the  component and no evidence of any impingement.       Wound was copiously irrigated with saline solution, and the arthrotomy closed over a Hemovac drain with a running #1 V-Loc suture. The subcutaneous was closed with  interrupted 2-0 Vicryl and subcuticular running 4-0 Monocryl. The drain  was hooked to suction. Incision cleaned and dried and Steri-Strips and  a bulky sterile dressing applied. The tourniquet was released after a  total time of 42 minutes. This was done after closing the extensor  mechanism. The wound was closed and a bulky sterile dressing was  applied. The operative limb was placed into a knee immobilizer, and the patient awakened and transported to recovery room in stable condition.       Please note that a surgical assistant was a medical necessity for this  procedure in order to perform it in a safe and expeditious manner.  Assistance was necessary for retracting vital ligaments, neurovascular  structures, as well as for proper positioning of the limb to allow for  appropriate bone cuts and appropriate placement of the prosthesis.    Gus Rankin Cashtyn Pouliot, MD

## 2017-08-10 NOTE — Evaluation (Signed)
Physical Therapy One Time Evaluation Patient Details Name: Laura [name] MRN: [id number] DOB: [birth year] Today's Date: 08/10/2017   History of Present Illness  Pt is a 66 year old female admitted for Left knee medial unicompartmental arthroplasty  Clinical Impression  Patient evaluated by Physical Therapy with no further acute PT needs identified. All education has been completed and the patient has no further questions.  Pt ambulated in hallway and practiced one step.  Pt also performed LE exercises and provided with HEP handout.  Pt to d/c home today and start with outpatient PT. PT is signing off. Thank you for this referral.     Follow Up Recommendations Outpatient PT;DC plan and follow up therapy as arranged by surgeon    Equipment Recommendations  Rolling walker with 5" wheels    Recommendations for Other Services       Precautions / Restrictions Precautions Precautions: Fall;Knee Required Braces or Orthoses: Knee Immobilizer - Left Restrictions Weight Bearing Restrictions: No Other Position/Activity Restrictions: WBAT      Mobility  Bed Mobility Overal bed mobility: Needs Assistance Bed Mobility: Supine to Sit     Supine to sit: Min assist     General bed mobility comments: pt up in recliner on arrival  Transfers Overall transfer level: Needs assistance Equipment used: Rolling walker (2 wheeled) Transfers: Sit to/from Stand Sit to Stand: Min guard         General transfer comment: verbal cues for UE and LE positioning  Ambulation/Gait Ambulation/Gait assistance: Min guard Ambulation Distance (Feet): 150 Feet Assistive device: Rolling walker (2 wheeled) Gait Pattern/deviations: Step-through pattern;Decreased stance time - left;Antalgic     General Gait Details: verbal cues for sequencing, RW positioning  Stairs Stairs: Yes Stairs assistance: Min guard Stair Management: Step to pattern;Forwards;With walker Number of Stairs: 1 General stair  comments: verbal cues for safe technique and RW positioning, pt performed twice  Wheelchair Mobility    Modified Rankin (Stroke Patients Only)       Balance                                             Pertinent Vitals/Pain Pain Assessment: 0-10 Pain Score: 3  Pain Location: L knee Pain Descriptors / Indicators: Aching;Sore;Tightness Pain Intervention(s): Limited activity within patient's tolerance;Monitored during session;Repositioned;Ice applied    Home Living Family/patient expects to be discharged to:: Private residence Living Arrangements: Spouse/significant other Available Help at Discharge: Family Type of Home: House Home Access: Stairs to enter Entrance Stairs-Rails: None Technical brewer of Steps: 1 Home Layout: One level Home Equipment: None      Prior Function Level of Independence: Independent               Hand Dominance        Extremity/Trunk Assessment   Upper Extremity Assessment Upper Extremity Assessment: Overall WFL for tasks assessed    Lower Extremity Assessment Lower Extremity Assessment: LLE deficits/detail LLE Deficits / Details: unable to perform SLR, fair quad contraction; AAROM L knee flexion approx 35* limited by pain       Communication   Communication: No difficulties  Cognition Arousal/Alertness: Awake/alert Behavior During Therapy: WFL for tasks assessed/performed Overall Cognitive Status: Within Functional Limits for tasks assessed  General Comments      Exercises Total Joint Exercises Ankle Circles/Pumps: AROM;Both;10 reps Quad Sets: AROM;Both;10 reps Towel Squeeze: AROM;Both;10 reps Short Arc QuadSinclair Ship;Left;10 reps Heel Slides: AAROM;Left;Seated;10 reps Hip ABduction/ADduction: AAROM;Left;10 reps Straight Leg Raises: AAROM;Left;10 reps   Assessment/Plan    PT Assessment All further PT needs can be met in the next venue of  care  PT Problem List Decreased strength;Decreased range of motion;Decreased mobility;Decreased knowledge of use of DME;Pain       PT Treatment Interventions      PT Goals (Current goals can be found in the Care Plan section)  Acute Rehab PT Goals Patient Stated Goal: return to independence PT Goal Formulation: All assessment and education complete, DC therapy    Frequency     Barriers to discharge        Co-evaluation               AM-PAC PT "6 Clicks" Daily Activity  Outcome Measure Difficulty turning over in bed (including adjusting bedclothes, sheets and blankets)?: A Little Difficulty moving from lying on back to sitting on the side of the bed? : Unable Difficulty sitting down on and standing up from a chair with arms (e.g., wheelchair, bedside commode, etc,.)?: A Little Help needed moving to and from a bed to chair (including a wheelchair)?: A Little Help needed walking in hospital room?: A Little Help needed climbing 3-5 steps with a railing? : A Little 6 Click Score: 16    End of Session Equipment Utilized During Treatment: Gait belt Activity Tolerance: Patient tolerated treatment well Patient left: in chair;with call bell/phone within reach Nurse Communication: Mobility status PT Visit Diagnosis: Difficulty in walking, not elsewhere classified (R26.2);Pain Pain - Right/Left: Left Pain - part of body: Knee    Time: 3968-8648 PT Time Calculation (min) (ACUTE ONLY): 26 min   Charges:   PT Evaluation $PT Eval Low Complexity: 1 Low PT Treatments $Therapeutic Exercise: 8-22 mins   PT G Codes:   PT G-Codes **NOT FOR INPATIENT CLASS** Functional Assessment Tool Used: AM-PAC 6 Clicks Basic Mobility;Clinical judgement Functional Limitation: Mobility: Walking and moving around Mobility: Walking and Moving Around Current Status (E7207): At least 20 percent but less than 40 percent impaired, limited or restricted Mobility: Walking and Moving Around Goal Status  [phone]): At least 1 percent but less than 20 percent impaired, limited or restricted Mobility: Walking and Moving Around Discharge Status [phone]): At least 1 percent but less than 20 percent impaired, limited or restricted    Carmelia Bake, PT, DPT 08/10/2017 Pager: [phone]  York Ram E 08/10/2017, 12:17 PM

## 2017-08-10 NOTE — Plan of Care (Signed)
Problem: Physical Regulation: Goal: Postoperative complications will be avoided or minimized Outcome: Progressing No post operative Complications reported  Problem: Pain Management: Goal: Pain level will decrease with appropriate interventions Outcome: Progressing Medicated twice with PRN pain medications with moderate relief  Problem: Skin Integrity: Goal: Signs of wound healing will improve Outcome: Progressing No wound or skin issues reported  Problem: Safety: Goal: Ability to remain free from injury will improve Outcome: Progressing No fall or injury noted, safety precautions maintained  Problem: Bowel/Gastric: Goal: Will not experience complications related to bowel motility Outcome: Progressing No bowel motility complications reported

## 2017-08-10 NOTE — Care Management Obs Status (Signed)
MEDICARE OBSERVATION STATUS NOTIFICATION   Patient Details  Name: Laura [name] MRN: [id number] Date of Birth: 1950/12/02   Medicare Observation Status Notification Given:  Yes    MahabirOlegario Messier, RN 08/10/2017, 1:05 PM

## 2017-08-11 ENCOUNTER — Encounter (HOSPITAL_COMMUNITY): Payer: Self-pay

## 2017-08-11 ENCOUNTER — Other Ambulatory Visit: Payer: Self-pay | Admitting: Family Medicine

## 2017-08-11 ENCOUNTER — Telehealth (HOSPITAL_COMMUNITY): Payer: Self-pay

## 2017-08-11 ENCOUNTER — Ambulatory Visit (HOSPITAL_COMMUNITY): Payer: Medicare Other

## 2017-08-11 DIAGNOSIS — Z1231 Encounter for screening mammogram for malignant neoplasm of breast: Secondary | ICD-10-CM

## 2017-08-11 NOTE — Telephone Encounter (Signed)
Called and left pt a message to let her know that our office is open today but if she wanted to reschedule her evaluation she should call the front desk.   Jac Canavan PT, DPT

## 2017-08-11 NOTE — Progress Notes (Signed)
diag tomo

## 2017-08-11 NOTE — Telephone Encounter (Signed)
Pt advised that diagnostic mammogram entered, she will schedule

## 2017-08-14 ENCOUNTER — Encounter: Payer: Medicare Other | Admitting: Family Medicine

## 2017-08-14 ENCOUNTER — Ambulatory Visit (HOSPITAL_COMMUNITY): Payer: Medicare Other | Admitting: Physical Therapy

## 2017-08-16 ENCOUNTER — Encounter (HOSPITAL_COMMUNITY): Payer: Medicare Other | Admitting: Physical Therapy

## 2017-08-18 ENCOUNTER — Ambulatory Visit (HOSPITAL_COMMUNITY): Payer: Medicare Other | Attending: Orthopedic Surgery

## 2017-08-18 ENCOUNTER — Encounter (HOSPITAL_COMMUNITY): Payer: Self-pay

## 2017-08-18 DIAGNOSIS — M25562 Pain in left knee: Secondary | ICD-10-CM

## 2017-08-18 DIAGNOSIS — M25662 Stiffness of left knee, not elsewhere classified: Secondary | ICD-10-CM | POA: Diagnosis not present

## 2017-08-18 DIAGNOSIS — G8929 Other chronic pain: Secondary | ICD-10-CM | POA: Diagnosis not present

## 2017-08-18 DIAGNOSIS — R29898 Other symptoms and signs involving the musculoskeletal system: Secondary | ICD-10-CM

## 2017-08-18 DIAGNOSIS — M6281 Muscle weakness (generalized): Secondary | ICD-10-CM | POA: Insufficient documentation

## 2017-08-18 NOTE — Patient Instructions (Signed)
  Quad Set  Sit comfortably on the floor with your legs stretched out in front of you.  Place a rolled up towel under your affected knee.  Focus on straightening your knee, contracting your leg muscles, and pushing down into the towel roll.  Relax and repeat.    10 times, 3 second hold, 3 sets, twice daily    HEEL SLIDES - SUPINE  Lying on your back with knees straight, slide the affected heel towards your buttock as you bend your knee.   Hold a gentle stretch in this position and then return to original position. 10 times, 3 second hold, 3 sets, twice daily    GLUTE SET - SUPINE  While lying on your back, squeeze your buttocks and hold. Repeat.  15 times, 3 second hold, 2 sets, twice daily

## 2017-08-18 NOTE — Therapy (Signed)
[facility] [address] [place], [place], [address] Phone: [phone]   Fax:  [phone]  Physical Therapy Evaluation  Patient Details  Name: Laura [name] MRN: [id number] Date of Birth: 07-Aug-1951 Referring Provider: Ollen Gross MD  Encounter Date: 08/18/2017      PT End of Session - 08/18/17 1050    Visit Number 1   Number of Visits 13   Date for PT Re-Evaluation 09/18/17   Authorization Type UHC Medicare   Authorization Time Period 08/18/2017 -09/30/2017   Authorization - Visit Number 1   Authorization - Number of Visits 10   PT Start Time 0945   PT Stop Time 1030   PT Time Calculation (min) 45 min   Equipment Utilized During Treatment Other (comment)  L knee brace Doff for session   Activity Tolerance Patient tolerated treatment well   Behavior During Therapy [facility] [place] for tasks assessed/performed      Past Medical History:  Diagnosis Date  . Anxiety   . Cystitis   . Depression   . Gastritis   . GERD (gastroesophageal reflux disease)   . Hashimoto's thyroiditis    Hx   . Hyperlipidemia   . Hypertension   . Hypothyroidism   . Normocytic anemia 2009 Hb 9.9-11.1  . Osteopenia   . Shoulder pain   . Urinary incontinence     Past Surgical History:  Procedure Laterality Date  . Bilateral tubal ligation    . BREAST EXCISIONAL BIOPSY  2010   Excisional biopsy of benign left breast mass -lopoma   . BREAST SURGERY     left nreast biopsy  . COLONOSCOPY  08/26/2011   SLF: 1. Internal hemorrhoids  . COLONOSCOPY N/A 10/06/2015   IEP:PIRJ fissure or internal hemorrhoids/mild sigmoid colitis. benign colonic path   . colonscopy  2005   Dr. Katrinka Blazing  . ESOPHAGOGASTRODUODENOSCOPY  12/2009   chronic gastritis  . ESOPHAGOGASTRODUODENOSCOPY N/A 05/17/2017   Procedure: ESOPHAGOGASTRODUODENOSCOPY (EGD);  Surgeon: West Bali, MD;  [facility];  Service: Endoscopy;  Laterality: N/A;  830   . GIVENS CAPSULE STUDY N/A  05/30/2017   Procedure: GIVENS CAPSULE STUDY;  Surgeon: West Bali, MD;  [facility];  Service: Endoscopy;  Laterality: N/A;  700  . left knee surgery Left March 1,2017  . PARTIAL KNEE ARTHROPLASTY Left 08/09/2017   Procedure: LEFT UNICOMPARTMENTAL KNEE;  Surgeon: Ollen Gross, MD;  [facility]: [facility];  Service: Orthopedics;  Laterality: Left;  . Resection of left lobe of thyroid    . right carpal tunnel release    . rt. knee athroscopy  2004  . SAVORY DILATION N/A 05/17/2017   Procedure: SAVORY DILATION;  Surgeon: West Bali, MD;  [facility];  Service: Endoscopy;  Laterality: N/A;  . TOTAL ABDOMINAL HYSTERECTOMY  1994  . UMBILICAL HERNIA REPAIR    . Urethral dilation for stenosis  2009    There were no vitals filed for this visit.       Subjective Assessment - 08/18/17 0946    Subjective Patient reports to physical therapy today p/o Left knee medial UKA completed on October 10th. She states that she was not expecting to get a TKR when going into surgery, she thought she was going to get a partial "but it must have been worse when they got in there." She reports so far doing pretty good, however, have had moments where she has just broke down crying. She ambulate with a  front wheel walker in home and community. She notes that she gets really stiff with resting.   Pertinent History UKA on the 08-09-17, Hx of Lt. meniscus repair    Limitations Walking;Standing   How long can you sit comfortably? No issues with elevation of knee   How long can you stand comfortably? 45 minutes   How long can you walk comfortably? 30 minutes   Patient Stated Goals To improve pain and get motion and strength back   Currently in Pain? Yes   Pain Score 7    Pain Location Knee   Pain Orientation Left   Pain Descriptors / Indicators Aching;Constant;Tightness   Pain Type Surgical pain   Pain Onset 1 to 4 weeks ago   Pain Frequency Constant   Aggravating Factors  Resting in  one place   Pain Relieving Factors Walking to get movement becomes less stiff             [facility] PT Assessment - 08/18/17 0001      Assessment   Medical Diagnosis Lt knee OA, Medial UKA   Referring Provider Ollen Gross MD   Onset Date/Surgical Date 08/09/17   Next MD Visit 08/22/2017   Prior Therapy yes; Lt knee pain 2017     Precautions   Precautions None   Required Braces or Orthoses Other Brace/Splint   Knee Immobilizer - Left Other (comment)  when leaevs the hosue     Restrictions   Weight Bearing Restrictions No     Balance Screen   Has the patient fallen in the past 6 months No   Has the patient had a decrease in activity level because of a fear of falling?  No   Is the patient reluctant to leave their home because of a fear of falling?  No     Home Tourist information centre manager residence   Living Arrangements Spouse/significant other   Type of Home House   Additional Comments 1 step into home     Prior Function   Level of Independence Independent   Vocation Retired   Leisure walking, gym, Landscape architect   Overall Cognitive Status Within Functional Limits for tasks assessed     Observation/Other Assessments   Focus on Therapeutic Outcomes (FOTO)  77% limited     AROM   Left Knee Extension 30   Left Knee Flexion 70     Strength   Right Knee Flexion 5/5   Right Knee Extension 5/5   Left Knee Flexion 3+/5   Left Knee Extension 3+/5     Ambulation/Gait   Ambulation Distance (Feet) 306 Feet   Assistive device Rolling walker   Gait Pattern Step-through pattern   Gait velocity .72 m/s     Balance   Balance Assessed Yes     Timed Up and Go Test   TUG Normal TUG   Normal TUG (seconds) 37.54            Objective measurements completed on examination: See above findings.          [facility] Adult PT Treatment/Exercise - 08/18/17 0001      Ambulation/Gait   Gait Comments Gait training with SPC to utilize at home in  tight spaces for safety; Step through patter in Rt. UE     Knee/Hip Exercises: Supine   Quad Sets 10 reps;2 sets;AROM   Short Arc [employer] 10 reps   Short Arc [employer] Limitations Support bolster light manual  overpressure   Heel Slides 10 reps;2 sets;Left   Other Supine Knee/Hip Exercises Glute set 10 x 2 sets, 5 sec hold     Manual Therapy   Manual Therapy Soft tissue mobilization;Passive ROM;Edema management   Manual therapy comments performed seperately from all other interventions   Edema Management To Lt. knee light pressure for pain management and swelling   Soft tissue mobilization Light posterior knee for pain   Passive ROM Knee flexion and extension supine                PT Education - 08/18/17 1050    Education provided Yes   Education Details Examination findings, benefit of mobility and expectations for therapy session, HEP   Person(s) Educated Patient   Methods Explanation;Demonstration;Handout   Comprehension Verbalized understanding;Returned demonstration          PT Short Term Goals - 08/18/17 1053      PT SHORT TERM GOAL #1   Title Pt to be independent with HEP for Lt knee ROM and strengthening   Time 3   Period Weeks   Status New   Target Date 09/08/17     PT SHORT TERM GOAL #2   Title Pt to have 15-100 degress Lt knee mobility for functional gait.   Time 3   Period Weeks   Status New     PT SHORT TERM GOAL #3   Title Patient to have improved MMT grade by +1 at least to improve functional mobility and safety with LRAD.    Time 3   Period Weeks   Status New           PT Long Term Goals - 08/18/17 1056      PT LONG TERM GOAL #1   Title Pt to have 0-130 degrees of Lt. knee ROM to return to activities and exercise regimen prior to surgery.    Time 6   Period Weeks   Status New     PT LONG TERM GOAL #2   Title Patient will have improved TUG gat speed by at least 10 seconds with LRAD.    Time 6   Period Weeks   Status New      PT LONG TERM GOAL #3   Title Patient will have at least 4+/5 strength of Lt knee for functional daily tasks.      PT LONG TERM GOAL #4   Title Patient to have FOTO score improvement by at least 20% to indicate improvement in overall function daily with ADLs.      PT LONG TERM GOAL #5   Title Pt to ambulate up/down 4 steps with hand rail with alternating steps.    Time 6   Period Weeks   Status New                Plan - 08/18/17 1105    Clinical Impression Statement Patient is a pleasant 66 year old woman presenting to PT post op Lt. medial UKA. Patient intially unsure what procedure she had done, however, PT descibed order and assured patient she had a medial UKA completed. She is currently wearing a supportive restrictive brace on the Lt knee when leaving her home. Laura [name] presents with decreased Lt. knee mobility, strength, balance and stabilization and will benefit from skilled PT to address the above mentioned to improve pain, quality of movement and safety during ambulation so that she can return to her regular exercise regimen of walking, going to the gym and  swimming.    History and Personal Factors relevant to plan of care: Hx of Lt knee mensicus repair   Clinical Presentation Evolving   Clinical Presentation due to: (+) motivated, active, determined; (-) chronic pain, mult injuries   Clinical Decision Making Low   Rehab Potential Good   PT Frequency 3x / week   PT Duration 2 weeks   PT Treatment/Interventions ADLs/Self Care Home Management;Cryotherapy;Electrical Stimulation;Gait training;Stair training;Functional mobility training;Therapeutic activities;Therapeutic exercise;Balance training;Neuromuscular re-education;Patient/family education;Manual techniques;Passive range of motion;Energy conservation   PT Next Visit Plan Review Eval, goals and HEP; exercises focused on ROM and quad strengthening; pain managment as needed    PT Home Exercise Plan Eval: glute set 2x15,  quad set 3x10, heel slides 3x10   Recommended Other Services 2x/week for 3 weeks after initial 2 weeks    Consulted and Agree with Plan of Care Patient      Patient will benefit from skilled therapeutic intervention in order to improve the following deficits and impairments:  Abnormal gait, Decreased balance, Decreased endurance, Decreased range of motion, Decreased strength, Difficulty walking, Increased edema, Pain, Decreased mobility  Visit Diagnosis: Stiffness of left knee, not elsewhere classified  Acute pain of left knee  Muscle weakness (generalized)  Decreased strength involving knee joint      G-Codes - 09-06-17 1110    Functional Assessment Tool Used (Outpatient Only) Foto   Functional Limitation Mobility: Walking and moving around   Mobility: Walking and Moving Around Current Status [phone]) At least 60 percent but less than 80 percent impaired, limited or restricted   Mobility: Walking and Moving Around Goal Status [phone]) At least 40 percent but less than 60 percent impaired, limited or restricted       Problem List Patient Active Problem List   Diagnosis Date Noted  . OA (osteoarthritis) of knee 08/09/2017  . Anemia 07/07/2017  . Pre-op examination 06/23/2017  . Constipation 02/05/2016  . Knee pain, left 09/08/2015  . Allergic rhinitis 10/08/2014  . GAD (generalized anxiety disorder) 09/23/2014  . POSTSURGICAL HYPOTHYROIDISM 06/22/2010  . Normocytic anemia 08/04/2009  . GERD 04/20/2009  . Hyperlipidemia LDL goal <100 05/16/2008  . Essential hypertension 05/16/2008  . OSTEOPENIA 05/16/2008  . Urinary incontinence 05/16/2008   Candise Che PT, DPT 11:59 AM, September 06, 2017 [phone]  [facility] [address] [place], [place], [address] Phone: [phone]   Fax:  [phone]  Name: Laura [name] MRN: [id number] Date of Birth: [birth year]

## 2017-08-22 ENCOUNTER — Telehealth: Payer: Self-pay

## 2017-08-22 NOTE — Telephone Encounter (Signed)
Telephone outreach to patient to discuss community resource referral. Left message for return call.    Sherle PoeNicole Yamilet Mcfayden, B.A.  Care Guide [phone]

## 2017-08-23 ENCOUNTER — Ambulatory Visit (HOSPITAL_COMMUNITY): Payer: Medicare Other | Admitting: Physical Therapy

## 2017-08-23 DIAGNOSIS — M6281 Muscle weakness (generalized): Secondary | ICD-10-CM | POA: Diagnosis not present

## 2017-08-23 DIAGNOSIS — M25662 Stiffness of left knee, not elsewhere classified: Secondary | ICD-10-CM | POA: Diagnosis not present

## 2017-08-23 DIAGNOSIS — M25562 Pain in left knee: Secondary | ICD-10-CM | POA: Diagnosis not present

## 2017-08-23 DIAGNOSIS — G8929 Other chronic pain: Secondary | ICD-10-CM | POA: Diagnosis not present

## 2017-08-23 DIAGNOSIS — R29898 Other symptoms and signs involving the musculoskeletal system: Secondary | ICD-10-CM | POA: Diagnosis not present

## 2017-08-23 NOTE — Therapy (Signed)
[facility] [address] [place], [place], [address] Phone: [phone]   Fax:  [phone]  Physical Therapy Treatment  Patient Details  Name: LASHAY OSBORNE MRN: [id number] Date of Birth: 1951/09/05 Referring Provider: Ollen Gross MD  Encounter Date: 08/23/2017      PT End of Session - 08/23/17 1123    Visit Number 2   Number of Visits 13   Date for PT Re-Evaluation 09/18/17   Authorization Type UHC Medicare   Authorization Time Period 08/18/2017 -09/30/2017   Authorization - Visit Number 2   Authorization - Number of Visits 10   PT Start Time 1030   PT Stop Time 1115   PT Time Calculation (min) 45 min   Equipment Utilized During Treatment Other (comment)  L knee brace Doff for session   Activity Tolerance Patient tolerated treatment well   Behavior During Therapy [facility] for tasks assessed/performed      Past Medical History:  Diagnosis Date  . Anxiety   . Cystitis   . Depression   . Gastritis   . GERD (gastroesophageal reflux disease)   . Hashimoto's thyroiditis    Hx   . Hyperlipidemia   . Hypertension   . Hypothyroidism   . Normocytic anemia 2009 Hb 9.9-11.1  . Osteopenia   . Shoulder pain   . Urinary incontinence     Past Surgical History:  Procedure Laterality Date  . Bilateral tubal ligation    . BREAST EXCISIONAL BIOPSY  2010   Excisional biopsy of benign left breast mass -lopoma   . BREAST SURGERY     left nreast biopsy  . COLONOSCOPY  08/26/2011   SLF: 1. Internal hemorrhoids  . COLONOSCOPY N/A 10/06/2015   ONG:EXBM fissure or internal hemorrhoids/mild sigmoid colitis. benign colonic path   . colonscopy  2005   Dr. Katrinka Blazing  . ESOPHAGOGASTRODUODENOSCOPY  12/2009   chronic gastritis  . ESOPHAGOGASTRODUODENOSCOPY N/A 05/17/2017   Procedure: ESOPHAGOGASTRODUODENOSCOPY (EGD);  Surgeon: West Bali, MD;  [facility];  Service: Endoscopy;  Laterality: N/A;  830   . GIVENS CAPSULE STUDY N/A  05/30/2017   Procedure: GIVENS CAPSULE STUDY;  Surgeon: West Bali, MD;  [facility];  Service: Endoscopy;  Laterality: N/A;  700  . left knee surgery Left March 1,2017  . PARTIAL KNEE ARTHROPLASTY Left 08/09/2017   Procedure: LEFT UNICOMPARTMENTAL KNEE;  Surgeon: Ollen Gross, MD;  [facility];  Service: Orthopedics;  Laterality: Left;  . Resection of left lobe of thyroid    . right carpal tunnel release    . rt. knee athroscopy  2004  . SAVORY DILATION N/A 05/17/2017   Procedure: SAVORY DILATION;  Surgeon: West Bali, MD;  [facility];  Service: Endoscopy;  Laterality: N/A;  . TOTAL ABDOMINAL HYSTERECTOMY  1994  . UMBILICAL HERNIA REPAIR    . Urethral dilation for stenosis  2009    There were no vitals filed for this visit.      Subjective Assessment - 08/23/17 1038    Subjective Pt states she went back to MD and got a good report.  Currently with 7/10 pain in her Lt knee.  Reports complaince with HEP.  Comes today using SPC.   Currently in Pain? Yes   Pain Score 7    Pain Location Knee   Pain Orientation Left   Pain Descriptors / Indicators Aching;Tightness   Pain Type Surgical pain  [facility] Adult PT Treatment/Exercise - 08/23/17 0001      Knee/Hip Exercises: Stretches   Knee: Self-Stretch to increase Flexion Left;10 seconds   Knee: Self-Stretch Limitations 10 reps onto 12" step     Knee/Hip Exercises: Standing   Gait Training SPC 226 feet with SPC     Knee/Hip Exercises: Seated   Long Arc Quad Left;10 reps   Sit to [employer]Sand 5 reps;without UE support     Knee/Hip Exercises: Supine   Quad Sets 10 reps;2 sets;AROM   Short Arc [employer]Quad Sets 10 reps   Heel Slides 10 reps;2 sets;Left   Knee Extension Limitations 10  was lacking 30 degrees   Knee Flexion Limitations 80  was 70 degrees     Knee/Hip Exercises: Prone   Prone Knee Hang 5 minutes   Prone Knee Hang Limitations with manual to posterior  knee and hamstring     Manual Therapy   Manual Therapy Soft tissue mobilization;Passive ROM;Edema management   Manual therapy comments performed seperately from all other interventions   Edema Management To Lt. knee light pressure for pain management and swelling   Soft tissue mobilization Light posterior knee for pain   Passive ROM Knee flexion and extension supine                PT Education - 08/23/17 1118    Education provided Yes   Education Details reviewed HEP and goals per intial evaluation   Person(s) Educated Patient   Methods Explanation;Demonstration;Tactile cues;Verbal cues;Handout   Comprehension Verbalized understanding;Verbal cues required;Returned demonstration;Tactile cues required;Need further instruction          PT Short Term Goals - 08/18/17 1053      PT SHORT TERM GOAL #1   Title Pt to be independent with HEP for Lt knee ROM and strengthening   Time 3   Period Weeks   Status New   Target Date 09/08/17     PT SHORT TERM GOAL #2   Title Pt to have 15-100 degress Lt knee mobility for functional gait.   Time 3   Period Weeks   Status New     PT SHORT TERM GOAL #3   Title Patient to have improved MMT grade by +1 at least to improve functional mobility and safety with LRAD.    Time 3   Period Weeks   Status New           PT Long Term Goals - 08/18/17 1056      PT LONG TERM GOAL #1   Title Pt to have 0-130 degrees of Lt. knee ROM to return to activities and exercise regimen prior to surgery.    Time 6   Period Weeks   Status New     PT LONG TERM GOAL #2   Title Patient will have improved TUG gat speed by at least 10 seconds with LRAD.    Time 6   Period Weeks   Status New     PT LONG TERM GOAL #3   Title Patient will have at least 4+/5 strength of Lt knee for functional daily tasks.      PT LONG TERM GOAL #4   Title Patient to have FOTO score improvement by at least 20% to indicate improvement in overall function daily with  ADLs.      PT LONG TERM GOAL #5   Title Pt to ambulate up/down 4 steps with hand rail with alternating steps.    Time 6   Period Weeks  Status New               Plan - 08/23/17 1210    Clinical Impression Statement reviewed initial evaluation goals and HEP.  Pt is progressing well as compared to initial visit with gain in ROM and now ambulating with a SPC.  Manual completed with noted restrictions perimeter of knee due to adhesions/scar tissue.  Pt with noted discomfort in end range when completing AAROM in both directions.  ENcouraged to continue ROM exercises.     Rehab Potential Good   PT Frequency 3x / week   PT Duration 2 weeks   PT Treatment/Interventions ADLs/Self Care Home Management;Cryotherapy;Electrical Stimulation;Gait training;Stair training;Functional mobility training;Therapeutic activities;Therapeutic exercise;Balance training;Neuromuscular re-education;Patient/family education;Manual techniques;Passive range of motion;Energy conservation   PT Next Visit Plan Continue to progress with primary focus on ROM at this point.  progress to functional strengtheing and continue manual as needed.   PT Home Exercise Plan Eval: glute set 2x15, quad set 3x10, heel slides 3x10   Consulted and Agree with Plan of Care Patient      Patient will benefit from skilled therapeutic intervention in order to improve the following deficits and impairments:  Abnormal gait, Decreased balance, Decreased endurance, Decreased range of motion, Decreased strength, Difficulty walking, Increased edema, Pain, Decreased mobility  Visit Diagnosis: Stiffness of left knee, not elsewhere classified  Acute pain of left knee  Muscle weakness (generalized)     Problem List Patient Active Problem List   Diagnosis Date Noted  . OA (osteoarthritis) of knee 08/09/2017  . Anemia 07/07/2017  . Pre-op examination 06/23/2017  . Constipation 02/05/2016  . Knee pain, left 09/08/2015  . Allergic rhinitis  10/08/2014  . GAD (generalized anxiety disorder) 09/23/2014  . POSTSURGICAL HYPOTHYROIDISM 06/22/2010  . Normocytic anemia 08/04/2009  . GERD 04/20/2009  . Hyperlipidemia LDL goal <100 05/16/2008  . Essential hypertension 05/16/2008  . OSTEOPENIA 05/16/2008  . Urinary incontinence 05/16/2008   Lurena Nida, PTA/CLT [phone]  Lurena Nida 08/23/2017, 12:13 PM  [facility] [address] [place], [place], [address] Phone: [phone]   Fax:  [phone]  Name: ROSALEAH PERSON MRN: [id number] Date of Birth: January 29, 1951

## 2017-08-25 ENCOUNTER — Ambulatory Visit (HOSPITAL_COMMUNITY): Payer: Medicare Other

## 2017-08-25 DIAGNOSIS — M25662 Stiffness of left knee, not elsewhere classified: Secondary | ICD-10-CM | POA: Diagnosis not present

## 2017-08-25 DIAGNOSIS — M25562 Pain in left knee: Secondary | ICD-10-CM

## 2017-08-25 DIAGNOSIS — R29898 Other symptoms and signs involving the musculoskeletal system: Secondary | ICD-10-CM

## 2017-08-25 DIAGNOSIS — M6281 Muscle weakness (generalized): Secondary | ICD-10-CM | POA: Diagnosis not present

## 2017-08-25 DIAGNOSIS — G8929 Other chronic pain: Secondary | ICD-10-CM | POA: Diagnosis not present

## 2017-08-25 NOTE — Therapy (Signed)
[place] [facility] [address] [place], [place], [address] Phone: [phone]   Fax:  [phone]  Physical Therapy Treatment  Patient Details  Name: Laura [name] MRN: [id number] Date of Birth: [birth year] Referring Provider: Ollen Gross MD  Encounter Date: 08/25/2017      PT End of Session - 08/25/17 1027    Visit Number 3   Number of Visits 13   Date for PT Re-Evaluation 09/18/17   Authorization Type U[employer] Medicare   Authorization Time Period 08/18/2017 -09/30/2017   Authorization - Visit Number 3   Authorization - Number of Visits 10   PT Start Time 0950  Pt late   PT Stop Time 1030   PT Time Calculation (min) 40 min   Equipment Utilized During Treatment Other (comment)  L knee brace Doff for session   Activity Tolerance Patient tolerated treatment well   Behavior During Therapy [facility] for tasks assessed/performed      Past Medical History:  Diagnosis Date  . Anxiety   . Cystitis   . Depression   . Gastritis   . GERD (gastroesophageal reflux disease)   . Hashimoto's thyroiditis    Hx   . Hyperlipidemia   . Hypertension   . Hypothyroidism   . Normocytic anemia 2009 Hb 9.9-11.1  . Osteopenia   . Shoulder pain   . Urinary incontinence     Past Surgical History:  Procedure Laterality Date  . Bilateral tubal ligation    . BREAST EXCISIONAL BIOPSY  2010   Excisional biopsy of benign left breast mass -lopoma   . BREAST SURGERY     left nreast biopsy  . COLONOSCOPY  08/26/2011   SLF: 1. Internal hemorrhoids  . COLONOSCOPY N/A 10/06/2015   GMW:NUUV fissure or internal hemorrhoids/mild sigmoid colitis. benign colonic path   . colonscopy  2005   Dr. Katrinka Blazing  . ESOPHAGOGASTRODUODENOSCOPY  12/2009   chronic gastritis  . ESOPHAGOGASTRODUODENOSCOPY N/A 05/17/2017   Procedure: ESOPHAGOGASTRODUODENOSCOPY (EGD);  Surgeon: West Bali, MD;  [facility];  Service: Endoscopy;  Laterality: N/A;  830   . GIVENS CAPSULE  STUDY N/A 05/30/2017   Procedure: GIVENS CAPSULE STUDY;  Surgeon: West Bali, MD;  [facility];  Service: Endoscopy;  Laterality: N/A;  700  . left knee surgery Left March 1,2017  . PARTIAL KNEE ARTHROPLASTY Left 08/09/2017   Procedure: LEFT UNICOMPARTMENTAL KNEE;  Surgeon: Ollen Gross, MD;  Location: [facility];  Service: Orthopedics;  Laterality: Left;  . Resection of left lobe of thyroid    . right carpal tunnel release    . rt. knee athroscopy  2004  . SAVORY DILATION N/A 05/17/2017   Procedure: SAVORY DILATION;  Surgeon: West Bali, MD;  [facility];  Service: Endoscopy;  Laterality: N/A;  . TOTAL ABDOMINAL HYSTERECTOMY  1994  . UMBILICAL HERNIA REPAIR    . Urethral dilation for stenosis  2009    There were no vitals filed for this visit.      Subjective Assessment - 08/25/17 0955    Subjective Pt notes that she has some ache today but contributes to the rain.    Currently in Pain? Yes   Pain Score 5    Pain Location Knee                         [facility] Adult PT Treatment/Exercise - 08/25/17 0001      Knee/Hip Exercises: Stretches  Active Hamstring Stretch 10 seconds;Left   Active Hamstring Stretch Limitations 10 reps, 12" step   Knee: Self-Stretch to increase Flexion Left;10 seconds   Knee: Self-Stretch Limitations 10 reps onto 12" step     Knee/Hip Exercises: Seated   Long Arc Quad Left;10 reps     Knee/Hip Exercises: Supine   Quad Sets 10 reps;AROM;1 set   Short Arc Quad Sets 10 reps   Heel Slides 10 reps;1 set   Knee Extension Limitations 5 deg   foot prop half roll, over pressure   Knee Flexion Limitations 90 deg      Knee/Hip Exercises: Prone   Prone Knee Hang 5 minutes   Prone Knee Hang Limitations with manual to posterior knee and hamstring     Manual Therapy   Manual Therapy Soft tissue mobilization;Passive ROM;Edema management   Manual therapy comments performed seperately from all other interventions    Edema Management To Lt. knee light pressure for pain management and swelling   Soft tissue mobilization Light posterior knee for pain   Passive ROM Knee flexion and extension supine                PT Education - 08/25/17 1026    Education provided Yes   Education Details Demonstrated self posterior knee massage seated to work on at home per pt request. Demonstrated scar mobilization superior to inferior in small increments and to avoid side to side and pulling apart. Instructed on light pressure.    Person(s) Educated Patient   Methods Explanation;Demonstration;Tactile cues   Comprehension Returned demonstration;Verbalized understanding          PT Short Term Goals - 08/18/17 1053      PT SHORT TERM GOAL #1   Title Pt to be independent with HEP for Lt knee ROM and strengthening   Time 3   Period Weeks   Status New   Target Date 09/08/17     PT SHORT TERM GOAL #2   Title Pt to have 15-100 degress Lt knee mobility for functional gait.   Time 3   Period Weeks   Status New     PT SHORT TERM GOAL #3   Title Patient to have improved MMT grade by +1 at least to improve functional mobility and safety with LRAD.    Time 3   Period Weeks   Status New           PT Long Term Goals - 08/18/17 1056      PT LONG TERM GOAL #1   Title Pt to have 0-130 degrees of Lt. knee ROM to return to activities and exercise regimen prior to surgery.    Time 6   Period Weeks   Status New     PT LONG TERM GOAL #2   Title Patient will have improved TUG gat speed by at least 10 seconds with LRAD.    Time 6   Period Weeks   Status New     PT LONG TERM GOAL #3   Title Patient will have at least 4+/5 strength of Lt knee for functional daily tasks.      PT LONG TERM GOAL #4   Title Patient to have FOTO score improvement by at least 20% to indicate improvement in overall function daily with ADLs.      PT LONG TERM GOAL #5   Title Pt to ambulate up/down 4 steps with hand rail with  alternating steps.    Time 6   Period Weeks  Status New               Plan - 08/25/17 1028    Clinical Impression Statement Today's session focused primarily on knee mobility and quad strengthening. Patient presented with improved knee flexion by 10 degrees with significant tenderness inferior patella and instructed in self mobilization at home to help desentize for progression with ROM. Continues to make small gain with knee extension this session as well. Continue per POC.    Rehab Potential Good   PT Frequency 3x / week   PT Duration 2 weeks   PT Treatment/Interventions ADLs/Self Care Home Management;Cryotherapy;Electrical Stimulation;Gait training;Stair training;Functional mobility training;Therapeutic activities;Therapeutic exercise;Balance training;Neuromuscular re-education;Patient/family education;Manual techniques;Passive range of motion;Energy conservation   PT Next Visit Plan Continue to progress with primary focus on ROM at this point.  progress to functional strengtheing and continue manual as needed.   PT Home Exercise Plan Eval: glute set 2x15, quad set 3x10, heel slides 3x10   Consulted and Agree with Plan of Care Patient      Patient will benefit from skilled therapeutic intervention in order to improve the following deficits and impairments:  Abnormal gait, Decreased balance, Decreased endurance, Decreased range of motion, Decreased strength, Difficulty walking, Increased edema, Pain, Decreased mobility  Visit Diagnosis: Stiffness of left knee, not elsewhere classified  Acute pain of left knee  Muscle weakness (generalized)  Decreased strength involving knee joint     Problem List Patient Active Problem List   Diagnosis Date Noted  . OA (osteoarthritis) of knee 08/09/2017  . Anemia 07/07/2017  . Pre-op examination 06/23/2017  . Constipation 02/05/2016  . Knee pain, left 09/08/2015  . Allergic rhinitis 10/08/2014  . GAD (generalized anxiety disorder)  09/23/2014  . POSTSURGICAL HYPOTHYROIDISM 06/22/2010  . Normocytic anemia 08/04/2009  . GERD 04/20/2009  . Hyperlipidemia LDL goal <100 05/16/2008  . Essential hypertension 05/16/2008  . OSTEOPENIA 05/16/2008  . Urinary incontinence 05/16/2008   Candise Che PT, DPT 10:31 AM, 08/25/17 [phone]  [facility] [address] [place], [place], [address] Phone: [phone]   Fax:  [phone]  Name: Laura [name] MRN: [id number] Date of Birth: Mar 10, 1951

## 2017-08-28 ENCOUNTER — Ambulatory Visit (HOSPITAL_COMMUNITY): Payer: Medicare Other | Admitting: Physical Therapy

## 2017-08-28 ENCOUNTER — Encounter (HOSPITAL_COMMUNITY): Payer: Self-pay | Admitting: Physical Therapy

## 2017-08-28 DIAGNOSIS — M25662 Stiffness of left knee, not elsewhere classified: Secondary | ICD-10-CM

## 2017-08-28 DIAGNOSIS — R29898 Other symptoms and signs involving the musculoskeletal system: Secondary | ICD-10-CM

## 2017-08-28 DIAGNOSIS — M6281 Muscle weakness (generalized): Secondary | ICD-10-CM | POA: Diagnosis not present

## 2017-08-28 DIAGNOSIS — G8929 Other chronic pain: Secondary | ICD-10-CM | POA: Diagnosis not present

## 2017-08-28 DIAGNOSIS — M25562 Pain in left knee: Secondary | ICD-10-CM

## 2017-08-28 NOTE — Therapy (Signed)
[place] [facility] [address] [place], [place], [address] Phone: [phone]   Fax:  [phone]  Physical Therapy Treatment  Patient Details  Name: SARAMARIE STINGER MRN: [id number] Date of Birth: 09/14/1951 Referring Provider: Ollen Gross MD  Encounter Date: 08/28/2017      PT End of Session - 08/28/17 1030    Visit Number 4   Number of Visits 13   Date for PT Re-Evaluation 09/18/17   Authorization Type UHC Medicare   Authorization Time Period 08/18/2017 -09/30/2017   Authorization - Visit Number 4   Authorization - Number of Visits 10   PT Start Time 0947   PT Stop Time 1029   PT Time Calculation (min) 42 min   Activity Tolerance Patient tolerated treatment well   Behavior During Therapy Anxious;WFL for tasks assessed/performed      Past Medical History:  Diagnosis Date  . Anxiety   . Cystitis   . Depression   . Gastritis   . GERD (gastroesophageal reflux disease)   . Hashimoto's thyroiditis    Hx   . Hyperlipidemia   . Hypertension   . Hypothyroidism   . Normocytic anemia 2009 Hb 9.9-11.1  . Osteopenia   . Shoulder pain   . Urinary incontinence     Past Surgical History:  Procedure Laterality Date  . Bilateral tubal ligation    . BREAST EXCISIONAL BIOPSY  2010   Excisional biopsy of benign left breast mass -lopoma   . BREAST SURGERY     left nreast biopsy  . COLONOSCOPY  08/26/2011   SLF: 1. Internal hemorrhoids  . COLONOSCOPY N/A 10/06/2015   [facility]:EXBM fissure or internal hemorrhoids/mild sigmoid colitis. benign colonic path   . colonscopy  2005   Dr. Katrinka Blazing  . ESOPHAGOGASTRODUODENOSCOPY  12/2009   chronic gastritis  . ESOPHAGOGASTRODUODENOSCOPY N/A 05/17/2017   Procedure: ESOPHAGOGASTRODUODENOSCOPY (EGD);  Surgeon: West Bali, MD;  [facility];  Service: Endoscopy;  Laterality: N/A;  830   . GIVENS CAPSULE STUDY N/A 05/30/2017   Procedure: GIVENS CAPSULE STUDY;  Surgeon: West Bali, MD;   [facility];  Service: Endoscopy;  Laterality: N/A;  700  . left knee surgery Left March 1,2017  . PARTIAL KNEE ARTHROPLASTY Left 08/09/2017   Procedure: LEFT UNICOMPARTMENTAL KNEE;  Surgeon: Ollen Gross, MD;  [facility];  Service: Orthopedics;  Laterality: Left;  . Resection of left lobe of thyroid    . right carpal tunnel release    . rt. knee athroscopy  2004  . SAVORY DILATION N/A 05/17/2017   Procedure: SAVORY DILATION;  Surgeon: West Bali, MD;  [facility];  Service: Endoscopy;  Laterality: N/A;  . TOTAL ABDOMINAL HYSTERECTOMY  1994  . UMBILICAL HERNIA REPAIR    . Urethral dilation for stenosis  2009    There were no vitals filed for this visit.      Subjective Assessment - 08/28/17 0948    Subjective Patient arrives stating she continues to have some sharp, achey pains, like a papercut shooting up her medial knee over the past couple of days. No falls or close calls recently. Everything PT has done has been helping. Bending her knee is hard.    Pertinent History [facility] on the 08-09-17, Hx of Lt. meniscus repair    Patient Stated Goals To improve pain and get motion and strength back   Currently in Pain? Yes   Pain Score 3    Pain Location  Knee   Pain Orientation Left   Pain Descriptors / Indicators Aching;Shooting;Tightness   Pain Type Surgical pain   Pain Radiating Towards radiates up medial knee    Pain Onset 1 to 4 weeks ago   Pain Frequency Constant   Aggravating Factors  trying to do her motion exercises especially flexion    Pain Relieving Factors getting up and walking around    Effect of Pain on Daily Activities moderate-severe             [facility] PT Assessment - 08/28/17 0001      AROM   Left Knee Flexion 103                     [facility] Adult PT Treatment/Exercise - 08/28/17 0001      Knee/Hip Exercises: Stretches   Active Hamstring Stretch 10 seconds  10 second first 5, 15 seconds second 5    Active  Hamstring Stretch Limitations 10 reps, 12 inch step   due to poor patient tolerance      Knee/Hip Exercises: Supine   Quad Sets Left;1 set;15 reps   Quad Sets Limitations 5 second holds    Heel Slides Left;1 set;15 reps   Heel Slides Limitations 5 second holds      Manual Therapy   Manual Therapy Edema management;Joint mobilization;Soft tissue mobilization   Manual therapy comments performed seperately from all other interventions   Edema Management retrogarde masssage L LE elevated    Joint Mobilization patella mobility all directions as tolerated    Soft tissue mobilization L quad, scar mobility                 PT Education - 08/28/17 1030    Education provided Yes   Education Details scar massage and desensitization techniques   Person(s) Educated Patient   Methods Explanation   Comprehension Verbalized understanding;Need further instruction          PT Short Term Goals - 08/18/17 1053      PT SHORT TERM GOAL #1   Title Pt to be independent with HEP for Lt knee ROM and strengthening   Time 3   Period Weeks   Status New   Target Date 09/08/17     PT SHORT TERM GOAL #2   Title Pt to have 15-100 degress Lt knee mobility for functional gait.   Time 3   Period Weeks   Status New     PT SHORT TERM GOAL #3   Title Patient to have improved MMT grade by +1 at least to improve functional mobility and safety with LRAD.    Time 3   Period Weeks   Status New           PT Long Term Goals - 08/18/17 1056      PT LONG TERM GOAL #1   Title Pt to have 0-130 degrees of Lt. knee ROM to return to activities and exercise regimen prior to surgery.    Time 6   Period Weeks   Status New     PT LONG TERM GOAL #2   Title Patient will have improved TUG gat speed by at least 10 seconds with LRAD.    Time 6   Period Weeks   Status New     PT LONG TERM GOAL #3   Title Patient will have at least 4+/5 strength of Lt knee for functional daily tasks.      PT LONG TERM  GOAL #4  Title Patient to have FOTO score improvement by at least 20% to indicate improvement in overall function daily with ADLs.      PT LONG TERM GOAL #5   Title Pt to ambulate up/down 4 steps with hand rail with alternating steps.    Time 6   Period Weeks   Status New               Plan - 08/28/17 1030    Clinical Impression Statement Began session with manual interventions to reduce pain and edema, also to improve ROM; educated regarding scar massage as well as desensitization techniques as knee is quite tender as a whole right now. Otherwise performed exercises and activities oriented towards ROM this session with cues for form provided PRN. Noted considerable adhesions and buildup of scar from incision especially at proximal and distal ends as well as general tenderness of knee; assigned scar massage and desensitization techniques accordingly. Strong pain avoidance behaviors noted. Patient given frequent encouragement throughout session.    Rehab Potential Good   PT Frequency 3x / week   PT Duration 2 weeks   PT Treatment/Interventions ADLs/Self Care Home Management;Cryotherapy;Electrical Stimulation;Gait training;Stair training;Functional mobility training;Therapeutic activities;Therapeutic exercise;Balance training;Neuromuscular re-education;Patient/family education;Manual techniques;Passive range of motion;Energy conservation   PT Next Visit Plan Continue to progress with primary focus on ROM at this point.  progress to functional strengtheing and continue manual as needed.   PT Home Exercise Plan Eval: glute set 2x15, quad set 3x10, heel slides 3x10   Consulted and Agree with Plan of Care Patient      Patient will benefit from skilled therapeutic intervention in order to improve the following deficits and impairments:  Abnormal gait, Decreased balance, Decreased endurance, Decreased range of motion, Decreased strength, Difficulty walking, Increased edema, Pain, Decreased  mobility  Visit Diagnosis: Stiffness of left knee, not elsewhere classified  Acute pain of left knee  Muscle weakness (generalized)  Decreased strength involving knee joint     Problem List Patient Active Problem List   Diagnosis Date Noted  . OA (osteoarthritis) of knee 08/09/2017  . Anemia 07/07/2017  . Pre-op examination 06/23/2017  . Constipation 02/05/2016  . Knee pain, left 09/08/2015  . Allergic rhinitis 10/08/2014  . GAD (generalized anxiety disorder) 09/23/2014  . POSTSURGICAL HYPOTHYROIDISM 06/22/2010  . Normocytic anemia 08/04/2009  . GERD 04/20/2009  . Hyperlipidemia LDL goal <100 05/16/2008  . Essential hypertension 05/16/2008  . OSTEOPENIA 05/16/2008  . Urinary incontinence 05/16/2008    Nedra HaiKristen Edythe Riches PT, DPT [phone]  [facility] [address] [place], [place], [address] Phone: [phone]   Fax:  [phone]  Name: Skip EstimableJudy C Sweitzer MRN: [id number] Date of Birth: Apr 20, 1951

## 2017-08-28 NOTE — Patient Instructions (Signed)
SCAR MASSAGE   Starting from the bottom and working your way up, and using as much pressure as you can tolerate, rub your scar in clockwise and counter clockwise directions.   Work your way up to the top of your scar.  This may be very tender at first, but should improve with time.  Perform for 2-5 minutes, 2-3 times per day.    DESENSITIZATION TECHNIQUES  Using a dry pillowcase or washcloth, gently rub your knee as if you were bathing in the shower (but do not use water during this exercise).  This may be tender at first, but should improve with time.  Perform for 2-5 minutes, 2-3 times per day.

## 2017-08-30 ENCOUNTER — Ambulatory Visit (HOSPITAL_COMMUNITY): Payer: Medicare Other | Admitting: Physical Therapy

## 2017-08-30 DIAGNOSIS — M6281 Muscle weakness (generalized): Secondary | ICD-10-CM

## 2017-08-30 DIAGNOSIS — R29898 Other symptoms and signs involving the musculoskeletal system: Secondary | ICD-10-CM | POA: Diagnosis not present

## 2017-08-30 DIAGNOSIS — M25562 Pain in left knee: Secondary | ICD-10-CM

## 2017-08-30 DIAGNOSIS — G8929 Other chronic pain: Secondary | ICD-10-CM

## 2017-08-30 DIAGNOSIS — M25662 Stiffness of left knee, not elsewhere classified: Secondary | ICD-10-CM

## 2017-08-30 NOTE — Telephone Encounter (Signed)
Left vm to discuss community resources

## 2017-08-30 NOTE — Telephone Encounter (Signed)
Telephone outreach to patient to discuss community resources. Will mail financial resource information to patients mailing address, at patient's request.    Sherle PoeNicole Smokey Melott, Conrad BurlingtonB.A.  Care Guide  [phone]

## 2017-08-30 NOTE — Therapy (Addendum)
[facility] [address] [place], [place], [address] Phone: [phone]   Fax:  [phone]  Physical Therapy Treatment  Patient Details  Name: Laura [name] MRN: [id number] Date of Birth: 09/04/1951 Referring Provider: Ollen Gross MD  Encounter Date: 08/30/2017      PT End of Session - 08/30/17 1144    Visit Number 5   Number of Visits 13   Date for PT Re-Evaluation 09/18/17   Authorization Type UHC Medicare   Authorization Time Period 08/18/2017 -09/30/2017   Authorization - Visit Number 5   Authorization - Number of Visits 10   PT Start Time 1120   PT Stop Time 1200   PT Time Calculation (min) 40 min   Activity Tolerance Patient tolerated treatment well   Behavior During Therapy Anxious;WFL for tasks assessed/performed      Past Medical History:  Diagnosis Date  . Anxiety   . Cystitis   . Depression   . Gastritis   . GERD (gastroesophageal reflux disease)   . Hashimoto's thyroiditis    Hx   . Hyperlipidemia   . Hypertension   . Hypothyroidism   . Normocytic anemia 2009 Hb 9.9-11.1  . Osteopenia   . Shoulder pain   . Urinary incontinence     Past Surgical History:  Procedure Laterality Date  . Bilateral tubal ligation    . BREAST EXCISIONAL BIOPSY  2010   Excisional biopsy of benign left breast mass -lopoma   . BREAST SURGERY     left nreast biopsy  . COLONOSCOPY  08/26/2011   SLF: 1. Internal hemorrhoids  . COLONOSCOPY N/A 10/06/2015   ONG:EXBM fissure or internal hemorrhoids/mild sigmoid colitis. benign colonic path   . colonscopy  2005   Dr. Katrinka Blazing  . ESOPHAGOGASTRODUODENOSCOPY  12/2009   chronic gastritis  . ESOPHAGOGASTRODUODENOSCOPY N/A 05/17/2017   Procedure: ESOPHAGOGASTRODUODENOSCOPY (EGD);  Surgeon: West Bali, MD;  [facility];  Service: Endoscopy;  Laterality: N/A;  830   . GIVENS CAPSULE STUDY N/A 05/30/2017   Procedure: GIVENS CAPSULE STUDY;  Surgeon: West Bali, MD;   [facility];  Service: Endoscopy;  Laterality: N/A;  700  . left knee surgery Left March 1,2017  . PARTIAL KNEE ARTHROPLASTY Left 08/09/2017   Procedure: LEFT UNICOMPARTMENTAL KNEE;  Surgeon: Ollen Gross, MD;  Location: [facility];  Service: Orthopedics;  Laterality: Left;  . Resection of left lobe of thyroid    . right carpal tunnel release    . rt. knee athroscopy  2004  . SAVORY DILATION N/A 05/17/2017   Procedure: SAVORY DILATION;  Surgeon: West Bali, MD;  [facility];  Service: Endoscopy;  Laterality: N/A;  . TOTAL ABDOMINAL HYSTERECTOMY  1994  . UMBILICAL HERNIA REPAIR    . Urethral dilation for stenosis  2009    There were no vitals filed for this visit.      Subjective Assessment - 08/30/17 1121    Subjective PT  states that she is feeling pretty good; she is having no pain.   She finds herself dragging her leg at times    Pertinent History UKA on the 08-09-17, Hx of Lt. meniscus repair    Patient Stated Goals To improve pain and get motion and strength back   Currently in Pain? No/denies   Pain Onset 1 to 4 weeks ago  [facility] Adult PT Treatment/Exercise - 08/30/17 0001      Knee/Hip Exercises: Standing   Heel Raises 10 reps   Knee Flexion 10 reps   Forward Lunges 10 reps   Terminal Knee Extension Limitations 10   Functional Squat 10 reps   SLS B 60"      Knee/Hip Exercises: Seated   Sit to Sand 10 reps     Knee/Hip Exercises: Supine   Quad Sets Left;10 reps   Heel Slides 10 reps   Knee Extension PROM   Knee Extension Limitations AROM 5   Knee Flexion PROM   Knee Flexion Limitations 103     Manual Therapy   Manual Therapy Edema management;Joint mobilization;Soft tissue mobilization   Manual therapy comments performed seperately from all other interventions   Edema Management retrogarde masssage L LE elevated    Joint Mobilization patella mobility all directions as tolerated    Soft tissue  mobilization L quad, scar mobility                   PT Short Term Goals - 08/30/17 1200      PT SHORT TERM GOAL #1   Title Pt to be independent with HEP for Lt knee ROM and strengthening   Time 3   Period Weeks   Status Achieved     PT SHORT TERM GOAL #2   Title Pt to have 15-100 degress Lt knee mobility for functional gait.   Time 3   Period Weeks   Status Achieved     PT SHORT TERM GOAL #3   Title Patient to have improved MMT grade by +1 at least to improve functional mobility and safety with LRAD.    Time 3   Period Weeks   Status On-going           PT Long Term Goals - 08/18/17 1056      PT LONG TERM GOAL #1   Title Pt to have 0-130 degrees of Lt. knee ROM to return to activities and exercise regimen prior to surgery.    Time 6   Period Weeks   Status New     PT LONG TERM GOAL #2   Title Patient will have improved TUG gat speed by at least 10 seconds with LRAD.    Time 6   Period Weeks   Status New     PT LONG TERM GOAL #3   Title Patient will have at least 4+/5 strength of Lt knee for functional daily tasks.      PT LONG TERM GOAL #4   Title Patient to have FOTO score improvement by at least 20% to indicate improvement in overall function daily with ADLs.      PT LONG TERM GOAL #5   Title Pt to ambulate up/down 4 steps with hand rail with alternating steps.    Time 6   Period Weeks   Status New               Plan - 08/30/17 1144    Clinical Impression Statement Pt continues to gain in ROM although she has significant increased pain with end range and PROM.  Pt treatment advanced with functional standing activity that will improve ROM.     Rehab Potential Good   PT Frequency 3x / week   PT Duration 2 weeks   PT Treatment/Interventions ADLs/Self Care Home Management;Cryotherapy;Electrical Stimulation;Gait training;Stair training;Functional mobility training;Therapeutic activities;Therapeutic exercise;Balance training;Neuromuscular  re-education;Patient/family education;Manual techniques;Passive range of motion;Energy conservation  PT Next Visit Plan Continue to progress with primary focus on ROM at this point.  progress to functional strengtheing and continue manual as needed.   PT Home Exercise Plan Eval: glute set 2x15, quad set 3x10, heel slides 3x10   Consulted and Agree with Plan of Care Patient      Patient will benefit from skilled therapeutic intervention in order to improve the following deficits and impairments:  Abnormal gait, Decreased balance, Decreased endurance, Decreased range of motion, Decreased strength, Difficulty walking, Increased edema, Pain, Decreased mobility  Visit Diagnosis: Stiffness of left knee, not elsewhere classified  Muscle weakness (generalized)  Acute pain of left knee  Decreased strength involving knee joint  Chronic pain of left knee     Problem List Patient Active Problem List   Diagnosis Date Noted  . OA (osteoarthritis) of knee 08/09/2017  . Anemia 07/07/2017  . Pre-op examination 06/23/2017  . Constipation 02/05/2016  . Knee pain, left 09/08/2015  . Allergic rhinitis 10/08/2014  . GAD (generalized anxiety disorder) 09/23/2014  . POSTSURGICAL HYPOTHYROIDISM 06/22/2010  . Normocytic anemia 08/04/2009  . GERD 04/20/2009  . Hyperlipidemia LDL goal <100 05/16/2008  . Essential hypertension 05/16/2008  . OSTEOPENIA 05/16/2008  . Urinary incontinence 05/16/2008   Virgina Organ, PT CLT [phone] 08/30/2017, 12:00 PM  [facility] [address] [place], [place], [address] Phone: [phone]   Fax:  [phone]  Name: Laura [name] MRN: [id number] Date of Birth: 10/15/1951

## 2017-09-01 ENCOUNTER — Encounter (HOSPITAL_COMMUNITY): Payer: Self-pay

## 2017-09-01 ENCOUNTER — Ambulatory Visit (HOSPITAL_COMMUNITY): Payer: Medicare Other | Attending: Orthopedic Surgery

## 2017-09-01 DIAGNOSIS — G8929 Other chronic pain: Secondary | ICD-10-CM

## 2017-09-01 DIAGNOSIS — M25662 Stiffness of left knee, not elsewhere classified: Secondary | ICD-10-CM | POA: Diagnosis not present

## 2017-09-01 DIAGNOSIS — M6281 Muscle weakness (generalized): Secondary | ICD-10-CM | POA: Diagnosis not present

## 2017-09-01 DIAGNOSIS — R29898 Other symptoms and signs involving the musculoskeletal system: Secondary | ICD-10-CM

## 2017-09-01 DIAGNOSIS — M25562 Pain in left knee: Secondary | ICD-10-CM | POA: Diagnosis not present

## 2017-09-01 NOTE — Patient Instructions (Signed)
  SEATED HAMSTRING STRETCH  While seated, rest your heel on the floor with your knee straight and gently lean forward until a stretch is felt behind your knee/thigh.       Start by standing in front of a wall or other sturdy object. Step forward with one foot and maintain your toes on both feet to be pointed straight forward. A. Keep the leg behind you with a bent knee during the stretch. B. Keep the leg behind you with a straight knee during the stretch.   Lean forward towards the wall and support yourself with your arms as you allow your front knee to bend until a gentle stretch is felt along the back of your leg that is most behind you.   Move closer or further away from the wall to control the stretch of the back leg. Also you can adjust the bend of the front knee to control the stretch as well.

## 2017-09-01 NOTE — Therapy (Signed)
[place] [address] [place], [place], [address] Phone: [phone]   Fax:  [phone]  Physical Therapy Treatment  Patient Details  Name: Laura [name] MRN: [id number] Date of Birth: 07-25-1951 Referring Provider: Ollen Gross MD  Encounter Date: 09/01/2017      PT End of Session - 09/01/17 1120    Visit Number 6   Number of Visits 13   Date for PT Re-Evaluation 09/18/17   Authorization Type UHC Medicare   Authorization Time Period 08/18/2017 -09/30/2017   Authorization - Visit Number 6   Authorization - Number of Visits 10   PT Start Time 0945   PT Stop Time 1029   PT Time Calculation (min) 44 min   Activity Tolerance Patient tolerated treatment well   Behavior During Therapy Anxious;WFL for tasks assessed/performed      Past Medical History:  Diagnosis Date  . Anxiety   . Cystitis   . Depression   . Gastritis   . GERD (gastroesophageal reflux disease)   . Hashimoto's thyroiditis    Hx   . Hyperlipidemia   . Hypertension   . Hypothyroidism   . Normocytic anemia 2009 Hb 9.9-11.1  . Osteopenia   . Shoulder pain   . Urinary incontinence     Past Surgical History:  Procedure Laterality Date  . Bilateral tubal ligation    . BREAST EXCISIONAL BIOPSY  2010   Excisional biopsy of benign left breast mass -lopoma   . BREAST SURGERY     left nreast biopsy  . COLONOSCOPY  08/26/2011   SLF: 1. Internal hemorrhoids  . COLONOSCOPY N/A 10/06/2015   DGU:YQIH fissure or internal hemorrhoids/mild sigmoid colitis. benign colonic path   . colonscopy  2005   Dr. Katrinka Blazing  . ESOPHAGOGASTRODUODENOSCOPY  12/2009   chronic gastritis  . ESOPHAGOGASTRODUODENOSCOPY N/A 05/17/2017   Procedure: ESOPHAGOGASTRODUODENOSCOPY (EGD);  Surgeon: West Bali, MD;  [facility];  Service: Endoscopy;  Laterality: N/A;  830   . GIVENS CAPSULE STUDY N/A 05/30/2017   Procedure: GIVENS CAPSULE STUDY;  Surgeon: West Bali, MD;   [facility];  Service: Endoscopy;  Laterality: N/A;  700  . left knee surgery Left March 1,2017  . PARTIAL KNEE ARTHROPLASTY Left 08/09/2017   Procedure: LEFT UNICOMPARTMENTAL KNEE;  Surgeon: Ollen Gross, MD;  Location: [facility];  Service: Orthopedics;  Laterality: Left;  . Resection of left lobe of thyroid    . right carpal tunnel release    . rt. knee athroscopy  2004  . SAVORY DILATION N/A 05/17/2017   Procedure: SAVORY DILATION;  Surgeon: West Bali, MD;  [facility];  Service: Endoscopy;  Laterality: N/A;  . TOTAL ABDOMINAL HYSTERECTOMY  1994  . UMBILICAL HERNIA REPAIR    . Urethral dilation for stenosis  2009    There were no vitals filed for this visit.      Subjective Assessment - 09/01/17 0948    Subjective Patient states she is feeling good today and reports she was able to go up and down stairs yesterday using a reciprocal gait pattern using hand rail without falling or increased pain.    Currently in Pain? No/denies                         [facility] Adult PT Treatment/Exercise - 09/01/17 0001      Knee/Hip Exercises: Stretches   Active Hamstring Stretch Limitations 15 sec, 3 reps  step   Knee: Self-Stretch to increase Flexion Left;10 seconds   Knee: Self-Stretch Limitations 10 reps onto 12" step     Knee/Hip Exercises: Aerobic   Stationary Bike Seat 12 3 minutes beginning     Knee/Hip Exercises: Standing   Heel Raises 15 reps   Knee Flexion 15 reps   Forward Lunges 15 reps   Forward Lunges Limitations 4" box   Forward Step Up 10 reps;Step Height: 4";Step Height: 6"   Forward Step Up Limitations each   Functional Squat 10 reps   Functional Squat Limitations cue with table no UE assist     Knee/Hip Exercises: Supine   Knee Extension Limitations 5   Knee Flexion PROM   Knee Flexion Limitations 115     Manual Therapy   Manual Therapy Edema management;Joint mobilization;Soft tissue mobilization   Manual therapy  comments performed seperately from all other interventions   Soft tissue mobilization L quad, scar mobility; posterior medial                   PT Short Term Goals - 08/30/17 1200      PT SHORT TERM GOAL #1   Title Pt to be independent with HEP for Lt knee ROM and strengthening   Time 3   Period Weeks   Status Achieved     PT SHORT TERM GOAL #2   Title Pt to have 15-100 degress Lt knee mobility for functional gait.   Time 3   Period Weeks   Status Achieved     PT SHORT TERM GOAL #3   Title Patient to have improved MMT grade by +1 at least to improve functional mobility and safety with LRAD.    Time 3   Period Weeks   Status On-going           PT Long Term Goals - 08/30/17 1200      PT LONG TERM GOAL #1   Title Pt to have 0-130 degrees of Lt. knee ROM to return to activities and exercise regimen prior to surgery.    Time 6   Period Weeks   Status On-going     PT LONG TERM GOAL #2   Title Patient will have improved TUG gat speed by at least 10 seconds with LRAD.    Time 6   Period Weeks   Status On-going     PT LONG TERM GOAL #3   Title Patient will have at least 4+/5 strength of Lt knee for functional daily tasks.    Status On-going     PT LONG TERM GOAL #4   Title Patient to have FOTO score improvement by at least 20% to indicate improvement in overall function daily with ADLs.    Status On-going     PT LONG TERM GOAL #5   Title Pt to ambulate up/down 4 steps with hand rail with alternating steps.    Time 6   Period Weeks   Status On-going               Plan - 09/01/17 1120    Clinical Impression Statement Began session with bike for warm up patient doing full revolution end of 4 minutes. She tolerated addition of functional step strengthening for standard height with slight fatigue noted. She has made good improvement with knee flexion ROM, however, extension remains the same. Patient is tolerated session well. Plan to focus more on  ambulation plan next session.    Rehab Potential Good  PT Frequency 3x / week   PT Duration 2 weeks   PT Treatment/Interventions ADLs/Self Care Home Management;Cryotherapy;Electrical Stimulation;Gait training;Stair training;Functional mobility training;Therapeutic activities;Therapeutic exercise;Balance training;Neuromuscular re-education;Patient/family education;Manual techniques;Passive range of motion;Energy conservation   PT Next Visit Plan Continue to progress with primary focus on ROM at this point.  progress to functional strengtheing and continue manual as needed.   PT Home Exercise Plan Eval: glute set 2x15, quad set 3x10, heel slides 3x10   Consulted and Agree with Plan of Care Patient      Patient will benefit from skilled therapeutic intervention in order to improve the following deficits and impairments:  Abnormal gait, Decreased balance, Decreased endurance, Decreased range of motion, Decreased strength, Difficulty walking, Increased edema, Pain, Decreased mobility  Visit Diagnosis: Stiffness of left knee, not elsewhere classified  Muscle weakness (generalized)  Acute pain of left knee  Decreased strength involving knee joint  Chronic pain of left knee     Problem List Patient Active Problem List   Diagnosis Date Noted  . OA (osteoarthritis) of knee 08/09/2017  . Anemia 07/07/2017  . Pre-op examination 06/23/2017  . Constipation 02/05/2016  . Knee pain, left 09/08/2015  . Allergic rhinitis 10/08/2014  . GAD (generalized anxiety disorder) 09/23/2014  . POSTSURGICAL HYPOTHYROIDISM 06/22/2010  . Normocytic anemia 08/04/2009  . GERD 04/20/2009  . Hyperlipidemia LDL goal <100 05/16/2008  . Essential hypertension 05/16/2008  . OSTEOPENIA 05/16/2008  . Urinary incontinence 05/16/2008   Candise CheSiona Les Longmore PT, DPT 11:27 AM, 09/01/17 [phone]  [facility] [address] [place], [place], [address] Phone: [phone]   Fax:   [phone]  Name: Skip EstimableJudy C Bame MRN: [id number] Date of Birth: [birth year]

## 2017-09-04 ENCOUNTER — Encounter (HOSPITAL_COMMUNITY): Payer: Self-pay | Admitting: Physical Therapy

## 2017-09-04 ENCOUNTER — Other Ambulatory Visit: Payer: Self-pay | Admitting: "Endocrinology

## 2017-09-04 ENCOUNTER — Ambulatory Visit (HOSPITAL_COMMUNITY): Payer: Medicare Other | Admitting: Physical Therapy

## 2017-09-04 DIAGNOSIS — E1165 Type 2 diabetes mellitus with hyperglycemia: Secondary | ICD-10-CM

## 2017-09-04 DIAGNOSIS — E890 Postprocedural hypothyroidism: Secondary | ICD-10-CM | POA: Diagnosis not present

## 2017-09-04 DIAGNOSIS — G8929 Other chronic pain: Secondary | ICD-10-CM | POA: Diagnosis not present

## 2017-09-04 DIAGNOSIS — M6281 Muscle weakness (generalized): Secondary | ICD-10-CM | POA: Diagnosis not present

## 2017-09-04 DIAGNOSIS — R29898 Other symptoms and signs involving the musculoskeletal system: Secondary | ICD-10-CM | POA: Diagnosis not present

## 2017-09-04 DIAGNOSIS — M25662 Stiffness of left knee, not elsewhere classified: Secondary | ICD-10-CM | POA: Diagnosis not present

## 2017-09-04 DIAGNOSIS — M25562 Pain in left knee: Secondary | ICD-10-CM | POA: Diagnosis not present

## 2017-09-04 DIAGNOSIS — E039 Hypothyroidism, unspecified: Secondary | ICD-10-CM

## 2017-09-04 NOTE — Therapy (Signed)
[place] [address] [place], [place], [address] Phone: [phone]   Fax:  [phone]  Physical Therapy Treatment  Patient Details  Name: Laura [name] MRN: [id number] Date of Birth: 05/30/1951 Referring Provider: Ollen Gross MD   Encounter Date: 09/04/2017  PT End of Session - 09/04/17 1117    Visit Number  7    Number of Visits  13    Date for PT Re-Evaluation  09/18/17    Authorization Type  UHC Medicare    Authorization Time Period  08/18/2017 -09/30/2017    Authorization - Visit Number  7    Authorization - Number of Visits  10    PT Start Time  1035    PT Stop Time  1113    PT Time Calculation (min)  38 min    Activity Tolerance  Patient tolerated treatment well    Behavior During Therapy  [facility] for tasks assessed/performed       Past Medical History:  Diagnosis Date  . Anxiety   . Cystitis   . Depression   . Gastritis   . GERD (gastroesophageal reflux disease)   . Hashimoto's thyroiditis    Hx   . Hyperlipidemia   . Hypertension   . Hypothyroidism   . Normocytic anemia 2009 Hb 9.9-11.1  . Osteopenia   . Shoulder pain   . Urinary incontinence     Past Surgical History:  Procedure Laterality Date  . Bilateral tubal ligation    . BREAST EXCISIONAL BIOPSY  2010   Excisional biopsy of benign left breast mass -lopoma   . BREAST SURGERY     left nreast biopsy  . colonscopy  2005   Dr. Katrinka Blazing  . ESOPHAGOGASTRODUODENOSCOPY  12/2009   chronic gastritis  . left knee surgery Left March 1,2017  . Resection of left lobe of thyroid    . right carpal tunnel release    . rt. knee athroscopy  2004  . TOTAL ABDOMINAL HYSTERECTOMY  1994  . UMBILICAL HERNIA REPAIR    . Urethral dilation for stenosis  2009    There were no vitals filed for this visit.  Subjective Assessment - 09/04/17 1037    Subjective  Patient she couldn't fit her tennis shhoes on her LLE this morning due to swelling. Patient states feeling  pretty good overall, "a little stiff in the joint" "the rain doesn't  agree with me"    Currently in Pain?  No/denies         [facility] PT Assessment - 09/04/17 0001      AROM   Left Knee Extension  13    Left Knee Flexion  103                  [facility] Adult PT Treatment/Exercise - 09/04/17 0001      Knee/Hip Exercises: Stretches   Active Hamstring Stretch Limitations  15 sec, 3 reps  box   box   Knee: Self-Stretch to increase Flexion  Left;10 seconds 10 reps   10 reps   Gastroc Stretch  Both;30 seconds;3 reps slant board   slant board     Manual Therapy   Manual Therapy  Edema management;Soft tissue mobilization;Joint mobilization    Manual therapy comments  performed seperately from all other interventions    Edema Management  retrogarde masssage L LE elevated     Joint Mobilization  AP tibiofemoral and patella mobility all directions as tolerated    Soft tissue mobilization  scar mobilization    Passive ROM  overpressure for knee flexion and extension supine cue to perform jendrassik maneuver/count to deter guarding   cue to perform jendrassik maneuver/count to deter guarding            PT Education - 09/04/17 1116    Education provided  Yes    Education Details  educated on stretching and scar mobilization    Person(s) Educated  Patient    Methods  Explanation    Comprehension  Verbalized understanding;Need further instruction       PT Short Term Goals - 08/30/17 1200      PT SHORT TERM GOAL #1   Title  Pt to be independent with HEP for Lt knee ROM and strengthening    Time  3    Period  Weeks    Status  Achieved      PT SHORT TERM GOAL #2   Title  Pt to have 15-100 degress Lt knee mobility for functional gait.    Time  3    Period  Weeks    Status  Achieved      PT SHORT TERM GOAL #3   Title  Patient to have improved MMT grade by +1 at least to improve functional mobility and safety with LRAD.     Time  3    Period  Weeks    Status   On-going        PT Long Term Goals - 08/30/17 1200      PT LONG TERM GOAL #1   Title  Pt to have 0-130 degrees of Lt. knee ROM to return to activities and exercise regimen prior to surgery.     Time  6    Period  Weeks    Status  On-going      PT LONG TERM GOAL #2   Title  Patient will have improved TUG gat speed by at least 10 seconds with LRAD.     Time  6    Period  Weeks    Status  On-going      PT LONG TERM GOAL #3   Title  Patient will have at least 4+/5 strength of Lt knee for functional daily tasks.     Status  On-going      PT LONG TERM GOAL #4   Title  Patient to have FOTO score improvement by at least 20% to indicate improvement in overall function daily with ADLs.     Status  On-going      PT LONG TERM GOAL #5   Title  Pt to ambulate up/down 4 steps with hand rail with alternating steps.     Time  6    Period  Weeks    Status  On-going            Plan - 09/04/17 1118    Clinical Impression Statement  Patient requires cuing to prevent Valsalva secondary to pain. Continued with manual for ROM and edema control, introducing AP tib-femoral mobilizations for flexion today as well. Tolerance to ROM exercises and activities seems to be improving although patient continues to be limited by fear avoidance patterns. Able to improve knee ROM to 13-103 degrees today.     Rehab Potential  Good    PT Frequency  3x / week    PT Duration  2 weeks    PT Treatment/Interventions  ADLs/Self Care Home Management;Cryotherapy;Electrical Stimulation;Gait training;Stair training;Functional mobility training;Therapeutic activities;Therapeutic exercise;Balance training;Neuromuscular re-education;Patient/family education;Manual techniques;Passive range of  motion;Energy conservation    PT Next Visit Plan  Continue to progress with primary focus on ROM at this point.  progress to functional strengtheing and continue manual as needed. jendrassik maneuver to deter muscle gaurding. Update  HEP, include CKC exercises.    PT Home Exercise Plan  Eval: glute set 2x15, quad set 3x10, heel slides 3x10    Consulted and Agree with Plan of Care  Patient       Patient will benefit from skilled therapeutic intervention in order to improve the following deficits and impairments:  Abnormal gait, Decreased balance, Decreased endurance, Decreased range of motion, Decreased strength, Difficulty walking, Increased edema, Pain, Decreased mobility  Visit Diagnosis: Stiffness of left knee, not elsewhere classified  Muscle weakness (generalized)  Decreased strength involving knee joint  Chronic pain of left knee     Problem List Patient Active Problem List   Diagnosis Date Noted  . OA (osteoarthritis) of knee 08/09/2017  . Anemia 07/07/2017  . Pre-op examination 06/23/2017  . Constipation 02/05/2016  . Knee pain, left 09/08/2015  . Allergic rhinitis 10/08/2014  . GAD (generalized anxiety disorder) 09/23/2014  . POSTSURGICAL HYPOTHYROIDISM 06/22/2010  . Normocytic anemia 08/04/2009  . GERD 04/20/2009  . Hyperlipidemia LDL goal <100 05/16/2008  . Essential hypertension 05/16/2008  . OSTEOPENIA 05/16/2008  . Urinary incontinence 05/16/2008    Nedra HaiKristen Unger PT, DPT [phone]  [facility] [address] [place], [place], [address] Phone: [phone]   Fax:  [phone]  Name: Skip EstimableJudy C Quadros MRN: [id number] Date of Birth: 1951-10-14

## 2017-09-05 LAB — COMPLETE METABOLIC PANEL WITH GFR
AG Ratio: 1.2 (calc) (ref 1.0–2.5)
ALBUMIN MSPROF: 4 g/dL (ref 3.6–5.1)
ALKALINE PHOSPHATASE (APISO): 76 U/L (ref 33–130)
ALT: 7 U/L (ref 6–29)
AST: 13 U/L (ref 10–35)
BILIRUBIN TOTAL: 0.7 mg/dL (ref 0.2–1.2)
BUN / CREAT RATIO: 9 (calc) (ref 6–22)
BUN: 9 mg/dL (ref 7–25)
CHLORIDE: 99 mmol/L (ref 98–110)
CO2: 32 mmol/L (ref 20–32)
CREATININE: 1.03 mg/dL — AB (ref 0.50–0.99)
Calcium: 10.1 mg/dL (ref 8.6–10.4)
GFR, Est African American: 65 mL/min/{1.73_m2} (ref >=60)
GFR, Est Non African American: 56 mL/min/{1.73_m2} — ABNORMAL LOW (ref >=60)
GLUCOSE: 89 mg/dL (ref 65–139)
Globulin: 3.3 g/dL (calc) (ref 1.9–3.7)
Potassium: 3.1 mmol/L — ABNORMAL LOW (ref 3.5–5.3)
Sodium: 138 mmol/L (ref 135–146)
Total Protein: 7.3 g/dL (ref 6.1–8.1)

## 2017-09-05 LAB — HEMOGLOBIN A1C
EAG (MMOL/L): 5.8 (calc)
Hgb A1c MFr Bld: 5.3 % of total Hgb (ref <5.7)
MEAN PLASMA GLUCOSE: 105 (calc)

## 2017-09-05 MED FILL — Bupivacaine Liposome Inj 1.3% (13.3 MG/ML): INTRAMUSCULAR | Qty: 20 | Status: AC

## 2017-09-06 ENCOUNTER — Encounter: Payer: Self-pay | Admitting: "Endocrinology

## 2017-09-06 ENCOUNTER — Ambulatory Visit (INDEPENDENT_AMBULATORY_CARE_PROVIDER_SITE_OTHER): Payer: Medicare Other | Admitting: "Endocrinology

## 2017-09-06 VITALS — BP 108/73 | HR 75 | Ht 64.0 in | Wt 166.0 lb

## 2017-09-06 DIAGNOSIS — E890 Postprocedural hypothyroidism: Secondary | ICD-10-CM

## 2017-09-06 LAB — TSH: TSH: 0.38 m[IU]/L — AB (ref 0.40–4.50)

## 2017-09-06 LAB — T4, FREE: FREE T4: 1.7 ng/dL (ref 0.8–1.8)

## 2017-09-06 NOTE — Progress Notes (Signed)
HPI  Laura EstimableJudy [name] [name] is a 66 y.o.-year-old female,  She has  medical h/o Goiter status post left sided hemithyroidectomy 20+ years ago,  unremarkable thyroid u/s in 2012.   She remained on levothyroxine 112g.  She is compliant taking her medication properly.  She has lost weight since last visit, still complains of fatigue,  cold intolerance.  Pt denies feeling nodules in neck, hoarseness, dysphagia/odynophagia, SOB with lying down.  No h/o radiation tx to head or neck. No recent use of iodine supplements.   ROS: Constitutional: + weight loss, no fatigue, no subjective hyperthermia. She [name]/o cold hands . Eyes: no blurry vision, no xerophthalmia ENT: no sore throat, no nodules palpated in throat, no dysphagia/odynophagia, no hoarseness Cardiovascular: no CP/SOB/palpitations/leg swelling Respiratory: no cough/SOB Gastrointestinal: no N/V/D/[name] Musculoskeletal: no muscle/joint aches Skin: no rashes Neurological: no tremors/numbness/tingling/dizziness Psychiatric: no depression/anxiety  PE: BP 108/73   Pulse 75   Ht 5\' 4"  (1.626 m)   Wt 166 lb (75.3 kg)   BMI 28.49 kg/m  Wt Readings from Last 3 Encounters:  09/06/17 166 lb (75.3 kg)  08/09/17 176 lb (79.8 kg)  08/03/17 176 lb 4 oz (79.9 kg)   Constitutional: overweight, in NAD Eyes: PERRLA, EOMI, no exophthalmos ENT: moist mucous membranes, no thyromegaly, no cervical lymphadenopathy Cardiovascular: RRR, No MRG Respiratory: CTA B Gastrointestinal: abdomen soft, NT, ND, BS+ Musculoskeletal: no deformities, strength intact in all 4 Skin: moist, warm, no rashes Neurological: no tremor with outstretched hands, DTR normal in all 4   Results for Laura [name], Laura [name] (MRN [id number]) as of 09/06/2017 13:09  Ref. Range 02/24/2017 09:20 06/21/2017 11:13  TSH Latest Ref Range: 0.450 - 4.500 uIU/mL 0.12 (L) 0.045 (L)  T4,Free(Direct) Latest Ref Range: 0.8 - 1.8 ng/dL 1.7      ASSESSMENT: 1. Hypothyroidism, postsurgical  PLAN:  - She  does not have recent thyroid function tests.  - I Advised her to remain on levothyroxine to 112 g by mouth every morning. - She'll be sent for new set of labs today, and will be called if medication adjustments as necessary.    - We discussed about correct intake of levothyroxine, at fasting, with water, separated by at least 30 minutes from breakfast, and separated by more than 4 hours from calcium, iron, multivitamins, acid reflux medications (PPIs). -Patient is made aware of the fact that thyroid hormone replacement is needed for life, dose to be adjusted by periodic monitoring of thyroid function tests. -Due to absence of clinical goiter, no need for thyroid ultrasound for now.  Her next follow-up with me 3 months from now with repeat thyroid function tests.  Marquis LunchGebre Nida, MD [facility] [address] [place], [place] [address] Ph: [phone] F: [phone]      09/06/2017, 1:08 PM

## 2017-09-08 ENCOUNTER — Ambulatory Visit (HOSPITAL_COMMUNITY): Payer: Medicare Other

## 2017-09-08 ENCOUNTER — Telehealth: Payer: Self-pay

## 2017-09-08 ENCOUNTER — Encounter (HOSPITAL_COMMUNITY): Payer: Self-pay

## 2017-09-08 DIAGNOSIS — R29898 Other symptoms and signs involving the musculoskeletal system: Secondary | ICD-10-CM

## 2017-09-08 DIAGNOSIS — M6281 Muscle weakness (generalized): Secondary | ICD-10-CM

## 2017-09-08 DIAGNOSIS — G8929 Other chronic pain: Secondary | ICD-10-CM | POA: Diagnosis not present

## 2017-09-08 DIAGNOSIS — M25562 Pain in left knee: Secondary | ICD-10-CM | POA: Diagnosis not present

## 2017-09-08 DIAGNOSIS — M25662 Stiffness of left knee, not elsewhere classified: Secondary | ICD-10-CM

## 2017-09-08 NOTE — Telephone Encounter (Signed)
Left message for pt to call back  °

## 2017-09-08 NOTE — Telephone Encounter (Signed)
-----   Message from Roma KayserGebreselassie W Nida, MD sent at 09/08/2017  9:50 AM EST ----- Selena BattenKim, Would you advise patient to continue her current dose of thyroid hormone at 112 g ? Thank you.

## 2017-09-08 NOTE — Therapy (Signed)
[facility] [address] [place], [place], [address] Phone: [phone]   Fax:  [phone]  Physical Therapy Treatment  Patient Details  Name: Laura EstimableJudy C [name] MRN: [id number] Date of Birth: Mar 31, 1951 Referring Provider: Ollen GrossAluisio, Frank MD   Encounter Date: 09/08/2017  PT End of Session - 09/08/17 1117    Visit Number  8    Number of Visits  13    Date for PT Re-Evaluation  09/18/17    Authorization Type  UHC Medicare    Authorization Time Period  08/18/2017 -09/30/2017    Authorization - Visit Number  8    Authorization - Number of Visits  10    PT Start Time  1030    PT Stop Time  1115    PT Time Calculation (min)  45 min    Activity Tolerance  Patient tolerated treatment well    Behavior During Therapy  [facility] for tasks assessed/performed       Past Medical History:  Diagnosis Date  . Anxiety   . Cystitis   . Depression   . Gastritis   . GERD (gastroesophageal reflux disease)   . Hashimoto's thyroiditis    Hx   . Hyperlipidemia   . Hypertension   . Hypothyroidism   . Normocytic anemia 2009 Hb 9.9-11.1  . Osteopenia   . Shoulder pain   . Urinary incontinence     Past Surgical History:  Procedure Laterality Date  . Bilateral tubal ligation    . BREAST EXCISIONAL BIOPSY  2010   Excisional biopsy of benign left breast mass -lopoma   . BREAST SURGERY     left nreast biopsy  . colonscopy  2005   Dr. Katrinka BlazingSmith  . ESOPHAGOGASTRODUODENOSCOPY  12/2009   chronic gastritis  . left knee surgery Left March 1,2017  . Resection of left lobe of thyroid    . right carpal tunnel release    . rt. knee athroscopy  2004  . TOTAL ABDOMINAL HYSTERECTOMY  1994  . UMBILICAL HERNIA REPAIR    . Urethral dilation for stenosis  2009    There were no vitals filed for this visit.  Subjective Assessment - 09/08/17 1034    Subjective  Patient notes it feels tight and has some stinging medial left knee. She notes that she has some tenderness in  scar superior and it feels "raw".     Currently in Pain?  No/denies                      [facility] Treatment/Exercise - 09/08/17 0001      Knee/Hip Exercises: Seated   Long Arc Quad  15 reps 5s hold    Sit to Starbucks CorporationSand  10 reps trial 2 inch step next sesion      Knee/Hip Exercises: Supine   Bridges  10 reps    Knee Extension Limitations  5    Knee Flexion  PROM    Knee Flexion Limitations  115      Knee/Hip Exercises: Prone   Prone Knee Hang  3 minutes    Prone Knee Hang Limitations  with manual to posterior knee and hamstring      Manual Therapy   Manual Therapy  Edema management;Soft tissue mobilization;Joint mobilization    Manual therapy comments  performed seperately from all other interventions    Soft tissue mobilization  Scar mobilization in max tolerated knee flexion ROM to superior 4 inch of scar where pt  notes pain    Passive ROM  overpressure for knee flexion and extension pron      Prosthetics   Prosthetic Care Comments   e               PT Short Term Goals - 08/30/17 1200      PT SHORT TERM GOAL #1   Title  Pt to be independent with HEP for Lt knee ROM and strengthening    Time  3    Period  Weeks    Status  Achieved      PT SHORT TERM GOAL #2   Title  Pt to have 15-100 degress Lt knee mobility for functional gait.    Time  3    Period  Weeks    Status  Achieved      PT SHORT TERM GOAL #3   Title  Patient to have improved MMT grade by +1 at least to improve functional mobility and safety with LRAD.     Time  3    Period  Weeks    Status  On-going        PT Long Term Goals - 08/30/17 1200      PT LONG TERM GOAL #1   Title  Pt to have 0-130 degrees of Lt. knee ROM to return to activities and exercise regimen prior to surgery.     Time  6    Period  Weeks    Status  On-going      PT LONG TERM GOAL #2   Title  Patient will have improved TUG gat speed by at least 10 seconds with LRAD.     Time  6    Period  Weeks     Status  On-going      PT LONG TERM GOAL #3   Title  Patient will have at least 4+/5 strength of Lt knee for functional daily tasks.     Status  On-going      PT LONG TERM GOAL #4   Title  Patient to have FOTO score improvement by at least 20% to indicate improvement in overall function daily with ADLs.     Status  On-going      PT LONG TERM GOAL #5   Title  Pt to ambulate up/down 4 steps with hand rail with alternating steps.     Time  6    Period  Weeks    Status  On-going            Plan - 09/08/17 1118    Clinical Impression Statement  Patient tolerated session well today and completes added HEP with good form. She continues to tolerated overpresure stretching well but presents with verbal increase in discomfort. Able to get 5-115 knee ROM today, however, approximately 4 inch inferior at superior portion of scar signifciant pull/pain verbalized by patient with max available knee flexion.     Rehab Potential  Good    PT Frequency  3x / week    PT Duration  2 weeks    PT Treatment/Interventions  ADLs/Self Care Home Management;Cryotherapy;Electrical Stimulation;Gait training;Stair training;Functional mobility training;Therapeutic activities;Therapeutic exercise;Balance training;Neuromuscular re-education;Patient/family education;Manual techniques;Passive range of motion;Energy conservation    PT Next Visit Plan  Continue to progress with primary focus on ROM at this point.  progress to functional strengtheing and continue manual as needed. jendrassik maneuver to deter muscle gaurding. Update HEP, include CKC exercises.    PT Home Exercise Plan  Eval: glute set 2x15, quad  set 3x10, heel slides 3x10. 11/9: bridging x 15, LAQ 2x15, sit to stand 2x10, clamshells x 15, prone knee hang x2 minutes    Consulted and Agree with Plan of Care  Patient       Patient will benefit from skilled therapeutic intervention in order to improve the following deficits and impairments:  Abnormal gait,  Decreased balance, Decreased endurance, Decreased range of motion, Decreased strength, Difficulty walking, Increased edema, Pain, Decreased mobility  Visit Diagnosis: Stiffness of left knee, not elsewhere classified  Muscle weakness (generalized)  Decreased strength involving knee joint  Chronic pain of left knee  Acute pain of left knee     Problem List Patient Active Problem List   Diagnosis Date Noted  . OA (osteoarthritis) of knee 08/09/2017  . Anemia 07/07/2017  . Pre-op examination 06/23/2017  . Constipation 02/05/2016  . Knee pain, left 09/08/2015  . Allergic rhinitis 10/08/2014  . GAD (generalized anxiety disorder) 09/23/2014  . POSTSURGICAL HYPOTHYROIDISM 06/22/2010  . Normocytic anemia 08/04/2009  . GERD 04/20/2009  . Hyperlipidemia LDL goal <100 05/16/2008  . Essential hypertension 05/16/2008  . OSTEOPENIA 05/16/2008  . Urinary incontinence 05/16/2008   Candise CheSiona Mervin Ramires PT, DPT 11:25 AM, 09/08/17 [phone]  [facility] [address] [place], [place], [address] Phone: [phone]   Fax:  [phone]  Name: Laura EstimableJudy C Mistry MRN: [id number] Date of Birth: October 07, 1951

## 2017-09-08 NOTE — Patient Instructions (Signed)
Bridging    Slowly raise buttocks from floor, keeping stomach tight. Repeat 15 times per set. Do 2 sets per session. Do 1 sessions per day.  http://orth.exer.us/1097   Copyright  VHI. All rights reserved.   KNEE: Extension, Long Arc Quad (Weight)    Place weight around leg. Raise leg until knee is straight. Hold 3 seconds. Use 0 lb weight. 15 reps per set, 2 sets per day, 3 days per week  Copyright  VHI. All rights reserved.   SIT TO STAND: No Device    Sit with feet shoulder-width apart, on floor. Lean chest forward, raise hips up from surface. Straighten hips and knees. Weight bear equally on left and right sides. 10 reps per set, 2 sets per day, 3 days per week Place left leg closer to sitting surface.  Copyright  VHI. All rights reserved.   KNEE: Knee Hang - Prone    Lie on stomach. Place towel above knee; hang feet off surface. Keep feet straight. Hold 60 seconds. 2 reps per set,1 sets per day, 3 days per week   Copyright  VHI. All rights reserved.    SIDELYING CLAMSHELL 15 reps, 2 sets While lying on your side with your knees bent, draw up the top knee while keeping contact of your feet together.  Do not let your pelvis roll back during the lifting movement.

## 2017-09-11 ENCOUNTER — Other Ambulatory Visit: Payer: Self-pay | Admitting: Family Medicine

## 2017-09-11 DIAGNOSIS — Z1231 Encounter for screening mammogram for malignant neoplasm of breast: Secondary | ICD-10-CM

## 2017-09-12 ENCOUNTER — Ambulatory Visit (HOSPITAL_COMMUNITY): Payer: Medicare Other | Admitting: Physical Therapy

## 2017-09-12 DIAGNOSIS — M1712 Unilateral primary osteoarthritis, left knee: Secondary | ICD-10-CM | POA: Diagnosis not present

## 2017-09-12 DIAGNOSIS — M6281 Muscle weakness (generalized): Secondary | ICD-10-CM

## 2017-09-12 DIAGNOSIS — G8929 Other chronic pain: Secondary | ICD-10-CM

## 2017-09-12 DIAGNOSIS — M25662 Stiffness of left knee, not elsewhere classified: Secondary | ICD-10-CM

## 2017-09-12 DIAGNOSIS — M25562 Pain in left knee: Secondary | ICD-10-CM

## 2017-09-12 DIAGNOSIS — R29898 Other symptoms and signs involving the musculoskeletal system: Secondary | ICD-10-CM | POA: Diagnosis not present

## 2017-09-12 NOTE — Therapy (Signed)
[facility] [address] [place], [place], [address] Phone: [phone]   Fax:  [phone]  Physical Therapy Treatment  Patient Details  Name: Laura [name] MRN: [id number] Date of Birth: 1950/11/09 Referring Provider: Ollen GrossAluisio, Frank MD   Encounter Date: 09/12/2017  PT End of Session - 09/12/17 0945    Visit Number  9    Number of Visits  13    Date for PT Re-Evaluation  09/18/17    Authorization Type  UHC Medicare    Authorization Time Period  08/18/2017 -09/30/2017    Authorization - Visit Number  9    Authorization - Number of Visits  10    PT Start Time  0936    PT Stop Time  1020    PT Time Calculation (min)  44 min    Activity Tolerance  Patient tolerated treatment well    Behavior During Therapy  [facility] for tasks assessed/performed       Past Medical History:  Diagnosis Date  . Anxiety   . Cystitis   . Depression   . Gastritis   . GERD (gastroesophageal reflux disease)   . Hashimoto's thyroiditis    Hx   . Hyperlipidemia   . Hypertension   . Hypothyroidism   . Normocytic anemia 2009 Hb 9.9-11.1  . Osteopenia   . Shoulder pain   . Urinary incontinence     Past Surgical History:  Procedure Laterality Date  . Bilateral tubal ligation    . BREAST EXCISIONAL BIOPSY  2010   Excisional biopsy of benign left breast mass -lopoma   . BREAST SURGERY     left nreast biopsy  . colonscopy  2005   Dr. Katrinka BlazingSmith  . ESOPHAGOGASTRODUODENOSCOPY  12/2009   chronic gastritis  . left knee surgery Left March 1,2017  . Resection of left lobe of thyroid    . right carpal tunnel release    . rt. knee athroscopy  2004  . TOTAL ABDOMINAL HYSTERECTOMY  1994  . UMBILICAL HERNIA REPAIR    . Urethral dilation for stenosis  2009    There were no vitals filed for this visit.  Subjective Assessment - 09/12/17 0940    Subjective  Pt states she feels she's doing good and then it will just "lock up on her".  No pain currently, however has  sharp pains that run up the medial side of her knee.      Currently in Pain?  No/denies                      [facility] PT Treatment/Exercise - 09/12/17 0001      Knee/Hip Exercises: Stretches   Active Hamstring Stretch Limitations  15 sec, 3 reps     Knee: Self-Stretch to increase Flexion  Left;10 seconds    Knee: Self-Stretch Limitations  10 reps onto 12" box    Gastroc Stretch  Both;30 seconds;3 reps      Knee/Hip Exercises: Seated   Sit to [employer]Sand  20 reps      Knee/Hip Exercises: Supine   Quad Sets  Left;10 reps    Short Arc Quad Sets  Left;10 reps    Knee Extension Limitations  5    Knee Flexion  AAROM    Knee Flexion Limitations  115      Knee/Hip Exercises: Prone   Hamstring Curl  10 reps    Prone Knee Hang  3 minutes    Prone Knee Rohm and HaasHang  Limitations  with manual to posterior knee and hamstring      Manual Therapy   Manual Therapy  Soft tissue mobilization;Myofascial release    Manual therapy comments  performed seperately from all other interventions    Soft tissue mobilization  tissue mob to improve ROM both supine and prone    Myofascial Release  to decrease adhesions             PT Education - 09/12/17 0946    Education provided  Yes    Education Details  PT wearing TEDS that rolled below knee causing increased swelling above.  educated on compression hose and benefits of ordering hose that cover knee (thigh highs).  LE measured and given information to do this.    Person(s) Educated  Patient    Methods  Explanation;Handout    Comprehension  Verbalized understanding       PT Short Term Goals - 08/30/17 1200      PT SHORT TERM GOAL #1   Title  Pt to be independent with HEP for Lt knee ROM and strengthening    Time  3    Period  Weeks    Status  Achieved      PT SHORT TERM GOAL #2   Title  Pt to have 15-100 degress Lt knee mobility for functional gait.    Time  3    Period  Weeks    Status  Achieved      PT SHORT TERM GOAL #3    Title  Patient to have improved MMT grade by +1 at least to improve functional mobility and safety with LRAD.     Time  3    Period  Weeks    Status  On-going        PT Long Term Goals - 08/30/17 1200      PT LONG TERM GOAL #1   Title  Pt to have 0-130 degrees of Lt. knee ROM to return to activities and exercise regimen prior to surgery.     Time  6    Period  Weeks    Status  On-going      PT LONG TERM GOAL #2   Title  Patient will have improved TUG gat speed by at least 10 seconds with LRAD.     Time  6    Period  Weeks    Status  On-going      PT LONG TERM GOAL #3   Title  Patient will have at least 4+/5 strength of Lt knee for functional daily tasks.     Status  On-going      PT LONG TERM GOAL #4   Title  Patient to have FOTO score improvement by at least 20% to indicate improvement in overall function daily with ADLs.     Status  On-going      PT LONG TERM GOAL #5   Title  Pt to ambulate up/down 4 steps with hand rail with alternating steps.     Time  6    Period  Weeks    Status  On-going            Plan - 09/12/17 1030    Clinical Impression Statement  ROM continues to be limited at 5-115 with continued ROM therex and manual to decrease adhesions/restrictions.  Pt with HEP complaince and without gait deviations this session.  Noted with TED stockings rolled down below the knee.  Educated on compression and recommended a over  the knee, thigh high to help increase removal of fluid.  Pt given the information/measurements to order from [employer].  Pt returns to MD today; await further orders.    Rehab Potential  Good    PT Frequency  3x / week    PT Duration  2 weeks    PT Treatment/Interventions  ADLs/Self Care Home Management;Cryotherapy;Electrical Stimulation;Gait training;Stair training;Functional mobility training;Therapeutic activities;Therapeutic exercise;Balance training;Neuromuscular re-education;Patient/family education;Manual techniques;Passive range of  motion;Energy conservation    PT Next Visit Plan  Continue to progress with primary focus on ROM at this point.  progress to functional strengtheing and continue manual as needed. Await additional MD orders.    PT Home Exercise Plan  Eval: glute set 2x15, quad set 3x10, heel slides 3x10. 11/9: bridging x 15, LAQ 2x15, sit to stand 2x10, clamshells x 15, prone knee hang x2 minutes    Consulted and Agree with Plan of Care  Patient       Patient will benefit from skilled therapeutic intervention in order to improve the following deficits and impairments:  Abnormal gait, Decreased balance, Decreased endurance, Decreased range of motion, Decreased strength, Difficulty walking, Increased edema, Pain, Decreased mobility  Visit Diagnosis: Stiffness of left knee, not elsewhere classified  Muscle weakness (generalized)  Decreased strength involving knee joint  Chronic pain of left knee  Acute pain of left knee     Problem List Patient Active Problem List   Diagnosis Date Noted  . OA (osteoarthritis) of knee 08/09/2017  . Anemia 07/07/2017  . Pre-op examination 06/23/2017  . Constipation 02/05/2016  . Knee pain, left 09/08/2015  . Allergic rhinitis 10/08/2014  . GAD (generalized anxiety disorder) 09/23/2014  . POSTSURGICAL HYPOTHYROIDISM 06/22/2010  . Normocytic anemia 08/04/2009  . GERD 04/20/2009  . Hyperlipidemia LDL goal <100 05/16/2008  . Essential hypertension 05/16/2008  . OSTEOPENIA 05/16/2008  . Urinary incontinence 05/16/2008   Lurena Nida, PTA/CLT [phone]  Lurena Nida 09/12/2017, 10:35 AM  [facility] [address] [place], [place], [address] Phone: [phone]   Fax:  [phone]  Name: Laura [name] MRN: [id number] Date of Birth: Jun 16, 1951

## 2017-09-15 ENCOUNTER — Encounter (HOSPITAL_COMMUNITY): Payer: Self-pay

## 2017-09-15 ENCOUNTER — Ambulatory Visit (HOSPITAL_COMMUNITY): Payer: Medicare Other

## 2017-09-15 DIAGNOSIS — M25662 Stiffness of left knee, not elsewhere classified: Secondary | ICD-10-CM

## 2017-09-15 DIAGNOSIS — M25562 Pain in left knee: Secondary | ICD-10-CM

## 2017-09-15 DIAGNOSIS — G8929 Other chronic pain: Secondary | ICD-10-CM | POA: Diagnosis not present

## 2017-09-15 DIAGNOSIS — R29898 Other symptoms and signs involving the musculoskeletal system: Secondary | ICD-10-CM | POA: Diagnosis not present

## 2017-09-15 DIAGNOSIS — M6281 Muscle weakness (generalized): Secondary | ICD-10-CM

## 2017-09-15 NOTE — Therapy (Addendum)
[place], [place], [address] Phone: [phone]   Fax:  [phone]  Physical Therapy Treatment / Reassesment  Patient Details  Name: Laura [name] MRN: [id number] Date of Birth: Sep 25, 1951 Referring Provider: Gaynelle Arabian MD   Encounter Date: 09/15/2017  PT End of Session - 09/15/17 1024    Visit Number  10    Number of Visits  13    Date for PT Re-Evaluation  09/18/17    Authorization Type  [employer] Medicare    Authorization Time Period  08/18/2017 -09/30/2017    Authorization - Visit Number  10    Authorization - Number of Visits  10    PT Start Time  0945    PT Stop Time  1028    PT Time Calculation (min)  43 min    Activity Tolerance  Patient tolerated treatment well    Behavior During Therapy  [facility] for tasks assessed/performed       Past Medical History:  Diagnosis Date  . Anxiety   . Cystitis   . Depression   . Gastritis   . GERD (gastroesophageal reflux disease)   . Hashimoto's thyroiditis    Hx   . Hyperlipidemia   . Hypertension   . Hypothyroidism   . Normocytic anemia 2009 Hb 9.9-11.1  . Osteopenia   . Shoulder pain   . Urinary incontinence     Past Surgical History:  Procedure Laterality Date  . Bilateral tubal ligation    . BREAST EXCISIONAL BIOPSY  2010   Excisional biopsy of benign left breast mass -lopoma   . BREAST SURGERY     left nreast biopsy  . COLONOSCOPY N/A 10/06/2015   Performed by Danie Binder, MD at [place]  . COLONOSCOPY N/A 08/26/2011   Performed by Danie Binder, MD at [place]  . colonscopy  2005   Dr. Tamala Julian  . ESOPHAGOGASTRODUODENOSCOPY  12/2009   chronic gastritis  . ESOPHAGOGASTRODUODENOSCOPY (EGD) N/A 05/17/2017   Performed by Danie Binder, MD at [place]  . GIVENS CAPSULE STUDY N/A 05/30/2017   Performed by Danie Binder, MD at [place]  . left knee surgery Left March 1,2017  . LEFT UNICOMPARTMENTAL KNEE Left 08/09/2017   Performed by Gaynelle Arabian, MD at [facility] ORS  . Resection of left lobe of thyroid    . right carpal tunnel release    . rt. knee athroscopy  2004  . SAVORY DILATION N/A 05/17/2017   Performed by Danie Binder, MD at [place]  . TOTAL ABDOMINAL HYSTERECTOMY  1994  . UMBILICAL HERNIA REPAIR    . Urethral dilation for stenosis  2009    There were no vitals filed for this visit.  Subjective Assessment - 09/15/17 0949    Subjective  Patient notes she is feeling okay but feels some stitches are sticking out.     Currently in Pain?  No/denies         [facility] PT Assessment - 09/15/17 0001      Observation/Other Assessments   Focus on Therapeutic Outcomes (FOTO)   46%      AROM   Left Knee Extension  3    Left Knee Flexion  115      Strength   Left Knee Flexion  5/5    Left Knee Extension  5/5      Transfers   Five time sit to stand comments  10.61 seconds      Ambulation/Gait   Ambulation Distance (Feet)  736 Feet    Assistive device  None    Gait Pattern  Step-through pattern      Timed Up and Go Test   TUG  Normal TUG    Normal TUG (seconds)  7.79                  [facility] Adult PT Treatment/Exercise - 09/15/17 0001      Knee/Hip Exercises: Aerobic   Stationary Bike  seat 11 EOS  not charge      Knee/Hip Exercises: Machines for Strengthening   Other Machine  Biodex PROM 90% extension, 98% flexion      Knee/Hip Exercises: Supine   Heel Slides  10 reps    Knee Extension Limitations  2    Knee Flexion  AAROM    Knee Flexion Limitations  115             PT Education - 09/15/17 1109    Education provided  Yes    Education Details  Pt encouragement required post knee ROM activity and measurement. Educated patient on progress made thus far and benefit of continued focuse at home on stretches and knee flexion activities.     Person(s) Educated  Patient    Methods  Explanation    Comprehension  Verbalized understanding       PT Short Term Goals  - 09/15/17 1030      PT SHORT TERM GOAL #1   Title  Pt to be independent with HEP for Lt knee ROM and strengthening    Time  3    Period  Weeks    Status  Achieved      PT SHORT TERM GOAL #2   Title  Pt to have 15-100 degress Lt knee mobility for functional gait.    Time  3    Period  Weeks    Status  Achieved      PT SHORT TERM GOAL #3   Title  Patient to have improved MMT grade by +1 at least to improve functional mobility and safety with LRAD.     Baseline  5/5 todays    Time  3    Period  Weeks    Status  Achieved        PT Long Term Goals - 09/15/17 1030      PT LONG TERM GOAL #1   Title  Pt to have 0-130 degrees of Lt. knee ROM to return to activities and exercise regimen prior to surgery.     Baseline  2-115 degree this visit    Time  6    Period  Weeks    Status  On-going      PT LONG TERM GOAL #2   Title  Patient will have improved TUG gat speed by at least 10 seconds with LRAD.     Baseline  7.79 seconds this session    Time  6    Period  Weeks    Status  Achieved      PT LONG TERM GOAL #3   Title  Patient will have at least 4+/5 strength of Lt knee for functional daily tasks.     Baseline  5/5 this session    Status  Achieved      PT LONG TERM GOAL #4   Title  Patient to have FOTO score improvement by at least 20% to indicate improvement in overall function  daily with ADLs.     Baseline  October 11, 2023 - Met 30%    Status  Achieved      PT LONG TERM GOAL #5   Title  Pt to ambulate up/down 4 steps with hand rail with alternating steps.     Baseline  able to do so without railings, hwoever some slight fucntional weakness and stiffness     Time  6    Period  Weeks    Status  On-going            Plan - 10/10/17 1027    Clinical Impression Statement  Session focused on review goals and functional outcomes, which she has made great improvement with functional mobility and strength. G codes completed this session. Patient continues to show limitations with  pain verbally reported during ROM at end-range. She had met her strength goal, distance during timed ambulation, shown improvement in walking speed without an AD, and shown decreased reported functional FOTO score. She continue to benefit from ROM focus on Biodex machine for muscle guarding and additions to HEP going forward for 3 remaining visits. Pt education and encouragement on progress made thus far as patient was frustrated that she isn't gaining knee flexion. Plan to continue Biodex remaining visits.     Rehab Potential  Good    PT Frequency  3x / week    PT Duration  2 weeks    PT Treatment/Interventions  ADLs/Self Care Home Management;Cryotherapy;Electrical Stimulation;Gait training;Stair training;Functional mobility training;Therapeutic activities;Therapeutic exercise;Balance training;Neuromuscular re-education;Patient/family education;Manual techniques;Passive range of motion;Energy conservation    PT Next Visit Plan  Continue to progress with primary focus on ROM at this point continued Biodex.  progress to functional strengtheing and add to HEP.  Await additional MD orders.    PT Home Exercise Plan  Eval: glute set 2x15, quad set 3x10, heel slides 3x10. 11/9: bridging x 15, LAQ 2x15, sit to stand 2x10, clamshells x 15, prone knee hang x2 minutes    Consulted and Agree with Plan of Care  Patient       Patient will benefit from skilled therapeutic intervention in order to improve the following deficits and impairments:  Abnormal gait, Decreased balance, Decreased endurance, Decreased range of motion, Decreased strength, Difficulty walking, Increased edema, Pain, Decreased mobility  Visit Diagnosis: Stiffness of left knee, not elsewhere classified  Muscle weakness (generalized)  Decreased strength involving knee joint  Chronic pain of left knee  Acute pain of left knee   G-Codes - 10-Oct-2017 1153    Functional Assessment Tool Used (Outpatient Only)  Foto    Functional Limitation   Mobility: Walking and moving around    Mobility: Walking and Moving Around Current Status [phone])  At least 40 percent but less than 60 percent impaired, limited or restricted    Mobility: Walking and Moving Around Goal Status [phone])  At least 20 percent but less than 40 percent impaired, limited or restricted       Problem List Patient Active Problem List   Diagnosis Date Noted  . OA (osteoarthritis) of knee 08/09/2017  . Anemia 07/07/2017  . Pre-op examination 06/23/2017  . Constipation 02/05/2016  . Knee pain, left 09/08/2015  . Allergic rhinitis 10/08/2014  . GAD (generalized anxiety disorder) 09/23/2014  . POSTSURGICAL HYPOTHYROIDISM 06/22/2010  . Normocytic anemia 08/04/2009  . GERD 04/20/2009  . Hyperlipidemia LDL goal <100 05/16/2008  . Essential hypertension 05/16/2008  . OSTEOPENIA 05/16/2008  . Urinary incontinence 05/16/2008   Starr Lake PT, DPT 11:54 AM,  09/15/17 [place], [place], [address] Phone: [phone]   Fax:  [phone]  Name: HARLI ENGELKEN MRN: [id number] Date of Birth: 07/15/1951

## 2017-09-18 ENCOUNTER — Other Ambulatory Visit: Payer: Self-pay

## 2017-09-18 ENCOUNTER — Ambulatory Visit (HOSPITAL_COMMUNITY): Payer: Medicare Other

## 2017-09-18 ENCOUNTER — Encounter (HOSPITAL_COMMUNITY): Payer: Self-pay

## 2017-09-18 DIAGNOSIS — G8929 Other chronic pain: Secondary | ICD-10-CM | POA: Diagnosis not present

## 2017-09-18 DIAGNOSIS — R29898 Other symptoms and signs involving the musculoskeletal system: Secondary | ICD-10-CM | POA: Diagnosis not present

## 2017-09-18 DIAGNOSIS — M6281 Muscle weakness (generalized): Secondary | ICD-10-CM

## 2017-09-18 DIAGNOSIS — M25662 Stiffness of left knee, not elsewhere classified: Secondary | ICD-10-CM

## 2017-09-18 DIAGNOSIS — M25562 Pain in left knee: Secondary | ICD-10-CM

## 2017-09-18 NOTE — Therapy (Signed)
[place], [place], [address] Phone: [phone]   Fax:  [phone]  Physical Therapy Treatment  Patient Details  Name: Laura [name] MRN: [id number] Date of Birth: [birth year] Referring Provider: Gaynelle Arabian MD   Encounter Date: 09/18/2017  PT End of Session - 09/18/17 1646    Visit Number  11    Number of Visits  13    Date for PT Re-Evaluation  09/18/17    Authorization Type  [employer] Medicare    Authorization Time Period  08/18/2017 -09/30/2017    Authorization - Visit Number  1    Authorization - Number of Visits  10    PT Start Time  7412    PT Stop Time  1728    PT Time Calculation (min)  41 min    Activity Tolerance  Patient tolerated treatment well    Behavior During Therapy  [facility] for tasks assessed/performed       Past Medical History:  Diagnosis Date  . Anxiety   . Cystitis   . Depression   . Gastritis   . GERD (gastroesophageal reflux disease)   . Hashimoto's thyroiditis    Hx   . Hyperlipidemia   . Hypertension   . Hypothyroidism   . Normocytic anemia 2009 Hb 9.9-11.1  . Osteopenia   . Shoulder pain   . Urinary incontinence     Past Surgical History:  Procedure Laterality Date  . Bilateral tubal ligation    . BREAST EXCISIONAL BIOPSY  2010   Excisional biopsy of benign left breast mass -lopoma   . BREAST SURGERY     left nreast biopsy  . COLONOSCOPY N/A 10/06/2015   Performed by Danie Binder, MD at [place]  . COLONOSCOPY N/A 08/26/2011   Performed by Danie Binder, MD at [place]  . colonscopy  2005   Dr. Tamala Julian  . ESOPHAGOGASTRODUODENOSCOPY  12/2009   chronic gastritis  . ESOPHAGOGASTRODUODENOSCOPY (EGD) N/A 05/17/2017   Performed by Danie Binder, MD at [place]  . GIVENS CAPSULE STUDY N/A 05/30/2017   Performed by Danie Binder, MD at [place]  . left knee surgery Left March 1,2017  . LEFT UNICOMPARTMENTAL KNEE Left 08/09/2017   Performed by  Gaynelle Arabian, MD at [facility] ORS  . Resection of left lobe of thyroid    . right carpal tunnel release    . rt. knee athroscopy  2004  . SAVORY DILATION N/A 05/17/2017   Performed by Danie Binder, MD at [place]  . TOTAL ABDOMINAL HYSTERECTOMY  1994  . UMBILICAL HERNIA REPAIR    . Urethral dilation for stenosis  2009    There were no vitals filed for this visit.  Subjective Assessment - 09/18/17 1651    Subjective  Patient stating she had an uneventful weekend. Currently she feels that getting out of the car or standing up after sitting down for an extended period of time is teh most challenging and uncomfortable. She just wishes it wouldn't take this much time for her to get all of the motion back; PT re-assured her she is progressing really well.    Pertinent History  [facility] on the 08-09-17, Hx of Lt. meniscus repair     Limitations  Walking;Standing    How long can you sit comfortably?  No issues with elevation of knee    How long can you stand comfortably?  45 minutes  How long can you walk comfortably?  30 minutes    Patient Stated Goals  To improve pain and get motion and strength back    Currently in Pain?  No/denies       [facility] Adult PT Treatment/Exercise - 09/18/17 0001      Knee/Hip Exercises: Stretches   Knee: Self-Stretch to increase Flexion  Left    Knee: Self-Stretch Limitations  10 reps 5 second, MWM grade 3 mobilization AP for flexion      Knee/Hip Exercises: Aerobic   Stationary Bike  seat 10 for 5 mins      Knee/Hip Exercises: Standing   Terminal Knee Extension  AROM;Strengthening;Left    Terminal Knee Extension Limitations  plate 3 for PA mobiliazaiton with movement    Lateral Step Up  Left;10 reps;2 sets;Hand Hold: 1      Manual Therapy   Manual Therapy  Edema management    Manual therapy comments  performed seperately from all other interventions    Edema Management  retrogarde masssage L LE elevated         PT Education - 09/18/17 1736     Education provided  Yes    Education Details  Educated patient on edema management and safe icing techniques, emphasized improtance of elevating and performing retrograde light massage for edema. Reveiwed HEP and instructed to perform all of her stretches but pick and choose 3-4 exercises to do each day so she doesn't overwork herself. Encouraged patient to participate in walking program at gym or ride recumbent bike but not to begin any weighted strengthening for the time being.    Person(s) Educated  Patient    Methods  Explanation    Comprehension  Verbalized understanding       PT Short Term Goals - 09/15/17 1030      PT SHORT TERM GOAL #1   Title  Pt to be independent with HEP for Lt knee ROM and strengthening    Time  3    Period  Weeks    Status  Achieved      PT SHORT TERM GOAL #2   Title  Pt to have 15-100 degress Lt knee mobility for functional gait.    Time  3    Period  Weeks    Status  Achieved      PT SHORT TERM GOAL #3   Title  Patient to have improved MMT grade by +1 at least to improve functional mobility and safety with LRAD.     Baseline  5/5 todays    Time  3    Period  Weeks    Status  Achieved        PT Long Term Goals - 09/15/17 1030      PT LONG TERM GOAL #1   Title  Pt to have 0-130 degrees of Lt. knee ROM to return to activities and exercise regimen prior to surgery.     Baseline  2-115 degree this visit    Time  6    Period  Weeks    Status  On-going      PT LONG TERM GOAL #2   Title  Patient will have improved TUG gat speed by at least 10 seconds with LRAD.     Baseline  7.79 seconds this session    Time  6    Period  Weeks    Status  Achieved      PT LONG TERM GOAL #3   Title  Patient will  have at least 4+/5 strength of Lt knee for functional daily tasks.     Baseline  5/5 this session    Status  Achieved      PT LONG TERM GOAL #4   Title  Patient to have FOTO score improvement by at least 20% to indicate improvement in overall  function daily with ADLs.     Baseline  11/16 - Met 30%    Status  Achieved      PT LONG TERM GOAL #5   Title  Pt to ambulate up/down 4 steps with hand rail with alternating steps.     Baseline  able to do so without railings, hwoever some slight fucntional weakness and stiffness     Time  6    Period  Weeks    Status  On-going       Plan - 09/18/17 1740    Clinical Impression Statement  Patient is continuing to progress with therapy but continues to be limited by edema. She has progress functional strengthening to perform self mobilization with movement to progress towards LTG for ROM. Education has been provided on edema management with masage, ice, elevation, and compression. Patient is highly compliat with HEP and anticipate she will incorporate edema management well. She will continue to benefit from skilled PT to address current impairments and progress ROM and functional mobility to impove QOL.     Rehab Potential  Good    PT Frequency  3x / week    PT Duration  2 weeks    PT Treatment/Interventions  ADLs/Self Care Home Management;Cryotherapy;Electrical Stimulation;Gait training;Stair training;Functional mobility training;Therapeutic activities;Therapeutic exercise;Balance training;Neuromuscular re-education;Patient/family education;Manual techniques;Passive range of motion;Energy conservation    PT Next Visit Plan  Focus on edema management and incorporate movement with mobilization for functional strengthening, re-assess range. Continue to progress with primary focus on ROM at this point continued Biodex.  progress to functional strengtheing and add to HEP.  Await additional MD orders.    PT Home Exercise Plan  Eval: glute set 2x15, quad set 3x10, heel slides 3x10. 11/9: bridging x 15, LAQ 2x15, sit to stand 2x10, clamshells x 15, prone knee hang x2 minutes    Consulted and Agree with Plan of Care  Patient       Patient will benefit from skilled therapeutic intervention in order to  improve the following deficits and impairments:  Abnormal gait, Decreased balance, Decreased endurance, Decreased range of motion, Decreased strength, Difficulty walking, Increased edema, Pain, Decreased mobility  Visit Diagnosis: Stiffness of left knee, not elsewhere classified  Muscle weakness (generalized)  Decreased strength involving knee joint  Chronic pain of left knee  Acute pain of left knee     Problem List Patient Active Problem List   Diagnosis Date Noted  . OA (osteoarthritis) of knee 08/09/2017  . Anemia 07/07/2017  . Pre-op examination 06/23/2017  . Constipation 02/05/2016  . Knee pain, left 09/08/2015  . Allergic rhinitis 10/08/2014  . GAD (generalized anxiety disorder) 09/23/2014  . POSTSURGICAL HYPOTHYROIDISM 06/22/2010  . Normocytic anemia 08/04/2009  . GERD 04/20/2009  . Hyperlipidemia LDL goal <100 05/16/2008  . Essential hypertension 05/16/2008  . OSTEOPENIA 05/16/2008  . Urinary incontinence 05/16/2008    Debara Pickett, PT, DPT Physical Therapist with [facility]  09/18/2017 5:47 PM    [place], [place], [address] Phone: [phone]   Fax:  [phone]  Name: SIDNIE SWALLEY MRN: [id number] Date of Birth:  12/16/1950   

## 2017-09-19 ENCOUNTER — Encounter: Payer: Self-pay | Admitting: Gastroenterology

## 2017-09-19 ENCOUNTER — Other Ambulatory Visit: Payer: Self-pay

## 2017-09-19 ENCOUNTER — Ambulatory Visit (HOSPITAL_COMMUNITY): Payer: Medicare Other | Admitting: Physical Therapy

## 2017-09-19 ENCOUNTER — Ambulatory Visit (INDEPENDENT_AMBULATORY_CARE_PROVIDER_SITE_OTHER): Payer: Medicare Other | Admitting: Gastroenterology

## 2017-09-19 VITALS — BP 114/66 | HR 75 | Temp 98.4°F | Ht 64.0 in | Wt 162.0 lb

## 2017-09-19 DIAGNOSIS — K5900 Constipation, unspecified: Secondary | ICD-10-CM

## 2017-09-19 DIAGNOSIS — M6281 Muscle weakness (generalized): Secondary | ICD-10-CM | POA: Diagnosis not present

## 2017-09-19 DIAGNOSIS — M25662 Stiffness of left knee, not elsewhere classified: Secondary | ICD-10-CM | POA: Diagnosis not present

## 2017-09-19 DIAGNOSIS — R29898 Other symptoms and signs involving the musculoskeletal system: Secondary | ICD-10-CM

## 2017-09-19 DIAGNOSIS — G8929 Other chronic pain: Secondary | ICD-10-CM

## 2017-09-19 DIAGNOSIS — M25562 Pain in left knee: Secondary | ICD-10-CM | POA: Diagnosis not present

## 2017-09-19 NOTE — Patient Instructions (Signed)
I am giving you samples of the higher dose of Linzess. Take the 145 microgram capsule once each day. Let me know how this works.  We will see you in 6 months. Call if needed before then.

## 2017-09-19 NOTE — Progress Notes (Signed)
Referring Provider: Kerri PerchesSimpson, Margaret E, MD Primary Care Physician:  Kerri PerchesSimpson, Margaret E, MD  Chief Complaint  Patient presents with  . Constipation    taking pain med-had knee surgery  . Bloated  . Anemia    f/u  . loss of appetite    HPI:   Laura [name] is a 66 y.o. female presenting today with a history of constipation and GERD. Last colonoscopy in 2016. Recent EGD with gastritis, normal small bowel biopsies, multiple gastric polyps. Underwent capsule due to anemia, revealing mild gastritis. She has seen hematology with evaluation. No evidence of IDA.   Hasn't had much appetite since knee surgery Aug 10, 2017. Linzess 72 mcg daily. Feels bloated. Feels like stool is unproductive. Remains on Protonix once daily.   Past Medical History:  Diagnosis Date  . Anxiety   . Cystitis   . Depression   . Gastritis   . GERD (gastroesophageal reflux disease)   . Hashimoto's thyroiditis    Hx   . Hyperlipidemia   . Hypertension   . Hypothyroidism   . Normocytic anemia 2009 Hb 9.9-11.1  . Osteopenia   . Shoulder pain   . Urinary incontinence     Past Surgical History:  Procedure Laterality Date  . Bilateral tubal ligation    . BREAST EXCISIONAL BIOPSY  2010   Excisional biopsy of benign left breast mass -lopoma   . BREAST SURGERY     left nreast biopsy  . COLONOSCOPY  08/26/2011   SLF: 1. Internal hemorrhoids  . COLONOSCOPY N/A 10/06/2015   ZOX:WRUESLF:anal fissure or internal hemorrhoids/mild sigmoid colitis. benign colonic path   . colonscopy  2005   Dr. Katrinka BlazingSmith  . ESOPHAGOGASTRODUODENOSCOPY  12/2009   chronic gastritis  . ESOPHAGOGASTRODUODENOSCOPY N/A 05/17/2017   Dr. Darrick PennaFields; gastritis, normal small bowel biopsies, multiple gastric polyps. fundic gland polyps  . GIVENS CAPSULE STUDY N/A 05/30/2017   gastritis, no source for anemia identified  . left knee surgery Left March 1,2017  . PARTIAL KNEE ARTHROPLASTY Left 08/09/2017   Procedure: LEFT UNICOMPARTMENTAL KNEE;   Surgeon: Ollen GrossAluisio, Frank, MD;  Location: [facility];  Service: Orthopedics;  Laterality: Left;  . Resection of left lobe of thyroid    . right carpal tunnel release    . rt. knee athroscopy  2004  . SAVORY DILATION N/A 05/17/2017   Procedure: SAVORY DILATION;  Surgeon: West BaliFields, Sandi L, MD;  Location: [facility];  Service: Endoscopy;  Laterality: N/A;  . TOTAL ABDOMINAL HYSTERECTOMY  1994  . UMBILICAL HERNIA REPAIR    . Urethral dilation for stenosis  2009    Current Outpatient Medications  Medication Sig Dispense Refill  . acetaminophen (TYLENOL) 500 MG tablet Take 1,000 mg by mouth every 8 (eight) hours as needed (for pain.).     Marland Kitchen. aspirin EC 81 MG tablet Take 81 mg by mouth daily.    Marland Kitchen. atorvastatin (LIPITOR) 20 MG tablet Take 1 tablet (20 mg total) by mouth daily. 90 tablet 1  . azelastine (ASTELIN) 0.1 % nasal spray Place 2 sprays into both nostrils 2 (two) times daily. Use in each nostril as directed (Patient taking differently: Place 2 sprays into both nostrils 2 (two) times daily as needed for allergies. Use in each nostril as directed) 30 mL 12  . citalopram (CELEXA) 10 MG tablet TAKE 1 TABLET(10 MG) BY MOUTH DAILY 90 tablet 0  . gabapentin (NEURONTIN) 100 MG capsule Take 1 capsule (100 mg total) by mouth at bedtime. (  Patient taking differently: Take 100 mg by mouth daily as needed (for restless legs). ) 30 capsule 4  . hydrochlorothiazide (HYDRODIURIL) 25 MG tablet Take 1 tablet (25 mg total) by mouth daily. 90 tablet 1  . levothyroxine (SYNTHROID, LEVOTHROID) 112 MCG tablet TAKE 1 TABLET(112 MCG) BY MOUTH DAILY BEFORE BREAKFAST (Patient taking differently: Take 112 mcg by mouth daily before breakfast. DAILY BEFORE BREAKFAST) 90 tablet 0  . linaclotide (LINZESS) 72 MCG capsule Take 1 capsule (72 mcg total) by mouth daily before breakfast. 30 capsule 11  . loratadine (CLARITIN) 10 MG tablet Take 1 tablet (10 mg total) by mouth daily. (Patient taking differently: Take 10 mg by mouth daily as  needed for allergies. ) 30 tablet 3  . losartan (COZAAR) 50 MG tablet Take 1 tablet (50 mg total) by mouth daily. 90 tablet 1  . methocarbamol (ROBAXIN) 500 MG tablet Take 1 tablet (500 mg total) by mouth every 6 (six) hours as needed for muscle spasms. 80 tablet 0  . oxyCODONE (OXY IR/ROXICODONE) 5 MG immediate release tablet Take 1-2 tablets (5-10 mg total) by mouth every 4 (four) hours as needed for moderate pain or severe pain. 60 tablet 0  . pantoprazole (PROTONIX) 40 MG tablet Take 1 tablet (40 mg total) by mouth 2 (two) times daily. 180 tablet 1  . Polyethyl Glycol-Propyl Glycol (SYSTANE) 0.4-0.3 % SOLN Apply 1 drop to eye 3 (three) times daily as needed (for dry eyes.).    Marland Kitchen. traMADol (ULTRAM) 50 MG tablet Take 2 tablets (100 mg total) by mouth daily as needed for moderate pain. For knee pain. 56 tablet 0   No current facility-administered medications for this visit.     Allergies as of 09/19/2017 - Review Complete 09/19/2017  Allergen Reaction Noted  . Dilaudid [hydromorphone hcl] Other (See Comments) 03/01/2011  . Levaquin [levofloxacin] Other (See Comments) 07/28/2014  . Nsaids  01/13/2010  . Promethazine  05/17/2017    Family History  Problem Relation Age of Onset  . Pneumonia Mother   . Colon cancer Neg Hx   . Anesthesia problems Neg Hx   . Hypotension Neg Hx   . Malignant hyperthermia Neg Hx   . Pseudochol deficiency Neg Hx     Social History   Socioeconomic History  . Marital status: Married    Spouse name: None  . Number of children: 3  . Years of education: None  . Highest education level: None  Social Needs  . Financial resource strain: None  . Food insecurity - worry: None  . Food insecurity - inability: None  . Transportation needs - medical: None  . Transportation needs - non-medical: None  Occupational History  . 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 Smoking status: Former Smoker    Packs/day: 0.25    Years: 1.00     Pack years: 0.25    Types: Cigarettes    Last attempt to quit: 08/25/1972    Years since quitting: 45.0  . Smokeless tobacco: Never Used  Substance and Sexual Activity  . Alcohol use: No  . Drug use: No  . Sexual activity: Yes    Birth control/protection: Surgical    Comment: married  Other Topics Concern  . None  Social History Narrative  . None    Review of Systems: Gen: see HPI  CV: Denies chest pain, palpitations, syncope, peripheral edema, and claudication. Resp: Denies dyspnea at rest, cough, wheezing, coughing up blood, and pleurisy. GI: see HPI  Derm: Denies rash, itching, dry skin Psych: Denies depression, anxiety, memory loss, confusion. No homicidal or suicidal ideation.  Heme: Denies bruising, bleeding, and enlarged lymph nodes.  Physical Exam: BP 114/66   Pulse 75   Temp 98.4 F (36.9 C) (Oral)   Ht 5\' 4"  (1.626 m)   Wt 162 lb (73.5 kg)   BMI 27.81 kg/m  General:   Alert and oriented. No distress noted. Pleasant and cooperative.  Head:  Normocephalic and atraumatic. Eyes:  Conjuctiva clear without scleral icterus. Mouth:  Oral mucosa pink and moist. Abdomen:  +BS, soft, non-tender and non-distended. No rebound or guarding. No HSM or masses noted. Msk:  Symmetrical without gross deformities. Normal posture. Extremities:  Without edema. Neurologic:  Alert and  oriented x4 Psych:  Alert and cooperative. Normal mood and affect.

## 2017-09-19 NOTE — Therapy (Signed)
[facility] [place], [place], [address] Phone: [phone]   Fax:  [phone]  Physical Therapy Treatment  Patient Details  Name: Laura [name] MRN: [id number] Date of Birth: 05/02/1951 Referring Provider: Gaynelle Arabian MD   Encounter Date: 09/19/2017  PT End of Session - 09/19/17 1013    Visit Number  12    Number of Visits  13    Date for PT Re-Evaluation  09/18/17    Authorization Type  [employer] Medicare    Authorization Time Period  08/18/2017 -09/30/2017    Authorization - Visit Number  1    Authorization - Number of Visits  10    PT Start Time  0945    PT Stop Time  1025    PT Time Calculation (min)  40 min    Activity Tolerance  Patient tolerated treatment well    Behavior During Therapy  [facility] for tasks assessed/performed       Past Medical History:  Diagnosis Date  . Anxiety   . Cystitis   . Depression   . Gastritis   . GERD (gastroesophageal reflux disease)   . Hashimoto's thyroiditis    Hx   . Hyperlipidemia   . Hypertension   . Hypothyroidism   . Normocytic anemia 2009 Hb 9.9-11.1  . Osteopenia   . Shoulder pain   . Urinary incontinence     Past Surgical History:  Procedure Laterality Date  . Bilateral tubal ligation    . BREAST EXCISIONAL BIOPSY  2010   Excisional biopsy of benign left breast mass -lopoma   . BREAST SURGERY     left nreast biopsy  . COLONOSCOPY N/A 10/06/2015   Performed by Danie Binder, MD at [facility]  . COLONOSCOPY N/A 08/26/2011   Performed by Danie Binder, MD at [place]  . colonscopy  2005   Dr. Tamala Julian  . ESOPHAGOGASTRODUODENOSCOPY  12/2009   chronic gastritis  . ESOPHAGOGASTRODUODENOSCOPY (EGD) N/A 05/17/2017   Performed by Danie Binder, MD at [facility]  . GIVENS CAPSULE STUDY N/A 05/30/2017   Performed by Danie Binder, MD at [facility]  . left knee surgery Left March 1,2017  . LEFT UNICOMPARTMENTAL KNEE Left 08/09/2017   Performed by  Gaynelle Arabian, MD at [facility]  . Resection of left lobe of thyroid    . right carpal tunnel release    . rt. knee athroscopy  2004  . SAVORY DILATION N/A 05/17/2017   Performed by Danie Binder, MD at [facility]  . TOTAL ABDOMINAL HYSTERECTOMY  1994  . UMBILICAL HERNIA REPAIR    . Urethral dilation for stenosis  2009    There were no vitals filed for this visit.  Subjective Assessment - 09/19/17 0943    Subjective  Pt states that she is sore from yesterdays work out but it feels good.     Pertinent History  UKA on the 08-09-17, Hx of Lt. meniscus repair     Limitations  Walking;Standing    How long can you sit comfortably?  No issues with elevation of knee    How long can you stand comfortably?  45 minutes    How long can you walk comfortably?  30 minutes    Patient Stated Goals  To improve pain and get motion and strength back    Currently in Pain?  No/denies more soreness  [facility] Treatment/Exercise - 09/19/17 0001      Knee/Hip Exercises: Stretches   Active Hamstring Stretch Limitations  15 sec, 3 reps     Quad Stretch  Left;30 seconds    Knee: Self-Stretch to increase Flexion  Left    Knee: Self-Stretch Limitations  15 reps 5 second, MWM grade 3 mobilization AP for flexion    Other Knee/Hip Stretches  slant board 1 minute       Knee/Hip Exercises: Aerobic   Stationary Bike  seat 10 for 5 mins      Knee/Hip Exercises: Standing   Heel Raises  Left;10 reps    Forward Lunges  Left;10 reps    Terminal Knee Extension  AROM;Strengthening;Left;15 reps    Lateral Step Up  Left;10 reps;2 sets;Hand Hold: 1    Rocker Board  2 minutes    SLS with Vectors  3 x 20"       Knee/Hip Exercises: Prone   Hamstring Curl  10 reps    Contract/Relax to Increase Flexion  x5      Manual Therapy   Manual Therapy  Edema management    Manual therapy comments  performed seperately from all other interventions    Edema Management  retrogarde  masssage L LE elevated              PT Education - 09/18/17 1736    Education provided  Yes    Education Details  Educated patient on edema management and safe icing techniques, emphasized improtance of elevating and performing retrograde light massage for edema. Reveiwed HEP and instructed to perform all of her stretches but pick and choose 3-4 exercises to do each day so she doesn't overwork herself. Encouraged patient to participate in walking program at gym or ride recumbent bike but not to begin any weighted strengthening for the time being.    Person(s) Educated  Patient    Methods  Explanation    Comprehension  Verbalized understanding       PT Short Term Goals - 09/19/17 1025      PT SHORT TERM GOAL #1   Title  Pt to be independent with HEP for Lt knee ROM and strengthening    Time  3    Period  Weeks    Status  Achieved      PT SHORT TERM GOAL #2   Title  Pt to have 15-100 degress Lt knee mobility for functional gait.    Time  3    Period  Weeks    Status  Achieved      PT SHORT TERM GOAL #3   Title  Patient to have improved MMT grade by +1 at least to improve functional mobility and safety with LRAD.     Baseline  5/5 todays    Time  3    Period  Weeks    Status  Achieved        PT Long Term Goals - 09/19/17 1025      PT LONG TERM GOAL #1   Title  Pt to have 0-130 degrees of Lt. knee ROM to return to activities and exercise regimen prior to surgery.     Baseline  2-115 degree this visit    Time  6    Period  Weeks    Status  On-going      PT LONG TERM GOAL #2   Title  Patient will have improved TUG gat speed by at least 10 seconds with LRAD.  Baseline  7.79 seconds this session    Time  6    Period  Weeks    Status  Achieved      PT LONG TERM GOAL #3   Title  Patient will have at least 4+/5 strength of Lt knee for functional daily tasks.     Baseline  5/5 this session    Status  Achieved      PT LONG TERM GOAL #4   Title  Patient to have  FOTO score improvement by at least 20% to indicate improvement in overall function daily with ADLs.     Baseline  11/16 - Met 30%    Status  Achieved      PT LONG TERM GOAL #5   Title  Pt to ambulate up/down 4 steps with hand rail with alternating steps.     Baseline  able to do so without railings, hwoever some slight fucntional weakness and stiffness     Time  6    Period  Weeks    Status  On-going            Plan - 09/19/17 1014    Clinical Impression Statement  Pt is progressing well with ROM.  Added vector stances, quad stretch and contract relax to program.  Pt AROM currently 12-1118    Rehab Potential  Good    PT Frequency  3x / week    PT Duration  2 weeks    PT Treatment/Interventions  ADLs/Self Care Home Management;Cryotherapy;Electrical Stimulation;Gait training;Stair training;Functional mobility training;Therapeutic activities;Therapeutic exercise;Balance training;Neuromuscular re-education;Patient/family education;Manual techniques;Passive range of motion;Energy conservation    PT Next Visit Plan  Pt to be reassessed pt has progressed very well     PT Home Exercise Plan  Eval: glute set 2x15, quad set 3x10, heel slides 3x10. 11/9: bridging x 15, LAQ 2x15, sit to stand 2x10, clamshells x 15, prone knee hang x2 minutes    Consulted and Agree with Plan of Care  Patient       Patient will benefit from skilled therapeutic intervention in order to improve the following deficits and impairments:  Abnormal gait, Decreased balance, Decreased endurance, Decreased range of motion, Decreased strength, Difficulty walking, Increased edema, Pain, Decreased mobility  Visit Diagnosis: Stiffness of left knee, not elsewhere classified  Muscle weakness (generalized)  Decreased strength involving knee joint  Chronic pain of left knee     Problem List Patient Active Problem List   Diagnosis Date Noted  . OA (osteoarthritis) of knee 08/09/2017  . Anemia 07/07/2017  . Pre-op  examination 06/23/2017  . Constipation 02/05/2016  . Knee pain, left 09/08/2015  . Allergic rhinitis 10/08/2014  . GAD (generalized anxiety disorder) 09/23/2014  . POSTSURGICAL HYPOTHYROIDISM 06/22/2010  . Normocytic anemia 08/04/2009  . GERD 04/20/2009  . Hyperlipidemia LDL goal <100 05/16/2008  . Essential hypertension 05/16/2008  . OSTEOPENIA 05/16/2008  . Urinary incontinence 05/16/2008    Rayetta Humphrey, PT CLT [phone] 09/19/2017, 10:25 AM  [facility] [address] [place], [place], [address] Phone: [phone]   Fax:  [phone]  Name: Laura [name] MRN: [id number] Date of Birth: 1951/07/26

## 2017-09-19 NOTE — Assessment & Plan Note (Signed)
Will increase dosage to Linzess 145 mcg daily. Samples provided. She receives patient assistance, so if this dosage works, we will need to update this. Return in 6 months. Contact us with progress report.

## 2017-09-19 NOTE — Progress Notes (Signed)
cc'ed to pcp °

## 2017-09-20 ENCOUNTER — Ambulatory Visit (INDEPENDENT_AMBULATORY_CARE_PROVIDER_SITE_OTHER): Payer: Medicare Other | Admitting: Family Medicine

## 2017-09-20 ENCOUNTER — Encounter: Payer: Self-pay | Admitting: Family Medicine

## 2017-09-20 ENCOUNTER — Telehealth: Payer: Self-pay | Admitting: Family Medicine

## 2017-09-20 ENCOUNTER — Other Ambulatory Visit: Payer: Self-pay | Admitting: Family Medicine

## 2017-09-20 VITALS — BP 114/78 | HR 82 | Resp 16 | Ht 64.0 in | Wt 163.0 lb

## 2017-09-20 DIAGNOSIS — I10 Essential (primary) hypertension: Secondary | ICD-10-CM | POA: Diagnosis not present

## 2017-09-20 DIAGNOSIS — F411 Generalized anxiety disorder: Secondary | ICD-10-CM | POA: Diagnosis not present

## 2017-09-20 DIAGNOSIS — R630 Anorexia: Secondary | ICD-10-CM | POA: Insufficient documentation

## 2017-09-20 DIAGNOSIS — E876 Hypokalemia: Secondary | ICD-10-CM | POA: Diagnosis not present

## 2017-09-20 DIAGNOSIS — R481 Agnosia: Secondary | ICD-10-CM | POA: Diagnosis not present

## 2017-09-20 DIAGNOSIS — R634 Abnormal weight loss: Secondary | ICD-10-CM | POA: Diagnosis not present

## 2017-09-20 MED ORDER — MEGESTROL ACETATE 40 MG PO TABS: 40.0000 mg | ORAL_TABLET | Freq: Every day | ORAL | 2 refills | Status: DC

## 2017-09-20 MED ORDER — POTASSIUM CHLORIDE ER 10 MEQ PO TBCR: 10.0000 meq | EXTENDED_RELEASE_TABLET | Freq: Two times a day (BID) | ORAL | 2 refills | Status: DC

## 2017-09-20 NOTE — Assessment & Plan Note (Signed)
Controlled, no change in medication  

## 2017-09-20 NOTE — Assessment & Plan Note (Signed)
Start twice daily potassium supplement

## 2017-09-20 NOTE — Assessment & Plan Note (Signed)
Abnormal weight loss following surgery which patient reports resulted in loss of perception of taste, will try megace and follow up closely

## 2017-09-20 NOTE — Telephone Encounter (Signed)
Do you want to send her something or work in next week?

## 2017-09-20 NOTE — Assessment & Plan Note (Signed)
Acute onset following orthopedic surgery, hopefully once she gets a kick start with her appetite on megace this will resolve, if not , may need ENT evaluation

## 2017-09-20 NOTE — Assessment & Plan Note (Signed)
Unexplained loss of tasted following orthopedic surgery with 15 pound weight loss Negative  Depression screen. Trial; of megace short term with close follow up

## 2017-09-20 NOTE — Telephone Encounter (Signed)
Patient is requesting an rx because she says she has no appetite and has lost 20lbs

## 2017-09-20 NOTE — Telephone Encounter (Signed)
Schedule for the week after next (first of dec)

## 2017-09-20 NOTE — Telephone Encounter (Signed)
Needs evaluation, no availability before the week after next

## 2017-09-20 NOTE — Progress Notes (Signed)
   Skip EstimableJudy C Neuman     MRN: [id number]      DOB: Apr 16, 1951   HPI [name] is here reporting that ever since her knee replacement surgery , she hjas lost her taste, and even though she feels humngry, she has no appetite and review of her record shows a 16 pound weight loss since she was last in this office approximately 2 months ago. She denies abdominal pain , nausea or bloating, she is taking nothing for p[ain. She has recently been evaluated both by her endocrinologist and gIO Doctor Her depression screen today is negative States she just wants something for appetite so that she can start eating again as she knows she cannot continue like this  ROS Denies recent fever or chills. Denies sinus pressure, nasal congestion, ear pain or sore throat. Denies chest congestion, productive cough or wheezing. Denies chest pains, palpitations and leg swelling Denies abdominal pain, nausea, vomiting,diarrhea or constipation.   Denies dysuria, frequency, hesitancy or incontinence. Denies joint pain, swelling and limitation in mobility. Denies headaches, seizures, numbness, or tingling. Denies depression, anxiety or insomnia. Denies skin break down or rash.   PE  BP 114/78   Pulse 82   Resp 16   Ht 5\' 4"  (1.626 m)   Wt 163 lb (73.9 kg)   SpO2 97%   BMI 27.98 kg/m   Patient alert and oriented and in no cardiopulmonary distress.  HEENT: No facial asymmetry, EOMI,   oropharynx pink and moist.  Neck supple no JVD, no mass.  Chest: Clear to auscultation bilaterally.  CVS: S1, S2 no murmurs, no S3.Regular rate.  ABD: Soft non tender. No organomegaly or nass plapble, npormal BS  Ext: No edema  MS: Adequate ROM spine, shoulders, hips and knees.  Skin: Intact, no ulcerations or rash noted.  Psych: Good eye contact, normal affect. Memory intact not anxious or depressed appearing.  CNS: CN 2-12 intact, power,  normal throughout.no focal deficits noted.   Assessment &  Plan  Anorexia Unexplained loss of tasted following orthopedic surgery with 15 pound weight loss Negative  Depression screen. Trial; of megace short term with close follow up  Essential hypertension Controlled, no change in medication   Hypokalemia Start twice daily potassium supplement  GAD (generalized anxiety disorder) Controlled on citalopram which she continues to take  Loss of perception for taste Acute onset following orthopedic surgery, hopefully once she gets a kick start with her appetite on megace this will resolve, if not , may need ENT evaluation  Abnormal loss of weight Abnormal weight loss following surgery which patient reports resulted in loss of perception of taste, will try megace and follow up closely

## 2017-09-20 NOTE — Assessment & Plan Note (Signed)
Controlled on citalopram which she continues to take

## 2017-09-20 NOTE — Patient Instructions (Signed)
Keep December appointment as before, call if you need me sooner  New is potassium one twice daily  New is megace one daily for appetite, you NEED to eat to live, even if you do not want to eat, I know you want to live    Non fasting chem7 , and magnesium level 3 days before next visit please

## 2017-09-26 ENCOUNTER — Encounter (HOSPITAL_COMMUNITY): Payer: Self-pay | Admitting: Physical Therapy

## 2017-09-26 ENCOUNTER — Other Ambulatory Visit: Payer: Self-pay

## 2017-09-26 ENCOUNTER — Ambulatory Visit (HOSPITAL_COMMUNITY): Payer: Medicare Other | Admitting: Physical Therapy

## 2017-09-26 ENCOUNTER — Telehealth: Payer: Self-pay

## 2017-09-26 DIAGNOSIS — M6281 Muscle weakness (generalized): Secondary | ICD-10-CM

## 2017-09-26 DIAGNOSIS — R29898 Other symptoms and signs involving the musculoskeletal system: Secondary | ICD-10-CM

## 2017-09-26 DIAGNOSIS — M25662 Stiffness of left knee, not elsewhere classified: Secondary | ICD-10-CM | POA: Diagnosis not present

## 2017-09-26 DIAGNOSIS — M25562 Pain in left knee: Secondary | ICD-10-CM | POA: Diagnosis not present

## 2017-09-26 DIAGNOSIS — G8929 Other chronic pain: Secondary | ICD-10-CM | POA: Diagnosis not present

## 2017-09-26 NOTE — Therapy (Signed)
[place], [place], [address] Phone: [phone]   Fax:  [phone]  Physical Therapy Treatment  Patient Details  Name: MERRILY TEGELER MRN: [id number] Date of Birth: 05-26-1951 Referring Provider: Hector Shade   Encounter Date: 09/26/2017  PT End of Session - 09/26/17 1008    Visit Number  13    Number of Visits  13    Date for PT Re-Evaluation  09/18/17    Authorization Type  [employer] Medicare    Authorization Time Period  08/18/2017 -09/30/2017    Authorization - Visit Number  13    Authorization - Number of Visits  13    PT Start Time  0945    PT Stop Time  1030    PT Time Calculation (min)  45 min    Activity Tolerance  Patient tolerated treatment well    Behavior During Therapy  [facility] for tasks assessed/performed       Past Medical History:  Diagnosis Date  . Anxiety   . Cystitis   . Depression   . Gastritis   . GERD (gastroesophageal reflux disease)   . Hashimoto's thyroiditis    Hx   . Hyperlipidemia   . Hypertension   . Hypothyroidism   . Normocytic anemia 2009 Hb 9.9-11.1  . Osteopenia   . Shoulder pain   . Urinary incontinence     Past Surgical History:  Procedure Laterality Date  . Bilateral tubal ligation    . BREAST EXCISIONAL BIOPSY  2010   Excisional biopsy of benign left breast mass -lopoma   . BREAST SURGERY     left nreast biopsy  . COLONOSCOPY  08/26/2011   SLF: 1. Internal hemorrhoids  . COLONOSCOPY N/A 10/06/2015   GYK:ZLDJ fissure or internal hemorrhoids/mild sigmoid colitis. benign colonic path   . colonscopy  2005   Dr. Tamala Julian  . ESOPHAGOGASTRODUODENOSCOPY  12/2009   chronic gastritis  . ESOPHAGOGASTRODUODENOSCOPY N/A 05/17/2017   Dr. Oneida Alar; gastritis, normal small bowel biopsies, multiple gastric polyps. fundic gland polyps  . GIVENS CAPSULE STUDY N/A 05/30/2017   gastritis, no source for anemia identified  . left knee surgery Left March 1,2017  . PARTIAL KNEE  ARTHROPLASTY Left 08/09/2017   Procedure: LEFT UNICOMPARTMENTAL KNEE;  Surgeon: Gaynelle Arabian, MD;  Location: [facility];  Service: Orthopedics;  Laterality: Left;  . Resection of left lobe of thyroid    . right carpal tunnel release    . rt. knee athroscopy  2004  . SAVORY DILATION N/A 05/17/2017   Procedure: SAVORY DILATION;  Surgeon: Danie Binder, MD;  Location: [facility];  Service: Endoscopy;  Laterality: N/A;  . TOTAL ABDOMINAL HYSTERECTOMY  1994  . UMBILICAL HERNIA REPAIR    . Urethral dilation for stenosis  2009    There were no vitals filed for this visit.      [facility] PT Assessment - 09/26/17 0001      Assessment   Medical Diagnosis  Lt knee OA, Medial UKA    Referring Provider  Hector Shade    Onset Date/Surgical Date  08/09/17    Next MD Visit  08/22/2017    Prior Therapy  yes; Lt knee pain 2017      Precautions   Precautions  None    Required Braces or Orthoses  Other Brace/Splint    Knee Immobilizer - Left  Other (comment) when leaevs the hosue      Restrictions   Weight Bearing Restrictions  No      Home Film/video editor residence    Living Arrangements  Spouse/significant other    Type of [place]    Additional Comments  1 step into home      Prior Function   Level of [place]  Retired    Leisure  walking, gym, swimming      Cognition   Overall Cognitive Status  Within Functional Limits for tasks assessed      Observation/Other Assessments   Focus on Therapeutic Outcomes (FOTO)   was 23; then 54 now 72       AROM   Left Knee Extension  2 was 30 on 10/18; 13 on 11/5 ; 3 on 11/16     Left Knee Flexion  123 1as 70 on 10/18; 103 on 11/5; 115 on 11/16/      Strength   Right Knee Flexion  5/5    Right Knee Extension  5/5    Left Knee Flexion  5/5    Left Knee Extension  5/5      Transfers   Five time sit to stand comments   10.33  was 10.61       Ambulation/Gait   Ambulation Distance  (Feet)  --    Assistive device  --    Gait Pattern  --    Gait velocity  --    Gait Comments  --      Balance   Balance Assessed  Yes      Timed Up and Go Test   TUG  Normal TUG    Normal TUG (seconds)  7.79 was 37,54                   [facility] Adult PT Treatment/Exercise - 09/26/17 0001      Knee/Hip Exercises: Stretches   Active Hamstring Stretch  Left;3 reps;30 seconds    Knee: Self-Stretch to increase Flexion  Left    Knee: Self-Stretch Limitations  15 reps 5 second, MWM grade 3 mobilization AP for flexion    Other Knee/Hip Stretches  slant board 1 minute  x2      Knee/Hip Exercises: Standing   Heel Raises  Both;15 reps    Stairs  x 2 RT     Rocker Board  2 minutes    SLS  RT/LT and Ant/post       Knee/Hip Exercises: Supine   Quad Sets  Both;10 reps    Short Arc Quad Sets  Both;10 reps    Heel Slides  Left;10 reps               PT Short Term Goals - 09/26/17 1014      PT SHORT TERM GOAL #1   Title  Pt to be independent with HEP for Lt knee ROM and strengthening    Time  3    Period  Weeks    Status  Achieved      PT SHORT TERM GOAL #2   Title  Pt to have 15-100 degress Lt knee mobility for functional gait.    Time  3    Period  Weeks    Status  Achieved      PT SHORT TERM GOAL #3   Title  Patient to have improved MMT grade by +1 at least to improve functional mobility and safety with LRAD.     Baseline  5/5 todays    Time  3    Period  Weeks    Status  Achieved        PT Long Term Goals - 2017/10/17 1014      PT LONG TERM GOAL #1   Title  Pt to have 0-130 degrees of Lt. knee ROM to return to activities and exercise regimen prior to surgery.     Baseline  2-123 degree this visit    Time  6    Period  Weeks    Status  On-going      PT LONG TERM GOAL #2   Title  Patient will have improved TUG gat speed by at least 10 seconds with LRAD.     Baseline  7.79 seconds this session    Time  6    Period  Weeks    Status  Achieved       PT LONG TERM GOAL #3   Title  Patient will have at least 4+/5 strength of Lt knee for functional daily tasks.     Baseline  5/5 this session    Status  Achieved      PT LONG TERM GOAL #4   Title  Patient to have FOTO score improvement by at least 20% to indicate improvement in overall function daily with ADLs.     Baseline  11/16 - Met 30%    Status  Achieved      PT LONG TERM GOAL #5   Title  Pt to ambulate up/down 4 steps with hand rail with alternating steps.     Baseline  able to do so without railings, hwoever some slight fucntional weakness and stiffness     Time  6    Period  Weeks    Status  Achieved            Plan - 10/17/2017 1126    Clinical Impression Statement  Pt reassessed with significant gains in ROM.  Pt is no longer using an assitive device and able to complete all the activities that she wants.  Her only concern at this time is swelling and the therapist suggested compression garment at 20-30 mm HG.  Pt is ready for discharge at this time.     Rehab Potential  Good    PT Frequency  3x / week    PT Duration  2 weeks    PT Treatment/Interventions  ADLs/Self Care Home Management;Cryotherapy;Electrical Stimulation;Gait training;Stair training;Functional mobility training;Therapeutic activities;Therapeutic exercise;Balance training;Neuromuscular re-education;Patient/family education;Manual techniques;Passive range of motion;Energy conservation    PT Next Visit Plan  Discharge pt at this time     PT Home Exercise Plan  Eval: glute set 2x15, quad set 3x10, heel slides 3x10. 11/9: bridging x 15, LAQ 2x15, sit to stand 2x10, clamshells x 15, prone knee hang x2 minutes    Consulted and Agree with Plan of Care  Patient       Patient will benefit from skilled therapeutic intervention in order to improve the following deficits and impairments:  Abnormal gait, Decreased balance, Decreased endurance, Decreased range of motion, Decreased strength, Difficulty walking, Increased  edema, Pain, Decreased mobility  Visit Diagnosis: Stiffness of left knee, not elsewhere classified  Muscle weakness (generalized)  Decreased strength involving knee joint  Acute pain of left knee   G-Codes - 17-Oct-2017 1133    Functional Assessment Tool Used (Outpatient Only)  foto    Functional Limitation  Mobility: Walking and moving around    Mobility: Walking and Moving Around Goal Status [phone])  At least  20 percent but less than 40 percent impaired, limited or restricted    Mobility: Walking and Moving Around Discharge Status [phone])  At least 20 percent but less than 40 percent impaired, limited or restricted       Problem List Patient Active Problem List   Diagnosis Date Noted  . Anorexia 09/20/2017  . Hypokalemia 09/20/2017  . Loss of perception for taste 09/20/2017  . Abnormal loss of weight 09/20/2017  . OA (osteoarthritis) of knee 08/09/2017  . Anemia 07/07/2017  . Constipation 02/05/2016  . Allergic rhinitis 10/08/2014  . GAD (generalized anxiety disorder) 09/23/2014  . POSTSURGICAL HYPOTHYROIDISM 06/22/2010  . Normocytic anemia 08/04/2009  . GERD 04/20/2009  . Hyperlipidemia LDL goal <100 05/16/2008  . Essential hypertension 05/16/2008  . OSTEOPENIA 05/16/2008  . Urinary incontinence 05/16/2008    Rayetta Humphrey, PT CLT [phone] 09/26/2017, 11:36 AM  [facility] [address] [place], [place], [address] Phone: [phone]   Fax:  [phone]  Name: WREN PRYCE MRN: [id number] Date of Birth: [birth year]  PHYSICAL THERAPY DISCHARGE SUMMARY  Visits from Start of Care: 13  Current functional level related to goals / functional outcomes: As above   Remaining deficits: See above   Education / Equipment: Use of compression garments Plan: Patient agrees to discharge.  Patient goals were met. Patient is being discharged due to meeting the stated rehab goals.  ?????        Rayetta Humphrey, [place]  CLT [phone]

## 2017-09-26 NOTE — Telephone Encounter (Signed)
Pt called office to let AB know that Linzess was too much. She wants to stay with Linzess .  Routing to AB.

## 2017-09-27 MED ORDER — LINACLOTIDE 72 MCG PO CAPS: 72.0000 ug | ORAL_CAPSULE | Freq: Every day | ORAL | 3 refills | Status: DC

## 2017-09-27 NOTE — Telephone Encounter (Signed)
Noted. I sent refills for Linzess 72 into [facility].

## 2017-09-27 NOTE — Telephone Encounter (Signed)
Pt notified, LMOM

## 2017-09-28 ENCOUNTER — Encounter (HOSPITAL_COMMUNITY): Payer: Medicare Other | Admitting: Physical Therapy

## 2017-10-02 ENCOUNTER — Ambulatory Visit (HOSPITAL_COMMUNITY)
Admission: RE | Admit: 2017-10-02 | Discharge: 2017-10-02 | Disposition: A | Discharge status: Home / Self Care | Payer: Medicare Other | Source: Ambulatory Visit | Attending: Family Medicine | Admitting: Family Medicine

## 2017-10-02 DIAGNOSIS — Z1231 Encounter for screening mammogram for malignant neoplasm of breast: Secondary | ICD-10-CM | POA: Insufficient documentation

## 2017-10-02 IMAGING — MG 2D DIGITAL SCREENING BILATERAL MAMMOGRAM WITH CAD AND ADJUNCT TO
1 series · 2 of 5 positions shown · non-contrast
Comparison: Previous exam(s).

CLINICAL DATA: Screening.

EXAM:
2D DIGITAL SCREENING BILATERAL MAMMOGRAM WITH CAD AND ADJUNCT TOMO

[R CC tomo · 2 of 66 frames shown]
[frame 22/66]
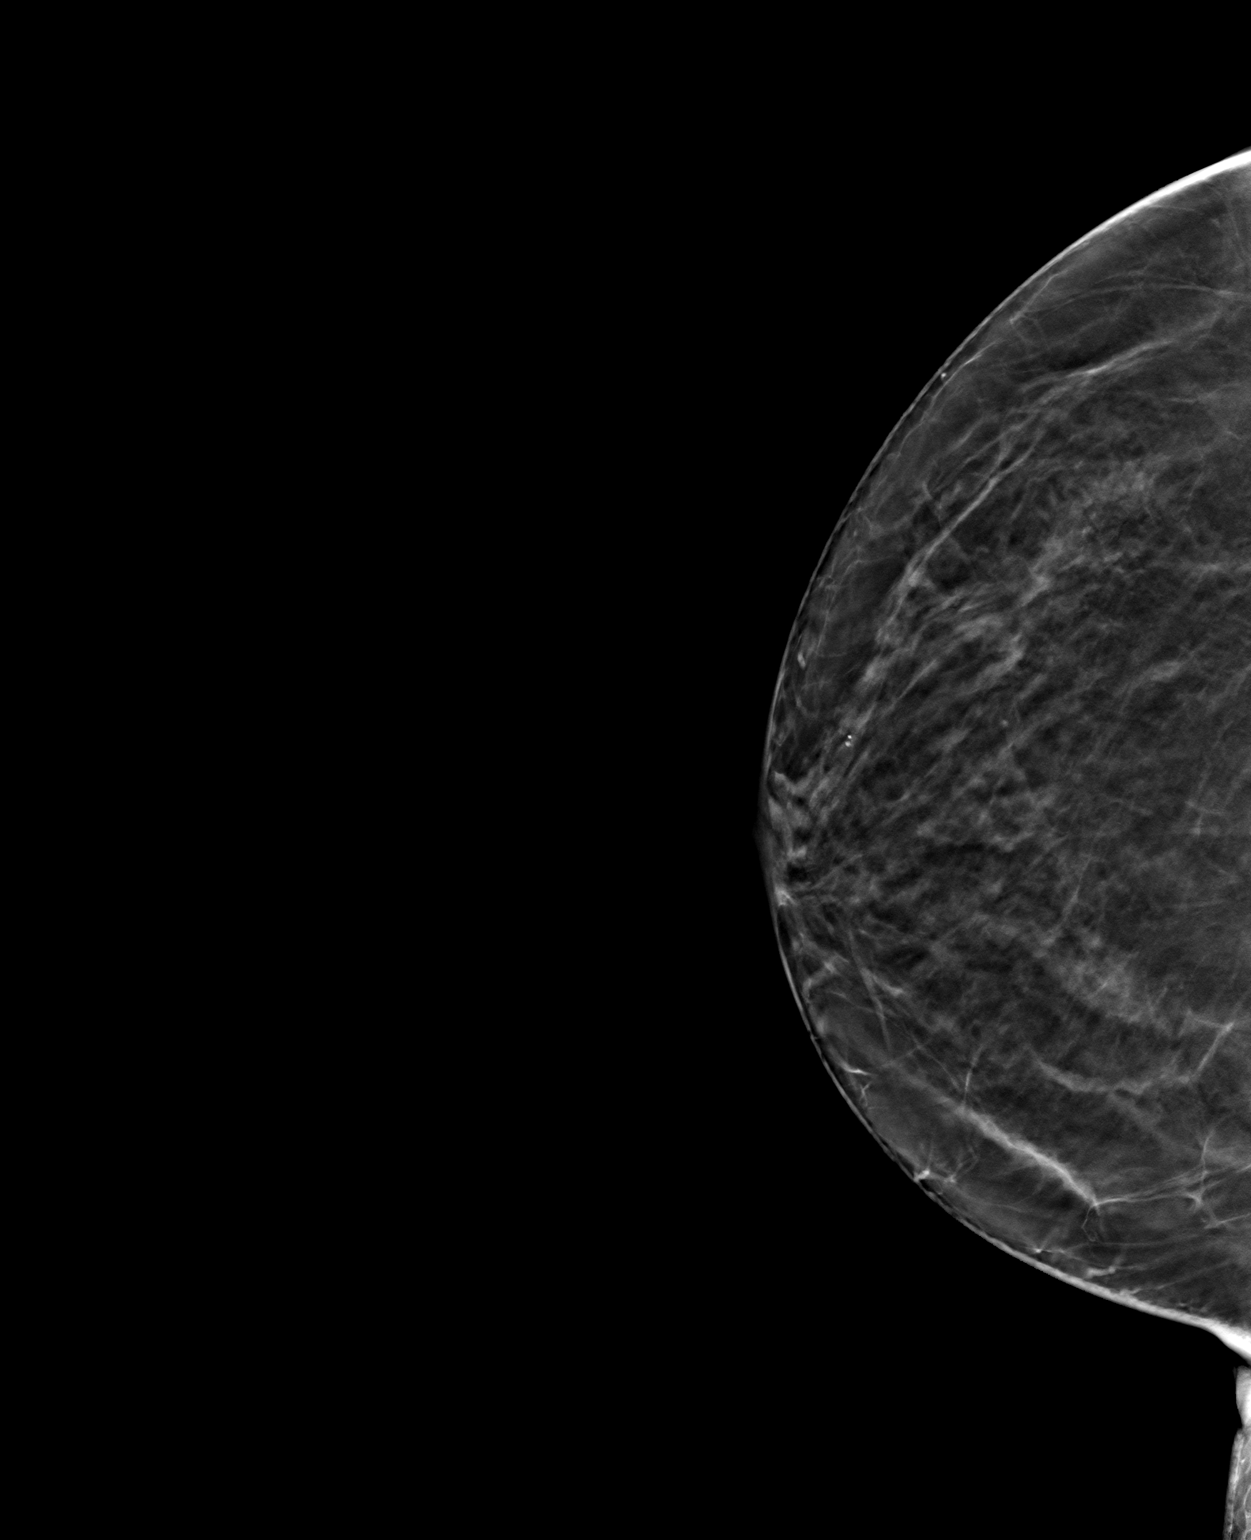
[frame 33/66]
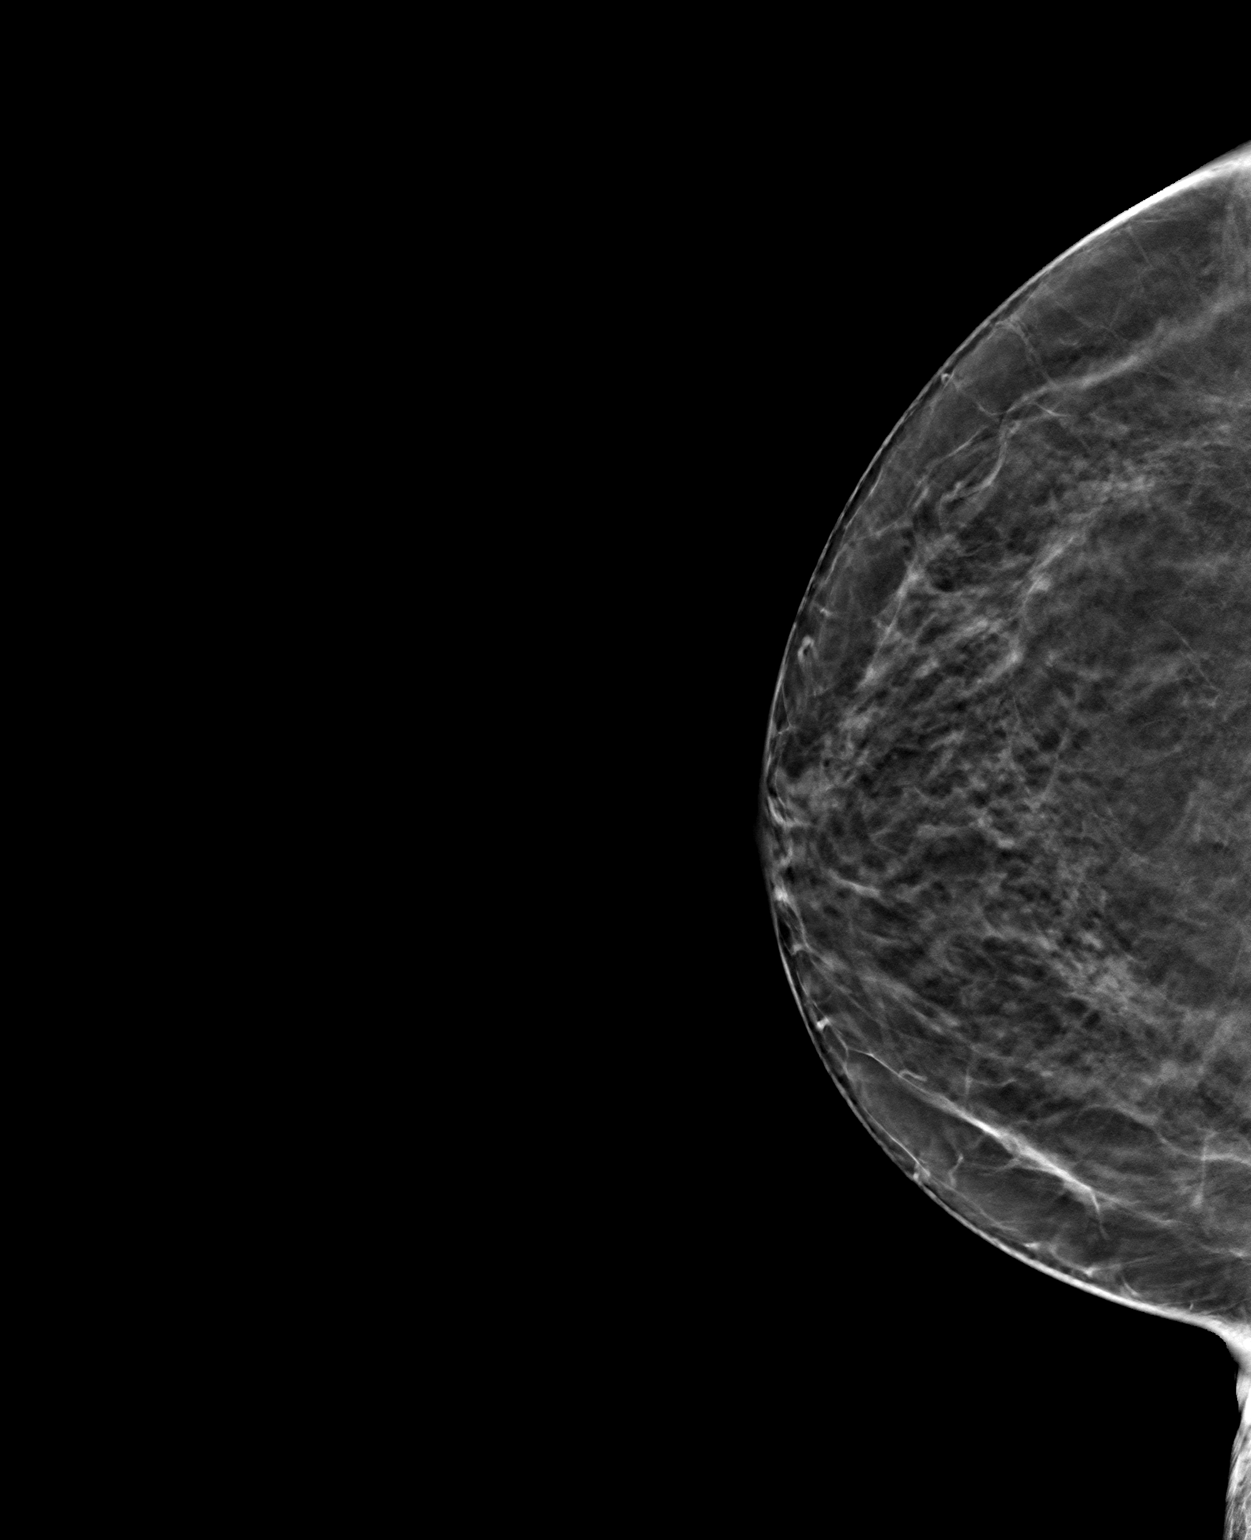

[2 of 5 positions shown; findings below may reference images not displayed]

ACR Breast Density Category b: There are scattered areas of
fibroglandular density.
FINDINGS: There are no findings suspicious for malignancy. Images were
processed with CAD.
IMPRESSION: No mammographic evidence of malignancy. A result letter of this
screening mammogram will be mailed directly to the patient.

RECOMMENDATION:
Screening mammogram in one year. (Code:[33])

BI-RADS CATEGORY  1: Negative.

## 2017-10-03 ENCOUNTER — Other Ambulatory Visit: Payer: Self-pay

## 2017-10-03 ENCOUNTER — Telehealth: Payer: Self-pay | Admitting: Family Medicine

## 2017-10-03 MED ORDER — TRAMADOL HCL 50 MG PO TABS: 100.0000 mg | ORAL_TABLET | Freq: Every day | ORAL | 1 refills | Status: DC | PRN

## 2017-10-03 NOTE — Telephone Encounter (Signed)
Refill sent.

## 2017-10-03 NOTE — Telephone Encounter (Signed)
[employer] approval for tramadol Cb#: [phone] Fax#: [phone]

## 2017-10-11 ENCOUNTER — Encounter: Payer: Self-pay | Admitting: Family Medicine

## 2017-10-11 ENCOUNTER — Ambulatory Visit (INDEPENDENT_AMBULATORY_CARE_PROVIDER_SITE_OTHER): Payer: Medicare Other | Admitting: Family Medicine

## 2017-10-11 VITALS — BP 124/80 | HR 71 | Resp 16 | Ht 64.0 in | Wt 159.0 lb

## 2017-10-11 DIAGNOSIS — I10 Essential (primary) hypertension: Secondary | ICD-10-CM

## 2017-10-11 DIAGNOSIS — E785 Hyperlipidemia, unspecified: Secondary | ICD-10-CM

## 2017-10-11 DIAGNOSIS — Z0000 Encounter for general adult medical examination without abnormal findings: Secondary | ICD-10-CM

## 2017-10-11 DIAGNOSIS — Z1211 Encounter for screening for malignant neoplasm of colon: Secondary | ICD-10-CM

## 2017-10-11 LAB — POC HEMOCCULT BLD/STL (OFFICE/1-CARD/DIAGNOSTIC): Fecal Occult Blood, POC: NEGATIVE

## 2017-10-11 NOTE — Progress Notes (Signed)
    [name]     MRN: [id number]      DOB: Jul 05, 1951  HPI: Patient is in for annual physical exam. C/o intermittent left shoulder pain with certain movement s x 3 weeks , does not want to do anything about this now. Recovering from left knee surgery gradually . Immunization is reviewed , and  Is up to date  PE: BP 124/80   Pulse 71   Resp 16   Ht 5\' 4"  (1.626 m)   Wt 159 lb (72.1 kg)   SpO2 98%   BMI 27.29 kg/m   Pleasant  female, alert and oriented x 3, in no cardio-pulmonary distress. Afebrile. HEENT No facial trauma or asymetry. Sinuses non tender.  Extra occullar muscles intact, External ears normal, tympanic membranes clear. Oropharynx moist, no exudate. Neck: supple, no adenopathy,JVD or thyromegaly.No bruits.  Chest: Clear to ascultation bilaterally.No crackles or wheezes. Non tender to palpation  Breast: No asymetry,no masses or lumps. No tenderness. No nipple discharge or inversion. No axillary or supraclavicular adenopathy  Cardiovascular system; Heart sounds normal,  S1 and  S2 ,no S3.  No murmur, or thrill. Apical beat not displaced Peripheral pulses normal.  Abdomen: Soft, non tender, no organomegaly or masses. No bruits. Bowel sounds normal. No guarding, tenderness or rebound.  Rectal:  Normal sphincter tone. No rectal mass. Guaiac negative stool.  GU: Not examined  Musculoskeletal exam: Full ROM of spine, hips , shoulders and  Decreased ROM left knee., and decreased ROM left shoulder   No deformity ,swelling or crepitus noted. No muscle wasting or atrophy.   Neurologic: Cranial nerves 2 to 12 intact. Power, tone ,sensation and reflexes normal throughout. No disturbance in gait. No tremor.  Skin: Intact, no ulceration, erythema , scaling or rash noted. Pigmentation normal throughout  Psych; Normal mood and affect. Judgement and concentration normal   Assessment & Plan:  Annual physical exam Annual exam as  documented. Counseling done  re healthy lifestyle involving commitment to 150 minutes exercise per week, heart healthy diet, and attaining healthy weight.The importance of adequate sleep also discussed.  Immunization and cancer screening needs are specifically addressed at this visit.

## 2017-10-11 NOTE — Assessment & Plan Note (Addendum)
Annual exam as documented. Counseling done  re healthy lifestyle involving commitment to 150 minutes exercise per week, heart healthy diet, and attaining healthy weight.The importance of adequate sleep also discussed.  Immunization and cancer screening needs are specifically addressed at this visit.  

## 2017-10-11 NOTE — Patient Instructions (Addendum)
F/u in  6 months,call if you need me before  Wellness visit with nurse in January  Thankful you are doing much better  All the best for 2019!!  Fasting lipid, cmp and EGFr today   It is important that you exercise regularly at least 30 minutes 5 times a week. If you develop chest pain, have severe difficulty breathing, or feel very tired, stop exercising immediately and seek medical attention    Careful not to fall

## 2017-10-18 DIAGNOSIS — I10 Essential (primary) hypertension: Secondary | ICD-10-CM | POA: Diagnosis not present

## 2017-10-18 DIAGNOSIS — E785 Hyperlipidemia, unspecified: Secondary | ICD-10-CM | POA: Diagnosis not present

## 2017-10-18 LAB — COMPLETE METABOLIC PANEL WITH GFR
AG RATIO: 1.4 (calc) (ref 1.0–2.5)
ALT: 11 U/L (ref 6–29)
AST: 12 U/L (ref 10–35)
Albumin: 3.9 g/dL (ref 3.6–5.1)
Alkaline phosphatase (APISO): 62 U/L (ref 33–130)
BILIRUBIN TOTAL: 0.5 mg/dL (ref 0.2–1.2)
BUN: 13 mg/dL (ref 7–25)
CHLORIDE: 108 mmol/L (ref 98–110)
CO2: 24 mmol/L (ref 20–32)
Calcium: 9.7 mg/dL (ref 8.6–10.4)
Creat: 0.79 mg/dL (ref 0.50–0.99)
GFR, EST AFRICAN AMERICAN: 90 mL/min/{1.73_m2} (ref >=60)
GFR, Est Non African American: 77 mL/min/{1.73_m2} (ref >=60)
Globulin: 2.7 g/dL (calc) (ref 1.9–3.7)
Glucose, Bld: 94 mg/dL (ref 65–99)
POTASSIUM: 3.4 mmol/L — AB (ref 3.5–5.3)
SODIUM: 139 mmol/L (ref 135–146)
TOTAL PROTEIN: 6.6 g/dL (ref 6.1–8.1)

## 2017-10-18 LAB — LIPID PANEL
Cholesterol: 154 mg/dL (ref <200)
HDL: 39 mg/dL — ABNORMAL LOW (ref >50)
LDL Cholesterol (Calc): 97 mg/dL (calc)
Non-HDL Cholesterol (Calc): 115 mg/dL (calc) (ref <130)
TRIGLYCERIDES: 88 mg/dL (ref <150)
Total CHOL/HDL Ratio: 3.9 (calc) (ref <5.0)

## 2017-10-26 ENCOUNTER — Other Ambulatory Visit: Payer: Self-pay

## 2017-10-26 MED ORDER — PANTOPRAZOLE SODIUM 40 MG PO TBEC: 40.0000 mg | DELAYED_RELEASE_TABLET | Freq: Two times a day (BID) | ORAL | 1 refills | Status: DC

## 2017-11-06 ENCOUNTER — Telehealth: Payer: Self-pay

## 2017-11-06 DIAGNOSIS — E890 Postprocedural hypothyroidism: Secondary | ICD-10-CM | POA: Diagnosis not present

## 2017-11-06 LAB — TSH: TSH: 0.37 mIU/L — ABNORMAL LOW (ref 0.40–4.50)

## 2017-11-06 LAB — T4, FREE: Free T4: 1.5 ng/dL (ref 0.8–1.8)

## 2017-11-06 NOTE — Telephone Encounter (Signed)
Pt brought in paperwork for her PA for the Linzess. I have faxed it to Patient Assistance program.

## 2017-11-13 ENCOUNTER — Other Ambulatory Visit: Payer: Self-pay | Admitting: Family Medicine

## 2017-11-15 ENCOUNTER — Other Ambulatory Visit: Payer: Self-pay | Admitting: Family Medicine

## 2017-11-15 ENCOUNTER — Telehealth: Payer: Self-pay

## 2017-11-15 MED ORDER — TRAMADOL HCL 50 MG PO TABS: ORAL_TABLET | ORAL | 0 refills | Status: DC

## 2017-11-15 NOTE — Telephone Encounter (Signed)
States her left knee (same one she had surgery on in Oct) is aching really bad. Said the surgeon won't give her anything for pain and everytime its going to rain her knee throbs. Wants a cream or pill that she can take for pain. Please advise

## 2017-11-15 NOTE — Progress Notes (Signed)
Tramadol for bedtime use only limited fpor knee pain

## 2017-11-15 NOTE — Telephone Encounter (Signed)
I spoke with thept and will send in tramadol for bedtime use only, as needed,top wG, she is aware

## 2017-11-21 ENCOUNTER — Encounter: Payer: Self-pay | Admitting: Family Medicine

## 2017-11-21 ENCOUNTER — Telehealth: Payer: Self-pay | Admitting: Family Medicine

## 2017-11-21 ENCOUNTER — Ambulatory Visit (INDEPENDENT_AMBULATORY_CARE_PROVIDER_SITE_OTHER): Payer: Medicare Other | Admitting: Family Medicine

## 2017-11-21 VITALS — BP 108/70 | HR 78 | Temp 100.5°F | Resp 16 | Ht 64.0 in | Wt 162.0 lb

## 2017-11-21 DIAGNOSIS — J302 Other seasonal allergic rhinitis: Secondary | ICD-10-CM | POA: Diagnosis not present

## 2017-11-21 DIAGNOSIS — E785 Hyperlipidemia, unspecified: Secondary | ICD-10-CM | POA: Diagnosis not present

## 2017-11-21 DIAGNOSIS — J0100 Acute maxillary sinusitis, unspecified: Secondary | ICD-10-CM

## 2017-11-21 DIAGNOSIS — I10 Essential (primary) hypertension: Secondary | ICD-10-CM | POA: Diagnosis not present

## 2017-11-21 MED ORDER — PENICILLIN V POTASSIUM 500 MG PO TABS: 500.0000 mg | ORAL_TABLET | Freq: Three times a day (TID) | ORAL | 0 refills | Status: DC

## 2017-11-21 NOTE — Progress Notes (Signed)
   Skip EstimableJudy C Folmer     MRN: [id number]      DOB: June 24, 1951   HPI [name] is here with a 1 day h/o acute onset of acute fever, body aches and chills, 2 other family members are il. She c/o headache , facial pressure, increased nasal pressure , sinus congestion and cough  ROS  Denies chest pains, palpitations and leg swelling Denies abdominal pain, nausea, vomiting,diarrhea or constipation.   Denies dysuria, frequency, hesitancy or incontinence. Improved pain and mobility in left shoulder Denies headaches, seizures, numbness, or tingling. Denies depression, anxiety or insomnia. Denies skin break down or rash.   PE  BP 108/70   Pulse 78   Temp (!) 100.5 F (38.1 C) (Oral)   Resp 16   Ht 5\' 4"  (1.626 m)   Wt 162 lb (73.5 kg)   SpO2 97%   BMI 27.81 kg/m   Patient alert and oriented and in no cardiopulmonary distress.  HEENT: No facial asymmetry, EOMI,   oropharynx pink and moist.  Neck supple bilateral anterior cervical adenitis and bilateral maxillary sinus tenderness, TM clear, oropharynx erythematous , no exudate  Chest: Clear to auscultation bilaterally.  CVS: S1, S2 no murmurs, no S3.Regular rate.  ABD: Soft non tender.   Ext: No edema  MS: Adequate ROM spine, , hips and knees.Decreased in left shoulder  Skin: Intact, no ulcerations or rash noted.  Psych: Good eye contact, normal affect. Memory intact not anxious or depressed appearing.  CNS: CN 2-12 intact, power,  normal throughout.no focal deficits noted.   Assessment & Plan Maxillary sinusitis, acute Antibiotic prescribed and saline nasal flushes twice daily to be started   Essential hypertension Controlled, no change in medication   Hyperlipidemia LDL goal <100 Hyperlipidemia:Low fat diet discussed and encouraged.   Lipid Panel  Lab Results  Component Value Date   CHOL 154 10/18/2017   HDL 39 (L) 10/18/2017   LDLCALC 102 (H) 06/21/2017   TRIG 88 10/18/2017   CHOLHDL 3.9 10/18/2017    Not  at goal. Updated lab needed at/ before next visit.    Allergic rhinitis Increased and slightly uncontrolled currently , advised to use medication daily

## 2017-11-21 NOTE — Patient Instructions (Signed)
F/u as before, call if you need me sooner  One week of penicillin is prescribed for acute sinus infection  drink a lot of fluids and rest  Take your medication for allergies once daily

## 2017-11-21 NOTE — Assessment & Plan Note (Addendum)
Antibiotic prescribed and saline nasal flushes twice daily to be started

## 2017-11-21 NOTE — Assessment & Plan Note (Addendum)
Hyperlipidemia:Low fat diet discussed and encouraged.   Lipid Panel  Lab Results  Component Value Date   CHOL 154 10/18/2017   HDL 39 (L) 10/18/2017   LDLCALC 102 (H) 06/21/2017   TRIG 88 10/18/2017   CHOLHDL 3.9 10/18/2017    Not at goal. Updated lab needed at/ before next visit.

## 2017-11-21 NOTE — Assessment & Plan Note (Signed)
Increased and slightly uncontrolled currently , advised to use medication daily

## 2017-11-21 NOTE — Assessment & Plan Note (Signed)
Controlled, no change in medication  

## 2017-11-23 ENCOUNTER — Other Ambulatory Visit: Payer: Self-pay | Admitting: Family Medicine

## 2017-11-23 ENCOUNTER — Telehealth: Payer: Self-pay | Admitting: Family Medicine

## 2017-11-23 MED ORDER — AZITHROMYCIN 250 MG PO TABS
ORAL_TABLET | ORAL | 0 refills | Status: DC
Start: 2017-11-23 — End: 2017-12-11

## 2017-11-23 NOTE — Telephone Encounter (Signed)
Laura [name] is calling in and states that the penicillen is making her sick, is there something else

## 2017-11-23 NOTE — Telephone Encounter (Signed)
I spoke with her and prescribed a Z pack and told her to stop pCN and have entered the adverse effect

## 2017-11-23 NOTE — Telephone Encounter (Signed)
States the penicillin is making her nauseated and vomited a couple times. Wants to know if there is something else she can take. (she hasn't took any of it this am)

## 2017-11-23 NOTE — Progress Notes (Signed)
Azithromycin sent in plaxce of penicillin she is having nausea with pCN

## 2017-12-05 NOTE — Telephone Encounter (Signed)
Close  

## 2017-12-11 ENCOUNTER — Ambulatory Visit (INDEPENDENT_AMBULATORY_CARE_PROVIDER_SITE_OTHER): Payer: Medicare Other | Admitting: "Endocrinology

## 2017-12-11 ENCOUNTER — Encounter: Payer: Self-pay | Admitting: "Endocrinology

## 2017-12-11 VITALS — BP 128/77 | HR 60 | Ht 64.0 in | Wt 160.0 lb

## 2017-12-11 DIAGNOSIS — Z780 Asymptomatic menopausal state: Secondary | ICD-10-CM

## 2017-12-11 DIAGNOSIS — E890 Postprocedural hypothyroidism: Secondary | ICD-10-CM

## 2017-12-11 NOTE — Progress Notes (Signed)
HPI  Laura [name] is a 67 y.o.-year-old female,  She has  medical h/o Goiter s/p Lt sided hemithyroidectomy 20+ years ago,  unremarkable thyroid u/s in 2012.   She remained on levothyroxine 112 g.  She is compliant taking her medication properly.  She has lost 6 pounds of weight since last visit.  She denies any heat/cold intolerance, palpitations, tremors.   Stable complains of fatigue. Pt denies feeling nodules in neck, hoarseness, dysphagia/odynophagia, SOB with lying down.  No h/o radiation tx to head or neck. No recent use of iodine supplements.   ROS: Constitutional: + weight loss, no fatigue, no subjective hyperthermia. She c/o cold hands . Eyes: no blurry vision, no xerophthalmia ENT: no sore throat, no nodules palpated in throat, no dysphagia/odynophagia, no hoarseness Cardiovascular: No chest pain, no shortness of breath, no palpitations.   Respiratory: no cough/SOB Gastrointestinal: no N/V/D/C Musculoskeletal: no muscle/joint aches Skin: no rashes Neurological: No tremors, no numbness, no tingling.   Psychiatric: no depression/anxiety  PE: BP 128/77   Pulse 60   Ht 5\' 4"  (1.626 m)   Wt 160 lb (72.6 kg)   BMI 27.46 kg/m  Wt Readings from Last 3 Encounters:  12/11/17 160 lb (72.6 kg)  11/21/17 162 lb (73.5 kg)  10/11/17 159 lb (72.1 kg)   Constitutional: overweight, not in acute distress.  Eyes: PERRLA, EOMI, no exophthalmos ENT: moist mucous membranes, no thyromegaly, no cervical lymphadenopathy Cardiovascular: RRR, No MRG Respiratory: CTA B Gastrointestinal: Abdomen is soft, nontender, bowel sounds positive.   Musculoskeletal: no deformities, strength intact in all 4 Skin: moist, warm, no rashes Neurological: no tremor with outstretched hands, DTR normal in all 4  Recent Results (from the past 2160 hour(s))  POC Hemoccult Bld/Stl (1-Cd Office Dx)     Status: None   Collection Time: 10/11/17  9:43 AM  Result Value Ref Range   Card #1 Date 10/11/2017    Fecal Occult Blood, POC Negative Negative    Comment: 51381 1r 11/20 dev 68177h 5/21  COMPLETE METABOLIC PANEL WITH GFR     Status: Abnormal   Collection Time: 10/18/17 10:23 AM  Result Value Ref Range   Glucose, Bld 94 65 - 99 mg/dL    Comment: .            Fasting reference interval .    BUN 13 7 - 25 mg/dL   Creat 0.98 1.19 - 1.47 mg/dL    Comment: For patients >43 years of age, the reference limit for Creatinine is approximately 13% higher for people identified as African-American. .    GFR, Est Non African American 77 > OR = 60 mL/min/1.40m2   GFR, Est African American 90 > OR = 60 mL/min/1.75m2   BUN/Creatinine Ratio NOT APPLICABLE 6 - 22 (calc)   Sodium 139 135 - 146 mmol/L   Potassium 3.4 (L) 3.5 - 5.3 mmol/L   Chloride 108 98 - 110 mmol/L   CO2 24 20 - 32 mmol/L   Calcium 9.7 8.6 - 10.4 mg/dL   Total Protein 6.6 6.1 - 8.1 g/dL   Albumin 3.9 3.6 - 5.1 g/dL   Globulin 2.7 1.9 - 3.7 g/dL (calc)   AG Ratio 1.4 1.0 - 2.5 (calc)   Total Bilirubin 0.5 0.2 - 1.2 mg/dL   Alkaline phosphatase (APISO) 62 33 - 130 U/L   AST 12 10 - 35 U/L   ALT 11 6 - 29 U/L  Lipid panel     Status: Abnormal   Collection  Time: 10/18/17 10:23 AM  Result Value Ref Range   Cholesterol 154 <200 mg/dL   HDL 39 (L) >40>50 mg/dL   Triglycerides 88 <981<150 mg/dL   LDL Cholesterol (Calc) 97 mg/dL (calc)    Comment: Reference range: <100 . Desirable range <100 mg/dL for primary prevention;   <70 mg/dL for patients with CHD or diabetic patients  with > or = 2 CHD risk factors. Marland Kitchen. LDL-C is now calculated using the Martin-Hopkins  calculation, which is a validated novel method providing  better accuracy than the Friedewald equation in the  estimation of LDL-C.  Horald PollenMartin SS et al. Lenox AhrJAMA. 1914;782(952013;310(19): 2061-2068  (http://education.[facility].com/faq/FAQ164)    Total CHOL/HDL Ratio 3.9 <5.0 (calc)   Non-HDL Cholesterol (Calc) 115 <130 mg/dL (calc)    Comment: For patients with diabetes plus 1 major  ASCVD risk  factor, treating to a non-HDL-C goal of <100 mg/dL  (LDL-C of <62<70 mg/dL) is considered a therapeutic  option.   T4, free     Status: None   Collection Time: 11/06/17  9:30 AM  Result Value Ref Range   Free T4 1.5 0.8 - 1.8 ng/dL  TSH     Status: Abnormal   Collection Time: 11/06/17  9:30 AM  Result Value Ref Range   TSH 0.37 (L) 0.40 - 4.50 mIU/L   ASSESSMENT: 1. Hypothyroidism, postsurgical  PLAN:  - Her thyroid function tests are consistent with appropriate replacement.    - I will continue on same dose of  Levothyroxine,  112 g by mouth every morning.   - We discussed about correct intake of levothyroxine, at fasting, with water, separated by at least 30 minutes from breakfast, and separated by more than 4 hours from calcium, iron, multivitamins, acid reflux medications (PPIs). -Patient is made aware of the fact that thyroid hormone replacement is needed for life, dose to be adjusted by periodic monitoring of thyroid function tests. -Due to absence of clinical goiter, no need for thyroid ultrasound for now.  s    She is offered screening DXA since she is 15 + years postmenopausal.  Marquis LunchGebre Erian Lariviere, MD Phone: [phone]  Fax: [phone]   12/11/2017, 8:20 PM

## 2017-12-14 NOTE — Telephone Encounter (Signed)
Received fax from [facility]. Front page not completed by pt. I have left Vm for pt to call me. Paperwork at front for her to complete and return.

## 2017-12-19 ENCOUNTER — Telehealth: Payer: Self-pay | Admitting: Family Medicine

## 2017-12-19 NOTE — Telephone Encounter (Signed)
Patient states she has had a lack of energy since 12/09/17, she says she isnt herself. She thinks something is wrong. She is anemic she says, but that's all she can think of. She states she is usually active but when she gets up moving she has to sit down and take a break and that's not like her. Cb#: [phone]   Can patient come in to see a different provider or should she wait for Dr.Simpson

## 2017-12-20 ENCOUNTER — Telehealth: Payer: Self-pay

## 2017-12-20 NOTE — Telephone Encounter (Signed)
Pt brought paperwork by for the patient assistance for Linzess. She said she has been hurting in her epigastric region almost constantly for the last 2 weeks. She cannot eat very much because it makes it worse. She thinks she is losing weight because she can't eat. She said she ate a little breakfast and that made her hurt worse. But she ate some ice cream and that kind of soothed it. She takes protonix once a day. Please advise!

## 2017-12-20 NOTE — Telephone Encounter (Signed)
Pt brought by the paperwork and I am faxing to [employer].

## 2017-12-20 NOTE — Telephone Encounter (Signed)
Pt is scheduled to see Tobi Bastosnna tomorrow on 12/21/2017 at 10:30 Am.

## 2017-12-20 NOTE — Telephone Encounter (Signed)
She needs an urgent office visit. If there is not one this week, let me know.

## 2017-12-20 NOTE — Telephone Encounter (Signed)
Can see if this acute enough to be seen by another provider today.

## 2017-12-21 ENCOUNTER — Encounter: Payer: Self-pay | Admitting: Gastroenterology

## 2017-12-21 ENCOUNTER — Telehealth: Payer: Self-pay | Admitting: *Deleted

## 2017-12-21 ENCOUNTER — Ambulatory Visit (INDEPENDENT_AMBULATORY_CARE_PROVIDER_SITE_OTHER): Payer: Medicare Other | Admitting: Family Medicine

## 2017-12-21 ENCOUNTER — Ambulatory Visit (INDEPENDENT_AMBULATORY_CARE_PROVIDER_SITE_OTHER): Payer: Medicare Other | Admitting: Gastroenterology

## 2017-12-21 ENCOUNTER — Other Ambulatory Visit: Payer: Self-pay

## 2017-12-21 ENCOUNTER — Encounter: Payer: Self-pay | Admitting: Family Medicine

## 2017-12-21 ENCOUNTER — Encounter: Payer: Self-pay | Admitting: *Deleted

## 2017-12-21 VITALS — BP 130/86 | HR 80 | Temp 98.8°F | Resp 18 | Ht 64.0 in | Wt 153.0 lb

## 2017-12-21 VITALS — BP 121/69 | HR 72 | Temp 97.7°F | Ht 64.0 in | Wt 154.4 lb

## 2017-12-21 DIAGNOSIS — R5383 Other fatigue: Secondary | ICD-10-CM

## 2017-12-21 DIAGNOSIS — R634 Abnormal weight loss: Secondary | ICD-10-CM | POA: Diagnosis not present

## 2017-12-21 DIAGNOSIS — R1013 Epigastric pain: Secondary | ICD-10-CM

## 2017-12-21 NOTE — Patient Instructions (Signed)
I will check on your lab and CAT scan reports.  I will keep in touch if there are any abnormalities.  I will report your condition to [name], MD, on Monday

## 2017-12-21 NOTE — Telephone Encounter (Signed)
[facility] and no PA is required for CT ABD/PELVIS W/WO CONTRAST.

## 2017-12-21 NOTE — Progress Notes (Signed)
cc'ed to pcp °

## 2017-12-21 NOTE — Progress Notes (Addendum)
REVIEWED. PT HAS HAD 3 TCS, 2 EGDs, 1 GIVENS CAPSULE, AND 2 CT SCANS SINCE 2007. SHE HAS CHRONIC EPIGASTRIC PAIN. HER MOST RECENT EGD WAS JUL 2018. SHE DOES NOT NEED ANY ADDITIONAL ENDOSCOPY OR IMAGING SHE HAS NONULCER DYSPEPSIA. SHE IS ALREADY TAKING CELEXA AND NEURONTIN. SHE CAN SEE MENTAL HEALTH FOR ANXIETY AND CAN USE VISCOUS LIDOCINE PRN EPIGASTRIC PAIN.   Referring Provider: Kerri Perches, MD Primary Care Physician:  Kerri Perches, MD Primary GI: Dr. Darrick Penna   Chief Complaint  Patient presents with  . Abdominal Pain    x 2 weeks, comes/goes    HPI:   Laura [name] is a 67 y.o. female presenting today with a history of constipation and GERD. Last colonoscopy in 2016. Recent EGD 2018 with gastritis, normal small bowel biopsies, multiple gastric polyps. Underwent capsule due to anemia, revealing mild gastritis. She has seen hematology with evaluation. No evidence of IDA. Last seen Nov 2018 and increased Linzess to 145 mcg daily. She has lost 8 lbs since last seen, with a total of over 30 lbs in last year, normally weighing in the mid 170s and high of upper 180s. She began losing weight more quickly since August of 2018, when she states after her surgery she had no appetite. She had no abdominal pain or other alarm features when last seen.   Reports onset of pain 2 weeks ago located in epigastric region. No appetite. Has woken up with pain. No worsening of pain with eating. No nausea or vomiting. Feels cold all the time. Feels extremely fatigued as if she could just "fall out". No reflux or dysphagia. No GI bleeding.   Past Medical History:  Diagnosis Date  . Anxiety   . Cystitis   . Depression   . Gastritis   . GERD (gastroesophageal reflux disease)   . Hashimoto's thyroiditis    Hx   . Hyperlipidemia   . Hypertension   . Hypothyroidism   . Normocytic anemia 2009 Hb 9.9-11.1  . Osteopenia   . Shoulder pain   . Urinary incontinence     Past Surgical History:    Procedure Laterality Date  . Bilateral tubal ligation    . BREAST EXCISIONAL BIOPSY  2010   Excisional biopsy of benign left breast mass -lopoma   . BREAST SURGERY     left nreast biopsy  . COLONOSCOPY  08/26/2011   SLF: 1. Internal hemorrhoids  . COLONOSCOPY N/A 10/06/2015   WUJ:WJXB fissure or internal hemorrhoids/mild sigmoid colitis. benign colonic path   . colonscopy  2005   Dr. Katrinka Blazing  . ESOPHAGOGASTRODUODENOSCOPY  12/2009   chronic gastritis  . ESOPHAGOGASTRODUODENOSCOPY N/A 05/17/2017   Dr. Darrick Penna; gastritis, normal small bowel biopsies, multiple gastric polyps. fundic gland polyps  . GIVENS CAPSULE STUDY N/A 05/30/2017   gastritis, no source for anemia identified  . left knee surgery Left March 1,2017  . PARTIAL KNEE ARTHROPLASTY Left 08/09/2017   Procedure: LEFT UNICOMPARTMENTAL KNEE;  Surgeon: Ollen Gross, MD;  Location: [facility];  Service: Orthopedics;  Laterality: Left;  . Resection of left lobe of thyroid    . right carpal tunnel release    . rt. knee athroscopy  2004  . SAVORY DILATION N/A 05/17/2017   Procedure: SAVORY DILATION;  Surgeon: West Bali, MD;  Location: [facility];  Service: Endoscopy;  Laterality: N/A;  . TOTAL ABDOMINAL HYSTERECTOMY  1994  . UMBILICAL HERNIA REPAIR    . Urethral dilation for stenosis  2009  Current Outpatient Medications  Medication Sig Dispense Refill  . acetaminophen (TYLENOL) 500 MG tablet Take 1,000 mg by mouth every 8 (eight) hours as needed (for pain.).     Marland Kitchen. aspirin EC 81 MG tablet Take 81 mg by mouth daily.    Marland Kitchen. atorvastatin (LIPITOR) 20 MG tablet Take 1 tablet (20 mg total) by mouth daily. 90 tablet 1  . azelastine (ASTELIN) 0.1 % nasal spray Place 2 sprays into both nostrils 2 (two) times daily. Use in each nostril as directed (Patient taking differently: Place 2 sprays into both nostrils 2 (two) times daily as needed for allergies. Use in each nostril as directed) 30 mL 12  . citalopram (CELEXA) 10 MG tablet TAKE 1  TABLET(10 MG) BY MOUTH DAILY 90 tablet 0  . gabapentin (NEURONTIN) 100 MG capsule Take 1 capsule (100 mg total) by mouth at bedtime. (Patient taking differently: Take 100 mg by mouth daily as needed (for restless legs). ) 30 capsule 4  . hydrochlorothiazide (HYDRODIURIL) 25 MG tablet Take 1 tablet (25 mg total) by mouth daily. 90 tablet 1  . levothyroxine (SYNTHROID, LEVOTHROID) 112 MCG tablet TAKE 1 TABLET(112 MCG) BY MOUTH DAILY BEFORE BREAKFAST (Patient taking differently: Take 112 mcg by mouth daily before breakfast. DAILY BEFORE BREAKFAST) 90 tablet 0  . linaclotide (LINZESS) 72 MCG capsule Take 1 capsule (72 mcg total) by mouth daily before breakfast. 90 capsule 3  . loratadine (CLARITIN) 10 MG tablet Take 1 tablet (10 mg total) by mouth daily. (Patient taking differently: Take 10 mg by mouth daily as needed for allergies. ) 30 tablet 3  . losartan (COZAAR) 50 MG tablet TAKE 1 TABLET BY MOUTH  DAILY 90 tablet 1  . megestrol (MEGACE) 40 MG tablet Take 1 tablet (40 mg total) by mouth daily. 30 tablet 2  . methocarbamol (ROBAXIN) 500 MG tablet Take 1 tablet (500 mg total) by mouth every 6 (six) hours as needed for muscle spasms. 80 tablet 0  . pantoprazole (PROTONIX) 40 MG tablet Take 1 tablet (40 mg total) by mouth 2 (two) times daily. 180 tablet 1  . Polyethyl Glycol-Propyl Glycol (SYSTANE) 0.4-0.3 % SOLN Apply 1 drop to eye 3 (three) times daily as needed (for dry eyes.).    Marland Kitchen. potassium chloride (K-DUR) 10 MEQ tablet Take 1 tablet (10 mEq total) by mouth 2 (two) times daily. 60 tablet 2   No current facility-administered medications for this visit.     Allergies as of 12/21/2017 - Review Complete 12/21/2017  Allergen Reaction Noted  . Dilaudid [hydromorphone hcl] Other (See Comments) 03/01/2011  . Levaquin [levofloxacin] Other (See Comments) 07/28/2014  . Nsaids  01/13/2010  . Penicillins Nausea Only 11/23/2017  . Promethazine  05/17/2017    Family History  Problem Relation Age of  Onset  . Pneumonia Mother   . Colon cancer Neg Hx   . Anesthesia problems Neg Hx   . Hypotension Neg Hx   . Malignant hyperthermia Neg Hx   . Pseudochol deficiency Neg Hx     Social History   Socioeconomic History  . Marital status: Married    Spouse name: None  . Number of children: 3  . Years of education: None  . Highest education level: None  Social Needs  . Financial resource strain: None  . Food insecurity - worry: None  . Food insecurity - inability: None  . Transportation needs - medical: None  . Transportation needs - non-medical: None  Occupational History  . 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 Smoking status: Former Smoker    Packs/day: 0.25    Years: 1.00    Pack years: 0.25    Types: Cigarettes    Last attempt to quit: 08/25/1972    Years since quitting: 45.3  . Smokeless tobacco: Never Used  Substance and Sexual Activity  . Alcohol use: No  . Drug use: No  . Sexual activity: Yes    Birth control/protection: Surgical    Comment: married  Other Topics Concern  . None  Social History Narrative  . None    Review of Systems: Gen: see HPI  CV: Denies chest pain, palpitations, syncope, peripheral edema, and claudication. Resp: Denies dyspnea at rest, cough, wheezing, coughing up blood, and pleurisy. GI: see HPI  Derm: Denies rash, itching, dry skin Psych: Denies depression, anxiety, memory loss, confusion. No homicidal or suicidal ideation.  Heme: Denies bruising, bleeding, and enlarged lymph nodes.  Physical Exam: BP 121/69   Pulse 72   Temp 97.7 F (36.5 C) (Oral)   Ht 5\' 4"  (1.626 m)   Wt 154 lb 6.4 oz (70 kg)   BMI 26.50 kg/m  General:   Alert and oriented. No distress noted. Pleasant and cooperative.  Head:  Normocephalic and atraumatic. Eyes:  Conjuctiva clear without scleral icterus. Mouth:  Oral mucosa pink and moist.  Abdomen:  +BS, soft, TTP LUQ and epigastric and non-distended. No rebound or  guarding. No HSM or masses noted. Msk:  Symmetrical without gross deformities. Normal posture. Extremities:  Without edema. Neurologic:  Alert and  oriented x4 Psych:  Alert and cooperative. Normal mood and affect.

## 2017-12-21 NOTE — Progress Notes (Signed)
Chief Complaint  Patient presents with  . Fatigue    x 2 weeks  She is here with vague complaints.  She states she said "fatigue" since December 09, 2017.  She feels like it came on suddenly.  On that day she felt tired and did not want to do anything.  She has felt like that every day since then.  She still feels happy she does not feel stressed.  She thinks there is a medical issue.  She is under Dr. Isidoro Donning care for hypothyroidism and is well controlled.  Her TSH on last test, within 2 months, was a little bit low indicating her dose is therapeutic.  This is unlikely causing fatigue. She saw her GI doctor today.  She is also been losing weight.  She is very upset about losing weight.  I looked at a weight chart and she is actually been losing since last August.  She has a CAT scan scheduled for tomorrow.  She has blood work scheduled for later today. She does have a history of anemia.  Her hemoglobin was 10.5 when last checked in October.  This will be repeated today.  We discussed.  Fatigue in general.  Sometimes it is organic or from a medical problem or disease process.  Sometimes it is emotional.  She says she sleeps well and does not feel sad.  She is convinced she has something terrible wrong with her and starts to cry she talks about her fears of having cancer.  This is a patient of Claris Che Simpson's.  She asked to be seen today acutely for sick visit.  I reviewed her history of her recent test results and her history of weight loss.  I reassured her that I do not see anything wrong with her that is urgent today.  She is to follow-up with Syliva Overman on her usual schedule.  Patient Active Problem List   Diagnosis Date Noted  . Post-menopausal 12/11/2017  . Hypokalemia 09/20/2017  . Loss of weight 09/20/2017  . OA (osteoarthritis) of knee 08/09/2017  . Anemia 07/07/2017  . Epigastric pain 03/02/2017  . Maxillary sinusitis, acute 03/02/2017  . Constipation 02/05/2016  .  Allergic rhinitis 10/08/2014  . GAD (generalized anxiety disorder) 09/23/2014  . POSTSURGICAL HYPOTHYROIDISM 06/22/2010  . Abdominal pain, epigastric 12/15/2009  . Normocytic anemia 08/04/2009  . GERD 04/20/2009  . Hyperlipidemia LDL goal <100 05/16/2008  . Essential hypertension 05/16/2008  . OSTEOPENIA 05/16/2008  . Urinary incontinence 05/16/2008    Outpatient Encounter Medications as of 12/21/2017  Medication Sig  . acetaminophen (TYLENOL) 500 MG tablet Take 1,000 mg by mouth every 8 (eight) hours as needed (for pain.).   Marland Kitchen aspirin EC 81 MG tablet Take 81 mg by mouth daily.  Marland Kitchen atorvastatin (LIPITOR) 20 MG tablet Take 1 tablet (20 mg total) by mouth daily.  Marland Kitchen azelastine (ASTELIN) 0.1 % nasal spray Place 2 sprays into both nostrils 2 (two) times daily. Use in each nostril as directed (Patient taking differently: Place 2 sprays into both nostrils 2 (two) times daily as needed for allergies. Use in each nostril as directed)  . citalopram (CELEXA) 10 MG tablet TAKE 1 TABLET(10 MG) BY MOUTH DAILY  . gabapentin (NEURONTIN) 100 MG capsule Take 1 capsule (100 mg total) by mouth at bedtime. (Patient taking differently: Take 100 mg by mouth daily as needed (for restless legs). )  . hydrochlorothiazide (HYDRODIURIL) 25 MG tablet Take 1 tablet (25 mg total) by mouth daily.  Marland Kitchen  levothyroxine (SYNTHROID, LEVOTHROID) 112 MCG tablet TAKE 1 TABLET(112 MCG) BY MOUTH DAILY BEFORE BREAKFAST (Patient taking differently: Take 112 mcg by mouth daily before breakfast. DAILY BEFORE BREAKFAST)  . linaclotide (LINZESS) 72 MCG capsule Take 1 capsule (72 mcg total) by mouth daily before breakfast.  . loratadine (CLARITIN) 10 MG tablet Take 1 tablet (10 mg total) by mouth daily. (Patient taking differently: Take 10 mg by mouth daily as needed for allergies. )  . losartan (COZAAR) 50 MG tablet TAKE 1 TABLET BY MOUTH  DAILY  . megestrol (MEGACE) 40 MG tablet Take 1 tablet (40 mg total) by mouth daily.  . methocarbamol  (ROBAXIN) 500 MG tablet Take 1 tablet (500 mg total) by mouth every 6 (six) hours as needed for muscle spasms.  . pantoprazole (PROTONIX) 40 MG tablet Take 1 tablet (40 mg total) by mouth 2 (two) times daily.  Bertram Gala. Polyethyl Glycol-Propyl Glycol (SYSTANE) 0.4-0.3 % SOLN Apply 1 drop to eye 3 (three) times daily as needed (for dry eyes.).  Marland Kitchen. potassium chloride (K-DUR) 10 MEQ tablet Take 1 tablet (10 mEq total) by mouth 2 (two) times daily.   No facility-administered encounter medications on file as of 12/21/2017.     Allergies  Allergen Reactions  . Dilaudid [Hydromorphone Hcl] Other (See Comments)    Confusion;altered state of mind  . Levaquin [Levofloxacin] Other (See Comments)    Dry throat, no raste  . Nsaids     REACTION: per GI pt no longer to use NSAIDS  . Penicillins Nausea Only  . Promethazine     Legs itching, RESTLESS LEGS    Review of Systems  Constitutional: Positive for activity change, appetite change, fatigue and unexpected weight change.       Consistently last since August.  New fatigue for 2 weeks.  HENT: Negative for congestion, dental problem and trouble swallowing.   Eyes: Negative for photophobia and visual disturbance.  Respiratory: Negative for cough and shortness of breath.   Cardiovascular: Negative for chest pain and palpitations.  Gastrointestinal: Negative for abdominal pain, constipation and diarrhea.       Chronic constipation treated with Linzess with good results  Genitourinary: Negative for difficulty urinating and frequency.       Has had a hysterectomy  Musculoskeletal: Negative for arthralgias and myalgias.  Skin: Negative for color change and rash.  Neurological: Negative for dizziness and headaches.  Psychiatric/Behavioral: Negative for dysphoric mood and sleep disturbance. The patient is not nervous/anxious.        Past, patient denies.  She is only worried about her health because of her weight loss    BP 130/86 (BP Location: Left Arm,  Patient Position: Sitting, Cuff Size: Normal)   Pulse 80   Temp 98.8 F (37.1 C) (Temporal)   Resp 18   Ht 5\' 4"  (1.626 m)   Wt 153 lb 0.6 oz (69.4 kg)   SpO2 100%   BMI 26.27 kg/m   Physical Exam  Constitutional: She is oriented to person, place, and time. She appears well-developed and well-nourished.  HENT:  Head: Normocephalic and atraumatic.  Right Ear: External ear normal.  Left Ear: External ear normal.  Mouth/Throat: Oropharynx is clear and moist.  Eyes: Conjunctivae are normal. Pupils are equal, round, and reactive to light.  Neck: Normal range of motion. Neck supple. No thyromegaly present.  Cardiovascular: Normal rate, regular rhythm and normal heart sounds.  Pulmonary/Chest: Effort normal and breath sounds normal. No respiratory distress.  Abdominal: Soft. Bowel sounds are normal.  No mass, no organomegaly  Musculoskeletal: Normal range of motion. She exhibits no edema.  Lymphadenopathy:    She has no cervical adenopathy.    She has no axillary adenopathy.       Right: No inguinal and no supraclavicular adenopathy present.       Left: No inguinal and no supraclavicular adenopathy present.  Neurological: She is alert and oriented to person, place, and time.  Gait normal  Skin: Skin is warm and dry.  Psychiatric: She has a normal mood and affect. Her behavior is normal. Thought content normal.  Nursing note and vitals reviewed.   ASSESSMENT/PLAN:  1. Fatigue, unspecified type See HPI, discussed   Patient Instructions  I will check on your lab and CAT scan reports.  I will keep in touch if there are any abnormalities.  I will report your condition to Syliva Overman, MD, on Monday   Eustace Moore, MD

## 2017-12-21 NOTE — Patient Instructions (Signed)
Please have blood work today.  I have ordered a CT scan for tomorrow.  We will get to the bottom of this, please don't worry!  It was a pleasure to see you today. I strive to create trusting relationships with patients to provide genuine, compassionate, and quality care. I value your feedback. If you receive a survey regarding your visit,  I greatly appreciate you the taking time to fill this out.   Gelene MinkAnna W. Shatori Bertucci, PhD, ANP-BC [facility]

## 2017-12-21 NOTE — Assessment & Plan Note (Signed)
New onset epigastric pain for past 2 weeks with associated decreased appetite. Significant weight loss noted, especially over last few months. EGD is on file from July 2018. No dysphagia, nausea, vomiting. Does not appear biliary in nature. I am concerned regarding occult malignancy, especially in light of weight loss, loss of appetite, fatigue. Checking CBC, CMP, iron studies, B 12 today (which were already planned for March by Hematology), and stat CT scan with/without contrast tomorrow, specifically evaluating pancreas. She was in tears at the end of visit, worried about cancer. Reassured her not to worry and that we would be doing all we could to evaluate this. She was calm when leaving and smiling. Follow closely and will make further recommendations once labs and imaging are received.

## 2017-12-22 ENCOUNTER — Ambulatory Visit (HOSPITAL_COMMUNITY): Payer: Medicare Other

## 2017-12-22 ENCOUNTER — Ambulatory Visit (HOSPITAL_COMMUNITY)
Admission: RE | Admit: 2017-12-22 | Discharge: 2017-12-22 | Disposition: A | Discharge status: Home / Self Care | Payer: Medicare Other | Source: Ambulatory Visit | Attending: Gastroenterology | Admitting: Gastroenterology

## 2017-12-22 DIAGNOSIS — R1013 Epigastric pain: Secondary | ICD-10-CM | POA: Diagnosis present

## 2017-12-22 DIAGNOSIS — K7689 Other specified diseases of liver: Secondary | ICD-10-CM | POA: Insufficient documentation

## 2017-12-22 LAB — CBC WITH DIFFERENTIAL
Basophils Absolute: 0 10*3/uL (ref 0.0–0.2)
Basos: 1 %
EOS (ABSOLUTE): 0.1 10*3/uL (ref 0.0–0.4)
EOS: 2 %
HEMATOCRIT: 32.6 % — AB (ref 34.0–46.6)
HEMOGLOBIN: 9.9 g/dL — AB (ref 11.1–15.9)
Immature Grans (Abs): 0 10*3/uL (ref 0.0–0.1)
Immature Granulocytes: 0 %
Lymphocytes Absolute: 2.7 10*3/uL (ref 0.7–3.1)
Lymphs: 46 %
MCH: 26.7 pg (ref 26.6–33.0)
MCHC: 30.4 g/dL — ABNORMAL LOW (ref 31.5–35.7)
MCV: 88 fL (ref 79–97)
MONOCYTES: 7 %
Monocytes Absolute: 0.4 10*3/uL (ref 0.1–0.9)
NEUTROS ABS: 2.6 10*3/uL (ref 1.4–7.0)
Neutrophils: 44 %
RBC: 3.71 x10E6/uL — ABNORMAL LOW (ref 3.77–5.28)
RDW: 13.9 % (ref 12.3–15.4)
WBC: 5.9 10*3/uL (ref 3.4–10.8)

## 2017-12-22 LAB — COMPREHENSIVE METABOLIC PANEL
A/G RATIO: 1.6 (ref 1.2–2.2)
ALBUMIN: 4.7 g/dL (ref 3.6–4.8)
ALK PHOS: 58 IU/L (ref 39–117)
ALT: 16 IU/L (ref 0–32)
AST: 15 IU/L (ref 0–40)
BILIRUBIN TOTAL: 0.6 mg/dL (ref 0.0–1.2)
BUN / CREAT RATIO: 13 (ref 12–28)
BUN: 13 mg/dL (ref 8–27)
CO2: 21 mmol/L (ref 20–29)
CREATININE: 0.98 mg/dL (ref 0.57–1.00)
Calcium: 10.5 mg/dL — ABNORMAL HIGH (ref 8.7–10.3)
Chloride: 105 mmol/L (ref 96–106)
GFR calc Af Amer: 69 mL/min/{1.73_m2} (ref >59)
GFR calc non Af Amer: 60 mL/min/{1.73_m2} (ref >59)
GLOBULIN, TOTAL: 3 g/dL (ref 1.5–4.5)
Glucose: 88 mg/dL (ref 65–99)
Potassium: 3.8 mmol/L (ref 3.5–5.2)
SODIUM: 141 mmol/L (ref 134–144)
Total Protein: 7.7 g/dL (ref 6.0–8.5)

## 2017-12-22 LAB — FERRITIN: Ferritin: 222 ng/mL — ABNORMAL HIGH (ref 15–150)

## 2017-12-22 LAB — IRON AND TIBC
Iron Saturation: 35 % (ref 15–55)
Iron: 79 ug/dL (ref 27–139)
TIBC: 226 ug/dL — AB (ref 250–450)
UIBC: 147 ug/dL (ref 118–369)

## 2017-12-22 LAB — VITAMIN B12: Vitamin B-12: 1094 pg/mL (ref 232–1245)

## 2017-12-22 LAB — SPECIMEN STATUS REPORT

## 2017-12-22 IMAGING — CT CT ABD-PEL WO/W CM
2 of 12 series · 13 of 46 positions shown, 15 images · IV contrast (Isovue)
Comparison: CT [facility] [DATE]

CLINICAL DATA: Epigastric pain. Weight loss and loss of appetite.
Assess for occult malignancy.

EXAM:
CT ABDOMEN AND PELVIS WITHOUT AND WITH CONTRAST
TECHNIQUE: Multidetector CT imaging of the abdomen and pelvis was performed
following the standard protocol before and following the bolus
administration of intravenous contrast.
CONTRAST:  100mL [RE] IOPAMIDOL ([RE]) INJECTION 61%

[Series 7: coronal arterial · coronal · arterial · 0.45mm/px · 3 of 83 slices shown]
[im 21/83  soft-tissue]
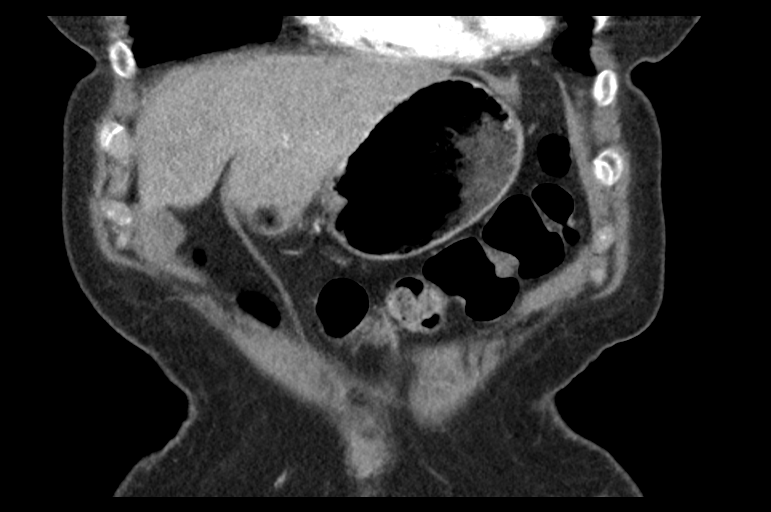
[im 42/83  soft-tissue]
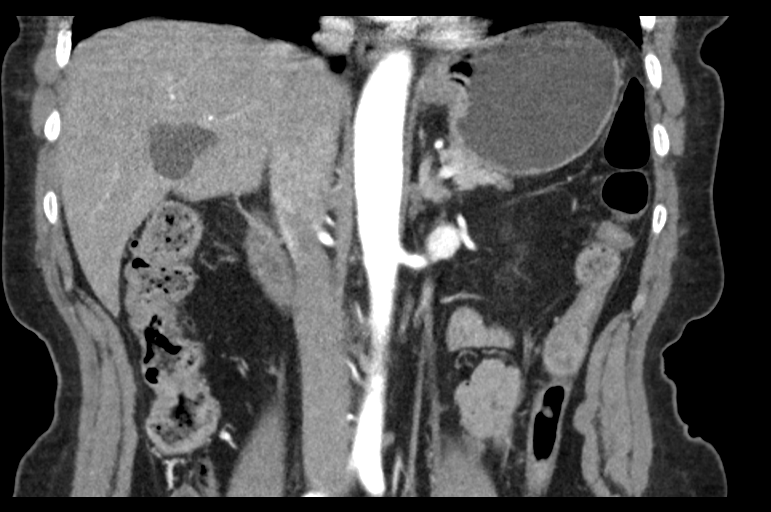
[im 62/83  soft-tissue]
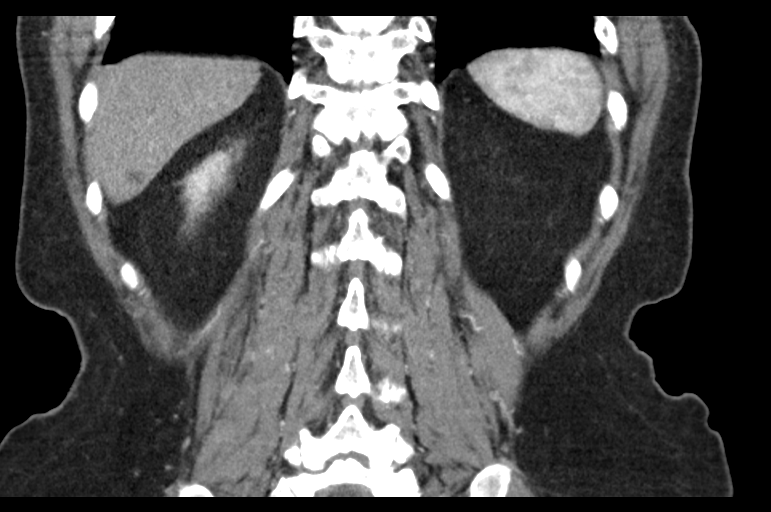

[Series 11: portal thin · axial · portal-venous · 0.68mm/px · z∈[+1048,+1376]mm · 10 of 202 slices shown, 12 images]
[im 19/202  soft-tissue]
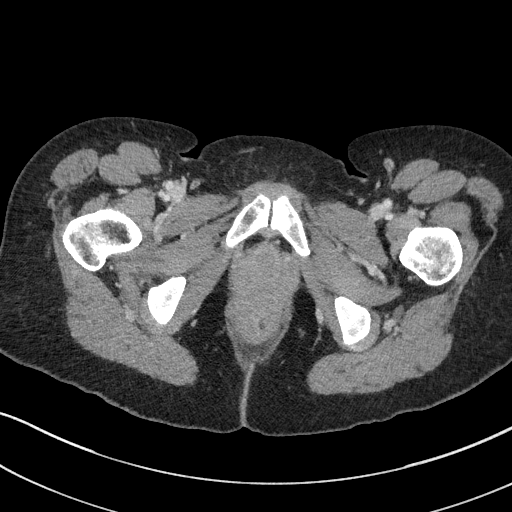
[im 19/202  bone]
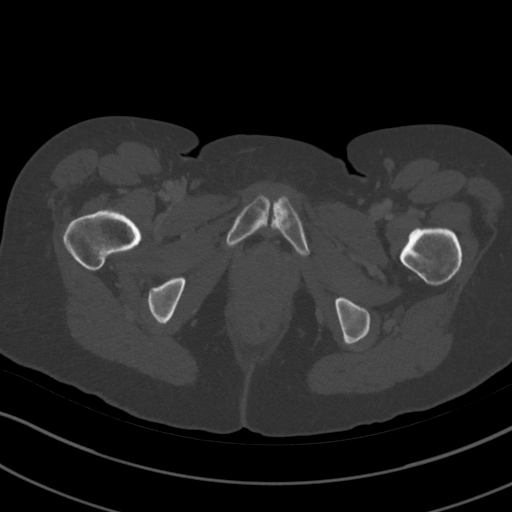
[im 37/202  soft-tissue]
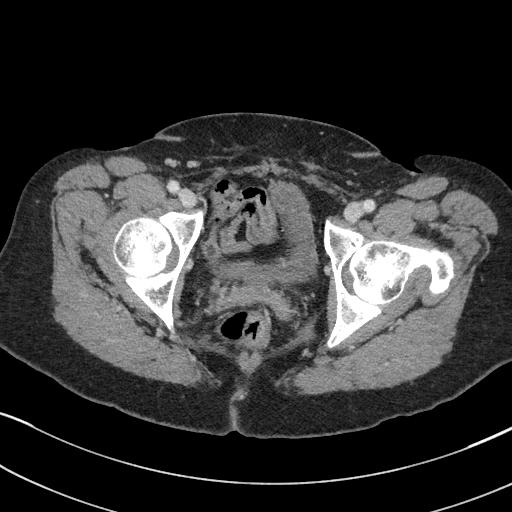
[im 55/202  soft-tissue]
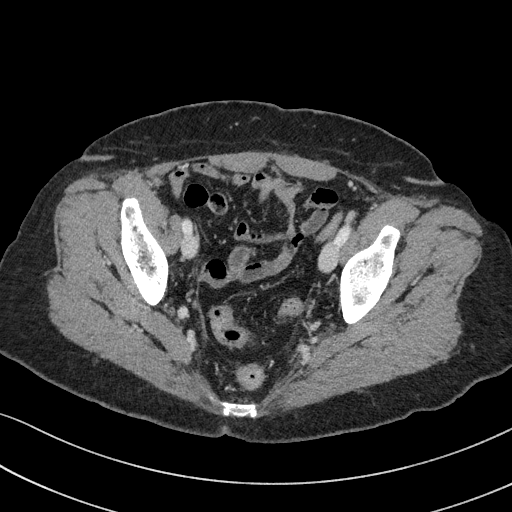
[im 74/202  soft-tissue]
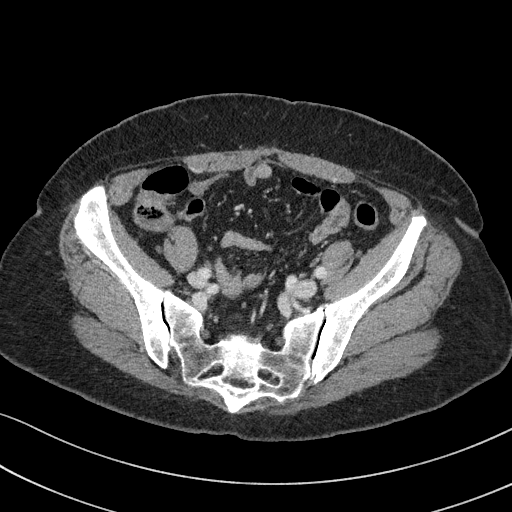
[im 92/202  soft-tissue]
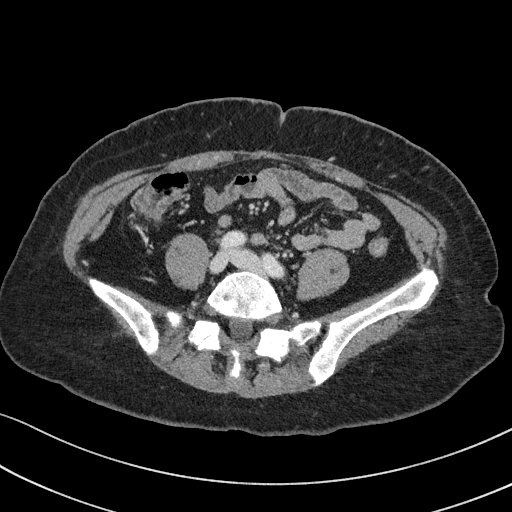
[im 110/202  soft-tissue]
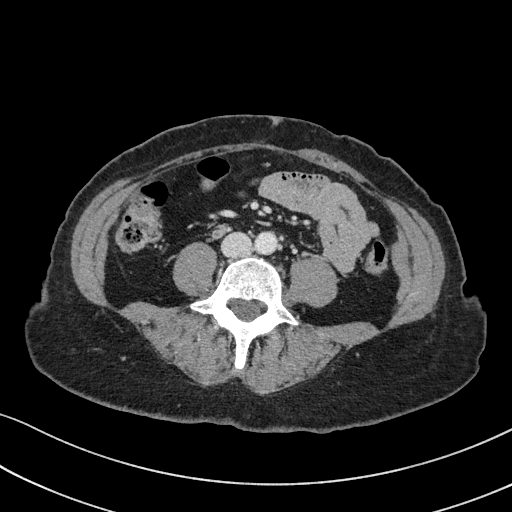
[im 128/202  soft-tissue]
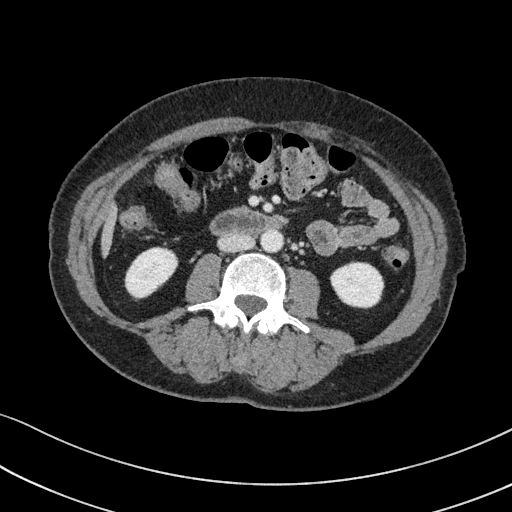
[im 147/202  soft-tissue]
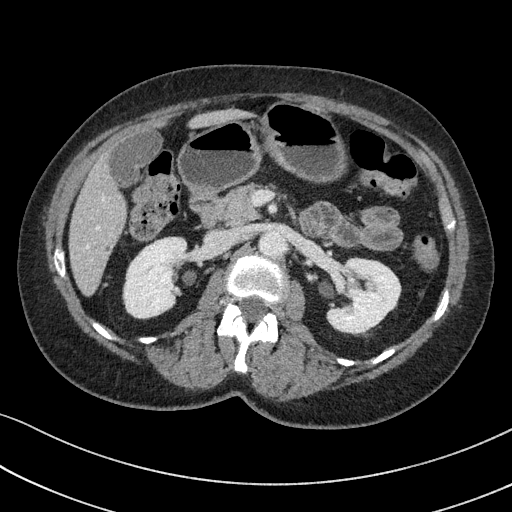
[im 165/202  soft-tissue]
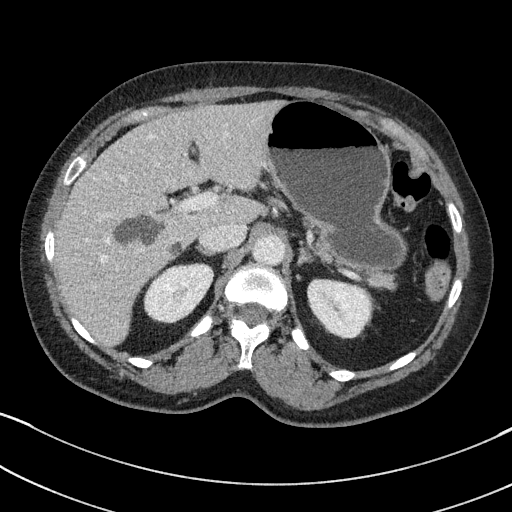
[im 165/202  bone]
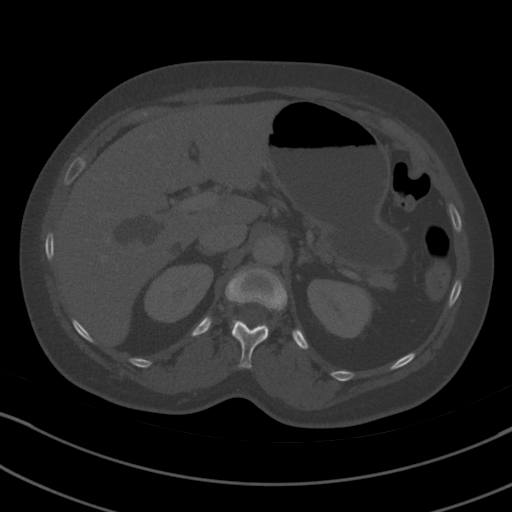
[im 183/202  soft-tissue]
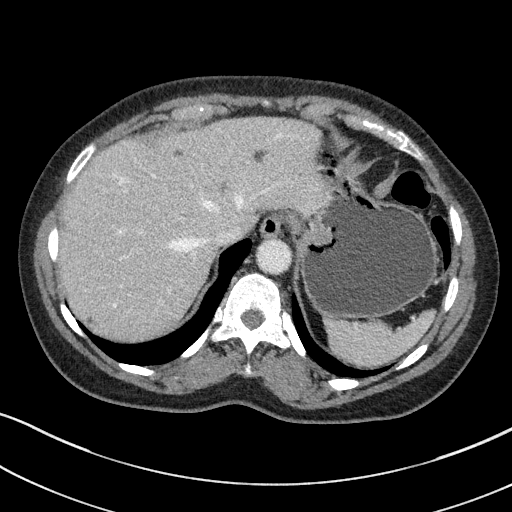

[13 of 46 positions shown; findings below may reference images not displayed]

FINDINGS: Lower chest: No acute abnormality.

Hepatobiliary: There are scattered low-attenuation foci within both
lobes of liver. Similar to previous exam likely representing simple
cysts. No suspicious liver abnormality identified. The gallbladder
appears within normal limits.There is no biliary dilatation.

Pancreas: Normal appearance of the pancreas. No main duct
dilatation, inflammation or mass.

Spleen: Normal in size without focal abnormality.

Adrenals/Urinary Tract: The adrenal glands appear normal. Small
low-attenuation foci within the left kidney are similar to previous
exam. These are too small to reliably characterize. No kidney mass
or hydronephrosis identified. The urinary bladder appears normal.

Stomach/Bowel: The stomach and small bowel loops have a normal
course and caliber. There is no bowel obstruction. The appendix is
visualized and appears normal. Unremarkable appearance of the colon.

Vascular/Lymphatic: Normal appearance of the abdominal aorta. No
enlarged upper abdominal lymph nodes. No pelvic or inguinal
adenopathy identified.

Reproductive: Status post hysterectomy. No adnexal masses.

Other: No ascites or focal fluid collections identified within the
abdomen or pelvis.

Musculoskeletal: No acute or significant osseous findings.
IMPRESSION: 1. No mass or adenopathy identified within the abdomen or pelvis.
2. No evidence for pancreatic neoplasm.
3. Liver cysts.

## 2017-12-22 MED ORDER — IOPAMIDOL (ISOVUE-300) INJECTION 61%
100.0000 mL | Freq: Once | INTRAVENOUS | Status: AC | PRN
  Administered 2017-12-22: 100 mL via INTRAVENOUS

## 2017-12-25 NOTE — Progress Notes (Signed)
Pt aware and OK to schedule the US since she is still having pain when she eats.

## 2017-12-25 NOTE — Progress Notes (Signed)
Hgb down from 4 months ago. Other labs ordered at time of visit, as they were due for Hematology as well. From a GI standpoint, LFTs normal, no reason for abdominal pain noted on labs. GretchenLorain Childes: FYI on labs that she had upcoming.

## 2017-12-25 NOTE — Progress Notes (Signed)
Pt is aware.  

## 2017-12-25 NOTE — Progress Notes (Signed)
Please let patient know that her CT was negative for acute findings. If still having pain with eating, needs ultrasound of abdomen to assess for gallstones. I will address labs separately. I will need to review CT with radiology as well regarding mesenteric vasculature in light of her weight loss and abdominal pain.

## 2017-12-26 ENCOUNTER — Other Ambulatory Visit: Payer: Self-pay | Admitting: *Deleted

## 2017-12-26 DIAGNOSIS — R109 Unspecified abdominal pain: Secondary | ICD-10-CM

## 2017-12-29 ENCOUNTER — Ambulatory Visit (HOSPITAL_COMMUNITY)
Admission: RE | Admit: 2017-12-29 | Discharge: 2017-12-29 | Disposition: A | Discharge status: Home / Self Care | Payer: Medicare Other | Source: Ambulatory Visit | Attending: Gastroenterology | Admitting: Gastroenterology

## 2017-12-29 DIAGNOSIS — N281 Cyst of kidney, acquired: Secondary | ICD-10-CM | POA: Insufficient documentation

## 2017-12-29 DIAGNOSIS — R109 Unspecified abdominal pain: Secondary | ICD-10-CM

## 2017-12-29 DIAGNOSIS — K7689 Other specified diseases of liver: Secondary | ICD-10-CM | POA: Diagnosis not present

## 2018-01-01 ENCOUNTER — Ambulatory Visit (HOSPITAL_COMMUNITY): Payer: Medicare Other

## 2018-01-02 ENCOUNTER — Telehealth: Payer: Self-pay | Admitting: Gastroenterology

## 2018-01-02 MED ORDER — LIDOCAINE VISCOUS 2 % MT SOLN: 15.0000 mL | Freq: Four times a day (QID) | OROMUCOSAL | 0 refills | Status: DC | PRN

## 2018-01-02 NOTE — Telephone Encounter (Signed)
Pt was seen recently on 2/21 and is still having problems with her stomach burning with no relief. Is there anything we can recommend for her? I offered for her to come in tomorrow to see SF, but she didn't want to. She would rather the nurse call with recommendations. [phone]

## 2018-01-02 NOTE — Telephone Encounter (Signed)
Have her avoid spicy foods (such as sausage) and high fat foods.   Reviewed with radiology: no aortic atherosclerosis, celiac and SMA widely patent without any stenosis at the origin, IMA also open. Not dealing with chronic mesenteric ischemia.   US abdomen without gallstones. She has a history of gastritis with a fairly recent upper endoscopy July 2018. However, she did have drift in her Hgb from her baseline of 10 range (now upper 9s), but no overt GI bleeding.   I am sending in viscous lidocaine to take as needed for abdominal discomfort. [facility] . I don't think a HIDA would be beneficial because her pain is not postprandial and not typical of biliary etiology. We may need to entertain the idea of an upper endoscopy in light of weight loss and epigastric pain, even though she had one in July 2018. Will discuss with Dr. Darrick PennaFields.

## 2018-01-02 NOTE — Addendum Note (Signed)
Addended by: Gelene MinkBOONE, Jedadiah Abdallah W on: 01/02/2018 03:55 PM   Modules accepted: Orders

## 2018-01-02 NOTE — Telephone Encounter (Signed)
To Lewie LoronAnna Boone, NP.

## 2018-01-02 NOTE — Telephone Encounter (Signed)
PT is aware.

## 2018-01-02 NOTE — Progress Notes (Signed)
US abdomen without gallstones. Simple liver cysts. Simple kidney cyst. How is she feeling?

## 2018-01-02 NOTE — Progress Notes (Signed)
Pt is aware and said she is not having abdominal pain now. She started taking an off brand iron tablet that her aunt gave her, she is not sure of the strength. She said it started making her stools dark and she does not like that. Also, she would like to know if it will be OK to take Ensure or Boost.

## 2018-01-02 NOTE — Telephone Encounter (Signed)
Pt said she has a burning sensation in her tummy. She had a sausage, egg, and cheese mcmuffin this morning for breakfast and a yogurt with strawberries and blue berries.  She feels like she needs to have a BM, but she is just waiting. She has a BM everyday, last one yesterday and they are normal consistency.

## 2018-01-03 ENCOUNTER — Other Ambulatory Visit: Payer: Self-pay | Admitting: Family Medicine

## 2018-01-04 ENCOUNTER — Telehealth: Payer: Self-pay | Admitting: Gastroenterology

## 2018-01-04 DIAGNOSIS — Z471 Aftercare following joint replacement surgery: Secondary | ICD-10-CM | POA: Diagnosis not present

## 2018-01-04 DIAGNOSIS — Z96652 Presence of left artificial knee joint: Secondary | ICD-10-CM | POA: Insufficient documentation

## 2018-01-04 DIAGNOSIS — M1712 Unilateral primary osteoarthritis, left knee: Secondary | ICD-10-CM | POA: Diagnosis not present

## 2018-01-04 NOTE — Telephone Encounter (Signed)
[phone] PLEASE CALL PATIENT ABOUT THE PRESCRIPTION SHE IS SUPPOSED TO GARGLE FOR ABD PAIN, SHE STATES IT IS TOO THICK AND SHE CAN NOT DO IT

## 2018-01-04 NOTE — Telephone Encounter (Signed)
Lmom, waiting on a return call.  

## 2018-01-05 ENCOUNTER — Other Ambulatory Visit: Payer: Self-pay

## 2018-01-05 MED ORDER — CITALOPRAM HYDROBROMIDE 10 MG PO TABS: ORAL_TABLET | ORAL | 1 refills | Status: DC

## 2018-01-05 MED ORDER — LEVOTHYROXINE SODIUM 112 MCG PO TABS: ORAL_TABLET | ORAL | 1 refills | Status: DC

## 2018-01-05 NOTE — Telephone Encounter (Signed)
Spoke with pt. Pt said the Lidocaine 2% solution is very thick and cant be gargled. Asked pt to call the pharmacy back to make sure the solution is suppose to be as thick as she is experiencing. Pt is aware that a note will be sent to the provider as well.

## 2018-01-08 ENCOUNTER — Other Ambulatory Visit (HOSPITAL_COMMUNITY): Payer: Self-pay

## 2018-01-08 DIAGNOSIS — D649 Anemia, unspecified: Secondary | ICD-10-CM

## 2018-01-08 DIAGNOSIS — E890 Postprocedural hypothyroidism: Secondary | ICD-10-CM

## 2018-01-08 NOTE — Telephone Encounter (Signed)
Reviewed with Dr. Darrick PennaFields. She has addended my last office note. Does not need an additional EGD. Has non-ulcer dyspepsia. May see mental health for anxiety. Viscous lidocaine prn for epigastric pain. Keep appt for May 2019 upcoming.

## 2018-01-08 NOTE — Telephone Encounter (Signed)
Pt.notified

## 2018-01-09 ENCOUNTER — Inpatient Hospital Stay (HOSPITAL_COMMUNITY): Payer: Medicare Other | Attending: Internal Medicine

## 2018-01-09 DIAGNOSIS — R109 Unspecified abdominal pain: Secondary | ICD-10-CM | POA: Diagnosis not present

## 2018-01-09 DIAGNOSIS — I10 Essential (primary) hypertension: Secondary | ICD-10-CM | POA: Diagnosis not present

## 2018-01-09 DIAGNOSIS — M8580 Other specified disorders of bone density and structure, unspecified site: Secondary | ICD-10-CM | POA: Diagnosis not present

## 2018-01-09 DIAGNOSIS — F418 Other specified anxiety disorders: Secondary | ICD-10-CM | POA: Diagnosis not present

## 2018-01-09 DIAGNOSIS — K5900 Constipation, unspecified: Secondary | ICD-10-CM | POA: Diagnosis not present

## 2018-01-09 DIAGNOSIS — Z87891 Personal history of nicotine dependence: Secondary | ICD-10-CM | POA: Insufficient documentation

## 2018-01-09 DIAGNOSIS — M1990 Unspecified osteoarthritis, unspecified site: Secondary | ICD-10-CM | POA: Diagnosis not present

## 2018-01-09 DIAGNOSIS — Z7982 Long term (current) use of aspirin: Secondary | ICD-10-CM | POA: Diagnosis not present

## 2018-01-09 DIAGNOSIS — Z79899 Other long term (current) drug therapy: Secondary | ICD-10-CM | POA: Insufficient documentation

## 2018-01-09 DIAGNOSIS — K219 Gastro-esophageal reflux disease without esophagitis: Secondary | ICD-10-CM | POA: Insufficient documentation

## 2018-01-09 DIAGNOSIS — E785 Hyperlipidemia, unspecified: Secondary | ICD-10-CM | POA: Insufficient documentation

## 2018-01-09 DIAGNOSIS — D649 Anemia, unspecified: Secondary | ICD-10-CM | POA: Diagnosis not present

## 2018-01-09 DIAGNOSIS — E890 Postprocedural hypothyroidism: Secondary | ICD-10-CM | POA: Insufficient documentation

## 2018-01-09 LAB — CBC WITH DIFFERENTIAL/PLATELET
BASOS ABS: 0 10*3/uL (ref 0.0–0.1)
BASOS PCT: 1 %
EOS ABS: 0.2 10*3/uL (ref 0.0–0.7)
Eosinophils Relative: 5 %
HCT: 32.1 % — ABNORMAL LOW (ref 36.0–46.0)
HEMOGLOBIN: 10 g/dL — AB (ref 12.0–15.0)
LYMPHS ABS: 1.6 10*3/uL (ref 0.7–4.0)
Lymphocytes Relative: 38 %
MCH: 27.5 pg (ref 26.0–34.0)
MCHC: 31.2 g/dL (ref 30.0–36.0)
MCV: 88.4 fL (ref 78.0–100.0)
Monocytes Absolute: 0.2 10*3/uL (ref 0.1–1.0)
Monocytes Relative: 5 %
NEUTROS PCT: 51 %
Neutro Abs: 2.1 10*3/uL (ref 1.7–7.7)
PLATELETS: 273 10*3/uL (ref 150–400)
RBC: 3.63 MIL/uL — AB (ref 3.87–5.11)
RDW: 13.1 % (ref 11.5–15.5)
WBC: 4.1 10*3/uL (ref 4.0–10.5)

## 2018-01-09 LAB — COMPREHENSIVE METABOLIC PANEL
ALBUMIN: 3.9 g/dL (ref 3.5–5.0)
ALK PHOS: 85 U/L (ref 38–126)
ALT: 18 U/L (ref 14–54)
AST: 20 U/L (ref 15–41)
Anion gap: 10 (ref 5–15)
BUN: 11 mg/dL (ref 6–20)
CALCIUM: 9.7 mg/dL (ref 8.9–10.3)
CHLORIDE: 101 mmol/L (ref 101–111)
CO2: 29 mmol/L (ref 22–32)
CREATININE: 0.95 mg/dL (ref 0.44–1.00)
GFR calc Af Amer: >60 mL/min (ref >60)
GFR calc non Af Amer: >60 mL/min (ref >60)
GLUCOSE: 106 mg/dL — AB (ref 65–99)
Potassium: 3.7 mmol/L (ref 3.5–5.1)
SODIUM: 140 mmol/L (ref 135–145)
Total Bilirubin: 0.8 mg/dL (ref 0.3–1.2)
Total Protein: 7.1 g/dL (ref 6.5–8.1)

## 2018-01-09 LAB — FERRITIN: FERRITIN: 91 ng/mL (ref 11–307)

## 2018-01-09 LAB — IRON AND TIBC
Iron: 60 ug/dL (ref 28–170)
Saturation Ratios: 26 % (ref 10.4–31.8)
TIBC: 235 ug/dL — ABNORMAL LOW (ref 250–450)
UIBC: 175 ug/dL

## 2018-01-09 LAB — VITAMIN B12: Vitamin B-12: 806 pg/mL (ref 180–914)

## 2018-01-09 NOTE — Progress Notes (Signed)
Pt is aware.  

## 2018-01-09 NOTE — Progress Notes (Signed)
Laura [name]: she may take Ensure or Boost between meals.

## 2018-01-09 NOTE — Progress Notes (Signed)
LMOM to call.

## 2018-01-16 ENCOUNTER — Inpatient Hospital Stay (HOSPITAL_BASED_OUTPATIENT_CLINIC_OR_DEPARTMENT_OTHER): Payer: Medicare Other | Admitting: Internal Medicine

## 2018-01-16 ENCOUNTER — Encounter (HOSPITAL_COMMUNITY): Payer: Self-pay | Admitting: Internal Medicine

## 2018-01-16 ENCOUNTER — Inpatient Hospital Stay (HOSPITAL_COMMUNITY): Payer: Medicare Other

## 2018-01-16 ENCOUNTER — Other Ambulatory Visit: Payer: Self-pay

## 2018-01-16 VITALS — BP 135/56 | HR 69 | Temp 97.7°F | Resp 20 | Wt 160.7 lb

## 2018-01-16 DIAGNOSIS — K5900 Constipation, unspecified: Secondary | ICD-10-CM | POA: Diagnosis not present

## 2018-01-16 DIAGNOSIS — F418 Other specified anxiety disorders: Secondary | ICD-10-CM | POA: Diagnosis not present

## 2018-01-16 DIAGNOSIS — Z79899 Other long term (current) drug therapy: Secondary | ICD-10-CM

## 2018-01-16 DIAGNOSIS — Z87891 Personal history of nicotine dependence: Secondary | ICD-10-CM

## 2018-01-16 DIAGNOSIS — K219 Gastro-esophageal reflux disease without esophagitis: Secondary | ICD-10-CM | POA: Diagnosis not present

## 2018-01-16 DIAGNOSIS — Z7982 Long term (current) use of aspirin: Secondary | ICD-10-CM | POA: Diagnosis not present

## 2018-01-16 DIAGNOSIS — E890 Postprocedural hypothyroidism: Secondary | ICD-10-CM

## 2018-01-16 DIAGNOSIS — R109 Unspecified abdominal pain: Secondary | ICD-10-CM | POA: Diagnosis not present

## 2018-01-16 DIAGNOSIS — E785 Hyperlipidemia, unspecified: Secondary | ICD-10-CM | POA: Diagnosis not present

## 2018-01-16 DIAGNOSIS — M8580 Other specified disorders of bone density and structure, unspecified site: Secondary | ICD-10-CM

## 2018-01-16 DIAGNOSIS — M1990 Unspecified osteoarthritis, unspecified site: Secondary | ICD-10-CM | POA: Diagnosis not present

## 2018-01-16 DIAGNOSIS — I10 Essential (primary) hypertension: Secondary | ICD-10-CM | POA: Diagnosis not present

## 2018-01-16 DIAGNOSIS — D649 Anemia, unspecified: Secondary | ICD-10-CM

## 2018-01-16 NOTE — Patient Instructions (Addendum)
[facility] at [facility] Discharge Instructions  You were seen today by Dr. Melton AlarHiggs. She went over your lab results and everything looks good. She discussed your low energy and how you've been feeling. She discussed your fatigue and ways to help with that. She discussed some blood work we could do today to help figure out what's going on. We will get labs today and we will call you with those results. Have your PCP follow up with you on your Hemoglobin levels and get them to send you back here if they start dropping again.    Thank you for choosing [facility] at [facility] to provide your oncology and hematology care.  To afford each patient quality time with our provider, please arrive at least 15 minutes before your scheduled appointment time.   If you have a lab appointment with the Cancer Center please come in thru the  Main Entrance and check in at the main information desk  You need to re-schedule your appointment should you arrive 10 or more minutes late.  We strive to give you quality time with our providers, and arriving late affects you and other patients whose appointments are after yours.  Also, if you no show three or more times for appointments you may be dismissed from the clinic at the providers discretion.     Again, thank you for choosing [facility].  Our hope is that these requests will decrease the amount of time that you wait before being seen by our physicians.       _____________________________________________________________  Should you have questions after your visit to [facility], please contact our office at [phone] between the hours of 8:30 a.m. and 4:30 p.m.  Voicemails left after 4:30 p.m. will not be returned until the following business day.  For prescription refill requests, have your pharmacy contact our office.       Resources For Cancer Patients and their  Caregivers ? American Cancer Society: Can assist with transportation, wigs, general needs, runs Look Good Feel Better.        [phone] ? Cancer Care: Provides financial assistance, online support groups, medication/co-pay assistance.  1-800-813-HOPE 480-538-8218(4673) ? [facility] Assists [place] cancer patients and their families through emotional , educational and financial support.  [phone] ? [place] DSS Where to apply for food stamps, Medicaid and utility assistance. (307)240-0233717-421-7464 ? [facility]: Transportation to medical appointments. 251-839-6206620-650-3466 ? Social Security Administration: May apply for disability if have a Stage IV cancer. [phone] ? [place] [facility], Disability and Transit Services: Assists with nutrition, care and transit needs. [phone]  Cancer Center Support Programs:   > Cancer Support Group  2nd Tuesday of the month 1pm-2pm, Journey Room   > Creative Journey  3rd Tuesday of the month 1130am-1pm, Journey Room

## 2018-01-17 LAB — PROTEIN ELECTROPHORESIS, SERUM
A/G RATIO SPE: 1.1 (ref 0.7–1.7)
ALBUMIN ELP: 3.8 g/dL (ref 2.9–4.4)
ALPHA-2-GLOBULIN: 0.7 g/dL (ref 0.4–1.0)
Alpha-1-Globulin: 0.2 g/dL (ref 0.0–0.4)
BETA GLOBULIN: 1.1 g/dL (ref 0.7–1.3)
GAMMA GLOBULIN: 1.6 g/dL (ref 0.4–1.8)
Globulin, Total: 3.6 g/dL (ref 2.2–3.9)
Total Protein ELP: 7.4 g/dL (ref 6.0–8.5)

## 2018-01-30 ENCOUNTER — Telehealth: Payer: Self-pay | Admitting: Family Medicine

## 2018-01-30 ENCOUNTER — Other Ambulatory Visit: Payer: Self-pay

## 2018-01-30 MED ORDER — LEVOTHYROXINE SODIUM 112 MCG PO TABS: ORAL_TABLET | ORAL | 5 refills | Status: DC

## 2018-01-30 NOTE — Telephone Encounter (Signed)
Done

## 2018-01-30 NOTE — Telephone Encounter (Signed)
Pt stopped by to see if you could call her in Levothyroxine 0.112mg  to [facility] for just a 30 day supply. If any trouble please call her.

## 2018-02-13 ENCOUNTER — Other Ambulatory Visit: Payer: Self-pay

## 2018-02-13 ENCOUNTER — Encounter: Payer: Self-pay | Admitting: Family Medicine

## 2018-02-13 ENCOUNTER — Ambulatory Visit (INDEPENDENT_AMBULATORY_CARE_PROVIDER_SITE_OTHER): Payer: Medicare Other | Admitting: Family Medicine

## 2018-02-13 VITALS — BP 128/60 | HR 70 | Temp 98.5°F | Resp 18 | Ht 64.0 in | Wt 164.1 lb

## 2018-02-13 DIAGNOSIS — L247 Irritant contact dermatitis due to plants, except food: Secondary | ICD-10-CM

## 2018-02-13 MED ORDER — PREDNISONE 10 MG (21) PO TBPK: ORAL_TABLET | ORAL | 0 refills | Status: DC

## 2018-02-13 MED ORDER — HYDROXYZINE HCL 25 MG PO TABS: 25.0000 mg | ORAL_TABLET | Freq: Three times a day (TID) | ORAL | 0 refills | Status: DC | PRN

## 2018-02-13 NOTE — Patient Instructions (Signed)
Take the prednisone pak Take all of day one today  Take the hydroxyzine for itching May take one or two as needed for itch  Call if not better in a couple days

## 2018-02-13 NOTE — Progress Notes (Signed)
Chief Complaint  Patient presents with  . Poison Oak  Patient did yard work and worked around [employer] and wood.  Couple days later she broke out in a rash.  She is very allergic to poison ivy and poison oak.  The rash continues, it is highly pruritic.  She has been scratching.  She scratched open some of the areas.  No pain redness or fever.  No problems swallowing or breathing. She has been using an over-the-counter cream.  It "burns" but does not help.  She is using over-the-counter loratadine.  This also is not controlling the itching.   Patient Active Problem List   Diagnosis Date Noted  . Post-menopausal 12/11/2017  . Hypokalemia 09/20/2017  . Loss of weight 09/20/2017  . OA (osteoarthritis) of knee 08/09/2017  . Anemia 07/07/2017  . Epigastric pain 03/02/2017  . Maxillary sinusitis, acute 03/02/2017  . Constipation 02/05/2016  . Allergic rhinitis 10/08/2014  . GAD (generalized anxiety disorder) 09/23/2014  . POSTSURGICAL HYPOTHYROIDISM 06/22/2010  . Abdominal pain, epigastric 12/15/2009  . Normocytic anemia 08/04/2009  . GERD 04/20/2009  . Hyperlipidemia LDL goal <100 05/16/2008  . Essential hypertension 05/16/2008  . OSTEOPENIA 05/16/2008  . Urinary incontinence 05/16/2008    Outpatient Encounter Medications as of 02/13/2018  Medication Sig  . acetaminophen (TYLENOL) 500 MG tablet Take 1,000 mg by mouth every 8 (eight) hours as needed (for pain.).   Marland Kitchen aspirin EC 81 MG tablet Take 81 mg by mouth daily.  Marland Kitchen atorvastatin (LIPITOR) 20 MG tablet TAKE 1 TABLET BY MOUTH  DAILY  . citalopram (CELEXA) 10 MG tablet TAKE 1 TABLET(10 MG) BY MOUTH DAILY  . gabapentin (NEURONTIN) 100 MG capsule Take 1 capsule (100 mg total) by mouth at bedtime. (Patient taking differently: Take 100 mg by mouth daily as needed (for restless legs). )  . hydrochlorothiazide (HYDRODIURIL) 25 MG tablet TAKE 1 TABLET BY MOUTH  DAILY  . levothyroxine (SYNTHROID, LEVOTHROID) 112 MCG tablet TAKE 1 TABLET(112  MCG) BY MOUTH DAILY BEFORE BREAKFAST  . lidocaine (XYLOCAINE) 2 % solution Use as directed 15 mLs in the mouth or throat every 6 (six) hours as needed (abdominal pain).  Marland Kitchen linaclotide (LINZESS) 72 MCG capsule Take 1 capsule (72 mcg total) by mouth daily before breakfast.  . loratadine (CLARITIN) 10 MG tablet Take 1 tablet (10 mg total) by mouth daily. (Patient taking differently: Take 10 mg by mouth daily as needed for allergies. )  . losartan (COZAAR) 50 MG tablet TAKE 1 TABLET BY MOUTH  DAILY  . megestrol (MEGACE) 40 MG tablet Take 1 tablet (40 mg total) by mouth daily.  . methocarbamol (ROBAXIN) 500 MG tablet Take 1 tablet (500 mg total) by mouth every 6 (six) hours as needed for muscle spasms.  . pantoprazole (PROTONIX) 40 MG tablet TAKE 1 TABLET BY MOUTH TWO  TIMES DAILY  . Polyethyl Glycol-Propyl Glycol (SYSTANE) 0.4-0.3 % SOLN Apply 1 drop to eye 3 (three) times daily as needed (for dry eyes.).  Marland Kitchen potassium chloride (K-DUR) 10 MEQ tablet Take 1 tablet (10 mEq total) by mouth 2 (two) times daily.  . traMADol (ULTRAM) 50 MG tablet Take 50 mg by mouth daily as needed.  . hydrOXYzine (ATARAX/VISTARIL) 25 MG tablet Take 1 tablet (25 mg total) by mouth 3 (three) times daily as needed. At bed may take 2  . predniSONE (STERAPRED UNI-PAK 21 TAB) 10 MG (21) TBPK tablet Tad   No facility-administered encounter medications on file as of 02/13/2018.  Allergies  Allergen Reactions  . Dilaudid [Hydromorphone Hcl] Other (See Comments)    Confusion;altered state of mind  . Levaquin [Levofloxacin] Other (See Comments)    Dry throat, no raste  . Nsaids     REACTION: per GI pt no longer to use NSAIDS  . Penicillins Nausea Only  . Promethazine     Legs itching, RESTLESS LEGS    Review of Systems  HENT: Positive for trouble swallowing and voice change.   Respiratory: Positive for shortness of breath and wheezing.   Skin: Positive for rash.  All other systems reviewed and are  negative.   Physical Exam  Constitutional: She appears well-developed and well-nourished. She appears distressed.  Animated, jittery, trying not to scratch skin  HENT:  Head: Normocephalic and atraumatic.  Mouth/Throat: Oropharynx is clear and moist.  Eyes: Pupils are equal, round, and reactive to light. Conjunctivae are normal.  Neck: Normal range of motion.  Cardiovascular: Normal rate, regular rhythm and normal heart sounds.    Pulmonary/Chest: Effort normal and breath sounds normal.  Lymphadenopathy:    She has no cervical adenopathy.  Skin: Rash noted.  Psychiatric: She has a normal mood and affect. Her behavior is normal.    BP 128/60   Pulse 70   Temp 98.5 F (36.9 C) (Oral)   Resp 18   Ht 5\' 4"  (1.626 m)   Wt 164 lb 1.9 oz (74.4 kg)   SpO2 98%   BMI 28.17 kg/m   @PHYSEXAM @  ASSESSMENT/PLAN:  1. Irritant contact dermatitis due to plants, except food #1 do not scratch #2 antihistamines for itching.  Can use loratadine during the day and Atarax at night.  Cautioned drowsiness. #3 take prednisone as directed.  This is a Dosepak.  Take all of day 1 today.  Cautioned against high doses causing jitteriness and flushing. Call if not improving in 48 hours   Patient Instructions  Take the prednisone pak Take all of day one today  Take the hydroxyzine for itching May take one or two as needed for itch  Call if not better in a couple days    Eustace MooreYvonne Sue Shelby Anderle, MD

## 2018-02-14 NOTE — Progress Notes (Signed)
Diagnosis Anemia, unspecified type - Plan: Protein electrophoresis, serum  Staging Cancer Staging No matching staging information was found for the patient.  Assessment and Plan:  1.  Anemia.  Pt was previously seen by Dr. Janyth Contes.  Workup as detailed below has been negative.  She has a normal SPEP.  HB is 10 which is stable.   She can RTC in 6 months for repeat labs or prn and RTC if HB decreases on follow-up labs.    2.  HTN.  BP is 135/56.  Follow-up with PCP.   3.  Abdominal pain.  Pt had CT abdomen and pelvis done that showed no adenopathy.  Follow-up with PCP or GI as recommended.    Interval history:  67 y.o. female seen initially by Dr. Janyth Contes  for evaluation of anemia. Patient has a history of chronic anemia, with hemoglobin in the 10-11 g/dL range, dating as far back as 2009. Patient has been following with Dr. Darrick Penna and has a full GI workup including capsule endoscopy and EGD/colonoscopy without any evidence of bleeding. The only GI finding was mild gastritis. Patient denies any melena, hematochezia, hematuria. Overall she states she feels well she denies any fatigue. She denies any chest pain, shortness breath, abdominal pain, headaches, blurry vision, focal weakness. Her most recent bloodwork from 05/17/17 demonstrated WBC 4.2K, hematoma 10.4 g/dL, hematocrit 16.1%, MCV 84.9, platelet count 306K. Ferritin 102, folate 360, and B12 1393. She denies any history of any hemoglobinopathies in her family to her knowledge.  Labs from 07/07/2017 demonstrated normal iron studies with a ferritin of 105,erythropoietin within normal limits, normal folate. Vitamin B12 elevated at 1162. Reticulocyte count within normal limits. Hemoglobin electrophoresis demonstrate normal adult hemoglobin. Immunofixation was normal.  Current Status:  Pt is seen today for follow-up to go over labs.    Problem List Patient Active Problem List   Diagnosis Date Noted  . Post-menopausal [Z78.0] 12/11/2017  .  Hypokalemia [E87.6] 09/20/2017  . Loss of weight [R63.4] 09/20/2017  . OA (osteoarthritis) of knee [M17.10] 08/09/2017  . Anemia [D64.9] 07/07/2017  . Epigastric pain [R10.13] 03/02/2017  . Maxillary sinusitis, acute [J01.00] 03/02/2017  . Constipation [K59.00] 02/05/2016  . Allergic rhinitis [J30.9] 10/08/2014  . GAD (generalized anxiety disorder) [F41.1] 09/23/2014  . POSTSURGICAL HYPOTHYROIDISM [E89.0] 06/22/2010  . Abdominal pain, epigastric [R10.13] 12/15/2009  . Normocytic anemia [D64.9] 08/04/2009  . GERD [K21.9] 04/20/2009  . Hyperlipidemia LDL goal <100 [E78.5] 05/16/2008  . Essential hypertension [I10] 05/16/2008  . OSTEOPENIA [M89.9, M94.9] 05/16/2008  . Urinary incontinence [R32] 05/16/2008    Past Medical History Past Medical History:  Diagnosis Date  . Anxiety   . Cystitis   . Depression   . Gastritis   . GERD (gastroesophageal reflux disease)   . Hashimoto's thyroiditis    Hx   . Hyperlipidemia   . Hypertension   . Hypothyroidism   . Normocytic anemia 2009 Hb 9.9-11.1  . Osteopenia   . Shoulder pain   . Urinary incontinence     Past Surgical History Past Surgical History:  Procedure Laterality Date  . Bilateral tubal ligation    . BREAST EXCISIONAL BIOPSY  2010   Excisional biopsy of benign left breast mass -lopoma   . BREAST SURGERY     left nreast biopsy  . COLONOSCOPY  08/26/2011   SLF: 1. Internal hemorrhoids  . COLONOSCOPY N/A 10/06/2015   [facility]:EAVW fissure or internal hemorrhoids/mild sigmoid colitis. benign colonic path   . colonscopy  2005   Dr. Katrinka Blazing  .  ESOPHAGOGASTRODUODENOSCOPY  12/2009   chronic gastritis  . ESOPHAGOGASTRODUODENOSCOPY N/A 05/17/2017   Dr. Darrick Penna; gastritis, normal small bowel biopsies, multiple gastric polyps. fundic gland polyps  . GIVENS CAPSULE STUDY N/A 05/30/2017   gastritis, no source for anemia identified  . left knee surgery Left March 1,2017  . PARTIAL KNEE ARTHROPLASTY Left 08/09/2017   Procedure: LEFT  UNICOMPARTMENTAL KNEE;  Surgeon: Ollen Gross, MD;  Location: [facility];  Service: Orthopedics;  Laterality: Left;  . Resection of left lobe of thyroid    . right carpal tunnel release    . rt. knee athroscopy  2004  . SAVORY DILATION N/A 05/17/2017   Procedure: SAVORY DILATION;  Surgeon: West Bali, MD;  Location: [facility];  Service: Endoscopy;  Laterality: N/A;  . TOTAL ABDOMINAL HYSTERECTOMY  1994  . UMBILICAL HERNIA REPAIR    . Urethral dilation for stenosis  2009    Family History Family History  Problem Relation Age of Onset  . Pneumonia Mother   . Colon cancer Neg Hx   . Anesthesia problems Neg Hx   . Hypotension Neg Hx   . Malignant hyperthermia Neg Hx   . Pseudochol deficiency Neg Hx      Social History  reports that she quit smoking about 45 years ago. Her smoking use included cigarettes. She has a 0.25 pack-year smoking history. She has never used smokeless tobacco. She reports that she does not drink alcohol or use drugs.  Medications  Current Outpatient Medications:  .  acetaminophen (TYLENOL) 500 MG tablet, Take 1,000 mg by mouth every 8 (eight) hours as needed (for pain.). , Disp: , Rfl:  .  aspirin EC 81 MG tablet, Take 81 mg by mouth daily., Disp: , Rfl:  .  atorvastatin (LIPITOR) 20 MG tablet, TAKE 1 TABLET BY MOUTH  DAILY, Disp: 90 tablet, Rfl: 1 .  citalopram (CELEXA) 10 MG tablet, TAKE 1 TABLET(10 MG) BY MOUTH DAILY, Disp: 90 tablet, Rfl: 1 .  gabapentin (NEURONTIN) 100 MG capsule, Take 1 capsule (100 mg total) by mouth at bedtime. (Patient taking differently: Take 100 mg by mouth daily as needed (for restless legs). ), Disp: 30 capsule, Rfl: 4 .  hydrochlorothiazide (HYDRODIURIL) 25 MG tablet, TAKE 1 TABLET BY MOUTH  DAILY, Disp: 90 tablet, Rfl: 1 .  lidocaine (XYLOCAINE) 2 % solution, Use as directed 15 mLs in the mouth or throat every 6 (six) hours as needed (abdominal pain)., Disp: 200 mL, Rfl: 0 .  linaclotide (LINZESS) 72 MCG capsule, Take 1  capsule (72 mcg total) by mouth daily before breakfast., Disp: 90 capsule, Rfl: 3 .  loratadine (CLARITIN) 10 MG tablet, Take 1 tablet (10 mg total) by mouth daily. (Patient taking differently: Take 10 mg by mouth daily as needed for allergies. ), Disp: 30 tablet, Rfl: 3 .  losartan (COZAAR) 50 MG tablet, TAKE 1 TABLET BY MOUTH  DAILY, Disp: 90 tablet, Rfl: 1 .  megestrol (MEGACE) 40 MG tablet, Take 1 tablet (40 mg total) by mouth daily., Disp: 30 tablet, Rfl: 2 .  methocarbamol (ROBAXIN) 500 MG tablet, Take 1 tablet (500 mg total) by mouth every 6 (six) hours as needed for muscle spasms., Disp: 80 tablet, Rfl: 0 .  pantoprazole (PROTONIX) 40 MG tablet, TAKE 1 TABLET BY MOUTH TWO  TIMES DAILY, Disp: 180 tablet, Rfl: 1 .  Polyethyl Glycol-Propyl Glycol (SYSTANE) 0.4-0.3 % SOLN, Apply 1 drop to eye 3 (three) times daily as needed (for dry eyes.)., Disp: , Rfl:  .  potassium chloride (K-DUR) 10 MEQ tablet, Take 1 tablet (10 mEq total) by mouth 2 (two) times daily., Disp: 60 tablet, Rfl: 2 .  hydrOXYzine (ATARAX/VISTARIL) 25 MG tablet, Take 1 tablet (25 mg total) by mouth 3 (three) times daily as needed. At bed may take 2, Disp: 30 tablet, Rfl: 0 .  levothyroxine (SYNTHROID, LEVOTHROID) 112 MCG tablet, TAKE 1 TABLET(112 MCG) BY MOUTH DAILY BEFORE BREAKFAST, Disp: 30 tablet, Rfl: 5 .  predniSONE (STERAPRED UNI-PAK 21 TAB) 10 MG (21) TBPK tablet, Tad, Disp: 21 tablet, Rfl: 0 .  traMADol (ULTRAM) 50 MG tablet, Take 50 mg by mouth daily as needed., Disp: , Rfl:   Allergies Dilaudid [hydromorphone hcl]; Levaquin [levofloxacin]; Nsaids; Penicillins; and Promethazine  Review of Systems Review of Systems - Oncology ROS as per HPI otherwise 12 point ROS is negative.   Physical Exam  Vitals Wt Readings from Last 3 Encounters:  02/13/18 164 lb 1.9 oz (74.4 kg)  01/16/18 160 lb 11.2 oz (72.9 kg)  12/21/17 153 lb 0.6 oz (69.4 kg)   Temp Readings from Last 3 Encounters:  02/13/18 98.5 F (36.9 C) (Oral)   01/16/18 97.7 F (36.5 C) (Oral)  12/21/17 98.8 F (37.1 C) (Temporal)   BP Readings from Last 3 Encounters:  02/13/18 128/60  01/16/18 (!) 135/56  12/21/17 130/86   Pulse Readings from Last 3 Encounters:  02/13/18 70  01/16/18 69  12/21/17 80   Constitutional: Well-developed, well-nourished, and in no distress.   HENT: Head: Normocephalic and atraumatic.  Mouth/Throat: No oropharyngeal exudate. Mucosa moist. Eyes: Pupils are equal, round, and reactive to light. Conjunctivae are normal. No scleral icterus.  Neck: Normal range of motion. Neck supple. No JVD present.  Cardiovascular: Normal rate, regular rhythm and normal heart sounds.  Exam reveals no gallop and no friction rub.   No murmur heard. Pulmonary/Chest: Effort normal and breath sounds normal. No respiratory distress. No wheezes.No rales.  Abdominal: Soft. Bowel sounds are normal. No distension. There is no tenderness. There is no guarding.  Musculoskeletal: No edema or tenderness.  Lymphadenopathy: No cervical, axillary or supraclavicular adenopathy.  Neurological: Alert and oriented to person, place, and time. No cranial nerve deficit.  Skin: Skin is warm and dry. No rash noted. No erythema. No pallor.  Psychiatric: Affect and judgment normal.   Labs Appointment on 01/16/2018  Component Date Value Ref Range Status  . Total Protein ELP 01/16/2018 7.4  6.0 - 8.5 g/dL Final  . Albumin ELP 96/04/540903/19/2019 3.8  2.9 - 4.4 g/dL Final  . WJXBJ-4-NWGNFAOZAlpha-1-Globulin 01/16/2018 0.2  0.0 - 0.4 g/dL Final  . HYQMV-7-QIONGEXBAlpha-2-Globulin 01/16/2018 0.7  0.4 - 1.0 g/dL Final  . Beta Globulin 01/16/2018 1.1  0.7 - 1.3 g/dL Final  . Gamma Globulin 01/16/2018 1.6  0.4 - 1.8 g/dL Final  . M-Spike, % 28/41/324403/19/2019 Not Observed  Not Observed g/dL Final  . SPE Interp. 01/02/725303/19/2019 Comment   Final   Comment: (NOTE) The SPE pattern appears essentially unremarkable. Evidence of monoclonal protein is not apparent. Performed At: [facility] [address]  [place], [place] [id number] Jolene SchimkeNagendra Sanjai MD QV:[id number]:[phone]   . Comment 01/16/2018 Comment   Final   Comment: (NOTE) Protein electrophoresis scan will follow via computer, mail, or courier delivery.   Marland Kitchen. GLOBULIN, TOTAL 01/16/2018 3.6  2.2 - 3.9 g/dL Corrected  . A/G Ratio 01/16/2018 1.1  0.7 - 1.7 Corrected   Performed at [facility], [address] [place], [place] [address]     Pathology Orders  Placed This Encounter  Procedures  . Protein electrophoresis, serum    Standing Status:   Future    Number of Occurrences:   1    Standing Expiration Date:   01/17/2019       Ahmed Prima MD

## 2018-03-06 ENCOUNTER — Telehealth: Payer: Self-pay | Admitting: Family Medicine

## 2018-03-06 ENCOUNTER — Other Ambulatory Visit: Payer: Self-pay | Admitting: Family Medicine

## 2018-03-06 MED ORDER — TRAMADOL HCL 50 MG PO TABS: ORAL_TABLET | ORAL | 0 refills | Status: DC

## 2018-03-06 NOTE — Telephone Encounter (Signed)
I spoke directly wih the pt , she is aware that tramadol is prescribed , and also that she is not getting  Handicap sticker now as she is freely mobile

## 2018-03-06 NOTE — Progress Notes (Signed)
tramadol 

## 2018-03-06 NOTE — Telephone Encounter (Signed)
Pt is complaining with Knee Pain, wants to see if you can call in some Tramadol. And a Handicap Placard

## 2018-03-19 ENCOUNTER — Encounter: Payer: Self-pay | Admitting: Gastroenterology

## 2018-03-19 ENCOUNTER — Other Ambulatory Visit: Payer: Self-pay | Admitting: Family Medicine

## 2018-03-19 ENCOUNTER — Ambulatory Visit (INDEPENDENT_AMBULATORY_CARE_PROVIDER_SITE_OTHER): Payer: Medicare Other | Admitting: Gastroenterology

## 2018-03-19 VITALS — BP 118/69 | HR 65 | Temp 98.3°F | Ht 64.0 in | Wt 165.8 lb

## 2018-03-19 DIAGNOSIS — R1013 Epigastric pain: Secondary | ICD-10-CM

## 2018-03-19 DIAGNOSIS — K5900 Constipation, unspecified: Secondary | ICD-10-CM | POA: Diagnosis not present

## 2018-03-19 NOTE — Progress Notes (Signed)
Referring Provider: Kerri Perches, MD Primary Care Physician:  Kerri Perches, MD  Primary GI: Dr. Darrick Penna    Chief Complaint  Patient presents with  . Constipation    BM QOD mostly    HPI:   Laura [name] is a 67 y.o. female presenting today with a history of constipation and GERD. Last colonoscopy in 2016. EGD 2018 with gastritis, normal small bowel biopsies, multiple gastric polyps. Underwent capsule due to anemia, revealing mild gastritis. She has seen hematology with evaluation. At last visit she weighed 154. She is up to 165 today. Prior concern for weight loss at last visit. CT completed Feb 2019 and reviewed with radiology: no aortic atherosclerosis, celiac and SMA widely patent without any stenosis at the origin, IMA also open. Not dealing with chronic mesenteric ischemia.   US abdomen without gallstones. She has a history of gastritis with a fairly recent upper endoscopy July 2018. Felt to have NUD.   Still with abdominal pain, upper abdomen. Constant. No pain after eating. States it is hurting now but does not appear in distress. Very friendly, jovial.  Notes issues with constipation a few Saturdays ago. Had scant bleeding at that time. Broke out into a sweat trying to have a BM. Had to push it out. Taking Linzess 72 mcg daily. 145 mcg was too strong. Hasn't tried Amitiza that she is aware. Apples and grapes help with constipation. Unable to tolerate the viscous lidocaine, stating it burns her mouth.   Tramadol prn, once a week if that, for knee pain.   Past Medical History:  Diagnosis Date  . Anxiety   . Cystitis   . Depression   . Gastritis   . GERD (gastroesophageal reflux disease)   . Hashimoto's thyroiditis    Hx   . Hyperlipidemia   . Hypertension   . Hypothyroidism   . Normocytic anemia 2009 Hb 9.9-11.1  . Osteopenia   . Shoulder pain   . Urinary incontinence     Past Surgical History:  Procedure Laterality Date  . Bilateral tubal  ligation    . BREAST EXCISIONAL BIOPSY  2010   Excisional biopsy of benign left breast mass -lopoma   . BREAST SURGERY     left nreast biopsy  . COLONOSCOPY  08/26/2011   SLF: 1. Internal hemorrhoids  . COLONOSCOPY N/A 10/06/2015   NWG:NFAO fissure or internal hemorrhoids/mild sigmoid colitis. benign colonic path   . colonscopy  2005   Dr. Katrinka Blazing  . ESOPHAGOGASTRODUODENOSCOPY  12/2009   chronic gastritis  . ESOPHAGOGASTRODUODENOSCOPY N/A 05/17/2017   Dr. Darrick Penna; gastritis, normal small bowel biopsies, multiple gastric polyps. fundic gland polyps  . GIVENS CAPSULE STUDY N/A 05/30/2017   gastritis, no source for anemia identified  . left knee surgery Left March 1,2017  . PARTIAL KNEE ARTHROPLASTY Left 08/09/2017   Procedure: LEFT UNICOMPARTMENTAL KNEE;  Surgeon: Ollen Gross, MD;  Location: [facility];  Service: Orthopedics;  Laterality: Left;  . Resection of left lobe of thyroid    . right carpal tunnel release    . rt. knee athroscopy  2004  . SAVORY DILATION N/A 05/17/2017   Procedure: SAVORY DILATION;  Surgeon: West Bali, MD;  Location: [facility];  Service: Endoscopy;  Laterality: N/A;  . TOTAL ABDOMINAL HYSTERECTOMY  1994  . UMBILICAL HERNIA REPAIR    . Urethral dilation for stenosis  2009    Current Outpatient Medications  Medication Sig Dispense Refill  . acetaminophen (TYLENOL) 500  MG tablet Take 1,000 mg by mouth every 8 (eight) hours as needed (for pain.).     Marland Kitchen aspirin EC 81 MG tablet Take 81 mg by mouth daily.    Marland Kitchen atorvastatin (LIPITOR) 20 MG tablet TAKE 1 TABLET BY MOUTH  DAILY 90 tablet 1  . citalopram (CELEXA) 10 MG tablet TAKE 1 TABLET(10 MG) BY MOUTH DAILY 90 tablet 1  . gabapentin (NEURONTIN) 100 MG capsule Take 1 capsule (100 mg total) by mouth at bedtime. (Patient taking differently: Take 100 mg by mouth daily as needed (for restless legs). ) 30 capsule 4  . hydrochlorothiazide (HYDRODIURIL) 25 MG tablet TAKE 1 TABLET BY MOUTH  DAILY 90 tablet 1  .  hydrOXYzine (ATARAX/VISTARIL) 25 MG tablet Take 1 tablet (25 mg total) by mouth 3 (three) times daily as needed. At bed may take 2 30 tablet 0  . levothyroxine (SYNTHROID, LEVOTHROID) 112 MCG tablet TAKE 1 TABLET(112 MCG) BY MOUTH DAILY BEFORE BREAKFAST 30 tablet 5  . lidocaine (XYLOCAINE) 2 % solution Use as directed 15 mLs in the mouth or throat every 6 (six) hours as needed (abdominal pain). 200 mL 0  . linaclotide (LINZESS) 72 MCG capsule Take 1 capsule (72 mcg total) by mouth daily before breakfast. 90 capsule 3  . loratadine (CLARITIN) 10 MG tablet Take 1 tablet (10 mg total) by mouth daily. (Patient taking differently: Take 10 mg by mouth daily as needed for allergies. ) 30 tablet 3  . megestrol (MEGACE) 40 MG tablet Take 1 tablet (40 mg total) by mouth daily. 30 tablet 2  . methocarbamol (ROBAXIN) 500 MG tablet Take 1 tablet (500 mg total) by mouth every 6 (six) hours as needed for muscle spasms. 80 tablet 0  . pantoprazole (PROTONIX) 40 MG tablet TAKE 1 TABLET BY MOUTH TWO  TIMES DAILY 180 tablet 1  . Polyethyl Glycol-Propyl Glycol (SYSTANE) 0.4-0.3 % SOLN Apply 1 drop to eye 3 (three) times daily as needed (for dry eyes.).    Marland Kitchen potassium chloride (K-DUR) 10 MEQ tablet Take 1 tablet (10 mEq total) by mouth 2 (two) times daily. 60 tablet 2  . traMADol (ULTRAM) 50 MG tablet One at bedtime as needed for uncontrolled left  knee pain 30 tablet 0  . losartan (COZAAR) 50 MG tablet TAKE 1 TABLET BY MOUTH  DAILY 90 tablet 1   No current facility-administered medications for this visit.     Allergies as of 03/19/2018 - Review Complete 03/19/2018  Allergen Reaction Noted  . Dilaudid [hydromorphone hcl] Other (See Comments) 03/01/2011  . Levaquin [levofloxacin] Other (See Comments) 07/28/2014  . Nsaids  01/13/2010  . Penicillins Nausea Only 11/23/2017  . Promethazine  05/17/2017    Family History  Problem Relation Age of Onset  . Pneumonia Mother   . Colon cancer Neg Hx   . Anesthesia  problems Neg Hx   . Hypotension Neg Hx   . Malignant hyperthermia Neg Hx   . Pseudochol deficiency Neg Hx     Social History   Socioeconomic History  . Marital status: Married    Spouse name: Not on file  . Number of children: 3  . Years of education: Not on file  . Highest education level: Not on file  Occupational History  . 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 Financial resource strain: Not on file  . Food insecurity:    Worry: Not on file    Inability: Not on file  . Transportation needs:  Medical: Not on file    Non-medical: Not on file  Tobacco Use  . Smoking status: Former Smoker    Packs/day: 0.25    Years: 1.00    Pack years: 0.25    Types: Cigarettes    Last attempt to quit: 08/25/1972    Years since quitting: 45.5  . Smokeless tobacco: Never Used  Substance and Sexual Activity  . Alcohol use: No  . Drug use: No  . Sexual activity: Yes    Birth control/protection: Surgical    Comment: married  Lifestyle  . Physical activity:    Days per week: Not on file    Minutes per session: Not on file  . Stress: Not on file  Relationships  . Social connections:    Talks on phone: Not on file    Gets together: Not on file    Attends religious service: Not on file    Active member of club or organization: Not on file    Attends meetings of clubs or organizations: Not on file    Relationship status: Not on file  Other Topics Concern  . Not on file  Social History Narrative  . Not on file    Review of Systems: Gen: Denies fever, chills, anorexia. Denies fatigue, weakness, weight loss.  CV: Denies chest pain, palpitations, syncope, peripheral edema, and claudication. Resp: Denies dyspnea at rest, cough, wheezing, coughing up blood, and pleurisy. GI: see HP I Derm: Denies rash, itching, dry skin Psych: Denies depression, anxiety, memory loss, confusion. No homicidal or suicidal ideation.  Heme: Denies bruising, bleeding, and  enlarged lymph nodes.  Physical Exam: BP 118/69   Pulse 65   Temp 98.3 F (36.8 C) (Oral)   Ht  (1.626 m)   Wt 165 lb 12.8 oz (75.2 kg)   BMI 28.46 kg/m  General:   Alert and oriented. No distress noted. Pleasant and cooperative.  Head:  Normocephalic and atraumatic. Eyes:  Conjuctiva clear without scleral icterus. Mouth:  Oral mucosa pink and moist.  Abdomen:  +BS, soft, mildly TTP upper abdomen  and non-distended. No rebound or guarding. No HSM or masses noted. Msk:  Symmetrical without gross deformities. Normal posture. Extremities:  Without edema. Neurologic:  Alert and  oriented x4 Psych:  Alert and cooperative. Normal mood and affect.  Lab Results  Component Value Date   WBC 4.1 01/09/2018   HGB 10.0 (L) 01/09/2018   HCT 32.1 (L) 01/09/2018   MCV 88.4 01/09/2018   PLT 273 01/09/2018   Lab Results  Component Value Date   ALT 18 01/09/2018   AST 20 01/09/2018   ALKPHOS 85 01/09/2018   BILITOT 0.8 01/09/2018   Lab Results  Component Value Date   IRON 60 01/09/2018   TIBC 235 (L) 01/09/2018   FERRITIN 91 01/09/2018

## 2018-03-19 NOTE — Patient Instructions (Signed)
Stop Linzess.   Start Amitiza one gelcap twice a day with food (to avoid nausea). Call me with how you do, so I can send to pharmacy!  We will see you in 3 months!  It was a pleasure to see you today. I strive to create trusting relationships with patients to provide genuine, compassionate, and quality care. I value your feedback. If you receive a survey regarding your visit,  I greatly appreciate you taking time to fill this out.   Gelene Mink, PhD, ANP-BC [facility]

## 2018-03-20 NOTE — Assessment & Plan Note (Signed)
Linzess with diarrhea in higher dosages. Start Amitiza 24 mcg BID. Scant bleeding in setting of straining recently. If any further issues despite management of constipation, recommend early interval colonoscopy. Last on file in 2016. Return in 3 months.

## 2018-03-20 NOTE — Assessment & Plan Note (Signed)
67 year old female with chronic abdominal pain, known gastritis. US abdomen without gallstones. CT recently on file with patent vasculature. Weight increasing. No HIDA, but her pain is not typical for biliary etiology. Notes pain is constant and stating she is in pain during visit; however, she is quite cheerful and does not appear to be in significant discomfort. Reports of pain not consistent with exam or observation. I feel constipation is a contributor, and Linzess has not been helpful due to side effect of looser stool with higher dosages. Start Amitiza 24 mcg po BID with food. Samples provided and to call with progress reports. Continue PPI. Return in 3 months.

## 2018-03-20 NOTE — Progress Notes (Signed)
cc'ed to pcp °

## 2018-03-21 ENCOUNTER — Telehealth: Payer: Self-pay | Admitting: Gastroenterology

## 2018-03-21 DIAGNOSIS — K5900 Constipation, unspecified: Secondary | ICD-10-CM

## 2018-03-21 NOTE — Telephone Encounter (Signed)
Forwarding to refill box.  

## 2018-03-21 NOTE — Telephone Encounter (Signed)
PATIENT WOULD LIKE AMITZA SENT TO [facility] [address]

## 2018-03-22 MED ORDER — LUBIPROSTONE 24 MCG PO CAPS
24.0000 ug | ORAL_CAPSULE | Freq: Two times a day (BID) | ORAL | 3 refills | Status: DC
Start: 2018-03-22 — End: 2018-04-02

## 2018-03-22 NOTE — Telephone Encounter (Signed)
LMOM that Rx has been sent in.  

## 2018-03-22 NOTE — Telephone Encounter (Signed)
Sent!

## 2018-03-22 NOTE — Addendum Note (Signed)
Addended by: Delane Ginger, ERIC A on: 03/22/2018 03:40 PM   Modules accepted: Orders

## 2018-03-23 ENCOUNTER — Telehealth: Payer: Self-pay

## 2018-03-23 NOTE — Telephone Encounter (Signed)
Pt left Vm that Amitiza was too expensive and she will have to go back on the Linzess. I tried to call to discuss, many rings and no answer.

## 2018-03-27 NOTE — Telephone Encounter (Signed)
That is unfortunate. Can we see if a 90 day supply would be the same cost?

## 2018-03-27 NOTE — Telephone Encounter (Signed)
I spoke to pt and she said she was changed to Amitiza because the Linzess had stopped working real good and she got constipated and almost passed out in the bathroom.   The Amitiza was going to cost her $36.00 and she cannot afford that.  She said the Amitiza worked well but she does not want to pay $36.00 a month.  Tobi Bastos, please advise!

## 2018-03-28 NOTE — Telephone Encounter (Signed)
I called and spoke to the pharmacist. She said the pt would have had to pay $94.94 for the 30 days with $49.94 going toward her deductible. If 90 days it would be $184.94 with same amount $49.94 going to deductible.

## 2018-03-28 NOTE — Telephone Encounter (Signed)
If linzess is not too expensive, go back on that and add Miralax once to twice daily as needed. She was taking Linzess 72 mcg but the 145 was too strong. Resume 72 mcg daily, miralax prn.

## 2018-03-29 NOTE — Telephone Encounter (Signed)
I refaxed the application to [employer] with a note to please reply.

## 2018-03-29 NOTE — Telephone Encounter (Signed)
Pt is aware. I have her samples of the Linzess 72 mcg at front. I will check with [employer] and see why she never did get the assistance with Linzess.

## 2018-03-29 NOTE — Telephone Encounter (Signed)
LMOM to call.

## 2018-04-02 ENCOUNTER — Ambulatory Visit (INDEPENDENT_AMBULATORY_CARE_PROVIDER_SITE_OTHER): Payer: Medicare Other | Admitting: Family Medicine

## 2018-04-02 ENCOUNTER — Encounter: Payer: Self-pay | Admitting: Family Medicine

## 2018-04-02 VITALS — BP 116/78 | HR 64 | Resp 16 | Ht 64.0 in | Wt 159.0 lb

## 2018-04-02 DIAGNOSIS — E890 Postprocedural hypothyroidism: Secondary | ICD-10-CM | POA: Diagnosis not present

## 2018-04-02 DIAGNOSIS — E785 Hyperlipidemia, unspecified: Secondary | ICD-10-CM

## 2018-04-02 DIAGNOSIS — F411 Generalized anxiety disorder: Secondary | ICD-10-CM | POA: Diagnosis not present

## 2018-04-02 DIAGNOSIS — K2100 Gastro-esophageal reflux disease with esophagitis, without bleeding: Secondary | ICD-10-CM

## 2018-04-02 DIAGNOSIS — E559 Vitamin D deficiency, unspecified: Secondary | ICD-10-CM

## 2018-04-02 DIAGNOSIS — J302 Other seasonal allergic rhinitis: Secondary | ICD-10-CM

## 2018-04-02 DIAGNOSIS — I10 Essential (primary) hypertension: Secondary | ICD-10-CM | POA: Diagnosis not present

## 2018-04-02 MED ORDER — GABAPENTIN 100 MG PO CAPS: 100.0000 mg | ORAL_CAPSULE | Freq: Every day | ORAL | 4 refills | Status: DC

## 2018-04-02 MED ORDER — HYDROXYZINE HCL 25 MG PO TABS: 25.0000 mg | ORAL_TABLET | Freq: Three times a day (TID) | ORAL | 3 refills | Status: DC | PRN

## 2018-04-02 NOTE — Patient Instructions (Addendum)
Annual physical exam with MD Dec 19 or after, call if you need me before   Lipid, cmp and eGFr and vit D today'    Please resume regular exercise an dance!!! Thank you  for choosing [facility]. We consider it a privelige to serve you.  Delivering excellent health care in a caring and  compassionate way is our goal.  Partnering with you,  so that together we can achieve this goal is our strategy.

## 2018-04-03 ENCOUNTER — Encounter: Payer: Self-pay | Admitting: Family Medicine

## 2018-04-03 LAB — VITAMIN D 25 HYDROXY (VIT D DEFICIENCY, FRACTURES): VIT D 25 HYDROXY: 41 ng/mL (ref 30–100)

## 2018-04-03 LAB — COMPLETE METABOLIC PANEL WITH GFR
AG RATIO: 1.4 (calc) (ref 1.0–2.5)
ALKALINE PHOSPHATASE (APISO): 67 U/L (ref 33–130)
ALT: 18 U/L (ref 6–29)
AST: 17 U/L (ref 10–35)
Albumin: 4.3 g/dL (ref 3.6–5.1)
BILIRUBIN TOTAL: 0.7 mg/dL (ref 0.2–1.2)
BUN: 12 mg/dL (ref 7–25)
CHLORIDE: 103 mmol/L (ref 98–110)
CO2: 27 mmol/L (ref 20–32)
Calcium: 10 mg/dL (ref 8.6–10.4)
Creat: 0.88 mg/dL (ref 0.50–0.99)
GFR, Est African American: 79 mL/min/{1.73_m2} (ref >=60)
GFR, Est Non African American: 68 mL/min/{1.73_m2} (ref >=60)
Globulin: 3 g/dL (calc) (ref 1.9–3.7)
Glucose, Bld: 98 mg/dL (ref 65–99)
POTASSIUM: 3.6 mmol/L (ref 3.5–5.3)
Sodium: 138 mmol/L (ref 135–146)
Total Protein: 7.3 g/dL (ref 6.1–8.1)

## 2018-04-03 LAB — LIPID PANEL
Cholesterol: 164 mg/dL (ref <200)
HDL: 45 mg/dL — AB (ref >50)
LDL Cholesterol (Calc): 101 mg/dL (calc) — ABNORMAL HIGH
NON-HDL CHOLESTEROL (CALC): 119 mg/dL (ref <130)
TRIGLYCERIDES: 88 mg/dL (ref <150)
Total CHOL/HDL Ratio: 3.6 (calc) (ref <5.0)

## 2018-04-03 NOTE — Assessment & Plan Note (Signed)
Continue current medication, encouraged her to have telepsych consult, no interest currently Will continue medication as before

## 2018-04-03 NOTE — Assessment & Plan Note (Signed)
Controlled, no change in medication  

## 2018-04-03 NOTE — Assessment & Plan Note (Signed)
Managed by endo, stable when last checked

## 2018-04-03 NOTE — Assessment & Plan Note (Signed)
Hyperlipidemia:Low fat diet discussed and encouraged.   Lipid Panel  Lab Results  Component Value Date   CHOL 164 04/02/2018   HDL 45 (L) 04/02/2018   LDLCALC 101 (H) 04/02/2018   TRIG 88 04/02/2018   CHOLHDL 3.6 04/02/2018   Needs to increase exercise and reduce fat intake

## 2018-04-03 NOTE — Progress Notes (Signed)
   Skip EstimableJudy C Wahlen     MRN: [id number]      DOB: 03-28-1951   HPI [name] is here for follow up and re-evaluation of chronic medical conditions, medication management and review of any available recent lab and radiology data.  Preventive health is updated, specifically  Cancer screening and Immunization.   . The PT denies any adverse reactions to current medications since the last visit.  C/o feeling stressed and overwhelmed at times worries a lot ,  3 week h/o fatigue when her son was sick No regular exercise yet, left knee still a bit stiff, plans to start soon  ROS Denies recent fever or chills. Denies sinus pressure, nasal congestion, ear pain or sore throat. Denies chest congestion, productive cough or wheezing. Denies chest pains, palpitations and leg swelling Denies abdominal pain, nausea, vomiting,diarrhea or constipation.   Denies dysuria, frequency, hesitancy or incontinence.  Denies headaches, seizures, numbness, or tingling. Denies depression, c/o anxiety denies  insomnia. Denies skin break down or rash.   PE  BP 116/78   Pulse 64   Resp 16   Ht 5\' 4"  (1.626 m)   Wt 159 lb (72.1 kg)   SpO2 99%   BMI 27.29 kg/m   Patient alert and oriented and in no cardiopulmonary distress.  HEENT: No facial asymmetry, EOMI,   oropharynx pink and moist.  Neck supple no JVD, no mass.  Chest: Clear to auscultation bilaterally.  CVS: S1, S2 no murmurs, no S3.Regular rate.  ABD: Soft non tender.   Ext: No edema  MS: Adequate ROM spine, shoulders, hips and knees.  Skin: Intact, no ulcerations or rash noted.  Psych: Good eye contact, normal affect. Memory intact not anxious or depressed appearing.  CNS: CN 2-12 intact, power,  normal throughout.no focal deficits noted.   Assessment & Plan  Hyperlipidemia LDL goal <100 Hyperlipidemia:Low fat diet discussed and encouraged.   Lipid Panel  Lab Results  Component Value Date   CHOL 164 04/02/2018   HDL 45 (L)  04/02/2018   LDLCALC 101 (H) 04/02/2018   TRIG 88 04/02/2018   CHOLHDL 3.6 04/02/2018   Needs to increase exercise and reduce fat intake    GERD Controlled, no change in medication   POSTSURGICAL HYPOTHYROIDISM Managed by endo, stable when last checked  GAD (generalized anxiety disorder) Continue current medication, encouraged her to have telepsych consult, no interest currently Will continue medication as before   Allergic rhinitis Controlled, no change in medication

## 2018-04-14 ENCOUNTER — Other Ambulatory Visit: Payer: Self-pay | Admitting: Family Medicine

## 2018-04-27 ENCOUNTER — Other Ambulatory Visit: Payer: Self-pay | Admitting: Family Medicine

## 2018-05-16 ENCOUNTER — Other Ambulatory Visit: Payer: Self-pay | Admitting: Family Medicine

## 2018-06-11 ENCOUNTER — Other Ambulatory Visit: Payer: Self-pay

## 2018-06-11 ENCOUNTER — Ambulatory Visit: Payer: Medicare Other | Admitting: "Endocrinology

## 2018-06-11 ENCOUNTER — Other Ambulatory Visit: Payer: Self-pay | Admitting: "Endocrinology

## 2018-06-11 DIAGNOSIS — E039 Hypothyroidism, unspecified: Secondary | ICD-10-CM

## 2018-06-11 DIAGNOSIS — E1165 Type 2 diabetes mellitus with hyperglycemia: Secondary | ICD-10-CM

## 2018-06-12 LAB — T4, FREE: FREE T4: 1.3 ng/dL (ref 0.8–1.8)

## 2018-06-12 LAB — TSH: TSH: 0.22 m[IU]/L — AB (ref 0.40–4.50)

## 2018-06-19 ENCOUNTER — Ambulatory Visit (INDEPENDENT_AMBULATORY_CARE_PROVIDER_SITE_OTHER): Payer: Medicare Other | Admitting: Gastroenterology

## 2018-06-19 ENCOUNTER — Encounter: Payer: Self-pay | Admitting: Gastroenterology

## 2018-06-19 VITALS — BP 124/71 | HR 74 | Temp 97.1°F | Ht 64.0 in | Wt 170.6 lb

## 2018-06-19 DIAGNOSIS — K5900 Constipation, unspecified: Secondary | ICD-10-CM

## 2018-06-19 NOTE — Assessment & Plan Note (Signed)
67 year old female with constipation, doing well with dietary modifications and prn low dose Linzess. She was offered a one year follow-up but requesting 6 months, which we will do. Otherwise, doing well.

## 2018-06-19 NOTE — Progress Notes (Signed)
Referring Provider: Kerri PerchesSimpson, Margaret E, MD Primary Care Physician:  Kerri PerchesSimpson, Margaret E, MD Primary GI: Dr. Darrick PennaFields   Chief Complaint  Patient presents with  . Abdominal Pain  . Constipation    HPI:   Laura [name] is a 67 y.o. female presenting today with a history of constipation and GERD. Last colonoscopy in 2016. EGD 2018 with gastritis, normal small bowel biopsies, multiple gastric polyps. Underwent capsule due to anemia, revealing mild gastritis. She has seen hematology with evaluation. CT completed Feb 2019 and reviewed with radiology: no aortic atherosclerosis, celiac and SMA widely patent without any stenosis at the origin, IMA also open. Not dealing with chronic mesenteric ischemia.    Apples, grapes help with BMs. Linzess every other day prn. Uses samples. No straining. No abdominal pain. Occasional gas. No rectal bleeding. No N/V. Good appetite. Went to [place] for a day and enjoyed it.   Only complaint is knee pain.   Past Medical History:  Diagnosis Date  . Anxiety   . Cystitis   . Depression   . Gastritis   . GERD (gastroesophageal reflux disease)   . Hashimoto's thyroiditis    Hx   . Hyperlipidemia   . Hypertension   . Hypothyroidism   . Normocytic anemia 2009 Hb 9.9-11.1  . Osteopenia   . Shoulder pain   . Urinary incontinence     Past Surgical History:  Procedure Laterality Date  . Bilateral tubal ligation    . BREAST EXCISIONAL BIOPSY  2010   Excisional biopsy of benign left breast mass -lopoma   . BREAST SURGERY     left nreast biopsy  . COLONOSCOPY  08/26/2011   SLF: 1. Internal hemorrhoids  . COLONOSCOPY N/A 10/06/2015   UXL:[id number]:anal fissure or internal hemorrhoids/mild sigmoid colitis. benign colonic path   . colonscopy  2005   Dr. Katrinka BlazingSmith  . ESOPHAGOGASTRODUODENOSCOPY  12/2009   chronic gastritis  . ESOPHAGOGASTRODUODENOSCOPY N/A 05/17/2017   Dr. Darrick PennaFields; gastritis, normal small bowel biopsies, multiple gastric polyps. fundic gland polyps   . GIVENS CAPSULE STUDY N/A 05/30/2017   gastritis, no source for anemia identified  . left knee surgery Left March 1,2017  . PARTIAL KNEE ARTHROPLASTY Left 08/09/2017   Procedure: LEFT UNICOMPARTMENTAL KNEE;  Surgeon: Ollen GrossAluisio, Frank, MD;  Location: [facility];  Service: Orthopedics;  Laterality: Left;  . Resection of left lobe of thyroid    . right carpal tunnel release    . rt. knee athroscopy  2004  . SAVORY DILATION N/A 05/17/2017   Procedure: SAVORY DILATION;  Surgeon: West BaliFields, Sandi L, MD;  Location: [facility];  Service: Endoscopy;  Laterality: N/A;  . TOTAL ABDOMINAL HYSTERECTOMY  1994  . UMBILICAL HERNIA REPAIR    . Urethral dilation for stenosis  2009    Current Outpatient Medications  Medication Sig Dispense Refill  . acetaminophen (TYLENOL) 500 MG tablet Take 1,000 mg by mouth every 8 (eight) hours as needed (for pain.).     Marland Kitchen. atorvastatin (LIPITOR) 20 MG tablet TAKE 1 TABLET BY MOUTH  DAILY 90 tablet 1  . citalopram (CELEXA) 10 MG tablet TAKE 1 TABLET BY MOUTH  DAILY 90 tablet 1  . gabapentin (NEURONTIN) 100 MG capsule Take 1 capsule (100 mg total) by mouth at bedtime. 30 capsule 4  . hydrochlorothiazide (HYDRODIURIL) 25 MG tablet TAKE 1 TABLET BY MOUTH  DAILY 90 tablet 1  . hydrOXYzine (ATARAX/VISTARIL) 25 MG tablet Take 1 tablet (25 mg total) by mouth 3 (three) times  daily as needed. At bed may take 2 30 tablet 3  . levothyroxine (SYNTHROID, LEVOTHROID) 112 MCG tablet TAKE 1 TABLET(112 MCG) BY MOUTH DAILY BEFORE BREAKFAST 30 tablet 5  . linaclotide (LINZESS) 72 MCG capsule Take 1 capsule (72 mcg total) by mouth daily before breakfast. (Patient taking differently: Take 72 mcg by mouth every other day. ) 90 capsule 3  . loratadine (CLARITIN) 10 MG tablet Take 1 tablet (10 mg total) by mouth daily. (Patient taking differently: Take 10 mg by mouth daily as needed for allergies. ) 30 tablet 3  . losartan (COZAAR) 50 MG tablet TAKE 1 TABLET BY MOUTH  DAILY 90 tablet 1  . pantoprazole  (PROTONIX) 40 MG tablet TAKE 1 TABLET BY MOUTH TWO  TIMES DAILY 180 tablet 1  . Polyethyl Glycol-Propyl Glycol (SYSTANE) 0.4-0.3 % SOLN Apply 1 drop to eye 3 (three) times daily as needed (for dry eyes.).    Marland Kitchen. potassium chloride (K-DUR) 10 MEQ tablet Take 1 tablet (10 mEq total) by mouth 2 (two) times daily. 60 tablet 2  . traMADol (ULTRAM) 50 MG tablet One at bedtime as needed for uncontrolled left  knee pain 30 tablet 0   No current facility-administered medications for this visit.     Allergies as of 06/19/2018 - Review Complete 06/19/2018  Allergen Reaction Noted  . Dilaudid [hydromorphone hcl] Other (See Comments) 03/01/2011  . Levaquin [levofloxacin] Other (See Comments) 07/28/2014  . Nsaids  01/13/2010  . Penicillins Nausea Only 11/23/2017  . Promethazine  05/17/2017    Family History  Problem Relation Age of Onset  . Pneumonia Mother   . Colon cancer Neg Hx   . Anesthesia problems Neg Hx   . Hypotension Neg Hx   . Malignant hyperthermia Neg Hx   . Pseudochol deficiency Neg Hx     Social History   Socioeconomic History  . Marital status: Married    Spouse name: Not on file  . Number of children: 3  . Years of education: Not on file  . Highest education level: Not on file  Occupational History  . 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 Financial resource strain: Not on file  . Food insecurity:    Worry: Not on file    Inability: Not on file  . Transportation needs:    Medical: Not on file    Non-medical: Not on file  Tobacco Use  . Smoking status: Former Smoker    Packs/day: 0.25    Years: 1.00    Pack years: 0.25    Types: Cigarettes    Last attempt to quit: 08/25/1972    Years since quitting: 45.8  . Smokeless tobacco: Never Used  Substance and Sexual Activity  . Alcohol use: No  . Drug use: No  . Sexual activity: Yes    Birth control/protection: Surgical    Comment: married  Lifestyle  . Physical activity:    Days  per week: Not on file    Minutes per session: Not on file  . Stress: Not on file  Relationships  . Social connections:    Talks on phone: Not on file    Gets together: Not on file    Attends religious service: Not on file    Active member of club or organization: Not on file    Attends meetings of clubs or organizations: Not on file    Relationship status: Not on file  Other Topics Concern  . Not on file  Social History Narrative  . Not on file    Review of Systems: Gen: Denies fever, chills, anorexia. Denies fatigue, weakness, weight loss.  CV: Denies chest pain, palpitations, syncope, peripheral edema, and claudication. Resp: Denies dyspnea at rest, cough, wheezing, coughing up blood, and pleurisy. GI: see HPI  Derm: Denies rash, itching, dry skin Psych: Denies depression, anxiety, memory loss, confusion. No homicidal or suicidal ideation.  Heme: Denies bruising, bleeding, and enlarged lymph nodes.  Physical Exam: BP 124/71   Pulse 74   Temp (!) 97.1 F (36.2 C) (Oral)   Ht 5\' 4"  (1.626 m)   Wt 170 lb 9.6 oz (77.4 kg)   BMI 29.28 kg/m  General:   Alert and oriented. No distress noted. Pleasant and cooperative.  Head:  Normocephalic and atraumatic. Eyes:  Conjuctiva clear without scleral icterus. Mouth:  Oral mucosa pink and moist. Good dentition. No lesions. Abdomen:  +BS, soft, non-tender and non-distended. No rebound or guarding. No HSM or masses noted. Msk:  Symmetrical without gross deformities. Normal posture. Extremities:  Without edema. Neurologic:  Alert and  oriented x4 Psych:  Alert and cooperative. Normal mood and affect.

## 2018-06-19 NOTE — Patient Instructions (Signed)
I am so glad you are doing well!  I will see you in 6 months or sooner as needed. Continue the Lin[place] as you are doing.  Please call with any concerns!  It was a pleasure to see you today. I strive to create trusting relationships with patients to provide genuine, compassionate, and quality care. I value your feedback. If you receive a survey regarding your visit,  I greatly appreciate you taking time to fill this out.   Gelene MinkAnna W. Sagar Tengan, PhD, ANP-BC [facility]

## 2018-06-20 NOTE — Progress Notes (Signed)
CC'ED TO PCP 

## 2018-06-26 ENCOUNTER — Ambulatory Visit (INDEPENDENT_AMBULATORY_CARE_PROVIDER_SITE_OTHER): Payer: Medicare Other | Admitting: Family Medicine

## 2018-06-26 ENCOUNTER — Encounter: Payer: Self-pay | Admitting: Family Medicine

## 2018-06-26 VITALS — BP 120/80 | HR 66 | Temp 98.5°F | Resp 16 | Ht 64.0 in | Wt 172.0 lb

## 2018-06-26 DIAGNOSIS — I10 Essential (primary) hypertension: Secondary | ICD-10-CM

## 2018-06-26 DIAGNOSIS — Z23 Encounter for immunization: Secondary | ICD-10-CM | POA: Diagnosis not present

## 2018-06-26 DIAGNOSIS — J0100 Acute maxillary sinusitis, unspecified: Secondary | ICD-10-CM

## 2018-06-26 DIAGNOSIS — H60552 Acute reactive otitis externa, left ear: Secondary | ICD-10-CM | POA: Diagnosis not present

## 2018-06-26 DIAGNOSIS — J029 Acute pharyngitis, unspecified: Secondary | ICD-10-CM | POA: Diagnosis not present

## 2018-06-26 DIAGNOSIS — H6092 Unspecified otitis externa, left ear: Secondary | ICD-10-CM | POA: Insufficient documentation

## 2018-06-26 LAB — POCT RAPID STREP A (OFFICE): Rapid Strep A Screen: NEGATIVE

## 2018-06-26 NOTE — Progress Notes (Signed)
   Laura [name]     MRN: [id number]      DOB: 1951-03-20   HPI Laura [name] is hereExcess post nasal drainage with tickle, fever last night and throat looks red and left ear pain. Also has noted fatigue over the past 3 to 5 days, and has ahd chills but no documented fever ROS Denies chest congestion, productive cough or wheezing. Denies chest pains, palpitations and leg swelling Denies abdominal pain, nausea, vomiting,diarrhea or constipation.   Denies dysuria, frequency, hesitancy or incontinence. Denies joint pain, swelling and limitation in mobility. Denies headaches, seizures, numbness, or tingling. Denies depression, anxiety or insomnia. Denies skin break down or rash.   PE  BP 120/80   Pulse 66   Temp 98.5 F (36.9 C) (Oral)   Resp 16   Ht 5\' 4"  (1.626 m)   Wt 172 lb (78 kg)   SpO2 99%   BMI 29.52 kg/m   Patient alert and oriented and in no cardiopulmonary distress.  HEENT: No facial asymmetry, EOMI,   oropharynx erythematous , no exudate and moist.  Neck supple no JVD, no mass.Left anterior cervical adenitis and left maxillary sinus is tender  Chest: Clear to auscultation bilaterally.  CVS: S1, S2 no murmurs, no S3.Regular rate.  ABD: Soft non tender.   Ext: No edema  MS: Adequate ROM spine, shoulders, hips and knees.  Skin: Intact, no ulcerations or rash noted.  Psych: Good eye contact, normal affect. Memory intact not anxious or depressed appearing.  CNS: CN 2-12 intact, power,  normal throughout.no focal deficits noted.   Assessment & Plan  Acute pharyngitis Rapid stret test is negative, penicillin prescribed due to ear and sinus infection  Left otitis externa Penicillin is  prescribed  Maxillary sinusitis, acute 1 week of penicillin is prescribed  Essential hypertension Controlled, no change in medication

## 2018-06-26 NOTE — Patient Instructions (Addendum)
F/u as before, call if you need me sooner  Throat swab today to check for strep infection, due to red throat   Do NOT use Q tips please , they cause ear trauma and pain, which is why your left ear is hurting now  Penicillin is prescribed and also fluconazole in case you develop vaginal itching with the antibiotic

## 2018-06-27 MED ORDER — PENICILLIN V POTASSIUM 500 MG PO TABS: 500.0000 mg | ORAL_TABLET | Freq: Three times a day (TID) | ORAL | 0 refills | Status: DC

## 2018-06-27 MED ORDER — FLUCONAZOLE 150 MG PO TABS: ORAL_TABLET | ORAL | 0 refills | Status: DC

## 2018-07-02 NOTE — Assessment & Plan Note (Addendum)
Penicillin is  prescribed

## 2018-07-02 NOTE — Assessment & Plan Note (Signed)
Controlled, no change in medication  

## 2018-07-02 NOTE — Assessment & Plan Note (Addendum)
Rapid stret test is negative, penicillin prescribed due to ear and sinus infection

## 2018-07-02 NOTE — Assessment & Plan Note (Signed)
1 week of penicillin is prescribed

## 2018-07-04 ENCOUNTER — Telehealth: Payer: Self-pay | Admitting: Family Medicine

## 2018-07-04 ENCOUNTER — Other Ambulatory Visit: Payer: Self-pay | Admitting: Family Medicine

## 2018-07-04 DIAGNOSIS — I10 Essential (primary) hypertension: Secondary | ICD-10-CM

## 2018-07-04 MED ORDER — AMLODIPINE BESYLATE 2.5 MG PO TABS: 2.5000 mg | ORAL_TABLET | Freq: Every day | ORAL | 3 refills | Status: DC

## 2018-07-04 NOTE — Telephone Encounter (Signed)
Pt is scheduled thanks

## 2018-07-04 NOTE — Telephone Encounter (Signed)
Spoke with pt , needs to stop cozar as c/o dry tickle and cough in throat, start amlodipine 2.5 mg effective 09/05 Needs [facility] and chem 7 and EGFR today,  Needs appt with me in 6 weeks, BP re eval  \She is to complete the penicillin and a steroid ear drop will be prescribed for itching in the ear.  If no better in 1 week she calls back for eNT appt.  Pls order the labs and arrange for her f/u appt, thanks

## 2018-07-04 NOTE — Progress Notes (Signed)
Change in bP meds

## 2018-07-04 NOTE — Progress Notes (Unsigned)
Fluocinolone

## 2018-07-04 NOTE — Telephone Encounter (Signed)
Pt is here in the office, and states that the Antibiotics is not working, it is only making her urine smell, throat is no better, and running fever at night- pt request another antibiotic.

## 2018-07-04 NOTE — Telephone Encounter (Signed)
Please note-- when pt calls back she needs appt for 6 weeks

## 2018-07-04 NOTE — Telephone Encounter (Signed)
Left message that labs were ordered and to call back to schedule her appt for 6 week follow up

## 2018-07-05 ENCOUNTER — Telehealth: Payer: Self-pay | Admitting: Family Medicine

## 2018-07-05 LAB — BASIC METABOLIC PANEL WITH GFR
BUN / CREAT RATIO: 17 (calc) (ref 6–22)
BUN: 19 mg/dL (ref 7–25)
CO2: 28 mmol/L (ref 20–32)
CREATININE: 1.09 mg/dL — AB (ref 0.50–0.99)
Calcium: 10 mg/dL (ref 8.6–10.4)
Chloride: 100 mmol/L (ref 98–110)
GFR, EST AFRICAN AMERICAN: 61 mL/min/{1.73_m2} (ref >=60)
GFR, Est Non African American: 52 mL/min/{1.73_m2} — ABNORMAL LOW (ref >=60)
Glucose, Bld: 90 mg/dL (ref 65–139)
Potassium: 3.9 mmol/L (ref 3.5–5.3)
SODIUM: 136 mmol/L (ref 135–146)

## 2018-07-05 LAB — CBC
HEMATOCRIT: 32.1 % — AB (ref 35.0–45.0)
HEMOGLOBIN: 10.4 g/dL — AB (ref 11.7–15.5)
MCH: 28 pg (ref 27.0–33.0)
MCHC: 32.4 g/dL (ref 32.0–36.0)
MCV: 86.5 fL (ref 80.0–100.0)
MPV: 9.5 fL (ref 7.5–12.5)
Platelets: 275 10*3/uL (ref 140–400)
RBC: 3.71 10*6/uL — AB (ref 3.80–5.10)
RDW: 11.2 % (ref 11.0–15.0)
WBC: 5.2 10*3/uL (ref 3.8–10.8)

## 2018-07-05 MED ORDER — TRIAMCINOLONE ACETONIDE 0.1 % EX LOTN: TOPICAL_LOTION | CUTANEOUS | 0 refills | Status: DC

## 2018-07-05 NOTE — Telephone Encounter (Signed)
Direct contact made with patient, she is aware that medication is being sent to [facility] for itchy  Ear  Triamcinolone 0.1 % 4 drops in affected ear ( S) 4 times daily x 1 weewk , then as needed  10 ml no refill called in to [facility]

## 2018-07-11 ENCOUNTER — Telehealth: Payer: Self-pay | Admitting: Family Medicine

## 2018-07-11 ENCOUNTER — Encounter: Payer: Self-pay | Admitting: "Endocrinology

## 2018-07-11 ENCOUNTER — Ambulatory Visit (INDEPENDENT_AMBULATORY_CARE_PROVIDER_SITE_OTHER): Payer: Medicare Other | Admitting: "Endocrinology

## 2018-07-11 ENCOUNTER — Other Ambulatory Visit: Payer: Self-pay | Admitting: Family Medicine

## 2018-07-11 VITALS — BP 135/80 | HR 69 | Ht 64.0 in | Wt 172.0 lb

## 2018-07-11 DIAGNOSIS — E890 Postprocedural hypothyroidism: Secondary | ICD-10-CM

## 2018-07-11 DIAGNOSIS — J392 Other diseases of pharynx: Secondary | ICD-10-CM

## 2018-07-11 NOTE — Telephone Encounter (Signed)
Called in today, still hasa tickle in her throat ad cough with white- clear mucus. Feels bad ad when she checks her throat it is still red and she I tired of this and she wants to know what to do about it

## 2018-07-11 NOTE — Telephone Encounter (Signed)
Patient is scheduled with Dr.Wolicki 07/12/18 at 3:30pm. I called and spoke with patient, she is aware.

## 2018-07-11 NOTE — Progress Notes (Signed)
amb ent  

## 2018-07-11 NOTE — Progress Notes (Signed)
HPI  Laura [name] is a 67 y.o.-year-old female,  She has  medical h/o Goiter s/p Left sided hemithyroidectomy 20+ years ago,  unremarkable thyroid u/s in 2012.   She remained on levothyroxine 112 g.  She is compliant taking her medication properly.  She has stable weight  since last visit.  She denies any heat/cold intolerance, palpitations, tremors.   Stable complains of fatigue. Pt denies feeling nodules in neck, hoarseness, dysphagia/odynophagia, SOB with lying down.  No h/o radiation tx to head or neck. No recent use of iodine supplements.   ROS: Constitutional: + steady weight , no fatigue, no subjective hyperthermia. She c/o cold hands . Eyes: no blurry vision, no xerophthalmia ENT: no sore throat, no nodules palpated in throat, no dysphagia/odynophagia, no hoarseness Cardiovascular: No chest pain, no shortness of breath, no palpitations.    Skin: no rashes Neurological: No tremors, no numbness, no tingling.   Psychiatric: no depression/anxiety  PE: BP 135/80   Pulse 69   Ht 5\' 4"  (1.626 m)   Wt 172 lb (78 kg)   BMI 29.52 kg/m  Wt Readings from Last 3 Encounters:  07/11/18 172 lb (78 kg)  06/26/18 172 lb (78 kg)  06/19/18 170 lb 9.6 oz (77.4 kg)   Constitutional: overweight, not in acute distress.  Eyes: PERRLA, EOMI, no exophthalmos ENT: moist mucous membranes, no thyromegaly, no cervical lymphadenopathy Cardiovascular: RRR, No MRG Respiratory: CTA B Gastrointestinal: Abdomen is soft, nontender, bowel sounds positive.   Musculoskeletal: no deformities, strength intact in all 4 Skin: moist, warm, no rashes Neurological: no tremor with outstretched hands    Recent Results (from the past 2160 hour(s))  T4, Free     Status: None   Collection Time: 06/11/18  9:37 AM  Result Value Ref Range   Free T4 1.3 0.8 - 1.8 ng/dL  TSH     Status: Abnormal   Collection Time: 06/11/18  9:37 AM  Result Value Ref Range   TSH 0.22 (L) 0.40 - 4.50 mIU/L  POCT rapid strep A      Status: None   Collection Time: 06/26/18  3:19 PM  Result Value Ref Range   Rapid Strep A Screen Negative Negative  BASIC METABOLIC PANEL WITH GFR     Status: Abnormal   Collection Time: 07/04/18  3:34 PM  Result Value Ref Range   Glucose, Bld 90 65 - 139 mg/dL    Comment: .        Non-fasting reference interval .    BUN 19 7 - 25 mg/dL   Creat 1.61 (H) 0.96 - 0.99 mg/dL    Comment: For patients >67 years of age, the reference limit for Creatinine is approximately 13% higher for people identified as African-American. .    GFR, Est Non African American 52 (L) > OR = 60 mL/min/1.53m2   GFR, Est African American 61 > OR = 60 mL/min/1.66m2   BUN/Creatinine Ratio 17 6 - 22 (calc)   Sodium 136 135 - 146 mmol/L   Potassium 3.9 3.5 - 5.3 mmol/L   Chloride 100 98 - 110 mmol/L   CO2 28 20 - 32 mmol/L   Calcium 10.0 8.6 - 10.4 mg/dL  CBC     Status: Abnormal   Collection Time: 07/04/18  3:34 PM  Result Value Ref Range   WBC 5.2 3.8 - 10.8 Thousand/uL   RBC 3.71 (L) 3.80 - 5.10 Million/uL   Hemoglobin 10.4 (L) 11.7 - 15.5 g/dL   HCT 04.5 (L) 40.9 -  45.0 %   MCV 86.5 80.0 - 100.0 fL   MCH 28.0 27.0 - 33.0 pg   MCHC 32.4 32.0 - 36.0 g/dL   RDW 02.7 74.1 - 28.7 %   Platelets 275 140 - 400 Thousand/uL   MPV 9.5 7.5 - 12.5 fL   ASSESSMENT: 1. Hypothyroidism, postsurgical  PLAN:  - Her thyroid function tests are consistent with appropriate replacement.    - I will continue on same dose of  Levothyroxine,  112 g by mouth every morning.   - We discussed about correct intake of levothyroxine, at fasting, with water, separated by at least 30 minutes from breakfast, and separated by more than 4 hours from calcium, iron, multivitamins,  acid reflux medications (PPIs). -Patient is made aware of the fact that thyroid hormone replacement is needed for life, dose to be adjusted by periodic monitoring of thyroid function tests. -Due to absence of clinical goiter, no need for thyroid  ultrasound for now.    She is offered screening DXA since she is 15 + years postmenopausal.  Marquis Lunch, MD Phone: [phone]  Fax: [phone]   07/11/2018, 1:02 PM

## 2018-07-11 NOTE — Telephone Encounter (Signed)
Thank you VERY MUCH Dusty

## 2018-07-11 NOTE — Telephone Encounter (Signed)
I spoke directly with the pateint , she has c/o red area in her throat and says it feels "warm" I advised best Doctor to dx is ENT and I will arrange soonest available appt,

## 2018-07-11 NOTE — Telephone Encounter (Signed)
Pt stopped by --she is not any better, throat still is Red, mucus when comes up is white, no other systems. Pt states she is tired and going home to lay down.   Pt looks weak eyed to me

## 2018-07-12 DIAGNOSIS — J392 Other diseases of pharynx: Secondary | ICD-10-CM | POA: Insufficient documentation

## 2018-07-12 DIAGNOSIS — K219 Gastro-esophageal reflux disease without esophagitis: Secondary | ICD-10-CM | POA: Diagnosis not present

## 2018-07-13 ENCOUNTER — Telehealth: Payer: Self-pay | Admitting: Family Medicine

## 2018-07-13 NOTE — Telephone Encounter (Signed)
Requesting tramadol for her knee, cant stand up long or walk much while shes traveling with church.  Seen Dr.Wolicki yesterday he says everything is fine and this maybe acid reflux. He told her to take 2  protonics at night after dinner. Throat is red, not painful, just a tickle w/ a slight cough. Cb#: [id number] [id number]

## 2018-07-15 ENCOUNTER — Other Ambulatory Visit: Payer: Self-pay | Admitting: Family Medicine

## 2018-07-15 MED ORDER — TRAMADOL HCL 50 MG PO TABS: ORAL_TABLET | ORAL | 3 refills | Status: DC

## 2018-07-15 NOTE — Telephone Encounter (Signed)
pls let her know the medication was sent in on Sunday and it has refills in cxase she needs to refill. Happy to hear she is now satisfied and BELATED HAPPY BIRTHDAY!!,)

## 2018-07-16 NOTE — Telephone Encounter (Signed)
Patient aware.

## 2018-07-30 ENCOUNTER — Ambulatory Visit: Payer: Medicare Other

## 2018-07-30 ENCOUNTER — Ambulatory Visit (INDEPENDENT_AMBULATORY_CARE_PROVIDER_SITE_OTHER): Payer: Medicare Other

## 2018-07-30 VITALS — BP 128/80 | HR 67 | Resp 15 | Ht 64.0 in | Wt 171.0 lb

## 2018-07-30 DIAGNOSIS — M8580 Other specified disorders of bone density and structure, unspecified site: Secondary | ICD-10-CM

## 2018-07-30 DIAGNOSIS — Z0000 Encounter for general adult medical examination without abnormal findings: Secondary | ICD-10-CM | POA: Diagnosis not present

## 2018-07-30 MED ORDER — FLUCONAZOLE 150 MG PO TABS: ORAL_TABLET | ORAL | 0 refills | Status: DC

## 2018-07-30 NOTE — Patient Instructions (Signed)
Laura [name] , Thank you for taking time to come for your Medicare Wellness Visit. I appreciate your ongoing commitment to your health goals. Please review the following plan we discussed and let me know if I can assist you in the future.   Schedule wellness in 1 year    Screening recommendations/referrals: Colonoscopy: up to date  Mammogram: up to date  Bone Density: ordered  Recommended yearly ophthalmology/optometry visit for glaucoma screening and checkup Recommended yearly dental visit for hygiene and checkup  Vaccinations: Influenza vaccine: up to date  Pneumococcal vaccine: up to date  Tdap vaccine: up to date  Shingles vaccine: up to date   Advanced directives: form given   Conditions/risks identified: done   Next appointment: Wellness in 1 year    Preventive Care 65 Years and Older, Female Preventive care refers to lifestyle choices and visits with your health care provider that can promote health and wellness. What does preventive care include?  A yearly physical exam. This is also called an annual well check.  Dental exams once or twice a year.  Routine eye exams. Ask your health care provider how often you should have your eyes checked.  Personal lifestyle choices, including:  Daily care of your teeth and gums.  Regular physical activity.  Eating a healthy diet.  Avoiding tobacco and drug use.  Limiting alcohol use.  Practicing safe sex.  Taking low-dose aspirin every day.  Taking vitamin and mineral supplements as recommended by your health care provider. What happens during an annual well check? The services and screenings done by your health care provider during your annual well check will depend on your age, overall health, lifestyle risk factors, and family history of disease. Counseling  Your health care provider may ask you questions about your:  Alcohol use.  Tobacco use.  Drug use.  Emotional well-being.  Home and relationship  well-being.  Sexual activity.  Eating habits.  History of falls.  Memory and ability to understand (cognition).  Work and work Astronomer.  Reproductive health. Screening  You may have the following tests or measurements:  Height, weight, and BMI.  Blood pressure.  Lipid and cholesterol levels. These may be checked every 5 years, or more frequently if you are over 28 years old.  Skin check.  Lung cancer screening. You may have this screening every year starting at age 82 if you have a 30-pack-year history of smoking and currently smoke or have quit within the past 15 years.  Fecal occult blood test (FOBT) of the stool. You may have this test every year starting at age 9.  Flexible sigmoidoscopy or colonoscopy. You may have a sigmoidoscopy every 5 years or a colonoscopy every 10 years starting at age 9.  Hepatitis C blood test.  Hepatitis B blood test.  Sexually transmitted disease (STD) testing.  Diabetes screening. This is done by checking your blood sugar (glucose) after you have not eaten for a while (fasting). You may have this done every 1-3 years.  Bone density scan. This is done to screen for osteoporosis. You may have this done starting at age 42.  Mammogram. This may be done every 1-2 years. Talk to your health care provider about how often you should have regular mammograms. Talk with your health care provider about your test results, treatment options, and if necessary, the need for more tests. Vaccines  Your health care provider may recommend certain vaccines, such as:  Influenza vaccine. This is recommended every year.  Tetanus, diphtheria,  and acellular pertussis (Tdap, Td) vaccine. You may need a Td booster every 10 years.  Zoster vaccine. You may need this after age 32.  Pneumococcal 13-valent conjugate (PCV13) vaccine. One dose is recommended after age 67.  Pneumococcal polysaccharide (PPSV23) vaccine. One dose is recommended after age  29. Talk to your health care provider about which screenings and vaccines you need and how often you need them. This information is not intended to replace advice given to you by your health care provider. Make sure you discuss any questions you have with your health care provider. Document Released: 11/13/2015 Document Revised: 07/06/2016 Document Reviewed: 08/18/2015 Elsevier Interactive Patient Education  2017 [employer] Prevention in the Home Falls can cause injuries. They can happen to people of all ages. There are many things you can do to make your home safe and to help prevent falls. What can I do on the outside of my home?  Regularly fix the edges of walkways and driveways and fix any cracks.  Remove anything that might make you trip as you walk through a door, such as a raised step or threshold.  Trim any bushes or trees on the path to your home.  Use bright outdoor lighting.  Clear any walking paths of anything that might make someone trip, such as rocks or tools.  Regularly check to see if handrails are loose or broken. Make sure that both sides of any steps have handrails.  Any raised decks and porches should have guardrails on the edges.  Have any leaves, snow, or ice cleared regularly.  Use sand or salt on walking paths during winter.  Clean up any spills in your garage right away. This includes oil or grease spills. What can I do in the bathroom?  Use night lights.  Install grab bars by the toilet and in the tub and shower. Do not use towel bars as grab bars.  Use non-skid mats or decals in the tub or shower.  If you need to sit down in the shower, use a plastic, non-slip stool.  Keep the floor dry. Clean up any water that spills on the floor as soon as it happens.  Remove soap buildup in the tub or shower regularly.  Attach bath mats securely with double-sided non-slip rug tape.  Do not have throw rugs and other things on the floor that can make  you trip. What can I do in the bedroom?  Use night lights.  Make sure that you have a light by your bed that is easy to reach.  Do not use any sheets or blankets that are too big for your bed. They should not hang down onto the floor.  Have a firm chair that has side arms. You can use this for support while you get dressed.  Do not have throw rugs and other things on the floor that can make you trip. What can I do in the kitchen?  Clean up any spills right away.  Avoid walking on wet floors.  Keep items that you use a lot in easy-to-reach places.  If you need to reach something above you, use a strong step stool that has a grab bar.  Keep electrical cords out of the way.  Do not use floor polish or wax that makes floors slippery. If you must use wax, use non-skid floor wax.  Do not have throw rugs and other things on the floor that can make you trip. What can I do with my  stairs?  Do not leave any items on the stairs.  Make sure that there are handrails on both sides of the stairs and use them. Fix handrails that are broken or loose. Make sure that handrails are as long as the stairways.  Check any carpeting to make sure that it is firmly attached to the stairs. Fix any carpet that is loose or worn.  Avoid having throw rugs at the top or bottom of the stairs. If you do have throw rugs, attach them to the floor with carpet tape.  Make sure that you have a light switch at the top of the stairs and the bottom of the stairs. If you do not have them, ask someone to add them for you. What else can I do to help prevent falls?  Wear shoes that:  Do not have high heels.  Have rubber bottoms.  Are comfortable and fit you well.  Are closed at the toe. Do not wear sandals.  If you use a stepladder:  Make sure that it is fully opened. Do not climb a closed stepladder.  Make sure that both sides of the stepladder are locked into place.  Ask someone to hold it for you, if  possible.  Clearly mark and make sure that you can see:  Any grab bars or handrails.  First and last steps.  Where the edge of each step is.  Use tools that help you move around (mobility aids) if they are needed. These include:  Canes.  Walkers.  Scooters.  Crutches.  Turn on the lights when you go into a dark area. Replace any light bulbs as soon as they burn out.  Set up your furniture so you have a clear path. Avoid moving your furniture around.  If any of your floors are uneven, fix them.  If there are any pets around you, be aware of where they are.  Review your medicines with your doctor. Some medicines can make you feel dizzy. This can increase your chance of falling. Ask your doctor what other things that you can do to help prevent falls. This information is not intended to replace advice given to you by your health care provider. Make sure you discuss any questions you have with your health care provider. Document Released: 08/13/2009 Document Revised: 03/24/2016 Document Reviewed: 11/21/2014 Elsevier Interactive Patient Education  2017 [employer].

## 2018-07-30 NOTE — Progress Notes (Signed)
Subjective:   Laura [name] is a 67 y.o. female who presents for Medicare Annual (Subsequent) preventive examination.  Review of Systems:   Cardiac Risk Factors include: advanced age (>15men, >12 women);dyslipidemia;hypertension     Objective:     Vitals: BP 128/80   Pulse 67   Resp 15   Ht 5\' 4"  (1.626 m)   Wt 171 lb (77.6 kg)   SpO2 97%   BMI 29.35 kg/m   Body mass index is 29.35 kg/m.  Advanced Directives 07/30/2018 01/16/2018 08/18/2017 08/10/2017 08/03/2017 07/25/2017 07/19/2017  Does Patient Have a Medical Advance Directive? No No No No No No No  Would patient like information on creating a medical advance directive? Yes (ED - Information included in AVS) No - Patient declined No - Patient declined No - Patient declined No - Patient declined Yes (MAU/Ambulatory/Procedural Areas - Information given) No - Patient declined  Pre-existing out of facility DNR order (yellow form or pink MOST form) - - - - - - -    Tobacco Social History   Tobacco Use  Smoking Status Former Smoker  . Packs/day: 0.25  . Years: 1.00  . Pack years: 0.25  . Types: Cigarettes  . Last attempt to quit: 08/25/1972  . Years since quitting: 45.9  Smokeless Tobacco Never Used     Counseling given: Not Answered   Clinical Intake:  Pre-visit preparation completed: Yes  Pain : No/denies pain Pain Score: 0-No pain     Nutritional Status: BMI 25 -29 Overweight Diabetes: No  How often do you need to have someone help you when you read instructions, pamphlets, or other written materials from your doctor or pharmacy?: 2 - Rarely What is the last grade level you completed in school?: college   Interpreter Needed?: No  Information entered by :: brandi hudy LPN   Past Medical History:  Diagnosis Date  . Anxiety   . Cystitis   . Depression   . Gastritis   . GERD (gastroesophageal reflux disease)   . Hashimoto's thyroiditis    Hx   . Hyperlipidemia   . Hypertension   . Hypothyroidism     . Normocytic anemia 2009 Hb 9.9-11.1  . Osteopenia   . Shoulder pain   . Urinary incontinence    Past Surgical History:  Procedure Laterality Date  . Bilateral tubal ligation    . BREAST EXCISIONAL BIOPSY  2010   Excisional biopsy of benign left breast mass -lopoma   . BREAST SURGERY     left nreast biopsy  . COLONOSCOPY  08/26/2011   SLF: 1. Internal hemorrhoids  . COLONOSCOPY N/A 10/06/2015   WUJ:WJXB fissure or internal hemorrhoids/mild sigmoid colitis. benign colonic path   . colonscopy  2005   Dr. Katrinka Blazing  . ESOPHAGOGASTRODUODENOSCOPY  12/2009   chronic gastritis  . ESOPHAGOGASTRODUODENOSCOPY N/A 05/17/2017   Dr. Darrick Penna; gastritis, normal small bowel biopsies, multiple gastric polyps. fundic gland polyps  . GIVENS CAPSULE STUDY N/A 05/30/2017   gastritis, no source for anemia identified  . left knee surgery Left March 1,2017  . PARTIAL KNEE ARTHROPLASTY Left 08/09/2017   Procedure: LEFT UNICOMPARTMENTAL KNEE;  Surgeon: Ollen Gross, MD;  Location: [facility];  Service: Orthopedics;  Laterality: Left;  . Resection of left lobe of thyroid    . right carpal tunnel release    . rt. knee athroscopy  2004  . SAVORY DILATION N/A 05/17/2017   Procedure: SAVORY DILATION;  Surgeon: West Bali, MD;  Location: [facility];  Service: Endoscopy;  Laterality: N/A;  . TOTAL ABDOMINAL HYSTERECTOMY  1994  . UMBILICAL HERNIA REPAIR    . Urethral dilation for stenosis  2009   Family History  Problem Relation Age of Onset  . Pneumonia Mother   . Colon cancer Neg Hx   . Anesthesia problems Neg Hx   . Hypotension Neg Hx   . Malignant hyperthermia Neg Hx   . Pseudochol deficiency Neg Hx    Social History   Socioeconomic History  . Marital status: Married    Spouse name: Not on file  . Number of children: 3  . Years of education: Not on file  . Highest education level: Not on file  Occupational History  . 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  Financial resource strain: Not on file  . Food insecurity:    Worry: Not on file    Inability: Not on file  . Transportation needs:    Medical: Not on file    Non-medical: Not on file  Tobacco Use  . Smoking status: Former Smoker    Packs/day: 0.25    Years: 1.00    Pack years: 0.25    Types: Cigarettes    Last attempt to quit: 08/25/1972    Years since quitting: 45.9  . Smokeless tobacco: Never Used  Substance and Sexual Activity  . Alcohol use: No  . Drug use: No  . Sexual activity: Yes    Birth control/protection: Surgical    Comment: married  Lifestyle  . Physical activity:    Days per week: Not on file    Minutes per session: Not on file  . Stress: Not on file  Relationships  . Social connections:    Talks on phone: Not on file    Gets together: Not on file    Attends religious service: Not on file    Active member of club or organization: Not on file    Attends meetings of clubs or organizations: Not on file    Relationship status: Not on file  Other Topics Concern  . Not on file  Social History Narrative  . Not on file    Outpatient Encounter Medications as of 07/30/2018  Medication Sig  . acetaminophen (TYLENOL) 500 MG tablet Take 1,000 mg by mouth every 8 (eight) hours as needed (for pain.).   Marland Kitchen amLODipine (NORVASC) 2.5 MG tablet Take 1 tablet (2.5 mg total) by mouth daily.  Marland Kitchen atorvastatin (LIPITOR) 20 MG tablet TAKE 1 TABLET BY MOUTH  DAILY  . citalopram (CELEXA) 10 MG tablet TAKE 1 TABLET BY MOUTH  DAILY  . fluconazole (DIFLUCAN) 150 MG tablet One tablet once daily, as needed, for vaginal itch associated with antibiotic use  . gabapentin (NEURONTIN) 100 MG capsule Take 1 capsule (100 mg total) by mouth at bedtime.  . hydrochlorothiazide (HYDRODIURIL) 25 MG tablet TAKE 1 TABLET BY MOUTH  DAILY  . hydrOXYzine (ATARAX/VISTARIL) 25 MG tablet Take 1 tablet (25 mg total) by mouth 3 (three) times daily as needed. At bed may take 2  . levothyroxine (SYNTHROID,  LEVOTHROID) 112 MCG tablet TAKE 1 TABLET(112 MCG) BY MOUTH DAILY BEFORE BREAKFAST  . linaclotide (LINZESS) 72 MCG capsule Take 1 capsule (72 mcg total) by mouth daily before breakfast. (Patient taking differently: Take 72 mcg by mouth every other day. )  . loratadine (CLARITIN) 10 MG tablet Take 1 tablet (10 mg total) by mouth daily. (Patient taking differently: Take 10 mg by mouth daily  as needed for allergies. )  . pantoprazole (PROTONIX) 40 MG tablet TAKE 1 TABLET BY MOUTH TWO  TIMES DAILY  . penicillin v potassium (VEETID) 500 MG tablet Take 1 tablet (500 mg total) by mouth 3 (three) times daily.  Bertram Gala Glycol-Propyl Glycol (SYSTANE) 0.4-0.3 % SOLN Apply 1 drop to eye 3 (three) times daily as needed (for dry eyes.).  Marland Kitchen potassium chloride (K-DUR) 10 MEQ tablet Take 1 tablet (10 mEq total) by mouth 2 (two) times daily.  . traMADol (ULTRAM) 50 MG tablet One tablet at bedtime, as needed, for uncontrolled knee pain  . triamcinolone lotion (KENALOG) 0.1 % 4 drops in affected ear(s) four times daily for 1 week, then as needed  . [DISCONTINUED] fluconazole (DIFLUCAN) 150 MG tablet One tablet once daily, as needed, for vaginal itch associated with antibiotic use   No facility-administered encounter medications on file as of 07/30/2018.     Activities of Daily Living In your present state of health, do you have any difficulty performing the following activities: 07/30/2018 10/11/2017  Hearing? N N  Vision? N N  Difficulty concentrating or making decisions? N N  Walking or climbing stairs? Y N  Comment knee pain  -  Dressing or bathing? N N  Doing errands, shopping? N N  Preparing Food and eating ? N -  Using the Toilet? N -  In the past six months, have you accidently leaked urine? N -  Do you have problems with loss of bowel control? N -  Managing your Medications? N -  Managing your Finances? N -  Housekeeping or managing your Housekeeping? N -  Some recent data might be hidden     Patient Care Team: Kerri Perches, MD as PCP - [facility] Fields, Darleene Cleaver, MD (Gastroenterology) Laqueta Linden, MD as Attending Physician (Cardiology) Beverely Low, MD as Consulting Physician (Orthopedic Surgery) Roma Kayser, MD as Consulting Physician (Endocrinology) Ollen Gross, MD as Consulting Physician (Orthopedic Surgery)    Assessment:   This is a routine wellness examination for Shanecia.  Exercise Activities and Dietary recommendations Current Exercise Habits: Structured exercise class, Type of exercise: strength training/weights, Time (Minutes): 30, Frequency (Times/Week): 2, Weekly Exercise (Minutes/Week): 60, Intensity: Moderate, Exercise limited by: cardiac condition(s)  Goals    . Exercise 3x per week (30 min per time)     Recommend starting a routine exercise program at least 3 days a week for 30-45 minutes at a time as tolerated.         Fall Risk Fall Risk  06/26/2018 04/02/2018 02/13/2018 12/11/2017 07/25/2017  Falls in the past year? No No No No No  Number falls in past yr: - - - - -  Injury with Fall? - - - - -   Is the patient's home free of loose throw rugs in walkways, pet beds, electrical cords, etc?   yes      Grab bars in the bathroom? yes      Handrails on the stairs?   yes      Adequate lighting?   yes  Timed Get Up and Go performed:   Depression Screen PHQ 2/9 Scores 07/30/2018 06/26/2018 04/02/2018 04/02/2018  PHQ - 2 Score 0 0 0 0  PHQ- 9 Score 0 2 2 1      Cognitive Function     6CIT Screen 07/30/2018 07/25/2017  What Year? 0 points 0 points  What month? 0 points 0 points  What time? 0 points 0 points  Count  back from 20 0 points 0 points  Months in reverse 0 points 0 points  Repeat phrase 0 points 0 points  Total Score 0 0    Immunization History  Administered Date(s) Administered  . Influenza Split 07/17/2012  . Influenza Whole 10/08/2003, 07/01/2010, 07/14/2011  . Influenza,inj,Quad PF,6+ Mos 08/14/2013,  08/28/2014, 09/03/2015, 08/10/2016, 06/21/2017, 06/26/2018  . Pneumococcal Conjugate-13 06/05/2014  . Pneumococcal Polysaccharide-23 11/20/2015  . Td 04/21/2004  . Tdap 06/05/2014  . Zoster 03/31/2011    Qualifies for Shingles Vaccine? Ask insurance if covered   Screening Tests Health Maintenance  Topic Date Due  . URINE MICROALBUMIN  07/11/1960  . MAMMOGRAM  10/03/2019  . TETANUS/TDAP  06/05/2024  . COLONOSCOPY  10/05/2025  . INFLUENZA VACCINE  Completed  . DEXA SCAN  Completed  . Hepatitis C Screening  Completed  . PNA vac Low Risk Adult  Completed    Cancer Screenings: Lung: Low Dose CT Chest recommended if Age 4-80 years, 30 pack-year currently smoking OR have quit w/in 15years. Patient does not qualify. Breast:  Up to date on Mammogram? Yes, ordered   Up to date of Bone Density/Dexa? Yes, ordered  Colorectal: up to date   Additional Screenings:  Hepatitis C Screening:      Plan:     I have personally reviewed and noted the following in the patient's chart:   . Medical and social history . Use of alcohol, tobacco or illicit drugs  . Current medications and supplements . Functional ability and status . Nutritional status . Physical activity . Advanced directives . List of other physicians . Hospitalizations, surgeries, and ER visits in previous 12 months . Vitals . Screenings to include cognitive, depression, and falls . Referrals and appointments  In addition, I have reviewed and discussed with patient certain preventive protocols, quality metrics, and best practice recommendations. A written personalized care plan for preventive services as well as general preventive health recommendations were provided to patient.     Everitt Amber, LPN, LPN  [id number]

## 2018-08-07 ENCOUNTER — Other Ambulatory Visit: Payer: Self-pay | Admitting: Family Medicine

## 2018-08-08 ENCOUNTER — Ambulatory Visit (HOSPITAL_COMMUNITY)
Admission: RE | Admit: 2018-08-08 | Discharge: 2018-08-08 | Disposition: A | Discharge status: Home / Self Care | Payer: Medicare Other | Source: Ambulatory Visit | Attending: Family Medicine | Admitting: Family Medicine

## 2018-08-08 DIAGNOSIS — M8580 Other specified disorders of bone density and structure, unspecified site: Secondary | ICD-10-CM | POA: Diagnosis not present

## 2018-08-08 DIAGNOSIS — Z13820 Encounter for screening for osteoporosis: Secondary | ICD-10-CM | POA: Insufficient documentation

## 2018-08-15 ENCOUNTER — Ambulatory Visit (INDEPENDENT_AMBULATORY_CARE_PROVIDER_SITE_OTHER): Payer: Medicare Other | Admitting: Family Medicine

## 2018-08-15 ENCOUNTER — Encounter: Payer: Self-pay | Admitting: Family Medicine

## 2018-08-15 VITALS — BP 120/70 | HR 65 | Resp 12 | Ht 65.0 in | Wt 172.0 lb

## 2018-08-15 DIAGNOSIS — E785 Hyperlipidemia, unspecified: Secondary | ICD-10-CM

## 2018-08-15 DIAGNOSIS — Z9181 History of falling: Secondary | ICD-10-CM | POA: Insufficient documentation

## 2018-08-15 DIAGNOSIS — K2100 Gastro-esophageal reflux disease with esophagitis, without bleeding: Secondary | ICD-10-CM

## 2018-08-15 DIAGNOSIS — Z1231 Encounter for screening mammogram for malignant neoplasm of breast: Secondary | ICD-10-CM

## 2018-08-15 DIAGNOSIS — I10 Essential (primary) hypertension: Secondary | ICD-10-CM | POA: Diagnosis not present

## 2018-08-15 DIAGNOSIS — F411 Generalized anxiety disorder: Secondary | ICD-10-CM | POA: Diagnosis not present

## 2018-08-15 DIAGNOSIS — E890 Postprocedural hypothyroidism: Secondary | ICD-10-CM

## 2018-08-15 HISTORY — DX: History of falling: Z91.81

## 2018-08-15 NOTE — Assessment & Plan Note (Signed)
Reports red raw spot in back of throat , inability to sing has had ENT eval, no pathology, reports increased cough, needs sooner GI appt

## 2018-08-15 NOTE — Patient Instructions (Addendum)
Physical exam as before  Please get mammogram appt at checkout, due in December   You are referred for GI re eval for uncontrolled reflux.  Thankful ears feel better and you are celebrating YOUR life.  Happy belated birthday  Thank you  for choosing [facility]. We consider it a privelige to serve you.  Delivering excellent health care in a caring and  compassionate way is our goal.  Partnering with you,  so that together we can achieve this goal is our strategy.

## 2018-08-15 NOTE — Assessment & Plan Note (Signed)
Pt reports that she has no instability of knees or hips. She is aware of the need and practices good lighting. No cords and clutter in the home  Denies any fall or near fall in the past 12 months. Understands the need to commit to daily exercises and stretching exercise  To improve gait and reduce fall risk No medication makes her dizzy

## 2018-09-09 ENCOUNTER — Encounter: Payer: Self-pay | Admitting: Family Medicine

## 2018-09-09 NOTE — Assessment & Plan Note (Signed)
Hyperlipidemia:Low fat diet discussed and encouraged.   Lipid Panel  Lab Results  Component Value Date   CHOL 164 04/02/2018   HDL 45 (L) 04/02/2018   LDLCALC 101 (H) 04/02/2018   TRIG 88 04/02/2018   CHOLHDL 3.6 04/02/2018   Needs to increase exercise commitment and lower fat intake

## 2018-09-09 NOTE — Assessment & Plan Note (Signed)
Controlled, no change in medication DASH diet and commitment to daily physical activity for a minimum of 30 minutes discussed and encouraged, as a part of hypertension management. The importance of attaining a healthy weight is also discussed.  BP/Weight 08/15/2018 07/30/2018 07/11/2018 06/26/2018 06/19/2018 04/02/2018 03/19/2018  Systolic BP 120 128 135 120 124 116 118  Diastolic BP 70 80 80 80 71 78 69  Wt. (Lbs) 172 171 172 172 170.6 159 165.8  BMI 28.62 29.35 29.52 29.52 29.28 27.29 28.46

## 2018-09-09 NOTE — Assessment & Plan Note (Signed)
Managed by endo and controlled 

## 2018-09-09 NOTE — Assessment & Plan Note (Signed)
Controlled, no change in medication  

## 2018-09-09 NOTE — Progress Notes (Signed)
   Laura [name]     MRN: [id number]      DOB: 03-08-1951   HPI Laura [name] is here for follow up and re-evaluation of chronic medical conditions, medication management and review of any available recent lab and radiology data.  Preventive health is updated, specifically  Cancer screening and Immunization.   Questions or concerns regarding consultations or procedures which the PT has had in the interim are  addressed. The PT denies any adverse reactions to current medications since the last visit.  Cx/o increased and uncontrolled reflux symptoms  ROS Denies recent fever or chills. Denies sinus pressure, nasal congestion, ear pain or sore throat. Denies chest congestion, productive cough or wheezing. Denies chest pains, palpitations and leg swelling Denies diarrhea or constipation.   Denies dysuria, frequency, hesitancy or incontinence. Denies joint pain, swelling and limitation in mobility. Denies headaches, seizures, numbness, or tingling. Denies depression, anxiety or insomnia. Denies skin break down or rash.   PE  BP 120/70 (BP Location: Right Arm, Patient Position: Sitting, Cuff Size: Large)   Pulse 65   Resp 12   Ht 5\' 5"  (1.651 m)   Wt 172 lb (78 kg)   SpO2 97%   BMI 28.62 kg/m   Patient alert and oriented and in no cardiopulmonary distress.  HEENT: No facial asymmetry, EOMI,   oropharynx pink and moist.  Neck supple no JVD, no mass.  Chest: Clear to auscultation bilaterally.  CVS: S1, S2 no murmurs, no S3.Regular rate.  ABD: Soft mild epigastric tenderness, no guarding or rebound, normal BS  Ext: No edema  MS: Adequate ROM spine, shoulders, hips and knees.  Skin: Intact, no ulcerations or rash noted.  Psych: Good eye contact, normal affect. Memory intact not anxious or depressed appearing.  CNS: CN 2-12 intact, power,  normal throughout.no focal deficits noted.   Assessment & Plan  At low risk for fall Pt reports that she has no instability of knees or  hips. She is aware of the need and practices good lighting. No cords and clutter in the home  Denies any fall or near fall in the past 12 months. Understands the need to commit to daily exercises and stretching exercise  To improve gait and reduce fall risk No medication makes her dizzy   GERD Reports red raw spot in back of throat , inability to sing has had ENT eval, no pathology, reports increased cough, needs sooner GI appt  Essential hypertension Controlled, no change in medication DASH diet and commitment to daily physical activity for a minimum of 30 minutes discussed and encouraged, as a part of hypertension management. The importance of attaining a healthy weight is also discussed.  BP/Weight 08/15/2018 07/30/2018 07/11/2018 06/26/2018 06/19/2018 04/02/2018 03/19/2018  Systolic BP 120 128 135 120 124 116 118  Diastolic BP 70 80 80 80 71 78 69  Wt. (Lbs) 172 171 172 172 170.6 159 165.8  BMI 28.62 29.35 29.52 29.52 29.28 27.29 28.46       Hyperlipidemia LDL goal <100 Hyperlipidemia:Low fat diet discussed and encouraged.   Lipid Panel  Lab Results  Component Value Date   CHOL 164 04/02/2018   HDL 45 (L) 04/02/2018   LDLCALC 101 (H) 04/02/2018   TRIG 88 04/02/2018   CHOLHDL 3.6 04/02/2018   Needs to increase exercise commitment and lower fat intake    POSTSURGICAL HYPOTHYROIDISM Managed by endo and controlled  GAD (generalized anxiety disorder) Controlled, no change in medication

## 2018-09-17 ENCOUNTER — Telehealth: Payer: Self-pay | Admitting: Gastroenterology

## 2018-09-17 NOTE — Telephone Encounter (Signed)
Patient left letter for me stating GERD was severe. She has seen ENT already. PPI BID did not help significantly.   Will change to Dexilant once daily, Pepcid at night. Do not eat 3 hours before laying down. HOB on risers if possible. Patient to call with update on how this does for her.   Please leave samples of Dexilant for patient. Thanks!

## 2018-09-17 NOTE — Telephone Encounter (Signed)
Dexilant 60 mg #15 at front desk for pick up to take one capsule daily 30 min prior to breakfast.

## 2018-09-24 IMAGING — US US ABDOMEN LIMITED
1 series · 14 of 25 positions shown · non-contrast
Comparison: CT [DATE].

CLINICAL DATA: Abdominal pain.  Weight loss.

EXAM:
ULTRASOUND ABDOMEN LIMITED RIGHT UPPER QUADRANT

[Series 1: us abdomen limited · 0.19mm/px · 14 of 55 slices shown]
[im 1/55]
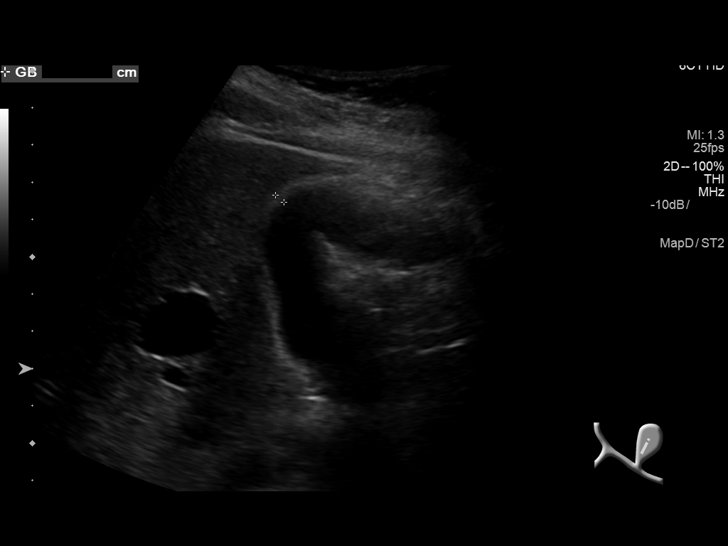
[im 5/55]
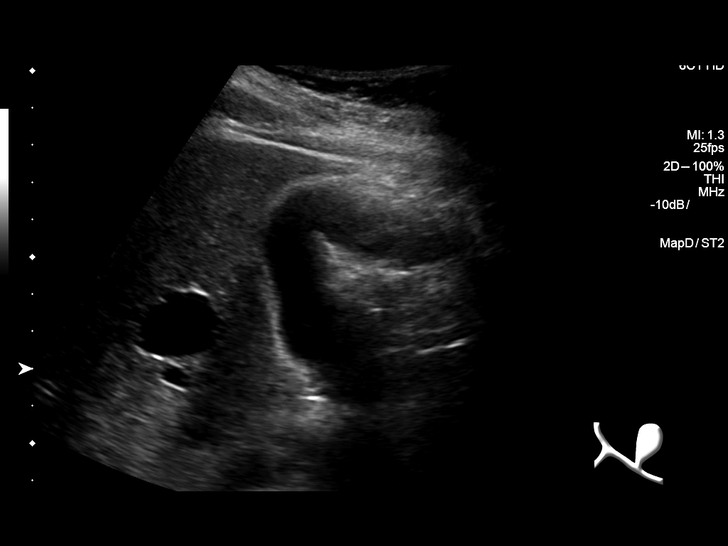
[im 10/55]
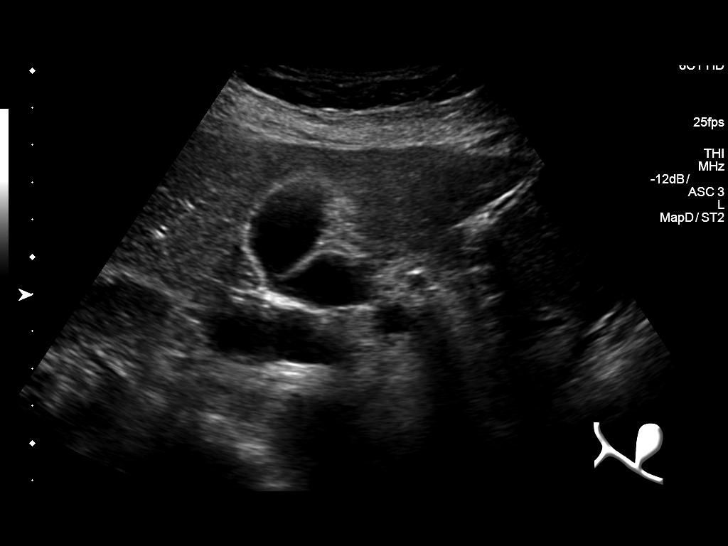
[im 14/55]
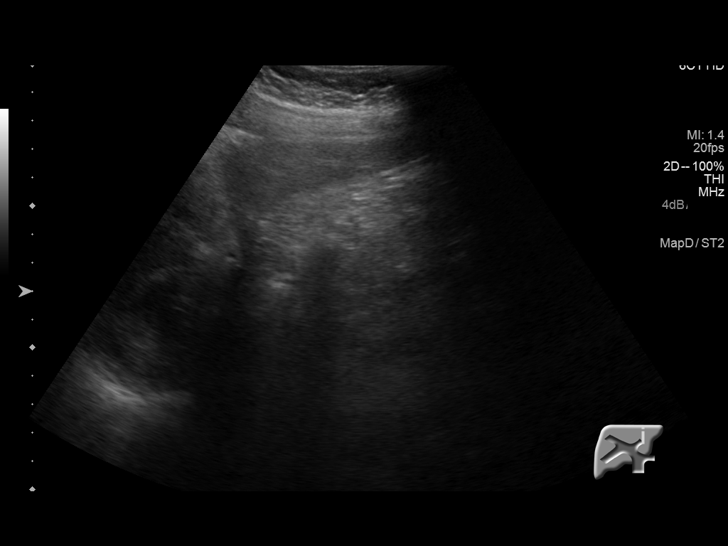
[im 19/55]
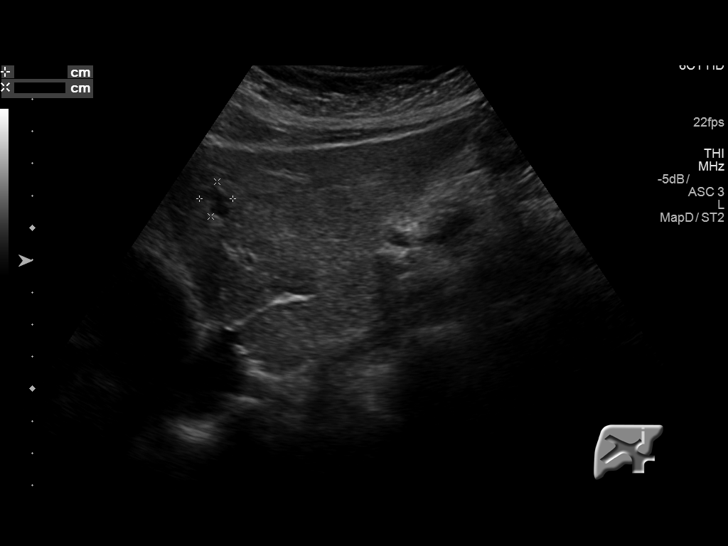
[im 21/55]
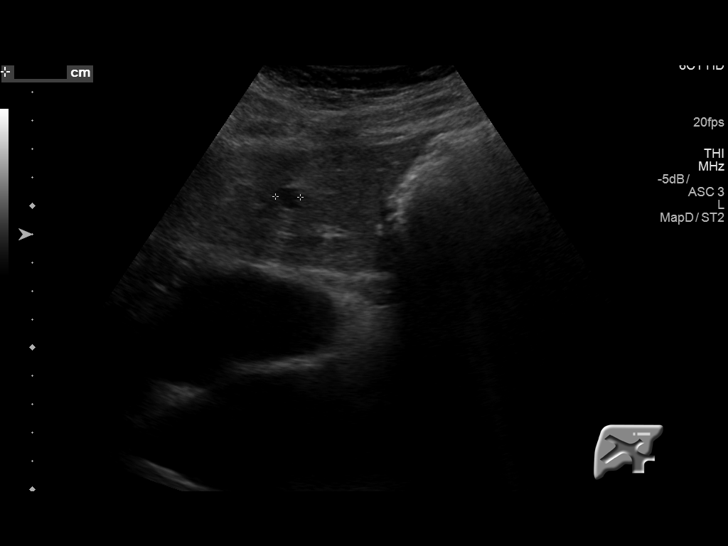
[im 25/55]
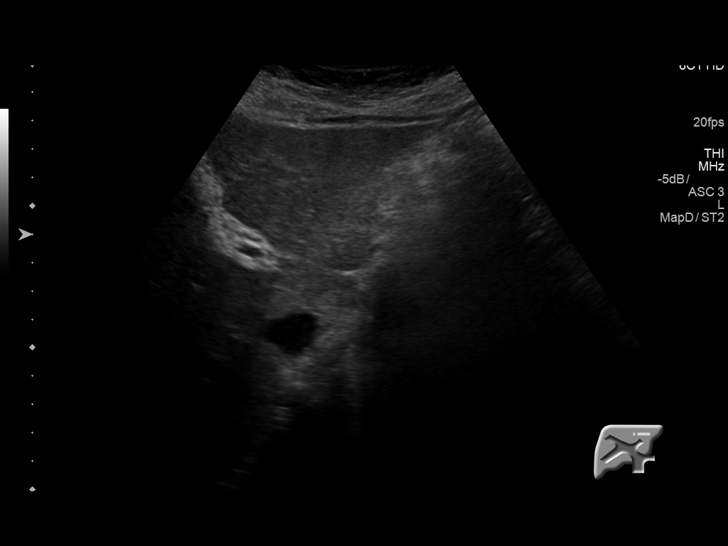
[im 30/55]
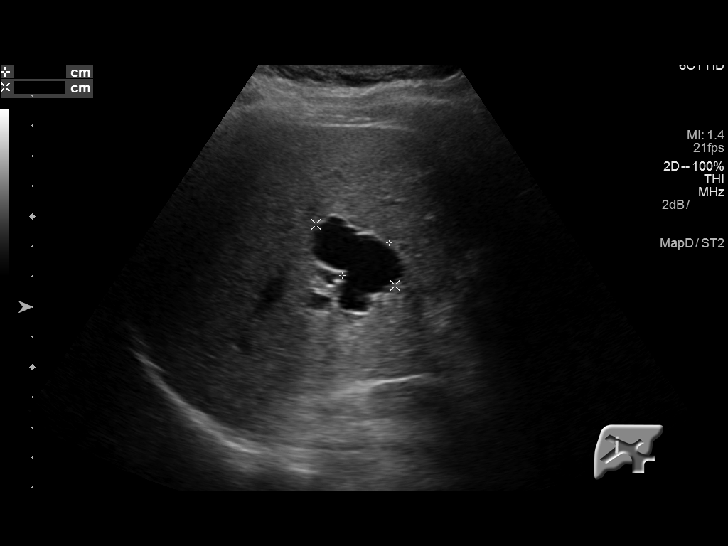
[im 34/55]
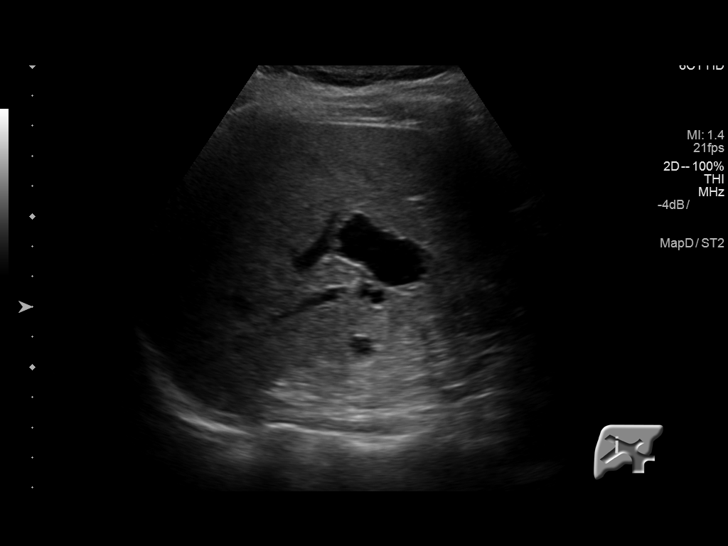
[im 37/55]
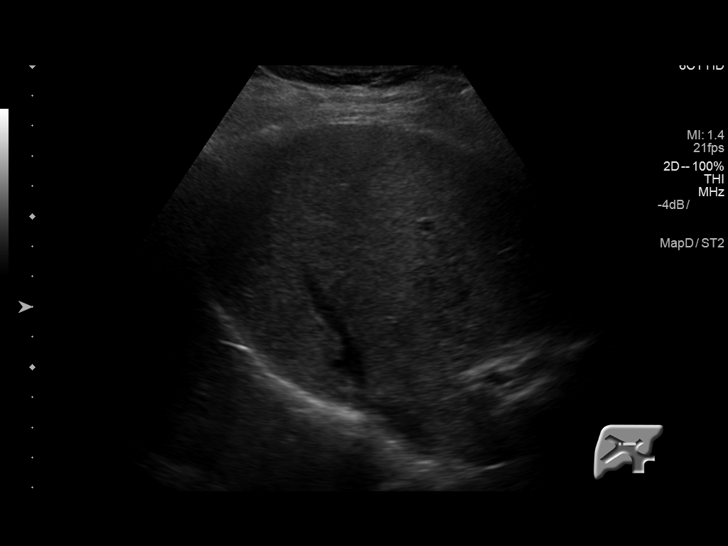
[im 41/55]
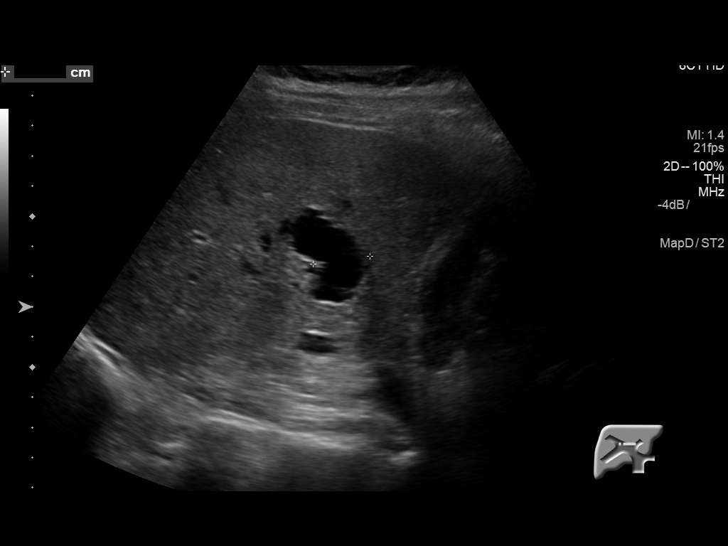
[im 46/55]
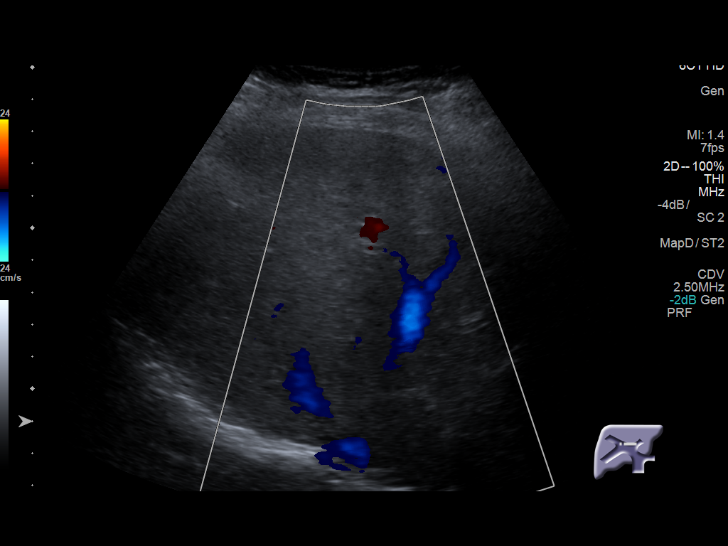
[im 50/55]
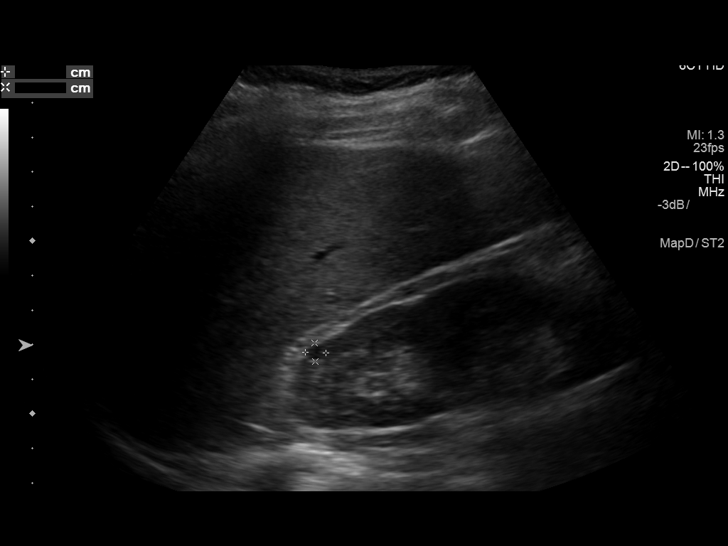
[im 55/55]
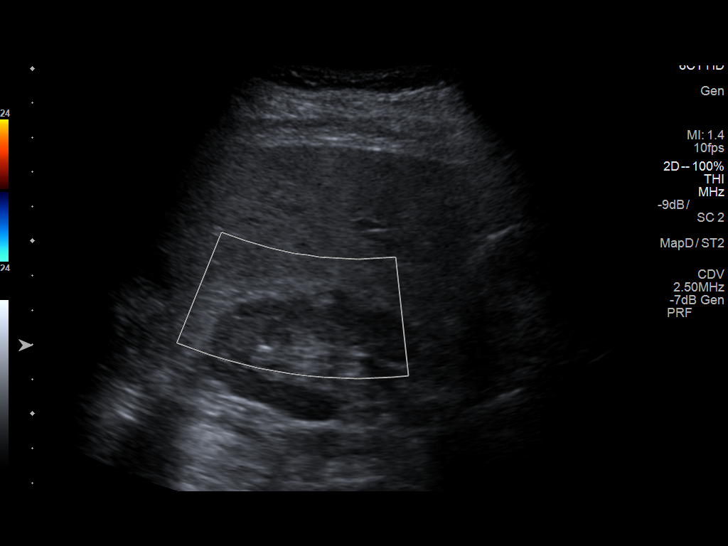

[14 of 25 positions shown; findings below may reference images not displayed]

FINDINGS: Gallbladder:

No gallstones or wall thickening visualized. No sonographic Murphy
sign noted by sonographer.

Common bile duct:

Diameter: 2 mm

Liver:

1.1 cm simple cyst left hepatic lobe. 3.3 cm simple cyst right
hepatic lobe. Similar findings noted on prior CT. Normal hepatic
echogenicity. Portal vein is patent on color Doppler imaging with
normal direction of blood flow towards the liver.

Incidental note made of a 7 mm simple cyst right kidney.
IMPRESSION: 1. Simple cysts again noted in the liver. Incidental note is made of
a tiny simple cyst in the right kidney.

2.  No acute abnormalities.  No gallstones or biliary distention.

## 2018-09-25 ENCOUNTER — Other Ambulatory Visit: Payer: Self-pay | Admitting: Family Medicine

## 2018-10-01 ENCOUNTER — Telehealth: Payer: Self-pay | Admitting: Gastroenterology

## 2018-10-01 DIAGNOSIS — R6881 Early satiety: Secondary | ICD-10-CM

## 2018-10-01 DIAGNOSIS — R110 Nausea: Secondary | ICD-10-CM

## 2018-10-01 DIAGNOSIS — R630 Anorexia: Secondary | ICD-10-CM

## 2018-10-01 DIAGNOSIS — K219 Gastro-esophageal reflux disease without esophagitis: Secondary | ICD-10-CM

## 2018-10-01 NOTE — Telephone Encounter (Addendum)
I spoke to pt and she said the Dexilant was not helping at all. York SpanielSaid it actually made her worse. Tobi Bastosnna, please advise!

## 2018-10-01 NOTE — Telephone Encounter (Signed)
Interesting. Let's have her go back to Protonix BID. We need to get her in sooner than what she is scheduled. What exactly are her symptoms? Could be something else going on.

## 2018-10-01 NOTE — Telephone Encounter (Signed)
[id number], patient said the samples did not work and she needs something else.

## 2018-10-02 NOTE — Telephone Encounter (Signed)
LMOM to call.

## 2018-10-02 NOTE — Telephone Encounter (Signed)
If she has early satiety, decreased appetite, nausea, and GERD exacerbation, we can't exclude possibility of delayed gastric emptying. Let's order GES.

## 2018-10-02 NOTE — Telephone Encounter (Signed)
LMOM for a return call.  

## 2018-10-02 NOTE — Telephone Encounter (Signed)
PT is aware and will go back on Protonix bid. Said she does not hurt anywhere, but she does not have any appetite, she is eating like a bird. Says her Bm's are OK.  She does have some nausea even when she doesn't eat.

## 2018-10-03 NOTE — Addendum Note (Signed)
Addended by: Sandria SenterBOOTH, Ray Glacken C on: 10/03/2018 12:13 PM   Modules accepted: Orders

## 2018-10-03 NOTE — Telephone Encounter (Signed)
REVIEWED-NO ADDITIONAL RECOMMENDATIONS. 

## 2018-10-03 NOTE — Telephone Encounter (Signed)
Pt is aware, OK to schedule the GES.

## 2018-10-03 NOTE — Telephone Encounter (Signed)
GES scheduled for 10/10/18 at 8:00am, arrive at 7:45am. NPO after midnight and no stomach medication the morning of test. Laura RossettiRyan advised it will be ok for her to have mammogram that's already scheduled while she is doing GES. Called and informed pt of appt. Letter mailed.

## 2018-10-03 NOTE — Telephone Encounter (Signed)
GES approved via [employer] website. PA# [id number], 10/03/18-11/17/18.

## 2018-10-10 ENCOUNTER — Ambulatory Visit (HOSPITAL_COMMUNITY)
Admission: RE | Admit: 2018-10-10 | Discharge: 2018-10-10 | Disposition: A | Discharge status: Home / Self Care | Payer: Medicare Other | Source: Ambulatory Visit | Attending: Family Medicine | Admitting: Family Medicine

## 2018-10-10 ENCOUNTER — Encounter (HOSPITAL_COMMUNITY): Payer: Self-pay

## 2018-10-10 ENCOUNTER — Encounter (HOSPITAL_COMMUNITY)
Admission: RE | Admit: 2018-10-10 | Discharge: 2018-10-10 | Disposition: A | Discharge status: Home / Self Care | Payer: Medicare Other | Source: Ambulatory Visit | Attending: Gastroenterology | Admitting: Gastroenterology

## 2018-10-10 DIAGNOSIS — K219 Gastro-esophageal reflux disease without esophagitis: Secondary | ICD-10-CM | POA: Insufficient documentation

## 2018-10-10 DIAGNOSIS — R110 Nausea: Secondary | ICD-10-CM | POA: Insufficient documentation

## 2018-10-10 DIAGNOSIS — R6881 Early satiety: Secondary | ICD-10-CM | POA: Diagnosis not present

## 2018-10-10 DIAGNOSIS — Z1231 Encounter for screening mammogram for malignant neoplasm of breast: Secondary | ICD-10-CM

## 2018-10-10 DIAGNOSIS — R630 Anorexia: Secondary | ICD-10-CM | POA: Insufficient documentation

## 2018-10-10 IMAGING — MG DIGITAL SCREENING BILATERAL MAMMOGRAM WITH TOMO AND CAD
8 series · 9 of 24 positions shown · non-contrast
Comparison: Previous exam(s).

CLINICAL DATA: Screening.

EXAM:
DIGITAL SCREENING BILATERAL MAMMOGRAM WITH TOMO AND CAD

[L MLO synth-2D]
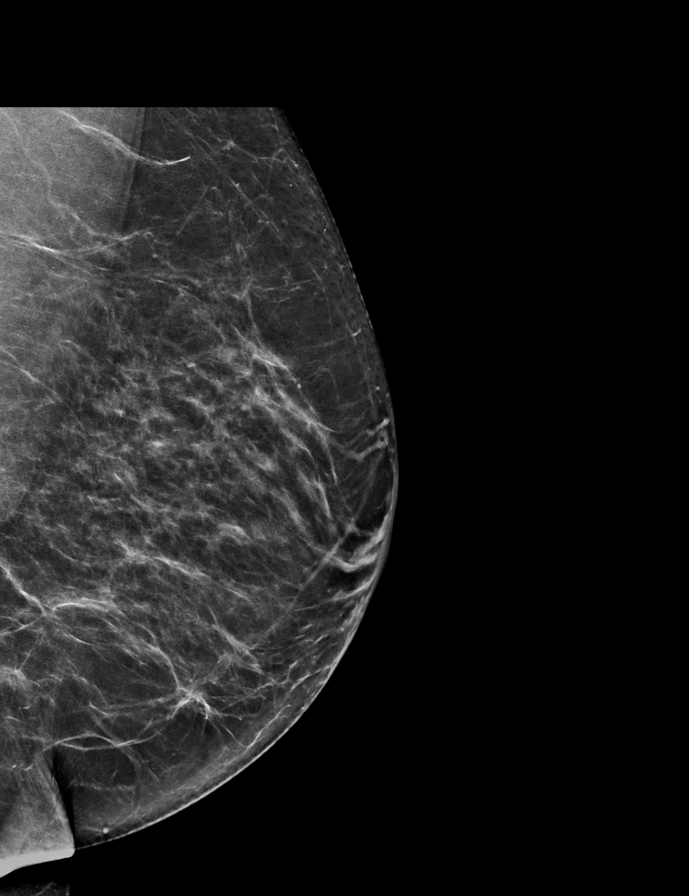

[R MLO synth-2D]
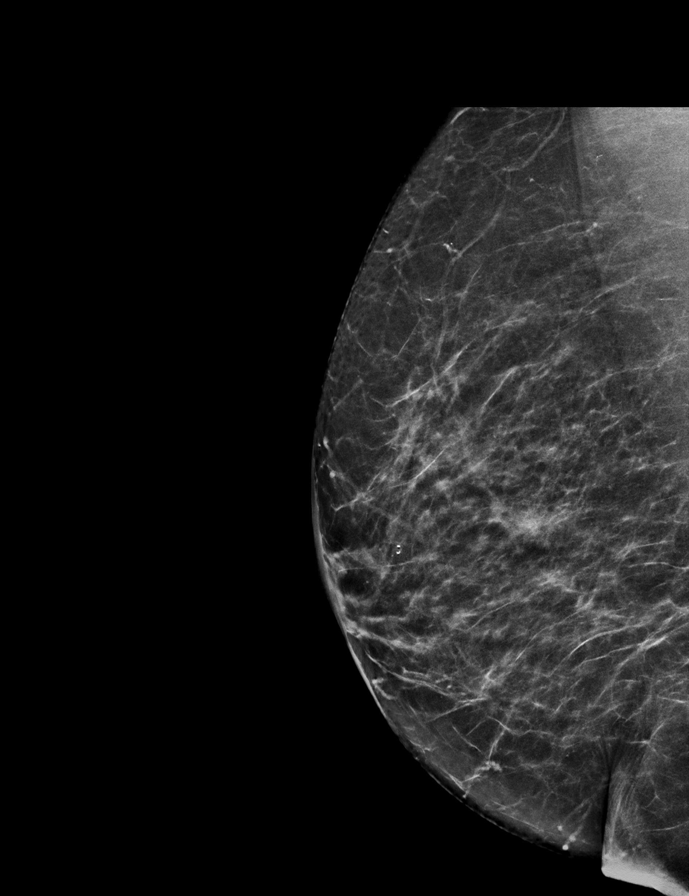

[R CC synth-2D]
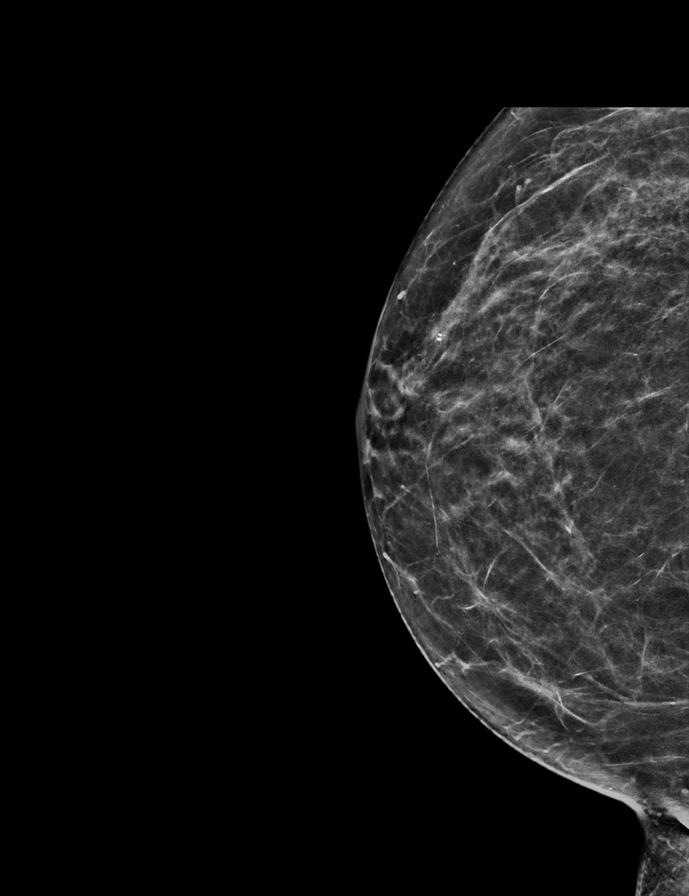

[L CC synth-2D]
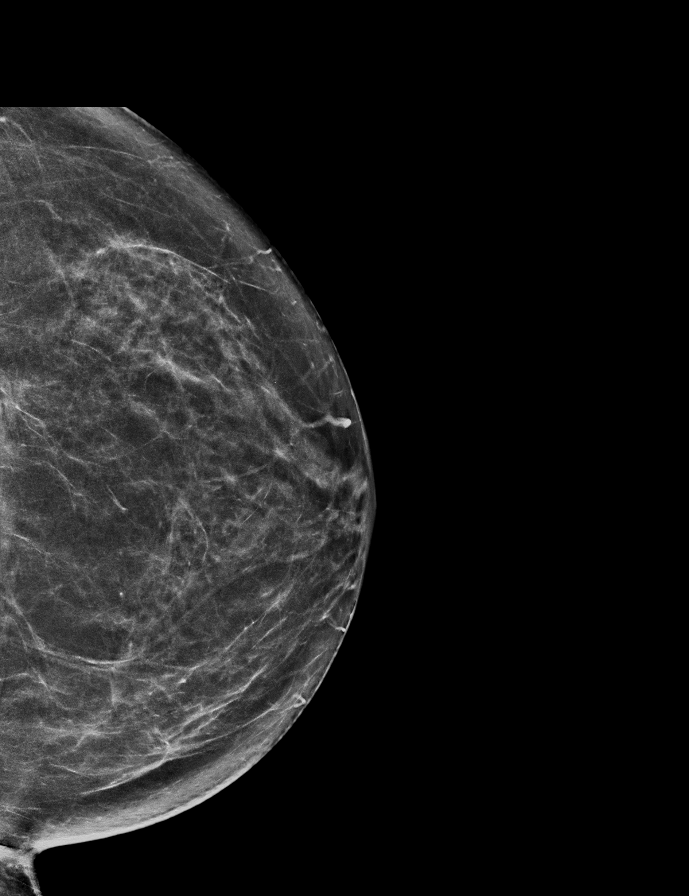

[L CC tomo · 2 of 66 frames shown]
[frame 22/66]
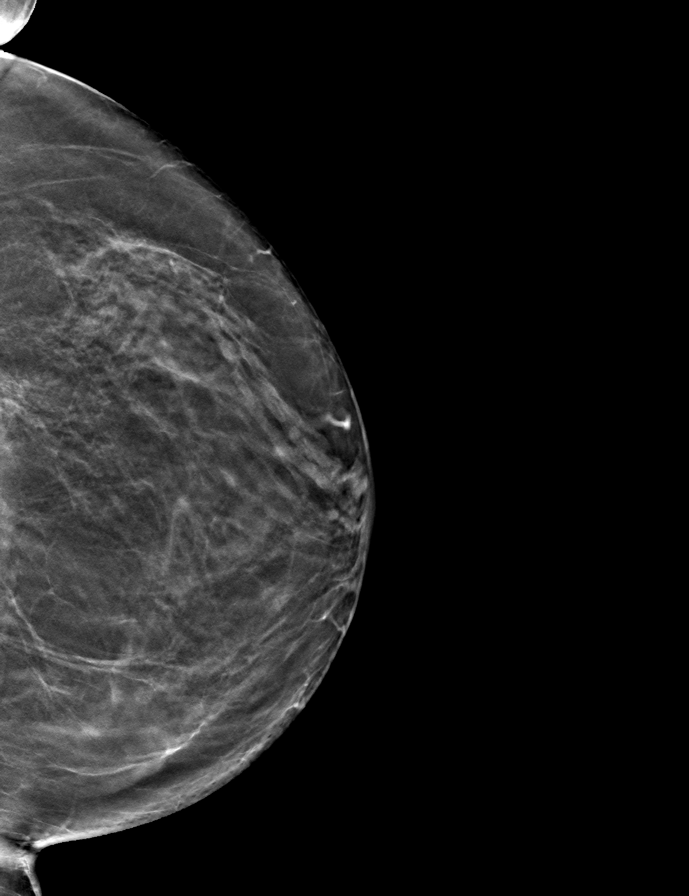
[frame 33/66]
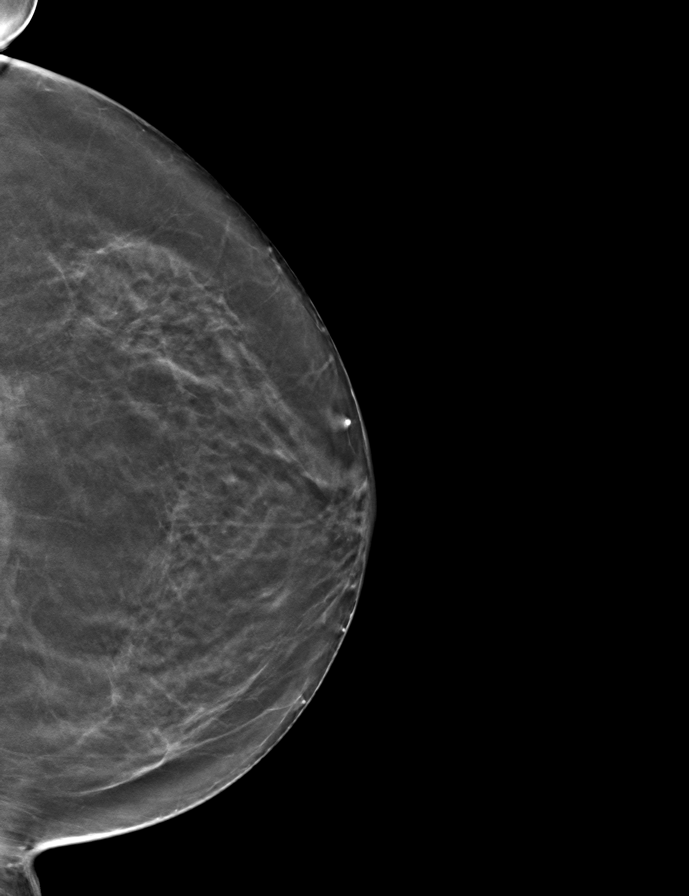

[R MLO tomo · tomo slice 34/67.0]
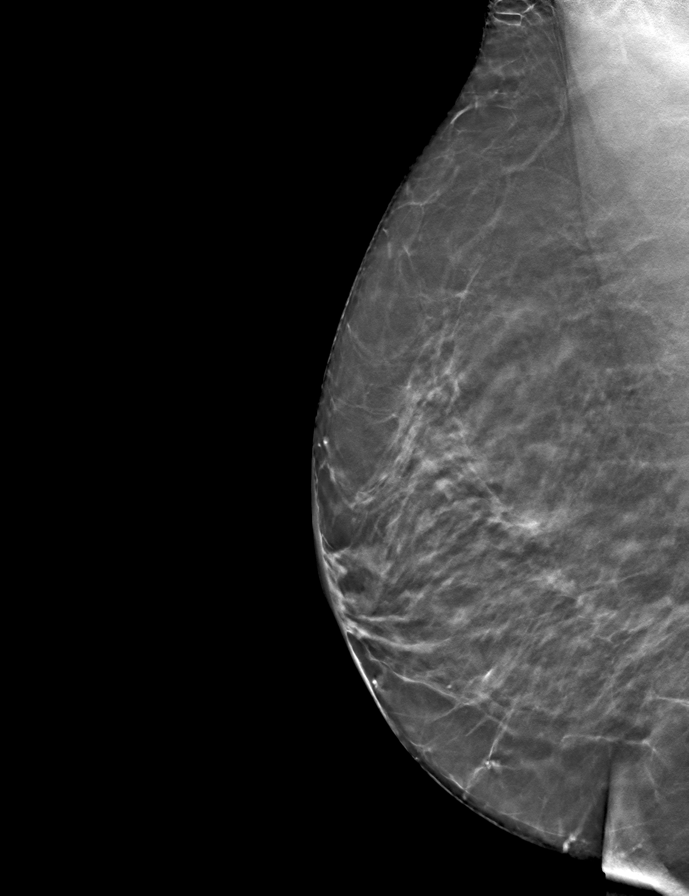

[R CC tomo · tomo slice 31/60.0]
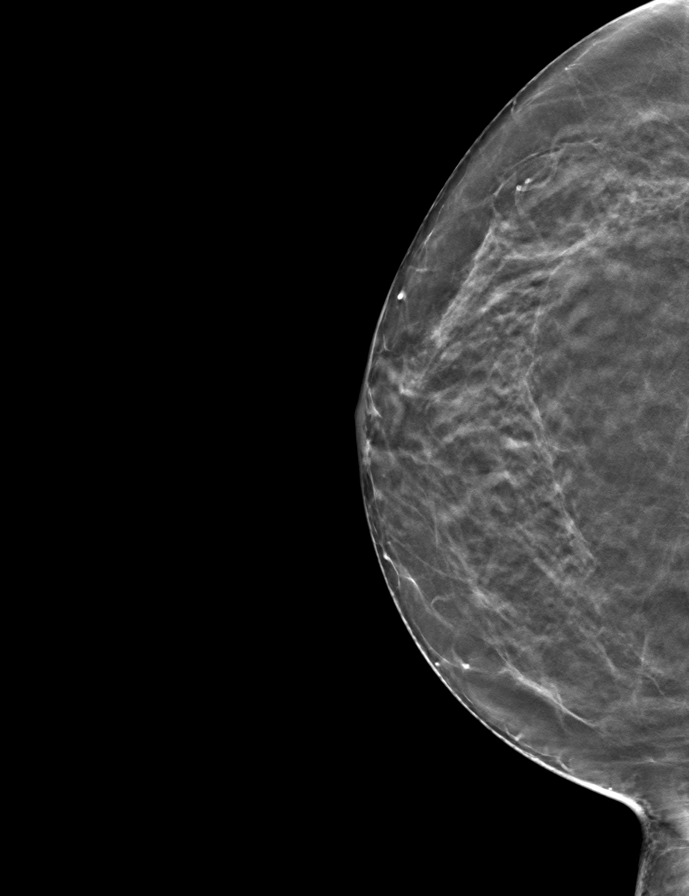

[L MLO tomo · tomo slice 33/65.0]
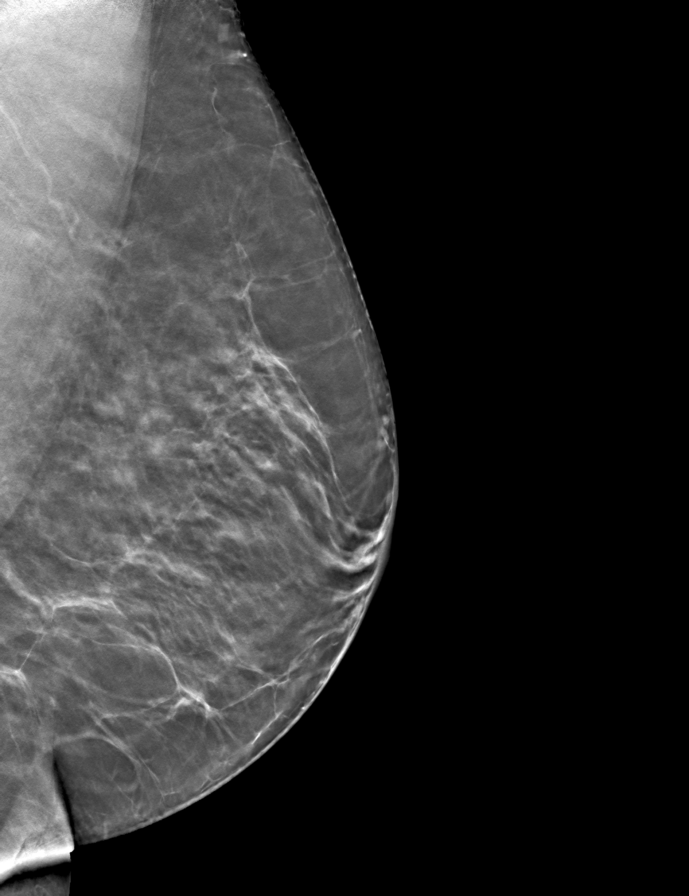

[9 of 24 positions shown; findings below may reference images not displayed]

ACR Breast Density Category b: There are scattered areas of
fibroglandular density.
FINDINGS: There are no findings suspicious for malignancy. Images were
processed with CAD.
IMPRESSION: No mammographic evidence of malignancy. A result letter of this
screening mammogram will be mailed directly to the patient.

RECOMMENDATION:
Screening mammogram in one year. (Code:[TQ])

BI-RADS CATEGORY  1: Negative.

## 2018-10-10 IMAGING — NM NM GASTRIC EMPTYING
8 series · 8 of 8 positions shown · non-contrast
Comparison: None.

CLINICAL DATA: Nausea with early satiety

EXAM:
NUCLEAR MEDICINE GASTRIC EMPTYING SCAN
TECHNIQUE: After oral ingestion of radiolabeled meal, sequential abdominal
images were obtained for 3 hours. Percentage of activity emptying
the stomach was calculated at 1 hour, 2 hours, and 3 hours
RADIOPHARMACEUTICALS:  2.0 mCi [WZ] sulfur colloid in standardized
meal including egg

[Series 1: 0 min · 4.14mm/px · 1 of 1 slices shown (1 of 2)]
[im 1/1]
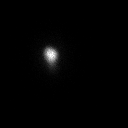

[Series 1: 0 min · 4.14mm/px · 1 of 1 slices shown (2 of 2)]
[im 1/1]
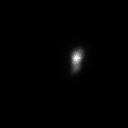

[Series 2: 60 min · 4.14mm/px · 1 of 1 slices shown (1 of 2)]
[im 1/1]
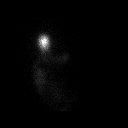

[Series 2: 60 min · 4.14mm/px · 1 of 1 slices shown (2 of 2)]
[im 1/1]
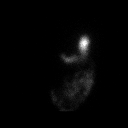

[Series 3: 120 min · 4.14mm/px · 1 of 1 slices shown (1 of 2)]
[im 1/1  full-range]
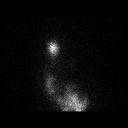

[Series 3: 120 min · 4.14mm/px · 1 of 1 slices shown (2 of 2)]
[im 1/1]
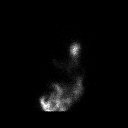

[Series 4: 180 min · 4.14mm/px · 1 of 1 slices shown (1 of 2)]
[im 1/1]
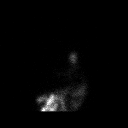

[Series 4: 180 min · 4.14mm/px · 1 of 1 slices shown (2 of 2)]
[im 1/1  full-range]
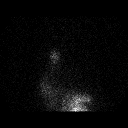

[8 of 8 positions shown; findings below may reference images not displayed]

FINDINGS: Expected location of the stomach in the left upper quadrant.
Ingested meal empties the stomach gradually over the course of the
study.

41.4% emptied at 1 hr ( normal >= 10%)

74.3% emptied at 2 hr ( normal >= 40%)

92.2% emptied at 3 hr ( normal >= 70%)

Normal greater than 90% emptying after 4 hours.)
IMPRESSION: Normal gastric emptying study.

## 2018-10-10 MED ORDER — TECHNETIUM TC 99M SULFUR COLLOID
2.0000 | Freq: Once | INTRAVENOUS | Status: AC | PRN
  Administered 2018-10-10: 2 via ORAL

## 2018-10-12 DIAGNOSIS — M25561 Pain in right knee: Secondary | ICD-10-CM | POA: Diagnosis not present

## 2018-10-12 DIAGNOSIS — Z471 Aftercare following joint replacement surgery: Secondary | ICD-10-CM | POA: Diagnosis not present

## 2018-10-12 DIAGNOSIS — M1711 Unilateral primary osteoarthritis, right knee: Secondary | ICD-10-CM | POA: Diagnosis not present

## 2018-10-12 DIAGNOSIS — Z96652 Presence of left artificial knee joint: Secondary | ICD-10-CM | POA: Diagnosis not present

## 2018-10-15 NOTE — Progress Notes (Signed)
GES is normal. Hopefully, she is doing better. Will see her in January.

## 2018-10-15 NOTE — Progress Notes (Signed)
Pt is aware and she is doing better.

## 2018-10-18 ENCOUNTER — Ambulatory Visit (INDEPENDENT_AMBULATORY_CARE_PROVIDER_SITE_OTHER): Payer: Medicare Other | Admitting: Family Medicine

## 2018-10-18 ENCOUNTER — Encounter: Payer: Self-pay | Admitting: Family Medicine

## 2018-10-18 VITALS — BP 116/60 | HR 61 | Resp 14 | Ht 64.0 in | Wt 164.1 lb

## 2018-10-18 DIAGNOSIS — Z0000 Encounter for general adult medical examination without abnormal findings: Secondary | ICD-10-CM

## 2018-10-18 DIAGNOSIS — F411 Generalized anxiety disorder: Secondary | ICD-10-CM

## 2018-10-18 MED ORDER — CITALOPRAM HYDROBROMIDE 20 MG PO TABS: 20.0000 mg | ORAL_TABLET | Freq: Every day | ORAL | 2 refills | Status: DC

## 2018-10-18 NOTE — Patient Instructions (Addendum)
F/u in 10 to 12 weeks, call if you need me before  Increase in citalopram dose to 20 mg daily  Tests are excellent, best for 2019, best wishes for you  And your family   Thank you  for choosing [facility]. We consider it a privelige to serve you.  Delivering excellent health care in a caring and  compassionate way is our goal.  Partnering with you,  so that together we can achieve this goal is our strategy.

## 2018-10-19 ENCOUNTER — Encounter: Payer: Self-pay | Admitting: Family Medicine

## 2018-10-19 NOTE — Assessment & Plan Note (Signed)
increased anxiety, resulting in poor appetite, increase dose of citalopram and review in 67 yo 12 weeks

## 2018-10-19 NOTE — Progress Notes (Signed)
    Skip EstimableJudy C Morain     MRN: [id number]      DOB: 07-Aug-1951  HPI: Patient is in for annual physical exam. C/o poor appetite  With weight loss over past approximately 5 months, then discusses her concern about her daughter  As far as relatioship and finances are concerned Recent labs, if available are reviewed. Immunization is reviewed , and  updated if needed.   PE: BP 116/60 (BP Location: Right Arm, Patient Position: Sitting, Cuff Size: Large)   Pulse 61   Resp 14   Ht 5\' 4"  (1.626 m)   Wt 164 lb 1.3 oz (74.4 kg)   SpO2 95% Comment: room air  BMI 28.16 kg/m   Pleasant  female, alert and oriented x 3, in no cardio-pulmonary distress. Afebrile. HEENT No facial trauma or asymetry. Sinuses non tender.  Extra occullar muscles intact, pupils equally reactive to light. External ears normal, tympanic membranes clear. Oropharynx moist, no exudate. Neck: supple, no adenopathy,JVD or thyromegaly.No bruits.  Chest: Clear to ascultation bilaterally.No crackles or wheezes. Non tender to palpation  Breast: No asymetry,no masses or lumps. No tenderness. No nipple discharge or inversion. No axillary or supraclavicular adenopathy  Cardiovascular system; Heart sounds normal,  S1 and  S2 ,no S3.  No murmur, or thrill. Apical beat not displaced Peripheral pulses normal.  Abdomen: Soft, non tender, no organomegaly or masses. No bruits. Bowel sounds normal. No guarding, tenderness or rebound.   Musculoskeletal exam: Full ROM of spine, hips , shoulders and knees. No deformity ,swelling or crepitus noted. No muscle wasting or atrophy.   Neurologic: Cranial nerves 2 to 12 intact. Power, tone ,sensation and reflexes normal throughout. No disturbance in gait. No tremor.  Skin: Intact, no ulceration, erythema , scaling or rash noted. Pigmentation normal throughout  Psych; Normal mood and affect. Judgement and concentration normal   Assessment & Plan:  Annual physical  exam Annual exam as documented. Counseling done  re healthy lifestyle involving commitment to 150 minutes exercise per week, heart healthy diet, and attaining healthy weight.The importance of adequate sleep also discussed. Regular seat belt use and home safety, is also discussed. Changes in health habits are decided on by the patient with goals and time frames  set for achieving them. Immunization and cancer screening needs are specifically addressed at this visit.   GAD (generalized anxiety disorder) increased anxiety, resulting in poor appetite, increase dose of citalopram and review in 67 yo 12 weeks

## 2018-10-19 NOTE — Assessment & Plan Note (Signed)

## 2018-10-27 ENCOUNTER — Other Ambulatory Visit: Payer: Self-pay | Admitting: Family Medicine

## 2018-10-31 ENCOUNTER — Other Ambulatory Visit: Payer: Self-pay | Admitting: Family Medicine

## 2018-11-06 ENCOUNTER — Encounter: Payer: Self-pay | Admitting: Family Medicine

## 2018-11-06 ENCOUNTER — Ambulatory Visit (INDEPENDENT_AMBULATORY_CARE_PROVIDER_SITE_OTHER): Payer: Medicare Other | Admitting: Family Medicine

## 2018-11-06 VITALS — BP 120/66 | HR 62 | Temp 99.3°F | Resp 12 | Ht 64.0 in | Wt 164.1 lb

## 2018-11-06 DIAGNOSIS — I10 Essential (primary) hypertension: Secondary | ICD-10-CM

## 2018-11-06 DIAGNOSIS — J3089 Other allergic rhinitis: Secondary | ICD-10-CM | POA: Diagnosis not present

## 2018-11-06 DIAGNOSIS — J040 Acute laryngitis: Secondary | ICD-10-CM | POA: Diagnosis not present

## 2018-11-06 DIAGNOSIS — F411 Generalized anxiety disorder: Secondary | ICD-10-CM

## 2018-11-06 DIAGNOSIS — J029 Acute pharyngitis, unspecified: Secondary | ICD-10-CM | POA: Diagnosis not present

## 2018-11-06 MED ORDER — PREDNISONE 5 MG (21) PO TBPK: 5.0000 mg | ORAL_TABLET | ORAL | 0 refills | Status: DC

## 2018-11-06 MED ORDER — METHYLPREDNISOLONE ACETATE 80 MG/ML IJ SUSP
80.0000 mg | Freq: Once | INTRAMUSCULAR | Status: AC
  Administered 2018-11-06: 80 mg via INTRAMUSCULAR

## 2018-11-06 NOTE — Progress Notes (Signed)
   Laura [name]     MRN: [id number]      DOB: December 04, 1950   HPI Laura [name] is here with a 2 day h/o acute loss of voice , and increased post nasal drainage , denies fever and chills Excess cough and sneezing ROS  Denies chest pains, palpitations and leg swelling Denies abdominal pain, nausea, vomiting,diarrhea or constipation.   Denies dysuria, frequency, hesitancy or incontinence. Denies joint pain, swelling and limitation in mobility. Denies headaches, seizures, numbness, or tingling. Denies depression, anxiety or insomnia. Denies skin break down or rash.   PE  BP 120/66 (BP Location: Left Arm, Patient Position: Sitting, Cuff Size: Large)   Pulse 62   Temp 99.3 F (37.4 C) (Oral)   Resp 12   Ht 5\' 4"  (1.626 m)   Wt 164 lb 1.9 oz (74.4 kg)   SpO2 96% Comment: room air  BMI 28.17 kg/m   Patient alert and oriented and in no cardiopulmonary distress.  HEENT: No facial asymmetry, EOMI,   oropharynx pink and moist.  Neck supple no JVD, no mass.Nasal mucosa erythematous and edematous, TM clear  Chest: Clear to auscultation bilaterally.  CVS: S1, S2 no murmurs, no S3.Regular rate.  ABD: Soft non tender.   Ext: No edema  MS: Adequate ROM spine, shoulders, hips and knees.  Skin: Intact, no ulcerations or rash noted.  Psych: Good eye contact, normal affect. Memory intact not anxious or depressed appearing.  CNS: CN 2-12 intact, power,  normal throughout.no focal deficits noted.   Assessment & Plan  Acute laryngitis Voice rest, salt water gargles, nhoney and lime, and pred x 6 dayas  Allergic rhinitis Uncontrolled, depo medrol 80 mg iM  Sore throat Rapid strep test is negative  Essential hypertension Controlled, no change in medication DASH diet and commitment to daily physical activity for a minimum of 30 minutes discussed and encouraged, as a part of hypertension management. The importance of attaining a healthy weight is also discussed.  BP/Weight 11/06/2018  10/18/2018 08/15/2018 07/30/2018 07/11/2018 06/26/2018 06/19/2018  Systolic BP 120 116 120 128 135 120 124  Diastolic BP 66 60 70 80 80 80 71  Wt. (Lbs) 164.12 164.08 172 171 172 172 170.6  BMI 28.17 28.16 28.62 29.35 29.52 29.52 29.28

## 2018-11-06 NOTE — Patient Instructions (Addendum)
F/U as before , call if you need me sooner  You do not have Strep throat.  You are treated for laryngitis, voice rest and prednisone for 6 days  Your allergies are uncontrolled depo medrol 80 mg IM I office today

## 2018-11-06 NOTE — Assessment & Plan Note (Signed)
Rapid strep test is negative.

## 2018-11-06 NOTE — Assessment & Plan Note (Signed)
Voice rest, salt water gargles, nhoney and lime, and pred x 6 dayas

## 2018-11-06 NOTE — Assessment & Plan Note (Signed)
Uncontrolled, depo medrol 80 mg iM

## 2018-11-11 ENCOUNTER — Encounter: Payer: Self-pay | Admitting: Family Medicine

## 2018-11-11 NOTE — Assessment & Plan Note (Signed)
Controlled, no change in medication  

## 2018-11-11 NOTE — Assessment & Plan Note (Signed)
Controlled, no change in medication DASH diet and commitment to daily physical activity for a minimum of 30 minutes discussed and encouraged, as a part of hypertension management. The importance of attaining a healthy weight is also discussed.  BP/Weight 11/06/2018 10/18/2018 08/15/2018 07/30/2018 07/11/2018 06/26/2018 06/19/2018  Systolic BP 120 116 120 128 135 120 124  Diastolic BP 66 60 70 80 80 80 71  Wt. (Lbs) 164.12 164.08 172 171 172 172 170.6  BMI 28.17 28.16 28.62 29.35 29.52 29.52 29.28

## 2018-11-22 ENCOUNTER — Ambulatory Visit (INDEPENDENT_AMBULATORY_CARE_PROVIDER_SITE_OTHER): Payer: Medicare Other | Admitting: Gastroenterology

## 2018-11-22 ENCOUNTER — Encounter: Payer: Self-pay | Admitting: Gastroenterology

## 2018-11-22 VITALS — BP 140/81 | HR 67 | Temp 98.4°F | Ht 64.0 in | Wt 170.4 lb

## 2018-11-22 DIAGNOSIS — K219 Gastro-esophageal reflux disease without esophagitis: Secondary | ICD-10-CM | POA: Diagnosis not present

## 2018-11-22 DIAGNOSIS — K5900 Constipation, unspecified: Secondary | ICD-10-CM

## 2018-11-22 NOTE — Assessment & Plan Note (Signed)
Historically has been on Linzess 72 mcg every other day. I feel she could do better with Linzess once daily, and we discussed trying this if she feels stools not productive. Gas handout provided due to concerns of increased flatulence. Dietary intake playing a role. Return in 6 months.

## 2018-11-22 NOTE — Progress Notes (Signed)
CC'D TO PCP °

## 2018-11-22 NOTE — Assessment & Plan Note (Signed)
Doing well now with Protonix BID. Discussed dietary/behavior modifications. ENT evaluated last year. GES normal. Return in 6 months.

## 2018-11-22 NOTE — Progress Notes (Signed)
Referring Provider: Kerri Perches, MD Primary Care Physician:  Kerri Perches, MD  Primary GI: Dr. Darrick Penna   Chief Complaint  Patient presents with  . Follow-up  . Hemorrhoids    Pt was consitpated and states the BM was very large. she used her supository and she felt a pain in her stomach. pt also reports her abdomen felt hot.     HPI:   Laura [name] is a 68 y.o. female presenting today with a history of constipation and GERD. Last colonoscopy in 2016. EGD 2018with gastritis, normal small bowel biopsies, multiple gastric polyps. Underwent capsule due to anemia, revealing mild gastritis. She has seen hematology with evaluation. CT completed Feb 2019 and reviewed with radiology: no aortic atherosclerosis, celiac and SMA widely patent without any stenosis at the origin, IMA also open. Not dealing with chronic mesenteric ischemia. GES in interim from last visit was normal.   Constipation: Linzess every other day prn. States over the weekend she had cramping, abdomen felt hot. Recently had a hard stool and had a time trying to "get that fella out". Had rectal pain after. Better now. Had been on a lot of OTC meds due to sinuses. No rectal bleeding. Feels like something is hanging down on butt. Doesn't want me to look at it. Just knows it is there. Walks and poots. Eats broccoli, beans, cabbage.   GERD: Protonix BID. Controlling symptoms well.   Past Medical History:  Diagnosis Date  . Anxiety   . Cystitis   . Depression   . Gastritis   . GERD (gastroesophageal reflux disease)   . Hashimoto's thyroiditis    Hx   . Hyperlipidemia   . Hypertension   . Hypothyroidism   . Normocytic anemia 2009 Hb 9.9-11.1  . Osteopenia   . Shoulder pain   . Urinary incontinence     Past Surgical History:  Procedure Laterality Date  . Bilateral tubal ligation    . BREAST EXCISIONAL BIOPSY  2010   Excisional biopsy of benign left breast mass -lopoma   . BREAST SURGERY     left nreast  biopsy  . COLONOSCOPY  08/26/2011   SLF: 1. Internal hemorrhoids  . COLONOSCOPY N/A 10/06/2015   IEP:PIRJ fissure or internal hemorrhoids/mild sigmoid colitis. benign colonic path   . colonscopy  2005   Dr. Katrinka Blazing  . ESOPHAGOGASTRODUODENOSCOPY  12/2009   chronic gastritis  . ESOPHAGOGASTRODUODENOSCOPY N/A 05/17/2017   Dr. Darrick Penna; gastritis, normal small bowel biopsies, multiple gastric polyps. fundic gland polyps  . GIVENS CAPSULE STUDY N/A 05/30/2017   gastritis, no source for anemia identified  . left knee surgery Left March 1,2017  . PARTIAL KNEE ARTHROPLASTY Left 08/09/2017   Procedure: LEFT UNICOMPARTMENTAL KNEE;  Surgeon: Ollen Gross, MD;  Location: [facility];  Service: Orthopedics;  Laterality: Left;  . Resection of left lobe of thyroid    . right carpal tunnel release    . rt. knee athroscopy  2004  . SAVORY DILATION N/A 05/17/2017   Procedure: SAVORY DILATION;  Surgeon: West Bali, MD;  Location: [facility];  Service: Endoscopy;  Laterality: N/A;  . TOTAL ABDOMINAL HYSTERECTOMY  1994  . UMBILICAL HERNIA REPAIR    . Urethral dilation for stenosis  2009    Current Outpatient Medications  Medication Sig Dispense Refill  . acetaminophen (TYLENOL) 500 MG tablet Take 1,000 mg by mouth every 8 (eight) hours as needed (for pain.).     Marland Kitchen amLODipine (NORVASC) 2.5 MG tablet  TAKE 1 TABLET(2.5 MG) BY MOUTH DAILY 30 tablet 3  . atorvastatin (LIPITOR) 20 MG tablet TAKE 1 TABLET BY MOUTH  DAILY 90 tablet 1  . citalopram (CELEXA) 20 MG tablet Take 1 tablet (20 mg total) by mouth daily. 90 tablet 2  . gabapentin (NEURONTIN) 100 MG capsule Take 1 capsule (100 mg total) by mouth at bedtime. 30 capsule 4  . hydrochlorothiazide (HYDRODIURIL) 25 MG tablet TAKE 1 TABLET BY MOUTH  DAILY 90 tablet 1  . levothyroxine (SYNTHROID, LEVOTHROID) 112 MCG tablet TAKE 1 TABLET(112 MCG) BY MOUTH DAILY BEFORE BREAKFAST 30 tablet 5  . linaclotide (LINZESS) 72 MCG capsule Take 1 capsule (72 mcg total) by  mouth daily before breakfast. (Patient taking differently: Take 72 mcg by mouth every other day. ) 90 capsule 3  . loratadine (CLARITIN) 10 MG tablet Take 1 tablet (10 mg total) by mouth daily. (Patient taking differently: Take 10 mg by mouth daily as needed for allergies. ) 30 tablet 3  . pantoprazole (PROTONIX) 40 MG tablet TAKE 1 TABLET BY MOUTH TWO  TIMES DAILY 180 tablet 1  . Polyethyl Glycol-Propyl Glycol (SYSTANE) 0.4-0.3 % SOLN Apply 1 drop to eye 3 (three) times daily as needed (for dry eyes.).    Marland Kitchen. potassium chloride (K-DUR) 10 MEQ tablet Take 1 tablet (10 mEq total) by mouth 2 (two) times daily. 60 tablet 2  . traMADol (ULTRAM) 50 MG tablet One tablet at bedtime, as needed, for uncontrolled knee pain 30 tablet 3   No current facility-administered medications for this visit.     Allergies as of 11/22/2018 - Review Complete 11/22/2018  Allergen Reaction Noted  . Cozaar [losartan potassium] Cough 07/04/2018  . Dilaudid [hydromorphone hcl] Other (See Comments) 03/01/2011  . Levaquin [levofloxacin] Other (See Comments) 07/28/2014  . Nsaids Other (See Comments) 01/13/2010  . Promethazine  05/17/2017    Family History  Problem Relation Age of Onset  . Pneumonia Mother   . Colon cancer Neg Hx   . Anesthesia problems Neg Hx   . Hypotension Neg Hx   . Malignant hyperthermia Neg Hx   . Pseudochol deficiency Neg Hx     Social History   Socioeconomic History  . Marital status: Married    Spouse name: Not on file  . Number of children: 3  . Years of education: Not on file  . Highest education level: Not on file  Occupational History  . 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 Financial resource strain: Not on file  . Food insecurity:    Worry: Not on file    Inability: Not on file  . Transportation needs:    Medical: Not on file    Non-medical: Not on file  Tobacco Use  . Smoking status: Former Smoker    Packs/day: 0.25    Years: 1.00     Pack years: 0.25    Types: Cigarettes    Last attempt to quit: 08/25/1972    Years since quitting: 46.2  . Smokeless tobacco: Never Used  Substance and Sexual Activity  . Alcohol use: No  . Drug use: No  . Sexual activity: Yes    Birth control/protection: Surgical    Comment: married  Lifestyle  . Physical activity:    Days per week: Not on file    Minutes per session: Not on file  . Stress: Not on file  Relationships  . Social connections:    Talks on phone: Not on file  Gets together: Not on file    Attends religious service: Not on file    Active member of club or organization: Not on file    Attends meetings of clubs or organizations: Not on file    Relationship status: Not on file  Other Topics Concern  . Not on file  Social History Narrative  . Not on file    Review of Systems: Gen: Denies fever, chills, anorexia. Denies fatigue, weakness, weight loss.  CV: Denies chest pain, palpitations, syncope, peripheral edema, and claudication. Resp: Denies dyspnea at rest, cough, wheezing, coughing up blood, and pleurisy. GI: see HPI Derm: Denies rash, itching, dry skin Psych: Denies depression, anxiety, memory loss, confusion. No homicidal or suicidal ideation.  Heme: Denies bruising, bleeding, and enlarged lymph nodes.  Physical Exam: BP 140/81   Pulse 67   Temp 98.4 F (36.9 C) (Oral)   Ht 5\' 4"  (1.626 m)   Wt 170 lb 6.4 oz (77.3 kg)   BMI 29.25 kg/m  General:   Alert and oriented. No distress noted. Pleasant and cooperative.  Head:  Normocephalic and atraumatic. Eyes:  Conjuctiva clear without scleral icterus. Mouth:  Oral mucosa pink and moist. Good dentition. No lesions. Abdomen:  +BS, soft, non-tender and non-distended. No rebound or guarding. No HSM or masses noted.' Rectal exam declined by patient Msk:  Symmetrical without gross deformities. Normal posture. Extremities:  Without edema. Neurologic:  Alert and  oriented x4 Psych:  Alert and cooperative.  Normal mood and affect.

## 2018-11-22 NOTE — Patient Instructions (Addendum)
Continue Protonix twice a day, 30 minutes before breakfast and dinner.  Continue Linzess every other day to daily if needed. Avoid straining.   I have included a handout on foods that cause gas and bloating.   We will see you in 6 months!  I enjoyed seeing you again today! As you know, I value our relationship and want to provide genuine, compassionate, and quality care. I welcome your feedback. If you receive a survey regarding your visit,  I greatly appreciate you taking time to fill this out. See you next time!  Gelene Mink, PhD, ANP-BC [facility]

## 2018-11-28 ENCOUNTER — Other Ambulatory Visit: Payer: Self-pay | Admitting: Family Medicine

## 2018-12-20 ENCOUNTER — Ambulatory Visit: Payer: Medicare Other | Admitting: Gastroenterology

## 2018-12-24 ENCOUNTER — Ambulatory Visit (INDEPENDENT_AMBULATORY_CARE_PROVIDER_SITE_OTHER): Payer: Medicare Other | Admitting: Family Medicine

## 2018-12-24 ENCOUNTER — Encounter: Payer: Self-pay | Admitting: Family Medicine

## 2018-12-24 VITALS — BP 130/82 | HR 71 | Temp 98.8°F | Resp 12 | Ht 64.0 in | Wt 169.0 lb

## 2018-12-24 DIAGNOSIS — J3089 Other allergic rhinitis: Secondary | ICD-10-CM | POA: Diagnosis not present

## 2018-12-24 DIAGNOSIS — I10 Essential (primary) hypertension: Secondary | ICD-10-CM

## 2018-12-24 DIAGNOSIS — G4489 Other headache syndrome: Secondary | ICD-10-CM | POA: Diagnosis not present

## 2018-12-24 DIAGNOSIS — E785 Hyperlipidemia, unspecified: Secondary | ICD-10-CM | POA: Diagnosis not present

## 2018-12-24 MED ORDER — PREDNISONE 5 MG PO TABS: ORAL_TABLET | ORAL | 0 refills | Status: AC

## 2018-12-24 MED ORDER — BUTALBITAL-APAP-CAFFEINE 50-325-40 MG PO TABS: ORAL_TABLET | ORAL | 0 refills | Status: DC

## 2018-12-24 MED ORDER — METHYLPREDNISOLONE ACETATE 80 MG/ML IJ SUSP
80.0000 mg | Freq: Once | INTRAMUSCULAR | Status: AC
  Administered 2018-12-24: 80 mg via INTRAMUSCULAR

## 2018-12-24 NOTE — Progress Notes (Signed)
   Laura [name]     MRN: [id number]      DOB: April 22, 1951   HPI Laura [name] is here with a 1 week h/o disabling frontal headache rated at a 10, constant, no relief, keeps her awake and wakens her,no medication has helped.no nausea or vomiting constant pounding  Increased tickle of throat  And watery nasal drainage,feels light headed with moving quickly since today   ROS Denies recent fever or chills. . Denies chest congestion, productive cough or wheezing. Denies chest pains, palpitations and leg swelling Denies abdominal pain, nausea, vomiting,diarrhea or constipation.   Denies dysuria, frequency, hesitancy or incontinence. Denies joint pain, swelling and limitation in mobility. Denies, seizures, numbness, or tingling. Denies depression, anxiety or insomnia. Denies skin break down or rash.   PE  BP 130/82   Pulse 71   Temp 98.8 F (37.1 C)   Resp 12   Ht 5\' 4"  (1.626 m)   Wt 169 lb (76.7 kg)   SpO2 97% Comment: room air  BMI 29.01 kg/m   Patient alert and oriented and in no cardiopulmonary distress.  HEENT: No facial asymmetry, EOMI,   oropharynx pink and moist.  Neck supple no JVD, no mass.Nsal mucosa erythematous and edematous.no retinal hemmorhage, clear disc margins  Chest: Clear to auscultation bilaterally.  CVS: S1, S2 no murmurs, no S3.Regular rate.  ABD: Soft non tender.   Ext: No edema  MS: Adequate ROM spine, shoulders, hips and knees.  Skin: Intact, no ulcerations or rash noted.  Psych: Good eye contact, normal affect. Memory intact not anxious or depressed appearing.  CNS: CN 2-12 intact, power,  normal throughout.no focal deficits noted.   Assessment & Plan  Headache Depo Medrol 80 mg Im , followed by pred 5 mg modified dose pack  Essential hypertension Controlled, no change in medication DASH diet and commitment to daily physical activity for a minimum of 30 minutes discussed and encouraged, as a part of hypertension management. The  importance of attaining a healthy weight is also discussed.  BP/Weight 12/24/2018 11/22/2018 11/06/2018 10/18/2018 08/15/2018 07/30/2018 07/11/2018  Systolic BP 130 140 120 116 120 128 135  Diastolic BP 82 81 66 60 70 80 80  Wt. (Lbs) 169 170.4 164.12 164.08 172 171 172  BMI 29.01 29.25 28.17 28.16 28.62 29.35 29.52       Hyperlipidemia LDL goal <100 Hyperlipidemia:Low fat diet discussed and encouraged.   Lipid Panel  Lab Results  Component Value Date   CHOL 164 04/02/2018   HDL 45 (L) 04/02/2018   LDLCALC 101 (H) 04/02/2018   TRIG 88 04/02/2018   CHOLHDL 3.6 04/02/2018       Allergic rhinitis Uncontrolled, short course of oral prednisone and daily commitment to prescription medication, depo Medrol also administered IM

## 2018-12-24 NOTE — Patient Instructions (Signed)
F/U in March as before, call by Wednesday of this week if your headache is still present , then I will send you for a scan of your brain  Your headache is from uncontrolled allergy symptoms causing head pressure Depo Medrol 80  Mg IM in office today. Prednisone and fioricet are prescribed for headache  Please commit to daily claritin  Thank you  for choosing [facility]. We consider it a privelige to serve you.  Delivering excellent health care in a caring and  compassionate way is our goal.  Partnering with you,  so that together we can achieve this goal is our strategy.

## 2018-12-24 NOTE — Assessment & Plan Note (Signed)
Depo Medrol 80 mg Im , followed by pred 5 mg modified dose pack

## 2018-12-26 ENCOUNTER — Encounter: Payer: Self-pay | Admitting: Family Medicine

## 2018-12-26 NOTE — Assessment & Plan Note (Signed)
Controlled, no change in medication DASH diet and commitment to daily physical activity for a minimum of 30 minutes discussed and encouraged, as a part of hypertension management. The importance of attaining a healthy weight is also discussed.  BP/Weight 12/24/2018 11/22/2018 11/06/2018 10/18/2018 08/15/2018 07/30/2018 07/11/2018  Systolic BP 130 140 120 116 120 128 135  Diastolic BP 82 81 66 60 70 80 80  Wt. (Lbs) 169 170.4 164.12 164.08 172 171 172  BMI 29.01 29.25 28.17 28.16 28.62 29.35 29.52

## 2018-12-26 NOTE — Assessment & Plan Note (Signed)
Uncontrolled, short course of oral prednisone and daily commitment to prescription medication, depo Medrol also administered IM

## 2018-12-26 NOTE — Assessment & Plan Note (Signed)
Hyperlipidemia:Low fat diet discussed and encouraged.   Lipid Panel  Lab Results  Component Value Date   CHOL 164 04/02/2018   HDL 45 (L) 04/02/2018   LDLCALC 101 (H) 04/02/2018   TRIG 88 04/02/2018   CHOLHDL 3.6 04/02/2018

## 2019-01-01 DIAGNOSIS — E890 Postprocedural hypothyroidism: Secondary | ICD-10-CM | POA: Diagnosis not present

## 2019-01-01 LAB — T4, FREE: Free T4: 1.2 ng/dL (ref 0.8–1.8)

## 2019-01-01 LAB — TSH: TSH: 1.66 m[IU]/L (ref 0.40–4.50)

## 2019-01-08 ENCOUNTER — Ambulatory Visit (INDEPENDENT_AMBULATORY_CARE_PROVIDER_SITE_OTHER): Payer: Medicare Other | Admitting: Family Medicine

## 2019-01-08 ENCOUNTER — Encounter: Payer: Self-pay | Admitting: Family Medicine

## 2019-01-08 VITALS — BP 130/82 | HR 68 | Resp 15 | Ht 64.0 in | Wt 170.0 lb

## 2019-01-08 DIAGNOSIS — E785 Hyperlipidemia, unspecified: Secondary | ICD-10-CM | POA: Diagnosis not present

## 2019-01-08 DIAGNOSIS — I10 Essential (primary) hypertension: Secondary | ICD-10-CM | POA: Diagnosis not present

## 2019-01-08 DIAGNOSIS — J3089 Other allergic rhinitis: Secondary | ICD-10-CM

## 2019-01-08 DIAGNOSIS — N3945 Continuous leakage: Secondary | ICD-10-CM

## 2019-01-08 DIAGNOSIS — F411 Generalized anxiety disorder: Secondary | ICD-10-CM | POA: Diagnosis not present

## 2019-01-08 NOTE — Patient Instructions (Addendum)
WELLNESS WITH NURSE OCT 1 OR AFTER  PHYSICAL EXAM WIT MD December 20 OR AFTER   CBC, LIPID, CMP AND EGFR FASTING ON 01/10/2019 OR AFTER  PLEASE TURN OFF TV WHEN SLEEEPING SO YOU WILL BE WELL RESTED AND FALL ASLEEP LESS I THE DAY. YOU HAVE ALREADY FIGURED TO START DANCING OR MOVING   Thanks for choosing [facility], we consider it a privelige to serve you.

## 2019-01-09 ENCOUNTER — Other Ambulatory Visit: Payer: Self-pay

## 2019-01-09 ENCOUNTER — Encounter: Payer: Self-pay | Admitting: "Endocrinology

## 2019-01-09 ENCOUNTER — Ambulatory Visit (INDEPENDENT_AMBULATORY_CARE_PROVIDER_SITE_OTHER): Payer: Medicare Other | Admitting: "Endocrinology

## 2019-01-09 VITALS — BP 135/65 | HR 60 | Ht 64.0 in | Wt 168.0 lb

## 2019-01-09 DIAGNOSIS — E890 Postprocedural hypothyroidism: Secondary | ICD-10-CM

## 2019-01-09 MED ORDER — LEVOTHYROXINE SODIUM 112 MCG PO TABS: ORAL_TABLET | ORAL | 4 refills | Status: DC

## 2019-01-09 NOTE — Progress Notes (Signed)
   Endocrinology follow-up note  Complaint: Follow-up for hypothyroidism  HPI  Laura [name] is a 68 y.o.-year-old female,  She has  medical history of goiter status post left hemithyroidectomy 20+ years ago, unremarkable ultrasound in 2012.     She is currently on levothyroxine 112 mcg p.o. nightly.  She is compliant to her medications.  She has a steady body weight.   -  She denies any heat/cold intolerance, palpitations, tremors.    Pt denies feeling nodules in neck, hoarseness, dysphagia/odynophagia, SOB with lying down.  No history of radiation exposure to her head and neck.   No recent use of iodine supplements.   ROS: Constitutional: + steady weight , no fatigue, no subjective hyperthermia nor hypothermia.   Eyes: no blurry vision, no xerophthalmia ENT: no sore throat, no nodules palpated in throat, no dysphagia/odynophagia, no hoarseness Cardiovascular: No chest pain, no shortness of breath, no palpitations.    Skin: no rashes Neurological: No tremors, no numbness, no tingling.   Psychiatric: no depression/anxiety  PE: BP 135/65   Pulse 60   Ht 5\' 4"  (1.626 m)   Wt 168 lb (76.2 kg)   BMI 28.84 kg/m  Wt Readings from Last 3 Encounters:  01/09/19 168 lb (76.2 kg)  01/08/19 170 lb (77.1 kg)  12/24/18 169 lb (76.7 kg)   Constitutional: overweight, not in acute distress, stable state of mind.    Eyes: PERRLA, EOMI, no exophthalmos ENT: moist mucous membranes, no thyromegaly, no cervical lymphadenopathy  Musculoskeletal: no deformities, strength intact in all 4 Skin: moist, warm, no rashes Neurological: No tremors of outstretched hands.    Recent Results (from the past 2160 hour(s))  TSH     Status: None   Collection Time: 01/01/19 10:08 AM  Result Value Ref Range   TSH 1.66 0.40 - 4.50 mIU/L  T4, free     Status: None   Collection Time: 01/01/19 10:08 AM  Result Value Ref Range   Free T4 1.2 0.8 - 1.8 ng/dL   ASSESSMENT: 1. Hypothyroidism,  postsurgical  PLAN:  -Her previsit thyroid function tests are consistent with appropriate replacement.   -She is advised to continue levothyroxine 112 mcg p.o. every morning.     - We discussed about the correct intake of her thyroid hormone, on empty stomach at fasting, with water, separated by at least 30 minutes from breakfast and other medications,  and separated by more than 4 hours from calcium, iron, multivitamins, acid reflux medications (PPIs). -Patient is made aware of the fact that thyroid hormone replacement is needed for life, dose to be adjusted by periodic monitoring of thyroid function tests.   -Her screening bone density from October 2019 was normal.     Marquis Lunch, MD Phone: [phone]  Fax: [phone]   01/09/2019, 1:25 PM

## 2019-01-10 DIAGNOSIS — I10 Essential (primary) hypertension: Secondary | ICD-10-CM | POA: Diagnosis not present

## 2019-01-10 DIAGNOSIS — E785 Hyperlipidemia, unspecified: Secondary | ICD-10-CM | POA: Diagnosis not present

## 2019-01-10 LAB — CBC
HCT: 30.9 % — ABNORMAL LOW (ref 35.0–45.0)
Hemoglobin: 10.2 g/dL — ABNORMAL LOW (ref 11.7–15.5)
MCH: 28.7 pg (ref 27.0–33.0)
MCHC: 33 g/dL (ref 32.0–36.0)
MCV: 87 fL (ref 80.0–100.0)
MPV: 10.1 fL (ref 7.5–12.5)
Platelets: 253 10*3/uL (ref 140–400)
RBC: 3.55 10*6/uL — AB (ref 3.80–5.10)
RDW: 11.5 % (ref 11.0–15.0)
WBC: 4.2 10*3/uL (ref 3.8–10.8)

## 2019-01-10 LAB — COMPLETE METABOLIC PANEL WITH GFR
AG Ratio: 1.5 (calc) (ref 1.0–2.5)
ALBUMIN MSPROF: 4.1 g/dL (ref 3.6–5.1)
ALKALINE PHOSPHATASE (APISO): 72 U/L (ref 37–153)
ALT: 22 U/L (ref 6–29)
AST: 18 U/L (ref 10–35)
BUN / CREAT RATIO: 13 (calc) (ref 6–22)
BUN: 13 mg/dL (ref 7–25)
CO2: 26 mmol/L (ref 20–32)
Calcium: 9.7 mg/dL (ref 8.6–10.4)
Chloride: 105 mmol/L (ref 98–110)
Creat: 1.02 mg/dL — ABNORMAL HIGH (ref 0.50–0.99)
GFR, Est African American: 65 mL/min/{1.73_m2} (ref >=60)
GFR, Est Non African American: 56 mL/min/{1.73_m2} — ABNORMAL LOW (ref >=60)
Globulin: 2.8 g/dL (calc) (ref 1.9–3.7)
Glucose, Bld: 91 mg/dL (ref 65–99)
Potassium: 3.6 mmol/L (ref 3.5–5.3)
Sodium: 138 mmol/L (ref 135–146)
TOTAL PROTEIN: 6.9 g/dL (ref 6.1–8.1)
Total Bilirubin: 0.4 mg/dL (ref 0.2–1.2)

## 2019-01-10 LAB — LIPID PANEL
Cholesterol: 184 mg/dL (ref <200)
HDL: 57 mg/dL (ref >=50)
LDL Cholesterol (Calc): 108 mg/dL (calc) — ABNORMAL HIGH
Non-HDL Cholesterol (Calc): 127 mg/dL (calc) (ref <130)
Total CHOL/HDL Ratio: 3.2 (calc) (ref <5.0)
Triglycerides: 98 mg/dL (ref <150)

## 2019-01-12 ENCOUNTER — Encounter: Payer: Self-pay | Admitting: Family Medicine

## 2019-01-12 NOTE — Assessment & Plan Note (Signed)
Hyperlipidemia:Low fat diet discussed and encouraged.   Lipid Panel  Lab Results  Component Value Date   CHOL 184 01/10/2019   HDL 57 01/10/2019   LDLCALC 108 (H) 01/10/2019   TRIG 98 01/10/2019   CHOLHDL 3.2 01/10/2019  uncontrolled Need to reduce fried and fatty foods

## 2019-01-12 NOTE — Assessment & Plan Note (Signed)
Controlled, no change in medication  

## 2019-01-12 NOTE — Assessment & Plan Note (Signed)
Controlled, no change in medication DASH diet and commitment to daily physical activity for a minimum of 30 minutes discussed and encouraged, as a part of hypertension management. The importance of attaining a healthy weight is also discussed.  BP/Weight 01/09/2019 01/08/2019 12/24/2018 11/22/2018 11/06/2018 10/18/2018 08/15/2018  Systolic BP 135 130 130 140 120 116 120  Diastolic BP 65 82 82 81 66 60 70  Wt. (Lbs) 168 170 169 170.4 164.12 164.08 172  BMI 28.84 29.18 29.01 29.25 28.17 28.16 28.62

## 2019-01-12 NOTE — Progress Notes (Signed)
   Laura [name]     MRN: [id number]      DOB: 04-28-1951   HPI Laura [name] is here for follow up and re-evaluation of chronic medical conditions, medication management and review of any available recent lab and radiology data.  Preventive health is updated, specifically  Cancer screening and Immunization.   Questions or concerns regarding consultations or procedures which the PT has had in the interim are  addressed. The PT denies any adverse reactions to current medications since the last visit.  There are no new concerns.  There are no specific complaints   ROS Denies recent fever or chills. Denies sinus pressure, nasal congestion, ear pain or sore throat. Denies chest congestion, productive cough or wheezing. Denies chest pains, palpitations and leg swelling Denies abdominal pain, nausea, vomiting,diarrhea or constipation.   Denies dysuria, frequency, hesitancy or incontinence. Denies joint pain, swelling and limitation in mobility. Denies headaches, seizures, numbness, or tingling. Denies depression, anxiety or insomnia. Denies skin break down or rash.   PE  BP 130/82   Pulse 68   Resp 15   Ht 5\' 4"  (1.626 m)   Wt 170 lb (77.1 kg)   SpO2 98%   BMI 29.18 kg/m   Patient alert and oriented and in no cardiopulmonary distress.  HEENT: No facial asymmetry, EOMI,   oropharynx pink and moist.  Neck supple no JVD, no mass.  Chest: Clear to auscultation bilaterally.  CVS: S1, S2 no murmurs, no S3.Regular rate.  ABD: Soft non tender.   Ext: No edema  MS: Adequate ROM spine, shoulders, hips and knees.  Skin: Intact, no ulcerations or rash noted.  Psych: Good eye contact, normal affect. Memory intact not anxious or depressed appearing.  CNS: CN 2-12 intact, power,  normal throughout.no focal deficits noted.   Assessment & Plan  Essential hypertension Controlled, no change in medication DASH diet and commitment to daily physical activity for a minimum of 30 minutes  discussed and encouraged, as a part of hypertension management. The importance of attaining a healthy weight is also discussed.  BP/Weight 01/09/2019 01/08/2019 12/24/2018 11/22/2018 11/06/2018 10/18/2018 08/15/2018  Systolic BP 135 130 130 140 120 116 120  Diastolic BP 65 82 82 81 66 60 70  Wt. (Lbs) 168 170 169 170.4 164.12 164.08 172  BMI 28.84 29.18 29.01 29.25 28.17 28.16 28.62       Hyperlipidemia LDL goal <100 Hyperlipidemia:Low fat diet discussed and encouraged.   Lipid Panel  Lab Results  Component Value Date   CHOL 184 01/10/2019   HDL 57 01/10/2019   LDLCALC 108 (H) 01/10/2019   TRIG 98 01/10/2019   CHOLHDL 3.2 01/10/2019  uncontrolled Need to reduce fried and fatty foods      Urinary incontinence Controlled, no change in medication   GAD (generalized anxiety disorder) Controlled, no change in medication   Allergic rhinitis Controlled, no change in medication   \

## 2019-01-14 ENCOUNTER — Encounter: Payer: Self-pay | Admitting: Family Medicine

## 2019-02-02 ENCOUNTER — Other Ambulatory Visit: Payer: Self-pay | Admitting: Family Medicine

## 2019-03-02 ENCOUNTER — Other Ambulatory Visit: Payer: Self-pay | Admitting: Family Medicine

## 2019-03-15 ENCOUNTER — Encounter: Payer: Self-pay | Admitting: *Deleted

## 2019-03-21 DIAGNOSIS — M25562 Pain in left knee: Secondary | ICD-10-CM | POA: Diagnosis not present

## 2019-03-21 DIAGNOSIS — M1711 Unilateral primary osteoarthritis, right knee: Secondary | ICD-10-CM | POA: Diagnosis not present

## 2019-04-15 ENCOUNTER — Other Ambulatory Visit: Payer: Self-pay

## 2019-04-15 MED ORDER — PANTOPRAZOLE SODIUM 40 MG PO TBEC: 40.0000 mg | DELAYED_RELEASE_TABLET | Freq: Two times a day (BID) | ORAL | 0 refills | Status: DC

## 2019-04-15 MED ORDER — CITALOPRAM HYDROBROMIDE 20 MG PO TABS: 20.0000 mg | ORAL_TABLET | Freq: Every day | ORAL | 0 refills | Status: DC

## 2019-04-25 ENCOUNTER — Other Ambulatory Visit: Payer: Self-pay | Admitting: Family Medicine

## 2019-05-23 ENCOUNTER — Encounter: Payer: Self-pay | Admitting: Nurse Practitioner

## 2019-05-23 ENCOUNTER — Other Ambulatory Visit: Payer: Self-pay

## 2019-05-23 ENCOUNTER — Ambulatory Visit (INDEPENDENT_AMBULATORY_CARE_PROVIDER_SITE_OTHER): Payer: Medicare Other | Admitting: Nurse Practitioner

## 2019-05-23 VITALS — BP 139/77 | HR 69 | Temp 98.4°F | Ht 64.0 in | Wt 176.2 lb

## 2019-05-23 DIAGNOSIS — K5900 Constipation, unspecified: Secondary | ICD-10-CM | POA: Diagnosis not present

## 2019-05-23 DIAGNOSIS — K219 Gastro-esophageal reflux disease without esophagitis: Secondary | ICD-10-CM | POA: Diagnosis not present

## 2019-05-23 DIAGNOSIS — R1084 Generalized abdominal pain: Secondary | ICD-10-CM

## 2019-05-23 DIAGNOSIS — R109 Unspecified abdominal pain: Secondary | ICD-10-CM | POA: Insufficient documentation

## 2019-05-23 NOTE — Assessment & Plan Note (Signed)
Symptoms well managed on as needed Linzess.  Continue current medications and follow-up in 6 months.

## 2019-05-23 NOTE — Progress Notes (Signed)
Referring Provider: Kerri PerchesSimpson, Margaret E, MD Primary Care Physician:  Kerri PerchesSimpson, Margaret E, MD Primary GI:  Dr. Darrick PennaFields  Chief Complaint  Patient presents with  . Diarrhea    2 episodes yesterday  . Abdominal Pain    "cramping"    HPI:   [name] is a 68 y.o. female who presents for follow-up on GERD and constipation.  Patient last seen in our office 11/22/2018 for the same.  Chronic history of both.  Colonoscopy up-to-date 2016.  Somewhat remote EGD in 2018 with gastritis, normal small bowel biopsies, multiple gastric polyps and underwent a capsule due to anemia revealing mild gastritis.  She has had a hematology evaluation.  CT in February 2019 with no aortic atherosclerosis, celiac and SMA widely patent without any stenosis in the origin, IMA also open.  Overall no evidence of chronic mesenteric ischemia.  GES also completed and normal.  At her last visit GERD was doing well on Protonix twice daily in controlling her symptoms.  With constipation taking Linzess every other day as needed.  Recent bout of significant constipation difficulty passing stool with rectal pain.  No rectal bleeding.  Sensation of something "hanging down on my butt" but declined a rectal exam."  Recommended continue Protonix as prescribed, continue Linzess every other day as needed and avoid straining, education on gas causing foods provided.  Follow-up in 6 months.  Today she states she's doing ok overall. Thinks she may have had a virus or food poisoning because yesterday she had abdominal cramping and multiple multiple loose stools. Still with some cramping today. Prior to that, bowels doing well on Linzess. GERD doing well on Protonix. Denies N/V, hematochezia, melena, fever, chills, unintentional weight loss. Denies URI or flu-like symptoms. Denies loss of sense of taste or smell. Denies chest pain, dyspnea, dizziness, lightheadedness, syncope, near syncope. Denies any other upper or lower GI symptoms.   Past  Medical History:  Diagnosis Date  . Anxiety   . Cystitis   . Depression   . Gastritis   . GERD (gastroesophageal reflux disease)   . Hashimoto's thyroiditis    Hx   . Hyperlipidemia   . Hypertension   . Hypothyroidism   . Normocytic anemia 2009 Hb 9.9-11.1  . Osteopenia   . Shoulder pain   . Urinary incontinence     Past Surgical History:  Procedure Laterality Date  . Bilateral tubal ligation    . BREAST EXCISIONAL BIOPSY  2010   Excisional biopsy of benign left breast mass -lopoma   . BREAST SURGERY     left nreast biopsy  . COLONOSCOPY  08/26/2011   SLF: 1. Internal hemorrhoids  . COLONOSCOPY N/A 10/06/2015   OZH:YQMVSLF:anal fissure or internal hemorrhoids/mild sigmoid colitis. benign colonic path   . colonscopy  2005   Dr. Katrinka BlazingSmith  . ESOPHAGOGASTRODUODENOSCOPY  12/2009   chronic gastritis  . ESOPHAGOGASTRODUODENOSCOPY N/A 05/17/2017   Dr. Darrick PennaFields; gastritis, normal small bowel biopsies, multiple gastric polyps. fundic gland polyps  . GIVENS CAPSULE STUDY N/A 05/30/2017   gastritis, no source for anemia identified  . left knee surgery Left March 1,2017  . PARTIAL KNEE ARTHROPLASTY Left 08/09/2017   Procedure: LEFT UNICOMPARTMENTAL KNEE;  Surgeon: Ollen GrossAluisio, Frank, MD;  Location: [facility];  Service: Orthopedics;  Laterality: Left;  . Resection of left lobe of thyroid    . right carpal tunnel release    . rt. knee athroscopy  2004  . SAVORY DILATION N/A 05/17/2017   Procedure: SAVORY DILATION;  Surgeon: West BaliFields, Sandi L, MD;  Location: [facility];  Service: Endoscopy;  Laterality: N/A;  . TOTAL ABDOMINAL HYSTERECTOMY  1994  . UMBILICAL HERNIA REPAIR    . Urethral dilation for stenosis  2009    Current Outpatient Medications  Medication Sig Dispense Refill  . acetaminophen (TYLENOL) 500 MG tablet Take 1,000 mg by mouth every 8 (eight) hours as needed (for pain.).     Marland Kitchen. amLODipine (NORVASC) 2.5 MG tablet TAKE 1 TABLET(2.5 MG) BY MOUTH DAILY 30 tablet 5  . atorvastatin (LIPITOR) 20  MG tablet TAKE 1 TABLET BY MOUTH  DAILY 90 tablet 1  . citalopram (CELEXA) 20 MG tablet Take 1 tablet (20 mg total) by mouth daily. 90 tablet 0  . gabapentin (NEURONTIN) 100 MG capsule TAKE 1 CAPSULE(100 MG) BY MOUTH AT BEDTIME 30 capsule 4  . hydrochlorothiazide (HYDRODIURIL) 25 MG tablet TAKE 1 TABLET BY MOUTH  DAILY 90 tablet 1  . levothyroxine (SYNTHROID, LEVOTHROID) 112 MCG tablet TAKE 1 TABLET(112 MCG) BY MOUTH DAILY BEFORE BREAKFAST 90 tablet 4  . linaclotide (LINZESS) 72 MCG capsule Take 1 capsule (72 mcg total) by mouth daily before breakfast. (Patient taking differently: Take 72 mcg by mouth as needed. ) 90 capsule 3  . loratadine (CLARITIN) 10 MG tablet Take 1 tablet (10 mg total) by mouth daily. (Patient taking differently: Take 10 mg by mouth daily as needed for allergies. ) 30 tablet 3  . pantoprazole (PROTONIX) 40 MG tablet Take 1 tablet (40 mg total) by mouth 2 (two) times daily. 180 tablet 0  . Polyethyl Glycol-Propyl Glycol (SYSTANE) 0.4-0.3 % SOLN Apply 1 drop to eye 3 (three) times daily as needed (for dry eyes.).    Marland Kitchen. potassium chloride (K-DUR) 10 MEQ tablet Take 1 tablet (10 mEq total) by mouth 2 (two) times daily. 60 tablet 2  . traMADol (ULTRAM) 50 MG tablet TAKE 1 TABLET BY MOUTH EVERY NIGHT AT BEDTIME AS NEEDED FOR UNCONTROLLED KNEE PAIN 30 tablet 1   No current facility-administered medications for this visit.     Allergies as of 05/23/2019 - Review Complete 05/23/2019  Allergen Reaction Noted  . Cozaar [losartan potassium] Cough 07/04/2018  . Dilaudid [hydromorphone hcl] Other (See Comments) 03/01/2011  . Levaquin [levofloxacin] Other (See Comments) 07/28/2014  . Nsaids Other (See Comments) 01/13/2010  . Promethazine  05/17/2017    Family History  Problem Relation Age of Onset  . Pneumonia Mother   . Colon cancer Neg Hx   . Anesthesia problems Neg Hx   . Hypotension Neg Hx   . Malignant hyperthermia Neg Hx   . Pseudochol deficiency Neg Hx     Social  History   Socioeconomic History  . Marital status: Married    Spouse name: Not on file  . Number of children: 3  . Years of education: Not on file  . Highest education level: Not on file  Occupational History  . 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 Financial resource strain: Not on file  . Food insecurity    Worry: Not on file    Inability: Not on file  . Transportation needs    Medical: Not on file    Non-medical: Not on file  Tobacco Use  . Smoking status: Former Smoker    Packs/day: 0.25    Years: 1.00    Pack years: 0.25    Types: Cigarettes    Quit date: 08/25/1972    Years since quitting: 46.7  .  Smokeless tobacco: Never Used  Substance and Sexual Activity  . Alcohol use: No  . Drug use: No  . Sexual activity: Yes    Birth control/protection: Surgical    Comment: married  Lifestyle  . Physical activity    Days per week: Not on file    Minutes per session: Not on file  . Stress: Not on file  Relationships  . Social Herbalist on phone: Not on file    Gets together: Not on file    Attends religious service: Not on file    Active member of club or organization: Not on file    Attends meetings of clubs or organizations: Not on file    Relationship status: Not on file  Other Topics Concern  . Not on file  Social History Narrative  . Not on file    Review of Systems: Complete ROS negative except as per HPI.   Physical Exam: BP 139/77   Pulse 69   Temp 98.4 F (36.9 C) (Oral)   Ht 5\' 4"  (1.626 m)   Wt 176 lb 3.2 oz (79.9 kg)   BMI 30.24 kg/m  General:   Alert and oriented. Pleasant and cooperative. Well-nourished and well-developed.  Eyes:  Without icterus, sclera clear and conjunctiva pink.  Ears:  Normal auditory acuity. Cardiovascular:  S1, S2 present without murmurs appreciated. Extremities without clubbing or edema. Respiratory:  Clear to auscultation bilaterally. No wheezes, rales, or rhonchi. No  distress.  Gastrointestinal:  +BS, soft, non-tender and non-distended. No HSM noted. No guarding or rebound. No masses appreciated.  Rectal:  Deferred  Musculoskalatal:  Symmetrical without gross deformities. Neurologic:  Alert and oriented x4;  grossly normal neurologically. Psych:  Alert and cooperative. Normal mood and affect. Heme/Lymph/Immune: No excessive bruising noted.    05/23/2019 8:36 AM   Disclaimer: This note was dictated with voice recognition software. Similar sounding words can inadvertently be transcribed and may not be corrected upon review.

## 2019-05-23 NOTE — Progress Notes (Signed)
cc'ed to pcp °

## 2019-05-23 NOTE — Assessment & Plan Note (Signed)
Doing well on Protonix.  Continue current medications and follow-up in 6 months.

## 2019-05-23 NOTE — Assessment & Plan Note (Signed)
Mild to moderate generalized abdominal pain associated with nausea, diarrhea that started yesterday.  She thinks she either ate something bad or got a stomach virus.  She had multiple large stools yesterday which were loose.  No more stools today.  Recommended start probiotics and follow-up as needed.

## 2019-05-23 NOTE — Patient Instructions (Signed)
Your health issues we discussed today were:   GERD (reflux/heartburn): 1. Continue your current medications, specifically Protonix 2. Call us if you have any worsening or severe symptoms.  Constipation: 1. Continue your bowel regimen with Linzess as you have been 2. Call us if any worsening or severe symptoms  Abdominal cramping, diarrhea, nausea: 1. I feel you have either had a stomach virus or "ate something bad" 2. Start probiotics for about the next month.  He continues over-the-counter.  Unfortunately, we do not have any samples or coupons at this time 3. We often will use [employer] or [employer].  You can speak with the pharmacist if you need further recommendations. 4. Let us know if your symptoms do not improve  Overall I recommend:  1. Continue your current medications 2. Follow-up in 6 months 3. Call us if you have any questions or concerns.   Because of recent events of COVID-19 ("Coronavirus"), follow CDC recommendations:  Wash your hand frequently Avoid touching your face Stay away from people who are sick If you have symptoms such as fever, cough, shortness of breath then call your healthcare provider for further guidance If you are sick, STAY AT HOME unless otherwise directed by your healthcare provider. Follow directions from state and national officials regarding staying safe   At [facility] we value your feedback. You may receive a survey about your visit today. Please share your experience as we strive to create trusting relationships with our patients to provide genuine, compassionate, quality care.  We appreciate your understanding and patience as we review any laboratory studies, imaging, and other diagnostic tests that are ordered as we care for you. Our office policy is 5 business days for review of these results, and any emergent or urgent results are addressed in a timely manner for your best interest. If you do not hear from our  office in 1 week, please contact us.   We also encourage the use of MyChart, which contains your medical information for your review as well. If you are not enrolled in this feature, an access code is on this after visit summary for your convenience. Thank you for allowing [name] to be involved in your care.  It was great to see you today!  I hope you have a great summer!!

## 2019-06-04 ENCOUNTER — Telehealth: Payer: Self-pay

## 2019-06-04 NOTE — Telephone Encounter (Signed)
Soreness and aggravating pain in her left breast. Advised to schedule appt

## 2019-06-05 ENCOUNTER — Encounter: Payer: Self-pay | Admitting: Family Medicine

## 2019-06-05 ENCOUNTER — Ambulatory Visit (INDEPENDENT_AMBULATORY_CARE_PROVIDER_SITE_OTHER): Payer: Medicare Other | Admitting: Family Medicine

## 2019-06-05 ENCOUNTER — Other Ambulatory Visit: Payer: Self-pay

## 2019-06-05 VITALS — BP 130/70 | HR 72 | Temp 98.5°F | Resp 15 | Ht 64.0 in | Wt 175.0 lb

## 2019-06-05 DIAGNOSIS — E669 Obesity, unspecified: Secondary | ICD-10-CM

## 2019-06-05 DIAGNOSIS — N644 Mastodynia: Secondary | ICD-10-CM

## 2019-06-05 DIAGNOSIS — K219 Gastro-esophageal reflux disease without esophagitis: Secondary | ICD-10-CM

## 2019-06-05 DIAGNOSIS — F411 Generalized anxiety disorder: Secondary | ICD-10-CM

## 2019-06-05 DIAGNOSIS — I10 Essential (primary) hypertension: Secondary | ICD-10-CM | POA: Diagnosis not present

## 2019-06-05 NOTE — Assessment & Plan Note (Addendum)
2 day h/o left breast pain and tender mass palpabEl in left axilla at 7 o clock position, needs imaging

## 2019-06-05 NOTE — Patient Instructions (Signed)
F/U as before, call if you need me sooner  You are being referred for imaging of left breast, due  To pain and nodule/mass  Thanks for choosing [facility], we consider it a privelige to serve you.

## 2019-06-17 ENCOUNTER — Encounter: Payer: Self-pay | Admitting: Family Medicine

## 2019-06-17 DIAGNOSIS — E669 Obesity, unspecified: Secondary | ICD-10-CM | POA: Insufficient documentation

## 2019-06-17 DIAGNOSIS — E663 Overweight: Secondary | ICD-10-CM | POA: Insufficient documentation

## 2019-06-17 NOTE — Assessment & Plan Note (Signed)
Increased in past several months, no med change at this time

## 2019-06-17 NOTE — Assessment & Plan Note (Signed)
Controlled, no change in medication DASH diet and commitment to daily physical activity for a minimum of 30 minutes discussed and encouraged, as a part of hypertension management. The importance of attaining a healthy weight is also discussed.  BP/Weight 06/05/2019 05/23/2019 01/09/2019 01/08/2019 12/24/2018 3/81/7711 04/04/7902  Systolic BP - 833 383 291 916 606 004  Diastolic BP - 77 65 82 82 81 66  Wt. (Lbs) 175 176.2 168 170 169 170.4 164.12  BMI 30.04 30.24 28.84 29.18 29.01 29.25 28.17

## 2019-06-17 NOTE — Assessment & Plan Note (Signed)
  Patient re-educated about  the importance of commitment to a  minimum of 150 minutes of exercise per week as able.  The importance of healthy food choices with portion control discussed, as well as eating regularly and within a 12 hour window most days. The need to choose "clean , green" food 50 to 75% of the time is discussed, as well as to make water the primary drink and set a goal of 64 ounces water daily.    Weight /BMI 06/05/2019 05/23/2019 01/09/2019  WEIGHT 175 lb 176 lb 3.2 oz 168 lb  HEIGHT 5\' 4"  5\' 4"  5\' 4"   BMI 30.04 kg/m2 30.24 kg/m2 28.84 kg/m2

## 2019-06-17 NOTE — Progress Notes (Signed)
   Laura [name]     MRN: [id number]      DOB: [birth year]   HPI Laura [name] is here  With an approximate 2 DAY h/o left breast pain and nodule in area of surgical scar  Otherwise increased stress and depression with current living situation, not suicidal or homicidal, but unable to watch tT without crying ROS Denies recent fever or chills. Denies sinus pressure, nasal congestion, ear pain or sore throat. Denies chest congestion, productive cough or wheezing. Denies chest pains, palpitations and leg swelling Denies abdominal pain, nausea, vomiting,diarrhea or constipation.   Denies dysuria, frequency, hesitancy or incontinence. Denies joint pain, swelling and limitation in mobility. Denies headaches, seizures, numbness, or tingling. Denies depression, anxiety or insomnia. Denies skin break down or rash.   PE  Pulse 72   Temp 98.5 F (36.9 C) (Temporal)   Resp 15   Ht 5\' 4"  (1.626 m)   Wt 175 lb (79.4 kg)   SpO2 97%   BMI 30.04 kg/m   Patient alert and oriented and in no cardiopulmonary distress.  HEENT: No facial asymmetry, EOMI,   oropharynx pink and moist.  Neck supple no JVD, no mass.  Chest: Clear to auscultation bilaterally. Breast: no mass or tenderness in right breast, no axillary or supraclavicular lymph nodes palpable in both left and right LEFT BREAST:TENDER NODULE AT 7 O CLOCK POSITION, NO NIPPLE DISCHARGE  CVS: S1, S2 no murmurs, no S3.Regular rate.  ABD: Soft non tender.   Ext: No edema  MS: Adequate ROM spine, shoulders, hips and knees.  Skin: Intact, no ulcerations or rash noted.  Psych: Good eye contact, normal affect. Memory intact anxious AND MILDLY  depressed appearing.  CNS: CN 2-12 intact, power,  normal throughout.no focal deficits noted.   Assessment & Plan  Breast pain, left 2 day h/o left breast pain and tender mass palpabEl in left axilla at 7 o clock position, needs imaging  GAD (generalized anxiety disorder) Increased in past  several months, no med change at this time  Essential hypertension Controlled, no change in medication DASH diet and commitment to daily physical activity for a minimum of 30 minutes discussed and encouraged, as a part of hypertension management. The importance of attaining a healthy weight is also discussed.  BP/Weight 06/05/2019 05/23/2019 01/09/2019 01/08/2019 12/24/2018 0/35/0093 05/31/8298  Systolic BP - 371 696 789 381 017 510  Diastolic BP - 77 65 82 82 81 66  Wt. (Lbs) 175 176.2 168 170 169 170.4 164.12  BMI 30.04 30.24 28.84 29.18 29.01 29.25 28.17       GERD Controlled, no change in medication   Obesity (BMI 30.0-34.9)  Patient re-educated about  the importance of commitment to a  minimum of 150 minutes of exercise per week as able.  The importance of healthy food choices with portion control discussed, as well as eating regularly and within a 12 hour window most days. The need to choose "clean , green" food 50 to 75% of the time is discussed, as well as to make water the primary drink and set a goal of 64 ounces water daily.    Weight /BMI 06/05/2019 05/23/2019 01/09/2019  WEIGHT 175 lb 176 lb 3.2 oz 168 lb  HEIGHT 5\' 4"  5\' 4"  5\' 4"   BMI 30.04 kg/m2 30.24 kg/m2 28.84 kg/m2

## 2019-06-17 NOTE — Assessment & Plan Note (Signed)
Controlled, no change in medication  

## 2019-06-18 ENCOUNTER — Ambulatory Visit (HOSPITAL_COMMUNITY)
Admission: RE | Admit: 2019-06-18 | Discharge: 2019-06-18 | Disposition: A | Discharge status: Home / Self Care | Payer: Medicare Other | Source: Ambulatory Visit | Attending: Family Medicine | Admitting: Family Medicine

## 2019-06-18 ENCOUNTER — Other Ambulatory Visit: Payer: Self-pay

## 2019-06-18 DIAGNOSIS — N644 Mastodynia: Secondary | ICD-10-CM

## 2019-06-18 DIAGNOSIS — N6489 Other specified disorders of breast: Secondary | ICD-10-CM | POA: Diagnosis not present

## 2019-06-18 DIAGNOSIS — R928 Other abnormal and inconclusive findings on diagnostic imaging of breast: Secondary | ICD-10-CM | POA: Diagnosis not present

## 2019-06-18 IMAGING — MG DIGITAL DIAGNOSTIC UNILATERAL LEFT MAMMOGRAM WITH TOMO AND CAD
6 series · 6 of 18 positions shown · non-contrast
Comparison: Previous exam(s).

CLINICAL DATA: 67-year-old female presenting for evaluation of a
palpable lump in the left breast.

EXAM:
DIGITAL DIAGNOSTIC LEFT MAMMOGRAM WITH CAD AND TOMO
ULTRASOUND LEFT BREAST

[L CC synth-2D (1 of 2)]
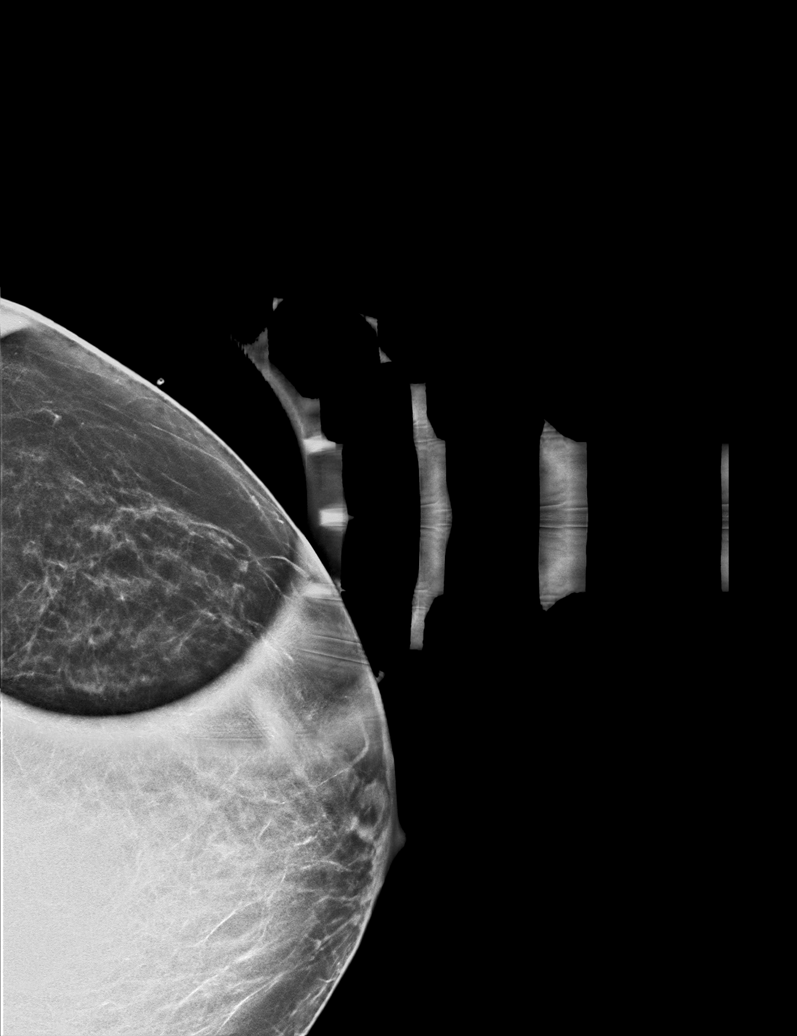

[L CC synth-2D (2 of 2)]
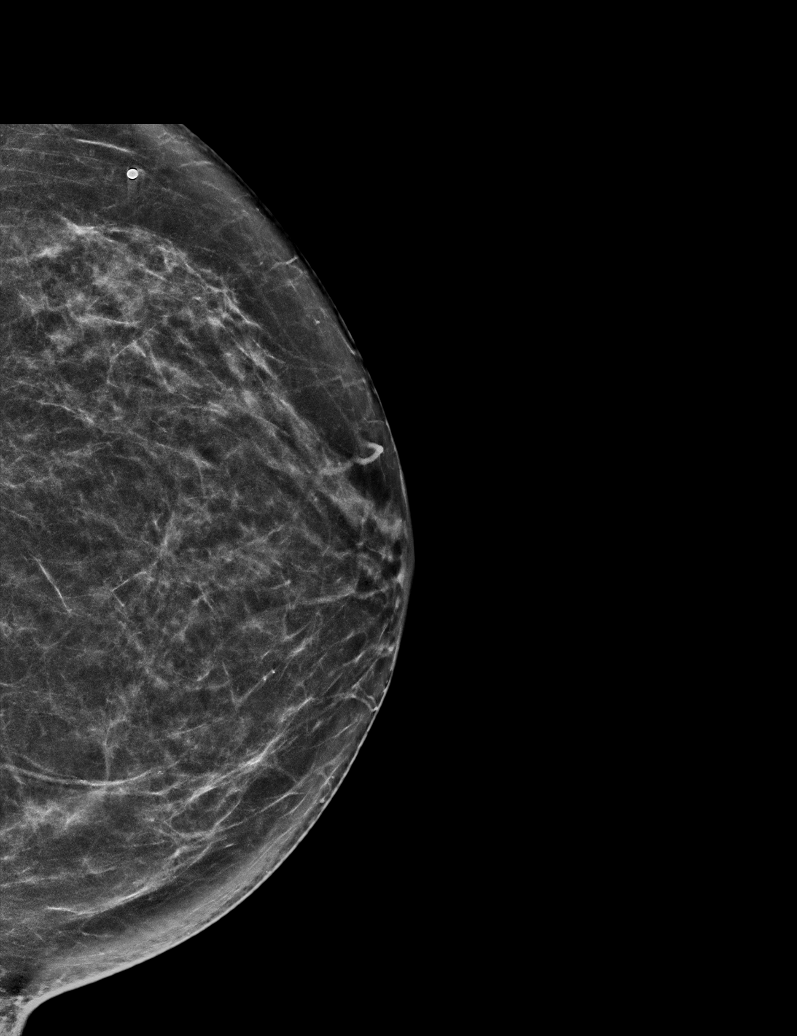

[L MLO synth-2D]
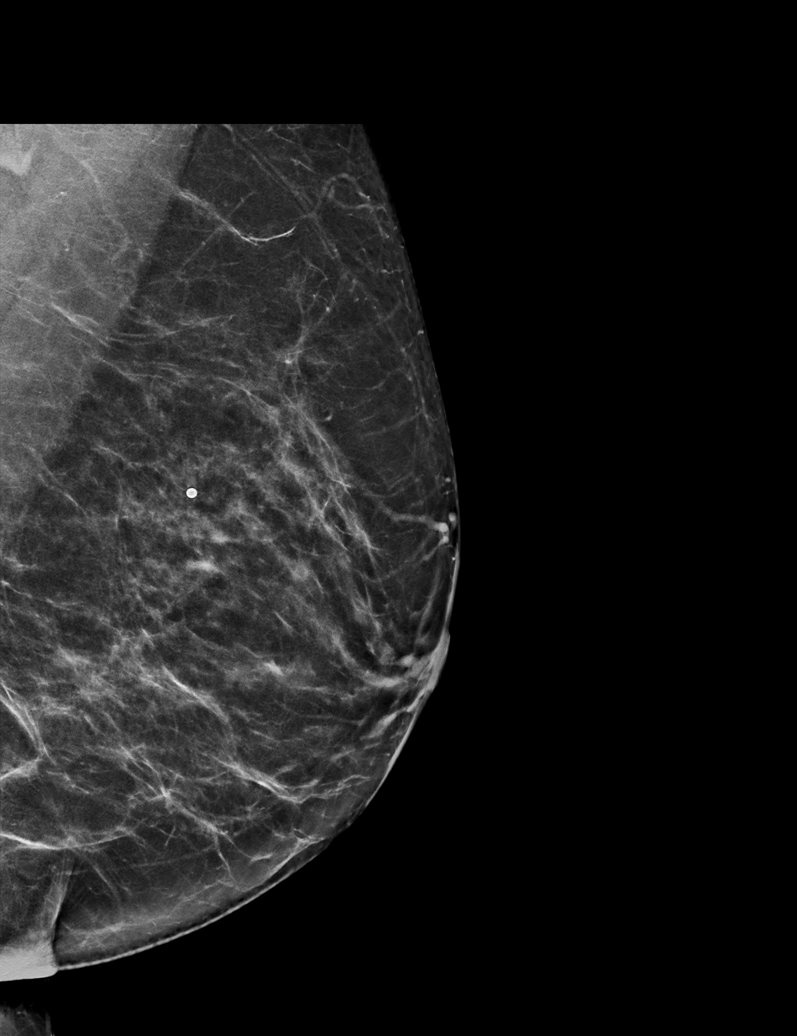

[L MLO tomo · tomo slice 36/71.0]
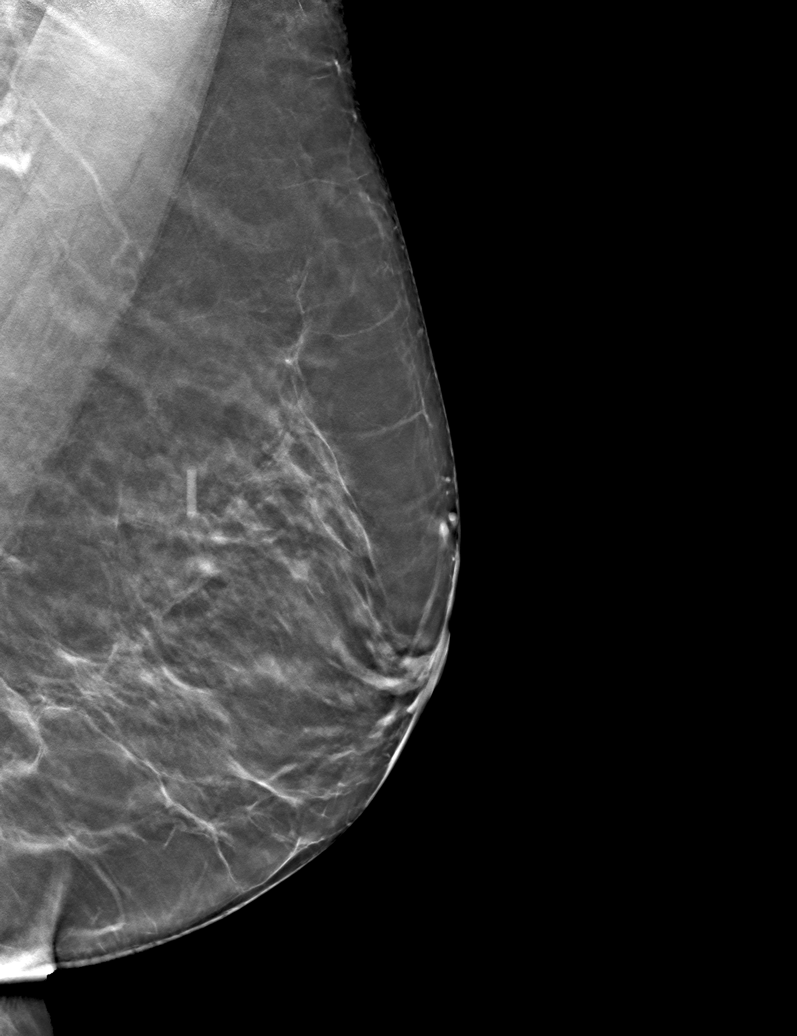

[L CC tomo (1 of 2) · tomo slice 33/66.0]
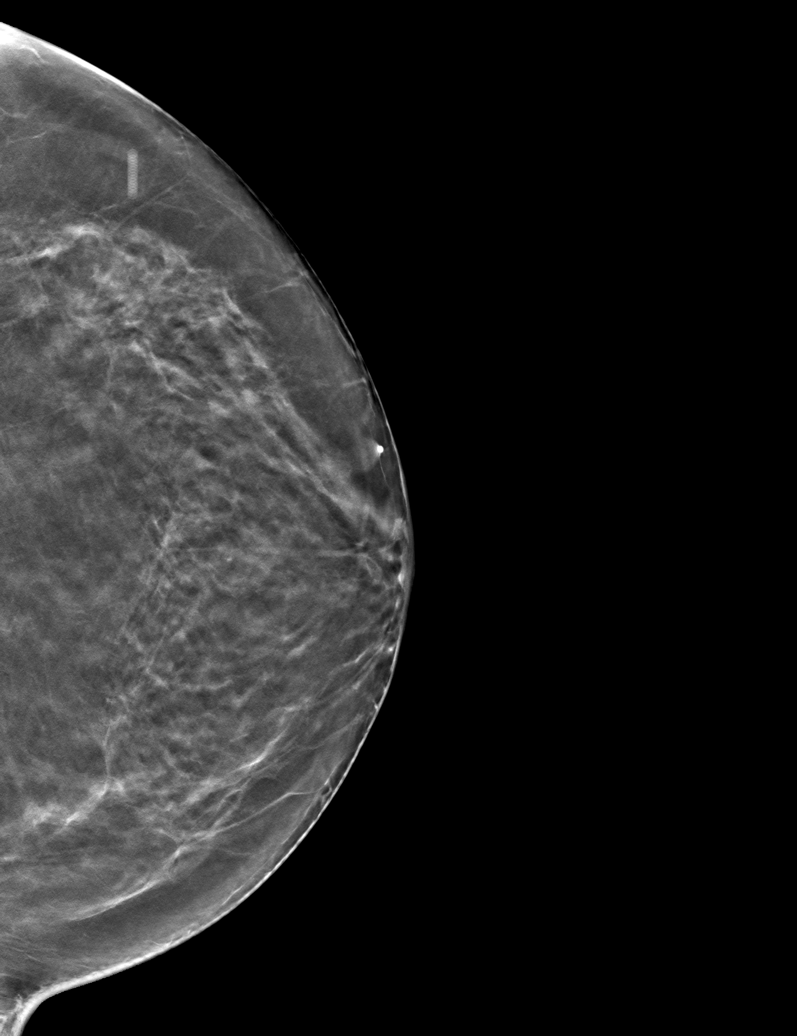

[L CC tomo (2 of 2) · tomo slice 27/52.0]
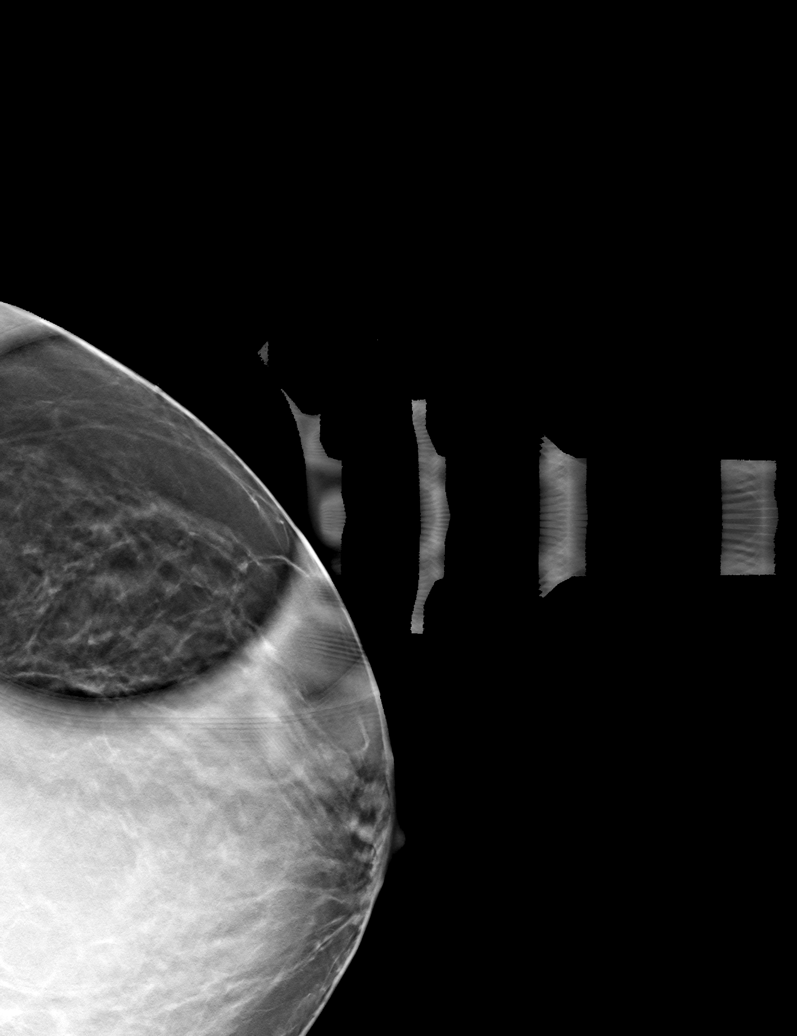

[6 of 18 positions shown; findings below may reference images not displayed]

ACR Breast Density Category b: There are scattered areas of
fibroglandular density.
FINDINGS: A BB has been placed along the lateral aspect of the left breast
indicating the palpable site of concern. There are no suspicious
mammographic findings deep to the marker. No suspicious
calcifications, masses or areas of distortion are seen in the left
breast.

Mammographic images were processed with CAD.

Physical exam of the palpable site in the lateral left breast
demonstrates no discrete palpable masses.

Ultrasound targeted to the left breast at 3 o'clock, 8 cm from the
nipple, demonstrates normal fibroglandular tissue. No masses or
suspicious areas of shadowing are identified.
IMPRESSION: 1. There are no suspicious mammographic or targeted sonographic
abnormalities at the palpable site of concern in the lateral left
breast.

RECOMMENDATION:
1. Clinical follow-up recommended for the palpable area of concern
in the left breast. Any further workup should be based on clinical
grounds.

2. Return to routine screening mammography is recommended. The
patient will be due for screening in [DATE].

I have discussed the findings and recommendations with the patient.
Results were also provided in writing at the conclusion of the
visit. If applicable, a reminder letter will be sent to the patient
regarding the next appointment.

BI-RADS CATEGORY  1: Negative.

## 2019-06-18 IMAGING — US ULTRASOUND LEFT BREAST LIMITED
1 series · 2 of 2 positions shown · non-contrast
Comparison: Previous exam(s).

CLINICAL DATA: 67-year-old female presenting for evaluation of a
palpable lump in the left breast.

EXAM:
DIGITAL DIAGNOSTIC LEFT MAMMOGRAM WITH CAD AND TOMO
ULTRASOUND LEFT BREAST

[Series 1: ultrasound left breast limited · 0.07mm/px · 2 of 2 slices shown]
[im 1/2]
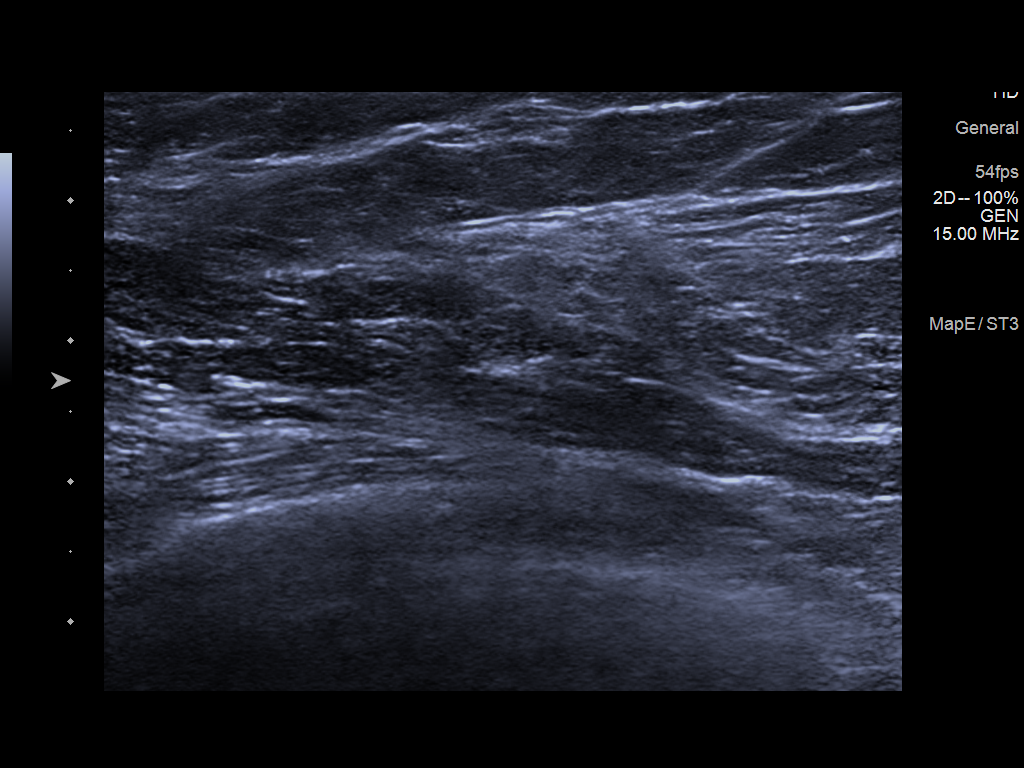
[im 2/2]
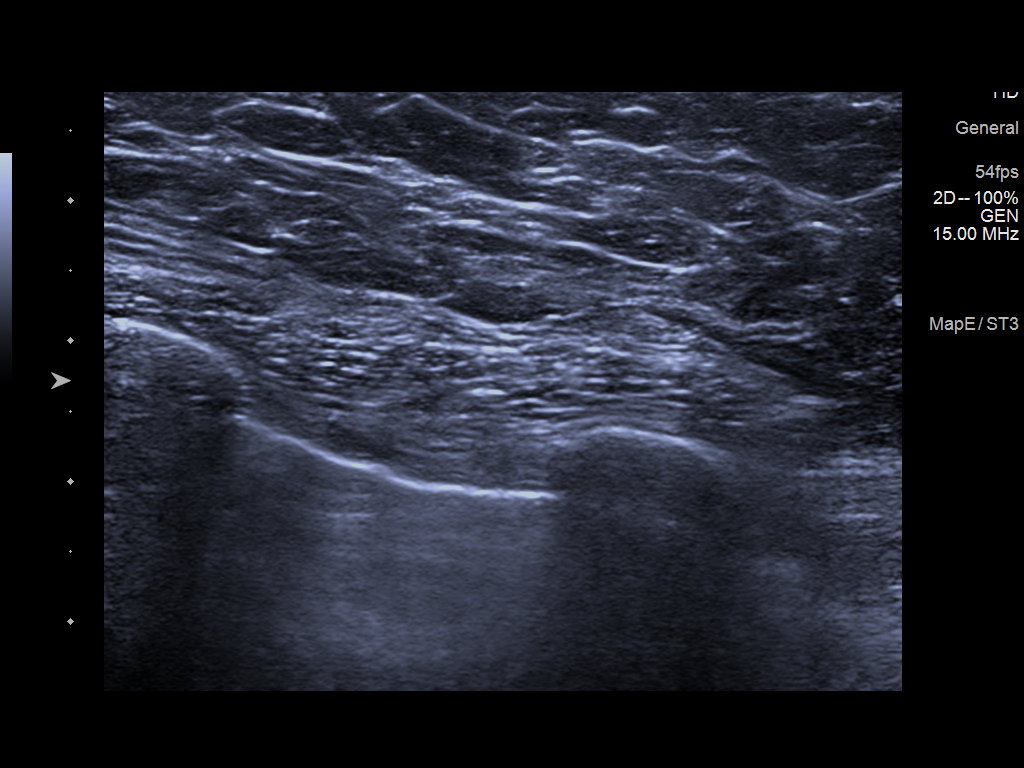

[2 of 2 positions shown; findings below may reference images not displayed]

ACR Breast Density Category b: There are scattered areas of
fibroglandular density.
FINDINGS: A BB has been placed along the lateral aspect of the left breast
indicating the palpable site of concern. There are no suspicious
mammographic findings deep to the marker. No suspicious
calcifications, masses or areas of distortion are seen in the left
breast.

Mammographic images were processed with CAD.

Physical exam of the palpable site in the lateral left breast
demonstrates no discrete palpable masses.

Ultrasound targeted to the left breast at 3 o'clock, 8 cm from the
nipple, demonstrates normal fibroglandular tissue. No masses or
suspicious areas of shadowing are identified.
IMPRESSION: 1. There are no suspicious mammographic or targeted sonographic
abnormalities at the palpable site of concern in the lateral left
breast.

RECOMMENDATION:
1. Clinical follow-up recommended for the palpable area of concern
in the left breast. Any further workup should be based on clinical
grounds.

2. Return to routine screening mammography is recommended. The
patient will be due for screening in [DATE].

I have discussed the findings and recommendations with the patient.
Results were also provided in writing at the conclusion of the
visit. If applicable, a reminder letter will be sent to the patient
regarding the next appointment.

BI-RADS CATEGORY  1: Negative.

## 2019-06-19 ENCOUNTER — Telehealth: Payer: Self-pay | Admitting: Family Medicine

## 2019-06-19 ENCOUNTER — Other Ambulatory Visit: Payer: Self-pay | Admitting: Family Medicine

## 2019-06-19 MED ORDER — TRAMADOL HCL 50 MG PO TABS: ORAL_TABLET | ORAL | 0 refills | Status: DC

## 2019-06-19 NOTE — Telephone Encounter (Signed)
C/o left breast pain despite normal mammogram. . I advised likely scar tissue Tramadol in limited suply prescribed # 10 tabs 1 at night as needed. Left hand written note in office describing ongoig severe breast pain esp at night

## 2019-06-19 NOTE — Progress Notes (Signed)
Tramadol 50  

## 2019-06-20 ENCOUNTER — Other Ambulatory Visit: Payer: Self-pay

## 2019-06-20 ENCOUNTER — Ambulatory Visit (INDEPENDENT_AMBULATORY_CARE_PROVIDER_SITE_OTHER): Payer: Medicare Other

## 2019-06-20 DIAGNOSIS — Z23 Encounter for immunization: Secondary | ICD-10-CM | POA: Diagnosis not present

## 2019-06-20 NOTE — Progress Notes (Signed)
Patient came in for flu vaccine. Given in RIGHT deltoid. Tolerated well

## 2019-06-26 ENCOUNTER — Other Ambulatory Visit: Payer: Self-pay | Admitting: Family Medicine

## 2019-07-01 ENCOUNTER — Telehealth: Payer: Self-pay | Admitting: Family Medicine

## 2019-07-01 NOTE — Telephone Encounter (Signed)
Please advise 

## 2019-07-01 NOTE — Telephone Encounter (Signed)
Pt is calling in, still with pain in her breast. She advised that Dr Moshe Cipro prescribed Tramdol 7 pills. And she is out, wanted to know if she could have 7 more.  Also is her Shingle shot Due?   Please call the pt

## 2019-07-02 NOTE — Telephone Encounter (Signed)
Called patient. No answer. Left generic message requesting call back.  

## 2019-07-03 NOTE — Telephone Encounter (Signed)
Spoke with the patient and the office. Told her the treatment plan with verbal understanding.

## 2019-07-03 NOTE — Telephone Encounter (Signed)
Left generic message requesting a call back.  

## 2019-07-03 NOTE — Telephone Encounter (Signed)
Called cell phone and no answer. vm has not been set up. Will try back later.

## 2019-07-10 ENCOUNTER — Other Ambulatory Visit: Payer: Self-pay | Admitting: Family Medicine

## 2019-07-10 ENCOUNTER — Telehealth: Payer: Self-pay | Admitting: Family Medicine

## 2019-07-10 MED ORDER — TRAMADOL HCL 50 MG PO TABS: ORAL_TABLET | ORAL | 0 refills | Status: DC

## 2019-07-10 NOTE — Telephone Encounter (Signed)
Requesting more for the breast pain to use only when needed

## 2019-07-10 NOTE — Telephone Encounter (Signed)
Pt is calling she needs to have some more Tramadol, she states she will only take it as needed.

## 2019-07-10 NOTE — Telephone Encounter (Signed)
Please let her know that I have sent in a total of 7 tablets, if she continues to need additional tablets will need an office evaluation. I recommend using cool compresses to breast to help with pain, should not be having ongoing left breast pain like this

## 2019-07-11 NOTE — Telephone Encounter (Signed)
Message left for patient

## 2019-07-23 ENCOUNTER — Encounter: Payer: Self-pay | Admitting: Family Medicine

## 2019-07-23 ENCOUNTER — Ambulatory Visit (INDEPENDENT_AMBULATORY_CARE_PROVIDER_SITE_OTHER): Payer: Medicare Other | Admitting: Family Medicine

## 2019-07-23 ENCOUNTER — Other Ambulatory Visit: Payer: Self-pay

## 2019-07-23 VITALS — BP 110/70 | HR 82 | Temp 97.8°F | Ht 64.0 in | Wt 176.0 lb

## 2019-07-23 DIAGNOSIS — I10 Essential (primary) hypertension: Secondary | ICD-10-CM

## 2019-07-23 DIAGNOSIS — D649 Anemia, unspecified: Secondary | ICD-10-CM | POA: Diagnosis not present

## 2019-07-23 DIAGNOSIS — E785 Hyperlipidemia, unspecified: Secondary | ICD-10-CM | POA: Diagnosis not present

## 2019-07-23 DIAGNOSIS — L309 Dermatitis, unspecified: Secondary | ICD-10-CM

## 2019-07-23 DIAGNOSIS — E669 Obesity, unspecified: Secondary | ICD-10-CM

## 2019-07-23 DIAGNOSIS — E890 Postprocedural hypothyroidism: Secondary | ICD-10-CM

## 2019-07-23 DIAGNOSIS — Z1231 Encounter for screening mammogram for malignant neoplasm of breast: Secondary | ICD-10-CM | POA: Diagnosis not present

## 2019-07-23 DIAGNOSIS — K219 Gastro-esophageal reflux disease without esophagitis: Secondary | ICD-10-CM

## 2019-07-23 DIAGNOSIS — E559 Vitamin D deficiency, unspecified: Secondary | ICD-10-CM

## 2019-07-23 DIAGNOSIS — F411 Generalized anxiety disorder: Secondary | ICD-10-CM

## 2019-07-23 MED ORDER — HYDROXYZINE HCL 25 MG PO TABS: 25.0000 mg | ORAL_TABLET | Freq: Three times a day (TID) | ORAL | 0 refills | Status: DC | PRN

## 2019-07-23 MED ORDER — TRIAMCINOLONE ACETONIDE 0.1 % EX CREA: TOPICAL_CREAM | CUTANEOUS | 0 refills | Status: DC

## 2019-07-23 NOTE — Assessment & Plan Note (Addendum)
6 day  History, unknown trigger, topical high intensity steroid, and hydroxyzine, dermatology eval if persists

## 2019-07-23 NOTE — Progress Notes (Signed)
   Laura [name]     MRN: [id number]      DOB: 1950-12-21   HPI [name] is here with a 6 day h/o bright red splotches on right leg they are very pruritic, the smallest most inferior area has started to resolve, however the the other 2 are same size and itching. First episode, no known trigger. No drainage from rash ROS Denies recent fever or chills. Denies sinus pressure, nasal congestion, ear pain or sore throat. Denies chest congestion, productive cough or wheezing. Denies chest pains, palpitations and leg swelling Denies abdominal pain, nausea, vomiting,diarrhea or constipation.   Denies dysuria, frequency, hesitancy or incontinence. Denies joint pain, swelling and limitation in mobility. Denies headaches, seizures, numbness, or tingling. Denies depression, anxiety or insomnia.   PE  Pulse 82   Temp 97.8 F (36.6 C) (Temporal)   Ht 5\' 4"  (1.626 m)   Wt 176 lb (79.8 kg)   BMI 30.21 kg/m   Patient alert and oriented and in no cardiopulmonary distress.  HEENT: No facial asymmetry, EOMI,   oropharynx pink and moist.  Neck supple no JVD, no mass.  Chest: Clear to auscultation bilaterally.  CVS: S1, S2 no murmurs, no S3.Regular rate.  ABD: Soft non tender.   Ext: No edema  MS: Adequate ROM spine, shoulders, hips and knees.  Skin: Intact, erythematous macular , annular rashes x 2 on right leg, no warmth or tenderness, no drainage, no visible puncture sights  Psych: Good eye contact, normal affect. Memory intact not anxious or depressed appearing.  CNS: CN 2-12 intact, power,  normal throughout.no focal deficits noted.   Assessment & Plan  Dermatitis 6 day  History, unknown trigger, topical high intensity steroid, and hydroxyzine, dermatology eval if persists  Essential hypertension Controlled, no change in medication DASH diet and commitment to daily physical activity for a minimum of 30 minutes discussed and encouraged, as a part of hypertension management. The  importance of attaining a healthy weight is also discussed.  BP/Weight 07/23/2019 06/05/2019 05/23/2019 01/09/2019 01/08/2019 12/24/2018 6/44/0347  Systolic BP 425 956 387 564 [phone]  Diastolic BP 70 70 77 65 82 82 81  Wt. (Lbs) 176 175 176.2 168 170 169 170.4  BMI 30.21 30.04 30.24 28.84 29.18 29.01 29.25       GAD (generalized anxiety disorder) Controlled, no change in medication   POSTSURGICAL HYPOTHYROIDISM Controlled and followed by Endo  GERD Controlled, no change in medication   Obesity (BMI 30.0-34.9)  Patient re-educated about  the importance of commitment to a  minimum of 150 minutes of exercise per week as able.  The importance of healthy food choices with portion control discussed, as well as eating regularly and within a 12 hour window most days. The need to choose "clean , green" food 50 to 75% of the time is discussed, as well as to make water the primary drink and set a goal of 64 ounces water daily.    Weight /BMI 07/23/2019 06/05/2019 05/23/2019  WEIGHT 176 lb 175 lb 176 lb 3.2 oz  HEIGHT 5\' 4"  5\' 4"  5\' 4"   BMI 30.21 kg/m2 30.04 kg/m2 30.24 kg/m2

## 2019-07-23 NOTE — Patient Instructions (Addendum)
F/U as before, call if you need   Me sooner  Please get fasting lipid, cmp and EGFr, and Vit D, CBC 1 week before December visit  Pls schedule December mammogram  Cream and tablets are prescribed for your rash  Thanks for choosing [facility], we consider it a privelige to serve you.

## 2019-07-28 ENCOUNTER — Encounter: Payer: Self-pay | Admitting: Family Medicine

## 2019-07-28 NOTE — Assessment & Plan Note (Signed)
Controlled, no change in medication  

## 2019-07-28 NOTE — Assessment & Plan Note (Signed)
  Patient re-educated about  the importance of commitment to a  minimum of 150 minutes of exercise per week as able.  The importance of healthy food choices with portion control discussed, as well as eating regularly and within a 12 hour window most days. The need to choose "clean , green" food 50 to 75% of the time is discussed, as well as to make water the primary drink and set a goal of 64 ounces water daily.    Weight /BMI 07/23/2019 06/05/2019 05/23/2019  WEIGHT 176 lb 175 lb 176 lb 3.2 oz  HEIGHT 5\' 4"  5\' 4"  5\' 4"   BMI 30.21 kg/m2 30.04 kg/m2 30.24 kg/m2

## 2019-07-28 NOTE — Assessment & Plan Note (Signed)
Controlled, no change in medication DASH diet and commitment to daily physical activity for a minimum of 30 minutes discussed and encouraged, as a part of hypertension management. The importance of attaining a healthy weight is also discussed.  BP/Weight 07/23/2019 06/05/2019 05/23/2019 01/09/2019 01/08/2019 12/24/2018 8/52/7782  Systolic BP 423 536 144 315 [phone]  Diastolic BP 70 70 77 65 82 82 81  Wt. (Lbs) 176 175 176.2 168 170 169 170.4  BMI 30.21 30.04 30.24 28.84 29.18 29.01 29.25

## 2019-07-28 NOTE — Assessment & Plan Note (Signed)
Controlled and followed by Endo 

## 2019-07-30 ENCOUNTER — Ambulatory Visit: Payer: Medicare Other | Admitting: Family Medicine

## 2019-08-06 ENCOUNTER — Ambulatory Visit: Payer: Medicare Other

## 2019-08-07 ENCOUNTER — Encounter: Payer: Self-pay | Admitting: Family Medicine

## 2019-08-07 ENCOUNTER — Other Ambulatory Visit: Payer: Self-pay

## 2019-08-07 ENCOUNTER — Ambulatory Visit (INDEPENDENT_AMBULATORY_CARE_PROVIDER_SITE_OTHER): Payer: Medicare Other | Admitting: Family Medicine

## 2019-08-07 VITALS — BP 120/72 | HR 75 | Temp 98.3°F | Resp 15 | Ht 64.0 in | Wt 178.0 lb

## 2019-08-07 DIAGNOSIS — Z0000 Encounter for general adult medical examination without abnormal findings: Secondary | ICD-10-CM

## 2019-08-07 NOTE — Progress Notes (Signed)
Subjective:   Laura [name] is a 68 y.o. female who presents for Medicare Annual (Subsequent) preventive examination.  Review of Systems:     Cardiac Risk Factors include: advanced age (>35men, >84 women);hypertension;dyslipidemia;obesity (BMI >30kg/m2)     Objective:     Vitals: BP 110/70   Pulse 82   Resp 15   Ht  (1.626 m)   Wt 176 lb (79.8 kg)   BMI 30.21 kg/m   Body mass index is 30.21 kg/m.  Advanced Directives 07/30/2018 01/16/2018 08/18/2017 08/10/2017 08/03/2017 07/25/2017 07/19/2017  Does Patient Have a Medical Advance Directive? No No No No No No No  Would patient like information on creating a medical advance directive? Yes (ED - Information included in AVS) No - Patient declined No - Patient declined No - Patient declined No - Patient declined Yes (MAU/Ambulatory/Procedural Areas - Information given) No - Patient declined  Pre-existing out of facility DNR order (yellow form or pink MOST form) - - - - - - -    Tobacco Social History   Tobacco Use  Smoking Status Former Smoker  . Packs/day: 0.25  . Years: 1.00  . Pack years: 0.25  . Types: Cigarettes  . Quit date: 08/25/1972  . Years since quitting: 46.9  Smokeless Tobacco Never Used     Counseling given: Yes   Clinical Intake:  Pre-visit preparation completed: Yes  Pain : No/denies pain Pain Score: 0-No pain     BMI - recorded: 30.21 Nutritional Status: BMI > 30  Obese Nutritional Risks: None Diabetes: No  How often do you need to have someone help you when you read instructions, pamphlets, or other written materials from your doctor or pharmacy?: 1 - Never What is the last grade level you completed in school?: 12  Interpreter Needed?: No     Past Medical History:  Diagnosis Date  . Anxiety   . At low risk for fall 08/15/2018  . Cystitis   . Depression   . Gastritis   . GERD (gastroesophageal reflux disease)   . Hashimoto's thyroiditis    Hx   . Hyperlipidemia   . Hypertension    . Hypothyroidism   . Normocytic anemia 2009 Hb 9.9-11.1  . Osteopenia   . Shoulder pain   . Urinary incontinence    Past Surgical History:  Procedure Laterality Date  . Bilateral tubal ligation    . BREAST EXCISIONAL BIOPSY  2010   Excisional biopsy of benign left breast mass -lopoma   . BREAST SURGERY     left nreast biopsy  . COLONOSCOPY  08/26/2011   SLF: 1. Internal hemorrhoids  . COLONOSCOPY N/A 10/06/2015   ZOX:WRUE fissure or internal hemorrhoids/mild sigmoid colitis. benign colonic path   . colonscopy  2005   Dr. Katrinka Blazing  . ESOPHAGOGASTRODUODENOSCOPY  12/2009   chronic gastritis  . ESOPHAGOGASTRODUODENOSCOPY N/A 05/17/2017   Dr. Darrick Penna; gastritis, normal small bowel biopsies, multiple gastric polyps. fundic gland polyps  . GIVENS CAPSULE STUDY N/A 05/30/2017   gastritis, no source for anemia identified  . left knee surgery Left March 1,2017  . PARTIAL KNEE ARTHROPLASTY Left 08/09/2017   Procedure: LEFT UNICOMPARTMENTAL KNEE;  Surgeon: Ollen Gross, MD;  Location: [facility];  Service: Orthopedics;  Laterality: Left;  . Resection of left lobe of thyroid    . right carpal tunnel release    . rt. knee athroscopy  2004  . SAVORY DILATION N/A 05/17/2017   Procedure: SAVORY DILATION;  Surgeon: West Bali, MD;  Location: [facility];  Service: Endoscopy;  Laterality: N/A;  . TOTAL ABDOMINAL HYSTERECTOMY  1994  . UMBILICAL HERNIA REPAIR    . Urethral dilation for stenosis  2009   Family History  Problem Relation Age of Onset  . Pneumonia Mother   . Colon cancer Neg Hx   . Anesthesia problems Neg Hx   . Hypotension Neg Hx   . Malignant hyperthermia Neg Hx   . Pseudochol deficiency Neg Hx    Social History   Socioeconomic History  . Marital status: Married    Spouse name: Not on file  . Number of children: 3  . Years of education: Not on file  . Highest education level: Not on file  Occupational History  . 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 Financial resource strain: Not on file  . Food insecurity    Worry: Not on file    Inability: Not on file  . Transportation needs    Medical: Not on file    Non-medical: Not on file  Tobacco Use  . Smoking status: Former Smoker    Packs/day: 0.25    Years: 1.00    Pack years: 0.25    Types: Cigarettes    Quit date: 08/25/1972    Years since quitting: 46.9  . Smokeless tobacco: Never Used  Substance and Sexual Activity  . Alcohol use: No  . Drug use: No  . Sexual activity: Yes    Birth control/protection: Surgical    Comment: married  Lifestyle  . Physical activity    Days per week: Not on file    Minutes per session: Not on file  . Stress: Not on file  Relationships  . Social Musicianconnections    Talks on phone: Not on file    Gets together: Not on file    Attends religious service: Not on file    Active member of club or organization: Not on file    Attends meetings of clubs or organizations: Not on file    Relationship status: Not on file  Other Topics Concern  . Not on file  Social History Narrative  . Not on file    Outpatient Encounter Medications as of 08/07/2019  Medication Sig  . acetaminophen (TYLENOL) 500 MG tablet Take 1,000 mg by mouth every 8 (eight) hours as needed (for pain.).   Marland Kitchen. amLODipine (NORVASC) 2.5 MG tablet TAKE 1 TABLET(2.5 MG) BY MOUTH DAILY  . atorvastatin (LIPITOR) 20 MG tablet TAKE 1 TABLET BY MOUTH  DAILY  . citalopram (CELEXA) 20 MG tablet TAKE 1 TABLET BY MOUTH  DAILY  . gabapentin (NEURONTIN) 100 MG capsule TAKE 1 CAPSULE(100 MG) BY MOUTH AT BEDTIME  . hydrochlorothiazide (HYDRODIURIL) 25 MG tablet TAKE 1 TABLET BY MOUTH  DAILY  . hydrOXYzine (ATARAX/VISTARIL) 25 MG tablet Take 1 tablet (25 mg total) by mouth 3 (three) times daily as needed.  Marland Kitchen. levothyroxine (SYNTHROID, LEVOTHROID) 112 MCG tablet TAKE 1 TABLET(112 MCG) BY MOUTH DAILY BEFORE BREAKFAST  . linaclotide (LINZESS) 72 MCG capsule Take 1 capsule (72 mcg total) by  mouth daily before breakfast. (Patient taking differently: Take 72 mcg by mouth as needed. )  . loratadine (CLARITIN) 10 MG tablet Take 1 tablet (10 mg total) by mouth daily. (Patient taking differently: Take 10 mg by mouth daily as needed for allergies. )  . pantoprazole (PROTONIX) 40 MG tablet TAKE 1 TABLET BY MOUTH  TWICE DAILY  . Polyethyl Glycol-Propyl Glycol (SYSTANE) 0.4-0.3 %  SOLN Apply 1 drop to eye 3 (three) times daily as needed (for dry eyes.).  Marland Kitchen potassium chloride (K-DUR) 10 MEQ tablet Take 1 tablet (10 mEq total) by mouth 2 (two) times daily.  . Probiotic Product (PROBIOTIC ADVANCED PO) Take 1 capsule by mouth daily.  . traMADol (ULTRAM) 50 MG tablet Take one tablet by mouth once daily, as needed, for breast pain  . triamcinolone cream (KENALOG) 0.1 % Apply twice daily to rash  On right leg for 5 days , then as needed   No facility-administered encounter medications on file as of 08/07/2019.     Activities of Daily Living In your present state of health, do you have any difficulty performing the following activities: 08/07/2019  Hearing? N  Vision? N  Difficulty concentrating or making decisions? N  Walking or climbing stairs? N  Dressing or bathing? N  Doing errands, shopping? N  Preparing Food and eating ? N  Using the Toilet? N  In the past six months, have you accidently leaked urine? N  Do you have problems with loss of bowel control? N  Managing your Medications? N  Managing your Finances? N  Housekeeping or managing your Housekeeping? N  Some recent data might be hidden    Patient Care Team: Fayrene Helper, MD as PCP - [facility], Marga Melnick, MD (Gastroenterology) Herminio Commons, MD as Attending Physician (Cardiology) Netta Cedars, MD as Consulting Physician (Orthopedic Surgery) Cassandria Anger, MD as Consulting Physician (Endocrinology) Gaynelle Arabian, MD as Consulting Physician (Orthopedic Surgery)    Assessment:   This is a routine  wellness examination for [place].  Exercise Activities and Dietary recommendations Current Exercise Habits: The patient does not participate in regular exercise at present, Exercise limited by: None identified  Goals    . Exercise 3x per week (30 min per time)     Recommend starting a routine exercise program at least 3 days a week for 30-45 minutes at a time as tolerated.         Fall Risk Fall Risk  08/07/2019 06/05/2019 12/24/2018 11/06/2018 10/18/2018  Falls in the past year? 0 0 0 0 0  Number falls in past yr: 0 0 0 0 0  Injury with Fall? 0 0 0 0 0   Is the patient's home free of loose throw rugs in walkways, pet beds, electrical cords, etc?   yes      Grab bars in the bathroom? yes      Handrails on the stairs?   yes      Adequate lighting?   yes     Depression Screen PHQ 2/9 Scores 08/07/2019 06/05/2019 01/08/2019 12/24/2018  PHQ - 2 Score 1 0 0 0  PHQ- 9 Score - - - -     Cognitive Function     6CIT Screen 08/07/2019 07/30/2018 07/25/2017  What Year? 0 points 0 points 0 points  What month? 0 points 0 points 0 points  What time? 0 points 0 points 0 points  Count back from 20 0 points 0 points 0 points  Months in reverse 0 points 0 points 0 points  Repeat phrase 0 points 0 points 0 points  Total Score 0 0 0    Immunization History  Administered Date(s) Administered  . Fluad Quad(high Dose 65+) 06/20/2019  . Influenza Split 07/17/2012  . Influenza Whole 10/08/2003, 07/01/2010, 07/14/2011  . Influenza,inj,Quad PF,6+ Mos 08/14/2013, 08/28/2014, 09/03/2015, 08/10/2016, 06/21/2017, 06/26/2018  . Influenza-Unspecified 09/14/2017  . Pneumococcal Conjugate-13 06/05/2014  .  Pneumococcal Polysaccharide-23 11/20/2015  . Td 04/21/2004  . Tdap 06/05/2014  . Zoster 03/31/2011    Qualifies for Shingles Vaccine? completed  Screening Tests Health Maintenance  Topic Date Due  . URINE MICROALBUMIN  09/15/2021 (Originally 07/11/1961)  . MAMMOGRAM  10/10/2020  . TETANUS/TDAP   06/05/2024  . COLONOSCOPY  10/05/2025  . INFLUENZA VACCINE  Completed  . DEXA SCAN  Completed  . Hepatitis C Screening  Completed  . PNA vac Low Risk Adult  Completed    Cancer Screenings: Lung: Low Dose CT Chest recommended if Age 49-80 years, 30 pack-year currently smoking OR have quit w/in 15years. Patient does not qualify. Breast:  Up to date on Mammogram? Yes   Up to date of Bone Density/Dexa? Yes Colorectal:  Due 2026  Additional Screenings:   Hepatitis C Screening: completed     Plan:       1. Encounter for Medicare annual wellness exam  I have personally reviewed and noted the following in the patient's chart:   . Medical and social history . Use of alcohol, tobacco or illicit drugs  . Current medications and supplements . Functional ability and status . Nutritional status . Physical activity . Advanced directives . List of other physicians . Hospitalizations, surgeries, and ER visits in previous 12 months . Vitals . Screenings to include cognitive, depression, and falls . Referrals and appointments  In addition, I have reviewed and discussed with patient certain preventive protocols, quality metrics, and best practice recommendations. A written personalized care plan for preventive services as well as general preventive health recommendations were provided to patient.     Freddy Finner, NP  08/07/2019

## 2019-08-07 NOTE — Patient Instructions (Addendum)
Laura [name] , Thank you for taking time to come for your Medicare Wellness Visit. I appreciate your ongoing commitment to your health goals. Please review the following plan we discussed and let me know if I can assist you in the future.   Please continue to practice social distancing to keep you, your family, and our community safe.  If you must go out, please wear a Mask and practice good handwashing.  Screening recommendations/referrals: Colonoscopy: Due 2026 Mammogram: Completed Bone Density: Completed Recommended yearly ophthalmology/optometry visit for glaucoma screening and checkup Recommended yearly dental visit for hygiene and checkup  Vaccinations: Influenza vaccine: Completed Pneumococcal vaccine: Completed Tdap vaccine: Due 2025 Shingles vaccine: Completed  Advanced directives: paper provided today  Conditions/risks identified: Falls   Next appointment: 10/21/2019    Preventive Care 81 Years and Older, Female Preventive care refers to lifestyle choices and visits with your health care provider that can promote health and wellness. What does preventive care include?  A yearly physical exam. This is also called an annual well check.  Dental exams once or twice a year.  Routine eye exams. Ask your health care provider how often you should have your eyes checked.  Personal lifestyle choices, including:  Daily care of your teeth and gums.  Regular physical activity.  Eating a healthy diet.  Avoiding tobacco and drug use.  Limiting alcohol use.  Practicing safe sex.  Taking low-dose aspirin every day.  Taking vitamin and mineral supplements as recommended by your health care provider. What happens during an annual well check? The services and screenings done by your health care provider during your annual well check will depend on your age, overall health, lifestyle risk factors, and family history of disease. Counseling  Your health care provider may ask  you questions about your:  Alcohol use.  Tobacco use.  Drug use.  Emotional well-being.  Home and relationship well-being.  Sexual activity.  Eating habits.  History of falls.  Memory and ability to understand (cognition).  Work and work Statistician.  Reproductive health. Screening  You may have the following tests or measurements:  Height, weight, and BMI.  Blood pressure.  Lipid and cholesterol levels. These may be checked every 5 years, or more frequently if you are over 69 years old.  Skin check.  Lung cancer screening. You may have this screening every year starting at age 79 if you have a 30-pack-year history of smoking and currently smoke or have quit within the past 15 years.  Fecal occult blood test (FOBT) of the stool. You may have this test every year starting at age 48.  Flexible sigmoidoscopy or colonoscopy. You may have a sigmoidoscopy every 5 years or a colonoscopy every 10 years starting at age 92.  Hepatitis C blood test.  Hepatitis B blood test.  Sexually transmitted disease (STD) testing.  Diabetes screening. This is done by checking your blood sugar (glucose) after you have not eaten for a while (fasting). You may have this done every 1-3 years.  Bone density scan. This is done to screen for osteoporosis. You may have this done starting at age 71.  Mammogram. This may be done every 1-2 years. Talk to your health care provider about how often you should have regular mammograms. Talk with your health care provider about your test results, treatment options, and if necessary, the need for more tests. Vaccines  Your health care provider may recommend certain vaccines, such as:  Influenza vaccine. This is recommended every year.  Tetanus, diphtheria, and acellular pertussis (Tdap, Td) vaccine. You may need a Td booster every 10 years.  Zoster vaccine. You may need this after age 53.  Pneumococcal 13-valent conjugate (PCV13) vaccine. One dose  is recommended after age 28.  Pneumococcal polysaccharide (PPSV23) vaccine. One dose is recommended after age 72. Talk to your health care provider about which screenings and vaccines you need and how often you need them. This information is not intended to replace advice given to you by your health care provider. Make sure you discuss any questions you have with your health care provider. Document Released: 11/13/2015 Document Revised: 07/06/2016 Document Reviewed: 08/18/2015 Elsevier Interactive Patient Education  2017 [place] Prevention in the Home Falls can cause injuries. They can happen to people of all ages. There are many things you can do to make your home safe and to help prevent falls. What can I do on the outside of my home?  Regularly fix the edges of walkways and driveways and fix any cracks.  Remove anything that might make you trip as you walk through a door, such as a raised step or threshold.  Trim any bushes or trees on the path to your home.  Use bright outdoor lighting.  Clear any walking paths of anything that might make someone trip, such as rocks or tools.  Regularly check to see if handrails are loose or broken. Make sure that both sides of any steps have handrails.  Any raised decks and porches should have guardrails on the edges.  Have any leaves, snow, or ice cleared regularly.  Use sand or salt on walking paths during winter.  Clean up any spills in your garage right away. This includes oil or grease spills. What can I do in the bathroom?  Use night lights.  Install grab bars by the toilet and in the tub and shower. Do not use towel bars as grab bars.  Use non-skid mats or decals in the tub or shower.  If you need to sit down in the shower, use a plastic, non-slip stool.  Keep the floor dry. Clean up any water that spills on the floor as soon as it happens.  Remove soap buildup in the tub or shower regularly.  Attach bath mats  securely with double-sided non-slip rug tape.  Do not have throw rugs and other things on the floor that can make you trip. What can I do in the bedroom?  Use night lights.  Make sure that you have a light by your bed that is easy to reach.  Do not use any sheets or blankets that are too big for your bed. They should not hang down onto the floor.  Have a firm chair that has side arms. You can use this for support while you get dressed.  Do not have throw rugs and other things on the floor that can make you trip. What can I do in the kitchen?  Clean up any spills right away.  Avoid walking on wet floors.  Keep items that you use a lot in easy-to-reach places.  If you need to reach something above you, use a strong step stool that has a grab bar.  Keep electrical cords out of the way.  Do not use floor polish or wax that makes floors slippery. If you must use wax, use non-skid floor wax.  Do not have throw rugs and other things on the floor that can make you trip. What can I do  with my stairs?  Do not leave any items on the stairs.  Make sure that there are handrails on both sides of the stairs and use them. Fix handrails that are broken or loose. Make sure that handrails are as long as the stairways.  Check any carpeting to make sure that it is firmly attached to the stairs. Fix any carpet that is loose or worn.  Avoid having throw rugs at the top or bottom of the stairs. If you do have throw rugs, attach them to the floor with carpet tape.  Make sure that you have a light switch at the top of the stairs and the bottom of the stairs. If you do not have them, ask someone to add them for you. What else can I do to help prevent falls?  Wear shoes that:  Do not have high heels.  Have rubber bottoms.  Are comfortable and fit you well.  Are closed at the toe. Do not wear sandals.  If you use a stepladder:  Make sure that it is fully opened. Do not climb a closed  stepladder.  Make sure that both sides of the stepladder are locked into place.  Ask someone to hold it for you, if possible.  Clearly mark and make sure that you can see:  Any grab bars or handrails.  First and last steps.  Where the edge of each step is.  Use tools that help you move around (mobility aids) if they are needed. These include:  Canes.  Walkers.  Scooters.  Crutches.  Turn on the lights when you go into a dark area. Replace any light bulbs as soon as they burn out.  Set up your furniture so you have a clear path. Avoid moving your furniture around.  If any of your floors are uneven, fix them.  If there are any pets around you, be aware of where they are.  Review your medicines with your doctor. Some medicines can make you feel dizzy. This can increase your chance of falling. Ask your doctor what other things that you can do to help prevent falls. This information is not intended to replace advice given to you by your health care provider. Make sure you discuss any questions you have with your health care provider. Document Released: 08/13/2009 Document Revised: 03/24/2016 Document Reviewed: 11/21/2014 Elsevier Interactive Patient Education  2017 [employer].

## 2019-08-12 ENCOUNTER — Ambulatory Visit: Payer: Medicare Other

## 2019-08-14 ENCOUNTER — Other Ambulatory Visit: Payer: Self-pay | Admitting: Family Medicine

## 2019-08-22 ENCOUNTER — Encounter: Payer: Self-pay | Admitting: Family Medicine

## 2019-08-22 ENCOUNTER — Other Ambulatory Visit: Payer: Self-pay

## 2019-08-22 ENCOUNTER — Ambulatory Visit (INDEPENDENT_AMBULATORY_CARE_PROVIDER_SITE_OTHER): Payer: Medicare Other | Admitting: Family Medicine

## 2019-08-22 VITALS — BP 136/88 | HR 64 | Temp 98.2°F | Resp 15 | Ht 64.0 in | Wt 175.1 lb

## 2019-08-22 DIAGNOSIS — H6690 Otitis media, unspecified, unspecified ear: Secondary | ICD-10-CM | POA: Diagnosis not present

## 2019-08-22 MED ORDER — AMOXICILLIN-POT CLAVULANATE 875-125 MG PO TABS: 1.0000 | ORAL_TABLET | Freq: Two times a day (BID) | ORAL | 0 refills | Status: AC

## 2019-08-22 NOTE — Progress Notes (Signed)
Subjective:     Patient ID: Laura [name], female   DOB: 10-Feb-1951, 68 y.o.   MRN: [id number]  Laura [name] presents for Ear Pain and Dizziness  [name] reports that she has new onset left ear discomfort achiness and pain.  Reports that she has is commonly after washing her hair.  But she has eardrops at home to help clear out the ear.  But this time is not working.  She reports that she is getting dizziness from the ear pain laying in bed makes it a lot worse.  She feels very woozy.  Discomfort is a 7 out of 10 today in the office.  She denies any other signs or symptoms of infection or allergies.    Today patient denies signs and symptoms of COVID 19 infection including fever, chills, cough, shortness of breath, and headache.  Past Medical, Surgical, Social History, Allergies, and Medications have been Reviewed.   Past Medical History:  Diagnosis Date  . Anxiety   . At low risk for fall 08/15/2018  . Cystitis   . Depression   . Gastritis   . GERD (gastroesophageal reflux disease)   . Hashimoto's thyroiditis    Hx   . Hyperlipidemia   . Hypertension   . Hypothyroidism   . Normocytic anemia 2009 Hb 9.9-11.1  . Osteopenia   . Shoulder pain   . Urinary incontinence    Past Surgical History:  Procedure Laterality Date  . Bilateral tubal ligation    . BREAST EXCISIONAL BIOPSY  2010   Excisional biopsy of benign left breast mass -lopoma   . BREAST SURGERY     left nreast biopsy  . COLONOSCOPY  08/26/2011   SLF: 1. Internal hemorrhoids  . COLONOSCOPY N/A 10/06/2015   WUJ:[id number]:anal fissure or internal hemorrhoids/mild sigmoid colitis. benign colonic path   . colonscopy  2005   Dr. Katrinka BlazingSmith  . ESOPHAGOGASTRODUODENOSCOPY  12/2009   chronic gastritis  . ESOPHAGOGASTRODUODENOSCOPY N/A 05/17/2017   Dr. Darrick PennaFields; gastritis, normal small bowel biopsies, multiple gastric polyps. fundic gland polyps  . GIVENS CAPSULE STUDY N/A 05/30/2017   gastritis, no source for anemia identified  .  left knee surgery Left March 1,2017  . PARTIAL KNEE ARTHROPLASTY Left 08/09/2017   Procedure: LEFT UNICOMPARTMENTAL KNEE;  Surgeon: Ollen GrossAluisio, Frank, MD;  Location: [facility];  Service: Orthopedics;  Laterality: Left;  . Resection of left lobe of thyroid    . right carpal tunnel release    . rt. knee athroscopy  2004  . SAVORY DILATION N/A 05/17/2017   Procedure: SAVORY DILATION;  Surgeon: West BaliFields, Sandi L, MD;  Location: [facility];  Service: Endoscopy;  Laterality: N/A;  . TOTAL ABDOMINAL HYSTERECTOMY  1994  . UMBILICAL HERNIA REPAIR    . Urethral dilation for stenosis  2009   Social History   Socioeconomic History  . Marital status: Married    Spouse name: Not on file  . Number of children: 3  . Years of education: Not on file  . Highest education level: Not on file  Occupational History  . 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 Financial resource strain: Not on file  . Food insecurity    Worry: Not on file    Inability: Not on file  . Transportation needs    Medical: Not on file    Non-medical: Not on file  Tobacco Use  . Smoking status: Former Smoker    Packs/day: 0.25  Years: 1.00    Pack years: 0.25    Types: Cigarettes    Quit date: 08/25/1972    Years since quitting: 47.0  . Smokeless tobacco: Never Used  Substance and Sexual Activity  . Alcohol use: No  . Drug use: No  . Sexual activity: Yes    Birth control/protection: Surgical    Comment: married  Lifestyle  . Physical activity    Days per week: Not on file    Minutes per session: Not on file  . Stress: Not on file  Relationships  . Social Herbalist on phone: Not on file    Gets together: Not on file    Attends religious service: Not on file    Active member of club or organization: Not on file    Attends meetings of clubs or organizations: Not on file    Relationship status: Not on file  . Intimate partner violence    Fear of current or ex partner: Not on  file    Emotionally abused: Not on file    Physically abused: Not on file    Forced sexual activity: Not on file  Other Topics Concern  . Not on file  Social History Narrative  . Not on file    Outpatient Encounter Medications as of 08/22/2019  Medication Sig  . acetaminophen (TYLENOL) 500 MG tablet Take 1,000 mg by mouth every 8 (eight) hours as needed (for pain.).   Marland Kitchen amLODipine (NORVASC) 2.5 MG tablet TAKE 1 TABLET(2.5 MG) BY MOUTH DAILY  . atorvastatin (LIPITOR) 20 MG tablet TAKE 1 TABLET BY MOUTH  DAILY  . citalopram (CELEXA) 20 MG tablet TAKE 1 TABLET BY MOUTH  DAILY  . gabapentin (NEURONTIN) 100 MG capsule TAKE 1 CAPSULE(100 MG) BY MOUTH AT BEDTIME  . hydrochlorothiazide (HYDRODIURIL) 25 MG tablet TAKE 1 TABLET BY MOUTH  DAILY  . hydrOXYzine (ATARAX/VISTARIL) 25 MG tablet Take 1 tablet (25 mg total) by mouth 3 (three) times daily as needed.  Marland Kitchen levothyroxine (SYNTHROID, LEVOTHROID) 112 MCG tablet TAKE 1 TABLET(112 MCG) BY MOUTH DAILY BEFORE BREAKFAST  . linaclotide (LINZESS) 72 MCG capsule Take 1 capsule (72 mcg total) by mouth daily before breakfast. (Patient taking differently: Take 72 mcg by mouth as needed. )  . loratadine (CLARITIN) 10 MG tablet Take 1 tablet (10 mg total) by mouth daily. (Patient taking differently: Take 10 mg by mouth daily as needed for allergies. )  . pantoprazole (PROTONIX) 40 MG tablet TAKE 1 TABLET BY MOUTH  TWICE DAILY  . Polyethyl Glycol-Propyl Glycol (SYSTANE) 0.4-0.3 % SOLN Apply 1 drop to eye 3 (three) times daily as needed (for dry eyes.).  Marland Kitchen potassium chloride (K-DUR) 10 MEQ tablet Take 1 tablet (10 mEq total) by mouth 2 (two) times daily.  . Probiotic Product (PROBIOTIC ADVANCED PO) Take 1 capsule by mouth daily.  . traMADol (ULTRAM) 50 MG tablet Take one tablet by mouth once daily, as needed, for breast pain  . triamcinolone cream (KENALOG) 0.1 % Apply twice daily to rash  On right leg for 5 days , then as needed  . amoxicillin-clavulanate  (AUGMENTIN) 875-125 MG tablet Take 1 tablet by mouth 2 (two) times daily for 5 days.   No facility-administered encounter medications on file as of 08/22/2019.    Allergies  Allergen Reactions  . Cozaar [Losartan Potassium] Cough  . Dilaudid [Hydromorphone Hcl] Other (See Comments)    Confusion;altered state of mind  . Levaquin [Levofloxacin] Other (See Comments)  Dry throat, no raste  . Nsaids Other (See Comments)    REACTION: per GI pt no longer to use NSAIDS REACTION: per GI pt no longer to use NSAIDS  . Promethazine     Legs itching, RESTLESS LEGS    Review of Systems  Constitutional: Negative for chills and fever.  HENT: Positive for ear pain. Negative for ear discharge.   Eyes: Negative.   Respiratory: Negative.   Cardiovascular: Negative.   Gastrointestinal: Negative.   Endocrine: Negative.   Genitourinary: Negative.   Musculoskeletal: Negative.   Skin: Negative.   Allergic/Immunologic: Negative.   Neurological: Positive for dizziness.  Hematological: Negative.   Psychiatric/Behavioral: Negative.   All other systems reviewed and are negative.      Objective:     BP 136/88   Pulse 64   Temp 98.2 F (36.8 C) (Oral)   Resp 15   Ht 5\' 4"  (1.626 m)   Wt 175 lb 1.3 oz (79.4 kg)   SpO2 97%   BMI 30.05 kg/m   Physical Exam Vitals signs and nursing note reviewed.  Constitutional:      Appearance: Normal appearance. She is well-developed, well-groomed and overweight.  HENT:     Head: Normocephalic and atraumatic.     Right Ear: Hearing, tympanic membrane, ear canal and external ear normal.     Left Ear: Hearing, ear canal and external ear normal. No decreased hearing noted. Tenderness present. Tympanic membrane is injected.     Nose: Nose normal.     Mouth/Throat:     Mouth: Mucous membranes are moist.     Pharynx: Oropharynx is clear.  Eyes:     General:        Right eye: No discharge.        Left eye: No discharge.     Conjunctiva/sclera:  Conjunctivae normal.  Neck:     Musculoskeletal: Normal range of motion and neck supple.  Cardiovascular:     Rate and Rhythm: Normal rate and regular rhythm.     Pulses: Normal pulses.     Heart sounds: Normal heart sounds.  Pulmonary:     Effort: Pulmonary effort is normal.     Breath sounds: Normal breath sounds.  Musculoskeletal: Normal range of motion.  Lymphadenopathy:     Cervical: No cervical adenopathy.  Skin:    General: Skin is warm.  Neurological:     General: No focal deficit present.     Mental Status: She is alert and oriented to person, place, and time.  Psychiatric:        Attention and Perception: Attention normal.        Mood and Affect: Mood normal.        Speech: Speech normal.        Behavior: Behavior normal. Behavior is cooperative.        Thought Content: Thought content normal.        Cognition and Memory: Cognition normal.        Judgment: Judgment normal.        Assessment and Plan        1. Acute otitis media, unspecified otitis media type Signs and symptoms are consistent with acute otitis media.  Will be providing coverage for this.  Advised that if her symptoms do not resolve after completing the full course of antibiotics that she is to return to the clinic.  Encouraged her to wear earplugs when she is washing her hair.  To prevent this from happening in the  future.  - amoxicillin-clavulanate (AUGMENTIN) 875-125 MG tablet; Take 1 tablet by mouth 2 (two) times daily for 5 days.  Dispense: 10 tablet; Refill: 0   Follow-up: 10/21/2019  Freddy Finner, DNP, AGNP-BC [facility] [address], [address] [place], [place] [address] Office Hours: Mon-Thurs 8 am-5 pm; Fri 8 am-12 pm Office Phone:  [phone]  Office Fax: [phone]

## 2019-08-22 NOTE — Patient Instructions (Signed)
  I appreciate the opportunity to provide you with the care for your health and wellness. Today we discussed:  Ear infection  Follow up: 10/21/2019  No labs or referrals today  I have sent in some medication for you to take to help with the ear ache that is starting.  HAVE A WONDERFUL WEDDING WEEKEND!   Stay safe and healthy.   Please continue to practice social distancing to keep you, your family, and our community safe.  If you must go out, please wear a mask and practice good handwashing.  It was a pleasure to see you and I look forward to continuing to work together on your health and well-being. Please do not hesitate to call the office if you need care or have questions about your care.  Have a wonderful day and week. With Gratitude, Cherly Beach, DNP, AGNP-BC

## 2019-10-11 ENCOUNTER — Ambulatory Visit (INDEPENDENT_AMBULATORY_CARE_PROVIDER_SITE_OTHER): Payer: Medicare Other | Admitting: Family Medicine

## 2019-10-11 ENCOUNTER — Encounter: Payer: Self-pay | Admitting: Family Medicine

## 2019-10-11 ENCOUNTER — Other Ambulatory Visit: Payer: Self-pay

## 2019-10-11 ENCOUNTER — Telehealth: Payer: Self-pay | Admitting: *Deleted

## 2019-10-11 VITALS — Ht 64.0 in | Wt 176.0 lb

## 2019-10-11 DIAGNOSIS — M170 Bilateral primary osteoarthritis of knee: Secondary | ICD-10-CM

## 2019-10-11 MED ORDER — TRAMADOL HCL 50 MG PO TABS: 50.0000 mg | ORAL_TABLET | Freq: Three times a day (TID) | ORAL | 0 refills | Status: AC | PRN

## 2019-10-11 NOTE — Progress Notes (Signed)
Virtual Visit via Telephone Note   This visit type was conducted due to national recommendations for restrictions regarding the COVID-19 Pandemic (e.g. social distancing) in an effort to limit this patient's exposure and mitigate transmission in our community.  Due to her co-morbid illnesses, this patient is at least at moderate risk for complications without adequate follow up.  This format is felt to be most appropriate for this patient at this time.  The patient did not have access to video technology/had technical difficulties with video requiring transitioning to audio format only (telephone).  All issues noted in this document were discussed and addressed.  No physical exam could be performed with this format.    Evaluation Performed:  Follow-up visit  Date:  10/11/2019   ID:  BLANNIE SHEDLOCK, DOB 06-01-1951, MRN [id number]  Patient Location: Home Provider Location: Office  Location of Patient: Home Location of Provider: Telehealth Consent was obtain for visit to be over via telehealth. I verified that I am speaking with the correct person using two identifiers.  PCP:  Kerri Perches, MD   Chief Complaint: Knee pain  History of Present Illness:    LISMARY KIEHN is a 68 y.o. female with xtensive history of hypertension, GERD, hypothyroidism, osteopenia, osteoarthritis of knee.  Presents today with new onset of increased knee pain.  She believes it is related to the weather being cold.  She reports that is not uncommon for her to have flareups like this when the weather acts up.  She denies having any relief with ongoing topicals and or Tylenol use.  Reports Ultram usually helps her when she gets to a point where nothing else helps.  She denies having any trauma and/or injury to the site.  Reports that her pain is an 8 to a 9 out of 10 when it is bothering her the most.  Lots of achiness.  The patient does not have symptoms concerning for COVID-19 infection (fever, chills,  cough, or new shortness of breath).   Past Medical, Surgical, Social History, Allergies, and Medications have been Reviewed.  Past Medical History:  Diagnosis Date  . Anxiety   . At low risk for fall 08/15/2018  . Cystitis   . Depression   . Gastritis   . GERD (gastroesophageal reflux disease)   . Hashimoto's thyroiditis    Hx   . Hyperlipidemia   . Hypertension   . Hypothyroidism   . Normocytic anemia 2009 Hb 9.9-11.1  . Osteopenia   . Shoulder pain   . Urinary incontinence    Past Surgical History:  Procedure Laterality Date  . Bilateral tubal ligation    . BREAST EXCISIONAL BIOPSY  2010   Excisional biopsy of benign left breast mass -lopoma   . BREAST SURGERY     left nreast biopsy  . COLONOSCOPY  08/26/2011   SLF: 1. Internal hemorrhoids  . COLONOSCOPY N/A 10/06/2015   EHO:ZYYQ fissure or internal hemorrhoids/mild sigmoid colitis. benign colonic path   . colonscopy  2005   Dr. Katrinka Blazing  . ESOPHAGOGASTRODUODENOSCOPY  12/2009   chronic gastritis  . ESOPHAGOGASTRODUODENOSCOPY N/A 05/17/2017   Dr. Darrick Penna; gastritis, normal small bowel biopsies, multiple gastric polyps. fundic gland polyps  . GIVENS CAPSULE STUDY N/A 05/30/2017   gastritis, no source for anemia identified  . left knee surgery Left March 1,2017  . PARTIAL KNEE ARTHROPLASTY Left 08/09/2017   Procedure: LEFT UNICOMPARTMENTAL KNEE;  Surgeon: Ollen Gross, MD;  Location: [facility];  Service: Orthopedics;  Laterality: Left;  . Resection of left lobe of thyroid    . right carpal tunnel release    . rt. knee athroscopy  2004  . SAVORY DILATION N/A 05/17/2017   Procedure: SAVORY DILATION;  Surgeon: West BaliFields, Sandi L, MD;  Location: [facility];  Service: Endoscopy;  Laterality: N/A;  . TOTAL ABDOMINAL HYSTERECTOMY  1994  . UMBILICAL HERNIA REPAIR    . Urethral dilation for stenosis  2009     No outpatient medications have been marked as taking for the 10/11/19 encounter (Office Visit) with Freddy FinnerMills, Aydan Levitz M, NP.       Allergies:   Cozaar [losartan potassium], Dilaudid [hydromorphone hcl], Levaquin [levofloxacin], Nsaids, and Promethazine   Social History   Tobacco Use  . Smoking status: Former Smoker    Packs/day: 0.25    Years: 1.00    Pack years: 0.25    Types: Cigarettes    Quit date: 08/25/1972    Years since quitting: 47.1  . Smokeless tobacco: Never Used  Substance Use Topics  . Alcohol use: No  . Drug use: No     Family Hx: The patient's family history includes Pneumonia in her mother. There is no history of Colon cancer, Anesthesia problems, Hypotension, Malignant hyperthermia, or Pseudochol deficiency.  ROS:   Please see the history of present illness.    All other systems reviewed and are negative.   Labs/Other Tests and Data Reviewed:    Recent Labs: 01/01/2019: TSH 1.66 01/10/2019: ALT 22; BUN 13; Creat 1.02; Hemoglobin 10.2; Platelets 253; Potassium 3.6; Sodium 138   Recent Lipid Panel Lab Results  Component Value Date/Time   CHOL 184 01/10/2019 09:58 AM   CHOL 185 06/21/2017 11:13 AM   TRIG 98 01/10/2019 09:58 AM   HDL 57 01/10/2019 09:58 AM   HDL 46 06/21/2017 11:13 AM   CHOLHDL 3.2 01/10/2019 09:58 AM   LDLCALC 108 (H) 01/10/2019 09:58 AM    Wt Readings from Last 3 Encounters:  10/11/19 176 lb (79.8 kg)  08/22/19 175 lb 1.3 oz (79.4 kg)  08/07/19 178 lb (80.7 kg)     Objective:    Vital Signs:  Ht 5\' 4"  (1.626 m)   Wt 176 lb (79.8 kg)   BMI 30.21 kg/m    VITAL SIGNS:  reviewed GEN:  Alert and oriented RESPIRATORY:  No shortness of breath noted in conversation PSYCH:  Normal affect and mood, good communication  ASSESSMENT & PLAN:    1. Primary osteoarthritis of both knees Has history of osteoarthritis of knees.  Will give short course of Ultram.  Strongly advised her to follow-up with orthopedics for possible injections and/or surgeries that could be performed as the ongoing use of Ultram is not recommended for both kidney use or pain relief if  possible.  Did not want to try Voltaren or any other topical agent reports that those did not help her as much.  - traMADol (ULTRAM) 50 MG tablet; Take 1 tablet (50 mg total) by mouth every 8 (eight) hours as needed for up to 5 days.  Dispense: 15 tablet; Refill: 0  Time:   Today, I have spent 10 minutes with the patient with telehealth technology discussing the above problems.     Medication Adjustments/Labs and Tests Ordered: Current medicines are reviewed at length with the patient today.  Concerns regarding medicines are outlined above.   Tests Ordered: No orders of the defined types were placed in this encounter.   Medication Changes: No orders of the  defined types were placed in this encounter.   Disposition:  Follow up as scheduled already.  Signed, Perlie Mayo, NP  10/11/2019 12:04 PM     [place]

## 2019-10-11 NOTE — Patient Instructions (Addendum)
  I appreciate the opportunity to provide you with care for your health and wellness. Today we discussed: Knee pain  Follow up: 10/21/2019  No labs or referrals today  Pick up Ultram and take as directed.  I hope that this can help with some of your discomfort.  Possible need for referral back to orthopedics to see if there is any other options that you can do to help with this discomfort.  I hope you have a wonderful, happy, safe, and healthy Holiday Season! See you in the New Year :)  Please continue to practice social distancing to keep you, your family, and our community safe.  If you must go out, please wear a mask and practice good handwashing.  It was a pleasure to see you and I look forward to continuing to work together on your health and well-being. Please do not hesitate to call the office if you need care or have questions about your care.  Have a wonderful day and week. With Gratitude, Cherly Beach, DNP, AGNP-BC

## 2019-10-11 NOTE — Telephone Encounter (Signed)
Routing to [place] for approval

## 2019-10-11 NOTE — Telephone Encounter (Signed)
Added pt by phone today at 1140 for her knees

## 2019-10-11 NOTE — Telephone Encounter (Signed)
Pt called needing tramedal called in both of her knees are hurting her due to the weather

## 2019-10-15 DIAGNOSIS — E785 Hyperlipidemia, unspecified: Secondary | ICD-10-CM | POA: Diagnosis not present

## 2019-10-15 DIAGNOSIS — E559 Vitamin D deficiency, unspecified: Secondary | ICD-10-CM | POA: Diagnosis not present

## 2019-10-15 DIAGNOSIS — I10 Essential (primary) hypertension: Secondary | ICD-10-CM | POA: Diagnosis not present

## 2019-10-15 DIAGNOSIS — D649 Anemia, unspecified: Secondary | ICD-10-CM | POA: Diagnosis not present

## 2019-10-16 ENCOUNTER — Encounter: Payer: Self-pay | Admitting: Family Medicine

## 2019-10-16 LAB — CBC
HCT: 36.9 % (ref 35.0–45.0)
Hemoglobin: 11.7 g/dL (ref 11.7–15.5)
MCH: 27.6 pg (ref 27.0–33.0)
MCHC: 31.7 g/dL — ABNORMAL LOW (ref 32.0–36.0)
MCV: 87 fL (ref 80.0–100.0)
MPV: 10.1 fL (ref 7.5–12.5)
Platelets: 274 10*3/uL (ref 140–400)
RBC: 4.24 10*6/uL (ref 3.80–5.10)
RDW: 11.5 % (ref 11.0–15.0)
WBC: 4.8 10*3/uL (ref 3.8–10.8)

## 2019-10-16 LAB — COMPLETE METABOLIC PANEL WITH GFR
AG Ratio: 1.3 (calc) (ref 1.0–2.5)
ALT: 16 U/L (ref 6–29)
AST: 18 U/L (ref 10–35)
Albumin: 4.2 g/dL (ref 3.6–5.1)
Alkaline phosphatase (APISO): 64 U/L (ref 37–153)
BUN/Creatinine Ratio: 13 (calc) (ref 6–22)
BUN: 13 mg/dL (ref 7–25)
CO2: 30 mmol/L (ref 20–32)
Calcium: 10.1 mg/dL (ref 8.6–10.4)
Chloride: 99 mmol/L (ref 98–110)
Creat: 1 mg/dL — ABNORMAL HIGH (ref 0.50–0.99)
GFR, Est African American: 67 mL/min/{1.73_m2} (ref >=60)
GFR, Est Non African American: 57 mL/min/{1.73_m2} — ABNORMAL LOW (ref >=60)
Globulin: 3.3 g/dL (calc) (ref 1.9–3.7)
Glucose, Bld: 90 mg/dL (ref 65–99)
Potassium: 3.6 mmol/L (ref 3.5–5.3)
Sodium: 139 mmol/L (ref 135–146)
Total Bilirubin: 0.6 mg/dL (ref 0.2–1.2)
Total Protein: 7.5 g/dL (ref 6.1–8.1)

## 2019-10-16 LAB — LIPID PANEL
Cholesterol: 214 mg/dL — ABNORMAL HIGH (ref <200)
HDL: 47 mg/dL — ABNORMAL LOW (ref >=50)
LDL Cholesterol (Calc): 136 mg/dL (calc) — ABNORMAL HIGH
Non-HDL Cholesterol (Calc): 167 mg/dL (calc) — ABNORMAL HIGH (ref <130)
Total CHOL/HDL Ratio: 4.6 (calc) (ref <5.0)
Triglycerides: 169 mg/dL — ABNORMAL HIGH (ref <150)

## 2019-10-16 LAB — VITAMIN D 25 HYDROXY (VIT D DEFICIENCY, FRACTURES): Vit D, 25-Hydroxy: 78 ng/mL (ref 30–100)

## 2019-10-21 ENCOUNTER — Ambulatory Visit (HOSPITAL_COMMUNITY)
Admission: RE | Admit: 2019-10-21 | Discharge: 2019-10-21 | Disposition: A | Discharge status: Home / Self Care | Payer: Medicare Other | Source: Ambulatory Visit | Attending: Family Medicine | Admitting: Family Medicine

## 2019-10-21 ENCOUNTER — Ambulatory Visit (INDEPENDENT_AMBULATORY_CARE_PROVIDER_SITE_OTHER): Payer: Medicare Other | Admitting: Family Medicine

## 2019-10-21 ENCOUNTER — Other Ambulatory Visit: Payer: Self-pay

## 2019-10-21 ENCOUNTER — Encounter: Payer: Self-pay | Admitting: Family Medicine

## 2019-10-21 VITALS — BP 130/84 | HR 72 | Temp 97.5°F | Resp 15 | Ht 64.0 in | Wt 175.0 lb

## 2019-10-21 DIAGNOSIS — I10 Essential (primary) hypertension: Secondary | ICD-10-CM

## 2019-10-21 DIAGNOSIS — Z1231 Encounter for screening mammogram for malignant neoplasm of breast: Secondary | ICD-10-CM | POA: Insufficient documentation

## 2019-10-21 DIAGNOSIS — Z0000 Encounter for general adult medical examination without abnormal findings: Secondary | ICD-10-CM | POA: Diagnosis not present

## 2019-10-21 DIAGNOSIS — M7551 Bursitis of right shoulder: Secondary | ICD-10-CM

## 2019-10-21 DIAGNOSIS — M5410 Radiculopathy, site unspecified: Secondary | ICD-10-CM | POA: Diagnosis not present

## 2019-10-21 DIAGNOSIS — E785 Hyperlipidemia, unspecified: Secondary | ICD-10-CM

## 2019-10-21 DIAGNOSIS — E559 Vitamin D deficiency, unspecified: Secondary | ICD-10-CM

## 2019-10-21 IMAGING — MG DIGITAL SCREENING BILAT W/ TOMO W/ CAD
6 of 10 series · 6 of 30 positions shown · non-contrast
Comparison: Previous exam(s).

CLINICAL DATA: Screening.

EXAM:
DIGITAL SCREENING BILATERAL MAMMOGRAM WITH TOMO AND CAD

[L MLO synth-2D]
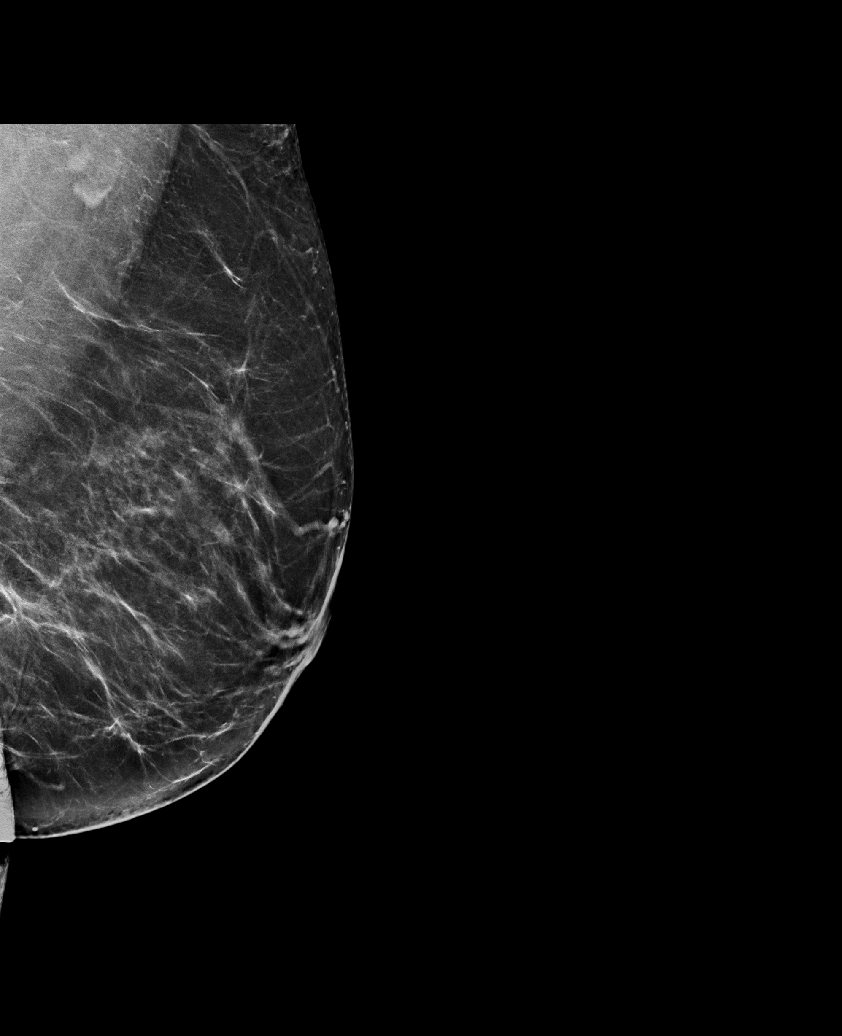

[L CC synth-2D]
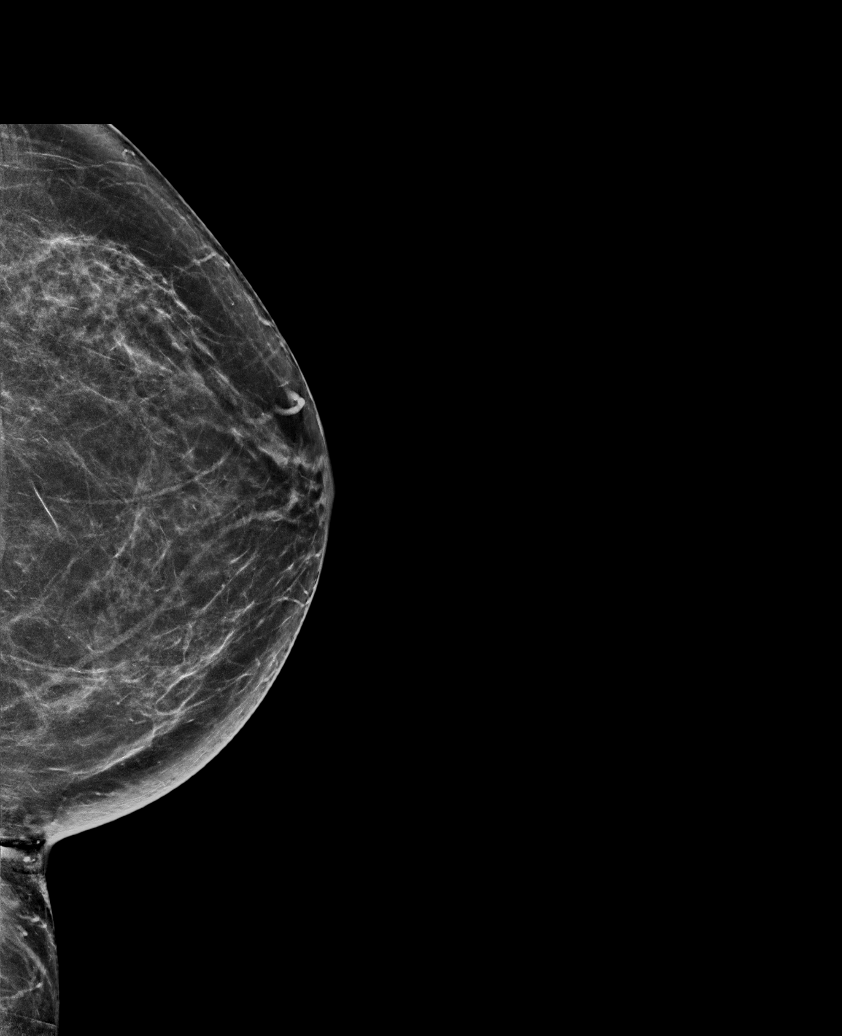

[R CC synth-2D (1 of 2)]
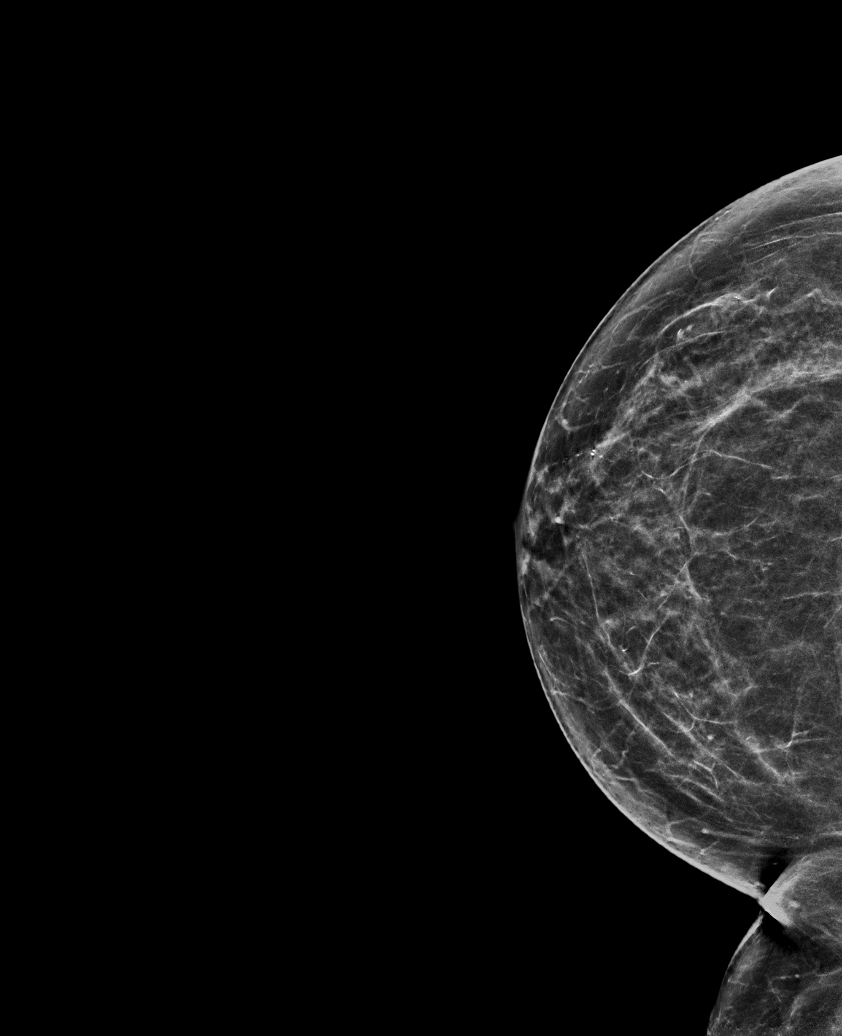

[R CC synth-2D (2 of 2)]
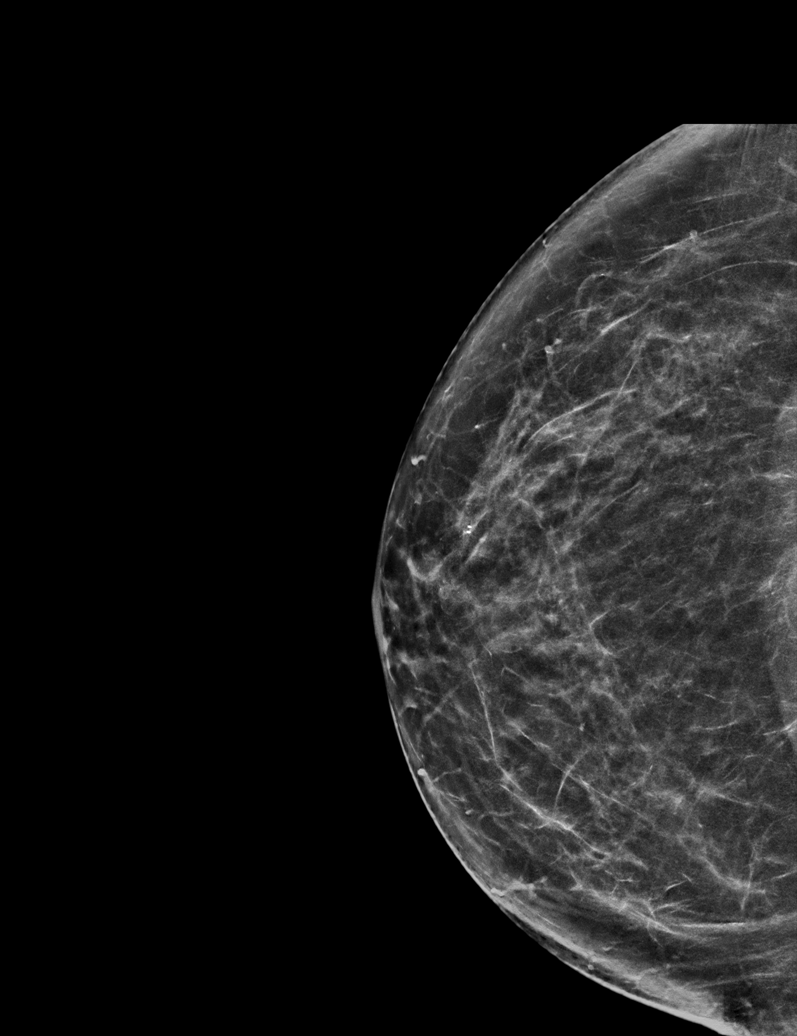

[R MLO synth-2D]
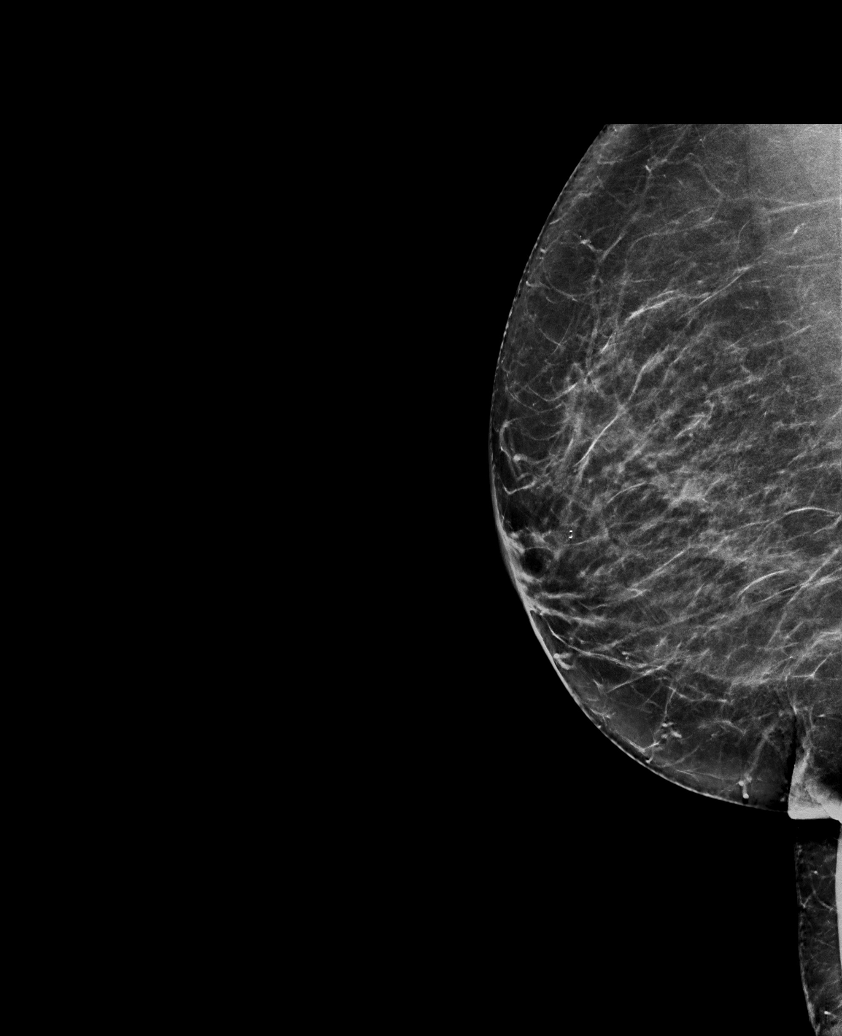

[L CC tomo · tomo slice 35/70.0]
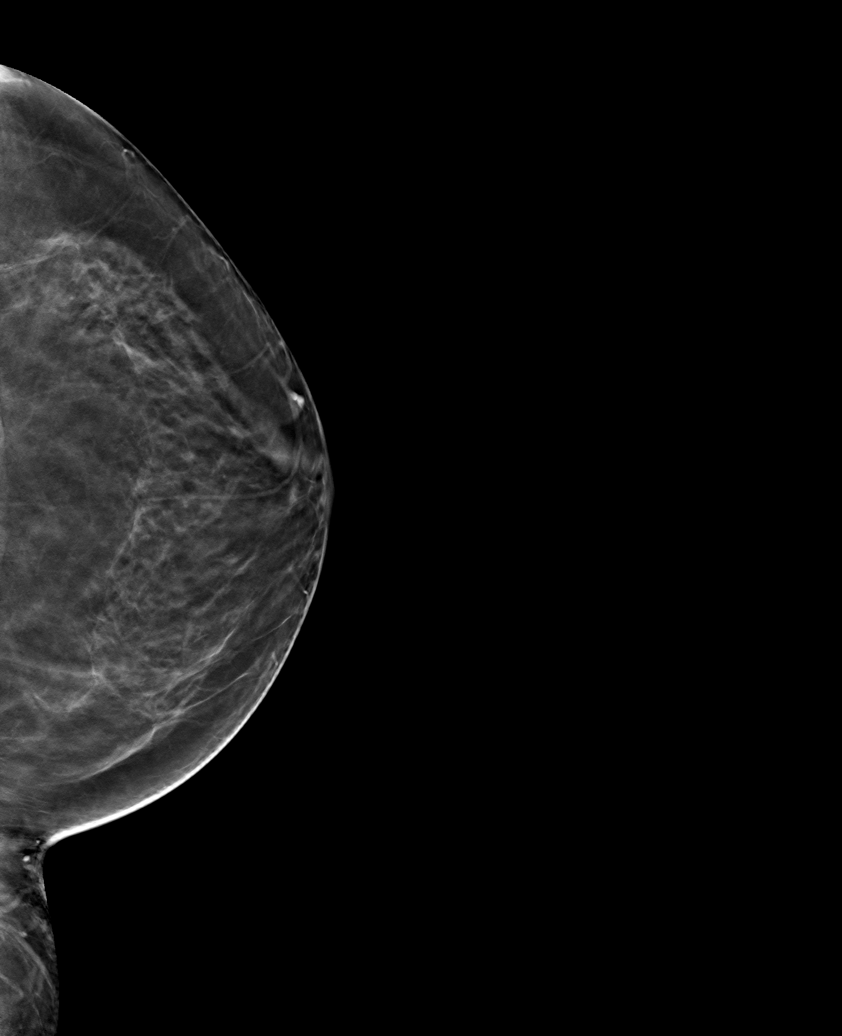

[6 of 30 positions shown; findings below may reference images not displayed]

ACR Breast Density Category b: There are scattered areas of
fibroglandular density.
FINDINGS: There are no findings suspicious for malignancy. Images were
processed with CAD.
IMPRESSION: No mammographic evidence of malignancy. A result letter of this
screening mammogram will be mailed directly to the patient.

RECOMMENDATION:
Screening mammogram in one year. (Code:[TQ])

BI-RADS CATEGORY  1: Negative.

## 2019-10-21 MED ORDER — METHYLPREDNISOLONE ACETATE 80 MG/ML IJ SUSP
80.0000 mg | Freq: Once | INTRAMUSCULAR | Status: AC
  Administered 2019-10-21: 10:00:00 80 mg via INTRAMUSCULAR

## 2019-10-21 MED ORDER — GABAPENTIN 100 MG PO CAPS: 100.0000 mg | ORAL_CAPSULE | Freq: Every day | ORAL | 3 refills | Status: DC

## 2019-10-21 MED ORDER — PREDNISONE 20 MG PO TABS: ORAL_TABLET | ORAL | 0 refills | Status: DC

## 2019-10-21 MED ORDER — ATORVASTATIN CALCIUM 40 MG PO TABS: 40.0000 mg | ORAL_TABLET | Freq: Every day | ORAL | 3 refills | Status: DC

## 2019-10-21 NOTE — Patient Instructions (Signed)
F/U in office with MD in 4 months, call if you need  Me sooner  Depo medrol 80 mg IM in office today for bursitis right shoulder and back pain, also 1 week of prednisone is at your local pharmacy, start today. I have sent bedtime gabapentin to mail order or nightly use for nerve pain  Cholesterol is too high, please change food choice that has a lot of fat, and I have increased atorvastatin dose to 40 mg daily, you may trake two 20 mg tablets  Till new dose comes  Please get fasting lipid, cmp and eGFR, VIt D 1 week before follow up  Thanks for choosing [facility], we consider it a privelige to serve you.   It is important that you exercise regularly at least 30 minutes 5 times a week. If you develop chest pain, have severe difficulty breathing, or feel very tired, stop exercising immediately and seek medical attention  Think about what you will eat, plan ahead. Choose " clean, green, fresh or frozen" over canned, processed or packaged foods which are more sugary, salty and fatty. 70 to 75% of food eaten should be vegetables and fruit. Three meals at set times with snacks allowed between meals, but they must be fruit or vegetables. Aim to eat over a 12 hour period , example 7 am to 7 pm, and STOP after  your last meal of the day. Drink water,generally about 64 ounces per day, no other drink is as healthy. Fruit juice is best enjoyed in a healthy way, by EATING the fruit. Thanks for choosing [facility], we consider it a privelige to serve you.

## 2019-10-21 NOTE — Assessment & Plan Note (Signed)
2 week history, pred and depo medrol

## 2019-10-21 NOTE — Assessment & Plan Note (Addendum)
2 week flare , no inciting trauma, depro merol 80 mg IM in ooffice followed by short course of oral prednisone, start daily gabapentin at bednight

## 2019-10-21 NOTE — Assessment & Plan Note (Signed)
Annual exam as documented. Counseling done  re healthy lifestyle involving commitment to 150 minutes exercise per week, heart healthy diet, and attaining healthy weight.The importance of adequate sleep also discussed.  Immunization and cancer screening needs are specifically addressed at this visit.  

## 2019-10-21 NOTE — Progress Notes (Signed)
    Laura [name]     MRN: [id number]      DOB: [birth year]  HPI: Patient is in for annual physical exam. 2 week right shoulder pain and swelling, no trauma 2 week mid back pain to left leg, burning rated at 8 Recent labs, if available are reviewed. Immunization is reviewed , and  updated if needed.   PE: BP 130/84   Pulse 72   Temp (!) 97.5 F (36.4 C)   Resp 15   Ht 5\' 4"  (1.626 m)   Wt 175 lb (79.4 kg)   SpO2 97%   BMI 30.04 kg/m   Pleasant  female, alert and oriented x 3, in no cardio-pulmonary distress. Afebrile. HEENT No facial trauma or asymetry. Sinuses non tender.  Extra occullar muscles intact.. External ears normal, . Neck: supple, no adenopathy,JVD or thyromegaly.No bruits.  Chest: Clear to ascultation bilaterally.No crackles or wheezes. Non tender to palpation  Breast: Normal mammogram 06/2019 No asymetry,no masses or lumps. No tenderness. No nipple discharge or inversion. No axillary or supraclavicular adenopathy  Cardiovascular system; Heart sounds normal,  S1 and  S2 ,no S3.  No murmur, or thrill. Apical beat not displaced Peripheral pulses normal.  Abdomen: Soft, non tender, no organomegaly or masses. No bruits. Bowel sounds normal. No guarding, tenderness or rebound.   .   Musculoskeletal exam: decreased  ROM of lumbar  spine, and right  Shoulder, adequate in hips and kneees. No deformity ,swelling or crepitus noted. No muscle wasting or atrophy.   Neurologic: Cranial nerves 2 to 12 intact. Power, tone ,sensation and reflexes normal throughout. No disturbance in gait. No tremor.  Skin: Intact, no ulceration, erythema , scaling or rash noted. Pigmentation normal throughout  Psych; Normal mood and affect. Judgement and concentration normal   Assessment & Plan:  Annual physical exam Annual exam as documented. Counseling done  re healthy lifestyle involving commitment to 150 minutes exercise per week, heart healthy diet, and  attaining healthy weight.The importance of adequate sleep also discussed.  Immunization and cancer screening needs are specifically addressed at this visit.   Bursitis of right shoulder 2 week history, pred and depo medrol  Back pain with radiculopathy 2 week flare , no inciting trauma, depro merol 80 mg IM in ooffice followed by short course of oral prednisone, start daily gabapentin at bednight

## 2019-10-23 ENCOUNTER — Encounter: Payer: Self-pay | Admitting: Family Medicine

## 2019-11-15 ENCOUNTER — Ambulatory Visit
Admission: EM | Admit: 2019-11-15 | Discharge: 2019-11-15 | Disposition: A | Discharge status: Home / Self Care | Payer: Medicare Other | Attending: Emergency Medicine | Admitting: Emergency Medicine

## 2019-11-15 ENCOUNTER — Other Ambulatory Visit: Payer: Self-pay

## 2019-11-15 DIAGNOSIS — M5489 Other dorsalgia: Secondary | ICD-10-CM | POA: Diagnosis not present

## 2019-11-15 MED ORDER — CYCLOBENZAPRINE HCL 10 MG PO TABS: 10.0000 mg | ORAL_TABLET | Freq: Two times a day (BID) | ORAL | 0 refills | Status: DC | PRN

## 2019-11-15 MED ORDER — ACETAMINOPHEN 500 MG PO TABS: 500.0000 mg | ORAL_TABLET | Freq: Four times a day (QID) | ORAL | 0 refills | Status: AC | PRN

## 2019-11-15 MED ORDER — DEXAMETHASONE SODIUM PHOSPHATE 10 MG/ML IJ SOLN
20.0000 mg | Freq: Once | INTRAMUSCULAR | Status: AC
  Administered 2019-11-15: 20 mg via INTRAVENOUS

## 2019-11-15 MED ORDER — PREDNISONE 10 MG PO TABS: 20.0000 mg | ORAL_TABLET | Freq: Every day | ORAL | 0 refills | Status: DC

## 2019-11-15 NOTE — ED Provider Notes (Signed)
[facility]    CSN: [id number] Arrival date & time: 11/15/19  0801      History   Chief Complaint Chief Complaint  Patient presents with  . Back Pain    HPI Laura [name] is a 69 y.o. female.   Laura [name] 69 years old female presented to the urgent care with a complaint of back pain for the past 1 days.  It started 15 years ago after falling on work.  She localizes the pain to the bilateral low back.  She describes the pain as constant and achy.  She has tried OTC Tylenol  without relief.  Her symptoms are made worse with ROM.  She reports similar symptoms in the past.    The history is provided by the patient. No language interpreter was used.    Past Medical History:  Diagnosis Date  . Anxiety   . At low risk for fall 08/15/2018  . Cystitis   . Depression   . Gastritis   . GERD (gastroesophageal reflux disease)   . Hashimoto's thyroiditis    Hx   . Hyperlipidemia   . Hypertension   . Hypothyroidism   . Normocytic anemia 2009 Hb 9.9-11.1  . Osteopenia   . Shoulder pain   . Urinary incontinence     Patient Active Problem List   Diagnosis Date Noted  . Bursitis of right shoulder 10/21/2019  . Dermatitis 07/23/2019  . Obesity (BMI 30.0-34.9) 06/17/2019  . OA (osteoarthritis) of knee 08/09/2017  . Headache 01/06/2017  . Constipation 02/05/2016  . Allergic rhinitis 10/08/2014  . GAD (generalized anxiety disorder) 09/23/2014  . Annual physical exam 06/05/2014  . Back pain with radiculopathy 04/14/2014  . POSTSURGICAL HYPOTHYROIDISM 06/22/2010  . Normocytic anemia 08/04/2009  . GERD 04/20/2009  . Hyperlipidemia LDL goal <100 05/16/2008  . Essential hypertension 05/16/2008  . OSTEOPENIA 05/16/2008  . Urinary incontinence 05/16/2008    Past Surgical History:  Procedure Laterality Date  . Bilateral tubal ligation    . BREAST EXCISIONAL BIOPSY  2010   Excisional biopsy of benign left breast mass -lopoma   . BREAST SURGERY     left nreast biopsy   . COLONOSCOPY  08/26/2011   SLF: 1. Internal hemorrhoids  . COLONOSCOPY N/A 10/06/2015   ELF:YBOF fissure or internal hemorrhoids/mild sigmoid colitis. benign colonic path   . colonscopy  2005   Dr. Tamala Julian  . ESOPHAGOGASTRODUODENOSCOPY  12/2009   chronic gastritis  . ESOPHAGOGASTRODUODENOSCOPY N/A 05/17/2017   Dr. Oneida Alar; gastritis, normal small bowel biopsies, multiple gastric polyps. fundic gland polyps  . GIVENS CAPSULE STUDY N/A 05/30/2017   gastritis, no source for anemia identified  . left knee surgery Left March 1,2017  . PARTIAL KNEE ARTHROPLASTY Left 08/09/2017   Procedure: LEFT UNICOMPARTMENTAL KNEE;  Surgeon: Gaynelle Arabian, MD;  Location: [facility];  Service: Orthopedics;  Laterality: Left;  . Resection of left lobe of thyroid    . right carpal tunnel release    . rt. knee athroscopy  2004  . SAVORY DILATION N/A 05/17/2017   Procedure: SAVORY DILATION;  Surgeon: Danie Binder, MD;  Location: [facility];  Service: Endoscopy;  Laterality: N/A;  . TOTAL ABDOMINAL HYSTERECTOMY  1994  . UMBILICAL HERNIA REPAIR    . Urethral dilation for stenosis  2009    OB History   No obstetric history on file.      Home Medications    Prior to Admission medications   Medication Sig Start Date End Date  Taking? Authorizing Provider  acetaminophen (TYLENOL) 500 MG tablet Take 1 tablet (500 mg total) by mouth every 6 (six) hours as needed. 11/15/19   Carri Spillers, Zachery Dakins, FNP  amLODipine (NORVASC) 2.5 MG tablet TAKE 1 TABLET(2.5 MG) BY MOUTH DAILY 03/04/19   Kerri Perches, MD  atorvastatin (LIPITOR) 40 MG tablet Take 1 tablet (40 mg total) by mouth daily. 10/21/19   Kerri Perches, MD  citalopram (CELEXA) 20 MG tablet TAKE 1 TABLET BY MOUTH  DAILY 06/26/19   Freddy Finner, NP  cyclobenzaprine (FLEXERIL) 10 MG tablet Take 1 tablet (10 mg total) by mouth 2 (two) times daily as needed for muscle spasms. 11/15/19   Skylor Hughson, Zachery Dakins, FNP  gabapentin (NEURONTIN) 100 MG capsule Take 1  capsule (100 mg total) by mouth at bedtime. 10/21/19   Kerri Perches, MD  hydrochlorothiazide (HYDRODIURIL) 25 MG tablet TAKE 1 TABLET BY MOUTH  DAILY 08/14/19   Kerri Perches, MD  hydrOXYzine (ATARAX/VISTARIL) 25 MG tablet Take 1 tablet (25 mg total) by mouth 3 (three) times daily as needed. 07/23/19   Kerri Perches, MD  levothyroxine (SYNTHROID, LEVOTHROID) 112 MCG tablet TAKE 1 TABLET(112 MCG) BY MOUTH DAILY BEFORE BREAKFAST 01/09/19   Roma Kayser, MD  linaclotide (LINZESS) 72 MCG capsule Take 1 capsule (72 mcg total) by mouth daily before breakfast. Patient taking differently: Take 72 mcg by mouth as needed.  09/27/17   Gelene Mink, NP  loratadine (CLARITIN) 10 MG tablet Take 1 tablet (10 mg total) by mouth daily. Patient taking differently: Take 10 mg by mouth daily as needed for allergies.  01/04/17   Kerri Perches, MD  pantoprazole (PROTONIX) 40 MG tablet TAKE 1 TABLET BY MOUTH  TWICE DAILY 06/26/19   Freddy Finner, NP  Polyethyl Glycol-Propyl Glycol (SYSTANE) 0.4-0.3 % SOLN Apply 1 drop to eye 3 (three) times daily as needed (for dry eyes.).    [provider]  potassium chloride (K-DUR) 10 MEQ tablet Take 1 tablet (10 mEq total) by mouth 2 (two) times daily. 09/20/17   Kerri Perches, MD  predniSONE (DELTASONE) 10 MG tablet Take 2 tablets (20 mg total) by mouth daily. 11/15/19   Keimora Swartout, Zachery Dakins, FNP  Probiotic Product (PROBIOTIC ADVANCED PO) Take 1 capsule by mouth daily.    [provider]  triamcinolone cream (KENALOG) 0.1 % Apply twice daily to rash  On right leg for 5 days , then as needed 07/23/19   Kerri Perches, MD    Family History Family History  Problem Relation Age of Onset  . Pneumonia Mother   . Healthy Father   . Colon cancer Neg Hx   . Anesthesia problems Neg Hx   . Hypotension Neg Hx   . Malignant hyperthermia Neg Hx   . Pseudochol deficiency Neg Hx     Social History Social History   Tobacco Use    . Smoking status: Former Smoker    Packs/day: 0.25    Years: 1.00    Pack years: 0.25    Types: Cigarettes    Quit date: 08/25/1972    Years since quitting: 47.2  . Smokeless tobacco: Never Used  Substance Use Topics  . Alcohol use: No  . Drug use: No     Allergies   Cozaar [losartan potassium], Dilaudid [hydromorphone hcl], Levaquin [levofloxacin], Nsaids, and Promethazine   Review of Systems Review of Systems  Constitutional: Negative.   Respiratory: Negative.   Cardiovascular: Negative.  Musculoskeletal: Positive for back pain.  All other systems reviewed and are negative.    Physical Exam Triage Vital Signs ED Triage Vitals [11/15/19 0806]  Enc Vitals Group     BP (!) 143/74     Pulse Rate (!) 57     Resp 15     Temp 97.9 F (36.6 C)     Temp Source Oral     SpO2 98 %     Weight      Height      Head Circumference      Peak Flow      Pain Score      Pain Loc      Pain Edu?      Excl. in GC?    No data found.  Updated Vital Signs BP (!) 143/74 (BP Location: Right Arm)   Pulse (!) 57   Temp 97.9 F (36.6 C) (Oral)   Resp 15   SpO2 98%   Visual Acuity Right Eye Distance:   Left Eye Distance:   Bilateral Distance:    Right Eye Near:   Left Eye Near:    Bilateral Near:     Physical Exam Vitals and nursing note reviewed.  Constitutional:      General: She is not in acute distress.    Appearance: Normal appearance. She is normal weight. She is not ill-appearing or toxic-appearing.  Cardiovascular:     Rate and Rhythm: Regular rhythm.     Pulses: Normal pulses.     Heart sounds: Normal heart sounds. No murmur.  Pulmonary:     Effort: Pulmonary effort is normal. No respiratory distress.     Breath sounds: Normal breath sounds. No rhonchi.  Chest:     Chest wall: No tenderness.  Musculoskeletal:        General: Tenderness present. Normal range of motion.     Cervical back: Normal.     Thoracic back: Normal.     Lumbar back: Spasms and  tenderness present.     Comments: Back:  Patient ambulates from chair to exam table without difficulty.  Inspection: Skin clear and intact without obvious swelling, erythema, or ecchymosis. Warm to the touch  Palpation: Vertebral processes nontender. Tenderness around the  paravertebral muscles  ROM: FROM Strength: 5/5 hip flexion, 5/5 knee extension, 5/5 knee flexion, 5/5 plantar flexion, 5/5 dorsiflexion  DTR: Patellar tendon reflex intact   Neurological:     Mental Status: She is alert.     Cranial Nerves: Cranial nerves are intact.     Sensory: Sensation is intact.     Motor: Motor function is intact.     Coordination: Coordination is intact.     Deep Tendon Reflexes:     Reflex Scores:      Patellar reflexes are 2+ on the right side and 2+ on the left side.     UC Treatments / Results  Labs (all labs ordered are listed, but only abnormal results are displayed) Labs Reviewed - No data to display  EKG   Radiology No results found.  Procedures Procedures (including critical care time)  Medications Ordered in UC Medications  dexamethasone (DECADRON) injection 20 mg (has no administration in time range)    Initial Impression / Assessment and Plan / UC Course  I have reviewed the triage vital signs and the nursing notes.  Pertinent labs & imaging results that were available during my care of the patient were reviewed by me and considered in my medical decision  making (see chart for details).   Patient is stable for discharge.  Advised patient to take medication as prescribed.  To return for worsening of symptoms.  Patient verbalized understanding of the plan of care. Tylenol for pain Prednisone for inflammation Flexeril for muscle spasm Final Clinical Impressions(s) / UC Diagnoses   Final diagnoses:  Back pain without sciatica     Discharge Instructions     Rest, ice and heat as needed Ensure adequate ROM as tolerated. Prescribed ibuprofen as needed for  inflammation and pain relief Prescribed flexeril  for muscle spasm.  Do not drive or operate heavy machinery while taking this medication Return here or go to ER if you have any new or worsening symptoms such as numbness/tingling of the inner thighs, loss of bladder or bowel control, headache/blurry vision, nausea/vomiting, confusion/altered mental status, dizziness, weakness, passing out, imbalance, etc...      ED Prescriptions    Medication Sig Dispense Auth. Provider   acetaminophen (TYLENOL) 500 MG tablet Take 1 tablet (500 mg total) by mouth every 6 (six) hours as needed. 30 tablet Zala Degrasse, Zachery Dakins, FNP   cyclobenzaprine (FLEXERIL) 10 MG tablet Take 1 tablet (10 mg total) by mouth 2 (two) times daily as needed for muscle spasms. 20 tablet Miho Monda, Zachery Dakins, FNP   predniSONE (DELTASONE) 10 MG tablet Take 2 tablets (20 mg total) by mouth daily. 15 tablet Ercia Crisafulli, Zachery Dakins, FNP     PDMP not reviewed this encounter.   Durward Parcel, FNP 11/15/19 [id number]

## 2019-11-15 NOTE — ED Triage Notes (Signed)
Pt presents to UC w/ c/o worsening lower back pain. Pt has hx of chronic back pain but it worsened this morning. Denies injury.

## 2019-11-15 NOTE — Discharge Instructions (Signed)
Rest, ice and heat as needed Ensure adequate ROM as tolerated. Prescribed ibuprofen as needed for inflammation and pain relief Prescribed flexeril  for muscle spasm.  Do not drive or operate heavy machinery while taking this medication Return here or go to ER if you have any new or worsening symptoms such as numbness/tingling of the inner thighs, loss of bladder or bowel control, headache/blurry vision, nausea/vomiting, confusion/altered mental status, dizziness, weakness, passing out, imbalance, etc...   

## 2019-11-18 ENCOUNTER — Other Ambulatory Visit: Payer: Self-pay

## 2019-11-18 ENCOUNTER — Ambulatory Visit (INDEPENDENT_AMBULATORY_CARE_PROVIDER_SITE_OTHER): Payer: Medicare Other | Admitting: Family Medicine

## 2019-11-18 VITALS — BP 124/80 | HR 75 | Temp 98.0°F | Resp 15 | Ht 64.0 in | Wt 175.0 lb

## 2019-11-18 DIAGNOSIS — E669 Obesity, unspecified: Secondary | ICD-10-CM | POA: Diagnosis not present

## 2019-11-18 DIAGNOSIS — I10 Essential (primary) hypertension: Secondary | ICD-10-CM | POA: Diagnosis not present

## 2019-11-18 DIAGNOSIS — M545 Low back pain, unspecified: Secondary | ICD-10-CM

## 2019-11-18 MED ORDER — PREDNISONE 20 MG PO TABS: ORAL_TABLET | ORAL | 0 refills | Status: DC

## 2019-11-18 MED ORDER — TRAMADOL HCL 50 MG PO TABS: ORAL_TABLET | ORAL | 0 refills | Status: DC

## 2019-11-18 NOTE — Patient Instructions (Addendum)
F/u as before, call if you need me sooner   I have prescribed prednisone for you to take an additional dose  Tramadol is prescribed for use at bedtime ONLY if needed, script says up to 3/ day, bUT you take ONLY oNE at bedtime if needed.  No lifting wood in the future!  Thanks for choosing [facility], we consider it a privelige to serve you.

## 2019-11-18 NOTE — Assessment & Plan Note (Signed)
F/u urgent care ,  stll in pain, short course prednisone and tramadol

## 2019-11-21 ENCOUNTER — Ambulatory Visit (INDEPENDENT_AMBULATORY_CARE_PROVIDER_SITE_OTHER): Payer: Medicare Other | Admitting: Gastroenterology

## 2019-11-21 ENCOUNTER — Other Ambulatory Visit: Payer: Self-pay

## 2019-11-21 ENCOUNTER — Encounter: Payer: Self-pay | Admitting: Gastroenterology

## 2019-11-21 VITALS — BP 146/81 | HR 82 | Temp 97.0°F | Ht 64.0 in | Wt 175.8 lb

## 2019-11-21 DIAGNOSIS — K219 Gastro-esophageal reflux disease without esophagitis: Secondary | ICD-10-CM | POA: Diagnosis not present

## 2019-11-21 DIAGNOSIS — K5900 Constipation, unspecified: Secondary | ICD-10-CM

## 2019-11-21 NOTE — Patient Instructions (Signed)
Continue to take Linzess every day as you are doing.  Continue Protonix twice a day. You can decrease this to once daily if tolerated.   For gas, you can take gas-x or beano.   We will see you back in 1 year or sooner if needed!  I enjoyed seeing you again today! As you know, I value our relationship and want to provide genuine, compassionate, and quality care. I welcome your feedback. If you receive a survey regarding your visit,  I greatly appreciate you taking time to fill this out. See you next time!  Gelene Mink, PhD, ANP-BC [facility]

## 2019-11-21 NOTE — Progress Notes (Signed)
Referring Provider: Fayrene Helper, MD Primary Care Physician:  Fayrene Helper, MD Primary GI: Dr. Oneida Alar   Chief Complaint  Patient presents with  . Constipation    BM's every 2 1/2 days, no bloating, lots of gas  . Gastroesophageal Reflux    f/u. Doing good    HPI:   Laura [name] is a 69 y.o. female presenting today with a history of GERD and constipation, anemia, nausea, abdominal pain, with prior evaluation including capsule study, CT in Feb 2019 without evidence for chronic mesenteric ischemia, and normal GES on file.   Constipation: Currently taking Linzess 72 mcg daily. Taking probiotic daily. Was drinking muscadine juice, which helps with bowel movements. No bloating. Likes this dosage. Feels like she is having productive BMs. Has lots of gas.   Sample diet: had a roast, pinto and cabbage. Doesn't eat a whole lot of pinto and cabbage. Eating more salads.   GERD: Protonix BID. GERD controlled.   Labs recently done in Dec 2020: Hgb 11.7, Vit D normal at 78, LFTs normal.     Past Medical History:  Diagnosis Date  . Anxiety   . At low risk for fall 08/15/2018  . Cystitis   . Depression   . Gastritis   . GERD (gastroesophageal reflux disease)   . Hashimoto's thyroiditis    Hx   . Hyperlipidemia   . Hypertension   . Hypothyroidism   . Normocytic anemia 2009 Hb 9.9-11.1  . Osteopenia   . Shoulder pain   . Urinary incontinence     Past Surgical History:  Procedure Laterality Date  . Bilateral tubal ligation    . BREAST EXCISIONAL BIOPSY  2010   Excisional biopsy of benign left breast mass -lopoma   . BREAST SURGERY     left nreast biopsy  . COLONOSCOPY  08/26/2011   SLF: 1. Internal hemorrhoids  . COLONOSCOPY N/A 10/06/2015   KZL:DJTT fissure or internal hemorrhoids/mild sigmoid colitis. benign colonic path   . colonscopy  2005   Dr. Tamala Julian  . ESOPHAGOGASTRODUODENOSCOPY  12/2009   chronic gastritis  . ESOPHAGOGASTRODUODENOSCOPY N/A 05/17/2017   Dr. Oneida Alar; gastritis, normal small bowel biopsies, multiple gastric polyps. fundic gland polyps  . GIVENS CAPSULE STUDY N/A 05/30/2017   gastritis, no source for anemia identified  . left knee surgery Left March 1,2017  . PARTIAL KNEE ARTHROPLASTY Left 08/09/2017   Procedure: LEFT UNICOMPARTMENTAL KNEE;  Surgeon: Gaynelle Arabian, MD;  Location: [facility]S;  Service: Orthopedics;  Laterality: Left;  . Resection of left lobe of thyroid    . right carpal tunnel release    . rt. knee athroscopy  2004  . SAVORY DILATION N/A 05/17/2017   Procedure: SAVORY DILATION;  Surgeon: Danie Binder, MD;  Location: [facility] SUITE;  Service: Endoscopy;  Laterality: N/A;  . TOTAL ABDOMINAL HYSTERECTOMY  1994  . UMBILICAL HERNIA REPAIR    . Urethral dilation for stenosis  2009    Current Outpatient Medications  Medication Sig Dispense Refill  . acetaminophen (TYLENOL) 500 MG tablet Take 1 tablet (500 mg total) by mouth every 6 (six) hours as needed. 30 tablet 0  . amLODipine (NORVASC) 2.5 MG tablet TAKE 1 TABLET(2.5 MG) BY MOUTH DAILY 30 tablet 5  . atorvastatin (LIPITOR) 40 MG tablet Take 1 tablet (40 mg total) by mouth daily. 90 tablet 3  . citalopram (CELEXA) 20 MG tablet TAKE 1 TABLET BY MOUTH  DAILY 90 tablet 3  . cyclobenzaprine (FLEXERIL) 10  MG tablet Take 1 tablet (10 mg total) by mouth 2 (two) times daily as needed for muscle spasms. 20 tablet 0  . gabapentin (NEURONTIN) 100 MG capsule Take 1 capsule (100 mg total) by mouth at bedtime. 90 capsule 3  . hydrochlorothiazide (HYDRODIURIL) 25 MG tablet TAKE 1 TABLET BY MOUTH  DAILY 90 tablet 3  . hydrOXYzine (ATARAX/VISTARIL) 25 MG tablet Take 1 tablet (25 mg total) by mouth 3 (three) times daily as needed. 30 tablet 0  . levothyroxine (SYNTHROID, LEVOTHROID) 112 MCG tablet TAKE 1 TABLET(112 MCG) BY MOUTH DAILY BEFORE BREAKFAST 90 tablet 4  . linaclotide (LINZESS) 72 MCG capsule Take 1 capsule (72 mcg total) by mouth daily before breakfast. 90 capsule 3  .  loratadine (CLARITIN) 10 MG tablet Take 1 tablet (10 mg total) by mouth daily. (Patient taking differently: Take 10 mg by mouth daily as needed for allergies. ) 30 tablet 3  . pantoprazole (PROTONIX) 40 MG tablet TAKE 1 TABLET BY MOUTH  TWICE DAILY 180 tablet 3  . Polyethyl Glycol-Propyl Glycol (SYSTANE) 0.4-0.3 % SOLN Apply 1 drop to eye 3 (three) times daily as needed (for dry eyes.).    Marland Kitchen potassium chloride (K-DUR) 10 MEQ tablet Take 1 tablet (10 mEq total) by mouth 2 (two) times daily. 60 tablet 2  . predniSONE (DELTASONE) 20 MG tablet Take one tablet by mouth 3 times daily for 2 days, then take one tablet by mouth 2 times daily for 2 days, then take one tablet once daily for 2 days, then stop 12 tablet 0  . Probiotic Product (PROBIOTIC ADVANCED PO) Take 1 capsule by mouth daily.    . traMADol (ULTRAM) 50 MG tablet TAKE ONE TABLET BY MOUTH 3 TIMES DAILY AS NEEDED, FOR BACK PAIN 15 tablet 0  . triamcinolone cream (KENALOG) 0.1 % Apply twice daily to rash  On right leg for 5 days , then as needed 45 g 0   No current facility-administered medications for this visit.    Allergies as of 11/21/2019 - Review Complete 11/21/2019  Allergen Reaction Noted  . Cozaar [losartan potassium] Cough 07/04/2018  . Dilaudid [hydromorphone hcl] Other (See Comments) 03/01/2011  . Levaquin [levofloxacin] Other (See Comments) 07/28/2014  . Nsaids Other (See Comments) 01/13/2010  . Promethazine  05/17/2017    Family History  Problem Relation Age of Onset  . Pneumonia Mother   . Healthy Father   . Colon cancer Neg Hx   . Anesthesia problems Neg Hx   . Hypotension Neg Hx   . Malignant hyperthermia Neg Hx   . Pseudochol deficiency Neg Hx     Social History   Socioeconomic History  . Marital status: Married    Spouse name: Not on file  . Number of children: 3  . Years of education: Not on file  . Highest education level: Not on file  Occupational History  . 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 Smoking status: Former Smoker    Packs/day: 0.25    Years: 1.00    Pack years: 0.25    Types: Cigarettes    Quit date: 08/25/1972    Years since quitting: 47.2  . Smokeless tobacco: Never Used  Substance and Sexual Activity  . Alcohol use: No  . Drug use: No  . Sexual activity: Yes    Birth control/protection: Surgical    Comment: married  Other Topics Concern  . Not on file  Social History Narrative  . Not  on file   Social Determinants of Health   Financial Resource Strain:   . Difficulty of Paying Living Expenses: Not on file  Food Insecurity:   . Worried About Programme researcher, broadcasting/film/video in the Last Year: Not on file  . Ran Out of Food in the Last Year: Not on file  Transportation Needs:   . Lack of Transportation (Medical): Not on file  . Lack of Transportation (Non-Medical): Not on file  Physical Activity:   . Days of Exercise per Week: Not on file  . Minutes of Exercise per Session: Not on file  Stress:   . Feeling of Stress : Not on file  Social Connections:   . Frequency of Communication with Friends and Family: Not on file  . Frequency of Social Gatherings with Friends and Family: Not on file  . Attends Religious Services: Not on file  . Active Member of Clubs or Organizations: Not on file  . Attends Banker Meetings: Not on file  . Marital Status: Not on file    Review of Systems: Gen: Denies fever, chills, anorexia. Denies fatigue, weakness, weight loss.  CV: Denies chest pain, palpitations, syncope, peripheral edema, and claudication. Resp: Denies dyspnea at rest, cough, wheezing, coughing up blood, and pleurisy. GI:see HPI Derm: Denies rash, itching, dry skin Psych: Denies depression, anxiety, memory loss, confusion. No homicidal or suicidal ideation.  Heme: Denies bruising, bleeding, and enlarged lymph nodes.  Physical Exam: BP (!) 146/81   Pulse 82   Temp (!) 97 F (36.1 C) (Oral)   Ht 5\' 4"  (1.626 m)    Wt 175 lb 12.8 oz (79.7 kg)   BMI 30.18 kg/m  General:   Alert and oriented. No distress noted. Pleasant and cooperative.  Head:  Normocephalic and atraumatic. Eyes:  Conjuctiva clear without scleral icterus. Abdomen:  +BS, soft, non-tender and non-distended. No rebound or guarding. No HSM or masses noted. Msk:  Symmetrical without gross deformities. Normal posture. Extremities:  Without edema. Neurologic:  Alert and  oriented x4 Psych:  Alert and cooperative. Normal mood and affect.  ASSESSMENT: Laura [name] is a 69 y.o. female presenting today with history of GERD, constipation, anemia, doing well now with GERD and constipation well-managed.  GERD: continues with Protonix BID. Discussed reducing this to once daily if tolerated.   Constipation: continues with Linzess 72 mcg daily, and enjoys muscadine juice to help facilitate productive BMs. No alarm signs/symptoms.  Anemia: resolved.    PLAN:   Continue Protonix daily to BID  Linzess 72 mcg daily  Return in 1 year or sooner if needed  77, PhD, ANP-BC [facility]

## 2019-12-02 ENCOUNTER — Encounter: Payer: Self-pay | Admitting: Family Medicine

## 2019-12-02 DIAGNOSIS — M545 Low back pain, unspecified: Secondary | ICD-10-CM | POA: Insufficient documentation

## 2019-12-02 NOTE — Assessment & Plan Note (Signed)
Acute trauma resulting in symptoms. Short course of oral prednisone and bedtime tramadol only as needed. Pt ed re need to avoid movment which will provoke symptoms

## 2019-12-02 NOTE — Assessment & Plan Note (Signed)
  Patient re-educated about  the importance of commitment to a  minimum of 150 minutes of exercise per week as able.  The importance of healthy food choices with portion control discussed, as well as eating regularly and within a 12 hour window most days. The need to choose "clean , green" food 50 to 75% of the time is discussed, as well as to make water the primary drink and set a goal of 64 ounces water daily.    Weight /BMI 11/21/2019 11/18/2019 10/21/2019  WEIGHT 175 lb 12.8 oz 175 lb 175 lb  HEIGHT 5\' 4"  5\' 4"  5\' 4"   BMI 30.18 kg/m2 30.04 kg/m2 30.04 kg/m2

## 2019-12-02 NOTE — Progress Notes (Signed)
   Laura [name]     MRN: [id number]      DOB: 03-Oct-1951   HPI [name] is here with right lower lumbar pain, aggravated by movement , which was triggered after lifting wood . Denies radiation to leg, has been seen in urgent care recently and received IM injection, little relief. No new incontinence, weakness or numbness   ROS Denies recent fever or chills. Denies sinus pressure, nasal congestion, ear pain or sore throat. Denies chest congestion, productive cough or wheezing. Denies chest pains, palpitations and leg swelling Denies abdominal pain, nausea, vomiting,diarrhea or constipation.   Denies dysuria, frequency, hesitancy or incontinence.  Denies skin break down or rash.   PE  BP 124/80   Pulse 75   Temp 98 F (36.7 C) (Temporal)   Resp 15   Ht 5\' 4"  (1.626 m)   Wt 175 lb (79.4 kg)   SpO2 97%   BMI 30.04 kg/m   Patient alert and oriented and in no cardiopulmonary distress.Pt in pain  HEENT: No facial asymmetry, EOMI,     Neck supple .  Chest: Clear to auscultation bilaterally.  CVS: S1, S2 no murmurs, no S3.Regular rate.  ABD: Soft non tender.   Ext: No edema  MS: decreased  ROM lumbar  spine, adequate in shoulders, hips and knees.  Skin: Intact, no ulcerations or rash noted.  Psych: Good eye contact, normal affect. Memory intact not anxious or depressed appearing.  CNS: CN 2-12 intact, power,  normal throughout.no focal deficits noted.   Assessment & Plan  Back pain with radiculopathy F/u urgent care ,  stll in pain, short course prednisone and tramadol  Lumbar spine painful on movement Acute trauma resulting in symptoms. Short course of oral prednisone and bedtime tramadol only as needed. Pt ed re need to avoid movment which will provoke symptoms  Essential hypertension Controlled, no change in medication   Obesity (BMI 30.0-34.9)  Patient re-educated about  the importance of commitment to a  minimum of 150 minutes of exercise per week as  able.  The importance of healthy food choices with portion control discussed, as well as eating regularly and within a 12 hour window most days. The need to choose "clean , green" food 50 to 75% of the time is discussed, as well as to make water the primary drink and set a goal of 64 ounces water daily.    Weight /BMI 11/21/2019 11/18/2019 10/21/2019  WEIGHT 175 lb 12.8 oz 175 lb 175 lb  HEIGHT 5\' 4"  5\' 4"  5\' 4"   BMI 30.18 kg/m2 30.04 kg/m2 30.04 kg/m2

## 2019-12-02 NOTE — Assessment & Plan Note (Signed)
Controlled, no change in medication  

## 2019-12-15 ENCOUNTER — Other Ambulatory Visit: Payer: Self-pay

## 2019-12-15 ENCOUNTER — Ambulatory Visit: Payer: Medicare Other | Attending: Internal Medicine

## 2019-12-15 DIAGNOSIS — Z23 Encounter for immunization: Secondary | ICD-10-CM

## 2019-12-15 NOTE — Progress Notes (Signed)
   Covid-19 Vaccination Clinic  Name:  Laura [name]    MRN: [id number] DOB: Mar 21, 1951  12/15/2019  Laura [name] was observed post Covid-19 immunization for 15 minutes without incidence. She was provided with Vaccine Information Sheet and instruction to access the V-Safe system.   Laura [name] was instructed to call 911 with any severe reactions post vaccine: Marland Kitchen Difficulty breathing  . Swelling of your face and throat  . A fast heartbeat  . A bad rash all over your body  . Dizziness and weakness    Immunizations Administered    Name Date Dose VIS Date Route   Moderna COVID-19 Vaccine 12/15/2019  1:54 PM 0.5 mL 10/01/2019 Intramuscular   Manufacturer: Moderna   Lot: [id number]   NDC: 79980-012-39

## 2019-12-18 ENCOUNTER — Encounter: Payer: Self-pay | Admitting: Family Medicine

## 2019-12-18 ENCOUNTER — Other Ambulatory Visit: Payer: Self-pay

## 2019-12-18 ENCOUNTER — Ambulatory Visit (INDEPENDENT_AMBULATORY_CARE_PROVIDER_SITE_OTHER): Payer: Medicare Other | Admitting: Family Medicine

## 2019-12-18 VITALS — BP 126/78 | HR 82 | Temp 96.6°F | Resp 15 | Ht 64.0 in | Wt 177.0 lb

## 2019-12-18 DIAGNOSIS — M5410 Radiculopathy, site unspecified: Secondary | ICD-10-CM | POA: Diagnosis not present

## 2019-12-18 MED ORDER — PREDNISONE 10 MG (21) PO TBPK: ORAL_TABLET | ORAL | 0 refills | Status: DC

## 2019-12-18 MED ORDER — KETOROLAC TROMETHAMINE 60 MG/2ML IM SOLN
30.0000 mg | Freq: Once | INTRAMUSCULAR | Status: AC
  Administered 2019-12-18: 30 mg via INTRAMUSCULAR

## 2019-12-18 MED ORDER — METHYLPREDNISOLONE ACETATE 80 MG/ML IJ SUSP
80.0000 mg | Freq: Once | INTRAMUSCULAR | Status: AC
  Administered 2019-12-18: 80 mg via INTRAMUSCULAR

## 2019-12-18 NOTE — Assessment & Plan Note (Signed)
Still has ongoing sciatic pain provided with Toradol and Depo-Medrol injections here in the office.  And a short course of prednisone Dosepak.  If these do not work would look at considering a x-ray and referral to Ortho to make sure there is nothing that were missing.  At this time she does not want to do physical therapy as this was offered to her as well.  Gave her extensive information regarding exercises she can do to help prevent sciatic irritation. Reviewed side effects, risks and benefits of medication.   Patient acknowledged agreement and understanding of the plan.

## 2019-12-18 NOTE — Patient Instructions (Addendum)
Happy New Year! May you have a year filled with hope, love, happiness and laughter.  I appreciate the opportunity to provide you with care for your health and wellness. Today we discussed: sciatica   Follow up: 02/19/2020  No labs or referrals today  Pain injections today  Low dose prednisone dose pack   Please continue to practice social distancing to keep you, your family, and our community safe.  If you must go out, please wear a mask and practice good handwashing.  It was a pleasure to see you and I look forward to continuing to work together on your health and well-being. Please do not hesitate to call the office if you need care or have questions about your care.  Have a wonderful day and week. With Gratitude, Tereasa Coop, DNP, AGNP-BC   Sciatica Rehab Ask your health care provider which exercises are safe for you. Do exercises exactly as told by your health care provider and adjust them as directed. It is normal to feel mild stretching, pulling, tightness, or discomfort as you do these exercises. Stop right away if you feel sudden pain or your pain gets worse. Do not begin these exercises until told by your health care provider. Stretching and range-of-motion exercises These exercises warm up your muscles and joints and improve the movement and flexibility of your hips and back. These exercises also help to relieve pain, numbness, and tingling. Sciatic nerve glide 1. Sit in a chair with your head facing down toward your chest. Place your hands behind your back. Let your shoulders slump forward. 2. Slowly straighten one of your legs while you tilt your head back as if you are looking toward the ceiling. Only straighten your leg as far as you can without making your symptoms worse. 3. Hold this position for _10-15_________ seconds. 4. Slowly return to the starting position. 5. Repeat with your other leg. Repeat __2________ times. Complete this exercise ____3______ times a  day. Knee to chest with hip adduction and internal rotation  1. Lie on your back on a firm surface with both legs straight. 2. Bend one of your knees and move it up toward your chest until you feel a gentle stretch in your lower back and buttock. Then, move your knee toward the shoulder that is on the opposite side from your leg. This is hip adduction and internal rotation. ? Hold your leg in this position by holding on to the front of your knee. 3. Hold this position for ____10-15______ seconds. 4. Slowly return to the starting position. 5. Repeat with your other leg. Repeat ___2_______ times. Complete this exercise ____3______ times a day. Prone extension on elbows  1. Lie on your abdomen on a firm surface. A bed may be too soft for this exercise. 2. Prop yourself up on your elbows. 3. Use your arms to help lift your chest up until you feel a gentle stretch in your abdomen and your lower back. ? This will place some of your body weight on your elbows. If this is uncomfortable, try stacking pillows under your chest. ? Your hips should stay down, against the surface that you are lying on. Keep your hip and back muscles relaxed. 4. Hold this position for __10-15________ seconds. 5. Slowly relax your upper body and return to the starting position. Repeat __2________ times. Complete this exercise __3________ times a day. Strengthening exercises These exercises build strength and endurance in your back. Endurance is the ability to use your muscles for a long  time, even after they get tired. Pelvic tilt This exercise strengthens the muscles that lie deep in the abdomen. 1. Lie on your back on a firm surface. Bend your knees and keep your feet flat on the floor. 2. Tense your abdominal muscles. Tip your pelvis up toward the ceiling and flatten your lower back into the floor. ? To help with this exercise, you may place a small towel under your lower back and try to push your back into the  towel. 3. Hold this position for ____10-15______ seconds. 4. Let your muscles relax completely before you repeat this exercise. Repeat ___2_______ times. Complete this exercise ____3______ times a day. Alternating arm and leg raises  1. Get on your hands and knees on a firm surface. If you are on a hard floor, you may want to use padding, such as an exercise mat, to cushion your knees. 2. Line up your arms and legs. Your hands should be directly below your shoulders, and your knees should be directly below your hips. 3. Lift your left leg behind you. At the same time, raise your right arm and straighten it in front of you. ? Do not lift your leg higher than your hip. ? Do not lift your arm higher than your shoulder. ? Keep your abdominal and back muscles tight. ? Keep your hips facing the ground. ? Do not arch your back. ? Keep your balance carefully, and do not hold your breath. 4. Hold this position for __10-15________ seconds. 5. Slowly return to the starting position. 6. Repeat with your right leg and your left arm. Repeat ____2______ times. Complete this exercise ____3______ times a day. Posture and body mechanics Good posture and healthy body mechanics can help to relieve stress in your body's tissues and joints. Body mechanics refers to the movements and positions of your body while you do your daily activities. Posture is part of body mechanics. Good posture means:  Your spine is in its natural S-curve position (neutral).  Your shoulders are pulled back slightly.  Your head is not tipped forward. Follow these guidelines to improve your posture and body mechanics in your everyday activities. Standing   When standing, keep your spine neutral and your feet about hip width apart. Keep a slight bend in your knees. Your ears, shoulders, and hips should line up.  When you do a task in which you stand in one place for a long time, place one foot up on a stable object that is 2-4  inches (5-10 cm) high, such as a footstool. This helps keep your spine neutral. Sitting   When sitting, keep your spine neutral and keep your feet flat on the floor. Use a footrest, if necessary, and keep your thighs parallel to the floor. Avoid rounding your shoulders, and avoid tilting your head forward.  When working at a desk or a computer, keep your desk at a height where your hands are slightly lower than your elbows. Slide your chair under your desk so you are close enough to maintain good posture.  When working at a computer, place your monitor at a height where you are looking straight ahead and you do not have to tilt your head forward or downward to look at the screen. Resting  When lying down and resting, avoid positions that are most painful for you.  If you have pain with activities such as sitting, bending, stooping, or squatting, lie in a position in which your body does not bend very much. For  example, avoid curling up on your side with your arms and knees near your chest (fetal position).  If you have pain with activities such as standing for a long time or reaching with your arms, lie with your spine in a neutral position and bend your knees slightly. Try the following positions: ? Lying on your side with a pillow between your knees. ? Lying on your back with a pillow under your knees. Lifting   When lifting objects, keep your feet at least shoulder width apart and tighten your abdominal muscles.  Bend your knees and hips and keep your spine neutral. It is important to lift using the strength of your legs, not your back. Do not lock your knees straight out.  Always ask for help to lift heavy or awkward objects. This information is not intended to replace advice given to you by your health care provider. Make sure you discuss any questions you have with your health care provider. Document Revised: 02/08/2019 Document Reviewed: 11/08/2018 Elsevier Patient Education  [place].

## 2019-12-18 NOTE — Progress Notes (Signed)
Subjective:  Patient ID: Laura [name], female    DOB: Feb 25, 1951  Age: 69 y.o. MRN: [id number]  CC:  Chief Complaint  Patient presents with  . Back Pain    O-3 wks L-lower back D-3 weeks C-throbbing and travels to thigh A-sitting R-laying down T-constant S-8/10      HPI  HPI Laura [name] is a 69 year old female patient who presents today with flareup of sciatica.  Been going off and on for the last 3 weeks.  Reports it is in her lower midline back to the left.  Reports that radiates into the left buttock and thigh describes as a throbbing ache.  Its aggravated with sitting.  She reports that she feels best when she is either upright for short periods of time or laying down.  She reports today it is a constant discomfort with an 8 out of 10 here in the office.  Is receptive to getting injections today, reports she does not want to do physical therapy at this time.  But is open to having some stretches provided to her.  Today patient denies signs and symptoms of COVID 19 infection including fever, chills, cough, shortness of breath, and headache. Past Medical, Surgical, Social History, Allergies, and Medications have been Reviewed.   Past Medical History:  Diagnosis Date  . Anxiety   . At low risk for fall 08/15/2018  . Cystitis   . Depression   . Gastritis   . GERD (gastroesophageal reflux disease)   . Hashimoto's thyroiditis    Hx   . Hyperlipidemia   . Hypertension   . Hypothyroidism   . Normocytic anemia 2009 Hb 9.9-11.1  . Osteopenia   . Shoulder pain   . Urinary incontinence     Current Meds  Medication Sig  . acetaminophen (TYLENOL) 500 MG tablet Take 1 tablet (500 mg total) by mouth every 6 (six) hours as needed.  Marland Kitchen amLODipine (NORVASC) 2.5 MG tablet TAKE 1 TABLET(2.5 MG) BY MOUTH DAILY  . atorvastatin (LIPITOR) 40 MG tablet Take 1 tablet (40 mg total) by mouth daily.  . citalopram (CELEXA) 20 MG tablet TAKE 1 TABLET BY MOUTH  DAILY  . cyclobenzaprine  (FLEXERIL) 10 MG tablet Take 1 tablet (10 mg total) by mouth 2 (two) times daily as needed for muscle spasms.  Marland Kitchen gabapentin (NEURONTIN) 100 MG capsule Take 1 capsule (100 mg total) by mouth at bedtime.  . hydrochlorothiazide (HYDRODIURIL) 25 MG tablet TAKE 1 TABLET BY MOUTH  DAILY  . hydrOXYzine (ATARAX/VISTARIL) 25 MG tablet Take 1 tablet (25 mg total) by mouth 3 (three) times daily as needed.  Marland Kitchen levothyroxine (SYNTHROID, LEVOTHROID) 112 MCG tablet TAKE 1 TABLET(112 MCG) BY MOUTH DAILY BEFORE BREAKFAST  . linaclotide (LINZESS) 72 MCG capsule Take 1 capsule (72 mcg total) by mouth daily before breakfast.  . loratadine (CLARITIN) 10 MG tablet Take 1 tablet (10 mg total) by mouth daily. (Patient taking differently: Take 10 mg by mouth daily as needed for allergies. )  . pantoprazole (PROTONIX) 40 MG tablet TAKE 1 TABLET BY MOUTH  TWICE DAILY  . Polyethyl Glycol-Propyl Glycol (SYSTANE) 0.4-0.3 % SOLN Apply 1 drop to eye 3 (three) times daily as needed (for dry eyes.).  Marland Kitchen potassium chloride (K-DUR) 10 MEQ tablet Take 1 tablet (10 mEq total) by mouth 2 (two) times daily.  . Probiotic Product (PROBIOTIC ADVANCED PO) Take 1 capsule by mouth daily.  . traMADol (ULTRAM) 50 MG tablet TAKE ONE TABLET BY MOUTH 3 TIMES  DAILY AS NEEDED, FOR BACK PAIN  . triamcinolone cream (KENALOG) 0.1 % Apply twice daily to rash  On right leg for 5 days , then as needed  . [DISCONTINUED] predniSONE (DELTASONE) 20 MG tablet Take one tablet by mouth 3 times daily for 2 days, then take one tablet by mouth 2 times daily for 2 days, then take one tablet once daily for 2 days, then stop    ROS:  Review of Systems  Constitutional: Negative.   HENT: Negative.   Eyes: Negative.   Respiratory: Negative.   Cardiovascular: Negative.   Gastrointestinal: Negative.   Genitourinary: Negative.   Musculoskeletal: Positive for back pain.  Skin: Negative.   Neurological: Negative.   Endo/Heme/Allergies: Negative.     Psychiatric/Behavioral: Negative.   All other systems reviewed and are negative.    Objective:   Today's Vitals: BP 126/78   Pulse 82   Temp (!) 96.6 F (35.9 C) (Temporal)   Resp 15   Ht 5\' 4"  (1.626 m)   Wt 177 lb 0.6 oz (80.3 kg)   SpO2 96%   BMI 30.39 kg/m  Vitals with BMI 12/18/2019 11/21/2019 11/18/2019  Height 5\' 4"  5\' 4"  5\' 4"   Weight 177 lbs 1 oz 175 lbs 13 oz 175 lbs  BMI 30.37 30.16 30.02  Systolic 126 146 11/20/2019  Diastolic 78 81 80  Pulse 82 82 75     Physical Exam Vitals and nursing note reviewed.  Constitutional:      Appearance: Normal appearance. She is obese.  HENT:     Head: Normocephalic and atraumatic.     Right Ear: External ear normal.     Left Ear: External ear normal.     Nose: Nose normal.     Mouth/Throat:     Pharynx: Oropharynx is clear.  Eyes:     General:        Right eye: No discharge.        Left eye: No discharge.     Conjunctiva/sclera: Conjunctivae normal.  Cardiovascular:     Rate and Rhythm: Normal rate and regular rhythm.     Pulses: Normal pulses.     Heart sounds: Normal heart sounds.  Pulmonary:     Effort: Pulmonary effort is normal.     Breath sounds: Normal breath sounds.  Musculoskeletal:     Cervical back: Normal, normal range of motion and neck supple.     Thoracic back: Normal.     Lumbar back: Spasms and tenderness present. No swelling, edema, deformity, signs of trauma, lacerations or bony tenderness. Decreased range of motion. Negative right straight leg raise test and negative left straight leg raise test. No scoliosis.  Skin:    General: Skin is warm.  Neurological:     General: No focal deficit present.     Mental Status: She is alert and oriented to person, place, and time.  Psychiatric:        Mood and Affect: Mood normal.        Behavior: Behavior normal.        Thought Content: Thought content normal.        Judgment: Judgment normal.     Assessment   1. Back pain with radiculopathy     Tests  ordered No orders of the defined types were placed in this encounter.    Plan: Please see assessment and plan per problem list above.   Meds ordered this encounter  Medications  . ketorolac (TORADOL) injection 30 mg  .  methylPREDNISolone acetate (DEPO-MEDROL) injection 80 mg  . predniSONE (STERAPRED UNI-PAK 21 TAB) 10 MG (21) TBPK tablet    Sig: Take as directed    Dispense:  21 tablet    Refill:  0    Order Specific Question:   Supervising Provider    Answer:   Fayrene Helper [[id number]]    Patient to follow-up in as scheduled .  Perlie Mayo, NP

## 2019-12-31 ENCOUNTER — Other Ambulatory Visit: Payer: Self-pay

## 2019-12-31 DIAGNOSIS — E890 Postprocedural hypothyroidism: Secondary | ICD-10-CM

## 2019-12-31 LAB — TSH: TSH: 3.31 mIU/L (ref 0.40–4.50)

## 2019-12-31 LAB — T4, FREE: Free T4: 1.5 ng/dL (ref 0.8–1.8)

## 2020-01-04 ENCOUNTER — Ambulatory Visit
Admission: EM | Admit: 2020-01-04 | Discharge: 2020-01-04 | Disposition: A | Discharge status: Home / Self Care | Payer: Medicare Other | Attending: Emergency Medicine | Admitting: Emergency Medicine

## 2020-01-04 ENCOUNTER — Other Ambulatory Visit: Payer: Self-pay

## 2020-01-04 DIAGNOSIS — M5442 Lumbago with sciatica, left side: Secondary | ICD-10-CM

## 2020-01-04 MED ORDER — PREDNISONE 10 MG (21) PO TBPK: ORAL_TABLET | Freq: Every day | ORAL | 0 refills | Status: DC

## 2020-01-04 MED ORDER — DEXAMETHASONE SODIUM PHOSPHATE 10 MG/ML IJ SOLN
10.0000 mg | Freq: Once | INTRAMUSCULAR | Status: AC
  Administered 2020-01-04: 14:00:00 10 mg via INTRAMUSCULAR

## 2020-01-04 MED ORDER — TIZANIDINE HCL 2 MG PO CAPS: 2.0000 mg | ORAL_CAPSULE | Freq: Three times a day (TID) | ORAL | 0 refills | Status: DC

## 2020-01-04 NOTE — ED Provider Notes (Signed)
[facility]   [id number] 01/04/20 Arrival Time: 1312  CC: Back pain  SUBJECTIVE: History from: patient. Laura [name] is a 69 y.o. female complains of left low back pain that began 2 weeks ago, worsened this morning.  Denies a precipitating event or specific injury.  Localizes the pain to the LT low back and radiates into LT leg.  Describes the pain as constant and sharp in character.  Has tried OTC medications without relief.  Symptoms are made worse with ROM.  Reports similar symptoms in the past treated with steroid with relief.  Denies fever, chills, erythema, ecchymosis, effusion, weakness, numbness and tingling, saddle paresthesias, loss of bowel or bladder function.      ROS: As per HPI.  All other pertinent ROS negative.     Past Medical History:  Diagnosis Date  . Anxiety   . At low risk for fall 08/15/2018  . Cystitis   . Depression   . Gastritis   . GERD (gastroesophageal reflux disease)   . Hashimoto's thyroiditis    Hx   . Hyperlipidemia   . Hypertension   . Hypothyroidism   . Normocytic anemia 2009 Hb 9.9-11.1  . Osteopenia   . Shoulder pain   . Urinary incontinence    Past Surgical History:  Procedure Laterality Date  . Bilateral tubal ligation    . BREAST EXCISIONAL BIOPSY  2010   Excisional biopsy of benign left breast mass -lopoma   . BREAST SURGERY     left nreast biopsy  . COLONOSCOPY  08/26/2011   SLF: 1. Internal hemorrhoids  . COLONOSCOPY N/A 10/06/2015   [facility]:LFYB fissure or internal hemorrhoids/mild sigmoid colitis. benign colonic path   . colonscopy  2005   Dr. Katrinka Blazing  . ESOPHAGOGASTRODUODENOSCOPY  12/2009   chronic gastritis  . ESOPHAGOGASTRODUODENOSCOPY N/A 05/17/2017   Dr. Darrick Penna; gastritis, normal small bowel biopsies, multiple gastric polyps. fundic gland polyps  . GIVENS CAPSULE STUDY N/A 05/30/2017   gastritis, no source for anemia identified  . left knee surgery Left March 1,2017  . PARTIAL KNEE ARTHROPLASTY Left 08/09/2017   Procedure: LEFT UNICOMPARTMENTAL KNEE;  Surgeon: Ollen Gross, MD;  Location: [facility];  Service: Orthopedics;  Laterality: Left;  . Resection of left lobe of thyroid    . right carpal tunnel release    . rt. knee athroscopy  2004  . SAVORY DILATION N/A 05/17/2017   Procedure: SAVORY DILATION;  Surgeon: West Bali, MD;  Location: [facility];  Service: Endoscopy;  Laterality: N/A;  . TOTAL ABDOMINAL HYSTERECTOMY  1994  . UMBILICAL HERNIA REPAIR    . Urethral dilation for stenosis  2009   Allergies  Allergen Reactions  . Cozaar [Losartan Potassium] Cough  . Dilaudid [Hydromorphone Hcl] Other (See Comments)    Confusion;altered state of mind  . Levaquin [Levofloxacin] Other (See Comments)    Dry throat, no raste  . Nsaids Other (See Comments)    REACTION: per GI pt no longer to use NSAIDS REACTION: per GI pt no longer to use NSAIDS  . Promethazine     Legs itching, RESTLESS LEGS   No current facility-administered medications on file prior to encounter.   Current Outpatient Medications on File Prior to Encounter  Medication Sig Dispense Refill  . acetaminophen (TYLENOL) 500 MG tablet Take 1 tablet (500 mg total) by mouth every 6 (six) hours as needed. 30 tablet 0  . amLODipine (NORVASC) 2.5 MG tablet TAKE 1 TABLET(2.5 MG) BY MOUTH DAILY 30 tablet 5  .  atorvastatin (LIPITOR) 40 MG tablet Take 1 tablet (40 mg total) by mouth daily. 90 tablet 3  . citalopram (CELEXA) 20 MG tablet TAKE 1 TABLET BY MOUTH  DAILY 90 tablet 3  . gabapentin (NEURONTIN) 100 MG capsule Take 1 capsule (100 mg total) by mouth at bedtime. 90 capsule 3  . hydrochlorothiazide (HYDRODIURIL) 25 MG tablet TAKE 1 TABLET BY MOUTH  DAILY 90 tablet 3  . hydrOXYzine (ATARAX/VISTARIL) 25 MG tablet Take 1 tablet (25 mg total) by mouth 3 (three) times daily as needed. 30 tablet 0  . levothyroxine (SYNTHROID, LEVOTHROID) 112 MCG tablet TAKE 1 TABLET(112 MCG) BY MOUTH DAILY BEFORE BREAKFAST 90 tablet 4  . linaclotide  (LINZESS) 72 MCG capsule Take 1 capsule (72 mcg total) by mouth daily before breakfast. 90 capsule 3  . loratadine (CLARITIN) 10 MG tablet Take 1 tablet (10 mg total) by mouth daily. (Patient taking differently: Take 10 mg by mouth daily as needed for allergies. ) 30 tablet 3  . pantoprazole (PROTONIX) 40 MG tablet TAKE 1 TABLET BY MOUTH  TWICE DAILY 180 tablet 3  . Polyethyl Glycol-Propyl Glycol (SYSTANE) 0.4-0.3 % SOLN Apply 1 drop to eye 3 (three) times daily as needed (for dry eyes.).    Marland Kitchen potassium chloride (K-DUR) 10 MEQ tablet Take 1 tablet (10 mEq total) by mouth 2 (two) times daily. 60 tablet 2  . Probiotic Product (PROBIOTIC ADVANCED PO) Take 1 capsule by mouth daily.    . traMADol (ULTRAM) 50 MG tablet TAKE ONE TABLET BY MOUTH 3 TIMES DAILY AS NEEDED, FOR BACK PAIN 15 tablet 0  . triamcinolone cream (KENALOG) 0.1 % Apply twice daily to rash  On right leg for 5 days , then as needed 45 g 0   Social History   Socioeconomic History  . Marital status: Married    Spouse name: Not on file  . Number of children: 3  . Years of education: Not on file  . Highest education level: Not on file  Occupational History  . Occupation: Production manager producing automobile parts   Tobacco Use  . Smoking status: Former Smoker    Packs/day: 0.25    Years: 1.00    Pack years: 0.25    Types: Cigarettes    Quit date: 08/25/1972    Years since quitting: 47.3  . Smokeless tobacco: Never Used  Substance and Sexual Activity  . Alcohol use: No  . Drug use: No  . Sexual activity: Yes    Birth control/protection: Surgical    Comment: married  Other Topics Concern  . Not on file  Social History Narrative  . Not on file   Social Determinants of Health   Financial Resource Strain:   . Difficulty of Paying Living Expenses: Not on file  Food Insecurity:   . Worried About Charity fundraiser in the Last Year: Not on file  . Ran Out of Food in the Last Year: Not on file  Transportation Needs:   .  Lack of Transportation (Medical): Not on file  . Lack of Transportation (Non-Medical): Not on file  Physical Activity:   . Days of Exercise per Week: Not on file  . Minutes of Exercise per Session: Not on file  Stress:   . Feeling of Stress : Not on file  Social Connections:   . Frequency of Communication with Friends and Family: Not on file  . Frequency of Social Gatherings with Friends and Family: Not on file  . Attends Religious Services:  Not on file  . Active Member of Clubs or Organizations: Not on file  . Attends Banker Meetings: Not on file  . Marital Status: Not on file  Intimate Partner Violence:   . Fear of Current or Ex-Partner: Not on file  . Emotionally Abused: Not on file  . Physically Abused: Not on file  . Sexually Abused: Not on file   Family History  Problem Relation Age of Onset  . Pneumonia Mother   . Healthy Father   . Colon cancer Neg Hx   . Anesthesia problems Neg Hx   . Hypotension Neg Hx   . Malignant hyperthermia Neg Hx   . Pseudochol deficiency Neg Hx     OBJECTIVE:  Vitals:   01/04/20 1333  BP: (!) 142/80  Pulse: 78  Resp: 17  Temp: 98.2 F (36.8 C)  TempSrc: Oral  SpO2: 96%    General appearance: ALERT; in no acute distress.  Head: NCAT Lungs: Normal respiratory effort; CTAB CV: RRR Musculoskeletal: Back Inspection: Skin warm, dry, clear and intact without obvious erythema, effusion, or ecchymosis.  Palpation: TTP over LT low back and paravertebral muscles ROM: LROM about the back Strength: 5/5 shld abduction, 5/5 shld adduction, 5/5 elbow flexion, 5/5 elbow extension, 5/5 grip strength, 5/5 hip flexion, 5/5 knee abduction, 5/5 knee adduction, 5/5 knee flexion, 5/5 knee extension, 5/5 dorsiflexion, 5/5 plantar flexion Skin: warm and dry Neurologic: Ambulates without difficulty; Sensation intact about the upper/ lower extremities Psychological: alert and cooperative; normal mood and affect  ASSESSMENT & PLAN:  1.  Acute left-sided low back pain with left-sided sciatica     Meds ordered this encounter  Medications  . predniSONE (STERAPRED UNI-PAK 21 TAB) 10 MG (21) TBPK tablet    Sig: Take by mouth daily. Take 6 tabs by mouth daily  for 2 days, then 5 tabs for 2 days, then 4 tabs for 2 days, then 3 tabs for 2 days, 2 tabs for 2 days, then 1 tab by mouth daily for 2 days    Dispense:  42 tablet    Refill:  0    Order Specific Question:   Supervising Provider    Answer:   Eustace Moore [[id number]]  . tizanidine (ZANAFLEX) 2 MG capsule    Sig: Take 1 capsule (2 mg total) by mouth 3 (three) times daily.    Dispense:  15 capsule    Refill:  0    Order Specific Question:   Supervising Provider    Answer:   Eustace Moore [[id number]]  . dexamethasone (DECADRON) injection 10 mg    Continue conservative management of rest, ice, heat, massage, and gentle stretches Steroid shot given in office Prednisone prescribed.  Take as directed and to completion Take zanaflex at nighttime for symptomatic relief. Avoid driving or operating heavy machinery while using medication. Follow up with PCP or back specialist for recheck in 1-2 weeks Return or go to the ER if you have any new or worsening symptoms (fever, chills, chest pain, abdominal pain, changes in bowel or bladder habits, pain radiating into lower legs, etc...)   Reviewed expectations re: course of current medical issues. Questions answered. Outlined signs and symptoms indicating need for more acute intervention. Patient verbalized understanding. After Visit Summary given.    Rennis Harding, PA-C 01/04/20 1348

## 2020-01-04 NOTE — ED Triage Notes (Signed)
Pt seen by provider prior to triage, see provider note  

## 2020-01-04 NOTE — Discharge Instructions (Signed)
Continue conservative management of rest, ice, heat, massage, and gentle stretches Steroid shot given in office Prednisone prescribed.  Take as directed and to completion Take zanaflex at nighttime for symptomatic relief. Avoid driving or operating heavy machinery while using medication. Follow up with PCP or back specialist for recheck in 1-2 weeks Return or go to the ER if you have any new or worsening symptoms (fever, chills, chest pain, abdominal pain, changes in bowel or bladder habits, pain radiating into lower legs, etc...)

## 2020-01-09 ENCOUNTER — Ambulatory Visit (INDEPENDENT_AMBULATORY_CARE_PROVIDER_SITE_OTHER): Payer: Medicare Other | Admitting: Family Medicine

## 2020-01-09 ENCOUNTER — Ambulatory Visit (INDEPENDENT_AMBULATORY_CARE_PROVIDER_SITE_OTHER): Payer: Medicare Other | Admitting: "Endocrinology

## 2020-01-09 ENCOUNTER — Encounter: Payer: Self-pay | Admitting: Family Medicine

## 2020-01-09 ENCOUNTER — Other Ambulatory Visit: Payer: Self-pay

## 2020-01-09 ENCOUNTER — Encounter: Payer: Self-pay | Admitting: "Endocrinology

## 2020-01-09 ENCOUNTER — Ambulatory Visit (HOSPITAL_COMMUNITY)
Admission: RE | Admit: 2020-01-09 | Discharge: 2020-01-09 | Disposition: A | Discharge status: Home / Self Care | Payer: Medicare Other | Source: Ambulatory Visit | Attending: Family Medicine | Admitting: Family Medicine

## 2020-01-09 VITALS — BP 140/84 | HR 66 | Temp 97.2°F | Resp 15 | Ht 64.0 in | Wt 182.0 lb

## 2020-01-09 VITALS — BP 131/80 | HR 67 | Ht 64.0 in | Wt 183.2 lb

## 2020-01-09 DIAGNOSIS — M5410 Radiculopathy, site unspecified: Secondary | ICD-10-CM | POA: Diagnosis not present

## 2020-01-09 DIAGNOSIS — M545 Low back pain: Secondary | ICD-10-CM | POA: Diagnosis not present

## 2020-01-09 DIAGNOSIS — E890 Postprocedural hypothyroidism: Secondary | ICD-10-CM | POA: Diagnosis not present

## 2020-01-09 IMAGING — DX DG LUMBAR SPINE COMPLETE 4+V
5 series · 5 of 5 positions shown · non-contrast
Comparison: None.

CLINICAL DATA: Lower LEFT back pain

EXAM:
LUMBAR SPINE - COMPLETE 4+ VIEW

[l-spine ap]
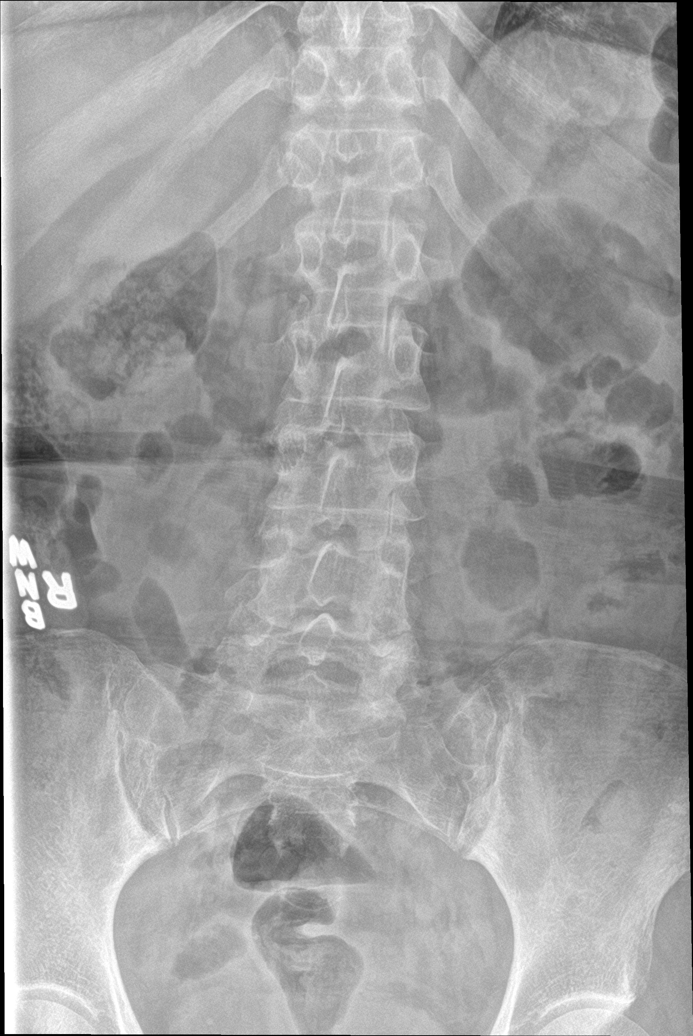

[l-spine obl (1 of 2)]
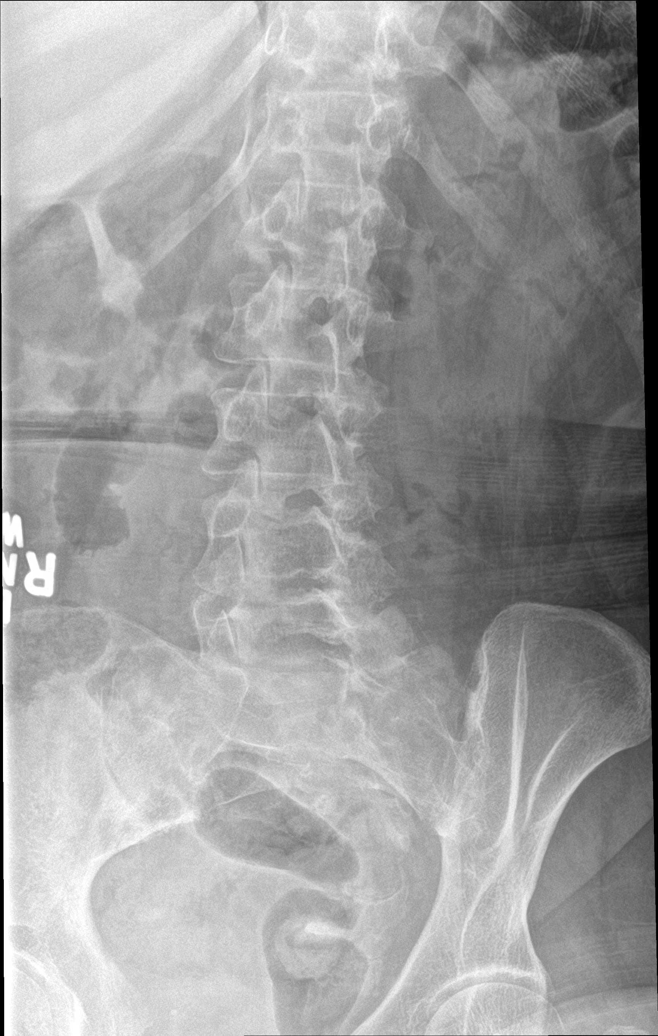

[l-spine obl (2 of 2)]
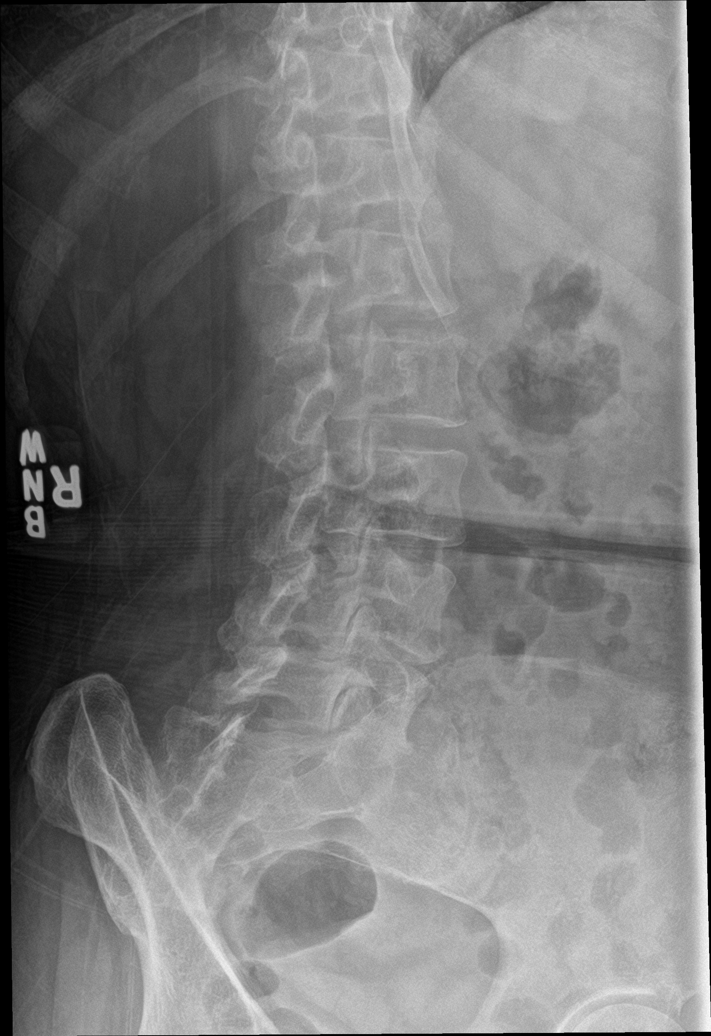

[l-spine lat]
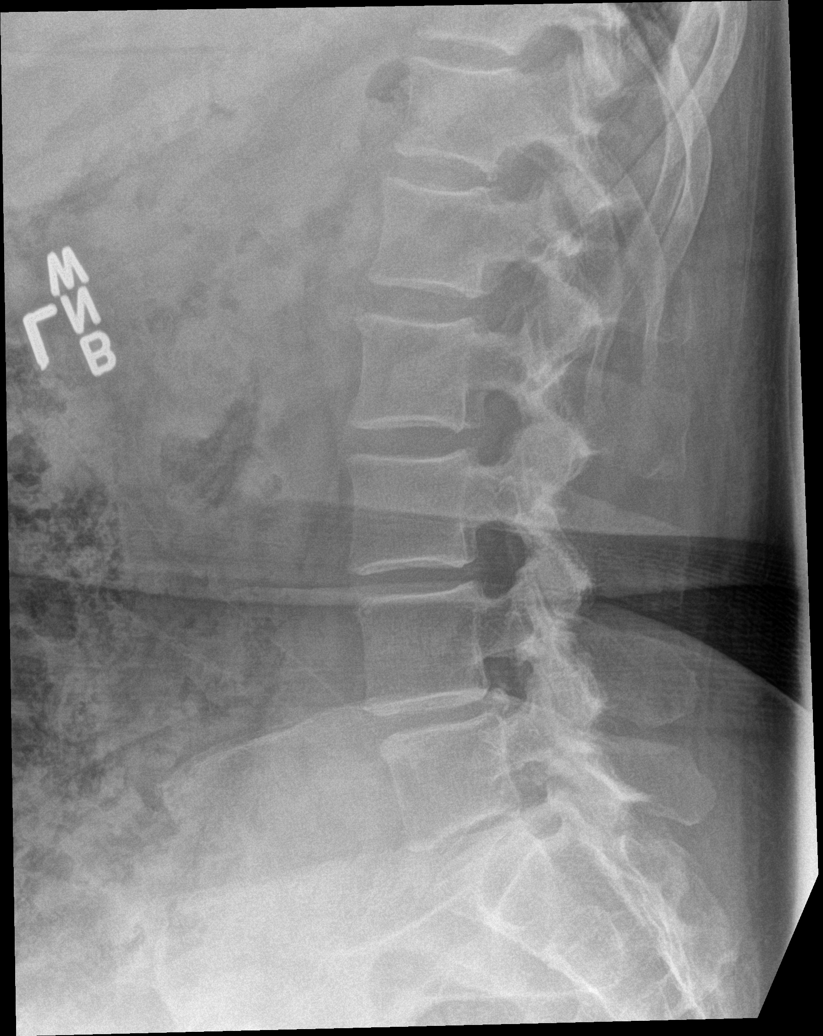

[l-spine spot]
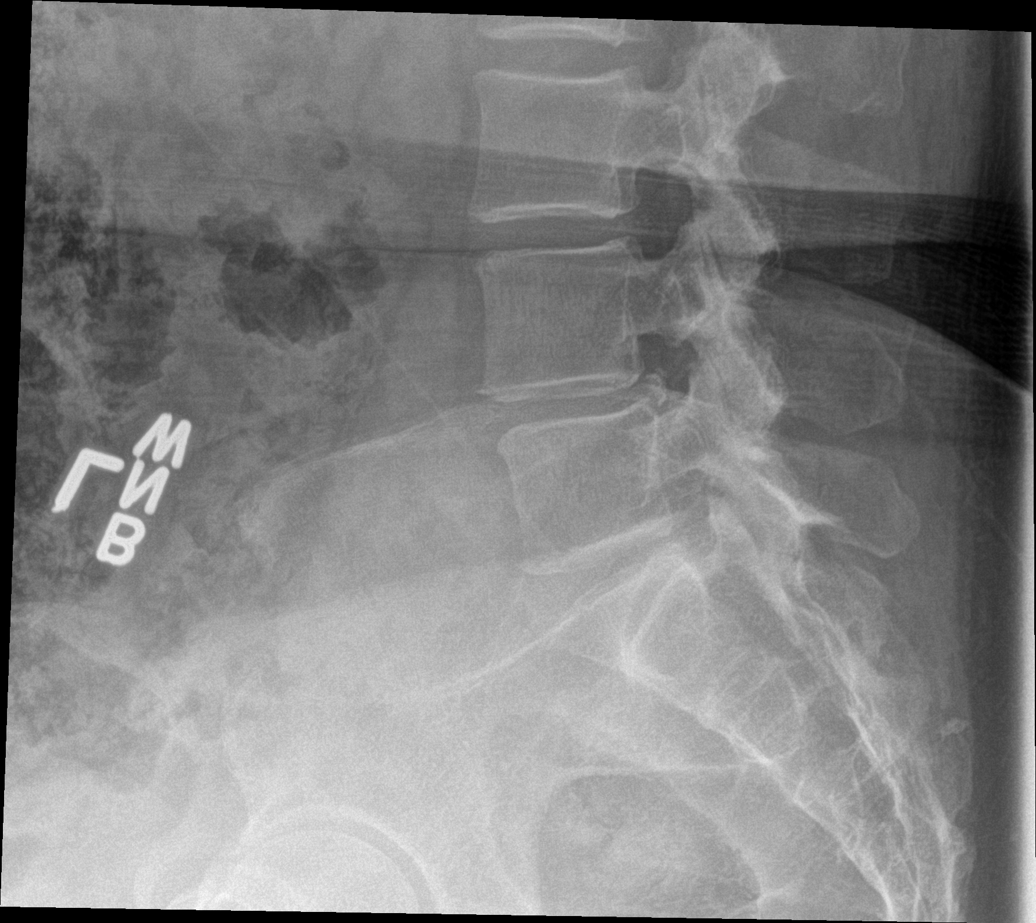

[5 of 5 positions shown; findings below may reference images not displayed]

FINDINGS: Normal alignment of lumbar vertebral bodies. No loss of vertebral
body height or disc height. No pars fracture. No subluxation.
IMPRESSION: No acute or chronic findings lumbar spine.

## 2020-01-09 MED ORDER — KETOROLAC TROMETHAMINE 60 MG/2ML IM SOLN
30.0000 mg | Freq: Once | INTRAMUSCULAR | Status: AC
  Administered 2020-01-09: 30 mg via INTRAMUSCULAR

## 2020-01-09 NOTE — Assessment & Plan Note (Signed)
INability to sit for longer than a few minutes. Tx from UC not helping. Provided with Toradol injection here in office. And xrays ordered since pain has increased PT and Referral to Ortho will be next steps

## 2020-01-09 NOTE — Progress Notes (Signed)
01/09/2020    Endocrinology follow-up note   Complaint: Follow-up for hypothyroidism  HPI  Laura [name] is a 69 y.o.-year-old female,  She has  medical history of goiter status post left hemithyroidectomy 20+ years ago, unremarkable ultrasound in 2012.     She is currently on levothyroxine  112 mcg p.o. nightly.  She is compliant to her medications.  She has a steady body weight.   -  She denies any heat/cold intolerance, palpitations, tremors.    Pt denies feeling nodules in neck, hoarseness, dysphagia/odynophagia, SOB with lying down.  No history of radiation exposure to her head and neck.   No recent use of iodine supplements.   ROS: Constitutional: + steady weight , no fatigue, no subjective hyperthermia nor hypothermia.   Eyes: no blurry vision, no xerophthalmia ENT: no sore throat, no nodules palpated in throat, no dysphagia/odynophagia, no hoarseness Cardiovascular: No chest pain, no shortness of breath, no palpitations.    Skin: no rashes Neurological: No tremors, no numbness, no tingling.   Psychiatric: no depression/anxiety  PE: BP 131/80   Pulse 67   Ht 5\' 4"  (1.626 m)   Wt 183 lb 3.2 oz (83.1 kg)   BMI 31.45 kg/m  Wt Readings from Last 3 Encounters:  01/09/20 182 lb (82.6 kg)  01/09/20 183 lb 3.2 oz (83.1 kg)  12/18/19 177 lb 0.6 oz (80.3 kg)    Physical Exam- Limited  Constitutional:  Body mass index is 31.45 kg/m. , not in acute distress, normal state of mind Eyes:  EOMI, no exophthalmos Neck: Supple Thyroid: No gross goiter Respiratory: Adequate breathing efforts Musculoskeletal: no gross deformities, strength intact in all four extremities, no gross restriction of joint movements Skin:  no rashes, no hyperemia Neurological: no tremor with outstretched hands,    Recent Results (from the past 2160 hour(s))  Lipid panel     Status: Abnormal   Collection Time: 10/15/19 12:44 PM  Result Value Ref Range   Cholesterol 214 (H) <200 mg/dL   HDL  47 (L) > OR = 50 mg/dL   Triglycerides 169 (H) <150 mg/dL   LDL Cholesterol (Calc) 136 (H) mg/dL (calc)    Comment: Reference range: <100 . Desirable range <100 mg/dL for primary prevention;   <70 mg/dL for patients with CHD or diabetic patients  with > or = 2 CHD risk factors. Marland Kitchen LDL-C is now calculated using the Martin-Hopkins  calculation, which is a validated novel method providing  better accuracy than the Friedewald equation in the  estimation of LDL-C.  Cresenciano Genre et al. Annamaria Helling. 7124;580(99): 2061-2068  (http://education.[facility].com/faq/FAQ164)    Total CHOL/HDL Ratio 4.6 <5.0 (calc)   Non-HDL Cholesterol (Calc) 167 (H) <130 mg/dL (calc)    Comment: For patients with diabetes plus 1 major ASCVD risk  factor, treating to a non-HDL-C goal of <100 mg/dL  (LDL-C of <70 mg/dL) is considered a therapeutic  option.   COMPLETE METABOLIC PANEL WITH GFR     Status: Abnormal   Collection Time: 10/15/19 12:44 PM  Result Value Ref Range   Glucose, Bld 90 65 - 99 mg/dL    Comment: .            Fasting reference interval .    BUN 13 7 - 25 mg/dL   Creat 1.00 (H) 0.50 - 0.99 mg/dL    Comment: For patients >24 years of age, the reference limit for Creatinine is approximately 13% higher for people identified as African-American. .    GFR, Est  Non African American 57 (L) > OR = 60 mL/min/1.54m2   GFR, Est African American 67 > OR = 60 mL/min/1.67m2   BUN/Creatinine Ratio 13 6 - 22 (calc)   Sodium 139 135 - 146 mmol/L   Potassium 3.6 3.5 - 5.3 mmol/L   Chloride 99 98 - 110 mmol/L   CO2 30 20 - 32 mmol/L   Calcium 10.1 8.6 - 10.4 mg/dL   Total Protein 7.5 6.1 - 8.1 g/dL   Albumin 4.2 3.6 - 5.1 g/dL   Globulin 3.3 1.9 - 3.7 g/dL (calc)   AG Ratio 1.3 1.0 - 2.5 (calc)   Total Bilirubin 0.6 0.2 - 1.2 mg/dL   Alkaline phosphatase (APISO) 64 37 - 153 U/L   AST 18 10 - 35 U/L   ALT 16 6 - 29 U/L  VITAMIN D 25 Hydroxy (Vit-D Deficiency, Fractures)     Status: None   Collection  Time: 10/15/19 12:44 PM  Result Value Ref Range   Vit D, 25-Hydroxy 78 30 - 100 ng/mL    Comment: Vitamin D Status         25-OH Vitamin D: . Deficiency:                    <20 ng/mL Insufficiency:             20 - 29 ng/mL Optimal:                 > or = 30 ng/mL . For 25-OH Vitamin D testing on patients on  D2-supplementation and patients for whom quantitation  of D2 and D3 fractions is required, the [facility](TM) 25-OH VIT D, (D2,D3), LC/MS/MS is recommended: order  code 62130 (patients >28yrs). See Note 1 . Note 1 . For additional information, please refer to  http://education.[facility].com/faq/FAQ199  (This link is being provided for informational/ educational purposes only.)   CBC     Status: Abnormal   Collection Time: 10/15/19 12:44 PM  Result Value Ref Range   WBC 4.8 3.8 - 10.8 Thousand/uL   RBC 4.24 3.80 - 5.10 Million/uL   Hemoglobin 11.7 11.7 - 15.5 g/dL   HCT 86.5 78.4 - 69.6 %   MCV 87.0 80.0 - 100.0 fL   MCH 27.6 27.0 - 33.0 pg   MCHC 31.7 (L) 32.0 - 36.0 g/dL   RDW 29.5 28.4 - 13.2 %   Platelets 274 140 - 400 Thousand/uL   MPV 10.1 7.5 - 12.5 fL  T4, Free     Status: None   Collection Time: 12/31/19  9:33 AM  Result Value Ref Range   Free T4 1.5 0.8 - 1.8 ng/dL  TSH     Status: None   Collection Time: 12/31/19  9:33 AM  Result Value Ref Range   TSH 3.31 0.40 - 4.50 mIU/L   ASSESSMENT: 1. Hypothyroidism, postsurgical  PLAN:  -Her previsit thyroid function tests are consistent with appropriate replacement.    -She is advised to continue LT4 112 mcg p.o. every morning.    - We discussed about the correct intake of her thyroid hormone, on empty stomach at fasting, with water, separated by at least 30 minutes from breakfast and other medications,  and separated by more than 4 hours from calcium, iron, multivitamins, acid reflux medications (PPIs). -Patient is made aware of the fact that thyroid hormone replacement is needed for life, dose to  be adjusted by periodic monitoring of thyroid function tests.   -Her screening bone density from  October 2019 was normal.   She is advised to maintain close follow up with her PMD for Primary care needs.    - Time spent on this patient care encounter:  20 minutes of which 50% was spent in  counseling and the rest reviewing  her current and  previous labs / studies and medications  doses and developing a plan for long term care. [name]  participated in the discussions, expressed understanding, and voiced agreement with the above plans.  All questions were answered to her satisfaction. she is encouraged to contact clinic should she have any questions or concerns prior to her return visit.   Marquis Lunch, MD Phone: [phone]  Fax: [phone]   01/09/2020, 10:27 PM

## 2020-01-09 NOTE — Patient Instructions (Signed)
I appreciate the opportunity to provide you with care for your health and wellness. Today we discussed: back pain-increased   Follow up: 02/19/2020 as scheduled  No labs  Pain injection today, X-rays at [facility], PT ordered  Please continue to practice social distancing to keep you, your family, and our community safe.  If you must go out, please wear a mask and practice good handwashing.  It was a pleasure to see you and I look forward to continuing to work together on your health and well-being. Please do not hesitate to call the office if you need care or have questions about your care.  Have a wonderful day and week. With Gratitude, Tereasa Coop, DNP, AGNP-BC

## 2020-01-09 NOTE — Progress Notes (Signed)
Subjective:  Patient ID: [name], female    DOB: 1950/11/29  Age: 69 y.o. MRN: [id number]  CC:  Chief Complaint  Patient presents with  . Back Pain    started sat and went to UC and got meds and shot. Left side of back and shooting down left leg      HPI  HPI   BAYLIE DRAKES is a 69 y.o. female complains of left low back pain that began several weeks, she reported to UC on Saturday am due to it getting worse. They gave her a dose pack of prednisone 10mg , tizanidine. She reports today these are not helping.    Denies a precipitating event or specific injury.  Localizes the pain to the LT low back and radiates into LT leg.  Describes the pain as constant and sharp in character.  Has tried OTC medications without relief.  Symptoms are made worse with ROM.  Reports similar symptoms in the past treated with steroid with relief.  Denies fever, chills, erythema, ecchymosis, effusion, weakness, numbness and tingling, saddle paresthesias, loss of bowel or bladder function.    Today the pain is 10/10 and she is unable to sit. She is walking around the examination room due to discomfort.  ROS: As per HPI.  All other pertinent ROS negative.   Today patient denies signs and symptoms of COVID 19 infection including fever, chills, cough, shortness of breath, and headache. Past Medical, Surgical, Social History, Allergies, and Medications have been Reviewed.   Past Medical History:  Diagnosis Date  . Anxiety   . At low risk for fall 08/15/2018  . Cystitis   . Depression   . Gastritis   . GERD (gastroesophageal reflux disease)   . Hashimoto's thyroiditis    Hx   . Hyperlipidemia   . Hypertension   . Hypothyroidism   . Normocytic anemia 2009 Hb 9.9-11.1  . Osteopenia   . Shoulder pain   . Urinary incontinence     Current Meds  Medication Sig  . acetaminophen (TYLENOL) 500 MG tablet Take 1 tablet (500 mg total) by mouth every 6 (six) hours as needed.  04-12-1991 amLODipine (NORVASC)  2.5 MG tablet TAKE 1 TABLET(2.5 MG) BY MOUTH DAILY  . atorvastatin (LIPITOR) 40 MG tablet Take 1 tablet (40 mg total) by mouth daily.  . citalopram (CELEXA) 20 MG tablet TAKE 1 TABLET BY MOUTH  DAILY  . gabapentin (NEURONTIN) 100 MG capsule Take 1 capsule (100 mg total) by mouth at bedtime.  . hydrochlorothiazide (HYDRODIURIL) 25 MG tablet TAKE 1 TABLET BY MOUTH  DAILY  . hydrOXYzine (ATARAX/VISTARIL) 25 MG tablet Take 1 tablet (25 mg total) by mouth 3 (three) times daily as needed.  Marland Kitchen levothyroxine (SYNTHROID, LEVOTHROID) 112 MCG tablet TAKE 1 TABLET(112 MCG) BY MOUTH DAILY BEFORE BREAKFAST  . linaclotide (LINZESS) 72 MCG capsule Take 1 capsule (72 mcg total) by mouth daily before breakfast.  . loratadine (CLARITIN) 10 MG tablet Take 1 tablet (10 mg total) by mouth daily. (Patient taking differently: Take 10 mg by mouth daily as needed for allergies. )  . pantoprazole (PROTONIX) 40 MG tablet TAKE 1 TABLET BY MOUTH  TWICE DAILY  . Polyethyl Glycol-Propyl Glycol (SYSTANE) 0.4-0.3 % SOLN Apply 1 drop to eye 3 (three) times daily as needed (for dry eyes.).  Marland Kitchen potassium chloride (K-DUR) 10 MEQ tablet Take 1 tablet (10 mEq total) by mouth 2 (two) times daily.  . Probiotic Product (PROBIOTIC ADVANCED PO) Take 1  capsule by mouth daily.  . traMADol (ULTRAM) 50 MG tablet TAKE ONE TABLET BY MOUTH 3 TIMES DAILY AS NEEDED, FOR BACK PAIN  . triamcinolone cream (KENALOG) 0.1 % Apply twice daily to rash  On right leg for 5 days , then as needed    ROS:  Review of Systems  All other systems reviewed and are negative. see HPI   Objective:   Today's Vitals: BP 140/84   Pulse 66   Temp (!) 97.2 F (36.2 C) (Temporal)   Resp 15   Ht 5\' 4"  (1.626 m)   Wt 182 lb (82.6 kg)   SpO2 98%   BMI 31.24 kg/m  Vitals with BMI 01/09/2020 01/09/2020 01/04/2020  Height 5\' 4"  5\' 4"  -  Weight 182 lbs 183 lbs 3 oz -  BMI 53.64 68.03 -  Systolic 212 248 250  Diastolic 84 80 80  Pulse 66 67 78     Physical  Exam Vitals and nursing note reviewed.  Constitutional:      Appearance: Normal appearance. She is obese.  HENT:     Head: Normocephalic and atraumatic.     Right Ear: External ear normal.     Left Ear: External ear normal.     Mouth/Throat:     Comments: Mask in place  Eyes:     General:        Right eye: No discharge.        Left eye: No discharge.     Conjunctiva/sclera: Conjunctivae normal.  Cardiovascular:     Rate and Rhythm: Normal rate and regular rhythm.     Pulses: Normal pulses.     Heart sounds: Normal heart sounds.  Pulmonary:     Effort: Pulmonary effort is normal.     Breath sounds: Normal breath sounds.  Musculoskeletal:     Cervical back: Normal range of motion and neck supple.     Lumbar back: Spasms and tenderness present. Decreased range of motion.  Skin:    General: Skin is warm.  Neurological:     General: No focal deficit present.     Mental Status: She is alert and oriented to person, place, and time.  Psychiatric:        Mood and Affect: Mood normal.        Behavior: Behavior normal.        Thought Content: Thought content normal.        Judgment: Judgment normal.      Assessment   1. Back pain with radiculopathy     Tests ordered Orders Placed This Encounter  Procedures  . DG Lumbar Spine Complete  . Ambulatory referral to Physical Therapy     Plan: Please see assessment and plan per problem list above.   Meds ordered this encounter  Medications  . ketorolac (TORADOL) injection 30 mg    Patient to follow-up in as scheduled  Perlie Mayo, NP

## 2020-01-12 ENCOUNTER — Ambulatory Visit: Payer: Medicare Other | Attending: Internal Medicine

## 2020-01-12 DIAGNOSIS — Z23 Encounter for immunization: Secondary | ICD-10-CM

## 2020-01-12 NOTE — Progress Notes (Signed)
   Covid-19 Vaccination Clinic  Name:  Laura [name]    MRN: [id number] DOB: April 24, 1951  01/12/2020  [name] was observed post Covid-19 immunization for 15 minutes without incident. She was provided with Vaccine Information Sheet and instruction to access the V-Safe system.   [name] was instructed to call 911 with any severe reactions post vaccine: Marland Kitchen Difficulty breathing  . Swelling of face and throat  . A fast heartbeat  . A bad rash all over body  . Dizziness and weakness   Immunizations Administered    Name Date Dose VIS Date Route   Moderna COVID-19 Vaccine 01/12/2020  1:18 PM 0.5 mL 10/01/2019 Intramuscular   Manufacturer: Moderna   Lot: [id number]   NDC: 53646-803-21

## 2020-01-14 ENCOUNTER — Encounter (HOSPITAL_COMMUNITY): Payer: Self-pay | Admitting: Physical Therapy

## 2020-01-14 ENCOUNTER — Other Ambulatory Visit: Payer: Self-pay

## 2020-01-14 ENCOUNTER — Ambulatory Visit (HOSPITAL_COMMUNITY): Payer: Medicare Other | Attending: Family Medicine | Admitting: Physical Therapy

## 2020-01-14 DIAGNOSIS — R293 Abnormal posture: Secondary | ICD-10-CM | POA: Insufficient documentation

## 2020-01-14 DIAGNOSIS — M5416 Radiculopathy, lumbar region: Secondary | ICD-10-CM | POA: Diagnosis not present

## 2020-01-14 NOTE — Therapy (Signed)
[facility] [address] [place], [place], [address] Phone: [phone]   Fax:  [phone]  Physical Therapy Evaluation  Patient Details  Name: Laura [name] MRN: [id number] Date of Birth: 17-Jan-1951 Referring Provider (PT): Tereasa Coop NP    Encounter Date: 01/14/2020  PT End of Session - 01/14/20 0853    Visit Number  1    Number of Visits  8    Date for PT Re-Evaluation  02/14/20    Authorization Type  [employer] Medicare, no visit limit, no auth req    Authorization Time Period  01/14/20-02/14/20    Authorization - Visit Number  1    Authorization - Number of Visits  10    Progress Note Due on Visit  8    PT Start Time  0810    PT Stop Time  0855    PT Time Calculation (min)  45 min    Activity Tolerance  Patient tolerated treatment well    Behavior During Therapy  [facility] for tasks assessed/performed       Past Medical History:  Diagnosis Date  . Anxiety   . At low risk for fall 08/15/2018  . Cystitis   . Depression   . Gastritis   . GERD (gastroesophageal reflux disease)   . Hashimoto's thyroiditis    Hx   . Hyperlipidemia   . Hypertension   . Hypothyroidism   . Normocytic anemia 2009 Hb 9.9-11.1  . Osteopenia   . Shoulder pain   . Urinary incontinence     Past Surgical History:  Procedure Laterality Date  . Bilateral tubal ligation    . BREAST EXCISIONAL BIOPSY  2010   Excisional biopsy of benign left breast mass -lopoma   . BREAST SURGERY     left nreast biopsy  . COLONOSCOPY  08/26/2011   SLF: 1. Internal hemorrhoids  . COLONOSCOPY N/A 10/06/2015   YYT:KPTW fissure or internal hemorrhoids/mild sigmoid colitis. benign colonic path   . colonscopy  2005   Dr. Katrinka Blazing  . ESOPHAGOGASTRODUODENOSCOPY  12/2009   chronic gastritis  . ESOPHAGOGASTRODUODENOSCOPY N/A 05/17/2017   Dr. Darrick Penna; gastritis, normal small bowel biopsies, multiple gastric polyps. fundic gland polyps  . GIVENS CAPSULE STUDY N/A 05/30/2017   gastritis,  no source for anemia identified  . left knee surgery Left March 1,2017  . PARTIAL KNEE ARTHROPLASTY Left 08/09/2017   Procedure: LEFT UNICOMPARTMENTAL KNEE;  Surgeon: Ollen Gross, MD;  Location: [facility];  Service: Orthopedics;  Laterality: Left;  . Resection of left lobe of thyroid    . right carpal tunnel release    . rt. knee athroscopy  2004  . SAVORY DILATION N/A 05/17/2017   Procedure: SAVORY DILATION;  Surgeon: West Bali, MD;  Location: [facility];  Service: Endoscopy;  Laterality: N/A;  . TOTAL ABDOMINAL HYSTERECTOMY  1994  . UMBILICAL HERNIA REPAIR    . Urethral dilation for stenosis  2009    There were no vitals filed for this visit.   Subjective Assessment - 01/14/20 0817    Subjective  Patient presents to physical therapy with complaint of LT side lumbar radiculopathy. Patient says pain began insidiously about 3 weeks ago. Patient went to ortho and had xrays which she says did not show anything remarkable. Patient says she had steroid injection which she says was helpful for pain. Patient says pain is less, but yesterday had some pain in her left knee which was bothering her to put weight on.  Patient says was prescribed tramadol and prednisone.    Limitations  Sitting;Lifting;Standing;Walking;House hold activities    How long can you sit comfortably?  15-20 minutes    How long can you stand comfortably?  30 minutes    How long can you walk comfortably?  no problem, pain is better when moving    Diagnostic tests  xrays    Patient Stated Goals  stop pain in my back    Currently in Pain?  Yes    Pain Score  6     Pain Location  Back    Pain Orientation  Left;Posterior    Pain Descriptors / Indicators  Radiating;Sharp;Shooting;Stabbing    Pain Radiating Towards  LT knee    Pain Onset  1 to 4 weeks ago    Pain Frequency  Constant    Aggravating Factors   standing, prolonged sitting, washing dishes    Pain Relieving Factors  walking, laying down    Effect of Pain on  Daily Activities  Limited         [facility] PT Assessment - 01/14/20 0001      Assessment   Medical Diagnosis  Lumbar radiculopathy LT side     Referring Provider (PT)  Tereasa Coop NP     Onset Date/Surgical Date  --   12/23/19   Next MD Visit  --   None scheduled    Prior Therapy  For LT knee 20 years ago       Precautions   Precautions  None      Restrictions   Weight Bearing Restrictions  No      Balance Screen   Has the patient fallen in the past 6 months  No      Home Environment   Living Environment  Private residence    Living Arrangements  Spouse/significant other      Prior Function   Level of Independence  Independent      Cognition   Overall Cognitive Status  Within Functional Limits for tasks assessed      Observation/Other Assessments   Observations  noted RT hip elevation in standing    Focus on Therapeutic Outcomes (FOTO)   47% limited       Sensation   Light Touch  Appears Intact      Posture/Postural Control   Posture/Postural Control  Postural limitations    Postural Limitations  Increased lumbar lordosis      ROM / Strength   AROM / PROM / Strength  AROM;Strength      AROM   Overall AROM Comments  Bilateral hip and knee AROM WFL, except min restriciton in LT knee flexion     AROM Assessment Site  Lumbar    Lumbar Flexion  50% restriciton     Lumbar Extension  25% restriction     Lumbar - Right Side Bend  WNL    Lumbar - Left Side Bend  25% restriction    with pain    Lumbar - Right Rotation  WNL    Lumbar - Left Rotation  WNL      Strength   Strength Assessment Site  Hip;Knee;Ankle    Right/Left Hip  Right;Left    Right Hip Flexion  5/5    Right Hip Extension  4+/5    Right Hip ABduction  4+/5    Left Hip Flexion  4+/5    Left Hip Extension  4/5    Left Hip ABduction  4+/5    Right/Left  Knee  Right;Left    Right Knee Flexion  5/5    Right Knee Extension  5/5    Left Knee Flexion  4+/5    Left Knee Extension  5/5    Right/Left  Ankle  Right;Left    Right Ankle Dorsiflexion  5/5    Left Ankle Dorsiflexion  5/5      Palpation   Palpation comment  Mod tenderness to palpation at bilateral lumbar paraspinals level L3-5      Special Tests   Other special tests  --   LT SLR (+); slump test (-) bilateral     Ambulation/Gait   Ambulation/Gait  Yes    Ambulation/Gait Assistance  7: Independent    Gait Pattern  Decreased step length - left;Trendelenburg;Antalgic    Ambulation Surface  Level;Indoor                Objective measurements completed on examination: See above findings.      [facility] PT Treatment/Exercise - 01/14/20 0001      Exercises   Exercises  Lumbar      Lumbar Exercises: Stretches   Single Knee to Chest Stretch  Left;5 reps   with towel under thigh     Lumbar Exercises: Supine   Ab Set  5 reps;5 seconds    Pelvic Tilt  5 reps;5 seconds             PT Education - 01/14/20 0820    Education Details  On evaluation findings, POC and HEP    Person(s) Educated  Patient    Methods  Explanation;Handout    Comprehension  Verbalized understanding       PT Short Term Goals - 01/14/20 0908      PT SHORT TERM GOAL #1   Title  Patient will be independent with initial HEP to improve functional outcomes    Time  2    Period  Weeks    Status  New    Target Date  01/31/20        PT Long Term Goals - 01/14/20 0908      PT LONG TERM GOAL #1   Title  Patient will improve FOTO score to <40% to indicate improvement in functional outcomes    Time  4    Period  Weeks    Status  New    Target Date  02/14/20      PT LONG TERM GOAL #2   Title  Patient will report at least 75% overall improvement in subjective complaint to indicate improvement in ability to perform ADLs.    Time  4    Period  Weeks    Status  New    Target Date  02/14/20      PT LONG TERM GOAL #3   Title  Patient will have equal to or > 4+/5 MMT throughout BLE to improve ability to perform functional  mobility, stair ambulation and ADLs.    Time  4    Period  Weeks    Status  New    Target Date  02/14/20      PT LONG TERM GOAL #4   Title  Pateint will be able to stand >1 hour with no increased pain to indicate improved functional outcomes and ability to wash dishes and cook dinner    Time  4    Period  Weeks    Status  New    Target Date  02/14/20  Plan - 01/14/20 0859    Clinical Impression Statement  Patient is a 69 y.o. female who presents to physical therapy with complaint of LT side lumbar radiculopathy. Patient demonstrates decreased strength, ROM restriction, postural deficits and gait abnormalities which are likely contributing to symptoms of pain and are negatively impacting patient ability to perform ADLs and functional mobility tasks. Patient will benefit from skilled physical therapy services to address these deficits to reduce pain, improve level of function with ADLs, and functional mobility tasks.    Examination-Activity Limitations  Bend;Squat;Sleep;Sit;Stairs;Caring for Others;Carry;Stand;Dressing;Transfers;Locomotion Level;Lift    Examination-Participation Restrictions  Community Activity;Yard Work    Stability/Clinical Decision Making  Stable/Uncomplicated    Optometrist  Low    Rehab Potential  Good    PT Frequency  2x / week    PT Duration  4 weeks    PT Treatment/Interventions  ADLs/Self Care Home Management;Aquatic Therapy;Biofeedback;Cryotherapy;Electrical Stimulation;Iontophoresis 4mg /ml Dexamethasone;Moist Heat;Traction;Balance training;Therapeutic exercise;Manual techniques;Vestibular;Therapeutic activities;Taping;Functional mobility training;Splinting;Vasopneumatic Device;Energy conservation;Joint Manipulations;Spinal Manipulations;Passive range of motion;Dry needling;Patient/family education;Orthotic Fit/Training;Stair training;Gait training;DME Instruction;Ultrasound;Neuromuscular re-education;Scar mobilization    PT Next Visit  Plan  Review goals and HEP. Progress core and hip strenghening as tolerated, assess response to flexion based exercise. Add glute set/ bridge, heel slides, clamshell. Manuals as indicated    PT Home Exercise Plan  01/14/20: ab set, pelvic tilts, SKTC with towel    Consulted and Agree with Plan of Care  Patient       Patient will benefit from skilled therapeutic intervention in order to improve the following deficits and impairments:  Abnormal gait, Hypomobility, Decreased activity tolerance, Decreased strength, Pain, Decreased balance, Decreased mobility, Difficulty walking, Decreased range of motion, Improper body mechanics, Impaired flexibility, Postural dysfunction  Visit Diagnosis: Radiculopathy, lumbar region  Abnormal posture     Problem List Patient Active Problem List   Diagnosis Date Noted  . Lumbar spine painful on movement 12/02/2019  . Bursitis of right shoulder 10/21/2019  . Obesity (BMI 30.0-34.9) 06/17/2019  . OA (osteoarthritis) of knee 08/09/2017  . Constipation 02/05/2016  . Allergic rhinitis 10/08/2014  . GAD (generalized anxiety disorder) 09/23/2014  . Back pain with radiculopathy 04/14/2014  . POSTSURGICAL HYPOTHYROIDISM 06/22/2010  . Normocytic anemia 08/04/2009  . GERD 04/20/2009  . Hyperlipidemia LDL goal <100 05/16/2008  . Essential hypertension 05/16/2008  . OSTEOPENIA 05/16/2008  . Urinary incontinence 05/16/2008   9:14 AM, 01/14/20 01/16/20 PT DPT  Physical Therapist with [facility]  [facility]  [phone]   [facility] [address] [place], [place], [place] Phone: [phone]   Fax:  [phone]  Name: Laura [name] MRN: [name] Date of Birth: July 20, 1951

## 2020-01-14 NOTE — Patient Instructions (Signed)
Access Code: [facility] URL: [email or web address] Date: 01/14/2020 Prepared by: Georges Lynch  Exercises Supine Transversus Abdominis Bracing - Hands on Stomach - 2 x daily - 7 x weekly - 2 sets - 10 reps - 5 hold Supine Posterior Pelvic Tilt - 2 x daily - 7 x weekly - 2 sets - 10 reps - 5 hold Hook Lying Single Knee to Chest Stretch with Towel - 2 x daily - 7 x weekly - 2 sets - 10 reps - 5 hold

## 2020-01-16 ENCOUNTER — Other Ambulatory Visit: Payer: Self-pay

## 2020-01-16 ENCOUNTER — Ambulatory Visit: Payer: Medicare Other | Admitting: Family Medicine

## 2020-01-16 ENCOUNTER — Ambulatory Visit (HOSPITAL_COMMUNITY): Payer: Medicare Other | Admitting: Physical Therapy

## 2020-01-16 ENCOUNTER — Encounter (HOSPITAL_COMMUNITY): Payer: Self-pay | Admitting: Physical Therapy

## 2020-01-16 DIAGNOSIS — M5416 Radiculopathy, lumbar region: Secondary | ICD-10-CM

## 2020-01-16 DIAGNOSIS — R293 Abnormal posture: Secondary | ICD-10-CM

## 2020-01-16 NOTE — Therapy (Signed)
[facility] [address] [place], [place], [address] Phone: [phone]   Fax:  [phone]  Physical Therapy Treatment  Patient Details  Name: GRAYLYN BUNNEY MRN: [id number] Date of Birth: 03-26-1951 Referring Provider (PT): Tereasa Coop NP    Encounter Date: 01/16/2020  PT End of Session - 01/16/20 0813    Visit Number  2    Number of Visits  8    Date for PT Re-Evaluation  02/14/20    Authorization Type  [employer] Medicare, no visit limit, no auth req    Authorization Time Period  01/14/20-02/14/20    Authorization - Visit Number  2    Authorization - Number of Visits  10    Progress Note Due on Visit  8    PT Start Time  0815    PT Stop Time  0855    PT Time Calculation (min)  40 min    Activity Tolerance  Patient tolerated treatment well    Behavior During Therapy  [facility] for tasks assessed/performed       Past Medical History:  Diagnosis Date  . Anxiety   . At low risk for fall 08/15/2018  . Cystitis   . Depression   . Gastritis   . GERD (gastroesophageal reflux disease)   . Hashimoto's thyroiditis    Hx   . Hyperlipidemia   . Hypertension   . Hypothyroidism   . Normocytic anemia 2009 Hb 9.9-11.1  . Osteopenia   . Shoulder pain   . Urinary incontinence     Past Surgical History:  Procedure Laterality Date  . Bilateral tubal ligation    . BREAST EXCISIONAL BIOPSY  2010   Excisional biopsy of benign left breast mass -lopoma   . BREAST SURGERY     left nreast biopsy  . COLONOSCOPY  08/26/2011   SLF: 1. Internal hemorrhoids  . COLONOSCOPY N/A 10/06/2015   [id number]:[id number] fissure or internal hemorrhoids/mild sigmoid colitis. benign colonic path   . colonscopy  2005   Dr. Katrinka Blazing  . ESOPHAGOGASTRODUODENOSCOPY  12/2009   chronic gastritis  . ESOPHAGOGASTRODUODENOSCOPY N/A 05/17/2017   Dr. Darrick Penna; gastritis, normal small bowel biopsies, multiple gastric polyps. fundic gland polyps  . GIVENS CAPSULE STUDY N/A 05/30/2017   gastritis,  no source for anemia identified  . left knee surgery Left March 1,2017  . PARTIAL KNEE ARTHROPLASTY Left 08/09/2017   Procedure: LEFT UNICOMPARTMENTAL KNEE;  Surgeon: Ollen Gross, MD;  Location: [facility];  Service: Orthopedics;  Laterality: Left;  . Resection of left lobe of thyroid    . right carpal tunnel release    . rt. knee athroscopy  2004  . SAVORY DILATION N/A 05/17/2017   Procedure: SAVORY DILATION;  Surgeon: West Bali, MD;  [facility]: [facility];  Service: Endoscopy;  Laterality: N/A;  . TOTAL ABDOMINAL HYSTERECTOMY  1994  . UMBILICAL HERNIA REPAIR    . Urethral dilation for stenosis  2009    There were no vitals filed for this visit.  Subjective Assessment - 01/16/20 0820    Subjective  Patient says she really liked HEP stretches and they made her better. Says she thinks she did too much though because her knee was a little sore.    Limitations  Sitting;Lifting;Standing;Walking;House hold activities    How long can you sit comfortably?  15-20 minutes    How long can you stand comfortably?  30 minutes    How long can you walk comfortably?  no problem, pain  is better when moving    Diagnostic tests  xrays    Patient Stated Goals  stop pain in my back    Currently in Pain?  Yes    Pain Score  3     Pain Location  Back    Pain Orientation  Left;Posterior    Pain Descriptors / Indicators  Discomfort;Sharp;Stabbing    Pain Type  Acute pain    Pain Onset  1 to 4 weeks ago    Pain Frequency  Constant                       [facility] Adult PT Treatment/Exercise - 01/16/20 0001      Lumbar Exercises: Stretches   Single Knee to Chest Stretch  Right;Left    Single Knee to Chest Stretch Limitations  10x 5"      Lumbar Exercises: Supine   Ab Set  15 reps;5 seconds    Pelvic Tilt  15 reps;5 seconds    Glut Set  15 reps;5 seconds    Bridge  10 reps    Bridge Limitations  5 sec hold       Manual Therapy   Manual Therapy  Soft tissue mobilization    Manual  therapy comments  performed seperately from all other interventions    Soft tissue mobilization  IASTM using theragun to bilateral lumbar paraspinals with patient in prone for pain and restrictions               PT Short Term Goals - 01/14/20 0908      PT SHORT TERM GOAL #1   Title  Patient will be independent with initial HEP to improve functional outcomes    Time  2    Period  Weeks    Status  New    Target Date  01/31/20        PT Long Term Goals - 01/14/20 0908      PT LONG TERM GOAL #1   Title  Patient will improve FOTO score to <40% to indicate improvement in functional outcomes    Time  4    Period  Weeks    Status  New    Target Date  02/14/20      PT LONG TERM GOAL #2   Title  Patient will report at least 75% overall improvement in subjective complaint to indicate improvement in ability to perform ADLs.    Time  4    Period  Weeks    Status  New    Target Date  02/14/20      PT LONG TERM GOAL #3   Title  Patient will have equal to or > 4+/5 MMT throughout BLE to improve ability to perform functional mobility, stair ambulation and ADLs.    Time  4    Period  Weeks    Status  New    Target Date  02/14/20      PT LONG TERM GOAL #4   Title  Pateint will be able to stand >1 hour with no increased pain to indicate improved functional outcomes and ability to wash dishes and cook dinner    Time  4    Period  Weeks    Status  New    Target Date  02/14/20            Plan - 01/16/20 0901    Clinical Impression Statement  Patient tolerated treatment well today. Reviewed goals and HEP. Patient  required verbal cues for proper body mechanics with pelvis tilts and encouraged to use tactile cue from hands behind back to improve form. Added glute sets and hip bridge for glute and core strengthening. Patient notes slight "pinch" in lumbar with bridging, but resolved with corrected form for cues to push through heels and perform through comfortable ROM. Added IASTM  with theragun to lumbar paraspinals for decreased pain and muscle restrictions. Patient reports pain reduction to 2/10 post treatment.    Examination-Activity Limitations  Bend;Squat;Sleep;Sit;Stairs;Caring for Others;Carry;Stand;Dressing;Transfers;Locomotion Level;Lift    Examination-Participation Restrictions  Community Activity;Yard Work    Stability/Clinical Decision Making  Stable/Uncomplicated    Rehab Potential  Good    PT Frequency  2x / week    PT Duration  4 weeks    PT Treatment/Interventions  ADLs/Self Care Home Management;Aquatic Therapy;Biofeedback;Cryotherapy;Electrical Stimulation;Iontophoresis 4mg /ml Dexamethasone;Moist Heat;Traction;Balance training;Therapeutic exercise;Manual techniques;Vestibular;Therapeutic activities;Taping;Functional mobility training;Splinting;Vasopneumatic Device;Energy conservation;Joint Manipulations;Spinal Manipulations;Passive range of motion;Dry needling;Patient/family education;Orthotic Fit/Training;Stair training;Gait training;DME Instruction;Ultrasound;Neuromuscular re-education;Scar mobilization    PT Next Visit Plan  Progress core and hip strenghening as tolerated. Add heel slides, piriformis stretch, clamshell. Manuals as indicated    PT Home Exercise Plan  01/14/20: ab set, pelvic tilts, SKTC with towel; 01/16/20: glute set    Consulted and Agree with Plan of Care  Patient       Patient will benefit from skilled therapeutic intervention in order to improve the following deficits and impairments:  Abnormal gait, Hypomobility, Decreased activity tolerance, Decreased strength, Pain, Decreased balance, Decreased mobility, Difficulty walking, Decreased range of motion, Improper body mechanics, Impaired flexibility, Postural dysfunction  Visit Diagnosis: Radiculopathy, lumbar region  Abnormal posture     Problem List Patient Active Problem List   Diagnosis Date Noted  . Lumbar spine painful on movement 12/02/2019  . Bursitis of right shoulder  10/21/2019  . Obesity (BMI 30.0-34.9) 06/17/2019  . OA (osteoarthritis) of knee 08/09/2017  . Constipation 02/05/2016  . Allergic rhinitis 10/08/2014  . GAD (generalized anxiety disorder) 09/23/2014  . Back pain with radiculopathy 04/14/2014  . POSTSURGICAL HYPOTHYROIDISM 06/22/2010  . Normocytic anemia 08/04/2009  . GERD 04/20/2009  . Hyperlipidemia LDL goal <100 05/16/2008  . Essential hypertension 05/16/2008  . OSTEOPENIA 05/16/2008  . Urinary incontinence 05/16/2008   9:04 AM, 01/16/20 Josue Hector PT DPT  Physical Therapist with [facility]  [phone] [place] [address] [place], [place], [address] Phone: [phone]   Fax:  [phone]  Name: DOMENIQUE QUEST MRN: [id number] Date of Birth: 14-Jul-1951

## 2020-01-20 ENCOUNTER — Ambulatory Visit (HOSPITAL_COMMUNITY): Payer: Medicare Other | Admitting: Physical Therapy

## 2020-01-20 ENCOUNTER — Other Ambulatory Visit: Payer: Self-pay

## 2020-01-20 DIAGNOSIS — M5416 Radiculopathy, lumbar region: Secondary | ICD-10-CM | POA: Diagnosis not present

## 2020-01-20 DIAGNOSIS — R293 Abnormal posture: Secondary | ICD-10-CM | POA: Diagnosis not present

## 2020-01-20 NOTE — Therapy (Signed)
[facility] [address] [place], [place], [address] Phone: [phone]   Fax:  [phone]  Physical Therapy Treatment  Patient Details  Name: Laura [name] MRN: [id number] Date of Birth: 1950-12-29 Referring Provider (PT): Tereasa Coop NP    Encounter Date: 01/20/2020  PT End of Session - 01/20/20 1021    Visit Number  3    Number of Visits  8    Date for PT Re-Evaluation  02/14/20    Authorization Type  [employer] Medicare, no visit limit, no auth req    Authorization Time Period  01/14/20-02/14/20    Authorization - Visit Number  3    Authorization - Number of Visits  10    Progress Note Due on Visit  8    PT Start Time  0830    PT Stop Time  0920    PT Time Calculation (min)  50 min    Activity Tolerance  Patient tolerated treatment well    Behavior During Therapy  [facility] for tasks assessed/performed       Past Medical History:  Diagnosis Date  . Anxiety   . At low risk for fall 08/15/2018  . Cystitis   . Depression   . Gastritis   . GERD (gastroesophageal reflux disease)   . Hashimoto's thyroiditis    Hx   . Hyperlipidemia   . Hypertension   . Hypothyroidism   . Normocytic anemia 2009 Hb 9.9-11.1  . Osteopenia   . Shoulder pain   . Urinary incontinence     Past Surgical History:  Procedure Laterality Date  . Bilateral tubal ligation    . BREAST EXCISIONAL BIOPSY  2010   Excisional biopsy of benign left breast mass -lopoma   . BREAST SURGERY     left nreast biopsy  . COLONOSCOPY  08/26/2011   SLF: 1. Internal hemorrhoids  . COLONOSCOPY N/A 10/06/2015   [id number]:[id number] fissure or internal hemorrhoids/mild sigmoid colitis. benign colonic path   . colonscopy  2005   Dr. Katrinka Blazing  . ESOPHAGOGASTRODUODENOSCOPY  12/2009   chronic gastritis  . ESOPHAGOGASTRODUODENOSCOPY N/A 05/17/2017   Dr. Darrick Penna; gastritis, normal small bowel biopsies, multiple gastric polyps. fundic gland polyps  . GIVENS CAPSULE STUDY N/A 05/30/2017   gastritis,  no source for anemia identified  . left knee surgery Left March 1,2017  . PARTIAL KNEE ARTHROPLASTY Left 08/09/2017   Procedure: LEFT UNICOMPARTMENTAL KNEE;  Surgeon: Ollen Gross, MD;  Location: [facility];  Service: Orthopedics;  Laterality: Left;  . Resection of left lobe of thyroid    . right carpal tunnel release    . rt. knee athroscopy  2004  . SAVORY DILATION N/A 05/17/2017   Procedure: SAVORY DILATION;  Surgeon: West Bali, MD;  [facility];  Service: Endoscopy;  Laterality: N/A;  . TOTAL ABDOMINAL HYSTERECTOMY  1994  . UMBILICAL HERNIA REPAIR    . Urethral dilation for stenosis  2009    There were no vitals filed for this visit.  Subjective Assessment - 01/20/20 0838    Subjective  Pt states she is hurting a little more today than usual. Denies any activities that wouldve increased her pain.  STates standing washing dishes and sitting/driving bothers her back the most.  Currently 5/10.    Currently in Pain?  Yes    Pain Score  5     Pain Location  Back    Pain Orientation  Left;Posterior    Pain Descriptors / Indicators  Discomfort;Sharp    Pain Type  Acute pain                       [facility] Adult PT Treatment/Exercise - 01/20/20 0001      Lumbar Exercises: Stretches   Active Hamstring Stretch  Right;Left;3 reps;30 seconds    Active Hamstring Stretch Limitations  with towel in supine    Single Knee to Chest Stretch  Right;Left    Single Knee to Chest Stretch Limitations  10x 5"    Piriformis Stretch  Right;Left;2 reps;30 seconds    Piriformis Stretch Limitations  seated      Lumbar Exercises: Supine   Ab Set  15 reps;5 seconds    Pelvic Tilt  15 reps;5 seconds    Clam  10 reps    Clam Limitations  with lumbar stab    Heel Slides  5 reps    Heel Slides Limitations  with lumbar stab    Bridge  15 reps    Bridge Limitations  5 sec hold       Manual Therapy   Manual Therapy  Soft tissue mobilization;Other (comment)    Manual therapy  comments  performed seperately from all other interventions    Soft tissue mobilization  IASTM using theragun to bilateral lumbar paraspinals with patient in prone for pain and restrictions    Other Manual Therapy  level 8 with small ball attachment bilatera paraspinals approx 4 mins each side               PT Short Term Goals - 01/14/20 0908      PT SHORT TERM GOAL #1   Title  Patient will be independent with initial HEP to improve functional outcomes    Time  2    Period  Weeks    Status  New    Target Date  01/31/20        PT Long Term Goals - 01/14/20 0908      PT LONG TERM GOAL #1   Title  Patient will improve FOTO score to <40% to indicate improvement in functional outcomes    Time  4    Period  Weeks    Status  New    Target Date  02/14/20      PT LONG TERM GOAL #2   Title  Patient will report at least 75% overall improvement in subjective complaint to indicate improvement in ability to perform ADLs.    Time  4    Period  Weeks    Status  New    Target Date  02/14/20      PT LONG TERM GOAL #3   Title  Patient will have equal to or > 4+/5 MMT throughout BLE to improve ability to perform functional mobility, stair ambulation and ADLs.    Time  4    Period  Weeks    Status  New    Target Date  02/14/20      PT LONG TERM GOAL #4   Title  Pateint will be able to stand >1 hour with no increased pain to indicate improved functional outcomes and ability to wash dishes and cook dinner    Time  4    Period  Weeks    Status  New    Target Date  02/14/20            Plan - 01/20/20 1022    Clinical Impression Statement  continued with POC with  progression of abdominal stabilization exercises and stretches.  Better understanding and performance of abdominal isometrics and pelvic tilt this session.  Added heelslides and clams with lumbar stabilization and seated piriformis stretch with good stretch obtained.  Completed theragun to reduce pain and mm restictions  in bilateral lumbar paraspinals at end of session.  Pt reported being painfreee at EOS (was 5/10 upon arrival).    Examination-Activity Limitations  Bend;Squat;Sleep;Sit;Stairs;Caring for Others;Carry;Stand;Dressing;Transfers;Locomotion Level;Lift    Examination-Participation Restrictions  Community Activity;Yard Work    Stability/Clinical Decision Making  Stable/Uncomplicated    Rehab Potential  Good    PT Frequency  2x / week    PT Duration  4 weeks    PT Treatment/Interventions  ADLs/Self Care Home Management;Aquatic Therapy;Biofeedback;Cryotherapy;Electrical Stimulation;Iontophoresis 4mg /ml Dexamethasone;Moist Heat;Traction;Balance training;Therapeutic exercise;Manual techniques;Vestibular;Therapeutic activities;Taping;Functional mobility training;Splinting;Vasopneumatic Device;Energy conservation;Joint Manipulations;Spinal Manipulations;Passive range of motion;Dry needling;Patient/family education;Orthotic Fit/Training;Stair training;Gait training;DME Instruction;Ultrasound;Neuromuscular re-education;Scar mobilization    PT Next Visit Plan  Progress core and hip strenghening as tolerated. Continue manual as indicated    PT Home Exercise Plan  01/14/20: ab set, pelvic tilts, SKTC with towel; 01/16/20: glute set    Consulted and Agree with Plan of Care  Patient       Patient will benefit from skilled therapeutic intervention in order to improve the following deficits and impairments:  Abnormal gait, Hypomobility, Decreased activity tolerance, Decreased strength, Pain, Decreased balance, Decreased mobility, Difficulty walking, Decreased range of motion, Improper body mechanics, Impaired flexibility, Postural dysfunction  Visit Diagnosis: Radiculopathy, lumbar region  Abnormal posture     Problem List Patient Active Problem List   Diagnosis Date Noted  . Lumbar spine painful on movement 12/02/2019  . Bursitis of right shoulder 10/21/2019  . Obesity (BMI 30.0-34.9) 06/17/2019  . OA  (osteoarthritis) of knee 08/09/2017  . Constipation 02/05/2016  . Allergic rhinitis 10/08/2014  . GAD (generalized anxiety disorder) 09/23/2014  . Back pain with radiculopathy 04/14/2014  . POSTSURGICAL HYPOTHYROIDISM 06/22/2010  . Normocytic anemia 08/04/2009  . GERD 04/20/2009  . Hyperlipidemia LDL goal <100 05/16/2008  . Essential hypertension 05/16/2008  . OSTEOPENIA 05/16/2008  . Urinary incontinence 05/16/2008   Teena Irani, PTA/CLT [phone]  Teena Irani 01/20/2020, 10:25 AM  [place], [place], [address] Phone: [phone]   Fax:  [phone]  Name: ESPERANSA SARABIA MRN: [id number] Date of Birth: 1951/02/18

## 2020-01-22 ENCOUNTER — Other Ambulatory Visit: Payer: Self-pay

## 2020-01-22 ENCOUNTER — Ambulatory Visit (HOSPITAL_COMMUNITY): Payer: Medicare Other | Admitting: Physical Therapy

## 2020-01-22 DIAGNOSIS — M5416 Radiculopathy, lumbar region: Secondary | ICD-10-CM

## 2020-01-22 DIAGNOSIS — R293 Abnormal posture: Secondary | ICD-10-CM | POA: Diagnosis not present

## 2020-01-22 NOTE — Therapy (Signed)
[facility] [address] [place], [place], [address] Phone: [phone]   Fax:  [phone]  Physical Therapy Treatment  Patient Details  Name: Laura [name] MRN: [id number] Date of Birth: April 18, 1951 Referring Provider (PT): Tereasa Coop NP    Encounter Date: 01/22/2020  PT End of Session - 01/22/20 0849    Visit Number  4    Number of Visits  8    Date for PT Re-Evaluation  02/14/20    Authorization Type  [employer] Medicare, no visit limit, no auth req    Authorization Time Period  01/14/20-02/14/20    Authorization - Visit Number  4    Authorization - Number of Visits  10    Progress Note Due on Visit  8    PT Start Time  0835    PT Stop Time  0920    PT Time Calculation (min)  45 min    Activity Tolerance  Patient tolerated treatment well    Behavior During Therapy  [facility] for tasks assessed/performed       Past Medical History:  Diagnosis Date  . Anxiety   . At low risk for fall 08/15/2018  . Cystitis   . Depression   . Gastritis   . GERD (gastroesophageal reflux disease)   . Hashimoto's thyroiditis    Hx   . Hyperlipidemia   . Hypertension   . Hypothyroidism   . Normocytic anemia 2009 Hb 9.9-11.1  . Osteopenia   . Shoulder pain   . Urinary incontinence     Past Surgical History:  Procedure Laterality Date  . Bilateral tubal ligation    . BREAST EXCISIONAL BIOPSY  2010   Excisional biopsy of benign left breast mass -lopoma   . BREAST SURGERY     left nreast biopsy  . COLONOSCOPY  08/26/2011   SLF: 1. Internal hemorrhoids  . COLONOSCOPY N/A 10/06/2015   ELF:YBOF fissure or internal hemorrhoids/mild sigmoid colitis. benign colonic path   . colonscopy  2005   Dr. Katrinka Blazing  . ESOPHAGOGASTRODUODENOSCOPY  12/2009   chronic gastritis  . ESOPHAGOGASTRODUODENOSCOPY N/A 05/17/2017   Dr. Darrick Penna; gastritis, normal small bowel biopsies, multiple gastric polyps. fundic gland polyps  . GIVENS CAPSULE STUDY N/A 05/30/2017   gastritis,  no source for anemia identified  . left knee surgery Left March 1,2017  . PARTIAL KNEE ARTHROPLASTY Left 08/09/2017   Procedure: LEFT UNICOMPARTMENTAL KNEE;  Surgeon: Ollen Gross, MD;  Location: [facility];  Service: Orthopedics;  Laterality: Left;  . Resection of left lobe of thyroid    . right carpal tunnel release    . rt. knee athroscopy  2004  . SAVORY DILATION N/A 05/17/2017   Procedure: SAVORY DILATION;  Surgeon: West Bali, MD;  [facility];  Service: Endoscopy;  Laterality: N/A;  . TOTAL ABDOMINAL HYSTERECTOMY  1994  . UMBILICAL HERNIA REPAIR    . Urethral dilation for stenosis  2009    There were no vitals filed for this visit.  Subjective Assessment - 01/22/20 0947    Subjective  Pt states she is feeling great, no return of pain since last visit.    Currently in Pain?  No/denies                       [facility] Adult PT Treatment/Exercise - 01/22/20 0001      Lumbar Exercises: Stretches   Single Knee to Chest Stretch  Right;Left    Single Knee  to Chest Stretch Limitations  10x 5"    Piriformis Stretch  Right;Left;2 reps;30 seconds    Piriformis Stretch Limitations  seated      Lumbar Exercises: Standing   Scapular Retraction  10 reps;Theraband    Theraband Level (Scapular Retraction)  Level 2 (Red)    Row  10 reps;Theraband    Theraband Level (Row)  Level 2 (Red)    Shoulder Extension  10 reps;Theraband    Theraband Level (Shoulder Extension)  Level 2 (Red)      Lumbar Exercises: Supine   Ab Set  15 reps;5 seconds    Pelvic Tilt  15 reps;5 seconds    Bridge  15 reps    Bridge Limitations  5 sec hold     Straight Leg Raise  10 reps;Limitations    Straight Leg Raises Limitations  with lumbar stabiliation      Lumbar Exercises: Sidelying   Clam  Both;10 reps    Clam Limitations  with lumbar stabiliation      Manual Therapy   Manual Therapy  Soft tissue mobilization;Other (comment)    Manual therapy comments  performed seperately  from all other interventions    Soft tissue mobilization  IASTM using theragun to bilateral lumbar paraspinals with patient in prone for pain and restrictions    Other Manual Therapy  level 12 with small ball attachment Lt upper lumbar 4', lower lumbar 4'               PT Short Term Goals - 01/14/20 0908      PT SHORT TERM GOAL #1   Title  Patient will be independent with initial HEP to improve functional outcomes    Time  2    Period  Weeks    Status  New    Target Date  01/31/20        PT Long Term Goals - 01/14/20 0908      PT LONG TERM GOAL #1   Title  Patient will improve FOTO score to <40% to indicate improvement in functional outcomes    Time  4    Period  Weeks    Status  New    Target Date  02/14/20      PT LONG TERM GOAL #2   Title  Patient will report at least 75% overall improvement in subjective complaint to indicate improvement in ability to perform ADLs.    Time  4    Period  Weeks    Status  New    Target Date  02/14/20      PT LONG TERM GOAL #3   Title  Patient will have equal to or > 4+/5 MMT throughout BLE to improve ability to perform functional mobility, stair ambulation and ADLs.    Time  4    Period  Weeks    Status  New    Target Date  02/14/20      PT LONG TERM GOAL #4   Title  Pateint will be able to stand >1 hour with no increased pain to indicate improved functional outcomes and ability to wash dishes and cook dinner    Time  4    Period  Weeks    Status  New    Target Date  02/14/20            Plan - 01/22/20 0949    Clinical Impression Statement  Pt overall progressing well with return today and no pain since last session.  Progressed  with postural strengthening using the theraband, advanced clams to sidelying and added SLR with abdominal stabilization.  Pt able to complete all with good form and no return of symptoms.  Ended session with massage gun to Lt lumbar to reduce mm tightness.    Examination-Activity Limitations   Bend;Squat;Sleep;Sit;Stairs;Caring for Others;Carry;Stand;Dressing;Transfers;Locomotion Level;Lift    Examination-Participation Restrictions  Community Activity;Yard Work    Stability/Clinical Decision Making  Stable/Uncomplicated    Rehab Potential  Good    PT Frequency  2x / week    PT Duration  4 weeks    PT Treatment/Interventions  ADLs/Self Care Home Management;Aquatic Therapy;Biofeedback;Cryotherapy;Electrical Stimulation;Iontophoresis 4mg /ml Dexamethasone;Moist Heat;Traction;Balance training;Therapeutic exercise;Manual techniques;Vestibular;Therapeutic activities;Taping;Functional mobility training;Splinting;Vasopneumatic Device;Energy conservation;Joint Manipulations;Spinal Manipulations;Passive range of motion;Dry needling;Patient/family education;Orthotic Fit/Training;Stair training;Gait training;DME Instruction;Ultrasound;Neuromuscular re-education;Scar mobilization    PT Next Visit Plan  Progress core and hip strenghening as tolerated. Continue manual as indicated    PT Home Exercise Plan  01/14/20: ab set, pelvic tilts, SKTC with towel; 01/16/20: glute set    Consulted and Agree with Plan of Care  Patient       Patient will benefit from skilled therapeutic intervention in order to improve the following deficits and impairments:  Abnormal gait, Hypomobility, Decreased activity tolerance, Decreased strength, Pain, Decreased balance, Decreased mobility, Difficulty walking, Decreased range of motion, Improper body mechanics, Impaired flexibility, Postural dysfunction  Visit Diagnosis: Radiculopathy, lumbar region  Abnormal posture     Problem List Patient Active Problem List   Diagnosis Date Noted  . Lumbar spine painful on movement 12/02/2019  . Bursitis of right shoulder 10/21/2019  . Obesity (BMI 30.0-34.9) 06/17/2019  . OA (osteoarthritis) of knee 08/09/2017  . Constipation 02/05/2016  . Allergic rhinitis 10/08/2014  . GAD (generalized anxiety disorder) 09/23/2014  . Back  pain with radiculopathy 04/14/2014  . POSTSURGICAL HYPOTHYROIDISM 06/22/2010  . Normocytic anemia 08/04/2009  . GERD 04/20/2009  . Hyperlipidemia LDL goal <100 05/16/2008  . Essential hypertension 05/16/2008  . OSTEOPENIA 05/16/2008  . Urinary incontinence 05/16/2008   Teena Irani, PTA/CLT [phone]  Teena Irani 01/22/2020, 9:51 AM  [place], [place], [address] Phone: [phone]   Fax:  [phone]  Name: DIXIE COPPA MRN: [id number] Date of Birth: 08-20-1951

## 2020-01-27 ENCOUNTER — Other Ambulatory Visit: Payer: Self-pay

## 2020-01-27 ENCOUNTER — Encounter (HOSPITAL_COMMUNITY): Payer: Self-pay | Admitting: Physical Therapy

## 2020-01-27 ENCOUNTER — Ambulatory Visit (HOSPITAL_COMMUNITY): Payer: Medicare Other | Admitting: Physical Therapy

## 2020-01-27 DIAGNOSIS — M5416 Radiculopathy, lumbar region: Secondary | ICD-10-CM

## 2020-01-27 DIAGNOSIS — R293 Abnormal posture: Secondary | ICD-10-CM

## 2020-01-27 NOTE — Therapy (Signed)
[facility] [address] [place], [place], [address] Phone: [phone]   Fax:  [phone]  Physical Therapy Treatment  Patient Details  Name: Laura [name] MRN: [id number] Date of Birth: 14-Oct-1951 Referring Provider (PT): Tereasa Coop NP    Encounter Date: 01/27/2020  PT End of Session - 01/27/20 0812    Visit Number  5    Number of Visits  8    Date for PT Re-Evaluation  02/14/20   FOTO complete visit 5   Authorization Type  [employer] Medicare, no visit limit, no auth req    Authorization Time Period  01/14/20-02/14/20    Authorization - Visit Number  5    Authorization - Number of Visits  10    Progress Note Due on Visit  8    PT Start Time  0817    PT Stop Time  0856    PT Time Calculation (min)  39 min    Activity Tolerance  Patient tolerated treatment well    Behavior During Therapy  [facility] for tasks assessed/performed       Past Medical History:  Diagnosis Date  . Anxiety   . At low risk for fall 08/15/2018  . Cystitis   . Depression   . Gastritis   . GERD (gastroesophageal reflux disease)   . Hashimoto's thyroiditis    Hx   . Hyperlipidemia   . Hypertension   . Hypothyroidism   . Normocytic anemia 2009 Hb 9.9-11.1  . Osteopenia   . Shoulder pain   . Urinary incontinence     Past Surgical History:  Procedure Laterality Date  . Bilateral tubal ligation    . BREAST EXCISIONAL BIOPSY  2010   Excisional biopsy of benign left breast mass -lopoma   . BREAST SURGERY     left nreast biopsy  . COLONOSCOPY  08/26/2011   SLF: 1. Internal hemorrhoids  . COLONOSCOPY N/A 10/06/2015   HOZ:YYQM fissure or internal hemorrhoids/mild sigmoid colitis. benign colonic path   . colonscopy  2005   Dr. Katrinka Blazing  . ESOPHAGOGASTRODUODENOSCOPY  12/2009   chronic gastritis  . ESOPHAGOGASTRODUODENOSCOPY N/A 05/17/2017   Dr. Darrick Penna; gastritis, normal small bowel biopsies, multiple gastric polyps. fundic gland polyps  . GIVENS CAPSULE STUDY N/A  05/30/2017   gastritis, no source for anemia identified  . left knee surgery Left March 1,2017  . PARTIAL KNEE ARTHROPLASTY Left 08/09/2017   Procedure: LEFT UNICOMPARTMENTAL KNEE;  Surgeon: Ollen Gross, MD;  Location: [facility];  Service: Orthopedics;  Laterality: Left;  . Resection of left lobe of thyroid    . right carpal tunnel release    . rt. knee athroscopy  2004  . SAVORY DILATION N/A 05/17/2017   Procedure: SAVORY DILATION;  Surgeon: West Bali, MD;  [facility];  Service: Endoscopy;  Laterality: N/A;  . TOTAL ABDOMINAL HYSTERECTOMY  1994  . UMBILICAL HERNIA REPAIR    . Urethral dilation for stenosis  2009    There were no vitals filed for this visit.  Subjective Assessment - 01/27/20 0824    Subjective  Patient says she had a good week last week. Says she has no back pain currently and exercises are going well. Says he did have some trouble with her LT knee yesterday but is better today.         [facility] PT Assessment - 01/27/20 0001      Observation/Other Assessments   Focus on Therapeutic Outcomes (FOTO)   39%  limited    was 47%                  [facility] Adult PT Treatment/Exercise - 01/27/20 0001      Lumbar Exercises: Stretches   Single Knee to Chest Stretch  Right;Left;5 reps;10 seconds      Lumbar Exercises: Standing   Row  Theraband;15 reps    Theraband Level (Row)  Level 2 (Red)    Shoulder Extension  Theraband;15 reps    Theraband Level (Shoulder Extension)  Level 2 (Red)    Other Standing Lumbar Exercises  palloff press RTB x 10 each       Lumbar Exercises: Supine   Ab Set  10 reps;5 seconds    Pelvic Tilt  10 reps;5 seconds    Bent Knee Raise  20 reps    Bridge  10 reps;5 seconds      Manual Therapy   Manual Therapy  Soft tissue mobilization;Other (comment)    Manual therapy comments  performed seperately from all other interventions    Soft tissue mobilization  IASTM using theragun to bilateral lumbar paraspinals with  patient in prone for pain and restrictions               PT Short Term Goals - 01/14/20 0908      PT SHORT TERM GOAL #1   Title  Patient will be independent with initial HEP to improve functional outcomes    Time  2    Period  Weeks    Status  New    Target Date  01/31/20        PT Long Term Goals - 01/14/20 0908      PT LONG TERM GOAL #1   Title  Patient will improve FOTO score to <40% to indicate improvement in functional outcomes    Time  4    Period  Weeks    Status  New    Target Date  02/14/20      PT LONG TERM GOAL #2   Title  Patient will report at least 75% overall improvement in subjective complaint to indicate improvement in ability to perform ADLs.    Time  4    Period  Weeks    Status  New    Target Date  02/14/20      PT LONG TERM GOAL #3   Title  Patient will have equal to or > 4+/5 MMT throughout BLE to improve ability to perform functional mobility, stair ambulation and ADLs.    Time  4    Period  Weeks    Status  New    Target Date  02/14/20      PT LONG TERM GOAL #4   Title  Pateint will be able to stand >1 hour with no increased pain to indicate improved functional outcomes and ability to wash dishes and cook dinner    Time  4    Period  Weeks    Status  New    Target Date  02/14/20            Plan - 01/27/20 0857    Clinical Impression Statement  Patient tolerated treatment well today. Patient did note some discomfort on RT side of lumbar with pelvic tilts described as "twinge", but not as bad as discomfort felt initially. This discomfort subsided with rest, patient cued on intensity of exercise and limited ROM to pain free range if able. Progressed core strengthening with increased reps of TheraBand  exercise, and added palloff pressing. Patient educated on proper form and function, and tolerated well with no increased pain.    Examination-Activity Limitations  Bend;Squat;Sleep;Sit;Stairs;Caring for  Others;Carry;Stand;Dressing;Transfers;Locomotion Level;Lift    Examination-Participation Restrictions  Community Activity;Yard Work    Stability/Clinical Decision Making  Stable/Uncomplicated    Rehab Potential  Good    PT Frequency  2x / week    PT Duration  4 weeks    PT Treatment/Interventions  ADLs/Self Care Home Management;Aquatic Therapy;Biofeedback;Cryotherapy;Electrical Stimulation;Iontophoresis 4mg /ml Dexamethasone;Moist Heat;Traction;Balance training;Therapeutic exercise;Manual techniques;Vestibular;Therapeutic activities;Taping;Functional mobility training;Splinting;Vasopneumatic Device;Energy conservation;Joint Manipulations;Spinal Manipulations;Passive range of motion;Dry needling;Patient/family education;Orthotic Fit/Training;Stair training;Gait training;DME Instruction;Ultrasound;Neuromuscular re-education;Scar mobilization    PT Next Visit Plan  Progress core and hip strenghening as tolerated. Continue manual as indicated. Add sidestepping and step ups next visit    PT Home Exercise Plan  01/14/20: ab set, pelvic tilts, SKTC with towel; 01/16/20: glute set    Consulted and Agree with Plan of Care  Patient       Patient will benefit from skilled therapeutic intervention in order to improve the following deficits and impairments:  Abnormal gait, Hypomobility, Decreased activity tolerance, Decreased strength, Pain, Decreased balance, Decreased mobility, Difficulty walking, Decreased range of motion, Improper body mechanics, Impaired flexibility, Postural dysfunction  Visit Diagnosis: Radiculopathy, lumbar region  Abnormal posture     Problem List Patient Active Problem List   Diagnosis Date Noted  . Lumbar spine painful on movement 12/02/2019  . Bursitis of right shoulder 10/21/2019  . Obesity (BMI 30.0-34.9) 06/17/2019  . OA (osteoarthritis) of knee 08/09/2017  . Constipation 02/05/2016  . Allergic rhinitis 10/08/2014  . GAD (generalized anxiety disorder) 09/23/2014  .  Back pain with radiculopathy 04/14/2014  . POSTSURGICAL HYPOTHYROIDISM 06/22/2010  . Normocytic anemia 08/04/2009  . GERD 04/20/2009  . Hyperlipidemia LDL goal <100 05/16/2008  . Essential hypertension 05/16/2008  . OSTEOPENIA 05/16/2008  . Urinary incontinence 05/16/2008   9:02 AM, 01/27/20 Josue Hector PT DPT  Physical Therapist with [facility]  [phone] [address] [place], [place], [address] Phone: [phone]   Fax:  [phone]  Name: Laura [name] MRN: [id number] Date of Birth: [birth year]

## 2020-01-29 ENCOUNTER — Ambulatory Visit (HOSPITAL_COMMUNITY): Payer: Medicare Other | Admitting: Physical Therapy

## 2020-01-29 ENCOUNTER — Encounter (HOSPITAL_COMMUNITY): Payer: Self-pay | Admitting: Physical Therapy

## 2020-01-29 ENCOUNTER — Other Ambulatory Visit: Payer: Self-pay

## 2020-01-29 DIAGNOSIS — M5416 Radiculopathy, lumbar region: Secondary | ICD-10-CM

## 2020-01-29 DIAGNOSIS — R293 Abnormal posture: Secondary | ICD-10-CM | POA: Diagnosis not present

## 2020-01-29 NOTE — Therapy (Signed)
[facility] [address] [place], [place], [address] Phone: [phone]   Fax:  [phone]  Physical Therapy Treatment  Patient Details  Name: Laura [name] MRN: [id number] Date of Birth: 29-Nov-1950 Referring Provider (PT): Tereasa Coop NP    Encounter Date: 01/29/2020  PT End of Session - 01/29/20 0824    Visit Number  6    Number of Visits  8    Date for PT Re-Evaluation  02/14/20   FOTO complete visit 5   Authorization Type  [employer] Medicare, no visit limit, no auth req    Authorization Time Period  01/14/20-02/14/20    Authorization - Visit Number  6    Authorization - Number of Visits  10    Progress Note Due on Visit  8    PT Start Time  0816    PT Stop Time  0856    PT Time Calculation (min)  40 min    Activity Tolerance  Patient tolerated treatment well    Behavior During Therapy  [facility] for tasks assessed/performed       Past Medical History:  Diagnosis Date  . Anxiety   . At low risk for fall 08/15/2018  . Cystitis   . Depression   . Gastritis   . GERD (gastroesophageal reflux disease)   . Hashimoto's thyroiditis    Hx   . Hyperlipidemia   . Hypertension   . Hypothyroidism   . Normocytic anemia 2009 Hb 9.9-11.1  . Osteopenia   . Shoulder pain   . Urinary incontinence     Past Surgical History:  Procedure Laterality Date  . Bilateral tubal ligation    . BREAST EXCISIONAL BIOPSY  2010   Excisional biopsy of benign left breast mass -lopoma   . BREAST SURGERY     left nreast biopsy  . COLONOSCOPY  08/26/2011   SLF: 1. Internal hemorrhoids  . COLONOSCOPY N/A 10/06/2015   VQM:GQQP fissure or internal hemorrhoids/mild sigmoid colitis. benign colonic path   . colonscopy  2005   Dr. Katrinka Blazing  . ESOPHAGOGASTRODUODENOSCOPY  12/2009   chronic gastritis  . ESOPHAGOGASTRODUODENOSCOPY N/A 05/17/2017   Dr. Darrick Penna; gastritis, normal small bowel biopsies, multiple gastric polyps. fundic gland polyps  . GIVENS CAPSULE STUDY N/A  05/30/2017   gastritis, no source for anemia identified  . left knee surgery Left March 1,2017  . PARTIAL KNEE ARTHROPLASTY Left 08/09/2017   Procedure: LEFT UNICOMPARTMENTAL KNEE;  Surgeon: Ollen Gross, MD;  Location: [facility];  Service: Orthopedics;  Laterality: Left;  . Resection of left lobe of thyroid    . right carpal tunnel release    . rt. knee athroscopy  2004  . SAVORY DILATION N/A 05/17/2017   Procedure: SAVORY DILATION;  Surgeon: West Bali, MD;  [facility];  Service: Endoscopy;  Laterality: N/A;  . TOTAL ABDOMINAL HYSTERECTOMY  1994  . UMBILICAL HERNIA REPAIR    . Urethral dilation for stenosis  2009    There were no vitals filed for this visit.  Subjective Assessment - 01/29/20 0823    Subjective  Patient says she has a burning sensation in RT posterior hip since yesterday. Notes its in the same spot she got her shot. Says it is still bothering her a little today, says she did nothing differently. Says it feels better when she does her exercises.    Currently in Pain?  Yes    Pain Score  3     Pain Location  Back    Pain Orientation  Right;Posterior    Pain Descriptors / Indicators  Burning    Pain Onset  Yesterday    Pain Frequency  Constant                       [facility] Adult PT Treatment/Exercise - 01/29/20 0001      Lumbar Exercises: Stretches   Single Knee to Chest Stretch  Right;Left;5 reps;10 seconds    Piriformis Stretch  Right;Left;3 reps;30 seconds      Lumbar Exercises: Standing   Other Standing Lumbar Exercises  palloff press RTB x 15 each     Other Standing Lumbar Exercises  standing hip abduction and extension x15 each with GTB      Lumbar Exercises: Supine   Ab Set  10 reps;5 seconds    Pelvic Tilt  10 reps;5 seconds    Bridge  10 reps;5 seconds      Manual Therapy   Manual Therapy  Soft tissue mobilization    Manual therapy comments  performed seperately from all other interventions    Soft tissue mobilization   IASTM using theragun to bilateral lumbar paraspinals and gluteals with patient in prone for pain and restrictions               PT Short Term Goals - 01/14/20 0908      PT SHORT TERM GOAL #1   Title  Patient will be independent with initial HEP to improve functional outcomes    Time  2    Period  Weeks    Status  New    Target Date  01/31/20        PT Long Term Goals - 01/14/20 0908      PT LONG TERM GOAL #1   Title  Patient will improve FOTO score to <40% to indicate improvement in functional outcomes    Time  4    Period  Weeks    Status  New    Target Date  02/14/20      PT LONG TERM GOAL #2   Title  Patient will report at least 75% overall improvement in subjective complaint to indicate improvement in ability to perform ADLs.    Time  4    Period  Weeks    Status  New    Target Date  02/14/20      PT LONG TERM GOAL #3   Title  Patient will have equal to or > 4+/5 MMT throughout BLE to improve ability to perform functional mobility, stair ambulation and ADLs.    Time  4    Period  Weeks    Status  New    Target Date  02/14/20      PT LONG TERM GOAL #4   Title  Pateint will be able to stand >1 hour with no increased pain to indicate improved functional outcomes and ability to wash dishes and cook dinner    Time  4    Period  Weeks    Status  New    Target Date  02/14/20            Plan - 01/29/20 0901    Clinical Impression Statement  Patient noted decreased discomfort in RT posterior hip with added piriformis stretching. Patient required verbal cueing for foot placement with hip bridging. Added hip abduction and extension with ther band for improved hip and core strength/ stabilization. Patient required verbal cues and demo for proper  form, cued on avoiding ER of LEs. Patient reported no pain post treatment. Patient states she would like to try aquatic therapy starting next week.    Examination-Activity Limitations  Bend;Squat;Sleep;Sit;Stairs;Caring  for Others;Carry;Stand;Dressing;Transfers;Locomotion Level;Lift    Examination-Participation Restrictions  Community Activity;Yard Work    Stability/Clinical Decision Making  Stable/Uncomplicated    Rehab Potential  Good    PT Frequency  2x / week    PT Duration  4 weeks    PT Treatment/Interventions  ADLs/Self Care Home Management;Aquatic Therapy;Biofeedback;Cryotherapy;Electrical Stimulation;Iontophoresis 4mg /ml Dexamethasone;Moist Heat;Traction;Balance training;Therapeutic exercise;Manual techniques;Vestibular;Therapeutic activities;Taping;Functional mobility training;Splinting;Vasopneumatic Device;Energy conservation;Joint Manipulations;Spinal Manipulations;Passive range of motion;Dry needling;Patient/family education;Orthotic Fit/Training;Stair training;Gait training;DME Instruction;Ultrasound;Neuromuscular re-education;Scar mobilization    PT Next Visit Plan  Reassess next visit, add to aquatic schedule    PT Home Exercise Plan  01/14/20: ab set, pelvic tilts, SKTC with towel; 01/16/20: glute set    Consulted and Agree with Plan of Care  Patient       Patient will benefit from skilled therapeutic intervention in order to improve the following deficits and impairments:  Abnormal gait, Hypomobility, Decreased activity tolerance, Decreased strength, Pain, Decreased balance, Decreased mobility, Difficulty walking, Decreased range of motion, Improper body mechanics, Impaired flexibility, Postural dysfunction  Visit Diagnosis: Radiculopathy, lumbar region  Abnormal posture     Problem List Patient Active Problem List   Diagnosis Date Noted  . Lumbar spine painful on movement 12/02/2019  . Bursitis of right shoulder 10/21/2019  . Obesity (BMI 30.0-34.9) 06/17/2019  . OA (osteoarthritis) of knee 08/09/2017  . Constipation 02/05/2016  . Allergic rhinitis 10/08/2014  . GAD (generalized anxiety disorder) 09/23/2014  . Back pain with radiculopathy 04/14/2014  . POSTSURGICAL HYPOTHYROIDISM  06/22/2010  . Normocytic anemia 08/04/2009  . GERD 04/20/2009  . Hyperlipidemia LDL goal <100 05/16/2008  . Essential hypertension 05/16/2008  . OSTEOPENIA 05/16/2008  . Urinary incontinence 05/16/2008    9:03 AM, 01/29/20 01/31/20 PT DPT  Physical Therapist with [facility]  [facility]  [phone]   [facility] [address] [place], [place], [place] Phone: [phone]   Fax:  [phone]  Name: ALLENE FURUYA MRN: [name] Date of Birth: January 28, 1951

## 2020-02-04 ENCOUNTER — Telehealth (HOSPITAL_COMMUNITY): Payer: Self-pay | Admitting: Physical Therapy

## 2020-02-04 ENCOUNTER — Ambulatory Visit (HOSPITAL_COMMUNITY): Payer: Medicare Other | Admitting: Physical Therapy

## 2020-02-04 NOTE — Telephone Encounter (Signed)
pt called to cx says she does not feel well.

## 2020-02-06 ENCOUNTER — Ambulatory Visit (HOSPITAL_COMMUNITY): Payer: Medicare Other | Attending: Family Medicine | Admitting: Physical Therapy

## 2020-02-06 ENCOUNTER — Encounter (HOSPITAL_COMMUNITY): Payer: Self-pay | Admitting: Physical Therapy

## 2020-02-06 ENCOUNTER — Other Ambulatory Visit: Payer: Self-pay

## 2020-02-06 DIAGNOSIS — R293 Abnormal posture: Secondary | ICD-10-CM | POA: Diagnosis not present

## 2020-02-06 DIAGNOSIS — M5416 Radiculopathy, lumbar region: Secondary | ICD-10-CM | POA: Diagnosis not present

## 2020-02-06 NOTE — Therapy (Signed)
[facility] [address] [place], [place], [address] Phone: [phone]   Fax:  [phone]  Physical Therapy Treatment  Patient Details  Name: Laura [name] MRN: [id number] Date of Birth: Jun 01, 1951 Referring Provider (PT): Tereasa Coop NP    Encounter Date: 02/06/2020  PT End of Session - 02/06/20 1455    Visit Number  7    Number of Visits  8    Date for PT Re-Evaluation  02/14/20   FOTO complete visit 5   Authorization Type  [employer] Medicare, no visit limit, no auth req    Authorization Time Period  01/14/20-02/14/20    Authorization - Visit Number  7    Authorization - Number of Visits  10    Progress Note Due on Visit  8    PT Start Time  1455    PT Stop Time  1540    PT Time Calculation (min)  45 min    Activity Tolerance  Patient tolerated treatment well    Behavior During Therapy  [facility] for tasks assessed/performed       Past Medical History:  Diagnosis Date  . Anxiety   . At low risk for fall 08/15/2018  . Cystitis   . Depression   . Gastritis   . GERD (gastroesophageal reflux disease)   . Hashimoto's thyroiditis    Hx   . Hyperlipidemia   . Hypertension   . Hypothyroidism   . Normocytic anemia 2009 Hb 9.9-11.1  . Osteopenia   . Shoulder pain   . Urinary incontinence     Past Surgical History:  Procedure Laterality Date  . Bilateral tubal ligation    . BREAST EXCISIONAL BIOPSY  2010   Excisional biopsy of benign left breast mass -lopoma   . BREAST SURGERY     left nreast biopsy  . COLONOSCOPY  08/26/2011   SLF: 1. Internal hemorrhoids  . COLONOSCOPY N/A 10/06/2015   OTL:XBWI fissure or internal hemorrhoids/mild sigmoid colitis. benign colonic path   . colonscopy  2005   Dr. Katrinka Blazing  . ESOPHAGOGASTRODUODENOSCOPY  12/2009   chronic gastritis  . ESOPHAGOGASTRODUODENOSCOPY N/A 05/17/2017   Dr. Darrick Penna; gastritis, normal small bowel biopsies, multiple gastric polyps. fundic gland polyps  . GIVENS CAPSULE STUDY N/A  05/30/2017   gastritis, no source for anemia identified  . left knee surgery Left March 1,2017  . PARTIAL KNEE ARTHROPLASTY Left 08/09/2017   Procedure: LEFT UNICOMPARTMENTAL KNEE;  Surgeon: Ollen Gross, MD;  [facility]: [facility];  Service: Orthopedics;  Laterality: Left;  . Resection of left lobe of thyroid    . right carpal tunnel release    . rt. knee athroscopy  2004  . SAVORY DILATION N/A 05/17/2017   Procedure: SAVORY DILATION;  Surgeon: West Bali, MD;  [facility];  Service: Endoscopy;  Laterality: N/A;  . TOTAL ABDOMINAL HYSTERECTOMY  1994  . UMBILICAL HERNIA REPAIR    . Urethral dilation for stenosis  2009    There were no vitals filed for this visit.  Subjective Assessment - 02/06/20 1456    Subjective  Patient says she is doing pretty good good today. Says she is having no pain currenlty. Says she did have some slight pain on RT side yesterday, but has since gone away and has not othered her since.    Currently in Pain?  No/denies    Pain Onset  Yesterday  Adult Aquatic Therapy - 02/06/20 1552      Treatment   Gait  dynamic warmup: walking, high knee march, straight leg march, sidestepping 15' x 4RT each     Exercises  squats x20, noodle push/pull x20, noodle twist x20, noodle twist with chops x20, heel raises x20, noodle leg press x20 each, single DB alternating press down x10 each, double DB press down x10                 PT Education - 02/06/20 1550    Education Details  on benefits of aquatic therapy    Person(s) Educated  Patient    Methods  Explanation    Comprehension  Verbalized understanding       PT Short Term Goals - 01/14/20 0908      PT SHORT TERM GOAL #1   Title  Patient will be independent with initial HEP to improve functional outcomes    Time  2    Period  Weeks    Status  New    Target Date  01/31/20        PT Long Term Goals - 01/14/20 0908      PT LONG TERM GOAL #1   Title   Patient will improve FOTO score to <40% to indicate improvement in functional outcomes    Time  4    Period  Weeks    Status  New    Target Date  02/14/20      PT LONG TERM GOAL #2   Title  Patient will report at least 75% overall improvement in subjective complaint to indicate improvement in ability to perform ADLs.    Time  4    Period  Weeks    Status  New    Target Date  02/14/20      PT LONG TERM GOAL #3   Title  Patient will have equal to or > 4+/5 MMT throughout BLE to improve ability to perform functional mobility, stair ambulation and ADLs.    Time  4    Period  Weeks    Status  New    Target Date  02/14/20      PT LONG TERM GOAL #4   Title  Pateint will be able to stand >1 hour with no increased pain to indicate improved functional outcomes and ability to wash dishes and cook dinner    Time  4    Period  Weeks    Status  New    Target Date  02/14/20            Plan - 02/06/20 1547    Clinical Impression Statement  Patient tolerated session well today. Patient educated on benefits of aquatic therapy, and aquatic program initiated. Focused session on continuation of hip and core strengthening in gravity reduced position. Patient educated on purpose and funciton of all added ther ex. Patient cued on TA activation throughout session for imprved core strengtheing and lumbar stabilization. Patient reported improved mobility post treatment.    Examination-Activity Limitations  Bend;Squat;Sleep;Sit;Stairs;Caring for Others;Carry;Stand;Dressing;Transfers;Locomotion Level;Lift    Examination-Participation Restrictions  Community Activity;Yard Work    Stability/Clinical Decision Making  Stable/Uncomplicated    Rehab Potential  Good    PT Frequency  2x / week    PT Duration  4 weeks    PT Treatment/Interventions  ADLs/Self Care Home Management;Aquatic Therapy;Biofeedback;Cryotherapy;Electrical Stimulation;Iontophoresis 4mg /ml Dexamethasone;Moist Heat;Traction;Balance  training;Therapeutic exercise;Manual techniques;Vestibular;Therapeutic activities;Taping;Functional mobility training;Splinting;Vasopneumatic Device;Energy conservation;Joint Manipulations;Spinal Manipulations;Passive range of motion;Dry needling;Patient/family education;Orthotic Fit/Training;Stair training;Gait  training;DME Instruction;Ultrasound;Neuromuscular re-education;Scar mobilization    PT Next Visit Plan  Reassess next visit, adjust POC as indicated    PT Home Exercise Plan  01/14/20: ab set, pelvic tilts, SKTC with towel; 01/16/20: glute set    Consulted and Agree with Plan of Care  Patient       Patient will benefit from skilled therapeutic intervention in order to improve the following deficits and impairments:  Abnormal gait, Hypomobility, Decreased activity tolerance, Decreased strength, Pain, Decreased balance, Decreased mobility, Difficulty walking, Decreased range of motion, Improper body mechanics, Impaired flexibility, Postural dysfunction  Visit Diagnosis: Radiculopathy, lumbar region  Abnormal posture     Problem List Patient Active Problem List   Diagnosis Date Noted  . Lumbar spine painful on movement 12/02/2019  . Bursitis of right shoulder 10/21/2019  . Obesity (BMI 30.0-34.9) 06/17/2019  . OA (osteoarthritis) of knee 08/09/2017  . Constipation 02/05/2016  . Allergic rhinitis 10/08/2014  . GAD (generalized anxiety disorder) 09/23/2014  . Back pain with radiculopathy 04/14/2014  . POSTSURGICAL HYPOTHYROIDISM 06/22/2010  . Normocytic anemia 08/04/2009  . GERD 04/20/2009  . Hyperlipidemia LDL goal <100 05/16/2008  . Essential hypertension 05/16/2008  . OSTEOPENIA 05/16/2008  . Urinary incontinence 05/16/2008    3:58 PM, 02/06/20 Georges Lynch PT DPT  Physical Therapist with [place]  [facility]  [phone]   [facility] [address] [place], [place], [address] Phone: [phone]   Fax:   [phone]  Name: Laura [name] MRN: [id number] Date of Birth: 1951-08-08

## 2020-02-10 ENCOUNTER — Ambulatory Visit (HOSPITAL_COMMUNITY): Payer: Medicare Other | Admitting: Physical Therapy

## 2020-02-10 ENCOUNTER — Encounter (HOSPITAL_COMMUNITY): Payer: Self-pay | Admitting: Physical Therapy

## 2020-02-10 ENCOUNTER — Other Ambulatory Visit: Payer: Self-pay

## 2020-02-10 DIAGNOSIS — E559 Vitamin D deficiency, unspecified: Secondary | ICD-10-CM | POA: Diagnosis not present

## 2020-02-10 DIAGNOSIS — R293 Abnormal posture: Secondary | ICD-10-CM | POA: Diagnosis not present

## 2020-02-10 DIAGNOSIS — E785 Hyperlipidemia, unspecified: Secondary | ICD-10-CM | POA: Diagnosis not present

## 2020-02-10 DIAGNOSIS — M5416 Radiculopathy, lumbar region: Secondary | ICD-10-CM | POA: Diagnosis not present

## 2020-02-10 DIAGNOSIS — I10 Essential (primary) hypertension: Secondary | ICD-10-CM | POA: Diagnosis not present

## 2020-02-10 NOTE — Therapy (Signed)
[place], [place], [address] Phone: [phone]   Fax:  [phone]  Physical Therapy Treatment/ Progress Note   Patient Details  Name: Laura [name] MRN: [id number] Date of Birth: 1951/03/12 Referring Provider (PT): Cherly Beach NP    Encounter Date: 02/10/2020   Progress Note Reporting Period 01/14/20 to 02/10/20  See note below for Objective Data and Assessment of Progress/Goals.       PT End of Session - 02/10/20 0844    Visit Number  8    Number of Visits  11    Date for PT Re-Evaluation  02/21/20   FOTO complete visit 8   Authorization Type  [employer] Medicare, no visit limit, no auth req    Authorization Time Period  01/14/20-02/14/20; 02/10/20-02/21/20    Authorization - Visit Number  8    Authorization - Number of Visits  10    Progress Note Due on Visit  11    PT Start Time  0820    PT Stop Time  3151    PT Time Calculation (min)  35 min    Activity Tolerance  Patient tolerated treatment well    Behavior During Therapy  WFL for tasks assessed/performed       Past Medical History:  Diagnosis Date  . Anxiety   . At low risk for fall 08/15/2018  . Cystitis   . Depression   . Gastritis   . GERD (gastroesophageal reflux disease)   . Hashimoto's thyroiditis    Hx   . Hyperlipidemia   . Hypertension   . Hypothyroidism   . Normocytic anemia 2009 Hb 9.9-11.1  . Osteopenia   . Shoulder pain   . Urinary incontinence     Past Surgical History:  Procedure Laterality Date  . Bilateral tubal ligation    . BREAST EXCISIONAL BIOPSY  2010   Excisional biopsy of benign left breast mass -lopoma   . BREAST SURGERY     left nreast biopsy  . COLONOSCOPY  08/26/2011   SLF: 1. Internal hemorrhoids  . COLONOSCOPY N/A 10/06/2015   [id number] fissure or internal hemorrhoids/mild sigmoid colitis. benign colonic path   . colonscopy  2005   Dr. Tamala Julian  . ESOPHAGOGASTRODUODENOSCOPY  12/2009   chronic gastritis  .  ESOPHAGOGASTRODUODENOSCOPY N/A 05/17/2017   Dr. Oneida Alar; gastritis, normal small bowel biopsies, multiple gastric polyps. fundic gland polyps  . GIVENS CAPSULE STUDY N/A 05/30/2017   gastritis, no source for anemia identified  . left knee surgery Left March 1,2017  . PARTIAL KNEE ARTHROPLASTY Left 08/09/2017   Procedure: LEFT UNICOMPARTMENTAL KNEE;  Surgeon: Gaynelle Arabian, MD;  Location: [facility];  Service: Orthopedics;  Laterality: Left;  . Resection of left lobe of thyroid    . right carpal tunnel release    . rt. knee athroscopy  2004  . SAVORY DILATION N/A 05/17/2017   Procedure: SAVORY DILATION;  Surgeon: Danie Binder, MD;  Location: [facility];  Service: Endoscopy;  Laterality: N/A;  . TOTAL ABDOMINAL HYSTERECTOMY  1994  . UMBILICAL HERNIA REPAIR    . Urethral dilation for stenosis  2009    There were no vitals filed for this visit.  Subjective Assessment - 02/10/20 0825    Subjective  Patient says she is feeling pretty good today and is feeling much better overall. Patient says her back bothered her "just a teeny bit" over the weekend when she bent over to pick up a stick. Patient reports  no pain currently. Patient says she feels about 90% better since starting therapy. Patient does note that sitting for prolonged periods of up to 30 minutes while driving still bothers her.    Limitations  Sitting;Lifting;Standing;Walking;House hold activities    How long can you sit comfortably?  25-30 minutes    How long can you stand comfortably?  45 minutes    How long can you walk comfortably?  no problem, pain is better when moving    Diagnostic tests  xrays    Patient Stated Goals  stop pain in my back    Currently in Pain?  No/denies    Pain Onset  Efrain Sella PT Assessment - 02/10/20 0001      Assessment   Medical Diagnosis  Lumbar radiculopathy LT side     Referring Provider (PT)  Cherly Beach NP     Prior Therapy  For LT knee 20 years ago       Precautions    Precautions  None      Restrictions   Weight Bearing Restrictions  No      Home Environment   Living Environment  Private residence    Living Arrangements  Spouse/significant other      Prior Function   Level of Independence  Independent      Cognition   Overall Cognitive Status  Within Functional Limits for tasks assessed      Observation/Other Assessments   Focus on Therapeutic Outcomes (FOTO)   42% limited       Sensation   Light Touch  Appears Intact      AROM   Lumbar Flexion  25% restriciton    was 50%   Lumbar Extension  25% restriction     Lumbar - Right Side Bend  WNL    Lumbar - Left Side Bend  WNL   was 25%   Lumbar - Right Rotation  WNL    Lumbar - Left Rotation  WNL      Strength   Right Hip Flexion  5/5    Right Hip Extension  4+/5    Right Hip ABduction  4+/5    Left Hip Flexion  5/5    Left Hip Extension  4+/5   was 4   Left Hip ABduction  4+/5    Right Knee Flexion  5/5    Right Knee Extension  5/5    Left Knee Flexion  5/5    Left Knee Extension  5/5    Right Ankle Dorsiflexion  5/5    Left Ankle Dorsiflexion  5/5      Palpation   Palpation comment  Min/mod tenderness to palpation about  lower lumbar paraspinals                    [facility] Adult PT Treatment/Exercise - 02/10/20 0001      Manual Therapy   Manual Therapy  Soft tissue mobilization    Manual therapy comments  performed seperately from all other interventions    Soft tissue mobilization  IASTM using theragun to bilateral lumbar paraspinals and gluteals with patient in prone for pain and restrictions             PT Education - 02/10/20 0827    Education Details  on reassessment findings and POC    Person(s) Educated  Patient    Methods  Explanation    Comprehension  Verbalized understanding  PT Short Term Goals - 02/10/20 0841      PT SHORT TERM GOAL #1   Title  Patient will be independent with initial HEP to improve functional outcomes    Time  2     Period  Weeks    Status  Achieved    Target Date  01/31/20        PT Long Term Goals - 02/10/20 0842      PT LONG TERM GOAL #1   Title  Patient will improve FOTO score to <40% to indicate improvement in functional outcomes    Baseline  Current: 42% limited    Time  4    Period  Weeks    Status  On-going      PT LONG TERM GOAL #2   Title  Patient will report at least 75% overall improvement in subjective complaint to indicate improvement in ability to perform ADLs.    Baseline  Current: 90% improvement    Time  4    Period  Weeks    Status  Achieved      PT LONG TERM GOAL #3   Title  Patient will have equal to or > 4+/5 MMT throughout BLE to improve ability to perform functional mobility, stair ambulation and ADLs.    Baseline  See MMT    Time  4    Period  Weeks    Status  Achieved      PT LONG TERM GOAL #4   Title  Patient will be able to stand >1 hour with no increased pain to indicate improved functional outcomes and ability to wash dishes and cook dinner    Baseline  Current: able to do 45 minutes    Time  4    Period  Weeks    Status  On-going            Plan - 02/10/20 0859    Clinical Impression Statement  Patient is making good progress toward LTGs. Patient currently with 1/1 short term and 2/4 long term goals currently met. Patient still limited by ongoing muscle restriction in lumbar, postural deficits and core weakness which continue to contribute to pain and are negatively impacting functional ability. Patient will continue to benefit from skilled therapy services to address remaining deficits to reduce pain and improve LOF with ADLs and functional mobility tasks.    Examination-Activity Limitations  Bend;Squat;Sleep;Sit;Stairs;Caring for Others;Carry;Stand;Dressing;Transfers;Locomotion Level;Lift    Examination-Participation Restrictions  Community Activity;Yard Work    Stability/Clinical Decision Making  Stable/Uncomplicated    Rehab Potential  Good     PT Frequency  2x / week    PT Duration  2 weeks    PT Treatment/Interventions  ADLs/Self Care Home Management;Aquatic Therapy;Biofeedback;Cryotherapy;Electrical Stimulation;Iontophoresis 58m/ml Dexamethasone;Moist Heat;Traction;Balance training;Therapeutic exercise;Manual techniques;Vestibular;Therapeutic activities;Taping;Functional mobility training;Splinting;Vasopneumatic Device;Energy conservation;Joint Manipulations;Spinal Manipulations;Passive range of motion;Dry needling;Patient/family education;Orthotic Fit/Training;Stair training;Gait training;DME Instruction;Ultrasound;Neuromuscular re-education;Scar mobilization    PT Next Visit Plan  Continue progress core strengh and lumbar mobility as tolerated    PT Home Exercise Plan  01/14/20: ab set, pelvic tilts, SKTC with towel; 01/16/20: glute set    Consulted and Agree with Plan of Care  Patient       Patient will benefit from skilled therapeutic intervention in order to improve the following deficits and impairments:  Abnormal gait, Hypomobility, Decreased activity tolerance, Decreased strength, Pain, Decreased balance, Decreased mobility, Difficulty walking, Decreased range of motion, Improper body mechanics, Impaired flexibility, Postural dysfunction  Visit Diagnosis: Radiculopathy, lumbar region  Abnormal posture  Problem List Patient Active Problem List   Diagnosis Date Noted  . Lumbar spine painful on movement 12/02/2019  . Bursitis of right shoulder 10/21/2019  . Obesity (BMI 30.0-34.9) 06/17/2019  . OA (osteoarthritis) of knee 08/09/2017  . Constipation 02/05/2016  . Allergic rhinitis 10/08/2014  . GAD (generalized anxiety disorder) 09/23/2014  . Back pain with radiculopathy 04/14/2014  . POSTSURGICAL HYPOTHYROIDISM 06/22/2010  . Normocytic anemia 08/04/2009  . GERD 04/20/2009  . Hyperlipidemia LDL goal <100 05/16/2008  . Essential hypertension 05/16/2008  . OSTEOPENIA 05/16/2008  . Urinary incontinence 05/16/2008    9:03 AM, 02/10/20 Josue Hector PT DPT  Physical Therapist with [facility]  [phone] [place] [address] [place], [place], [address] Phone: [phone]   Fax:  [phone]  Name: Laura [name] MRN: [id number] Date of Birth: October 29, 1951

## 2020-02-11 LAB — LIPID PANEL
Cholesterol: 224 mg/dL — ABNORMAL HIGH (ref <200)
HDL: 60 mg/dL (ref >=50)
LDL Cholesterol (Calc): 143 mg/dL (calc) — ABNORMAL HIGH
Non-HDL Cholesterol (Calc): 164 mg/dL (calc) — ABNORMAL HIGH (ref <130)
Total CHOL/HDL Ratio: 3.7 (calc) (ref <5.0)
Triglycerides: 99 mg/dL (ref <150)

## 2020-02-11 LAB — COMPLETE METABOLIC PANEL WITH GFR
AG Ratio: 1.5 (calc) (ref 1.0–2.5)
ALT: 16 U/L (ref 6–29)
AST: 23 U/L (ref 10–35)
Albumin: 4 g/dL (ref 3.6–5.1)
Alkaline phosphatase (APISO): 63 U/L (ref 37–153)
BUN: 12 mg/dL (ref 7–25)
CO2: 30 mmol/L (ref 20–32)
Calcium: 9.8 mg/dL (ref 8.6–10.4)
Chloride: 103 mmol/L (ref 98–110)
Creat: 0.91 mg/dL (ref 0.50–0.99)
GFR, Est African American: 75 mL/min/{1.73_m2} (ref >=60)
GFR, Est Non African American: 64 mL/min/{1.73_m2} (ref >=60)
Globulin: 2.6 g/dL (calc) (ref 1.9–3.7)
Glucose, Bld: 99 mg/dL (ref 65–99)
Potassium: 3.4 mmol/L — ABNORMAL LOW (ref 3.5–5.3)
Sodium: 141 mmol/L (ref 135–146)
Total Bilirubin: 0.4 mg/dL (ref 0.2–1.2)
Total Protein: 6.6 g/dL (ref 6.1–8.1)

## 2020-02-11 LAB — VITAMIN D 25 HYDROXY (VIT D DEFICIENCY, FRACTURES): Vit D, 25-Hydroxy: 60 ng/mL (ref 30–100)

## 2020-02-12 ENCOUNTER — Encounter (HOSPITAL_COMMUNITY): Payer: Medicare Other

## 2020-02-12 ENCOUNTER — Ambulatory Visit (HOSPITAL_COMMUNITY): Payer: Medicare Other | Admitting: Physical Therapy

## 2020-02-13 ENCOUNTER — Encounter (HOSPITAL_COMMUNITY): Payer: Self-pay | Admitting: Physical Therapy

## 2020-02-13 ENCOUNTER — Ambulatory Visit (HOSPITAL_COMMUNITY): Payer: Medicare Other | Admitting: Physical Therapy

## 2020-02-13 ENCOUNTER — Other Ambulatory Visit: Payer: Self-pay

## 2020-02-13 DIAGNOSIS — M5416 Radiculopathy, lumbar region: Secondary | ICD-10-CM | POA: Diagnosis not present

## 2020-02-13 DIAGNOSIS — R293 Abnormal posture: Secondary | ICD-10-CM | POA: Diagnosis not present

## 2020-02-13 NOTE — Therapy (Signed)
[place], [place], [address] Phone: [phone]   Fax:  [phone]  Physical Therapy Treatment  Patient Details  Name: Laura [name] MRN: [id number] Date of Birth: 26-Oct-1951 Referring Provider (PT): Cherly Beach NP    Encounter Date: 02/13/2020  PT End of Session - 02/13/20 1514    Visit Number  9    Number of Visits  11    Date for PT Re-Evaluation  02/21/20   FOTO complete visit [employer], no visit limit, no auth req    Authorization - Visit Number  1    Authorization - Number of Visits  10    Progress Note Due on Visit  11    PT Start Time  1400    PT Stop Time  1455    PT Time Calculation (min)  55 min    Activity Tolerance  Patient tolerated treatment well    Behavior During Therapy  [facility] for tasks assessed/performed       Past Medical History:  Diagnosis Date  . Anxiety   . At low risk for fall 08/15/2018  . Cystitis   . Depression   . Gastritis   . GERD (gastroesophageal reflux disease)   . Hashimoto's thyroiditis    Hx   . Hyperlipidemia   . Hypertension   . Hypothyroidism   . Normocytic anemia 2009 Hb 9.9-11.1  . Osteopenia   . Shoulder pain   . Urinary incontinence     Past Surgical History:  Procedure Laterality Date  . Bilateral tubal ligation    . BREAST EXCISIONAL BIOPSY  2010   Excisional biopsy of benign left breast mass -lopoma   . BREAST SURGERY     left nreast biopsy  . COLONOSCOPY  08/26/2011   SLF: 1. Internal hemorrhoids  . COLONOSCOPY N/A 10/06/2015   [id number] fissure or internal hemorrhoids/mild sigmoid colitis. benign colonic path   . colonscopy  2005   Dr. Tamala Julian  . ESOPHAGOGASTRODUODENOSCOPY  12/2009   chronic gastritis  . ESOPHAGOGASTRODUODENOSCOPY N/A 05/17/2017   Dr. Oneida Alar; gastritis, normal small bowel biopsies, multiple gastric polyps. fundic gland polyps  . GIVENS CAPSULE STUDY N/A 05/30/2017   gastritis, no source for anemia  identified  . left knee surgery Left March 1,2017  . PARTIAL KNEE ARTHROPLASTY Left 08/09/2017   Procedure: LEFT UNICOMPARTMENTAL KNEE;  Surgeon: Gaynelle Arabian, MD;  Location: [facility];  Service: Orthopedics;  Laterality: Left;  . Resection of left lobe of thyroid    . right carpal tunnel release    . rt. knee athroscopy  2004  . SAVORY DILATION N/A 05/17/2017   Procedure: SAVORY DILATION;  Surgeon: Danie Binder, MD;  [facility];  Service: Endoscopy;  Laterality: N/A;  . TOTAL ABDOMINAL HYSTERECTOMY  1994  . UMBILICAL HERNIA REPAIR    . Urethral dilation for stenosis  2009    There were no vitals filed for this visit.  Subjective Assessment - 02/13/20 1520    Subjective  Patient reports she woke up with a stitch in her RT side of low back this morning, not sure why. Patient says she did her stretching which improved it some, and notes that it is not bothering her currently, but felt it earlier today and this morning.    Limitations  Sitting;Lifting;Standing;Walking;House hold activities    How long can you sit comfortably?  25-30 minutes    How long can you stand  comfortably?  45 minutes    How long can you walk comfortably?  no problem, pain is better when moving    Diagnostic tests  xrays    Patient Stated Goals  stop pain in my back    Currently in Pain?  No/denies    Pain Onset  Yesterday                   Adult Aquatic Therapy - 02/13/20 1522      Treatment   Gait  dynamic warmup: walking, high knee march, straight leg march, sidestepping 30' x 2RT each     Exercises  SLS 3 x10" each, squats with DB reach x20, noodle push/pull x20, noodle twist x20, noodle twist with chops x20, paddle twists x20, heel raises x20, noodle leg press x20 each, single DB alternating press down x10 each, double DB press down x10, standing hip abduction with 1# x20 each                    PT Short Term Goals - 02/10/20 0841      PT SHORT TERM GOAL #1   Title   Patient will be independent with initial HEP to improve functional outcomes    Time  2    Period  Weeks    Status  Achieved    Target Date  01/31/20        PT Long Term Goals - 02/10/20 7564      PT LONG TERM GOAL #1   Title  Patient will improve FOTO score to <40% to indicate improvement in functional outcomes    Baseline  Current: 42% limited    Time  4    Period  Weeks    Status  On-going      PT LONG TERM GOAL #2   Title  Patient will report at least 75% overall improvement in subjective complaint to indicate improvement in ability to perform ADLs.    Baseline  Current: 90% improvement    Time  4    Period  Weeks    Status  Achieved      PT LONG TERM GOAL #3   Title  Patient will have equal to or > 4+/5 MMT throughout BLE to improve ability to perform functional mobility, stair ambulation and ADLs.    Baseline  See MMT    Time  4    Period  Weeks    Status  Achieved      PT LONG TERM GOAL #4   Title  Patient will be able to stand >1 hour with no increased pain to indicate improved functional outcomes and ability to wash dishes and cook dinner    Baseline  Current: able to do 45 minutes    Time  4    Period  Weeks    Status  On-going            Plan - 02/13/20 1515    Clinical Impression Statement  Patient tolerated session well today with no increased complaint of pain. Patient continues to demo postural deficits and requires frequent cues for maintaing neutral lumbar spine and keeping abdominals engaged. Patient noted slight pulling in low lumbar with squatting with dumbell reach. Patient cued to keep torso upright and engage core and gluteals which resolved complaint. Added ankle weights to hip abduction. Added SLS in pool for improved balance and proprioception.    Examination-Activity Limitations  Bend;Squat;Sleep;Sit;Stairs;Caring for Others;Carry;Stand;Dressing;Transfers;Locomotion Level;Lift    Examination-Participation Restrictions  Community  Activity;Yard Work    Stability/Clinical Decision Making  Stable/Uncomplicated    Rehab Potential  Good    PT Frequency  2x / week    PT Duration  2 weeks    PT Treatment/Interventions  ADLs/Self Care Home Management;Aquatic Therapy;Biofeedback;Cryotherapy;Electrical Stimulation;Iontophoresis 4mg /ml Dexamethasone;Moist Heat;Traction;Balance training;Therapeutic exercise;Manual techniques;Vestibular;Therapeutic activities;Taping;Functional mobility training;Splinting;Vasopneumatic Device;Energy conservation;Joint Manipulations;Spinal Manipulations;Passive range of motion;Dry needling;Patient/family education;Orthotic Fit/Training;Stair training;Gait training;DME Instruction;Ultrasound;Neuromuscular re-education;Scar mobilization    PT Next Visit Plan  Continue progress core strengh and lumbar mobility as tolerated    PT Home Exercise Plan  01/14/20: ab set, pelvic tilts, SKTC with towel; 01/16/20: glute set    Consulted and Agree with Plan of Care  Patient       Patient will benefit from skilled therapeutic intervention in order to improve the following deficits and impairments:  Abnormal gait, Hypomobility, Decreased activity tolerance, Decreased strength, Pain, Decreased balance, Decreased mobility, Difficulty walking, Decreased range of motion, Improper body mechanics, Impaired flexibility, Postural dysfunction  Visit Diagnosis: Radiculopathy, lumbar region  Abnormal posture     Problem List Patient Active Problem List   Diagnosis Date Noted  . Lumbar spine painful on movement 12/02/2019  . Bursitis of right shoulder 10/21/2019  . Obesity (BMI 30.0-34.9) 06/17/2019  . OA (osteoarthritis) of knee 08/09/2017  . Constipation 02/05/2016  . Allergic rhinitis 10/08/2014  . GAD (generalized anxiety disorder) 09/23/2014  . Back pain with radiculopathy 04/14/2014  . POSTSURGICAL HYPOTHYROIDISM 06/22/2010  . Normocytic anemia 08/04/2009  . GERD 04/20/2009  . Hyperlipidemia LDL goal <100  05/16/2008  . Essential hypertension 05/16/2008  . OSTEOPENIA 05/16/2008  . Urinary incontinence 05/16/2008    3:26 PM, 02/13/20 02/15/20 PT DPT  Physical Therapist with [place]  [facility]  [phone]   [facility] [address] [place], [place], [place] Phone: [phone]   Fax:  [phone]  Name: Laura [name] MRN: [name] Date of Birth: August 07, 1951

## 2020-02-18 ENCOUNTER — Other Ambulatory Visit: Payer: Self-pay

## 2020-02-18 ENCOUNTER — Ambulatory Visit (HOSPITAL_COMMUNITY): Payer: Medicare Other | Admitting: Physical Therapy

## 2020-02-18 ENCOUNTER — Encounter (HOSPITAL_COMMUNITY): Payer: Self-pay | Admitting: Physical Therapy

## 2020-02-18 DIAGNOSIS — R293 Abnormal posture: Secondary | ICD-10-CM

## 2020-02-18 DIAGNOSIS — M5416 Radiculopathy, lumbar region: Secondary | ICD-10-CM | POA: Diagnosis not present

## 2020-02-18 NOTE — Therapy (Signed)
[facility] [address] [place], [place], [address] Phone: [phone]   Fax:  [phone]  Physical Therapy Treatment  Patient Details  Name: Laura [name] MRN: [id number] Date of Birth: 02-15-1951 Referring Provider (PT): Tereasa Coop NP    Encounter Date: 02/18/2020  PT End of Session - 02/18/20 6962    Visit Number  10    Number of Visits  11    Date for PT Re-Evaluation  02/21/20   FOTO complete visit 8   Authorization Type  [employer] Medicare, no visit limit, no auth req    Authorization - Visit Number  2    Authorization - Number of Visits  10    Progress Note Due on Visit  11    PT Start Time  [phone]    PT Stop Time  0855    PT Time Calculation (min)  38 min    Activity Tolerance  Patient tolerated treatment well    Behavior During Therapy  [facility] for tasks assessed/performed       Past Medical History:  Diagnosis Date  . Anxiety   . At low risk for fall 08/15/2018  . Cystitis   . Depression   . Gastritis   . GERD (gastroesophageal reflux disease)   . Hashimoto's thyroiditis    Hx   . Hyperlipidemia   . Hypertension   . Hypothyroidism   . Normocytic anemia 2009 Hb 9.9-11.1  . Osteopenia   . Shoulder pain   . Urinary incontinence     Past Surgical History:  Procedure Laterality Date  . Bilateral tubal ligation    . BREAST EXCISIONAL BIOPSY  2010   Excisional biopsy of benign left breast mass -lopoma   . BREAST SURGERY     left nreast biopsy  . COLONOSCOPY  08/26/2011   SLF: 1. Internal hemorrhoids  . COLONOSCOPY N/A 10/06/2015   UXL:KGMW fissure or internal hemorrhoids/mild sigmoid colitis. benign colonic path   . colonscopy  2005   Dr. Katrinka Blazing  . ESOPHAGOGASTRODUODENOSCOPY  12/2009   chronic gastritis  . ESOPHAGOGASTRODUODENOSCOPY N/A 05/17/2017   Dr. Darrick Penna; gastritis, normal small bowel biopsies, multiple gastric polyps. fundic gland polyps  . GIVENS CAPSULE STUDY N/A 05/30/2017   gastritis, no source for anemia  identified  . left knee surgery Left March 1,2017  . PARTIAL KNEE ARTHROPLASTY Left 08/09/2017   Procedure: LEFT UNICOMPARTMENTAL KNEE;  Surgeon: Ollen Gross, MD;  Location: [facility];  Service: Orthopedics;  Laterality: Left;  . Resection of left lobe of thyroid    . right carpal tunnel release    . rt. knee athroscopy  2004  . SAVORY DILATION N/A 05/17/2017   Procedure: SAVORY DILATION;  Surgeon: West Bali, MD;  [facility];  Service: Endoscopy;  Laterality: N/A;  . TOTAL ABDOMINAL HYSTERECTOMY  1994  . UMBILICAL HERNIA REPAIR    . Urethral dilation for stenosis  2009    There were no vitals filed for this visit.  Subjective Assessment - 02/18/20 0821    Subjective  Patient says she is doing well today, says she feels aqua therapy has been helpful. Says she is able to bend now without pain and was able to dust the bottom of her cabinets yesterday, which she could not do before.    Limitations  Sitting;Lifting;Standing;Walking;House hold activities    How long can you sit comfortably?  25-30 minutes    How long can you stand comfortably?  45 minutes  How long can you walk comfortably?  no problem, pain is better when moving    Diagnostic tests  xrays    Patient Stated Goals  stop pain in my back    Currently in Pain?  No/denies    Pain Onset  Yesterday         [facility] PT Assessment - 02/18/20 0001      Observation/Other Assessments   Focus on Therapeutic Outcomes (FOTO)   17% limited    was 42%                  [facility] Adult PT Treatment/Exercise - 02/18/20 0001      Lumbar Exercises: Stretches   Piriformis Stretch  Right;Left;3 reps;30 seconds      Lumbar Exercises: Standing   Functional Squats  15 reps    Other Standing Lumbar Exercises  standing hip abduction and extension x15 each with GTB      Lumbar Exercises: Supine   Bent Knee Raise  20 reps    Dead Bug  10 reps    Bridge  10 reps;5 seconds             PT Education -  02/18/20 0855    Education Details  on transition to IND with HEP and updated HEP handout    Person(s) Educated  Patient    Methods  Explanation;Handout    Comprehension  Verbalized understanding       PT Short Term Goals - 02/10/20 0841      PT SHORT TERM GOAL #1   Title  Patient will be independent with initial HEP to improve functional outcomes    Time  2    Period  Weeks    Status  Achieved    Target Date  01/31/20        PT Long Term Goals - 02/10/20 8325      PT LONG TERM GOAL #1   Title  Patient will improve FOTO score to <40% to indicate improvement in functional outcomes    Baseline  Current: 42% limited    Time  4    Period  Weeks    Status  On-going      PT LONG TERM GOAL #2   Title  Patient will report at least 75% overall improvement in subjective complaint to indicate improvement in ability to perform ADLs.    Baseline  Current: 90% improvement    Time  4    Period  Weeks    Status  Achieved      PT LONG TERM GOAL #3   Title  Patient will have equal to or > 4+/5 MMT throughout BLE to improve ability to perform functional mobility, stair ambulation and ADLs.    Baseline  See MMT    Time  4    Period  Weeks    Status  Achieved      PT LONG TERM GOAL #4   Title  Patient will be able to stand >1 hour with no increased pain to indicate improved functional outcomes and ability to wash dishes and cook dinner    Baseline  Current: able to do 45 minutes    Time  4    Period  Weeks    Status  On-going            Plan - 02/18/20 4982    Clinical Impression Statement  Patient tolerated session well today with no increased complaint of pain. Progress table core exercises and reviewed  comprehensive HEP. Patient cued on form with chair squats and to avoid LE ER with standing hip abduction and extension. Patient educated on and issued updated HEP handout. Patient to be seen 1 visit remaining in aquatic therapy to address any aquatic HEP questions and discuss  possibility of enrolling in water aerobics.    Examination-Activity Limitations  Bend;Squat;Sleep;Sit;Stairs;Caring for Others;Carry;Stand;Dressing;Transfers;Locomotion Level;Lift    Examination-Participation Restrictions  Community Activity;Yard Work    Stability/Clinical Decision Making  Stable/Uncomplicated    Rehab Potential  Good    PT Frequency  2x / week    PT Duration  2 weeks    PT Treatment/Interventions  ADLs/Self Care Home Management;Aquatic Therapy;Biofeedback;Cryotherapy;Electrical Stimulation;Iontophoresis 4mg /ml Dexamethasone;Moist Heat;Traction;Balance training;Therapeutic exercise;Manual techniques;Vestibular;Therapeutic activities;Taping;Functional mobility training;Splinting;Vasopneumatic Device;Energy conservation;Joint Manipulations;Spinal Manipulations;Passive range of motion;Dry needling;Patient/family education;Orthotic Fit/Training;Stair training;Gait training;DME Instruction;Ultrasound;Neuromuscular re-education;Scar mobilization    PT Next Visit Plan  Review aquatic exercise and HEP. DC to home exercise program next visit    PT Home Exercise Plan  01/14/20: ab set, pelvic tilts, SKTC with towel; 01/16/20: glute set    Consulted and Agree with Plan of Care  Patient       Patient will benefit from skilled therapeutic intervention in order to improve the following deficits and impairments:  Abnormal gait, Hypomobility, Decreased activity tolerance, Decreased strength, Pain, Decreased balance, Decreased mobility, Difficulty walking, Decreased range of motion, Improper body mechanics, Impaired flexibility, Postural dysfunction  Visit Diagnosis: Radiculopathy, lumbar region  Abnormal posture     Problem List Patient Active Problem List   Diagnosis Date Noted  . Lumbar spine painful on movement 12/02/2019  . Bursitis of right shoulder 10/21/2019  . Obesity (BMI 30.0-34.9) 06/17/2019  . OA (osteoarthritis) of knee 08/09/2017  . Constipation 02/05/2016  . Allergic  rhinitis 10/08/2014  . GAD (generalized anxiety disorder) 09/23/2014  . Back pain with radiculopathy 04/14/2014  . POSTSURGICAL HYPOTHYROIDISM 06/22/2010  . Normocytic anemia 08/04/2009  . GERD 04/20/2009  . Hyperlipidemia LDL goal <100 05/16/2008  . Essential hypertension 05/16/2008  . OSTEOPENIA 05/16/2008  . Urinary incontinence 05/16/2008   8:59 AM, 02/18/20 02/20/20 PT DPT  Physical Therapist with [facility]  [facility]  [phone]   [facility] [address] [place], [place], [place] Phone: [phone]   Fax:  [phone]  Name: Laura [name] MRN: [name] Date of Birth: 10/27/1951

## 2020-02-18 NOTE — Patient Instructions (Signed)
Access Code: [email or web address] URL: [email or web address] Date: 02/18/2020 Prepared by: Georges Lynch  Exercises Supine Transversus Abdominis Bracing - Hands on Stomach - 1 x daily - 5 x weekly - 2 sets - 10 reps - 5 hold Supine March - 1 x daily - 5 x weekly - 2 sets - 10 reps Dead Bug - 1 x daily - 5 x weekly - 2 sets - 10 reps Supine Bridge - 1 x daily - 5 x weekly - 2 sets - 10 reps - 5 hold Supine Piriformis Stretch with Foot on Ground - 1 x daily - 5 x weekly - 1 sets - 3 reps - 30 hold Mini Squat with Chair - 1 x daily - 3 x weekly - 2 sets - 10 reps Hip Abduction with Resistance Loop - 1 x daily - 3 x weekly - 2 sets - 10 reps Hip Extension with Resistance Loop - 1 x daily - 3 x weekly - 2 sets - 10 reps

## 2020-02-19 ENCOUNTER — Ambulatory Visit: Payer: Medicare Other | Admitting: Family Medicine

## 2020-02-19 ENCOUNTER — Ambulatory Visit (INDEPENDENT_AMBULATORY_CARE_PROVIDER_SITE_OTHER): Payer: Medicare Other | Admitting: Family Medicine

## 2020-02-19 ENCOUNTER — Encounter (HOSPITAL_COMMUNITY): Payer: Self-pay | Admitting: Physical Therapy

## 2020-02-19 ENCOUNTER — Encounter: Payer: Self-pay | Admitting: Family Medicine

## 2020-02-19 ENCOUNTER — Ambulatory Visit (HOSPITAL_COMMUNITY): Payer: Medicare Other | Admitting: Physical Therapy

## 2020-02-19 VITALS — BP 140/84 | HR 83 | Temp 97.8°F | Resp 15 | Ht 64.0 in | Wt 183.0 lb

## 2020-02-19 DIAGNOSIS — R293 Abnormal posture: Secondary | ICD-10-CM

## 2020-02-19 DIAGNOSIS — M5416 Radiculopathy, lumbar region: Secondary | ICD-10-CM

## 2020-02-19 DIAGNOSIS — M5410 Radiculopathy, site unspecified: Secondary | ICD-10-CM | POA: Diagnosis not present

## 2020-02-19 DIAGNOSIS — I10 Essential (primary) hypertension: Secondary | ICD-10-CM | POA: Diagnosis not present

## 2020-02-19 MED ORDER — METHYLPREDNISOLONE ACETATE 80 MG/ML IJ SUSP
80.0000 mg | Freq: Once | INTRAMUSCULAR | Status: AC
  Administered 2020-02-19: 80 mg via INTRAMUSCULAR

## 2020-02-19 MED ORDER — KETOROLAC TROMETHAMINE 60 MG/2ML IM SOLN
60.0000 mg | Freq: Once | INTRAMUSCULAR | Status: AC
  Administered 2020-02-19: 60 mg via INTRAMUSCULAR

## 2020-02-19 NOTE — Therapy (Signed)
[facility] [address] [place], [place], [address] Phone: [phone]   Fax:  [phone]  Physical Therapy Treatment/ Discharge Summary  Patient Details  Name: Laura [name] MRN: [id number] Date of Birth: 1950-11-25 Referring Provider (PT): Cherly Beach NP   PHYSICAL THERAPY DISCHARGE SUMMARY  Visits from Start of Care: 11  Current functional level related to goals / functional outcomes: See below   Remaining deficits: See below   Education / Equipment: See assessment Plan: Patient agrees to discharge.  Patient goals were met. Patient is being discharged due to meeting the stated rehab goals.  ?????       Encounter Date: 02/19/2020  PT End of Session - 02/19/20 1655    Visit Number  11    Number of Visits  11    Date for PT Re-Evaluation  02/21/20   FOTO complete visit 8   Authorization Type  [employer] Medicare, no visit limit, no auth req    Authorization - Visit Number  3    Authorization - Number of Visits  10    Progress Note Due on Visit  11    PT Start Time  7680    PT Stop Time  1355    PT Time Calculation (min)  50 min    Activity Tolerance  Patient tolerated treatment well    Behavior During Therapy  WFL for tasks assessed/performed       Past Medical History:  Diagnosis Date  . Anxiety   . At low risk for fall 08/15/2018  . Cystitis   . Depression   . Gastritis   . GERD (gastroesophageal reflux disease)   . Hashimoto's thyroiditis    Hx   . Hyperlipidemia   . Hypertension   . Hypothyroidism   . Normocytic anemia 2009 Hb 9.9-11.1  . Osteopenia   . Shoulder pain   . Urinary incontinence     Past Surgical History:  Procedure Laterality Date  . Bilateral tubal ligation    . BREAST EXCISIONAL BIOPSY  2010   Excisional biopsy of benign left breast mass -lopoma   . BREAST SURGERY     left nreast biopsy  . COLONOSCOPY  08/26/2011   SLF: 1. Internal hemorrhoids  . COLONOSCOPY N/A 10/06/2015    SUP:JSRP fissure or internal hemorrhoids/mild sigmoid colitis. benign colonic path   . colonscopy  2005   Dr. Tamala Julian  . ESOPHAGOGASTRODUODENOSCOPY  12/2009   chronic gastritis  . ESOPHAGOGASTRODUODENOSCOPY N/A 05/17/2017   Dr. Oneida Alar; gastritis, normal small bowel biopsies, multiple gastric polyps. fundic gland polyps  . GIVENS CAPSULE STUDY N/A 05/30/2017   gastritis, no source for anemia identified  . left knee surgery Left March 1,2017  . PARTIAL KNEE ARTHROPLASTY Left 08/09/2017   Procedure: LEFT UNICOMPARTMENTAL KNEE;  Surgeon: Gaynelle Arabian, MD;  Location: [facility];  Service: Orthopedics;  Laterality: Left;  . Resection of left lobe of thyroid    . right carpal tunnel release    . rt. knee athroscopy  2004  . SAVORY DILATION N/A 05/17/2017   Procedure: SAVORY DILATION;  Surgeon: Danie Binder, MD;  [facility];  Service: Endoscopy;  Laterality: N/A;  . TOTAL ABDOMINAL HYSTERECTOMY  1994  . UMBILICAL HERNIA REPAIR    . Urethral dilation for stenosis  2009    There were no vitals filed for this visit.  Subjective Assessment - 02/19/20 1652    Subjective  Patient says she is doing very well, feeling good  and has no pain currenlty. Says she was able to practice and has reviewed her issued land HEP since yesterday and has no questions. Says she feels 100% improvement since starting therapy and that aquatic therapy has helped alot.    Limitations  Sitting;Lifting;Standing;Walking;House hold activities    How long can you sit comfortably?  25-30 minutes    How long can you stand comfortably?  45 minutes    How long can you walk comfortably?  no problem, pain is better when moving    Diagnostic tests  xrays    Patient Stated Goals  stop pain in my back    Currently in Pain?  No/denies    Pain Onset  [facility] Sella PT Assessment - 02/19/20 0001      Assessment   Medical Diagnosis  Lumbar radiculopathy LT side     Referring Provider (PT)  Cherly Beach NP      Prior Therapy  For LT knee 20 years ago       Precautions   Precautions  None      Restrictions   Weight Bearing Restrictions  No      Home Environment   Living Environment  Private residence    Living Arrangements  Spouse/significant other      Prior Function   Level of Independence  Independent      Cognition   Overall Cognitive Status  Within Functional Limits for tasks assessed      Observation/Other Assessments   Focus on Therapeutic Outcomes (FOTO)   17% limited                Adult Aquatic Therapy - 02/19/20 1659      Treatment   Gait  dynamic warmup: walking, high knee march, straight leg march, sidestepping, retro walking 30' x 2RT each     Exercises  squats with DB reach x20, paddle push/pull x20, paddle twist x20, heel raises x20, noodle leg press x20 each, single DB alternating press down x10 each, double DB press down x10, standing hip abduction with 1# x20 each , standing hip extension with 1# weight x 20 each                 PT Education - 02/19/20 1653    Education Details  On goals review, DC, and transition to water aerobics class for continued benefit    Person(s) Educated  Patient    Methods  Explanation    Comprehension  Verbalized understanding       PT Short Term Goals - 02/10/20 0841      PT SHORT TERM GOAL #1   Title  Patient will be independent with initial HEP to improve functional outcomes    Time  2    Period  Weeks    Status  Achieved    Target Date  01/31/20        PT Long Term Goals - 02/19/20 1656      PT LONG TERM GOAL #1   Title  Patient will improve FOTO score to <40% to indicate improvement in functional outcomes    Baseline  Current: 17% limited    Time  4    Period  Weeks    Status  Achieved      PT LONG TERM GOAL #2   Title  Patient will report at least 75% overall improvement in subjective complaint to indicate improvement in ability to perform ADLs.  Baseline  Current: 100% improvement    Time   4    Period  Weeks    Status  Achieved      PT LONG TERM GOAL #3   Title  Patient will have equal to or > 4+/5 MMT throughout BLE to improve ability to perform functional mobility, stair ambulation and ADLs.    Baseline  See MMT    Time  4    Period  Weeks    Status  Achieved      PT LONG TERM GOAL #4   Title  Patient will be able to stand >1 hour with no increased pain to indicate improved functional outcomes and ability to wash dishes and cook dinner    Baseline  Able to do >1 hour    Time  4    Period  Weeks    Status  Achieved            Plan - 02/19/20 1701    Clinical Impression Statement  Patient has made very good progress in therapy and has currently met all therapy goals. Patient demos significant improvement in functional mobility, tolerance to functional activity and reports zero low back pain. Patient educated on aquatic exercises and transition to DC, and possibly starting water aerobics class to maintain current progress and mobility. Patient DC today, and instructed to follow up with physical therapy services with any further questions or concerns.    Examination-Activity Limitations  Bend;Squat;Sleep;Sit;Stairs;Caring for Others;Carry;Stand;Dressing;Transfers;Locomotion Level;Lift    Examination-Participation Restrictions  Community Activity;Yard Work    Stability/Clinical Decision Making  Stable/Uncomplicated    Rehab Potential  Good    PT Treatment/Interventions  ADLs/Self Care Home Management;Aquatic Therapy;Biofeedback;Cryotherapy;Electrical Stimulation;Iontophoresis 79m/ml Dexamethasone;Moist Heat;Traction;Balance training;Therapeutic exercise;Manual techniques;Vestibular;Therapeutic activities;Taping;Functional mobility training;Splinting;Vasopneumatic Device;Energy conservation;Joint Manipulations;Spinal Manipulations;Passive range of motion;Dry needling;Patient/family education;Orthotic Fit/Training;Stair training;Gait training;DME  Instruction;Ultrasound;Neuromuscular re-education;Scar mobilization    PT Next Visit Plan  DC    PT Home Exercise Plan  01/14/20: ab set, pelvic tilts, SKTC with towel; 01/16/20: glute set    Consulted and Agree with Plan of Care  Patient       Patient will benefit from skilled therapeutic intervention in order to improve the following deficits and impairments:  Abnormal gait, Hypomobility, Decreased activity tolerance, Decreased strength, Pain, Decreased balance, Decreased mobility, Difficulty walking, Decreased range of motion, Improper body mechanics, Impaired flexibility, Postural dysfunction  Visit Diagnosis: Radiculopathy, lumbar region  Abnormal posture     Problem List Patient Active Problem List   Diagnosis Date Noted  . Lumbar spine painful on movement 12/02/2019  . Bursitis of right shoulder 10/21/2019  . Obesity (BMI 30.0-34.9) 06/17/2019  . OA (osteoarthritis) of knee 08/09/2017  . Constipation 02/05/2016  . Allergic rhinitis 10/08/2014  . GAD (generalized anxiety disorder) 09/23/2014  . Back pain with radiculopathy 04/14/2014  . POSTSURGICAL HYPOTHYROIDISM 06/22/2010  . Normocytic anemia 08/04/2009  . GERD 04/20/2009  . Hyperlipidemia LDL goal <100 05/16/2008  . Essential hypertension 05/16/2008  . OSTEOPENIA 05/16/2008  . Urinary incontinence 05/16/2008   5:07 PM, 02/19/20 CJosue HectorPT DPT  [facility] [phone] [place] [address]Phone: [phone]  Fax:  [phone] Name: Laura CANNYMRN: [id number] of Birth: 9Jan 16, 1952

## 2020-02-19 NOTE — Patient Instructions (Addendum)
I appreciate the opportunity to provide you with care for your health and wellness. Today we discussed: on going back pain  Follow up: 3 months   No labs   Referral to Ortho in [facility]  MRI ordered today   Injections today to help with pain. Continue PT stretches and exercises  Please continue to practice social distancing to keep you, your family, and our community safe.  If you must go out, please wear a mask and practice good handwashing.  It was a pleasure to see you and I look forward to continuing to work together on your health and well-being. Please do not hesitate to call the office if you need care or have questions about your care.  Have a wonderful day and week. With Gratitude, Tereasa Coop, DNP, AGNP-BC

## 2020-02-19 NOTE — Assessment & Plan Note (Signed)
Laura [name] is encouraged to maintain a well balanced diet that is low in salt. Controlled, continue current medication regimen.   Additionally, she is also reminded that exercise is beneficial for heart health and control of  Blood pressure. 30-60 minutes daily is recommended-walking was suggested.

## 2020-02-19 NOTE — Assessment & Plan Note (Signed)
PT ordered, helped some, but not enough per her. Referral to Ortho today Injections in office. Xrays-normal-given patten of pain, might have changes in spine some where about L1-4 MRI ordered today as well.  Reviewed side effects, risks and benefits of medication.   Patient acknowledged agreement and understanding of the plan.

## 2020-02-19 NOTE — Progress Notes (Signed)
Subjective:  Patient ID: Laura [name], female    DOB: [birth year]  Age: 69 y.o. MRN: [id number]  CC:  Chief Complaint  Patient presents with  . Back Pain    lower back pain, no numbness or tingling in her leg       HPI  HPI  Laura [name] is a 69 year old female patient who presents back today for lower back pain that travels into the left lateral aspect of her leg.  She denies have any numbness or tingling or changes in possible cauda equina syndrome signs or symptoms.  She reports that it is described as achy and sharp.  Sitting for long periods of time and standing for long periods of time sometimes she does not have to do anything and it will start aching.  She has been doing physical therapy it seems to help some but it ends too soon and then she is back hurting again.  It is intermittent in nature and a 6 out of 10 pain scale.  Is willing to get injections today to help with inflammation and pain.  As discussed before would like to limit how many prednisone Dosepaks and/or prednisone injections she gets secondary to bone health.  She has tried physical therapy so now working do referral to Ortho and MRI.  X-rays were negative for any significant changes.   Today patient denies signs and symptoms of COVID 19 infection including fever, chills, cough, shortness of breath, and headache. Past Medical, Surgical, Social History, Allergies, and Medications have been Reviewed.   Past Medical History:  Diagnosis Date  . Anxiety   . At low risk for fall 08/15/2018  . Cystitis   . Depression   . Gastritis   . GERD (gastroesophageal reflux disease)   . Hashimoto's thyroiditis    Hx   . Hyperlipidemia   . Hypertension   . Hypothyroidism   . Normocytic anemia 2009 Hb 9.9-11.1  . Osteopenia   . Shoulder pain   . Urinary incontinence     No outpatient medications have been marked as taking for the 02/19/20 encounter (Office Visit) with Perlie Mayo, NP.    ROS:  Review of  Systems  Constitutional: Negative.   HENT: Negative.   Eyes: Negative.   Respiratory: Negative.   Cardiovascular: Negative.   Gastrointestinal: Negative.   Genitourinary: Negative.   Musculoskeletal: Positive for back pain.  Skin: Negative.   Neurological: Negative.   Endo/Heme/Allergies: Negative.   Psychiatric/Behavioral: Negative.   All other systems reviewed and are negative.    Objective:   Today's Vitals: BP 140/84   Pulse 83   Temp 97.8 F (36.6 C) (Temporal)   Resp 15   Ht 5\' 4"  (1.626 m)   Wt 183 lb (83 kg)   SpO2 98%   BMI 31.41 kg/m  Vitals with BMI 02/19/2020 01/09/2020 01/09/2020  Height 5\' 4"  5\' 4"  5\' 4"   Weight 183 lbs 182 lbs 183 lbs 3 oz  BMI 31.4 37.62 83.15  Systolic 176 160 737  Diastolic 84 84 80  Pulse 83 66 67     Physical Exam Vitals and nursing note reviewed.  Constitutional:      Appearance: Normal appearance. She is obese.  HENT:     Head: Normocephalic and atraumatic.     Right Ear: External ear normal.     Left Ear: External ear normal.     Mouth/Throat:     Comments: Mask in place Eyes:  General:        Right eye: No discharge.        Left eye: No discharge.     Conjunctiva/sclera: Conjunctivae normal.  Cardiovascular:     Rate and Rhythm: Normal rate and regular rhythm.     Pulses: Normal pulses.     Heart sounds: Normal heart sounds.  Pulmonary:     Effort: Pulmonary effort is normal.     Breath sounds: Normal breath sounds.  Musculoskeletal:     Cervical back: Normal range of motion and neck supple.     Lumbar back: Tenderness present. Decreased range of motion.  Skin:    General: Skin is warm.  Neurological:     General: No focal deficit present.     Mental Status: She is alert and oriented to person, place, and time.  Psychiatric:        Mood and Affect: Mood normal.        Behavior: Behavior normal.        Thought Content: Thought content normal.        Judgment: Judgment normal.     Assessment   1. Back  pain with radiculopathy   2. Essential hypertension     Tests ordered Orders Placed This Encounter  Procedures  . MR LUMBAR SPINE WO CONTRAST  . Ambulatory referral to Orthopedic Surgery     Plan: Please see assessment and plan per problem list above.   Meds ordered this encounter  Medications  . ketorolac (TORADOL) injection 60 mg  . methylPREDNISolone acetate (DEPO-MEDROL) injection 80 mg    Patient to follow-up in 05/20/2020 .  Freddy Finner, NP

## 2020-02-20 ENCOUNTER — Encounter (HOSPITAL_COMMUNITY): Payer: Medicare Other | Admitting: Physical Therapy

## 2020-02-20 ENCOUNTER — Other Ambulatory Visit: Payer: Self-pay

## 2020-02-20 MED ORDER — ATORVASTATIN CALCIUM 80 MG PO TABS: 80.0000 mg | ORAL_TABLET | Freq: Every day | ORAL | 3 refills | Status: DC

## 2020-02-28 ENCOUNTER — Other Ambulatory Visit: Payer: Self-pay | Admitting: "Endocrinology

## 2020-03-03 ENCOUNTER — Other Ambulatory Visit: Payer: Self-pay

## 2020-03-03 ENCOUNTER — Emergency Department (HOSPITAL_COMMUNITY)
Admission: EM | Admit: 2020-03-03 | Discharge: 2020-03-03 | Disposition: A | Discharge status: Home / Self Care | Payer: Medicare Other | Attending: Emergency Medicine | Admitting: Emergency Medicine

## 2020-03-03 ENCOUNTER — Emergency Department (HOSPITAL_COMMUNITY): Payer: Medicare Other

## 2020-03-03 ENCOUNTER — Encounter (HOSPITAL_COMMUNITY): Payer: Self-pay | Admitting: *Deleted

## 2020-03-03 ENCOUNTER — Telehealth: Payer: Self-pay | Admitting: Emergency Medicine

## 2020-03-03 ENCOUNTER — Ambulatory Visit
Admission: EM | Admit: 2020-03-03 | Discharge: 2020-03-03 | Disposition: A | Discharge status: Home / Self Care | Payer: Medicare Other | Source: Home / Self Care

## 2020-03-03 DIAGNOSIS — Z87891 Personal history of nicotine dependence: Secondary | ICD-10-CM | POA: Insufficient documentation

## 2020-03-03 DIAGNOSIS — R42 Dizziness and giddiness: Secondary | ICD-10-CM | POA: Diagnosis not present

## 2020-03-03 DIAGNOSIS — S299XXA Unspecified injury of thorax, initial encounter: Secondary | ICD-10-CM | POA: Diagnosis not present

## 2020-03-03 DIAGNOSIS — R55 Syncope and collapse: Secondary | ICD-10-CM

## 2020-03-03 DIAGNOSIS — Z79899 Other long term (current) drug therapy: Secondary | ICD-10-CM | POA: Insufficient documentation

## 2020-03-03 DIAGNOSIS — I10 Essential (primary) hypertension: Secondary | ICD-10-CM | POA: Diagnosis not present

## 2020-03-03 DIAGNOSIS — E039 Hypothyroidism, unspecified: Secondary | ICD-10-CM | POA: Diagnosis not present

## 2020-03-03 LAB — CBC WITH DIFFERENTIAL/PLATELET
Abs Immature Granulocytes: 0.01 10*3/uL (ref 0.00–0.07)
Basophils Absolute: 0 10*3/uL (ref 0.0–0.1)
Basophils Relative: 1 %
Eosinophils Absolute: 0.1 10*3/uL (ref 0.0–0.5)
Eosinophils Relative: 1 %
HCT: 37.3 % (ref 36.0–46.0)
Hemoglobin: 11.7 g/dL — ABNORMAL LOW (ref 12.0–15.0)
Immature Granulocytes: 0 %
Lymphocytes Relative: 26 %
Lymphs Abs: 1.3 10*3/uL (ref 0.7–4.0)
MCH: 28.1 pg (ref 26.0–34.0)
MCHC: 31.4 g/dL (ref 30.0–36.0)
MCV: 89.7 fL (ref 80.0–100.0)
Monocytes Absolute: 0.4 10*3/uL (ref 0.1–1.0)
Monocytes Relative: 7 %
Neutro Abs: 3.2 10*3/uL (ref 1.7–7.7)
Neutrophils Relative %: 65 %
Platelets: 262 10*3/uL (ref 150–400)
RBC: 4.16 MIL/uL (ref 3.87–5.11)
RDW: 11.7 % (ref 11.5–15.5)
WBC: 5 10*3/uL (ref 4.0–10.5)
nRBC: 0 % (ref 0.0–0.2)

## 2020-03-03 LAB — URINALYSIS, ROUTINE W REFLEX MICROSCOPIC
Bilirubin Urine: NEGATIVE
Glucose, UA: NEGATIVE mg/dL
Hgb urine dipstick: NEGATIVE
Ketones, ur: NEGATIVE mg/dL
Leukocytes,Ua: NEGATIVE
Nitrite: NEGATIVE
Protein, ur: NEGATIVE mg/dL
Specific Gravity, Urine: 1.003 — ABNORMAL LOW (ref 1.005–1.030)
pH: 7 (ref 5.0–8.0)

## 2020-03-03 LAB — COMPREHENSIVE METABOLIC PANEL
ALT: 13 U/L (ref 0–44)
AST: 26 U/L (ref 15–41)
Albumin: 4.2 g/dL (ref 3.5–5.0)
Alkaline Phosphatase: 65 U/L (ref 38–126)
Anion gap: 12 (ref 5–15)
BUN: 6 mg/dL — ABNORMAL LOW (ref 8–23)
CO2: 27 mmol/L (ref 22–32)
Calcium: 9.8 mg/dL (ref 8.9–10.3)
Chloride: 99 mmol/L (ref 98–111)
Creatinine, Ser: 0.85 mg/dL (ref 0.44–1.00)
GFR calc Af Amer: >60 mL/min (ref >60)
GFR calc non Af Amer: >60 mL/min (ref >60)
Glucose, Bld: 110 mg/dL — ABNORMAL HIGH (ref 70–99)
Potassium: 3.3 mmol/L — ABNORMAL LOW (ref 3.5–5.1)
Sodium: 138 mmol/L (ref 135–145)
Total Bilirubin: 0.1 mg/dL — ABNORMAL LOW (ref 0.3–1.2)
Total Protein: 7.7 g/dL (ref 6.5–8.1)

## 2020-03-03 LAB — CBG MONITORING, ED: Glucose-Capillary: 116 mg/dL — ABNORMAL HIGH (ref 70–99)

## 2020-03-03 LAB — TROPONIN I (HIGH SENSITIVITY)
Troponin I (High Sensitivity): 4 ng/L (ref <18)
Troponin I (High Sensitivity): 2 ng/L (ref <18)

## 2020-03-03 LAB — LIPASE, BLOOD: Lipase: 17 U/L (ref 11–51)

## 2020-03-03 IMAGING — DX DG CHEST 1V PORT
1 series · 1 of 1 positions shown · non-contrast
Comparison: Prior chest radiographs [DATE]

CLINICAL DATA: Syncope, weakness. Additional provided: Single
episode on [REDACTED] evening, fall, dizziness

EXAM:
PORTABLE CHEST 1 VIEW

[chest ap]
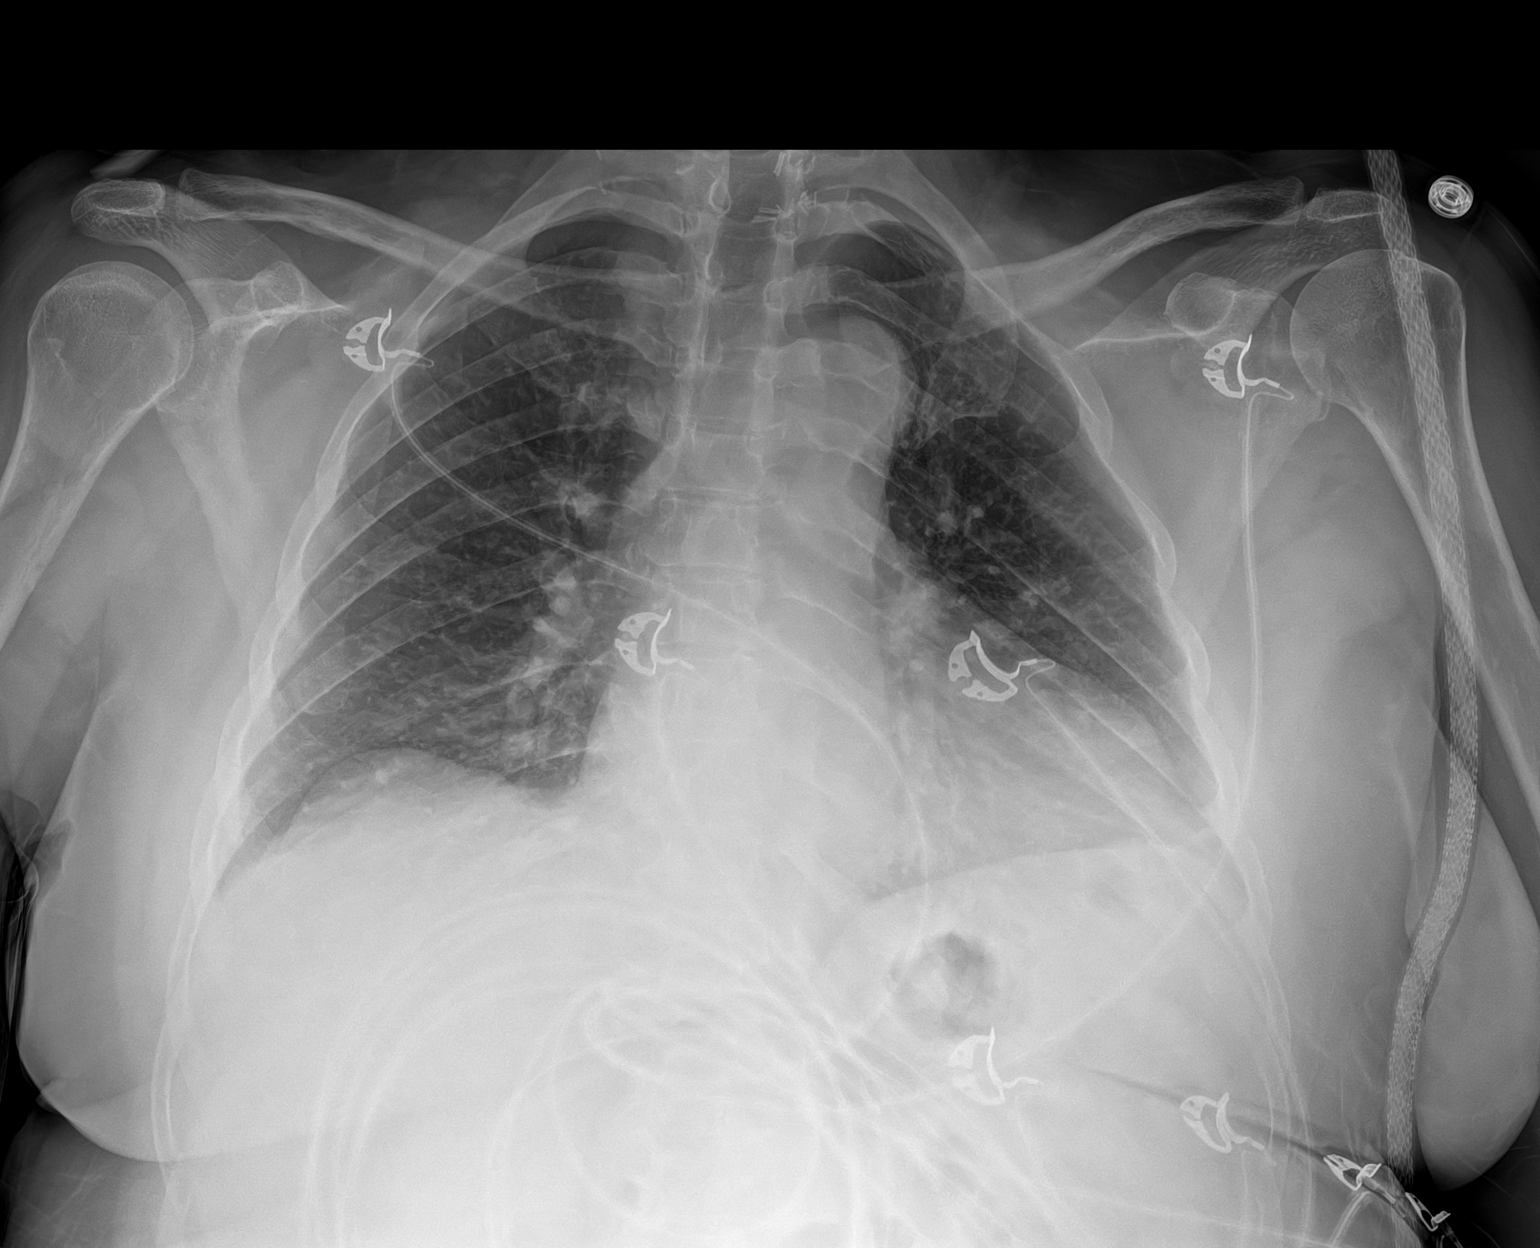

[1 of 1 positions shown; findings below may reference images not displayed]

FINDINGS: Heart size at the upper limits of normal, unchanged and accentuated
on this AP projection radiograph. No appreciable airspace
consolidation within the lungs. No evidence of pleural effusion or
pneumothorax. No acute bony abnormality identified. Surgical clips
project in the region of the lower neck.
IMPRESSION: No evidence of acute cardiopulmonary abnormality.

## 2020-03-03 IMAGING — CT CT HEAD W/O CM
3 series · 16 of 47 positions shown, 19 images · non-contrast
Comparison: None.

CLINICAL DATA: Recent syncopal episode

EXAM:
CT HEAD WITHOUT CONTRAST
TECHNIQUE: Contiguous axial images were obtained from the base of the skull
through the vertex without intravenous contrast.

[Series 2: head w o · axial · 0.42mm/px · z∈[-46,+79]mm · 10 of 30 slices shown, 13 images]
[im 3/30  brain]
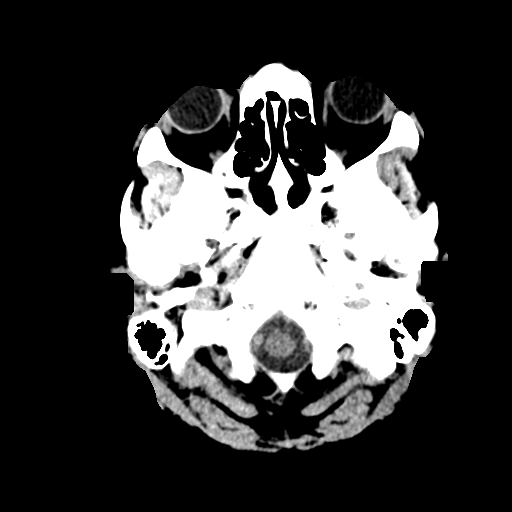
[im 3/30  bone]
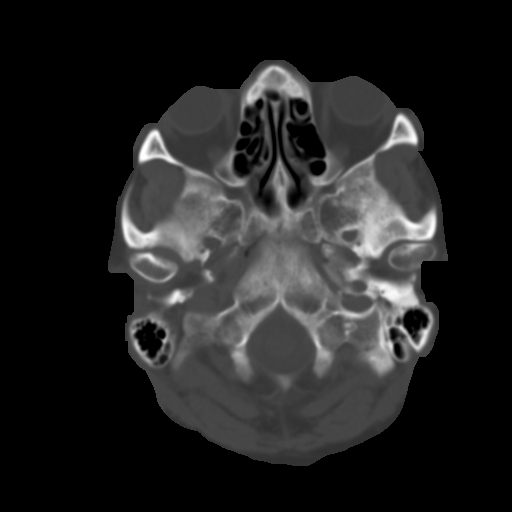
[im 6/30  brain]
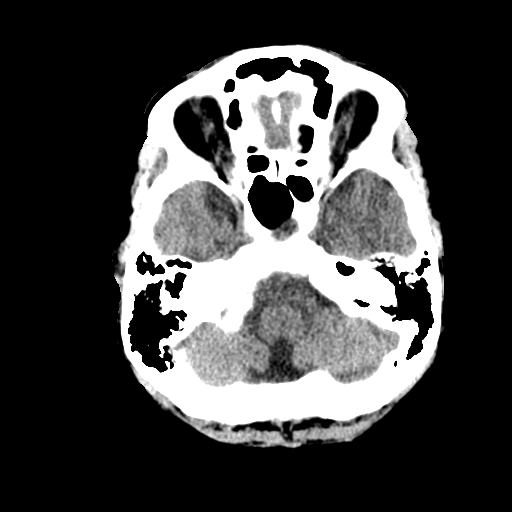
[im 9/30  brain]
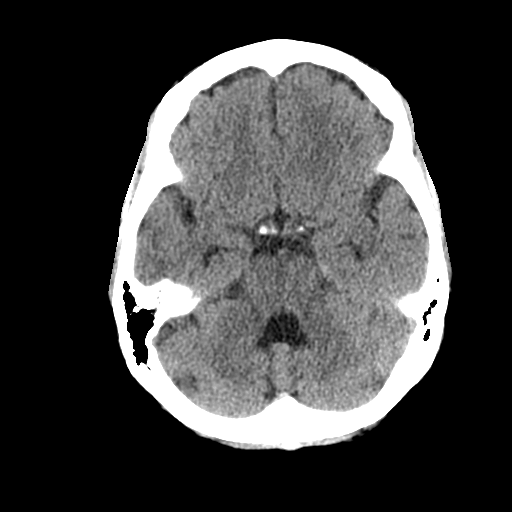
[im 11/30  brain]
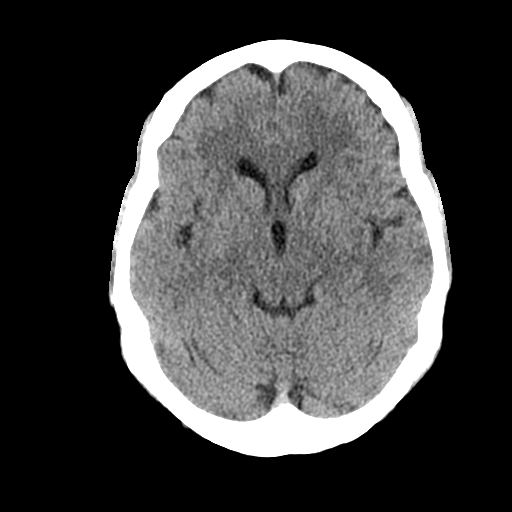
[im 14/30  brain]
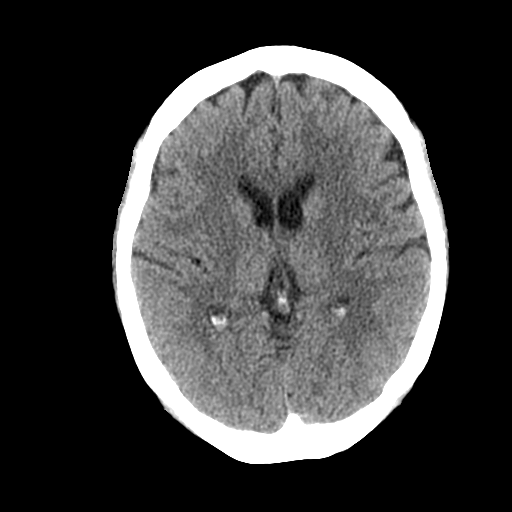
[im 14/30  bone]
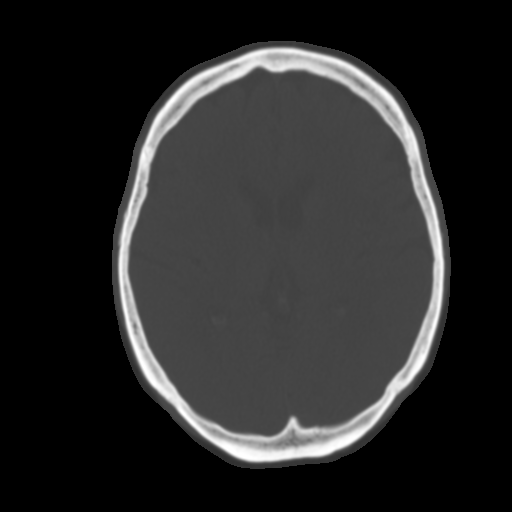
[im 17/30  brain]
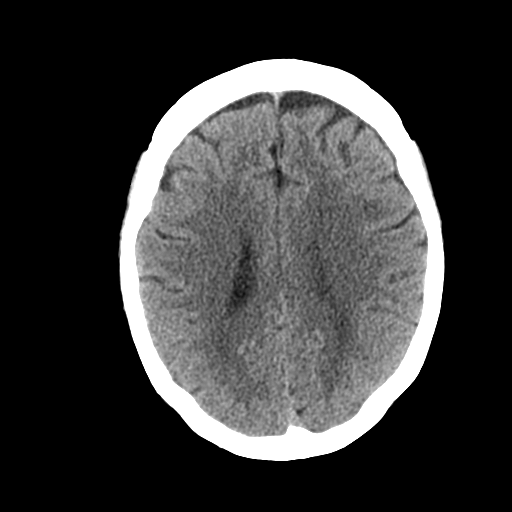
[im 20/30  brain]
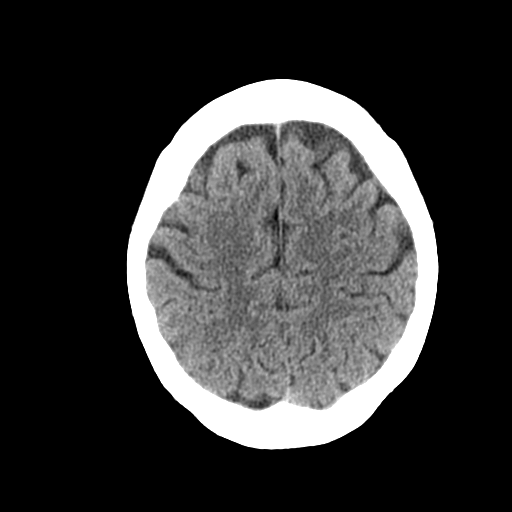
[im 23/30  brain]
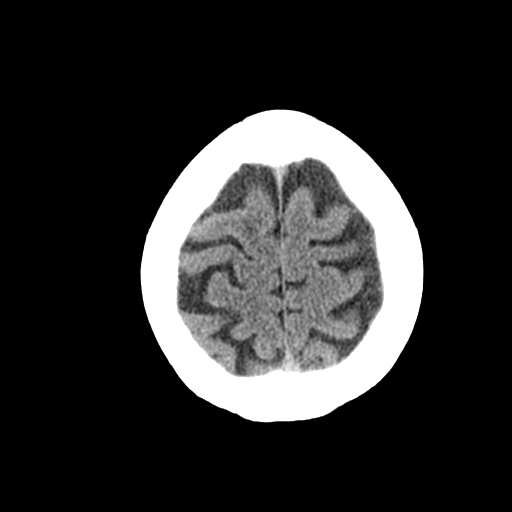
[im 25/30  brain]
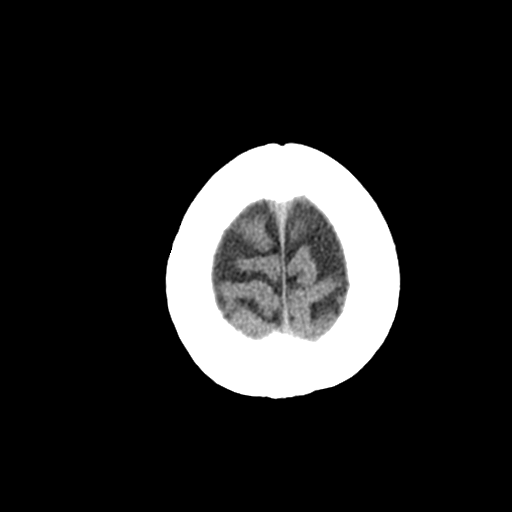
[im 25/30  bone]
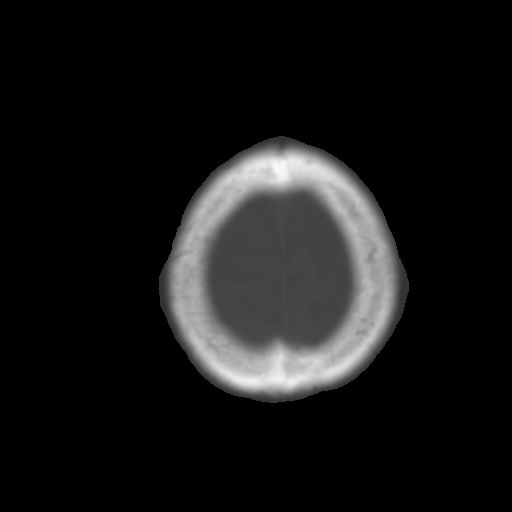
[im 28/30  brain]
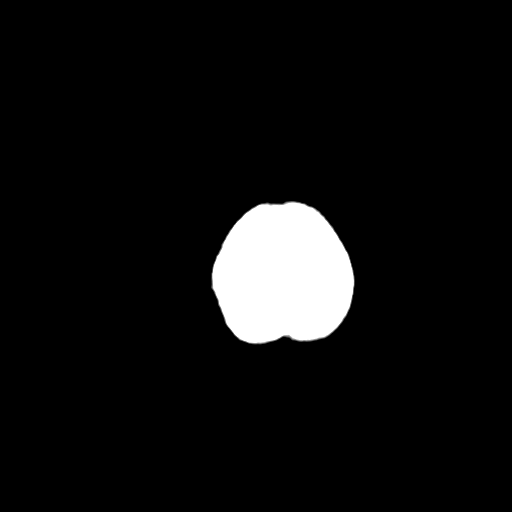

[Series 4: coronal soft · coronal · 0.34mm/px · 3 of 67 slices shown]
[im 23/67  brain]
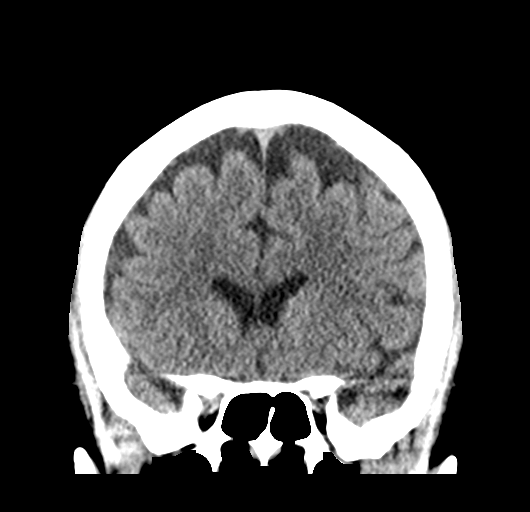
[im 30/67  brain]
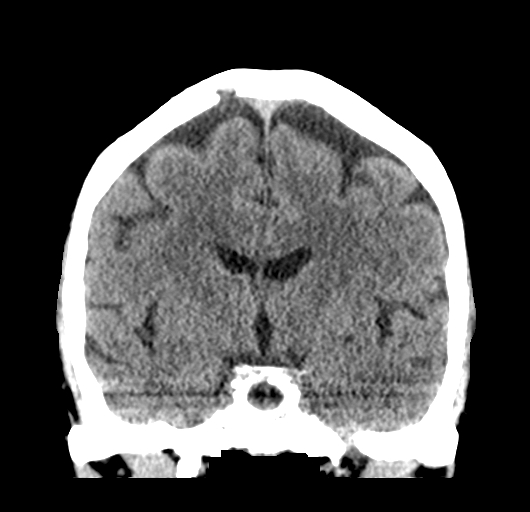
[im 37/67  brain]
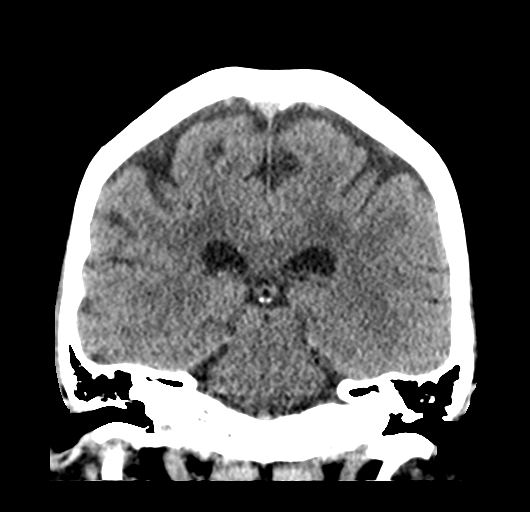

[Series 5: sagittal soft · sagittal · 0.33mm/px · 3 of 58 slices shown]
[im 20/58  brain]
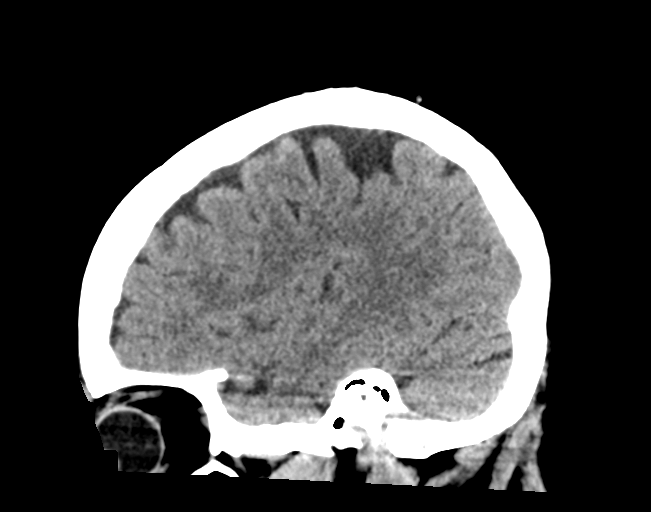
[im 29/58  brain]
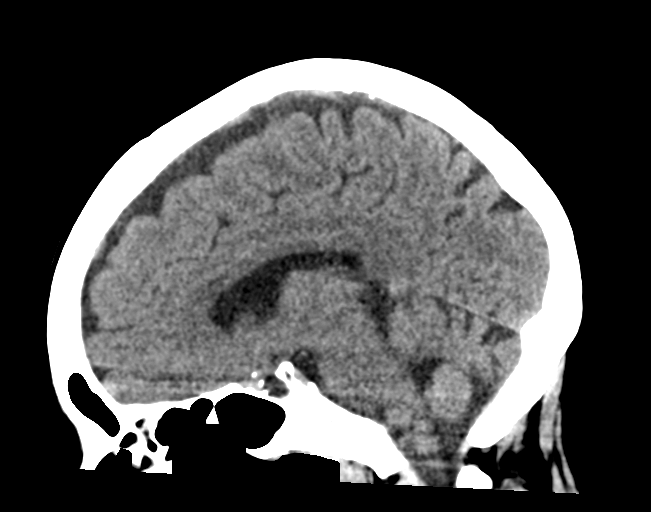
[im 39/58  brain]
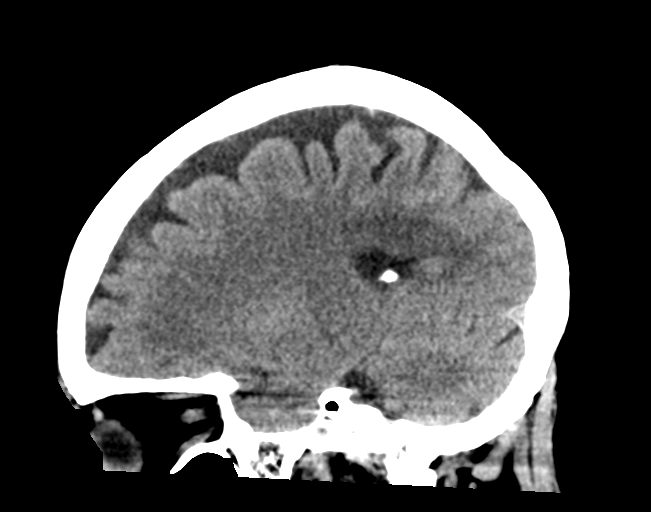

[16 of 47 positions shown; findings below may reference images not displayed]

FINDINGS: Brain: There is no acute intracranial hemorrhage, mass effect, or
edema. Gray-white differentiation is preserved. There is no
extra-axial fluid collection. Ventricles and sulci are within normal
limits in size and configuration. Minimal patchy hypoattenuation in
the supratentorial white matter is nonspecific but may reflect minor
chronic microvascular ischemic changes.

Vascular: There is mild atherosclerotic calcification at the skull
base.

Skull: Calvarium is unremarkable.

Sinuses/Orbits: No acute finding.

Other: None.
IMPRESSION: No acute intracranial hemorrhage, mass effect, or evidence of acute
infarction.

## 2020-03-03 MED ORDER — MECLIZINE HCL 25 MG PO TABS
25.0000 mg | ORAL_TABLET | Freq: Three times a day (TID) | ORAL | 0 refills | Status: DC | PRN
Start: 2020-03-03 — End: 2020-04-14

## 2020-03-03 MED ORDER — MECLIZINE HCL 12.5 MG PO TABS
25.0000 mg | ORAL_TABLET | Freq: Once | ORAL | Status: AC
  Administered 2020-03-03: 13:00:00 25 mg via ORAL
  Filled 2020-03-03: qty 2

## 2020-03-03 MED ORDER — ACETAMINOPHEN 325 MG PO TABS
650.0000 mg | ORAL_TABLET | Freq: Once | ORAL | Status: AC
  Administered 2020-03-03: 12:00:00 650 mg via ORAL
  Filled 2020-03-03: qty 2

## 2020-03-03 NOTE — Discharge Instructions (Addendum)
Drink plenty of fluids.  Take the medication as directed if needed for the dizziness.  Follow-up with Dr. Anthony Sar office this week.  Return the emergency department if you develop any worsening symptoms.

## 2020-03-03 NOTE — ED Provider Notes (Signed)
[facility] EMERGENCY DEPARTMENT Provider Note   CSN: [id number] Arrival date & time: 03/03/20  1011     History Chief Complaint  Patient presents with  . Loss of Consciousness    Laura [name] is a 69 y.o. female.  HPI      Laura [name] is a 69 y.o. female with history of HTN, anemia, GERD who presents to the Emergency Department complaining of syncopal episode that occurred on Sunday.  She was lying on the bed when she suddenly felt flush and "hot" she started down the hall to find her husband and remembers him standing over her and talking to her.  She is unsure the duration of the episode, but states that since that time she has had several episodes of feeling "hot all over and in my belly" and she feels unsteady with a sense of movement when she stands up.  Symptoms have been associated with urges to defecate, but she denies having diarrhea.  She was seen at urgent care this morning and advised to come to ER for higher level of care.  She denies recent illness, fever, chills,  Chest pain, shortness of breath, numbness or weakness of her face or extremities.  No difficulty with speech or recall.     Past Medical History:  Diagnosis Date  . Anxiety   . At low risk for fall 08/15/2018  . Cystitis   . Depression   . Gastritis   . GERD (gastroesophageal reflux disease)   . Hashimoto's thyroiditis    Hx   . Hyperlipidemia   . Hypertension   . Hypothyroidism   . Normocytic anemia 2009 Hb 9.9-11.1  . Osteopenia   . Shoulder pain   . Urinary incontinence     Patient Active Problem List   Diagnosis Date Noted  . Lumbar spine painful on movement 12/02/2019  . Bursitis of right shoulder 10/21/2019  . Obesity (BMI 30.0-34.9) 06/17/2019  . OA (osteoarthritis) of knee 08/09/2017  . Constipation 02/05/2016  . Allergic rhinitis 10/08/2014  . GAD (generalized anxiety disorder) 09/23/2014  . Back pain with radiculopathy 04/14/2014  . POSTSURGICAL HYPOTHYROIDISM 06/22/2010  .  Normocytic anemia 08/04/2009  . GERD 04/20/2009  . Hyperlipidemia LDL goal <100 05/16/2008  . Essential hypertension 05/16/2008  . OSTEOPENIA 05/16/2008  . Urinary incontinence 05/16/2008    Past Surgical History:  Procedure Laterality Date  . Bilateral tubal ligation    . BREAST EXCISIONAL BIOPSY  2010   Excisional biopsy of benign left breast mass -lopoma   . BREAST SURGERY     left nreast biopsy  . COLONOSCOPY  08/26/2011   SLF: 1. Internal hemorrhoids  . COLONOSCOPY N/A 10/06/2015   NLZ:JQBH fissure or internal hemorrhoids/mild sigmoid colitis. benign colonic path   . colonscopy  2005   Dr. Tamala Julian  . ESOPHAGOGASTRODUODENOSCOPY  12/2009   chronic gastritis  . ESOPHAGOGASTRODUODENOSCOPY N/A 05/17/2017   Dr. Oneida Alar; gastritis, normal small bowel biopsies, multiple gastric polyps. fundic gland polyps  . GIVENS CAPSULE STUDY N/A 05/30/2017   gastritis, no source for anemia identified  . left knee surgery Left March 1,2017  . PARTIAL KNEE ARTHROPLASTY Left 08/09/2017   Procedure: LEFT UNICOMPARTMENTAL KNEE;  Surgeon: Gaynelle Arabian, MD;  Location: [facility]S;  Service: Orthopedics;  Laterality: Left;  . Resection of left lobe of thyroid    . right carpal tunnel release    . rt. knee athroscopy  2004  . SAVORY DILATION N/A 05/17/2017   Procedure: SAVORY DILATION;  Surgeon:  Fields, Darleene Cleaver, MD;  Location: [facility] SUITE;  Service: Endoscopy;  Laterality: N/A;  . TOTAL ABDOMINAL HYSTERECTOMY  1994  . UMBILICAL HERNIA REPAIR    . Urethral dilation for stenosis  2009     OB History   No obstetric history on file.     Family History  Problem Relation Age of Onset  . Pneumonia Mother   . Healthy Father   . Colon cancer Neg Hx   . Anesthesia problems Neg Hx   . Hypotension Neg Hx   . Malignant hyperthermia Neg Hx   . Pseudochol deficiency Neg Hx     Social History   Tobacco Use  . Smoking status: Former Smoker    Packs/day: 0.25    Years: 1.00    Pack years: 0.25    Types:  Cigarettes    Quit date: 08/25/1972    Years since quitting: 47.5  . Smokeless tobacco: Never Used  Substance Use Topics  . Alcohol use: No  . Drug use: No    Home Medications Prior to Admission medications   Medication Sig Start Date End Date Taking? Authorizing Provider  acetaminophen (TYLENOL) 500 MG tablet Take 1 tablet (500 mg total) by mouth every 6 (six) hours as needed. 11/15/19   Avegno, Zachery Dakins, FNP  amLODipine (NORVASC) 2.5 MG tablet TAKE 1 TABLET(2.5 MG) BY MOUTH DAILY 03/04/19   Kerri Perches, MD  atorvastatin (LIPITOR) 80 MG tablet Take 1 tablet (80 mg total) by mouth daily. 02/20/20   Kerri Perches, MD  citalopram (CELEXA) 20 MG tablet TAKE 1 TABLET BY MOUTH  DAILY 06/26/19   Freddy Finner, NP  gabapentin (NEURONTIN) 100 MG capsule Take 1 capsule (100 mg total) by mouth at bedtime. 10/21/19   Kerri Perches, MD  hydrochlorothiazide (HYDRODIURIL) 25 MG tablet TAKE 1 TABLET BY MOUTH  DAILY 08/14/19   Kerri Perches, MD  hydrOXYzine (ATARAX/VISTARIL) 25 MG tablet Take 1 tablet (25 mg total) by mouth 3 (three) times daily as needed. 07/23/19   Kerri Perches, MD  levothyroxine (SYNTHROID) 112 MCG tablet TAKE 1 TABLET BY MOUTH ONCE DAILY BEFORE BREAKFAST 03/02/20   Roma Kayser, MD  linaclotide [facility]) 72 MCG capsule Take 1 capsule (72 mcg total) by mouth daily before breakfast. 09/27/17   Gelene Mink, NP  loratadine (CLARITIN) 10 MG tablet Take 1 tablet (10 mg total) by mouth daily. Patient taking differently: Take 10 mg by mouth daily as needed for allergies.  01/04/17   Kerri Perches, MD  pantoprazole (PROTONIX) 40 MG tablet TAKE 1 TABLET BY MOUTH  TWICE DAILY 06/26/19   Freddy Finner, NP  Polyethyl Glycol-Propyl Glycol (SYSTANE) 0.4-0.3 % SOLN Apply 1 drop to eye 3 (three) times daily as needed (for dry eyes.).    [provider]  potassium chloride (K-DUR) 10 MEQ tablet Take 1 tablet (10 mEq total) by mouth 2 (two) times  daily. 09/20/17   Kerri Perches, MD  Probiotic Product (PROBIOTIC ADVANCED PO) Take 1 capsule by mouth daily.    [provider]  traMADol (ULTRAM) 50 MG tablet TAKE ONE TABLET BY MOUTH 3 TIMES DAILY AS NEEDED, FOR BACK PAIN 11/18/19   Kerri Perches, MD  triamcinolone cream (KENALOG) 0.1 % Apply twice daily to rash  On right leg for 5 days , then as needed 07/23/19   Kerri Perches, MD    Allergies    Cozaar [losartan potassium], Dilaudid [hydromorphone hcl], Levaquin [  levofloxacin], Nsaids, and Promethazine  Review of Systems   Review of Systems  Constitutional: Negative.  Negative for fever.  HENT: Negative for ear pain and trouble swallowing.   Eyes: Negative for visual disturbance.  Respiratory: Negative for cough, chest tightness and shortness of breath.   Cardiovascular: Negative for chest pain.  Gastrointestinal: Positive for abdominal pain. Negative for diarrhea, nausea and vomiting.  Genitourinary: Negative for dysuria, frequency and hematuria.  Musculoskeletal: Negative for back pain and neck pain.  Skin: Negative for rash.  Neurological: Positive for syncope. Negative for dizziness, facial asymmetry, weakness, numbness and headaches.  Hematological: Does not bruise/bleed easily.  Psychiatric/Behavioral: The patient is not nervous/anxious.     Physical Exam Updated Vital Signs BP (!) 169/96 (BP Location: Right Arm)   Pulse 75   Temp 98.5 F (36.9 C) (Oral)   Resp 18   Ht  (1.626 m)   Wt 83 kg   SpO2 98%   BMI 31.41 kg/m   Physical Exam Vitals and nursing note reviewed.  Constitutional:      General: She is not in acute distress.    Appearance: Normal appearance. She is not ill-appearing.  HENT:     Head: Normocephalic.     Mouth/Throat:     Mouth: Mucous membranes are moist.     Pharynx: Oropharynx is clear.  Eyes:     Extraocular Movements: Extraocular movements intact.     Conjunctiva/sclera: Conjunctivae normal.     Pupils:  Pupils are equal, round, and reactive to light.  Cardiovascular:     Rate and Rhythm: Normal rate and regular rhythm.     Pulses: Normal pulses.  Pulmonary:     Effort: Pulmonary effort is normal.     Breath sounds: Normal breath sounds.  Chest:     Chest wall: No tenderness.  Abdominal:     Palpations: Abdomen is soft.     Tenderness: There is abdominal tenderness.     Comments: Mild epigastric tenderness.  abd is soft.    Musculoskeletal:        General: Normal range of motion.     Cervical back: Normal range of motion. No rigidity.  Lymphadenopathy:     Cervical: No cervical adenopathy.  Skin:    General: Skin is warm.     Capillary Refill: Capillary refill takes less than 2 seconds.  Neurological:     General: No focal deficit present.     Mental Status: She is alert.     GCS: GCS eye subscore is 4. GCS verbal subscore is 5. GCS motor subscore is 6.     Sensory: Sensation is intact. No sensory deficit.     Motor: No weakness.     Coordination: Coordination is intact.     Comments: CN II-XII intact.  Speech clear, no pronator dift, nml finger-nose and heel-shin testing.       ED Results / Procedures / Treatments   Labs (all labs ordered are listed, but only abnormal results are displayed) Labs Reviewed  COMPREHENSIVE METABOLIC PANEL - Abnormal; Notable for the following components:      Result Value   Potassium 3.3 (*)    Glucose, Bld 110 (*)    BUN 6 (*)    Total Bilirubin 0.1 (*)    All other components within normal limits  CBC WITH DIFFERENTIAL/PLATELET - Abnormal; Notable for the following components:   Hemoglobin 11.7 (*)    All other components within normal limits  URINALYSIS, ROUTINE W REFLEX  MICROSCOPIC - Abnormal; Notable for the following components:   Color, Urine STRAW (*)    Specific Gravity, Urine 1.003 (*)    All other components within normal limits  CBG MONITORING, ED - Abnormal; Notable for the following components:   Glucose-Capillary 116 (*)     All other components within normal limits  LIPASE, BLOOD  TROPONIN I (HIGH SENSITIVITY)  TROPONIN I (HIGH SENSITIVITY)    EKG EKG Interpretation  Date/Time:  Tuesday Mar 03 2020 10:33:50 EDT Ventricular Rate:  74 PR Interval:    QRS Duration: 89 QT Interval:  393 QTC Calculation: 439 R Axis:   9 Text Interpretation: Sinus rhythm Baseline wander Confirmed by Cathren Laine ([id number]) on 03/03/2020 10:39:50 AM   Radiology CT Head Wo Contrast  Result Date: 03/03/2020 CLINICAL DATA:  Recent syncopal episode EXAM: CT HEAD WITHOUT CONTRAST TECHNIQUE: Contiguous axial images were obtained from the base of the skull through the vertex without intravenous contrast. COMPARISON:  None. FINDINGS: Brain: There is no acute intracranial hemorrhage, mass effect, or edema. Gray-white differentiation is preserved. There is no extra-axial fluid collection. Ventricles and sulci are within normal limits in size and configuration. Minimal patchy hypoattenuation in the supratentorial white matter is nonspecific but may reflect minor chronic microvascular ischemic changes. Vascular: There is mild atherosclerotic calcification at the skull base. Skull: Calvarium is unremarkable. Sinuses/Orbits: No acute finding. Other: None. IMPRESSION: No acute intracranial hemorrhage, mass effect, or evidence of acute infarction. Electronically Signed   By: Guadlupe Spanish M.D.   On: 03/03/2020 12:24   DG Chest Portable 1 View  Result Date: 03/03/2020 CLINICAL DATA:  Syncope, weakness. Additional provided: Single episode on Sunday evening, fall, dizziness EXAM: PORTABLE CHEST 1 VIEW COMPARISON:  Prior chest radiographs 06/21/2017 FINDINGS: Heart size at the upper limits of normal, unchanged and accentuated on this AP projection radiograph. No appreciable airspace consolidation within the lungs. No evidence of pleural effusion or pneumothorax. No acute bony abnormality identified. Surgical clips project in the region of the lower neck.  IMPRESSION: No evidence of acute cardiopulmonary abnormality. Electronically Signed   By: Jackey Loge DO   On: 03/03/2020 11:27    Procedures Procedures (including critical care time)  Medications Ordered in ED Medications  acetaminophen (TYLENOL) tablet 650 mg (650 mg Oral Given 03/03/20 1212)  meclizine (ANTIVERT) tablet 25 mg (25 mg Oral Given 03/03/20 1248)    ED Course  I have reviewed the triage vital signs and the nursing notes.  Pertinent labs & imaging results that were available during my care of the patient were reviewed by me and considered in my medical decision making (see chart for details).    MDM Rules/Calculators/A&P                      Orthostatic VS for the past 24 hrs:  BP- Lying Pulse- Lying BP- Sitting Pulse- Sitting BP- Standing at 0 minutes Pulse- Standing at 0 minutes  03/03/20 1033 163/79 73 (!) 167/93 76 (!) 159/91 79   Pt here reporting syncopal episode 3 days ago and dizziness with standing or positional change.  Likely vertiginous.  No focal neuro deficits on exam.  She appears well and non-toxic.  Ambulated in the dept and gait was steady.  No headache or nuchal rigidity.  No orthostatic hypotension.  I will obtain labs, urine, EKG and CT head and CXR.    Work up today is reassuring.  Unclear cause of pt's reported symptoms.  No concerning sx's  for ACS, infectious process, TIA or CVA.   She has received tylenol and meclizine.  On recheck, pt reports feeling better and states she is ready for d/c home.  She has ambulated to the restroom and no longer feels symptomatic.    Pt also seen by Dr. Denton Lank and care plan discussed.  I feel she is appropriate for d/c home and she agrees to close out pt f/u.  Strict return precautions discussed.     Final Clinical Impression(s) / ED Diagnoses Final diagnoses:  Dizziness  Syncope and collapse    Rx / DC Orders ED Discharge Orders    None       Pauline Aus, PA-C 03/03/20 1615    Cathren Laine,  MD 03/04/20 1447

## 2020-03-03 NOTE — Telephone Encounter (Signed)
Called pt notified her of providers recommendations. Pt stated seh will be going to the er and she will increase her medication to twice a day

## 2020-03-03 NOTE — ED Triage Notes (Signed)
Patient sent over from Urgent care from request for evaluation of a syncopal episode on Sunday evening.  Patient fell to the floor after feeling dizzy.

## 2020-03-03 NOTE — ED Triage Notes (Signed)
Pt states on Sunday that she felt a hot flash all over body and possibly lost consciousness, pt stating that she has been fatigue and hot flash in her abdomen that makes her feel like she will " fall out ". Encouraged pt to go to ED for higher level of care

## 2020-03-03 NOTE — Telephone Encounter (Signed)
Needs to see PCP about passing out and go to ED if feeling faint. This is not a GI issue.   Let's increase PP to BID. She may have decreased this to just once daily since I last saw her.

## 2020-03-03 NOTE — Telephone Encounter (Signed)
Pt stated she passed out on Sunday. And she is having acid reflux with a dry throat, she states she is also burping a lot. Denies fever , vomiting, black stools. Pt says she has been having regular bms and went yesterday morning. Pt wants to  Know if she should go to the er

## 2020-03-05 ENCOUNTER — Telehealth (INDEPENDENT_AMBULATORY_CARE_PROVIDER_SITE_OTHER): Payer: Medicare Other | Admitting: Family Medicine

## 2020-03-05 ENCOUNTER — Other Ambulatory Visit: Payer: Self-pay

## 2020-03-05 ENCOUNTER — Encounter: Payer: Self-pay | Admitting: Family Medicine

## 2020-03-05 VITALS — BP 134/77 | Ht 64.0 in | Wt 183.0 lb

## 2020-03-05 DIAGNOSIS — K30 Functional dyspepsia: Secondary | ICD-10-CM | POA: Diagnosis not present

## 2020-03-05 DIAGNOSIS — R6889 Other general symptoms and signs: Secondary | ICD-10-CM | POA: Diagnosis not present

## 2020-03-05 DIAGNOSIS — Z09 Encounter for follow-up examination after completed treatment for conditions other than malignant neoplasm: Secondary | ICD-10-CM

## 2020-03-05 NOTE — Assessment & Plan Note (Signed)
Review emergency room note, labs, test and medication orders. Treatment with meclizine can continue as needed.  Most likely related to something that she ate that upset her stomach.  She is encouraged to make sure that she stays hydrated, takes probiotics, uses Pepto over-the-counter medications for symptom management.  If she does not get any better she is advised to call the office back.

## 2020-03-05 NOTE — Progress Notes (Signed)
Virtual Visit via Telephone Note   This visit type was conducted due to national recommendations for restrictions regarding the COVID-19 Pandemic (e.g. social distancing) in an effort to limit this patient's exposure and mitigate transmission in our community.  Due to her co-morbid illnesses, this patient is at least at moderate risk for complications without adequate follow up.  This format is felt to be most appropriate for this patient at this time.  The patient did not have access to video technology/had technical difficulties with video requiring transitioning to audio format only (telephone).  All issues noted in this document were discussed and addressed.  No physical exam could be performed with this format.    Evaluation Performed:  Follow-up visit  Date:  03/05/2020   ID:  Laura [name], DOB June 15, 1951, MRN [id number]  Patient Location: Home Provider Location: Office  Location of Patient: Home Location of Provider: Telehealth Consent was obtain for visit to be over via telehealth. I verified that I am speaking with the correct person using two identifiers.  PCP:  Fayrene Helper, MD   Chief Complaint: ER follow-up  History of Present Illness:    Laura [name] is a 69 y.o. female with history of hypertension, anemia, hyperlipidemia, hypothyroidism and GERD.  Reported to the emergency room complaining of syncopal episode that occurred on Sunday.  She reports that she was lying in her bed and suddenly felt flushed and hot and started down the hall to find her husband and remembers him staying overheard talking to her.  She is unsure the duration of the episode but states that since that time she has had several episodes of feeling hot all over especially around her belly area.  She reports that she feels unsteady with a sense of movement when she stands up.  She reports that she ate some bad shrimp she thinks on Sunday when all of this started.  Symptoms have been associated  with urges to defecate but she denies having any diarrhea.  She has been seen in the urgent care the morning prior to going to the emergency room of which she was advised to go to the emergency room for higher level of care.  Physical exam in the emergency room demonstrated mild epigastric tenderness, abdominal is soft.  Neurologically intact.  Blood pressure was elevated.  Work-up was reassuring.  Not concern for acute coronary syndrome, infectious process, TIA or CVA.  She received Tylenol and meclizine.  She reported that she started to feel better and she was ready go home.  She was discharged on meclizine reports taking it some and it has helped.  But she just does not feel better.  She thought she might need an antibiotic.  She denied any recent illness, fevers, chills, chest pain, shortness of breath numbness, weakness in the face or extremities, difficulty with speech and/or recall.  Today she reports that she is feeling a little bit better but she still has a little discomfort and warmness of her belly.  She thought she had gastroenteritis when she looked up her symptoms online.  Reports that she is not been taking anything over-the-counter except for some Pepto.  And she is gone the bathroom a few times she just does not know why she keeps feeling hot and flushed sensation and she does not think that is hot flashes.  The patient does not have symptoms concerning for COVID-19 infection (fever, chills, cough, or new shortness of breath).   Past Medical,  Surgical, Social History, Allergies, and Medications have been Reviewed.  Past Medical History:  Diagnosis Date  . Anxiety   . At low risk for fall 08/15/2018  . Cystitis   . Depression   . Gastritis   . GERD (gastroesophageal reflux disease)   . Hashimoto's thyroiditis    Hx   . Hyperlipidemia   . Hypertension   . Hypothyroidism   . Normocytic anemia 2009 Hb 9.9-11.1  . Osteopenia   . Shoulder pain   . Urinary incontinence     Past Surgical History:  Procedure Laterality Date  . Bilateral tubal ligation    . BREAST EXCISIONAL BIOPSY  2010   Excisional biopsy of benign left breast mass -lopoma   . BREAST SURGERY     left nreast biopsy  . COLONOSCOPY  08/26/2011   SLF: 1. Internal hemorrhoids  . COLONOSCOPY N/A 10/06/2015   EFE:OFHQ fissure or internal hemorrhoids/mild sigmoid colitis. benign colonic path   . colonscopy  2005   Dr. Katrinka Blazing  . ESOPHAGOGASTRODUODENOSCOPY  12/2009   chronic gastritis  . ESOPHAGOGASTRODUODENOSCOPY N/A 05/17/2017   Dr. Darrick Penna; gastritis, normal small bowel biopsies, multiple gastric polyps. fundic gland polyps  . GIVENS CAPSULE STUDY N/A 05/30/2017   gastritis, no source for anemia identified  . left knee surgery Left March 1,2017  . PARTIAL KNEE ARTHROPLASTY Left 08/09/2017   Procedure: LEFT UNICOMPARTMENTAL KNEE;  Surgeon: Ollen Gross, MD;  Location: [facility];  Service: Orthopedics;  Laterality: Left;  . Resection of left lobe of thyroid    . right carpal tunnel release    . rt. knee athroscopy  2004  . SAVORY DILATION N/A 05/17/2017   Procedure: SAVORY DILATION;  Surgeon: West Bali, MD;  Location: [facility];  Service: Endoscopy;  Laterality: N/A;  . TOTAL ABDOMINAL HYSTERECTOMY  1994  . UMBILICAL HERNIA REPAIR    . Urethral dilation for stenosis  2009     Current Meds  Medication Sig  . acetaminophen (TYLENOL) 500 MG tablet Take 1 tablet (500 mg total) by mouth every 6 (six) hours as needed.  Marland Kitchen amLODipine (NORVASC) 2.5 MG tablet TAKE 1 TABLET(2.5 MG) BY MOUTH DAILY  . atorvastatin (LIPITOR) 80 MG tablet Take 1 tablet (80 mg total) by mouth daily.  . citalopram (CELEXA) 20 MG tablet TAKE 1 TABLET BY MOUTH  DAILY  . gabapentin (NEURONTIN) 100 MG capsule Take 1 capsule (100 mg total) by mouth at bedtime.  . hydrochlorothiazide (HYDRODIURIL) 25 MG tablet TAKE 1 TABLET BY MOUTH  DAILY  . hydrOXYzine (ATARAX/VISTARIL) 25 MG tablet Take 1 tablet (25 mg total) by mouth  3 (three) times daily as needed.  Marland Kitchen levothyroxine (SYNTHROID) 112 MCG tablet TAKE 1 TABLET BY MOUTH ONCE DAILY BEFORE BREAKFAST  . linaclotide (LINZESS) 72 MCG capsule Take 1 capsule (72 mcg total) by mouth daily before breakfast.  . loratadine (CLARITIN) 10 MG tablet Take 1 tablet (10 mg total) by mouth daily. (Patient taking differently: Take 10 mg by mouth daily as needed for allergies. )  . meclizine (ANTIVERT) 25 MG tablet Take 1 tablet (25 mg total) by mouth 3 (three) times daily as needed for dizziness.  . pantoprazole (PROTONIX) 40 MG tablet TAKE 1 TABLET BY MOUTH  TWICE DAILY  . Polyethyl Glycol-Propyl Glycol (SYSTANE) 0.4-0.3 % SOLN Apply 1 drop to eye 3 (three) times daily as needed (for dry eyes.).  Marland Kitchen potassium chloride (K-DUR) 10 MEQ tablet Take 1 tablet (10 mEq total) by mouth 2 (two) times daily.  Marland Kitchen  Probiotic Product (PROBIOTIC ADVANCED PO) Take 1 capsule by mouth daily.  . traMADol (ULTRAM) 50 MG tablet TAKE ONE TABLET BY MOUTH 3 TIMES DAILY AS NEEDED, FOR BACK PAIN  . triamcinolone cream (KENALOG) 0.1 % Apply twice daily to rash  On right leg for 5 days , then as needed     Allergies:   Cozaar [losartan potassium], Dilaudid [hydromorphone hcl], Levaquin [levofloxacin], Nsaids, and Promethazine   ROS:   Please see the history of present illness.    All other systems reviewed and are negative.   Labs/Other Tests and Data Reviewed:    Recent Labs: 12/31/2019: TSH 3.31 03/03/2020: ALT 13; BUN 6; Creatinine, Ser 0.85; Hemoglobin 11.7; Platelets 262; Potassium 3.3; Sodium 138   Recent Lipid Panel Lab Results  Component Value Date/Time   CHOL 224 (H) 02/10/2020 09:42 AM   CHOL 185 06/21/2017 11:13 AM   TRIG 99 02/10/2020 09:42 AM   HDL 60 02/10/2020 09:42 AM   HDL 46 06/21/2017 11:13 AM   CHOLHDL 3.7 02/10/2020 09:42 AM   LDLCALC 143 (H) 02/10/2020 09:42 AM    Wt Readings from Last 3 Encounters:  03/05/20 183 lb (83 kg)  03/03/20 182 lb 15.7 oz (83 kg)  02/19/20 183 lb  (83 kg)     Objective:    Vital Signs:  BP 134/77   Ht 5\' 4"  (1.626 m)   Wt 183 lb (83 kg)   BMI 31.41 kg/m    VITAL SIGNS:  reviewed GEN:  Alert and oriented RESPIRATORY:  No shortness of breath noted in conversation PSYCH:  Normal affect and mood  ASSESSMENT & PLAN:    1. Sensation of feeling hot  2. Upset stomach  3. Encounter for examination following treatment at hospital   Time:   Today, I have spent 10 minutes with the patient with telehealth technology discussing the above problems.     Medication Adjustments/Labs and Tests Ordered: Current medicines are reviewed at length with the patient today.  Concerns regarding medicines are outlined above.   Tests Ordered: No orders of the defined types were placed in this encounter.   Medication Changes: No orders of the defined types were placed in this encounter.   Disposition:  Follow up 05/20/2020 Signed, 05/22/2020, NP  03/05/2020 3:16 PM     05/05/2020 Primary Care [facility]

## 2020-03-05 NOTE — Assessment & Plan Note (Signed)
Upset stomach with some stools no nausea or vomiting.  She is advised to continue using Pepto as needed.  Get probiotics.  Hydrate very well.  And to stick to a bland diet for the next week.

## 2020-03-05 NOTE — Patient Instructions (Signed)
I appreciate the opportunity to provide you with care for your health and wellness. Today we discussed: recent ER visit    Follow up: As scheduled  No labs or referrals today  Focus on hydration, bland diet, probiotics.  If not feeling much better by next Tuesday please call the office.  Please continue to practice social distancing to keep you, your family, and our community safe.  If you must go out, please wear a mask and practice good handwashing.  It was a pleasure to see you and I look forward to continuing to work together on your health and well-being. Please do not hesitate to call the office if you need care or have questions about your care.  Have a wonderful day and week. With Gratitude, Tereasa Coop, DNP, AGNP-BC

## 2020-03-05 NOTE — Assessment & Plan Note (Signed)
Possibly could be a vagal response secondary to cramping She is encouraged to stay hydrated as the symptoms should go away as her stomach begins to feel better.

## 2020-03-08 ENCOUNTER — Other Ambulatory Visit: Payer: Self-pay

## 2020-03-08 ENCOUNTER — Encounter: Payer: Self-pay | Admitting: Emergency Medicine

## 2020-03-08 ENCOUNTER — Ambulatory Visit
Admission: EM | Admit: 2020-03-08 | Discharge: 2020-03-08 | Disposition: A | Discharge status: Home / Self Care | Payer: Medicare Other

## 2020-03-08 DIAGNOSIS — A084 Viral intestinal infection, unspecified: Secondary | ICD-10-CM | POA: Diagnosis not present

## 2020-03-08 NOTE — ED Triage Notes (Signed)
Pt here with diarrhea and cramping with some flushed feeling with cramping x 6 days; pt was seen in ED on Tuesday for same; pt denies vomiting; pt sts thinks she needs some antibiotics

## 2020-03-08 NOTE — ED Provider Notes (Addendum)
[facility]    CSN: [id number] Arrival date & time: 03/08/20  0920      History   Chief Complaint Chief Complaint  Patient presents with  . Diarrhea    HPI Laura [name] is a 69 y.o. female.   With history of GERD hypertension and anemia presented to the urgent care with a complaint of abdominal pain and diarrhea for the past few days.  She was seen at the urgent care previously and was sent to the ER.  States her symptom has resolved but is still experiencing tenderness and diarrhea.  Has 2 bowel movement this morning.  Has tried OTC Pepto-Bismol with mild relief.  Denies recent illness, fever, chills, nausea, vomiting, chest pain, shortness of breath, numbness or weakness or facial extremity  The history is provided by the patient. No language interpreter was used.  Diarrhea   Past Medical History:  Diagnosis Date  . Anxiety   . At low risk for fall 08/15/2018  . Cystitis   . Depression   . Gastritis   . GERD (gastroesophageal reflux disease)   . Hashimoto's thyroiditis    Hx   . Hyperlipidemia   . Hypertension   . Hypothyroidism   . Normocytic anemia 2009 Hb 9.9-11.1  . Osteopenia   . Shoulder pain   . Urinary incontinence     Patient Active Problem List   Diagnosis Date Noted  . Sensation of feeling hot 03/05/2020  . Upset stomach 03/05/2020  . Encounter for examination following treatment at hospital 03/05/2020  . Lumbar spine painful on movement 12/02/2019  . Bursitis of right shoulder 10/21/2019  . Obesity (BMI 30.0-34.9) 06/17/2019  . OA (osteoarthritis) of knee 08/09/2017  . Constipation 02/05/2016  . Allergic rhinitis 10/08/2014  . GAD (generalized anxiety disorder) 09/23/2014  . Back pain with radiculopathy 04/14/2014  . POSTSURGICAL HYPOTHYROIDISM 06/22/2010  . Normocytic anemia 08/04/2009  . GERD 04/20/2009  . Hyperlipidemia LDL goal <100 05/16/2008  . Essential hypertension 05/16/2008  . OSTEOPENIA 05/16/2008  . Urinary  incontinence 05/16/2008    Past Surgical History:  Procedure Laterality Date  . Bilateral tubal ligation    . BREAST EXCISIONAL BIOPSY  2010   Excisional biopsy of benign left breast mass -lopoma   . BREAST SURGERY     left nreast biopsy  . COLONOSCOPY  08/26/2011   SLF: 1. Internal hemorrhoids  . COLONOSCOPY N/A 10/06/2015   HAL:PFXT fissure or internal hemorrhoids/mild sigmoid colitis. benign colonic path   . colonscopy  2005   Dr. Katrinka Blazing  . ESOPHAGOGASTRODUODENOSCOPY  12/2009   chronic gastritis  . ESOPHAGOGASTRODUODENOSCOPY N/A 05/17/2017   Dr. Darrick Penna; gastritis, normal small bowel biopsies, multiple gastric polyps. fundic gland polyps  . GIVENS CAPSULE STUDY N/A 05/30/2017   gastritis, no source for anemia identified  . left knee surgery Left March 1,2017  . PARTIAL KNEE ARTHROPLASTY Left 08/09/2017   Procedure: LEFT UNICOMPARTMENTAL KNEE;  Surgeon: Ollen Gross, MD;  Location: [facility];  Service: Orthopedics;  Laterality: Left;  . Resection of left lobe of thyroid    . right carpal tunnel release    . rt. knee athroscopy  2004  . SAVORY DILATION N/A 05/17/2017   Procedure: SAVORY DILATION;  Surgeon: West Bali, MD;  Location: [facility];  Service: Endoscopy;  Laterality: N/A;  . TOTAL ABDOMINAL HYSTERECTOMY  1994  . UMBILICAL HERNIA REPAIR    . Urethral dilation for stenosis  2009    OB History   No obstetric  history on file.      Home Medications    Prior to Admission medications   Medication Sig Start Date End Date Taking? Authorizing Provider  acetaminophen (TYLENOL) 500 MG tablet Take 1 tablet (500 mg total) by mouth every 6 (six) hours as needed. 11/15/19   Tyshia Fenter, Zachery Dakins, FNP  amLODipine (NORVASC) 2.5 MG tablet TAKE 1 TABLET(2.5 MG) BY MOUTH DAILY 03/04/19   Kerri Perches, MD  atorvastatin (LIPITOR) 80 MG tablet Take 1 tablet (80 mg total) by mouth daily. 02/20/20   Kerri Perches, MD  citalopram (CELEXA) 20 MG tablet TAKE 1 TABLET BY MOUTH   DAILY 06/26/19   Freddy Finner, NP  gabapentin (NEURONTIN) 100 MG capsule Take 1 capsule (100 mg total) by mouth at bedtime. 10/21/19   Kerri Perches, MD  hydrochlorothiazide (HYDRODIURIL) 25 MG tablet TAKE 1 TABLET BY MOUTH  DAILY 08/14/19   Kerri Perches, MD  hydrOXYzine (ATARAX/VISTARIL) 25 MG tablet Take 1 tablet (25 mg total) by mouth 3 (three) times daily as needed. 07/23/19   Kerri Perches, MD  levothyroxine (SYNTHROID) 112 MCG tablet TAKE 1 TABLET BY MOUTH ONCE DAILY BEFORE BREAKFAST 03/02/20   Roma Kayser, MD  linaclotide [facility]) 72 MCG capsule Take 1 capsule (72 mcg total) by mouth daily before breakfast. 09/27/17   Gelene Mink, NP  loratadine (CLARITIN) 10 MG tablet Take 1 tablet (10 mg total) by mouth daily. Patient taking differently: Take 10 mg by mouth daily as needed for allergies.  01/04/17   Kerri Perches, MD  meclizine (ANTIVERT) 25 MG tablet Take 1 tablet (25 mg total) by mouth 3 (three) times daily as needed for dizziness. 03/03/20   Triplett, Tammy, PA-C  pantoprazole (PROTONIX) 40 MG tablet TAKE 1 TABLET BY MOUTH  TWICE DAILY 06/26/19   Freddy Finner, NP  Polyethyl Glycol-Propyl Glycol (SYSTANE) 0.4-0.3 % SOLN Apply 1 drop to eye 3 (three) times daily as needed (for dry eyes.).    [provider]  potassium chloride (K-DUR) 10 MEQ tablet Take 1 tablet (10 mEq total) by mouth 2 (two) times daily. 09/20/17   Kerri Perches, MD  Probiotic Product (PROBIOTIC ADVANCED PO) Take 1 capsule by mouth daily.    [provider]  traMADol (ULTRAM) 50 MG tablet TAKE ONE TABLET BY MOUTH 3 TIMES DAILY AS NEEDED, FOR BACK PAIN 11/18/19   Kerri Perches, MD  triamcinolone cream (KENALOG) 0.1 % Apply twice daily to rash  On right leg for 5 days , then as needed 07/23/19   Kerri Perches, MD    Family History Family History  Problem Relation Age of Onset  . Pneumonia Mother   . Healthy Father   . Colon cancer Neg Hx   .  Anesthesia problems Neg Hx   . Hypotension Neg Hx   . Malignant hyperthermia Neg Hx   . Pseudochol deficiency Neg Hx     Social History Social History   Tobacco Use  . Smoking status: Former Smoker    Packs/day: 0.25    Years: 1.00    Pack years: 0.25    Types: Cigarettes    Quit date: 08/25/1972    Years since quitting: 47.5  . Smokeless tobacco: Never Used  Substance Use Topics  . Alcohol use: No  . Drug use: No     Allergies   Cozaar [losartan potassium], Dilaudid [hydromorphone hcl], Levaquin [levofloxacin], Nsaids, and Promethazine   Review of Systems Review of  Systems  Constitutional: Negative.   Respiratory: Negative.   Cardiovascular: Negative.   Gastrointestinal: Positive for diarrhea.  All other systems reviewed and are negative.    Physical Exam Triage Vital Signs ED Triage Vitals  Enc Vitals Group     BP 03/08/20 0947 137/76     Pulse Rate 03/08/20 0947 79     Resp 03/08/20 0947 18     Temp 03/08/20 0947 98.5 F (36.9 C)     Temp Source 03/08/20 0947 Oral     SpO2 03/08/20 0947 95 %     Weight --      Height --      Head Circumference --      Peak Flow --      Pain Score 03/08/20 0948 0     Pain Loc --      Pain Edu? --      Excl. in [place]? --    No data found.  Updated Vital Signs BP 137/76 (BP Location: Right Arm)   Pulse 79   Temp 98.5 F (36.9 C) (Oral)   Resp 18   SpO2 95%   Visual Acuity Right Eye Distance:   Left Eye Distance:   Bilateral Distance:    Right Eye Near:   Left Eye Near:    Bilateral Near:     Physical Exam Vitals and nursing note reviewed.  Constitutional:      General: She is not in acute distress.    Appearance: Normal appearance. She is normal weight. She is not ill-appearing, toxic-appearing or diaphoretic.  Cardiovascular:     Rate and Rhythm: Normal rate and regular rhythm.     Pulses: Normal pulses.     Heart sounds: Normal heart sounds. No murmur. No friction rub. No gallop.   Pulmonary:      Effort: Pulmonary effort is normal. No respiratory distress.     Breath sounds: Normal breath sounds. No stridor. No wheezing, rhonchi or rales.  Chest:     Chest wall: No tenderness.  Abdominal:     General: Abdomen is flat. Bowel sounds are normal. There is no distension.     Palpations: Abdomen is soft. There is no mass.     Tenderness: There is no abdominal tenderness. There is no right CVA tenderness, left CVA tenderness, guarding or rebound.     Hernia: No hernia is present.  Neurological:     Mental Status: She is alert.      UC Treatments / Results  Labs (all labs ordered are listed, but only abnormal results are displayed) Labs Reviewed - No data to display  EKG   Radiology No results found.  Procedures Procedures (including critical care time)  Medications Ordered in UC Medications - No data to display  Initial Impression / Assessment and Plan / UC Course  I have reviewed the triage vital signs and the nursing notes.  Pertinent labs & imaging results that were available during my care of the patient were reviewed by me and considered in my medical decision making (see chart for details).    Patient is stable at discharge.  Symptoms likely from a viral gastroenteritis.  Was advised to follow-up with PCP, and to continue to take Pepto-Bismol as directed.  Declined Bentyl order  Final Clinical Impressions(s) / UC Diagnoses   Final diagnoses:  Viral gastroenteritis     Discharge Instructions     Get rest and drink fluids Continue to take Pepto-Bismol as directed  DIET Instructions:  Increase  your fluid intake to replace losses. Clear liquids only for 24 hours (water, tea, sport drinks, clear flat ginger ale or cola and juices, broth, jello, popsicles, ect). Advance to bland foods, applesauce, rice, baked or boiled chicken, ect. Avoid milk, greasy foods and anything that doesn't agree with you.  If you experience new or worsening symptoms return or go to  ER such as fever, chills, nausea, vomiting, diarrhea, bloody or dark tarry stools, constipation, urinary symptoms, worsening abdominal discomfort, symptoms that do not improve with medications, inability to keep fluids down, etc...  Reviewed expectations re: course of current medical issues. Questions answered. Outlined signs and symptoms indicating need for more acute intervention. Patient verbalized understanding. After Visit Summary given.     ED Prescriptions    None     PDMP not reviewed this encounter.   Durward Parcel, FNP 03/08/20 0959    Durward Parcel, FNP 03/08/20 1000

## 2020-03-08 NOTE — Discharge Instructions (Addendum)
Get rest and drink fluids Continue to take Pepto-Bismol as directed  DIET Instructions:  Increase your fluid intake to replace losses. Clear liquids only for 24 hours (water, tea, sport drinks, clear flat ginger ale or cola and juices, broth, jello, popsicles, ect). Advance to bland foods, applesauce, rice, baked or boiled chicken, ect. Avoid milk, greasy foods and anything that doesn't agree with you.  If you experience new or worsening symptoms return or go to ER such as fever, chills, nausea, vomiting, diarrhea, bloody or dark tarry stools, constipation, urinary symptoms, worsening abdominal discomfort, symptoms that do not improve with medications, inability to keep fluids down, etc...  Reviewed expectations re: course of current medical issues. Questions answered. Outlined signs and symptoms indicating need for more acute intervention. Patient verbalized understanding. After Visit Summary given.

## 2020-03-10 ENCOUNTER — Other Ambulatory Visit: Payer: Self-pay

## 2020-03-10 ENCOUNTER — Ambulatory Visit (HOSPITAL_COMMUNITY)
Admission: RE | Admit: 2020-03-10 | Discharge: 2020-03-10 | Disposition: A | Discharge status: Home / Self Care | Payer: Medicare Other | Source: Ambulatory Visit | Attending: Family Medicine | Admitting: Family Medicine

## 2020-03-10 DIAGNOSIS — M545 Low back pain: Secondary | ICD-10-CM | POA: Diagnosis not present

## 2020-03-10 DIAGNOSIS — M5410 Radiculopathy, site unspecified: Secondary | ICD-10-CM | POA: Insufficient documentation

## 2020-03-10 IMAGING — MR MR LUMBAR SPINE W/O CM
4 of 6 series · 13 of 48 positions shown · non-contrast
Comparison: Radiographs [DATE]. Abdominopelvic CT [DATE].

CLINICAL DATA: Lumbar radiculopathy for more than 6 weeks. Low back
pain radiating into both legs for 2 months. No known injury.

EXAM:
MRI LUMBAR SPINE WITHOUT CONTRAST
TECHNIQUE: Multiplanar, multisequence MR imaging of the lumbar spine was
performed. No intravenous contrast was administered.

[Series 3: T2 · sagittal · 4.0mm · 0.41mm/px · 4 of 15 slices shown (1 of 4)]
[im 1/15]
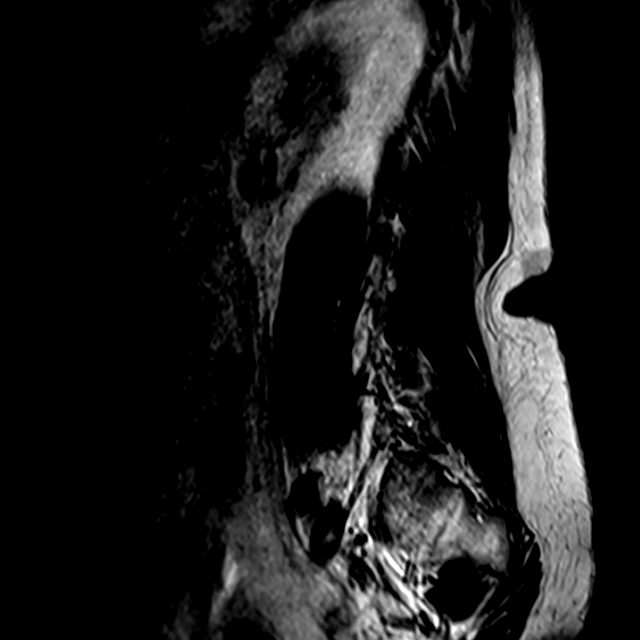
[im 3/15]
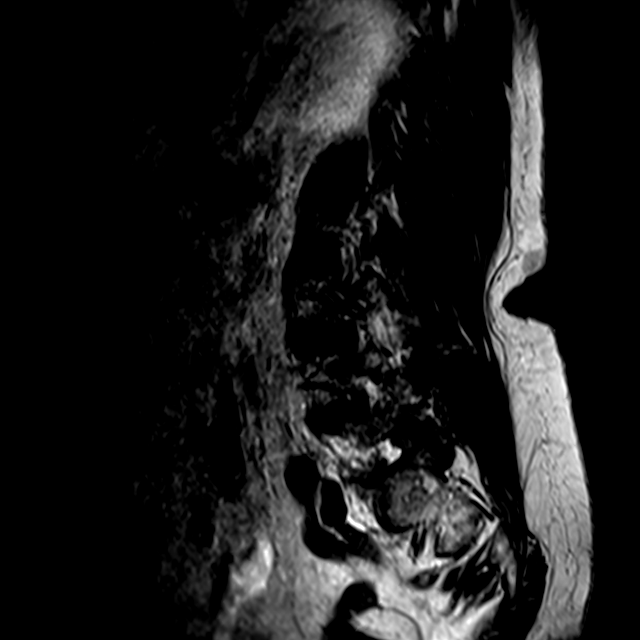
[im 9/15]
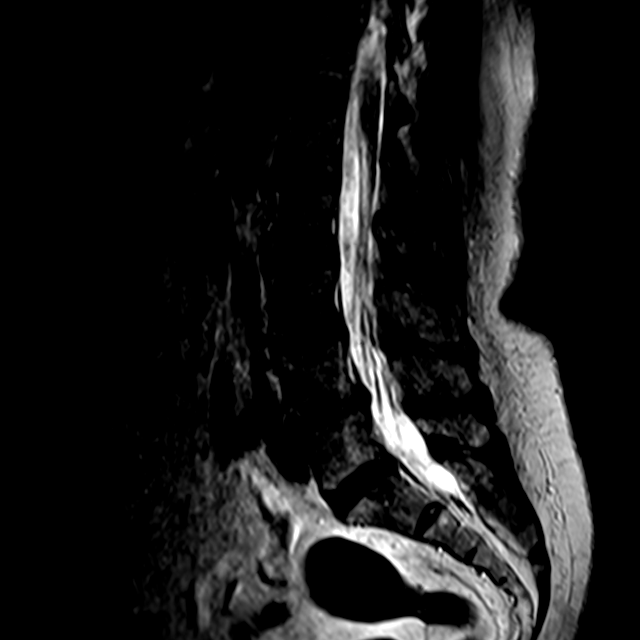
[im 15/15]
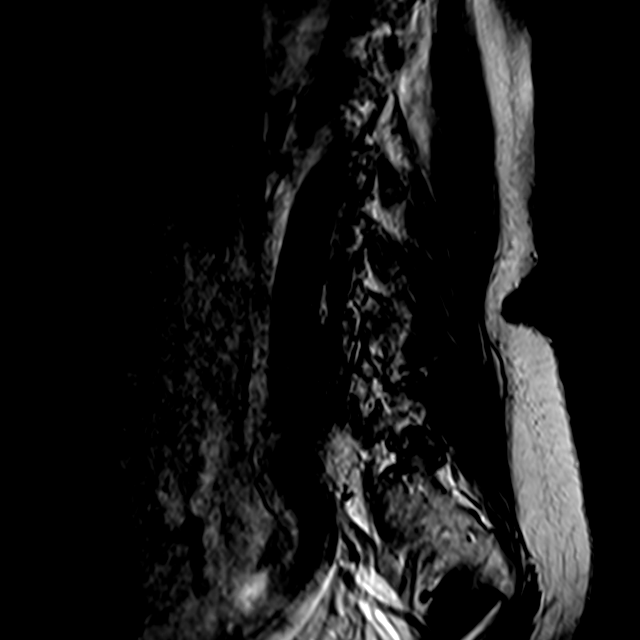

[Series 5: T2 · sagittal · 4.0mm · 0.41mm/px · 3 of 18 slices shown (2 of 4)]
[im 3/18]
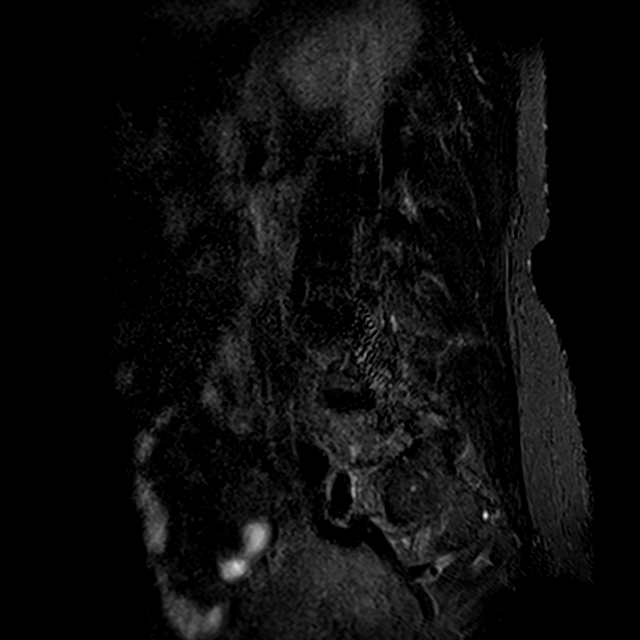
[im 9/18]
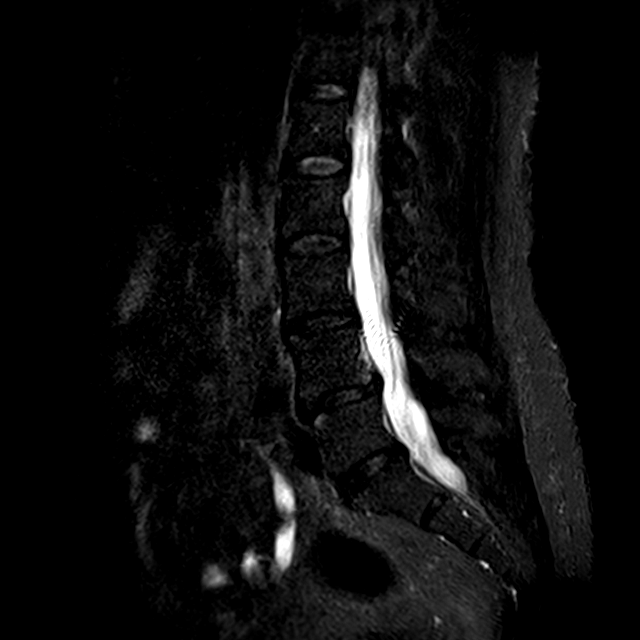
[im 15/18]
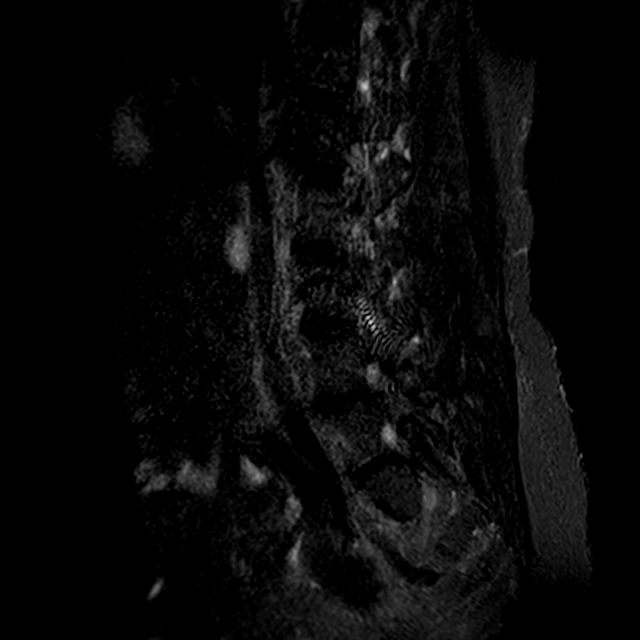

[Series 6: T2 · axial · 4.0mm · 0.25mm/px · z∈[-15,+111]mm · 3 of 33 slices shown (3 of 4)]
[im 6/33]
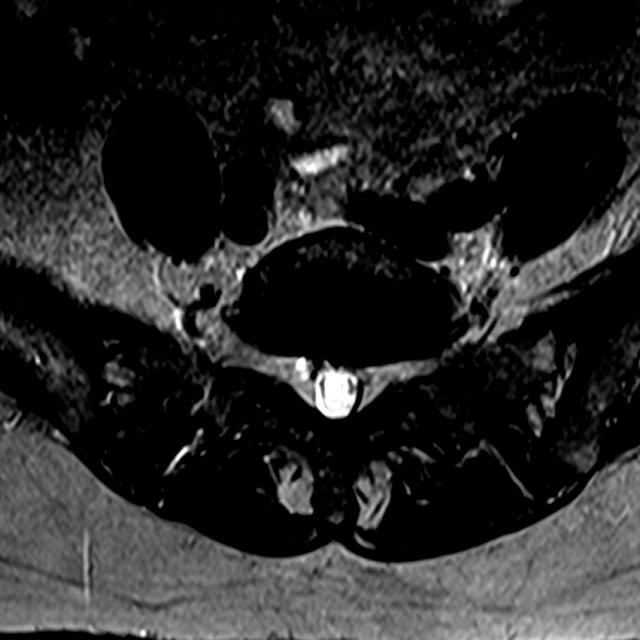
[im 18/33]
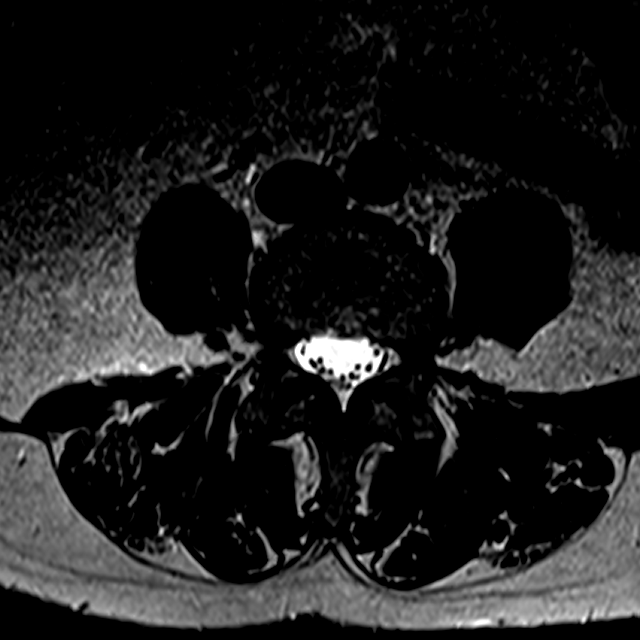
[im 30/33]
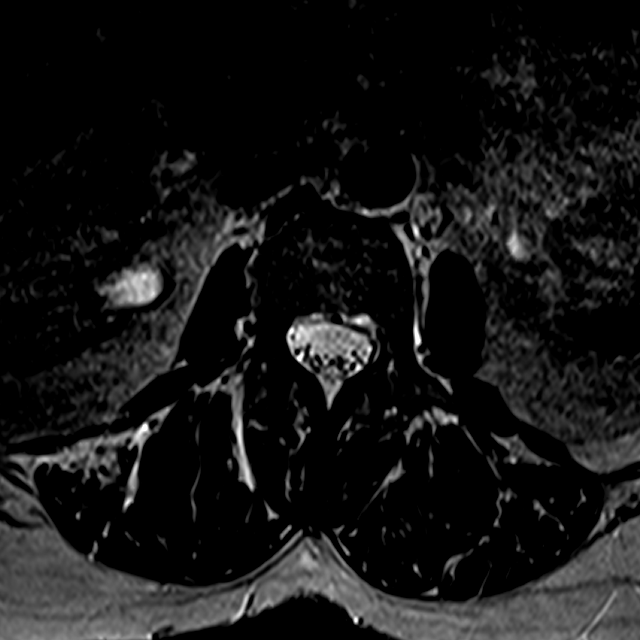

[Series 8: T2 · sagittal · 4.0mm · 0.52mm/px · 3 of 13 slices shown (4 of 4)]
[im 1/13]
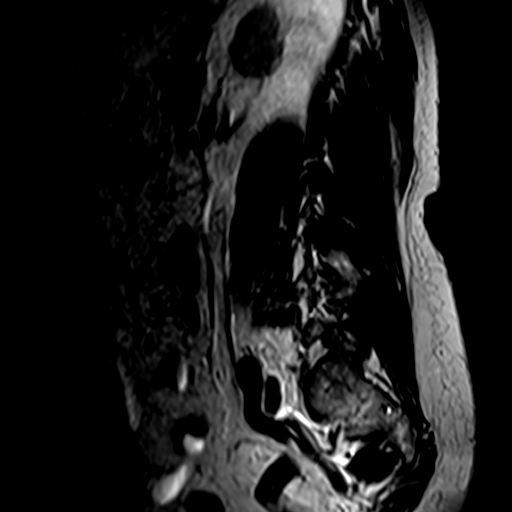
[im 7/13]
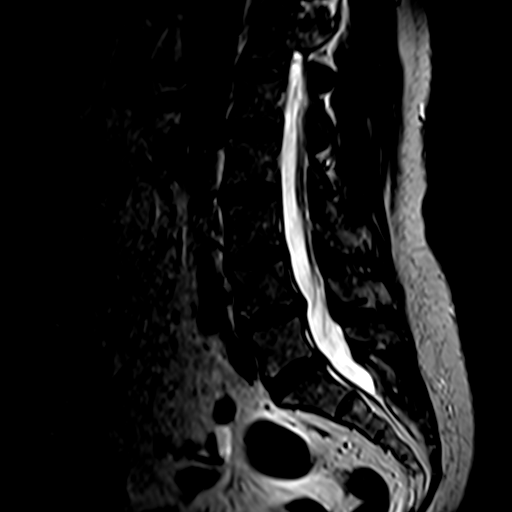
[im 13/13]
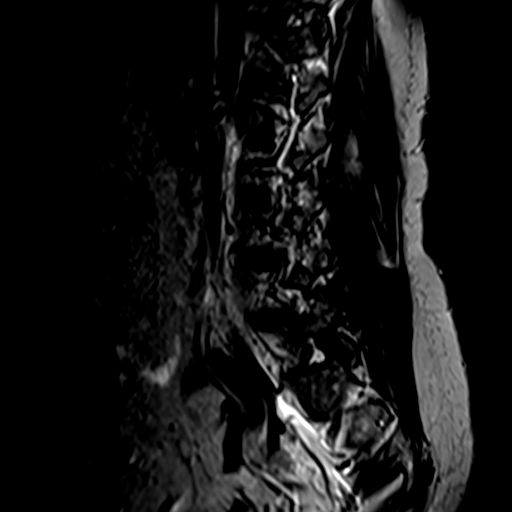

[13 of 48 positions shown; findings below may reference images not displayed]

FINDINGS: Despite efforts by the technologist and patient, mild motion
artifact is present on today's exam and could not be eliminated.
This reduces exam sensitivity and specificity.

Segmentation: Conventional anatomy assumed, with the last open disc
space designated L5-S1.Concordant with previous imaging.

Alignment: Minimal convex left scoliosis. No significant listhesis
or focal angulation.

Vertebrae: No worrisome osseous lesion, acute fracture or pars
defect. Mild endplate degenerative changes asymmetric to the left at
L5-S1. Mild sacroiliac degenerative changes bilaterally.

Conus medullaris: Extends to the L1 level and appears normal.

Paraspinal and other soft tissues: No significant paraspinal
findings.

Disc levels:

No significant disc space findings from T11-12 through L2-3.

L3-4: Minimal disc bulging and mild bilateral facet hypertrophy. No
significant spinal stenosis or nerve root encroachment.

L4-5: Loss of disc height with annular disc bulging and a
broad-based left foraminal disc protrusion, best seen on the
sagittal images. This appears partially calcified on prior CT. This
moderately narrows the left foramen and may contribute to left L4
nerve root encroachment. The right foramen, spinal canal and lateral
recesses are patent. Mild facet and ligamentous hypertrophy.

L5-S1: Mild disc bulging, facet and ligamentous hypertrophy. No
significant spinal stenosis or nerve root encroachment.
IMPRESSION: 1. Broad-based left foraminal disc protrusion at L4-5 moderately
narrows the left foramen and may contribute to left L4 nerve root
encroachment. This appears chronic and partially calcified on [BS]
CT.
2. No other significant spinal stenosis or nerve root encroachment.
3. Mild disc bulging and facet hypertrophy at L3-4 and L5-S1.

## 2020-03-11 ENCOUNTER — Encounter: Payer: Self-pay | Admitting: Family Medicine

## 2020-03-11 ENCOUNTER — Telehealth (INDEPENDENT_AMBULATORY_CARE_PROVIDER_SITE_OTHER): Payer: Medicare Other | Admitting: Family Medicine

## 2020-03-11 VITALS — BP 137/76 | Ht 64.0 in | Wt 183.0 lb

## 2020-03-11 DIAGNOSIS — R232 Flushing: Secondary | ICD-10-CM

## 2020-03-11 DIAGNOSIS — R7301 Impaired fasting glucose: Secondary | ICD-10-CM | POA: Diagnosis not present

## 2020-03-11 DIAGNOSIS — E559 Vitamin D deficiency, unspecified: Secondary | ICD-10-CM | POA: Diagnosis not present

## 2020-03-11 DIAGNOSIS — R55 Syncope and collapse: Secondary | ICD-10-CM | POA: Diagnosis not present

## 2020-03-11 DIAGNOSIS — D539 Nutritional anemia, unspecified: Secondary | ICD-10-CM | POA: Diagnosis not present

## 2020-03-11 NOTE — Assessment & Plan Note (Signed)
Questioning vasovagal response.  Blood pressure has remained stable.  Continues to have these hot flashes even through treatment over the last couple weeks for gastroenteritis.  Is willing to get updated labs for better assessment.  Appropriate treatment will be decided at that time.  Whether or not referral will be initiated was discussed with patient and she is agreeable to plan of care.

## 2020-03-11 NOTE — Assessment & Plan Note (Signed)
Will be getting updated level of vitamin D.  And treat as appropriate

## 2020-03-11 NOTE — Assessment & Plan Note (Signed)
Questioning vasovagal response.  Getting labs, possible referral in near future discussed with patient she is in agreements with this.

## 2020-03-11 NOTE — Progress Notes (Signed)
Virtual Visit via Telephone Note   This visit type was conducted due to national recommendations for restrictions regarding the COVID-19 Pandemic (e.g. social distancing) in an effort to limit this patient's exposure and mitigate transmission in our community.  Due to her co-morbid illnesses, this patient is at least at moderate risk for complications without adequate follow up.  This format is felt to be most appropriate for this patient at this time.  The patient did not have access to video technology/had technical difficulties with video requiring transitioning to audio format only (telephone).  All issues noted in this document were discussed and addressed.  No physical exam could be performed with this format.    Evaluation Performed:  Follow-up visit  Date:  03/11/2020   ID:  Laura [name], DOB [birth year], MRN [id number]  Patient Location: Home Provider Location: Office  Location of Patient: Home Location of Provider: Telehealth Consent was obtain for visit to be over via telehealth. I verified that I am speaking with the correct person using two identifiers.  PCP:  Kerri Perches, MD   Chief Complaint: Follow-up for hot flashes  History of Present Illness:    Laura [name] is a 69 y.o. female with history of recent gastritis has been to the urgent care and emergency room.  Still reports that she is not feeling very well though she is feeling better than she was feeling.  She reports that the Pepto helps some.  She has been trying to stick to a bland/bland diet.  Nausea and diarrhea is better but she still feels weak and she is having these hot like flashes that wake her up all hours of the night.  She had a hysterectomy she thinks her ovaries were removed at that time.  And that was many years ago and she does not believe that this is a normal type a hot flash.  She denies having any excessive dizziness or lightheadedness, but did have a syncopal-like event.  Denies having  any chest pain.  Was worked up in the emergency room for stroke which was ruled out.  Questioning whether or not she has some type of gastric changes, vasovagal changes, estrogen increasing somewhere in her system is open to getting labs and referrals based off of those labs and continued hot flashes if not better in the next week.   The patient does not have symptoms concerning for COVID-19 infection (fever, chills, cough, or new shortness of breath).   Past Medical, Surgical, Social History, Allergies, and Medications have been Reviewed.  Past Medical History:  Diagnosis Date  . Anxiety   . At low risk for fall 08/15/2018  . Cystitis   . Depression   . Gastritis   . GERD (gastroesophageal reflux disease)   . Hashimoto's thyroiditis    Hx   . Hyperlipidemia   . Hypertension   . Hypothyroidism   . Normocytic anemia 2009 Hb 9.9-11.1  . Osteopenia   . Shoulder pain   . Urinary incontinence    Past Surgical History:  Procedure Laterality Date  . Bilateral tubal ligation    . BREAST EXCISIONAL BIOPSY  2010   Excisional biopsy of benign left breast mass -lopoma   . BREAST SURGERY     left nreast biopsy  . COLONOSCOPY  08/26/2011   SLF: 1. Internal hemorrhoids  . COLONOSCOPY N/A 10/06/2015   [facility]:NFAO fissure or internal hemorrhoids/mild sigmoid colitis. benign colonic path   . colonscopy  2005  Dr. Katrinka Blazing  . ESOPHAGOGASTRODUODENOSCOPY  12/2009   chronic gastritis  . ESOPHAGOGASTRODUODENOSCOPY N/A 05/17/2017   Dr. Darrick Penna; gastritis, normal small bowel biopsies, multiple gastric polyps. fundic gland polyps  . GIVENS CAPSULE STUDY N/A 05/30/2017   gastritis, no source for anemia identified  . left knee surgery Left March 1,2017  . PARTIAL KNEE ARTHROPLASTY Left 08/09/2017   Procedure: LEFT UNICOMPARTMENTAL KNEE;  Surgeon: Ollen Gross, MD;  Location: [facility];  Service: Orthopedics;  Laterality: Left;  . Resection of left lobe of thyroid    . right carpal tunnel release    . rt.  knee athroscopy  2004  . SAVORY DILATION N/A 05/17/2017   Procedure: SAVORY DILATION;  Surgeon: West Bali, MD;  Location: [facility];  Service: Endoscopy;  Laterality: N/A;  . TOTAL ABDOMINAL HYSTERECTOMY  1994  . UMBILICAL HERNIA REPAIR    . Urethral dilation for stenosis  2009     Current Meds  Medication Sig  . acetaminophen (TYLENOL) 500 MG tablet Take 1 tablet (500 mg total) by mouth every 6 (six) hours as needed.  Marland Kitchen amLODipine (NORVASC) 2.5 MG tablet TAKE 1 TABLET(2.5 MG) BY MOUTH DAILY  . atorvastatin (LIPITOR) 80 MG tablet Take 1 tablet (80 mg total) by mouth daily.  . citalopram (CELEXA) 20 MG tablet TAKE 1 TABLET BY MOUTH  DAILY  . gabapentin (NEURONTIN) 100 MG capsule Take 1 capsule (100 mg total) by mouth at bedtime.  . hydrochlorothiazide (HYDRODIURIL) 25 MG tablet TAKE 1 TABLET BY MOUTH  DAILY  . hydrOXYzine (ATARAX/VISTARIL) 25 MG tablet Take 1 tablet (25 mg total) by mouth 3 (three) times daily as needed.  Marland Kitchen levothyroxine (SYNTHROID) 112 MCG tablet TAKE 1 TABLET BY MOUTH ONCE DAILY BEFORE BREAKFAST  . linaclotide (LINZESS) 72 MCG capsule Take 1 capsule (72 mcg total) by mouth daily before breakfast.  . loratadine (CLARITIN) 10 MG tablet Take 1 tablet (10 mg total) by mouth daily. (Patient taking differently: Take 10 mg by mouth daily as needed for allergies. )  . meclizine (ANTIVERT) 25 MG tablet Take 1 tablet (25 mg total) by mouth 3 (three) times daily as needed for dizziness.  . pantoprazole (PROTONIX) 40 MG tablet TAKE 1 TABLET BY MOUTH  TWICE DAILY  . Polyethyl Glycol-Propyl Glycol (SYSTANE) 0.4-0.3 % SOLN Apply 1 drop to eye 3 (three) times daily as needed (for dry eyes.).  Marland Kitchen potassium chloride (K-DUR) 10 MEQ tablet Take 1 tablet (10 mEq total) by mouth 2 (two) times daily.  . Probiotic Product (PROBIOTIC ADVANCED PO) Take 1 capsule by mouth daily.  . traMADol (ULTRAM) 50 MG tablet TAKE ONE TABLET BY MOUTH 3 TIMES DAILY AS NEEDED, FOR BACK PAIN  . triamcinolone  cream (KENALOG) 0.1 % Apply twice daily to rash  On right leg for 5 days , then as needed     Allergies:   Cozaar [losartan potassium], Dilaudid [hydromorphone hcl], Levaquin [levofloxacin], Nsaids, and Promethazine   ROS:   Please see the history of present illness.    All other systems reviewed and are negative.   Labs/Other Tests and Data Reviewed:    Recent Labs: 12/31/2019: TSH 3.31 03/03/2020: ALT 13; BUN 6; Creatinine, Ser 0.85; Hemoglobin 11.7; Platelets 262; Potassium 3.3; Sodium 138   Recent Lipid Panel Lab Results  Component Value Date/Time   CHOL 224 (H) 02/10/2020 09:42 AM   CHOL 185 06/21/2017 11:13 AM   TRIG 99 02/10/2020 09:42 AM   HDL 60 02/10/2020 09:42 AM   HDL  46 06/21/2017 11:13 AM   CHOLHDL 3.7 02/10/2020 09:42 AM   LDLCALC 143 (H) 02/10/2020 09:42 AM    Wt Readings from Last 3 Encounters:  03/11/20 183 lb (83 kg)  03/05/20 183 lb (83 kg)  03/03/20 182 lb 15.7 oz (83 kg)     Objective:    Vital Signs:  BP 137/76   Ht 5\' 4"  (0.947 m)   Wt 183 lb (83 kg)   BMI 31.41 kg/m    VITAL SIGNS:  reviewed GEN:  Alert and oriented RESPIRATORY:  No shortness of breath noted in conversation PSYCH:  Normal affect and mood  ASSESSMENT & PLAN:   1. Vitamin D deficiency  - VITAMIN D 25 Hydroxy (Vit-D Deficiency, Fractures)  2. Hot flashes  - Estrogens, total  3. Syncope, unspecified syncope type  - CBC - COMPLETE METABOLIC PANEL WITH GFR - Vitamin B12 - Hemoglobin A1c - Estrogens, total - Magnesium   Time:   Today, I have spent 10 minutes with the patient with telehealth technology discussing the above problems.     Medication Adjustments/Labs and Tests Ordered: Current medicines are reviewed at length with the patient today.  Concerns regarding medicines are outlined above.   Tests Ordered: No orders of the defined types were placed in this encounter.   Medication Changes: No orders of the defined types were placed in this  encounter.   Disposition:  Follow up 05/20/2020 Signed, Perlie Mayo, NP  03/11/2020 8:32 AM     [facility]

## 2020-03-11 NOTE — Patient Instructions (Signed)
I appreciate the opportunity to provide you with care for your health and wellness. Today we discussed: Syncope/hot flashes  Follow up: As scheduled  Labs today No referrals  Please continue to practice social distancing to keep you, your family, and our community safe.  If you must go out, please wear a mask and practice good handwashing.  It was a pleasure to see you and I look forward to continuing to work together on your health and well-being. Please do not hesitate to call the office if you need care or have questions about your care.  Have a wonderful day and week. With Gratitude, Tereasa Coop, DNP, AGNP-BC

## 2020-03-12 ENCOUNTER — Other Ambulatory Visit: Payer: Self-pay

## 2020-03-12 ENCOUNTER — Ambulatory Visit: Payer: Medicare Other | Admitting: Orthopaedic Surgery

## 2020-03-12 ENCOUNTER — Encounter: Payer: Self-pay | Admitting: Orthopaedic Surgery

## 2020-03-12 VITALS — BP 132/84 | HR 76 | Temp 97.0°F | Ht 64.0 in | Wt 177.0 lb

## 2020-03-12 DIAGNOSIS — M545 Low back pain, unspecified: Secondary | ICD-10-CM

## 2020-03-12 DIAGNOSIS — M79605 Pain in left leg: Secondary | ICD-10-CM | POA: Diagnosis not present

## 2020-03-12 MED ORDER — TRAMADOL HCL 50 MG PO TABS: 50.0000 mg | ORAL_TABLET | Freq: Four times a day (QID) | ORAL | 0 refills | Status: AC | PRN

## 2020-03-12 NOTE — Progress Notes (Signed)
Subjective:    Patient ID: Laura [name], female    DOB: Nov 26, 1950, 69 y.o.   MRN: [id number]  HPI She has history of lower back pain that is getting worse.  She has marked pain on the left side which radiates to the left lower leg.  She has difficulty getting any sleep.  She has seen Dr. Lodema Hong and I have reviewed the notes. She had MRI of the lumbar spine which showed: IMPRESSION: 1. Broad-based left foraminal disc protrusion at L4-5 moderately narrows the left foramen and may contribute to left L4 nerve root encroachment. This appears chronic and partially calcified on 2019 CT. 2. No other significant spinal stenosis or nerve root encroachment. 3. Mild disc bulging and facet hypertrophy at L3-4 and L5-S1.  I have independently reviewed the MRI.  I have explained the findings to her.  I will have her seen by neurosurgeon. She is agreeable to this. Review of Systems  Constitutional: Positive for activity change.  Musculoskeletal: Positive for arthralgias and back pain.  All other systems reviewed and are negative.  For Review of Systems, all other systems reviewed and are negative.  The following is a summary of the past history medically, past history surgically, known current medicines, social history and family history.  This information is gathered electronically by the computer from prior information and documentation.  I review this each visit and have found including this information at this point in the chart is beneficial and informative.   Past Medical History:  Diagnosis Date  . Anxiety   . At low risk for fall 08/15/2018  . Cystitis   . Depression   . Gastritis   . GERD (gastroesophageal reflux disease)   . Hashimoto's thyroiditis    Hx   . Hyperlipidemia   . Hypertension   . Hypothyroidism   . Normocytic anemia 2009 Hb 9.9-11.1  . Osteopenia   . Shoulder pain   . Urinary incontinence     Past Surgical History:  Procedure Laterality Date  . Bilateral  tubal ligation    . BREAST EXCISIONAL BIOPSY  2010   Excisional biopsy of benign left breast mass -lopoma   . BREAST SURGERY     left nreast biopsy  . COLONOSCOPY  08/26/2011   SLF: 1. Internal hemorrhoids  . COLONOSCOPY N/A 10/06/2015   [facility]:URKY fissure or internal hemorrhoids/mild sigmoid colitis. benign colonic path   . colonscopy  2005   Dr. Katrinka Blazing  . ESOPHAGOGASTRODUODENOSCOPY  12/2009   chronic gastritis  . ESOPHAGOGASTRODUODENOSCOPY N/A 05/17/2017   Dr. Darrick Penna; gastritis, normal small bowel biopsies, multiple gastric polyps. fundic gland polyps  . GIVENS CAPSULE STUDY N/A 05/30/2017   gastritis, no source for anemia identified  . left knee surgery Left March 1,2017  . PARTIAL KNEE ARTHROPLASTY Left 08/09/2017   Procedure: LEFT UNICOMPARTMENTAL KNEE;  Surgeon: Ollen Gross, MD;  Location: [facility];  Service: Orthopedics;  Laterality: Left;  . Resection of left lobe of thyroid    . right carpal tunnel release    . rt. knee athroscopy  2004  . SAVORY DILATION N/A 05/17/2017   Procedure: SAVORY DILATION;  Surgeon: West Bali, MD;  Location: [facility];  Service: Endoscopy;  Laterality: N/A;  . TOTAL ABDOMINAL HYSTERECTOMY  1994  . UMBILICAL HERNIA REPAIR    . Urethral dilation for stenosis  2009    Current Outpatient Medications on File Prior to Visit  Medication Sig Dispense Refill  . acetaminophen (TYLENOL) 500 MG tablet Take  1 tablet (500 mg total) by mouth every 6 (six) hours as needed. 30 tablet 0  . amLODipine (NORVASC) 2.5 MG tablet TAKE 1 TABLET(2.5 MG) BY MOUTH DAILY 30 tablet 5  . atorvastatin (LIPITOR) 80 MG tablet Take 1 tablet (80 mg total) by mouth daily. 90 tablet 3  . citalopram (CELEXA) 20 MG tablet TAKE 1 TABLET BY MOUTH  DAILY 90 tablet 3  . gabapentin (NEURONTIN) 100 MG capsule Take 1 capsule (100 mg total) by mouth at bedtime. 90 capsule 3  . hydrochlorothiazide (HYDRODIURIL) 25 MG tablet TAKE 1 TABLET BY MOUTH  DAILY 90 tablet 3  . hydrOXYzine  (ATARAX/VISTARIL) 25 MG tablet Take 1 tablet (25 mg total) by mouth 3 (three) times daily as needed. 30 tablet 0  . levothyroxine (SYNTHROID) 112 MCG tablet TAKE 1 TABLET BY MOUTH ONCE DAILY BEFORE BREAKFAST 90 tablet 0  . linaclotide (LINZESS) 72 MCG capsule Take 1 capsule (72 mcg total) by mouth daily before breakfast. 90 capsule 3  . loratadine (CLARITIN) 10 MG tablet Take 1 tablet (10 mg total) by mouth daily. (Patient taking differently: Take 10 mg by mouth daily as needed for allergies. ) 30 tablet 3  . meclizine (ANTIVERT) 25 MG tablet Take 1 tablet (25 mg total) by mouth 3 (three) times daily as needed for dizziness. 15 tablet 0  . pantoprazole (PROTONIX) 40 MG tablet TAKE 1 TABLET BY MOUTH  TWICE DAILY 180 tablet 3  . Polyethyl Glycol-Propyl Glycol (SYSTANE) 0.4-0.3 % SOLN Apply 1 drop to eye 3 (three) times daily as needed (for dry eyes.).    Marland Kitchen potassium chloride (K-DUR) 10 MEQ tablet Take 1 tablet (10 mEq total) by mouth 2 (two) times daily. 60 tablet 2  . Probiotic Product (PROBIOTIC ADVANCED PO) Take 1 capsule by mouth daily.    Marland Kitchen triamcinolone cream (KENALOG) 0.1 % Apply twice daily to rash  On right leg for 5 days , then as needed 45 g 0   No current facility-administered medications on file prior to visit.    Social History   Socioeconomic History  . Marital status: Married    Spouse name: Not on file  . Number of children: 3  . Years of education: Not on file  . Highest education level: Not on file  Occupational History  . Occupation: Production manager producing automobile parts   Tobacco Use  . Smoking status: Former Smoker    Packs/day: 0.25    Years: 1.00    Pack years: 0.25    Types: Cigarettes    Quit date: 08/25/1972    Years since quitting: 47.5  . Smokeless tobacco: Never Used  Substance and Sexual Activity  . Alcohol use: No  . Drug use: No  . Sexual activity: Yes    Birth control/protection: Surgical    Comment: married  Other Topics Concern  . Not on  file  Social History Narrative  . Not on file   Social Determinants of Health   Financial Resource Strain:   . Difficulty of Paying Living Expenses:   Food Insecurity:   . Worried About Charity fundraiser in the Last Year:   . Arboriculturist in the Last Year:   Transportation Needs:   . Film/video editor (Medical):   Marland Kitchen Lack of Transportation (Non-Medical):   Physical Activity:   . Days of Exercise per Week:   . Minutes of Exercise per Session:   Stress:   . Feeling of Stress :  Social Connections:   . Frequency of Communication with Friends and Family:   . Frequency of Social Gatherings with Friends and Family:   . Attends Religious Services:   . Active Member of Clubs or Organizations:   . Attends Banker Meetings:   Marland Kitchen Marital Status:   Intimate Partner Violence:   . Fear of Current or Ex-Partner:   . Emotionally Abused:   Marland Kitchen Physically Abused:   . Sexually Abused:     Family History  Problem Relation Age of Onset  . Pneumonia Mother   . Healthy Father   . Colon cancer Neg Hx   . Anesthesia problems Neg Hx   . Hypotension Neg Hx   . Malignant hyperthermia Neg Hx   . Pseudochol deficiency Neg Hx     BP 132/84   Pulse 76   Temp (!) 97 F (36.1 C)   Ht 5\' 4"  (1.626 m)   Wt 177 lb (80.3 kg)   BMI 30.38 kg/m   Body mass index is 30.38 kg/m.     Objective:   Physical Exam Vitals and nursing note reviewed.  Constitutional:      Appearance: She is well-developed.  HENT:     Head: Normocephalic and atraumatic.  Eyes:     Conjunctiva/sclera: Conjunctivae normal.     Pupils: Pupils are equal, round, and reactive to light.  Cardiovascular:     Rate and Rhythm: Normal rate and regular rhythm.  Pulmonary:     Effort: Pulmonary effort is normal.  Abdominal:     Palpations: Abdomen is soft.  Musculoskeletal:       Arms:     Cervical back: Normal range of motion and neck supple.  Skin:    General: Skin is warm and dry.  Neurological:       Mental Status: She is alert and oriented to person, place, and time.     Cranial Nerves: No cranial nerve deficit.     Motor: No abnormal muscle tone.     Coordination: Coordination normal.     Deep Tendon Reflexes: Reflexes are normal and symmetric. Reflexes normal.  Psychiatric:        Behavior: Behavior normal.        Thought Content: Thought content normal.        Judgment: Judgment normal.           Assessment & Plan:   Encounter Diagnosis  Name Primary?  . Lumbar pain with radiation down left leg Yes   To neurosurgery.  I will refill her pain medicine.  I have reviewed the Controlled Substance Reporting System web site prior to prescribing narcotic medicine for this patient.   Electronically Signed [place], MD 5/13/202111:48 AM

## 2020-03-16 LAB — COMPLETE METABOLIC PANEL WITH GFR
AG Ratio: 1.4 (calc) (ref 1.0–2.5)
ALT: 14 U/L (ref 6–29)
AST: 16 U/L (ref 10–35)
Albumin: 4.2 g/dL (ref 3.6–5.1)
Alkaline phosphatase (APISO): 60 U/L (ref 37–153)
BUN: 9 mg/dL (ref 7–25)
CO2: 31 mmol/L (ref 20–32)
Calcium: 9.9 mg/dL (ref 8.6–10.4)
Chloride: 99 mmol/L (ref 98–110)
Creat: 0.95 mg/dL (ref 0.50–0.99)
GFR, Est African American: 71 mL/min/{1.73_m2} (ref >=60)
GFR, Est Non African American: 61 mL/min/{1.73_m2} (ref >=60)
Globulin: 2.9 g/dL (calc) (ref 1.9–3.7)
Glucose, Bld: 107 mg/dL — ABNORMAL HIGH (ref 65–99)
Potassium: 3.6 mmol/L (ref 3.5–5.3)
Sodium: 138 mmol/L (ref 135–146)
Total Bilirubin: 0.6 mg/dL (ref 0.2–1.2)
Total Protein: 7.1 g/dL (ref 6.1–8.1)

## 2020-03-16 LAB — HEMOGLOBIN A1C
Hgb A1c MFr Bld: 5.2 % of total Hgb (ref <5.7)
Mean Plasma Glucose: 103 (calc)
eAG (mmol/L): 5.7 (calc)

## 2020-03-16 LAB — CBC
HCT: 35.8 % (ref 35.0–45.0)
Hemoglobin: 11.4 g/dL — ABNORMAL LOW (ref 11.7–15.5)
MCH: 28.1 pg (ref 27.0–33.0)
MCHC: 31.8 g/dL — ABNORMAL LOW (ref 32.0–36.0)
MCV: 88.2 fL (ref 80.0–100.0)
MPV: 9.9 fL (ref 7.5–12.5)
Platelets: 279 10*3/uL (ref 140–400)
RBC: 4.06 10*6/uL (ref 3.80–5.10)
RDW: 11.3 % (ref 11.0–15.0)
WBC: 3.6 10*3/uL — ABNORMAL LOW (ref 3.8–10.8)

## 2020-03-16 LAB — MAGNESIUM: Magnesium: 1.8 mg/dL (ref 1.5–2.5)

## 2020-03-16 LAB — VITAMIN D 25 HYDROXY (VIT D DEFICIENCY, FRACTURES): Vit D, 25-Hydroxy: 66 ng/mL (ref 30–100)

## 2020-03-16 LAB — ESTROGENS, TOTAL: Estrogen: 135.6 pg/mL

## 2020-03-16 LAB — VITAMIN B12: Vitamin B-12: 1175 pg/mL — ABNORMAL HIGH (ref 200–1100)

## 2020-03-17 ENCOUNTER — Telehealth: Payer: Self-pay

## 2020-03-17 NOTE — Telephone Encounter (Signed)
FYI:   Per call from [place] at [facility], their office contacted patient, and she declined appt for neurosurgery consult-said not interested in that; relayed she would like to be seen for pain injections. Her referral is being reviewed under their pain management side; if approved, appointment information will be faxed.to our office.

## 2020-03-21 ENCOUNTER — Other Ambulatory Visit: Payer: Self-pay | Admitting: Family Medicine

## 2020-03-24 ENCOUNTER — Telehealth: Payer: Self-pay | Admitting: Gastroenterology

## 2020-03-24 NOTE — Telephone Encounter (Signed)
PLEASE CALL PATIENT, SAID HER ABDOMEN IS WARM IN THE MORNINGS FROM INFLAMMATION.  TOLD HER I WOULD HAVE A NURSE CALL HER

## 2020-03-24 NOTE — Telephone Encounter (Signed)
Called notified pt of providers recommendations, pt stated she understood and that she would increase her medication And would call us if abd pain persist

## 2020-03-24 NOTE — Telephone Encounter (Signed)
Called pt verified name and dob Began on 5/1 went to er they evaluated you then pt was discharged  Patient states she has had a fever Has tried protonix and colon health  Abnormal bowel movements pt d/c iron 2 days ago because of constipation Pt stated she will start taking iron today Normal bm this morning  Center abd pain. Goes all the way up to her mouth and pt states her mouth is dry Drinks about 2 bottles of water a day

## 2020-03-24 NOTE — Telephone Encounter (Signed)
If persistent fever, see PCP. Doesn't sound like having diarrhea. I would increase Protonix to twice a day for the next 2 weeks, then back to once daily. If persistent abdominal pain, call us. I reviewed recent labs from May 2021. LFTs normal. hgb stable.

## 2020-03-25 ENCOUNTER — Other Ambulatory Visit: Payer: Self-pay

## 2020-03-25 ENCOUNTER — Encounter: Payer: Self-pay | Admitting: Family Medicine

## 2020-03-25 DIAGNOSIS — A084 Viral intestinal infection, unspecified: Secondary | ICD-10-CM

## 2020-03-25 DIAGNOSIS — K30 Functional dyspepsia: Secondary | ICD-10-CM

## 2020-03-25 NOTE — Telephone Encounter (Signed)
Please look at who did her colonoscopy and lets refer back for stomach pain.

## 2020-03-26 DIAGNOSIS — M5440 Lumbago with sciatica, unspecified side: Secondary | ICD-10-CM | POA: Diagnosis not present

## 2020-03-27 ENCOUNTER — Telehealth: Payer: Self-pay | Admitting: Gastroenterology

## 2020-03-27 DIAGNOSIS — R109 Unspecified abdominal pain: Secondary | ICD-10-CM

## 2020-03-27 NOTE — Telephone Encounter (Signed)
[facility] clinical pool:  Please arrange CT abd/pelvis with contrast today or at early next week. We need to get her in for an appt thereafter, first available. Diagnosis: abdominal pain. I am not sure insurance will cover as I last saw in Jan 2021, but this is a recurrent issue going on.

## 2020-03-31 NOTE — Addendum Note (Signed)
Addended by: Armstead Peaks on: 03/31/2020 08:35 AM   Modules accepted: Orders

## 2020-03-31 NOTE — Telephone Encounter (Signed)
CT scheduled for 6/2 at 4:30pm, p/u oral contrast npo 4 hrs  Called pt and she states she got the appt on her mychart. She will p/u oral contrast from [facility] today

## 2020-03-31 NOTE — Telephone Encounter (Signed)
Checked [employer] and no PA is required for CT A/P.   Called [facility] to schedule and they are going to have to call me back to schedule

## 2020-04-01 ENCOUNTER — Other Ambulatory Visit: Payer: Self-pay

## 2020-04-01 ENCOUNTER — Ambulatory Visit (HOSPITAL_COMMUNITY)
Admission: RE | Admit: 2020-04-01 | Discharge: 2020-04-01 | Disposition: A | Discharge status: Home / Self Care | Payer: Medicare Other | Source: Ambulatory Visit | Attending: Gastroenterology | Admitting: Gastroenterology

## 2020-04-01 DIAGNOSIS — R109 Unspecified abdominal pain: Secondary | ICD-10-CM | POA: Insufficient documentation

## 2020-04-01 IMAGING — CT CT ABD-PELV W/ CM
2 of 5 series · 15 of 46 positions shown, 17 images · IV contrast (Omnipaque or Isovue)
Comparison: [DATE]

CLINICAL DATA: Abdominal pain since [REDACTED], radiating from pelvis to
chest, history of hiatal hernia repair

EXAM:
CT ABDOMEN AND PELVIS WITH CONTRAST
TECHNIQUE: Multidetector CT imaging of the abdomen and pelvis was performed
using the standard protocol following bolus administration of
intravenous contrast.
CONTRAST:  100mL OMNIPAQUE IOHEXOL 300 MG/ML SOLN, additional oral
enteric contrast

[Series 2: axial st · axial · 0.70mm/px · z∈[-310,+115]mm · 12 of 95 slices shown, 14 images]
[im 5/95  soft-tissue]
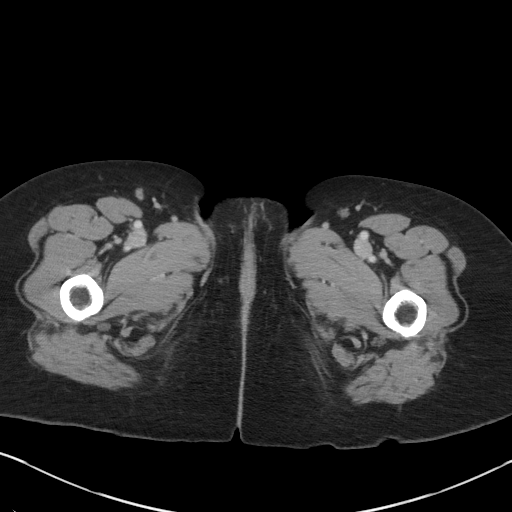
[im 5/95  bone]
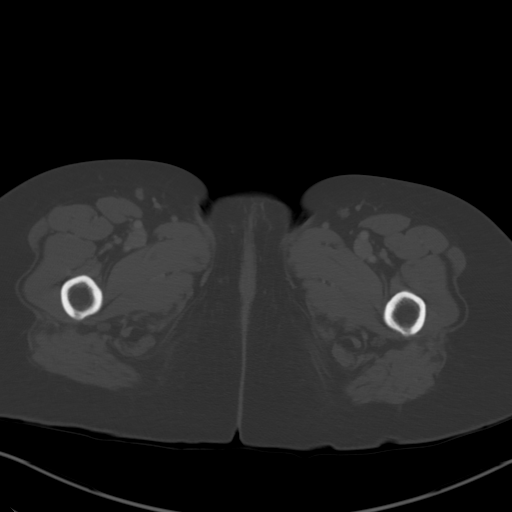
[im 15/95  soft-tissue]
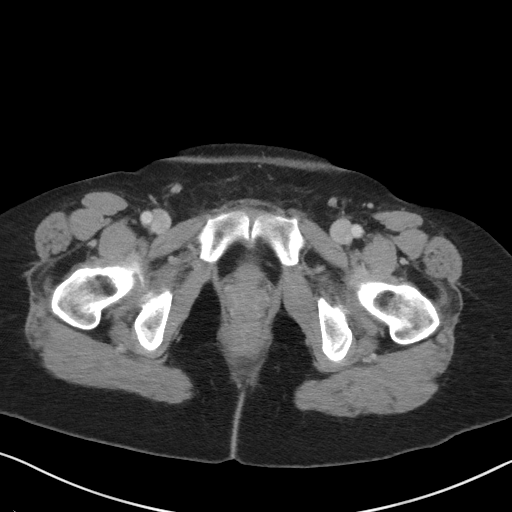
[im 20/95  soft-tissue]
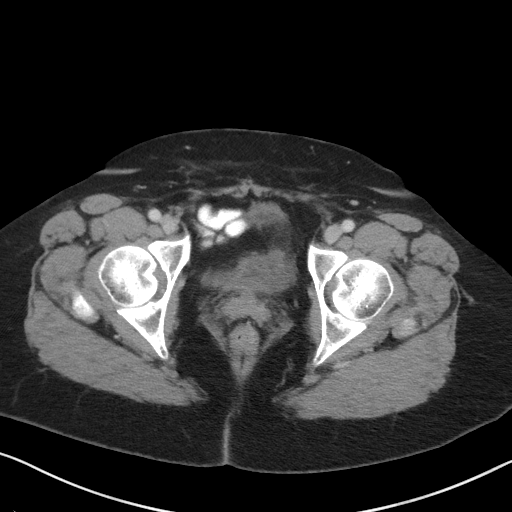
[im 30/95  soft-tissue]
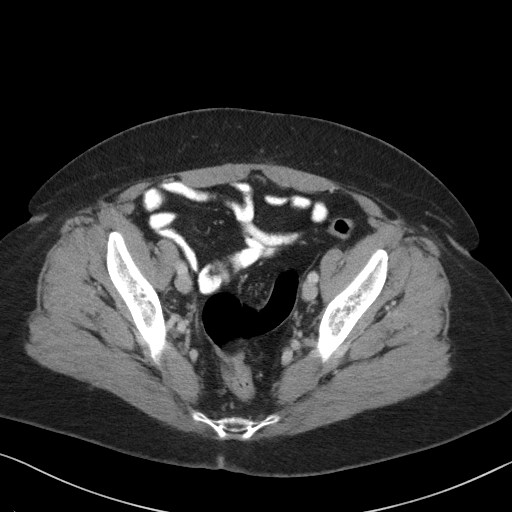
[im 35/95  soft-tissue]
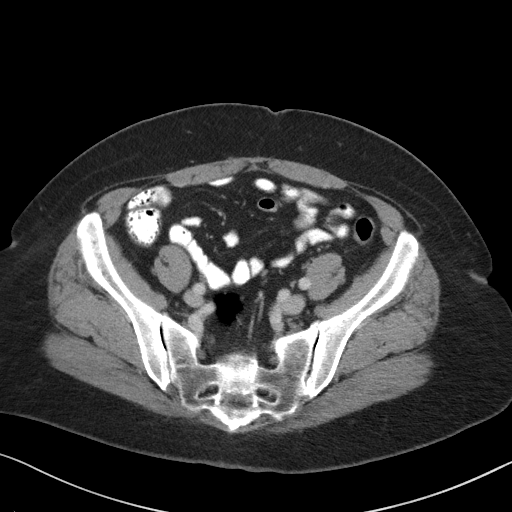
[im 45/95  soft-tissue]
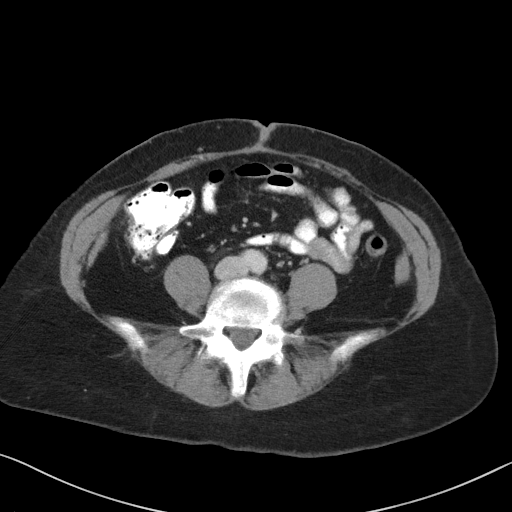
[im 50/95  soft-tissue]
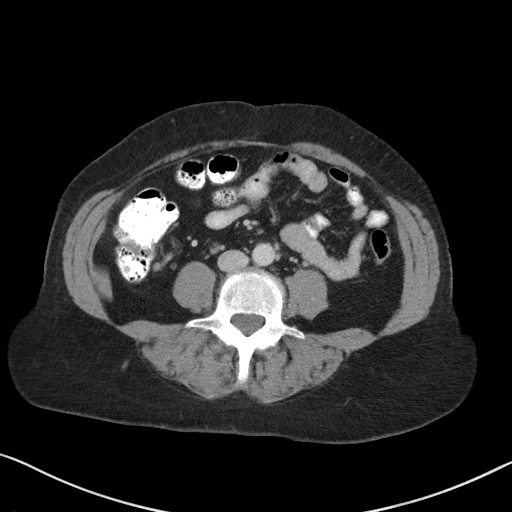
[im 60/95  soft-tissue]
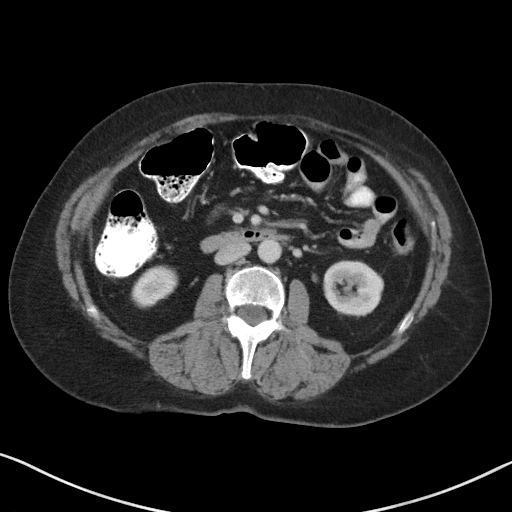
[im 65/95  soft-tissue]
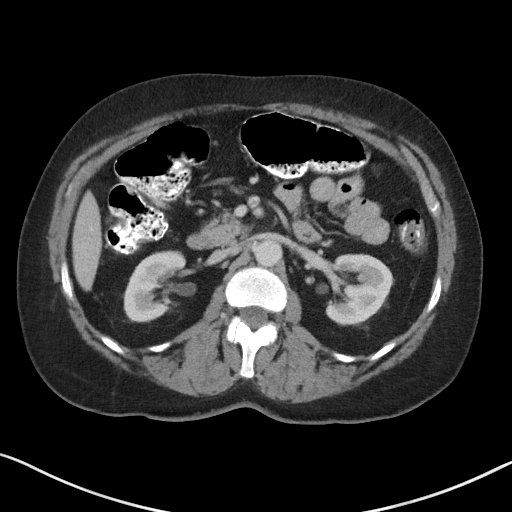
[im 65/95  bone]
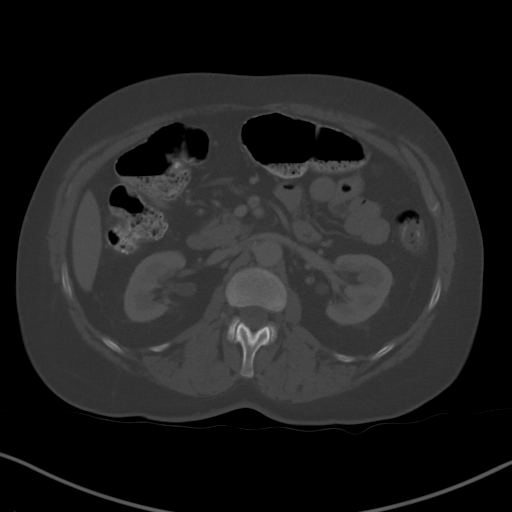
[im 75/95  soft-tissue]
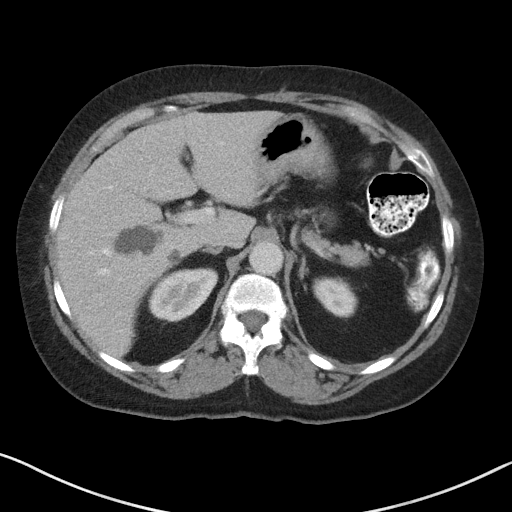
[im 80/95  soft-tissue]
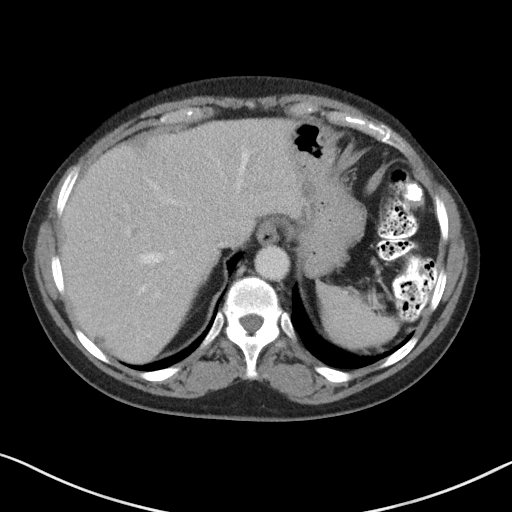
[im 90/95  soft-tissue]
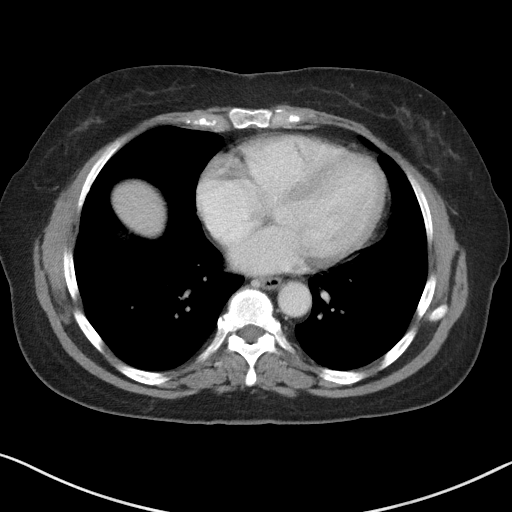

[Series 6: coronal st · coronal · 0.65mm/px · 3 of 91 slices shown]
[im 31/91  soft-tissue]
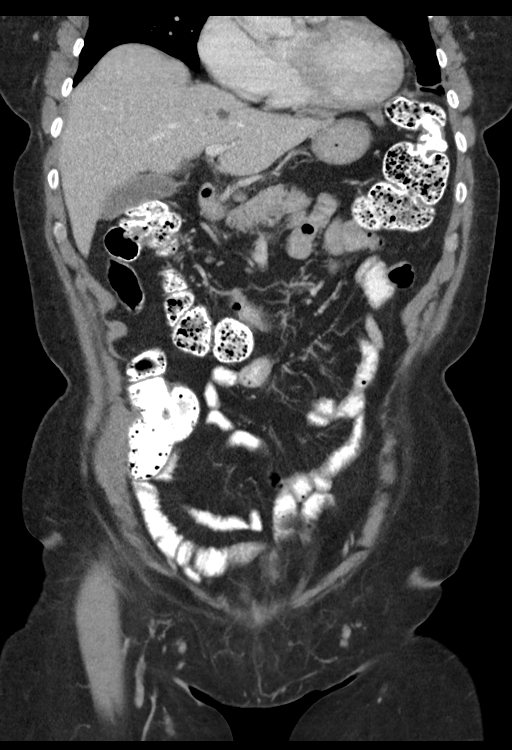
[im 41/91  soft-tissue]
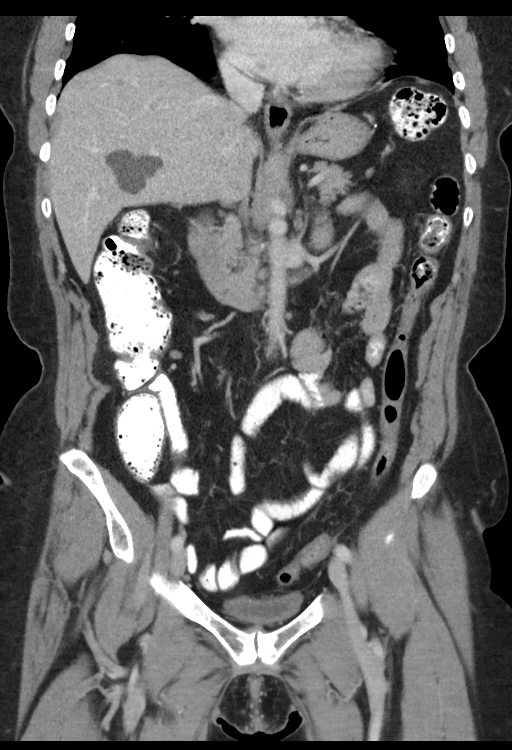
[im 51/91  soft-tissue]
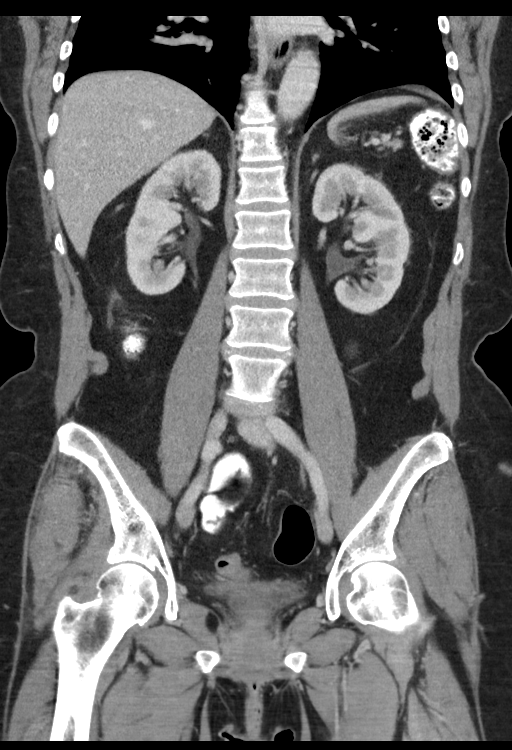

[15 of 46 positions shown; findings below may reference images not displayed]

FINDINGS: Lower chest: No acute abnormality.  Coronary artery calcifications

Hepatobiliary: No solid liver abnormality is seen. Numerous
low-attenuation lesions of the liver, largest of which are clearly
cysts or hemangiomata, others too small to characterize although
likely subcentimeter cysts and not significantly changed compared to
prior examination dated [QG]. No gallstones, gallbladder wall
thickening, or biliary dilatation.

Pancreas: Unremarkable. No pancreatic ductal dilatation or
surrounding inflammatory changes.

Spleen: Normal in size without significant abnormality.

Adrenals/Urinary Tract: Adrenal glands are unremarkable. Kidneys are
normal, without renal calculi, solid lesion, or hydronephrosis.
Bladder is unremarkable.

Stomach/Bowel: Stomach is within normal limits. Appendix is not
clearly visualized and may be surgically absent. No evidence of
bowel wall thickening, distention, or inflammatory changes.

Vascular/Lymphatic: No significant vascular findings are present. No
enlarged abdominal or pelvic lymph nodes.

Reproductive: Status post hysterectomy.

Other: No abdominal wall hernia or abnormality. No abdominopelvic
ascites.

Musculoskeletal: No acute or significant osseous findings.
IMPRESSION: 1. No acute CT findings of the abdomen or pelvis to explain pain.
2. Status post hysterectomy.
3. No evidence of recurrent hiatal hernia.
4. Coronary artery disease.

## 2020-04-01 MED ORDER — IOHEXOL 300 MG/ML  SOLN
100.0000 mL | Freq: Once | INTRAMUSCULAR | Status: AC | PRN
  Administered 2020-04-01: 100 mL via INTRAVENOUS

## 2020-04-02 ENCOUNTER — Ambulatory Visit (INDEPENDENT_AMBULATORY_CARE_PROVIDER_SITE_OTHER): Payer: Medicare Other | Admitting: Family Medicine

## 2020-04-02 ENCOUNTER — Encounter: Payer: Self-pay | Admitting: Family Medicine

## 2020-04-02 VITALS — BP 120/80 | HR 68 | Temp 97.4°F | Resp 16 | Ht 64.0 in | Wt 175.0 lb

## 2020-04-02 DIAGNOSIS — R232 Flushing: Secondary | ICD-10-CM

## 2020-04-02 DIAGNOSIS — R6889 Other general symptoms and signs: Secondary | ICD-10-CM

## 2020-04-02 DIAGNOSIS — I10 Essential (primary) hypertension: Secondary | ICD-10-CM | POA: Diagnosis not present

## 2020-04-02 DIAGNOSIS — E669 Obesity, unspecified: Secondary | ICD-10-CM

## 2020-04-02 DIAGNOSIS — E785 Hyperlipidemia, unspecified: Secondary | ICD-10-CM

## 2020-04-02 DIAGNOSIS — D649 Anemia, unspecified: Secondary | ICD-10-CM | POA: Diagnosis not present

## 2020-04-02 DIAGNOSIS — M5410 Radiculopathy, site unspecified: Secondary | ICD-10-CM

## 2020-04-02 DIAGNOSIS — E66811 Obesity, class 1: Secondary | ICD-10-CM

## 2020-04-02 NOTE — Patient Instructions (Addendum)
F/U in end August , call if you need me before   Need to get iron , ferritin, and fasting lipid panel , cmp and EgFR 1 week before visit We will establish appointment information for you with Neurosurgeon that was sent in May, call back early next week to follow up on this if you do not have an appointment by then    It is important that you exercise regularly at least 30 minutes 5 times a week. If you develop chest pain, have severe difficulty breathing, or feel very tired, stop exercising immediately and seek medical attention   Thanks for choosing [facility], we consider it a privelige to serve you.

## 2020-04-06 ENCOUNTER — Encounter: Payer: Self-pay | Admitting: Family Medicine

## 2020-04-06 NOTE — Assessment & Plan Note (Signed)
  Patient re-educated about  the importance of commitment to a  minimum of 150 minutes of exercise per week as able.  The importance of healthy food choices with portion control discussed, as well as eating regularly and within a 12 hour window most days. The need to choose "clean , green" food 50 to 75% of the time is discussed, as well as to make water the primary drink and set a goal of 64 ounces water daily.    Weight /BMI 04/02/2020 03/12/2020 03/11/2020  WEIGHT 175 lb 177 lb 183 lb  HEIGHT 5\' 4"  5\' 4"  5\' 4"   BMI 30.04 kg/m2 30.38 kg/m2 31.41 kg/m2

## 2020-04-06 NOTE — Progress Notes (Signed)
   Laura [name]     MRN: [id number]      DOB: [birth year]   HPI Laura [name] is here for follow up reports that since March, 2021 she has " been going through" health challenges that have taken her to urgent care, office visits, and she still has symptoms of abnormal sensation from abdomen to chest and at times down her legs, mainly at night , which she describes a a hot sensation Recent abdominal scan shows no pathology Recent MRI lumbar describes arthritis and disc disease which I believe are the culprits, she is still awaiting Neurosurgery eval and is anxious about this ROS Denies recent fever or chills. Denies sinus pressure, nasal congestion, ear pain or sore throat. Denies chest congestion, productive cough or wheezing. Denies chest pains, palpitations and leg swelling Denies  nausea, vomiting,diarrhea or constipation.   Denies dysuria, frequency, hesitancy or incontinence.  . Denies uncontrolled  depression,  Has increased anxiety or insomnia. Denies skin break down or rash.   PE  BP 120/80   Pulse 68   Temp (!) 97.4 F (36.3 C) (Temporal)   Resp 16   Ht 5\' 4"  (1.626 m)   Wt 175 lb (79.4 kg)   SpO2 98%   BMI 30.04 kg/m   Patient alert and oriented and in no cardiopulmonary distress.  HEENT: No facial asymmetry, EOMI,     Neck supple .  Chest: Clear to auscultation bilaterally.  CVS: S1, S2 no murmurs, no S3.Regular rate.  ABD: Soft non tender.   Ext: No edema  MS: Adequate though reduced  ROM spine,normal in  shoulders, hips and knees.  Skin: Intact, no ulcerations or rash noted.  Psych: Good eye contact, normal affect. Memory intact  anxious not depressed appearing.  CNS: CN 2-12 intact, power,  normal throughout.no focal deficits noted.   Assessment & Plan  Essential hypertension Controlled, no change in medication   Back pain with radiculopathy Uncontrolled with abnormal sensations from abdomen to chest, needs neurosurgery evaluation  Sensation of  feeling hot Main abnormality is arthritis in spine , needs evaluation and management by neurosurgery  Hot flashes Currently on SSRI , may change to venlafexine if still has complaint after neurosurgery eval  Obesity (BMI 30.0-34.9)  Patient re-educated about  the importance of commitment to a  minimum of 150 minutes of exercise per week as able.  The importance of healthy food choices with portion control discussed, as well as eating regularly and within a 12 hour window most days. The need to choose "clean , green" food 50 to 75% of the time is discussed, as well as to make water the primary drink and set a goal of 64 ounces water daily.    Weight /BMI 04/02/2020 03/12/2020 03/11/2020  WEIGHT 175 lb 177 lb 183 lb  HEIGHT 5\' 4"  5\' 4"  5\' 4"   BMI 30.04 kg/m2 30.38 kg/m2 31.41 kg/m2      Hyperlipidemia LDL goal <100 Hyperlipidemia:Low fat diet discussed and encouraged.   Lipid Panel  Lab Results  Component Value Date   CHOL 224 (H) 02/10/2020   HDL 60 02/10/2020   LDLCALC 143 (H) 02/10/2020   TRIG 99 02/10/2020   CHOLHDL 3.7 02/10/2020   Uncontrolled , updated lab in next 3 months

## 2020-04-06 NOTE — Assessment & Plan Note (Signed)
Controlled, no change in medication  

## 2020-04-06 NOTE — Assessment & Plan Note (Signed)
Hyperlipidemia:Low fat diet discussed and encouraged.   Lipid Panel  Lab Results  Component Value Date   CHOL 224 (H) 02/10/2020   HDL 60 02/10/2020   LDLCALC 143 (H) 02/10/2020   TRIG 99 02/10/2020   CHOLHDL 3.7 02/10/2020   Uncontrolled , updated lab in next 3 months

## 2020-04-06 NOTE — Assessment & Plan Note (Signed)
Currently on SSRI , may change to venlafexine if still has complaint after neurosurgery eval

## 2020-04-06 NOTE — Assessment & Plan Note (Signed)
Main abnormality is arthritis in spine , needs evaluation and management by neurosurgery

## 2020-04-06 NOTE — Assessment & Plan Note (Signed)
Uncontrolled with abnormal sensations from abdomen to chest, needs neurosurgery evaluation

## 2020-04-13 ENCOUNTER — Telehealth: Payer: Self-pay

## 2020-04-13 NOTE — Telephone Encounter (Signed)
Per betsy pt has an appt 04-14-20 with Tobi Bastos  and pt is aware

## 2020-04-13 NOTE — Telephone Encounter (Signed)
FYI Received a call from [facility] (Dr. Anthony Sar office), Dr. Lodema Hong wants pt seen sooner than 05/01/20. C/o burning in stomach, bloating ext. Pt's apt was changed to tomorrow 04/14/20 @ 2:30 pm.

## 2020-04-13 NOTE — Telephone Encounter (Signed)
Pt came by to see if Dr Lodema Hong could call [facility] to get something done. Pt complains with burning in her stomach. I advised that I would send a message over to Dr Lodema Hong. But to also reach out to [employer] and advise

## 2020-04-14 ENCOUNTER — Encounter: Payer: Self-pay | Admitting: Gastroenterology

## 2020-04-14 ENCOUNTER — Other Ambulatory Visit: Payer: Self-pay

## 2020-04-14 ENCOUNTER — Ambulatory Visit (INDEPENDENT_AMBULATORY_CARE_PROVIDER_SITE_OTHER): Payer: Medicare Other | Admitting: Gastroenterology

## 2020-04-14 VITALS — BP 137/82 | HR 75 | Temp 97.3°F | Ht 64.0 in | Wt 173.0 lb

## 2020-04-14 DIAGNOSIS — K219 Gastro-esophageal reflux disease without esophagitis: Secondary | ICD-10-CM

## 2020-04-14 DIAGNOSIS — D649 Anemia, unspecified: Secondary | ICD-10-CM | POA: Diagnosis not present

## 2020-04-14 NOTE — Patient Instructions (Signed)
We are arranging an upper endoscopy in the future.  Let's stop Protonix for now. Start Dexilant once each morning. Let me know how this works for you!  Please have blood work done today.   Further recommendations to follow!  I enjoyed seeing you again today! As you know, I value our relationship and want to provide genuine, compassionate, and quality care. I welcome your feedback. If you receive a survey regarding your visit,  I greatly appreciate you taking time to fill this out. See you next time!  Gelene Mink, PhD, ANP-BC [facility]

## 2020-04-14 NOTE — Progress Notes (Signed)
Referring Provider: Kerri Perches, MD Primary Care Physician:  Kerri Perches, MD Primary GI: previously Dr. Darrick Penna   Chief Complaint  Patient presents with  . Abdominal Pain    Hot sensation in belly that radiates through her upper body    HPI:   BEAUTIFUL PENSYL is a 69 y.o. female presenting today with a history of GERD, constipation, anemia, nausea, abdominal pain, with prior evaluation including capsule study, normal GES, chronic abdominal pain with most recently worsening pain. CT completed 6/3 without acute findings. Gallbladder present. Last EGD Aug 2018 with multiple gastric polyps and gastritis.   Since March will have upper abdominal  "heat" and burning, and radiates up into chest, makes mouth dry, and the heat goes down left arm and down right arm this morning to elbow. No obvious occurrence after eating. Happens at night and in the mornings. No pain after eating but concerned about eating as she worries something will be triggered. Loss of appetite.  Sometimes feels nauseated. Will have a regular stool at times after getting hot. Probiotic doesn't help. Not constipated. Taking Protonix BID. Doesn't feel like it's helping. Belched well with ginger ale but didn't make her feel better. Will put ice on her belly and feel better for a bit but then goes away. "heat" happens every day. No dizziness, chest pain. States her heart will beat fast when the heat comes on her. States her body will feel hot but no fever. No wheezing, facial flushing, or diarrhea.   After eating popcorn shrimp and fish at [facility] in March, her symptoms started. Weight fluctuating. Linzess 72 mcg daily.   She was weighing in the low 180s March 2021, now 173. However, her weight has been fluctuating with documented weights in mid 170s last year.    Past Medical History:  Diagnosis Date  . Anxiety   . At low risk for fall 08/15/2018  . Cystitis   . Depression   . Gastritis   . GERD  (gastroesophageal reflux disease)   . Hashimoto's thyroiditis    Hx   . Hyperlipidemia   . Hypertension   . Hypothyroidism   . Normocytic anemia 2009 Hb 9.9-11.1  . Osteopenia   . Shoulder pain   . Urinary incontinence     Past Surgical History:  Procedure Laterality Date  . Bilateral tubal ligation    . BREAST EXCISIONAL BIOPSY  2010   Excisional biopsy of benign left breast mass -lopoma   . BREAST SURGERY     left nreast biopsy  . COLONOSCOPY  08/26/2011   SLF: 1. Internal hemorrhoids  . COLONOSCOPY N/A 10/06/2015   CNO:BSJG fissure or internal hemorrhoids/mild sigmoid colitis. benign colonic path   . colonscopy  2005   Dr. Katrinka Blazing  . ESOPHAGOGASTRODUODENOSCOPY  12/2009   chronic gastritis  . ESOPHAGOGASTRODUODENOSCOPY N/A 05/17/2017   Dr. Darrick Penna; gastritis, normal small bowel biopsies, multiple gastric polyps. fundic gland polyps  . GIVENS CAPSULE STUDY N/A 05/30/2017   gastritis, no source for anemia identified  . left knee surgery Left March 1,2017  . PARTIAL KNEE ARTHROPLASTY Left 08/09/2017   Procedure: LEFT UNICOMPARTMENTAL KNEE;  Surgeon: Ollen Gross, MD;  Location: [facility] ORS;  Service: Orthopedics;  Laterality: Left;  . Resection of left lobe of thyroid    . right carpal tunnel release    . rt. knee athroscopy  2004  . SAVORY DILATION N/A 05/17/2017   Procedure: SAVORY DILATION;  Surgeon: West Bali, MD;  Location: [facility] SUITE;  Service: Endoscopy;  Laterality: N/A;  . TOTAL ABDOMINAL HYSTERECTOMY  1994  . UMBILICAL HERNIA REPAIR    . Urethral dilation for stenosis  2009    Current Outpatient Medications  Medication Sig Dispense Refill  . acetaminophen (TYLENOL) 500 MG tablet Take 1 tablet (500 mg total) by mouth every 6 (six) hours as needed. (Patient taking differently: Take 500 mg by mouth as needed. ) 30 tablet 0  . amLODipine (NORVASC) 2.5 MG tablet TAKE 1 TABLET(2.5 MG) BY MOUTH DAILY 90 tablet 1  . atorvastatin (LIPITOR) 80 MG tablet Take 1 tablet (80  mg total) by mouth daily. 90 tablet 3  . citalopram (CELEXA) 20 MG tablet TAKE 1 TABLET BY MOUTH  DAILY 90 tablet 3  . gabapentin (NEURONTIN) 100 MG capsule Take 1 capsule (100 mg total) by mouth at bedtime. 90 capsule 3  . hydrochlorothiazide (HYDRODIURIL) 25 MG tablet TAKE 1 TABLET BY MOUTH  DAILY 90 tablet 3  . hydrOXYzine (ATARAX/VISTARIL) 25 MG tablet Take 1 tablet (25 mg total) by mouth 3 (three) times daily as needed. (Patient taking differently: Take 25 mg by mouth as needed. ) 30 tablet 0  . levothyroxine (SYNTHROID) 112 MCG tablet TAKE 1 TABLET BY MOUTH ONCE DAILY BEFORE BREAKFAST 90 tablet 0  . linaclotide (LINZESS) 72 MCG capsule Take 1 capsule (72 mcg total) by mouth daily before breakfast. 90 capsule 3  . loratadine (CLARITIN) 10 MG tablet Take 1 tablet (10 mg total) by mouth daily. (Patient taking differently: Take 10 mg by mouth as needed for allergies. ) 30 tablet 3  . pantoprazole (PROTONIX) 40 MG tablet TAKE 1 TABLET BY MOUTH  TWICE DAILY 180 tablet 3  . Polyethyl Glycol-Propyl Glycol (SYSTANE) 0.4-0.3 % SOLN Apply 1 drop to eye 3 (three) times daily as needed (for dry eyes.).    . Probiotic Product (PROBIOTIC ADVANCED PO) Take 1 capsule by mouth daily.    . triamcinolone cream (KENALOG) 0.1 % Apply twice daily to rash  On right leg for 5 days , then as needed 45 g 0   No current facility-administered medications for this visit.    Allergies as of 04/14/2020 - Review Complete 04/14/2020  Allergen Reaction Noted  . Cozaar [losartan potassium] Cough 07/04/2018  . Dilaudid [hydromorphone hcl] Other (See Comments) 03/01/2011  . Levaquin [levofloxacin] Other (See Comments) 07/28/2014  . Nsaids Other (See Comments) 01/13/2010  . Promethazine  05/17/2017    Family History  Problem Relation Age of Onset  . Pneumonia Mother   . Healthy Father   . Colon cancer Neg Hx   . Anesthesia problems Neg Hx   . Hypotension Neg Hx   . Malignant hyperthermia Neg Hx   . Pseudochol  deficiency Neg Hx     Social History   Socioeconomic History  . Marital status: Married    Spouse name: Not on file  . Number of children: 3  . Years of education: Not on file  . Highest education level: Not on file  Occupational History  . 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 Smoking status: Former Smoker    Packs/day: 0.25    Years: 1.00    Pack years: 0.25    Types: Cigarettes    Quit date: 08/25/1972    Years since quitting: 47.6  . Smokeless tobacco: Never Used  Vaping Use  . Vaping Use: Never used  Substance and Sexual Activity  . Alcohol use: No  .   Drug use: No  . Sexual activity: Yes    Birth control/protection: Surgical    Comment: married  Other Topics Concern  . Not on file  Social History Narrative  . Not on file   Social Determinants of Health   Financial Resource Strain:   . Difficulty of Paying Living Expenses:   Food Insecurity:   . Worried About Programme researcher, broadcasting/film/video in the Last Year:   . Barista in the Last Year:   Transportation Needs:   . Freight forwarder (Medical):   Marland Kitchen Lack of Transportation (Non-Medical):   Physical Activity:   . Days of Exercise per Week:   . Minutes of Exercise per Session:   Stress:   . Feeling of Stress :   Social Connections:   . Frequency of Communication with Friends and Family:   . Frequency of Social Gatherings with Friends and Family:   . Attends Religious Services:   . Active Member of Clubs or Organizations:   . Attends Banker Meetings:   Marland Kitchen Marital Status:     Review of Systems: Gen: see HPI CV: Denies chest pain, palpitations, syncope, peripheral edema, and claudication. Resp: Denies dyspnea at rest, cough, wheezing, coughing up blood, and pleurisy. GI: see HPI Derm: Denies rash, itching, dry skin Psych: Denies depression, anxiety, memory loss, confusion. No homicidal or suicidal ideation.  Heme: Denies bruising, bleeding, and enlarged lymph  nodes.  Physical Exam: BP 137/82   Pulse 75   Temp (!) 97.3 F (36.3 C) (Temporal)   Ht 5\' 4"  (1.626 m)   Wt 173 lb (78.5 kg)   BMI 29.70 kg/m  General:   Alert and oriented. Anxious, tearful.  Head:  Normocephalic and atraumatic. Eyes:  Conjuctiva clear without scleral icterus. Mouth:  Mask in place Lungs: clear bilaterally Cardiac: S1 S2 present without murmurs Abdomen:  +BS, soft, marked TTP epigastric and non-distended. No rebound or guarding. No HSM or masses noted. Msk:  Symmetrical without gross deformities. Normal posture. Extremities:  Without edema. Neurologic:  Alert and  oriented x4 Psych:  Alert and cooperative. Normal mood and affect.  ASSESSMENT: EVALENA FUJII is a 69 y.o. female presenting today with history of GERD, constipation, chronic anemia s/p thorough evaluation, now with worsening abdominal pain from baseline and interesting symptoms of "heat flashes".   Abdominal pain: CT completed 6/3 without acute findings. Gallbladder present but does not appear biliary related. Loss of appetite reported, with weight loss over past few months. However, weight has fluctuated over the years. She has marked anxiety related to pain, which I feel is definitely compounding symptoms. LFTs normal. Last EGD in 2018 with gastritis and multiple polyps. With loss of appetite, nausea, constellation of symptoms, will pursue diagnostic EGD. I feel this may be low yield but unable to exclude occult etiology. Interestingly, she had similar presentation in 2019 with marked anxiety, and CT was completed  with mesenteric vasculature widely patent. Symptoms do not seem consistent with chronic mesenteric ischemia, as she does not have a postprandial component. Would update CTA if EGD negative to be thorough. Last colonoscopy 2016; as no lower GI signs/symptoms, hold off on this.   Hot flashes: difficult historian. Doubt this is related to GI. Denies facial flushing, wheezing, diarrhea. Doubt  symptoms of carcinoid. Continue follow-up with PCP as this is being followed.   PLAN:   Proceed with upper endoscopy in the near future with Dr. 2017 using PROPOFOL. The risks, benefits, and alternatives  have been discussed in detail with patient. They have stated understanding and desire to proceed.   Stop Protonix, Trial of Dexilant  Updated CBC with iron studies: this was stable with prior baseline and ferritin 129.   Consider CTA if EGD negative in light of constellation of symptoms and weight loss  Continue close follow-up with PCP   Annitta Needs, PhD, ANP-BC [facility]

## 2020-04-14 NOTE — H&P (View-Only) (Signed)
Referring Provider: Kerri Perches, MD Primary Care Physician:  Kerri Perches, MD Primary GI: previously Dr. Darrick Penna   Chief Complaint  Patient presents with  . Abdominal Pain    Hot sensation in belly that radiates through her upper body    HPI:   Laura [name] is a 69 y.o. female presenting today with a history of GERD, constipation, anemia, nausea, abdominal pain, with prior evaluation including capsule study, normal GES, chronic abdominal pain with most recently worsening pain. CT completed 6/3 without acute findings. Gallbladder present. Last EGD Aug 2018 with multiple gastric polyps and gastritis.   Since March will have upper abdominal  "heat" and burning, and radiates up into chest, makes mouth dry, and the heat goes down left arm and down right arm this morning to elbow. No obvious occurrence after eating. Happens at night and in the mornings. No pain after eating but concerned about eating as she worries something will be triggered. Loss of appetite.  Sometimes feels nauseated. Will have a regular stool at times after getting hot. Probiotic doesn't help. Not constipated. Taking Protonix BID. Doesn't feel like it's helping. Belched well with ginger ale but didn't make her feel better. Will put ice on her belly and feel better for a bit but then goes away. "heat" happens every day. No dizziness, chest pain. States her heart will beat fast when the heat comes on her. States her body will feel hot but no fever. No wheezing, facial flushing, or diarrhea.   After eating popcorn shrimp and fish at [facility] in March, her symptoms started. Weight fluctuating. Linzess 72 mcg daily.   She was weighing in the low 180s March 2021, now 173. However, her weight has been fluctuating with documented weights in mid 170s last year.    Past Medical History:  Diagnosis Date  . Anxiety   . At low risk for fall 08/15/2018  . Cystitis   . Depression   . Gastritis   . GERD  (gastroesophageal reflux disease)   . Hashimoto's thyroiditis    Hx   . Hyperlipidemia   . Hypertension   . Hypothyroidism   . Normocytic anemia 2009 Hb 9.9-11.1  . Osteopenia   . Shoulder pain   . Urinary incontinence     Past Surgical History:  Procedure Laterality Date  . Bilateral tubal ligation    . BREAST EXCISIONAL BIOPSY  2010   Excisional biopsy of benign left breast mass -lopoma   . BREAST SURGERY     left nreast biopsy  . COLONOSCOPY  08/26/2011   SLF: 1. Internal hemorrhoids  . COLONOSCOPY N/A 10/06/2015   CNO:BSJG fissure or internal hemorrhoids/mild sigmoid colitis. benign colonic path   . colonscopy  2005   Dr. Katrinka Blazing  . ESOPHAGOGASTRODUODENOSCOPY  12/2009   chronic gastritis  . ESOPHAGOGASTRODUODENOSCOPY N/A 05/17/2017   Dr. Darrick Penna; gastritis, normal small bowel biopsies, multiple gastric polyps. fundic gland polyps  . GIVENS CAPSULE STUDY N/A 05/30/2017   gastritis, no source for anemia identified  . left knee surgery Left March 1,2017  . PARTIAL KNEE ARTHROPLASTY Left 08/09/2017   Procedure: LEFT UNICOMPARTMENTAL KNEE;  Surgeon: Ollen Gross, MD;  Location: [facility];  Service: Orthopedics;  Laterality: Left;  . Resection of left lobe of thyroid    . right carpal tunnel release    . rt. knee athroscopy  2004  . SAVORY DILATION N/A 05/17/2017   Procedure: SAVORY DILATION;  Surgeon: West Bali, MD;  Location: [facility];  Service: Endoscopy;  Laterality: N/A;  . TOTAL ABDOMINAL HYSTERECTOMY  1994  . UMBILICAL HERNIA REPAIR    . Urethral dilation for stenosis  2009    Current Outpatient Medications  Medication Sig Dispense Refill  . acetaminophen (TYLENOL) 500 MG tablet Take 1 tablet (500 mg total) by mouth every 6 (six) hours as needed. (Patient taking differently: Take 500 mg by mouth as needed. ) 30 tablet 0  . amLODipine (NORVASC) 2.5 MG tablet TAKE 1 TABLET(2.5 MG) BY MOUTH DAILY 90 tablet 1  . atorvastatin (LIPITOR) 80 MG tablet Take 1 tablet (80  mg total) by mouth daily. 90 tablet 3  . citalopram (CELEXA) 20 MG tablet TAKE 1 TABLET BY MOUTH  DAILY 90 tablet 3  . gabapentin (NEURONTIN) 100 MG capsule Take 1 capsule (100 mg total) by mouth at bedtime. 90 capsule 3  . hydrochlorothiazide (HYDRODIURIL) 25 MG tablet TAKE 1 TABLET BY MOUTH  DAILY 90 tablet 3  . hydrOXYzine (ATARAX/VISTARIL) 25 MG tablet Take 1 tablet (25 mg total) by mouth 3 (three) times daily as needed. (Patient taking differently: Take 25 mg by mouth as needed. ) 30 tablet 0  . levothyroxine (SYNTHROID) 112 MCG tablet TAKE 1 TABLET BY MOUTH ONCE DAILY BEFORE BREAKFAST 90 tablet 0  . linaclotide (LINZESS) 72 MCG capsule Take 1 capsule (72 mcg total) by mouth daily before breakfast. 90 capsule 3  . loratadine (CLARITIN) 10 MG tablet Take 1 tablet (10 mg total) by mouth daily. (Patient taking differently: Take 10 mg by mouth as needed for allergies. ) 30 tablet 3  . pantoprazole (PROTONIX) 40 MG tablet TAKE 1 TABLET BY MOUTH  TWICE DAILY 180 tablet 3  . Polyethyl Glycol-Propyl Glycol (SYSTANE) 0.4-0.3 % SOLN Apply 1 drop to eye 3 (three) times daily as needed (for dry eyes.).    Marland Kitchen Probiotic Product (PROBIOTIC ADVANCED PO) Take 1 capsule by mouth daily.    Marland Kitchen triamcinolone cream (KENALOG) 0.1 % Apply twice daily to rash  On right leg for 5 days , then as needed 45 g 0   No current facility-administered medications for this visit.    Allergies as of 04/14/2020 - Review Complete 04/14/2020  Allergen Reaction Noted  . Cozaar [losartan potassium] Cough 07/04/2018  . Dilaudid [hydromorphone hcl] Other (See Comments) 03/01/2011  . Levaquin [levofloxacin] Other (See Comments) 07/28/2014  . Nsaids Other (See Comments) 01/13/2010  . Promethazine  05/17/2017    Family History  Problem Relation Age of Onset  . Pneumonia Mother   . Healthy Father   . Colon cancer Neg Hx   . Anesthesia problems Neg Hx   . Hypotension Neg Hx   . Malignant hyperthermia Neg Hx   . Pseudochol  deficiency Neg Hx     Social History   Socioeconomic History  . Marital status: Married    Spouse name: Not on file  . Number of children: 3  . Years of education: Not on file  . Highest education level: Not on file  Occupational History  . 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 Smoking status: Former Smoker    Packs/day: 0.25    Years: 1.00    Pack years: 0.25    Types: Cigarettes    Quit date: 08/25/1972    Years since quitting: 47.6  . Smokeless tobacco: Never Used  Vaping Use  . Vaping Use: Never used  Substance and Sexual Activity  . Alcohol use: No  .  Drug use: No  . Sexual activity: Yes    Birth control/protection: Surgical    Comment: married  Other Topics Concern  . Not on file  Social History Narrative  . Not on file   Social Determinants of Health   Financial Resource Strain:   . Difficulty of Paying Living Expenses:   Food Insecurity:   . Worried About Programme researcher, broadcasting/film/video in the Last Year:   . Barista in the Last Year:   Transportation Needs:   . Freight forwarder (Medical):   Marland Kitchen Lack of Transportation (Non-Medical):   Physical Activity:   . Days of Exercise per Week:   . Minutes of Exercise per Session:   Stress:   . Feeling of Stress :   Social Connections:   . Frequency of Communication with Friends and Family:   . Frequency of Social Gatherings with Friends and Family:   . Attends Religious Services:   . Active Member of Clubs or Organizations:   . Attends Banker Meetings:   Marland Kitchen Marital Status:     Review of Systems: Gen: see HPI CV: Denies chest pain, palpitations, syncope, peripheral edema, and claudication. Resp: Denies dyspnea at rest, cough, wheezing, coughing up blood, and pleurisy. GI: see HPI Derm: Denies rash, itching, dry skin Psych: Denies depression, anxiety, memory loss, confusion. No homicidal or suicidal ideation.  Heme: Denies bruising, bleeding, and enlarged lymph  nodes.  Physical Exam: BP 137/82   Pulse 75   Temp (!) 97.3 F (36.3 C) (Temporal)   Ht 5\' 4"  (1.626 m)   Wt 173 lb (78.5 kg)   BMI 29.70 kg/m  General:   Alert and oriented. Anxious, tearful.  Head:  Normocephalic and atraumatic. Eyes:  Conjuctiva clear without scleral icterus. Mouth:  Mask in place Lungs: clear bilaterally Cardiac: S1 S2 present without murmurs Abdomen:  +BS, soft, marked TTP epigastric and non-distended. No rebound or guarding. No HSM or masses noted. Msk:  Symmetrical without gross deformities. Normal posture. Extremities:  Without edema. Neurologic:  Alert and  oriented x4 Psych:  Alert and cooperative. Normal mood and affect.  ASSESSMENT: Laura [name] is a 69 y.o. female presenting today with history of GERD, constipation, chronic anemia s/p thorough evaluation, now with worsening abdominal pain from baseline and interesting symptoms of "heat flashes".   Abdominal pain: CT completed 6/3 without acute findings. Gallbladder present but does not appear biliary related. Loss of appetite reported, with weight loss over past few months. However, weight has fluctuated over the years. She has marked anxiety related to pain, which I feel is definitely compounding symptoms. LFTs normal. Last EGD in 2018 with gastritis and multiple polyps. With loss of appetite, nausea, constellation of symptoms, will pursue diagnostic EGD. I feel this may be low yield but unable to exclude occult etiology. Interestingly, she had similar presentation in 2019 with marked anxiety, and CT was completed  with mesenteric vasculature widely patent. Symptoms do not seem consistent with chronic mesenteric ischemia, as she does not have a postprandial component. Would update CTA if EGD negative to be thorough. Last colonoscopy 2016; as no lower GI signs/symptoms, hold off on this.   Hot flashes: difficult historian. Doubt this is related to GI. Denies facial flushing, wheezing, diarrhea. Doubt  symptoms of carcinoid. Continue follow-up with PCP as this is being followed.   PLAN:   Proceed with upper endoscopy in the near future with Dr. 2017 using PROPOFOL. The risks, benefits, and alternatives  have been discussed in detail with patient. They have stated understanding and desire to proceed.   Stop Protonix, Trial of Dexilant  Updated CBC with iron studies: this was stable with prior baseline and ferritin 129.   Consider CTA if EGD negative in light of constellation of symptoms and weight loss  Continue close follow-up with PCP   Annitta Needs, PhD, ANP-BC [facility]

## 2020-04-15 ENCOUNTER — Encounter: Payer: Self-pay | Admitting: Family Medicine

## 2020-04-15 LAB — CBC WITH DIFFERENTIAL/PLATELET
Absolute Monocytes: 428 cells/uL (ref 200–950)
Basophils Absolute: 38 cells/uL (ref 0–200)
Basophils Relative: 0.8 %
Eosinophils Absolute: 38 cells/uL (ref 15–500)
Eosinophils Relative: 0.8 %
HCT: 35.6 % (ref 35.0–45.0)
Hemoglobin: 11.5 g/dL — ABNORMAL LOW (ref 11.7–15.5)
Lymphs Abs: 1499 cells/uL (ref 850–3900)
MCH: 27.8 pg (ref 27.0–33.0)
MCHC: 32.3 g/dL (ref 32.0–36.0)
MCV: 86.2 fL (ref 80.0–100.0)
MPV: 10.3 fL (ref 7.5–12.5)
Monocytes Relative: 9.1 %
Neutro Abs: 2698 cells/uL (ref 1500–7800)
Neutrophils Relative %: 57.4 %
Platelets: 293 10*3/uL (ref 140–400)
RBC: 4.13 10*6/uL (ref 3.80–5.10)
RDW: 11.4 % (ref 11.0–15.0)
Total Lymphocyte: 31.9 %
WBC: 4.7 10*3/uL (ref 3.8–10.8)

## 2020-04-15 LAB — IRON,TIBC AND FERRITIN PANEL
%SAT: 20 % (calc) (ref 16–45)
Ferritin: 129 ng/mL (ref 16–288)
Iron: 51 ug/dL (ref 45–160)
TIBC: 258 mcg/dL (calc) (ref 250–450)

## 2020-04-16 ENCOUNTER — Telehealth: Payer: Self-pay | Admitting: Family Medicine

## 2020-04-16 NOTE — Telephone Encounter (Signed)
Good afternoon Laura [name],  Our mutual patient Laura [name] has sent me a message yesterday requesting a referral to another gastroenterologist because she is concerned that her upper endoscopy is several weeks away and that she continues to have pain and weight loss.  This is documented in her record as a patient message so you may review this. I   Advised her today that I would reach out first to your office to see if the endoscopy can be expedited before trying to look elsewhere .   If you can  look into this and let us know what the next best option is from your office please do send me back a phone message thank you

## 2020-04-17 ENCOUNTER — Other Ambulatory Visit: Payer: Self-pay

## 2020-04-17 NOTE — Telephone Encounter (Signed)
Called and informed pt of pre-op/covid test 04/20/20 at 8:00am. Message also sent to her via MyChart.

## 2020-04-17 NOTE — Patient Instructions (Signed)
20    Your procedure is scheduled on: 04/21/2020  Report to [facility] at   8:30  AM.  Call this number if you have problems the morning of surgery: [phone]   Remember:   Do not drink or eat food:After Midnight.        No Smoking the day of procedure      Take these medicines the morning of surgery with A SIP OF WATER: Amlodipine, Celexa, Dexilant and levothyroxine   Do not wear jewelry, make-up or nail polish.  Do not wear lotions, powders, or perfumes. You may wear deodorant.                Do not bring valuables to the hospital.  Contacts, dentures or bridgework may not be worn into surgery.  Leave suitcase in the car. After surgery it may be brought to your room.  For patients admitted to the hospital, checkout time is 11:00 AM the day of discharge.   Patients discharged the day of surgery will not be allowed to drive home. Upper Endoscopy, Adult Upper endoscopy is a procedure to look inside the upper GI (gastrointestinal) tract. The upper GI tract is made up of:  The part of the body that moves food from your mouth to your stomach (esophagus).  The stomach.  The first part of your small intestine (duodenum). This procedure is also called esophagogastroduodenoscopy (EGD) or gastroscopy. In this procedure, your health care provider passes a thin, flexible tube (endoscope) through your mouth and down your esophagus into your stomach. A small camera is attached to the end of the tube. Images from the camera appear on a monitor in the exam room. During this procedure, your health care provider may also remove a small piece of tissue to be sent to a lab and examined under a microscope (biopsy). Your health care provider may do an upper endoscopy to diagnose cancers of the upper GI tract. You may also have this procedure to find the cause of other conditions, such as:  Stomach pain.  Heartburn.  Pain or problems when swallowing.  Nausea and vomiting.  Stomach  bleeding.  Stomach ulcers. Tell a health care provider about:  Any allergies you have.  All medicines you are taking, including vitamins, herbs, eye drops, creams, and over-the-counter medicines.  Any problems you or family members have had with anesthetic medicines.  Any blood disorders you have.  Any surgeries you have had.  Any medical conditions you have.  Whether you are pregnant or may be pregnant. What are the risks? Generally, this is a safe procedure. However, problems may occur, including:  Infection.  Bleeding.  Allergic reactions to medicines.  A tear or hole (perforation) in the esophagus, stomach, or duodenum. What happens before the procedure? Staying hydrated Follow instructions from your health care provider about hydration, which may include:  Up to 2 hours before the procedure - you may continue to drink clear liquids, such as water, clear fruit juice, black coffee, and plain tea.  Eating and drinking restrictions Follow instructions from your health care provider about eating and drinking, which may include:  8 hours before the procedure - stop eating heavy meals or foods, such as meat, fried foods, or fatty foods.  6 hours before the procedure - stop eating light meals or foods, such as toast or cereal.  6 hours before the procedure - stop drinking milk or drinks that contain milk.  2 hours before the procedure - stop drinking clear  liquids. Medicines Ask your health care provider about:  Changing or stopping your regular medicines. This is especially important if you are taking diabetes medicines or blood thinners.  Taking medicines such as aspirin and ibuprofen. These medicines can thin your blood. Do not take these medicines unless your health care provider tells you to take them.  Taking over-the-counter medicines, vitamins, herbs, and supplements. General instructions  Plan to have someone take you home from the hospital or clinic.  If  you will be going home right after the procedure, plan to have someone with you for 24 hours.  Ask your health care provider what steps will be taken to help prevent infection. What happens during the procedure?  1. An IV will be inserted into one of your veins. 2. You may be given one or more of the following: ? A medicine to help you relax (sedative). ? A medicine to numb the throat (local anesthetic). 3. You will lie on your left side on an exam table. 4. Your health care provider will pass the endoscope through your mouth and down your esophagus. 5. Your health care provider will use the scope to check the inside of your esophagus, stomach, and duodenum. Biopsies may be taken. 6. The endoscope will be removed. The procedure may vary among health care providers and hospitals. What happens after the procedure?  Your blood pressure, heart rate, breathing rate, and blood oxygen level will be monitored until you leave the hospital or clinic.  Do not drive for 24 hours if you were given a sedative during your procedure.  When your throat is no longer numb, you may be given some fluids to drink.  It is up to you to get the results of your procedure. Ask your health care provider, or the department that is doing the procedure, when your results will be ready. Summary  Upper endoscopy is a procedure to look inside the upper GI tract.  During the procedure, an IV will be inserted into one of your veins. You may be given a medicine to help you relax.  A medicine will be used to numb your throat.  The endoscope will be passed through your mouth and down your esophagus. This information is not intended to replace advice given to you by your health care provider. Make sure you discuss any questions you have with your health care provider. Document Revised: 04/11/2018 Document Reviewed: 03/19/2018 Elsevier Patient Education  [place] After  Please read the instructions outlined below and refer to this sheet in the next few weeks. These discharge instructions provide you with general information on caring for yourself after you leave the hospital. Your doctor may also give you specific instructions.  While your treatment has been planned according to the most current medical practices available, unavoidable complications occasionally occur. If you have any problems or questions after discharge, please call your doctor. HOME CARE INSTRUCTIONS Activity  You may resume your regular activity but move at a slower pace for the next 24 hours.   Take frequent rest periods for the next 24 hours.   Walking will help expel (get rid of) the air and reduce the bloated feeling in your abdomen.   No driving for 24 hours (because of the anesthesia (medicine) used during the test).   You may shower.   Do not sign any important legal documents or operate any machinery for 24 hours (because of the anesthesia used during the test).  Nutrition  Drink plenty of fluids.   You may resume your normal diet.   Begin with a light meal and progress to your normal diet.   Avoid alcoholic beverages for 24 hours or as instructed by your caregiver.  Medications You may resume your normal medications unless your caregiver tells you otherwise. What you can expect today  You may experience abdominal discomfort such as a feeling of fullness or "gas" pains.   You may experience a sore throat for 2 to 3 days. This is normal. Gargling with salt water may help this.  Follow-up Your doctor will discuss the results of your test with you. SEEK IMMEDIATE MEDICAL CARE IF:  You have excessive nausea (feeling sick to your stomach) and/or vomiting.   You have severe abdominal pain and distention (swelling).   You have trouble swallowing.   You have a  temperature over 100 F (37.8 C).   You have rectal bleeding or vomiting of blood.  Document Released: 05/31/2004 Document Revised: 10/06/2011 Document Reviewed: 12/12/2007

## 2020-04-17 NOTE — Telephone Encounter (Signed)
Procedure instructions sent to pt via MyChart.  FYI to AB procedure has been scheduled.

## 2020-04-17 NOTE — Telephone Encounter (Signed)
Dr. Lodema Hong: patient is on for 6/22. Thanks so much!

## 2020-04-17 NOTE — Telephone Encounter (Signed)
Reviewed

## 2020-04-17 NOTE — Telephone Encounter (Signed)
Dr. Lodema Hong,  Thanks for the info! I am reaching out to scheduling to see what we can do. I've copied them on this.

## 2020-04-17 NOTE — Telephone Encounter (Signed)
PA for EGD submitted via [facility] website. Case approved. PA# [id number], valid 04/21/20-07/20/20.

## 2020-04-17 NOTE — Telephone Encounter (Signed)
Had cancellation for next week. Called pt, EGD w/Prop w/RMR scheduled for 04/21/20 at 10:00am. Orders entered.

## 2020-04-20 ENCOUNTER — Encounter (HOSPITAL_COMMUNITY)
Admission: RE | Admit: 2020-04-20 | Discharge: 2020-04-20 | Disposition: A | Discharge status: Home / Self Care | Payer: Medicare Other | Source: Ambulatory Visit | Attending: Internal Medicine | Admitting: Internal Medicine

## 2020-04-20 ENCOUNTER — Other Ambulatory Visit: Payer: Self-pay

## 2020-04-20 ENCOUNTER — Telehealth: Payer: Self-pay | Admitting: *Deleted

## 2020-04-20 ENCOUNTER — Other Ambulatory Visit (HOSPITAL_COMMUNITY)
Admission: RE | Admit: 2020-04-20 | Discharge: 2020-04-20 | Disposition: A | Discharge status: Home / Self Care | Payer: Medicare Other | Source: Ambulatory Visit | Attending: Internal Medicine | Admitting: Internal Medicine

## 2020-04-20 DIAGNOSIS — Z20822 Contact with and (suspected) exposure to covid-19: Secondary | ICD-10-CM | POA: Insufficient documentation

## 2020-04-20 DIAGNOSIS — Z01812 Encounter for preprocedural laboratory examination: Secondary | ICD-10-CM | POA: Diagnosis not present

## 2020-04-20 LAB — SARS CORONAVIRUS 2 (TAT 6-24 HRS): SARS Coronavirus 2: NEGATIVE

## 2020-04-20 NOTE — Progress Notes (Signed)
Cc'ed to pcp °

## 2020-04-20 NOTE — Telephone Encounter (Signed)
Laura [name] called from [employer] and said that pt is having EGD tomorrow and no Berkley Harvey is on file per [facility] Medicare.  Called to f/u and spoke Melvyn Novas.  She informed me that an Berkley Harvey was on file from 04/17/20 and had been approved from 04/21/20-07/20/20.  Auth approval #: [id number].  The reference number for my call was [id number].  Called and LVMOM for [employer] at [employer].

## 2020-04-21 ENCOUNTER — Ambulatory Visit (HOSPITAL_COMMUNITY): Payer: Medicare Other | Admitting: Anesthesiology

## 2020-04-21 ENCOUNTER — Ambulatory Visit (HOSPITAL_COMMUNITY)
Admission: RE | Admit: 2020-04-21 | Discharge: 2020-04-21 | Disposition: A | Discharge status: Home / Self Care | Payer: Medicare Other | Attending: Internal Medicine | Admitting: Internal Medicine

## 2020-04-21 ENCOUNTER — Other Ambulatory Visit: Payer: Self-pay

## 2020-04-21 ENCOUNTER — Encounter (HOSPITAL_COMMUNITY): Payer: Self-pay | Admitting: Internal Medicine

## 2020-04-21 ENCOUNTER — Encounter (HOSPITAL_COMMUNITY)
Admission: RE | Disposition: A | Discharge status: Home / Self Care | Payer: Self-pay | Source: Home / Self Care | Attending: Internal Medicine

## 2020-04-21 DIAGNOSIS — K2950 Unspecified chronic gastritis without bleeding: Secondary | ICD-10-CM | POA: Diagnosis not present

## 2020-04-21 DIAGNOSIS — E785 Hyperlipidemia, unspecified: Secondary | ICD-10-CM | POA: Diagnosis not present

## 2020-04-21 DIAGNOSIS — E063 Autoimmune thyroiditis: Secondary | ICD-10-CM | POA: Insufficient documentation

## 2020-04-21 DIAGNOSIS — F419 Anxiety disorder, unspecified: Secondary | ICD-10-CM | POA: Diagnosis not present

## 2020-04-21 DIAGNOSIS — I10 Essential (primary) hypertension: Secondary | ICD-10-CM | POA: Diagnosis not present

## 2020-04-21 DIAGNOSIS — R1013 Epigastric pain: Secondary | ICD-10-CM | POA: Diagnosis not present

## 2020-04-21 DIAGNOSIS — Z881 Allergy status to other antibiotic agents status: Secondary | ICD-10-CM | POA: Diagnosis not present

## 2020-04-21 DIAGNOSIS — Z79890 Hormone replacement therapy (postmenopausal): Secondary | ICD-10-CM | POA: Diagnosis not present

## 2020-04-21 DIAGNOSIS — K219 Gastro-esophageal reflux disease without esophagitis: Secondary | ICD-10-CM | POA: Diagnosis not present

## 2020-04-21 DIAGNOSIS — Z79899 Other long term (current) drug therapy: Secondary | ICD-10-CM | POA: Insufficient documentation

## 2020-04-21 DIAGNOSIS — Z96652 Presence of left artificial knee joint: Secondary | ICD-10-CM | POA: Insufficient documentation

## 2020-04-21 DIAGNOSIS — Z888 Allergy status to other drugs, medicaments and biological substances status: Secondary | ICD-10-CM | POA: Diagnosis not present

## 2020-04-21 DIAGNOSIS — F329 Major depressive disorder, single episode, unspecified: Secondary | ICD-10-CM | POA: Diagnosis not present

## 2020-04-21 DIAGNOSIS — K317 Polyp of stomach and duodenum: Secondary | ICD-10-CM | POA: Diagnosis not present

## 2020-04-21 DIAGNOSIS — Z87891 Personal history of nicotine dependence: Secondary | ICD-10-CM | POA: Diagnosis not present

## 2020-04-21 DIAGNOSIS — M8580 Other specified disorders of bone density and structure, unspecified site: Secondary | ICD-10-CM | POA: Insufficient documentation

## 2020-04-21 HISTORY — PX: BIOPSY: SHX5522

## 2020-04-21 HISTORY — PX: ESOPHAGOGASTRODUODENOSCOPY (EGD) WITH PROPOFOL: SHX5813

## 2020-04-21 HISTORY — PX: POLYPECTOMY: SHX5525

## 2020-04-21 SURGERY — ESOPHAGOGASTRODUODENOSCOPY (EGD) WITH PROPOFOL
Anesthesia: General

## 2020-04-21 MED ORDER — PROPOFOL 500 MG/50ML IV EMUL
INTRAVENOUS | Status: DC | PRN
  Administered 2020-04-21: 150 ug/kg/min via INTRAVENOUS
  Administered 2020-04-21: 100 mg via INTRAVENOUS

## 2020-04-21 MED ORDER — GLYCOPYRROLATE 0.2 MG/ML IJ SOLN
0.2000 mg | Freq: Once | INTRAMUSCULAR | Status: AC
  Administered 2020-04-21: 0.2 mg via INTRAVENOUS
  Filled 2020-04-21: qty 1

## 2020-04-21 MED ORDER — PROPOFOL 10 MG/ML IV BOLUS
INTRAVENOUS | Status: AC
  Filled 2020-04-21: qty 40

## 2020-04-21 MED ORDER — LIDOCAINE VISCOUS HCL 2 % MT SOLN
OROMUCOSAL | Status: AC
  Filled 2020-04-21: qty 15

## 2020-04-21 MED ORDER — LACTATED RINGERS IV SOLN: Freq: Once | INTRAVENOUS | Status: AC

## 2020-04-21 MED ORDER — CHLORHEXIDINE GLUCONATE CLOTH 2 % EX PADS: 6.0000 | MEDICATED_PAD | Freq: Once | CUTANEOUS | Status: DC

## 2020-04-21 MED ORDER — LIDOCAINE HCL (CARDIAC) PF 50 MG/5ML IV SOSY
PREFILLED_SYRINGE | INTRAVENOUS | Status: DC | PRN
  Administered 2020-04-21: 60 mg via INTRAVENOUS

## 2020-04-21 MED ORDER — LACTATED RINGERS IV SOLN
INTRAVENOUS | Status: DC | PRN
Start: 2020-04-21 — End: 2020-04-21

## 2020-04-21 MED ORDER — LIDOCAINE 2% (20 MG/ML) 5 ML SYRINGE
INTRAMUSCULAR | Status: AC
  Filled 2020-04-21: qty 5

## 2020-04-21 MED ORDER — LIDOCAINE VISCOUS HCL 2 % MT SOLN
15.0000 mL | Freq: Once | OROMUCOSAL | Status: AC
  Administered 2020-04-21: 15 mL via OROMUCOSAL

## 2020-04-21 NOTE — Interval H&P Note (Signed)
History and Physical Interval Note:  04/21/2020 10:51 AM  Laura [name]  has presented today for surgery, with the diagnosis of dyspepsia.  The various methods of treatment have been discussed with the patient and family. After consideration of risks, benefits and other options for treatment, the patient has consented to  Procedure(s) with comments: ESOPHAGOGASTRODUODENOSCOPY (EGD) WITH PROPOFOL (N/A) - 10:00am as a surgical intervention.  The patient's history has been reviewed, patient examined, no change in status, stable for surgery.  I have reviewed the patient's chart and labs.  Questions were answered to the patient's satisfaction.     Laura [name]  Patient tells me Dexilant has helped more than Protonix but she still has symptoms.  No dysphagia.  Diagnostic EGD today per plan.  The risks, benefits, limitations, alternatives and imponderables have been reviewed with the patient. Potential for esophageal dilation, biopsy, etc. have also been reviewed.  Questions have been answered. All parties agreeable.

## 2020-04-21 NOTE — Anesthesia Preprocedure Evaluation (Signed)
Anesthesia Evaluation  Patient identified by MRN, date of birth, ID band Patient awake    Reviewed: Allergy & Precautions, NPO status , Patient's Chart, lab work & pertinent test results  History of Anesthesia Complications Negative for: history of anesthetic complications  Airway Mallampati: III  TM Distance: >3 FB Neck ROM: Full    Dental  (+) Caps, Dental Advisory Given   Pulmonary former smoker,    Pulmonary exam normal breath sounds clear to auscultation       Cardiovascular Exercise Tolerance: Good hypertension, Pt. on medications Normal cardiovascular exam Rhythm:Regular Rate:Normal - Systolic murmurs, - Diastolic murmurs, - Friction Rub, - Carotid Bruit, - Peripheral Edema and - Systolic Click 03-Mar-2020 10:33:50 [facility]-ER ROUTINE RECORD Sinus rhythm Baseline wander Confirmed by Cathren Laine ([id number]) on 03/03/2020 10:39:50 AM   Neuro/Psych PSYCHIATRIC DISORDERS Anxiety Depression  Neuromuscular disease    GI/Hepatic Neg liver ROS, GERD  Medicated and Poorly Controlled,  Endo/Other  Hypothyroidism   Renal/GU negative Renal ROS  negative genitourinary   Musculoskeletal  (+) Arthritis  (back pain),   Abdominal   Peds  Hematology  (+) anemia ,   Anesthesia Other Findings As per patient, Chest pain radiating to the left/right arms, went to urgent care, r/o any cardiac issues.  Reproductive/Obstetrics negative OB ROS                            Anesthesia Physical Anesthesia Plan  ASA: II  Anesthesia Plan: General   Post-op Pain Management:    Induction: Intravenous  PONV Risk Score and Plan: 2 and TIVA  Airway Management Planned: Nasal Cannula, Natural Airway and Simple Face Mask  Additional Equipment:   Intra-op Plan:   Post-operative Plan:   Informed Consent: I have reviewed the patients History and Physical, chart, labs and discussed the procedure  including the risks, benefits and alternatives for the proposed anesthesia with the patient or authorized representative who has indicated his/her understanding and acceptance.     Dental advisory given  Plan Discussed with: CRNA and Surgeon  Anesthesia Plan Comments:         Anesthesia Quick Evaluation

## 2020-04-21 NOTE — Transfer of Care (Signed)
Immediate Anesthesia Transfer of Care Note  Patient: Laura [name]  Procedure(s) Performed: ESOPHAGOGASTRODUODENOSCOPY (EGD) WITH PROPOFOL (N/A ) BIOPSY POLYPECTOMY  Patient Location: PACU  Anesthesia Type:MAC  Level of Consciousness: awake, alert , oriented and patient cooperative  Airway & Oxygen Therapy: Patient Spontanous Breathing and Patient connected to nasal cannula oxygen  Post-op Assessment: Report given to RN, Post -op Vital signs reviewed and stable and Patient moving all extremities  Post vital signs: Reviewed and stable  Last Vitals:  Vitals Value Taken Time  BP 101/65 04/21/20 1110  Temp    Pulse 65 04/21/20 1111  Resp 15 04/21/20 1111  SpO2 96 % 04/21/20 1111  Vitals shown include unvalidated device data.  Last Pain:  Vitals:   04/21/20 1059  TempSrc:   PainSc: 0-No pain      Patients Stated Pain Goal: 7 (10/23/48 7530)  Complications: No complications documented.

## 2020-04-21 NOTE — Anesthesia Postprocedure Evaluation (Signed)
Anesthesia Post Note  Patient: Laura [name]  Procedure(s) Performed: ESOPHAGOGASTRODUODENOSCOPY (EGD) WITH PROPOFOL (N/A ) BIOPSY POLYPECTOMY  Patient location during evaluation: PACU Anesthesia Type: General Level of consciousness: awake, oriented, awake and alert and patient cooperative Pain management: pain level controlled Vital Signs Assessment: post-procedure vital signs reviewed and stable Respiratory status: spontaneous breathing, respiratory function stable and patient connected to nasal cannula oxygen Cardiovascular status: blood pressure returned to baseline and stable Postop Assessment: no headache and no backache Anesthetic complications: no   No complications documented.   Last Vitals:  Vitals:   04/21/20 0817  BP: 136/75  Pulse: 76  Resp: 14  Temp: 37.1 C  SpO2: 98%    Last Pain:  Vitals:   04/21/20 1059  TempSrc:   PainSc: 0-No pain                 Brynda Peon

## 2020-04-21 NOTE — Discharge Instructions (Signed)
EGD Discharge instructions Please read the instructions outlined below and refer to this sheet in the next few weeks. These discharge instructions provide you with general information on caring for yourself after you leave the hospital. Your doctor may also give you specific instructions. While your treatment has been planned according to the most current medical practices available, unavoidable complications occasionally occur. If you have any problems or questions after discharge, please call your doctor. ACTIVITY  You may resume your regular activity but move at a slower pace for the next 24 hours.   Take frequent rest periods for the next 24 hours.   Walking will help expel (get rid of) the air and reduce the bloated feeling in your abdomen.   No driving for 24 hours (because of the anesthesia (medicine) used during the test).   You may shower.   Do not sign any important legal documents or operate any machinery for 24 hours (because of the anesthesia used during the test).  NUTRITION  Drink plenty of fluids.   You may resume your normal diet.   Begin with a light meal and progress to your normal diet.   Avoid alcoholic beverages for 24 hours or as instructed by your caregiver.  MEDICATIONS  You may resume your normal medications unless your caregiver tells you otherwise.  WHAT YOU CAN EXPECT TODAY  You may experience abdominal discomfort such as a feeling of fullness or "gas" pains.  FOLLOW-UP  Your doctor will discuss the results of your test with you.  SEEK IMMEDIATE MEDICAL ATTENTION IF ANY OF THE FOLLOWING OCCUR:  Excessive nausea (feeling sick to your stomach) and/or vomiting.   Severe abdominal pain and distention (swelling).   Trouble swallowing.   Temperature over 101 F (37.8 C).   Rectal bleeding or vomiting of blood.    Your stomach look good.  1 small polyp removed  You may need further testing as discussed  Office visit with [name] in 1  month  Further recommendations to follow pending review of pathology report  Continue Dexilant 60 mg daily  At patient request, I called Laia Wiley [phone] - " voicemail box has not been set up"  PATIENT INSTRUCTIONS POST-ANESTHESIA  IMMEDIATELY FOLLOWING SURGERY:  Do not drive or operate machinery for the first twenty four hours after surgery.  Do not make any important decisions for twenty four hours after surgery or while taking narcotic pain medications or sedatives.  If you develop intractable nausea and vomiting or a severe headache please notify your doctor immediately.  FOLLOW-UP:  Please make an appointment with your surgeon as instructed. You do not need to follow up with anesthesia unless specifically instructed to do so.  WOUND CARE INSTRUCTIONS (if applicable):  Keep a dry clean dressing on the anesthesia/puncture wound site if there is drainage.  Once the wound has quit draining you may leave it open to air.  Generally you should leave the bandage intact for twenty four hours unless there is drainage.  If the epidural site drains for more than 36-48 hours please call the anesthesia department.  QUESTIONS?:  Please feel free to call your physician or the hospital operator if you have any questions, and they will be happy to assist you.       Gastric Polyps A gastric polyp, also called a stomach polyp, is a growth on the lining of the stomach. Most polyps are not dangerous, but some can be harmful because of their size, location, or type. Polyps that can  become harmful include:  Large polyps. These can turn into sores (ulcers). Ulcers can lead to stomach bleeding.  Polyps that block food from moving from the stomach to the small intestine (gastric outlet obstruction).  A type of polyp called an adenoma. This type of polyp can become cancerous. What are the causes? Gastric polyps form when the lining of the stomach gets inflamed or damaged. Stomach inflammation and  damage may be caused by:  A long-lasting stomach condition, such as gastritis.  Certain medicines used to reduce stomach acid.  An inherited condition called familial adenomatous polyposis. What are the signs or symptoms? Usually, this condition does not cause any symptoms. If you do have symptoms, they may include:  Pain or tenderness in the abdomen.  Nausea.  Trouble eating or swallowing.  Blood in the stool.  Anemia. How is this diagnosed? Gastric polyps are diagnosed with:  A medical procedure called endoscopy.  A lab test in which a part of the polyp is examined. This test is done with a sample of polyp tissue (biopsy) taken during an endoscopy. How is this treated? Treatment depends on the type, location, and size of the polyps. Treatment may involve:  Having the polyps checked regularly with an endoscopy.  Having the polyps removed with an endoscopy. This may be done if the polyps are harmful or can become harmful. Removing a polyp often prevents problems from developing.  Having the polyps removed with a surgery called a partial gastrectomy. This may be done in rare cases to remove very large polyps.  Treating the underlying condition that caused the polyps. Follow these instructions at home:  Take over-the-counter and prescription medicines only as told by your health care provider.  Keep all follow-up visits as told by your health care provider. This is important. Contact a health care provider if:  You develop new symptoms.  Your symptoms get worse. Get help right away if:  You vomit blood.  You have severe abdominal pain.  You cannot eat or drink.  You have blood in your stool. This information is not intended to replace advice given to you by your health care provider. Make sure you discuss any questions you have with your health care provider. Document Revised: 09/29/2017 Document Reviewed: 11/01/2015 Elsevier Patient Education  2020 [employer].

## 2020-04-21 NOTE — Op Note (Signed)
[facility] Patient Name: Laura [name] Procedure Date: 04/21/2020 10:43 AM MRN: [id number] Date of Birth: April 13, 1951 Attending MD: Gennette Pac , MD CSN: [id number] Age: 69 Admit Type: Outpatient Procedure:                Upper GI endoscopy Indications:              Dyspepsia Providers:                Gennette Pac, MD, Criselda Peaches. Patsy Lager, RN Referring MD:              Medicines:                Propofol per Anesthesia Complications:            No immediate complications. Estimated Blood Loss:     Estimated blood loss was minimal. Procedure:                Pre-Anesthesia Assessment:                           - Prior to the procedure, a History and Physical                            was performed, and patient medications and                            allergies were reviewed. The patient's tolerance of                            previous anesthesia was also reviewed. The risks                            and benefits of the procedure and the sedation                            options and risks were discussed with the patient.                            All questions were answered, and informed consent                            was obtained. Prior Anticoagulants: The patient has                            taken no previous anticoagulant or antiplatelet                            agents. ASA Grade Assessment: II - A patient with                            mild systemic disease. After reviewing the risks                            and benefits, the patient was deemed in  satisfactory condition to undergo the procedure.                           After obtaining informed consent, the endoscope was                            passed under direct vision. Throughout the                            procedure, the patient's blood pressure, pulse, and                            oxygen saturations were monitored continuously. The                             [id number]-[id number] ([id number]) scope was introduced through the                            mouth, and advanced to the second part of duodenum.                            The upper GI endoscopy was accomplished without                            difficulty. The patient tolerated the procedure                            well. Scope In: 11:00:57 AM Scope Out: 11:05:46 AM Total Procedure Duration: 0 hours 4 minutes 49 seconds  Findings:      The examined esophagus was normal.      Multiple 2 to 3 mm fundal gland appearing polyps or found in the gastric       mucosa. No ulcer or infiltrating process. Patent pylorus.      The duodenal bulb and second portion of the duodenum were normal. One of       the polyps was removed with cold biopsy technique. Also mucosa biopsied       for H. pylori testing Impression:               - Normal esophagus.                           - A single gastric polyp. Status post biopsy.                            Status post gastric mucosal biopsy.                           - Normal duodenal bulb and second portion of the                            duodenum.                           -Patient may need further evaluation of her upper  abdominal symptoms. Moderate Sedation:      Moderate (conscious) sedation was personally administered by an       anesthesia professional. The following parameters were monitored: oxygen       saturation, heart rate, blood pressure, respiratory rate, EKG, adequacy       of pulmonary ventilation, and response to care. Recommendation:           - Patient has a contact number available for                            emergencies. The signs and symptoms of potential                            delayed complications were discussed with the                            patient. Return to normal activities tomorrow.                            Written discharge instructions were provided to the                             patient.                           - Resume previous diet.                           - Continue present medications.                           - Return to my office in 1 month. Follow-up on                            pathology. As discussed with the patient                            preprocedure, further diagnostic studies may be                            required. Procedure Code(s):        --- Professional ---                           [phone], Esophagogastroduodenoscopy, flexible,                            transoral; diagnostic, including collection of                            specimen(s) by brushing or washing, when performed                            (separate procedure) Diagnosis Code(s):        --- Professional ---  K31.7, Polyp of stomach and duodenum                           R10.13, Epigastric pain CPT copyright 2019 [employer] Medical [employer]. All rights reserved. The codes documented in this report are preliminary and upon coder review may  be revised to meet current compliance requirements. Cristopher Estimable. Katha Kuehne, MD Norvel Richards, MD 04/21/2020 11:13:13 AM This report has been signed electronically. Number of Addenda: 0

## 2020-04-22 ENCOUNTER — Encounter: Payer: Self-pay | Admitting: Family Medicine

## 2020-04-22 ENCOUNTER — Other Ambulatory Visit: Payer: Self-pay

## 2020-04-22 ENCOUNTER — Other Ambulatory Visit: Payer: Self-pay | Admitting: Gastroenterology

## 2020-04-22 MED ORDER — DEXILANT 60 MG PO CPDR: 60.0000 mg | DELAYED_RELEASE_CAPSULE | Freq: Every day | ORAL | 5 refills | Status: DC

## 2020-04-22 NOTE — Progress Notes (Signed)
Dexilant sent to pharmacy 

## 2020-04-23 ENCOUNTER — Encounter: Payer: Self-pay | Admitting: Internal Medicine

## 2020-04-23 ENCOUNTER — Encounter: Payer: Self-pay | Admitting: Orthopaedic Surgery

## 2020-04-23 LAB — SURGICAL PATHOLOGY

## 2020-04-23 NOTE — Telephone Encounter (Signed)
I called patient and suggested her to try using any of the rubs we usually suggest and she stated she had with no relief. I suggested that maybe trying the ice and then massaging the rub in again She agree with the suggestion, and stated she would get the Voltaren and try it.  Please advise

## 2020-04-24 ENCOUNTER — Encounter (HOSPITAL_COMMUNITY): Payer: Self-pay | Admitting: Internal Medicine

## 2020-05-01 ENCOUNTER — Ambulatory Visit: Payer: Medicare Other | Admitting: Gastroenterology

## 2020-05-06 ENCOUNTER — Encounter: Payer: Self-pay | Admitting: Gastroenterology

## 2020-05-19 ENCOUNTER — Other Ambulatory Visit: Payer: Self-pay | Admitting: "Endocrinology

## 2020-05-19 ENCOUNTER — Other Ambulatory Visit: Payer: Self-pay | Admitting: Family Medicine

## 2020-05-20 ENCOUNTER — Ambulatory Visit: Payer: Medicare Other | Admitting: Family Medicine

## 2020-05-21 DIAGNOSIS — M47816 Spondylosis without myelopathy or radiculopathy, lumbar region: Secondary | ICD-10-CM | POA: Diagnosis not present

## 2020-05-22 ENCOUNTER — Telehealth: Payer: Self-pay

## 2020-05-22 NOTE — Telephone Encounter (Signed)
Please advise 

## 2020-05-22 NOTE — Telephone Encounter (Signed)
Please schedule patient a ov for next week to discuss loss of appetite per Dr.Simpson. Thank you

## 2020-05-22 NOTE — Telephone Encounter (Signed)
PT stopped by , states she has no desire to eat, but she is drinking water. Wants to know what she needs to do to start eating again.Marland Kitchen   Please call

## 2020-05-26 NOTE — Telephone Encounter (Signed)
PT is eating now, but has heat in her belly

## 2020-05-29 ENCOUNTER — Encounter: Payer: Self-pay | Admitting: Internal Medicine

## 2020-05-29 ENCOUNTER — Other Ambulatory Visit: Payer: Self-pay

## 2020-05-29 ENCOUNTER — Ambulatory Visit (INDEPENDENT_AMBULATORY_CARE_PROVIDER_SITE_OTHER): Payer: Medicare Other | Admitting: Internal Medicine

## 2020-05-29 VITALS — BP 148/68 | HR 62 | Resp 16 | Ht 64.0 in | Wt 169.1 lb

## 2020-05-29 DIAGNOSIS — R232 Flushing: Secondary | ICD-10-CM

## 2020-05-29 DIAGNOSIS — E890 Postprocedural hypothyroidism: Secondary | ICD-10-CM

## 2020-05-29 DIAGNOSIS — R630 Anorexia: Secondary | ICD-10-CM

## 2020-05-29 DIAGNOSIS — R634 Abnormal weight loss: Secondary | ICD-10-CM

## 2020-05-29 DIAGNOSIS — Z114 Encounter for screening for human immunodeficiency virus [HIV]: Secondary | ICD-10-CM | POA: Diagnosis not present

## 2020-05-29 DIAGNOSIS — K2950 Unspecified chronic gastritis without bleeding: Secondary | ICD-10-CM

## 2020-05-29 MED ORDER — SUCRALFATE 1 GM/10ML PO SUSP: 1.0000 g | Freq: Three times a day (TID) | ORAL | Status: DC

## 2020-05-29 NOTE — Progress Notes (Signed)
Acute Office Visit  Subjective:    Patient ID: Laura [name], female    DOB: 07-01-1951, 69 y.o.   MRN: [id number]  Chief Complaint  Patient presents with  . not eating    pt has no desire to eat, also has a burning sensation in her abdomen    HPI Patient is a very pleasant 69 year old female with past medical history of hypothyroidism, depression, hyperlipidemia, gastritis, GERD, hypertension presents in our clinic with complaint of burning sensation in her stomach and decreased appetite since March of this year.  Patient tells me that since March 9 she developed burning sensation in her stomach which radiates to all over her chest, arms, legs after eating shrimp and since then she has been experiencing similar symptoms especially at night middle of the night from 3 AM to 5 AM almost every night.  She tells me that she is hungry however she is scared to eat because of her symptoms.  She has lost about 7 pounds recently.  She denies night sweats, abdominal pain, melena, nausea, vomiting, chest pain, shortness of breath, palpitation, fever, chills, cough, congestion, family history of colon cancer/breast cancer.  She underwent upper GI endoscopy on 6/22-gastric polyp was removed.  Histopathology shows mild chronic gastritis.  She takes PPI twice a day with little to no help.  She reports that her burning sensation is not related to food she eats.  She does not eat spicy food, drinks increased caffeinated products including soda.  She tells me that she thinks a lot about her symptoms and which makes her stressed out.  She takes Celexa 20 mg once daily.  She denies history of generalized anxiety disorder/panic attacks/severe depression/suicidal or homicidal thoughts.  No history of smoking, alcohol, illicit drug use.  I reviewed her initial labs including which all came back within normal limits.   Past Medical History:  Diagnosis Date  . Anxiety   . At low risk for fall 08/15/2018  .  Cystitis   . Depression   . Gastritis   . GERD (gastroesophageal reflux disease)   . Hashimoto's thyroiditis    Hx   . Hyperlipidemia   . Hypertension   . Hypothyroidism   . Normocytic anemia 2009 Hb 9.9-11.1  . Osteopenia   . Shoulder pain   . Urinary incontinence     Past Surgical History:  Procedure Laterality Date  . Bilateral tubal ligation    . BIOPSY  04/21/2020   Procedure: BIOPSY;  Surgeon: Daneil Dolin, MD;  Location: [facility];  Service: Endoscopy;;  . BREAST EXCISIONAL BIOPSY  2010   Excisional biopsy of benign left breast mass -lopoma   . BREAST SURGERY     left nreast biopsy  . COLONOSCOPY  08/26/2011   SLF: 1. Internal hemorrhoids  . COLONOSCOPY N/A 10/06/2015   IFO:YDXA fissure or internal hemorrhoids/mild sigmoid colitis. benign colonic path   . colonscopy  2005   Dr. Tamala Julian  . ESOPHAGOGASTRODUODENOSCOPY  12/2009   chronic gastritis  . ESOPHAGOGASTRODUODENOSCOPY N/A 05/17/2017   Dr. Oneida Alar; gastritis, normal small bowel biopsies, multiple gastric polyps. fundic gland polyps  . ESOPHAGOGASTRODUODENOSCOPY (EGD) WITH PROPOFOL N/A 04/21/2020   Procedure: ESOPHAGOGASTRODUODENOSCOPY (EGD) WITH PROPOFOL;  Surgeon: Daneil Dolin, MD;  Location: [facility];  Service: Endoscopy;  Laterality: N/A;  10:00am  . GIVENS CAPSULE STUDY N/A 05/30/2017   gastritis, no source for anemia identified  . left knee surgery Left March 1,2017  . PARTIAL KNEE ARTHROPLASTY Left 08/09/2017  Procedure: LEFT UNICOMPARTMENTAL KNEE;  Surgeon: Gaynelle Arabian, MD;  Location: [facility];  Service: Orthopedics;  Laterality: Left;  . POLYPECTOMY  04/21/2020   Procedure: POLYPECTOMY;  Surgeon: Daneil Dolin, MD;  Location: [facility];  Service: Endoscopy;;  gastric  . Resection of left lobe of thyroid    . right carpal tunnel release    . rt. knee athroscopy  2004  . SAVORY DILATION N/A 05/17/2017   Procedure: SAVORY DILATION;  Surgeon: Danie Binder, MD;  Location: [facility];   Service: Endoscopy;  Laterality: N/A;  . TOTAL ABDOMINAL HYSTERECTOMY  1994  . UMBILICAL HERNIA REPAIR    . Urethral dilation for stenosis  2009    Family History  Problem Relation Age of Onset  . Pneumonia Mother   . Healthy Father   . Colon cancer Neg Hx   . Anesthesia problems Neg Hx   . Hypotension Neg Hx   . Malignant hyperthermia Neg Hx   . Pseudochol deficiency Neg Hx     Social History   Socioeconomic History  . Marital status: Married    Spouse name: Not on file  . Number of children: 3  . Years of education: Not on file  . Highest education level: Not on file  Occupational History  . Occupation: Production manager producing automobile parts   Tobacco Use  . Smoking status: Former Smoker    Packs/day: 0.25    Years: 1.00    Pack years: 0.25    Types: Cigarettes    Quit date: 08/25/1972    Years since quitting: 47.7  . Smokeless tobacco: Never Used  Vaping Use  . Vaping Use: Never used  Substance and Sexual Activity  . Alcohol use: No  . Drug use: No  . Sexual activity: Yes    Birth control/protection: Surgical    Comment: married  Other Topics Concern  . Not on file  Social History Narrative  . Not on file   Social Determinants of Health   Financial Resource Strain:   . Difficulty of Paying Living Expenses:   Food Insecurity:   . Worried About Charity fundraiser in the Last Year:   . Arboriculturist in the Last Year:   Transportation Needs:   . Film/video editor (Medical):   Marland Kitchen Lack of Transportation (Non-Medical):   Physical Activity:   . Days of Exercise per Week:   . Minutes of Exercise per Session:   Stress:   . Feeling of Stress :   Social Connections:   . Frequency of Communication with Friends and Family:   . Frequency of Social Gatherings with Friends and Family:   . Attends Religious Services:   . Active Member of Clubs or Organizations:   . Attends Archivist Meetings:   Marland Kitchen Marital Status:   Intimate Partner Violence:    . Fear of Current or Ex-Partner:   . Emotionally Abused:   Marland Kitchen Physically Abused:   . Sexually Abused:     Outpatient Medications Prior to Visit  Medication Sig Dispense Refill  . acetaminophen (TYLENOL) 500 MG tablet Take 1 tablet (500 mg total) by mouth every 6 (six) hours as needed. (Patient taking differently: Take 500 mg by mouth every 6 (six) hours as needed for mild pain. ) 30 tablet 0  . amLODipine (NORVASC) 2.5 MG tablet TAKE 1 TABLET(2.5 MG) BY MOUTH DAILY (Patient taking differently: Take 2.5 mg by mouth daily. ) 90 tablet 1  .  atorvastatin (LIPITOR) 80 MG tablet Take 1 tablet (80 mg total) by mouth daily. (Patient taking differently: Take 40 mg by mouth daily. ) 90 tablet 3  . citalopram (CELEXA) 20 MG tablet TAKE 1 TABLET BY MOUTH  DAILY (Patient taking differently: Take 20 mg by mouth daily. ) 90 tablet 3  . dexlansoprazole (DEXILANT) 60 MG capsule Take 1 capsule (60 mg total) by mouth daily. 30 capsule 5  . gabapentin (NEURONTIN) 100 MG capsule Take 1 capsule (100 mg total) by mouth at bedtime. 90 capsule 3  . hydrochlorothiazide (HYDRODIURIL) 25 MG tablet TAKE 1 TABLET BY MOUTH  DAILY 90 tablet 0  . levothyroxine (SYNTHROID) 112 MCG tablet TAKE 1 TABLET BY MOUTH  DAILY BEFORE BREAKFAST 90 tablet 3  . linaclotide (LINZESS) 72 MCG capsule Take 1 capsule (72 mcg total) by mouth daily before breakfast. (Patient taking differently: Take 72 mcg by mouth daily as needed (constipation.). ) 90 capsule 3  . loratadine (CLARITIN) 10 MG tablet Take 1 tablet (10 mg total) by mouth daily. (Patient taking differently: Take 10 mg by mouth daily as needed for allergies. ) 30 tablet 3  . pantoprazole (PROTONIX) 40 MG tablet TAKE 1 TABLET BY MOUTH  TWICE DAILY (Patient taking differently: Take 40 mg by mouth 2 (two) times daily. ) 180 tablet 3  . Polyethyl Glycol-Propyl Glycol (SYSTANE) 0.4-0.3 % SOLN Apply 1 drop to eye 3 (three) times daily as needed (for dry eyes.).    Marland Kitchen Probiotic Product  (PROBIOTIC ADVANCED PO) Take 1 capsule by mouth daily. [facility]    . triamcinolone cream (KENALOG) 0.1 % Apply twice daily to rash  On right leg for 5 days , then as needed (Patient taking differently: Apply 1 application topically 2 (two) times daily as needed (rash.). ) 45 g 0  . dexlansoprazole (DEXILANT) 60 MG capsule Take 60 mg by mouth daily before breakfast. (Patient not taking: Reported on 05/29/2020)     No facility-administered medications prior to visit.    Allergies  Allergen Reactions  . Cozaar [Losartan Potassium] Cough  . Dilaudid [Hydromorphone Hcl] Other (See Comments)    Confusion;altered state of mind  . Levaquin [Levofloxacin] Other (See Comments)    Dry throat, no raste  . Nsaids Other (See Comments)    REACTION: per GI pt no longer to use NSAIDS REACTION: per GI pt no longer to use NSAIDS  . Promethazine     Legs itching, RESTLESS LEGS    Review of Systems  Constitutional: Positive for appetite change and unexpected weight change.  HENT: Negative.   Eyes: Negative.   Respiratory: Negative.   Cardiovascular: Negative.   Gastrointestinal: Negative.   Endocrine: Negative.   Genitourinary: Negative.   Musculoskeletal: Negative.   Allergic/Immunologic: Negative.   Neurological: Negative.   Hematological: Negative.   Psychiatric/Behavioral: Negative.  Hallucinations:         Objective:    Physical Exam Constitutional:      Appearance: Normal appearance.  HENT:     Head: Normocephalic and atraumatic.     Nose: Nose normal.     Mouth/Throat:     Mouth: Mucous membranes are moist.  Eyes:     Extraocular Movements: Extraocular movements intact.     Conjunctiva/sclera: Conjunctivae normal.     Pupils: Pupils are equal, round, and reactive to light.  Cardiovascular:     Rate and Rhythm: Normal rate and regular rhythm.     Pulses: Normal pulses.     Heart sounds:  Normal heart sounds.  Pulmonary:     Effort: Pulmonary effort is normal.      Breath sounds: Normal breath sounds.  Abdominal:     General: Bowel sounds are normal.     Palpations: Abdomen is soft.  Musculoskeletal:        General: Normal range of motion.     Cervical back: Normal range of motion and neck supple.  Neurological:     General: No focal deficit present.     Mental Status: She is alert and oriented to person, place, and time.  Psychiatric:        Mood and Affect: Mood normal.        Behavior: Behavior normal.        Thought Content: Thought content normal.        Judgment: Judgment normal.     BP (!) 148/68   Pulse 62   Resp 16   Ht _0  (1.626 m)   Wt 169 lb 1.3 oz (76.7 kg)   SpO2 98%   BMI 29.02 kg/m  Wt Readings from Last 3 Encounters:  05/29/20 169 lb 1.3 oz (76.7 kg)  04/21/20 175 lb (79.4 kg)  04/14/20 173 lb (78.5 kg)    There are no preventive care reminders to display for this patient.  There are no preventive care reminders to display for this patient.   Lab Results  Component Value Date   TSH 3.31 12/31/2019   Lab Results  Component Value Date   WBC 4.7 04/14/2020   HGB 11.5 (L) 04/14/2020   HCT 35.6 04/14/2020   MCV 86.2 04/14/2020   PLT 293 04/14/2020   Lab Results  Component Value Date   NA 138 03/11/2020   K 3.6 03/11/2020   CO2 31 03/11/2020   GLUCOSE 107 (H) 03/11/2020   BUN 9 03/11/2020   CREATININE 0.95 03/11/2020   BILITOT 0.6 03/11/2020   ALKPHOS 65 03/03/2020   AST 16 03/11/2020   ALT 14 03/11/2020   PROT 7.1 03/11/2020   ALBUMIN 4.2 03/03/2020   CALCIUM 9.9 03/11/2020   ANIONGAP 12 03/03/2020   Lab Results  Component Value Date   CHOL 224 (H) 02/10/2020   Lab Results  Component Value Date   HDL 60 02/10/2020   Lab Results  Component Value Date   LDLCALC 143 (H) 02/10/2020   Lab Results  Component Value Date   TRIG 99 02/10/2020   Lab Results  Component Value Date   CHOLHDL 3.7 02/10/2020   Lab Results  Component Value Date   HGBA1C 5.2 03/11/2020       Assessment  & Plan:   Problem List Items Addressed This Visit      Cardiovascular and Mediastinum   Hot flashes - Primary   Relevant Orders   Lipase   TSH     Endocrine   POSTSURGICAL HYPOTHYROIDISM   Relevant Orders   TSH    Other Visit Diagnoses    Other chronic gastritis without hemorrhage       Relevant Medications   sucralfate (CARAFATE) 1 GM/10ML suspension 1 g (Start on 05/29/2020 12:00 PM)   Other Relevant Orders   CMP14+EGFR   Lipase   Encounter for HIV (human immunodeficiency virus) test       Relevant Orders   HIV Antibody (routine testing w rflx)   Decreased appetite       Weight loss           Hot flashes:  -Her symptoms are  likely related to hot flashes.  She wakes up in the middle of night and has burning sensation in her stomach which radiates to all over her body. -Discussed lifestyle modification. -She is already takes citalopram 20 mg once daily. -Could try Low dose Paxil 7.5 mg daily VS venlafaxine VS hormonal therapy for hot flushes.  -Check TSH, lipase, CMP. -Follow-up in 4 weeks.  Chronic gastritis: Reviewed recent EGD.  Polyp was removed.  Pathology shows mild chronic gastritis.  Continue PPI twice daily.  Start patient on Carafate -Discussed about dietary modification.  Decreased appetite/weight loss: -Reviewed recent EGD and labs.  Likely secondary to underlying anxiety/stress due to her persistent symptoms. -Check TSH, HIV, lipase, CMP -Encouraged Ensure boost shakes to help with energy and appetite.  Meds ordered this encounter  Medications  . sucralfate (CARAFATE) 1 GM/10ML suspension 1 g     Mckinley Jewel, MD

## 2020-05-29 NOTE — Patient Instructions (Addendum)
Check CMP, lipase, TSH & HIV today Increase fluid intake Drink ensure shakes to help with weight gain Follow up with Dr. Lodema Hong in August as scheduled

## 2020-05-30 ENCOUNTER — Encounter: Payer: Self-pay | Admitting: Family Medicine

## 2020-05-30 LAB — CMP14+EGFR
ALT: 34 IU/L — ABNORMAL HIGH (ref 0–32)
AST: 24 IU/L (ref 0–40)
Albumin/Globulin Ratio: 1.6 (ref 1.2–2.2)
Albumin: 4.6 g/dL (ref 3.8–4.8)
Alkaline Phosphatase: 90 IU/L (ref 48–121)
BUN/Creatinine Ratio: 13 (ref 12–28)
BUN: 13 mg/dL (ref 8–27)
Bilirubin Total: 0.6 mg/dL (ref 0.0–1.2)
CO2: 26 mmol/L (ref 20–29)
Calcium: 10 mg/dL (ref 8.7–10.3)
Chloride: 97 mmol/L (ref 96–106)
Creatinine, Ser: 1.02 mg/dL — ABNORMAL HIGH (ref 0.57–1.00)
GFR calc Af Amer: 65 mL/min/{1.73_m2} (ref >59)
GFR calc non Af Amer: 56 mL/min/{1.73_m2} — ABNORMAL LOW (ref >59)
Globulin, Total: 2.8 g/dL (ref 1.5–4.5)
Glucose: 91 mg/dL (ref 65–99)
Potassium: 3.1 mmol/L — ABNORMAL LOW (ref 3.5–5.2)
Sodium: 138 mmol/L (ref 134–144)
Total Protein: 7.4 g/dL (ref 6.0–8.5)

## 2020-05-30 LAB — HIV ANTIBODY (ROUTINE TESTING W REFLEX): HIV Screen 4th Generation wRfx: NONREACTIVE

## 2020-05-30 LAB — TSH: TSH: 0.421 u[IU]/mL — ABNORMAL LOW (ref 0.450–4.500)

## 2020-05-30 LAB — LIPASE: Lipase: 24 U/L (ref 14–72)

## 2020-06-04 DIAGNOSIS — M5440 Lumbago with sciatica, unspecified side: Secondary | ICD-10-CM | POA: Diagnosis not present

## 2020-06-04 DIAGNOSIS — M47816 Spondylosis without myelopathy or radiculopathy, lumbar region: Secondary | ICD-10-CM | POA: Diagnosis not present

## 2020-06-11 ENCOUNTER — Telehealth: Payer: Self-pay | Admitting: "Endocrinology

## 2020-06-11 ENCOUNTER — Other Ambulatory Visit: Payer: Self-pay | Admitting: Internal Medicine

## 2020-06-11 MED ORDER — LEVOTHYROXINE SODIUM 100 MCG PO TABS: 100.0000 ug | ORAL_TABLET | Freq: Every day | ORAL | 3 refills | Status: DC

## 2020-06-11 MED ORDER — POTASSIUM CHLORIDE ER 10 MEQ PO TBCR: 10.0000 meq | EXTENDED_RELEASE_TABLET | Freq: Every day | ORAL | 0 refills | Status: DC

## 2020-06-11 NOTE — Telephone Encounter (Signed)
Discussed TSH results from 05/29/20 with Dr.Nida, he was in agreement with change in medication. Pt made aware.

## 2020-06-11 NOTE — Telephone Encounter (Signed)
Ok. We need a copy of her labs if possible.

## 2020-06-11 NOTE — Telephone Encounter (Signed)
Patient is calling and states she had an appointment at [facility] and they checked her thyroid levels and it was high and changed her medicine from 112 to 100. Just wanted to make Dr. Fransico Him aware  levothyroxine (SYNTHROID) 100 MCG tablet

## 2020-06-15 ENCOUNTER — Ambulatory Visit: Payer: Medicare Other | Admitting: Gastroenterology

## 2020-06-15 ENCOUNTER — Telehealth (INDEPENDENT_AMBULATORY_CARE_PROVIDER_SITE_OTHER): Payer: Medicare Other | Admitting: Family Medicine

## 2020-06-15 ENCOUNTER — Other Ambulatory Visit: Payer: Self-pay

## 2020-06-15 DIAGNOSIS — K219 Gastro-esophageal reflux disease without esophagitis: Secondary | ICD-10-CM | POA: Diagnosis not present

## 2020-06-15 DIAGNOSIS — I10 Essential (primary) hypertension: Secondary | ICD-10-CM | POA: Diagnosis not present

## 2020-06-15 DIAGNOSIS — F322 Major depressive disorder, single episode, severe without psychotic features: Secondary | ICD-10-CM | POA: Diagnosis not present

## 2020-06-15 MED ORDER — CITALOPRAM HYDROBROMIDE 40 MG PO TABS
40.0000 mg | ORAL_TABLET | Freq: Every day | ORAL | 3 refills | Status: DC
Start: 2020-06-15 — End: 2020-11-23

## 2020-06-15 MED ORDER — MIRTAZAPINE 7.5 MG PO TABS
7.5000 mg | ORAL_TABLET | Freq: Every day | ORAL | 2 refills | Status: DC
Start: 2020-06-15 — End: 2020-06-29

## 2020-06-15 MED ORDER — POTASSIUM CHLORIDE CRYS ER 20 MEQ PO TBCR
20.0000 meq | EXTENDED_RELEASE_TABLET | Freq: Every day | ORAL | 3 refills | Status: DC
Start: 2020-06-15 — End: 2020-08-26

## 2020-06-15 NOTE — Progress Notes (Signed)
Virtual Visit via Telephone Note  I connected with Laura [name] on 06/15/20 at 10:00 AM EDT by telephone and verified that I am speaking with the correct person using two identifiers.  Location: Patient: home Provider: office   I discussed the limitations, risks, security and privacy concerns of performing an evaluation and management service by telephone and the availability of in person appointments. I also discussed with the patient that there may be a patient responsible charge related to this service. The patient expressed understanding and agreed to proceed.   History of Present Illness: C/o fatigue, burning in stomach, poor appetite and weight loss, ongoing for months, and being followed by GI, has not filled the carafte and dexilant last prescribed and when I try to prescribe the dexilant the cost is prohibitive, I advise her to speak with GI   Observations/Objective: There were no vitals taken for this visit. Good communication with no confusion and intact memory. Alert and oriented x 3 No signs of respiratory distress during speech    Assessment and Plan:  Depression, major, single episode, severe (HCC) Increase dose of Celexa and add Remeron at bedtime for sleep and appetite also  Essential hypertension Controlled, no change in medication   GERD Uncontrolled, unable to afford medication, needs to contact gI   Follow Up Instructions:    I discussed the assessment and treatment plan with the patient. The patient was provided an opportunity to ask questions and all were answered. The patient agreed with the plan and demonstrated an understanding of the instructions.   The patient was advised to call back or seek an in-person evaluation if the symptoms worsen or if the condition fails to improve as anticipated.  I provided 20 minutes of non-face-to-face time during this encounter.   Syliva Overman, MD

## 2020-06-15 NOTE — Patient Instructions (Addendum)
F/u as before, call if you need me before  Higher dose of celexa is 40 mg one daily,  New at bedtime for appetite is Remeron 7.5 mg   Start potassium 20 meq One daily, this is prescribed  Let gI know you have not filled the dexilant and cost involved  Schedule things to do between 8 and 9:30 so your bedtime moves to 9: 30  Schedule daily walks with yourhusband or some form of physical activity to help you " get up and go"  It is important that you exercise regularly at least 30 minutes 5 times a week. If you develop chest pain, have severe difficulty breathing, or feel very tired, stop exercising immediately and seek medical attention    Think about what you will eat, plan ahead. Choose " clean, green, fresh or frozen" over canned, processed or packaged foods which are more sugary, salty and fatty. 70 to 75% of food eaten should be vegetables and fruit. Three meals at set times with snacks allowed between meals, but they must be fruit or vegetables. Aim to eat over a 12 hour period , example 7 am to 7 pm, and STOP after  your last meal of the day. Drink water,generally about 64 ounces per day, no other drink is as healthy. Fruit juice is best enjoyed in a healthy way, by EATING the fruit. Thanks for choosing [facility], we consider it a privelige to serve you.

## 2020-06-18 ENCOUNTER — Telehealth: Payer: Self-pay

## 2020-06-18 NOTE — Telephone Encounter (Signed)
Pt stated that the medication worked and her appetite is back the medication just had a side effect of inner ear.

## 2020-06-18 NOTE — Telephone Encounter (Signed)
Noted. Pt wanted to let us know she has her appetite back

## 2020-06-21 ENCOUNTER — Encounter: Payer: Self-pay | Admitting: Family Medicine

## 2020-06-21 DIAGNOSIS — F324 Major depressive disorder, single episode, in partial remission: Secondary | ICD-10-CM | POA: Insufficient documentation

## 2020-06-21 DIAGNOSIS — F322 Major depressive disorder, single episode, severe without psychotic features: Secondary | ICD-10-CM | POA: Insufficient documentation

## 2020-06-21 NOTE — Assessment & Plan Note (Signed)
Increase dose of Celexa and add Remeron at bedtime for sleep and appetite also

## 2020-06-21 NOTE — Assessment & Plan Note (Signed)
Controlled, no change in medication  

## 2020-06-21 NOTE — Assessment & Plan Note (Signed)
Uncontrolled, unable to afford medication, needs to contact gI

## 2020-06-22 ENCOUNTER — Encounter: Payer: Self-pay | Admitting: Family Medicine

## 2020-06-22 DIAGNOSIS — E785 Hyperlipidemia, unspecified: Secondary | ICD-10-CM | POA: Diagnosis not present

## 2020-06-22 DIAGNOSIS — D649 Anemia, unspecified: Secondary | ICD-10-CM | POA: Diagnosis not present

## 2020-06-22 DIAGNOSIS — I10 Essential (primary) hypertension: Secondary | ICD-10-CM | POA: Diagnosis not present

## 2020-06-29 ENCOUNTER — Other Ambulatory Visit: Payer: Self-pay

## 2020-06-29 ENCOUNTER — Encounter: Payer: Self-pay | Admitting: Gastroenterology

## 2020-06-29 ENCOUNTER — Ambulatory Visit (INDEPENDENT_AMBULATORY_CARE_PROVIDER_SITE_OTHER): Payer: Medicare Other | Admitting: Gastroenterology

## 2020-06-29 VITALS — BP 147/85 | HR 71 | Temp 96.9°F | Ht 64.0 in | Wt 169.0 lb

## 2020-06-29 DIAGNOSIS — R634 Abnormal weight loss: Secondary | ICD-10-CM

## 2020-06-29 DIAGNOSIS — K5900 Constipation, unspecified: Secondary | ICD-10-CM | POA: Diagnosis not present

## 2020-06-29 DIAGNOSIS — K219 Gastro-esophageal reflux disease without esophagitis: Secondary | ICD-10-CM | POA: Diagnosis not present

## 2020-06-29 MED ORDER — SUCRALFATE 1 GM/10ML PO SUSP
1.0000 g | Freq: Three times a day (TID) | ORAL | 0 refills | Status: DC | PRN
Start: 2020-06-29 — End: 2020-08-13

## 2020-06-29 MED ORDER — OMEPRAZOLE 20 MG PO CPDR: 20.0000 mg | DELAYED_RELEASE_CAPSULE | Freq: Two times a day (BID) | ORAL | 3 refills | Status: DC

## 2020-06-29 NOTE — Progress Notes (Signed)
Primary Care Physician: Kerri Perches, MD  Primary Gastroenterologist:  Hennie Duos. Marletta Lor, DO   Chief Complaint  Patient presents with  . gastroenteritis    pp f/u; has "hot flashes" every night    HPI: Laura [name] is a 69 y.o. female here for follow-up.  Formerly a Dr. Evelina Dun patient.  Seen in the office April 14, 2020 for GERD, constipation, history of anemia, nausea, history of abdominal pain with new nocturnal hot flashes.  Prior evaluation including capsule study, normal GES, CT June 3 without acute findings, right upper quadrant ultrasound in 2019 with simple hepatic cyst but no gallstones or biliary distention..  Completed EGD since her last office visit which showed mild chronic gastritis but no H. pylori, fundic gland polyps.  Patient states the symptoms started back in March 2021.  Initially occurred after eating fried shrimp at [employer].  She initially thought it was something like to poisoning but her symptoms have persisted now almost nightly for 5 months.  She describes waking up every night with sensation of burning in the epigastrium which then radiates upward into the chest and both shoulders, then radiates down both arms.  She denies diaphoresis or shortness of breath.  Symptoms generally last for less than an hour.  Once or twice she tried taking an antacid or Gas-X but no relief.  Typically she just lays in bed and waits for it to pass.  Afterwards she will feel cold.  Denies any associated nausea or vomiting.  States the symptoms are different than when she went through menopause years ago.  She briefly tried Dexilant but was not able to afford the medication.  She felt like it helped with her reflux during the day but not her nighttime symptoms.  She tried some over-the-counter omeprazole which may be helped but she was afraid to take it without a doctor's prescription or advice.  She denies postprandial symptoms although notes that she is afraid to eat for fear  that it may make things worse.  She gets stressed out about not knowing what is causing her symptoms.  Denies dysphagia.  Bowel movements are good.  No melena or rectal bleeding.  Takes Linzess as needed.States she has been on pantoprazole for years.  She has been on 40 mg twice daily before the symptoms even started.    Today she states she has some "residual" symptoms from her episode that occurred around 6 AM.  She reiterates several times that she does not have these hot flashes during the daytime.  Symptoms do not appear to be related to meals although she is afraid of eating.  When she eats she does not have any pain.  She denies any exertional chest pain or diaphoresis, shortness of breath.  She has had some back issues, received an injection recently which seemed to help but did not last long.  A few weeks ago her thyroid medication was reduced due to low TSH but she does not appreciate any improvement of her symptoms.    Since May 2021 she has lost 14 pounds.  August 2020: 175 pounds May 2021: 183 pounds June 2021: 175 pounds Today:169 pounds.  Current Outpatient Medications  Medication Sig Dispense Refill  . acetaminophen (TYLENOL) 500 MG tablet Take 1 tablet (500 mg total) by mouth every 6 (six) hours as needed. (Patient taking differently: Take 500 mg by mouth every 6 (six) hours as needed for mild pain. ) 30 tablet 0  . amLODipine (  NORVASC) 2.5 MG tablet TAKE 1 TABLET(2.5 MG) BY MOUTH DAILY (Patient taking differently: Take 2.5 mg by mouth daily. ) 90 tablet 1  . atorvastatin (LIPITOR) 80 MG tablet Take 1 tablet (80 mg total) by mouth daily. (Patient taking differently: Take 40 mg by mouth daily. ) 90 tablet 3  . citalopram (CELEXA) 40 MG tablet Take 1 tablet (40 mg total) by mouth daily. 30 tablet 3  . gabapentin (NEURONTIN) 100 MG capsule Take 1 capsule (100 mg total) by mouth at bedtime. 90 capsule 3  . hydrochlorothiazide (HYDRODIURIL) 25 MG tablet TAKE 1 TABLET BY MOUTH  DAILY  90 tablet 0  . levothyroxine (SYNTHROID) 100 MCG tablet Take 1 tablet (100 mcg total) by mouth daily. 90 tablet 3  . linaclotide (LINZESS) 72 MCG capsule Take 1 capsule (72 mcg total) by mouth daily before breakfast. (Patient taking differently: Take 72 mcg by mouth daily as needed (constipation.). ) 90 capsule 3  . loratadine (CLARITIN) 10 MG tablet Take 1 tablet (10 mg total) by mouth daily. (Patient taking differently: Take 10 mg by mouth daily as needed for allergies. ) 30 tablet 3  . Polyethyl Glycol-Propyl Glycol (SYSTANE) 0.4-0.3 % SOLN Apply 1 drop to eye 3 (three) times daily as needed (for dry eyes.).    Marland Kitchen. potassium chloride SA (KLOR-CON) 20 MEQ tablet Take 1 tablet (20 mEq total) by mouth daily. 30 tablet 3  . Probiotic Product (PROBIOTIC ADVANCED PO) Take 1 capsule by mouth daily. [employer] [facility]    . triamcinolone cream (KENALOG) 0.1 % Apply twice daily to rash  On right leg for 5 days , then as needed (Patient taking differently: Apply 1 application topically 2 (two) times daily as needed (rash.). ) 45 g 0  . omeprazole (PRILOSEC) 20 MG capsule Take 1 capsule (20 mg total) by mouth 2 (two) times daily before a meal. 60 capsule 3  . potassium chloride (KLOR-CON) 10 MEQ tablet Take 1 tablet (10 mEq total) by mouth daily for 5 days. 5 tablet 0  . sucralfate (CARAFATE) 1 GM/10ML suspension Take 10 mLs (1 g total) by mouth 3 (three) times daily as needed. For burning in the stomach or chest 420 mL 0   Current Facility-Administered Medications  Medication Dose Route Frequency Provider Last Rate Last Admin  . sucralfate (CARAFATE) 1 GM/10ML suspension 1 g  1 g Oral TID WC & HS Pahwani, Rinka R, MD        Allergies as of 06/29/2020 - Review Complete 06/29/2020  Allergen Reaction Noted  . Cozaar [losartan potassium] Cough 07/04/2018  . Dilaudid [hydromorphone hcl] Other (See Comments) 03/01/2011  . Levaquin [levofloxacin] Other (See Comments) 07/28/2014  . Nsaids Other (See  Comments) 01/13/2010  . Promethazine  05/17/2017    ROS:  General: Negative for anorexia,   fever, chills, fatigue, weakness.  See HPI ENT: Negative for hoarseness, difficulty swallowing , nasal congestion. CV: Negative for chest pain, angina, palpitations, dyspnea on exertion, peripheral edema.  Respiratory: Negative for dyspnea at rest, dyspnea on exertion, cough, sputum, wheezing.  GI: See history of present illness. GU:  Negative for dysuria, hematuria, urinary incontinence, urinary frequency, nocturnal urination.  Endo: See HPI   Physical Examination:   BP (!) 147/85   Pulse 71   Temp (!) 96.9 F (36.1 C) (Temporal)   Ht 5\' 4"  (1.626 m)   Wt 169 lb (76.7 kg)   BMI 29.01 kg/m   General: Well-nourished, well-developed in no acute distress.  Eyes: No  icterus. Mouth: masked Lungs: Clear to auscultation bilaterally.  Heart: Regular rate and rhythm, no murmurs rubs or gallops.  Abdomen: Bowel sounds are normal,  nondistended, no hepatosplenomegaly or masses, no abdominal bruits or hernia , no rebound or guarding.  Mild epig tenderness to palpation Extremities: No lower extremity edema. No clubbing or deformities. Neuro: Alert and oriented x 4   Skin: Warm and dry, no jaundice.   Psych: Alert and cooperative, normal mood and affect.  Labs:  Lab Results  Component Value Date   TSH 0.421 (L) 05/29/2020   Lab Results  Component Value Date   LIPASE 24 05/29/2020   Lab Results  Component Value Date   CREATININE 1.02 (H) 05/29/2020   BUN 13 05/29/2020   NA 138 05/29/2020   K 3.1 (L) 05/29/2020   CL 97 05/29/2020   CO2 26 05/29/2020   Lab Results  Component Value Date   ALT 34 (H) 05/29/2020   AST 24 05/29/2020   ALKPHOS 90 05/29/2020   BILITOT 0.6 05/29/2020   Lab Results  Component Value Date   WBC 4.7 04/14/2020   HGB 11.5 (L) 04/14/2020   HCT 35.6 04/14/2020   MCV 86.2 04/14/2020   PLT 293 04/14/2020   Lab Results  Component Value Date   IRON 51  04/14/2020   TIBC 258 04/14/2020   FERRITIN 129 04/14/2020   Lab Results  Component Value Date   VITAMINB12 1,175 (H) 03/11/2020   Lab Results  Component Value Date   FOLATE 34.0 07/07/2017   Lab Results  Component Value Date   HGBA1C 5.2 03/11/2020     Imaging Studies: No results found.   Impression/plan:  69 year old female presenting for follow-up today of 2-month history of nocturnal flushing associated with weight loss.  She has chronic GERD, constipation, history of anemia status post thorough evaluation as previously outlined.  Nocturnal flushing:   She has been on pantoprazole 40 mg twice daily for a long time, preceding the onset of the symptoms.  Short course of Dexilant (not affordable) with no improvement.  No changes in reflux management otherwise.  EGD as outlined above, mild gastritis and fundic gland polyps which likely do not explain her symptoms.  CT without acute findings.  She has had a 13 pound weight loss which she feels is related to decreased oral intake for fear of making her symptoms worse.  She admits however that she does not have postprandial abdominal pain or sensation of flushing after meals.  She denies diarrhea.  Takes Linzess as needed for her constipation.  Her typical daytime reflux symptoms are fairly well controlled.  I doubt her symptoms are explained by mild gastritis or GERD at this point.  However, since she has not had any significant change in her reflux management, we will switch her to omeprazole 20 mg before breakfast and before her evening meal.  I have given her a prescription for Carafate to try as needed for burning up to 3 times daily.  She will journal her symptoms and reach out to me via MyChart or phone in 2 weeks and let me know if she has had any improvement.  If no significant improvement in her symptoms, we discussed potential of further work-up to complete her GI evaluation including CTA to rule out mesenteric ischemia although  her symptoms are not classic.  She does have some food aversion and weight loss but denies postprandial diarrhea or abdominal pain.  Symptoms do not sound biliary but could consider  completing gallbladder work-up with HIDA scan.    Flushing may not be secondary to a GI source.  Symptoms sound somewhat atypical for carcinoid syndrome.  May benefit from endocrinology evaluation and/or work-up for carcinoid, mastocytosis, pheochromocytoma, GYN evaluation.  She has had multiple labs done since onset of symptoms.  She reports only medication change was decreasing her levothyroxine in the setting of elevated TSH.

## 2020-06-29 NOTE — Patient Instructions (Addendum)
1. Stop pantoprazole. Start omeprazole 20mg  30 minutes before breakfast and 30 minutes before evening meal.  2. Carafate suspension sent to your pharmacy. You can use this up to three times daily for burning in the stomach and chest. If you wake up at night time with burning, please take a dose.  3. Make sure you wait at least 2 hours between eating and laying down.  4. Journal your symptoms for the next two weeks and then call or send me mychart message. If you have noted no improvement in your symptoms, then we will move towards CT scan of the blood vessels feeding the intestines and/or completing gallbladder work up.    Gastroesophageal Reflux Disease, Adult Gastroesophageal reflux (GER) happens when acid from the stomach flows up into the tube that connects the mouth and the stomach (esophagus). Normally, food travels down the esophagus and stays in the stomach to be digested. However, when a person has GER, food and stomach acid sometimes move back up into the esophagus. If this becomes a more serious problem, the person may be diagnosed with a disease called gastroesophageal reflux disease (GERD). GERD occurs when the reflux:  Happens often.  Causes frequent or severe symptoms.  Causes problems such as damage to the esophagus. When stomach acid comes in contact with the esophagus, the acid may cause soreness (inflammation) in the esophagus. Over time, GERD may create small holes (ulcers) in the lining of the esophagus. What are the causes? This condition is caused by a problem with the muscle between the esophagus and the stomach (lower esophageal sphincter, or LES). Normally, the LES muscle closes after food passes through the esophagus to the stomach. When the LES is weakened or abnormal, it does not close properly, and that allows food and stomach acid to go back up into the esophagus. The LES can be weakened by certain dietary substances, medicines, and medical conditions,  including:  Tobacco use.  Pregnancy.  Having a hiatal hernia.  Alcohol use.  Certain foods and beverages, such as coffee, chocolate, onions, and peppermint. What increases the risk? You are more likely to develop this condition if you:  Have an increased body weight.  Have a connective tissue disorder.  Use NSAID medicines. What are the signs or symptoms? Symptoms of this condition include:  Heartburn.  Difficult or painful swallowing.  The feeling of having a lump in the throat.  Abitter taste in the mouth.  Bad breath.  Having a large amount of saliva.  Having an upset or bloated stomach.  Belching.  Chest pain. Different conditions can cause chest pain. Make sure you see your health care provider if you experience chest pain.  Shortness of breath or wheezing.  Ongoing (chronic) cough or a night-time cough.  Wearing away of tooth enamel.  Weight loss. How is this diagnosed? Your health care provider will take a medical history and perform a physical exam. To determine if you have mild or severe GERD, your health care provider may also monitor how you respond to treatment. You may also have tests, including:  A test to examine your stomach and esophagus with a small camera (endoscopy).  A test thatmeasures the acidity level in your esophagus.  A test thatmeasures how much pressure is on your esophagus.  A barium swallow or modified barium swallow test to show the shape, size, and functioning of your esophagus. How is this treated? The goal of treatment is to help relieve your symptoms and to prevent complications. Treatment  for this condition may vary depending on how severe your symptoms are. Your health care provider may recommend:  Changes to your diet.  Medicine.  Surgery. Follow these instructions at home: Eating and drinking   Follow a diet as recommended by your health care provider. This may involve avoiding foods and drinks such  as: ? Coffee and tea (with or without caffeine). ? Drinks that containalcohol. ? Energy drinks and sports drinks. ? Carbonated drinks or sodas. ? Chocolate and cocoa. ? Peppermint and mint flavorings. ? Garlic and onions. ? Horseradish. ? Spicy and acidic foods, including peppers, chili powder, curry powder, vinegar, hot sauces, and barbecue sauce. ? Citrus fruit juices and citrus fruits, such as oranges, lemons, and limes. ? Tomato-based foods, such as red sauce, chili, salsa, and pizza with red sauce. ? Fried and fatty foods, such as donuts, french fries, potato chips, and high-fat dressings. ? High-fat meats, such as hot dogs and fatty cuts of red and white meats, such as rib eye steak, sausage, ham, and bacon. ? High-fat dairy items, such as whole milk, butter, and cream cheese.  Eat small, frequent meals instead of large meals.  Avoid drinking large amounts of liquid with your meals.  Avoid eating meals during the 2-3 hours before bedtime.  Avoid lying down right after you eat.  Do not exercise right after you eat. Lifestyle   Do not use any products that contain nicotine or tobacco, such as cigarettes, e-cigarettes, and chewing tobacco. If you need help quitting, ask your health care provider.  Try to reduce your stress by using methods such as yoga or meditation. If you need help reducing stress, ask your health care provider.  If you are overweight, reduce your weight to an amount that is healthy for you. Ask your health care provider for guidance about a safe weight loss goal. General instructions  Pay attention to any changes in your symptoms.  Take over-the-counter and prescription medicines only as told by your health care provider. Do not take aspirin, ibuprofen, or other NSAIDs unless your health care provider told you to do so.  Wear loose-fitting clothing. Do not wear anything tight around your waist that causes pressure on your abdomen.  Raise (elevate) the  head of your bed about 6 inches (15 cm).  Avoid bending over if this makes your symptoms worse.  Keep all follow-up visits as told by your health care provider. This is important. Contact a health care provider if:  You have: ? New symptoms. ? Unexplained weight loss. ? Difficulty swallowing or it hurts to swallow. ? Wheezing or a persistent cough. ? A hoarse voice.  Your symptoms do not improve with treatment. Get help right away if you:  Have pain in your arms, neck, jaw, teeth, or back.  Feel sweaty, dizzy, or light-headed.  Have chest pain or shortness of breath.  Vomit and your vomit looks like blood or coffee grounds.  Faint.  Have stool that is bloody or black.  Cannot swallow, drink, or eat. Summary  Gastroesophageal reflux happens when acid from the stomach flows up into the esophagus. GERD is a disease in which the reflux happens often, causes frequent or severe symptoms, or causes problems such as damage to the esophagus.  Treatment for this condition may vary depending on how severe your symptoms are. Your health care provider may recommend diet and lifestyle changes, medicine, or surgery.  Contact a health care provider if you have new or worsening symptoms.  Take  over-the-counter and prescription medicines only as told by your health care provider. Do not take aspirin, ibuprofen, or other NSAIDs unless your health care provider told you to do so.  Keep all follow-up visits as told by your health care provider. This is important. This information is not intended to replace advice given to you by your health care provider. Make sure you discuss any questions you have with your health care provider. Document Revised: 04/25/2018 Document Reviewed: 04/25/2018 Elsevier Patient Education  2020 [employer].

## 2020-06-30 ENCOUNTER — Other Ambulatory Visit: Payer: Self-pay

## 2020-06-30 ENCOUNTER — Encounter: Payer: Self-pay | Admitting: Family Medicine

## 2020-06-30 ENCOUNTER — Ambulatory Visit (INDEPENDENT_AMBULATORY_CARE_PROVIDER_SITE_OTHER): Payer: Medicare Other | Admitting: Family Medicine

## 2020-06-30 VITALS — BP 107/73 | HR 74 | Resp 16 | Ht 63.0 in | Wt 169.0 lb

## 2020-06-30 DIAGNOSIS — F322 Major depressive disorder, single episode, severe without psychotic features: Secondary | ICD-10-CM | POA: Diagnosis not present

## 2020-06-30 DIAGNOSIS — E785 Hyperlipidemia, unspecified: Secondary | ICD-10-CM | POA: Diagnosis not present

## 2020-06-30 DIAGNOSIS — K219 Gastro-esophageal reflux disease without esophagitis: Secondary | ICD-10-CM | POA: Diagnosis not present

## 2020-06-30 DIAGNOSIS — I10 Essential (primary) hypertension: Secondary | ICD-10-CM

## 2020-06-30 NOTE — Patient Instructions (Addendum)
Annual physical exam in office with MD 12/22/or after, call if you need me sooner  Please Start taking Remeron every night for  sleep and appetite  Please STOP ginger ale and all sodas.  Please stop eating 3 hours before bedtime  Please elevate the head of your bed on bricks so you sleep on a slope, less chance of reflux Hoping that you improve over time, all tests done are within normal by the GI Doctor, so that is very good      Thanks for choosing [facility], we consider it a privelige to serve you.    Gastroesophageal Reflux Disease, Adult Gastroesophageal reflux (GER) happens when acid from the stomach flows up into the tube that connects the mouth and the stomach (esophagus). Normally, food travels down the esophagus and stays in the stomach to be digested. With GER, food and stomach acid sometimes move back up into the esophagus. You may have a disease called gastroesophageal reflux disease (GERD) if the reflux:  Happens often.  Causes frequent or very bad symptoms.  Causes problems such as damage to the esophagus. When this happens, the esophagus becomes sore and swollen (inflamed). Over time, GERD can make small holes (ulcers) in the lining of the esophagus. What are the causes? This condition is caused by a problem with the muscle between the esophagus and the stomach. When this muscle is weak or not normal, it does not close properly to keep food and acid from coming back up from the stomach. The muscle can be weak because of:  Tobacco use.  Pregnancy.  Having a certain type of hernia (hiatal hernia).  Alcohol use.  Certain foods and drinks, such as coffee, chocolate, onions, and peppermint. What increases the risk? You are more likely to develop this condition if you:  Are overweight.  Have a disease that affects your connective tissue.  Use NSAID medicines. What are the signs or symptoms? Symptoms of this condition  include:  Heartburn.  Difficult or painful swallowing.  The feeling of having a lump in the throat.  A bitter taste in the mouth.  Bad breath.  Having a lot of saliva.  Having an upset or bloated stomach.  Belching.  Chest pain. Different conditions can cause chest pain. Make sure you see your doctor if you have chest pain.  Shortness of breath or noisy breathing (wheezing).  Ongoing (chronic) cough or a cough at night.  Wearing away of the surface of teeth (tooth enamel).  Weight loss. How is this treated? Treatment will depend on how bad your symptoms are. Your doctor may suggest:  Changes to your diet.  Medicine.  Surgery. Follow these instructions at home: Eating and drinking   Follow a diet as told by your doctor. You may need to avoid foods and drinks such as: ? Coffee and tea (with or without caffeine). ? Drinks that contain alcohol. ? Energy drinks and sports drinks. ? Bubbly (carbonated) drinks or sodas. ? Chocolate and cocoa. ? Peppermint and mint flavorings. ? Garlic and onions. ? Horseradish. ? Spicy and acidic foods. These include peppers, chili powder, curry powder, vinegar, hot sauces, and BBQ sauce. ? Citrus fruit juices and citrus fruits, such as oranges, lemons, and limes. ? Tomato-based foods. These include red sauce, chili, salsa, and pizza with red sauce. ? Fried and fatty foods. These include donuts, french fries, potato chips, and high-fat dressings. ? High-fat meats. These include hot dogs, rib eye steak, sausage, ham, and bacon. ? High-fat  dairy items, such as whole milk, butter, and cream cheese.  Eat small meals often. Avoid eating large meals.  Avoid drinking large amounts of liquid with your meals.  Avoid eating meals during the 2-3 hours before bedtime.  Avoid lying down right after you eat.  Do not exercise right after you eat. Lifestyle   Do not use any products that contain nicotine or tobacco. These include  cigarettes, e-cigarettes, and chewing tobacco. If you need help quitting, ask your doctor.  Try to lower your stress. If you need help doing this, ask your doctor.  If you are overweight, lose an amount of weight that is healthy for you. Ask your doctor about a safe weight loss goal. General instructions  Pay attention to any changes in your symptoms.  Take over-the-counter and prescription medicines only as told by your doctor. Do not take aspirin, ibuprofen, or other NSAIDs unless your doctor says it is okay.  Wear loose clothes. Do not wear anything tight around your waist.  Raise (elevate) the head of your bed about 6 inches (15 cm).  Avoid bending over if this makes your symptoms worse.  Keep all follow-up visits as told by your doctor. This is important. Contact a doctor if:  You have new symptoms.  You lose weight and you do not know why.  You have trouble swallowing or it hurts to swallow.  You have wheezing or a cough that keeps happening.  Your symptoms do not get better with treatment.  You have a hoarse voice. Get help right away if:  You have pain in your arms, neck, jaw, teeth, or back.  You feel sweaty, dizzy, or light-headed.  You have chest pain or shortness of breath.  You throw up (vomit) and your throw-up looks like blood or coffee grounds.  You pass out (faint).  Your poop (stool) is bloody or black.  You cannot swallow, drink, or eat. Summary  If a person has gastroesophageal reflux disease (GERD), food and stomach acid move back up into the esophagus and cause symptoms or problems such as damage to the esophagus.  Treatment will depend on how bad your symptoms are.  Follow a diet as told by your doctor.  Take all medicines only as told by your doctor. This information is not intended to replace advice given to you by your health care provider. Make sure you discuss any questions you have with your health care provider. Document Revised:  04/25/2018 Document Reviewed: 04/25/2018 Elsevier Patient Education  [employer].

## 2020-06-30 NOTE — Progress Notes (Signed)
   PEARLENA OW     MRN: [id number]      DOB: [birth year]   HPI Ms. Tietje is here states the remeron improved appetite but made her drunk, was not taking at night, and is willing to do this and will start at lowest dose   ROS Denies recent fever or chills. Denies sinus pressure, nasal congestion, ear pain or sore throat. Denies chest congestion, productive cough or wheezing. Denies chest pains, palpitations and leg swelling    Denies dysuria, frequency, hesitancy or incontinence. Denies joint pain, swelling and limitation in mobility. Denies headaches, seizures, numbness, or tingling.  Denies skin break down or rash.   PE  BP 107/73   Pulse 74   Resp 16   Ht 5\' 3"  (1.6 m)   Wt 169 lb (76.7 kg)   SpO2 96%   BMI 29.94 kg/m   Patient alert and oriented and in no cardiopulmonary distress.  HEENT: No facial asymmetry, EOMI,     Neck supple .  Chest: Clear to auscultation bilaterally.  CVS: S1, S2 no murmurs, no S3.Regular rate.  ABD: Soft non tender.   Ext: No edema  MS: Adequate ROM spine, shoulders, hips and knees.  Skin: Intact, no ulcerations or rash noted.  Psych: Good eye contact, normal affect. Memory intact not anxious or depressed appearing.  CNS: CN 2-12 intact, power,  normal throughout.no focal deficits noted.   Assessment & Plan  GERD Uncontrolled and being followed by GI, behavioral changes discussed   Essential hypertension Controlled, no change in medication DASH diet and commitment to daily physical activity for a minimum of 30 minutes discussed and encouraged, as a part of hypertension management. The importance of attaining a healthy weight is also discussed.  BP/Weight 06/30/2020 06/29/2020 05/29/2020 04/21/2020 04/14/2020 04/02/2020 03/12/2020  Systolic BP 107 147 148 130 137 120 132  Diastolic BP 73 85 68 70 82 80 84  Wt. (Lbs) 169 169 169.08 175 173 175 177  BMI 29.94 29.01 29.02 30.04 29.7 30.04 30.38       Depression, major, single  episode, severe (HCC) Uncontrolled, needs to commit to remeron at bedtime, needs sooner f/u  Hyperlipidemia LDL goal <100 Hyperlipidemia:Low fat diet discussed and encouraged.   Lipid Panel  Lab Results  Component Value Date   CHOL 224 (H) 02/10/2020   HDL 60 02/10/2020   LDLCALC 143 (H) 02/10/2020   TRIG 99 02/10/2020   CHOLHDL 3.7 02/10/2020

## 2020-07-06 ENCOUNTER — Encounter: Payer: Self-pay | Admitting: Family Medicine

## 2020-07-06 DIAGNOSIS — R1013 Epigastric pain: Secondary | ICD-10-CM

## 2020-07-06 DIAGNOSIS — R110 Nausea: Secondary | ICD-10-CM

## 2020-07-06 DIAGNOSIS — R109 Unspecified abdominal pain: Secondary | ICD-10-CM

## 2020-07-06 NOTE — Assessment & Plan Note (Signed)
Controlled, no change in medication DASH diet and commitment to daily physical activity for a minimum of 30 minutes discussed and encouraged, as a part of hypertension management. The importance of attaining a healthy weight is also discussed.  BP/Weight 06/30/2020 06/29/2020 05/29/2020 04/21/2020 04/14/2020 04/02/2020 03/12/2020  Systolic BP 107 147 148 130 137 120 132  Diastolic BP 73 85 68 70 82 80 84  Wt. (Lbs) 169 169 169.08 175 173 175 177  BMI 29.94 29.01 29.02 30.04 29.7 30.04 30.38

## 2020-07-06 NOTE — Assessment & Plan Note (Signed)
Uncontrolled and being followed by GI, behavioral changes discussed

## 2020-07-06 NOTE — Assessment & Plan Note (Signed)
Hyperlipidemia:Low fat diet discussed and encouraged.   Lipid Panel  Lab Results  Component Value Date   CHOL 224 (H) 02/10/2020   HDL 60 02/10/2020   LDLCALC 143 (H) 02/10/2020   TRIG 99 02/10/2020   CHOLHDL 3.7 02/10/2020

## 2020-07-06 NOTE — Assessment & Plan Note (Signed)
Uncontrolled, needs to commit to remeron at bedtime, needs sooner f/u

## 2020-07-08 NOTE — Telephone Encounter (Signed)
Please schedule HIDA with fatty meal or CCK.   See mychart message, patient requesting.

## 2020-07-09 ENCOUNTER — Telehealth: Payer: Self-pay | Admitting: *Deleted

## 2020-07-09 NOTE — Telephone Encounter (Signed)
PA for HIDA done via [facility] website. Sent for clinical review. Ref# [id number]

## 2020-07-10 NOTE — Telephone Encounter (Signed)
PA approved. Auth# [id number] dates 07/09/2020-08/23/2020

## 2020-07-27 ENCOUNTER — Encounter (HOSPITAL_COMMUNITY): Payer: Self-pay

## 2020-07-27 ENCOUNTER — Other Ambulatory Visit: Payer: Self-pay

## 2020-07-27 ENCOUNTER — Encounter (HOSPITAL_COMMUNITY)
Admission: RE | Admit: 2020-07-27 | Discharge: 2020-07-27 | Disposition: A | Discharge status: Home / Self Care | Payer: Medicare Other | Source: Ambulatory Visit | Attending: Gastroenterology | Admitting: Gastroenterology

## 2020-07-27 DIAGNOSIS — K820 Obstruction of gallbladder: Secondary | ICD-10-CM | POA: Diagnosis not present

## 2020-07-27 DIAGNOSIS — R109 Unspecified abdominal pain: Secondary | ICD-10-CM | POA: Insufficient documentation

## 2020-07-27 DIAGNOSIS — R1013 Epigastric pain: Secondary | ICD-10-CM | POA: Insufficient documentation

## 2020-07-27 DIAGNOSIS — R232 Flushing: Secondary | ICD-10-CM

## 2020-07-27 DIAGNOSIS — R110 Nausea: Secondary | ICD-10-CM | POA: Diagnosis not present

## 2020-07-27 DIAGNOSIS — R634 Abnormal weight loss: Secondary | ICD-10-CM

## 2020-07-27 IMAGING — NM NM HEPATO W/GB/PHARM/[PERSON_NAME]
2 series · 12 of 12 positions shown · non-contrast
Comparison: CT [DATE]

CLINICAL DATA: RIGHT upper quadrant pain.  Post prandial nausea.

EXAM:
NUCLEAR MEDICINE HEPATOBILIARY IMAGING WITH GALLBLADDER EF
TECHNIQUE: Sequential images of the abdomen were obtained [DATE] minutes
following intravenous administration of radiopharmaceutical. After
oral ingestion of Ensure, gallbladder ejection fraction was
determined. At 60 min, normal ejection fraction is greater than 33%.
RADIOPHARMACEUTICALS:  5.3 mCi [1M]  Choletec IV

[Series 1: biliary · 3.25mm/px · 6 of 60 frames shown]
[frame 6/60]
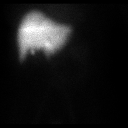
[frame 16/60]
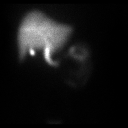
[frame 26/60]
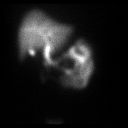
[frame 36/60]
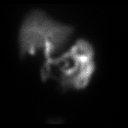
[frame 46/60]
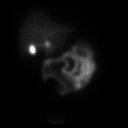
[frame 56/60]
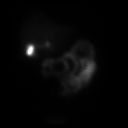

[Series 2: gbef · 3.25mm/px · 6 of 60 frames shown]
[frame 6/60]
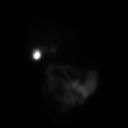
[frame 16/60]
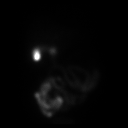
[frame 26/60]
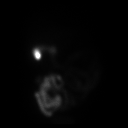
[frame 36/60]
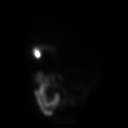
[frame 46/60]
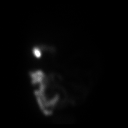
[frame 56/60]
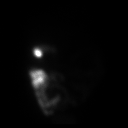

[12 of 12 positions shown; findings below may reference images not displayed]

FINDINGS: Prompt uptake and biliary excretion of activity by the liver is
seen. Gallbladder activity is visualized, consistent with patency of
cystic duct. Biliary activity passes into small bowel, consistent
with patent common bile duct.

Fatty meal was administered subsequent mild contraction of the
gallbladder.

Calculated gallbladder ejection fraction is 27%. (Normal gallbladder
ejection fraction with Ensure is greater than 33%.)
IMPRESSION: 1. Low gallbladder ejection fraction as above.
2. Patent cystic duct and common bile duct.

## 2020-07-27 MED ORDER — TECHNETIUM TC 99M MEBROFENIN IV KIT
5.0000 | PACK | Freq: Once | INTRAVENOUS | Status: AC
  Administered 2020-07-27: 5.25 via INTRAVENOUS

## 2020-08-03 ENCOUNTER — Telehealth: Payer: Self-pay

## 2020-08-03 NOTE — Telephone Encounter (Signed)
I have read over patient's summary/diary of symptoms. Frequently waking up in early morning hours with "hot flashes" and burning in chest/stomach. Once associated with vomiting. Does good throughout the day. Taking omeprazole 20mg  BID and Carafate before meals and at bedtime without relief.   HIDA showed gallbladder function is slightly sluggish. Patient could not recall if she had reproduction of symptoms. Notes she is eating fatty meals and symptoms always during the night/early am hours. Symptoms started in 12/2019 and getting more intense. Work up also including EGD, CT, HIDA, labs.   I doubt her symptoms are gallbladder related. She is not responding to PPI BID. She does need to make some dietary changes that she may find beneficial.   1# please have patient avoid fatty and greasy foods. She reported eating chicken and dumplings, cheeseburger and banana splits. 2# do not eat within 3 hours of laying down 3# do not overeat 4# continue omeprazole 20mg  before breakfast and then 20mg  before bedtime 5# send for labs and 24 hour urine collection to further evaluate for non-GI causes of hot flushing. If negative, then next step is referral to endocrine.  Orders placed.

## 2020-08-03 NOTE — Telephone Encounter (Signed)
Spoke with pt regarding mychart message per Tana Coast, PA. Pt was advised to avoid fatty and greasy foods, do not eat within 3 hours of laying down, do not overeat, continue omeprazole 20mg  before breakfast and then 20mg  before bed time and complete labs and 24 hour urine collection to further evaluate for non-GI causes of hot flushing. Pt is aware if results are  negative, then next step is referral to endocrine.

## 2020-08-05 ENCOUNTER — Ambulatory Visit (INDEPENDENT_AMBULATORY_CARE_PROVIDER_SITE_OTHER): Payer: Medicare Other

## 2020-08-05 ENCOUNTER — Other Ambulatory Visit: Payer: Self-pay

## 2020-08-05 DIAGNOSIS — Z23 Encounter for immunization: Secondary | ICD-10-CM | POA: Diagnosis not present

## 2020-08-05 DIAGNOSIS — R634 Abnormal weight loss: Secondary | ICD-10-CM | POA: Diagnosis not present

## 2020-08-05 DIAGNOSIS — R232 Flushing: Secondary | ICD-10-CM | POA: Diagnosis not present

## 2020-08-07 ENCOUNTER — Ambulatory Visit (INDEPENDENT_AMBULATORY_CARE_PROVIDER_SITE_OTHER): Payer: Medicare Other | Admitting: Family Medicine

## 2020-08-07 ENCOUNTER — Other Ambulatory Visit: Payer: Self-pay

## 2020-08-07 ENCOUNTER — Encounter: Payer: Self-pay | Admitting: Family Medicine

## 2020-08-07 VITALS — BP 107/73 | HR 74 | Ht 63.0 in | Wt 169.0 lb

## 2020-08-07 DIAGNOSIS — Z0000 Encounter for general adult medical examination without abnormal findings: Secondary | ICD-10-CM | POA: Diagnosis not present

## 2020-08-07 NOTE — Patient Instructions (Signed)
[name] , Thank you for taking time to come for your Medicare Wellness Visit. I appreciate your ongoing commitment to your health goals. Please review the following plan we discussed and let me know if I can assist you in the future.   Please continue to practice social distancing to keep you, your family, and our community safe.  If you must go out, please wear a Mask and practice good handwashing.  Screening recommendations/referrals: Colonoscopy: Due in 2026 Mammogram: Due 09/2020 Bone Density: Up to date  Recommended yearly ophthalmology/optometry visit for glaucoma screening and checkup Recommended yearly dental visit for hygiene and checkup  Vaccinations: Influenza vaccine: up to date Pneumococcal vaccine: up to date Tdap vaccine: up to date Shingles vaccine: Completed Zoster   Advanced directives: Let us know if we can help you with this  Conditions/risks identified: Falls   Next appointment: 08/25/2020 as scheduled  Preventive Care 65 Years and Older, Female Preventive care refers to lifestyle choices and visits with your health care provider that can promote health and wellness. What does preventive care include?  A yearly physical exam. This is also called an annual well check.  Dental exams once or twice a year.  Routine eye exams. Ask your health care provider how often you should have your eyes checked.  Personal lifestyle choices, including:  Daily care of your teeth and gums.  Regular physical activity.  Eating a healthy diet.  Avoiding tobacco and drug use.  Limiting alcohol use.  Practicing safe sex.  Taking low-dose aspirin every day.  Taking vitamin and mineral supplements as recommended by your health care provider. What happens during an annual well check? The services and screenings done by your health care provider during your annual well check will depend on your age, overall health, lifestyle risk factors, and family history of  disease. Counseling  Your health care provider may ask you questions about your:  Alcohol use.  Tobacco use.  Drug use.  Emotional well-being.  Home and relationship well-being.  Sexual activity.  Eating habits.  History of falls.  Memory and ability to understand (cognition).  Work and work Astronomer.  Reproductive health. Screening  You may have the following tests or measurements:  Height, weight, and BMI.  Blood pressure.  Lipid and cholesterol levels. These may be checked every 5 years, or more frequently if you are over 38 years old.  Skin check.  Lung cancer screening. You may have this screening every year starting at age 93 if you have a 30-pack-year history of smoking and currently smoke or have quit within the past 15 years.  Fecal occult blood test (FOBT) of the stool. You may have this test every year starting at age 66.  Flexible sigmoidoscopy or colonoscopy. You may have a sigmoidoscopy every 5 years or a colonoscopy every 10 years starting at age 19.  Hepatitis C blood test.  Hepatitis B blood test.  Sexually transmitted disease (STD) testing.  Diabetes screening. This is done by checking your blood sugar (glucose) after you have not eaten for a while (fasting). You may have this done every 1-3 years.  Bone density scan. This is done to screen for osteoporosis. You may have this done starting at age 91.  Mammogram. This may be done every 1-2 years. Talk to your health care provider about how often you should have regular mammograms. Talk with your health care provider about your test results, treatment options, and if necessary, the need for more tests. Vaccines  Your health care provider may recommend certain vaccines, such as:  Influenza vaccine. This is recommended every year.  Tetanus, diphtheria, and acellular pertussis (Tdap, Td) vaccine. You may need a Td booster every 10 years.  Zoster vaccine. You may need this after age  74.  Pneumococcal 13-valent conjugate (PCV13) vaccine. One dose is recommended after age 19.  Pneumococcal polysaccharide (PPSV23) vaccine. One dose is recommended after age 2. Talk to your health care provider about which screenings and vaccines you need and how often you need them. This information is not intended to replace advice given to you by your health care provider. Make sure you discuss any questions you have with your health care provider. Document Released: 11/13/2015 Document Revised: 07/06/2016 Document Reviewed: 08/18/2015 Elsevier Interactive Patient Education  2017 [employer] [place] Prevention in the Home Falls can cause injuries. They can happen to people of all ages. There are many things you can do to make your home safe and to help prevent falls. What can I do on the outside of my home?  Regularly fix the edges of walkways and driveways and fix any cracks.  Remove anything that might make you trip as you walk through a door, such as a raised step or threshold.  Trim any bushes or trees on the path to your home.  Use bright outdoor lighting.  Clear any walking paths of anything that might make someone trip, such as rocks or tools.  Regularly check to see if handrails are loose or broken. Make sure that both sides of any steps have handrails.  Any raised decks and porches should have guardrails on the edges.  Have any leaves, snow, or ice cleared regularly.  Use sand or salt on walking paths during winter.  Clean up any spills in your garage right away. This includes oil or grease spills. What can I do in the bathroom?  Use night lights.  Install grab bars by the toilet and in the tub and shower. Do not use towel bars as grab bars.  Use non-skid mats or decals in the tub or shower.  If you need to sit down in the shower, use a plastic, non-slip stool.  Keep the floor dry. Clean up any water that spills on the floor as soon as it happens.  Remove  soap buildup in the tub or shower regularly.  Attach bath mats securely with double-sided non-slip rug tape.  Do not have throw rugs and other things on the floor that can make you trip. What can I do in the bedroom?  Use night lights.  Make sure that you have a light by your bed that is easy to reach.  Do not use any sheets or blankets that are too big for your bed. They should not hang down onto the floor.  Have a firm chair that has side arms. You can use this for support while you get dressed.  Do not have throw rugs and other things on the floor that can make you trip. What can I do in the kitchen?  Clean up any spills right away.  Avoid walking on wet floors.  Keep items that you use a lot in easy-to-reach places.  If you need to reach something above you, use a strong step stool that has a grab bar.  Keep electrical cords out of the way.  Do not use floor polish or wax that makes floors slippery. If you must use wax, use non-skid floor wax.  Do  not have throw rugs and other things on the floor that can make you trip. What can I do with my stairs?  Do not leave any items on the stairs.  Make sure that there are handrails on both sides of the stairs and use them. Fix handrails that are broken or loose. Make sure that handrails are as long as the stairways.  Check any carpeting to make sure that it is firmly attached to the stairs. Fix any carpet that is loose or worn.  Avoid having throw rugs at the top or bottom of the stairs. If you do have throw rugs, attach them to the floor with carpet tape.  Make sure that you have a light switch at the top of the stairs and the bottom of the stairs. If you do not have them, ask someone to add them for you. What else can I do to help prevent falls?  Wear shoes that:  Do not have high heels.  Have rubber bottoms.  Are comfortable and fit you well.  Are closed at the toe. Do not wear sandals.  If you use a  stepladder:  Make sure that it is fully opened. Do not climb a closed stepladder.  Make sure that both sides of the stepladder are locked into place.  Ask someone to hold it for you, if possible.  Clearly mark and make sure that you can see:  Any grab bars or handrails.  First and last steps.  Where the edge of each step is.  Use tools that help you move around (mobility aids) if they are needed. These include:  Canes.  Walkers.  Scooters.  Crutches.  Turn on the lights when you go into a dark area. Replace any light bulbs as soon as they burn out.  Set up your furniture so you have a clear path. Avoid moving your furniture around.  If any of your floors are uneven, fix them.  If there are any pets around you, be aware of where they are.  Review your medicines with your doctor. Some medicines can make you feel dizzy. This can increase your chance of falling. Ask your doctor what other things that you can do to help prevent falls. This information is not intended to replace advice given to you by your health care provider. Make sure you discuss any questions you have with your health care provider. Document Released: 08/13/2009 Document Revised: 03/24/2016 Document Reviewed: 11/21/2014 Elsevier Interactive Patient Education  2017 [employer].

## 2020-08-07 NOTE — Progress Notes (Signed)
Subjective:   Laura [name] is a 69 y.o. female who presents for Medicare Annual (Subsequent) preventive examination.  Method of visit: Telephone  Location of Patient: Home Location of Provider: Office Consent was obtain for visit over the telephone. Services rendered by provider: Visit was performed via telephone  I verified that I am speaking with the correct person using two identifiers.  Review of Systems    Cardiac Risk Factors include: advanced age (>34men, >46 women);hypertension;dyslipidemia     Objective:    Today's Vitals   08/07/20 1025 08/07/20 1026  BP: 107/73   Pulse: 74   Weight: 169 lb (76.7 kg)   Height: 5\' 3"  (1.6 m)   PainSc: 0-No pain 0-No pain   Body mass index is 29.94 kg/m.  Advanced Directives 04/21/2020 04/20/2020 07/30/2018 01/16/2018 08/18/2017 08/10/2017 08/03/2017  Does Patient Have a Medical Advance Directive? No No No No No No No  Would patient like information on creating a medical advance directive? - No - Patient declined Yes (ED - Information included in AVS) No - Patient declined No - Patient declined No - Patient declined No - Patient declined  Pre-existing out of facility DNR order (yellow form or pink MOST form) - - - - - - -    Current Medications (verified) Outpatient Encounter Medications as of 08/07/2020  Medication Sig  . acetaminophen (TYLENOL) 500 MG tablet Take 1 tablet (500 mg total) by mouth every 6 (six) hours as needed. (Patient taking differently: Take 500 mg by mouth every 6 (six) hours as needed for mild pain. )  . amLODipine (NORVASC) 2.5 MG tablet TAKE 1 TABLET(2.5 MG) BY MOUTH DAILY (Patient taking differently: Take 2.5 mg by mouth daily. )  . atorvastatin (LIPITOR) 80 MG tablet Take 1 tablet (80 mg total) by mouth daily. (Patient taking differently: Take 40 mg by mouth daily. )  . citalopram (CELEXA) 40 MG tablet Take 1 tablet (40 mg total) by mouth daily.  10/07/2020 gabapentin (NEURONTIN) 100 MG capsule Take 1 capsule (100 mg  total) by mouth at bedtime.  . hydrochlorothiazide (HYDRODIURIL) 25 MG tablet TAKE 1 TABLET BY MOUTH  DAILY  . levothyroxine (SYNTHROID) 100 MCG tablet Take 1 tablet (100 mcg total) by mouth daily.  Marland Kitchen linaclotide (LINZESS) 72 MCG capsule Take 1 capsule (72 mcg total) by mouth daily before breakfast. (Patient taking differently: Take 72 mcg by mouth daily as needed (constipation.). )  . loratadine (CLARITIN) 10 MG tablet Take 1 tablet (10 mg total) by mouth daily. (Patient taking differently: Take 10 mg by mouth daily as needed for allergies. )  . omeprazole (PRILOSEC) 20 MG capsule Take 1 capsule (20 mg total) by mouth 2 (two) times daily before a meal.  . Polyethyl Glycol-Propyl Glycol (SYSTANE) 0.4-0.3 % SOLN Apply 1 drop to eye 3 (three) times daily as needed (for dry eyes.).  Marland Kitchen potassium chloride SA (KLOR-CON) 20 MEQ tablet Take 1 tablet (20 mEq total) by mouth daily.  . Probiotic Product (PROBIOTIC ADVANCED PO) Take 1 capsule by mouth daily. [facility]  . sucralfate (CARAFATE) 1 GM/10ML suspension Take 10 mLs (1 g total) by mouth 3 (three) times daily as needed. For burning in the stomach or chest  . triamcinolone cream (KENALOG) 0.1 % Apply twice daily to rash  On right leg for 5 days , then as needed (Patient taking differently: Apply 1 application topically 2 (two) times daily as needed (rash.). )   Facility-Administered Encounter Medications as of 08/07/2020  Medication  . sucralfate (CARAFATE) 1 GM/10ML suspension 1 g    Allergies (verified) Cozaar [losartan potassium], Dilaudid [hydromorphone hcl], Levaquin [levofloxacin], Nsaids, and Promethazine   History: Past Medical History:  Diagnosis Date  . Anxiety   . At low risk for fall 08/15/2018  . Cystitis   . Depression   . Gastritis   . GERD (gastroesophageal reflux disease)   . Hashimoto's thyroiditis    Hx   . Hyperlipidemia   . Hypertension   . Hypothyroidism   . Normocytic anemia 2009 Hb 9.9-11.1  .  Osteopenia   . Shoulder pain   . Urinary incontinence    Past Surgical History:  Procedure Laterality Date  . Bilateral tubal ligation    . BIOPSY  04/21/2020   Procedure: BIOPSY;  Surgeon: Corbin Adeourk, Robert M, MD;  Location: [facility];  Service: Endoscopy;;  . BREAST EXCISIONAL BIOPSY  2010   Excisional biopsy of benign left breast mass -lopoma   . BREAST SURGERY     left nreast biopsy  . COLONOSCOPY  08/26/2011   SLF: 1. Internal hemorrhoids  . COLONOSCOPY N/A 10/06/2015   ZOX:WRUESLF:anal fissure or internal hemorrhoids/mild sigmoid colitis. benign colonic path   . colonscopy  2005   Dr. Katrinka BlazingSmith  . ESOPHAGOGASTRODUODENOSCOPY  12/2009   chronic gastritis  . ESOPHAGOGASTRODUODENOSCOPY N/A 05/17/2017   Dr. Darrick PennaFields; gastritis, normal small bowel biopsies, multiple gastric polyps. fundic gland polyps  . ESOPHAGOGASTRODUODENOSCOPY (EGD) WITH PROPOFOL N/A 04/21/2020   Rourk: multiple gastric polyps, biopsy of one c/w fundic gland. gastric biopsy showed mild chronic gastritis but no H.pylori  . GIVENS CAPSULE STUDY N/A 05/30/2017   gastritis, no source for anemia identified  . left knee surgery Left March 1,2017  . PARTIAL KNEE ARTHROPLASTY Left 08/09/2017   Procedure: LEFT UNICOMPARTMENTAL KNEE;  Surgeon: Ollen GrossAluisio, Frank, MD;  Location: [facility];  Service: Orthopedics;  Laterality: Left;  . POLYPECTOMY  04/21/2020   Procedure: POLYPECTOMY;  Surgeon: Corbin Adeourk, Robert M, MD;  Location: [facility];  Service: Endoscopy;;  gastric  . Resection of left lobe of thyroid    . right carpal tunnel release    . rt. knee athroscopy  2004  . SAVORY DILATION N/A 05/17/2017   Procedure: SAVORY DILATION;  Surgeon: West BaliFields, Sandi L, MD;  Location: [facility];  Service: Endoscopy;  Laterality: N/A;  . TOTAL ABDOMINAL HYSTERECTOMY  1994  . UMBILICAL HERNIA REPAIR    . Urethral dilation for stenosis  2009   Family History  Problem Relation Age of Onset  . Pneumonia Mother   . Healthy Father   . Colon cancer Neg Hx     . Anesthesia problems Neg Hx   . Hypotension Neg Hx   . Malignant hyperthermia Neg Hx   . Pseudochol deficiency Neg Hx    Social History   Socioeconomic History  . Marital status: Married    Spouse name: Not on file  . Number of children: 3  . Years of education: Not on file  . Highest education level: Not on file  Occupational History  . 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 Smoking status: Former Smoker    Packs/day: 0.25    Years: 1.00    Pack years: 0.25    Types: Cigarettes    Quit date: 08/25/1972    Years since quitting: 47.9  . Smokeless tobacco: Never Used  Vaping Use  . Vaping Use: Never used  Substance and Sexual Activity  .  Alcohol use: No  . Drug use: No  . Sexual activity: Yes    Birth control/protection: Surgical    Comment: married  Other Topics Concern  . Not on file  Social History Narrative  . Not on file   Social Determinants of Health   Financial Resource Strain: Low Risk   . Difficulty of Paying Living Expenses: Not hard at all  Food Insecurity: No Food Insecurity  . Worried About Programme researcher, broadcasting/film/video in the Last Year: Never true  . Ran Out of Food in the Last Year: Never true  Transportation Needs: No Transportation Needs  . Lack of Transportation (Medical): No  . Lack of Transportation (Non-Medical): No  Physical Activity: Insufficiently Active  . Days of Exercise per Week: 3 days  . Minutes of Exercise per Session: 20 min  Stress: Stress Concern Present  . Feeling of Stress : To some extent  Social Connections: Moderately Integrated  . Frequency of Communication with Friends and Family: Three times a week  . Frequency of Social Gatherings with Friends and Family: Twice a week  . Attends Religious Services: More than 4 times per year  . Active Member of Clubs or Organizations: No  . Attends Banker Meetings: Never  . Marital Status: Married    Tobacco Counseling Counseling given:  Yes   Clinical Intake:  Pre-visit preparation completed: Yes  Pain : No/denies pain Pain Score: 0-No pain     BMI - recorded: 29.94 Nutritional Status: BMI 25 -29 Overweight Nutritional Risks: None Diabetes: No  How often do you need to have someone help you when you read instructions, pamphlets, or other written materials from your doctor or pharmacy?: 1 - Never What is the last grade level you completed in school?: 14 years  Diabetic? no  Interpreter Needed?: No      Activities of Daily Living In your present state of health, do you have any difficulty performing the following activities: 08/07/2020 04/20/2020  Hearing? N N  Vision? N N  Difficulty concentrating or making decisions? N N  Walking or climbing stairs? N N  Dressing or bathing? N N  Doing errands, shopping? N N  Preparing Food and eating ? N -  Using the Toilet? N -  In the past six months, have you accidently leaked urine? N -  Do you have problems with loss of bowel control? N -  Managing your Medications? N -  Managing your Finances? N -  Housekeeping or managing your Housekeeping? N -  Some recent data might be hidden    Patient Care Team: Kerri Perches, MD as PCP - General Fields, Darleene Cleaver, MD (Inactive) (Gastroenterology) Laqueta Linden, MD (Inactive) as Attending Physician (Cardiology) Beverely Low, MD as Consulting Physician (Orthopedic Surgery) Roma Kayser, MD as Consulting Physician (Endocrinology) Ollen Gross, MD as Consulting Physician (Orthopedic Surgery)  Indicate any recent Medical Services you may have received from other than [facility] providers in the past year (date may be approximate).     Assessment:   This is a routine wellness examination for Laura [name].  Hearing/Vision screen No exam data present  Dietary issues and exercise activities discussed: Current Exercise Habits: Home exercise routine, Type of exercise: walking, Time (Minutes): 20, Frequency  (Times/Week): 3, Weekly Exercise (Minutes/Week): 60, Intensity: Mild, Exercise limited by: None identified  Goals    . Exercise 3x per week (30 min per time)     Recommend starting a routine exercise program at least 3  days a week for 30-45 minutes at a time as tolerated.        Depression Screen PHQ 2/9 Scores 08/07/2020 08/07/2020 06/30/2020 06/15/2020 06/15/2020 05/29/2020 03/05/2020  PHQ - 2 Score 0 0 0 6 0 0 0  PHQ- 9 Score - - - 23 - - -    Fall Risk Fall Risk  08/07/2020 06/30/2020 06/15/2020 05/29/2020 04/02/2020  Falls in the past year? 0 0 0 0 1  Number falls in past yr: - - 0 - 0  Injury with Fall? - - 0 - 0  Risk for fall due to : - - - - -  Follow up - - Falls evaluation completed - -    Any stairs in or around the home? Yes  If so, are there any without handrails? No  Home free of loose throw rugs in walkways, pet beds, electrical cords, etc? Yes  Adequate lighting in your home to reduce risk of falls? Yes   ASSISTIVE DEVICES UTILIZED TO PREVENT FALLS:  Life alert? No  Use of a cane, walker or w/c? No  Grab bars in the bathroom? No  Shower chair or bench in shower? No  Elevated toilet seat or a handicapped toilet? Yes   TIMED UP AND GO:  Was the test performed? No .   Cognitive Function:     6CIT Screen 08/07/2020 08/07/2019 07/30/2018 07/25/2017  What Year? 0 points 0 points 0 points 0 points  What month? 0 points 0 points 0 points 0 points  What time? 0 points 0 points 0 points 0 points  Count back from 20 0 points 0 points 0 points 0 points  Months in reverse 0 points 0 points 0 points 0 points  Repeat phrase 0 points 0 points 0 points 0 points  Total Score 0 0 0 0    Immunizations Immunization History  Administered Date(s) Administered  . Fluad Quad(high Dose 65+) 06/20/2019, 08/05/2020  . Influenza Split 07/17/2012  . Influenza Whole 10/08/2003, 07/01/2010, 07/14/2011  . Influenza,inj,Quad PF,6+ Mos 08/14/2013, 08/28/2014, 09/03/2015, 08/10/2016,  06/21/2017, 06/26/2018  . Influenza-Unspecified 09/14/2017  . Moderna SARS-COVID-2 Vaccination 12/15/2019, 01/12/2020  . Pneumococcal Conjugate-13 06/05/2014  . Pneumococcal Polysaccharide-23 11/20/2015  . Td 04/21/2004  . Tdap 06/05/2014  . Zoster 03/31/2011    TDAP status: Up to date Flu Vaccine status: Up to date Pneumococcal vaccine status: Up to date Covid-19 vaccine status: Completed vaccines  Qualifies for Shingles Vaccine? Yes   Zostavax completed Yes   Shingrix Completed?: No.    Education has been provided regarding the importance of this vaccine. Patient has been advised to call insurance company to determine out of pocket expense if they have not yet received this vaccine. Advised may also receive vaccine at local pharmacy or Health Dept. Verbalized acceptance and understanding.  Screening Tests Health Maintenance  Topic Date Due  . URINE MICROALBUMIN  09/15/2021 (Originally 07/11/1961)  . MAMMOGRAM  10/20/2021  . TETANUS/TDAP  06/05/2024  . COLONOSCOPY  10/05/2025  . INFLUENZA VACCINE  Completed  . DEXA SCAN  Completed  . COVID-19 Vaccine  Completed  . Hepatitis C Screening  Completed  . PNA vac Low Risk Adult  Completed    Health Maintenance  There are no preventive care reminders to display for this patient.  Colorectal cancer screening: Completed 10/06/2015. Repeat every 10 years Mammogram status: Completed 10/21/19. Repeat every year Bone Density status: Completed 08/08/2018. Results reflect: Bone density results: NORMAL. Repeat every 2 years.  Lung Cancer Screening: (Low  Dose CT Chest recommended if Age 75-80 years, 30 pack-year currently smoking OR have quit w/in 15years.) does not qualify.   Lung Cancer Screening Referral:   Additional Screening:  Hepatitis C Screening: does not qualify; Completed 04/12/2016   Vision Screening: Recommended annual ophthalmology exams for early detection of glaucoma and other disorders of the eye. Is the patient up  to date with their annual eye exam?  Yes  Who is the provider or what is the name of the office in which the patient attends annual eye exams? Dr.Cotter If pt is not established with a provider, would they like to be referred to a provider to establish care? No .   Dental Screening: Recommended annual dental exams for proper oral hygiene  Community Resource Referral / Chronic Care Management: CRR required this visit?  No   CCM required this visit?  No      Plan:    1. Encounter for Medicare annual wellness exam   I have personally reviewed and noted the following in the patient's chart:   . Medical and social history . Use of alcohol, tobacco or illicit drugs  . Current medications and supplements . Functional ability and status . Nutritional status . Physical activity . Advanced directives . List of other physicians . Hospitalizations, surgeries, and ER visits in previous 12 months . Vitals . Screenings to include cognitive, depression, and falls . Referrals and appointments  In addition, I have reviewed and discussed with patient certain preventive protocols, quality metrics, and best practice recommendations. A written personalized care plan for preventive services as well as general preventive health recommendations were provided to patient.     Freddy Finner, NP   08/07/2020   I provided 20 minutes of non-face-to-face time during this encounter.

## 2020-08-10 LAB — 5 HIAA, QUANTITATIVE, URINE, 24 HOUR
5 HIAA, 24 Hour Urine: 6.3 mg/24 h — ABNORMAL HIGH (ref <=6.0)
Total Volume: 1100 mL

## 2020-08-10 LAB — METANEPHRINES, URINE, 24 HOUR
Metaneph Total, Ur: 568 mcg/24 h (ref 224–832)
Metanephrines, Ur: 190 mcg/24 h (ref 90–315)
Normetanephrine, 24H Ur: 378 mcg/24 h (ref 122–676)
Volume, Urine-VMAUR: 1100 mL

## 2020-08-10 LAB — SEDIMENTATION RATE: Sed Rate: 22 mm/h (ref 0–30)

## 2020-08-10 LAB — TRYPTASE: Tryptase: 4.5 mcg/L (ref <11.0)

## 2020-08-10 LAB — C-REACTIVE PROTEIN: CRP: 1.7 mg/L (ref <8.0)

## 2020-08-11 LAB — CATECHOLAMINES, FRACT, RANDOM URINE
Calc Total (E+NE) Random: 25 mcg/g cr (ref 9–74)
Creatinine, Random U: 174 mg/dL (ref 20–275)
Dopamine, Rand Ur: 141 mcg/g cr (ref 40–390)
Norepinephrine, Random U: 25 mcg/g cr (ref 7–65)

## 2020-08-12 ENCOUNTER — Other Ambulatory Visit: Payer: Self-pay | Admitting: Family Medicine

## 2020-08-13 ENCOUNTER — Other Ambulatory Visit: Payer: Self-pay

## 2020-08-13 ENCOUNTER — Encounter: Payer: Self-pay | Admitting: Internal Medicine

## 2020-08-13 ENCOUNTER — Ambulatory Visit (INDEPENDENT_AMBULATORY_CARE_PROVIDER_SITE_OTHER): Payer: Medicare Other | Admitting: Internal Medicine

## 2020-08-13 VITALS — BP 139/89 | HR 76 | Temp 97.4°F | Resp 18 | Ht 63.0 in | Wt 177.0 lb

## 2020-08-13 DIAGNOSIS — I10 Essential (primary) hypertension: Secondary | ICD-10-CM

## 2020-08-13 DIAGNOSIS — F322 Major depressive disorder, single episode, severe without psychotic features: Secondary | ICD-10-CM

## 2020-08-13 DIAGNOSIS — M5416 Radiculopathy, lumbar region: Secondary | ICD-10-CM | POA: Diagnosis not present

## 2020-08-13 DIAGNOSIS — M545 Low back pain, unspecified: Secondary | ICD-10-CM

## 2020-08-13 MED ORDER — TRAMADOL HCL 50 MG PO TABS: 50.0000 mg | ORAL_TABLET | Freq: Three times a day (TID) | ORAL | 0 refills | Status: AC | PRN

## 2020-08-13 NOTE — Progress Notes (Signed)
Acute Office Visit  Subjective:    Patient ID: Laura [name], female    DOB: [birth year], 69 y.o.   MRN: [id number]  Chief Complaint  Patient presents with  . Back Pain    back pain has been going on for a few months sees murphy wainer she is supposed to get pain shot next week on the 22 and she just needs releif until then.    HPI Patient is in today for evaluation of low back pain.  Patient complains of acute on chronic low back pain, which is midline, sharp, currently nonradiating, worse with movement and mildly better with Tylenol.  She rates the pain as 7-8/10.  She has tried heating pads and wearing the back belt.  She denies any recent injury or lifting weight.  MRI of the lumbar spine in 02/2020 showed left foraminal disc protrusion at L4-5 with left L4 nerve root encroachment.  Patient was referred to spine surgeon at that time and received a local injection.  Patient is supposed to visit the spine surgeon in the next week.  Patient's BP is 139/89 in the office today.  Patient takes amlodipine 2.5 mg once daily and tolerates it well.  Patient denies any headache, dizziness, chest pain, dyspnea or palpitations.   Past Medical History:  Diagnosis Date  . Anxiety   . At low risk for fall 08/15/2018  . Cystitis   . Depression   . Gastritis   . GERD (gastroesophageal reflux disease)   . Hashimoto's thyroiditis    Hx   . Hyperlipidemia   . Hypertension   . Hypothyroidism   . Normocytic anemia 2009 Hb 9.9-11.1  . Osteopenia   . Shoulder pain   . Urinary incontinence     Past Surgical History:  Procedure Laterality Date  . Bilateral tubal ligation    . BIOPSY  04/21/2020   Procedure: BIOPSY;  Surgeon: Corbin Ade, MD;  Location: [facility];  Service: Endoscopy;;  . BREAST EXCISIONAL BIOPSY  2010   Excisional biopsy of benign left breast mass -lopoma   . BREAST SURGERY     left nreast biopsy  . COLONOSCOPY  08/26/2011   SLF: 1. Internal hemorrhoids  .  COLONOSCOPY N/A 10/06/2015   JKK:XFGH fissure or internal hemorrhoids/mild sigmoid colitis. benign colonic path   . colonscopy  2005   Dr. Katrinka Blazing  . ESOPHAGOGASTRODUODENOSCOPY  12/2009   chronic gastritis  . ESOPHAGOGASTRODUODENOSCOPY N/A 05/17/2017   Dr. Darrick Penna; gastritis, normal small bowel biopsies, multiple gastric polyps. fundic gland polyps  . ESOPHAGOGASTRODUODENOSCOPY (EGD) WITH PROPOFOL N/A 04/21/2020   Rourk: multiple gastric polyps, biopsy of one c/w fundic gland. gastric biopsy showed mild chronic gastritis but no H.pylori  . GIVENS CAPSULE STUDY N/A 05/30/2017   gastritis, no source for anemia identified  . left knee surgery Left March 1,2017  . PARTIAL KNEE ARTHROPLASTY Left 08/09/2017   Procedure: LEFT UNICOMPARTMENTAL KNEE;  Surgeon: Ollen Gross, MD;  Location: [facility];  Service: Orthopedics;  Laterality: Left;  . POLYPECTOMY  04/21/2020   Procedure: POLYPECTOMY;  Surgeon: Corbin Ade, MD;  Location: [facility];  Service: Endoscopy;;  gastric  . Resection of left lobe of thyroid    . right carpal tunnel release    . rt. knee athroscopy  2004  . SAVORY DILATION N/A 05/17/2017   Procedure: SAVORY DILATION;  Surgeon: West Bali, MD;  Location: [facility];  Service: Endoscopy;  Laterality: N/A;  . TOTAL ABDOMINAL HYSTERECTOMY  1994  . UMBILICAL HERNIA REPAIR    . Urethral dilation for stenosis  2009    Family History  Problem Relation Age of Onset  . Pneumonia Mother   . Healthy Father   . Colon cancer Neg Hx   . Anesthesia problems Neg Hx   . Hypotension Neg Hx   . Malignant hyperthermia Neg Hx   . Pseudochol deficiency Neg Hx     Social History   Socioeconomic History  . Marital status: Married    Spouse name: Not on file  . Number of children: 3  . Years of education: Not on file  . Highest education level: Not on file  Occupational History  . 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 Smoking status: Former Smoker     Packs/day: 0.25    Years: 1.00    Pack years: 0.25    Types: Cigarettes    Quit date: 08/25/1972    Years since quitting: 48.0  . Smokeless tobacco: Never Used  Vaping Use  . Vaping Use: Never used  Substance and Sexual Activity  . Alcohol use: No  . Drug use: No  . Sexual activity: Yes    Birth control/protection: Surgical    Comment: married  Other Topics Concern  . Not on file  Social History Narrative  . Not on file   Social Determinants of Health   Financial Resource Strain: Low Risk   . Difficulty of Paying Living Expenses: Not hard at all  Food Insecurity: No Food Insecurity  . Worried About Programme researcher, broadcasting/film/video in the Last Year: Never true  . Ran Out of Food in the Last Year: Never true  Transportation Needs: No Transportation Needs  . Lack of Transportation (Medical): No  . Lack of Transportation (Non-Medical): No  Physical Activity: Insufficiently Active  . Days of Exercise per Week: 3 days  . Minutes of Exercise per Session: 20 min  Stress: Stress Concern Present  . Feeling of Stress : To some extent  Social Connections: Moderately Integrated  . Frequency of Communication with Friends and Family: Three times a week  . Frequency of Social Gatherings with Friends and Family: Twice a week  . Attends Religious Services: More than 4 times per year  . Active Member of Clubs or Organizations: No  . Attends Banker Meetings: Never  . Marital Status: Married  Catering manager Violence: Not At Risk  . Fear of Current or Ex-Partner: No  . Emotionally Abused: No  . Physically Abused: No  . Sexually Abused: No    Outpatient Medications Prior to Visit  Medication Sig Dispense Refill  . acetaminophen (TYLENOL) 500 MG tablet Take 1 tablet (500 mg total) by mouth every 6 (six) hours as needed. (Patient taking differently: Take 500 mg by mouth every 6 (six) hours as needed for mild pain. ) 30 tablet 0  . amLODipine (NORVASC) 2.5 MG tablet TAKE 1 TABLET(2.5  MG) BY MOUTH DAILY (Patient taking differently: Take 2.5 mg by mouth daily. ) 90 tablet 1  . atorvastatin (LIPITOR) 80 MG tablet Take 1 tablet (80 mg total) by mouth daily. (Patient taking differently: Take 40 mg by mouth daily. ) 90 tablet 3  . citalopram (CELEXA) 40 MG tablet Take 1 tablet (40 mg total) by mouth daily. 30 tablet 3  . gabapentin (NEURONTIN) 100 MG capsule TAKE 1 CAPSULE BY MOUTH AT  BEDTIME 90 capsule 3  . hydrochlorothiazide (HYDRODIURIL) 25 MG tablet TAKE  1 TABLET BY MOUTH  DAILY 90 tablet 3  . levothyroxine (SYNTHROID) 100 MCG tablet Take 1 tablet (100 mcg total) by mouth daily. 90 tablet 3  . linaclotide (LINZESS) 72 MCG capsule Take 1 capsule (72 mcg total) by mouth daily before breakfast. (Patient taking differently: Take 72 mcg by mouth daily as needed (constipation.). ) 90 capsule 3  . loratadine (CLARITIN) 10 MG tablet Take 1 tablet (10 mg total) by mouth daily. (Patient taking differently: Take 10 mg by mouth daily as needed for allergies. ) 30 tablet 3  . omeprazole (PRILOSEC) 20 MG capsule Take 1 capsule (20 mg total) by mouth 2 (two) times daily before a meal. 60 capsule 3  . Polyethyl Glycol-Propyl Glycol (SYSTANE) 0.4-0.3 % SOLN Apply 1 drop to eye 3 (three) times daily as needed (for dry eyes.).    Marland Kitchen potassium chloride SA (KLOR-CON) 20 MEQ tablet Take 1 tablet (20 mEq total) by mouth daily. 30 tablet 3  . Probiotic Product (PROBIOTIC ADVANCED PO) Take 1 capsule by mouth daily. [facility]    . triamcinolone cream (KENALOG) 0.1 % Apply twice daily to rash  On right leg for 5 days , then as needed (Patient taking differently: Apply 1 application topically 2 (two) times daily as needed (rash.). ) 45 g 0  . sucralfate (CARAFATE) 1 GM/10ML suspension Take 10 mLs (1 g total) by mouth 3 (three) times daily as needed. For burning in the stomach or chest 420 mL 0  . sucralfate (CARAFATE) 1 GM/10ML suspension 1 g      No facility-administered medications prior to  visit.    Allergies  Allergen Reactions  . Cozaar [Losartan Potassium] Cough  . Dilaudid [Hydromorphone Hcl] Other (See Comments)    Confusion;altered state of mind  . Levaquin [Levofloxacin] Other (See Comments)    Dry throat, no raste  . Nsaids Other (See Comments)    REACTION: per GI pt no longer to use NSAIDS REACTION: per GI pt no longer to use NSAIDS  . Promethazine     Legs itching, RESTLESS LEGS    Review of Systems  Constitutional: Negative for chills and fever.  HENT: Negative for congestion, postnasal drip, rhinorrhea, sinus pressure, sinus pain and sore throat.   Eyes: Negative for pain and discharge.  Respiratory: Negative for cough and shortness of breath.   Cardiovascular: Negative for chest pain and palpitations.  Gastrointestinal: Negative for abdominal pain, constipation, diarrhea, nausea and vomiting.  Endocrine: Negative for polydipsia and polyuria.  Genitourinary: Negative for dysuria and hematuria.  Musculoskeletal: Positive for back pain (Chronic, low back pain). Negative for neck pain and neck stiffness.  Skin: Negative for rash.  Neurological: Negative for dizziness, seizures, syncope, weakness and numbness.  Psychiatric/Behavioral: Negative for agitation and behavioral problems.       Objective:    Physical Exam Vitals reviewed.  Constitutional:      General: She is not in acute distress.    Appearance: She is not diaphoretic.  HENT:     Head: Normocephalic and atraumatic.     Nose: Nose normal.     Mouth/Throat:     Mouth: Mucous membranes are moist.  Eyes:     General: No scleral icterus.    Extraocular Movements: Extraocular movements intact.     Pupils: Pupils are equal, round, and reactive to light.  Cardiovascular:     Rate and Rhythm: Normal rate and regular rhythm.     Pulses: Normal pulses.     Heart  sounds: Normal heart sounds. No murmur heard.   Pulmonary:     Breath sounds: Normal breath sounds. No wheezing or rales.    Abdominal:     Palpations: Abdomen is soft.     Tenderness: There is no abdominal tenderness.  Musculoskeletal:     Cervical back: Neck supple. No tenderness.     Right lower leg: No edema.     Left lower leg: No edema.     Comments: Tenderness in the midline, around L3-L5 No swelling, warmth or erythema  Skin:    General: Skin is warm.     Findings: No rash.  Neurological:     General: No focal deficit present.     Mental Status: She is alert and oriented to person, place, and time.     Sensory: No sensory deficit.     Motor: No weakness.  Psychiatric:        Mood and Affect: Mood normal.        Behavior: Behavior normal.     BP 139/89 (BP Location: Right Arm, Patient Position: Sitting, Cuff Size: Normal)   Pulse 76   Temp (!) 97.4 F (36.3 C) (Temporal)   Resp 18   Ht 5\' 3"  (1.6 m)   Wt 177 lb (80.3 kg)   SpO2 99%   BMI 31.35 kg/m  Wt Readings from Last 3 Encounters:  08/13/20 177 lb (80.3 kg)  08/07/20 169 lb (76.7 kg)  06/30/20 169 lb (76.7 kg)    There are no preventive care reminders to display for this patient.  There are no preventive care reminders to display for this patient.   Lab Results  Component Value Date   TSH 0.421 (L) 05/29/2020   Lab Results  Component Value Date   WBC 4.7 04/14/2020   HGB 11.5 (L) 04/14/2020   HCT 35.6 04/14/2020   MCV 86.2 04/14/2020   PLT 293 04/14/2020   Lab Results  Component Value Date   NA 138 05/29/2020   K 3.1 (L) 05/29/2020   CO2 26 05/29/2020   GLUCOSE 91 05/29/2020   BUN 13 05/29/2020   CREATININE 1.02 (H) 05/29/2020   BILITOT 0.6 05/29/2020   ALKPHOS 90 05/29/2020   AST 24 05/29/2020   ALT 34 (H) 05/29/2020   PROT 7.4 05/29/2020   ALBUMIN 4.6 05/29/2020   CALCIUM 10.0 05/29/2020   ANIONGAP 12 03/03/2020   Lab Results  Component Value Date   CHOL 224 (H) 02/10/2020   Lab Results  Component Value Date   HDL 60 02/10/2020   Lab Results  Component Value Date   LDLCALC 143 (H) 02/10/2020    Lab Results  Component Value Date   TRIG 99 02/10/2020   Lab Results  Component Value Date   CHOLHDL 3.7 02/10/2020   Lab Results  Component Value Date   HGBA1C 5.2 03/11/2020       Assessment & Plan:   Lumbar radiculopathy MRI lumbar spine (02/2020) reviewed Follows up with Spine surgery Tramadol for short-term, okay to take Tylenol in between Advised to avoid lifting weight and excessive movement Continue to apply heating pads and back belt Continue to follow up with Spine Surgeon  HTN Well-controlled with Amlodipine and HCTZ  Depression Well-controlled with Citalopram  Meds ordered this encounter  Medications  . traMADol (ULTRAM) 50 MG tablet    Sig: Take 1 tablet (50 mg total) by mouth every 8 (eight) hours as needed for up to 7 days for moderate pain or severe pain.  Dispense:  20 tablet    Refill:  0     Bellamy Judson Concha Se, MD

## 2020-08-13 NOTE — Patient Instructions (Addendum)
Radicular Pain Radicular pain is a type of pain that spreads from your back or neck along a spinal nerve. Spinal nerves are nerves that leave the spinal cord and go to the muscles. Radicular pain is sometimes called radiculopathy, radiculitis, or a pinched nerve. When you have this type of pain, you may also have weakness, numbness, or tingling in the area of your body that is supplied by the nerve. The pain may feel sharp and burning. Depending on which spinal nerve is affected, the pain may occur in the:  Lower back area (lumbar radicular pain). You would feel this pain as low back pain. You may feel pain, numbness, weakness, or tingling in the buttocks or legs. Sciatica is a type of lumbar radicular pain that shoots down the back of the leg. Follow these instructions at home: Managing pain      If directed, put ice on the affected area: ? Put ice in a plastic bag. ? Place a towel between your skin and the bag. ? Leave the ice on for 20 minutes, 2-3 times a day.  If directed, apply heat to the affected area as often as told by your health care provider. Use the heat source that your health care provider recommends, such as a moist heat pack or a heating pad. ? Place a towel between your skin and the heat source. ? Leave the heat on for 20-30 minutes. ? Remove the heat if your skin turns bright red. This is especially important if you are unable to feel pain, heat, or cold. You may have a greater risk of getting burned. Activity   Do not sit or rest in bed for long periods of time.  Try to stay as active as possible. Ask your health care provider what type of exercise or activity is best for you.  Avoid activities that make your pain worse, such as bending and lifting.  Do not lift anything that is heavier than 10 lb (4.5 kg), or the limit that you are told, until your health care provider says that it is safe.  Practice using proper technique when lifting items. Proper lifting  technique involves bending your knees and rising up.  Do strength and range-of-motion exercises only as told by your health care provider or physical therapist. General instructions  Take over-the-counter and prescription medicines only as told by your health care provider.  Pay attention to any changes in your symptoms.  Keep all follow-up visits as told by your health care provider. This is important. ? Your health care provider may send you to a physical therapist to help with this pain. Contact a health care provider if:  Your pain and other symptoms get worse.  Your pain medicine is not helping.  Your pain has not improved after a few weeks of home care.  You have a fever. Get help right away if:  You have severe pain, weakness, or numbness.  You have difficulty with bladder or bowel control. Summary  Radicular pain is a type of pain that spreads from your back or neck along a spinal nerve.  When you have radicular pain, you may also have weakness, numbness, or tingling in the area of your body that is supplied by the nerve.  The pain may feel sharp or burning.  Radicular pain may be treated with ice, heat, medicines, or physical therapy. This information is not intended to replace advice given to you by your health care provider. Make sure you discuss any questions  you have with your health care provider. Document Revised: 05/01/2018 Document Reviewed: 05/01/2018 Elsevier Patient Education  [place].

## 2020-08-19 ENCOUNTER — Other Ambulatory Visit: Payer: Self-pay

## 2020-08-19 DIAGNOSIS — R232 Flushing: Secondary | ICD-10-CM

## 2020-08-19 DIAGNOSIS — R634 Abnormal weight loss: Secondary | ICD-10-CM

## 2020-08-24 DIAGNOSIS — M47816 Spondylosis without myelopathy or radiculopathy, lumbar region: Secondary | ICD-10-CM | POA: Diagnosis not present

## 2020-08-25 ENCOUNTER — Ambulatory Visit (INDEPENDENT_AMBULATORY_CARE_PROVIDER_SITE_OTHER): Payer: Medicare Other | Admitting: Family Medicine

## 2020-08-25 ENCOUNTER — Other Ambulatory Visit: Payer: Self-pay

## 2020-08-25 ENCOUNTER — Encounter: Payer: Self-pay | Admitting: Family Medicine

## 2020-08-25 VITALS — BP 131/78 | HR 75 | Resp 15 | Ht 63.0 in | Wt 171.0 lb

## 2020-08-25 DIAGNOSIS — I10 Essential (primary) hypertension: Secondary | ICD-10-CM | POA: Diagnosis not present

## 2020-08-25 DIAGNOSIS — F322 Major depressive disorder, single episode, severe without psychotic features: Secondary | ICD-10-CM

## 2020-08-25 DIAGNOSIS — R232 Flushing: Secondary | ICD-10-CM | POA: Diagnosis not present

## 2020-08-25 DIAGNOSIS — E890 Postprocedural hypothyroidism: Secondary | ICD-10-CM

## 2020-08-25 DIAGNOSIS — F411 Generalized anxiety disorder: Secondary | ICD-10-CM | POA: Diagnosis not present

## 2020-08-25 DIAGNOSIS — E785 Hyperlipidemia, unspecified: Secondary | ICD-10-CM | POA: Diagnosis not present

## 2020-08-25 MED ORDER — MIRTAZAPINE 15 MG PO TABS: 15.0000 mg | ORAL_TABLET | Freq: Every day | ORAL | 3 refills | Status: DC

## 2020-08-25 NOTE — Patient Instructions (Addendum)
Keep December appointment, call if you need me before  Labs today, lipid, cmp and EGFr, TSH   You are referred to gynecology re hot flashes  Increase Remeron dose and take every night for sleep and appetite  Continue to  Follow with GI re stomach pain  Daily exercise for 30 mins Cold drinks  Bedtime shower, cool, fan.   Scant/ no clothing   When asleep  Thanks for choosing [facility], we consider it a privelige to serve you.

## 2020-08-26 ENCOUNTER — Encounter: Payer: Self-pay | Admitting: Family Medicine

## 2020-08-26 LAB — CMP14+EGFR
ALT: 21 IU/L (ref 0–32)
AST: 22 IU/L (ref 0–40)
Albumin/Globulin Ratio: 1.4 (ref 1.2–2.2)
Albumin: 4.6 g/dL (ref 3.8–4.8)
Alkaline Phosphatase: 81 IU/L (ref 44–121)
BUN/Creatinine Ratio: 14 (ref 12–28)
BUN: 13 mg/dL (ref 8–27)
Bilirubin Total: 0.4 mg/dL (ref 0.0–1.2)
CO2: 24 mmol/L (ref 20–29)
Calcium: 10.5 mg/dL — ABNORMAL HIGH (ref 8.7–10.3)
Chloride: 98 mmol/L (ref 96–106)
Creatinine, Ser: 0.95 mg/dL (ref 0.57–1.00)
GFR calc Af Amer: 70 mL/min/{1.73_m2} (ref >59)
GFR calc non Af Amer: 61 mL/min/{1.73_m2} (ref >59)
Globulin, Total: 3.3 g/dL (ref 1.5–4.5)
Glucose: 104 mg/dL — ABNORMAL HIGH (ref 65–99)
Potassium: 3.3 mmol/L — ABNORMAL LOW (ref 3.5–5.2)
Sodium: 138 mmol/L (ref 134–144)
Total Protein: 7.9 g/dL (ref 6.0–8.5)

## 2020-08-26 LAB — LIPID PANEL
Chol/HDL Ratio: 3.5 ratio (ref 0.0–4.4)
Cholesterol, Total: 215 mg/dL — ABNORMAL HIGH (ref 100–199)
HDL: 62 mg/dL (ref >39)
LDL Chol Calc (NIH): 132 mg/dL — ABNORMAL HIGH (ref 0–99)
Triglycerides: 118 mg/dL (ref 0–149)
VLDL Cholesterol Cal: 21 mg/dL (ref 5–40)

## 2020-08-26 LAB — TSH: TSH: 0.486 u[IU]/mL (ref 0.450–4.500)

## 2020-08-26 MED ORDER — POTASSIUM CHLORIDE CRYS ER 20 MEQ PO TBCR
20.0000 meq | EXTENDED_RELEASE_TABLET | Freq: Two times a day (BID) | ORAL | 4 refills | Status: DC
Start: 2020-08-26 — End: 2021-01-07

## 2020-08-26 NOTE — Assessment & Plan Note (Signed)
constipation DASH diet and commitment to daily physical activity for a minimum of 30 minutes discussed and encouraged, as a part of hypertension management. The importance of attaining a healthy weight is also discussed.  BP/Weight 08/25/2020 08/13/2020 08/07/2020 06/30/2020 06/29/2020 05/29/2020 04/21/2020  Systolic BP 131 139 107 107 147 148 130  Diastolic BP 78 89 73 73 85 68 70  Wt. (Lbs) 171 177 169 169 169 169.08 175  BMI 30.29 31.35 29.94 29.94 29.01 29.02 30.04

## 2020-08-26 NOTE — Assessment & Plan Note (Signed)
Improved, poor sleep and some improvement in appetite, increase Remeron to 15 mg at bedtime

## 2020-08-26 NOTE — Assessment & Plan Note (Signed)
Improved on medication will increase remeron dose for sleep and appetite

## 2020-08-26 NOTE — Assessment & Plan Note (Signed)
Hyperlipidemia:Low fat diet discussed and encouraged.   Lipid Panel  Lab Results  Component Value Date   CHOL 215 (H) 08/25/2020   HDL 62 08/25/2020   LDLCALC 132 (H) 08/25/2020   TRIG 118 08/25/2020   CHOLHDL 3.5 08/25/2020     Needs to lower fat intake , not at goal

## 2020-08-26 NOTE — Assessment & Plan Note (Signed)
Controlled, no change in medication  Managed by Endo 

## 2020-08-26 NOTE — Progress Notes (Signed)
   Laura [name]     MRN: [id number]      DOB: 31-Mar-1951   HPI Laura [name] is here for follow up and re-evaluation of chronic medical conditions, medication management and review of any available recent lab and radiology data.  Preventive health is updated, specifically  Cancer screening and Immunization.   C/o hot flashes awaken her in the morning, hormones off, wants Gyne consult Improved sleep and appetite with remeron though seems to " wear off" in lat morning when she awakens with the hot flashes Chronic epigastric pain being managed by GI  ROS Denies recent fever or chills. Denies sinus pressure, nasal congestion, ear pain or sore throat. Denies chest congestion, productive cough or wheezing. Denies chest pains, palpitations and leg swelling .   Denies dysuria, frequency, hesitancy or incontinence. Denies joint pain, swelling and limitation in mobility. Denies headaches, seizures, numbnesDenies  Denies skin break down or rash.   PE  BP 131/78   Pulse 75   Resp 15   Ht 5\' 3"  (1.6 m)   Wt 171 lb (77.6 kg)   SpO2 94%   BMI 30.29 kg/m   Patient alert and oriented and in no cardiopulmonary distress.  HEENT: No facial asymmetry, EOMI,     Neck supple .  Chest: Clear to auscultation bilaterally.  CVS: S1, S2 no murmurs, no S3.Regular rate.  ABD: Soft superficial epigastric tenderness, no guarding or rebound.   Ext: No edema  MS: Adequate ROM spine, shoulders, hips and knees.  Skin: Intact, no ulcerations or rash noted.  Psych: Good eye contact, normal affect. Memory intact mildly  anxious not  depressed appearing.  CNS: CN 2-12 intact, power,  normal throughout.no focal deficits noted.   Assessment & Plan  Essential hypertension constipation DASH diet and commitment to daily physical activity for a minimum of 30 minutes discussed and encouraged, as a part of hypertension management. The importance of attaining a healthy weight is also discussed.  BP/Weight  08/25/2020 08/13/2020 08/07/2020 06/30/2020 06/29/2020 05/29/2020 04/21/2020  Systolic BP 131 139 107 107 147 148 130  Diastolic BP 78 89 73 73 85 68 70  Wt. (Lbs) 171 177 169 169 169 169.08 175  BMI 30.29 31.35 29.94 29.94 29.01 29.02 30.04       GAD (generalized anxiety disorder) Improved, poor sleep and some improvement in appetite, increase Remeron to 15 mg at bedtime  Depression, major, single episode, severe (HCC) Improved on medication will increase remeron dose for sleep and appetite  Hyperlipidemia LDL goal <100 Hyperlipidemia:Low fat diet discussed and encouraged.   Lipid Panel  Lab Results  Component Value Date   CHOL 215 (H) 08/25/2020   HDL 62 08/25/2020   LDLCALC 132 (H) 08/25/2020   TRIG 118 08/25/2020   CHOLHDL 3.5 08/25/2020     Needs to lower fat intake , not at goal  POSTSURGICAL HYPOTHYROIDISM Controlled, no change in medication Managed by Endo

## 2020-09-01 DIAGNOSIS — R634 Abnormal weight loss: Secondary | ICD-10-CM | POA: Diagnosis not present

## 2020-09-01 DIAGNOSIS — R232 Flushing: Secondary | ICD-10-CM | POA: Diagnosis not present

## 2020-09-04 ENCOUNTER — Encounter: Payer: Self-pay | Admitting: Obstetrics & Gynecology

## 2020-09-04 ENCOUNTER — Ambulatory Visit (INDEPENDENT_AMBULATORY_CARE_PROVIDER_SITE_OTHER): Payer: Medicare Other | Admitting: Obstetrics & Gynecology

## 2020-09-04 VITALS — BP 136/85 | HR 66 | Ht 63.0 in | Wt 176.0 lb

## 2020-09-04 DIAGNOSIS — N951 Menopausal and female climacteric states: Secondary | ICD-10-CM | POA: Diagnosis not present

## 2020-09-04 LAB — 5 HIAA, QUANTITATIVE, URINE, 24 HOUR
5 HIAA, 24 Hour Urine: 13.6 mg/24 h — ABNORMAL HIGH (ref <=6.0)
Total Volume: 2900 mL

## 2020-09-04 NOTE — Progress Notes (Signed)
Chief Complaint  Patient presents with  . Hot Flashes      69 y.o. [id number] No LMP recorded. Patient has had a hysterectomy. The current method of family planning is status post hysterectomy.  Outpatient Encounter Medications as of 09/04/2020  Medication Sig  . acetaminophen (TYLENOL) 500 MG tablet Take 1 tablet (500 mg total) by mouth every 6 (six) hours as needed. (Patient taking differently: Take 500 mg by mouth every 6 (six) hours as needed for mild pain. )  . amLODipine (NORVASC) 2.5 MG tablet TAKE 1 TABLET(2.5 MG) BY MOUTH DAILY (Patient taking differently: Take 2.5 mg by mouth daily. )  . atorvastatin (LIPITOR) 80 MG tablet Take 1 tablet (80 mg total) by mouth daily. (Patient taking differently: Take 40 mg by mouth daily. )  . citalopram (CELEXA) 40 MG tablet Take 1 tablet (40 mg total) by mouth daily.  Marland Kitchen gabapentin (NEURONTIN) 100 MG capsule TAKE 1 CAPSULE BY MOUTH AT  BEDTIME  . hydrochlorothiazide (HYDRODIURIL) 25 MG tablet TAKE 1 TABLET BY MOUTH  DAILY  . levothyroxine (SYNTHROID) 100 MCG tablet Take 1 tablet (100 mcg total) by mouth daily.  Marland Kitchen linaclotide (LINZESS) 72 MCG capsule Take 1 capsule (72 mcg total) by mouth daily before breakfast. (Patient taking differently: Take 72 mcg by mouth daily as needed (constipation.). )  . loratadine (CLARITIN) 10 MG tablet Take 1 tablet (10 mg total) by mouth daily. (Patient taking differently: Take 10 mg by mouth daily as needed for allergies. )  . mirtazapine (REMERON) 15 MG tablet Take 1 tablet (15 mg total) by mouth at bedtime.  Marland Kitchen omeprazole (PRILOSEC) 20 MG capsule Take 1 capsule (20 mg total) by mouth 2 (two) times daily before a meal.  . Polyethyl Glycol-Propyl Glycol (SYSTANE) 0.4-0.3 % SOLN Apply 1 drop to eye 3 (three) times daily as needed (for dry eyes.).  Marland Kitchen potassium chloride SA (KLOR-CON) 20 MEQ tablet Take 1 tablet (20 mEq total) by mouth 2 (two) times daily. Dose increase effective 08/26/2020  . Probiotic Product  (PROBIOTIC ADVANCED PO) Take 1 capsule by mouth daily. [facility]  . [DISCONTINUED] triamcinolone cream (KENALOG) 0.1 % Apply twice daily to rash  On right leg for 5 days , then as needed (Patient taking differently: Apply 1 application topically 2 (two) times daily as needed (rash.). )   No facility-administered encounter medications on file as of 09/04/2020.    Subjective New onset vasomotor symptoms Was started on remeron with resolution No further therapy is recommended by me Past Medical History:  Diagnosis Date  . Anxiety   . At low risk for fall 08/15/2018  . Cystitis   . Depression   . Gastritis   . GERD (gastroesophageal reflux disease)   . Hashimoto's thyroiditis    Hx   . Hyperlipidemia   . Hypertension   . Hypothyroidism   . Normocytic anemia 2009 Hb 9.9-11.1  . Osteopenia   . Shoulder pain   . Urinary incontinence     Past Surgical History:  Procedure Laterality Date  . Bilateral tubal ligation    . BIOPSY  04/21/2020   Procedure: BIOPSY;  Surgeon: Corbin Ade, MD;  [facility];  Service: Endoscopy;;  . BREAST EXCISIONAL BIOPSY  2010   Excisional biopsy of benign left breast mass -lopoma   . BREAST SURGERY     left nreast biopsy  . COLONOSCOPY  08/26/2011   SLF: 1. Internal hemorrhoids  . COLONOSCOPY N/A 10/06/2015  EQA:STMH fissure or internal hemorrhoids/mild sigmoid colitis. benign colonic path   . colonscopy  2005   Dr. Katrinka Blazing  . ESOPHAGOGASTRODUODENOSCOPY  12/2009   chronic gastritis  . ESOPHAGOGASTRODUODENOSCOPY N/A 05/17/2017   Dr. Darrick Penna; gastritis, normal small bowel biopsies, multiple gastric polyps. fundic gland polyps  . ESOPHAGOGASTRODUODENOSCOPY (EGD) WITH PROPOFOL N/A 04/21/2020   Rourk: multiple gastric polyps, biopsy of one c/w fundic gland. gastric biopsy showed mild chronic gastritis but no H.pylori  . GIVENS CAPSULE STUDY N/A 05/30/2017   gastritis, no source for anemia identified  . left knee surgery Left March  1,2017  . PARTIAL KNEE ARTHROPLASTY Left 08/09/2017   Procedure: LEFT UNICOMPARTMENTAL KNEE;  Surgeon: Ollen Gross, MD;  Location: [facility];  Service: Orthopedics;  Laterality: Left;  . POLYPECTOMY  04/21/2020   Procedure: POLYPECTOMY;  Surgeon: Corbin Ade, MD;  [facility];  Service: Endoscopy;;  gastric  . Resection of left lobe of thyroid    . right carpal tunnel release    . rt. knee athroscopy  2004  . SAVORY DILATION N/A 05/17/2017   Procedure: SAVORY DILATION;  Surgeon: West Bali, MD;  [facility]: [facility];  Service: Endoscopy;  Laterality: N/A;  . TOTAL ABDOMINAL HYSTERECTOMY  1994  . UMBILICAL HERNIA REPAIR    . Urethral dilation for stenosis  2009    OB History    Gravida  3   Para  3   Term  3   Preterm      AB      Living  3     SAB      TAB      Ectopic      Multiple      Live Births  3           Allergies  Allergen Reactions  . Cozaar [Losartan Potassium] Cough  . Dilaudid [Hydromorphone Hcl] Other (See Comments)    Confusion;altered state of mind  . Levaquin [Levofloxacin] Other (See Comments)    Dry throat, no raste  . Nsaids Other (See Comments)    REACTION: per GI pt no longer to use NSAIDS REACTION: per GI pt no longer to use NSAIDS  . Promethazine     Legs itching, RESTLESS LEGS    Social History   Socioeconomic History  . Marital status: Married    Spouse name: Not on file  . Number of children: 3  . Years of education: Not on file  . Highest education level: Not on file  Occupational History  . 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 Smoking status: Former Smoker    Packs/day: 0.25    Years: 1.00    Pack years: 0.25    Types: Cigarettes    Quit date: 08/25/1972    Years since quitting: 48.0  . Smokeless tobacco: Never Used  Vaping Use  . Vaping Use: Never used  Substance and Sexual Activity  . Alcohol use: No  . Drug use: No  . Sexual activity: Not Currently     Birth control/protection: Surgical    Comment: hyst  Other Topics Concern  . Not on file  Social History Narrative  . Not on file   Social Determinants of Health   Financial Resource Strain: Low Risk   . Difficulty of Paying Living Expenses: Not hard at all  Food Insecurity: No Food Insecurity  . Worried About Programme researcher, broadcasting/film/video in the Last Year: Never true  . Ran  Out of Food in the Last Year: Never true  Transportation Needs: No Transportation Needs  . Lack of Transportation (Medical): No  . Lack of Transportation (Non-Medical): No  Physical Activity: Inactive  . Days of Exercise per Week: 1 day  . Minutes of Exercise per Session: 0 min  Stress: No Stress Concern Present  . Feeling of Stress : Not at all  Social Connections: Socially Integrated  . Frequency of Communication with Friends and Family: Twice a week  . Frequency of Social Gatherings with Friends and Family: Twice a week  . Attends Religious Services: 1 to 4 times per year  . Active Member of Clubs or Organizations: Yes  . Attends Banker Meetings: 1 to 4 times per year  . Marital Status: Married    Family History  Problem Relation Age of Onset  . Pneumonia Mother   . Healthy Father   . Colon cancer Neg Hx   . Anesthesia problems Neg Hx   . Hypotension Neg Hx   . Malignant hyperthermia Neg Hx   . Pseudochol deficiency Neg Hx     Medications:       Current Outpatient Medications:  .  acetaminophen (TYLENOL) 500 MG tablet, Take 1 tablet (500 mg total) by mouth every 6 (six) hours as needed. (Patient taking differently: Take 500 mg by mouth every 6 (six) hours as needed for mild pain. ), Disp: 30 tablet, Rfl: 0 .  amLODipine (NORVASC) 2.5 MG tablet, TAKE 1 TABLET(2.5 MG) BY MOUTH DAILY (Patient taking differently: Take 2.5 mg by mouth daily. ), Disp: 90 tablet, Rfl: 1 .  atorvastatin (LIPITOR) 80 MG tablet, Take 1 tablet (80 mg total) by mouth daily. (Patient taking differently: Take 40 mg by  mouth daily. ), Disp: 90 tablet, Rfl: 3 .  citalopram (CELEXA) 40 MG tablet, Take 1 tablet (40 mg total) by mouth daily., Disp: 30 tablet, Rfl: 3 .  gabapentin (NEURONTIN) 100 MG capsule, TAKE 1 CAPSULE BY MOUTH AT  BEDTIME, Disp: 90 capsule, Rfl: 3 .  hydrochlorothiazide (HYDRODIURIL) 25 MG tablet, TAKE 1 TABLET BY MOUTH  DAILY, Disp: 90 tablet, Rfl: 3 .  levothyroxine (SYNTHROID) 100 MCG tablet, Take 1 tablet (100 mcg total) by mouth daily., Disp: 90 tablet, Rfl: 3 .  linaclotide (LINZESS) 72 MCG capsule, Take 1 capsule (72 mcg total) by mouth daily before breakfast. (Patient taking differently: Take 72 mcg by mouth daily as needed (constipation.). ), Disp: 90 capsule, Rfl: 3 .  loratadine (CLARITIN) 10 MG tablet, Take 1 tablet (10 mg total) by mouth daily. (Patient taking differently: Take 10 mg by mouth daily as needed for allergies. ), Disp: 30 tablet, Rfl: 3 .  mirtazapine (REMERON) 15 MG tablet, Take 1 tablet (15 mg total) by mouth at bedtime., Disp: 30 tablet, Rfl: 3 .  omeprazole (PRILOSEC) 20 MG capsule, Take 1 capsule (20 mg total) by mouth 2 (two) times daily before a meal., Disp: 60 capsule, Rfl: 3 .  Polyethyl Glycol-Propyl Glycol (SYSTANE) 0.4-0.3 % SOLN, Apply 1 drop to eye 3 (three) times daily as needed (for dry eyes.)., Disp: , Rfl:  .  potassium chloride SA (KLOR-CON) 20 MEQ tablet, Take 1 tablet (20 mEq total) by mouth 2 (two) times daily. Dose increase effective 08/26/2020, Disp: 60 tablet, Rfl: 4 .  Probiotic Product (PROBIOTIC ADVANCED PO), Take 1 capsule by mouth daily. [facility], Disp: , Rfl:   Objective Blood pressure 136/85, pulse 66, height 5\' 3"  (1.6 m), weight 176  lb (79.8 kg).  Gen WDWN NAD  Pertinent ROS No burning with urination, frequency or urgency No nausea, vomiting or diarrhea Nor fever chills or other constitutional symptoms   Labs or studies     Impression Diagnoses this Encounter::   ICD-10-CM   1. Vasomotor symptoms due to  menopause  N95.1    positive response to mirtazipine/remeron(began 08/25/20 by Dr Lodema HongSimpson)    Established relevant diagnosis(es):   Plan/Recommendations: No orders of the defined types were placed in this encounter.   Labs or Scans Ordered: No orders of the defined types were placed in this encounter.   Management:: Continue remeron for the management of new onset night sweats with hot flashes, seems to have resolved completely on the remeron  Follow up No follow-ups on file.        Face to face time:  10 minutes  Greater than 50% of the visit time was spent in counseling and coordination of care with the patient.  The summary and outline of the counseling and care coordination is summarized in the note above.   All questions were answered.

## 2020-09-10 ENCOUNTER — Telehealth: Payer: Self-pay

## 2020-09-10 ENCOUNTER — Ambulatory Visit (INDEPENDENT_AMBULATORY_CARE_PROVIDER_SITE_OTHER): Payer: Medicare Other | Admitting: Internal Medicine

## 2020-09-10 ENCOUNTER — Encounter: Payer: Self-pay | Admitting: Internal Medicine

## 2020-09-10 ENCOUNTER — Other Ambulatory Visit: Payer: Self-pay

## 2020-09-10 VITALS — BP 140/80 | HR 74 | Temp 97.5°F | Ht 63.0 in | Wt 179.4 lb

## 2020-09-10 DIAGNOSIS — R825 Elevated urine levels of drugs, medicaments and biological substances: Secondary | ICD-10-CM | POA: Diagnosis not present

## 2020-09-10 DIAGNOSIS — R232 Flushing: Secondary | ICD-10-CM

## 2020-09-10 DIAGNOSIS — R7989 Other specified abnormal findings of blood chemistry: Secondary | ICD-10-CM | POA: Diagnosis not present

## 2020-09-10 NOTE — Progress Notes (Signed)
Referring Provider: Kerri Perches, MD Primary Care Physician:  Kerri Perches, MD Primary GI:  Dr. Marletta Lor  Chief Complaint  Patient presents with   Weight Loss    now gaining weight, appetite has increased    HPI:   Laura [name] is a 69 y.o. female who presents to the clinic today for follow-up visit.  Patient states since March she has had issues with what she describes as "hot flashes."  States these primarily occur at night where she will have a burning sensation upper chest and face.  We will also start sweating profusely.  Denies any diarrhea or change in bowel movements.  Does note abdominal pain which appears to be periumbilical.  Describes it as a "burning sensation."  Last EGD performed June 2021 with a few fundic gland polyps, otherwise unremarkable.  Biopsies did show chronic gastritis negative for H. pylori.  Colonoscopy 2016 unremarkable with a 10-year recall.  Patient recently underwent 24-hour urine testing for 5 HIAA which was elevated at 13.6.  Patient states that she avoided all tryptophan rich foods that she was supposed to for 3 days prior to the test.  She does not drink alcohol or take NSAIDs.  Past Medical History:  Diagnosis Date   Anxiety    At low risk for fall 08/15/2018   Cystitis    Depression    Gastritis    GERD (gastroesophageal reflux disease)    Hashimoto's thyroiditis    Hx    Hyperlipidemia    Hypertension    Hypothyroidism    Normocytic anemia 2009 Hb 9.9-11.1   Osteopenia    Shoulder pain    Urinary incontinence     Past Surgical History:  Procedure Laterality Date   Bilateral tubal ligation     BIOPSY  04/21/2020   Procedure: BIOPSY;  Surgeon: Corbin Ade, MD;  [facility];  Service: Endoscopy;;   BREAST EXCISIONAL BIOPSY  2010   Excisional biopsy of benign left breast mass -lopoma    BREAST SURGERY     left nreast biopsy   COLONOSCOPY  08/26/2011   SLF: 1. Internal hemorrhoids    COLONOSCOPY N/A 10/06/2015   BJY:NWGN fissure or internal hemorrhoids/mild sigmoid colitis. benign colonic path    colonscopy  2005   Dr. Katrinka Blazing   ESOPHAGOGASTRODUODENOSCOPY  12/2009   chronic gastritis   ESOPHAGOGASTRODUODENOSCOPY N/A 05/17/2017   Dr. Darrick Penna; gastritis, normal small bowel biopsies, multiple gastric polyps. fundic gland polyps   ESOPHAGOGASTRODUODENOSCOPY (EGD) WITH PROPOFOL N/A 04/21/2020   Rourk: multiple gastric polyps, biopsy of one c/w fundic gland. gastric biopsy showed mild chronic gastritis but no H.pylori   GIVENS CAPSULE STUDY N/A 05/30/2017   gastritis, no source for anemia identified   left knee surgery Left March 1,2017   PARTIAL KNEE ARTHROPLASTY Left 08/09/2017   Procedure: LEFT UNICOMPARTMENTAL KNEE;  Surgeon: Ollen Gross, MD;  Location: [facility];  Service: Orthopedics;  Laterality: Left;   POLYPECTOMY  04/21/2020   Procedure: POLYPECTOMY;  Surgeon: Corbin Ade, MD;  [facility];  Service: Endoscopy;;  gastric   Resection of left lobe of thyroid     right carpal tunnel release     rt. knee athroscopy  2004   SAVORY DILATION N/A 05/17/2017   Procedure: SAVORY DILATION;  Surgeon: West Bali, MD;  [facility];  Service: Endoscopy;  Laterality: N/A;   TOTAL ABDOMINAL HYSTERECTOMY  1994   UMBILICAL HERNIA REPAIR  Urethral dilation for stenosis  2009    Current Outpatient Medications  Medication Sig Dispense Refill   acetaminophen (TYLENOL) 500 MG tablet Take 1 tablet (500 mg total) by mouth every 6 (six) hours as needed. (Patient taking differently: Take 500 mg by mouth every 6 (six) hours as needed for mild pain. ) 30 tablet 0   amLODipine (NORVASC) 2.5 MG tablet TAKE 1 TABLET(2.5 MG) BY MOUTH DAILY (Patient taking differently: Take 2.5 mg by mouth daily. ) 90 tablet 1   atorvastatin (LIPITOR) 80 MG tablet Take 1 tablet (80 mg total) by mouth daily. (Patient taking differently: Take 40 mg by mouth daily. ) 90  tablet 3   citalopram (CELEXA) 40 MG tablet Take 1 tablet (40 mg total) by mouth daily. 30 tablet 3   gabapentin (NEURONTIN) 100 MG capsule TAKE 1 CAPSULE BY MOUTH AT  BEDTIME 90 capsule 3   hydrochlorothiazide (HYDRODIURIL) 25 MG tablet TAKE 1 TABLET BY MOUTH  DAILY 90 tablet 3   levothyroxine (SYNTHROID) 100 MCG tablet Take 1 tablet (100 mcg total) by mouth daily. 90 tablet 3   linaclotide (LINZESS) 72 MCG capsule Take 1 capsule (72 mcg total) by mouth daily before breakfast. (Patient taking differently: Take 72 mcg by mouth daily as needed (constipation.). ) 90 capsule 3   loratadine (CLARITIN) 10 MG tablet Take 1 tablet (10 mg total) by mouth daily. (Patient taking differently: Take 10 mg by mouth daily as needed for allergies. ) 30 tablet 3   mirtazapine (REMERON) 15 MG tablet Take 1 tablet (15 mg total) by mouth at bedtime. 30 tablet 3   omeprazole (PRILOSEC) 20 MG capsule Take 1 capsule (20 mg total) by mouth 2 (two) times daily before a meal. 60 capsule 3   Polyethyl Glycol-Propyl Glycol (SYSTANE) 0.4-0.3 % SOLN Apply 1 drop to eye 3 (three) times daily as needed (for dry eyes.).     potassium chloride SA (KLOR-CON) 20 MEQ tablet Take 1 tablet (20 mEq total) by mouth 2 (two) times daily. Dose increase effective 08/26/2020 60 tablet 4   Probiotic Product (PROBIOTIC ADVANCED PO) Take 1 capsule by mouth daily. [facility]     No current facility-administered medications for this visit.    Allergies as of 09/10/2020 - Review Complete 09/10/2020  Allergen Reaction Noted   Cozaar [losartan potassium] Cough 07/04/2018   Dilaudid [hydromorphone hcl] Other (See Comments) 03/01/2011   Levaquin [levofloxacin] Other (See Comments) 07/28/2014   Nsaids Other (See Comments) 01/13/2010   Promethazine  05/17/2017    Family History  Problem Relation Age of Onset   Pneumonia Mother    Healthy Father    Colon cancer Neg Hx    Anesthesia problems Neg Hx     Hypotension Neg Hx    Malignant hyperthermia Neg Hx    Pseudochol deficiency Neg Hx     Social History   Socioeconomic History   Marital status: Married    Spouse name: Not on file   Number of children: 3   Years of education: Not on file   Highest education level: Not on file  Occupational History   Occupation: Designer, [employer] factory producing automobile parts   Tobacco Use   Smoking status: Former Smoker    Packs/day: 0.25    Years: 1.00    Pack years: 0.25    Types: Cigarettes    Quit date: 08/25/1972    Years since quitting: 48.0   Smokeless tobacco: Never Used  Vaping Use   Vaping Use:  Never used  Substance and Sexual Activity   Alcohol use: No   Drug use: No   Sexual activity: Not Currently    Birth control/protection: Surgical    Comment: hyst  Other Topics Concern   Not on file  Social History Narrative   Not on file   Social Determinants of Health   Financial Resource Strain: Low Risk    Difficulty of Paying Living Expenses: Not hard at all  Food Insecurity: No Food Insecurity   Worried About Programme researcher, broadcasting/film/video in the Last Year: Never true   Ran Out of Food in the Last Year: Never true  Transportation Needs: No Transportation Needs   Lack of Transportation (Medical): No   Lack of Transportation (Non-Medical): No  Physical Activity: Inactive   Days of Exercise per Week: 1 day   Minutes of Exercise per Session: 0 min  Stress: No Stress Concern Present   Feeling of Stress : Not at all  Social Connections: Socially Integrated   Frequency of Communication with Friends and Family: Twice a week   Frequency of Social Gatherings with Friends and Family: Twice a week   Attends Religious Services: 1 to 4 times per year   Active Member of [employer] or Organizations: Yes   Attends Banker Meetings: 1 to 4 times per year   Marital Status: Married    Subjective: Review of Systems  Constitutional: Positive for diaphoresis. Negative  for chills and fever.       Flushing  HENT: Negative for congestion and hearing loss.   Eyes: Negative for blurred vision and double vision.  Respiratory: Negative for cough and shortness of breath.   Cardiovascular: Negative for chest pain and palpitations.  Gastrointestinal: Positive for abdominal pain. Negative for blood in stool, constipation, diarrhea, heartburn, melena and vomiting.  Genitourinary: Negative for dysuria and urgency.  Musculoskeletal: Negative for joint pain and myalgias.  Skin: Negative for itching and rash.  Neurological: Negative for dizziness and headaches.  Psychiatric/Behavioral: Negative for depression. The patient is not nervous/anxious.      Objective: BP 140/80    Pulse 74    Temp (!) 97.5 F (36.4 C)    Ht 5\' 3"  (1.6 m)    Wt 179 lb 6.4 oz (81.4 kg)    BMI 31.78 kg/m  Physical Exam Constitutional:      Appearance: Normal appearance.  HENT:     Head: Normocephalic and atraumatic.  Eyes:     Extraocular Movements: Extraocular movements intact.     Conjunctiva/sclera: Conjunctivae normal.  Cardiovascular:     Rate and Rhythm: Normal rate and regular rhythm.  Pulmonary:     Effort: Pulmonary effort is normal.     Breath sounds: Normal breath sounds.  Abdominal:     General: Bowel sounds are normal.     Palpations: Abdomen is soft.  Musculoskeletal:        General: No swelling. Normal range of motion.     Cervical back: Normal range of motion and neck supple.  Skin:    General: Skin is warm and dry.     Coloration: Skin is not jaundiced.  Neurological:     General: No focal deficit present.     Mental Status: She is alert and oriented to person, place, and time.  Psychiatric:        Mood and Affect: Mood normal.        Behavior: Behavior normal.      Assessment: *Elevated 5 HIAA *Flushing *  Abdominal pain  Plan: Patient symptoms are concerning for underlying carcinoid syndrome.  She is adamant that she avoided all medications and  tryptophan rich foods prior to her 24-hour urine collection.  Discussed neuroendocrine tumors in depth with patient today.  I believe the next step would be for her to undergo triple phase CT of her abdomen pelvis to evaluate for neuroendocrine tumor which I will order today.  Pending CT findings, we may need to consider repeat EGD and colonoscopy.  Further recommendations to follow.  09/10/2020 9:47 AM   Disclaimer: This note was dictated with voice recognition software. Similar sounding words can inadvertently be transcribed and may not be corrected upon review.

## 2020-09-10 NOTE — Patient Instructions (Signed)
To further evaluate your abnormal urine tests, we will order CT of your abdomen and pelvis.  Further recommendations to follow.  We will plan on repeat colonoscopy in 2026 for screening purposes.  At [facility] we value your feedback. You may receive a survey about your visit today. Please share your experience as we strive to create trusting relationships with our patients to provide genuine, compassionate, quality care.  We appreciate your understanding and patience as we review any laboratory studies, imaging, and other diagnostic tests that are ordered as we care for you. Our office policy is 5 business days for review of these results, and any emergent or urgent results are addressed in a timely manner for your best interest. If you do not hear from our office in 1 week, please contact us.   We also encourage the use of MyChart, which contains your medical information for your review as well. If you are not enrolled in this feature, an access code is on this after visit summary for your convenience. Thank you for allowing [name] to be involved in your care.  It was great to see you today!  I hope you have a great rest of your fall!!    Blasa Raisch K. Marletta Lor, D.O. Gastroenterology and Hepatology [facility]

## 2020-09-10 NOTE — Telephone Encounter (Signed)
CT abd/pelvis w/wo contrast scheduled for 10/06/20 at 4:00pm, arrive at 3:45pm. Liquids only for 4 hours before test. Called and informed pt of appt. Letter mailed.  No PA needed for CT per [facility] website.

## 2020-09-16 DIAGNOSIS — M5440 Lumbago with sciatica, unspecified side: Secondary | ICD-10-CM | POA: Diagnosis not present

## 2020-09-16 DIAGNOSIS — M47816 Spondylosis without myelopathy or radiculopathy, lumbar region: Secondary | ICD-10-CM | POA: Diagnosis not present

## 2020-09-17 ENCOUNTER — Other Ambulatory Visit (HOSPITAL_COMMUNITY): Payer: Self-pay | Admitting: Family Medicine

## 2020-09-17 ENCOUNTER — Ambulatory Visit: Payer: Medicare Other | Admitting: Family Medicine

## 2020-09-17 DIAGNOSIS — Z1231 Encounter for screening mammogram for malignant neoplasm of breast: Secondary | ICD-10-CM

## 2020-10-06 ENCOUNTER — Ambulatory Visit (HOSPITAL_COMMUNITY)
Admission: RE | Admit: 2020-10-06 | Discharge: 2020-10-06 | Disposition: A | Discharge status: Home / Self Care | Payer: Medicare Other | Source: Ambulatory Visit | Attending: Internal Medicine | Admitting: Internal Medicine

## 2020-10-06 ENCOUNTER — Other Ambulatory Visit: Payer: Self-pay

## 2020-10-06 DIAGNOSIS — Z90710 Acquired absence of both cervix and uterus: Secondary | ICD-10-CM | POA: Diagnosis not present

## 2020-10-06 DIAGNOSIS — R7989 Other specified abnormal findings of blood chemistry: Secondary | ICD-10-CM | POA: Diagnosis present

## 2020-10-06 DIAGNOSIS — K7689 Other specified diseases of liver: Secondary | ICD-10-CM | POA: Insufficient documentation

## 2020-10-06 DIAGNOSIS — R825 Elevated urine levels of drugs, medicaments and biological substances: Secondary | ICD-10-CM | POA: Insufficient documentation

## 2020-10-06 DIAGNOSIS — R232 Flushing: Secondary | ICD-10-CM

## 2020-10-06 LAB — POCT I-STAT CREATININE: Creatinine, Ser: 1 mg/dL (ref 0.44–1.00)

## 2020-10-06 IMAGING — CT CT ABD-PEL WO/W CM
2 of 11 series · 11 of 46 positions shown, 17 images · IV contrast (Omnipaque or Isovue)
Comparison: [DATE]

CLINICAL DATA: Flushing, elevated 5 HIAA, possible carcinoid tumor

EXAM:
CT ABDOMEN AND PELVIS WITHOUT AND WITH CONTRAST
TECHNIQUE: Multidetector CT imaging of the abdomen and pelvis was performed
following the standard protocol before and following the bolus
administration of intravenous contrast.
CONTRAST:  100mL OMNIPAQUE IOHEXOL 300 MG/ML  SOLN

[Series 7: coronal arterial · coronal · arterial · 0.50mm/px · 3 of 101 slices shown, 4 images]
[im 26/101  soft-tissue]
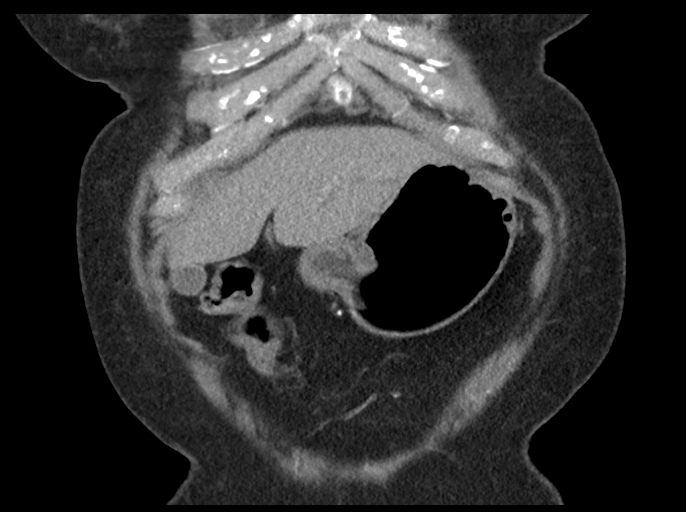
[im 51/101  soft-tissue]
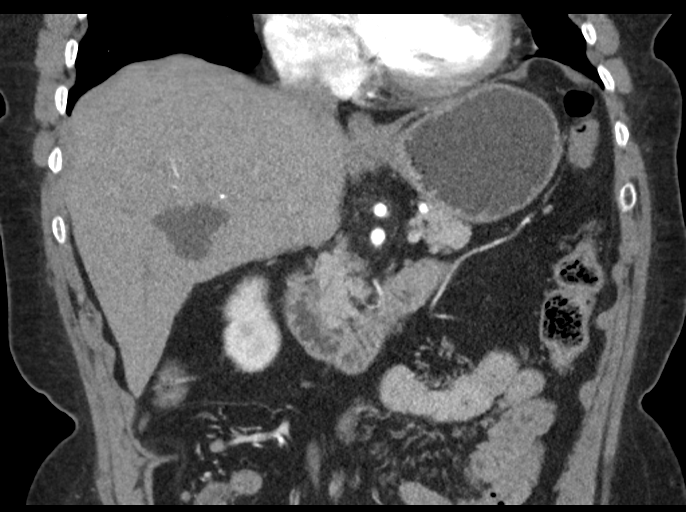
[im 51/101  bone]
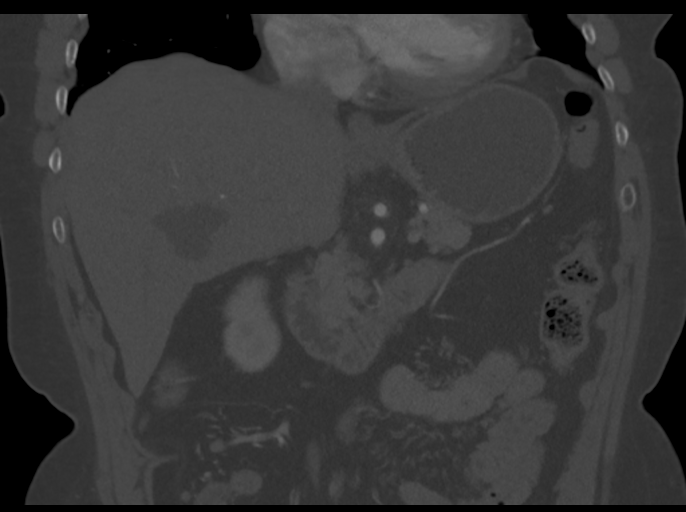
[im 76/101  soft-tissue]
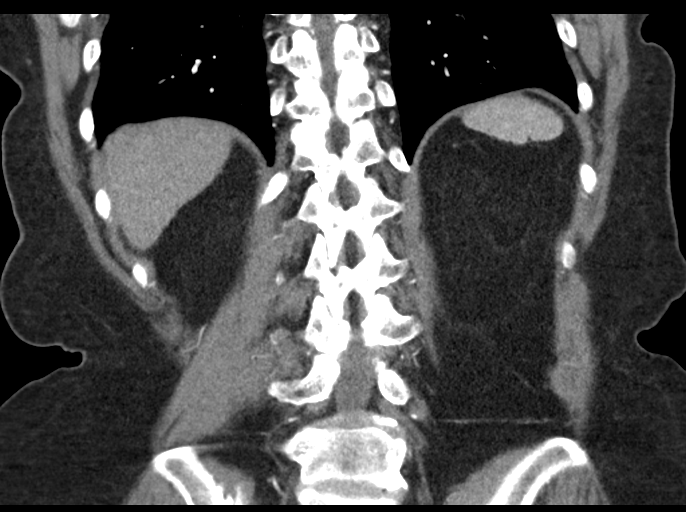

[Series 11: pancreas pv 2.0 · axial · portal-venous · 0.76mm/px · z∈[+1004,+1322]mm · 8 of 205 slices shown, 13 images]
[im 23/205  soft-tissue]
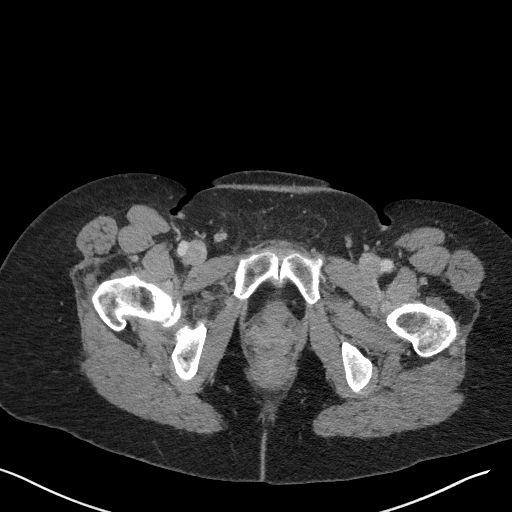
[im 23/205  bone]
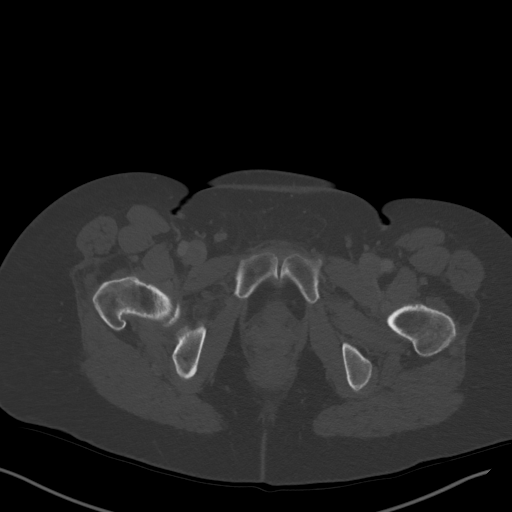
[im 46/205  soft-tissue]
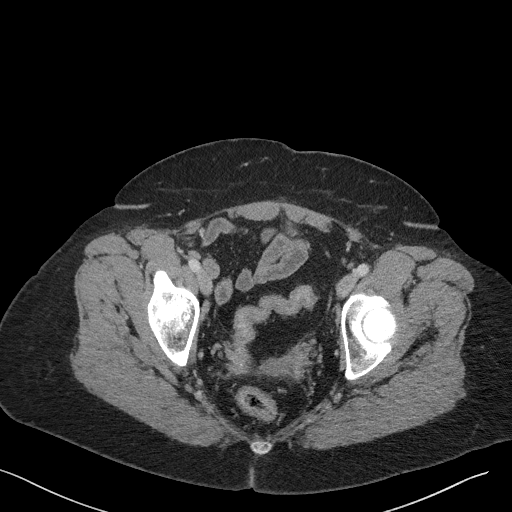
[im 69/205  soft-tissue]
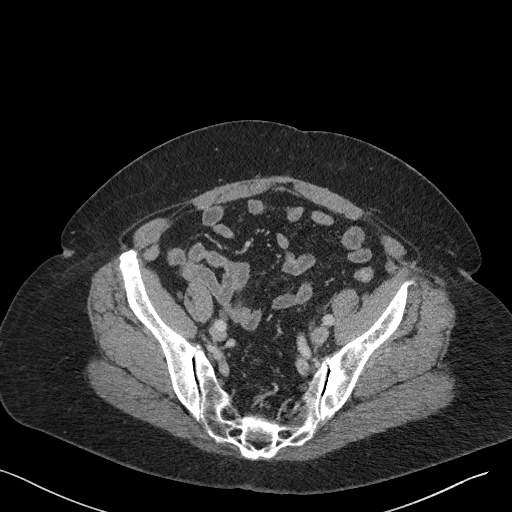
[im 91/205  soft-tissue]
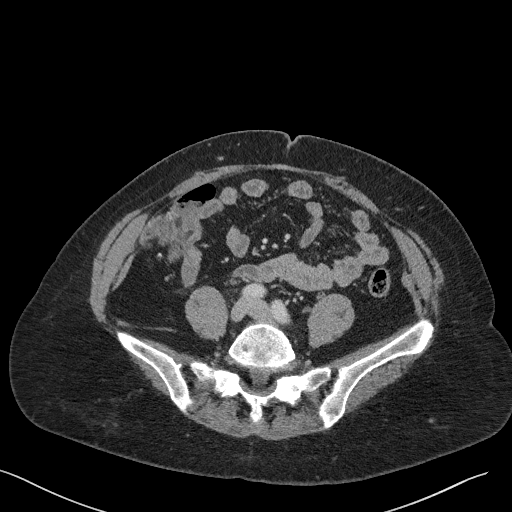
[im 114/205  soft-tissue]
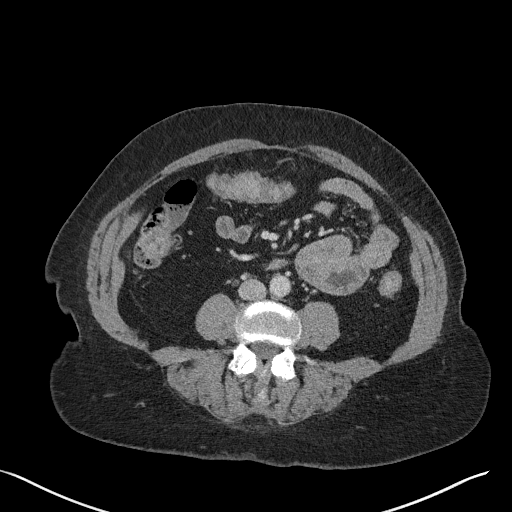
[im 114/205  lung]
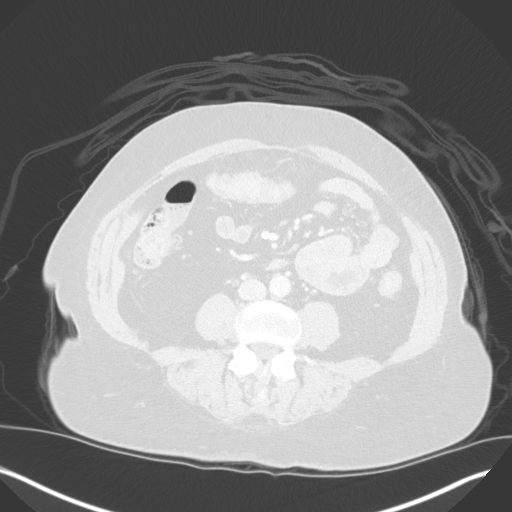
[im 137/205  soft-tissue]
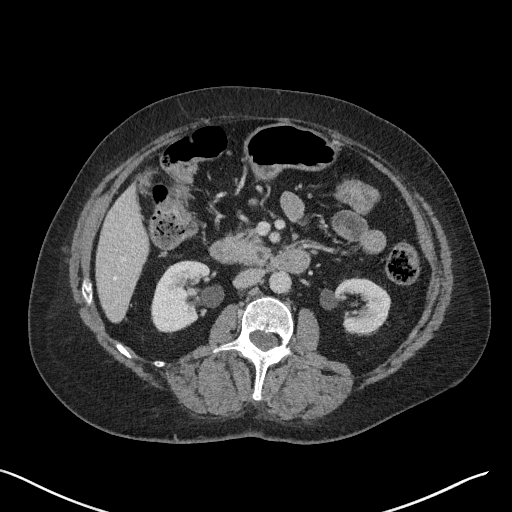
[im 137/205  lung]
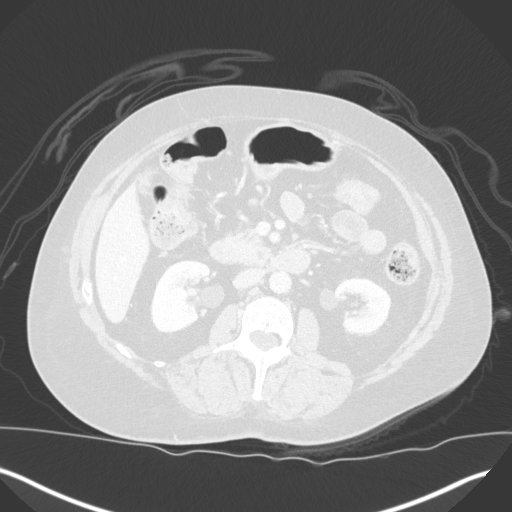
[im 159/205  soft-tissue]
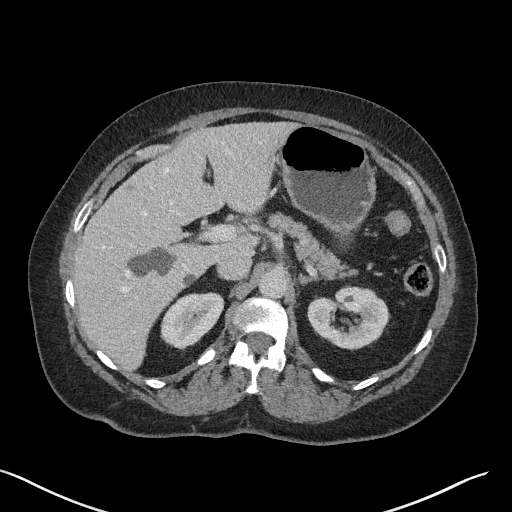
[im 159/205  lung]
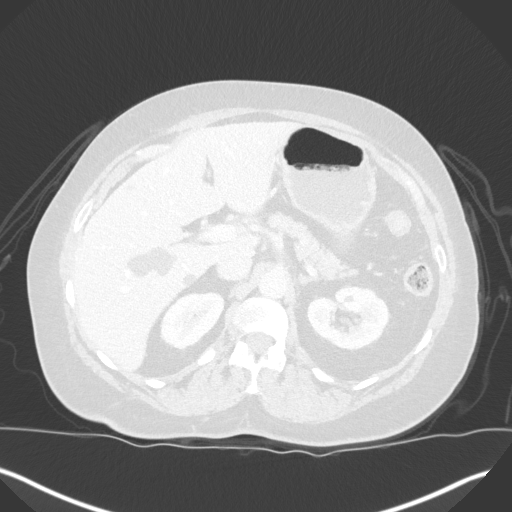
[im 182/205  soft-tissue]
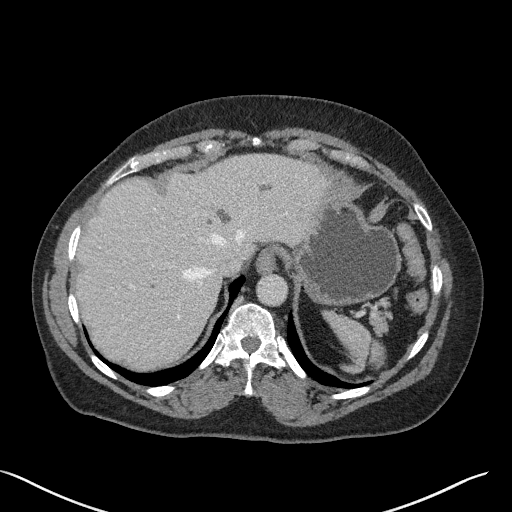
[im 182/205  lung]
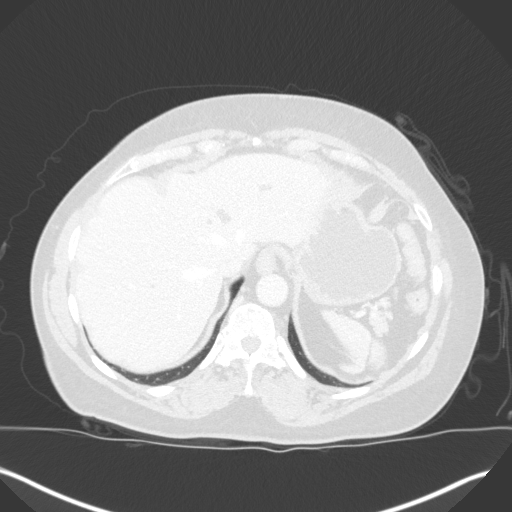

[11 of 46 positions shown; findings below may reference images not displayed]

FINDINGS: Lower chest: No acute abnormality.

Hepatobiliary: No solid liver abnormality is seen. Multiple simple
liver cysts. No gallstones, gallbladder wall thickening, or biliary
dilatation.

Pancreas: Unremarkable. No pancreatic ductal dilatation or
surrounding inflammatory changes.

Spleen: Normal in size without significant abnormality.

Adrenals/Urinary Tract: Adrenal glands are unremarkable. Kidneys are
normal, without renal calculi, solid lesion, or hydronephrosis.
Bladder is unremarkable.

Stomach/Bowel: Stomach is within normal limits. Appendix is not
clearly visualized. No evidence of bowel wall thickening,
distention, or inflammatory changes.

Vascular/Lymphatic: No significant vascular findings are present. No
enlarged abdominal or pelvic lymph nodes.

Reproductive: Status post hysterectomy.

Other: No abdominal wall hernia or abnormality. No abdominopelvic
ascites.

Musculoskeletal: No acute or significant osseous findings.
IMPRESSION: 1.  No mass or other CT findings to explain the patient's symptoms.

2.  Status post hysterectomy.

## 2020-10-06 MED ORDER — IOHEXOL 300 MG/ML  SOLN
100.0000 mL | Freq: Once | INTRAMUSCULAR | Status: AC | PRN
  Administered 2020-10-06: 100 mL via INTRAVENOUS

## 2020-10-16 ENCOUNTER — Other Ambulatory Visit: Payer: Self-pay

## 2020-10-16 ENCOUNTER — Telehealth: Payer: Self-pay | Admitting: *Deleted

## 2020-10-16 ENCOUNTER — Ambulatory Visit (HOSPITAL_COMMUNITY)
Admission: RE | Admit: 2020-10-16 | Discharge: 2020-10-16 | Disposition: A | Discharge status: Home / Self Care | Payer: Medicare Other | Source: Ambulatory Visit | Attending: Family Medicine | Admitting: Family Medicine

## 2020-10-16 DIAGNOSIS — Z1231 Encounter for screening mammogram for malignant neoplasm of breast: Secondary | ICD-10-CM | POA: Insufficient documentation

## 2020-10-16 IMAGING — MG DIGITAL SCREENING BILAT W/ TOMO W/ CAD
8 series · 8 of 24 positions shown · non-contrast
Comparison: Previous exam(s).

CLINICAL DATA: Screening.

EXAM:
DIGITAL SCREENING BILATERAL MAMMOGRAM WITH TOMO AND CAD

[R CC synth-2D]
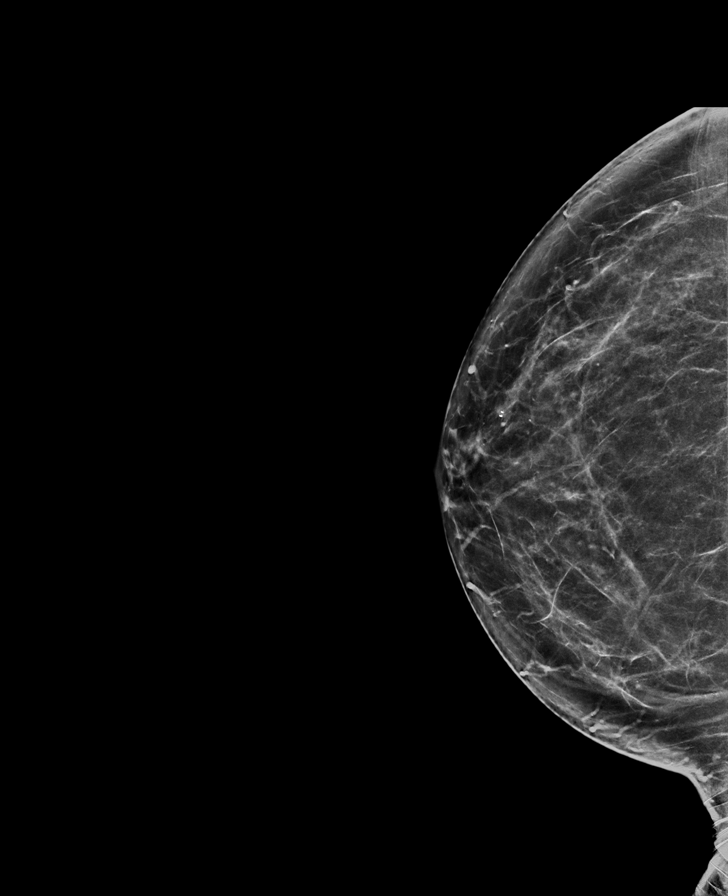

[R MLO synth-2D]
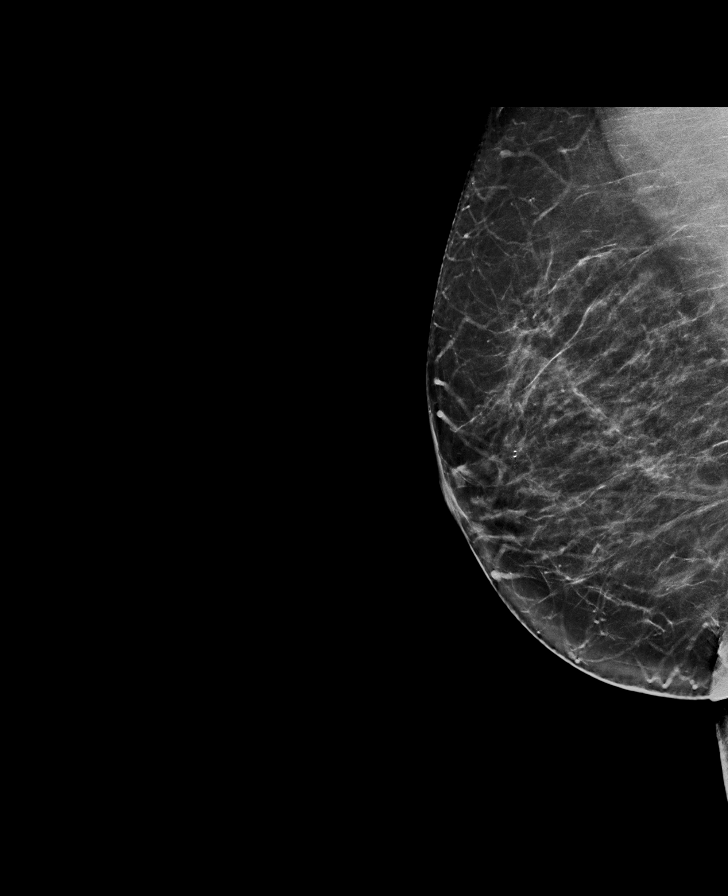

[L MLO synth-2D]
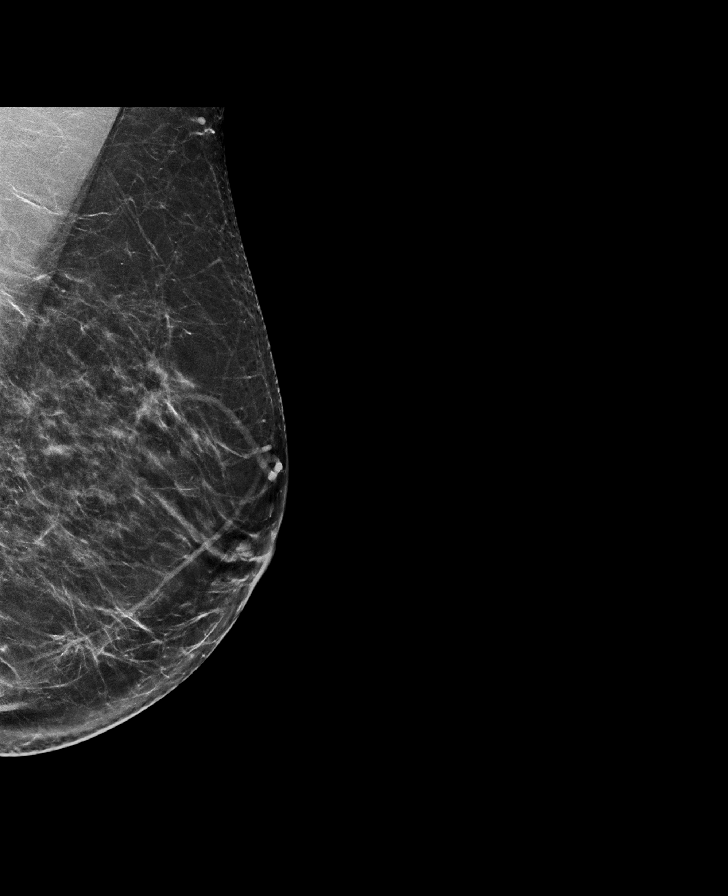

[L CC synth-2D]
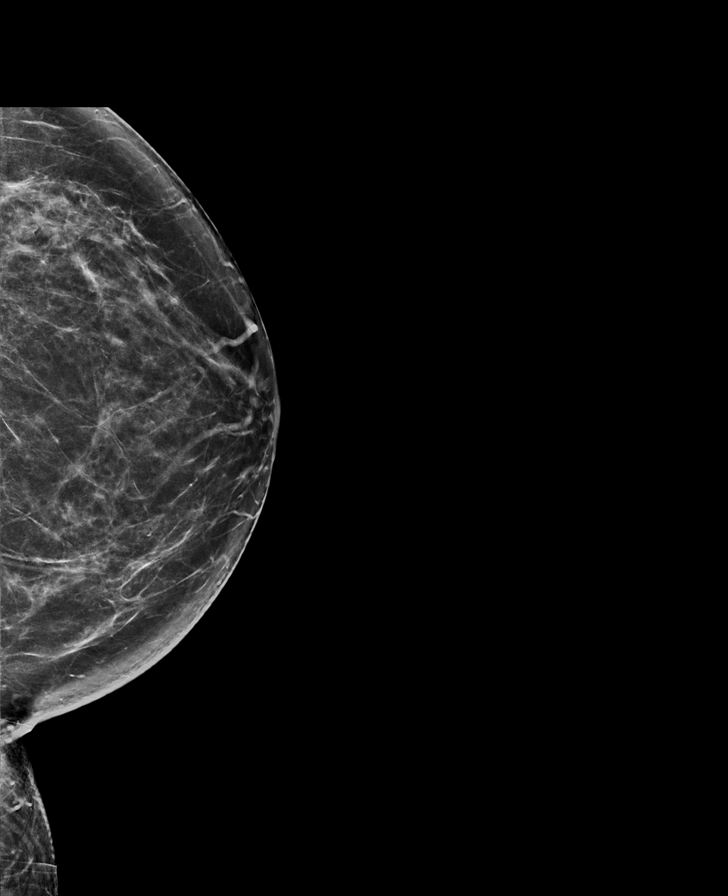

[L MLO tomo · tomo slice 37/73.0]
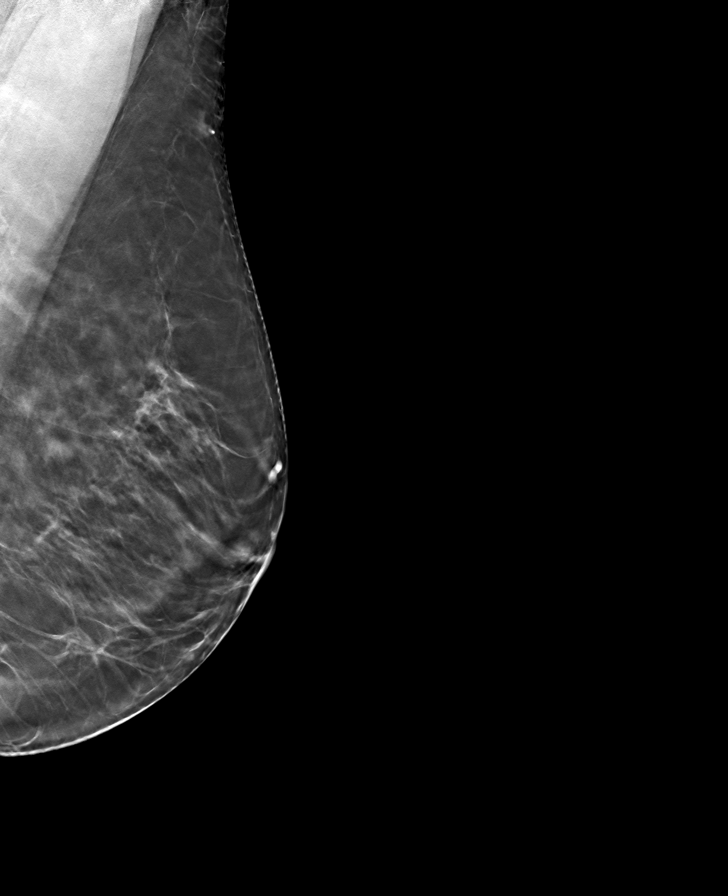

[R MLO tomo · tomo slice 39/77.0]
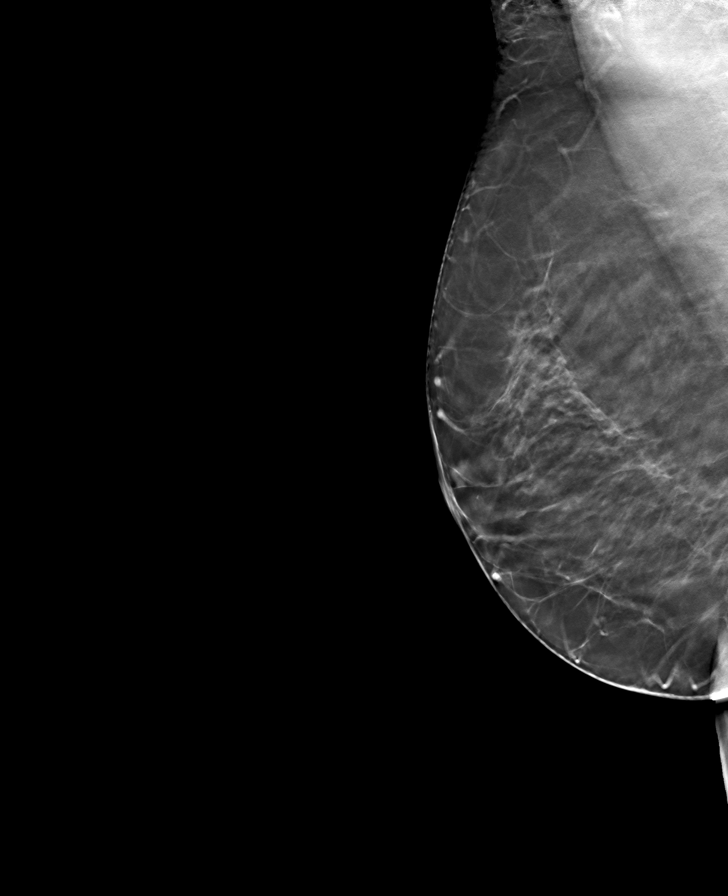

[R CC tomo · tomo slice 39/76.0]
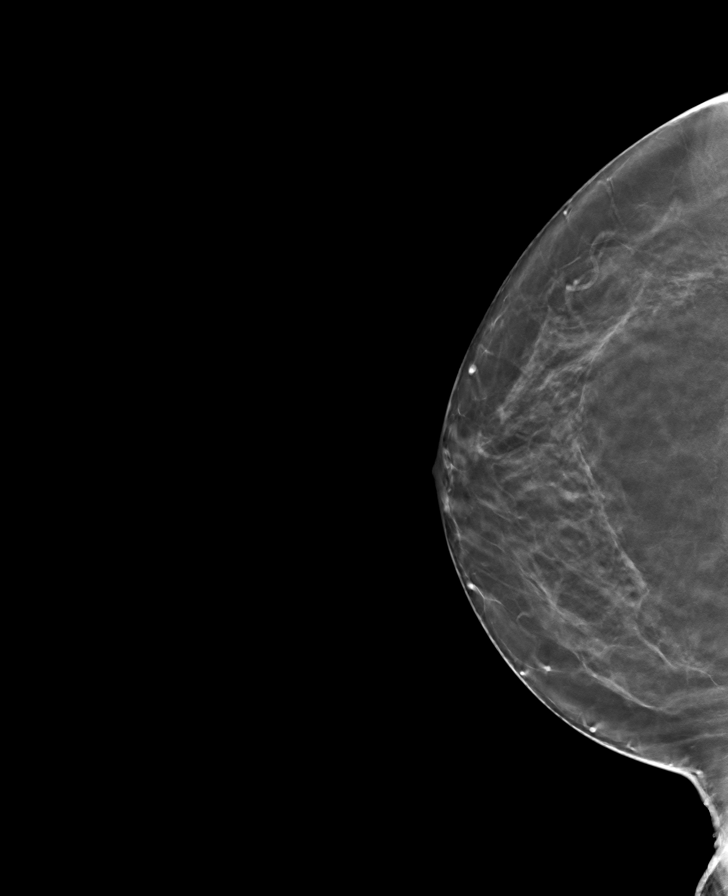

[L CC tomo · tomo slice 38/75.0]
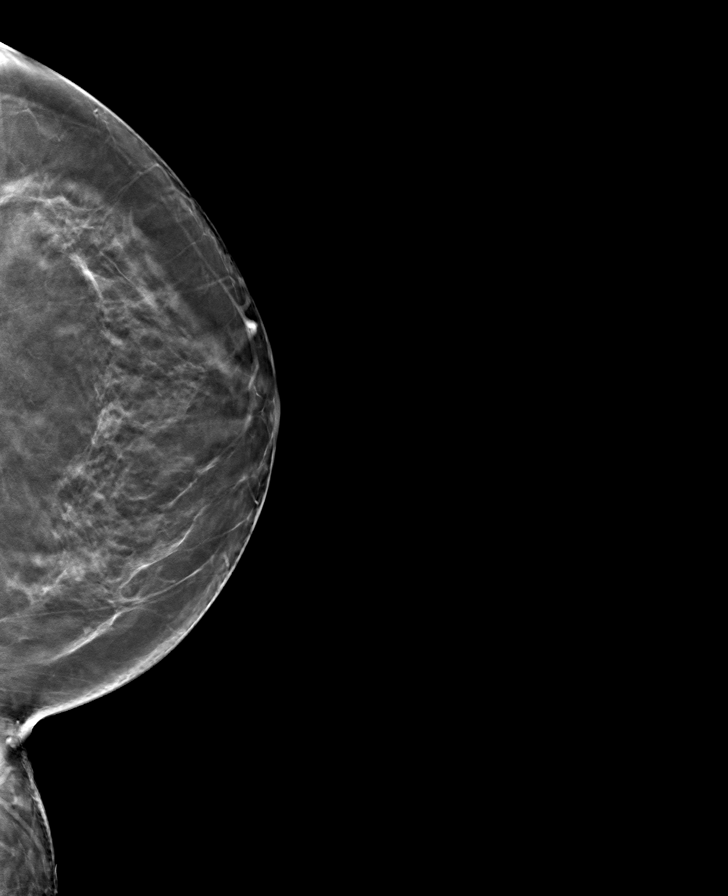

[8 of 24 positions shown; findings below may reference images not displayed]

ACR Breast Density Category b: There are scattered areas of
fibroglandular density.
FINDINGS: There are no findings suspicious for malignancy. Images were
processed with CAD.
IMPRESSION: No mammographic evidence of malignancy. A result letter of this
screening mammogram will be mailed directly to the patient.

RECOMMENDATION:
Screening mammogram in one year. (Code:[TQ])

BI-RADS CATEGORY  1: Negative.

## 2020-10-16 NOTE — Telephone Encounter (Signed)
Informed pt that her CT was negative for any tumors. Informed her to follow up with Dr. Marletta Lor in 3 months per his recommendations.  Pt said that she is having issues with a burning sensation in her stomach and requested appointment ASAP.  Scheduled her for appointment with Ermalinda Memos, PA on Monday 10/19/2020.

## 2020-10-17 NOTE — Progress Notes (Signed)
Referring Provider: Fayrene Helper, MD Primary Care Physician:  Fayrene Helper, MD Primary GI Physician: Dr. Abbey Chatters  Chief Complaint  Patient presents with   burning in abd    Burning feeling comes up from abd, now burning in arms/hands. Comes and goes    HPI:   Laura [name] is a 69 y.o. female presenting today with a history of chronic GERD, constipation, history of anemia status post thorough evaluation.  Over the last several months, we have been evaluating for nocturnal flushing which began in March 2021 along with weight loss. Colonoscopy 2016 unremarkable with a 10-year recall. Givens capsule in 2018 with gastritis. Most recent EGD June 2021 with a few fundic gland polyps, otherwise unremarkable.  Biopsies did show chronic gastritis negative for H. pylori.    Additional workup has included:  CT June 2021 with no significant findings.  HIDA September 2021 with slightly low EF at 27%.  labs that had been completed included catecholamines,metanephrines, ESR, CRP, tryptase all of which were ok. 5 HIAA elevated at 13.6. TSH borderline low. CT A/P with and without contrast 10/06/20 with no acute findings.   Today:  Feels nocturnal flushing symptoms are becoming a little more intense.  States she wakes up with mild burning sensation starting in her mid abdomen that radiates up to her chest, across her chest and shoulders bilaterally, and down her arms into her hands causing her hands to be very hot.  For a brief period in October, her symptoms seem to resolve but started back in November.  She reports no changes between October and November with her daily routine, medications, stress levels, dietary intake, or any other change that she can think of.  Typically, symptoms occur once every night/early morning.  More recently, she seems to be waking around 5 AM.  Symptoms come and go within about 5 minutes.    States she is starting to feel symptoms now which is the first time she  has ever felt symptoms during the day.  She did not eat breakfast this morning.  She has slight sweat on her forehead, but denies shortness of breath, lightheadedness, dizziness, chest pain, numbness or tingling, headache, abdominal pain, nausea, vomiting, or prodrome.  Symptoms resolved within about 5 minutes.  Unable to tell me if symptoms feel similar to hot flashes with menopause.  In general, symptoms are not associated with meals. She has gained 5 pounds over the last month.  States she is eating very well.  GERD seems to be fairly well controlled although she does report indigestion.  Overall, feels Protonix 40 mg twice daily worked better than her current regimen of omeprazole 20 mg twice daily.  Tries to watch what she is eating.  No regular fried/fatty foods.  No spicy foods.  Bowels are moving well with BMs daily to every other day.  No diarrhea or constipation.  Has not needed Linzess in quite some time.  No BRBPR or melena.   Taking Celexa for her "nerves". Occasional anxiety.  Denies flushing symptoms being associated with any sort of emotional stress.  Typically drinks 1 soda daily and tea.  States she probably drinks soda and or tea when she is collecting urine for 5 HIAA testing.  Also states she may have taken Tylenol for a headache.    No alcohol.  Past Medical History:  Diagnosis Date   Anxiety    At low risk for fall 08/15/2018   Cystitis    Depression  Gastritis    GERD (gastroesophageal reflux disease)    Hashimoto's thyroiditis    Hx    Hyperlipidemia    Hypertension    Hypothyroidism    Normocytic anemia 2009 Hb 9.9-11.1   Osteopenia    Shoulder pain    Urinary incontinence     Past Surgical History:  Procedure Laterality Date   Bilateral tubal ligation     BIOPSY  04/21/2020   Procedure: BIOPSY;  Surgeon: Daneil Dolin, MD;  Location: [facility];  Service: Endoscopy;;   BREAST EXCISIONAL BIOPSY  2010   Excisional biopsy of benign  left breast mass -lopoma    BREAST SURGERY     left nreast biopsy   COLONOSCOPY  08/26/2011   SLF: 1. Internal hemorrhoids   COLONOSCOPY N/A 10/06/2015   FHL:KTGY fissure or internal hemorrhoids/mild sigmoid colitis. benign colonic path    colonscopy  2005   Dr. Tamala Julian   ESOPHAGOGASTRODUODENOSCOPY  12/2009   chronic gastritis   ESOPHAGOGASTRODUODENOSCOPY N/A 05/17/2017   Dr. Oneida Alar; gastritis, normal small bowel biopsies, multiple gastric polyps. fundic gland polyps   ESOPHAGOGASTRODUODENOSCOPY (EGD) WITH PROPOFOL N/A 04/21/2020   Rourk: multiple gastric polyps, biopsy of one c/w fundic gland. gastric biopsy showed mild chronic gastritis but no H.pylori   GIVENS CAPSULE STUDY N/A 05/30/2017   gastritis, no source for anemia identified   left knee surgery Left March 1,2017   PARTIAL KNEE ARTHROPLASTY Left 08/09/2017   Procedure: LEFT UNICOMPARTMENTAL KNEE;  Surgeon: Gaynelle Arabian, MD;  Location: [facility];  Service: Orthopedics;  Laterality: Left;   POLYPECTOMY  04/21/2020   Procedure: POLYPECTOMY;  Surgeon: Daneil Dolin, MD;  Location: [facility];  Service: Endoscopy;;  gastric   Resection of left lobe of thyroid     right carpal tunnel release     rt. knee athroscopy  2004   SAVORY DILATION N/A 05/17/2017   Procedure: SAVORY DILATION;  Surgeon: Danie Binder, MD;  Location: [facility];  Service: Endoscopy;  Laterality: N/A;   TOTAL ABDOMINAL HYSTERECTOMY  5638   UMBILICAL HERNIA REPAIR     Urethral dilation for stenosis  2009    Current Outpatient Medications  Medication Sig Dispense Refill   acetaminophen (TYLENOL) 500 MG tablet Take 1 tablet (500 mg total) by mouth every 6 (six) hours as needed. (Patient taking differently: Take 500 mg by mouth every 6 (six) hours as needed for mild pain.) 30 tablet 0   atorvastatin (LIPITOR) 80 MG tablet Take 1 tablet (80 mg total) by mouth daily. (Patient taking differently: Take 40 mg by mouth daily.) 90 tablet 3    celecoxib (CELEBREX) 200 MG capsule Take 200 mg by mouth daily as needed.     citalopram (CELEXA) 40 MG tablet Take 1 tablet (40 mg total) by mouth daily. 30 tablet 3   gabapentin (NEURONTIN) 100 MG capsule TAKE 1 CAPSULE BY MOUTH AT  BEDTIME 90 capsule 3   hydrochlorothiazide (HYDRODIURIL) 25 MG tablet TAKE 1 TABLET BY MOUTH  DAILY 90 tablet 3   levothyroxine (SYNTHROID) 100 MCG tablet Take 1 tablet (100 mcg total) by mouth daily. 90 tablet 3   linaclotide (LINZESS) 72 MCG capsule Take 1 capsule (72 mcg total) by mouth daily before breakfast. (Patient taking differently: Take 72 mcg by mouth daily as needed (constipation.).) 90 capsule 3   loratadine (CLARITIN) 10 MG tablet Take 1 tablet (10 mg total) by mouth daily. (Patient taking differently: Take 10 mg by mouth daily as needed for  allergies.) 30 tablet 3   mirtazapine (REMERON) 15 MG tablet Take 1 tablet (15 mg total) by mouth at bedtime. 30 tablet 3   omeprazole (PRILOSEC) 20 MG capsule Take 1 capsule (20 mg total) by mouth 2 (two) times daily before a meal. 60 capsule 3   Polyethyl Glycol-Propyl Glycol 0.4-0.3 % SOLN Apply 1 drop to eye 3 (three) times daily as needed (for dry eyes.).     potassium chloride SA (KLOR-CON) 20 MEQ tablet Take 1 tablet (20 mEq total) by mouth 2 (two) times daily. Dose increase effective 08/26/2020 60 tablet 4   Probiotic Product (PROBIOTIC ADVANCED PO) Take 1 capsule by mouth daily. [facility]     amLODipine (NORVASC) 2.5 MG tablet TAKE 1 TABLET(2.5 MG) BY MOUTH DAILY 90 tablet 1   amLODipine (NORVASC) 2.5 MG tablet TAKE 1 TABLET(2.5 MG) BY MOUTH DAILY 90 tablet 1   No current facility-administered medications for this visit.    Allergies as of 10/19/2020 - Review Complete 10/19/2020  Allergen Reaction Noted   Cozaar [losartan potassium] Cough 07/04/2018   Dilaudid [hydromorphone hcl] Other (See Comments) 03/01/2011   Levaquin [levofloxacin] Other (See Comments) 07/28/2014    Nsaids Other (See Comments) 01/13/2010   Promethazine  05/17/2017    Family History  Problem Relation Age of Onset   Pneumonia Mother    Healthy Father    Colon cancer Neg Hx    Anesthesia problems Neg Hx    Hypotension Neg Hx    Malignant hyperthermia Neg Hx    Pseudochol deficiency Neg Hx     Social History   Socioeconomic History   Marital status: Married    Spouse name: Not on file   Number of children: 3   Years of education: Not on file   Highest education level: Not on file  Occupational History   Occupation: Production manager producing automobile parts   Tobacco Use   Smoking status: Former Smoker    Packs/day: 0.25    Years: 1.00    Pack years: 0.25    Types: Cigarettes    Quit date: 08/25/1972    Years since quitting: 48.1   Smokeless tobacco: Never Used  Vaping Use   Vaping Use: Never used  Substance and Sexual Activity   Alcohol use: No   Drug use: No   Sexual activity: Not Currently    Birth control/protection: Surgical    Comment: hyst  Other Topics Concern   Not on file  Social History Narrative   Not on file   Social Determinants of Health   Financial Resource Strain: Low Risk    Difficulty of Paying Living Expenses: Not hard at all  Food Insecurity: No Food Insecurity   Worried About Charity fundraiser in the Last Year: Never true   [place] in the Last Year: Never true  Transportation Needs: No Transportation Needs   Lack of Transportation (Medical): No   Lack of Transportation (Non-Medical): No  Physical Activity: Inactive   Days of Exercise per Week: 1 day   Minutes of Exercise per Session: 0 min  Stress: No Stress Concern Present   Feeling of Stress : Not at all  Social Connections: Socially Integrated   Frequency of Communication with Friends and Family: Twice a week   Frequency of Social Gatherings with Friends and Family: Twice a week   Attends Religious Services: 1 to 4 times per year    Active Member of [employer] or Organizations: Yes  Attends Archivist Meetings: 1 to 4 times per year   Marital Status: Married    Review of Systems: Gen: Denies fever, chills, cold or flulike symptoms, presyncope, syncope. CV: Denies chest pain or palpitations Resp: Denies dyspnea or cough. GI: See HPI Derm: Denies rash Psych: Admits to mild anxiety. Heme: See HPI  Physical Exam: BP 128/84    Pulse 70    Temp (!) 96.6 F (35.9 C) (Temporal)    Ht 5' 3"  (1.6 m)    Wt 184 lb 12.8 oz (83.8 kg)    BMI 32.74 kg/m  General:   Alert and oriented. No distress noted. Pleasant and cooperative.  Head:  Normocephalic and atraumatic. Eyes:  Conjuctiva clear without scleral icterus. Heart:  S1, S2 present without murmurs appreciated. Lungs:  Clear to auscultation bilaterally. No wheezes, rales, or rhonchi. No distress.  Abdomen: +BS, soft, non-tender and non-distended. No rebound or guarding. No HSM or masses noted. Msk:  Symmetrical without gross deformities. Normal posture. Extremities:  Without edema. Neurologic:  Alert and  oriented x4 Psych:  Normal mood and affect.

## 2020-10-19 ENCOUNTER — Encounter: Payer: Self-pay | Admitting: Gastroenterology

## 2020-10-19 ENCOUNTER — Ambulatory Visit: Payer: Medicare Other | Admitting: Gastroenterology

## 2020-10-19 ENCOUNTER — Other Ambulatory Visit: Payer: Self-pay | Admitting: Family Medicine

## 2020-10-19 ENCOUNTER — Other Ambulatory Visit: Payer: Self-pay

## 2020-10-19 VITALS — BP 128/84 | HR 70 | Temp 96.6°F | Ht 63.0 in | Wt 184.8 lb

## 2020-10-19 DIAGNOSIS — K219 Gastro-esophageal reflux disease without esophagitis: Secondary | ICD-10-CM

## 2020-10-19 DIAGNOSIS — R232 Flushing: Secondary | ICD-10-CM | POA: Diagnosis not present

## 2020-10-19 NOTE — Patient Instructions (Signed)
Please increase omeprazole to 40 mg twice daily 30 minutes before breakfast and dinner.   Please call with a progress report in 1-2 weeks to let me know if this works well, and I will send in a prescription to your pharmacy.  Avoid fried, fatty, greasy, spicy, citrus foods. All meats should be lean (poultry or fish) and baked, boiled, or broiled. Avoid caffeine and carbonated beverages. Avoid eating within 3 hours of lying down.  I am going to discuss your case further with Dr. Marletta Lor.  We will call you with further recommendations.  Ermalinda Memos, PA-C [facility]

## 2020-10-20 ENCOUNTER — Encounter: Payer: Self-pay | Admitting: Gastroenterology

## 2020-10-20 DIAGNOSIS — M47816 Spondylosis without myelopathy or radiculopathy, lumbar region: Secondary | ICD-10-CM | POA: Diagnosis not present

## 2020-10-20 NOTE — Assessment & Plan Note (Addendum)
69 year old female presenting today for further evaluation of nocturnal flushing which she has been experiencing since March 2021, but feels symptoms are becoming more intense. No identified triggers.  She describes waking up with mild burning in her upper abdomen that radiates up through her chest across her chest/shoulders bilaterally, into her arms, and down to her hands making her hands feel extremely hot.  Symptoms come and go within about 5 minutes. Slight sweating at times. She was also experiencing weight loss initially, but she has actually gained 5 pounds over the last month.  States she is eating well.  Mild breakthrough indigestion symptoms but no other significant upper or lower GI symptoms.  Patient reports she has never had any daytime symptoms until this morning in our office during our visit. Symptoms came on gradually and resolved within about 5 minutes. Slight sweat on her forehead, but denies lightheadedness, dizziness, numbness or tingling, headaches, prodrome, shortness of breath, chest pain, vision changes, abdominal pain, or any other associated symptoms.  No diarrhea.  Cannot member if symptoms are similar to hot flashes with menopause.   Significant work-up thus far with EGD June 2021 with a few fundic gland polyps, otherwise unremarkable.  Biopsies with chronic gastritis, negative for H. pylori. CT A/P June 2021 with no significant findings. HIDA September 2021 with slightly low EF at 27%, but no current symptoms to suggest biliary dyskinesia. Labs including catecholamines,metanephrines, ESR, CRP, tryptase all of which were ok. TSH borderline low. 5 HIAA elevated at 13.6. She does report drinking caffeinated beferages and may have taken Tyelnol which can affect 5-HIAA levels. CT A/P with and without contrast 10/06/20 with no acute findings.   Etiology is not clear. Suspect we need to complete additional work-up to rule out carcinoid/neuroendocrine tumor.  Cannot rule out other hormonal  abnormalities, influence from borderline low TSH, or other non-GI etiologies.  Notably, patient has history of goiter status post left hemithyroidectomy 20+ years ago.  I am going to discuss patient's case with Dr. Abbey Chatters. Suspect we will need to pursue somatostatin receptor imaging to further evaluate for cardioid tumor. May also need to consider updating colonoscopy if imaging is unrevealing as last colonoscopy was in 2016. Patient is also likely going to need further evaluation with endocrinology and GYN.

## 2020-10-20 NOTE — Assessment & Plan Note (Addendum)
Chronic.  Currently on omeprazole 20 mg twice daily.  Felt Protonix 40 mg twice daily worked better for her.  In general, heartburn type symptoms are under good control, but she does continue with some indigestion.  Denies dysphagia.  Plan: Increase omeprazole to 40 mg twice daily.  Patient is to call with progress report in 1-2 weeks. Also advised to:  Avoid fried, fatty, greasy, spicy, citrus foods.  All meats should be lean (poultry or fish) and baked, boiled, or broiled.  Avoid caffeine and carbonated beverages.  Avoid eating within 3 hours of lying down.

## 2020-10-21 ENCOUNTER — Other Ambulatory Visit (HOSPITAL_COMMUNITY): Payer: Self-pay | Admitting: Internal Medicine

## 2020-10-21 ENCOUNTER — Telehealth: Payer: Self-pay | Admitting: Gastroenterology

## 2020-10-21 DIAGNOSIS — R7989 Other specified abnormal findings of blood chemistry: Secondary | ICD-10-CM

## 2020-10-21 DIAGNOSIS — R232 Flushing: Secondary | ICD-10-CM

## 2020-10-21 NOTE — Telephone Encounter (Signed)
Please let patient know I have discussed her case with Dr. Marletta Lor.  We recommend she have a specialized PET scan for further evaluation of elevated 5 HIAA and nocturnal flushing. If this scan is negative, we will need to consider updating her colonoscopy.   [facility] Clinical Pool: I have discussed this with nuclear medicine at [facility].  Patient will have to go to [facility] to have scan completed. Scheduling through [employer] We do have an order in our system- NM PET (NETSPOT GA 68 DOTATATE) Skull Base to Mid Thigh.  Dx: Flushing, elevated 5 HIAA, evaluate for carcinoid/neuroendocrine tumor.

## 2020-10-21 NOTE — Progress Notes (Signed)
Cc'ed to pcp °

## 2020-10-22 NOTE — Addendum Note (Signed)
Addended by: Sandria Senter C on: 10/22/2020 11:47 AM   Modules accepted: Orders

## 2020-10-22 NOTE — Telephone Encounter (Signed)
Called Central Scheduling and was transferred to nuc med at [facility]. Caitlyn advised they don't schedule appt. Caitlyn transferred call to Ladona Ridgel [phone]). LMOVM to inform Ladona Ridgel appt is needed.

## 2020-10-27 ENCOUNTER — Ambulatory Visit (INDEPENDENT_AMBULATORY_CARE_PROVIDER_SITE_OTHER): Payer: Medicare Other | Admitting: Family Medicine

## 2020-10-27 ENCOUNTER — Other Ambulatory Visit: Payer: Self-pay

## 2020-10-27 ENCOUNTER — Encounter: Payer: Self-pay | Admitting: Family Medicine

## 2020-10-27 VITALS — BP 132/84 | HR 74 | Resp 15 | Ht 63.0 in | Wt 182.1 lb

## 2020-10-27 DIAGNOSIS — E785 Hyperlipidemia, unspecified: Secondary | ICD-10-CM

## 2020-10-27 DIAGNOSIS — J3089 Other allergic rhinitis: Secondary | ICD-10-CM | POA: Diagnosis not present

## 2020-10-27 DIAGNOSIS — Z0001 Encounter for general adult medical examination with abnormal findings: Secondary | ICD-10-CM | POA: Diagnosis not present

## 2020-10-27 DIAGNOSIS — I10 Essential (primary) hypertension: Secondary | ICD-10-CM

## 2020-10-27 DIAGNOSIS — J029 Acute pharyngitis, unspecified: Secondary | ICD-10-CM

## 2020-10-27 DIAGNOSIS — Z1211 Encounter for screening for malignant neoplasm of colon: Secondary | ICD-10-CM

## 2020-10-27 DIAGNOSIS — E559 Vitamin D deficiency, unspecified: Secondary | ICD-10-CM

## 2020-10-27 MED ORDER — AZITHROMYCIN 250 MG PO TABS: ORAL_TABLET | ORAL | 0 refills | Status: DC

## 2020-10-27 MED ORDER — MONTELUKAST SODIUM 10 MG PO TABS: 10.0000 mg | ORAL_TABLET | Freq: Every day | ORAL | 3 refills | Status: DC

## 2020-10-27 MED ORDER — PREDNISONE 5 MG PO TABS: 5.0000 mg | ORAL_TABLET | Freq: Two times a day (BID) | ORAL | 0 refills | Status: AC

## 2020-10-27 NOTE — Assessment & Plan Note (Signed)
z pack prescribed 

## 2020-10-27 NOTE — Assessment & Plan Note (Signed)

## 2020-10-27 NOTE — Assessment & Plan Note (Signed)
Commit to daily montelukast and short course of prednisone prescribed

## 2020-10-27 NOTE — Progress Notes (Signed)
    Laura [name]     MRN: [id number]      DOB: Apr 30, 1951  HPI: Patient is in for annual physical exam. 1 week h/o white mucus draining down throat which  Feels raw PE:BP 132/84   Pulse 74   Resp 15   Ht 5\' 3"  (1.6 m)   Wt 182 lb 1.9 oz (82.6 kg)   SpO2 95%   BMI 32.26 kg/m   Pleasant  female, alert and oriented x 3, in no cardio-pulmonary distress. Afebrile. HEENT No facial trauma or asymetry. Maxillary and ethmoid Sinuses  tender.  Extra occullar muscles intact.. TM clear with good light reflex bilaterally, bilateral anterio cervical adenitis, oropharynx mildly erythematous,JVD or thyromegaly.No bruits.  Chest: Clear to ascultation bilaterally.No crackles or wheezes. Non tender to palpation  Breast: No asymetry,no masses or lumps. No tenderness. No nipple discharge or inversion. No axillary or supraclavicular adenopathy  Cardiovascular system; Heart sounds normal,  S1 and  S2 ,no S3.  No murmur, or thrill. Apical beat not displaced Peripheral pulses normal.  Abdomen: Soft, non tender, no organomegaly or masses. No bruits. Bowel sounds normal. No guarding, tenderness or rebound.    Musculoskeletal exam: Full ROM of spine, hips , shoulders and knees. No deformity ,swelling or crepitus noted. No muscle wasting or atrophy.   Neurologic: Cranial nerves 2 to 12 intact. Power, tone ,sensation and reflexes normal throughout. No disturbance in gait. No tremor.  Skin: Intact, no ulceration, erythema , scaling or rash noted. Pigmentation normal throughout  Psych; Normal mood and affect. Judgement and concentration normal   Assessment & Plan:  Annual visit for general adult medical examination with abnormal findings Annual exam as documented. Counseling done  re healthy lifestyle involving commitment to 150 minutes exercise per week, heart healthy diet, and attaining healthy weight.The importance of adequate sleep also discussed. Regular seat belt use and home  safety, is also discussed. Changes in health habits are decided on by the patient with goals and time frames  set for achieving them. Immunization and cancer screening needs are specifically addressed at this visit.   Allergic rhinitis Commit to daily montelukast and short course of prednisone prescribed  Sore throat z pack prescribed

## 2020-10-27 NOTE — Patient Instructions (Signed)
F/U in office with MD early March, call if you need me sooner   Medication prescribed (3) for uncontrolled allergies and sore throat.  Please get fasting lipid, cmp and eGFr and Vit D 5 days  Before follow up  Please arrange cologuard test for patient  It is important that you exercise regularly at least 30 minutes 5 times a week. If you develop chest pain, have severe difficulty breathing, or feel very tired, stop exercising immediately and seek medical attention  Think about what you will eat, plan ahead. Choose " clean, green, fresh or frozen" over canned, processed or packaged foods which are more sugary, salty and fatty. 70 to 75% of food eaten should be vegetables and fruit. Three meals at set times with snacks allowed between meals, but they must be fruit or vegetables. Aim to eat over a 12 hour period , example 7 am to 7 pm, and STOP after  your last meal of the day. Drink water,generally about 64 ounces per day, no other drink is as healthy. Fruit juice is best enjoyed in a healthy way, by EATING the fruit. Thanks for choosing [facility], we consider it a privelige to serve you.

## 2020-10-28 NOTE — Telephone Encounter (Signed)
Ladona Ridgel advised me to change order to CU-64 DetectNet Scan. CPT code 22575, A Code: [id number]. PA will need to be obtained prior to scheduling.   PA for PET scan submitted via [facility] website. Case was sent to Medical Review. Case# [id number]. Per [facility] website [phone] doesn't require PA.

## 2020-10-28 NOTE — Addendum Note (Signed)
Addended by: Sandria Senter C on: 10/28/2020 10:30 AM   Modules accepted: Orders

## 2020-10-29 NOTE — Telephone Encounter (Signed)
Received fax from [facility] requesting clinical notes for PET scan PA. Reference# [id number].  Clinical notes faxed to [phone].

## 2020-11-03 NOTE — Telephone Encounter (Signed)
Received fax from [facility], PET scan approved. PA# [id number], valid 11/02/20-12/17/20. Informed scheduler.

## 2020-11-05 DIAGNOSIS — Z1211 Encounter for screening for malignant neoplasm of colon: Secondary | ICD-10-CM | POA: Diagnosis not present

## 2020-11-05 LAB — COLOGUARD: Cologuard: NEGATIVE

## 2020-11-13 ENCOUNTER — Ambulatory Visit (HOSPITAL_COMMUNITY)
Admission: RE | Admit: 2020-11-13 | Discharge: 2020-11-13 | Disposition: A | Discharge status: Home / Self Care | Payer: Medicare Other | Source: Ambulatory Visit | Attending: Gastroenterology | Admitting: Gastroenterology

## 2020-11-13 ENCOUNTER — Other Ambulatory Visit: Payer: Self-pay

## 2020-11-13 DIAGNOSIS — K7689 Other specified diseases of liver: Secondary | ICD-10-CM | POA: Diagnosis not present

## 2020-11-13 DIAGNOSIS — R7989 Other specified abnormal findings of blood chemistry: Secondary | ICD-10-CM | POA: Diagnosis not present

## 2020-11-13 DIAGNOSIS — R232 Flushing: Secondary | ICD-10-CM | POA: Diagnosis not present

## 2020-11-13 IMAGING — CT NM PET SKULL BASE TO THIGH
7 series · 25 of 25 positions shown · non-contrast
Comparison: CT [DATE]

CLINICAL DATA: Elevated 5 HIAA with is flushing.

EXAM:
NUCLEAR MEDICINE PET SKULL BASE TO THIGH
TECHNIQUE: 4.3 mCi SAMEERA 64 DOTATATE was injected intravenously. Full-ring PET
imaging was performed from the skull base to thigh after the
radiotracer. CT data was obtained and used for attenuation
correction and anatomic localization.

[Series 3: pet sk_thigh ac · axial · 5.0mm · 4.07mm/px · z∈[-1015,-143]mm · 6 of 219 slices shown]
[im 1/219]
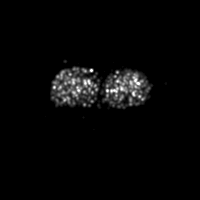
[im 44/219]
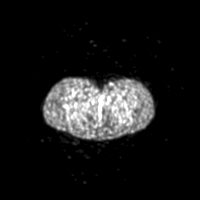
[im 88/219]
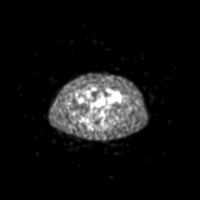
[im 131/219]
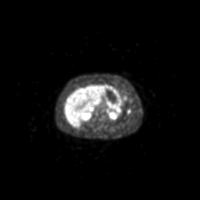
[im 175/219]
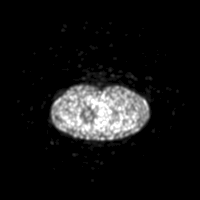
[im 219/219]
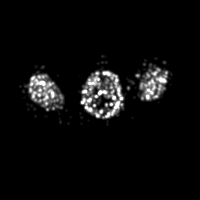

[Series 4: ct sk_thigh 5.0 bf37 · axial · 5.0mm · 0.98mm/px · z∈[-1015,-143]mm · 5 of 219 slices shown]
[im 1/219]
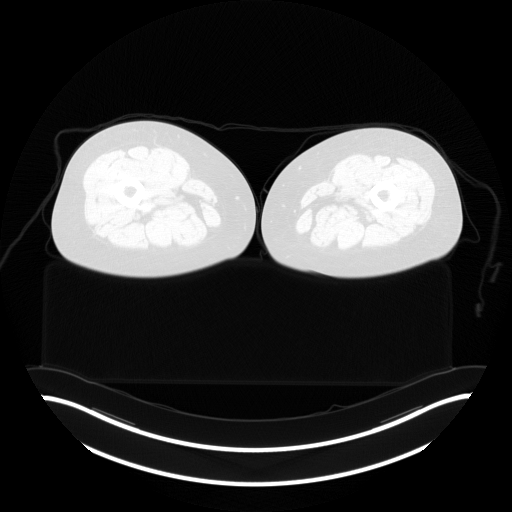
[im 55/219]
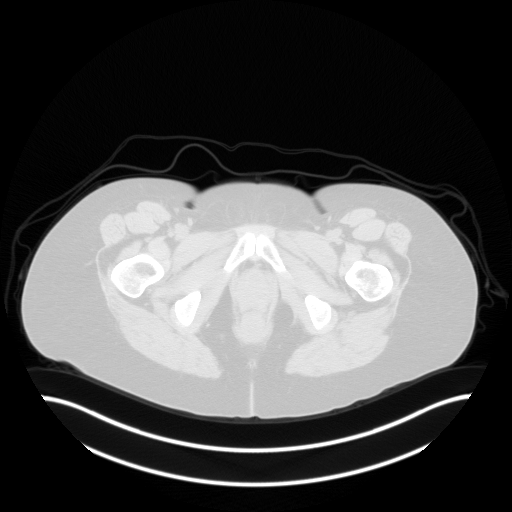
[im 110/219]
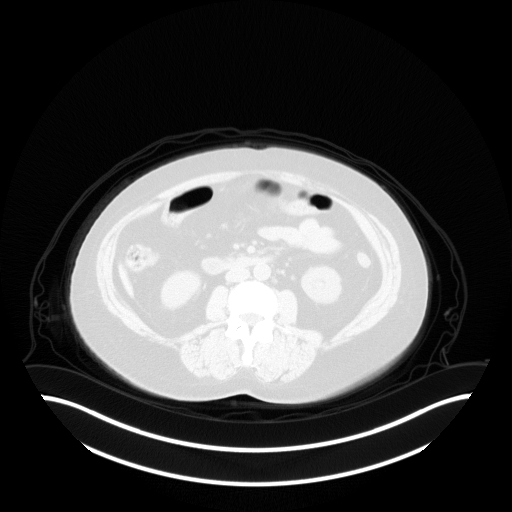
[im 164/219]
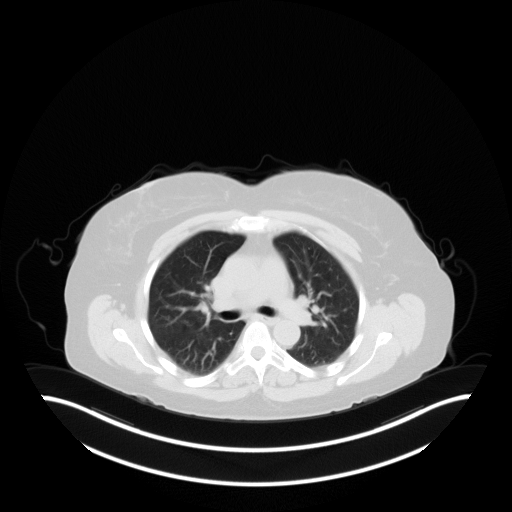
[im 219/219  brain]
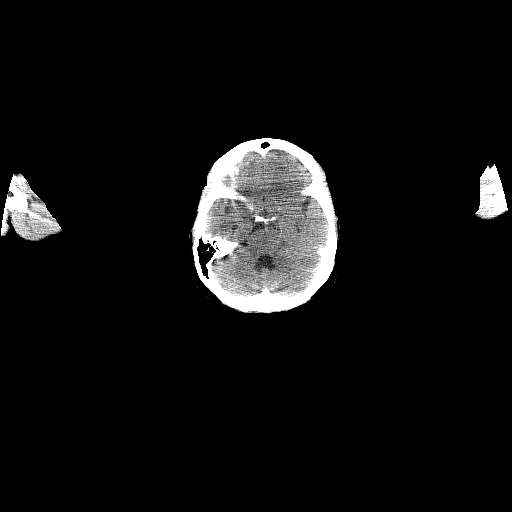

[Series 5: pet sk_thigh nac · axial · 5.0mm · 4.07mm/px · z∈[-1015,-143]mm · 5 of 219 slices shown]
[im 1/219]
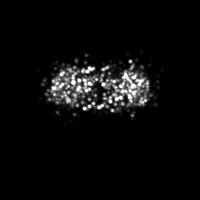
[im 55/219]
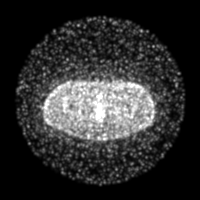
[im 110/219]
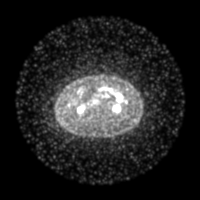
[im 164/219]
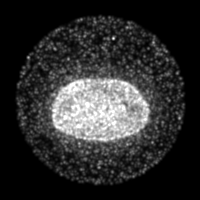
[im 219/219]
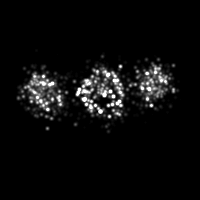

[Series 8: ct sk_thigh 5.0 br59 lung_bone · axial · 5.0mm · 0.73mm/px · z∈[-520,-268]mm · 2 of 64 slices shown]
[im 1/64]
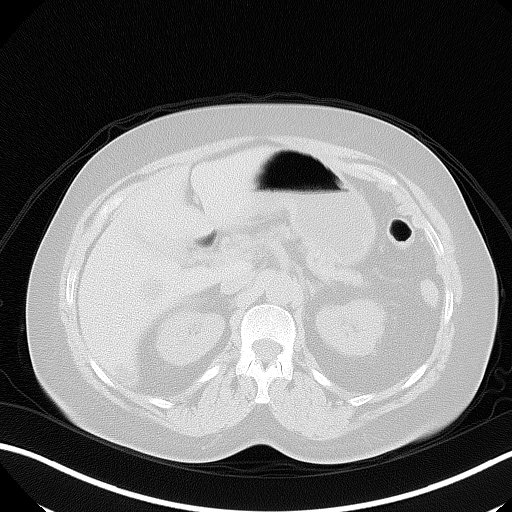
[im 64/64]
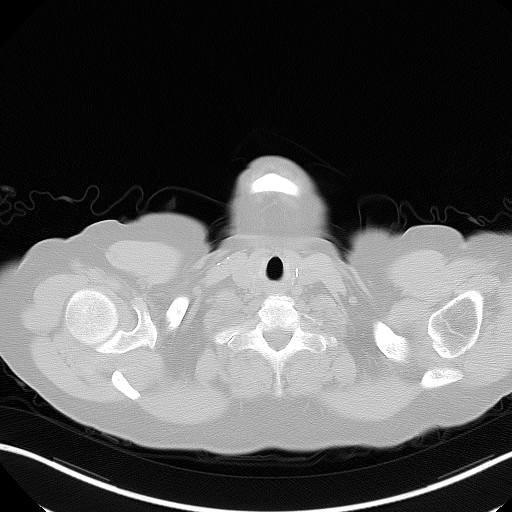

[Series 603: <mip collection> · coronal · 1.81mm/px · 1 of 32 slices shown]
[im 1/32]
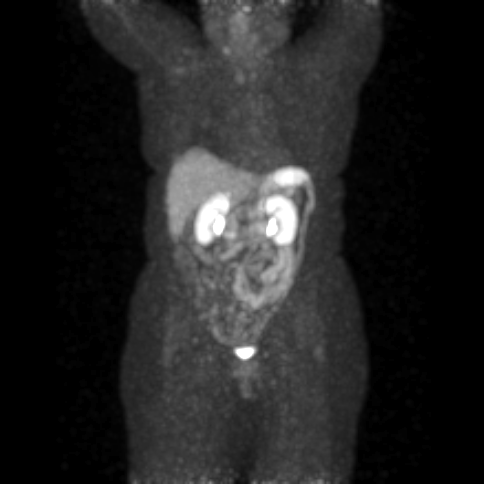

[Series 604: fused cor · 1 of 54 slices shown]
[im 1/54]
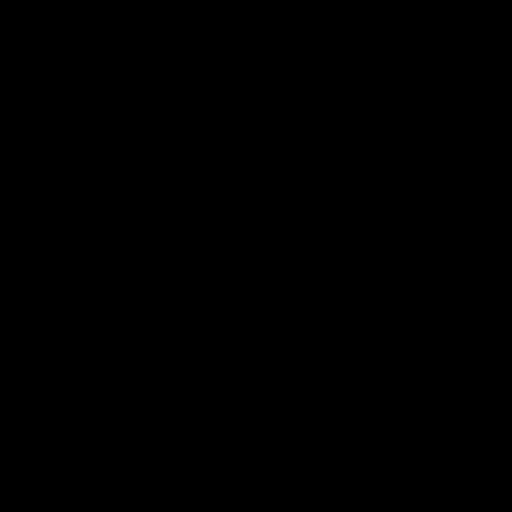

[Series 605: range-ct sk_thigh 5.0 bf37-tra-<alpha range> · 5 of 204 slices shown]
[im 1/204]
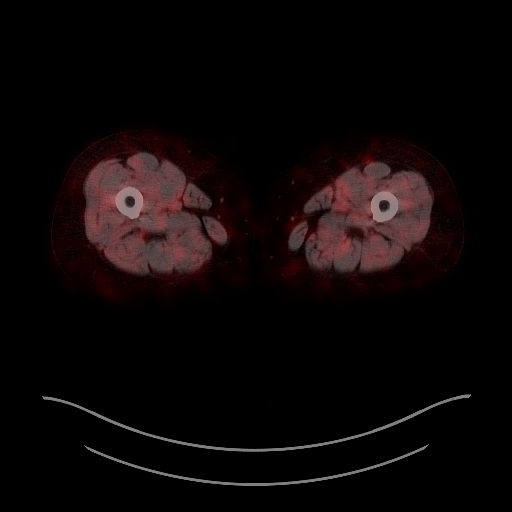
[im 51/204]
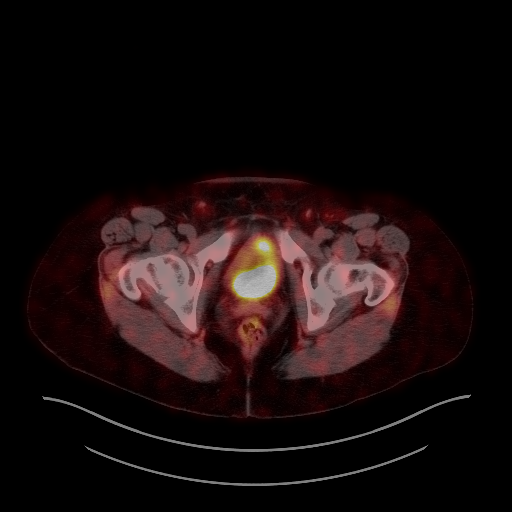
[im 102/204]
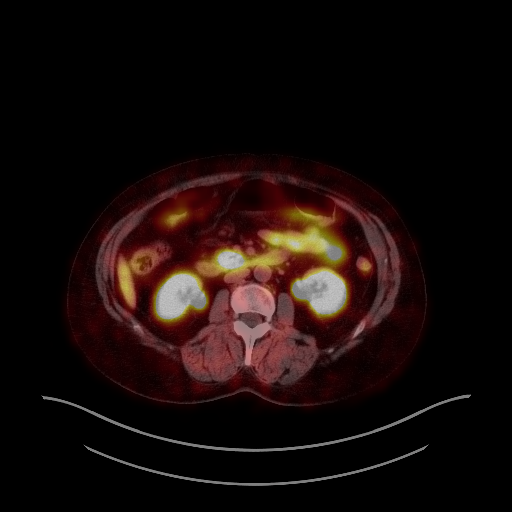
[im 153/204]
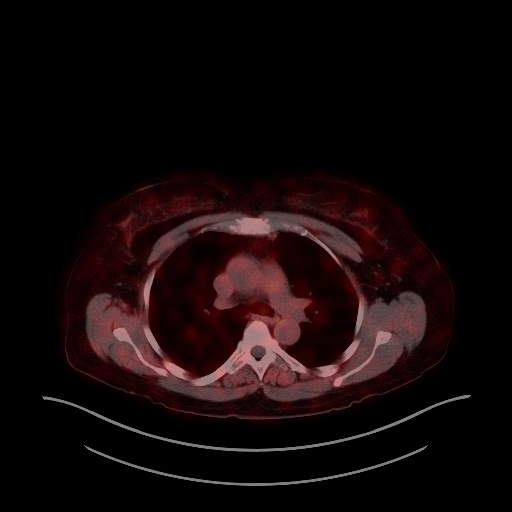
[im 204/204]
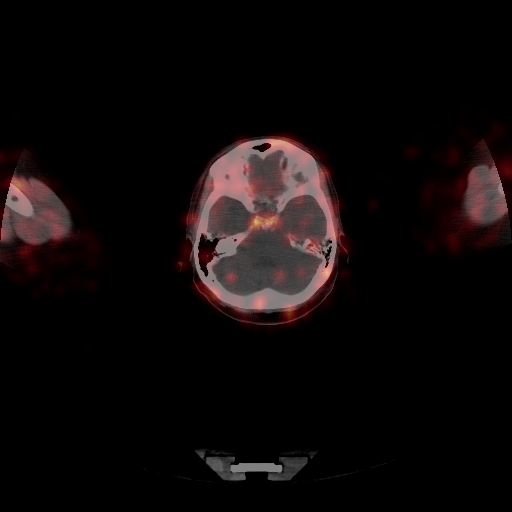

[25 of 25 positions shown; findings below may reference images not displayed]

FINDINGS: NECK

No radiotracer activity in neck lymph nodes.

Incidental CT findings: LEFT hemithyroidectomy.

CHEST

No radiotracer accumulation within mediastinal or hilar lymph nodes.
No suspicious pulmonary nodules on the CT scan.

Incidental CT finding:None

ABDOMEN/PELVIS

No abnormal radiotracer within the mesentery. No abnormal
radiotracer within the pancreas. No abnormal radiotracer within the
bowel.

No liver metastasis.

No bowel obstruction or bowel lesion identified.

Physiologic activity noted in the liver, spleen, adrenal glands and
kidneys.

Incidental CT findings:Benign hepatic cysts.

SKELETON

No focal activity to suggest skeletal metastasis.

Incidental CT findings:None
IMPRESSION: 1. No evidence of well differentiated neuroendocrine tumor within
the abdomen pelvis. No evidence of metastatic disease.
2. Benign hepatic cysts.

## 2020-11-13 MED ORDER — COPPER CU 64 DOTATATE 1 MCI/ML IV SOLN
4.0000 | Freq: Once | INTRAVENOUS | Status: DC
  Administered 2020-11-13: 4.4 via INTRAVENOUS

## 2020-11-18 LAB — COLOGUARD: COLOGUARD: NEGATIVE

## 2020-11-21 ENCOUNTER — Other Ambulatory Visit: Payer: Self-pay | Admitting: Family Medicine

## 2020-11-23 ENCOUNTER — Encounter: Payer: Self-pay | Admitting: Internal Medicine

## 2020-11-23 DIAGNOSIS — M47816 Spondylosis without myelopathy or radiculopathy, lumbar region: Secondary | ICD-10-CM | POA: Diagnosis not present

## 2020-12-15 ENCOUNTER — Other Ambulatory Visit: Payer: Self-pay | Admitting: Family Medicine

## 2020-12-20 ENCOUNTER — Other Ambulatory Visit: Payer: Self-pay | Admitting: Family Medicine

## 2020-12-28 DIAGNOSIS — M47816 Spondylosis without myelopathy or radiculopathy, lumbar region: Secondary | ICD-10-CM | POA: Diagnosis not present

## 2020-12-31 ENCOUNTER — Telehealth: Payer: Self-pay | Admitting: Internal Medicine

## 2020-12-31 NOTE — Telephone Encounter (Signed)
Pt called to see if Dr Marletta Lor would send her prescription of Omeprazole to [facility] on [address] and for it to be 40 pills instead of 180 because it's cheaper.

## 2020-12-31 NOTE — Telephone Encounter (Signed)
Dr. Marletta Lor  Pt wants a different Rx written out and sent to [facility] on [address] for 40 pills instead of 180 pills. Since this will be a new Rx I am sending it to you. Pt states it is cheaper this way.

## 2021-01-04 DIAGNOSIS — I10 Essential (primary) hypertension: Secondary | ICD-10-CM | POA: Diagnosis not present

## 2021-01-04 DIAGNOSIS — E785 Hyperlipidemia, unspecified: Secondary | ICD-10-CM | POA: Diagnosis not present

## 2021-01-04 DIAGNOSIS — E559 Vitamin D deficiency, unspecified: Secondary | ICD-10-CM | POA: Diagnosis not present

## 2021-01-05 ENCOUNTER — Telehealth: Payer: Medicare Other | Admitting: Family Medicine

## 2021-01-05 LAB — CMP14+EGFR
ALT: 17 IU/L (ref 0–32)
AST: 24 IU/L (ref 0–40)
Albumin/Globulin Ratio: 1.7 (ref 1.2–2.2)
Albumin: 4.3 g/dL (ref 3.8–4.8)
Alkaline Phosphatase: 72 IU/L (ref 44–121)
BUN/Creatinine Ratio: 12 (ref 12–28)
BUN: 11 mg/dL (ref 8–27)
Bilirubin Total: 0.3 mg/dL (ref 0.0–1.2)
CO2: 24 mmol/L (ref 20–29)
Calcium: 9.3 mg/dL (ref 8.7–10.3)
Chloride: 97 mmol/L (ref 96–106)
Creatinine, Ser: 0.92 mg/dL (ref 0.57–1.00)
Globulin, Total: 2.6 g/dL (ref 1.5–4.5)
Glucose: 93 mg/dL (ref 65–99)
Potassium: 3.2 mmol/L — ABNORMAL LOW (ref 3.5–5.2)
Sodium: 137 mmol/L (ref 134–144)
Total Protein: 6.9 g/dL (ref 6.0–8.5)
eGFR: 67 mL/min/{1.73_m2} (ref >59)

## 2021-01-05 LAB — LIPID PANEL
Chol/HDL Ratio: 4.3 ratio (ref 0.0–4.4)
Cholesterol, Total: 251 mg/dL — ABNORMAL HIGH (ref 100–199)
HDL: 58 mg/dL (ref >39)
LDL Chol Calc (NIH): 174 mg/dL — ABNORMAL HIGH (ref 0–99)
Triglycerides: 106 mg/dL (ref 0–149)
VLDL Cholesterol Cal: 19 mg/dL (ref 5–40)

## 2021-01-05 LAB — VITAMIN D 25 HYDROXY (VIT D DEFICIENCY, FRACTURES): Vit D, 25-Hydroxy: 63 ng/mL (ref 30.0–100.0)

## 2021-01-07 ENCOUNTER — Other Ambulatory Visit: Payer: Self-pay

## 2021-01-07 ENCOUNTER — Ambulatory Visit: Payer: Medicare Other | Admitting: Family Medicine

## 2021-01-07 ENCOUNTER — Telehealth (INDEPENDENT_AMBULATORY_CARE_PROVIDER_SITE_OTHER): Payer: Medicare Other | Admitting: Family Medicine

## 2021-01-07 ENCOUNTER — Encounter: Payer: Self-pay | Admitting: Family Medicine

## 2021-01-07 VITALS — Ht 63.0 in | Wt 193.0 lb

## 2021-01-07 DIAGNOSIS — F411 Generalized anxiety disorder: Secondary | ICD-10-CM

## 2021-01-07 DIAGNOSIS — E890 Postprocedural hypothyroidism: Secondary | ICD-10-CM

## 2021-01-07 DIAGNOSIS — K219 Gastro-esophageal reflux disease without esophagitis: Secondary | ICD-10-CM

## 2021-01-07 DIAGNOSIS — R630 Anorexia: Secondary | ICD-10-CM

## 2021-01-07 DIAGNOSIS — R634 Abnormal weight loss: Secondary | ICD-10-CM

## 2021-01-07 DIAGNOSIS — D649 Anemia, unspecified: Secondary | ICD-10-CM

## 2021-01-07 DIAGNOSIS — I10 Essential (primary) hypertension: Secondary | ICD-10-CM

## 2021-01-07 DIAGNOSIS — E669 Obesity, unspecified: Secondary | ICD-10-CM | POA: Diagnosis not present

## 2021-01-07 DIAGNOSIS — E785 Hyperlipidemia, unspecified: Secondary | ICD-10-CM | POA: Diagnosis not present

## 2021-01-07 DIAGNOSIS — F322 Major depressive disorder, single episode, severe without psychotic features: Secondary | ICD-10-CM

## 2021-01-07 MED ORDER — POTASSIUM CHLORIDE CRYS ER 20 MEQ PO TBCR: EXTENDED_RELEASE_TABLET | ORAL | 4 refills | Status: DC

## 2021-01-07 NOTE — Progress Notes (Signed)
Virtual Visit via Telephone Note  I connected with Laura [name] on 01/07/21 at  2:20 PM EST by telephone and verified that I am speaking with the correct person using two identifiers.  Location: Patient: home Provider: office   I discussed the limitations, risks, security and privacy concerns of performing an evaluation and management service by telephone and the availability of in person appointments. I also discussed with the patient that there may be a patient responsible charge related to this service. The patient expressed understanding and agreed to proceed.   History of Present Illness: F/U chronic problems and address any new or current concerns. Review and update medications and allergies. Review recent lab and radiologic data . Update routine health maintainace. Review an encourage improved health habits to include nutrition, exercise and  sleep . C/o weight gain due to Covid , overeating high calorie foods and staying at home  2 day h/o loose watery stool brown , 4 BM total, no mucus or blood, will get immpoium, no vomit, thinks she was exposed from her son who is sick Denies recent fever or chills. Denies sinus pressure, nasal congestion, ear pain or sore throat. Denies chest congestion, productive cough or wheezing. Denies chest pains, palpitations and leg swelling Denies abdominal pain, nausea, vomiting, or constipation.   Denies dysuria, frequency, hesitancy C/o chronic joint pain, stiffness and denies  limitation in mobility. Denies headaches, seizures, numbness, or tingling. Denies uncontrolled  depression, anxiety or insomnia. Denies skin break down or rash.       Observations/Objective: Ht 5\' 3"  (1.6 m)   Wt 193 lb (87.5 kg)   BMI 34.19 kg/m  Good communication with no confusion and intact memory. Alert and oriented x 3 No signs of respiratory distress during speech    Assessment and Plan: Essential hypertension DASH diet and commitment to daily  physical activity for a minimum of 30 minutes discussed and encouraged, as a part of hypertension management. The importance of attaining a healthy weight is also discussed.  BP/Weight 01/07/2021 10/27/2020 10/19/2020 09/10/2020 09/04/2020 08/25/2020 08/13/2020  Systolic BP - 132 128 140 136 08/15/2020 139  Diastolic BP - 84 84 80 85 78 89  Wt. (Lbs) 193 182.12 184.8 179.4 176 171 177  BMI 34.19 32.26 32.74 31.78 31.18 30.29 31.35     Controlled, no change in medication   Hyperlipidemia LDL goal <100 Hyperlipidemia:Low fat diet discussed and encouraged.   Lipid Panel  Lab Results  Component Value Date   CHOL 251 (H) 01/04/2021   HDL 58 01/04/2021   LDLCALC 174 (H) 01/04/2021   TRIG 106 01/04/2021   CHOLHDL 4.3 01/04/2021     Not at goal , med adjustment needed  Obesity (BMI 30.0-34.9)  Patient re-educated about  the importance of commitment to a  minimum of 150 minutes of exercise per week as able.  The importance of healthy food choices with portion control discussed, as well as eating regularly and within a 12 hour window most days. The need to choose "clean , green" food 50 to 75% of the time is discussed, as well as to make water the primary drink and set a goal of 64 ounces water daily.    Weight /BMI 01/07/2021 10/27/2020 10/19/2020  WEIGHT 193 lb 182 lb 1.9 oz 184 lb 12.8 oz  HEIGHT 5\' 3"  5\' 3"  5\' 3"   BMI 34.19 kg/m2 32.26 kg/m2 32.74 kg/m2      Depression, major, single episode, severe (HCC) Controlled, no change in medication  GAD (generalized anxiety disorder) Controlled, no change in medication   Gastroesophageal reflux disease Controlled, no change in medication    Follow Up Instructions:    I discussed the assessment and treatment plan with the patient. The patient was provided an opportunity to ask questions and all were answered. The patient agreed with the plan and demonstrated an understanding of the instructions.   The patient was advised to  call back or seek an in-person evaluation if the symptoms worsen or if the condition fails to improve as anticipated.  I provided 18 minutes of non-face-to-face time during this encounter.   Syliva Overman, MD

## 2021-01-07 NOTE — Patient Instructions (Addendum)
F/U in office early September, with MD, flu vaccine at visit, call if you need me sooner  Please work on changing food choice, cholesterol and weight are too high, increase vegetables and fruit, fresh or frozen   Get imodium to help with loose stool  Increase potassium to 3 daily  Please get fasting lipid, cmp and EGF and CBC end August  It is important that you exercise regularly at least 30 minutes 5 times a week. If you develop chest pain, have severe difficulty breathing, or feel very tired, stop exercising immediately and seek medical attention    Fat and Cholesterol Restricted Eating Plan Getting too much fat and cholesterol in your diet may cause health problems. Choosing the right foods helps keep your fat and cholesterol at normal levels. This can keep you from getting certain diseases. Your doctor may recommend an eating plan that includes:  Total fat: ______% or less of total calories a day.  Saturated fat: ______% or less of total calories a day.  Cholesterol: less than _________mg a day.  Fiber: ______g a day. What are tips for following this plan? Meal planning  At meals, divide your plate into four equal parts: ? Fill one-half of your plate with vegetables and green salads. ? Fill one-fourth of your plate with whole grains. ? Fill one-fourth of your plate with low-fat (lean) protein foods.  Eat fish that is high in omega-3 fats at least two times a week. This includes mackerel, tuna, sardines, and salmon.  Eat foods that are high in fiber, such as whole grains, beans, apples, broccoli, carrots, peas, and barley. General tips  Work with your doctor to lose weight if you need to.  Avoid: ? Foods with added sugar. ? Fried foods. ? Foods with partially hydrogenated oils.  Limit alcohol intake to no more than 1 drink a day for nonpregnant women and 2 drinks a day for men. One drink equals 12 oz of beer, 5 oz of wine, or 1 oz of hard liquor.   Reading food  labels  Check food labels for: ? Trans fats. ? Partially hydrogenated oils. ? Saturated fat (g) in each serving. ? Cholesterol (mg) in each serving. ? Fiber (g) in each serving.  Choose foods with healthy fats, such as: ? Monounsaturated fats. ? Polyunsaturated fats. ? Omega-3 fats.  Choose grain products that have whole grains. Look for the word "whole" as the first word in the ingredient list. Cooking  Cook foods using low-fat methods. These include baking, boiling, grilling, and broiling.  Eat more home-cooked foods. Eat at restaurants and buffets less often.  Avoid cooking using saturated fats, such as butter, cream, palm oil, palm kernel oil, and coconut oil. Recommended foods Fruits  All fresh, canned (in natural juice), or frozen fruits. Vegetables  Fresh or frozen vegetables (raw, steamed, roasted, or grilled). Green salads. Grains  Whole grains, such as whole wheat or whole grain breads, crackers, cereals, and pasta. Unsweetened oatmeal, bulgur, barley, quinoa, or brown rice. Corn or whole wheat flour tortillas. Meats and other protein foods  Ground beef (85% or leaner), grass-fed beef, or beef trimmed of fat. Skinless chicken or [place]. Ground chicken or [place]. Pork trimmed of fat. All fish and seafood. Egg whites. Dried beans, peas, or lentils. Unsalted nuts or seeds. Unsalted canned beans. Nut butters without added sugar or oil. Dairy  Low-fat or nonfat dairy products, such as skim or 1% milk, 2% or reduced-fat cheeses, low-fat and fat-free ricotta or cottage cheese,  or plain low-fat and nonfat yogurt. Fats and oils  Tub margarine without trans fats. Light or reduced-fat mayonnaise and salad dressings. Avocado. Olive, canola, sesame, or safflower oils. The items listed above may not be a complete list of foods and beverages you can eat. Contact a dietitian for more information.   Foods to avoid Fruits  Canned fruit in heavy syrup. Fruit in cream or butter  sauce. Fried fruit. Vegetables  Vegetables cooked in cheese, cream, or butter sauce. Fried vegetables. Grains  White bread. White pasta. White rice. Cornbread. Bagels, pastries, and croissants. Crackers and snack foods that contain trans fat and hydrogenated oils. Meats and other protein foods  Fatty cuts of meat. Ribs, chicken wings, bacon, sausage, bologna, salami, chitterlings, fatback, hot dogs, bratwurst, and packaged lunch meats. Liver and organ meats. Whole eggs and egg yolks. Chicken and [place] with skin. Fried meat. Dairy  Whole or 2% milk, cream, half-and-half, and cream cheese. Whole milk cheeses. Whole-fat or sweetened yogurt. Full-fat cheeses. Nondairy creamers and whipped toppings. Processed cheese, cheese spreads, and cheese curds. Beverages  Alcohol. Sugar-sweetened drinks such as sodas, lemonade, and fruit drinks. Fats and oils  Butter, stick margarine, lard, shortening, ghee, or bacon fat. Coconut, palm kernel, and palm oils. Sweets and desserts  Corn syrup, sugars, honey, and molasses. Candy. Jam and jelly. Syrup. Sweetened cereals. Cookies, pies, cakes, donuts, muffins, and ice cream. The items listed above may not be a complete list of foods and beverages you should avoid. Contact a dietitian for more information. Summary  Choosing the right foods helps keep your fat and cholesterol at normal levels. This can keep you from getting certain diseases.  At meals, fill one-half of your plate with vegetables and green salads.  Eat high-fiber foods, like whole grains, beans, apples, carrots, peas, and barley.  Limit added sugar, saturated fats, alcohol, and fried foods. This information is not intended to replace advice given to you by your health care provider. Make sure you discuss any questions you have with your health care provider. Document Revised: 02/19/2020 Document Reviewed: 02/19/2020 Elsevier Patient Education  2021 [employer].

## 2021-01-07 NOTE — Progress Notes (Signed)
Lab orders sent to [facility]

## 2021-01-08 ENCOUNTER — Ambulatory Visit: Payer: Medicare Other | Admitting: "Endocrinology

## 2021-01-08 LAB — T4, FREE: Free T4: 1.46 ng/dL (ref 0.82–1.77)

## 2021-01-08 LAB — TSH: TSH: 0.522 u[IU]/mL (ref 0.450–4.500)

## 2021-01-09 ENCOUNTER — Encounter: Payer: Self-pay | Admitting: Family Medicine

## 2021-01-09 NOTE — Assessment & Plan Note (Signed)
Controlled, no change in medication  

## 2021-01-09 NOTE — Assessment & Plan Note (Signed)
DASH diet and commitment to daily physical activity for a minimum of 30 minutes discussed and encouraged, as a part of hypertension management. The importance of attaining a healthy weight is also discussed.  BP/Weight 01/07/2021 10/27/2020 10/19/2020 09/10/2020 09/04/2020 08/25/2020 08/13/2020  Systolic BP - 132 128 140 136 863 139  Diastolic BP - 84 84 80 85 78 89  Wt. (Lbs) 193 182.12 184.8 179.4 176 171 177  BMI 34.19 32.26 32.74 31.78 31.18 30.29 31.35     Controlled, no change in medication

## 2021-01-09 NOTE — Assessment & Plan Note (Addendum)
Hyperlipidemia:Low fat diet discussed and encouraged.   Lipid Panel  Lab Results  Component Value Date   CHOL 251 (H) 01/04/2021   HDL 58 01/04/2021   LDLCALC 174 (H) 01/04/2021   TRIG 106 01/04/2021   CHOLHDL 4.3 01/04/2021     Not at goal , med adjustment needed

## 2021-01-09 NOTE — Assessment & Plan Note (Signed)
  Patient re-educated about  the importance of commitment to a  minimum of 150 minutes of exercise per week as able.  The importance of healthy food choices with portion control discussed, as well as eating regularly and within a 12 hour window most days. The need to choose "clean , green" food 50 to 75% of the time is discussed, as well as to make water the primary drink and set a goal of 64 ounces water daily.    Weight /BMI 01/07/2021 10/27/2020 10/19/2020  WEIGHT 193 lb 182 lb 1.9 oz 184 lb 12.8 oz  HEIGHT 5\' 3"  5\' 3"  5\' 3"   BMI 34.19 kg/m2 32.26 kg/m2 32.74 kg/m2

## 2021-01-11 ENCOUNTER — Telehealth: Payer: Self-pay | Admitting: Internal Medicine

## 2021-01-11 MED ORDER — OMEPRAZOLE 20 MG PO CPDR: 20.0000 mg | DELAYED_RELEASE_CAPSULE | Freq: Two times a day (BID) | ORAL | 5 refills | Status: DC

## 2021-01-11 NOTE — Telephone Encounter (Signed)
noted 

## 2021-01-11 NOTE — Telephone Encounter (Signed)
Phoned the pt and LM on her vm that Dr. Marletta Lor sent in Rx to her pharmacy.

## 2021-01-11 NOTE — Telephone Encounter (Signed)
New script sent to pharmacy

## 2021-01-12 ENCOUNTER — Ambulatory Visit: Payer: Medicare Other | Admitting: "Endocrinology

## 2021-01-12 ENCOUNTER — Encounter: Payer: Self-pay | Admitting: "Endocrinology

## 2021-01-12 ENCOUNTER — Other Ambulatory Visit: Payer: Self-pay

## 2021-01-12 VITALS — BP 142/83 | HR 72 | Ht 63.0 in | Wt 190.8 lb

## 2021-01-12 DIAGNOSIS — E890 Postprocedural hypothyroidism: Secondary | ICD-10-CM | POA: Diagnosis not present

## 2021-01-12 LAB — POCT GLYCOSYLATED HEMOGLOBIN (HGB A1C): Hemoglobin A1C: 5.4 % (ref 4.0–5.6)

## 2021-01-12 NOTE — Progress Notes (Signed)
01/12/2021  Endocrinology follow-up note  Complaint: Follow-up for hypothyroidism  HPI  Laura [name] is a 70 y.o.-year-old female,  She has  medical history of goiter status post left hemithyroidectomy 20+ years ago, unremarkable thyroid/neck ultrasound in 2012.  She is currently on levothyroxine 112 mcg p.o. daily before breakfast.  She is compliant to her medications.  She has a steady body weight.   -  She denies any heat/cold intolerance, palpitations, tremors.    Pt denies feeling nodules in neck, hoarseness, dysphagia/odynophagia, SOB with lying down.  No history of radiation exposure to her head and neck.   No recent use of iodine supplements.   ROS: Constitutional: + Progressive weight gain, no fatigue, no subjective hyperthermia nor hypothermia.     PE: BP (!) 142/83   Pulse 72   Ht _0  (1.6 m)   Wt 190 lb 12.8 oz (86.5 kg)   BMI 33.80 kg/m  Wt Readings from Last 3 Encounters:  01/12/21 190 lb 12.8 oz (86.5 kg)  01/07/21 193 lb (87.5 kg)  10/27/20 182 lb 1.9 oz (82.6 kg)    Physical Exam- Limited  Constitutional:  Body mass index is 33.8 kg/m. , not in acute distress, normal state of mind    Recent Results (from the past 2160 hour(s))  Lipid panel     Status: Abnormal   Collection Time: 01/04/21  9:48 AM  Result Value Ref Range   Cholesterol, Total 251 (H) 100 - 199 mg/dL   Triglycerides 106 0 - 149 mg/dL   HDL 58 >39 mg/dL   VLDL Cholesterol Cal 19 5 - 40 mg/dL   LDL Chol Calc (NIH) 174 (H) 0 - 99 mg/dL   Chol/HDL Ratio 4.3 0.0 - 4.4 ratio    Comment:                                   T. Chol/HDL Ratio                                             Men  Women                               1/2 Avg.Risk  3.4    3.3                                   Avg.Risk  5.0    4.4                                2X Avg.Risk  9.6    7.1                                3X Avg.Risk 23.4   11.0   CMP14+EGFR     Status: Abnormal   Collection Time: 01/04/21  9:48 AM   Result Value Ref Range   Glucose 93 65 - 99 mg/dL   BUN 11 8 - 27 mg/dL   Creatinine, Ser 0.92 0.57 - 1.00 mg/dL   eGFR 67 >59 mL/min/1.73  Comment: **In accordance with recommendations from the NKF-ASN Task force,**   [facility] has updated its eGFR calculation to the 2021 CKD-EPI   creatinine equation that estimates kidney function without a race   variable.    BUN/Creatinine Ratio 12 12 - 28   Sodium 137 134 - 144 mmol/L   Potassium 3.2 (L) 3.5 - 5.2 mmol/L   Chloride 97 96 - 106 mmol/L   CO2 24 20 - 29 mmol/L   Calcium 9.3 8.7 - 10.3 mg/dL   Total Protein 6.9 6.0 - 8.5 g/dL   Albumin 4.3 3.8 - 4.8 g/dL   Globulin, Total 2.6 1.5 - 4.5 g/dL   Albumin/Globulin Ratio 1.7 1.2 - 2.2   Bilirubin Total 0.3 0.0 - 1.2 mg/dL   Alkaline Phosphatase 72 44 - 121 IU/L   AST 24 0 - 40 IU/L   ALT 17 0 - 32 IU/L  VITAMIN D 25 Hydroxy (Vit-D Deficiency, Fractures)     Status: None   Collection Time: 01/04/21  9:48 AM  Result Value Ref Range   Vit D, 25-Hydroxy 63.0 30.0 - 100.0 ng/mL    Comment: Vitamin D deficiency has been defined by the Institute of Medicine and an Endocrine Society practice guideline as a level of serum 25-OH vitamin D less than 20 ng/mL (1,2). The Endocrine Society went on to further define vitamin D insufficiency as a level between 21 and 29 ng/mL (2). 1. IOM (Institute of Medicine). 2010. Dietary reference    intakes for calcium and D. [place]: The    [employer]. 2. Holick MF, Binkley [place], Bischoff-Ferrari HA, et al.    Evaluation, treatment, and prevention of vitamin D    deficiency: an Endocrine Society clinical practice    guideline. JCEM. 2011 Jul; 96(7):1911-30.   T4, Free     Status: None   Collection Time: 01/07/21 11:04 AM  Result Value Ref Range   Free T4 1.46 0.82 - 1.77 ng/dL  TSH     Status: None   Collection Time: 01/07/21 11:04 AM  Result Value Ref Range   TSH 0.522 0.450 - 4.500 uIU/mL  HgB A1c     Status: None   Collection  Time: 01/12/21 10:01 AM  Result Value Ref Range   Hemoglobin A1C 5.4 4.0 - 5.6 %   HbA1c POC (<> result, manual entry)     HbA1c, POC (prediabetic range)     HbA1c, POC (controlled diabetic range)     ASSESSMENT: 1. Hypothyroidism, postsurgical  PLAN:  -Her previsit thyroid function tests are consistent with appropriate replacement.  She is advised to continue levothyroxine 112 mcg p.o. daily before breakfast.    - We discussed about the correct intake of her thyroid hormone, on empty stomach at fasting, with water, separated by at least 30 minutes from breakfast and other medications,  and separated by more than 4 hours from calcium, iron, multivitamins, acid reflux medications (PPIs). -Patient is made aware of the fact that thyroid hormone replacement is needed for life, dose to be adjusted by periodic monitoring of thyroid function tests. -Her surveillance thyroid/neck ultrasound was unremarkable in 2012.  Her point-of-care A1c is 5.4%, improving from prior measurements consistent with prediabetes. - she acknowledges that there is a room for improvement in her food and drink choices. - Suggestion is made for her to avoid simple carbohydrates  from her diet including Cakes, Sweet Desserts, Ice Cream, Soda (diet and regular), Sweet Tea, Candies, Chips, Cookies, Store Bought Juices, Alcohol in  Excess of  1-2 drinks a day, Artificial Sweeteners,  Coffee Creamer, and "Sugar-free" Products, Lemonade. This will help patient to have more stable blood glucose profile and potentially avoid unintended weight gain.   -Her screening bone density from October 2019 was normal.    She is advised to maintain close follow up with her PMD for Primary care needs.     - Time spent on this patient care encounter:  30 minutes of which 50% was spent in  counseling and the rest reviewing  her current and  previous labs / studies and medications  doses and developing a plan for long term care, and documenting  this care. Rolley Sims  participated in the discussions, expressed understanding, and voiced agreement with the above plans.  All questions were answered to her satisfaction. she is encouraged to contact clinic should she have any questions or concerns prior to her return visit.    Glade Lloyd, MD Phone: [phone]  Fax: [phone]   01/12/2021, 11:16 AM

## 2021-01-26 DIAGNOSIS — M47816 Spondylosis without myelopathy or radiculopathy, lumbar region: Secondary | ICD-10-CM | POA: Diagnosis not present

## 2021-01-26 DIAGNOSIS — M5416 Radiculopathy, lumbar region: Secondary | ICD-10-CM | POA: Diagnosis not present

## 2021-02-02 ENCOUNTER — Other Ambulatory Visit: Payer: Self-pay | Admitting: Family Medicine

## 2021-02-09 DIAGNOSIS — M5416 Radiculopathy, lumbar region: Secondary | ICD-10-CM | POA: Diagnosis not present

## 2021-02-17 DIAGNOSIS — J392 Other diseases of pharynx: Secondary | ICD-10-CM | POA: Diagnosis not present

## 2021-02-17 DIAGNOSIS — R1013 Epigastric pain: Secondary | ICD-10-CM | POA: Diagnosis not present

## 2021-02-17 DIAGNOSIS — K219 Gastro-esophageal reflux disease without esophagitis: Secondary | ICD-10-CM | POA: Diagnosis not present

## 2021-02-17 DIAGNOSIS — R682 Dry mouth, unspecified: Secondary | ICD-10-CM | POA: Diagnosis not present

## 2021-02-20 NOTE — H&P (View-Only) (Signed)
Referring Provider: Fayrene Helper, MD Primary Care Physician:  Fayrene Helper, MD Primary GI Physician: Dr. Abbey Chatters  Chief Complaint  Patient presents with  . Gastroesophageal Reflux    Reports she went to see [facility] 2 weeks ago on her own (we did not refer). Prescribed Carafate 4 times a day and does not feel it is helping    HPI:   Laura [name] is a 70 y.o. female presenting today for follow-up of flushing. History of GERD, constipation, anemia s/p thorough evaluation, and nocturnal flushing which started in March 2021 with associated weight loss. Colonoscopy 2016 unremarkable with a 10-year recall. Givens capsule in 2018 with gastritis. Most recent EGD June 2021 with a few fundic gland polyps, otherwise unremarkable. Biopsies did show chronic gastritis negative for H. pylori.   Additional workup has included:  CT June 2021 with no significant findings.  HIDA September 2021 with slightly low EF at 27%.  labs that had been completed included catecholamines,metanephrines, ESR, CRP, tryptase all of which were ok. 5 HIAA elevated at 13.6. TSH borderline low. CT A/P with and without contrast 10/06/20 with no acute findings.  NM PET with Dotatate 11/13/2020 with no evidence of well-differentiated neuroendocrine tumor within the abdomen or pelvis.  No evidence of metastatic disease.  Notably, at her last visit in December 2021, she actually experienced one of her flushing episodes which lasted 5 minutes without associated shortness of breath, lightheadedness, dizziness, chest pain, numbness or tingling, headache, abdominal pain, nausea, vomiting, or prodrome.  Unable to tell me if symptoms were similar to prophylaxis with menopause.  Denies any association with meals or emotional stress.  States she was eating very well.  She had gained 5 pounds over the last month.  GERD was fairly well managed though she reported previously doing better on Protonix 40 mg twice daily and was  currently on omeprazole 20 mg twice daily. Her omeprazole was increased to 40 mg twice daily.  She is scheduled for PET scan with results as per above.  Also recommended further evaluation with endocrine and GYN.  *After her nuclear medicine PET scan in January, I spoke with patient on 11/19/20.  She stated she had not had any further trouble with flushing since her office visit.  She is not sure why.  Stated she had not made any changes.  The only new thing that occurred was an injection in her back and she was also started on Celebrex as needed for back pain.  Denies changes in diet, stress level, activity level, or sleep.  She was advised to monitor for return of flushing and keep a log of when this occurs, what occurred prior to onset including foods eaten, stress, sleep schedule, activity, other pain, etc. We had discussed proceeding with a colonoscopy if her PET scan was unrevealing and she continued with symptoms, but as she was asymptomatic, it was decided to hold off on colonoscopy.  Case was discussed with Dr. Abbey Chatters.  Today:  States flushing never resolved completely, but symptoms improved quite a bit after our last office visit.  States they were just very mild.  Over the last few weeks, they have been worsening. Still the same with burning from abdomen up through chest, down her arms, and into her hands. Wakes her from sleep around 5-6 am. Denies typical acid reflux symptoms. Has a lot of belching. Taking omeprazole 40 mg BID Feels this isn't working as well for her as it did initially. No  dysphagia. Saw [facility] and they prescribed Carafate which she is taking twice a day without improvement.   Still with no identified trigger or flushing.  Again, denies any association with meals or certain dietary triggers.  Had gained up to 193, but back down to 186. She tells me she is now limiting her intake because she is worried that eating may bring on symptoms even though eating has never caused her to  have flushing.  In general, does not eat fried foods or spicy foods.  No soda.  No caffeine.  She does drink for a straw.  Good BMs every day. No constipation or diarrhea. No brbpr or melena.   Past Medical History:  Diagnosis Date  . Anxiety   . At low risk for fall 08/15/2018  . Cystitis   . Depression   . Gastritis   . GERD (gastroesophageal reflux disease)   . Hashimoto's thyroiditis    Hx   . Hyperlipidemia   . Hypertension   . Hypothyroidism   . Normocytic anemia 2009 Hb 9.9-11.1  . Osteopenia   . Shoulder pain   . Urinary incontinence     Past Surgical History:  Procedure Laterality Date  . Bilateral tubal ligation    . BIOPSY  04/21/2020   Procedure: BIOPSY;  Surgeon: Daneil Dolin, MD;  Location: [facility];  Service: Endoscopy;;  . BREAST EXCISIONAL BIOPSY  2010   Excisional biopsy of benign left breast mass -lopoma   . BREAST SURGERY     left nreast biopsy  . COLONOSCOPY  08/26/2011   SLF: 1. Internal hemorrhoids  . COLONOSCOPY N/A 10/06/2015   XAJ:OINO fissure or internal hemorrhoids/mild sigmoid colitis. benign colonic path   . colonscopy  2005   Dr. Tamala Julian  . ESOPHAGOGASTRODUODENOSCOPY  12/2009   chronic gastritis  . ESOPHAGOGASTRODUODENOSCOPY N/A 05/17/2017   Dr. Oneida Alar; gastritis, normal small bowel biopsies, multiple gastric polyps. fundic gland polyps  . ESOPHAGOGASTRODUODENOSCOPY (EGD) WITH PROPOFOL N/A 04/21/2020   Rourk: multiple gastric polyps, biopsy of one c/w fundic gland. gastric biopsy showed mild chronic gastritis but no H.pylori  . GIVENS CAPSULE STUDY N/A 05/30/2017   gastritis, no source for anemia identified  . left knee surgery Left March 1,2017  . PARTIAL KNEE ARTHROPLASTY Left 08/09/2017   Procedure: LEFT UNICOMPARTMENTAL KNEE;  Surgeon: Gaynelle Arabian, MD;  Location: [facility];  Service: Orthopedics;  Laterality: Left;  . POLYPECTOMY  04/21/2020   Procedure: POLYPECTOMY;  Surgeon: Daneil Dolin, MD;  Location: [facility];  Service:  Endoscopy;;  gastric  . Resection of left lobe of thyroid    . right carpal tunnel release    . rt. knee athroscopy  2004  . SAVORY DILATION N/A 05/17/2017   Procedure: SAVORY DILATION;  Surgeon: Danie Binder, MD;  Location: [facility];  Service: Endoscopy;  Laterality: N/A;  . TOTAL ABDOMINAL HYSTERECTOMY  1994  . UMBILICAL HERNIA REPAIR    . Urethral dilation for stenosis  2009    Current Outpatient Medications  Medication Sig Dispense Refill  . acetaminophen (TYLENOL) 500 MG tablet Take 1 tablet (500 mg total) by mouth every 6 (six) hours as needed. (Patient taking differently: Take 500 mg by mouth every 6 (six) hours as needed for mild pain.) 30 tablet 0  . amLODipine (NORVASC) 2.5 MG tablet TAKE 1 TABLET(2.5 MG) BY MOUTH DAILY 90 tablet 1  . atorvastatin (LIPITOR) 80 MG tablet TAKE 1 TABLET BY MOUTH  DAILY 90 tablet 3  .  celecoxib (CELEBREX) 200 MG capsule Take 200 mg by mouth daily as needed.    . citalopram (CELEXA) 40 MG tablet TAKE 1 TABLET(40 MG) BY MOUTH DAILY 30 tablet 3  . esomeprazole (NEXIUM) 40 MG capsule Take 1 capsule (40 mg total) by mouth 2 (two) times daily before a meal. 60 capsule 3  . gabapentin (NEURONTIN) 100 MG capsule TAKE 1 CAPSULE BY MOUTH AT  BEDTIME 90 capsule 3  . hydrochlorothiazide (HYDRODIURIL) 25 MG tablet TAKE 1 TABLET BY MOUTH  DAILY 90 tablet 3  . levothyroxine (SYNTHROID) 100 MCG tablet Take 1 tablet (100 mcg total) by mouth daily. 90 tablet 3  . linaclotide (LINZESS) 72 MCG capsule Take 1 capsule (72 mcg total) by mouth daily before breakfast. (Patient taking differently: Take 72 mcg by mouth daily as needed (constipation.).) 90 capsule 3  . loratadine (CLARITIN) 10 MG tablet Take 1 tablet (10 mg total) by mouth daily. (Patient taking differently: Take 10 mg by mouth daily as needed for allergies.) 30 tablet 3  . methocarbamol (ROBAXIN) 500 MG tablet Take 500 mg by mouth 3 (three) times daily.    . mirtazapine (REMERON) 15 MG tablet TAKE 1  TABLET(15 MG) BY MOUTH AT BEDTIME 30 tablet 3  . montelukast (SINGULAIR) 10 MG tablet Take 1 tablet (10 mg total) by mouth at bedtime. 30 tablet 3  . Polyethyl Glycol-Propyl Glycol 0.4-0.3 % SOLN Apply 1 drop to eye 3 (three) times daily as needed (for dry eyes.).    Marland Kitchen potassium chloride SA (KLOR-CON) 20 MEQ tablet Take two tablets by mouth every morning and one tablet every evening 90 tablet 4  . Probiotic Product (PROBIOTIC ADVANCED PO) Take 1 capsule by mouth daily. [facility]    . Sucralfate (CARAFATE PO) Take by mouth. 4 times per day     No current facility-administered medications for this visit.    Allergies as of 02/22/2021 - Review Complete 02/22/2021  Allergen Reaction Noted  . Cozaar [losartan potassium] Cough 07/04/2018  . Dilaudid [hydromorphone hcl] Other (See Comments) 03/01/2011  . Levaquin [levofloxacin] Other (See Comments) 07/28/2014  . Nsaids Other (See Comments) 01/13/2010  . Promethazine  05/17/2017    Family History  Problem Relation Age of Onset  . Pneumonia Mother   . Healthy Father   . Colon cancer Neg Hx   . Anesthesia problems Neg Hx   . Hypotension Neg Hx   . Malignant hyperthermia Neg Hx   . Pseudochol deficiency Neg Hx     Social History   Socioeconomic History  . Marital status: Married    Spouse name: Not on file  . Number of children: 3  . Years of education: Not on file  . Highest education level: Not on file  Occupational History  . Occupation: Production manager producing automobile parts   Tobacco Use  . Smoking status: Former Smoker    Packs/day: 0.25    Years: 1.00    Pack years: 0.25    Types: Cigarettes    Quit date: 08/25/1972    Years since quitting: 48.5  . Smokeless tobacco: Never Used  Vaping Use  . Vaping Use: Never used  Substance and Sexual Activity  . Alcohol use: No  . Drug use: No  . Sexual activity: Not Currently    Birth control/protection: Surgical    Comment: hyst  Other Topics Concern  . Not on  file  Social History Narrative  . Not on file   Social Determinants of Health  Financial Resource Strain: Low Risk   . Difficulty of Paying Living Expenses: Not hard at all  Food Insecurity: No Food Insecurity  . Worried About Charity fundraiser in the Last Year: Never true  . Ran Out of Food in the Last Year: Never true  Transportation Needs: No Transportation Needs  . Lack of Transportation (Medical): No  . Lack of Transportation (Non-Medical): No  Physical Activity: Inactive  . Days of Exercise per Week: 1 day  . Minutes of Exercise per Session: 0 min  Stress: No Stress Concern Present  . Feeling of Stress : Not at all  Social Connections: Socially Integrated  . Frequency of Communication with Friends and Family: Twice a week  . Frequency of Social Gatherings with Friends and Family: Twice a week  . Attends Religious Services: 1 to 4 times per year  . Active Member of Clubs or Organizations: Yes  . Attends Archivist Meetings: 1 to 4 times per year  . Marital Status: Married    Review of Systems: Gen: Denies fever, chills, cold or flulike symptoms, lightheadedness, dizziness, presyncope, syncope. CV: Denies chest pain or palpitations. Resp: Denies dyspnea or cough. GI: See HPI Derm: Denies rash Heme: See HPI  Physical Exam: BP 139/84   Pulse 67   Temp (!) 97.3 F (36.3 C)   Ht 5' 3"  (1.6 m)   Wt 186 lb 6.4 oz (84.6 kg)   BMI 33.02 kg/m  General:   Alert and oriented. No distress noted. Pleasant and cooperative.  Head:  Normocephalic and atraumatic. Eyes:  Conjuctiva clear without scleral icterus. Heart:  S1, S2 present without murmurs appreciated. Lungs:  Clear to auscultation bilaterally. No wheezes, rales, or rhonchi. No distress.  Abdomen: +BS, soft, and non-distended. Mild TTP in the epigastric area which is chronic. No rebound or guarding. No HSM or masses noted. Msk:  Symmetrical without gross deformities. Normal posture. Extremities:  Without  edema. Neurologic:  Alert and  oriented x4 Psych:  Normal mood and affect.   Assessment: 70 year old female presenting today for follow-up of nocturnal flushing and GERD/belching.  Nocturnal flushing started around March 2021.  Patient wakes in the middle of the night with burning in her abdomen radiating up into her chest, down her arms bilaterally, and into her hands.  Symptoms come and go within about 5 minutes.  No identified triggers.  No associated chest pain, shortness of breath, palpitations, GERD symptoms, lightheadedness, dizziness, etc.  Symptoms had improved somewhat after increasing omeprazole to 40 mg twice daily; however, over the last few weeks, significant symptoms have returned.  Denies typical reflux symptoms though she does report increased belching. No other significant GI symptoms.  Notably, she also saw [facility] on her own a couple weeks ago, started on Carafate without any improvement.  Initially, she had associated weight loss, gained weight when symptoms started to improve, and now with 7 pound weight loss over the last 1.5 months. She has never had any symptoms associated with meals, but she states she is still worried that she may develop symptoms with meals which causes her to limit her intake.    Significant work-up thus far with EGD June 2021 with a few fundic gland polyps, otherwise unremarkable.  Biopsies with chronic gastritis, negative for H. pylori.  CT A/P June 2021 with no significant findings.  HIDA September 2021 with slightly low EF of 27%, but no current symptoms to suggest biliary dyskinesia.  Labs including catecholamines, metanephrines, ESR, CRP, tryptase  all of which were okay.  TSH borderline low.  5 HIAA elevated at 13.6.  CT A/P with and without contrast 10/06/2020 with no acute findings.  NM PET with Dotatate 11/13/2020 with no evidence of well-differentiated neuroendocrine tumor within the abdomen or pelvis.  No evidence of metastatic disease.  Etiology is  not clear.  Unclear if nocturnal GERD may be playing a role as she initially had improvement with increasing omeprazole to 40 mg twice daily.  I will change her omeprazole to Nexium 40 mg twice daily to see if this provides any additional improvement.  To complete GI evaluation, we had discussed possibility of a colonoscopy if she continued with symptoms. I have offered her a colonoscopy; however, as she recently saw [facility], we also discussed that she will either need to continue to follow with our practice or [facility] only.  Patient states she will talk with her husband to determine which GI practice she would like to continue to follow with.  She will call our office back with her decision. She may also have other hormonal abnormalities/changes nocturnally that may be influencing her symptoms.  This can be evaluated by endocrinology which she already follows with.   Plan: 1.  Stop omeprazole and start Nexium 40 mg twice daily 30 minutes before breakfast and dinner.  2.  Patient is to discuss with her husband if she would like to continue to follow with [facility] or continue with [facility].  She will call our office with her decision.  3.  If she continues to follow with [facility], we will proceed with colonoscopy with propofol with Dr. Abbey Chatters in the near future to further evaluate nocturnal flushing and ensure no occult neuroendocrine tumor.  4.  I also advised patient to discuss her nocturnal flushing with endocrinology as well.  5.  If we follow-up with patient, recommend she see Dr. Abbey Chatters only.    Aliene Altes, PA-C [facility] 02/22/2021

## 2021-02-20 NOTE — Progress Notes (Signed)
Referring Provider: Fayrene Helper, MD Primary Care Physician:  Fayrene Helper, MD Primary GI Physician: Dr. Abbey Chatters  Chief Complaint  Patient presents with  . Gastroesophageal Reflux    Reports she went to [facility] 2 weeks ago on her own (we did not refer). Prescribed Carafate 4 times a day and does not feel it is helping    HPI:   Laura [name] is a 70 y.o. female presenting today for follow-up of flushing. History of GERD, constipation, anemia s/p thorough evaluation, and nocturnal flushing which started in March 2021 with associated weight loss. Colonoscopy 2016 unremarkable with a 10-year recall. Givens capsule in 2018 with gastritis. Most recent EGD June 2021 with a few fundic gland polyps, otherwise unremarkable. Biopsies did show chronic gastritis negative for H. pylori.   Additional workup has included:  CT June 2021 with no significant findings.  HIDA September 2021 with slightly low EF at 27%.  labs that had been completed included catecholamines,metanephrines, ESR, CRP, tryptase all of which were ok. 5 HIAA elevated at 13.6. TSH borderline low. CT A/P with and without contrast 10/06/20 with no acute findings.  NM PET with Dotatate 11/13/2020 with no evidence of well-differentiated neuroendocrine tumor within the abdomen or pelvis.  No evidence of metastatic disease.  Notably, at her last visit in December 2021, she actually experienced one of her flushing episodes which lasted 5 minutes without associated shortness of breath, lightheadedness, dizziness, chest pain, numbness or tingling, headache, abdominal pain, nausea, vomiting, or prodrome.  Unable to tell me if symptoms were similar to prophylaxis with menopause.  Denies any association with meals or emotional stress.  States she was eating very well.  She had gained 5 pounds over the last month.  GERD was fairly well managed though she reported previously doing better on Protonix 40 mg twice daily and was  currently on omeprazole 20 mg twice daily. Her omeprazole was increased to 40 mg twice daily.  She is scheduled for PET scan with results as per above.  Also recommended further evaluation with endocrine and GYN.  *After her nuclear medicine PET scan in January, I spoke with patient on 11/19/20.  She stated she had not had any further trouble with flushing since her office visit.  She is not sure why.  Stated she had not made any changes.  The only new thing that occurred was an injection in her back and she was also started on Celebrex as needed for back pain.  Denies changes in diet, stress level, activity level, or sleep.  She was advised to monitor for return of flushing and keep a log of when this occurs, what occurred prior to onset including foods eaten, stress, sleep schedule, activity, other pain, etc. We had discussed proceeding with a colonoscopy if her PET scan was unrevealing and she continued with symptoms, but as she was asymptomatic, it was decided to hold off on colonoscopy.  Case was discussed with Dr. Abbey Chatters.  Today:  States flushing never resolved completely, but symptoms improved quite a bit after our last office visit.  States they were just very mild.  Over the last few weeks, they have been worsening. Still the same with burning from abdomen up through chest, down her arms, and into her hands. Wakes her from sleep around 5-6 am. Denies typical acid reflux symptoms. Has a lot of belching. Taking omeprazole 40 mg BID Feels this isn't working as well for her as it did initially. No  dysphagia. Saw [facility] and they prescribed Carafate which she is taking twice a day without improvement.   Still with no identified trigger or flushing.  Again, denies any association with meals or certain dietary triggers.  Had gained up to 193, but back down to 186. She tells me she is now limiting her intake because she is worried that eating may bring on symptoms even though eating has never caused her to  have flushing.  In general, does not eat fried foods or spicy foods.  No soda.  No caffeine.  She does drink for a straw.  Good BMs every day. No constipation or diarrhea. No brbpr or melena.   Past Medical History:  Diagnosis Date  . Anxiety   . At low risk for fall 08/15/2018  . Cystitis   . Depression   . Gastritis   . GERD (gastroesophageal reflux disease)   . Hashimoto's thyroiditis    Hx   . Hyperlipidemia   . Hypertension   . Hypothyroidism   . Normocytic anemia 2009 Hb 9.9-11.1  . Osteopenia   . Shoulder pain   . Urinary incontinence     Past Surgical History:  Procedure Laterality Date  . Bilateral tubal ligation    . BIOPSY  04/21/2020   Procedure: BIOPSY;  Surgeon: Daneil Dolin, MD;  Location: [facility];  Service: Endoscopy;;  . BREAST EXCISIONAL BIOPSY  2010   Excisional biopsy of benign left breast mass -lopoma   . BREAST SURGERY     left nreast biopsy  . COLONOSCOPY  08/26/2011   SLF: 1. Internal hemorrhoids  . COLONOSCOPY N/A 10/06/2015   BOF:BPZW fissure or internal hemorrhoids/mild sigmoid colitis. benign colonic path   . colonscopy  2005   Dr. Tamala Julian  . ESOPHAGOGASTRODUODENOSCOPY  12/2009   chronic gastritis  . ESOPHAGOGASTRODUODENOSCOPY N/A 05/17/2017   Dr. Oneida Alar; gastritis, normal small bowel biopsies, multiple gastric polyps. fundic gland polyps  . ESOPHAGOGASTRODUODENOSCOPY (EGD) WITH PROPOFOL N/A 04/21/2020   Rourk: multiple gastric polyps, biopsy of one c/w fundic gland. gastric biopsy showed mild chronic gastritis but no H.pylori  . GIVENS CAPSULE STUDY N/A 05/30/2017   gastritis, no source for anemia identified  . left knee surgery Left March 1,2017  . PARTIAL KNEE ARTHROPLASTY Left 08/09/2017   Procedure: LEFT UNICOMPARTMENTAL KNEE;  Surgeon: Gaynelle Arabian, MD;  Location: [facility];  Service: Orthopedics;  Laterality: Left;  . POLYPECTOMY  04/21/2020   Procedure: POLYPECTOMY;  Surgeon: Daneil Dolin, MD;  Location: [facility];  Service:  Endoscopy;;  gastric  . Resection of left lobe of thyroid    . right carpal tunnel release    . rt. knee athroscopy  2004  . SAVORY DILATION N/A 05/17/2017   Procedure: SAVORY DILATION;  Surgeon: Danie Binder, MD;  Location: [facility];  Service: Endoscopy;  Laterality: N/A;  . TOTAL ABDOMINAL HYSTERECTOMY  1994  . UMBILICAL HERNIA REPAIR    . Urethral dilation for stenosis  2009    Current Outpatient Medications  Medication Sig Dispense Refill  . acetaminophen (TYLENOL) 500 MG tablet Take 1 tablet (500 mg total) by mouth every 6 (six) hours as needed. (Patient taking differently: Take 500 mg by mouth every 6 (six) hours as needed for mild pain.) 30 tablet 0  . amLODipine (NORVASC) 2.5 MG tablet TAKE 1 TABLET(2.5 MG) BY MOUTH DAILY 90 tablet 1  . atorvastatin (LIPITOR) 80 MG tablet TAKE 1 TABLET BY MOUTH  DAILY 90 tablet 3  .  celecoxib (CELEBREX) 200 MG capsule Take 200 mg by mouth daily as needed.    . citalopram (CELEXA) 40 MG tablet TAKE 1 TABLET(40 MG) BY MOUTH DAILY 30 tablet 3  . esomeprazole (NEXIUM) 40 MG capsule Take 1 capsule (40 mg total) by mouth 2 (two) times daily before a meal. 60 capsule 3  . gabapentin (NEURONTIN) 100 MG capsule TAKE 1 CAPSULE BY MOUTH AT  BEDTIME 90 capsule 3  . hydrochlorothiazide (HYDRODIURIL) 25 MG tablet TAKE 1 TABLET BY MOUTH  DAILY 90 tablet 3  . levothyroxine (SYNTHROID) 100 MCG tablet Take 1 tablet (100 mcg total) by mouth daily. 90 tablet 3  . linaclotide (LINZESS) 72 MCG capsule Take 1 capsule (72 mcg total) by mouth daily before breakfast. (Patient taking differently: Take 72 mcg by mouth daily as needed (constipation.).) 90 capsule 3  . loratadine (CLARITIN) 10 MG tablet Take 1 tablet (10 mg total) by mouth daily. (Patient taking differently: Take 10 mg by mouth daily as needed for allergies.) 30 tablet 3  . methocarbamol (ROBAXIN) 500 MG tablet Take 500 mg by mouth 3 (three) times daily.    . mirtazapine (REMERON) 15 MG tablet TAKE 1  TABLET(15 MG) BY MOUTH AT BEDTIME 30 tablet 3  . montelukast (SINGULAIR) 10 MG tablet Take 1 tablet (10 mg total) by mouth at bedtime. 30 tablet 3  . Polyethyl Glycol-Propyl Glycol 0.4-0.3 % SOLN Apply 1 drop to eye 3 (three) times daily as needed (for dry eyes.).    Marland Kitchen potassium chloride SA (KLOR-CON) 20 MEQ tablet Take two tablets by mouth every morning and one tablet every evening 90 tablet 4  . Probiotic Product (PROBIOTIC ADVANCED PO) Take 1 capsule by mouth daily. [facility]    . Sucralfate (CARAFATE PO) Take by mouth. 4 times per day     No current facility-administered medications for this visit.    Allergies as of 02/22/2021 - Review Complete 02/22/2021  Allergen Reaction Noted  . Cozaar [losartan potassium] Cough 07/04/2018  . Dilaudid [hydromorphone hcl] Other (See Comments) 03/01/2011  . Levaquin [levofloxacin] Other (See Comments) 07/28/2014  . Nsaids Other (See Comments) 01/13/2010  . Promethazine  05/17/2017    Family History  Problem Relation Age of Onset  . Pneumonia Mother   . Healthy Father   . Colon cancer Neg Hx   . Anesthesia problems Neg Hx   . Hypotension Neg Hx   . Malignant hyperthermia Neg Hx   . Pseudochol deficiency Neg Hx     Social History   Socioeconomic History  . Marital status: Married    Spouse name: Not on file  . Number of children: 3  . Years of education: Not on file  . Highest education level: Not on file  Occupational History  . Occupation: Production manager producing automobile parts   Tobacco Use  . Smoking status: Former Smoker    Packs/day: 0.25    Years: 1.00    Pack years: 0.25    Types: Cigarettes    Quit date: 08/25/1972    Years since quitting: 48.5  . Smokeless tobacco: Never Used  Vaping Use  . Vaping Use: Never used  Substance and Sexual Activity  . Alcohol use: No  . Drug use: No  . Sexual activity: Not Currently    Birth control/protection: Surgical    Comment: hyst  Other Topics Concern  . Not on  file  Social History Narrative  . Not on file   Social Determinants of Health  Financial Resource Strain: Low Risk   . Difficulty of Paying Living Expenses: Not hard at all  Food Insecurity: No Food Insecurity  . Worried About Charity fundraiser in the Last Year: Never true  . Ran Out of Food in the Last Year: Never true  Transportation Needs: No Transportation Needs  . Lack of Transportation (Medical): No  . Lack of Transportation (Non-Medical): No  Physical Activity: Inactive  . Days of Exercise per Week: 1 day  . Minutes of Exercise per Session: 0 min  Stress: No Stress Concern Present  . Feeling of Stress : Not at all  Social Connections: Socially Integrated  . Frequency of Communication with Friends and Family: Twice a week  . Frequency of Social Gatherings with Friends and Family: Twice a week  . Attends Religious Services: 1 to 4 times per year  . Active Member of Clubs or Organizations: Yes  . Attends Archivist Meetings: 1 to 4 times per year  . Marital Status: Married    Review of Systems: Gen: Denies fever, chills, cold or flulike symptoms, lightheadedness, dizziness, presyncope, syncope. CV: Denies chest pain or palpitations. Resp: Denies dyspnea or cough. GI: See HPI Derm: Denies rash Heme: See HPI  Physical Exam: BP 139/84   Pulse 67   Temp (!) 97.3 F (36.3 C)   Ht 5' 3"  (1.6 m)   Wt 186 lb 6.4 oz (84.6 kg)   BMI 33.02 kg/m  General:   Alert and oriented. No distress noted. Pleasant and cooperative.  Head:  Normocephalic and atraumatic. Eyes:  Conjuctiva clear without scleral icterus. Heart:  S1, S2 present without murmurs appreciated. Lungs:  Clear to auscultation bilaterally. No wheezes, rales, or rhonchi. No distress.  Abdomen: +BS, soft, and non-distended. Mild TTP in the epigastric area which is chronic. No rebound or guarding. No HSM or masses noted. Msk:  Symmetrical without gross deformities. Normal posture. Extremities:  Without  edema. Neurologic:  Alert and  oriented x4 Psych:  Normal mood and affect.   Assessment: 70 year old female presenting today for follow-up of nocturnal flushing and GERD/belching.  Nocturnal flushing started around March 2021.  Patient wakes in the middle of the night with burning in her abdomen radiating up into her chest, down her arms bilaterally, and into her hands.  Symptoms come and go within about 5 minutes.  No identified triggers.  No associated chest pain, shortness of breath, palpitations, GERD symptoms, lightheadedness, dizziness, etc.  Symptoms had improved somewhat after increasing omeprazole to 40 mg twice daily; however, over the last few weeks, significant symptoms have returned.  Denies typical reflux symptoms though she does report increased belching. No other significant GI symptoms.  Notably, she also saw [facility] on her own a couple weeks ago, started on Carafate without any improvement.  Initially, she had associated weight loss, gained weight when symptoms started to improve, and now with 7 pound weight loss over the last 1.5 months. She has never had any symptoms associated with meals, but she states she is still worried that she may develop symptoms with meals which causes her to limit her intake.    Significant work-up thus far with EGD June 2021 with a few fundic gland polyps, otherwise unremarkable.  Biopsies with chronic gastritis, negative for H. pylori.  CT A/P June 2021 with no significant findings.  HIDA September 2021 with slightly low EF of 27%, but no current symptoms to suggest biliary dyskinesia.  Labs including catecholamines, metanephrines, ESR, CRP, tryptase  all of which were okay.  TSH borderline low.  5 HIAA elevated at 13.6.  CT A/P with and without contrast 10/06/2020 with no acute findings.  NM PET with Dotatate 11/13/2020 with no evidence of well-differentiated neuroendocrine tumor within the abdomen or pelvis.  No evidence of metastatic disease.  Etiology is  not clear.  Unclear if nocturnal GERD may be playing a role as she initially had improvement with increasing omeprazole to 40 mg twice daily.  I will change her omeprazole to Nexium 40 mg twice daily to see if this provides any additional improvement.  To complete GI evaluation, we had discussed possibility of a colonoscopy if she continued with symptoms. I have offered her a colonoscopy; however, as she recently saw [facility], we also discussed that she will either need to continue to follow with our practice or [facility] only.  Patient states she will talk with her husband to determine which GI practice she would like to continue to follow with.  She will call our office back with her decision. She may also have other hormonal abnormalities/changes nocturnally that may be influencing her symptoms.  This can be evaluated by endocrinology which she already follows with.   Plan: 1.  Stop omeprazole and start Nexium 40 mg twice daily 30 minutes before breakfast and dinner.  2.  Patient is to discuss with her husband if she would like to continue to follow with [facility] or continue with [facility].  She will call our office with her decision.  3.  If she continues to follow with [facility], we will proceed with colonoscopy with propofol with Dr. Abbey Chatters in the near future to further evaluate nocturnal flushing and ensure no occult neuroendocrine tumor.  4.  I also advised patient to discuss her nocturnal flushing with endocrinology as well.  5.  If we follow-up with patient, recommend she see Dr. Abbey Chatters only.    Aliene Altes, PA-C [facility] 02/22/2021

## 2021-02-22 ENCOUNTER — Telehealth: Payer: Self-pay | Admitting: *Deleted

## 2021-02-22 ENCOUNTER — Ambulatory Visit: Payer: Medicare Other | Admitting: Gastroenterology

## 2021-02-22 ENCOUNTER — Other Ambulatory Visit: Payer: Self-pay

## 2021-02-22 ENCOUNTER — Encounter: Payer: Self-pay | Admitting: Gastroenterology

## 2021-02-22 ENCOUNTER — Telehealth: Payer: Self-pay | Admitting: Internal Medicine

## 2021-02-22 VITALS — BP 139/84 | HR 67 | Temp 97.3°F | Ht 63.0 in | Wt 186.4 lb

## 2021-02-22 DIAGNOSIS — K219 Gastro-esophageal reflux disease without esophagitis: Secondary | ICD-10-CM

## 2021-02-22 DIAGNOSIS — R232 Flushing: Secondary | ICD-10-CM | POA: Diagnosis not present

## 2021-02-22 MED ORDER — CLENPIQ 10-3.5-12 MG-GM -GM/160ML PO SOLN: 1.0000 | Freq: Once | ORAL | 0 refills | Status: AC

## 2021-02-22 MED ORDER — ESOMEPRAZOLE MAGNESIUM 40 MG PO CPDR: 40.0000 mg | DELAYED_RELEASE_CAPSULE | Freq: Two times a day (BID) | ORAL | 3 refills | Status: DC

## 2021-02-22 NOTE — Telephone Encounter (Signed)
Called pt. She has been scheduled for 5/16 at 3:00pm. covid test scheduled for 5/13 at 8:30am. Patient aware will send Rx for prep into pharmacy. Advised will mail prep instructions with covid test appt. Confirmed address. Advised to call if she does not receive instructions. She voiced understanding.

## 2021-02-22 NOTE — Telephone Encounter (Signed)
PT RETURNED THE CALL I ADVISED HER IT WAS THE RIGHT Rx SENT IN. PT GOING BACK TO PHARMACY TO SEE IF THEY HAD HER MIXED UP WITH SOMEONE ELSE OR HAD APPLIED HER INSURANCE.

## 2021-02-22 NOTE — Telephone Encounter (Signed)
[phone] PLEASE CALL PATIENT ABOUT HER PRESCRIPTION THAT WAS SENT IN .  SHE SAID IT WAS SUPPOSED TO BE NEXIUM BUT IT WAS EXPENSIVE.

## 2021-02-22 NOTE — Telephone Encounter (Signed)
Noted.   Please arrange TCS with propofol with Dr. Marletta Lor. ASA II  Dx: Nocturnal flushing, evaluate for occult neuroendocrine tumor.    She will need to follow-up with Dr. Marletta Lor only after procedure. Avelina Laine

## 2021-02-22 NOTE — Patient Instructions (Signed)
Stop omeprazole and start Nexium 40 mg twice daily 30 minutes before breakfast and dinner.  Please talk with your husband and decide if you would like to continue to follow with [name] or if you would like to continue to follow with [facility].  Please call our office back to let us know which you prefer.  If you continue to follow with [name], we will arrange for you to have a colonoscopy in the near future with Dr. Marletta Lor.  It was great to see you today!  Ermalinda Memos, PA-C [facility]

## 2021-02-22 NOTE — Addendum Note (Signed)
Addended by: Armstead Peaks on: 02/22/2021 12:20 PM   Modules accepted: Orders

## 2021-02-22 NOTE — Telephone Encounter (Signed)
Phoned and LMOVM for the pt to return call. I checked the chart and generic Nexium was sent in.

## 2021-02-22 NOTE — Telephone Encounter (Signed)
Patient called in stating she wants to proceed with colonoscopy here with [name]

## 2021-02-23 ENCOUNTER — Telehealth: Payer: Self-pay

## 2021-02-23 NOTE — Progress Notes (Signed)
Cc'ed to pcp °

## 2021-02-23 NOTE — Telephone Encounter (Signed)
Patient returning your call. [phone]

## 2021-02-24 NOTE — Telephone Encounter (Signed)
I did not call this pt and I don't see any results we would have been calling for either

## 2021-02-28 ENCOUNTER — Telehealth: Payer: Self-pay | Admitting: Gastroenterology

## 2021-02-28 NOTE — Telephone Encounter (Signed)
I received a letter from this patient last week. Stated she received the "meds (25 pills)". She took one at 7pm and slept 10 hours. Woke up around 6 am with the same symptoms she has been having (flushing/hot sensation starting in her abdomen-see last OV note for details). She swallowed some pepsi and started burping. She then took 2 omeprazole and ate a steak at 12 pm.   Dena, can you call patient to see what medication she is referring to receiving? I prescribed esomeprazole 40 mg BID at her last visit. She was to stop omeprazole when she started esomeprazole. I sent in a Rx to dispense 60 pills.   She should only be taking esomeprazole 40 mg 30 minutes before breakfast and 30 minutes before dinner. She should not be taking omeprazole with esomeprazole as they are in the same class of medications.

## 2021-03-01 ENCOUNTER — Encounter: Payer: Self-pay | Admitting: Internal Medicine

## 2021-03-01 NOTE — Telephone Encounter (Signed)
Phoned and spoke with the pt and was advised the medication she is referring to is esomeprazole 40 mg. States it didn't help her at all and she went back on her Omeprazole this weekend and she is taking it as directed. Pt states she would rather have Omeprazole.

## 2021-03-01 NOTE — Telephone Encounter (Signed)
Noted. I would not consider esomeprazole a failure as she only took this for a few days. However, if she prefers, she can continue with omeprazole 40 mg BID. We will proceed with colonoscopy as planned. She will follow-up with Dr. Marletta Lor after her procedure.   Stacey: Patient's colonoscopy is scheduled for 5/16. Can you arrange a follow-up with Dr. Marletta Lor 2-4 weeks after this procedure, or his next closest available? Dx: Nocturnal flushing.

## 2021-03-01 NOTE — Telephone Encounter (Signed)
Phoned and advised the pt regarding the continuation of Omeprazole 40 mg BID and proceed with colonoscopy and follow up with Dr. Marletta Lor. Pt agreed

## 2021-03-04 DIAGNOSIS — M5416 Radiculopathy, lumbar region: Secondary | ICD-10-CM | POA: Diagnosis not present

## 2021-03-04 DIAGNOSIS — M47816 Spondylosis without myelopathy or radiculopathy, lumbar region: Secondary | ICD-10-CM | POA: Diagnosis not present

## 2021-03-09 ENCOUNTER — Other Ambulatory Visit: Payer: Self-pay | Admitting: Family Medicine

## 2021-03-11 ENCOUNTER — Telehealth: Payer: Self-pay | Admitting: *Deleted

## 2021-03-11 MED ORDER — PEG 3350-KCL-NA BICARB-NACL 420 G PO SOLR: ORAL | 0 refills | Status: DC

## 2021-03-11 NOTE — Telephone Encounter (Signed)
Received call prep for clenpiq was too expensive. Requesting alternative. trylit sent in. Instructions faxed to pharmacy

## 2021-03-12 ENCOUNTER — Other Ambulatory Visit (HOSPITAL_COMMUNITY)
Admission: RE | Admit: 2021-03-12 | Discharge: 2021-03-12 | Disposition: A | Discharge status: Home / Self Care | Payer: Medicare Other | Source: Ambulatory Visit | Attending: Internal Medicine | Admitting: Internal Medicine

## 2021-03-12 ENCOUNTER — Other Ambulatory Visit: Payer: Self-pay

## 2021-03-12 DIAGNOSIS — Z20822 Contact with and (suspected) exposure to covid-19: Secondary | ICD-10-CM | POA: Insufficient documentation

## 2021-03-12 DIAGNOSIS — Z01812 Encounter for preprocedural laboratory examination: Secondary | ICD-10-CM | POA: Insufficient documentation

## 2021-03-12 LAB — BASIC METABOLIC PANEL
Anion gap: 8 (ref 5–15)
BUN: 16 mg/dL (ref 8–23)
CO2: 31 mmol/L (ref 22–32)
Calcium: 9.5 mg/dL (ref 8.9–10.3)
Chloride: 99 mmol/L (ref 98–111)
Creatinine, Ser: 0.86 mg/dL (ref 0.44–1.00)
GFR, Estimated: >60 mL/min (ref >60)
Glucose, Bld: 104 mg/dL — ABNORMAL HIGH (ref 70–99)
Potassium: 3.2 mmol/L — ABNORMAL LOW (ref 3.5–5.1)
Sodium: 138 mmol/L (ref 135–145)

## 2021-03-13 LAB — SARS CORONAVIRUS 2 (TAT 6-24 HRS): SARS Coronavirus 2: NEGATIVE

## 2021-03-15 ENCOUNTER — Encounter (HOSPITAL_COMMUNITY)
Admission: RE | Disposition: A | Discharge status: Home / Self Care | Payer: Self-pay | Source: Home / Self Care | Attending: Internal Medicine

## 2021-03-15 ENCOUNTER — Other Ambulatory Visit: Payer: Self-pay

## 2021-03-15 ENCOUNTER — Ambulatory Visit (HOSPITAL_COMMUNITY)
Admission: RE | Admit: 2021-03-15 | Discharge: 2021-03-15 | Disposition: A | Discharge status: Home / Self Care | Payer: Medicare Other | Attending: Internal Medicine | Admitting: Internal Medicine

## 2021-03-15 ENCOUNTER — Encounter (HOSPITAL_COMMUNITY): Payer: Self-pay

## 2021-03-15 ENCOUNTER — Ambulatory Visit (HOSPITAL_COMMUNITY): Payer: Medicare Other | Admitting: Certified Registered Nurse Anesthetist

## 2021-03-15 DIAGNOSIS — K648 Other hemorrhoids: Secondary | ICD-10-CM | POA: Diagnosis not present

## 2021-03-15 DIAGNOSIS — K219 Gastro-esophageal reflux disease without esophagitis: Secondary | ICD-10-CM | POA: Insufficient documentation

## 2021-03-15 DIAGNOSIS — Z886 Allergy status to analgesic agent status: Secondary | ICD-10-CM | POA: Insufficient documentation

## 2021-03-15 DIAGNOSIS — R634 Abnormal weight loss: Secondary | ICD-10-CM | POA: Insufficient documentation

## 2021-03-15 DIAGNOSIS — Z885 Allergy status to narcotic agent status: Secondary | ICD-10-CM | POA: Diagnosis not present

## 2021-03-15 DIAGNOSIS — Z888 Allergy status to other drugs, medicaments and biological substances status: Secondary | ICD-10-CM | POA: Insufficient documentation

## 2021-03-15 DIAGNOSIS — Z87891 Personal history of nicotine dependence: Secondary | ICD-10-CM | POA: Diagnosis not present

## 2021-03-15 DIAGNOSIS — Z8719 Personal history of other diseases of the digestive system: Secondary | ICD-10-CM | POA: Diagnosis not present

## 2021-03-15 DIAGNOSIS — Z881 Allergy status to other antibiotic agents status: Secondary | ICD-10-CM | POA: Insufficient documentation

## 2021-03-15 DIAGNOSIS — Z791 Long term (current) use of non-steroidal anti-inflammatories (NSAID): Secondary | ICD-10-CM | POA: Diagnosis not present

## 2021-03-15 DIAGNOSIS — Z6832 Body mass index (BMI) 32.0-32.9, adult: Secondary | ICD-10-CM | POA: Diagnosis not present

## 2021-03-15 DIAGNOSIS — R1030 Lower abdominal pain, unspecified: Secondary | ICD-10-CM | POA: Diagnosis not present

## 2021-03-15 DIAGNOSIS — K5730 Diverticulosis of large intestine without perforation or abscess without bleeding: Secondary | ICD-10-CM

## 2021-03-15 DIAGNOSIS — Z79899 Other long term (current) drug therapy: Secondary | ICD-10-CM | POA: Insufficient documentation

## 2021-03-15 HISTORY — PX: COLONOSCOPY WITH PROPOFOL: SHX5780

## 2021-03-15 SURGERY — COLONOSCOPY WITH PROPOFOL
Anesthesia: General

## 2021-03-15 MED ORDER — PROPOFOL 500 MG/50ML IV EMUL
INTRAVENOUS | Status: DC | PRN
  Administered 2021-03-15: 150 ug/kg/min via INTRAVENOUS

## 2021-03-15 MED ORDER — PROPOFOL 10 MG/ML IV BOLUS
INTRAVENOUS | Status: DC | PRN
  Administered 2021-03-15: 100 mg via INTRAVENOUS

## 2021-03-15 MED ORDER — LACTATED RINGERS IV SOLN: INTRAVENOUS | Status: DC

## 2021-03-15 NOTE — Transfer of Care (Signed)
Immediate Anesthesia Transfer of Care Note  Patient: Laura [name]  Procedure(s) Performed: COLONOSCOPY WITH PROPOFOL (N/A )  Patient Location: PACU  Anesthesia Type:General  Level of Consciousness: awake and alert   Airway & Oxygen Therapy: Patient Spontanous Breathing  Post-op Assessment: Report given to RN and Post -op Vital signs reviewed and stable  Post vital signs: Reviewed and stable  Last Vitals:  Vitals Value Taken Time  BP    Temp    Pulse    Resp    SpO2      Last Pain:  Vitals:   03/15/21 0835  TempSrc: Oral  PainSc: 0-No pain      Patients Stated Pain Goal: 8 (03/15/21 0835)  Complications: No complications documented.

## 2021-03-15 NOTE — Anesthesia Preprocedure Evaluation (Signed)
Anesthesia Evaluation  Patient identified by MRN, date of birth, ID band Patient awake    Reviewed: Allergy & Precautions, H&P , NPO status , Patient's Chart, lab work & pertinent test results, reviewed documented beta blocker date and time   Airway Mallampati: II  TM Distance: >3 FB Neck ROM: full    Dental no notable dental hx.    Pulmonary neg pulmonary ROS, former smoker,    Pulmonary exam normal breath sounds clear to auscultation       Cardiovascular Exercise Tolerance: Good hypertension, negative cardio ROS   Rhythm:regular Rate:Normal     Neuro/Psych PSYCHIATRIC DISORDERS Anxiety Depression  Neuromuscular disease    GI/Hepatic Neg liver ROS, GERD  Medicated,  Endo/Other  Hypothyroidism   Renal/GU negative Renal ROS  negative genitourinary   Musculoskeletal   Abdominal   Peds  Hematology  (+) Blood dyscrasia, anemia ,   Anesthesia Other Findings   Reproductive/Obstetrics negative OB ROS                             Anesthesia Physical Anesthesia Plan  ASA: II  Anesthesia Plan: General   Post-op Pain Management:    Induction:   PONV Risk Score and Plan: Propofol infusion  Airway Management Planned:   Additional Equipment:   Intra-op Plan:   Post-operative Plan:   Informed Consent: I have reviewed the patients History and Physical, chart, labs and discussed the procedure including the risks, benefits and alternatives for the proposed anesthesia with the patient or authorized representative who has indicated his/her understanding and acceptance.     Dental Advisory Given  Plan Discussed with: CRNA  Anesthesia Plan Comments:         Anesthesia Quick Evaluation

## 2021-03-15 NOTE — Interval H&P Note (Signed)
History and Physical Interval Note:  03/15/2021 9:48 AM  Laura [name]  has presented today for surgery, with the diagnosis of nocturnal flushing, eval for occult neuroendocrine tumor.  The various methods of treatment have been discussed with the patient and family. After consideration of risks, benefits and other options for treatment, the patient has consented to  Procedure(s) with comments: COLONOSCOPY WITH PROPOFOL (N/A) - 3:00pm as a surgical intervention.  The patient's history has been reviewed, patient examined, no change in status, stable for surgery.  I have reviewed the patient's chart and labs.  Questions were answered to the patient's satisfaction.     [name]

## 2021-03-15 NOTE — Discharge Instructions (Signed)
  Colonoscopy Discharge Instructions  Read the instructions outlined below and refer to this sheet in the next few weeks. These discharge instructions provide you with general information on caring for yourself after you leave the hospital. Your doctor may also give you specific instructions. While your treatment has been planned according to the most current medical practices available, unavoidable complications occasionally occur.   ACTIVITY  You may resume your regular activity, but move at a slower pace for the next 24 hours.   Take frequent rest periods for the next 24 hours.   Walking will help get rid of the air and reduce the bloated feeling in your belly (abdomen).   No driving for 24 hours (because of the medicine (anesthesia) used during the test).    Do not sign any important legal documents or operate any machinery for 24 hours (because of the anesthesia used during the test).  NUTRITION  Drink plenty of fluids.   You may resume your normal diet as instructed by your doctor.   Begin with a light meal and progress to your normal diet. Heavy or fried foods are harder to digest and may make you feel sick to your stomach (nauseated).   Avoid alcoholic beverages for 24 hours or as instructed.  MEDICATIONS  You may resume your normal medications unless your doctor tells you otherwise.  WHAT YOU CAN EXPECT TODAY  Some feelings of bloating in the abdomen.   Passage of more gas than usual.   Spotting of blood in your stool or on the toilet paper.  IF YOU HAD POLYPS REMOVED DURING THE COLONOSCOPY:  No aspirin products for 7 days or as instructed.   No alcohol for 7 days or as instructed.   Eat a soft diet for the next 24 hours.  FINDING OUT THE RESULTS OF YOUR TEST Not all test results are available during your visit. If your test results are not back during the visit, make an appointment with your caregiver to find out the results. Do not assume everything is normal if  you have not heard from your caregiver or the medical facility. It is important for you to follow up on all of your test results.  SEEK IMMEDIATE MEDICAL ATTENTION IF:  You have more than a spotting of blood in your stool.   Your belly is swollen (abdominal distention).   You are nauseated or vomiting.   You have a temperature over 101.   You have abdominal pain or discomfort that is severe or gets worse throughout the day.   Your colonoscopy was relatively unremarkable.  I did not find a polyps or evidence of colon cancer.  I did not find any neuroendocrine tumors that may explain your symptoms.  Follow-up with GI as previously scheduled.  We will further discuss next plans if any from a GI perspective.  I hope you have a great rest of your week!  Hennie Duos. Marletta Lor, D.O. Gastroenterology and Hepatology [facility]

## 2021-03-15 NOTE — Op Note (Signed)
[facility] Patient Name: Laura [name] Procedure Date: 03/15/2021 10:16 AM MRN: [id number] Date of Birth: 13-Jan-1951 Attending MD: Elon Alas. Abbey Chatters DO CSN: [id number] Age: 70 Admit Type: Outpatient Procedure:                Colonoscopy Indications:              Lower abdominal pain, nocturnal flushing, Positive                            5HIAA urine testing Providers:                Elon Alas. Abbey Chatters, DO, Janeece Riggers, RN, Kristine L.                            Risa Grill, Technician Referring MD:              Medicines:                See the Anesthesia note for documentation of the                            administered medications Complications:            No immediate complications. Estimated Blood Loss:     Estimated blood loss: none. Procedure:                Pre-Anesthesia Assessment:                           - The anesthesia plan was to use monitored                            anesthesia care (MAC).                           After obtaining informed consent, the colonoscope                            was passed under direct vision. Throughout the                            procedure, the patient's blood pressure, pulse, and                            oxygen saturations were monitored continuously. The                            [id number]-[id number] ([id number]) was introduced through the                            anus and advanced to the the cecum, identified by                            appendiceal orifice and ileocecal valve. The                            colonoscopy was performed without difficulty. The  patient tolerated the procedure well. The quality                            of the bowel preparation was evaluated using the                            BBPS [facility] Bowel Preparation Scale) with scores                            of: Right Colon = 2 (minor amount of residual                            staining, small fragments of stool and/or opaque                             liquid, but mucosa seen well), Transverse Colon = 2                            (minor amount of residual staining, small fragments                            of stool and/or opaque liquid, but mucosa seen                            well) and Left Colon = 2 (minor amount of residual                            staining, small fragments of stool and/or opaque                            liquid, but mucosa seen well). The total BBPS score                            equals 6. The quality of the bowel preparation was                            fair. Scope In: 10:27:08 AM Scope Out: 10:47:55 AM Scope Withdrawal Time: 0 hours 14 minutes 8 seconds  Total Procedure Duration: 0 hours 20 minutes 47 seconds  Findings:      The perianal and digital rectal examinations were normal.      Non-bleeding internal hemorrhoids were found during endoscopy.      Multiple small and large-mouthed diverticula were found in the sigmoid       colon and descending colon.      A moderate amount of stool was found in the entire colon, making       visualization difficult. Lavage of the area was performed using copious       amounts of sterile water, resulting in clearance with fair visualization. Impression:               - Preparation of the colon was fair.                           -  Non-bleeding internal hemorrhoids.                           - Diverticulosis in the sigmoid colon and in the                            descending colon.                           - Stool in the entire examined colon.                           - No specimens collected. Moderate Sedation:      Per Anesthesia Care Recommendation:           - Patient has a contact number available for                            emergencies. The signs and symptoms of potential                            delayed complications were discussed with the                            patient. Return to normal activities tomorrow.                             Written discharge instructions were provided to the                            patient.                           - Resume previous diet.                           - Continue present medications.                           - Repeat colonoscopy in 10 years for screening                            purposes.                           - Return to GI clinic in 3 months. Procedure Code(s):        --- Professional ---                           [id number], Colonoscopy, flexible; diagnostic, including                            collection of specimen(s) by brushing or washing,                            when performed (separate procedure) Diagnosis Code(s):        --- Professional ---  K64.8, Other hemorrhoids                           R10.30, Lower abdominal pain, unspecified                           K57.30, Diverticulosis of large intestine without                            perforation or abscess without bleeding CPT copyright 2019 American Medical Association. All rights reserved. The codes documented in this report are preliminary and upon coder review may  be revised to meet current compliance requirements. Elon Alas. Abbey Chatters, DO [place] Abbey Chatters, DO 03/15/2021 10:51:36 AM This report has been signed electronically. Number of Addenda: 0

## 2021-03-15 NOTE — Anesthesia Postprocedure Evaluation (Signed)
Anesthesia Post Note  Patient: Laura [name]  Procedure(s) Performed: COLONOSCOPY WITH PROPOFOL (N/A )  Patient location during evaluation: Phase II Anesthesia Type: General Level of consciousness: awake Pain management: pain level controlled Vital Signs Assessment: post-procedure vital signs reviewed and stable Respiratory status: spontaneous breathing and respiratory function stable Cardiovascular status: blood pressure returned to baseline and stable Postop Assessment: no headache and no apparent nausea or vomiting Anesthetic complications: no Comments: Late entry   No complications documented.   Last Vitals:  Vitals:   03/15/21 0835 03/15/21 1050  BP: (!) 142/76 133/63  Resp: 20 20  Temp: 36.9 C 36.6 C  SpO2: 98% 94%    Last Pain:  Vitals:   03/15/21 1050  TempSrc: Oral  PainSc: 0-No pain                 Windell Norfolk

## 2021-03-19 ENCOUNTER — Encounter (HOSPITAL_COMMUNITY): Payer: Self-pay | Admitting: Internal Medicine

## 2021-03-22 ENCOUNTER — Other Ambulatory Visit: Payer: Self-pay

## 2021-03-22 MED ORDER — AMLODIPINE BESYLATE 2.5 MG PO TABS: ORAL_TABLET | ORAL | 1 refills | Status: DC

## 2021-04-09 DIAGNOSIS — K219 Gastro-esophageal reflux disease without esophagitis: Secondary | ICD-10-CM | POA: Diagnosis not present

## 2021-04-09 DIAGNOSIS — R10816 Epigastric abdominal tenderness: Secondary | ICD-10-CM | POA: Diagnosis not present

## 2021-04-22 ENCOUNTER — Ambulatory Visit (INDEPENDENT_AMBULATORY_CARE_PROVIDER_SITE_OTHER): Payer: Medicare Other | Admitting: Family Medicine

## 2021-04-22 ENCOUNTER — Encounter: Payer: Self-pay | Admitting: Family Medicine

## 2021-04-22 ENCOUNTER — Other Ambulatory Visit: Payer: Self-pay

## 2021-04-22 VITALS — BP 123/74 | HR 70 | Resp 16 | Ht 63.0 in | Wt 183.0 lb

## 2021-04-22 DIAGNOSIS — E669 Obesity, unspecified: Secondary | ICD-10-CM | POA: Diagnosis not present

## 2021-04-22 DIAGNOSIS — I10 Essential (primary) hypertension: Secondary | ICD-10-CM | POA: Diagnosis not present

## 2021-04-22 DIAGNOSIS — M25511 Pain in right shoulder: Secondary | ICD-10-CM

## 2021-04-22 DIAGNOSIS — Z23 Encounter for immunization: Secondary | ICD-10-CM

## 2021-04-22 DIAGNOSIS — M79645 Pain in left finger(s): Secondary | ICD-10-CM

## 2021-04-22 NOTE — Assessment & Plan Note (Signed)
2 week h/o right shoulder pain no trauma, refer Ortho

## 2021-04-22 NOTE — Patient Instructions (Signed)
F/U as before, call if you need me sooner  You are referred To Orthopedics re right shoulder and left thumb pain   You need your shingles  vaccines please get them at your pharmacy  It is important that you exercise regularly at least 30 minutes 5 times a week. If you develop chest pain, have severe difficulty breathing, or feel very tired, stop exercising immediately and seek medical attention   Think about what you will eat, plan ahead. Choose " clean, green, fresh or frozen" over canned, processed or packaged foods which are more sugary, salty and fatty. 70 to 75% of food eaten should be vegetables and fruit. Three meals at set times with snacks allowed between meals, but they must be fruit or vegetables. Aim to eat over a 12 hour period , example 7 am to 7 pm, and STOP after  your last meal of the day. Drink water,generally about 64 ounces per day, no other drink is as healthy. Fruit juice is best enjoyed in a healthy way, by EATING the fruit.  Thanks for choosing [facility], we consider it a privelige to serve you.

## 2021-04-25 NOTE — Progress Notes (Signed)
   Laura [name]     MRN: [id number]      DOB: [birth year]   HPI Laura [name] is here c/o right shoulder pain for the past several weeks,  and left thumb pain , neither has any associated trauma. Thinks she may have been told that it was rheumatoid arthritis and wants further/ appropriate management  ROS Denies recent fever or chills. Denies sinus pressure, nasal congestion, ear pain or sore throat. Denies chest congestion, productive cough or wheezing. Denies chest pains, palpitations and leg swelling Denies abdominal pain, nausea, vomiting,diarrhea or constipation.   Denies dysuria, frequency, hesitancy . Denies headaches, seizures, numbness, or tingling. Denies depression, anxiety or insomnia. Denies skin break down or rash.   PE  BP 123/74   Pulse 70   Resp 16   Ht 5\' 3"  (1.6 m)   Wt 183 lb (83 kg)   SpO2 95%   BMI 32.42 kg/m   Patient alert and oriented and in no cardiopulmonary distress.  HEENT: No facial asymmetry, EOMI,     Neck supple .  Chest: Clear to auscultation bilaterally.  CVS: S1, S2 no murmurs, no S3.Regular rate.  ABD: Soft non tender.   Ext: No edema  MS: Adequate ROM spine,  hips and knees.Decreased ROM right shoulder and left thumb, tender over left thumb tendon  Skin: Intact, no ulcerations or rash noted.  Psych: Good eye contact, normal affect. Memory intact not anxious or depressed appearing.  CNS: CN 2-12 intact, power,  normal throughout.no focal deficits noted.   Assessment & Plan  Right shoulder pain 2 week h/o right shoulder pain no trauma, refer Ortho  Thumb pain, left 2 week history with tenderness and reduced mobility, De Quervene's tenosynovitis, refer Ortho  Obesity (BMI 30.0-34.9)  Patient re-educated about  the importance of commitment to a  minimum of 150 minutes of exercise per week as able.  The importance of healthy food choices with portion control discussed, as well as eating regularly and within a 12 hour window  most days. The need to choose "clean , green" food 50 to 75% of the time is discussed, as well as to make water the primary drink and set a goal of 64 ounces water daily.    Weight /BMI 04/22/2021 03/15/2021 02/22/2021  WEIGHT 183 lb 186 lb 186 lb 6.4 oz  HEIGHT 5\' 3"  5\' 3"  5\' 3"   BMI 32.42 kg/m2 32.95 kg/m2 33.02 kg/m2      Essential hypertension Controlled, no change in medication DASH diet and commitment to daily physical activity for a minimum of 30 minutes discussed and encouraged, as a part of hypertension management. The importance of attaining a healthy weight is also discussed.  BP/Weight 04/22/2021 03/15/2021 02/22/2021 01/12/2021 01/07/2021 10/27/2020 10/19/2020  Systolic BP 123 133 139 142 - 01/14/2021 128  Diastolic BP 74 63 84 83 - 84 84  Wt. (Lbs) 183 186 186.4 190.8 193 182.12 184.8  BMI 32.42 32.95 33.02 33.8 34.19 32.26 32.74

## 2021-04-25 NOTE — Assessment & Plan Note (Signed)
2 week history with tenderness and reduced mobility, De Quervene's tenosynovitis, refer Ortho

## 2021-04-25 NOTE — Assessment & Plan Note (Signed)
Controlled, no change in medication DASH diet and commitment to daily physical activity for a minimum of 30 minutes discussed and encouraged, as a part of hypertension management. The importance of attaining a healthy weight is also discussed.  BP/Weight 04/22/2021 03/15/2021 02/22/2021 01/12/2021 01/07/2021 10/27/2020 10/19/2020  Systolic BP 123 133 139 142 - 993 128  Diastolic BP 74 63 84 83 - 84 84  Wt. (Lbs) 183 186 186.4 190.8 193 182.12 184.8  BMI 32.42 32.95 33.02 33.8 34.19 32.26 32.74

## 2021-04-25 NOTE — Assessment & Plan Note (Signed)
  Patient re-educated about  the importance of commitment to a  minimum of 150 minutes of exercise per week as able.  The importance of healthy food choices with portion control discussed, as well as eating regularly and within a 12 hour window most days. The need to choose "clean , green" food 50 to 75% of the time is discussed, as well as to make water the primary drink and set a goal of 64 ounces water daily.    Weight /BMI 04/22/2021 03/15/2021 02/22/2021  WEIGHT 183 lb 186 lb 186 lb 6.4 oz  HEIGHT 5\' 3"  5\' 3"  5\' 3"   BMI 32.42 kg/m2 32.95 kg/m2 33.02 kg/m2

## 2021-04-26 DIAGNOSIS — M79645 Pain in left finger(s): Secondary | ICD-10-CM | POA: Diagnosis not present

## 2021-04-26 DIAGNOSIS — M25511 Pain in right shoulder: Secondary | ICD-10-CM | POA: Diagnosis not present

## 2021-04-28 ENCOUNTER — Ambulatory Visit: Payer: Medicare Other | Admitting: Internal Medicine

## 2021-04-28 DIAGNOSIS — M1812 Unilateral primary osteoarthritis of first carpometacarpal joint, left hand: Secondary | ICD-10-CM | POA: Diagnosis not present

## 2021-04-29 ENCOUNTER — Other Ambulatory Visit: Payer: Self-pay | Admitting: Family Medicine

## 2021-05-08 ENCOUNTER — Other Ambulatory Visit: Payer: Self-pay | Admitting: Family Medicine

## 2021-05-13 DIAGNOSIS — M5416 Radiculopathy, lumbar region: Secondary | ICD-10-CM | POA: Diagnosis not present

## 2021-05-18 ENCOUNTER — Telehealth: Payer: Self-pay | Admitting: Family Medicine

## 2021-05-18 ENCOUNTER — Other Ambulatory Visit: Payer: Self-pay

## 2021-05-18 ENCOUNTER — Other Ambulatory Visit: Payer: Self-pay | Admitting: Family Medicine

## 2021-05-18 MED ORDER — OXYBUTYNIN CHLORIDE ER 10 MG PO TB24: ORAL_TABLET | ORAL | 1 refills | Status: DC

## 2021-05-18 NOTE — Telephone Encounter (Signed)
Pt aware and sent in med

## 2021-05-18 NOTE — Telephone Encounter (Signed)
Written note left re inontinence, has been on med in the past if she wants to resume inter mittent  use, pls send ditropan 10 mg dailyas needed, 10 tab/ month, refill 1needs appt in next 2 months

## 2021-06-02 ENCOUNTER — Other Ambulatory Visit: Payer: Self-pay

## 2021-06-02 ENCOUNTER — Encounter: Payer: Self-pay | Admitting: Family Medicine

## 2021-06-02 ENCOUNTER — Ambulatory Visit (INDEPENDENT_AMBULATORY_CARE_PROVIDER_SITE_OTHER): Payer: Medicare Other | Admitting: Family Medicine

## 2021-06-02 VITALS — BP 158/78 | HR 92 | Temp 99.0°F | Resp 16 | Ht 63.0 in | Wt 186.8 lb

## 2021-06-02 DIAGNOSIS — R200 Anesthesia of skin: Secondary | ICD-10-CM | POA: Insufficient documentation

## 2021-06-02 DIAGNOSIS — H6591 Unspecified nonsuppurative otitis media, right ear: Secondary | ICD-10-CM | POA: Diagnosis not present

## 2021-06-02 DIAGNOSIS — I10 Essential (primary) hypertension: Secondary | ICD-10-CM | POA: Diagnosis not present

## 2021-06-02 DIAGNOSIS — R2681 Unsteadiness on feet: Secondary | ICD-10-CM

## 2021-06-02 DIAGNOSIS — R202 Paresthesia of skin: Secondary | ICD-10-CM

## 2021-06-02 DIAGNOSIS — J029 Acute pharyngitis, unspecified: Secondary | ICD-10-CM | POA: Diagnosis not present

## 2021-06-02 DIAGNOSIS — R519 Headache, unspecified: Secondary | ICD-10-CM | POA: Diagnosis not present

## 2021-06-02 MED ORDER — AZITHROMYCIN 250 MG PO TABS: ORAL_TABLET | ORAL | 0 refills | Status: AC

## 2021-06-02 MED ORDER — KETOROLAC TROMETHAMINE 60 MG/2ML IM SOLN
60.0000 mg | Freq: Once | INTRAMUSCULAR | Status: AC
  Administered 2021-06-02: 60 mg via INTRAMUSCULAR

## 2021-06-02 MED ORDER — PREDNISONE 5 MG PO TABS: 5.0000 mg | ORAL_TABLET | Freq: Two times a day (BID) | ORAL | 0 refills | Status: AC

## 2021-06-02 NOTE — Progress Notes (Signed)
   Laura [name]     MRN: [id number]      DOB: 12/05/1950   HPI [name] is here with a 2 day h/o headache and imbalance states she is bumping into objects, denies vertigo. C/o burning in arms and hands  No other new neurologic complaints C/o sore throat and right ear pressure x 3 days, no fever or chills, no increased sinus pressure , drainage or cough  ROS  Denies chest pains, palpitations and leg swelling Denies abdominal pain, nausea, vomiting,diarrhea or constipation.   Denies dysuria, frequency, hesitancy or incontinence. Denies depression, anxiety or insomnia. Denies skin break down or rash.   PE  BP (!) 158/78   Pulse 92   Temp 99 F (37.2 C) (Oral)   Resp 16   Ht 5\' 3"  (1.6 m)   Wt 186 lb 12.8 oz (84.7 kg)   SpO2 95%   BMI 33.09 kg/m   Patient alert and oriented and in no cardiopulmonary distress.  HEENT: No facial asymmetry, EOMI,     Neck supple .Erythema or right oropharynx, no exudate Right TM mildly erythematous with reduced light reflex  Chest: Clear to auscultation bilaterally.  CVS: S1, S2 no murmurs, no S3.Regular rate.  ABD: Soft non tender.   Ext: No edema  MS: Adequate ROM spine, shoulders, hips and knees.  Skin: Intact, no ulcerations or rash noted.  Psych: Good eye contact, normal affect. Memory intact not anxious or depressed appearing.  CNS: CN 2-12 intact, power,  normal throughout.no focal deficits noted.No atxia noted on exam, good finger to nose pointing   Assessment & Plan  Essential hypertension Elevated at visit, no med  Change, will reassess as in pain and generally very well controlled  Unsteady gait when walking New x 1  2 days with headche need scan and neurolofy eval  Headache Uncontrolled.Toradol  administered IM in the office , to be followed by a short course of oral prednisone , head scan due to new neurologic symptom of imbalance   Pharyngitis Z pack prescribed  Right otitis media with effusion Z pack and  oral prednisone x 5 days

## 2021-06-02 NOTE — Patient Instructions (Signed)
Follow-up pressure and symptoms in the next 3 to 4 weeks call if you need me sooner.  You are referred for a head scan as well as to see the neurologist.  Toradol 60 mg IM in the office today for the headache followed by 5 days of oral prednisone.  For acute pharyngitis and right ear effusion I have prescribed azithromycin take as directed for 5 days.  Blood pressure slightly elevated at the office today which is unusual assuming this is from pain I have not changed any medications for your blood pressure today and will reevaluate it at the next visit.  If symptoms of headache and unsteady gait or numbness worsen please go to the emergency room .  Thanks for choosing [facility], we consider it a privelige to serve you.

## 2021-06-02 NOTE — Assessment & Plan Note (Signed)
New x 1  2 days with headche need scan and neurolofy eval

## 2021-06-02 NOTE — Assessment & Plan Note (Addendum)
Elevated at visit, no med  Change, will reassess as in pain and generally very well controlled

## 2021-06-03 ENCOUNTER — Other Ambulatory Visit: Payer: Self-pay | Admitting: Family Medicine

## 2021-06-07 ENCOUNTER — Encounter: Payer: Self-pay | Admitting: Family Medicine

## 2021-06-07 DIAGNOSIS — H6591 Unspecified nonsuppurative otitis media, right ear: Secondary | ICD-10-CM | POA: Insufficient documentation

## 2021-06-07 NOTE — Assessment & Plan Note (Signed)
Uncontrolled.Toradol  administered IM in the office , to be followed by a short course of oral prednisone , head scan due to new neurologic symptom of imbalance

## 2021-06-07 NOTE — Assessment & Plan Note (Signed)
Z pack prescribed 

## 2021-06-07 NOTE — Assessment & Plan Note (Signed)
Z pack and oral prednisone x 5 days

## 2021-06-09 ENCOUNTER — Encounter: Payer: Self-pay | Admitting: Internal Medicine

## 2021-06-09 ENCOUNTER — Other Ambulatory Visit: Payer: Self-pay

## 2021-06-09 ENCOUNTER — Ambulatory Visit: Payer: Medicare Other | Admitting: Internal Medicine

## 2021-06-09 VITALS — BP 147/84 | HR 64 | Temp 97.5°F | Ht 63.0 in | Wt 186.8 lb

## 2021-06-09 DIAGNOSIS — R6889 Other general symptoms and signs: Secondary | ICD-10-CM

## 2021-06-09 DIAGNOSIS — K219 Gastro-esophageal reflux disease without esophagitis: Secondary | ICD-10-CM

## 2021-06-09 DIAGNOSIS — K5900 Constipation, unspecified: Secondary | ICD-10-CM | POA: Diagnosis not present

## 2021-06-09 NOTE — Patient Instructions (Signed)
Continue on omeprazole for your chronic reflux.  Continue on Linzess as needed for your constipation.  Agree with neurological evaluation as a potential cause of your burning.  Recommend talking with Ortho about draining your knee.  It was great to see you today as always.  Dr. Marletta Lor

## 2021-06-10 DIAGNOSIS — M5416 Radiculopathy, lumbar region: Secondary | ICD-10-CM | POA: Diagnosis not present

## 2021-06-10 DIAGNOSIS — M7989 Other specified soft tissue disorders: Secondary | ICD-10-CM | POA: Diagnosis not present

## 2021-06-10 DIAGNOSIS — M47816 Spondylosis without myelopathy or radiculopathy, lumbar region: Secondary | ICD-10-CM | POA: Diagnosis not present

## 2021-06-21 ENCOUNTER — Other Ambulatory Visit (HOSPITAL_COMMUNITY): Payer: Self-pay | Admitting: Neurology

## 2021-06-21 ENCOUNTER — Other Ambulatory Visit: Payer: Self-pay | Admitting: Neurology

## 2021-06-21 DIAGNOSIS — I6930 Unspecified sequelae of cerebral infarction: Secondary | ICD-10-CM | POA: Diagnosis not present

## 2021-06-21 DIAGNOSIS — E785 Hyperlipidemia, unspecified: Secondary | ICD-10-CM | POA: Diagnosis not present

## 2021-06-21 DIAGNOSIS — R269 Unspecified abnormalities of gait and mobility: Secondary | ICD-10-CM

## 2021-06-21 DIAGNOSIS — G5603 Carpal tunnel syndrome, bilateral upper limbs: Secondary | ICD-10-CM | POA: Diagnosis not present

## 2021-06-21 DIAGNOSIS — I10 Essential (primary) hypertension: Secondary | ICD-10-CM | POA: Diagnosis not present

## 2021-06-28 DIAGNOSIS — M25562 Pain in left knee: Secondary | ICD-10-CM | POA: Diagnosis not present

## 2021-06-29 ENCOUNTER — Ambulatory Visit (HOSPITAL_COMMUNITY): Payer: Medicare Other

## 2021-06-30 ENCOUNTER — Ambulatory Visit (INDEPENDENT_AMBULATORY_CARE_PROVIDER_SITE_OTHER): Payer: Medicare Other | Admitting: Family Medicine

## 2021-06-30 ENCOUNTER — Other Ambulatory Visit: Payer: Self-pay

## 2021-06-30 ENCOUNTER — Ambulatory Visit (HOSPITAL_COMMUNITY)
Admission: RE | Admit: 2021-06-30 | Discharge: 2021-06-30 | Disposition: A | Discharge status: Home / Self Care | Payer: Medicare Other | Source: Ambulatory Visit | Attending: Family Medicine | Admitting: Family Medicine

## 2021-06-30 VITALS — BP 137/81 | HR 70 | Temp 99.0°F | Resp 18 | Ht 63.0 in | Wt 180.1 lb

## 2021-06-30 DIAGNOSIS — I10 Essential (primary) hypertension: Secondary | ICD-10-CM | POA: Diagnosis not present

## 2021-06-30 DIAGNOSIS — M542 Cervicalgia: Secondary | ICD-10-CM

## 2021-06-30 DIAGNOSIS — F411 Generalized anxiety disorder: Secondary | ICD-10-CM | POA: Diagnosis not present

## 2021-06-30 DIAGNOSIS — Z23 Encounter for immunization: Secondary | ICD-10-CM | POA: Diagnosis not present

## 2021-06-30 DIAGNOSIS — K219 Gastro-esophageal reflux disease without esophagitis: Secondary | ICD-10-CM | POA: Diagnosis not present

## 2021-06-30 DIAGNOSIS — R269 Unspecified abnormalities of gait and mobility: Secondary | ICD-10-CM | POA: Insufficient documentation

## 2021-06-30 DIAGNOSIS — R1013 Epigastric pain: Secondary | ICD-10-CM | POA: Diagnosis not present

## 2021-06-30 IMAGING — DX DG CERVICAL SPINE COMPLETE 4+V
6 series · 6 of 6 positions shown · non-contrast
Comparison: PET-CT [DATE]. report from radiographs of the
cervical spine [DATE] (images unavailable).

CLINICAL DATA: Neck pain [GQ] ([GQ]-CM). Neck pain radiaiting to
arms and shoulders.

EXAM:
CERVICAL SPINE - COMPLETE 4+ VIEW

[c-spine lat]
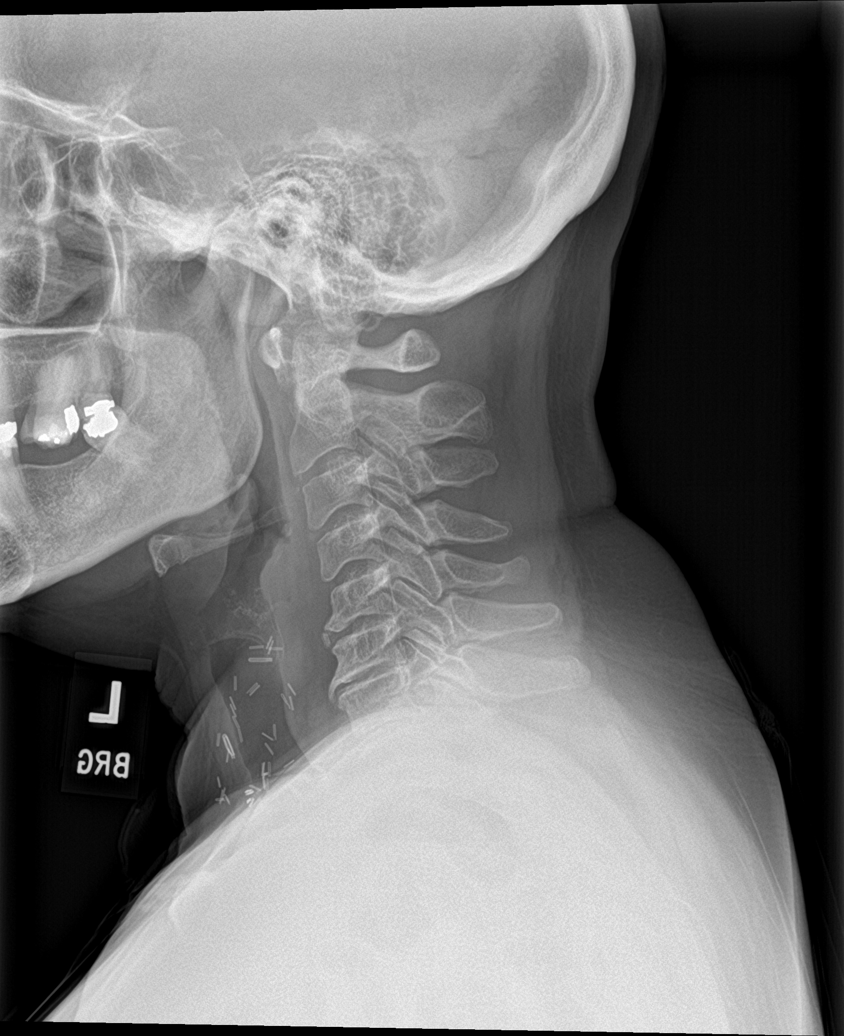

[c-spine obl (1 of 2)]
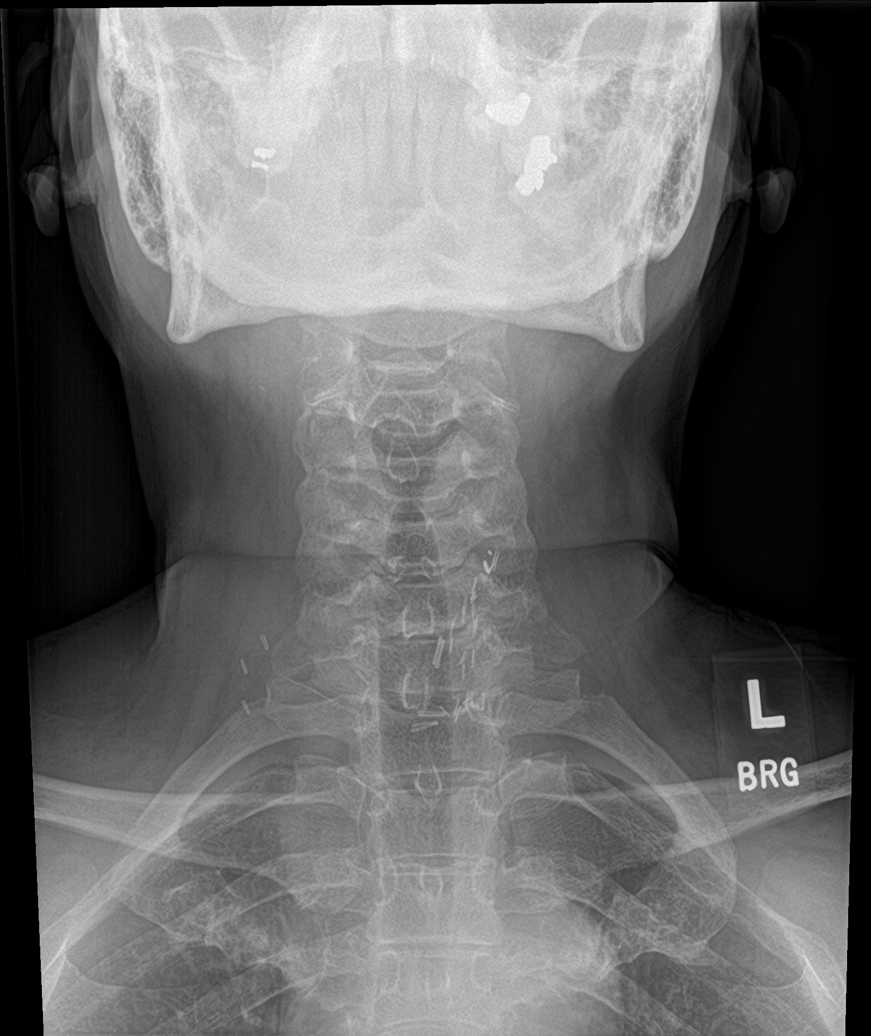

[c-spine obl (2 of 2)]
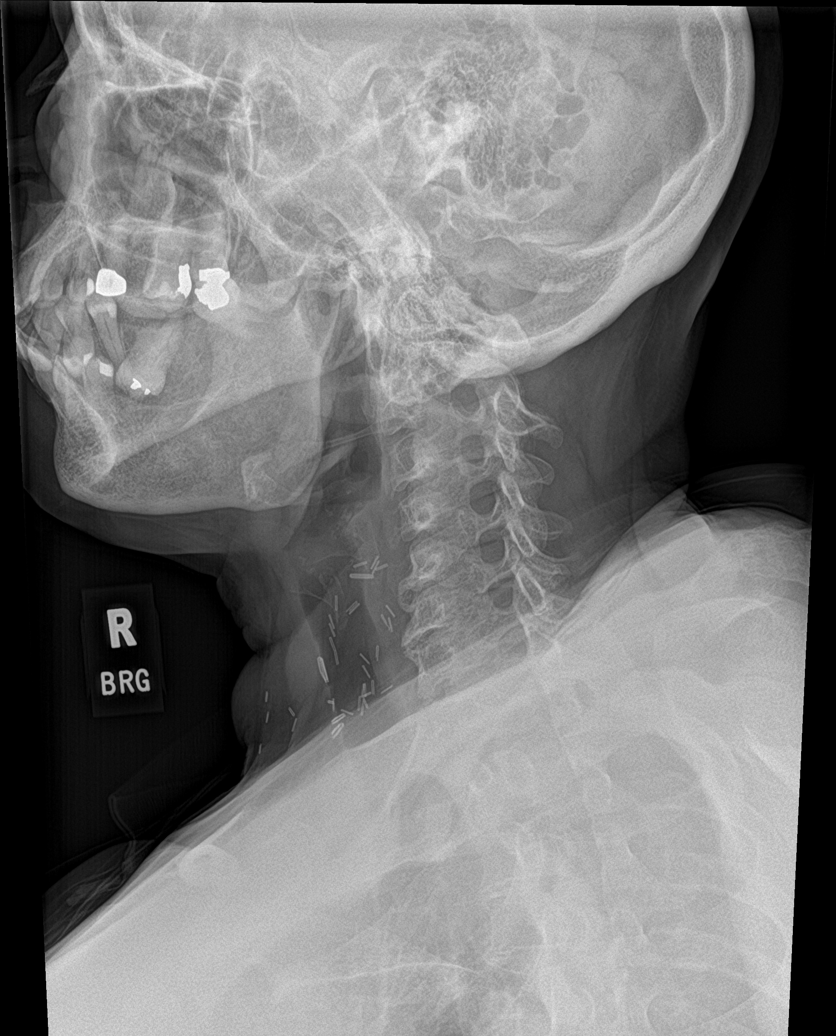

[c-spine ap]
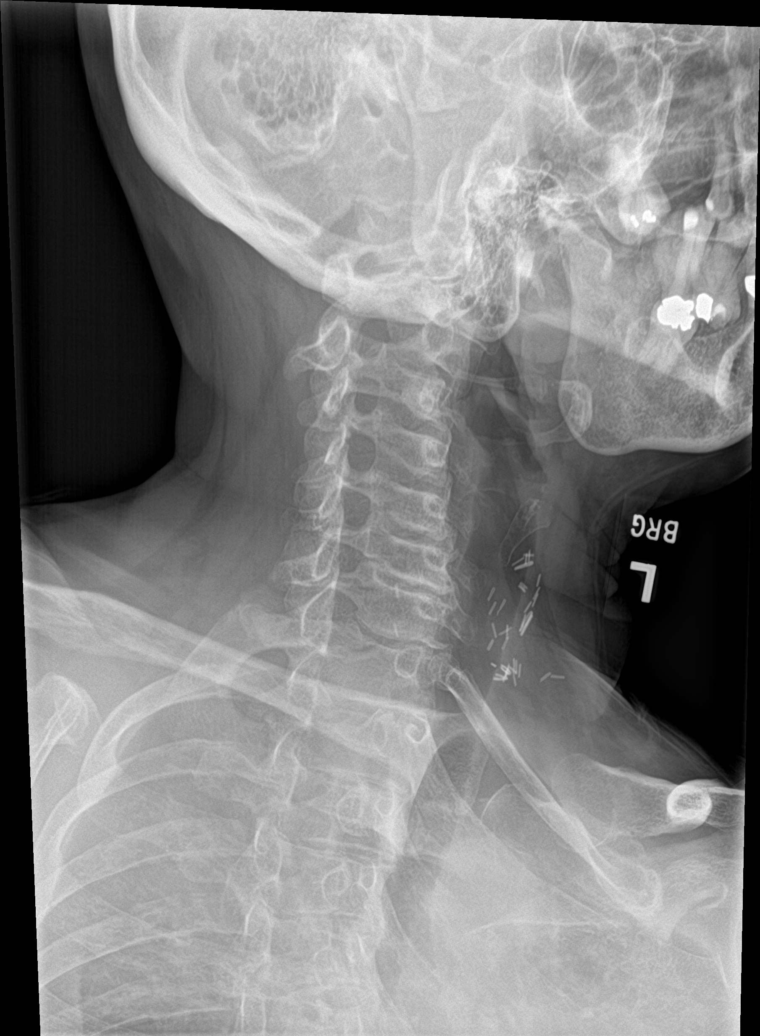

[c-spine open mouth]
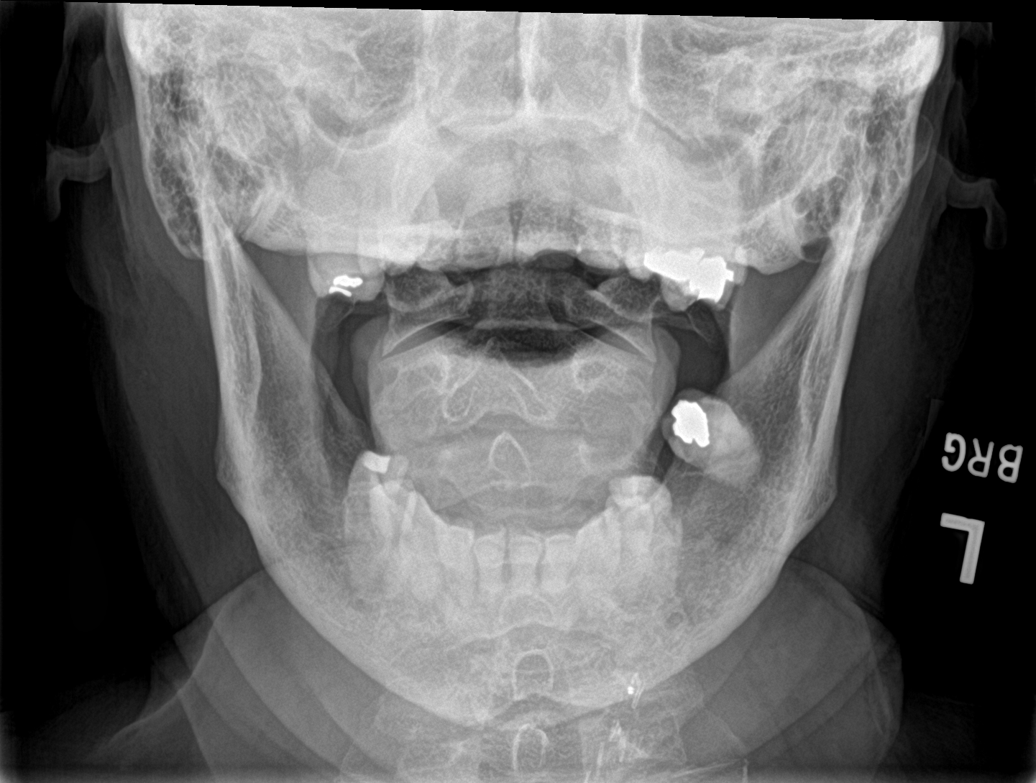

[c-spine swimmers trauma]
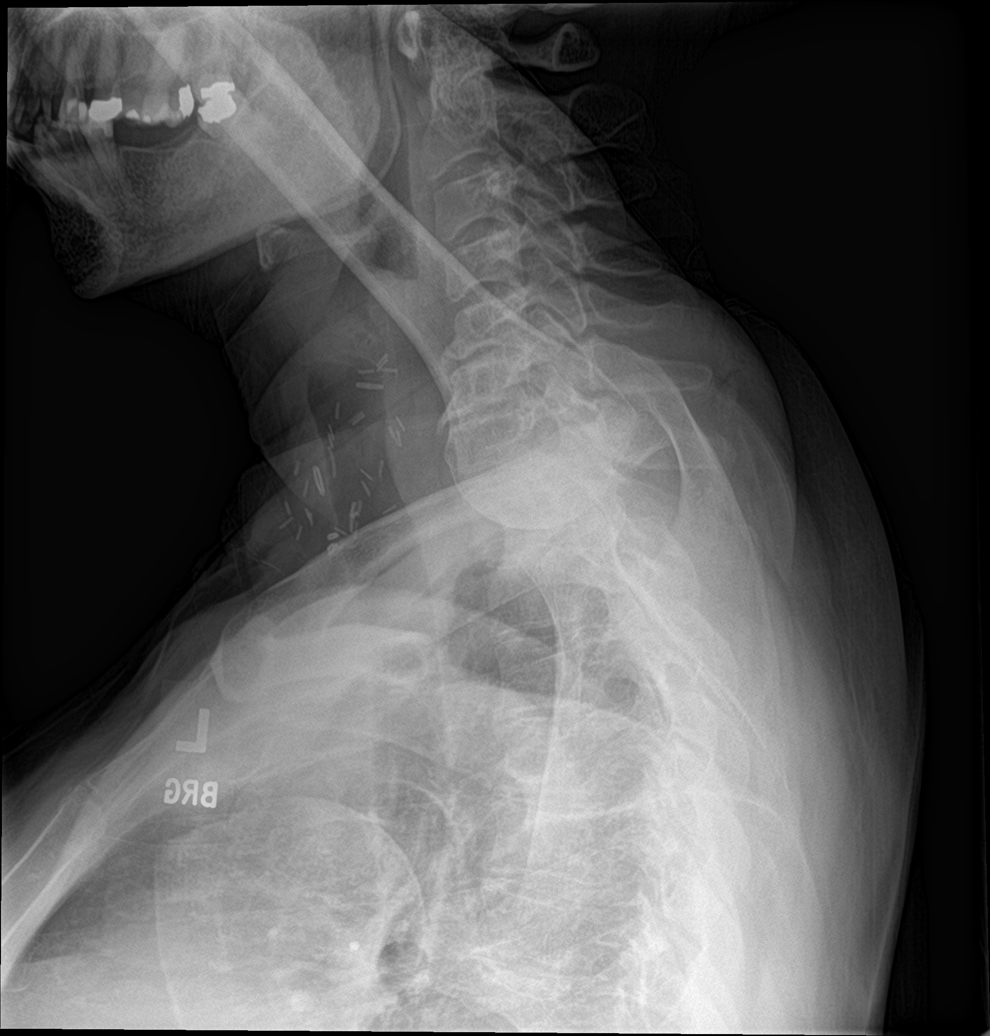

[6 of 6 positions shown; findings below may reference images not displayed]

FINDINGS: Straightening of the expected cervical lordosis. No significant
spondylolisthesis.

Vertebral body height is maintained. No radiographic evidence of
fracture to the cervical spine.

Multilevel disc space narrowing. Most notably, there is
moderate/advanced disc space narrowing at C6-C7. Anterior and
posterior osteophytes at C5-C6 and C6-C7. Multilevel uncovertebral
hypertrophy. Minimal multilevel facet arthrosis. No appreciable
significant bony neural foraminal narrowing.

Unremarkable appearance of the C1-C2 articulation on the dedicated
odontoid view.

Surgical clips within the neck.
IMPRESSION: Cervical spondylosis, as described and greatest at C5-C6 and C6-C7.

Nonspecific straightening of the expected cervical lordosis.

## 2021-06-30 MED ORDER — LORAZEPAM 0.5 MG PO TABS: ORAL_TABLET | ORAL | 0 refills | Status: DC

## 2021-06-30 NOTE — Patient Instructions (Addendum)
Keep September appointment , call if you need me before  Flu vaccine today  You will be referred to Dr Marletta Lor re ongoing recurrent , daily stomach pain that awakens you  Thanks for choosing [facility], we consider it a privelige to serve you.   Happy [birth year] 69 birthday!!!

## 2021-07-01 ENCOUNTER — Ambulatory Visit (HOSPITAL_COMMUNITY)
Admission: RE | Admit: 2021-07-01 | Discharge: 2021-07-01 | Disposition: A | Discharge status: Home / Self Care | Payer: Medicare Other | Source: Ambulatory Visit | Attending: Neurology | Admitting: Neurology

## 2021-07-01 DIAGNOSIS — R269 Unspecified abnormalities of gait and mobility: Secondary | ICD-10-CM | POA: Diagnosis not present

## 2021-07-01 DIAGNOSIS — G319 Degenerative disease of nervous system, unspecified: Secondary | ICD-10-CM | POA: Diagnosis not present

## 2021-07-01 DIAGNOSIS — M542 Cervicalgia: Secondary | ICD-10-CM | POA: Diagnosis not present

## 2021-07-01 IMAGING — MR MR HEAD W/O CM
11 of 12 series · 40 of 48 positions shown · non-contrast
Comparison: Head CT [DATE]. report from brain MRI [DATE]
(images unavailable).

CLINICAL DATA: Abnormal gait [E0] ([E0]-CM). Unspecified
abnormalities of gait and mobility [E0] ([E0]-CM). Additional
history provided by scanning technologist: Patient reports dizziness
for 6 weeks.

EXAM:
MRI HEAD WITHOUT CONTRAST
TECHNIQUE: Multiplanar, multiecho pulse sequences of the brain and surrounding
structures were obtained without intravenous contrast.

[Series 5: DWI · axial · 4.0mm · 0.88mm/px · z∈[-68,+70]mm · 4 of 36 slices shown (1 of 6)]
[im 1/36]
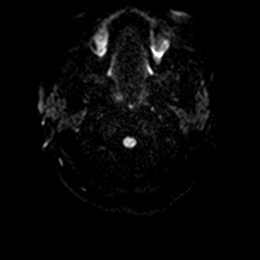
[im 12/36]
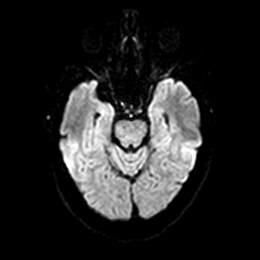
[im 24/36]
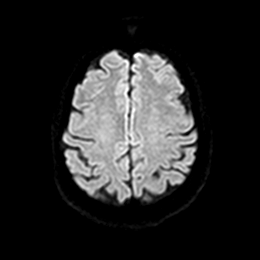
[im 36/36]
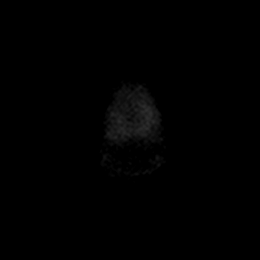

[Series 5: DWI · axial · 4.0mm · 0.88mm/px · z∈[-68,+70]mm · 4 of 36 slices shown (2 of 6)]
[im 1/36]
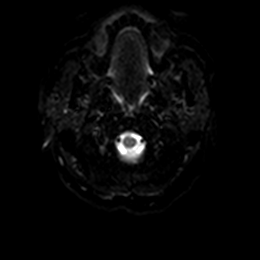
[im 12/36]
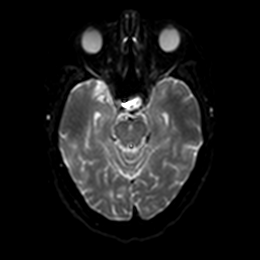
[im 24/36]
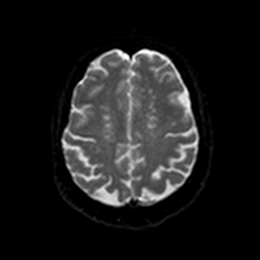
[im 36/36]
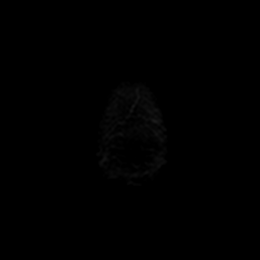

[Series 6: DWI · axial · 4.0mm · 0.88mm/px · z∈[-68,+70]mm · 4 of 36 slices shown (3 of 6)]
[im 1/36]
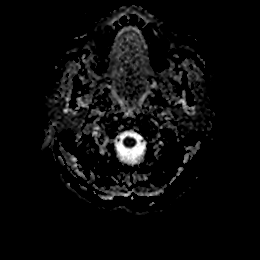
[im 12/36]
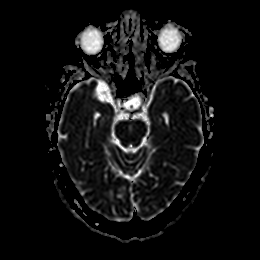
[im 24/36]
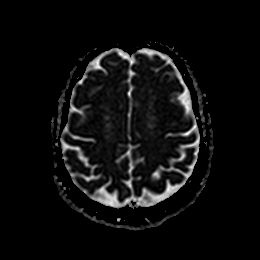
[im 36/36]
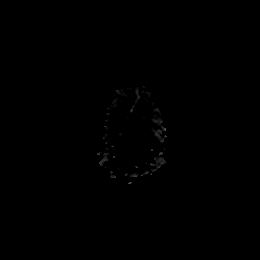

[Series 7: DWI · coronal · 5.0mm · 0.88mm/px · 3 of 28 slices shown (4 of 6)]
[im 1/28]
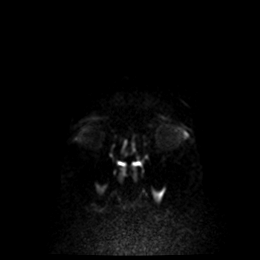
[im 14/28]
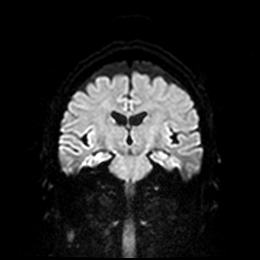
[im 28/28]
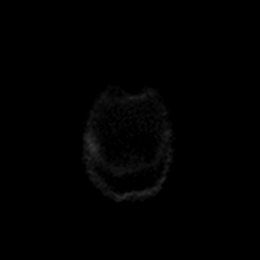

[Series 7: DWI · coronal · 5.0mm · 0.88mm/px · 4 of 28 slices shown (5 of 6)]
[im 1/28]
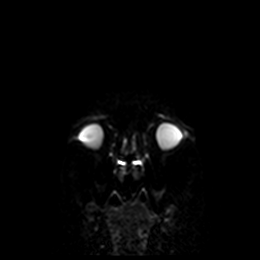
[im 10/28]
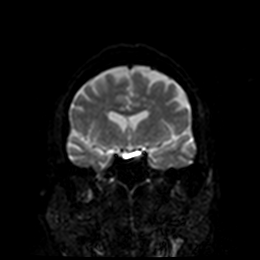
[im 19/28]
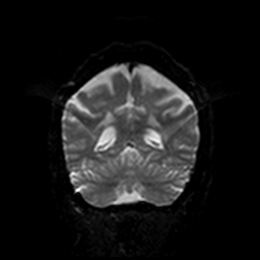
[im 28/28]
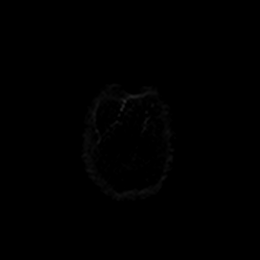

[Series 8: DWI · coronal · 5.0mm · 0.88mm/px · 4 of 28 slices shown (6 of 6)]
[im 1/28]
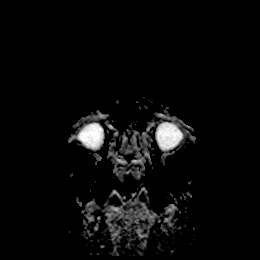
[im 10/28]
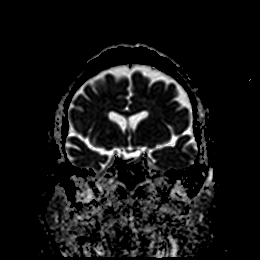
[im 19/28]
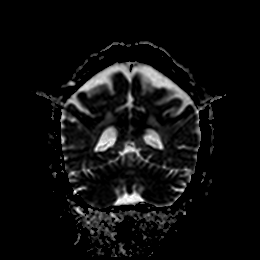
[im 28/28]
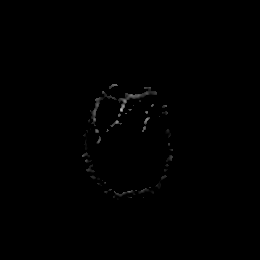

[Series 9: T1 · sagittal · 5.0mm · 0.94mm/px · 3 of 25 slices shown]
[im 1/25]
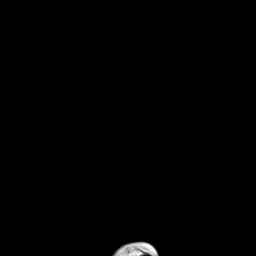
[im 13/25]
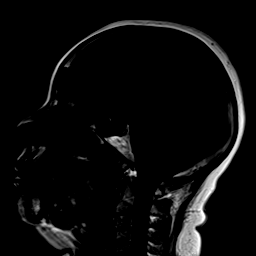
[im 25/25]
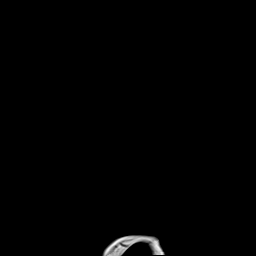

[Series 10: T2 · axial · 5.0mm · 0.72mm/px · z∈[-64,+67]mm · 3 of 20 slices shown (1 of 2)]
[im 1/20]
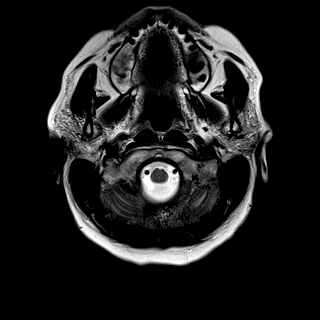
[im 10/20]
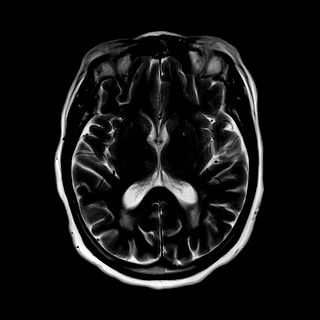
[im 20/20]
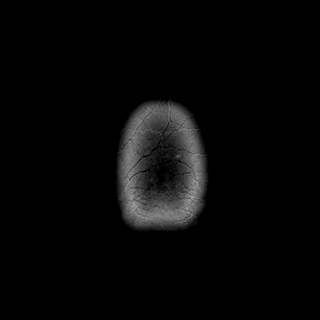

[Series 11: ax hemo · axial · 5.0mm · 0.86mm/px · z∈[-69,+73]mm · 3 of 25 slices shown]
[im 1/25]
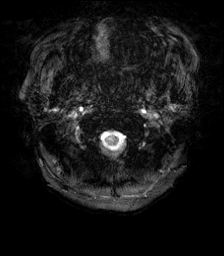
[im 13/25]
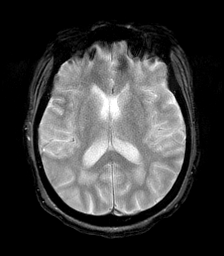
[im 25/25]
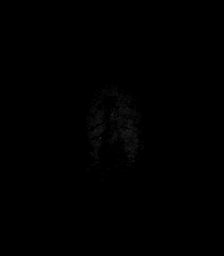

[Series 12: FLAIR · axial · 4.0mm · 0.43mm/px · z∈[-59,+63]mm · 4 of 32 slices shown]
[im 1/32]
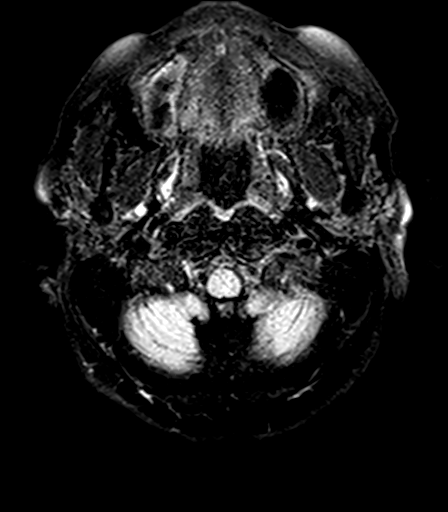
[im 11/32]
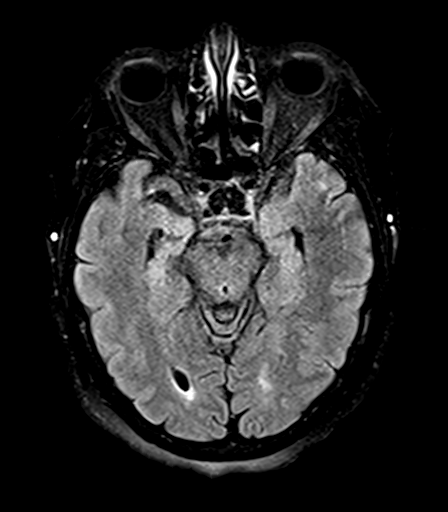
[im 21/32]
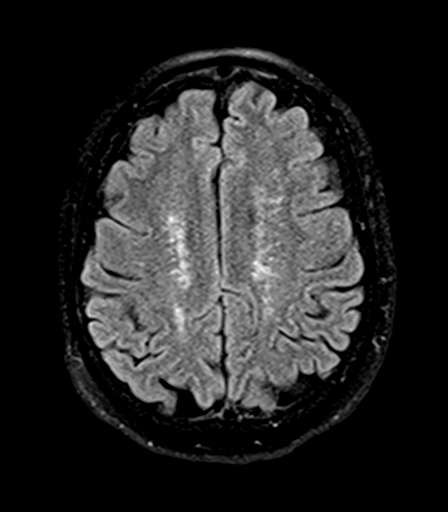
[im 32/32]
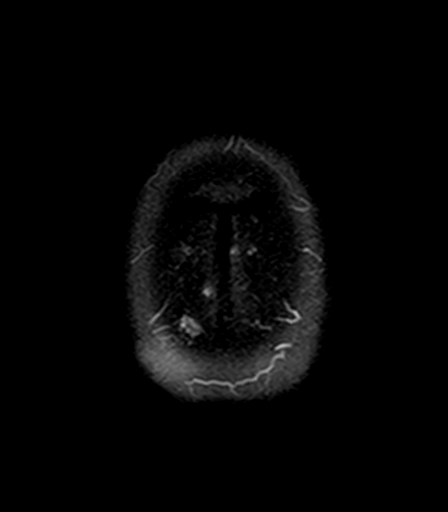

[Series 14: T2 · coronal · 5.0mm · 0.72mm/px · 4 of 28 slices shown (2 of 2)]
[im 1/28]
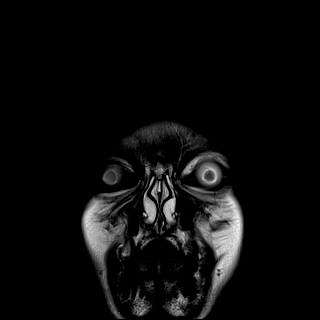
[im 10/28]
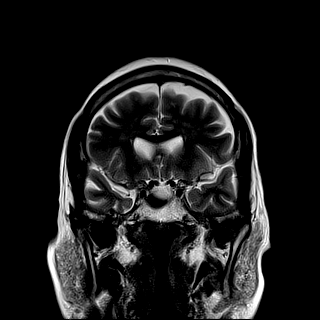
[im 19/28]
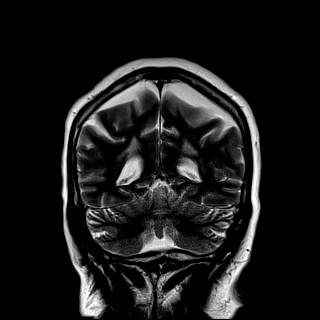
[im 28/28]
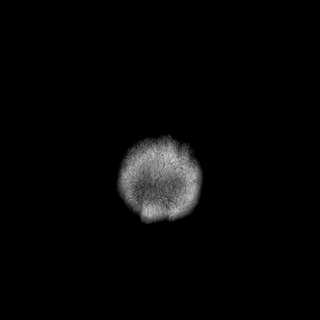

[40 of 48 positions shown; findings below may reference images not displayed]

FINDINGS: Brain:

Mild intermittent motion degradation.

Mild generalized cerebral and cerebellar atrophy.

Mild-to-moderate multifocal T2/FLAIR hyperintensity within the
cerebral white matter, nonspecific but compatible with chronic small
vessel ischemic disease.

Chronic lacunar infarct within the central pons (series 10, image
6).

There is no acute infarct.

No evidence of an intracranial mass.

No chronic intracranial blood products.

No extra-axial fluid collection.

No midline shift.

Partially empty sella turcica.

Vascular: Maintained flow voids within the proximal large arterial
vessels.

Skull and upper cervical spine: No focal suspicious marrow lesion.

Sinuses/Orbits: Visualized orbits show no acute finding. No
significant paranasal sinus disease.

Other: Trace fluid within the left mastoid air cells.
IMPRESSION: Mildly motion degraded exam.

No evidence of acute intracranial abnormality.

Mild-to-moderate chronic small-vessel ischemic changes within the
cerebral white matter.

Chronic lacunar infarct within the central pons.

Mild generalized parenchymal atrophy.

Trace fluid within the left mastoid air cells.

## 2021-07-01 NOTE — Progress Notes (Signed)
Referring Provider: Fayrene Helper, MD Primary Care Physician:  Fayrene Helper, MD Primary GI:  Dr. Abbey Chatters  Chief Complaint  Patient presents with   flushing    Has heat from elbows to hands and in back. Wakes her up. Flushing in abd is better.   Gastroesophageal Reflux    Mouth also dry    HPI:   Laura [name] is a 70 y.o. female who presents to clinic today for follow-up visit.  We have been seeing her for flushing, primarily nocturnal and started in March 2021 with associated weight loss.  She has had an GI work-up including the following:  Colonoscopy 2016 unremarkable with a 10-year recall.   Givens capsule in 2018 with gastritis.   Most recent EGD June 2021 with a few fundic gland polyps, otherwise unremarkable.  Biopsies did show chronic gastritis negative for H. pylori.     CT June 2021 with no significant findings.   HIDA September 2021 with slightly low EF at 27%.   labs that had been completed included catecholamines,metanephrines, ESR, CRP, tryptase all of which were okay  5 HIAA elevated at 13.6.  TSH borderline low.  CT A/P with and without contrast 10/06/20 with no acute findings.   NM PET with Dotatate 11/13/2020 with no evidence of well-differentiated neuroendocrine tumor within the abdomen or pelvis.  No evidence of metastatic disease.  She has GERD which is fairly well controlled on omeprazole constipation well controlled on Linzess  Past Medical History:  Diagnosis Date   Anxiety    At low risk for fall 08/15/2018   Cystitis    Depression    Gastritis    GERD (gastroesophageal reflux disease)    Hashimoto's thyroiditis    Hx    Hyperlipidemia    Hypertension    Hypothyroidism    Normocytic anemia 2009 Hb 9.9-11.1   Osteopenia    Shoulder pain    Urinary incontinence     Past Surgical History:  Procedure Laterality Date   Bilateral tubal ligation     BIOPSY  04/21/2020   Procedure: BIOPSY;  Surgeon: Daneil Dolin, MD;   Location: [facility];  Service: Endoscopy;;   BREAST EXCISIONAL BIOPSY  2010   Excisional biopsy of benign left breast mass -lopoma    BREAST SURGERY     left nreast biopsy   COLONOSCOPY  08/26/2011   SLF: 1. Internal hemorrhoids   COLONOSCOPY N/A 10/06/2015   OZH:YQMV fissure or internal hemorrhoids/mild sigmoid colitis. benign colonic path    COLONOSCOPY WITH PROPOFOL N/A 03/15/2021   Procedure: COLONOSCOPY WITH PROPOFOL;  Surgeon: Eloise Harman, DO;  Location: [facility];  Service: Endoscopy;  Laterality: N/A;  3:00pm   colonscopy  2005   Dr. Tamala Julian   ESOPHAGOGASTRODUODENOSCOPY  12/2009   chronic gastritis   ESOPHAGOGASTRODUODENOSCOPY N/A 05/17/2017   Dr. Oneida Alar; gastritis, normal small bowel biopsies, multiple gastric polyps. fundic gland polyps   ESOPHAGOGASTRODUODENOSCOPY (EGD) WITH PROPOFOL N/A 04/21/2020   Rourk: multiple gastric polyps, biopsy of one c/w fundic gland. gastric biopsy showed mild chronic gastritis but no H.pylori   GIVENS CAPSULE STUDY N/A 05/30/2017   gastritis, no source for anemia identified   left knee surgery Left March 1,2017   PARTIAL KNEE ARTHROPLASTY Left 08/09/2017   Procedure: LEFT UNICOMPARTMENTAL KNEE;  Surgeon: Gaynelle Arabian, MD;  Location: [facility];  Service: Orthopedics;  Laterality: Left;   POLYPECTOMY  04/21/2020   Procedure: POLYPECTOMY;  Surgeon: Daneil Dolin, MD;  Location: [facility];  Service: Endoscopy;;  gastric   Resection of left lobe of thyroid     right carpal tunnel release     rt. knee athroscopy  2004   SAVORY DILATION N/A 05/17/2017   Procedure: SAVORY DILATION;  Surgeon: Danie Binder, MD;  Location: [facility];  Service: Endoscopy;  Laterality: N/A;   TOTAL ABDOMINAL HYSTERECTOMY  8295   UMBILICAL HERNIA REPAIR     Urethral dilation for stenosis  2009    Current Outpatient Medications  Medication Sig Dispense Refill   acetaminophen (TYLENOL) 500 MG tablet Take 1 tablet (500 mg total) by mouth every 6 (six)  hours as needed. (Patient taking differently: Take 1,500 mg by mouth daily as needed for mild pain or headache.) 30 tablet 0   amLODipine (NORVASC) 2.5 MG tablet TAKE 1 TABLET(2.5 MG) BY MOUTH DAILY 90 tablet 1   atorvastatin (LIPITOR) 80 MG tablet TAKE 1 TABLET BY MOUTH  DAILY (Patient taking differently: Take 80 mg by mouth daily.) 90 tablet 3   citalopram (CELEXA) 40 MG tablet TAKE 1 TABLET BY MOUTH  DAILY 30 tablet 11   gabapentin (NEURONTIN) 100 MG capsule TAKE 1 CAPSULE BY MOUTH AT  BEDTIME (Patient taking differently: Take 100 mg by mouth at bedtime.) 90 capsule 3   hydrochlorothiazide (HYDRODIURIL) 25 MG tablet TAKE 1 TABLET BY MOUTH  DAILY (Patient taking differently: Take 25 mg by mouth daily.) 90 tablet 3   Lactobacillus Rhamnosus, GG, (CULTURELLE PO) Take by mouth daily.     levothyroxine (SYNTHROID) 100 MCG tablet Take 1 tablet (100 mcg total) by mouth daily. 90 tablet 3   linaclotide (LINZESS) 72 MCG capsule Take 1 capsule (72 mcg total) by mouth daily before breakfast. (Patient taking differently: Take 72 mcg by mouth daily as needed (constipation.).) 90 capsule 3   lisinopril (ZESTRIL) 40 MG tablet Take 40 mg by mouth daily.     methocarbamol (ROBAXIN) 500 MG tablet Take 500 mg by mouth 3 (three) times daily.     mirtazapine (REMERON) 15 MG tablet TAKE 1 TABLET(15 MG) BY MOUTH AT BEDTIME (Patient taking differently: Take 15 mg by mouth at bedtime.) 30 tablet 3   montelukast (SINGULAIR) 10 MG tablet TAKE 1 TABLET(10 MG) BY MOUTH AT BEDTIME 30 tablet 3   Multiple Vitamin (MULTIVITAMIN WITH MINERALS) TABS tablet Take 1 tablet by mouth daily.     omeprazole (PRILOSEC) 20 MG capsule Take 20 mg by mouth 2 (two) times daily.     oxybutynin (DITROPAN XL) 10 MG 24 hr tablet Once daily as needed 10 tablet 1   Polyethyl Glycol-Propyl Glycol (SYSTANE OP) Place 1 drop into both eyes daily as needed (dry eyes).     Potassium 99 MG TABS Take 99 mg by mouth 2 (two) times daily.     traMADol  (ULTRAM) 50 MG tablet Take 50 mg by mouth every 8 (eight) hours as needed for moderate pain.     LORazepam (ATIVAN) 0.5 MG tablet Take one tablet by mouth  30 minutes before test, you may repeat 15 minutes after if needed, one time only 2 tablet 0   No current facility-administered medications for this visit.    Allergies as of 06/09/2021 - Review Complete 06/09/2021  Allergen Reaction Noted   Cozaar [losartan potassium] Cough 07/04/2018   Dilaudid [hydromorphone hcl] Other (See Comments) 03/01/2011   Levaquin [levofloxacin] Other (See Comments) 07/28/2014   Nsaids Other (See Comments) 01/13/2010   Promethazine  05/17/2017  Family History  Problem Relation Age of Onset   Pneumonia Mother    Healthy Father    Colon cancer Neg Hx    Anesthesia problems Neg Hx    Hypotension Neg Hx    Malignant hyperthermia Neg Hx    Pseudochol deficiency Neg Hx     Social History   Socioeconomic History   Marital status: Married    Spouse name: Not on file   Number of children: 3   Years of education: Not on file   Highest education level: Not on file  Occupational History   Occupation: Designer, 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   Tobacco Use   Smoking status: Former    Packs/day: 0.25    Years: 1.00    Pack years: 0.25    Types: Cigarettes    Quit date: 08/25/1972    Years since quitting: 48.8   Smokeless tobacco: Never  Vaping Use   Vaping Use: Never used  Substance and Sexual Activity   Alcohol use: No   Drug use: No   Sexual activity: Not Currently    Birth control/protection: Surgical    Comment: hyst  Other Topics Concern   Not on file  Social History Narrative   Not on file   Social Determinants of Health   Financial Resource Strain: Low Risk    Difficulty of Paying Living Expenses: Not hard at all  Food Insecurity: No Food Insecurity   Worried About Programme researcher, broadcasting/film/video in the Last Year: Never true   Ran Out of Food in the Last Year: Never true  Transportation  Needs: No Transportation Needs   Lack of Transportation (Medical): No   Lack of Transportation (Non-Medical): No  Physical Activity: Inactive   Days of Exercise per Week: 1 day   Minutes of Exercise per Session: 0 min  Stress: No Stress Concern Present   Feeling of Stress : Not at all  Social Connections: Socially Integrated   Frequency of Communication with Friends and Family: Twice a week   Frequency of Social Gatherings with Friends and Family: Twice a week   Attends Religious Services: 1 to 4 times per year   Active Member of [employer] or Organizations: Yes   Attends Banker Meetings: 1 to 4 times per year   Marital Status: Married    Subjective: Review of Systems  Constitutional:  Negative for chills and fever.  HENT:  Negative for congestion and hearing loss.   Eyes:  Negative for blurred vision and double vision.  Respiratory:  Negative for cough and shortness of breath.   Cardiovascular:  Negative for chest pain and palpitations.  Gastrointestinal:  Positive for constipation and heartburn. Negative for abdominal pain, blood in stool, diarrhea, melena and vomiting.  Genitourinary:  Negative for dysuria and urgency.  Musculoskeletal:  Negative for joint pain and myalgias.  Skin:  Negative for itching and rash.  Neurological:  Negative for dizziness and headaches.  Psychiatric/Behavioral:  Negative for depression. The patient is not nervous/anxious.     Objective: BP (!) 147/84   Pulse 64   Temp (!) 97.5 F (36.4 C) (Temporal)   Ht 5\' 3"  (1.6 m)   Wt 186 lb 12.8 oz (84.7 kg)   BMI 33.09 kg/m  Physical Exam Constitutional:      Appearance: Normal appearance.  HENT:     Head: Normocephalic and atraumatic.  Eyes:     Extraocular Movements: Extraocular movements intact.     Conjunctiva/sclera: Conjunctivae normal.  Cardiovascular:  Rate and Rhythm: Normal rate and regular rhythm.  Pulmonary:     Effort: Pulmonary effort is normal.     Breath sounds:  Normal breath sounds.  Abdominal:     General: Bowel sounds are normal.     Palpations: Abdomen is soft.  Musculoskeletal:        General: No swelling. Normal range of motion.     Cervical back: Normal range of motion and neck supple.  Skin:    General: Skin is warm and dry.     Coloration: Skin is not jaundiced.  Neurological:     General: No focal deficit present.     Mental Status: She is alert and oriented to person, place, and time.  Psychiatric:        Mood and Affect: Mood normal.        Behavior: Behavior normal.     Assessment: *GERD-well-controlled on omeprazole twice daily *Constipation-well-controlled on Linzess 72 mcg as needed *Flushing with elevated 5-HIAA-work-up as per HPI  Plan: Patient's GERD well-controlled on omeprazole.  We will continue  Constipation well controlled on Linzess 72 mcg as needed.  We will continue this as well.  Patient has had extensive work-up for her flushing/burning as per HPI.  We will continue to monitor for now.  Recommend that she follow-up with  Agree with neurological evaluation.  Colonoscopy recall 2026 for colon cancer screening purposes.  Otherwise follow-up with GI in 1 year  07/01/2021 12:23 PM   Disclaimer: This note was dictated with voice recognition software. Similar sounding words can inadvertently be transcribed and may not be corrected upon review.

## 2021-07-05 ENCOUNTER — Encounter: Payer: Self-pay | Admitting: Family Medicine

## 2021-07-05 DIAGNOSIS — M542 Cervicalgia: Secondary | ICD-10-CM | POA: Insufficient documentation

## 2021-07-05 NOTE — Assessment & Plan Note (Signed)
Currently on max dose SRI, may consider addition of buspar at bedtime in the future, is the hope that she will not awaken with symptoms, after gI re eval

## 2021-07-05 NOTE — Assessment & Plan Note (Addendum)
Neck pain radiating to shoulders and arms, needs X ray C spine

## 2021-07-05 NOTE — Progress Notes (Signed)
   Laura [name]     MRN: [id number]      DOB: [birth year]   HPI Laura [name] is here c/o ongoing abdominal pain that she awakens with around 6 am she has pain in the epigastrium rising up into her chest associated with numbness in her arms and hands, lasts about 30 mins Last upper [place] 1 year ago  ROS Denies recent fever or chills. Denies sinus pressure, nasal congestion, ear pain or sore throat. Denies chest congestion, productive cough or wheezing. Denies chest pains, palpitations and leg swelling    Denies dysuria, frequency, hesitancy or incontinence. Denies joint pain, swelling and limitation in mobility. D. Denies skin break down or rash.   PE  BP 137/81   Pulse 70   Temp 99 F (37.2 C)   Resp 18   Ht 5\' 3"  (1.6 m)   Wt 180 lb 1.9 oz (81.7 kg)   SpO2 95%   BMI 31.91 kg/m   Patient alert and oriented and in no cardiopulmonary distress.  HEENT: No facial asymmetry, EOMI,     Neck supple .  Chest: Clear to auscultation bilaterally.  CVS: S1, S2 no murmurs, no S3.Regular rate.  ABD: Soft mild superficial tenderness, no guarding or rebound Ext: No edema  MS: Adequate ROM spine, shoulders, hips and knees.  Skin: Intact, no ulcerations or rash noted.  Psych: Good eye contact, normal affect. Memory intact not anxious or depressed appearing.  CNS: CN 2-12 intact, power,  normal throughout.no focal deficits noted.   Assessment & Plan  Epigastric pain Persistent epigastric pain which awakens pt round 6 am and lasts for approx 30 mins, refer to GI , continues to c/o pain   Gastroesophageal reflux disease probably uncontrolled and contributing to symptoms, refer back to GI  GAD (generalized anxiety disorder) Currently on max dose SRI, may consider addition of buspar at bedtime in the future, is the hope that she will not awaken with symptoms, after gI re eval  Essential hypertension Controlled, no change in medication   Neck pain Neck pain radiating to  shoulders and arms, needs X ray C spine

## 2021-07-05 NOTE — Assessment & Plan Note (Signed)
Controlled, no change in medication  

## 2021-07-05 NOTE — Assessment & Plan Note (Signed)
Persistent epigastric pain which awakens pt round 6 am and lasts for approx 30 mins, refer to GI , continues to c/o pain

## 2021-07-05 NOTE — Assessment & Plan Note (Signed)
probably uncontrolled and contributing to symptoms, refer back to GI

## 2021-07-06 ENCOUNTER — Encounter: Payer: Self-pay | Admitting: Internal Medicine

## 2021-07-07 DIAGNOSIS — E890 Postprocedural hypothyroidism: Secondary | ICD-10-CM | POA: Diagnosis not present

## 2021-07-08 LAB — T4, FREE: Free T4: 1.52 ng/dL (ref 0.82–1.77)

## 2021-07-08 LAB — TSH: TSH: 0.283 u[IU]/mL — ABNORMAL LOW (ref 0.450–4.500)

## 2021-07-13 ENCOUNTER — Telehealth: Payer: Self-pay

## 2021-07-13 NOTE — Telephone Encounter (Signed)
Patient called she went to her pharmacy to get her shingle shot with her insurance still was going to cost her $100.00, she can not afford it.

## 2021-07-15 ENCOUNTER — Encounter: Payer: Self-pay | Admitting: "Endocrinology

## 2021-07-15 ENCOUNTER — Other Ambulatory Visit: Payer: Self-pay

## 2021-07-15 ENCOUNTER — Encounter: Payer: Self-pay | Admitting: Family Medicine

## 2021-07-15 ENCOUNTER — Ambulatory Visit (INDEPENDENT_AMBULATORY_CARE_PROVIDER_SITE_OTHER): Payer: Medicare Other | Admitting: Family Medicine

## 2021-07-15 ENCOUNTER — Ambulatory Visit: Payer: Medicare Other | Admitting: "Endocrinology

## 2021-07-15 VITALS — BP 136/72 | HR 68 | Temp 99.1°F | Resp 20 | Ht 63.0 in | Wt 184.0 lb

## 2021-07-15 VITALS — BP 122/74 | HR 64 | Ht 63.0 in | Wt 183.8 lb

## 2021-07-15 DIAGNOSIS — I10 Essential (primary) hypertension: Secondary | ICD-10-CM | POA: Diagnosis not present

## 2021-07-15 DIAGNOSIS — F322 Major depressive disorder, single episode, severe without psychotic features: Secondary | ICD-10-CM | POA: Diagnosis not present

## 2021-07-15 DIAGNOSIS — E890 Postprocedural hypothyroidism: Secondary | ICD-10-CM | POA: Diagnosis not present

## 2021-07-15 DIAGNOSIS — M5410 Radiculopathy, site unspecified: Secondary | ICD-10-CM | POA: Diagnosis not present

## 2021-07-15 DIAGNOSIS — E785 Hyperlipidemia, unspecified: Secondary | ICD-10-CM | POA: Diagnosis not present

## 2021-07-15 DIAGNOSIS — F411 Generalized anxiety disorder: Secondary | ICD-10-CM

## 2021-07-15 MED ORDER — LEVOTHYROXINE SODIUM 88 MCG PO TABS: 88.0000 ug | ORAL_TABLET | Freq: Every day | ORAL | 1 refills | Status: DC

## 2021-07-15 NOTE — Progress Notes (Signed)
07/15/2021  Endocrinology follow-up note  Complaint: Follow-up for hypothyroidism  HPI  Laura [name] is a 70 y.o.-year-old female,  She has  medical history of goiter status post left hemithyroidectomy 20+ years ago, unremarkable thyroid/neck ultrasound in 2012.  She is currently on levothyroxine 100 mcg p.o. daily before breakfast.  She is compliant to her medications.  She has lost 7 pounds since last visit.    -  She denies any heat/cold intolerance, palpitations, tremors.    Pt denies feeling nodules in neck, hoarseness, dysphagia/odynophagia, SOB with lying down.  No history of radiation exposure to her head and neck.   No recent use of iodine supplements.   ROS: Constitutional: + Progressive weight loss, no fatigue, no subjective hyperthermia nor hypothermia.     PE: BP 122/74   Pulse 64   Ht 5\' 3"  (1.6 m)   Wt 183 lb 12.8 oz (83.4 kg)   BMI 32.56 kg/m  Wt Readings from Last 3 Encounters:  07/15/21 183 lb 12.8 oz (83.4 kg)  06/30/21 180 lb 1.9 oz (81.7 kg)  06/09/21 186 lb 12.8 oz (84.7 kg)    Physical Exam- Limited  Constitutional:  Body mass index is 32.56 kg/m. , not in acute distress, normal state of mind    Recent Results (from the past 2160 hour(s))  TSH     Status: Abnormal   Collection Time: 07/07/21  9:29 AM  Result Value Ref Range   TSH 0.283 (L) 0.450 - 4.500 uIU/mL  T4, free     Status: None   Collection Time: 07/07/21  9:29 AM  Result Value Ref Range   Free T4 1.52 0.82 - 1.77 ng/dL   ASSESSMENT: 1. Hypothyroidism, postsurgical  PLAN:  -Her previsit thyroid function tests are consistent with slight over replacement.  I discussed and lowered her levothyroxine to 88 mcg p.o. daily before breakfast.   - We discussed about the correct intake of her thyroid hormone, on empty stomach at fasting, with water, separated by at least 30 minutes from breakfast and other medications,  and separated by more than 4 hours from calcium, iron, multivitamins,  acid reflux medications (PPIs). -Patient is made aware of the fact that thyroid hormone replacement is needed for life, dose to be adjusted by periodic monitoring of thyroid function tests.  -Her surveillance thyroid/neck ultrasound was unremarkable in 2012.  Her recent POC  A1c is 5.4%, improving from prior measurements consistent with prediabetes. - she acknowledges that there is a room for improvement in her food and drink choices. - Suggestion is made for her to avoid simple carbohydrates  from her diet including Cakes, Sweet Desserts, Ice Cream, Soda (diet and regular), Sweet Tea, Candies, Chips, Cookies, Store Bought Juices, Alcohol in Excess of  1-2 drinks a day, Artificial Sweeteners,  Coffee Creamer, and "Sugar-free" Products, Lemonade. This will help patient to have more stable blood glucose profile and potentially avoid unintended weight gain.   -Her screening bone density from October 2019 was normal.    She is advised to maintain close follow up with her PMD for Primary care needs.    I spent 25 minutes in the care of the patient today including review of labs from Thyroid Function, CMP, and other relevant labs ; imaging/biopsy records (current and previous including abstractions from other facilities); face-to-face time discussing  her lab results and symptoms, medications doses, her options of short and long term treatment based on the latest standards of care / guidelines;   and  documenting the encounter.  [name]  participated in the discussions, expressed understanding, and voiced agreement with the above plans.  All questions were answered to her satisfaction. she is encouraged to contact clinic should she have any questions or concerns prior to her return visit.   Marquis Lunch, MD Phone: [phone]  Fax: [phone]   07/15/2021, 10:12 AM

## 2021-07-15 NOTE — Patient Instructions (Addendum)
Annual physical exam with MD first week in Jan, call if you need me before  Lipid, CBC, cmp and EGFR  No med changes  Thanks for choosing [facility], we consider it a privelige to serve you.

## 2021-07-16 ENCOUNTER — Other Ambulatory Visit: Payer: Self-pay | Admitting: Family Medicine

## 2021-07-16 LAB — CBC
Hematocrit: 35.6 % (ref 34.0–46.6)
Hemoglobin: 11.6 g/dL (ref 11.1–15.9)
MCH: 28.2 pg (ref 26.6–33.0)
MCHC: 32.6 g/dL (ref 31.5–35.7)
MCV: 87 fL (ref 79–97)
Platelets: 299 10*3/uL (ref 150–450)
RBC: 4.11 x10E6/uL (ref 3.77–5.28)
RDW: 12 % (ref 11.7–15.4)
WBC: 6.9 10*3/uL (ref 3.4–10.8)

## 2021-07-16 LAB — LIPID PANEL
Chol/HDL Ratio: 3.5 ratio (ref 0.0–4.4)
Cholesterol, Total: 232 mg/dL — ABNORMAL HIGH (ref 100–199)
HDL: 66 mg/dL (ref >39)
LDL Chol Calc (NIH): 147 mg/dL — ABNORMAL HIGH (ref 0–99)
Triglycerides: 106 mg/dL (ref 0–149)
VLDL Cholesterol Cal: 19 mg/dL (ref 5–40)

## 2021-07-16 LAB — CMP14+EGFR
ALT: 21 IU/L (ref 0–32)
AST: 19 IU/L (ref 0–40)
Albumin/Globulin Ratio: 1.6 (ref 1.2–2.2)
Albumin: 4.5 g/dL (ref 3.7–4.7)
Alkaline Phosphatase: 86 IU/L (ref 44–121)
BUN/Creatinine Ratio: 15 (ref 12–28)
BUN: 14 mg/dL (ref 8–27)
Bilirubin Total: 0.5 mg/dL (ref 0.0–1.2)
CO2: 25 mmol/L (ref 20–29)
Calcium: 9.8 mg/dL (ref 8.7–10.3)
Chloride: 98 mmol/L (ref 96–106)
Creatinine, Ser: 0.95 mg/dL (ref 0.57–1.00)
Globulin, Total: 2.9 g/dL (ref 1.5–4.5)
Glucose: 94 mg/dL (ref 65–99)
Potassium: 3.8 mmol/L (ref 3.5–5.2)
Sodium: 138 mmol/L (ref 134–144)
Total Protein: 7.4 g/dL (ref 6.0–8.5)
eGFR: 64 mL/min/{1.73_m2} (ref >59)

## 2021-07-18 ENCOUNTER — Encounter: Payer: Self-pay | Admitting: Family Medicine

## 2021-07-18 NOTE — Assessment & Plan Note (Signed)
Hyperlipidemia:Low fat diet discussed and encouraged.   Lipid Panel  Lab Results  Component Value Date   CHOL 232 (H) 07/15/2021   HDL 66 07/15/2021   LDLCALC 147 (H) 07/15/2021   TRIG 106 07/15/2021   CHOLHDL 3.5 07/15/2021   Needs to lower fat intake, fried and fatty foods

## 2021-07-18 NOTE — Assessment & Plan Note (Signed)
Controlled, no change in medication DASH diet and commitment to daily physical activity for a minimum of 30 minutes discussed and encouraged, as a part of hypertension management. The importance of attaining a healthy weight is also discussed.  BP/Weight 07/15/2021 07/15/2021 06/30/2021 06/09/2021 06/02/2021 04/22/2021 03/15/2021  Systolic BP 122 136 137 147 158 123 133  Diastolic BP 74 72 81 84 78 74 63  Wt. (Lbs) 183.8 184 180.12 186.8 186.8 183 186  BMI 32.56 32.59 31.91 33.09 33.09 32.42 32.95

## 2021-07-18 NOTE — Progress Notes (Signed)
   Laura [name]     MRN: [id number]      DOB: [birth year]   HPI Laura [name] is here for follow up and re-evaluation of chronic medical conditions, medication management and review of any available recent lab and radiology data.  Preventive health is updated, specifically  Cancer screening and Immunization.   Questions or concerns regarding consultations or procedures which the PT has had in the interim are  addressed. The PT denies any adverse reactions to current medications since the last visit.  There are no new concerns.  There are no specific complaints   ROS Denies recent fever or chills. Denies sinus pressure, nasal congestion, ear pain or sore throat. Denies chest congestion, productive cough or wheezing. Denies chest pains, palpitations and leg swelling Denies abdominal pain, nausea, vomiting,diarrhea or constipation.   Denies dysuria, frequency, hesitancy or incontinence. Denies depression, uncontrolled anxiety or insomnia. Denies skin break down or rash.   PE  BP 136/72   Pulse 68   Temp 99.1 F (37.3 C)   Resp 20   Ht 5\' 3"  (1.6 m)   Wt 184 lb (83.5 kg)   SpO2 97%   BMI 32.59 kg/m   Patient alert and oriented and in no cardiopulmonary distress.  HEENT: No facial asymmetry, EOMI,     Neck supple .  Chest: Clear to auscultation bilaterally.  CVS: S1, S2 no murmurs, no S3.Regular rate.  ABD: Soft non tender.   Ext: No edema  MS: Adequate though reduced  ROM spine, shoulders, hips and knees.  Skin: Intact, no ulcerations or rash noted.  Psych: Good eye contact, normal affect. Memory intact not anxious or depressed appearing.  CNS: CN 2-12 intact, power,  normal throughout.no focal deficits noted.   Assessment & Plan  Essential hypertension Controlled, no change in medication DASH diet and commitment to daily physical activity for a minimum of 30 minutes discussed and encouraged, as a part of hypertension management. The importance of attaining a  healthy weight is also discussed.  BP/Weight 07/15/2021 07/15/2021 06/30/2021 06/09/2021 06/02/2021 04/22/2021 03/15/2021  Systolic BP 122 136 137 147 158 123 133  Diastolic BP 74 72 81 84 78 74 63  Wt. (Lbs) 183.8 184 180.12 186.8 186.8 183 186  BMI 32.56 32.59 31.91 33.09 33.09 32.42 32.95       Back pain with radiculopathy Ongoing abn sensation in upper ext  Early am only, continue bed time gabapentin  Hyperlipidemia LDL goal <100 Hyperlipidemia:Low fat diet discussed and encouraged.   Lipid Panel  Lab Results  Component Value Date   CHOL 232 (H) 07/15/2021   HDL 66 07/15/2021   LDLCALC 147 (H) 07/15/2021   TRIG 106 07/15/2021   CHOLHDL 3.5 07/15/2021   Needs to lower fat intake, fried and fatty foods    Depression, major, single episode, severe (HCC) Controlled, no change in medication   GAD (generalized anxiety disorder) Improved and controlled

## 2021-07-18 NOTE — Assessment & Plan Note (Signed)
Improved and controlled  

## 2021-07-18 NOTE — Assessment & Plan Note (Signed)
Ongoing abn sensation in upper ext  Early am only, continue bed time gabapentin

## 2021-07-18 NOTE — Assessment & Plan Note (Signed)
Controlled, no change in medication  

## 2021-07-19 ENCOUNTER — Other Ambulatory Visit: Payer: Self-pay

## 2021-07-19 DIAGNOSIS — E890 Postprocedural hypothyroidism: Secondary | ICD-10-CM

## 2021-07-19 MED ORDER — LEVOTHYROXINE SODIUM 88 MCG PO TABS
88.0000 ug | ORAL_TABLET | Freq: Every day | ORAL | 1 refills | Status: DC
Start: 2021-07-19 — End: 2022-01-31

## 2021-07-20 ENCOUNTER — Encounter (HOSPITAL_COMMUNITY): Payer: Self-pay | Admitting: Physical Therapy

## 2021-07-20 ENCOUNTER — Other Ambulatory Visit: Payer: Self-pay

## 2021-07-20 ENCOUNTER — Ambulatory Visit (HOSPITAL_COMMUNITY): Payer: Medicare Other | Attending: Neurology | Admitting: Physical Therapy

## 2021-07-20 DIAGNOSIS — R2689 Other abnormalities of gait and mobility: Secondary | ICD-10-CM | POA: Diagnosis not present

## 2021-07-20 NOTE — Therapy (Signed)
[facility] [address] [place], [place], [address] Phone: [phone]   Fax:  [phone]  Physical Therapy Evaluation  Patient Details  Name: Laura [name] MRN: [id number] Date of Birth: 1951-02-12 Referring Provider (PT): Beryle Beams MD   Encounter Date: 07/20/2021   PT End of Session - 07/20/21 0805     Visit Number 1    Number of Visits 1    Date for PT Re-Evaluation 07/20/21    Authorization Type [employer] medicare    PT Start Time 0743    PT Stop Time 0803    PT Time Calculation (min) 20 min    Equipment Utilized During Treatment Gait belt    Activity Tolerance Patient tolerated treatment well    Behavior During Therapy [facility] for tasks assessed/performed             Past Medical History:  Diagnosis Date   Anxiety    At low risk for fall 08/15/2018   Cystitis    Depression    Gastritis    GERD (gastroesophageal reflux disease)    Hashimoto's thyroiditis    Hx    Hyperlipidemia    Hypertension    Hypothyroidism    Normocytic anemia 2009 Hb 9.9-11.1   Osteopenia    Shoulder pain    Urinary incontinence     Past Surgical History:  Procedure Laterality Date   Bilateral tubal ligation     BIOPSY  04/21/2020   Procedure: BIOPSY;  Surgeon: Corbin Ade, MD;  [facility];  Service: Endoscopy;;   BREAST EXCISIONAL BIOPSY  2010   Excisional biopsy of benign left breast mass -lopoma    BREAST SURGERY     left nreast biopsy   COLONOSCOPY  08/26/2011   SLF: 1. Internal hemorrhoids   COLONOSCOPY N/A 10/06/2015   [facility]:CWCB fissure or internal hemorrhoids/mild sigmoid colitis. benign colonic path    COLONOSCOPY WITH PROPOFOL N/A 03/15/2021   Procedure: COLONOSCOPY WITH PROPOFOL;  Surgeon: [name], DO;  [facility];  Service: Endoscopy;  Laterality: N/A;  3:00pm   colonscopy  2005   Dr. Katrinka Blazing   ESOPHAGOGASTRODUODENOSCOPY  12/2009   chronic gastritis   ESOPHAGOGASTRODUODENOSCOPY N/A  05/17/2017   Dr. Darrick Penna; gastritis, normal small bowel biopsies, multiple gastric polyps. fundic gland polyps   ESOPHAGOGASTRODUODENOSCOPY (EGD) WITH PROPOFOL N/A 04/21/2020   Rourk: multiple gastric polyps, biopsy of one c/w fundic gland. gastric biopsy showed mild chronic gastritis but no H.pylori   GIVENS CAPSULE STUDY N/A 05/30/2017   gastritis, no source for anemia identified   left knee surgery Left March 1,2017   PARTIAL KNEE ARTHROPLASTY Left 08/09/2017   Procedure: LEFT UNICOMPARTMENTAL KNEE;  Surgeon: Ollen Gross, MD;  Location: [facility];  Service: Orthopedics;  Laterality: Left;   POLYPECTOMY  04/21/2020   Procedure: POLYPECTOMY;  Surgeon: Corbin Ade, MD;  [facility];  Service: Endoscopy;;  gastric   Resection of left lobe of thyroid     right carpal tunnel release     rt. knee athroscopy  2004   SAVORY DILATION N/A 05/17/2017   Procedure: SAVORY DILATION;  Surgeon: West Bali, MD;  [facility];  Service: Endoscopy;  Laterality: N/A;   TOTAL ABDOMINAL HYSTERECTOMY  1994   UMBILICAL HERNIA REPAIR     Urethral dilation for stenosis  2009    There were no vitals filed for this visit.    Subjective Assessment - 07/20/21 7628  Subjective Patient is a 70 y.o. female who presents to physical therapy with referral for gait and mobility abnormalities. Patient states she was bumping into things about last month. She has not had any falls. She denies dizziness or changes in symptoms with position changes. She drops things a lot. Not having any pain. Symptoms go and come with bumping into things. Her goal is to see whats going on.    Limitations Walking;House hold activities    Patient Stated Goals figure out whats going on    Currently in Pain? No/denies                [facility] PT Assessment - 07/20/21 0001       Assessment   Medical Diagnosis other abnormalities of gait and mobility    Referring Provider (PT) Beryle Beams MD    Onset  Date/Surgical Date 06/19/21    Next MD Visit October    Prior Therapy yes      Precautions   Precautions None      Restrictions   Weight Bearing Restrictions No      Balance Screen   Has the patient fallen in the past 6 months No    Has the patient had a decrease in activity level because of a fear of falling?  No    Is the patient reluctant to leave their home because of a fear of falling?  No      Prior Function   Level of Independence Independent    Vocation Retired      Copy Status Within Functional Limits for tasks assessed      Observation/Other Assessments   Observations ambulates without AD    Focus on Therapeutic Outcomes (FOTO)  n/a      ROM / Strength   AROM / PROM / Strength AROM;Strength      Strength   Strength Assessment Site Hip;Knee;Ankle    Right/Left Hip Right;Left    Right Hip Flexion 4+/5    Left Hip Flexion 4+/5    Right/Left Knee Right;Left    Right Knee Flexion 5/5    Right Knee Extension 5/5    Left Knee Flexion 5/5    Left Knee Extension 5/5    Right/Left Ankle Right;Left    Right Ankle Dorsiflexion 5/5    Left Ankle Dorsiflexion 5/5      Transfers   Comments without UE use      Ambulation/Gait   Ambulation/Gait Yes    Ambulation/Gait Assistance 7: Independent    Ambulation Distance (Feet) 500 Feet    Assistive device None    Gait Pattern Within Functional Limits    Gait Comments      Standardized Balance Assessment   Standardized Balance Assessment Dynamic Gait Index      Dynamic Gait Index   Level Surface Normal    Change in Gait Speed Normal    Gait with Horizontal Head Turns Normal    Gait with Vertical Head Turns Normal    Gait and Pivot Turn Normal    Step Over Obstacle Normal    Step Around Obstacles Normal    Steps Normal    Total Score 24                        Objective measurements completed on examination: See above findings.                PT  Education - 07/20/21 1610  Education Details Patient educated on exam findings, POC, scope of PT, Returning to PT if needed, compression garments for LE edema    Person(s) Educated Patient    Methods Explanation    Comprehension Verbalized understanding              PT Short Term Goals - 07/20/21 6712       PT SHORT TERM GOAL #1   Title Patient will be educated on exam findings.    Time 1    Period Days    Status Achieved    Target Date 07/20/21                       Plan - 07/20/21 0805     Clinical Impression Statement Patient is a 70 y.o. female who presents to physical therapy with referral for gait and mobility abnormalities. Patient stating deficit with bumping into things at home. Patient demonstrating good LE strength, gait, and static and dynamic balance without deficit. Patient educated on returning to therapy if needed and on wearing compression garments for LE edema. Patient does not require additional PT services at this time.    Personal Factors and Comorbidities Comorbidity 2    Comorbidities HTN, HLD    Examination-Activity Limitations Locomotion Level;Stairs    Examination-Participation Restrictions Brewing technologist;Shop    Stability/Clinical Decision Making Stable/Uncomplicated    Clinical Decision Making Low    Rehab Potential Excellent    PT Frequency One time visit    PT Treatment/Interventions ADLs/Self Care Home Management;Gait training;Stair training;Functional mobility training;Therapeutic activities;Therapeutic exercise;Balance training;DME Instruction;Neuromuscular re-education;Patient/family education    PT Next Visit Plan n/a    Consulted and Agree with Plan of Care Patient             Patient will benefit from skilled therapeutic intervention in order to improve the following deficits and impairments:  Difficulty walking, Decreased balance  Visit Diagnosis: Other abnormalities of gait and mobility     Problem  List Patient Active Problem List   Diagnosis Date Noted   Neck pain 07/05/2021   Unsteady gait when walking 06/02/2021   Numbness and tingling in both hands 06/02/2021   Thumb pain, left 04/22/2021   Flushing 10/19/2020   Depression, major, single episode, severe (HCC) 06/21/2020   Hot flashes 03/11/2020   Vitamin D deficiency 03/11/2020   Sensation of feeling hot 03/05/2020   Lumbar spine painful on movement 12/02/2019   Obesity (BMI 30.0-34.9) 06/17/2019   Lesion of throat 07/12/2018   History of arthroplasty of left knee 01/04/2018   OA (osteoarthritis) of knee 08/09/2017   Epigastric pain 03/02/2017   Headache 01/06/2017   Allergic rhinitis 10/08/2014   GAD (generalized anxiety disorder) 09/23/2014   Back pain with radiculopathy 04/14/2014   Right shoulder pain 12/19/2011   POSTSURGICAL HYPOTHYROIDISM 06/22/2010   Normocytic anemia 08/04/2009   Gastroesophageal reflux disease 04/20/2009   Hyperlipidemia LDL goal <100 05/16/2008   Essential hypertension 05/16/2008   OSTEOPENIA 05/16/2008   Urinary incontinence 05/16/2008    8:09 AM, 07/20/21 Wyman Songster PT, DPT Physical Therapist at [facility]   [facility] [address] [place], [place], [address] Phone: [phone]   Fax:  [phone]  Name: Laura [name] MRN: [id number] Date of Birth: [birth year]

## 2021-07-31 ENCOUNTER — Encounter (HOSPITAL_COMMUNITY): Payer: Self-pay

## 2021-07-31 ENCOUNTER — Other Ambulatory Visit: Payer: Self-pay

## 2021-07-31 ENCOUNTER — Emergency Department (HOSPITAL_COMMUNITY)
Admission: EM | Admit: 2021-07-31 | Discharge: 2021-07-31 | Disposition: A | Discharge status: Home / Self Care | Payer: Medicare Other | Attending: Emergency Medicine | Admitting: Emergency Medicine

## 2021-07-31 DIAGNOSIS — Z87891 Personal history of nicotine dependence: Secondary | ICD-10-CM | POA: Insufficient documentation

## 2021-07-31 DIAGNOSIS — J029 Acute pharyngitis, unspecified: Secondary | ICD-10-CM | POA: Insufficient documentation

## 2021-07-31 DIAGNOSIS — Z79899 Other long term (current) drug therapy: Secondary | ICD-10-CM | POA: Diagnosis not present

## 2021-07-31 DIAGNOSIS — E039 Hypothyroidism, unspecified: Secondary | ICD-10-CM | POA: Insufficient documentation

## 2021-07-31 DIAGNOSIS — Z20822 Contact with and (suspected) exposure to covid-19: Secondary | ICD-10-CM | POA: Diagnosis not present

## 2021-07-31 DIAGNOSIS — I10 Essential (primary) hypertension: Secondary | ICD-10-CM | POA: Insufficient documentation

## 2021-07-31 DIAGNOSIS — Z96652 Presence of left artificial knee joint: Secondary | ICD-10-CM | POA: Insufficient documentation

## 2021-07-31 LAB — GROUP A STREP BY PCR: Group A Strep by PCR: NOT DETECTED

## 2021-07-31 LAB — RESP PANEL BY RT-PCR (FLU A&B, COVID) ARPGX2
Influenza A by PCR: NEGATIVE
Influenza B by PCR: NEGATIVE
SARS Coronavirus 2 by RT PCR: NEGATIVE

## 2021-07-31 NOTE — ED Provider Notes (Signed)
[facility] EMERGENCY DEPARTMENT Provider Note  CSN: [id number] Arrival date & time: 07/31/21 6712    History Chief Complaint  Patient presents with  . Sore Throat    Laura [name] is a 70 y.o. female reports she was in her usual state of health yesterday. She woke up this morning with mild-moderate sore throat, worse with swallowing. Subjective fever at home. No cough. Brief nosebleed at 6am today.    Past Medical History:  Diagnosis Date  . Anxiety   . At low risk for fall 08/15/2018  . Cystitis   . Depression   . Gastritis   . GERD (gastroesophageal reflux disease)   . Hashimoto's thyroiditis    Hx   . Hyperlipidemia   . Hypertension   . Hypothyroidism   . Normocytic anemia 2009 Hb 9.9-11.1  . Osteopenia   . Shoulder pain   . Urinary incontinence     Past Surgical History:  Procedure Laterality Date  . Bilateral tubal ligation    . BIOPSY  04/21/2020   Procedure: BIOPSY;  Surgeon: Corbin Ade, MD;  [facility];  Service: Endoscopy;;  . BREAST EXCISIONAL BIOPSY  2010   Excisional biopsy of benign left breast mass -lopoma   . BREAST SURGERY     left nreast biopsy  . COLONOSCOPY  08/26/2011   SLF: 1. Internal hemorrhoids  . COLONOSCOPY N/A 10/06/2015   [facility]:[id number] fissure or internal hemorrhoids/mild sigmoid colitis. benign colonic path   . COLONOSCOPY WITH PROPOFOL N/A 03/15/2021   Procedure: COLONOSCOPY WITH PROPOFOL;  Surgeon: [name], DO;  [facility];  Service: Endoscopy;  Laterality: N/A;  3:00pm  . colonscopy  2005   Dr. Katrinka Blazing  . ESOPHAGOGASTRODUODENOSCOPY  12/2009   chronic gastritis  . ESOPHAGOGASTRODUODENOSCOPY N/A 05/17/2017   Dr. Darrick Penna; gastritis, normal small bowel biopsies, multiple gastric polyps. fundic gland polyps  . ESOPHAGOGASTRODUODENOSCOPY (EGD) WITH PROPOFOL N/A 04/21/2020   Rourk: multiple gastric polyps, biopsy of one c/w fundic gland. gastric biopsy showed mild chronic gastritis but no H.pylori  .  GIVENS CAPSULE STUDY N/A 05/30/2017   gastritis, no source for anemia identified  . left knee surgery Left March 1,2017  . PARTIAL KNEE ARTHROPLASTY Left 08/09/2017   Procedure: LEFT UNICOMPARTMENTAL KNEE;  Surgeon: Ollen Gross, MD;  [facility];  Service: Orthopedics;  Laterality: Left;  . POLYPECTOMY  04/21/2020   Procedure: POLYPECTOMY;  Surgeon: Corbin Ade, MD;  [facility];  Service: Endoscopy;;  gastric  . Resection of left lobe of thyroid    . right carpal tunnel release    . rt. knee athroscopy  2004  . SAVORY DILATION N/A 05/17/2017   Procedure: SAVORY DILATION;  Surgeon: West Bali, MD;  [facility];  Service: Endoscopy;  Laterality: N/A;  . TOTAL ABDOMINAL HYSTERECTOMY  1994  . UMBILICAL HERNIA REPAIR    . Urethral dilation for stenosis  2009    Family History  Problem Relation Age of Onset  . Pneumonia Mother   . Healthy Father   . Colon cancer Neg Hx   . Anesthesia problems Neg Hx   . Hypotension Neg Hx   . Malignant hyperthermia Neg Hx   . Pseudochol deficiency Neg Hx     Social History   Tobacco Use  . Smoking status: Former    Packs/day: 0.25    Years: 1.00    Pack years: 0.25    Types: Cigarettes    Quit date:  08/25/1972    Years since quitting: 48.9  . Smokeless tobacco: Never  Vaping Use  . Vaping Use: Never used  Substance Use Topics  . Alcohol use: No  . Drug use: No     Home Medications Prior to Admission medications   Medication Sig Start Date End Date Taking? Authorizing Provider  acetaminophen (TYLENOL) 500 MG tablet Take 1 tablet (500 mg total) by mouth every 6 (six) hours as needed. Patient taking differently: Take 1,500 mg by mouth daily as needed for mild pain or headache. 11/15/19   Avegno, Zachery Dakins, FNP  amLODipine (NORVASC) 2.5 MG tablet TAKE 1 TABLET(2.5 MG) BY MOUTH DAILY 05/10/21   Kerri Perches, MD  atorvastatin (LIPITOR) 80 MG tablet TAKE 1 TABLET BY MOUTH  DAILY Patient taking  differently: Take 80 mg by mouth daily. 12/21/20   Kerri Perches, MD  citalopram (CELEXA) 40 MG tablet TAKE 1 TABLET BY MOUTH  DAILY 06/04/21   Kerri Perches, MD  gabapentin (NEURONTIN) 100 MG capsule TAKE 1 CAPSULE BY MOUTH AT  BEDTIME 07/16/21   Kerri Perches, MD  hydrochlorothiazide (HYDRODIURIL) 25 MG tablet TAKE 1 TABLET BY MOUTH  DAILY 07/16/21   Kerri Perches, MD  Lactobacillus Rhamnosus, GG, (CULTURELLE PO) Take by mouth daily.    [provider]  levothyroxine (SYNTHROID) 88 MCG tablet Take 1 tablet (88 mcg total) by mouth daily before breakfast. 07/19/21   Nida, Denman George, MD  linaclotide (LINZESS) 72 MCG capsule Take 1 capsule (72 mcg total) by mouth daily before breakfast. Patient taking differently: Take 72 mcg by mouth daily as needed (constipation.). 09/27/17   Gelene Mink, NP  lisinopril (ZESTRIL) 40 MG tablet Take 40 mg by mouth daily.    [provider]  methocarbamol (ROBAXIN) 500 MG tablet Take 500 mg by mouth 3 (three) times daily.    [provider]  montelukast (SINGULAIR) 10 MG tablet TAKE 1 TABLET(10 MG) BY MOUTH AT BEDTIME 03/09/21   Kerri Perches, MD  Multiple Vitamin (MULTIVITAMIN WITH MINERALS) TABS tablet Take 1 tablet by mouth daily.    [provider]  omeprazole (PRILOSEC) 20 MG capsule Take 20 mg by mouth 2 (two) times daily.    [provider]  oxybutynin (DITROPAN XL) 10 MG 24 hr tablet Once daily as needed 05/18/21   Kerri Perches, MD  Polyethyl Glycol-Propyl Glycol (SYSTANE OP) Place 1 drop into both eyes daily as needed (dry eyes).    [provider]  Potassium 99 MG TABS Take 99 mg by mouth 2 (two) times daily.    [provider]  traMADol (ULTRAM) 50 MG tablet Take 50 mg by mouth every 8 (eight) hours as needed for moderate pain.    [provider]     Allergies    Cozaar [losartan potassium], Dilaudid [hydromorphone hcl], Levaquin [levofloxacin],  Nsaids, and Promethazine   Review of Systems   Review of Systems A comprehensive review of systems was completed and negative except as noted in HPI.    Physical Exam BP (!) 158/78 (BP Location: Left Arm)   Pulse 66   Temp 98.7 F (37.1 C) (Oral)   Resp 18   Ht 5\' 3"  (1.6 m)   Wt 84.4 kg   SpO2 95%   BMI 32.95 kg/m   Physical Exam Vitals and nursing note reviewed.  Constitutional:      Appearance: Normal appearance.  HENT:     Head: Normocephalic and atraumatic.  Nose: Nose normal.     Mouth/Throat:     Mouth: Mucous membranes are moist.     Pharynx: Posterior oropharyngeal erythema present. No oropharyngeal exudate.  Eyes:     Extraocular Movements: Extraocular movements intact.     Conjunctiva/sclera: Conjunctivae normal.  Cardiovascular:     Rate and Rhythm: Normal rate.  Pulmonary:     Effort: Pulmonary effort is normal.     Breath sounds: Normal breath sounds.  Abdominal:     General: Abdomen is flat.     Palpations: Abdomen is soft.     Tenderness: There is no abdominal tenderness.  Musculoskeletal:        General: No swelling. Normal range of motion.     Cervical back: Neck supple.  Lymphadenopathy:     Cervical: No cervical adenopathy.  Skin:    General: Skin is warm and dry.  Neurological:     General: No focal deficit present.     Mental Status: She is alert.  Psychiatric:        Mood and Affect: Mood normal.     ED Results / Procedures / Treatments   Labs (all labs ordered are listed, but only abnormal results are displayed) Labs Reviewed  GROUP A STREP BY PCR  RESP PANEL BY RT-PCR (FLU A&B, COVID) [id number]    EKG None  Radiology No results found.  Procedures Procedures  Medications Ordered in the ED Medications - No data to display   MDM Rules/Calculators/A&P MDM Check Covid and strep swabs.   ED Course  I have reviewed the triage vital signs and the nursing notes.  Pertinent labs & imaging results that were available  during my care of the patient were reviewed by me and considered in my medical decision making (see chart for details).  Clinical Course as of 07/31/21 1501  Sat Jul 31, 2021  1610 Strep is neg.  [CS]  0949 Covid/Flu neg. Plan discharge with symptomatic OTC management.  [CS]    Clinical Course User Index [CS] Pollyann Savoy, MD    Final Clinical Impression(s) / ED Diagnoses Final diagnoses:  None    Rx / DC Orders ED Discharge Orders     None        Pollyann Savoy, MD 07/31/21 1501

## 2021-07-31 NOTE — ED Notes (Signed)
Patient given 2 warm blankets.  

## 2021-07-31 NOTE — ED Triage Notes (Signed)
Pt presents to ED with complaints of sore throat started this morning.

## 2021-08-02 ENCOUNTER — Other Ambulatory Visit: Payer: Self-pay

## 2021-08-02 ENCOUNTER — Other Ambulatory Visit: Payer: Self-pay | Admitting: Internal Medicine

## 2021-08-02 ENCOUNTER — Ambulatory Visit (INDEPENDENT_AMBULATORY_CARE_PROVIDER_SITE_OTHER): Payer: Medicare Other | Admitting: Nurse Practitioner

## 2021-08-02 ENCOUNTER — Encounter: Payer: Self-pay | Admitting: Nurse Practitioner

## 2021-08-02 VITALS — BP 146/83 | HR 75 | Temp 99.7°F | Ht 63.0 in | Wt 183.0 lb

## 2021-08-02 DIAGNOSIS — J029 Acute pharyngitis, unspecified: Secondary | ICD-10-CM | POA: Diagnosis not present

## 2021-08-02 MED ORDER — METHYLPREDNISOLONE ACETATE 80 MG/ML IJ SUSP
80.0000 mg | Freq: Once | INTRAMUSCULAR | Status: AC
  Administered 2021-08-02: 80 mg via INTRAMUSCULAR

## 2021-08-02 NOTE — Progress Notes (Signed)
Acute Office Visit  Subjective:    Patient ID: Laura [name], female    DOB: Aug 22, 1951, 70 y.o.   MRN: [id number]  Chief Complaint  Patient presents with   Sore Throat    Red throat dosents hurt went to ER on 08/01/21 negative for covid and strep      Sore Throat   Patient is in today for sore throat. She was seen in ED 2 days ago and was negative for COVID/flu/strep. Symptoms started 2 days ago.  Past Medical History:  Diagnosis Date   Anxiety    At low risk for fall 08/15/2018   Cystitis    Depression    Gastritis    GERD (gastroesophageal reflux disease)    Hashimoto's thyroiditis    Hx    Hyperlipidemia    Hypertension    Hypothyroidism    Normocytic anemia 2009 Hb 9.9-11.1   Osteopenia    Shoulder pain    Urinary incontinence     Past Surgical History:  Procedure Laterality Date   Bilateral tubal ligation     BIOPSY  04/21/2020   Procedure: BIOPSY;  Surgeon: Daneil Dolin, MD;  [facility]: [facility];  Service: Endoscopy;;   BREAST EXCISIONAL BIOPSY  2010   Excisional biopsy of benign left breast mass -lopoma    BREAST SURGERY     left nreast biopsy   COLONOSCOPY  08/26/2011   SLF: 1. Internal hemorrhoids   COLONOSCOPY N/A 10/06/2015   [id number]:[id number] fissure or internal hemorrhoids/mild sigmoid colitis. benign colonic path    COLONOSCOPY WITH PROPOFOL N/A 03/15/2021   Procedure: COLONOSCOPY WITH PROPOFOL;  Surgeon: Eloise Harman, DO;  [facility];  Service: Endoscopy;  Laterality: N/A;  3:00pm   colonscopy  2005   Dr. Tamala Julian   ESOPHAGOGASTRODUODENOSCOPY  12/2009   chronic gastritis   ESOPHAGOGASTRODUODENOSCOPY N/A 05/17/2017   Dr. Oneida Alar; gastritis, normal small bowel biopsies, multiple gastric polyps. fundic gland polyps   ESOPHAGOGASTRODUODENOSCOPY (EGD) WITH PROPOFOL N/A 04/21/2020   Rourk: multiple gastric polyps, biopsy of one c/w fundic gland. gastric biopsy showed mild chronic gastritis but no H.pylori   GIVENS CAPSULE STUDY N/A  05/30/2017   gastritis, no source for anemia identified   left knee surgery Left March 1,2017   PARTIAL KNEE ARTHROPLASTY Left 08/09/2017   Procedure: LEFT UNICOMPARTMENTAL KNEE;  Surgeon: Gaynelle Arabian, MD;  Location: [facility];  Service: Orthopedics;  Laterality: Left;   POLYPECTOMY  04/21/2020   Procedure: POLYPECTOMY;  Surgeon: Daneil Dolin, MD;  [facility];  Service: Endoscopy;;  gastric   Resection of left lobe of thyroid     right carpal tunnel release     rt. knee athroscopy  2004   SAVORY DILATION N/A 05/17/2017   Procedure: SAVORY DILATION;  Surgeon: Danie Binder, MD;  [facility];  Service: Endoscopy;  Laterality: N/A;   TOTAL ABDOMINAL HYSTERECTOMY  3536   UMBILICAL HERNIA REPAIR     Urethral dilation for stenosis  2009    Family History  Problem Relation Age of Onset   Pneumonia Mother    Healthy Father    Colon cancer Neg Hx    Anesthesia problems Neg Hx    Hypotension Neg Hx    Malignant hyperthermia Neg Hx    Pseudochol deficiency Neg Hx     Social History   Socioeconomic History   Marital status: Married    Spouse name: Not on file   Number of children: 3  Years of education: Not on file   Highest education level: Not on file  Occupational History   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   Tobacco Use   Smoking status: Former    Packs/day: 0.25    Years: 1.00    Pack years: 0.25    Types: Cigarettes    Quit date: 08/25/1972    Years since quitting: 48.9   Smokeless tobacco: Never  Vaping Use   Vaping Use: Never used  Substance and Sexual Activity   Alcohol use: No   Drug use: No   Sexual activity: Not Currently    Birth control/protection: Surgical    Comment: hyst  Other Topics Concern   Not on file  Social History Narrative   Not on file   Social Determinants of Health   Financial Resource Strain: Low Risk    Difficulty of Paying Living Expenses: Not hard at all  Food Insecurity: No Food  Insecurity   Worried About Charity fundraiser in the Last Year: Never true   [place] in the Last Year: Never true  Transportation Needs: No Transportation Needs   Lack of Transportation (Medical): No   Lack of Transportation (Non-Medical): No  Physical Activity: Inactive   Days of Exercise per Week: 1 day   Minutes of Exercise per Session: 0 min  Stress: No Stress Concern Present   Feeling of Stress : Not at all  Social Connections: Socially Integrated   Frequency of Communication with Friends and Family: Twice a week   Frequency of Social Gatherings with Friends and Family: Twice a week   Attends Religious Services: 1 to 4 times per year   Active Member of [employer] or Organizations: Yes   Attends Archivist Meetings: 1 to 4 times per year   Marital Status: Married  Human resources officer Violence: Not At Risk   Fear of Current or Ex-Partner: No   Emotionally Abused: No   Physically Abused: No   Sexually Abused: No    Outpatient Medications Prior to Visit  Medication Sig Dispense Refill   acetaminophen (TYLENOL) 500 MG tablet Take 1 tablet (500 mg total) by mouth every 6 (six) hours as needed. (Patient taking differently: Take 1,500 mg by mouth daily as needed for mild pain or headache.) 30 tablet 0   amLODipine (NORVASC) 2.5 MG tablet TAKE 1 TABLET(2.5 MG) BY MOUTH DAILY 90 tablet 1   atorvastatin (LIPITOR) 80 MG tablet TAKE 1 TABLET BY MOUTH  DAILY (Patient taking differently: Take 80 mg by mouth daily.) 90 tablet 3   citalopram (CELEXA) 40 MG tablet TAKE 1 TABLET BY MOUTH  DAILY 30 tablet 11   gabapentin (NEURONTIN) 100 MG capsule TAKE 1 CAPSULE BY MOUTH AT  BEDTIME 90 capsule 3   hydrochlorothiazide (HYDRODIURIL) 25 MG tablet TAKE 1 TABLET BY MOUTH  DAILY 90 tablet 3   Lactobacillus Rhamnosus, GG, (CULTURELLE PO) Take by mouth daily.     levothyroxine (SYNTHROID) 88 MCG tablet Take 1 tablet (88 mcg total) by mouth daily before breakfast. 90 tablet 1   linaclotide  (LINZESS) 72 MCG capsule Take 1 capsule (72 mcg total) by mouth daily before breakfast. (Patient taking differently: Take 72 mcg by mouth daily as needed (constipation.).) 90 capsule 3   lisinopril (ZESTRIL) 40 MG tablet Take 40 mg by mouth daily.     methocarbamol (ROBAXIN) 500 MG tablet Take 500 mg by mouth 3 (three) times daily.     montelukast (SINGULAIR) 10 MG  tablet TAKE 1 TABLET(10 MG) BY MOUTH AT BEDTIME 30 tablet 3   Multiple Vitamin (MULTIVITAMIN WITH MINERALS) TABS tablet Take 1 tablet by mouth daily.     omeprazole (PRILOSEC) 20 MG capsule Take 20 mg by mouth 2 (two) times daily.     oxybutynin (DITROPAN XL) 10 MG 24 hr tablet Once daily as needed 10 tablet 1   Polyethyl Glycol-Propyl Glycol (SYSTANE OP) Place 1 drop into both eyes daily as needed (dry eyes).     Potassium 99 MG TABS Take 99 mg by mouth 2 (two) times daily.     traMADol (ULTRAM) 50 MG tablet Take 50 mg by mouth every 8 (eight) hours as needed for moderate pain.     No facility-administered medications prior to visit.    Allergies  Allergen Reactions   Cozaar [Losartan Potassium] Cough   Dilaudid [Hydromorphone Hcl] Other (See Comments)    Confusion;altered state of mind   Levaquin [Levofloxacin] Other (See Comments)    Dry throat   Nsaids Other (See Comments)    per GI pt no longer to use NSAIDS    Promethazine     Legs itching, RESTLESS LEGS    Review of Systems  Constitutional: Negative.   HENT:  Positive for sore throat.   Respiratory: Negative.    Cardiovascular: Negative.       Objective:    Physical Exam Constitutional:      Appearance: Normal appearance.  HENT:     Mouth/Throat:     Mouth: Mucous membranes are moist.     Pharynx: Oropharynx is clear. Posterior oropharyngeal erythema present.  Cardiovascular:     Rate and Rhythm: Normal rate and regular rhythm.     Pulses: Normal pulses.     Heart sounds: Normal heart sounds.  Pulmonary:     Effort: Pulmonary effort is normal.      Breath sounds: Normal breath sounds.  Musculoskeletal:     Cervical back: Normal range of motion and neck supple.  Neurological:     Mental Status: She is alert.    BP (!) 146/83 (BP Location: Right Arm, Patient Position: Sitting, Cuff Size: Large)   Pulse 75   Temp 99.7 F (37.6 C) (Oral)   Ht _0  (1.6 m)   Wt 183 lb 0.6 oz (83 kg)   SpO2 96%   BMI 32.42 kg/m  Wt Readings from Last 3 Encounters:  08/02/21 183 lb 0.6 oz (83 kg)  07/31/21 186 lb (84.4 kg)  07/15/21 184 lb (83.5 kg)    There are no preventive care reminders to display for this patient.  There are no preventive care reminders to display for this patient.   Lab Results  Component Value Date   TSH 0.283 (L) 07/07/2021   Lab Results  Component Value Date   WBC 6.9 07/15/2021   HGB 11.6 07/15/2021   HCT 35.6 07/15/2021   MCV 87 07/15/2021   PLT 299 07/15/2021   Lab Results  Component Value Date   NA 138 07/15/2021   K 3.8 07/15/2021   CO2 25 07/15/2021   GLUCOSE 94 07/15/2021   BUN 14 07/15/2021   CREATININE 0.95 07/15/2021   BILITOT 0.5 07/15/2021   ALKPHOS 86 07/15/2021   AST 19 07/15/2021   ALT 21 07/15/2021   PROT 7.4 07/15/2021   ALBUMIN 4.5 07/15/2021   CALCIUM 9.8 07/15/2021   ANIONGAP 8 03/12/2021   EGFR 64 07/15/2021   Lab Results  Component Value Date   CHOL  232 (H) 07/15/2021   Lab Results  Component Value Date   HDL 66 07/15/2021   Lab Results  Component Value Date   LDLCALC 147 (H) 07/15/2021   Lab Results  Component Value Date   TRIG 106 07/15/2021   Lab Results  Component Value Date   CHOLHDL 3.5 07/15/2021   Lab Results  Component Value Date   HGBA1C 5.4 01/12/2021       Assessment & Plan:   Problem List Items Addressed This Visit       Other   Sore throat    -went to ED 2 days ago, but covid/flu/strep were negative -throat is still mildly sore and slightly erythematous -IM depomedrol today -if no improvement by Friday, will consider z-pack         No orders of the defined types were placed in this encounter.    Noreene Larsson, NP

## 2021-08-02 NOTE — Patient Instructions (Signed)
If no improvement by Thurdsday evening/Friday morning, call the office and we'll consider sending in an antibiotic. I think the steroid shot today will help thought!

## 2021-08-02 NOTE — Assessment & Plan Note (Signed)
-  went to ED 2 days ago, but covid/flu/strep were negative -throat is still mildly sore and slightly erythematous -IM depomedrol today -if no improvement by Friday, will consider z-pack

## 2021-08-02 NOTE — Addendum Note (Signed)
Addended by: Jasper Riling on: 08/02/2021 09:48 AM   Modules accepted: Orders

## 2021-08-05 ENCOUNTER — Other Ambulatory Visit: Payer: Self-pay | Admitting: Family Medicine

## 2021-08-05 ENCOUNTER — Encounter: Payer: Self-pay | Admitting: Family Medicine

## 2021-08-05 MED ORDER — PREDNISONE 5 MG PO TABS: 5.0000 mg | ORAL_TABLET | Freq: Two times a day (BID) | ORAL | 0 refills | Status: AC

## 2021-08-05 NOTE — Progress Notes (Signed)
D5

## 2021-08-10 ENCOUNTER — Telehealth: Payer: Medicare Other

## 2021-08-11 ENCOUNTER — Telehealth: Payer: Medicare Other | Admitting: Family Medicine

## 2021-08-12 DIAGNOSIS — G5602 Carpal tunnel syndrome, left upper limb: Secondary | ICD-10-CM | POA: Diagnosis not present

## 2021-08-12 DIAGNOSIS — G5601 Carpal tunnel syndrome, right upper limb: Secondary | ICD-10-CM | POA: Diagnosis not present

## 2021-08-16 DIAGNOSIS — M5416 Radiculopathy, lumbar region: Secondary | ICD-10-CM | POA: Diagnosis not present

## 2021-08-17 ENCOUNTER — Other Ambulatory Visit: Payer: Self-pay | Admitting: Family Medicine

## 2021-08-18 ENCOUNTER — Other Ambulatory Visit: Payer: Self-pay | Admitting: Family Medicine

## 2021-08-23 DIAGNOSIS — G5602 Carpal tunnel syndrome, left upper limb: Secondary | ICD-10-CM | POA: Diagnosis not present

## 2021-08-23 DIAGNOSIS — I6930 Unspecified sequelae of cerebral infarction: Secondary | ICD-10-CM | POA: Diagnosis not present

## 2021-08-23 DIAGNOSIS — R269 Unspecified abnormalities of gait and mobility: Secondary | ICD-10-CM | POA: Diagnosis not present

## 2021-08-23 DIAGNOSIS — E785 Hyperlipidemia, unspecified: Secondary | ICD-10-CM | POA: Diagnosis not present

## 2021-08-23 DIAGNOSIS — I10 Essential (primary) hypertension: Secondary | ICD-10-CM | POA: Diagnosis not present

## 2021-08-30 ENCOUNTER — Other Ambulatory Visit: Payer: Self-pay

## 2021-08-30 ENCOUNTER — Ambulatory Visit (INDEPENDENT_AMBULATORY_CARE_PROVIDER_SITE_OTHER): Payer: Medicare Other

## 2021-08-30 VITALS — BP 147/85 | HR 75 | Temp 98.4°F | Ht 63.0 in | Wt 189.1 lb

## 2021-08-30 DIAGNOSIS — Z0000 Encounter for general adult medical examination without abnormal findings: Secondary | ICD-10-CM | POA: Diagnosis not present

## 2021-08-30 NOTE — Progress Notes (Signed)
Subjective:   Laura [name] is a 70 y.o. female who presents for Medicare Annual (Subsequent) preventive examination.  Review of Systems           Objective:    There were no vitals filed for this visit. There is no height or weight on file to calculate BMI.  Advanced Directives 07/31/2021 07/20/2021 03/15/2021 04/21/2020 04/20/2020 07/30/2018 01/16/2018  Does Patient Have a Medical Advance Directive? No No No No No No No  Would patient like information on creating a medical advance directive? - Yes (MAU/Ambulatory/Procedural Areas - Information given) Yes (MAU/Ambulatory/Procedural Areas - Information given) - No - Patient declined Yes (ED - Information included in AVS) No - Patient declined  Pre-existing out of facility DNR order (yellow form or pink MOST form) - - - - - - -    Current Medications (verified) Outpatient Encounter Medications as of 08/30/2021  Medication Sig   acetaminophen (TYLENOL) 500 MG tablet Take 1 tablet (500 mg total) by mouth every 6 (six) hours as needed. (Patient taking differently: Take 1,500 mg by mouth daily as needed for mild pain or headache.)   amLODipine (NORVASC) 2.5 MG tablet TAKE 1 TABLET(2.5 MG) BY MOUTH DAILY   atorvastatin (LIPITOR) 80 MG tablet TAKE 1 TABLET BY MOUTH  DAILY (Patient taking differently: Take 80 mg by mouth daily.)   citalopram (CELEXA) 40 MG tablet TAKE 1 TABLET BY MOUTH  DAILY   gabapentin (NEURONTIN) 100 MG capsule TAKE 1 CAPSULE BY MOUTH AT  BEDTIME   hydrochlorothiazide (HYDRODIURIL) 25 MG tablet TAKE 1 TABLET BY MOUTH  DAILY   Lactobacillus Rhamnosus, GG, (CULTURELLE PO) Take by mouth daily.   levothyroxine (SYNTHROID) 88 MCG tablet Take 1 tablet (88 mcg total) by mouth daily before breakfast.   linaclotide (LINZESS) 72 MCG capsule Take 1 capsule (72 mcg total) by mouth daily before breakfast. (Patient taking differently: Take 72 mcg by mouth daily as needed (constipation.).)   lisinopril (ZESTRIL) 40 MG tablet Take 40 mg by  mouth daily.   methocarbamol (ROBAXIN) 500 MG tablet Take 500 mg by mouth 3 (three) times daily.   montelukast (SINGULAIR) 10 MG tablet TAKE 1 TABLET BY MOUTH AT  BEDTIME   Multiple Vitamin (MULTIVITAMIN WITH MINERALS) TABS tablet Take 1 tablet by mouth daily.   omeprazole (PRILOSEC) 20 MG capsule TAKE 1 CAPSULE(20 MG) BY MOUTH TWICE DAILY BEFORE A MEAL   oxybutynin (DITROPAN-XL) 10 MG 24 hr tablet TAKE 1 TABLET BY MOUTH ONCE DAILY AS NEEDED   Polyethyl Glycol-Propyl Glycol (SYSTANE OP) Place 1 drop into both eyes daily as needed (dry eyes).   Potassium 99 MG TABS Take 99 mg by mouth 2 (two) times daily.   traMADol (ULTRAM) 50 MG tablet Take 50 mg by mouth every 8 (eight) hours as needed for moderate pain.   No facility-administered encounter medications on file as of 08/30/2021.    Allergies (verified) Cozaar [losartan potassium], Dilaudid [hydromorphone hcl], Levaquin [levofloxacin], Nsaids, and Promethazine   History: Past Medical History:  Diagnosis Date   Anxiety    At low risk for fall 08/15/2018   Cystitis    Depression    Gastritis    GERD (gastroesophageal reflux disease)    Hashimoto's thyroiditis    Hx    Hyperlipidemia    Hypertension    Hypothyroidism    Normocytic anemia 2009 Hb 9.9-11.1   Osteopenia    Shoulder pain    Urinary incontinence    Past Surgical History:  Procedure Laterality  Date   Bilateral tubal ligation     BIOPSY  04/21/2020   Procedure: BIOPSY;  Surgeon: Corbin Ade, MD;  [facility];  Service: Endoscopy;;   BREAST EXCISIONAL BIOPSY  2010   Excisional biopsy of benign left breast mass -lopoma    BREAST SURGERY     left nreast biopsy   COLONOSCOPY  08/26/2011   SLF: 1. Internal hemorrhoids   COLONOSCOPY N/A 10/06/2015   [id number]:[id number] fissure or internal hemorrhoids/mild sigmoid colitis. benign colonic path    COLONOSCOPY WITH PROPOFOL N/A 03/15/2021   Procedure: COLONOSCOPY WITH PROPOFOL;  Surgeon: [name], DO;   [facility];  Service: Endoscopy;  Laterality: N/A;  3:00pm   colonscopy  2005   Dr. Katrinka Blazing   ESOPHAGOGASTRODUODENOSCOPY  12/2009   chronic gastritis   ESOPHAGOGASTRODUODENOSCOPY N/A 05/17/2017   Dr. Darrick Penna; gastritis, normal small bowel biopsies, multiple gastric polyps. fundic gland polyps   ESOPHAGOGASTRODUODENOSCOPY (EGD) WITH PROPOFOL N/A 04/21/2020   Rourk: multiple gastric polyps, biopsy of one c/w fundic gland. gastric biopsy showed mild chronic gastritis but no H.pylori   GIVENS CAPSULE STUDY N/A 05/30/2017   gastritis, no source for anemia identified   left knee surgery Left March 1,2017   PARTIAL KNEE ARTHROPLASTY Left 08/09/2017   Procedure: LEFT UNICOMPARTMENTAL KNEE;  Surgeon: Ollen Gross, MD;  Location: [facility];  Service: Orthopedics;  Laterality: Left;   POLYPECTOMY  04/21/2020   Procedure: POLYPECTOMY;  Surgeon: Corbin Ade, MD;  [facility];  Service: Endoscopy;;  gastric   Resection of left lobe of thyroid     right carpal tunnel release     rt. knee athroscopy  2004   SAVORY DILATION N/A 05/17/2017   Procedure: SAVORY DILATION;  Surgeon: West Bali, MD;  [facility];  Service: Endoscopy;  Laterality: N/A;   TOTAL ABDOMINAL HYSTERECTOMY  1994   UMBILICAL HERNIA REPAIR     Urethral dilation for stenosis  2009   Family History  Problem Relation Age of Onset   Pneumonia Mother    Healthy Father    Colon cancer Neg Hx    Anesthesia problems Neg Hx    Hypotension Neg Hx    Malignant hyperthermia Neg Hx    Pseudochol deficiency Neg Hx    Social History   Socioeconomic History   Marital status: Married    Spouse name: Not on file   Number of children: 3   Years of education: Not on file   Highest education level: Not on file  Occupational History   Occupation: Designer, 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   Tobacco Use   Smoking status: Former    Packs/day: 0.25    Years: 1.00    Pack years: 0.25    Types: Cigarettes     Quit date: 08/25/1972    Years since quitting: 49.0   Smokeless tobacco: Never  Vaping Use   Vaping Use: Never used  Substance and Sexual Activity   Alcohol use: No   Drug use: No   Sexual activity: Not Currently    Birth control/protection: Surgical    Comment: hyst  Other Topics Concern   Not on file  Social History Narrative   Not on file   Social Determinants of Health   Financial Resource Strain: Low Risk    Difficulty of Paying Living Expenses: Not hard at all  Food Insecurity: No Food Insecurity   Worried About Radiation protection practitioner of Food in the Last Year: Never  true   Ran Out of Food in the Last Year: Never true  Transportation Needs: No Transportation Needs   Lack of Transportation (Medical): No   Lack of Transportation (Non-Medical): No  Physical Activity: Inactive   Days of Exercise per Week: 1 day   Minutes of Exercise per Session: 0 min  Stress: No Stress Concern Present   Feeling of Stress : Not at all  Social Connections: Socially Integrated   Frequency of Communication with Friends and Family: Twice a week   Frequency of Social Gatherings with Friends and Family: Twice a week   Attends Religious Services: 1 to 4 times per year   Active Member of [employer] or Organizations: Yes   Attends Banker Meetings: 1 to 4 times per year   Marital Status: Married    Tobacco Counseling Counseling given: Not Answered   Clinical Intake:                 Diabetic?no         Activities of Daily Living No flowsheet data found.  Patient Care Team: Kerri Perches, MD as PCP - [facility], Darleene Cleaver, MD (Inactive) (Gastroenterology) Laqueta Linden, MD (Inactive) as Attending Physician (Cardiology) Beverely Low, MD as Consulting Physician (Orthopedic Surgery) Roma Kayser, MD as Consulting Physician (Endocrinology) Ollen Gross, MD as Consulting Physician (Orthopedic Surgery) [name], DO as Consulting Physician  (Internal Medicine)  Indicate any recent Medical Services you may have received from other than [facility] providers in the past year (date may be approximate).     Assessment:   This is a routine wellness examination for [place].  Hearing/Vision screen No results found.  Dietary issues and exercise activities discussed:     Goals Addressed   None   Depression Screen PHQ 2/9 Scores 08/02/2021 07/15/2021 06/30/2021 04/22/2021 01/07/2021 10/27/2020 09/04/2020  PHQ - 2 Score 0 0 0 0 0 0 0  PHQ- 9 Score - - - - - - 1    Fall Risk Fall Risk  08/02/2021 07/15/2021 06/30/2021 04/22/2021 01/07/2021  Falls in the past year? 0 0 0 0 0  Number falls in past yr: 0 0 0 0 0  Injury with Fall? 0 0 0 0 0  Risk for fall due to : No Fall Risks No Fall Risks - - No Fall Risks  Follow up Falls evaluation completed Falls evaluation completed - - Falls evaluation completed    FALL RISK PREVENTION PERTAINING TO THE HOME:  Any stairs in or around the home? No  If so, are there any without handrails? No  Home free of loose throw rugs in walkways, pet beds, electrical cords, etc? Yes  Adequate lighting in your home to reduce risk of falls? Yes   ASSISTIVE DEVICES UTILIZED TO PREVENT FALLS:  Life alert? No  Use of a cane, walker or w/c? No  Grab bars in the bathroom? yes Shower chair or bench in shower? No  Elevated toilet seat or a handicapped toilet? Yes   TIMED UP AND GO:  Was the test performed? Yes .  Length of time to ambulate 10 feet: 3 sec.   Gait steady and fast without use of assistive device  Cognitive Function:     6CIT Screen 08/07/2020 08/07/2019 07/30/2018 07/25/2017  What Year? 0 points 0 points 0 points 0 points  What month? 0 points 0 points 0 points 0 points  What time? 0 points 0 points 0 points 0 points  Count back  from 20 0 points 0 points 0 points 0 points  Months in reverse 0 points 0 points 0 points 0 points  Repeat phrase 0 points 0 points 0 points 0 points  Total Score 0 0 0 0     Immunizations Immunization History  Administered Date(s) Administered   Fluad Quad(high Dose 65+) 06/20/2019, 08/05/2020, 06/30/2021   Influenza Split 07/17/2012   Influenza Whole 10/08/2003, 07/01/2010, 07/14/2011   Influenza,inj,Quad PF,6+ Mos 08/14/2013, 08/28/2014, 09/03/2015, 08/10/2016, 06/21/2017, 06/26/2018   Influenza-Unspecified 09/14/2017   Moderna SARS-COV2 Booster Vaccination 04/05/2021   Moderna Sars-Covid-2 Vaccination 12/15/2019, 01/12/2020, 08/14/2020   Pneumococcal Conjugate-13 06/05/2014   Pneumococcal Polysaccharide-23 11/20/2015   Td 04/21/2004   Tdap 06/05/2014   Zoster Recombinat (Shingrix) 04/22/2021   Zoster, Live 03/31/2011    TDAP status: Up to date  Flu Vaccine status: Up to date  Pneumococcal vaccine status: Up to date  Covid-19 vaccine status: Completed vaccines  Qualifies for Shingles Vaccine? Yes   Zostavax completed Yes   Shingrix Completed?: No.    Education has been provided regarding the importance of this vaccine. Patient has been advised to call insurance company to determine out of pocket expense if they have not yet received this vaccine. Advised may also receive vaccine at local pharmacy or Health Dept. Verbalized acceptance and understanding.  Screening Tests Health Maintenance  Topic Date Due   COVID-19 Vaccine (4 - Booster for Moderna series) 05/31/2021   Zoster Vaccines- Shingrix (2 of 2) 07/27/2023 (Originally 06/17/2021)   MAMMOGRAM  10/16/2022   TETANUS/TDAP  06/05/2024   COLONOSCOPY (Pts 45-59yrs Insurance coverage will need to be confirmed)  03/16/2031   Pneumonia Vaccine 11+ Years old  Completed   INFLUENZA VACCINE  Completed   DEXA SCAN  Completed   Hepatitis C Screening  Completed   HPV VACCINES  Aged Out    Health Maintenance  Health Maintenance Due  Topic Date Due   COVID-19 Vaccine (4 - Booster for Moderna series) 05/31/2021    Colorectal cancer screening: Type of screening: Colonoscopy. Completed  03-15-21. Repeat every 10 years  Mammogram status: Completed 10-16-20. Repeat every year  Bone Density status: Completed 10-9-196. Results reflect: Bone density results: NORMAL. Repeat every 5 years.  Lung Cancer Screening: (Low Dose CT Chest recommended if Age 71-80 years, 30 pack-year currently smoking OR have quit w/in 15years.) does not qualify.   Lung Cancer Screening Referral: NA  Additional Screening:  Hepatitis C Screening: does qualify; Completed 04-12-16  Vision Screening: Recommended annual ophthalmology exams for early detection of glaucoma and other disorders of the eye. Is the patient up to date with their annual eye exam?  Yes  Who is the provider or what is the name of the office in which the patient attends annual eye exams? My eye doctor [facility] If pt is not established with a provider, would they like to be referred to a provider to establish care? No .   Dental Screening: Recommended annual dental exams for proper oral hygiene  Community Resource Referral / Chronic Care Management: CRR required this visit?  No   CCM required this visit?  No      Plan:     I have personally reviewed and noted the following in the patient's chart:   Medical and social history Use of alcohol, tobacco or illicit drugs  Current medications and supplements including opioid prescriptions.  Functional ability and status Nutritional status Physical activity Advanced directives List of other physicians Hospitalizations, surgeries, and ER visits in previous  12 months Vitals Screenings to include cognitive, depression, and falls Referrals and appointments  In addition, I have reviewed and discussed with patient certain preventive protocols, quality metrics, and best practice recommendations. A written personalized care plan for preventive services as well as general preventive health recommendations were provided to patient.     Park Breed, CMA   08/30/2021   Nurse  Notes: The patient was in the office. The provider was in the office and was Trena Platt, MD.

## 2021-08-30 NOTE — Patient Instructions (Signed)
[name] , Thank you for taking time to come for your Medicare Wellness Visit. I appreciate your ongoing commitment to your health goals. Please review the following plan we discussed and let me know if I can assist you in the future.   Screening recommendations/referrals: Colonoscopy: up to date Mammogram: up to date Bone Density: up to date Recommended yearly ophthalmology/optometry visit for glaucoma screening and checkup Recommended yearly dental visit for hygiene and checkup  Vaccinations: Influenza vaccine: up to date Pneumococcal vaccine: up to date Tdap vaccine: up to date    Shingles vaccine: due now     Advanced directives: information provided  Conditions/risks identified: hypertension   Next appointment: 1 year    Preventive Care 65 Years and Older, Female Preventive care refers to lifestyle choices and visits with your health care provider that can promote health and wellness. What does preventive care include? A yearly physical exam. This is also called an annual well check. Dental exams once or twice a year. Routine eye exams. Ask your health care provider how often you should have your eyes checked. Personal lifestyle choices, including: Daily care of your teeth and gums. Regular physical activity. Eating a healthy diet. Avoiding tobacco and drug use. Limiting alcohol use. Practicing safe sex. Taking low-dose aspirin every day. Taking vitamin and mineral supplements as recommended by your health care provider. What happens during an annual well check? The services and screenings done by your health care provider during your annual well check will depend on your age, overall health, lifestyle risk factors, and family history of disease. Counseling  Your health care provider may ask you questions about your: Alcohol use. Tobacco use. Drug use. Emotional well-being. Home and relationship well-being. Sexual activity. Eating habits. History of  falls. Memory and ability to understand (cognition). Work and work Astronomer. Reproductive health. Screening  You may have the following tests or measurements: Height, weight, and BMI. Blood pressure. Lipid and cholesterol levels. These may be checked every 5 years, or more frequently if you are over 25 years old. Skin check. Lung cancer screening. You may have this screening every year starting at age 19 if you have a 30-pack-year history of smoking and currently smoke or have quit within the past 15 years. Fecal occult blood test (FOBT) of the stool. You may have this test every year starting at age 17. Flexible sigmoidoscopy or colonoscopy. You may have a sigmoidoscopy every 5 years or a colonoscopy every 10 years starting at age 8. Hepatitis C blood test. Hepatitis B blood test. Sexually transmitted disease (STD) testing. Diabetes screening. This is done by checking your blood sugar (glucose) after you have not eaten for a while (fasting). You may have this done every 1-3 years. Bone density scan. This is done to screen for osteoporosis. You may have this done starting at age 55. Mammogram. This may be done every 1-2 years. Talk to your health care provider about how often you should have regular mammograms. Talk with your health care provider about your test results, treatment options, and if necessary, the need for more tests. Vaccines  Your health care provider may recommend certain vaccines, such as: Influenza vaccine. This is recommended every year. Tetanus, diphtheria, and acellular pertussis (Tdap, Td) vaccine. You may need a Td booster every 10 years. Zoster vaccine. You may need this after age 99. Pneumococcal 13-valent conjugate (PCV13) vaccine. One dose is recommended after age 55. Pneumococcal polysaccharide (PPSV23) vaccine. One dose is recommended after age 47. Talk to  your health care provider about which screenings and vaccines you need and how often you need  them. This information is not intended to replace advice given to you by your health care provider. Make sure you discuss any questions you have with your health care provider. Document Released: 11/13/2015 Document Revised: 07/06/2016 Document Reviewed: 08/18/2015 Elsevier Interactive Patient Education  2017 [place] Prevention in the Home Falls can cause injuries. They can happen to people of all ages. There are many things you can do to make your home safe and to help prevent falls. What can I do on the outside of my home? Regularly fix the edges of walkways and driveways and fix any cracks. Remove anything that might make you trip as you walk through a door, such as a raised step or threshold. Trim any bushes or trees on the path to your home. Use bright outdoor lighting. Clear any walking paths of anything that might make someone trip, such as rocks or tools. Regularly check to see if handrails are loose or broken. Make sure that both sides of any steps have handrails. Any raised decks and porches should have guardrails on the edges. Have any leaves, snow, or ice cleared regularly. Use sand or salt on walking paths during winter. Clean up any spills in your garage right away. This includes oil or grease spills. What can I do in the bathroom? Use night lights. Install grab bars by the toilet and in the tub and shower. Do not use towel bars as grab bars. Use non-skid mats or decals in the tub or shower. If you need to sit down in the shower, use a plastic, non-slip stool. Keep the floor dry. Clean up any water that spills on the floor as soon as it happens. Remove soap buildup in the tub or shower regularly. Attach bath mats securely with double-sided non-slip rug tape. Do not have throw rugs and other things on the floor that can make you trip. What can I do in the bedroom? Use night lights. Make sure that you have a light by your bed that is easy to reach. Do not use  any sheets or blankets that are too big for your bed. They should not hang down onto the floor. Have a firm chair that has side arms. You can use this for support while you get dressed. Do not have throw rugs and other things on the floor that can make you trip. What can I do in the kitchen? Clean up any spills right away. Avoid walking on wet floors. Keep items that you use a lot in easy-to-reach places. If you need to reach something above you, use a strong step stool that has a grab bar. Keep electrical cords out of the way. Do not use floor polish or wax that makes floors slippery. If you must use wax, use non-skid floor wax. Do not have throw rugs and other things on the floor that can make you trip. What can I do with my stairs? Do not leave any items on the stairs. Make sure that there are handrails on both sides of the stairs and use them. Fix handrails that are broken or loose. Make sure that handrails are as long as the stairways. Check any carpeting to make sure that it is firmly attached to the stairs. Fix any carpet that is loose or worn. Avoid having throw rugs at the top or bottom of the stairs. If you do have throw rugs, attach  them to the floor with carpet tape. Make sure that you have a light switch at the top of the stairs and the bottom of the stairs. If you do not have them, ask someone to add them for you. What else can I do to help prevent falls? Wear shoes that: Do not have high heels. Have rubber bottoms. Are comfortable and fit you well. Are closed at the toe. Do not wear sandals. If you use a stepladder: Make sure that it is fully opened. Do not climb a closed stepladder. Make sure that both sides of the stepladder are locked into place. Ask someone to hold it for you, if possible. Clearly mark and make sure that you can see: Any grab bars or handrails. First and last steps. Where the edge of each step is. Use tools that help you move around (mobility aids)  if they are needed. These include: Canes. Walkers. Scooters. Crutches. Turn on the lights when you go into a dark area. Replace any light bulbs as soon as they burn out. Set up your furniture so you have a clear path. Avoid moving your furniture around. If any of your floors are uneven, fix them. If there are any pets around you, be aware of where they are. Review your medicines with your doctor. Some medicines can make you feel dizzy. This can increase your chance of falling. Ask your doctor what other things that you can do to help prevent falls. This information is not intended to replace advice given to you by your health care provider. Make sure you discuss any questions you have with your health care provider. Document Released: 08/13/2009 Document Revised: 03/24/2016 Document Reviewed: 11/21/2014 Elsevier Interactive Patient Education  2017 [employer].

## 2021-08-31 DIAGNOSIS — G588 Other specified mononeuropathies: Secondary | ICD-10-CM | POA: Diagnosis not present

## 2021-08-31 DIAGNOSIS — M5416 Radiculopathy, lumbar region: Secondary | ICD-10-CM | POA: Diagnosis not present

## 2021-09-02 ENCOUNTER — Other Ambulatory Visit (HOSPITAL_COMMUNITY): Payer: Self-pay | Admitting: Family Medicine

## 2021-09-02 DIAGNOSIS — Z1231 Encounter for screening mammogram for malignant neoplasm of breast: Secondary | ICD-10-CM

## 2021-09-21 DIAGNOSIS — G588 Other specified mononeuropathies: Secondary | ICD-10-CM | POA: Diagnosis not present

## 2021-09-21 DIAGNOSIS — M5416 Radiculopathy, lumbar region: Secondary | ICD-10-CM | POA: Diagnosis not present

## 2021-10-01 DIAGNOSIS — G5601 Carpal tunnel syndrome, right upper limb: Secondary | ICD-10-CM | POA: Diagnosis not present

## 2021-10-01 DIAGNOSIS — G5602 Carpal tunnel syndrome, left upper limb: Secondary | ICD-10-CM | POA: Diagnosis not present

## 2021-10-06 ENCOUNTER — Other Ambulatory Visit: Payer: Self-pay | Admitting: Family Medicine

## 2021-10-13 ENCOUNTER — Ambulatory Visit: Payer: Medicare Other | Admitting: Gastroenterology

## 2021-10-13 ENCOUNTER — Other Ambulatory Visit: Payer: Self-pay

## 2021-10-13 ENCOUNTER — Encounter: Payer: Self-pay | Admitting: Gastroenterology

## 2021-10-13 VITALS — BP 162/83 | HR 67 | Temp 97.3°F | Ht 63.0 in | Wt 185.4 lb

## 2021-10-13 DIAGNOSIS — K219 Gastro-esophageal reflux disease without esophagitis: Secondary | ICD-10-CM

## 2021-10-13 DIAGNOSIS — K5900 Constipation, unspecified: Secondary | ICD-10-CM | POA: Diagnosis not present

## 2021-10-13 DIAGNOSIS — R1013 Epigastric pain: Secondary | ICD-10-CM

## 2021-10-13 MED ORDER — HYDROCORTISONE (PERIANAL) 2.5 % EX CREA: 1.0000 "application " | TOPICAL_CREAM | Freq: Two times a day (BID) | CUTANEOUS | 1 refills | Status: DC

## 2021-10-13 NOTE — Progress Notes (Signed)
Referring Provider: Fayrene Helper, MD Primary Care Physician:  Fayrene Helper, MD Primary GI: Dr. Abbey Chatters    Chief Complaint  Patient presents with   Abdominal Pain   Rectal Pain    Occ when has bm    HPI:   Laura [name] is a 70 y.o. female presenting today with a history of abdominal pain, nocturnal flushing, weight loss, and work-up as outlined below. Colonoscopy 2016 unremarkable with a 10-year recall. Givens capsule in 2018 with gastritis. Most recent EGD June 2021 with a few fundic gland polyps, otherwise unremarkable.  Biopsies did show chronic gastritis negative for H. pylori.   Has good BM with Linzess 72 mcg. Will have cramping with BMs at times. Twice a month. Omeprazole BID. No dysphagia. GERD controlled. Weight is stable. Feels like she has gas in upper abdomen. Overall, she feels improved from prior.   No rectal bleeding. Had some rectal pain occasionally with BM and puts preparation H on it with relief. Declining rectal exam.     Additional workup has included:  CT June 2021 with no significant findings.  HIDA September 2021 with slightly low EF at 27%.  labs that had been completed included catecholamines,metanephrines, ESR, CRP, tryptase all of which were ok. 5 HIAA elevated at 13.6. TSH borderline low. CT A/P with and without contrast 10/06/20 with no acute findings.  NM PET with Dotatate 11/13/2020 with no evidence of well-differentiated neuroendocrine tumor within the abdomen or pelvis.  No evidence of metastatic disease.     Past Medical History:  Diagnosis Date   Anxiety    At low risk for fall 08/15/2018   Cystitis    Depression    Gastritis    GERD (gastroesophageal reflux disease)    Hashimoto's thyroiditis    Hx    Hyperlipidemia    Hypertension    Hypothyroidism    Normocytic anemia 2009 Hb 9.9-11.1   Osteopenia    Shoulder pain    Urinary incontinence     Past Surgical History:  Procedure Laterality Date   Bilateral  tubal ligation     BIOPSY  04/21/2020   Procedure: BIOPSY;  Surgeon: Daneil Dolin, MD;  Location: [facility];  Service: Endoscopy;;   BREAST EXCISIONAL BIOPSY  2010   Excisional biopsy of benign left breast mass -lopoma    BREAST SURGERY     left nreast biopsy   COLONOSCOPY  08/26/2011   SLF: 1. Internal hemorrhoids   COLONOSCOPY N/A 10/06/2015   HQP:RFFM fissure or internal hemorrhoids/mild sigmoid colitis. benign colonic path    COLONOSCOPY WITH PROPOFOL N/A 03/15/2021   Procedure: COLONOSCOPY WITH PROPOFOL;  Surgeon: Eloise Harman, DO;  Location: [facility];  Service: Endoscopy;  Laterality: N/A;  3:00pm   colonscopy  2005   Dr. Tamala Julian   ESOPHAGOGASTRODUODENOSCOPY  12/2009   chronic gastritis   ESOPHAGOGASTRODUODENOSCOPY N/A 05/17/2017   Dr. Oneida Alar; gastritis, normal small bowel biopsies, multiple gastric polyps. fundic gland polyps   ESOPHAGOGASTRODUODENOSCOPY (EGD) WITH PROPOFOL N/A 04/21/2020   Rourk: multiple gastric polyps, biopsy of one c/w fundic gland. gastric biopsy showed mild chronic gastritis but no H.pylori   GIVENS CAPSULE STUDY N/A 05/30/2017   gastritis, no source for anemia identified   left knee surgery Left March 1,2017   PARTIAL KNEE ARTHROPLASTY Left 08/09/2017   Procedure: LEFT UNICOMPARTMENTAL KNEE;  Surgeon: Gaynelle Arabian, MD;  Location: [facility];  Service: Orthopedics;  Laterality: Left;   POLYPECTOMY  04/21/2020  Procedure: POLYPECTOMY;  Surgeon: Corbin Ade, MD;  Location: [facility];  Service: Endoscopy;;  gastric   Resection of left lobe of thyroid     right carpal tunnel release     rt. knee athroscopy  2004   SAVORY DILATION N/A 05/17/2017   Procedure: SAVORY DILATION;  Surgeon: West Bali, MD;  Location: [facility];  Service: Endoscopy;  Laterality: N/A;   TOTAL ABDOMINAL HYSTERECTOMY  1994   UMBILICAL HERNIA REPAIR     Urethral dilation for stenosis  2009    Current Outpatient Medications  Medication Sig Dispense Refill    acetaminophen (TYLENOL) 500 MG tablet Take 1 tablet (500 mg total) by mouth every 6 (six) hours as needed. (Patient taking differently: Take 1,500 mg by mouth daily as needed for mild pain or headache.) 30 tablet 0   amLODipine (NORVASC) 2.5 MG tablet TAKE 1 TABLET BY MOUTH  DAILY 90 tablet 3   atorvastatin (LIPITOR) 80 MG tablet TAKE 1 TABLET BY MOUTH  DAILY (Patient taking differently: Take 80 mg by mouth daily.) 90 tablet 3   citalopram (CELEXA) 40 MG tablet TAKE 1 TABLET BY MOUTH  DAILY 30 tablet 11   gabapentin (NEURONTIN) 100 MG capsule TAKE 1 CAPSULE BY MOUTH AT  BEDTIME 90 capsule 3   hydrochlorothiazide (HYDRODIURIL) 25 MG tablet TAKE 1 TABLET BY MOUTH  DAILY 90 tablet 3   Lactobacillus Rhamnosus, GG, (CULTURELLE PO) Take by mouth daily.     levothyroxine (SYNTHROID) 88 MCG tablet Take 1 tablet (88 mcg total) by mouth daily before breakfast. 90 tablet 1   linaclotide (LINZESS) 72 MCG capsule Take 1 capsule (72 mcg total) by mouth daily before breakfast. (Patient taking differently: Take 72 mcg by mouth daily as needed (constipation.).) 90 capsule 3   lisinopril (ZESTRIL) 40 MG tablet Take 40 mg by mouth daily.     methocarbamol (ROBAXIN) 500 MG tablet Take 500 mg by mouth 3 (three) times daily.     montelukast (SINGULAIR) 10 MG tablet TAKE 1 TABLET BY MOUTH AT  BEDTIME 30 tablet 11   Multiple Vitamin (MULTIVITAMIN WITH MINERALS) TABS tablet Take 1 tablet by mouth daily.     omeprazole (PRILOSEC) 20 MG capsule TAKE 1 CAPSULE(20 MG) BY MOUTH TWICE DAILY BEFORE A MEAL 60 capsule 5   oxybutynin (DITROPAN-XL) 10 MG 24 hr tablet TAKE 1 TABLET BY MOUTH ONCE DAILY AS NEEDED 10 tablet 1   Polyethyl Glycol-Propyl Glycol (SYSTANE OP) Place 1 drop into both eyes daily as needed (dry eyes).     Potassium 99 MG TABS Take 99 mg by mouth 2 (two) times daily.     traMADol (ULTRAM) 50 MG tablet Take 50 mg by mouth every 8 (eight) hours as needed for moderate pain.     No current facility-administered  medications for this visit.    Allergies as of 10/13/2021 - Review Complete 10/13/2021  Allergen Reaction Noted   Cozaar [losartan potassium] Cough 07/04/2018   Dilaudid [hydromorphone hcl] Other (See Comments) 03/01/2011   Levaquin [levofloxacin] Other (See Comments) 07/28/2014   Nsaids Other (See Comments) 01/13/2010   Promethazine  05/17/2017    Family History  Problem Relation Age of Onset   Pneumonia Mother    Healthy Father    Colon cancer Neg Hx    Anesthesia problems Neg Hx    Hypotension Neg Hx    Malignant hyperthermia Neg Hx    Pseudochol deficiency Neg Hx     Social History  Socioeconomic History   Marital status: Married    Spouse name: Not on file   Number of children: 3   Years of education: Not on file   Highest education level: Not on file  Occupational History   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   Tobacco Use   Smoking status: Former    Packs/day: 0.25    Years: 1.00    Pack years: 0.25    Types: Cigarettes    Quit date: 08/25/1972    Years since quitting: 49.1   Smokeless tobacco: Never  Vaping Use   Vaping Use: Never used  Substance and Sexual Activity   Alcohol use: No   Drug use: No   Sexual activity: Not Currently    Birth control/protection: Surgical    Comment: hyst  Other Topics Concern   Not on file  Social History Narrative   Not on file   Social Determinants of Health   Financial Resource Strain: Low Risk    Difficulty of Paying Living Expenses: Not hard at all  Food Insecurity: No Food Insecurity   Worried About Charity fundraiser in the Last Year: Never true   [place] in the Last Year: Never true  Transportation Needs: No Transportation Needs   Lack of Transportation (Medical): No   Lack of Transportation (Non-Medical): No  Physical Activity: Inactive   Days of Exercise per Week: 0 days   Minutes of Exercise per Session: 0 min  Stress: No Stress Concern Present   Feeling of Stress : Not at  all  Social Connections: Moderately Integrated   Frequency of Communication with Friends and Family: More than three times a week   Frequency of Social Gatherings with Friends and Family: Twice a week   Attends Religious Services: More than 4 times per year   Active Member of [employer] or Organizations: No   Attends Archivist Meetings: Never   Marital Status: Married    Review of Systems: Gen: Denies fever, chills, anorexia. Denies fatigue, weakness, weight loss.  CV: Denies chest pain, palpitations, syncope, peripheral edema, and claudication. Resp: Denies dyspnea at rest, cough, wheezing, coughing up blood, and pleurisy. GI: see HPI Derm: Denies rash, itching, dry skin Psych: Denies depression, anxiety, memory loss, confusion. No homicidal or suicidal ideation.  Heme: Denies bruising, bleeding, and enlarged lymph nodes.  Physical Exam: BP (!) 162/83    Pulse 67    Temp (!) 97.3 F (36.3 C) (Temporal)    Ht _0  (1.6 m)    Wt 185 lb 6.4 oz (84.1 kg)    BMI 32.84 kg/m  General:   Alert and oriented. No distress noted. Pleasant and cooperative.  Head:  Normocephalic and atraumatic. Eyes:  Conjuctiva clear without scleral icterus. Mouth:  mask in place Abdomen:  +BS, soft,mild TTP epigastric and non-distended. No rebound or guarding. No HSM or masses noted. Msk:  Symmetrical without gross deformities. Normal posture. Extremities:  Without edema. Neurologic:  Alert and  oriented x4 Psych:  Alert and cooperative. Normal mood and affect.  ASSESSMENT: Laura [name] is a 70 y.o. female presenting today with a history of abdominal pain, nocturnal flushing, weight loss, and extensive evaluation as noted.   She is overall improved from prior visits, with rare epigastric discomfort that usually follows a BM. Episodes rarely per month. She feels her constipation is well-managed and has productive BMs on Linzess 72 mcg.  GERD is well-controlled overall with omeprazole BID. No alarm  signs/symptoms.   Weight is stable.   Rectal discomfort: occasionally with BM. Declined rectal exam. Notes improvement with OTC agents. I have sent in Anusol cream to use prn. No overt GI bleeding.   PLAN:  Anusol cream prn Continue omeprazole BID Linzess 72 mcg: call if needs higher dosage Return in 6 months   Annitta Needs, PhD, [facility]

## 2021-10-13 NOTE — Patient Instructions (Signed)
Continue omeprazole twice a day.  Continue Linzess daily.  Please call if abdominal pain worsens, weight loss, nausea or vomiting, decreased appetite.  We will see you in 6 months!   I enjoyed seeing you again today! As you know, I value our relationship and want to provide genuine, compassionate, and quality care. I welcome your feedback. If you receive a survey regarding your visit,  I greatly appreciate you taking time to fill this out. See you next time!  Gelene Mink, PhD, ANP-BC [facility]

## 2021-10-18 ENCOUNTER — Ambulatory Visit (HOSPITAL_COMMUNITY)
Admission: RE | Admit: 2021-10-18 | Discharge: 2021-10-18 | Disposition: A | Discharge status: Home / Self Care | Payer: Medicare Other | Source: Ambulatory Visit | Attending: Family Medicine | Admitting: Family Medicine

## 2021-10-18 ENCOUNTER — Other Ambulatory Visit: Payer: Self-pay

## 2021-10-18 DIAGNOSIS — Z1231 Encounter for screening mammogram for malignant neoplasm of breast: Secondary | ICD-10-CM | POA: Insufficient documentation

## 2021-10-18 IMAGING — MG MM DIGITAL SCREENING BILAT W/ TOMO AND CAD
8 series · 8 of 24 positions shown · non-contrast
Comparison: Previous exam(s).

CLINICAL DATA: Screening.

EXAM:
DIGITAL SCREENING BILATERAL MAMMOGRAM WITH TOMOSYNTHESIS AND CAD
TECHNIQUE: Bilateral screening digital craniocaudal and mediolateral oblique
mammograms were obtained. Bilateral screening digital breast
tomosynthesis was performed. The images were evaluated with
computer-aided detection.

[L CC synth-2D]
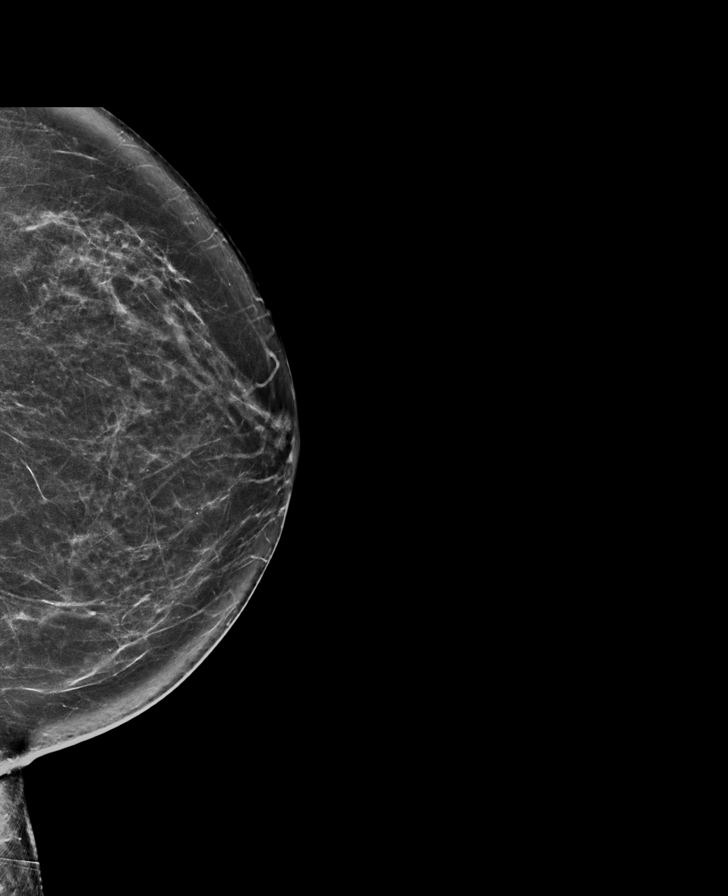

[L MLO synth-2D]
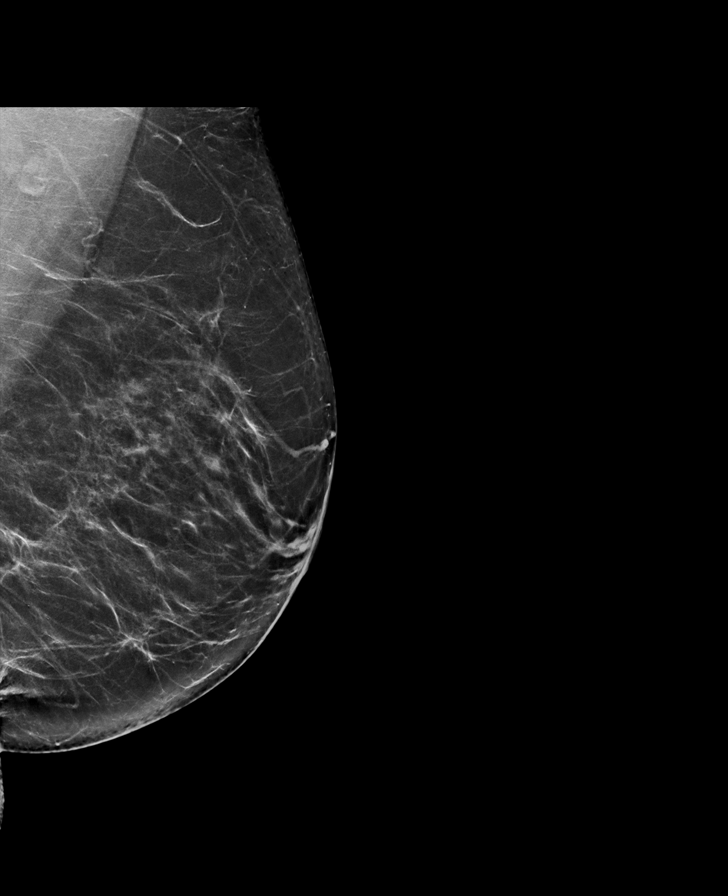

[R MLO synth-2D]
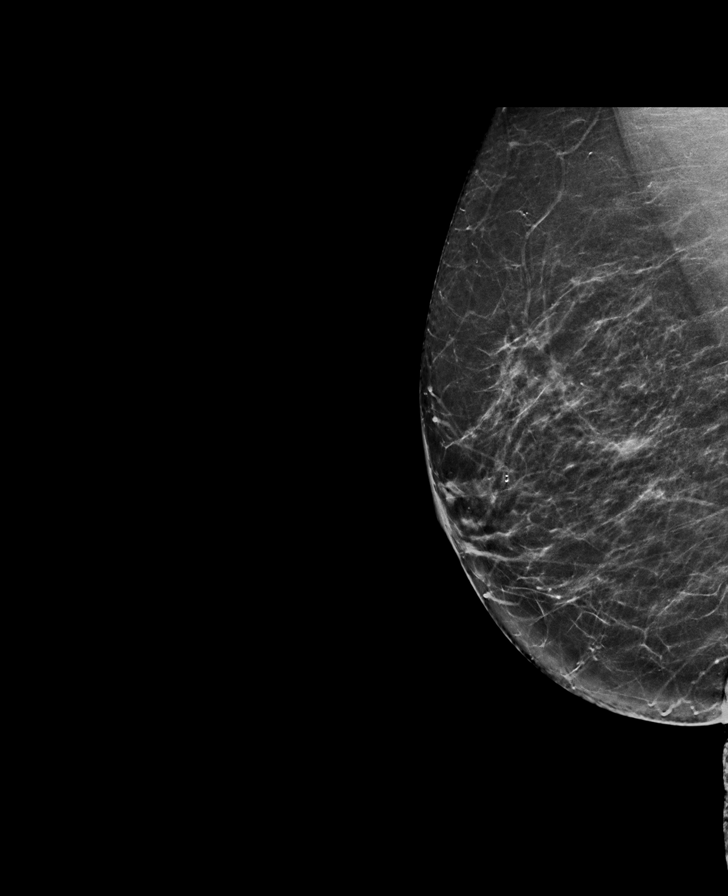

[R CC synth-2D]
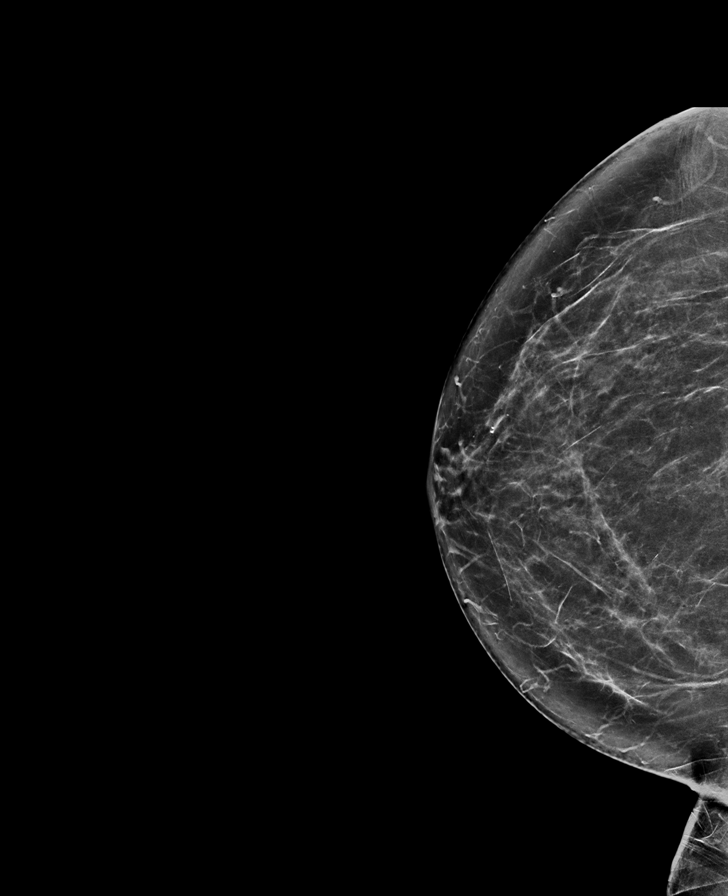

[R MLO tomo · tomo slice 35/69.0]
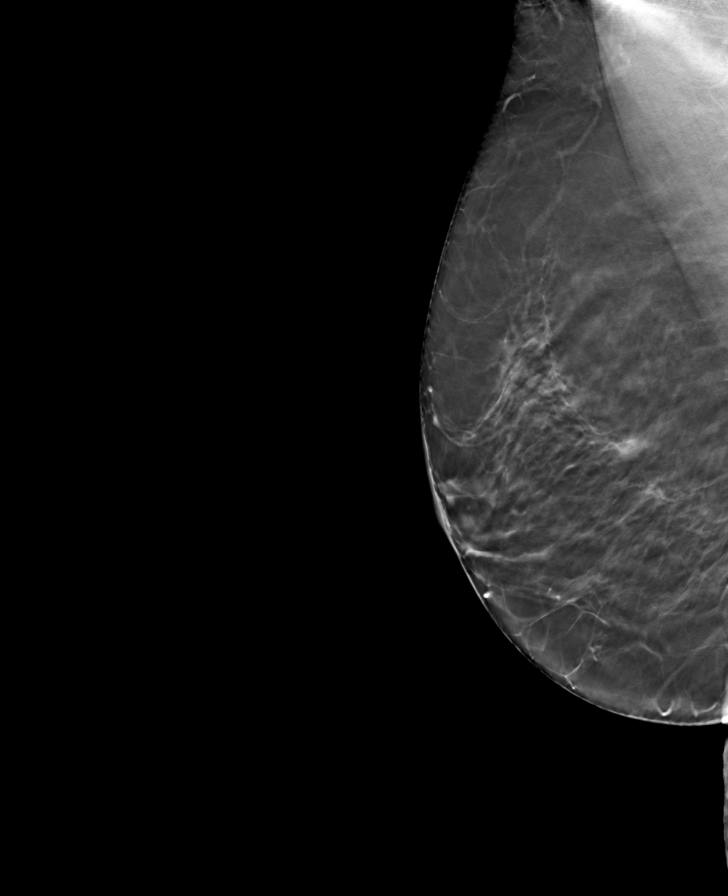

[R CC tomo · tomo slice 38/75.0]
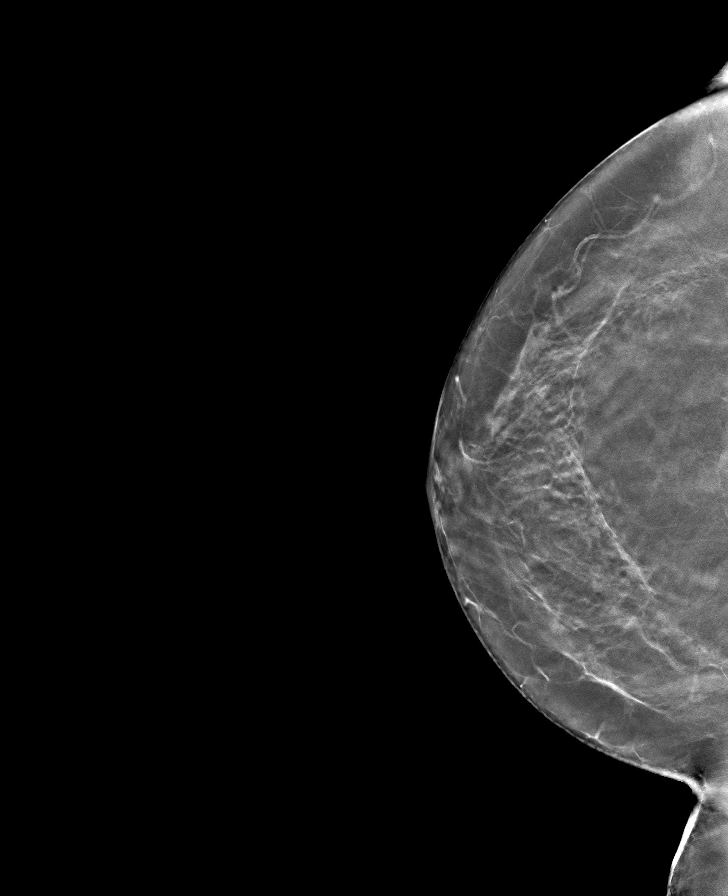

[L MLO tomo · tomo slice 39/78.0]
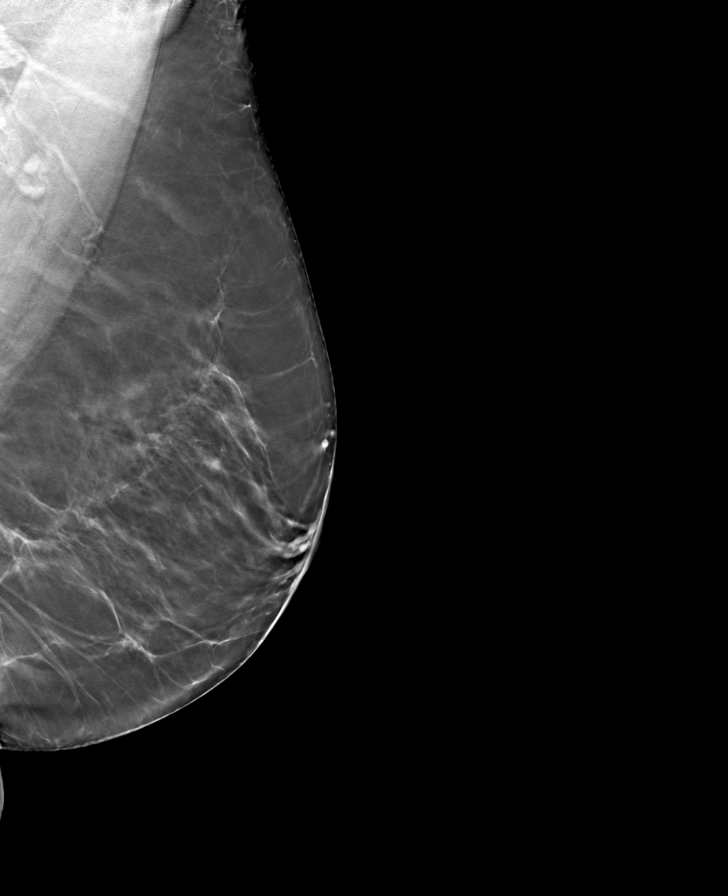

[L CC tomo · tomo slice 37/74.0]
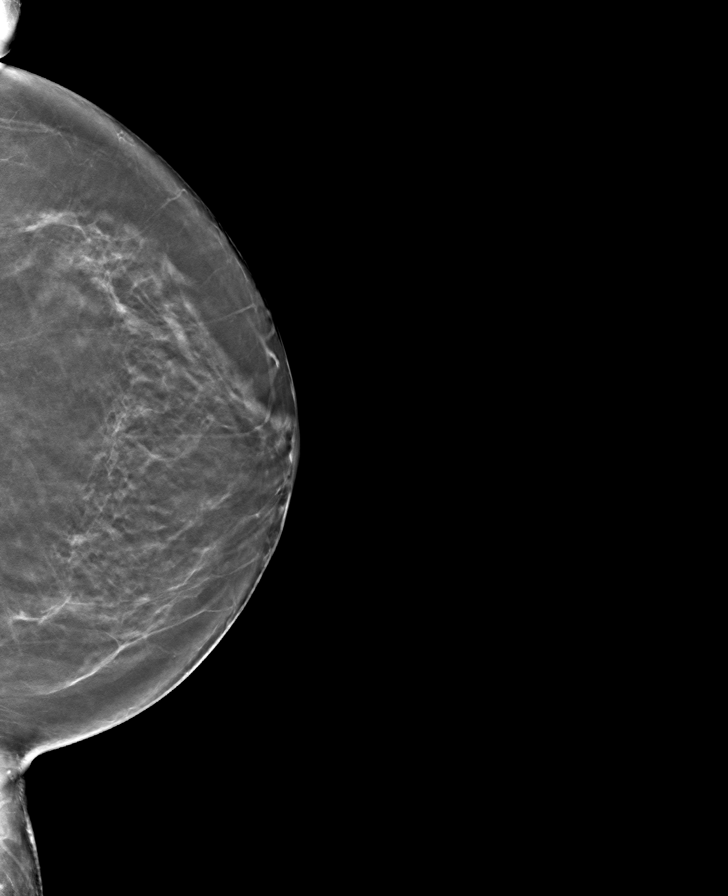

[8 of 24 positions shown; findings below may reference images not displayed]

ACR Breast Density Category b: There are scattered areas of
fibroglandular density.
FINDINGS: There are no findings suspicious for malignancy.
IMPRESSION: No mammographic evidence of malignancy. A result letter of this
screening mammogram will be mailed directly to the patient.

RECOMMENDATION:
Screening mammogram in one year. (Code:[BY])

BI-RADS CATEGORY  1: Negative.

## 2021-10-21 ENCOUNTER — Encounter: Payer: Self-pay | Admitting: Family Medicine

## 2021-10-26 DIAGNOSIS — M5440 Lumbago with sciatica, unspecified side: Secondary | ICD-10-CM | POA: Diagnosis not present

## 2021-10-26 DIAGNOSIS — G588 Other specified mononeuropathies: Secondary | ICD-10-CM | POA: Diagnosis not present

## 2021-10-26 DIAGNOSIS — M5416 Radiculopathy, lumbar region: Secondary | ICD-10-CM | POA: Diagnosis not present

## 2021-11-03 ENCOUNTER — Encounter: Payer: Self-pay | Admitting: Family Medicine

## 2021-11-03 ENCOUNTER — Other Ambulatory Visit: Payer: Self-pay

## 2021-11-03 ENCOUNTER — Ambulatory Visit (INDEPENDENT_AMBULATORY_CARE_PROVIDER_SITE_OTHER): Payer: Medicare Other | Admitting: Family Medicine

## 2021-11-03 ENCOUNTER — Ambulatory Visit (HOSPITAL_COMMUNITY)
Admission: RE | Admit: 2021-11-03 | Discharge: 2021-11-03 | Disposition: A | Discharge status: Home / Self Care | Payer: Medicare Other | Source: Ambulatory Visit | Attending: Family Medicine | Admitting: Family Medicine

## 2021-11-03 VITALS — BP 125/81 | HR 67 | Ht 63.0 in | Wt 184.0 lb

## 2021-11-03 DIAGNOSIS — E785 Hyperlipidemia, unspecified: Secondary | ICD-10-CM

## 2021-11-03 DIAGNOSIS — R0781 Pleurodynia: Secondary | ICD-10-CM | POA: Insufficient documentation

## 2021-11-03 DIAGNOSIS — Z0000 Encounter for general adult medical examination without abnormal findings: Secondary | ICD-10-CM

## 2021-11-03 DIAGNOSIS — E559 Vitamin D deficiency, unspecified: Secondary | ICD-10-CM

## 2021-11-03 DIAGNOSIS — I10 Essential (primary) hypertension: Secondary | ICD-10-CM

## 2021-11-03 NOTE — Assessment & Plan Note (Signed)

## 2021-11-03 NOTE — Progress Notes (Signed)
° ° °  Laura [name]     MRN: [id number]      DOB: [birth year]  HPI: Patient is in for annual physical exam. Immunization is reviewed , and  updated if needed. C/o right rib pain after fall on 10/16/2021 Allergy drainage is as usual this time of the year in particular. C/o persistent hand and finger pain, will call for Ortho referral when ready    PE: BP 125/81    Pulse 67    Ht 5\' 3"  (1.6 m)    Wt 184 lb 0.6 oz (83.5 kg)    SpO2 93%    BMI 32.60 kg/m   Pleasant  female, alert and oriented x 3, in no cardio-pulmonary distress. Afebrile. HEENT No facial trauma or asymetry. Sinuses non tender.  Extra occullar muscles intact.. External ears normal, . Neck: supple, no adenopathy,JVD or thyromegaly.No bruits.  Chest: Clear to ascultation bilaterally.No crackles or wheezes.  tender to palpation over right 10th and 11th ribs, anterior  Breast: No asymetry,no masses or lumps. No tenderness. No nipple discharge or inversion. No axillary or supraclavicular adenopathy  Cardiovascular system; Heart sounds normal,  S1 and  S2 ,no S3.  No murmur, or thrill. Apical beat not displaced Peripheral pulses normal.  Abdomen: Soft, non tender, no organomegaly or masses. No bruits. Bowel sounds normal. No guarding, tenderness or rebound.  r Musculoskeletal exam: Full ROM of spine, hips , shoulders and knees. No deformity ,swelling or crepitus noted. No muscle wasting or atrophy.   Neurologic: Cranial nerves 2 to 12 intact. Power, tone ,sensation and reflexes normal throughout. No disturbance in gait. No tremor.  Skin: Intact, no ulceration, erythema , scaling or rash noted. Pigmentation normal throughout  Psych; Normal mood and affect. Judgement and concentration normal   Assessment & Plan:  Annual physical exam Annual exam as documented. Counseling done  re healthy lifestyle involving commitment to 150 minutes exercise per week, heart healthy diet, and attaining healthy  weight.The importance of adequate sleep also discussed. Regular seat belt use and home safety, is also discussed. Changes in health habits are decided on by the patient with goals and time frames  set for achieving them. Immunization and cancer screening needs are specifically addressed at this visit.   Rib pain on right side Direct trauma, fall on 12/17, x ray to eval, tylenol and topical meds

## 2021-11-03 NOTE — Assessment & Plan Note (Addendum)
Direct trauma, fall on 12/17, x ray to eval, tylenol and topical meds

## 2021-11-03 NOTE — Patient Instructions (Signed)
F/U mid March, call if you need me sooner  Covid booster needed, please get at pharmacy  X ray today, use tylenol and topical rub for rib pain  Fasting lipid, cmp and EGFGR and vit d  and CBC 5days before march appt  Start dance class, teaching or dancing , this will help your mind  Let me know when you want referral for carpal tunnel to [facility]  Thanks for choosing [facility], we consider it a privelige to serve you.

## 2021-11-04 ENCOUNTER — Encounter: Payer: Self-pay | Admitting: Family Medicine

## 2021-11-11 ENCOUNTER — Other Ambulatory Visit: Payer: Self-pay

## 2021-11-11 ENCOUNTER — Encounter: Payer: Self-pay | Admitting: Family Medicine

## 2021-11-11 DIAGNOSIS — G5600 Carpal tunnel syndrome, unspecified upper limb: Secondary | ICD-10-CM

## 2021-11-19 DIAGNOSIS — M542 Cervicalgia: Secondary | ICD-10-CM | POA: Diagnosis not present

## 2021-11-21 ENCOUNTER — Encounter: Payer: Self-pay | Admitting: Family Medicine

## 2021-11-23 ENCOUNTER — Other Ambulatory Visit: Payer: Self-pay

## 2021-11-23 DIAGNOSIS — H547 Unspecified visual loss: Secondary | ICD-10-CM

## 2021-11-23 NOTE — Telephone Encounter (Signed)
Referral sent 

## 2021-11-24 ENCOUNTER — Other Ambulatory Visit: Payer: Self-pay | Admitting: Family Medicine

## 2021-12-07 ENCOUNTER — Other Ambulatory Visit: Payer: Self-pay

## 2021-12-07 ENCOUNTER — Ambulatory Visit (HOSPITAL_COMMUNITY): Payer: Medicare Other | Attending: Orthopedic Surgery | Admitting: Physical Therapy

## 2021-12-07 ENCOUNTER — Encounter (HOSPITAL_COMMUNITY): Payer: Self-pay | Admitting: Physical Therapy

## 2021-12-07 DIAGNOSIS — M542 Cervicalgia: Secondary | ICD-10-CM | POA: Insufficient documentation

## 2021-12-07 DIAGNOSIS — R293 Abnormal posture: Secondary | ICD-10-CM | POA: Insufficient documentation

## 2021-12-07 DIAGNOSIS — R2689 Other abnormalities of gait and mobility: Secondary | ICD-10-CM | POA: Diagnosis not present

## 2021-12-07 DIAGNOSIS — M5416 Radiculopathy, lumbar region: Secondary | ICD-10-CM | POA: Diagnosis not present

## 2021-12-07 NOTE — Patient Instructions (Signed)
Access Code: [email or web address] URL: [email or web address] Date: 12/07/2021 Prepared by: Georges Lynch  Exercises Seated Scapular Retraction - 2-3 x daily - 7 x weekly - 1-2 sets - 10 reps - 5 second hold Seated Cervical Retraction - 2-3 x daily - 7 x weekly - 1-2 sets - 10 reps - 5 second hold Seated Upper Trapezius Stretch - 2-3 x daily - 7 x weekly - 1 sets - 3 reps - 30 second hold

## 2021-12-07 NOTE — Therapy (Signed)
[place], [place], [address] Phone: [phone]   Fax:  [phone]  Physical Therapy Evaluation  Patient Details  Name: Laura [name] MRN: [id number] Date of Birth: 1951-05-14 Referring Provider (PT): Edmonia Lynch MD   Encounter Date: 12/07/2021   PT End of Session - 12/07/21 1013     Visit Number 1    Number of Visits 8    Date for PT Re-Evaluation 01/04/22    Authorization Type [employer]    PT Start Time [id number]    PT Stop Time 1030    PT Time Calculation (min) 37 min    Activity Tolerance Patient tolerated treatment well    Behavior During Therapy [facility] for tasks assessed/performed             Past Medical History:  Diagnosis Date   Anxiety    At low risk for fall 08/15/2018   Cystitis    Depression    Gastritis    GERD (gastroesophageal reflux disease)    Hashimoto's thyroiditis    Hx    Hyperlipidemia    Hypertension    Hypothyroidism    Normocytic anemia 2009 Hb 9.9-11.1   Osteopenia    Shoulder pain    Urinary incontinence     Past Surgical History:  Procedure Laterality Date   Bilateral tubal ligation     BIOPSY  04/21/2020   Procedure: BIOPSY;  Surgeon: Daneil Dolin, MD;  [facility];  Service: Endoscopy;;   BREAST EXCISIONAL BIOPSY  2010   Excisional biopsy of benign left breast mass -lopoma    BREAST SURGERY     left nreast biopsy   COLONOSCOPY  08/26/2011   SLF: 1. Internal hemorrhoids   COLONOSCOPY N/A 10/06/2015   [id number] fissure or internal hemorrhoids/mild sigmoid colitis. benign colonic path    COLONOSCOPY WITH PROPOFOL N/A 03/15/2021   Procedure: COLONOSCOPY WITH PROPOFOL;  Surgeon: Eloise Harman, DO;  [facility];  Service: Endoscopy;  Laterality: N/A;  3:00pm   colonscopy  2005   Dr. Tamala Julian   ESOPHAGOGASTRODUODENOSCOPY  12/2009   chronic gastritis   ESOPHAGOGASTRODUODENOSCOPY N/A 05/17/2017   Dr. Oneida Alar; gastritis, normal small bowel  biopsies, multiple gastric polyps. fundic gland polyps   ESOPHAGOGASTRODUODENOSCOPY (EGD) WITH PROPOFOL N/A 04/21/2020   Rourk: multiple gastric polyps, biopsy of one c/w fundic gland. gastric biopsy showed mild chronic gastritis but no H.pylori   GIVENS CAPSULE STUDY N/A 05/30/2017   gastritis, no source for anemia identified   left knee surgery Left March 1,2017   PARTIAL KNEE ARTHROPLASTY Left 08/09/2017   Procedure: LEFT UNICOMPARTMENTAL KNEE;  Surgeon: Gaynelle Arabian, MD;  [facility];  Service: Orthopedics;  Laterality: Left;   POLYPECTOMY  04/21/2020   Procedure: POLYPECTOMY;  Surgeon: Daneil Dolin, MD;  [facility];  Service: Endoscopy;;  gastric   Resection of left lobe of thyroid     right carpal tunnel release     rt. knee athroscopy  2004   SAVORY DILATION N/A 05/17/2017   Procedure: SAVORY DILATION;  Surgeon: Danie Binder, MD;  [facility];  Service: Endoscopy;  Laterality: N/A;   TOTAL ABDOMINAL HYSTERECTOMY  [id number]   UMBILICAL HERNIA REPAIR     Urethral dilation for stenosis  2009    There were no vitals filed for this visit.    Subjective Assessment - 12/07/21 1003     Subjective Patient presents to therapy with complaint  of arm pain. She reports a heat in her wrist area and up her arm, it starts on the LT and continues into the RT side. This bothers her most at bed time. She does have intermittent numbness in fingers. She has had carpal tunnel surgery on right wrist, but still has this heat sensation. She also notes some indigestion following. She had recent injection sin wrist area but this was not helpful. She had neck xrays which showed spondylosis in lower levels C5-7.    Limitations Sitting;House hold activities    Patient Stated Goals "this heat to stop"    Currently in Pain? No/denies                [facility] PT Assessment - 12/07/21 0001       Assessment   Medical Diagnosis Neck pain    Referring Provider (PT) Edmonia Lynch MD    Prior Therapy Yes      Precautions   Precautions None      Restrictions   Weight Bearing Restrictions No      Balance Screen   Has the patient fallen in the past 6 months Yes    How many times? 1    Has the patient had a decrease in activity level because of a fear of falling?  No    Is the patient reluctant to leave their home because of a fear of falling?  No      Home Ec[employer] residence      Prior Function   Level of Independence Independent      Cognition   Overall Cognitive Status Within Functional Limits for tasks assessed      Observation/Other Assessments   Focus on Therapeutic Outcomes (FOTO)  68% function      Posture/Postural Control   Posture/Postural Control Postural limitations    Postural Limitations Rounded Shoulders;Forward head;Increased thoracic kyphosis      ROM / Strength   AROM / PROM / Strength AROM;Strength      AROM   AROM Assessment Site Cervical    Cervical Flexion 38    Cervical Extension 30    Cervical - Right Rotation 65    Cervical - Left Rotation 65      Strength   Strength Assessment Site Shoulder    Right/Left Shoulder Right;Left    Right Shoulder Flexion 5/5    Right Shoulder ABduction 5/5    Right Shoulder External Rotation 5/5    Left Shoulder Flexion 5/5    Left Shoulder ABduction 5/5    Left Shoulder External Rotation 5/5      Flexibility   Soft Tissue Assessment /Muscle Length --   Mod restriction in LT upper trap     Palpation   Palpation comment Mod/ max TTP about LT upper trap, scalenes, levator                        Objective measurements completed on examination: See above findings.       [facility] PT Treatment/Exercise - 12/07/21 0001       Exercises   Exercises Shoulder      Shoulder Exercises: Seated   Other Seated Exercises chin tuck x10, scapular retraction x10, UT stretch 20 seconds each                     PT Education  - 12/07/21 1003     Education Details on evaluation findings,  POC and HEP    Person(s) Educated Patient    Methods Explanation;Handout    Comprehension Verbalized understanding              PT Short Term Goals - 12/07/21 1023       PT SHORT TERM GOAL #1   Title Patient will be independent with initial HEP and self-management strategies to improve functional outcomes    Time 2    Period Weeks    Status New    Target Date 12/21/21               PT Long Term Goals - 12/07/21 1023       PT LONG TERM GOAL #1   Title Patient will be independent with advanced HEP and self-management strategies to improve functional outcomes    Time 4    Period Weeks    Status New    Target Date 01/04/22      PT LONG TERM GOAL #2   Title Patient will report at least 75% overall improvement in subjective complaint to indicate improvement in ability to perform ADLs.    Time 4    Period Weeks    Status New    Target Date 01/04/22      PT LONG TERM GOAL #3   Title Patient will improve FOTO score to predicted value to indicate improvement in functional outcomes    Time 4    Period Weeks    Status New    Target Date 01/04/22                    Plan - 12/07/21 1014     Clinical Impression Statement Patient is a 71 y.o. female who presents to physical therapy with complaint of cervical radiculopathy. Patient demonstrates ROM restriction, reduced flexibility, increased tenderness to palpation and postural abnormalities which are likely contributing to symptoms of pain and are negatively impacting patient ability to perform ADLs. Patient will benefit from skilled physical therapy services to address these deficits to reduce pain and improve level of function with ADLs    Examination-Activity Limitations Reach Overhead;Lift;Sleep;Sit;Carry    Examination-Participation Restrictions Cleaning;Shop;Community Activity;Dorita Sciara    Stability/Clinical Decision Making  Stable/Uncomplicated    Clinical Decision Making Low    Rehab Potential Good    PT Frequency 2x / week    PT Duration 4 weeks    PT Treatment/Interventions ADLs/Self Care Home Management;Biofeedback;DME Instruction;Contrast Bath;Fluidtherapy;Ultrasound;Neuromuscular re-education;Parrafin;Compression bandaging;Scar mobilization;Visual/perceptual remediation/compensation;Spinal Manipulations;Passive range of motion;Patient/family education;Orthotic Fit/Training;Functional mobility training;Cryotherapy;Iontophoresis 4mg /ml Dexamethasone;Electrical Stimulation;Therapeutic activities;Manual lymph drainage;Manual techniques;Energy conservation;Dry needling;Joint Manipulations;Splinting;Taping;Vasopneumatic Device;Therapeutic exercise;Moist Heat;Traction;Balance training    PT Next Visit Plan Progress postural strength as tolerated. Manual to upper trap, scalenes, possible thoracic outlet syndrome    PT Home Exercise Plan Eval: chin tuck, scap retraction, UT stretch    Consulted and Agree with Plan of Care Patient             Patient will benefit from skilled therapeutic intervention in order to improve the following deficits and impairments:  Impaired sensation, Increased fascial restricitons, Impaired UE functional use, Pain, Decreased range of motion, Impaired flexibility, Postural dysfunction, Improper body mechanics  Visit Diagnosis: Cervicalgia  Abnormal posture     Problem List Patient Active Problem List   Diagnosis Date Noted   Rib pain on right side 11/03/2021   Neck pain 07/05/2021   Unsteady gait when walking 06/02/2021   Numbness and tingling in both hands 06/02/2021   Thumb pain, left 04/22/2021  Flushing 10/19/2020   Depression, major, single episode, severe ([place]) 06/21/2020   Hot flashes 03/11/2020   Vitamin D deficiency 03/11/2020   Sensation of feeling hot 03/05/2020   Lumbar spine painful on movement 12/02/2019   Obesity (BMI 30.0-34.9) 06/17/2019   Sore throat  11/06/2018   History of arthroplasty of left knee 01/04/2018   OA (osteoarthritis) of knee 08/09/2017   Epigastric pain 03/02/2017   Headache 01/06/2017   Constipation 02/05/2016   Allergic rhinitis 10/08/2014   GAD (generalized anxiety disorder) 09/23/2014   Annual physical exam 06/05/2014   Back pain with radiculopathy 04/14/2014   Right shoulder pain 12/19/2011   POSTSURGICAL HYPOTHYROIDISM 06/22/2010   Normocytic anemia 08/04/2009   Gastroesophageal reflux disease 04/20/2009   Hyperlipidemia LDL goal <100 05/16/2008   Essential hypertension 05/16/2008   OSTEOPENIA 05/16/2008   Urinary incontinence 05/16/2008   10:31 AM, 12/07/21 Josue Hector PT DPT  Physical Therapist with [facility]  ([phone] [place], [place], [address] Phone: [phone]   Fax:  [phone]  Name: Laura [name] MRN: [id number] Date of Birth: 1951-01-21

## 2021-12-08 ENCOUNTER — Encounter (HOSPITAL_COMMUNITY): Payer: Self-pay | Admitting: Physical Therapy

## 2021-12-08 ENCOUNTER — Ambulatory Visit (HOSPITAL_COMMUNITY): Payer: Medicare Other

## 2021-12-08 DIAGNOSIS — M5416 Radiculopathy, lumbar region: Secondary | ICD-10-CM

## 2021-12-08 DIAGNOSIS — M542 Cervicalgia: Secondary | ICD-10-CM | POA: Diagnosis not present

## 2021-12-08 DIAGNOSIS — R2689 Other abnormalities of gait and mobility: Secondary | ICD-10-CM | POA: Diagnosis not present

## 2021-12-08 DIAGNOSIS — R293 Abnormal posture: Secondary | ICD-10-CM

## 2021-12-08 NOTE — Therapy (Signed)
[facility] [address] [place], [place], [address] Phone: [phone]   Fax:  [phone]  Physical Therapy Treatment  Patient Details  Name: Laura [name] MRN: [id number] Date of Birth: 1951-04-28 Referring Provider (PT): Margarita Rana MD   Encounter Date: 12/08/2021   PT End of Session - 12/08/21 1122     Visit Number 2    Number of Visits 8    Date for PT Re-Evaluation 01/04/22    Authorization Type [employer] M[employer]    PT Start Time 1118    PT Stop Time 1158    PT Time Calculation (min) 40 min    Activity Tolerance Patient tolerated treatment well    Behavior During Therapy [facility] for tasks assessed/performed             Past Medical History:  Diagnosis Date   Anxiety    At low risk for fall 08/15/2018   Cystitis    Depression    Gastritis    GERD (gastroesophageal reflux disease)    Hashimoto's thyroiditis    Hx    Hyperlipidemia    Hypertension    Hypothyroidism    Normocytic anemia 2009 Hb 9.9-11.1   Osteopenia    Shoulder pain    Urinary incontinence     Past Surgical History:  Procedure Laterality Date   Bilateral tubal ligation     BIOPSY  04/21/2020   Procedure: BIOPSY;  Surgeon: Corbin Ade, MD;  [facility];  Service: Endoscopy;;   BREAST EXCISIONAL BIOPSY  2010   Excisional biopsy of benign left breast mass -lopoma    BREAST SURGERY     left nreast biopsy   COLONOSCOPY  08/26/2011   SLF: 1. Internal hemorrhoids   COLONOSCOPY N/A 10/06/2015   [id number] fissure or internal hemorrhoids/mild sigmoid colitis. benign colonic path    COLONOSCOPY WITH PROPOFOL N/A 03/15/2021   Procedure: COLONOSCOPY WITH PROPOFOL;  Surgeon: [name], DO;  [facility];  Service: Endoscopy;  Laterality: N/A;  3:00pm   colonscopy  2005   Dr. Katrinka Blazing   ESOPHAGOGASTRODUODENOSCOPY  12/2009   chronic gastritis   ESOPHAGOGASTRODUODENOSCOPY N/A 05/17/2017   Dr. Darrick Penna; gastritis, normal small bowel  biopsies, multiple gastric polyps. fundic gland polyps   ESOPHAGOGASTRODUODENOSCOPY (EGD) WITH PROPOFOL N/A 04/21/2020   Rourk: multiple gastric polyps, biopsy of one c/w fundic gland. gastric biopsy showed mild chronic gastritis but no H.pylori   GIVENS CAPSULE STUDY N/A 05/30/2017   gastritis, no source for anemia identified   left knee surgery Left March 1,2017   PARTIAL KNEE ARTHROPLASTY Left 08/09/2017   Procedure: LEFT UNICOMPARTMENTAL KNEE;  Surgeon: Ollen Gross, MD;  [facility];  Service: Orthopedics;  Laterality: Left;   POLYPECTOMY  04/21/2020   Procedure: POLYPECTOMY;  Surgeon: Corbin Ade, MD;  [facility];  Service: Endoscopy;;  gastric   Resection of left lobe of thyroid     right carpal tunnel release     rt. knee athroscopy  2004   SAVORY DILATION N/A 05/17/2017   Procedure: SAVORY DILATION;  Surgeon: West Bali, MD;  [facility];  Service: Endoscopy;  Laterality: N/A;   TOTAL ABDOMINAL HYSTERECTOMY  1994   UMBILICAL HERNIA REPAIR     Urethral dilation for stenosis  2009    There were no vitals filed for this visit.   Subjective Assessment - 12/08/21 1121     Subjective Pt stated she is feeling good today,  no reports of pain today.  Reports some difficulty with chin tucks.    Patient Stated Goals "this heat to stop"    Currently in Pain? No/denies                               [facility] Adult PT Treatment/Exercise - 12/08/21 0001       Posture/Postural Control   Posture/Postural Control Postural limitations    Postural Limitations Rounded Shoulders;Forward head;Increased thoracic kyphosis      Exercises   Exercises Shoulder      Shoulder Exercises: Supine   Other Supine Exercises cervical retraction 10x 3      Shoulder Exercises: Seated   Other Seated Exercises chin tuck with L hand for mechanics 10x3"    Other Seated Exercises scapular retraction 10x; wback 10x      Shoulder Exercises: Standing    Other Standing Exercises chin tuck against wall 10x 3"      Manual Therapy   Manual Therapy Soft tissue mobilization    Manual therapy comments Manual complete separate than rest of tx    Soft tissue mobilization STM in supine to UT, levator LE elevated                     PT Education - 12/08/21 1139     Education Details Reviewed goals, educated importance of HEP compliance for maximal benefits, reviewed current HEP with cueing to improve chin tuck mechanics.    Person(s) Educated Patient    Methods Explanation;Tactile cues;Verbal cues;Demonstration;Handout    Comprehension Verbalized understanding;Need further instruction;Returned demonstration              PT Short Term Goals - 12/07/21 1023       PT SHORT TERM GOAL #1   Title Patient will be independent with initial HEP and self-management strategies to improve functional outcomes    Time 2    Period Weeks    Status New    Target Date 12/21/21               PT Long Term Goals - 12/07/21 1023       PT LONG TERM GOAL #1   Title Patient will be independent with advanced HEP and self-management strategies to improve functional outcomes    Time 4    Period Weeks    Status New    Target Date 01/04/22      PT LONG TERM GOAL #2   Title Patient will report at least 75% overall improvement in subjective complaint to indicate improvement in ability to perform ADLs.    Time 4    Period Weeks    Status New    Target Date 01/04/22      PT LONG TERM GOAL #3   Title Patient will improve FOTO score to predicted value to indicate improvement in functional outcomes    Time 4    Period Weeks    Status New    Target Date 01/04/22                   Plan - 12/08/21 1600     Clinical Impression Statement Reviewed goals, educated importance of HEP compliance for maximal benefits, pt able to recall and demonstrate with some cueing to improve cervical retraction.  Improved mechanics with supine and  standing chin tucks following cueing.  Added wback and supine/standing chin tucks to HEP with printout given.  EOS with manual STM to address UT and levator scapula restrictions, pt reports increased ease with head rotation and no pain.  Encouraged hydration following massage to reduce risk of headache following manual.    Examination-Activity Limitations Reach Overhead;Lift;Sleep;Sit;Carry    Examination-Participation Restrictions Cleaning;Shop;Community Activity;Aaron Mose    Stability/Clinical Decision Making Stable/Uncomplicated    Clinical Decision Making Low    Rehab Potential Good    PT Frequency 2x / week    PT Duration 4 weeks    PT Treatment/Interventions ADLs/Self Care Home Management;Biofeedback;DME Instruction;Contrast Bath;Fluidtherapy;Ultrasound;Neuromuscular re-education;Parrafin;Compression bandaging;Scar mobilization;Visual/perceptual remediation/compensation;Spinal Manipulations;Passive range of motion;Patient/family education;Orthotic Fit/Training;Functional mobility training;Cryotherapy;Iontophoresis 4mg /ml Dexamethasone;Electrical Stimulation;Therapeutic activities;Manual lymph drainage;Manual techniques;Energy conservation;Dry needling;Joint Manipulations;Splinting;Taping;Vasopneumatic Device;Therapeutic exercise;Moist Heat;Traction;Balance training    PT Next Visit Plan Progress postural strength as tolerated. Manual to upper trap, scalenes, possible thoracic outlet syndrome    PT Home Exercise Plan Eval: chin tuck, scap retraction, UT stretch; 2/8: supine and standing against wall chin tuck and WBack    Consulted and Agree with Plan of Care Patient             Patient will benefit from skilled therapeutic intervention in order to improve the following deficits and impairments:  Impaired sensation, Increased fascial restricitons, Impaired UE functional use, Pain, Decreased range of motion, Impaired flexibility, Postural dysfunction, Improper body mechanics  Visit  Diagnosis: Cervicalgia  Abnormal posture  Other abnormalities of gait and mobility  Radiculopathy, lumbar region     Problem List Patient Active Problem List   Diagnosis Date Noted   Rib pain on right side 11/03/2021   Neck pain 07/05/2021   Unsteady gait when walking 06/02/2021   Numbness and tingling in both hands 06/02/2021   Thumb pain, left 04/22/2021   Flushing 10/19/2020   Depression, major, single episode, severe ([facility]) 06/21/2020   Hot flashes 03/11/2020   Vitamin D deficiency 03/11/2020   Sensation of feeling hot 03/05/2020   Lumbar spine painful on movement 12/02/2019   Obesity (BMI 30.0-34.9) 06/17/2019   Sore throat 11/06/2018   History of arthroplasty of left knee 01/04/2018   OA (osteoarthritis) of knee 08/09/2017   Epigastric pain 03/02/2017   Headache 01/06/2017   Constipation 02/05/2016   Allergic rhinitis 10/08/2014   GAD (generalized anxiety disorder) 09/23/2014   Annual physical exam 06/05/2014   Back pain with radiculopathy 04/14/2014   Right shoulder pain 12/19/2011   POSTSURGICAL HYPOTHYROIDISM 06/22/2010   Normocytic anemia 08/04/2009   Gastroesophageal reflux disease 04/20/2009   Hyperlipidemia LDL goal <100 05/16/2008   Essential hypertension 05/16/2008   OSTEOPENIA 05/16/2008   Urinary incontinence 05/16/2008   Becky Sax, LPTA/CLT; CBIS [phone]  Juel Burrow, PTA 12/08/2021, 4:05 PM  [facility] [address] [place], [place], [address] Phone: [phone]   Fax:  [phone]  Name: Laura [name] MRN: [id number] Date of Birth: 1951/01/10

## 2021-12-13 ENCOUNTER — Encounter: Payer: Self-pay | Admitting: Family Medicine

## 2021-12-14 ENCOUNTER — Ambulatory Visit
Admission: EM | Admit: 2021-12-14 | Discharge: 2021-12-14 | Disposition: A | Discharge status: Home / Self Care | Payer: Medicare Other | Attending: Student | Admitting: Student

## 2021-12-14 ENCOUNTER — Ambulatory Visit (HOSPITAL_COMMUNITY): Payer: Medicare Other

## 2021-12-14 ENCOUNTER — Encounter (HOSPITAL_COMMUNITY): Payer: Self-pay

## 2021-12-14 ENCOUNTER — Other Ambulatory Visit: Payer: Self-pay

## 2021-12-14 DIAGNOSIS — M5412 Radiculopathy, cervical region: Secondary | ICD-10-CM | POA: Diagnosis not present

## 2021-12-14 DIAGNOSIS — M542 Cervicalgia: Secondary | ICD-10-CM

## 2021-12-14 DIAGNOSIS — R293 Abnormal posture: Secondary | ICD-10-CM

## 2021-12-14 DIAGNOSIS — R2689 Other abnormalities of gait and mobility: Secondary | ICD-10-CM

## 2021-12-14 DIAGNOSIS — M5416 Radiculopathy, lumbar region: Secondary | ICD-10-CM | POA: Diagnosis not present

## 2021-12-14 MED ORDER — GABAPENTIN 300 MG PO CAPS: 300.0000 mg | ORAL_CAPSULE | Freq: Every day | ORAL | 0 refills | Status: DC

## 2021-12-14 NOTE — ED Provider Notes (Signed)
[facility]    CSN: [id number] Arrival date & time: 12/14/21  [id number]      History   Chief Complaint Chief Complaint  Patient presents with   Arm Pain    HPI Skip EstimableJudy C Howes is a 71 y.o. female presenting with left arm burning.  Medical history cervicalgia and neuropathy for years.  States the symptoms are unchanged, they have been present for years, but she now requires relief.  She is followed by neurology and PT for this.  Describes burning pain radiating down from her neck to her left wrist for years.  Poorly controlled on gabapentin at night.  Has also failed prednisone in the past.  Denies new weakness.  Denies current neck pain.  States she also has a diagnosis of carpal tunnel, has had surgery for this on the right wrist but not the left yet.  HPI  Past Medical History:  Diagnosis Date   Anxiety    At low risk for fall 08/15/2018   Cystitis    Depression    Gastritis    GERD (gastroesophageal reflux disease)    Hashimoto's thyroiditis    Hx    Hyperlipidemia    Hypertension    Hypothyroidism    Normocytic anemia 2009 Hb 9.9-11.1   Osteopenia    Shoulder pain    Urinary incontinence     Patient Active Problem List   Diagnosis Date Noted   Rib pain on right side 11/03/2021   Neck pain 07/05/2021   Unsteady gait when walking 06/02/2021   Numbness and tingling in both hands 06/02/2021   Thumb pain, left 04/22/2021   Flushing 10/19/2020   Depression, major, single episode, severe (HCC) 06/21/2020   Hot flashes 03/11/2020   Vitamin D deficiency 03/11/2020   Sensation of feeling hot 03/05/2020   Lumbar spine painful on movement 12/02/2019   Obesity (BMI 30.0-34.9) 06/17/2019   Sore throat 11/06/2018   History of arthroplasty of left knee 01/04/2018   OA (osteoarthritis) of knee 08/09/2017   Epigastric pain 03/02/2017   Headache 01/06/2017   Constipation 02/05/2016   Allergic rhinitis 10/08/2014   GAD (generalized anxiety disorder) 09/23/2014    Annual physical exam 06/05/2014   Back pain with radiculopathy 04/14/2014   Right shoulder pain 12/19/2011   POSTSURGICAL HYPOTHYROIDISM 06/22/2010   Normocytic anemia 08/04/2009   Gastroesophageal reflux disease 04/20/2009   Hyperlipidemia LDL goal <100 05/16/2008   Essential hypertension 05/16/2008   OSTEOPENIA 05/16/2008   Urinary incontinence 05/16/2008    Past Surgical History:  Procedure Laterality Date   Bilateral tubal ligation     BIOPSY  04/21/2020   Procedure: BIOPSY;  Surgeon: Corbin Adeourk, Robert M, MD;  [facility];  Service: Endoscopy;;   BREAST EXCISIONAL BIOPSY  2010   Excisional biopsy of benign left breast mass -lopoma    BREAST SURGERY     left nreast biopsy   COLONOSCOPY  08/26/2011   SLF: 1. Internal hemorrhoids   COLONOSCOPY N/A 10/06/2015   JXB:JYNWSLF:anal fissure or internal hemorrhoids/mild sigmoid colitis. benign colonic path    COLONOSCOPY WITH PROPOFOL N/A 03/15/2021   Procedure: COLONOSCOPY WITH PROPOFOL;  Surgeon: Lanelle Balarver, Charles K, DO;  [facility];  Service: Endoscopy;  Laterality: N/A;  3:00pm   colonscopy  2005   Dr. Katrinka BlazingSmith   ESOPHAGOGASTRODUODENOSCOPY  12/2009   chronic gastritis   ESOPHAGOGASTRODUODENOSCOPY N/A 05/17/2017   Dr. Darrick PennaFields; gastritis, normal small bowel biopsies, multiple gastric polyps. fundic gland polyps   ESOPHAGOGASTRODUODENOSCOPY (EGD) WITH  PROPOFOL N/A 04/21/2020   Rourk: multiple gastric polyps, biopsy of one c/w fundic gland. gastric biopsy showed mild chronic gastritis but no H.pylori   GIVENS CAPSULE STUDY N/A 05/30/2017   gastritis, no source for anemia identified   left knee surgery Left March 1,2017   PARTIAL KNEE ARTHROPLASTY Left 08/09/2017   Procedure: LEFT UNICOMPARTMENTAL KNEE;  Surgeon: Ollen Gross, MD;  Location: [facility];  Service: Orthopedics;  Laterality: Left;   POLYPECTOMY  04/21/2020   Procedure: POLYPECTOMY;  Surgeon: Corbin Ade, MD;  [facility];  Service: Endoscopy;;  gastric    Resection of left lobe of thyroid     right carpal tunnel release     rt. knee athroscopy  2004   SAVORY DILATION N/A 05/17/2017   Procedure: SAVORY DILATION;  Surgeon: West Bali, MD;  [facility];  Service: Endoscopy;  Laterality: N/A;   TOTAL ABDOMINAL HYSTERECTOMY  1994   UMBILICAL HERNIA REPAIR     Urethral dilation for stenosis  2009    OB History     Gravida  3   Para  3   Term  3   Preterm      AB      Living  3      SAB      IAB      Ectopic      Multiple      Live Births  3            Home Medications    Prior to Admission medications   Medication Sig Start Date End Date Taking? Authorizing Provider  gabapentin (NEURONTIN) 300 MG capsule Take 1 capsule (300 mg total) by mouth at bedtime. 12/14/21  Yes Rhys Martini, PA-C  acetaminophen (TYLENOL) 500 MG tablet Take 1 tablet (500 mg total) by mouth every 6 (six) hours as needed. Patient taking differently: Take 1,500 mg by mouth daily as needed for mild pain or headache. 11/15/19   Avegno, Zachery Dakins, FNP  amLODipine (NORVASC) 2.5 MG tablet TAKE 1 TABLET BY MOUTH  DAILY 10/07/21   Donell Beers, FNP  atorvastatin (LIPITOR) 80 MG tablet TAKE 1 TABLET BY MOUTH  DAILY 11/25/21   Kerri Perches, MD  citalopram (CELEXA) 40 MG tablet TAKE 1 TABLET BY MOUTH  DAILY 06/04/21   Kerri Perches, MD  gabapentin (NEURONTIN) 100 MG capsule TAKE 1 CAPSULE BY MOUTH AT  BEDTIME 07/16/21   Kerri Perches, MD  hydrochlorothiazide (HYDRODIURIL) 25 MG tablet TAKE 1 TABLET BY MOUTH  DAILY 07/16/21   Kerri Perches, MD  hydrocortisone (ANUSOL-HC) 2.5 % rectal cream Place 1 application rectally 2 (two) times daily. 10/13/21   Gelene Mink, NP  Lactobacillus Rhamnosus, GG, (CULTURELLE PO) Take by mouth daily.    [provider]  levothyroxine (SYNTHROID) 88 MCG tablet Take 1 tablet (88 mcg total) by mouth daily before breakfast. 07/19/21   Nida, Denman George, MD  lisinopril  (ZESTRIL) 40 MG tablet Take 40 mg by mouth daily.    [provider]  montelukast (SINGULAIR) 10 MG tablet TAKE 1 TABLET BY MOUTH AT  BEDTIME 08/17/21   Kerri Perches, MD  Multiple Vitamin (MULTIVITAMIN WITH MINERALS) TABS tablet Take 1 tablet by mouth daily.    [provider]  omeprazole (PRILOSEC) 20 MG capsule TAKE 1 CAPSULE(20 MG) BY MOUTH TWICE DAILY BEFORE A MEAL 08/03/21   Tiffany Kocher, PA-C  Polyethyl Glycol-Propyl Glycol (SYSTANE OP) Place 1  drop into both eyes daily as needed (dry eyes).    [provider]  Potassium 99 MG TABS Take 99 mg by mouth 2 (two) times daily.    [provider]  traMADol (ULTRAM) 50 MG tablet Take 50 mg by mouth every 8 (eight) hours as needed for moderate pain.    [provider]    Family History Family History  Problem Relation Age of Onset   Pneumonia Mother    Healthy Father    Colon cancer Neg Hx    Anesthesia problems Neg Hx    Hypotension Neg Hx    Malignant hyperthermia Neg Hx    Pseudochol deficiency Neg Hx     Social History Social History   Tobacco Use   Smoking status: Former    Packs/day: 0.25    Years: 1.00    Pack years: 0.25    Types: Cigarettes    Quit date: 08/25/1972    Years since quitting: 49.3   Smokeless tobacco: Never  Vaping Use   Vaping Use: Never used  Substance Use Topics   Alcohol use: No   Drug use: No     Allergies   Cozaar [losartan potassium], Dilaudid [hydromorphone hcl], Levaquin [levofloxacin], Nsaids, and Promethazine   Review of Systems Review of Systems  Musculoskeletal:        L arm burning     Physical Exam Triage Vital Signs ED Triage Vitals  Enc Vitals Group     BP 12/14/21 0949 (!) 157/80     Pulse Rate 12/14/21 0949 71     Resp 12/14/21 0949 18     Temp 12/14/21 0949 98.2 F (36.8 C)     Temp src --      SpO2 12/14/21 0949 96 %     Weight --      Height --      Head Circumference --      Peak Flow --      Pain Score  12/14/21 0948 4     Pain Loc --      Pain Edu? --      Excl. in GC? --    No data found.  Updated Vital Signs BP (!) 157/80    Pulse 71    Temp 98.2 F (36.8 C)    Resp 18    SpO2 96%   Visual Acuity Right Eye Distance:   Left Eye Distance:   Bilateral Distance:    Right Eye Near:   Left Eye Near:    Bilateral Near:     Physical Exam Vitals reviewed.  Constitutional:      General: She is not in acute distress.    Appearance: Normal appearance. She is not ill-appearing.  HENT:     Head: Normocephalic and atraumatic.  Pulmonary:     Effort: Pulmonary effort is normal.  Musculoskeletal:     Comments: There is tenderness to palpation along the left proximal trapezius muscle.  No tenderness to palpation of the left arm or wrist.  Pain elicited with abduction left arm, and positive left Spurling.  Strength and sensation grossly intact.  Negative Phalen sign.  Radial pulse 2+, cap refill less than 2 seconds bilaterally.  Neurological:     General: No focal deficit present.     Mental Status: She is alert and oriented to person, place, and time.  Psychiatric:        Mood and Affect: Mood normal.        Behavior: Behavior  normal.        Thought Content: Thought content normal.        Judgment: Judgment normal.     UC Treatments / Results  Labs (all labs ordered are listed, but only abnormal results are displayed) Labs Reviewed - No data to display  EKG   Radiology No results found.  Procedures Procedures (including critical care time)  Medications Ordered in UC Medications - No data to display  Initial Impression / Assessment and Plan / UC Course  I have reviewed the triage vital signs and the nursing notes.  Pertinent labs & imaging results that were available during my care of the patient were reviewed by me and considered in my medical decision making (see chart for details).     This patient is a very pleasant 71 y.o. year old female presenting with  cervical radiculitis. No new trauma or overuse. Symptoms for years, followed by neuro and PT for this. Currently taking gabapentin 100mg  qPM; has failed prednisone in the past. Will increase her gabapentin to 300mg  qPM until she can f/u with neuro. ED return precautions discussed. Patient verbalizes understanding and agreement.    Final Clinical Impressions(s) / UC Diagnoses   Final diagnoses:  Cervicalgia  Cervical radiculitis     Discharge Instructions      -Increase gabapentin to 300mg  at night. You can either take 3 of the 100mg  pills you have already, or start the 300mg  pills I sent  -Follow-up with your specialist for further management.   ED Prescriptions     Medication Sig Dispense Auth. Provider   gabapentin (NEURONTIN) 300 MG capsule Take 1 capsule (300 mg total) by mouth at bedtime. 30 capsule , PA-C      PDMP not reviewed this encounter.   , PA-C 12/14/21 1028

## 2021-12-14 NOTE — Therapy (Signed)
[facility] [address] [place], [place], [address] Phone: [phone]   Fax:  [phone]  Physical Therapy Treatment  Patient Details  Name: Laura [name] MRN: [id number] Date of Birth: 07-05-1951 Referring Provider (PT): Margarita Rana MD   Encounter Date: 12/14/2021   PT End of Session - 12/14/21 1253     Visit Number 3    Number of Visits 8    Date for PT Re-Evaluation 01/04/22    Authorization Type [employer] M[employer]    PT Start Time 1300    PT Stop Time 1347    PT Time Calculation (min) 47 min    Activity Tolerance Patient tolerated treatment well    Behavior During Therapy [facility] for tasks assessed/performed             Past Medical History:  Diagnosis Date   Anxiety    At low risk for fall 08/15/2018   Cystitis    Depression    Gastritis    GERD (gastroesophageal reflux disease)    Hashimoto's thyroiditis    Hx    Hyperlipidemia    Hypertension    Hypothyroidism    Normocytic anemia 2009 Hb 9.9-11.1   Osteopenia    Shoulder pain    Urinary incontinence     Past Surgical History:  Procedure Laterality Date   Bilateral tubal ligation     BIOPSY  04/21/2020   Procedure: BIOPSY;  Surgeon: Corbin Ade, MD;  [facility];  Service: Endoscopy;;   BREAST EXCISIONAL BIOPSY  2010   Excisional biopsy of benign left breast mass -lopoma    BREAST SURGERY     left nreast biopsy   COLONOSCOPY  08/26/2011   SLF: 1. Internal hemorrhoids   COLONOSCOPY N/A 10/06/2015   SEG:BTDV fissure or internal hemorrhoids/mild sigmoid colitis. benign colonic path    COLONOSCOPY WITH PROPOFOL N/A 03/15/2021   Procedure: COLONOSCOPY WITH PROPOFOL;  Surgeon: [name], DO;  [facility];  Service: Endoscopy;  Laterality: N/A;  3:00pm   colonscopy  2005   Dr. Katrinka Blazing   ESOPHAGOGASTRODUODENOSCOPY  12/2009   chronic gastritis   ESOPHAGOGASTRODUODENOSCOPY N/A 05/17/2017   Dr. Darrick Penna; gastritis, normal small bowel  biopsies, multiple gastric polyps. fundic gland polyps   ESOPHAGOGASTRODUODENOSCOPY (EGD) WITH PROPOFOL N/A 04/21/2020   Rourk: multiple gastric polyps, biopsy of one c/w fundic gland. gastric biopsy showed mild chronic gastritis but no H.pylori   GIVENS CAPSULE STUDY N/A 05/30/2017   gastritis, no source for anemia identified   left knee surgery Left March 1,2017   PARTIAL KNEE ARTHROPLASTY Left 08/09/2017   Procedure: LEFT UNICOMPARTMENTAL KNEE;  Surgeon: Ollen Gross, MD;  Location: [facility];  Service: Orthopedics;  Laterality: Left;   POLYPECTOMY  04/21/2020   Procedure: POLYPECTOMY;  Surgeon: Corbin Ade, MD;  [facility];  Service: Endoscopy;;  gastric   Resection of left lobe of thyroid     right carpal tunnel release     rt. knee athroscopy  2004   SAVORY DILATION N/A 05/17/2017   Procedure: SAVORY DILATION;  Surgeon: West Bali, MD;  [facility];  Service: Endoscopy;  Laterality: N/A;   TOTAL ABDOMINAL HYSTERECTOMY  1994   UMBILICAL HERNIA REPAIR     Urethral dilation for stenosis  2009    There were no vitals filed for this visit.   Subjective Assessment - 12/14/21 1253     Subjective Pt arrives for today's treatment session reporting  6/10 right shoulder and neck pain.  Pt states she went to urgent care today due to pain and burning.    Patient Stated Goals "this heat to stop"    Currently in Pain? Yes    Pain Score 6     Pain Location Neck    Pain Orientation Left                               [facility] Adult PT Treatment/Exercise - 12/14/21 0001       Exercises   Exercises Shoulder;Neck      Neck Exercises: Theraband   Shoulder Extension Red;15 reps    Rows Red;15 reps      Neck Exercises: Seated   Neck Retraction 10 reps   cues for technique   Cervical Rotation Both;10 reps    Lateral Flexion Both;10 reps    Shoulder Shrugs 10 reps    Shoulder Rolls 10 reps;Forwards;Backwards    Other Seated Exercise cervical  flexion and extension 10 reps each      Modalities   Modalities Primary school teacher Stimulation Location Left posterior shoulder    Electrical Stimulation Action IFC    Electrical Stimulation Parameters 80-150 Hz x 15 mins    Electrical Stimulation Goals Pain;Tone      Manual Therapy   Manual Therapy Soft tissue mobilization    Soft tissue mobilization STW/M in seated to left upper trap and posterior shoulder musculature                       PT Short Term Goals - 12/07/21 1023       PT SHORT TERM GOAL #1   Title Patient will be independent with initial HEP and self-management strategies to improve functional outcomes    Time 2    Period Weeks    Status New    Target Date 12/21/21               PT Long Term Goals - 12/07/21 1023       PT LONG TERM GOAL #1   Title Patient will be independent with advanced HEP and self-management strategies to improve functional outcomes    Time 4    Period Weeks    Status New    Target Date 01/04/22      PT LONG TERM GOAL #2   Title Patient will report at least 75% overall improvement in subjective complaint to indicate improvement in ability to perform ADLs.    Time 4    Period Weeks    Status New    Target Date 01/04/22      PT LONG TERM GOAL #3   Title Patient will improve FOTO score to predicted value to indicate improvement in functional outcomes    Time 4    Period Weeks    Status New    Target Date 01/04/22                   Plan - 12/14/21 1253     Clinical Impression Statement Pt arrives for today's treatment session reporting 6/10 left shoulder and neck pain.  Pt instructed in seated cervical and shoulder exercises to increase strength and function.  Pt requiring min cues for posture and proper technique.  Pt reported increased pain with forward shoulder rolls.  STW/M performed to left upper traps and posterior shoulder  musculature to decrease pain  and tone with good results.  Pt introduced to IFC estim to decrease pain and tone, normal responses to estim and heat noted.  Pt reported 1/10 left shoulder and neck pain.    Examination-Activity Limitations Reach Overhead;Lift;Sleep;Sit;Carry    Examination-Participation Restrictions Cleaning;Shop;Community Activity;Pincus Badder [facility]    Stability/Clinical Decision Making Stable/Uncomplicated    Rehab Potential Good    PT Frequency 2x / week    PT Duration 4 weeks    PT Treatment/Interventions ADLs/Self Care Home Management;Biofeedback;DME Instruction;Contrast Bath;Fluidtherapy;Ultrasound;Neuromuscular re-education;Parrafin;Compression bandaging;Scar mobilization;Visual/perceptual remediation/compensation;Spinal Manipulations;Passive range of motion;Patient/family education;Orthotic Fit/Training;Functional mobility training;Cryotherapy;Iontophoresis 4mg /ml Dexamethasone;Electrical Stimulation;Therapeutic activities;Manual lymph drainage;Manual techniques;Energy conservation;Dry needling;Joint Manipulations;Splinting;Taping;Vasopneumatic Device;Therapeutic exercise;Moist Heat;Traction;Balance training    PT Next Visit Plan Progress postural strength as tolerated. Manual to upper trap, scalenes, possible thoracic outlet syndrome    PT Home Exercise Plan Eval: chin tuck, scap retraction, UT stretch; 2/8: supine and standing against wall chin tuck and WBack    Consulted and Agree with Plan of Care Patient             Patient will benefit from skilled therapeutic intervention in order to improve the following deficits and impairments:  Impaired sensation, Increased fascial restricitons, Impaired UE functional use, Pain, Decreased range of motion, Impaired flexibility, Postural dysfunction, Improper body mechanics  Visit Diagnosis: Cervicalgia  Abnormal posture  Other abnormalities of gait and mobility  Radiculopathy, lumbar region     Problem List Patient Active Problem List   Diagnosis  Date Noted   Rib pain on right side 11/03/2021   Neck pain 07/05/2021   Unsteady gait when walking 06/02/2021   Numbness and tingling in both hands 06/02/2021   Thumb pain, left 04/22/2021   Flushing 10/19/2020   Depression, major, single episode, severe (HCC) 06/21/2020   Hot flashes 03/11/2020   Vitamin D deficiency 03/11/2020   Sensation of feeling hot 03/05/2020   Lumbar spine painful on movement 12/02/2019   Obesity (BMI 30.0-34.9) 06/17/2019   Sore throat 11/06/2018   History of arthroplasty of left knee 01/04/2018   OA (osteoarthritis) of knee 08/09/2017   Epigastric pain 03/02/2017   Headache 01/06/2017   Constipation 02/05/2016   Allergic rhinitis 10/08/2014   GAD (generalized anxiety disorder) 09/23/2014   Annual physical exam 06/05/2014   Back pain with radiculopathy 04/14/2014   Right shoulder pain 12/19/2011   POSTSURGICAL HYPOTHYROIDISM 06/22/2010   Normocytic anemia 08/04/2009   Gastroesophageal reflux disease 04/20/2009   Hyperlipidemia LDL goal <100 05/16/2008   Essential hypertension 05/16/2008   OSTEOPENIA 05/16/2008   Urinary incontinence 05/16/2008    05/18/2008, PTA 12/14/2021, 1:52 PM  [facility] [address] [employer], [place], [place] Phone: [phone]   Fax:  [phone]  Name: Laura [name] MRN: [name] Date of Birth: 1951/06/17

## 2021-12-14 NOTE — Discharge Instructions (Addendum)
-  Increase gabapentin to 300mg  at night. You can either take 3 of the 100mg  pills you have already, or start the 300mg  pills I sent  -Follow-up with your specialist for further management.

## 2021-12-14 NOTE — ED Triage Notes (Signed)
Pt presents with left arm pain , pt states she has seen neurologists for carpal tunnel. States entire arm feels hot , also concerned about GERD

## 2021-12-16 ENCOUNTER — Other Ambulatory Visit: Payer: Self-pay

## 2021-12-16 ENCOUNTER — Encounter: Payer: Self-pay | Admitting: Family Medicine

## 2021-12-16 ENCOUNTER — Ambulatory Visit (INDEPENDENT_AMBULATORY_CARE_PROVIDER_SITE_OTHER): Payer: Medicare Other | Admitting: Family Medicine

## 2021-12-16 VITALS — BP 125/74 | HR 76 | Ht 63.0 in | Wt 177.0 lb

## 2021-12-16 DIAGNOSIS — M4722 Other spondylosis with radiculopathy, cervical region: Secondary | ICD-10-CM | POA: Diagnosis not present

## 2021-12-16 DIAGNOSIS — I10 Essential (primary) hypertension: Secondary | ICD-10-CM

## 2021-12-16 MED ORDER — LORAZEPAM 1 MG PO TABS: ORAL_TABLET | ORAL | 0 refills | Status: DC

## 2021-12-16 NOTE — Patient Instructions (Signed)
F/U as before, call if you need me sooner  You are referred for MRI of your neck as your arm symptoms are coming from problems in your neck  Take gabapentin 100 mg one twice daily and gabapentin 300 mg one at bedtime  Medication, ativan, is prescribed for you to take for anxiety before the scan if you need it  Thanks for choosing [facility], we consider it a privelige to serve you.

## 2021-12-20 ENCOUNTER — Encounter: Payer: Self-pay | Admitting: Family Medicine

## 2021-12-20 NOTE — Assessment & Plan Note (Signed)
Disturbing upper extremity pain, gradually worsening has failed medication management, needs MRI Increase gabapentin dose

## 2021-12-20 NOTE — Assessment & Plan Note (Signed)
Controlled, no change in medication DASH diet and commitment to daily physical activity for a minimum of 30 minutes discussed and encouraged, as a part of hypertension management. The importance of attaining a healthy weight is also discussed.  BP/Weight 12/16/2021 12/14/2021 11/03/2021 10/13/2021 08/30/2021 08/02/2021 07/31/2021  Systolic BP 125 157 125 162 147 146 138  Diastolic BP 74 80 81 83 85 83 82  Wt. (Lbs) 177 - 184.04 185.4 189.08 183.04 186  BMI 31.35 - 32.6 32.84 33.49 32.42 32.95

## 2021-12-20 NOTE — Progress Notes (Signed)
° °  Laura [name]     MRN: [id number]      DOB: 09/30/1951   HPI [name] is here c/o right arm pain from wrist to shpoulder, also in the left , she experiences tingling, numbness and pain , states worse in past 3 weeks, has tingling and weakness  ROS Denies recent fever or chills. Denies sinus pressure, nasal congestion, ear pain or sore throat. Denies chest congestion, productive cough or wheezing. Denies chest pains, palpitations and leg swelling Denies abdominal pain, nausea, vomiting,diarrhea or constipation.   Denies dysuria, frequency, hesitancy or incontinence. Denies joint pain, swelling and limitation in mobility. Denies headaches, seizures, numbness, or tingling. Denies depression, anxiety or insomnia. Denies skin break down or rash.   PE  BP 125/74 (BP Location: Left Arm, Patient Position: Sitting, Cuff Size: Large)    Pulse 76    Ht 5\' 3"  (1.6 m)    Wt 177 lb (80.3 kg)    SpO2 95%    BMI 31.35 kg/m   Patient alert and oriented and in no cardiopulmonary distress.  HEENT: No facial asymmetry, EOMI,     Neck decreased ROM .  Chest: Clear to auscultation bilaterally.  CVS: S1, S2 no murmurs, no S3.Regular rate.  ABD: Soft non tender.   Ext: No edema  MS: Adequate ROM spine, shoulders, hips and knees.  Skin: Intact, no ulcerations or rash noted.  Psych: Good eye contact, normal affect. Memory intact not anxious or depressed appearing.  CNS: CN 2-12 intact, grade 4 power in upper ext.   Assessment & Plan  Radiculopathy due to cervical spondylosis at single level Disturbing upper extremity pain, gradually worsening has failed medication management, needs MRI Increase gabapentin dose  Essential hypertension Controlled, no change in medication DASH diet and commitment to daily physical activity for a minimum of 30 minutes discussed and encouraged, as a part of hypertension management. The importance of attaining a healthy weight is also  discussed.  BP/Weight 12/16/2021 12/14/2021 11/03/2021 10/13/2021 08/30/2021 08/02/2021 [id number]  Systolic BP 0000000 [id number] 0000000 0000000 [id number] 0000000  Diastolic BP 74 80 81 83 85 83 82  Wt. (Lbs) 177 - 184.04 185.4 189.08 183.04 186  BMI 31.35 - 32.6 32.84 33.49 32.42 32.95

## 2021-12-21 ENCOUNTER — Other Ambulatory Visit: Payer: Self-pay

## 2021-12-21 ENCOUNTER — Encounter (HOSPITAL_COMMUNITY): Payer: Self-pay

## 2021-12-21 ENCOUNTER — Ambulatory Visit (HOSPITAL_COMMUNITY): Payer: Medicare Other

## 2021-12-21 DIAGNOSIS — R293 Abnormal posture: Secondary | ICD-10-CM | POA: Diagnosis not present

## 2021-12-21 DIAGNOSIS — R2689 Other abnormalities of gait and mobility: Secondary | ICD-10-CM | POA: Diagnosis not present

## 2021-12-21 DIAGNOSIS — M5416 Radiculopathy, lumbar region: Secondary | ICD-10-CM | POA: Diagnosis not present

## 2021-12-21 DIAGNOSIS — M542 Cervicalgia: Secondary | ICD-10-CM | POA: Diagnosis not present

## 2021-12-21 NOTE — Therapy (Signed)
[facility] [address] [place], [place], [address] Phone: [phone]   Fax:  [phone]  Physical Therapy Treatment  Patient Details  Name: Laura [name] MRN: [id number] Date of Birth: Dec 27, 1950 Referring Provider (PT): Margarita Rana MD   Encounter Date: 12/21/2021   PT End of Session - 12/21/21 0904     Visit Number 4    Number of Visits 8    Date for PT Re-Evaluation 01/04/22    Authorization Type [employer] MEd[employer]    PT Start Time 0900    PT Stop Time 0946    PT Time Calculation (min) 46 min    Activity Tolerance Patient tolerated treatment well    Behavior During Therapy [facility] for tasks assessed/performed             Past Medical History:  Diagnosis Date   Anxiety    At low risk for fall 08/15/2018   Cystitis    Depression    Gastritis    GERD (gastroesophageal reflux disease)    Hashimoto's thyroiditis    Hx    Hyperlipidemia    Hypertension    Hypothyroidism    Normocytic anemia 2009 Hb 9.9-11.1   Osteopenia    Shoulder pain    Urinary incontinence     Past Surgical History:  Procedure Laterality Date   Bilateral tubal ligation     BIOPSY  04/21/2020   Procedure: BIOPSY;  Surgeon: Corbin Ade, MD;  [facility];  Service: Endoscopy;;   BREAST EXCISIONAL BIOPSY  2010   Excisional biopsy of benign left breast mass -lopoma    BREAST SURGERY     left nreast biopsy   COLONOSCOPY  08/26/2011   SLF: 1. Internal hemorrhoids   COLONOSCOPY N/A 10/06/2015   HUD:JSHF fissure or internal hemorrhoids/mild sigmoid colitis. benign colonic path    COLONOSCOPY WITH PROPOFOL N/A 03/15/2021   Procedure: COLONOSCOPY WITH PROPOFOL;  Surgeon: [name], DO;  [facility];  Service: Endoscopy;  Laterality: N/A;  3:00pm   colonscopy  2005   Dr. Katrinka Blazing   ESOPHAGOGASTRODUODENOSCOPY  12/2009   chronic gastritis   ESOPHAGOGASTRODUODENOSCOPY N/A 05/17/2017   Dr. Darrick Penna; gastritis, normal small bowel  biopsies, multiple gastric polyps. fundic gland polyps   ESOPHAGOGASTRODUODENOSCOPY (EGD) WITH PROPOFOL N/A 04/21/2020   Rourk: multiple gastric polyps, biopsy of one c/w fundic gland. gastric biopsy showed mild chronic gastritis but no H.pylori   GIVENS CAPSULE STUDY N/A 05/30/2017   gastritis, no source for anemia identified   left knee surgery Left March 1,2017   PARTIAL KNEE ARTHROPLASTY Left 08/09/2017   Procedure: LEFT UNICOMPARTMENTAL KNEE;  Surgeon: Ollen Gross, MD;  [facility]: [facility];  Service: Orthopedics;  Laterality: Left;   POLYPECTOMY  04/21/2020   Procedure: POLYPECTOMY;  Surgeon: Corbin Ade, MD;  [facility];  Service: Endoscopy;;  gastric   Resection of left lobe of thyroid     right carpal tunnel release     rt. knee athroscopy  2004   SAVORY DILATION N/A 05/17/2017   Procedure: SAVORY DILATION;  Surgeon: West Bali, MD;  [facility];  Service: Endoscopy;  Laterality: N/A;   TOTAL ABDOMINAL HYSTERECTOMY  1994   UMBILICAL HERNIA REPAIR     Urethral dilation for stenosis  2009    There were no vitals filed for this visit.   Subjective Assessment - 12/21/21 0903     Subjective Pt arrives for today's treatment session denying  any shoulder or neck pain.  Pt states that she still has burning in her right arm that wakes her up at night.    Patient Stated Goals "this heat to stop"    Currently in Pain? No/denies                               [facility] Adult PT Treatment/Exercise - 12/21/21 0001       Neck Exercises: Seated   Neck Retraction 15 reps   cues for technique   Cervical Rotation Both;15 reps    Lateral Flexion Both;15 reps    Shoulder Shrugs 15 reps    Shoulder Rolls Backwards;Forwards;15 reps      Shoulder Exercises: Seated   Extension Both;15 reps;Theraband    Theraband Level (Shoulder Extension) Level 2 (Red)    Row Both;15 reps;Theraband    Theraband Level (Shoulder Row) Level 2 (Red)    External  Rotation Strengthening;Left;15 reps;Theraband    Theraband Level (Shoulder External Rotation) Level 2 (Red)    Internal Rotation Strengthening;Left;15 reps;Theraband    Theraband Level (Shoulder Internal Rotation) Level 2 (Red)      Modalities   Modalities Electrical Stimulation      Electrical Stimulation   Electrical Stimulation Location Left posterior shoulder    Electrical Stimulation Action IFC    Electrical Stimulation Parameters 80-150 Hz x 15 mins    Electrical Stimulation Goals Pain;Tone      Manual Therapy   Manual Therapy Soft tissue mobilization    Soft tissue mobilization STW/M in seated to left upper trap and posterior shoulder musculature                       PT Short Term Goals - 12/07/21 1023       PT SHORT TERM GOAL #1   Title Patient will be independent with initial HEP and self-management strategies to improve functional outcomes    Time 2    Period Weeks    Status New    Target Date 12/21/21               PT Long Term Goals - 12/07/21 1023       PT LONG TERM GOAL #1   Title Patient will be independent with advanced HEP and self-management strategies to improve functional outcomes    Time 4    Period Weeks    Status New    Target Date 01/04/22      PT LONG TERM GOAL #2   Title Patient will report at least 75% overall improvement in subjective complaint to indicate improvement in ability to perform ADLs.    Time 4    Period Weeks    Status New    Target Date 01/04/22      PT LONG TERM GOAL #3   Title Patient will improve FOTO score to predicted value to indicate improvement in functional outcomes    Time 4    Period Weeks    Status New    Target Date 01/04/22                   Plan - 12/21/21 0904     Clinical Impression Statement Pt arrives for today's treatment session denying any pain, but does endorse slight left shoulder tightness.  Pt about to tolerate increased reps with all shoulder and neck exercises  today.  Pt instructed in seated shoulder tband exercises to  increase strength and function.  Pt requiring min cues for proper technique and posture, especially with IR and ER.  STW/M performed to left upper trap and paraspinals to decrease pain and tone.  Normal responses to estim noted.  Pt denied any pain and decreased tone at completion of today's treatment session.    Examination-Activity Limitations Reach Overhead;Lift;Sleep;Sit;Carry    Examination-Participation Restrictions Cleaning;Shop;Community Activity;[facility]    Stability/Clinical Decision Making Stable/Uncomplicated    Rehab Potential Good    PT Frequency 2x / week    PT Duration 4 weeks    PT Treatment/Interventions ADLs/Self Care Home Management;Biofeedback;DME Instruction;Contrast Bath;Fluidtherapy;Ultrasound;Neuromuscular re-education;Parrafin;Compression bandaging;Scar mobilization;Visual/perceptual remediation/compensation;Spinal Manipulations;Passive range of motion;Patient/family education;Orthotic Fit/Training;Functional mobility training;Cryotherapy;Iontophoresis 4mg /ml Dexamethasone;Electrical Stimulation;Therapeutic activities;Manual lymph drainage;Manual techniques;Energy conservation;Dry needling;Joint Manipulations;Splinting;Taping;Vasopneumatic Device;Therapeutic exercise;Moist Heat;Traction;Balance training    PT Next Visit Plan Progress postural strength as tolerated. Manual to upper trap, scalenes, possible thoracic outlet syndrome    PT Home Exercise Plan Eval: chin tuck, scap retraction, UT stretch; 2/8: supine and standing against wall chin tuck and WBack    Consulted and Agree with Plan of Care Patient             Patient will benefit from skilled therapeutic intervention in order to improve the following deficits and impairments:  Impaired sensation, Increased fascial restricitons, Impaired UE functional use, Pain, Decreased range of motion, Impaired flexibility, Postural dysfunction, Improper body  mechanics  Visit Diagnosis: Cervicalgia  Abnormal posture  Other abnormalities of gait and mobility     Problem List Patient Active Problem List   Diagnosis Date Noted   Radiculopathy due to cervical spondylosis at single level 12/16/2021   Rib pain on right side 11/03/2021   Neck pain, bilateral 07/05/2021   Unsteady gait when walking 06/02/2021   Numbness and tingling in both hands 06/02/2021   Thumb pain, left 04/22/2021   Flushing 10/19/2020   Depression, major, single episode, severe (HCC) 06/21/2020   Hot flashes 03/11/2020   Vitamin D deficiency 03/11/2020   Sensation of feeling hot 03/05/2020   Lumbar spine painful on movement 12/02/2019   Obesity (BMI 30.0-34.9) 06/17/2019   Sore throat 11/06/2018   History of arthroplasty of left knee 01/04/2018   OA (osteoarthritis) of knee 08/09/2017   Epigastric pain 03/02/2017   Headache 01/06/2017   Constipation 02/05/2016   Allergic rhinitis 10/08/2014   GAD (generalized anxiety disorder) 09/23/2014   Annual physical exam 06/05/2014   Back pain with radiculopathy 04/14/2014   Right shoulder pain 12/19/2011   POSTSURGICAL HYPOTHYROIDISM 06/22/2010   Normocytic anemia 08/04/2009   Gastroesophageal reflux disease 04/20/2009   Hyperlipidemia LDL goal <100 05/16/2008   Essential hypertension 05/16/2008   OSTEOPENIA 05/16/2008   Urinary incontinence 05/16/2008    05/18/2008, PTA 12/21/2021, 9:46 AM  [facility] [address] [place], [place], [place] Phone: [phone]   Fax:  [phone]  Name: Laura [name] MRN: [name] Date of Birth: 07-01-1951

## 2021-12-23 ENCOUNTER — Encounter (HOSPITAL_COMMUNITY): Payer: Medicare Other

## 2021-12-23 DIAGNOSIS — E890 Postprocedural hypothyroidism: Secondary | ICD-10-CM | POA: Diagnosis not present

## 2021-12-23 DIAGNOSIS — M5416 Radiculopathy, lumbar region: Secondary | ICD-10-CM | POA: Diagnosis not present

## 2021-12-24 LAB — TSH: TSH: 0.695 u[IU]/mL (ref 0.450–4.500)

## 2021-12-24 LAB — T4, FREE: Free T4: 1.54 ng/dL (ref 0.82–1.77)

## 2021-12-27 ENCOUNTER — Encounter (HOSPITAL_COMMUNITY): Payer: Self-pay | Admitting: Physical Therapy

## 2021-12-27 ENCOUNTER — Ambulatory Visit (HOSPITAL_COMMUNITY): Payer: Medicare Other | Admitting: Physical Therapy

## 2021-12-27 ENCOUNTER — Other Ambulatory Visit: Payer: Self-pay

## 2021-12-27 DIAGNOSIS — R2689 Other abnormalities of gait and mobility: Secondary | ICD-10-CM | POA: Diagnosis not present

## 2021-12-27 DIAGNOSIS — R293 Abnormal posture: Secondary | ICD-10-CM

## 2021-12-27 DIAGNOSIS — M542 Cervicalgia: Secondary | ICD-10-CM | POA: Diagnosis not present

## 2021-12-27 DIAGNOSIS — M5416 Radiculopathy, lumbar region: Secondary | ICD-10-CM | POA: Diagnosis not present

## 2021-12-27 NOTE — Therapy (Signed)
[place], [place], [address] Phone: [phone]   Fax:  [phone]  Physical Therapy Treatment/Discharge Summary  Patient Details  Name: Laura [name] MRN: [id number] Date of Birth: 1951-05-17 Referring Provider (PT): Edmonia Lynch MD   Encounter Date: 12/27/2021  PHYSICAL THERAPY DISCHARGE SUMMARY  Visits from Start of Care: 5  Current functional level related to goals / functional outcomes: See below   Remaining deficits: See below   Education / Equipment: See below   Patient agrees to discharge. Patient goals were met. Patient is being discharged due to meeting the stated rehab goals.    PT End of Session - 12/27/21 0918     Visit Number 5    Number of Visits 8    Date for PT Re-Evaluation 01/04/22    Authorization Type [employer] MEd[employer]    PT Start Time 3662    PT Stop Time 0947    PT Time Calculation (min) 28 min    Activity Tolerance Patient tolerated treatment well    Behavior During Therapy [facility] for tasks assessed/performed             Past Medical History:  Diagnosis Date   Anxiety    At low risk for fall 08/15/2018   Cystitis    Depression    Gastritis    GERD (gastroesophageal reflux disease)    Hashimoto's thyroiditis    Hx    Hyperlipidemia    Hypertension    Hypothyroidism    Normocytic anemia 2009 Hb 9.9-11.1   Osteopenia    Shoulder pain    Urinary incontinence     Past Surgical History:  Procedure Laterality Date   Bilateral tubal ligation     BIOPSY  04/21/2020   Procedure: BIOPSY;  Surgeon: Daneil Dolin, MD;  [facility];  Service: Endoscopy;;   BREAST EXCISIONAL BIOPSY  2010   Excisional biopsy of benign left breast mass -lopoma    BREAST SURGERY     left nreast biopsy   COLONOSCOPY  08/26/2011   SLF: 1. Internal hemorrhoids   COLONOSCOPY N/A 10/06/2015   HUT:MLYY fissure or internal hemorrhoids/mild sigmoid colitis. benign colonic path     COLONOSCOPY WITH PROPOFOL N/A 03/15/2021   Procedure: COLONOSCOPY WITH PROPOFOL;  Surgeon: Eloise Harman, DO;  [facility];  Service: Endoscopy;  Laterality: N/A;  3:00pm   colonscopy  2005   Dr. Tamala Julian   ESOPHAGOGASTRODUODENOSCOPY  12/2009   chronic gastritis   ESOPHAGOGASTRODUODENOSCOPY N/A 05/17/2017   Dr. Oneida Alar; gastritis, normal small bowel biopsies, multiple gastric polyps. fundic gland polyps   ESOPHAGOGASTRODUODENOSCOPY (EGD) WITH PROPOFOL N/A 04/21/2020   Rourk: multiple gastric polyps, biopsy of one c/w fundic gland. gastric biopsy showed mild chronic gastritis but no H.pylori   GIVENS CAPSULE STUDY N/A 05/30/2017   gastritis, no source for anemia identified   left knee surgery Left March 1,2017   PARTIAL KNEE ARTHROPLASTY Left 08/09/2017   Procedure: LEFT UNICOMPARTMENTAL KNEE;  Surgeon: Gaynelle Arabian, MD;  [facility];  Service: Orthopedics;  Laterality: Left;   POLYPECTOMY  04/21/2020   Procedure: POLYPECTOMY;  Surgeon: Daneil Dolin, MD;  [facility];  Service: Endoscopy;;  gastric   Resection of left lobe of thyroid     right carpal tunnel release     rt. knee athroscopy  2004   SAVORY DILATION N/A 05/17/2017   Procedure: SAVORY DILATION;  Surgeon: Danie Binder, MD;  [facility];  Service: Endoscopy;  Laterality: N/A;   TOTAL ABDOMINAL HYSTERECTOMY  9563   UMBILICAL HERNIA REPAIR     Urethral dilation for stenosis  2009    There were no vitals filed for this visit.   Subjective Assessment - 12/27/21 0919     Subjective Patient states exercises going well. Patient has been feeling fine without issues.    Patient Stated Goals "this heat to stop"    Currently in Pain? No/denies                [facility] PT Assessment - 12/27/21 0001       Assessment   Medical Diagnosis Neck pain    Referring Provider (PT) Edmonia Lynch MD      Precautions   Precautions None      Restrictions   Weight Bearing Restrictions No       Balance Screen   Has the patient fallen in the past 6 months Yes    How many times? 1    Has the patient had a decrease in activity level because of a fear of falling?  No    Is the patient reluctant to leave their home because of a fear of falling?  No      Cognition   Overall Cognitive Status Within Functional Limits for tasks assessed      Observation/Other Assessments   Observations ambulates without AD    Focus on Therapeutic Outcomes (FOTO)  87% function      AROM   Cervical Flexion 40    Cervical Extension 55    Cervical - Right Rotation 65    Cervical - Left Rotation 65      Palpation   Palpation comment TTP L 1st rib inferior glide - hypomobile and painful; R 1st rib WFL not tender                           [facility] Adult PT Treatment/Exercise - 12/27/21 0001       Neck Exercises: Seated   Other Seated Exercise scalenes stretch L 3x 20 second holds                     PT Education - 12/27/21 0919     Education Details HEP, reassessment findings, POC    Person(s) Educated Patient    Methods Explanation;Demonstration;Handout    Comprehension Verbalized understanding;Returned demonstration              PT Short Term Goals - 12/27/21 0927       PT SHORT TERM GOAL #1   Title Patient will be independent with initial HEP and self-management strategies to improve functional outcomes    Time 2    Period Weeks    Status Achieved    Target Date 12/21/21               PT Long Term Goals - 12/27/21 0927       PT LONG TERM GOAL #1   Title Patient will be independent with advanced HEP and self-management strategies to improve functional outcomes    Time 4    Period Weeks    Status Achieved    Target Date 01/04/22      PT LONG TERM GOAL #2   Title Patient will report at least 75% overall improvement in subjective complaint to indicate improvement in ability to perform ADLs.    Baseline 90% improvement  Time 4    Period  Weeks    Status Achieved    Target Date 01/04/22      PT LONG TERM GOAL #3   Title Patient will improve FOTO score to predicted value to indicate improvement in functional outcomes    Time 4    Period Weeks    Status Achieved    Target Date 01/04/22                   Plan - 12/27/21 0960     Clinical Impression Statement Patient has met all short and long term goals with ability to complete HEP and improvement in symptoms , activity tolerance, functional mobility, and ROM. She states intermittent symptoms with sleeping in awkward positions. Patient with tenderness upon palpation of L 1st rib with hypomobility noted and tender scalene. Patient completes scalene stretch in seated. Reviewed HEP with patient educated on reassessment findings. Patient educated on returning to PT if needed. Patient discharged from physical therapy at this time.    Examination-Activity Limitations Reach Overhead;Lift;Sleep;Sit;Carry    Examination-Participation Restrictions Cleaning;Shop;Community Activity;Dorita Sciara    Stability/Clinical Decision Making Stable/Uncomplicated    Rehab Potential Good    PT Frequency --    PT Duration --    PT Treatment/Interventions ADLs/Self Care Home Management;Biofeedback;DME Instruction;Contrast Bath;Fluidtherapy;Ultrasound;Neuromuscular re-education;Parrafin;Compression bandaging;Scar mobilization;Visual/perceptual remediation/compensation;Spinal Manipulations;Passive range of motion;Patient/family education;Orthotic Fit/Training;Functional mobility training;Cryotherapy;Iontophoresis 68m/ml Dexamethasone;Electrical Stimulation;Therapeutic activities;Manual lymph drainage;Manual techniques;Energy conservation;Dry needling;Joint Manipulations;Splinting;Taping;Vasopneumatic Device;Therapeutic exercise;Moist Heat;Traction;Balance training    PT Next Visit Plan n/a    PT Home Exercise Plan Eval: chin tuck, scap retraction, UT stretch; 2/8: supine and standing against  wall chin tuck and WBack 2/27 row, ext with bands    Consulted and Agree with Plan of Care Patient             Patient will benefit from skilled therapeutic intervention in order to improve the following deficits and impairments:  Impaired sensation, Increased fascial restricitons, Impaired UE functional use, Pain, Decreased range of motion, Impaired flexibility, Postural dysfunction, Improper body mechanics  Visit Diagnosis: Cervicalgia  Abnormal posture  Other abnormalities of gait and mobility     Problem List Patient Active Problem List   Diagnosis Date Noted   Radiculopathy due to cervical spondylosis at single level 12/16/2021   Rib pain on right side 11/03/2021   Neck pain, bilateral 07/05/2021   Unsteady gait when walking 06/02/2021   Numbness and tingling in both hands 06/02/2021   Thumb pain, left 04/22/2021   Flushing 10/19/2020   Depression, major, single episode, severe (HCC) 06/21/2020   Hot flashes 03/11/2020   Vitamin D deficiency 03/11/2020   Sensation of feeling hot 03/05/2020   Lumbar spine painful on movement 12/02/2019   Obesity (BMI 30.0-34.9) 06/17/2019   Sore throat 11/06/2018   History of arthroplasty of left knee 01/04/2018   OA (osteoarthritis) of knee 08/09/2017   Epigastric pain 03/02/2017   Headache 01/06/2017   Constipation 02/05/2016   Allergic rhinitis 10/08/2014   GAD (generalized anxiety disorder) 09/23/2014   Annual physical exam 06/05/2014   Back pain with radiculopathy 04/14/2014   Right shoulder pain 12/19/2011   POSTSURGICAL HYPOTHYROIDISM 06/22/2010   Normocytic anemia 08/04/2009   Gastroesophageal reflux disease 04/20/2009   Hyperlipidemia LDL goal <100 05/16/2008   Essential hypertension 05/16/2008   OSTEOPENIA 05/16/2008   Urinary incontinence 05/16/2008    9:57 AM, 12/27/21 AMearl LatinPT, DPT [facility]  [place], [place], [address] Phone: [phone]   Fax:  [phone]  Name: Laura [name] MRN: [id number] Date of Birth: 09/27/1951

## 2021-12-27 NOTE — Patient Instructions (Signed)
Access Code: [email or web address] URL: [email or web address] Date: 12/27/2021 Prepared by: [facility] Lajuan Kovaleski  Exercises Standing Shoulder Row with Anchored Resistance - 1 x daily - 7 x weekly - 2 sets - 15 reps Shoulder extension with resistance - Neutral - 1 x daily - 7 x weekly - 2 sets - 15 reps

## 2021-12-29 ENCOUNTER — Other Ambulatory Visit: Payer: Self-pay

## 2021-12-29 ENCOUNTER — Ambulatory Visit (HOSPITAL_COMMUNITY)
Admission: RE | Admit: 2021-12-29 | Discharge: 2021-12-29 | Disposition: A | Discharge status: Home / Self Care | Payer: Medicare Other | Source: Ambulatory Visit | Attending: Family Medicine | Admitting: Family Medicine

## 2021-12-29 ENCOUNTER — Encounter (HOSPITAL_COMMUNITY): Payer: Medicare Other | Admitting: Physical Therapy

## 2021-12-29 DIAGNOSIS — M542 Cervicalgia: Secondary | ICD-10-CM | POA: Diagnosis not present

## 2021-12-29 DIAGNOSIS — M4722 Other spondylosis with radiculopathy, cervical region: Secondary | ICD-10-CM | POA: Insufficient documentation

## 2022-01-03 ENCOUNTER — Encounter (HOSPITAL_COMMUNITY): Payer: Medicare Other | Admitting: Physical Therapy

## 2022-01-03 DIAGNOSIS — M25532 Pain in left wrist: Secondary | ICD-10-CM | POA: Diagnosis not present

## 2022-01-05 ENCOUNTER — Ambulatory Visit (INDEPENDENT_AMBULATORY_CARE_PROVIDER_SITE_OTHER): Payer: Medicare Other | Admitting: Family Medicine

## 2022-01-05 ENCOUNTER — Encounter (HOSPITAL_COMMUNITY): Payer: Medicare Other | Admitting: Physical Therapy

## 2022-01-05 ENCOUNTER — Other Ambulatory Visit: Payer: Self-pay

## 2022-01-05 ENCOUNTER — Encounter: Payer: Self-pay | Admitting: Family Medicine

## 2022-01-05 VITALS — BP 113/71 | HR 69 | Ht 65.0 in | Wt 178.0 lb

## 2022-01-05 DIAGNOSIS — R202 Paresthesia of skin: Secondary | ICD-10-CM | POA: Diagnosis not present

## 2022-01-05 DIAGNOSIS — M542 Cervicalgia: Secondary | ICD-10-CM

## 2022-01-05 DIAGNOSIS — F411 Generalized anxiety disorder: Secondary | ICD-10-CM

## 2022-01-05 DIAGNOSIS — E663 Overweight: Secondary | ICD-10-CM | POA: Diagnosis not present

## 2022-01-05 DIAGNOSIS — I10 Essential (primary) hypertension: Secondary | ICD-10-CM

## 2022-01-05 DIAGNOSIS — E785 Hyperlipidemia, unspecified: Secondary | ICD-10-CM | POA: Diagnosis not present

## 2022-01-05 DIAGNOSIS — J3089 Other allergic rhinitis: Secondary | ICD-10-CM

## 2022-01-05 DIAGNOSIS — R200 Anesthesia of skin: Secondary | ICD-10-CM

## 2022-01-05 DIAGNOSIS — E559 Vitamin D deficiency, unspecified: Secondary | ICD-10-CM | POA: Diagnosis not present

## 2022-01-05 NOTE — Progress Notes (Signed)
? ?Laura [name]     MRN: [id number]      DOB: [birth year] ? ? ?HPI ?Laura [name] is here for follow up and re-evaluation of chronic medical conditions, medication management and review of any available recent lab and radiology data.  ?Preventive health is updated, specifically  Cancer screening and Immunization.   ?Questions or concerns regarding consultations or procedures which the PT has had in the interim are  addressed. ?The PT denies any adverse reactions to current medications since the last visit.  ?There are no new concerns.  ?There are no specific complaints  ? ?ROS ?Denies recent fever or chills. ?Denies sinus pressure, nasal congestion, ear pain or sore throat. ?Denies chest congestion, productive cough or wheezing. ?Denies chest pains, palpitations and leg swelling ?Denies abdominal pain, nausea, vomiting,diarrhea or constipation.   ?Denies dysuria, frequency, hesitancy or incontinence. ?Denies uncontrolled joint pain, swelling and limitation in mobility. ?Denies headaches, seizures, numbness, or tingling. ?Denies depression, anxiety or insomnia. ?Denies skin break down or rash. ? ? ?PE ? ?BP 113/71   Pulse 69   Ht 5\' 5"  (1.651 m)   Wt 178 lb (80.7 kg)   SpO2 94%   BMI 29.62 kg/m?  ? ?Patient alert and oriented and in no cardiopulmonary distress. ? ?HEENT: No facial asymmetry, EOMI,     Neck supple . ? ?Chest: Clear to auscultation bilaterally. ? ?CVS: S1, S2 no murmurs, no S3.Regular rate. ? ?ABD: Soft non tender.  ? ?Ext: No edema ? ?MS: Adequate ROM spine, shoulders, hips and knees. ? ?Skin: Intact, no ulcerations or rash noted. ? ?Psych: Good eye contact, normal affect. Memory intact not anxious or depressed appearing. ? ?CNS: CN 2-12 intact, power,  normal throughout.no focal deficits noted. ? ? ?Assessment & Plan ? ?Neck pain, bilateral ?Review of scan of C spine shows no significant pathology which is reassuring, currently wearing carpal tunnel brace from ortho and states this is helping  alot ? ?Hyperlipidemia LDL goal <100 ?Hyperlipidemia:Low fat diet discussed and encouraged. ? ? ?Lipid Panel  ?Lab Results  ?Component Value Date  ? CHOL 157 01/05/2022  ? HDL 49 01/05/2022  ? LDLCALC 84 01/05/2022  ? TRIG 140 01/05/2022  ? CHOLHDL 3.2 01/05/2022  ? ? ? ?Controlled, no change in medication ? ? ?Numbness and tingling in both hands ?Carpal tunnel brace effective per pt report ? ?Overweight (BMI 25.0-29.9) ? ?Patient re-educated about  the importance of commitment to a  minimum of 150 minutes of exercise per week as able. ? ?The importance of healthy food choices with portion control discussed, as well as eating regularly and within a 12 hour window most days. ?The need to choose "clean , green" food 50 to 75% of the time is discussed, as well as to make water the primary drink and set a goal of 64 ounces water daily. ? ?  ?Weight /BMI 01/05/2022 12/16/2021 11/03/2021  ?WEIGHT 178 lb 177 lb 184 lb 0.6 oz  ?HEIGHT 5\' 5"  5\' 3"  5\' 3"   ?BMI 29.62 kg/m2 31.35 kg/m2 32.6 kg/m2  ? ? ? ? ?Essential hypertension ?Controlled, no change in medication ?DASH diet and commitment to daily physical activity for a minimum of 30 minutes discussed and encouraged, as a part of hypertension management. ?The importance of attaining a healthy weight is also discussed. ? ?BP/Weight 01/05/2022 12/16/2021 12/14/2021 11/03/2021 10/13/2021 08/30/2021 08/02/2021  ?Systolic BP 113 125 157 125 162 147 146  ?Diastolic BP 71 74 80 81 83 85 83  ?Wt. (  Lbs) 178 177 - 184.04 185.4 189.08 183.04  ?BMI 29.62 31.35 - 32.6 32.84 33.49 32.42  ? ? ? ? ? ?GAD (generalized anxiety disorder) ?Controlled, no change in medication ? ? ?Allergic rhinitis ?Controlled, no change in medication ? ? ?

## 2022-01-05 NOTE — Patient Instructions (Addendum)
Follow-up in early September call if you need me sooner.  Flu vaccine at that visit.  We will call back for the second shingles shot once we get this sorted out. ? ?Thankful that the scan of your neck shows no significant arthritis or disc problems. ? ?Used Tylenol arthritis 2 tablets every day for arthritic pain and you may use topical rubs like Voltaren and Aspercreme for flares. ? ?Excellent blood pressure. ? ?Fasting labs ordered in January are past due please get them this morning if you have had  nothing to eat.  If you have then get them as soon as possible thank you ? ?It is important that you exercise regularly at least 30 minutes 5 times a week. If you develop chest pain, have severe difficulty breathing, or feel very tired, stop exercising immediately and seek medical attention  ?Thanks for choosing [facility], we consider it a privelige to serve you. ? ?

## 2022-01-06 LAB — CMP14+EGFR
ALT: 17 IU/L (ref 0–32)
AST: 20 IU/L (ref 0–40)
Albumin/Globulin Ratio: 1.8 (ref 1.2–2.2)
Albumin: 4.2 g/dL (ref 3.7–4.7)
Alkaline Phosphatase: 83 IU/L (ref 44–121)
BUN/Creatinine Ratio: 10 — ABNORMAL LOW (ref 12–28)
BUN: 10 mg/dL (ref 8–27)
Bilirubin Total: 0.4 mg/dL (ref 0.0–1.2)
CO2: 30 mmol/L — ABNORMAL HIGH (ref 20–29)
Calcium: 9.9 mg/dL (ref 8.7–10.3)
Chloride: 99 mmol/L (ref 96–106)
Creatinine, Ser: 1.02 mg/dL — ABNORMAL HIGH (ref 0.57–1.00)
Globulin, Total: 2.3 g/dL (ref 1.5–4.5)
Glucose: 90 mg/dL (ref 70–99)
Potassium: 3.4 mmol/L — ABNORMAL LOW (ref 3.5–5.2)
Sodium: 140 mmol/L (ref 134–144)
Total Protein: 6.5 g/dL (ref 6.0–8.5)
eGFR: 59 mL/min/{1.73_m2} — ABNORMAL LOW (ref >59)

## 2022-01-06 LAB — LIPID PANEL
Chol/HDL Ratio: 3.2 ratio (ref 0.0–4.4)
Cholesterol, Total: 157 mg/dL (ref 100–199)
HDL: 49 mg/dL (ref >39)
LDL Chol Calc (NIH): 84 mg/dL (ref 0–99)
Triglycerides: 140 mg/dL (ref 0–149)
VLDL Cholesterol Cal: 24 mg/dL (ref 5–40)

## 2022-01-06 LAB — CBC
Hematocrit: 34 % (ref 34.0–46.6)
Hemoglobin: 11 g/dL — ABNORMAL LOW (ref 11.1–15.9)
MCH: 28.6 pg (ref 26.6–33.0)
MCHC: 32.4 g/dL (ref 31.5–35.7)
MCV: 88 fL (ref 79–97)
Platelets: 291 10*3/uL (ref 150–450)
RBC: 3.85 x10E6/uL (ref 3.77–5.28)
RDW: 11.8 % (ref 11.7–15.4)
WBC: 5.4 10*3/uL (ref 3.4–10.8)

## 2022-01-06 LAB — VITAMIN D 25 HYDROXY (VIT D DEFICIENCY, FRACTURES): Vit D, 25-Hydroxy: 55.6 ng/mL (ref 30.0–100.0)

## 2022-01-10 ENCOUNTER — Encounter (HOSPITAL_COMMUNITY): Payer: Medicare Other | Admitting: Physical Therapy

## 2022-01-10 NOTE — Assessment & Plan Note (Signed)
?  Patient re-educated about  the importance of commitment to a  minimum of 150 minutes of exercise per week as able. ? ?The importance of healthy food choices with portion control discussed, as well as eating regularly and within a 12 hour window most days. ?The need to choose "clean , green" food 50 to 75% of the time is discussed, as well as to make water the primary drink and set a goal of 64 ounces water daily. ? ?  ?Weight /BMI 01/05/2022 12/16/2021 11/03/2021  ?WEIGHT 178 lb 177 lb 184 lb 0.6 oz  ?HEIGHT 5\' 5"  5\' 3"  5\' 3"   ?BMI 29.62 kg/m2 31.35 kg/m2 32.6 kg/m2  ? ? ? ?

## 2022-01-10 NOTE — Assessment & Plan Note (Signed)
Controlled, no change in medication  

## 2022-01-10 NOTE — Assessment & Plan Note (Signed)
Carpal tunnel brace effective per pt report ?

## 2022-01-10 NOTE — Assessment & Plan Note (Signed)
Hyperlipidemia:Low fat diet discussed and encouraged.   Lipid Panel  Lab Results  Component Value Date   CHOL 157 01/05/2022   HDL 49 01/05/2022   LDLCALC 84 01/05/2022   TRIG 140 01/05/2022   CHOLHDL 3.2 01/05/2022     Controlled, no change in medication  

## 2022-01-10 NOTE — Assessment & Plan Note (Addendum)
Review of scan of C spine shows no significant pathology which is reassuring, currently wearing carpal tunnel brace from ortho and states this is helping alot ?

## 2022-01-10 NOTE — Assessment & Plan Note (Signed)
Controlled, no change in medication ?DASH diet and commitment to daily physical activity for a minimum of 30 minutes discussed and encouraged, as a part of hypertension management. ?The importance of attaining a healthy weight is also discussed. ? ?BP/Weight 01/05/2022 12/16/2021 12/14/2021 11/03/2021 10/13/2021 08/30/2021 08/02/2021  ?Systolic BP 113 125 157 125 162 147 146  ?Diastolic BP 71 74 80 81 83 85 83  ?Wt. (Lbs) 178 177 - 184.04 185.4 189.08 183.04  ?BMI 29.62 31.35 - 32.6 32.84 33.49 32.42  ? ? ? ? ?

## 2022-01-12 ENCOUNTER — Ambulatory Visit: Payer: Medicare Other | Admitting: "Endocrinology

## 2022-01-12 ENCOUNTER — Other Ambulatory Visit: Payer: Self-pay

## 2022-01-12 ENCOUNTER — Encounter (HOSPITAL_COMMUNITY): Payer: Medicare Other | Admitting: Physical Therapy

## 2022-01-12 ENCOUNTER — Encounter: Payer: Self-pay | Admitting: "Endocrinology

## 2022-01-12 VITALS — BP 120/76 | HR 64 | Ht 65.0 in | Wt 178.2 lb

## 2022-01-12 DIAGNOSIS — E890 Postprocedural hypothyroidism: Secondary | ICD-10-CM

## 2022-01-12 DIAGNOSIS — R7303 Prediabetes: Secondary | ICD-10-CM | POA: Diagnosis not present

## 2022-01-12 NOTE — Progress Notes (Signed)
01/12/2022 ? ?Endocrinology follow-up note ? ?Complaint: Follow-up for hypothyroidism ? ?HPI  ?Laura [name] is a 71 y.o.-year-old female,  ?She has  medical history of goiter status post left hemithyroidectomy 20+ years ago, unremarkable thyroid/neck ultrasound in 2012.  She is currently on levothyroxine 88 mcg p.o. daily before breakfast.  She is compliant to her medications.  She has steady weight since last visit.  ? ?-  She denies any heat/cold intolerance, palpitations, tremors.   ? ?Pt denies feeling nodules in neck, hoarseness, dysphagia/odynophagia, SOB with lying down. ? ?No history of radiation exposure to her head and neck.   ?No recent use of iodine supplements. ? ? ?ROS: ?Constitutional: + steady weight, no fatigue, no subjective hyperthermia nor hypothermia.   ? ? ?PE: ?BP 120/76   Pulse 64   Ht 5' 5"  (1.651 m)   Wt 178 lb 3.2 oz (80.8 kg)   BMI 29.65 kg/m?  ?Wt Readings from Last 3 Encounters:  ?01/12/22 178 lb 3.2 oz (80.8 kg)  ?01/05/22 178 lb (80.7 kg)  ?12/16/21 177 lb (80.3 kg)  ? ? ?Physical Exam- Limited ? ?Constitutional:  Body mass index is 29.65 kg/m?. , not in acute distress, normal state of mind ? ? ? ?Recent Results (from the past 2160 hour(s))  ?TSH     Status: None  ? Collection Time: 12/23/21 11:00 AM  ?Result Value Ref Range  ? TSH 0.695 0.450 - 4.500 uIU/mL  ?T4, free     Status: None  ? Collection Time: 12/23/21 11:00 AM  ?Result Value Ref Range  ? Free T4 1.54 0.82 - 1.77 ng/dL  ?Lipid panel     Status: None  ? Collection Time: 01/05/22  8:45 AM  ?Result Value Ref Range  ? Cholesterol, Total 157 100 - 199 mg/dL  ? Triglycerides 140 0 - 149 mg/dL  ? HDL 49 >39 mg/dL  ? VLDL Cholesterol Cal 24 5 - 40 mg/dL  ? LDL Chol Calc (NIH) 84 0 - 99 mg/dL  ? Chol/HDL Ratio 3.2 0.0 - 4.4 ratio  ?  Comment:                                   T. Chol/HDL Ratio ?                                            Men  Women ?                              1/2 Avg.Risk  3.4    3.3 ?                                   Avg.Risk  5.0    4.4 ?                               2X Avg.Risk  9.6    7.1 ?                               3X Avg.Risk 23.4   11.0 ?  ?CMP14+EGFR  Status: Abnormal  ? Collection Time: 01/05/22  8:45 AM  ?Result Value Ref Range  ? Glucose 90 70 - 99 mg/dL  ? BUN 10 8 - 27 mg/dL  ? Creatinine, Ser 1.02 (H) 0.57 - 1.00 mg/dL  ? eGFR 59 (L) >59 mL/min/1.73  ? BUN/Creatinine Ratio 10 (L) 12 - 28  ? Sodium 140 134 - 144 mmol/L  ? Potassium 3.4 (L) 3.5 - 5.2 mmol/L  ? Chloride 99 96 - 106 mmol/L  ? CO2 30 (H) 20 - 29 mmol/L  ? Calcium 9.9 8.7 - 10.3 mg/dL  ? Total Protein 6.5 6.0 - 8.5 g/dL  ? Albumin 4.2 3.7 - 4.7 g/dL  ? Globulin, Total 2.3 1.5 - 4.5 g/dL  ? Albumin/Globulin Ratio 1.8 1.2 - 2.2  ? Bilirubin Total 0.4 0.0 - 1.2 mg/dL  ? Alkaline Phosphatase 83 44 - 121 IU/L  ? AST 20 0 - 40 IU/L  ? ALT 17 0 - 32 IU/L  ?VITAMIN D 25 Hydroxy (Vit-D Deficiency, Fractures)     Status: None  ? Collection Time: 01/05/22  8:45 AM  ?Result Value Ref Range  ? Vit D, 25-Hydroxy 55.6 30.0 - 100.0 ng/mL  ?  Comment: Vitamin D deficiency has been defined by the Institute of ?Medicine and an Endocrine Society practice guideline as a ?level of serum 25-OH vitamin D less than 20 ng/mL (1,2). ?The Endocrine Society went on to further define vitamin D ?insufficiency as a level between 21 and 29 ng/mL (2). ?1. IOM (Institute of Medicine). 2010. Dietary reference ?   intakes for calcium and D. [place]: The ?   [employer]. ?2. Holick MF, [place], Bischoff-Ferrari HA, et al. ?   Evaluation, treatment, and prevention of vitamin D ?   deficiency: an Endocrine Society clinical practice ?   guideline. JCEM. 2011 Jul; 96(7):1911-30. ?  ?CBC     Status: Abnormal  ? Collection Time: 01/05/22  8:45 AM  ?Result Value Ref Range  ? WBC 5.4 3.4 - 10.8 x10E3/uL  ? RBC 3.85 3.77 - 5.28 x10E6/uL  ? Hemoglobin 11.0 (L) 11.1 - 15.9 g/dL  ? Hematocrit 34.0 34.0 - 46.6 %  ? MCV 88 79 - 97 fL  ? MCH 28.6 26.6 - 33.0 pg  ?  MCHC 32.4 31.5 - 35.7 g/dL  ? RDW 11.8 11.7 - 15.4 %  ? Platelets 291 150 - 450 x10E3/uL  ? ?ASSESSMENT: ?1. Hypothyroidism, postsurgical ? ?PLAN:  ?-Her previsit thyroid function tests are consistent with appropriate replacement.  I discussed and continued her levothyroxine at  88 mcg p.o. daily before breakfast. ? ? - We discussed about the correct intake of her thyroid hormone, on  - We discussed about the correct intake of her thyroid hormone, on empty stomach at fasting, with water, separated by at least 30 minutes from breakfast and other medications,  and separated by more than 4 hours from calcium, iron, multivitamins, acid reflux medications (PPIs). ?-Patient is made aware of the fact that thyroid hormone replacement is needed for life, dose to be adjusted by periodic monitoring of thyroid function tests. ? ? ?-Her surveillance thyroid/neck ultrasound was unremarkable in 2012. ? ?Her recent POC  A1c is 5.4%, improving from prior measurements consistent with prediabetes. ? ?- she acknowledges that there is a room for improvement in her food and drink choices. ?- Suggestion is made for her to avoid simple carbohydrates  from her diet including Cakes, Sweet Desserts, Ice Cream, Soda (diet and  regular), Sweet Tea, Candies, Chips, Cookies, Store Bought Juices, Alcohol in Excess of  1-2 drinks a day, Artificial Sweeteners,  Coffee Creamer, and "Sugar-free" Products, Lemonade. This will help patient to have more stable blood glucose profile and potentially avoid unintended weight gain. ?-Her screening bone density from October 2019 was normal.   ? ?She is advised to maintain close follow up with her PMD for Primary care needs. ? ? ? ?I spent 20 minutes in the care of the patient today including review of labs from Thyroid Function, CMP, and other relevant labs ; imaging/biopsy records (current and previous including abstractions from other facilities); face-to-face time discussing  her lab results and symptoms,  medications doses, her options of short and long term treatment based on the latest standards of care / guidelines;   and documenting the encounter. ? ?Rolley Sims  participated in the discussions, expressed understanding, and voiced agreement with the above plans.  All questions were answered to her satisfaction. she is encouraged to contact clinic should she have any questions or concerns prior to her return visit. ? ?Glade Lloyd, MD ?Phone: [phone]  Fax: [phone]  ? ?01/12/2022, 5:06 PM  ? ?

## 2022-01-17 DIAGNOSIS — M25562 Pain in left knee: Secondary | ICD-10-CM | POA: Diagnosis not present

## 2022-01-17 DIAGNOSIS — M25561 Pain in right knee: Secondary | ICD-10-CM | POA: Diagnosis not present

## 2022-01-20 ENCOUNTER — Encounter: Payer: Self-pay | Admitting: Family Medicine

## 2022-01-21 ENCOUNTER — Other Ambulatory Visit: Payer: Self-pay | Admitting: Gastroenterology

## 2022-01-21 NOTE — Telephone Encounter (Signed)
Last ov 10/13/21 

## 2022-01-27 DIAGNOSIS — G5602 Carpal tunnel syndrome, left upper limb: Secondary | ICD-10-CM | POA: Diagnosis not present

## 2022-01-29 ENCOUNTER — Other Ambulatory Visit: Payer: Self-pay | Admitting: "Endocrinology

## 2022-01-29 DIAGNOSIS — E890 Postprocedural hypothyroidism: Secondary | ICD-10-CM

## 2022-02-07 DIAGNOSIS — G588 Other specified mononeuropathies: Secondary | ICD-10-CM | POA: Diagnosis not present

## 2022-02-07 DIAGNOSIS — M5440 Lumbago with sciatica, unspecified side: Secondary | ICD-10-CM | POA: Diagnosis not present

## 2022-02-07 DIAGNOSIS — M5416 Radiculopathy, lumbar region: Secondary | ICD-10-CM | POA: Diagnosis not present

## 2022-02-09 DIAGNOSIS — G5602 Carpal tunnel syndrome, left upper limb: Secondary | ICD-10-CM | POA: Diagnosis not present

## 2022-02-09 DIAGNOSIS — M25561 Pain in right knee: Secondary | ICD-10-CM | POA: Diagnosis not present

## 2022-02-23 DIAGNOSIS — G5602 Carpal tunnel syndrome, left upper limb: Secondary | ICD-10-CM | POA: Diagnosis not present

## 2022-03-07 DIAGNOSIS — H04123 Dry eye syndrome of bilateral lacrimal glands: Secondary | ICD-10-CM | POA: Diagnosis not present

## 2022-03-10 ENCOUNTER — Other Ambulatory Visit: Payer: Self-pay | Admitting: Family Medicine

## 2022-03-24 DIAGNOSIS — M5416 Radiculopathy, lumbar region: Secondary | ICD-10-CM | POA: Diagnosis not present

## 2022-04-11 ENCOUNTER — Encounter: Payer: Self-pay | Admitting: Family Medicine

## 2022-04-11 ENCOUNTER — Ambulatory Visit
Admission: EM | Admit: 2022-04-11 | Discharge: 2022-04-11 | Disposition: A | Discharge status: Home / Self Care | Payer: Medicare Other | Attending: Family Medicine | Admitting: Family Medicine

## 2022-04-11 DIAGNOSIS — R82998 Other abnormal findings in urine: Secondary | ICD-10-CM

## 2022-04-11 DIAGNOSIS — R1084 Generalized abdominal pain: Secondary | ICD-10-CM

## 2022-04-11 DIAGNOSIS — K219 Gastro-esophageal reflux disease without esophagitis: Secondary | ICD-10-CM

## 2022-04-11 LAB — POCT URINALYSIS DIP (MANUAL ENTRY)
Bilirubin, UA: NEGATIVE
Blood, UA: NEGATIVE
Glucose, UA: NEGATIVE mg/dL
Nitrite, UA: NEGATIVE
Protein Ur, POC: NEGATIVE mg/dL
Spec Grav, UA: 1.02 (ref 1.010–1.025)
Urobilinogen, UA: 1 E.U./dL
pH, UA: 7.5 (ref 5.0–8.0)

## 2022-04-11 MED ORDER — SUCRALFATE 1 G PO TABS: 1.0000 g | ORAL_TABLET | Freq: Three times a day (TID) | ORAL | 0 refills | Status: DC | PRN

## 2022-04-11 MED ORDER — ALUM & MAG HYDROXIDE-SIMETH 200-200-20 MG/5ML PO SUSP
30.0000 mL | Freq: Once | ORAL | Status: AC
  Administered 2022-04-11: 30 mL via ORAL

## 2022-04-11 MED ORDER — PANTOPRAZOLE SODIUM 40 MG PO TBEC: 40.0000 mg | DELAYED_RELEASE_TABLET | Freq: Every day | ORAL | 0 refills | Status: DC

## 2022-04-11 MED ORDER — LIDOCAINE VISCOUS HCL 2 % MT SOLN
15.0000 mL | Freq: Once | OROMUCOSAL | Status: AC
  Administered 2022-04-11: 15 mL via ORAL

## 2022-04-11 NOTE — ED Triage Notes (Signed)
Pt presents with c/o burning sensation in abdomen that began yesterday but also states her arms feel hot as well

## 2022-04-11 NOTE — ED Provider Notes (Signed)
[facility]    CSN: [id number] Arrival date & time: 04/11/22  0913      History   Chief Complaint Chief Complaint  Patient presents with   Abdominal Pain    HPI Laura [name] is a 71 y.o. female.   Presenting today with epigastric burning sensation that now feels like it is radiating to other areas of her abdomen since yesterday.  States the pain is fairly constant and not associated with any belching, vomiting, diarrhea, constipation, fever, chills, body aches.  Has had normal bowel movements this past week including yesterday.  Denies any dysuria, hematuria, back pain and has been eating a normal diet.  Does have a history of GERD on omeprazole over-the-counter.  States that it feels like it may be related to her GERD.  No new medications or diet changes recently.    Past Medical History:  Diagnosis Date   Anxiety    At low risk for fall 08/15/2018   Cystitis    Depression    Gastritis    GERD (gastroesophageal reflux disease)    Hashimoto's thyroiditis    Hx    Hyperlipidemia    Hypertension    Hypothyroidism    Normocytic anemia 2009 Hb 9.9-11.1   Osteopenia    Shoulder pain    Urinary incontinence     Patient Active Problem List   Diagnosis Date Noted   Prediabetes 01/12/2022   Radiculopathy due to cervical spondylosis at single level 12/16/2021   Rib pain on right side 11/03/2021   Neck pain, bilateral 07/05/2021   Unsteady gait when walking 06/02/2021   Numbness and tingling in both hands 06/02/2021   Thumb pain, left 04/22/2021   Flushing 10/19/2020   Depression, major, single episode, severe (HCC) 06/21/2020   Hot flashes 03/11/2020   Vitamin D deficiency 03/11/2020   Sensation of feeling hot 03/05/2020   Lumbar spine painful on movement 12/02/2019   Overweight (BMI 25.0-29.9) 06/17/2019   Sore throat 11/06/2018   History of arthroplasty of left knee 01/04/2018   OA (osteoarthritis) of knee 08/09/2017   Epigastric pain 03/02/2017    Headache 01/06/2017   Constipation 02/05/2016   Allergic rhinitis 10/08/2014   GAD (generalized anxiety disorder) 09/23/2014   Back pain with radiculopathy 04/14/2014   Right shoulder pain 12/19/2011   POSTSURGICAL HYPOTHYROIDISM 06/22/2010   Normocytic anemia 08/04/2009   Gastroesophageal reflux disease 04/20/2009   Hyperlipidemia LDL goal <100 05/16/2008   Essential hypertension 05/16/2008   OSTEOPENIA 05/16/2008   Urinary incontinence 05/16/2008    Past Surgical History:  Procedure Laterality Date   Bilateral tubal ligation     BIOPSY  04/21/2020   Procedure: BIOPSY;  Surgeon: Corbin Ade, MD;  Location: [facility];  Service: Endoscopy;;   BREAST EXCISIONAL BIOPSY  2010   Excisional biopsy of benign left breast mass -lopoma    BREAST SURGERY     left nreast biopsy   COLONOSCOPY  08/26/2011   SLF: 1. Internal hemorrhoids   COLONOSCOPY N/A 10/06/2015   [id number] fissure or internal hemorrhoids/mild sigmoid colitis. benign colonic path    COLONOSCOPY WITH PROPOFOL N/A 03/15/2021   Procedure: COLONOSCOPY WITH PROPOFOL;  Surgeon: [name], DO;  Location: [facility];  Service: Endoscopy;  Laterality: N/A;  3:00pm   colonscopy  2005   Dr. Katrinka Blazing   ESOPHAGOGASTRODUODENOSCOPY  12/2009   chronic gastritis   ESOPHAGOGASTRODUODENOSCOPY N/A 05/17/2017   Dr. Darrick Penna; gastritis, normal small bowel biopsies, multiple gastric polyps. fundic gland  polyps   ESOPHAGOGASTRODUODENOSCOPY (EGD) WITH PROPOFOL N/A 04/21/2020   Rourk: multiple gastric polyps, biopsy of one c/w fundic gland. gastric biopsy showed mild chronic gastritis but no H.pylori   GIVENS CAPSULE STUDY N/A 05/30/2017   gastritis, no source for anemia identified   left knee surgery Left March 1,2017   PARTIAL KNEE ARTHROPLASTY Left 08/09/2017   Procedure: LEFT UNICOMPARTMENTAL KNEE;  Surgeon: Ollen Gross, MD;  Location: [facility];  Service: Orthopedics;  Laterality: Left;   POLYPECTOMY  04/21/2020   Procedure:  POLYPECTOMY;  Surgeon: Corbin Ade, MD;  Location: [facility];  Service: Endoscopy;;  gastric   Resection of left lobe of thyroid     right carpal tunnel release     rt. knee athroscopy  2004   SAVORY DILATION N/A 05/17/2017   Procedure: SAVORY DILATION;  Surgeon: West Bali, MD;  Location: [facility];  Service: Endoscopy;  Laterality: N/A;   TOTAL ABDOMINAL HYSTERECTOMY  1994   UMBILICAL HERNIA REPAIR     Urethral dilation for stenosis  2009   OB History     Gravida  3   Para  3   Term  3   Preterm      AB      Living  3      SAB      IAB      Ectopic      Multiple      Live Births  3          Home Medications    Prior to Admission medications   Medication Sig Start Date End Date Taking? Authorizing Provider  pantoprazole (PROTONIX) 40 MG tablet Take 1 tablet (40 mg total) by mouth daily. 04/11/22  Yes Particia Nearing, PA-C  sucralfate (CARAFATE) 1 g tablet Take 1 tablet (1 g total) by mouth 3 (three) times daily as needed. May dissolve 1 tablet into a glass of water and drink as needed 04/11/22  Yes Particia Nearing, PA-C  acetaminophen (TYLENOL) 500 MG tablet Take 1 tablet (500 mg total) by mouth every 6 (six) hours as needed. Patient taking differently: Take 1,500 mg by mouth daily as needed for mild pain or headache. 11/15/19   Avegno, Zachery Dakins, FNP  amLODipine (NORVASC) 2.5 MG tablet TAKE 1 TABLET BY MOUTH  DAILY 10/07/21   Donell Beers, FNP  atorvastatin (LIPITOR) 80 MG tablet TAKE 1 TABLET BY MOUTH  DAILY 11/25/21   Kerri Perches, MD  citalopram (CELEXA) 40 MG tablet TAKE 1 TABLET BY MOUTH  DAILY 06/04/21   Kerri Perches, MD  gabapentin (NEURONTIN) 100 MG capsule TAKE 1 CAPSULE BY MOUTH AT  BEDTIME 07/16/21   Kerri Perches, MD  hydrochlorothiazide (HYDRODIURIL) 25 MG tablet TAKE 1 TABLET BY MOUTH  DAILY 07/16/21   Kerri Perches, MD  hydrocortisone (ANUSOL-HC) 2.5 % rectal cream Place 1 application rectally  2 (two) times daily. 10/13/21   Gelene Mink, NP  Lactobacillus Rhamnosus, GG, (CULTURELLE PO) Take by mouth daily.    [provider]  levothyroxine (SYNTHROID) 88 MCG tablet TAKE 1 TABLET(88 MCG) BY MOUTH DAILY BEFORE BREAKFAST 01/31/22   Roma Kayser, MD  lisinopril (ZESTRIL) 40 MG tablet Take 40 mg by mouth daily.    [provider]  montelukast (SINGULAIR) 10 MG tablet TAKE 1 TABLET BY MOUTH AT  BEDTIME 08/17/21   Kerri Perches, MD  Multiple Vitamin (MULTIVITAMIN WITH MINERALS) TABS tablet Take 1 tablet by  mouth daily.    [provider]  omeprazole (PRILOSEC) 20 MG capsule TAKE 1 CAPSULE(20 MG) BY MOUTH TWICE DAILY BEFORE A MEAL 01/21/22   Gelene MinkBoone, Anna W, NP  Polyethyl Glycol-Propyl Glycol (SYSTANE OP) Place 1 drop into both eyes daily as needed (dry eyes).    [provider]  Potassium 99 MG TABS Take 99 mg by mouth 2 (two) times daily.    [provider]  traMADol (ULTRAM) 50 MG tablet Take 50 mg by mouth every 8 (eight) hours as needed for moderate pain.    [provider]   Family History Family History  Problem Relation Age of Onset   Pneumonia Mother    Healthy Father    Colon cancer Neg Hx    Anesthesia problems Neg Hx    Hypotension Neg Hx    Malignant hyperthermia Neg Hx    Pseudochol deficiency Neg Hx    Social History Social History   Tobacco Use   Smoking status: Former    Packs/day: 0.25    Years: 1.00    Total pack years: 0.25    Types: Cigarettes    Quit date: 08/25/1972    Years since quitting: 49.6   Smokeless tobacco: Never  Vaping Use   Vaping Use: Never used  Substance Use Topics   Alcohol use: No   Drug use: No    Allergies   Cozaar [losartan potassium], Dilaudid [hydromorphone hcl], Levaquin [levofloxacin], Nsaids, and Promethazine   Review of Systems Review of Systems Per HPI  Physical Exam Triage Vital Signs ED Triage Vitals  Enc Vitals Group     BP 04/11/22 0936 130/81      Pulse Rate 04/11/22 0936 71     Resp 04/11/22 0936 20     Temp 04/11/22 0936 99.1 F (37.3 C)     Temp src --      SpO2 04/11/22 0936 95 %     Weight --      Height --      Head Circumference --      Peak Flow --      Pain Score 04/11/22 0934 8     Pain Loc --      Pain Edu? --      Excl. in GC? --    No data found.  Updated Vital Signs BP 130/81   Pulse 71   Temp 99.1 F (37.3 C)   Resp 20   SpO2 95%   Visual Acuity Right Eye Distance:   Left Eye Distance:   Bilateral Distance:    Right Eye Near:   Left Eye Near:    Bilateral Near:     Physical Exam Vitals and nursing note reviewed.  Constitutional:      Appearance: Normal appearance. She is not ill-appearing.  HENT:     Head: Atraumatic.     Mouth/Throat:     Mouth: Mucous membranes are moist.  Eyes:     Extraocular Movements: Extraocular movements intact.     Conjunctiva/sclera: Conjunctivae normal.  Cardiovascular:     Rate and Rhythm: Normal rate and regular rhythm.     Heart sounds: Normal heart sounds.  Pulmonary:     Effort: Pulmonary effort is normal.     Breath sounds: Normal breath sounds.  Abdominal:     General: Bowel sounds are normal. There is no distension.     Palpations: Abdomen is soft.     Tenderness: There is abdominal tenderness. There is no right CVA  tenderness, left CVA tenderness or guarding.     Comments: Mild epigastric tenderness to palpation without distention or guarding  Musculoskeletal:        General: Normal range of motion.     Cervical back: Normal range of motion and neck supple.  Skin:    General: Skin is warm and dry.  Neurological:     Mental Status: She is alert and oriented to person, place, and time.  Psychiatric:        Mood and Affect: Mood normal.        Thought Content: Thought content normal.        Judgment: Judgment normal.    UC Treatments / Results  Labs (all labs ordered are listed, but only abnormal results are displayed) Labs Reviewed   POCT URINALYSIS DIP (MANUAL ENTRY) - Abnormal; Notable for the following components:      Result Value   Ketones, POC UA small (15) (*)    Leukocytes, UA Large (3+) (*)    All other components within normal limits  URINE CULTURE  CBC WITH DIFFERENTIAL/PLATELET  COMPREHENSIVE METABOLIC PANEL  LIPASE    EKG   Radiology No results found.  Procedures Procedures (including critical care time)  Medications Ordered in UC Medications  alum & mag hydroxide-simeth (MAALOX/MYLANTA) 200-200-20 MG/5ML suspension 30 mL (30 mLs Oral Given 04/11/22 1016)    And  lidocaine (XYLOCAINE) 2 % viscous mouth solution 15 mL (15 mLs Oral Given 04/11/22 1016)    Initial Impression / Assessment and Plan / UC Course  I have reviewed the triage vital signs and the nursing notes.  Pertinent labs & imaging results that were available during my care of the patient were reviewed by me and considered in my medical decision making (see chart for details).     Pain appears to be largely in the epigastric region and symptoms concerning for gastritis.  GI cocktail given in clinic however unfortunately viscous lidocaine is on backorder so was unable to administer this today.  She denies any significant noticed benefit with the Maalox and Mylanta thus far.  Labs and urinalysis performed for further safety check, urinalysis with 3+ leuks but otherwise within normal limits.  Urine culture pending for further rule out but low suspicion for urinary tract infection causing her symptoms today.  Discussed increase hydration, start Protonix, Carafate and bland foods while awaiting remainder of results.  ED precautions given for worsening symptoms.  Final Clinical Impressions(s) / UC Diagnoses   Final diagnoses:  Generalized abdominal pain  Gastroesophageal reflux disease, unspecified whether esophagitis present  Leukocytes in urine   Discharge Instructions   None    ED Prescriptions     Medication Sig Dispense  Auth. Provider   pantoprazole (PROTONIX) 40 MG tablet Take 1 tablet (40 mg total) by mouth daily. 30 tablet Particia Nearing, [place]   sucralfate (CARAFATE) 1 g tablet Take 1 tablet (1 g total) by mouth 3 (three) times daily as needed. May dissolve 1 tablet into a glass of water and drink as needed 90 tablet Particia Nearing, [place]      PDMP not reviewed this encounter.   Particia Nearing, [place] 04/11/22 1402

## 2022-04-12 ENCOUNTER — Telehealth: Payer: Self-pay

## 2022-04-12 LAB — COMPREHENSIVE METABOLIC PANEL WITH GFR
ALT: 21 [IU]/L (ref 0–32)
AST: 23 [IU]/L (ref 0–40)
Albumin/Globulin Ratio: 1.5 (ref 1.2–2.2)
Albumin: 4.4 g/dL (ref 3.7–4.7)
Alkaline Phosphatase: 78 [IU]/L (ref 44–121)
BUN/Creatinine Ratio: 9 — ABNORMAL LOW (ref 12–28)
BUN: 9 mg/dL (ref 8–27)
Bilirubin Total: 0.7 mg/dL (ref 0.0–1.2)
CO2: 21 mmol/L (ref 20–29)
Calcium: 10.1 mg/dL (ref 8.7–10.3)
Chloride: 97 mmol/L (ref 96–106)
Creatinine, Ser: 0.95 mg/dL (ref 0.57–1.00)
Globulin, Total: 2.9 g/dL (ref 1.5–4.5)
Glucose: 118 mg/dL — ABNORMAL HIGH (ref 70–99)
Potassium: 3.2 mmol/L — ABNORMAL LOW (ref 3.5–5.2)
Sodium: 140 mmol/L (ref 134–144)
Total Protein: 7.3 g/dL (ref 6.0–8.5)
eGFR: 64 mL/min/{1.73_m2}

## 2022-04-12 LAB — CBC WITH DIFFERENTIAL/PLATELET
Basophils Absolute: 0 10*3/uL (ref 0.0–0.2)
Basos: 1 %
EOS (ABSOLUTE): 0 10*3/uL (ref 0.0–0.4)
Eos: 0 %
Hematocrit: 37 % (ref 34.0–46.6)
Hemoglobin: 12.1 g/dL (ref 11.1–15.9)
Immature Grans (Abs): 0 10*3/uL (ref 0.0–0.1)
Immature Granulocytes: 0 %
Lymphocytes Absolute: 1.3 10*3/uL (ref 0.7–3.1)
Lymphs: 22 %
MCH: 28.6 pg (ref 26.6–33.0)
MCHC: 32.7 g/dL (ref 31.5–35.7)
MCV: 88 fL (ref 79–97)
Monocytes Absolute: 0.4 10*3/uL (ref 0.1–0.9)
Monocytes: 7 %
Neutrophils Absolute: 4.3 10*3/uL (ref 1.4–7.0)
Neutrophils: 70 %
Platelets: 298 10*3/uL (ref 150–450)
RBC: 4.23 x10E6/uL (ref 3.77–5.28)
RDW: 11.8 % (ref 11.7–15.4)
WBC: 6.1 10*3/uL (ref 3.4–10.8)

## 2022-04-12 LAB — URINE CULTURE: Culture: NO GROWTH

## 2022-04-12 LAB — LIPASE: Lipase: 21 U/L (ref 14–85)

## 2022-04-12 NOTE — Telephone Encounter (Signed)
Patient aware.

## 2022-04-13 ENCOUNTER — Encounter: Payer: Self-pay | Admitting: Gastroenterology

## 2022-04-13 ENCOUNTER — Ambulatory Visit (INDEPENDENT_AMBULATORY_CARE_PROVIDER_SITE_OTHER): Payer: Medicare Other | Admitting: Gastroenterology

## 2022-04-13 ENCOUNTER — Telehealth: Payer: Self-pay | Admitting: *Deleted

## 2022-04-13 VITALS — BP 127/71 | HR 71 | Temp 97.6°F | Ht 65.0 in | Wt 168.8 lb

## 2022-04-13 DIAGNOSIS — R1013 Epigastric pain: Secondary | ICD-10-CM

## 2022-04-13 MED ORDER — FAMOTIDINE 40 MG PO TABS: 40.0000 mg | ORAL_TABLET | Freq: Every day | ORAL | 5 refills | Status: DC

## 2022-04-13 NOTE — Progress Notes (Signed)
Gastroenterology Office Note     Primary Care Physician:  Fayrene Helper, MD  Primary Gastroenterologist: Dr. Abbey Chatters   Chief Complaint   Chief Complaint  Patient presents with   Abdominal Pain    Went to urgent care 4 days ago. Was having dry mouth, burning sensation in chest and both arms, no taste for food, sweating and weakness. This happens off and on and feeling about the same as when she went to Urgent care.      History of Present Illness   Laura [name] is a 71 y.o. female presenting today in follow-up with a history of chronic abdominal pain, nocturnal flushing, weight loss, and work-up as outlined below, chronic constipation, GERD, colonoscopy 2016 unremarkable with a 10-year recall. Givens capsule in 2018 with gastritis. Most recent EGD June 2021 with a few fundic gland polyps, otherwise unremarkable.  Biopsies did show chronic gastritis negative for H. pylori.  Recently in the ED with epigastric pain. Lipase normal, LFTs normal, no leukocytosis, Hgb 12.1. She notes recurrent epigastric pain. Tearful during visit. No dysphagia. Was given pantoprazole by the ED instead of omeprazole, which she had been taking. She wants to resume omeprazole. BM daily but hasn't had one today. She became tearful with this. She is crying intermittently but then has normal affect. States she is very anxious.  She is requesting an EGD. No NSAIDs.    Additional workup has included:  CT June 2021 with no significant findings.  HIDA September 2021 with slightly low EF at 27%.  labs that had been completed included catecholamines,metanephrines, ESR, CRP, tryptase all of which were ok. 5 HIAA elevated at 13.6. TSH borderline low. CT A/P with and without contrast 10/06/20 with no acute findings.  NM PET with Dotatate 11/13/2020 with no evidence of well-differentiated neuroendocrine tumor within the abdomen or pelvis.  No evidence of metastatic disease.    Past Medical History:   Diagnosis Date   Anxiety    At low risk for fall 08/15/2018   Cystitis    Depression    Gastritis    GERD (gastroesophageal reflux disease)    Hashimoto's thyroiditis    Hx    Hyperlipidemia    Hypertension    Hypothyroidism    Normocytic anemia 2009 Hb 9.9-11.1   Osteopenia    Shoulder pain    Urinary incontinence     Past Surgical History:  Procedure Laterality Date   Bilateral tubal ligation     BIOPSY  04/21/2020   Procedure: BIOPSY;  Surgeon: Daneil Dolin, MD;  Location: [facility];  Service: Endoscopy;;   BREAST EXCISIONAL BIOPSY  2010   Excisional biopsy of benign left breast mass -lopoma    BREAST SURGERY     left nreast biopsy   COLONOSCOPY  08/26/2011   SLF: 1. Internal hemorrhoids   COLONOSCOPY N/A 10/06/2015   ZOX:[id number] fissure or internal hemorrhoids/mild sigmoid colitis. benign colonic path    COLONOSCOPY WITH PROPOFOL N/A 03/15/2021   Procedure: COLONOSCOPY WITH PROPOFOL;  Surgeon: Eloise Harman, DO;  Location: [facility];  Service: Endoscopy;  Laterality: N/A;  3:00pm   colonscopy  2005   Dr. Tamala Julian   ESOPHAGOGASTRODUODENOSCOPY  12/2009   chronic gastritis   ESOPHAGOGASTRODUODENOSCOPY N/A 05/17/2017   Dr. Oneida Alar; gastritis, normal small bowel biopsies, multiple gastric polyps. fundic gland polyps   ESOPHAGOGASTRODUODENOSCOPY (EGD) WITH PROPOFOL N/A 04/21/2020   Rourk: multiple gastric polyps, biopsy of one c/w fundic gland. gastric biopsy showed  mild chronic gastritis but no H.pylori   GIVENS CAPSULE STUDY N/A 05/30/2017   gastritis, no source for anemia identified   left knee surgery Left March 1,2017   PARTIAL KNEE ARTHROPLASTY Left 08/09/2017   Procedure: LEFT UNICOMPARTMENTAL KNEE;  Surgeon: Gaynelle Arabian, MD;  Location: [facility];  Service: Orthopedics;  Laterality: Left;   POLYPECTOMY  04/21/2020   Procedure: POLYPECTOMY;  Surgeon: Daneil Dolin, MD;  Location: [facility];  Service: Endoscopy;;  gastric   Resection of left lobe of thyroid      right carpal tunnel release     rt. knee athroscopy  2004   SAVORY DILATION N/A 05/17/2017   Procedure: SAVORY DILATION;  Surgeon: Danie Binder, MD;  Location: [facility];  Service: Endoscopy;  Laterality: N/A;   TOTAL ABDOMINAL HYSTERECTOMY  1025   UMBILICAL HERNIA REPAIR     Urethral dilation for stenosis  2009    Current Outpatient Medications  Medication Sig Dispense Refill   acetaminophen (TYLENOL) 500 MG tablet Take 1 tablet (500 mg total) by mouth every 6 (six) hours as needed. (Patient taking differently: Take 1,500 mg by mouth daily as needed for mild pain or headache.) 30 tablet 0   amLODipine (NORVASC) 2.5 MG tablet TAKE 1 TABLET BY MOUTH  DAILY 90 tablet 3   atorvastatin (LIPITOR) 80 MG tablet TAKE 1 TABLET BY MOUTH  DAILY 90 tablet 3   citalopram (CELEXA) 40 MG tablet TAKE 1 TABLET BY MOUTH  DAILY 30 tablet 11   famotidine (PEPCID) 40 MG tablet Take 1 tablet (40 mg total) by mouth daily. As needed for heartburn/abdominal pain 30 tablet 5   gabapentin (NEURONTIN) 100 MG capsule TAKE 1 CAPSULE BY MOUTH AT  BEDTIME 90 capsule 3   hydrochlorothiazide (HYDRODIURIL) 25 MG tablet TAKE 1 TABLET BY MOUTH  DAILY 90 tablet 3   Lactobacillus Rhamnosus, GG, (CULTURELLE PO) Take by mouth daily.     levothyroxine (SYNTHROID) 88 MCG tablet TAKE 1 TABLET(88 MCG) BY MOUTH DAILY BEFORE BREAKFAST 90 tablet 1   lisinopril (ZESTRIL) 40 MG tablet Take 40 mg by mouth daily.     montelukast (SINGULAIR) 10 MG tablet TAKE 1 TABLET BY MOUTH AT  BEDTIME 30 tablet 11   Multiple Vitamin (MULTIVITAMIN WITH MINERALS) TABS tablet Take 1 tablet by mouth daily.     pantoprazole (PROTONIX) 40 MG tablet Take 1 tablet (40 mg total) by mouth daily. 30 tablet 0   Polyethyl Glycol-Propyl Glycol (SYSTANE OP) Place 1 drop into both eyes daily as needed (dry eyes).     Potassium 99 MG TABS Take 99 mg by mouth 2 (two) times daily.     sucralfate (CARAFATE) 1 g tablet Take 1 tablet (1 g total) by mouth 3 (three)  times daily as needed. May dissolve 1 tablet into a glass of water and drink as needed 90 tablet 0   traMADol (ULTRAM) 50 MG tablet Take 50 mg by mouth every 8 (eight) hours as needed for moderate pain.     omeprazole (PRILOSEC) 20 MG capsule TAKE 1 CAPSULE(20 MG) BY MOUTH TWICE DAILY BEFORE A MEAL (Patient not taking: Reported on 04/13/2022) 180 capsule 3   No current facility-administered medications for this visit.    Allergies as of 04/13/2022 - Review Complete 04/13/2022  Allergen Reaction Noted   Cozaar [losartan potassium] Cough 07/04/2018   Dilaudid [hydromorphone hcl] Other (See Comments) 03/01/2011   Levaquin [levofloxacin] Other (See Comments) 07/28/2014   Nsaids Other (See Comments) 01/13/2010  Promethazine  05/17/2017    Family History  Problem Relation Age of Onset   Pneumonia Mother    Healthy Father    Colon cancer Neg Hx    Anesthesia problems Neg Hx    Hypotension Neg Hx    Malignant hyperthermia Neg Hx    Pseudochol deficiency Neg Hx     Social History   Socioeconomic History   Marital status: Married    Spouse name: Not on file   Number of children: 3   Years of education: Not on file   Highest education level: Not on file  Occupational History   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   Tobacco Use   Smoking status: Former    Packs/day: 0.25    Years: 1.00    Total pack years: 0.25    Types: Cigarettes    Quit date: 08/25/1972    Years since quitting: 49.6    Passive exposure: Past   Smokeless tobacco: Never  Vaping Use   Vaping Use: Never used  Substance and Sexual Activity   Alcohol use: No   Drug use: No   Sexual activity: Not Currently    Birth control/protection: Surgical    Comment: hyst  Other Topics Concern   Not on file  Social History Narrative   Not on file   Social Determinants of Health   Financial Resource Strain: Low Risk  (08/30/2021)   Overall Financial Resource Strain (CARDIA)    Difficulty of Paying  Living Expenses: Not hard at all  Food Insecurity: No Food Insecurity (08/30/2021)   Hunger Vital Sign    Worried About Running Out of Food in the Last Year: Never true    [place] in the Last Year: Never true  Transportation Needs: No Transportation Needs (08/30/2021)   PRAPARE - Hydrologist (Medical): No    Lack of Transportation (Non-Medical): No  Physical Activity: Inactive (08/30/2021)   Exercise Vital Sign    Days of Exercise per Week: 0 days    Minutes of Exercise per Session: 0 min  Stress: No Stress Concern Present (08/30/2021)   [place]    Feeling of Stress : Not at all  Social Connections: Moderately Integrated (08/30/2021)   Social Connection and Isolation Panel [NHANES]    Frequency of Communication with Friends and Family: More than three times a week    Frequency of Social Gatherings with Friends and Family: Twice a week    Attends Religious Services: More than 4 times per year    Active Member of [employer] or Organizations: No    Attends Archivist Meetings: Never    Marital Status: Married  Human resources officer Violence: Not At Risk (08/30/2021)   Humiliation, Afraid, Rape, and Kick questionnaire    Fear of Current or Ex-Partner: No    Emotionally Abused: No    Physically Abused: No    Sexually Abused: No     Review of Systems   Gen: Denies any fever, chills, fatigue, weight loss, lack of appetite.  CV: Denies chest pain, heart palpitations, peripheral edema, syncope.  Resp: Denies shortness of breath at rest or with exertion. Denies wheezing or cough.  GI: see HPI GU : Denies urinary burning, urinary frequency, urinary hesitancy MS: Denies joint pain, muscle weakness, cramps, or limitation of movement.  Derm: Denies rash, itching, dry skin Psych: Denies depression, anxiety, memory loss, and confusion Heme: Denies bruising, bleeding,  and enlarged lymph  nodes.   Physical Exam   BP 127/71 (BP Location: Right Arm, Patient Position: Sitting, Cuff Size: Large)   Pulse 71   Temp 97.6 F (36.4 C) (Oral)   Ht 5' 5"  (1.651 m)   Wt 168 lb 12.8 oz (76.6 kg)   BMI 28.09 kg/m  General:   Alert and oriented. Pleasant and cooperative. Intermittent crying.  Head:  Normocephalic and atraumatic. Eyes:  Without icterus Abdomen:  +BS, soft, mild TTP epigastric and non-distended. No HSM noted. No guarding or rebound. No masses appreciated. +carnett's sign Rectal:  Deferred  Msk:  Symmetrical without gross deformities. Normal posture. Extremities:  Without edema. Neurologic:  Alert and  oriented x4;  grossly normal neurologically.    Assessment   Laura [name] is a 71 y.o. female presenting today in follow-up with a history of  chronic abdominal pain, nocturnal flushing with extensive evaluation but no evidence for NET as noted above, chronic constipation, and GERD.    Now with recurrent epigastric pain. She is very anxious today, with intermittent crying and then resuming her normal affect. She endorses feeling anxious with symptoms. Recent ED visit reviewed. No alarm signs/symptoms. Labs normal.   We will have her resume omeprazole BID instead of pantoprazole BID at her request. She can continue Carafate. I have added Pepcid as needed. She is desiring an updated EGD. Last in 2021 with fundic gland polyps. Doubt this will be revealing but will pursue in near future.  As of note, positive Carnett's sign on exam. Pending findings on EGD, I feel she could benefit from a TCA each evening. I discussed this possibility briefly with her.    PLAN    Proceed with upper endoscopy by Dr. Abbey Chatters in near future: the risks, benefits, and alternatives have been discussed with the patient in detail. The patient states understanding and desires to proceed.  Change back to omeprazole BID instead of pantoprazole Pepcid prn Consider trial of TCA   Annitta Needs, PhD, [facility] Gastroenterology

## 2022-04-13 NOTE — Telephone Encounter (Signed)
LMOVM to call back to schedule EGD with Dr. Marletta Lor, ASA 2, ASAP

## 2022-04-13 NOTE — Patient Instructions (Signed)
You can stop pantoprazole and get back on omeprazole twice a day. I sent in Pepcid to take daily as needed if you feel you need more help.  Take Miralax in 8 ounces of water any day you don't have a bowel movement.  We are arranging an upper endoscopy with Dr. Marletta Lor!  I enjoyed seeing you again today! As you know, I value our relationship and want to provide genuine, compassionate, and quality care. I welcome your feedback. If you receive a survey regarding your visit,  I greatly appreciate you taking time to fill this out. See you next time!  Gelene Mink, PhD, ANP-BC [facility] Gastroenterology

## 2022-04-14 ENCOUNTER — Encounter: Payer: Self-pay | Admitting: Family Medicine

## 2022-04-14 ENCOUNTER — Other Ambulatory Visit (HOSPITAL_COMMUNITY)
Admission: RE | Admit: 2022-04-14 | Discharge: 2022-04-14 | Disposition: A | Discharge status: Home / Self Care | Payer: Medicare Other | Source: Ambulatory Visit | Attending: Internal Medicine | Admitting: Internal Medicine

## 2022-04-14 ENCOUNTER — Ambulatory Visit (INDEPENDENT_AMBULATORY_CARE_PROVIDER_SITE_OTHER): Payer: Medicare Other | Admitting: Family Medicine

## 2022-04-14 VITALS — BP 124/73 | HR 76 | Resp 16 | Ht 63.0 in | Wt 169.1 lb

## 2022-04-14 DIAGNOSIS — E785 Hyperlipidemia, unspecified: Secondary | ICD-10-CM

## 2022-04-14 DIAGNOSIS — I10 Essential (primary) hypertension: Secondary | ICD-10-CM | POA: Diagnosis not present

## 2022-04-14 DIAGNOSIS — R1013 Epigastric pain: Secondary | ICD-10-CM | POA: Diagnosis not present

## 2022-04-14 DIAGNOSIS — K219 Gastro-esophageal reflux disease without esophagitis: Secondary | ICD-10-CM

## 2022-04-14 DIAGNOSIS — E890 Postprocedural hypothyroidism: Secondary | ICD-10-CM

## 2022-04-14 DIAGNOSIS — E663 Overweight: Secondary | ICD-10-CM | POA: Diagnosis not present

## 2022-04-14 DIAGNOSIS — F411 Generalized anxiety disorder: Secondary | ICD-10-CM

## 2022-04-14 DIAGNOSIS — M1711 Unilateral primary osteoarthritis, right knee: Secondary | ICD-10-CM | POA: Diagnosis not present

## 2022-04-14 DIAGNOSIS — F324 Major depressive disorder, single episode, in partial remission: Secondary | ICD-10-CM

## 2022-04-14 LAB — BASIC METABOLIC PANEL
Anion gap: 8 (ref 5–15)
BUN: 9 mg/dL (ref 8–23)
CO2: 27 mmol/L (ref 22–32)
Calcium: 9.6 mg/dL (ref 8.9–10.3)
Chloride: 101 mmol/L (ref 98–111)
Creatinine, Ser: 0.87 mg/dL (ref 0.44–1.00)
GFR, Estimated: >60 mL/min (ref >60)
Glucose, Bld: 135 mg/dL — ABNORMAL HIGH (ref 70–99)
Potassium: 2.8 mmol/L — ABNORMAL LOW (ref 3.5–5.1)
Sodium: 136 mmol/L (ref 135–145)

## 2022-04-14 NOTE — Pre-Procedure Instructions (Signed)
Lab results routed to Dr Marletta Lor and Hali Marry E at [facility].

## 2022-04-14 NOTE — Telephone Encounter (Signed)
Spoke with pt. She has been scheduled for 6/19 at 3pm. Aware needs to go for lab work today/tomorrow at the hospital. Instructions sent to [place].  PA submitted via [employer]. [id number], DOS: Apr 18, 2022 - Jul 17, 2022

## 2022-04-14 NOTE — Patient Instructions (Addendum)
F/u as before,  flu vaccine at visit, call if you need me sooner  Fasting lipid, cmp and EGFR 5 days before visit   All the best with upper endo  Contact Dr Percell Miller re right knee  Thanks for choosing [facility], we consider it a privelige to serve you.

## 2022-04-15 ENCOUNTER — Encounter (HOSPITAL_COMMUNITY)
Admission: RE | Admit: 2022-04-15 | Discharge: 2022-04-15 | Disposition: A | Discharge status: Home / Self Care | Payer: Medicare Other | Source: Ambulatory Visit | Attending: Internal Medicine | Admitting: Internal Medicine

## 2022-04-15 DIAGNOSIS — E876 Hypokalemia: Secondary | ICD-10-CM

## 2022-04-15 NOTE — Patient Instructions (Addendum)
CATHRYNE MANCEBO  04/15/2022     @PREFPERIOPPHARMACY @   Your procedure is scheduled on  04/18/2022.   Report to 04/20/2022 at  1300 (1:00) P.M.   Call this number if you have problems the morning of surgery:  [phone]   Remember:  Follow the diet instructions given to you by the office.    Take these medicines the morning of surgery with A SIP OF WATER       amlodipine, celexa, pepcid, levothyroxine, prilosec, tramadol.     Do not wear jewelry, make-up or nail polish.  Do not wear lotions, powders, or perfumes, or deodorant.  Do not shave 48 hours prior to surgery.  Men may shave face and neck.  Do not bring valuables to the hospital.  [facility] is not responsible for any belongings or valuables.  Contacts, dentures or bridgework may not be worn into surgery.  Leave your suitcase in the car.  After surgery it may be brought to your room.  For patients admitted to the hospital, discharge time will be determined by your treatment team.  Patients discharged the day of surgery will not be allowed to drive home and must have someone with them for 24 hours.    Special instructions:   DO NOT smoke tobacco or vape for 24 hours before your procedure.  Please read over the following fact sheets that you were given. Anesthesia Post-op Instructions and Care and Recovery After Surgery       Upper Endoscopy, Adult, Care After This sheet gives you information about how to care for yourself after your procedure. Your health care provider may also give you more specific instructions. If you have problems or questions, contact your health care provider. What can I expect after the procedure? After the procedure, it is common to have: A sore throat. Mild stomach pain or discomfort. Bloating. Nausea. Follow these instructions at home:  Follow instructions from your health care provider about what to eat or drink after your procedure. Return to your normal activities as  told by your health care provider. Ask your health care provider what activities are safe for you. Take over-the-counter and prescription medicines only as told by your health care provider. If you were given a sedative during the procedure, it can affect you for several hours. Do not drive or operate machinery until your health care provider says that it is safe. Keep all follow-up visits as told by your health care provider. This is important. Contact a health care provider if you have: A sore throat that lasts longer than one day. Trouble swallowing. Get help right away if: You vomit blood or your vomit looks like coffee grounds. You have: A fever. Bloody, black, or tarry stools. A severe sore throat or you cannot swallow. Difficulty breathing. Severe pain in your chest or abdomen. Summary After the procedure, it is common to have a sore throat, mild stomach discomfort, bloating, and nausea. If you were given a sedative during the procedure, it can affect you for several hours. Do not drive or operate machinery until your health care provider says that it is safe. Follow instructions from your health care provider about what to eat or drink after your procedure. Return to your normal activities as told by your health care provider. This information is not intended to replace advice given to you by your health care provider. Make sure you discuss any questions you have with your health care provider.  Document Revised: 08/23/2019 Document Reviewed: 03/19/2018 Elsevier Patient Education  2023 Elsevier Inc. Monitored Anesthesia Care, Care After This sheet gives you information about how to care for yourself after your procedure. Your health care provider may also give you more specific instructions. If you have problems or questions, contact your health care provider. What can I expect after the procedure? After the procedure, it is common to have: Tiredness. Forgetfulness about what  happened after the procedure. Impaired judgment for important decisions. Nausea or vomiting. Some difficulty with balance. Follow these instructions at home: For the time period you were told by your health care provider:     Rest as needed. Do not participate in activities where you could fall or become injured. Do not drive or use machinery. Do not drink alcohol. Do not take sleeping pills or medicines that cause drowsiness. Do not make important decisions or sign legal documents. Do not take care of children on your own. Eating and drinking Follow the diet that is recommended by your health care provider. Drink enough fluid to keep your urine pale yellow. If you vomit: Drink water, juice, or soup when you can drink without vomiting. Make sure you have little or no nausea before eating solid foods. General instructions Have a responsible adult stay with you for the time you are told. It is important to have someone help care for you until you are awake and alert. Take over-the-counter and prescription medicines only as told by your health care provider. If you have sleep apnea, surgery and certain medicines can increase your risk for breathing problems. Follow instructions from your health care provider about wearing your sleep device: Anytime you are sleeping, including during daytime naps. While taking prescription pain medicines, sleeping medicines, or medicines that make you drowsy. Avoid smoking. Keep all follow-up visits as told by your health care provider. This is important. Contact a health care provider if: You keep feeling nauseous or you keep vomiting. You feel light-headed. You are still sleepy or having trouble with balance after 24 hours. You develop a rash. You have a fever. You have redness or swelling around the IV site. Get help right away if: You have trouble breathing. You have new-onset confusion at home. Summary For several hours after your procedure,  you may feel tired. You may also be forgetful and have poor judgment. Have a responsible adult stay with you for the time you are told. It is important to have someone help care for you until you are awake and alert. Rest as told. Do not drive or operate machinery. Do not drink alcohol or take sleeping pills. Get help right away if you have trouble breathing, or if you suddenly become confused. This information is not intended to replace advice given to you by your health care provider. Make sure you discuss any questions you have with your health care provider. Document Revised: 09/21/2021 Document Reviewed: 09/19/2019 Elsevier Patient Education  2023 [employer].

## 2022-04-15 NOTE — Pre-Procedure Instructions (Signed)
Attempted pre-op phone call. Left message for her to call back on [phone].

## 2022-04-15 NOTE — Pre-Procedure Instructions (Signed)
Spoke with Dr Marletta Lor about 2.8 potassium. Rx called in to [facility] [place]. Patient notified to go pick up and start potassium today.

## 2022-04-17 ENCOUNTER — Encounter (HOSPITAL_COMMUNITY): Payer: Self-pay

## 2022-04-17 ENCOUNTER — Encounter (HOSPITAL_COMMUNITY): Payer: Self-pay | Admitting: Anesthesiology

## 2022-04-17 ENCOUNTER — Other Ambulatory Visit: Payer: Self-pay

## 2022-04-17 ENCOUNTER — Emergency Department (HOSPITAL_COMMUNITY)
Admission: EM | Admit: 2022-04-17 | Discharge: 2022-04-17 | Disposition: A | Discharge status: Home / Self Care | Payer: Medicare Other | Attending: Emergency Medicine | Admitting: Emergency Medicine

## 2022-04-17 DIAGNOSIS — R1010 Upper abdominal pain, unspecified: Secondary | ICD-10-CM | POA: Diagnosis present

## 2022-04-17 DIAGNOSIS — K219 Gastro-esophageal reflux disease without esophagitis: Secondary | ICD-10-CM | POA: Diagnosis not present

## 2022-04-17 DIAGNOSIS — Z79899 Other long term (current) drug therapy: Secondary | ICD-10-CM | POA: Diagnosis not present

## 2022-04-17 MED ORDER — FAMOTIDINE 20 MG PO TABS
40.0000 mg | ORAL_TABLET | Freq: Once | ORAL | Status: AC
  Administered 2022-04-17: 40 mg via ORAL
  Filled 2022-04-17: qty 2

## 2022-04-17 MED ORDER — LIDOCAINE VISCOUS HCL 2 % MT SOLN
15.0000 mL | Freq: Once | OROMUCOSAL | Status: AC
  Administered 2022-04-17: 15 mL via ORAL
  Filled 2022-04-17: qty 15

## 2022-04-17 MED ORDER — ALUM & MAG HYDROXIDE-SIMETH 200-200-20 MG/5ML PO SUSP
30.0000 mL | Freq: Once | ORAL | Status: AC
  Administered 2022-04-17: 30 mL via ORAL
  Filled 2022-04-17: qty 30

## 2022-04-17 NOTE — ED Notes (Signed)
Pt ambulated to restroom. 

## 2022-04-17 NOTE — ED Triage Notes (Signed)
Pt states she is scheduled to have a EGD tomorrow morning but needs something to help her GERD tonight so she can sleep.

## 2022-04-17 NOTE — ED Provider Notes (Signed)
[facility] EMERGENCY DEPARTMENT Provider Note   CSN: [id number] Arrival date & time: 04/17/22  0204     History  Chief Complaint  Patient presents with   Abdominal Pain    Laura [name] is a 71 y.o. female.  Patient presents to the emergency department for evaluation of upper abdominal discomfort consistent with prior episodes of reflux.  Patient reports that she has been taking her Prilosec.  She does not have anything to take for breakthrough symptoms.  She is scheduled for EGD tomorrow.  Patient reports increased symptoms tonight that have kept her up.       Home Medications Prior to Admission medications   Medication Sig Start Date End Date Taking? Authorizing Provider  acetaminophen (TYLENOL) 500 MG tablet Take 1 tablet (500 mg total) by mouth every 6 (six) hours as needed. Patient taking differently: Take 1,500 mg by mouth daily as needed for mild pain or headache. 11/15/19   Avegno, Zachery Dakins, FNP  amLODipine (NORVASC) 2.5 MG tablet TAKE 1 TABLET BY MOUTH  DAILY 10/07/21   Donell Beers, FNP  atorvastatin (LIPITOR) 80 MG tablet TAKE 1 TABLET BY MOUTH  DAILY 11/25/21   Kerri Perches, MD  citalopram (CELEXA) 40 MG tablet TAKE 1 TABLET BY MOUTH  DAILY 06/04/21   Kerri Perches, MD  famotidine (PEPCID) 40 MG tablet Take 1 tablet (40 mg total) by mouth daily. As needed for heartburn/abdominal pain 04/13/22   Gelene Mink, NP  gabapentin (NEURONTIN) 100 MG capsule TAKE 1 CAPSULE BY MOUTH AT  BEDTIME 07/16/21   Kerri Perches, MD  hydrochlorothiazide (HYDRODIURIL) 25 MG tablet TAKE 1 TABLET BY MOUTH  DAILY 07/16/21   Kerri Perches, MD  Lactobacillus Rhamnosus, GG, (CULTURELLE PO) Take by mouth daily.    [provider]  levothyroxine (SYNTHROID) 88 MCG tablet TAKE 1 TABLET(88 MCG) BY MOUTH DAILY BEFORE BREAKFAST 01/31/22   Roma Kayser, MD  lisinopril (ZESTRIL) 40 MG tablet Take 40 mg by mouth daily.    [provider]  montelukast  (SINGULAIR) 10 MG tablet TAKE 1 TABLET BY MOUTH AT  BEDTIME 08/17/21   Kerri Perches, MD  Multiple Vitamin (MULTIVITAMIN WITH MINERALS) TABS tablet Take 1 tablet by mouth daily.    [provider]  omeprazole (PRILOSEC) 20 MG capsule TAKE 1 CAPSULE(20 MG) BY MOUTH TWICE DAILY BEFORE A MEAL 01/21/22   Gelene Mink, NP  Polyethyl Glycol-Propyl Glycol (SYSTANE OP) Place 1 drop into both eyes daily as needed (dry eyes).    [provider]  Potassium 99 MG TABS Take 99 mg by mouth 2 (two) times daily.    [provider]  sucralfate (CARAFATE) 1 g tablet Take 1 tablet (1 g total) by mouth 3 (three) times daily as needed. May dissolve 1 tablet into a glass of water and drink as needed 04/11/22   Particia Nearing, PA-C  traMADol (ULTRAM) 50 MG tablet Take 50 mg by mouth every 8 (eight) hours as needed for moderate pain.    [provider]      Allergies    Cozaar [losartan potassium], Dilaudid [hydromorphone hcl], Levaquin [levofloxacin], Nsaids, and Promethazine    Review of Systems   Review of Systems  Physical Exam Updated Vital Signs BP 139/74   Pulse 81   Temp 98.6 F (37 C) (Oral)   Resp 16   Ht 5\' 3"  (1.6 m)   Wt 76 kg   SpO2 94%  BMI 29.68 kg/m  Physical Exam Vitals and nursing note reviewed.  Constitutional:      General: She is not in acute distress.    Appearance: She is well-developed.  HENT:     Head: Normocephalic and atraumatic.     Mouth/Throat:     Mouth: Mucous membranes are moist.  Eyes:     General: Vision grossly intact. Gaze aligned appropriately.     Extraocular Movements: Extraocular movements intact.     Conjunctiva/sclera: Conjunctivae normal.  Cardiovascular:     Rate and Rhythm: Normal rate and regular rhythm.     Pulses: Normal pulses.     Heart sounds: Normal heart sounds, S1 normal and S2 normal. No murmur heard.    No friction rub. No gallop.  Pulmonary:     Effort: Pulmonary effort is normal. No  respiratory distress.     Breath sounds: Normal breath sounds.  Abdominal:     General: Bowel sounds are normal.     Palpations: Abdomen is soft.     Tenderness: There is abdominal tenderness in the epigastric area. There is no guarding or rebound.     Hernia: No hernia is present.  Musculoskeletal:        General: No swelling.     Cervical back: Full passive range of motion without pain, normal range of motion and neck supple. No spinous process tenderness or muscular tenderness. Normal range of motion.     Right lower leg: No edema.     Left lower leg: No edema.  Skin:    General: Skin is warm and dry.     Capillary Refill: Capillary refill takes less than 2 seconds.     Findings: No ecchymosis, erythema, rash or wound.  Neurological:     General: No focal deficit present.     Mental Status: She is alert and oriented to person, place, and time.     GCS: GCS eye subscore is 4. GCS verbal subscore is 5. GCS motor subscore is 6.     Cranial Nerves: Cranial nerves 2-12 are intact.     Sensory: Sensation is intact.     Motor: Motor function is intact.     Coordination: Coordination is intact.  Psychiatric:        Attention and Perception: Attention normal.        Mood and Affect: Mood normal.        Speech: Speech normal.        Behavior: Behavior normal.     ED Results / Procedures / Treatments   Labs (all labs ordered are listed, but only abnormal results are displayed) Labs Reviewed - No data to display  EKG None  Radiology No results found.  Procedures Procedures    Medications Ordered in ED Medications  alum & mag hydroxide-simeth (MAALOX/MYLANTA) 200-200-20 MG/5ML suspension 30 mL (30 mLs Oral Given 04/17/22 0354)    And  lidocaine (XYLOCAINE) 2 % viscous mouth solution 15 mL (15 mLs Oral Given 04/17/22 0354)  famotidine (PEPCID) tablet 40 mg (40 mg Oral Given 04/17/22 0353)    ED Course/ Medical Decision Making/ A&P                           Medical Decision  Making Risk OTC drugs. Prescription drug management.   Patient presents with reflux symptoms.  Patient has had chronic reflux and is scheduled for endoscopy tomorrow.  She has been taking her Prilosec twice a day  as prescribed.  Patient indicated that she simply wanted something to help with her symptoms.  She was given a GI cocktail and had resolution of her pain.  Prior to discharge, however, she started to report that her symptoms were coming back.  At this point I discussed with her the possibility of further work-up including labs and possibly imaging.  She reports that she did not want to have that done, just wants to control her symptoms through the day so she can get endoscopy tomorrow.  GI cocktail repeated.  Patient counseled that she needs to take her Prilosec twice a day, Pepcid twice a day and to purchase a liquid antacid such as Mylanta or Maalox to use through the day, follow-up with GI tomorrow.  Come back to the ER for any worsening symptoms.        Final Clinical Impression(s) / ED Diagnoses Final diagnoses:  Gastroesophageal reflux disease, unspecified whether esophagitis present    Rx / DC Orders ED Discharge Orders     None         Anthonyjames Bargar, Canary Brim, MD 04/17/22 [phone]

## 2022-04-18 ENCOUNTER — Encounter (HOSPITAL_COMMUNITY)
Admission: RE | Disposition: A | Discharge status: Home / Self Care | Payer: Self-pay | Source: Home / Self Care | Attending: Internal Medicine

## 2022-04-18 ENCOUNTER — Encounter: Payer: Self-pay | Admitting: Family Medicine

## 2022-04-18 ENCOUNTER — Ambulatory Visit (HOSPITAL_COMMUNITY)
Admission: RE | Admit: 2022-04-18 | Discharge: 2022-04-18 | Disposition: A | Discharge status: Home / Self Care | Payer: Medicare Other | Attending: Internal Medicine | Admitting: Internal Medicine

## 2022-04-18 DIAGNOSIS — E890 Postprocedural hypothyroidism: Secondary | ICD-10-CM

## 2022-04-18 DIAGNOSIS — R2681 Unsteadiness on feet: Secondary | ICD-10-CM

## 2022-04-18 DIAGNOSIS — K219 Gastro-esophageal reflux disease without esophagitis: Secondary | ICD-10-CM

## 2022-04-18 DIAGNOSIS — R1013 Epigastric pain: Secondary | ICD-10-CM | POA: Diagnosis not present

## 2022-04-18 DIAGNOSIS — E559 Vitamin D deficiency, unspecified: Secondary | ICD-10-CM

## 2022-04-18 DIAGNOSIS — F322 Major depressive disorder, single episode, severe without psychotic features: Secondary | ICD-10-CM

## 2022-04-18 DIAGNOSIS — E663 Overweight: Secondary | ICD-10-CM

## 2022-04-18 DIAGNOSIS — Z96652 Presence of left artificial knee joint: Secondary | ICD-10-CM

## 2022-04-18 DIAGNOSIS — R6889 Other general symptoms and signs: Secondary | ICD-10-CM

## 2022-04-18 DIAGNOSIS — K5900 Constipation, unspecified: Secondary | ICD-10-CM

## 2022-04-18 DIAGNOSIS — E785 Hyperlipidemia, unspecified: Secondary | ICD-10-CM

## 2022-04-18 DIAGNOSIS — J029 Acute pharyngitis, unspecified: Secondary | ICD-10-CM

## 2022-04-18 DIAGNOSIS — Z5309 Procedure and treatment not carried out because of other contraindication: Secondary | ICD-10-CM | POA: Insufficient documentation

## 2022-04-18 DIAGNOSIS — J309 Allergic rhinitis, unspecified: Secondary | ICD-10-CM

## 2022-04-18 DIAGNOSIS — F411 Generalized anxiety disorder: Secondary | ICD-10-CM

## 2022-04-18 DIAGNOSIS — M25511 Pain in right shoulder: Secondary | ICD-10-CM

## 2022-04-18 DIAGNOSIS — M545 Low back pain, unspecified: Secondary | ICD-10-CM

## 2022-04-18 DIAGNOSIS — R519 Headache, unspecified: Secondary | ICD-10-CM

## 2022-04-18 DIAGNOSIS — R7303 Prediabetes: Secondary | ICD-10-CM

## 2022-04-18 DIAGNOSIS — M542 Cervicalgia: Secondary | ICD-10-CM

## 2022-04-18 DIAGNOSIS — R200 Anesthesia of skin: Secondary | ICD-10-CM

## 2022-04-18 DIAGNOSIS — R232 Flushing: Secondary | ICD-10-CM

## 2022-04-18 DIAGNOSIS — R0781 Pleurodynia: Secondary | ICD-10-CM

## 2022-04-18 DIAGNOSIS — R32 Unspecified urinary incontinence: Secondary | ICD-10-CM

## 2022-04-18 DIAGNOSIS — I10 Essential (primary) hypertension: Secondary | ICD-10-CM

## 2022-04-18 DIAGNOSIS — M5410 Radiculopathy, site unspecified: Secondary | ICD-10-CM

## 2022-04-18 DIAGNOSIS — M4722 Other spondylosis with radiculopathy, cervical region: Secondary | ICD-10-CM

## 2022-04-18 DIAGNOSIS — M79645 Pain in left finger(s): Secondary | ICD-10-CM

## 2022-04-18 DIAGNOSIS — D649 Anemia, unspecified: Secondary | ICD-10-CM

## 2022-04-18 DIAGNOSIS — M179 Osteoarthritis of knee, unspecified: Secondary | ICD-10-CM

## 2022-04-18 LAB — POCT I-STAT, CHEM 8
BUN: 7 mg/dL — ABNORMAL LOW (ref 8–23)
Calcium, Ion: 1.12 mmol/L — ABNORMAL LOW (ref 1.15–1.40)
Chloride: 93 mmol/L — ABNORMAL LOW (ref 98–111)
Creatinine, Ser: 1 mg/dL (ref 0.44–1.00)
Glucose, Bld: 94 mg/dL (ref 70–99)
HCT: 37 % (ref 36.0–46.0)
Hemoglobin: 12.6 g/dL (ref 12.0–15.0)
Potassium: 2.6 mmol/L — CL (ref 3.5–5.1)
Sodium: 135 mmol/L (ref 135–145)
TCO2: 29 mmol/L (ref 22–32)

## 2022-04-18 SURGERY — ESOPHAGOGASTRODUODENOSCOPY (EGD) WITH PROPOFOL
Anesthesia: Monitor Anesthesia Care

## 2022-04-18 NOTE — Progress Notes (Signed)
Pt procedure cancelled per Dr. Alva Garnet, k 2.6 . PT verbalizes understanding. PT waiting at bedside to see Dr. Marletta Lor before leaving.

## 2022-04-18 NOTE — Progress Notes (Signed)
Patient stated she took the potassium that was called in.  She stated she has been nauseated and spitting up fleam.

## 2022-04-19 ENCOUNTER — Other Ambulatory Visit: Payer: Self-pay | Admitting: Family Medicine

## 2022-04-19 ENCOUNTER — Encounter: Payer: Self-pay | Admitting: Family Medicine

## 2022-04-19 ENCOUNTER — Telehealth: Payer: Self-pay

## 2022-04-19 DIAGNOSIS — R634 Abnormal weight loss: Secondary | ICD-10-CM

## 2022-04-19 DIAGNOSIS — R109 Unspecified abdominal pain: Secondary | ICD-10-CM

## 2022-04-19 MED ORDER — POTASSIUM CHLORIDE CRYS ER 20 MEQ PO TBCR: EXTENDED_RELEASE_TABLET | ORAL | 0 refills | Status: DC

## 2022-04-19 MED ORDER — CALCIUM 1200 1200-1000 MG-UNIT PO CHEW: CHEWABLE_TABLET | ORAL | 5 refills | Status: AC

## 2022-04-19 NOTE — Assessment & Plan Note (Signed)
Uncontrolled with chronic pain, upper endo in near future

## 2022-04-19 NOTE — Assessment & Plan Note (Signed)
Controlled, no change in medication DASH diet and commitment to daily physical activity for a minimum of 30 minutes discussed and encouraged, as a part of hypertension management. The importance of attaining a healthy weight is also discussed.     04/18/2022    1:55 PM 04/17/2022    6:21 AM 04/17/2022    5:30 AM 04/17/2022    4:30 AM 04/17/2022    3:46 AM 04/17/2022    3:45 AM 04/14/2022   10:13 AM  BP/Weight  Systolic BP 136 138 139 140 141  124  Diastolic BP 69 69 74 73 73  73  Wt. (Lbs)      167.55 169.12  BMI      29.68 kg/m2 29.96 kg/m2

## 2022-04-19 NOTE — Assessment & Plan Note (Signed)
Controlled and managed by Endo 

## 2022-04-19 NOTE — Telephone Encounter (Signed)
FYI: Phoned and advised the pt of the note. Pt declined ED visit . Pt will wait to be scheduled for CTA. Advised to have EGD rescheduled.

## 2022-04-19 NOTE — Telephone Encounter (Signed)
Pt has called x 3 this am regarding chest pain. She was seen in the ED yesterday and the 18th. She states the pain level is 8. Pain underneath her bra, no SOB. States she has gas and indigestion. Pt states she has not been eating nor does she have a desire to eat. She states she is loosing weight. Pt states she is wanting a chest X-ray please. She states she has complained about this so many times and no one is/ has done a chest x-ray. I advised her to go back to ED but she wants you to put in a order to have a chest x-ray.

## 2022-04-19 NOTE — H&P (Signed)
Patient presented for EGD.  Potassium low at 2.6 despite oral supplementation.  Anesthesia has recommended rescheduling.  Procedure canceled for today.  Recommended patient call her PCP Dr. Lodema Hong to further discuss her hypokalemia.  She states she will.

## 2022-04-19 NOTE — Assessment & Plan Note (Signed)
Hyperlipidemia:Low fat diet discussed and encouraged.   Lipid Panel  Lab Results  Component Value Date   CHOL 157 01/05/2022   HDL 49 01/05/2022   LDLCALC 84 01/05/2022   TRIG 140 01/05/2022   CHOLHDL 3.2 01/05/2022     Controlled, no change in medication

## 2022-04-19 NOTE — Assessment & Plan Note (Signed)
Increased right knee pain and instability, plans to have this addressed after GI issues are taken care of

## 2022-04-19 NOTE — Telephone Encounter (Signed)
lmtrc

## 2022-04-19 NOTE — Telephone Encounter (Signed)
EGD was postponed due to low potassium.   If having chest pain, she needs to go to the ED.    However, after the ED has seen her, I recommend CTA as she is reporting abdominal pain and weight loss. Still needs EGD rescheduled.

## 2022-04-19 NOTE — Progress Notes (Signed)
Laura [name]     MRN: [id number]      DOB: [birth year]   HPI [name] is here for follow up and re-evaluation of chronic medical conditions, medication management and review of any available recent lab and radiology data.  Was in urgent care on 6/12 due to uncontrolled burning stomach pain, which she has c/o for months, had appt wit GI subsequently and has upper endo scheduled for next week Currently on PPI, carafate and H2 blocker, still has pain and is looking forward to the procedure to deal with any underlying issue as she has reported pain for months Otherwise she is doing well Preventive health is updated, specifically  Cancer screening and Immunization.   The PT denies any adverse reactions to current medications since the last visit.  plaints ROS Denies recent fever or chills. Denies sinus pressure, nasal congestion, ear pain or sore throat. Denies chest congestion, productive cough or wheezing. Denies chest pains, palpitations and leg swelling Denies  vomiting,diarrhea or constipation.   Denies dysuria, frequency, hesitancy or incontinence. C/o right knee pain, just recovering from carpal tunnel surgery Denies headaches, seizures, numbness, or tingling. Denies depression,  uncontrolled anxiety or insomnia. Denies skin break down or rash.   PE  BP 124/73   Pulse 76   Resp 16   Ht 5\' 3"  (1.6 m)   Wt 169 lb 1.9 oz (76.7 kg)   SpO2 95%   BMI 29.96 kg/m   Patient alert and oriented and in no cardiopulmonary distress.  HEENT: No facial asymmetry, EOMI,     Neck supple .  Chest: Clear to auscultation bilaterally.  CVS: S1, S2 no murmurs, no S3.Regular rate.  ABD: Soft mild epigastric tenderness, no guarding or reboundr.   Ext: No edema  MS: Adequate ROM spine, shoulders, hips and knees.  Skin: Intact, no ulcerations or rash noted.  Psych: Good eye contact, normal affect. Memory intact not anxious or depressed appearing.  CNS: CN 2-12 intact, power,  normal  throughout.no focal deficits noted.   Assessment & Plan  Essential hypertension Controlled, no change in medication DASH diet and commitment to daily physical activity for a minimum of 30 minutes discussed and encouraged, as a part of hypertension management. The importance of attaining a healthy weight is also discussed.     04/18/2022    1:55 PM 04/17/2022    6:21 AM 04/17/2022    5:30 AM 04/17/2022    4:30 AM 04/17/2022    3:46 AM 04/17/2022    3:45 AM 04/14/2022   10:13 AM  BP/Weight  Systolic BP 136 138 139 140 141  124  Diastolic BP 69 69 74 73 73  73  Wt. (Lbs)      167.55 169.12  BMI      29.68 kg/m2 29.96 kg/m2       Hyperlipidemia LDL goal <100 Hyperlipidemia:Low fat diet discussed and encouraged.   Lipid Panel  Lab Results  Component Value Date   CHOL 157 01/05/2022   HDL 49 01/05/2022   LDLCALC 84 01/05/2022   TRIG 140 01/05/2022   CHOLHDL 3.2 01/05/2022     Controlled, no change in medication   Overweight (BMI 25.0-29.9)  Patient re-educated about  the importance of commitment to a  minimum of 150 minutes of exercise per week as able.  The importance of healthy food choices with portion control discussed, as well as eating regularly and within a 12 hour window most days. The need to choose "clean ,  green" food 50 to 75% of the time is discussed, as well as to make water the primary drink and set a goal of 64 ounces water daily.       04/17/2022    3:45 AM 04/14/2022   10:13 AM 04/13/2022    9:21 AM  Weight /BMI  Weight 167 lb 8.8 oz 169 lb 1.9 oz 168 lb 12.8 oz  Height 5\' 3"  (1.6 m) 5\' 3"  (1.6 m) 5\' 5"  (1.651 m)  BMI 29.68 kg/m2 29.96 kg/m2 28.09 kg/m2      OA (osteoarthritis) of knee Increased right knee pain and instability, plans to have this addressed after GI issues are taken care of  POSTSURGICAL HYPOTHYROIDISM Controlled and managed by Endo  Gastroesophageal reflux disease Uncontrolled with chronic pain, upper endo in near  future  Depression, major, single episode, in partial remission (HCC) Controlled, no change in medication   GAD (generalized anxiety disorder) Controlled, no change in medication

## 2022-04-19 NOTE — Assessment & Plan Note (Signed)
Controlled, no change in medication  

## 2022-04-19 NOTE — Assessment & Plan Note (Signed)
  Patient re-educated about  the importance of commitment to a  minimum of 150 minutes of exercise per week as able.  The importance of healthy food choices with portion control discussed, as well as eating regularly and within a 12 hour window most days. The need to choose "clean , green" food 50 to 75% of the time is discussed, as well as to make water the primary drink and set a goal of 64 ounces water daily.       04/17/2022    3:45 AM 04/14/2022   10:13 AM 04/13/2022    9:21 AM  Weight /BMI  Weight 167 lb 8.8 oz 169 lb 1.9 oz 168 lb 12.8 oz  Height 5\' 3"  (1.6 m) 5\' 3"  (1.6 m) 5\' 5"  (1.651 m)  BMI 29.68 kg/m2 29.96 kg/m2 28.09 kg/m2

## 2022-04-20 ENCOUNTER — Ambulatory Visit: Payer: Medicare Other | Admitting: Family Medicine

## 2022-04-20 ENCOUNTER — Encounter: Payer: Self-pay | Admitting: Family Medicine

## 2022-04-20 NOTE — Telephone Encounter (Signed)
Called pt, LMOVM to call back with CTA appt. Also sent mychart letter  CTA a/p scheduled for 6/26, arrival 3:00pm, npo 4 hrs prior

## 2022-04-20 NOTE — Telephone Encounter (Signed)
Schedule for CTA first, then we can do EGD. She never went to the ED.

## 2022-04-20 NOTE — Telephone Encounter (Signed)
Do you want to review the ED notes? Okay to go ahead and reschedule EGD and schedule for CTA?

## 2022-04-20 NOTE — Telephone Encounter (Signed)
[facility] and sent mychart msg to call our office

## 2022-04-20 NOTE — Addendum Note (Signed)
Addended by: Armstead Peaks on: 04/20/2022 11:58 AM   Modules accepted: Orders

## 2022-04-21 NOTE — Telephone Encounter (Signed)
Patient aware.

## 2022-04-22 ENCOUNTER — Ambulatory Visit: Payer: Medicare Other | Admitting: Family Medicine

## 2022-04-22 ENCOUNTER — Other Ambulatory Visit (HOSPITAL_COMMUNITY)
Admission: RE | Admit: 2022-04-22 | Discharge: 2022-04-22 | Disposition: A | Discharge status: Home / Self Care | Payer: Medicare Other | Source: Ambulatory Visit | Attending: Family Medicine | Admitting: Family Medicine

## 2022-04-22 ENCOUNTER — Encounter: Payer: Self-pay | Admitting: Family Medicine

## 2022-04-22 ENCOUNTER — Ambulatory Visit (INDEPENDENT_AMBULATORY_CARE_PROVIDER_SITE_OTHER): Payer: Medicare Other | Admitting: Family Medicine

## 2022-04-22 VITALS — BP 123/80 | HR 72 | Ht 63.0 in | Wt 166.0 lb

## 2022-04-22 DIAGNOSIS — E876 Hypokalemia: Secondary | ICD-10-CM

## 2022-04-22 DIAGNOSIS — K219 Gastro-esophageal reflux disease without esophagitis: Secondary | ICD-10-CM | POA: Diagnosis not present

## 2022-04-22 DIAGNOSIS — I10 Essential (primary) hypertension: Secondary | ICD-10-CM

## 2022-04-22 LAB — BASIC METABOLIC PANEL
Anion gap: 9 (ref 5–15)
BUN: 8 mg/dL (ref 8–23)
CO2: 29 mmol/L (ref 22–32)
Calcium: 9.4 mg/dL (ref 8.9–10.3)
Chloride: 99 mmol/L (ref 98–111)
Creatinine, Ser: 0.92 mg/dL (ref 0.44–1.00)
GFR, Estimated: >60 mL/min (ref >60)
Glucose, Bld: 107 mg/dL — ABNORMAL HIGH (ref 70–99)
Potassium: 3 mmol/L — ABNORMAL LOW (ref 3.5–5.1)
Sodium: 137 mmol/L (ref 135–145)

## 2022-04-22 NOTE — Assessment & Plan Note (Signed)
Chronic diusabling epigastric pain x monhts , has upcoming endoscopy, pening normal potassium

## 2022-04-25 ENCOUNTER — Ambulatory Visit (HOSPITAL_COMMUNITY)
Admission: RE | Admit: 2022-04-25 | Discharge: 2022-04-25 | Disposition: A | Discharge status: Home / Self Care | Payer: Medicare Other | Source: Ambulatory Visit | Attending: Gastroenterology | Admitting: Gastroenterology

## 2022-04-25 DIAGNOSIS — R634 Abnormal weight loss: Secondary | ICD-10-CM | POA: Insufficient documentation

## 2022-04-25 DIAGNOSIS — K5900 Constipation, unspecified: Secondary | ICD-10-CM | POA: Diagnosis not present

## 2022-04-25 DIAGNOSIS — R109 Unspecified abdominal pain: Secondary | ICD-10-CM | POA: Diagnosis not present

## 2022-04-25 MED ORDER — IOHEXOL 350 MG/ML SOLN
100.0000 mL | Freq: Once | INTRAVENOUS | Status: AC | PRN
  Administered 2022-04-25: 100 mL via INTRAVENOUS

## 2022-04-26 ENCOUNTER — Encounter: Payer: Self-pay | Admitting: Family Medicine

## 2022-04-27 ENCOUNTER — Encounter: Payer: Self-pay | Admitting: Family Medicine

## 2022-05-02 DIAGNOSIS — G5603 Carpal tunnel syndrome, bilateral upper limbs: Secondary | ICD-10-CM | POA: Diagnosis not present

## 2022-05-02 DIAGNOSIS — M25562 Pain in left knee: Secondary | ICD-10-CM | POA: Diagnosis not present

## 2022-05-02 DIAGNOSIS — M25561 Pain in right knee: Secondary | ICD-10-CM | POA: Diagnosis not present

## 2022-05-04 DIAGNOSIS — M542 Cervicalgia: Secondary | ICD-10-CM | POA: Diagnosis not present

## 2022-05-24 ENCOUNTER — Encounter: Payer: Self-pay | Admitting: Family Medicine

## 2022-05-26 ENCOUNTER — Ambulatory Visit (INDEPENDENT_AMBULATORY_CARE_PROVIDER_SITE_OTHER): Payer: Medicare Other

## 2022-05-26 ENCOUNTER — Encounter: Payer: Self-pay | Admitting: Family Medicine

## 2022-05-26 VITALS — BP 152/82

## 2022-05-26 DIAGNOSIS — I10 Essential (primary) hypertension: Secondary | ICD-10-CM

## 2022-05-26 NOTE — Progress Notes (Signed)
Pt has not taking bp meds today

## 2022-06-07 ENCOUNTER — Ambulatory Visit (INDEPENDENT_AMBULATORY_CARE_PROVIDER_SITE_OTHER): Payer: Medicare Other | Admitting: Family Medicine

## 2022-06-07 VITALS — BP 150/80 | HR 78 | Ht 63.0 in | Wt 172.0 lb

## 2022-06-07 DIAGNOSIS — F411 Generalized anxiety disorder: Secondary | ICD-10-CM | POA: Diagnosis not present

## 2022-06-07 DIAGNOSIS — R600 Localized edema: Secondary | ICD-10-CM

## 2022-06-07 DIAGNOSIS — R232 Flushing: Secondary | ICD-10-CM

## 2022-06-07 DIAGNOSIS — E785 Hyperlipidemia, unspecified: Secondary | ICD-10-CM | POA: Diagnosis not present

## 2022-06-07 DIAGNOSIS — I10 Essential (primary) hypertension: Secondary | ICD-10-CM | POA: Diagnosis not present

## 2022-06-07 MED ORDER — FUROSEMIDE 20 MG PO TABS: 20.0000 mg | ORAL_TABLET | Freq: Every day | ORAL | 1 refills | Status: DC

## 2022-06-07 MED ORDER — AMLODIPINE BESYLATE 5 MG PO TABS: 5.0000 mg | ORAL_TABLET | Freq: Every day | ORAL | 3 refills | Status: DC

## 2022-06-07 NOTE — Progress Notes (Signed)
   Laura [name]     MRN: [id number]      DOB: 12-Mar-1951   HPI Laura [name] is here for follow up and re-evaluation of chronic medical conditions, medication management and review of any available recent lab and radiology data.  Preventive health is updated, specifically  Cancer screening and Immunization.   Questions or concerns regarding consultations or procedures which the PT has had in the interim are  addressed. The PT denies any adverse reactions to current medications since the last visit.  C/o leg swellling   ROS Denies recent fever or chills. Denies sinus pressure, nasal congestion, ear pain or sore throat. Denies chest congestion, productive cough or wheezing. Denies chest pains, palpitations PND or orthopnea Denies abdominal pain, nausea, vomiting,diarrhea or constipation.   Denies dysuria, frequency, hesitancy or incontinence. Denies joint pain, swelling and limitation in mobility. Denies headaches, seizures, numbness, or tingling. Denies depression, anxiety or insomnia. Denies skin break down or rash.   PE  BP (!) 150/80   Pulse 78   Ht 5\' 3"  (1.6 m)   Wt 172 lb (78 kg)   SpO2 95%   BMI 30.47 kg/m   Patient alert and oriented and in no cardiopulmonary distress.  HEENT: No facial asymmetry, EOMI,     Neck supple .  Chest: Clear to auscultation bilaterally.  CVS: S1, S2 no murmurs, no S3.Regular rate.  ABD: Soft non tender.   Ext: Trace bilateral edema  MS: Adequate ROM spine, shoulders, hips and knees.  Skin: Intact, no ulcerations or rash noted.  Psych: Good eye contact, normal affect. Memory intact not anxious or depressed appearing.  CNS: CN 2-12 intact, power,  normal throughout.no focal deficits noted.   Assessment & Plan  Essential hypertension Uncontrolled inc dose of amlodipine and re eval DASH diet and commitment to daily physical activity for a minimum of 30 minutes discussed and encouraged, as a part of hypertension management. The  importance of attaining a healthy weight is also discussed.     06/07/2022    2:38 PM 06/07/2022    2:01 PM 05/26/2022    8:38 AM 04/22/2022    8:55 AM 04/18/2022    1:55 PM 04/17/2022    6:21 AM 04/17/2022    5:30 AM  BP/Weight  Systolic BP 150 148 152 123 136 138 139  Diastolic BP 80 84 82 80 69 69 74  Wt. (Lbs)  172  166.04     BMI  30.47 kg/m2  29.41 kg/m2          GAD (generalized anxiety disorder) Controlled, no change in medication   Lower leg edema Start daily low dose lasix, reduce salt intake and elevate legs  Hot flashes Improved on medication  Hyperlipidemia LDL goal <100 Hyperlipidemia:Low fat diet discussed and encouraged.   Lipid Panel  Lab Results  Component Value Date   CHOL 157 01/05/2022   HDL 49 01/05/2022   LDLCALC 84 01/05/2022   TRIG 140 01/05/2022   CHOLHDL 3.2 01/05/2022   Controlled, no change in medication

## 2022-06-07 NOTE — Patient Instructions (Signed)
F/U to re evaluate blood pressure and leg swelling in 2 weeks, call if you need me sooner  New additional amlodipine 5 mg take one daily and continue amlodipine 2.5 mg daily  New for leg swelling is once daily furosemide  Chem 7 and GFR today  Please send messAGE TO LET ME KNOW IF YOU ARE ACTUALLY TAKING POTASSIUM AND THE DOSE, FROM THE RECORD I DO NOT THINK YOU HAVE ANY  PLEASE BRING YOUR BASKET OF MEDICATIONS TO YOUR NEXT VISIT  Thanks for choosing [facility], we consider it a privelige to serve you.

## 2022-06-08 LAB — BMP8+EGFR
BUN/Creatinine Ratio: 19 (ref 12–28)
BUN: 17 mg/dL (ref 8–27)
CO2: 29 mmol/L (ref 20–29)
Calcium: 9.8 mg/dL (ref 8.7–10.3)
Chloride: 97 mmol/L (ref 96–106)
Creatinine, Ser: 0.91 mg/dL (ref 0.57–1.00)
Glucose: 104 mg/dL — ABNORMAL HIGH (ref 70–99)
Potassium: 3.9 mmol/L (ref 3.5–5.2)
Sodium: 140 mmol/L (ref 134–144)
eGFR: 67 mL/min/{1.73_m2} (ref >59)

## 2022-06-09 ENCOUNTER — Encounter: Payer: Self-pay | Admitting: Family Medicine

## 2022-06-12 ENCOUNTER — Encounter: Payer: Self-pay | Admitting: Family Medicine

## 2022-06-12 DIAGNOSIS — R600 Localized edema: Secondary | ICD-10-CM | POA: Insufficient documentation

## 2022-06-12 NOTE — Assessment & Plan Note (Signed)
Uncontrolled inc dose of amlodipine and re eval DASH diet and commitment to daily physical activity for a minimum of 30 minutes discussed and encouraged, as a part of hypertension management. The importance of attaining a healthy weight is also discussed.     06/07/2022    2:38 PM 06/07/2022    2:01 PM 05/26/2022    8:38 AM 04/22/2022    8:55 AM 04/18/2022    1:55 PM 04/17/2022    6:21 AM 04/17/2022    5:30 AM  BP/Weight  Systolic BP 150 148 152 123 136 138 139  Diastolic BP 80 84 82 80 69 69 74  Wt. (Lbs)  172  166.04     BMI  30.47 kg/m2  29.41 kg/m2

## 2022-06-12 NOTE — Assessment & Plan Note (Signed)
Start daily low dose lasix, reduce salt intake and elevate legs

## 2022-06-12 NOTE — Assessment & Plan Note (Signed)
Hyperlipidemia:Low fat diet discussed and encouraged.   Lipid Panel  Lab Results  Component Value Date   CHOL 157 01/05/2022   HDL 49 01/05/2022   LDLCALC 84 01/05/2022   TRIG 140 01/05/2022   CHOLHDL 3.2 01/05/2022   Controlled, no change in medication

## 2022-06-12 NOTE — Assessment & Plan Note (Signed)
Improved on medication °

## 2022-06-12 NOTE — Assessment & Plan Note (Signed)
Controlled, no change in medication  

## 2022-06-13 ENCOUNTER — Telehealth: Payer: Self-pay | Admitting: Family Medicine

## 2022-06-13 NOTE — Telephone Encounter (Signed)
Spoke with patient.

## 2022-06-13 NOTE — Telephone Encounter (Signed)
Patient came by office this morning. Patient states she got a call from [facility] stating that there is no record of 2nd shingrix .  Patient states she got vaccine at [facility] on [address]   Also states that patient is behind on 2 shots   Patient wants a call back in regard.

## 2022-06-14 ENCOUNTER — Other Ambulatory Visit: Payer: Self-pay | Admitting: Family Medicine

## 2022-06-15 ENCOUNTER — Other Ambulatory Visit: Payer: Self-pay | Admitting: Family Medicine

## 2022-06-21 ENCOUNTER — Encounter: Payer: Self-pay | Admitting: Family Medicine

## 2022-06-21 ENCOUNTER — Ambulatory Visit (INDEPENDENT_AMBULATORY_CARE_PROVIDER_SITE_OTHER): Payer: Medicare Other | Admitting: Family Medicine

## 2022-06-21 VITALS — BP 120/70 | HR 74 | Ht 63.0 in | Wt 172.0 lb

## 2022-06-21 DIAGNOSIS — I10 Essential (primary) hypertension: Secondary | ICD-10-CM | POA: Diagnosis not present

## 2022-06-21 DIAGNOSIS — E785 Hyperlipidemia, unspecified: Secondary | ICD-10-CM

## 2022-06-21 DIAGNOSIS — R7303 Prediabetes: Secondary | ICD-10-CM

## 2022-06-21 DIAGNOSIS — N644 Mastodynia: Secondary | ICD-10-CM | POA: Diagnosis not present

## 2022-06-21 MED ORDER — AMLODIPINE BESYLATE 5 MG PO TABS: 5.0000 mg | ORAL_TABLET | Freq: Every day | ORAL | 3 refills | Status: DC

## 2022-06-21 NOTE — Progress Notes (Unsigned)
   Laura [name]     MRN: [id number]      DOB: 1951/10/14   HPI Laura [name] is here for follow up and re-evaluation of chronic medical conditions, medication management and review of any available recent lab and radiology data.  Preventive health is updated, specifically  Cancer screening and Immunization.   Questions or concerns regarding consultations or procedures which the PT has had in the interim are  addressed. The PT denies any adverse reactions to current medications since the last visit.  1` week h/o localized left breast pain, no trauma thinks she feels a lump  ROS Denies recent fever or chills. Denies sinus pressure, nasal congestion, ear pain or sore throat. Denies chest congestion, productive cough or wheezing. Denies chest pains, palpitations and leg swelling Denies abdominal pain, nausea, vomiting,diarrhea or constipation.   Denies dysuria, frequency, hesitancy or incontinence. Denies joint pain, swelling and limitation in mobility. Denies headaches, seizures, numbness, or tingling. Denies depression, anxiety or insomnia. Denies skin break down or rash.   PE  BP 120/70   Pulse 74   Ht 5\' 3"  (1.6 m)   Wt 172 lb (78 kg)   SpO2 95%   BMI 30.47 kg/m   Patient alert and oriented and in no cardiopulmonary distress.  HEENT: No facial asymmetry, EOMI,     Neck supple .  Chest: Clear to auscultation bilaterally.  CVS: S1, S2 no murmurs, no S3.Regular rate.  ABD: Soft non tender.   Ext: No edema  MS: Adequate ROM spine, shoulders, hips and knees.  Skin: Intact, no ulcerations or rash noted.  Psych: Good eye contact, normal affect. Memory intact not anxious or depressed appearing.  CNS: CN 2-12 intact, power,  normal throughout.no focal deficits noted.   Assessment & Plan  ***

## 2022-06-21 NOTE — Patient Instructions (Signed)
Annual exam in January, call if you ned me sooner   Keep wellness appt   Need and call for flu vaccine in Septemeber  Lipid, hepatic panel today   Please order and schedule [name] of left breast and diagnostic left breast mammogram dx is left breast pain   Thanks for choosing [facility], we consider it a privelige to serve you.

## 2022-06-21 NOTE — Assessment & Plan Note (Signed)
Controlled, no change in medication DASH diet and commitment to daily physical activity for a minimum of 30 minutes discussed and encouraged, as a part of hypertension management. The importance of attaining a healthy weight is also discussed.     06/21/2022    2:21 PM 06/21/2022    1:39 PM 06/07/2022    2:38 PM 06/07/2022    2:01 PM 05/26/2022    8:38 AM 04/22/2022    8:55 AM 04/18/2022    1:55 PM  BP/Weight  Systolic BP 120 126 150 148 152 123 136  Diastolic BP 70 80 80 84 82 80 69  Wt. (Lbs)  172  172  166.04   BMI  30.47 kg/m2  30.47 kg/m2  29.41 kg/m2

## 2022-06-22 ENCOUNTER — Encounter: Payer: Self-pay | Admitting: Family Medicine

## 2022-06-22 LAB — LIPID PANEL
Chol/HDL Ratio: 3.3 ratio (ref 0.0–4.4)
Cholesterol, Total: 249 mg/dL — ABNORMAL HIGH (ref 100–199)
HDL: 76 mg/dL (ref >39)
LDL Chol Calc (NIH): 152 mg/dL — ABNORMAL HIGH (ref 0–99)
Triglycerides: 122 mg/dL (ref 0–149)
VLDL Cholesterol Cal: 21 mg/dL (ref 5–40)

## 2022-06-22 LAB — HEPATIC FUNCTION PANEL
ALT: 22 IU/L (ref 0–32)
AST: 18 IU/L (ref 0–40)
Albumin: 4.4 g/dL (ref 3.8–4.8)
Alkaline Phosphatase: 87 IU/L (ref 44–121)
Bilirubin Total: 0.7 mg/dL (ref 0.0–1.2)
Bilirubin, Direct: 0.15 mg/dL (ref 0.00–0.40)
Total Protein: 7.2 g/dL (ref 6.0–8.5)

## 2022-06-22 MED ORDER — FENOFIBRATE 48 MG PO TABS: 48.0000 mg | ORAL_TABLET | Freq: Every day | ORAL | 0 refills | Status: DC

## 2022-06-22 NOTE — Assessment & Plan Note (Signed)
Hyperlipidemia:Low fat diet discussed and encouraged.   Lipid Panel  Lab Results  Component Value Date   CHOL 249 (H) 06/21/2022   HDL 76 06/21/2022   LDLCALC 152 (H) 06/21/2022   TRIG 122 06/21/2022   CHOLHDL 3.3 06/21/2022   neds to lowwr fat intake and fenofibrate is added

## 2022-06-22 NOTE — Assessment & Plan Note (Signed)
Reports left breast lump and[pain x 2 weeks, needs imaging

## 2022-06-23 DIAGNOSIS — G5603 Carpal tunnel syndrome, bilateral upper limbs: Secondary | ICD-10-CM | POA: Diagnosis not present

## 2022-06-23 DIAGNOSIS — M5412 Radiculopathy, cervical region: Secondary | ICD-10-CM | POA: Diagnosis not present

## 2022-06-27 ENCOUNTER — Encounter: Payer: Self-pay | Admitting: Family Medicine

## 2022-07-06 ENCOUNTER — Ambulatory Visit (HOSPITAL_COMMUNITY)
Admission: RE | Admit: 2022-07-06 | Discharge: 2022-07-06 | Disposition: A | Discharge status: Home / Self Care | Payer: Medicare Other | Source: Ambulatory Visit | Attending: Family Medicine | Admitting: Family Medicine

## 2022-07-06 ENCOUNTER — Ambulatory Visit: Payer: Medicare Other | Admitting: Family Medicine

## 2022-07-06 DIAGNOSIS — N6323 Unspecified lump in the left breast, lower outer quadrant: Secondary | ICD-10-CM | POA: Diagnosis not present

## 2022-07-06 DIAGNOSIS — N644 Mastodynia: Secondary | ICD-10-CM | POA: Insufficient documentation

## 2022-07-06 DIAGNOSIS — R928 Other abnormal and inconclusive findings on diagnostic imaging of breast: Secondary | ICD-10-CM | POA: Diagnosis not present

## 2022-07-06 DIAGNOSIS — N6321 Unspecified lump in the left breast, upper outer quadrant: Secondary | ICD-10-CM | POA: Diagnosis not present

## 2022-07-11 DIAGNOSIS — E890 Postprocedural hypothyroidism: Secondary | ICD-10-CM | POA: Diagnosis not present

## 2022-07-12 LAB — T4, FREE: Free T4: 1.48 ng/dL (ref 0.82–1.77)

## 2022-07-12 LAB — TSH: TSH: 1.05 u[IU]/mL (ref 0.450–4.500)

## 2022-07-13 ENCOUNTER — Ambulatory Visit (INDEPENDENT_AMBULATORY_CARE_PROVIDER_SITE_OTHER): Payer: Medicare Other

## 2022-07-13 DIAGNOSIS — Z23 Encounter for immunization: Secondary | ICD-10-CM | POA: Diagnosis not present

## 2022-07-15 DIAGNOSIS — M5412 Radiculopathy, cervical region: Secondary | ICD-10-CM | POA: Diagnosis not present

## 2022-07-18 ENCOUNTER — Encounter: Payer: Self-pay | Admitting: "Endocrinology

## 2022-07-18 ENCOUNTER — Ambulatory Visit: Payer: Medicare Other | Admitting: "Endocrinology

## 2022-07-18 VITALS — BP 138/78 | HR 60 | Ht 63.0 in | Wt 172.4 lb

## 2022-07-18 DIAGNOSIS — R7303 Prediabetes: Secondary | ICD-10-CM | POA: Diagnosis not present

## 2022-07-18 DIAGNOSIS — E890 Postprocedural hypothyroidism: Secondary | ICD-10-CM

## 2022-07-18 DIAGNOSIS — E782 Mixed hyperlipidemia: Secondary | ICD-10-CM

## 2022-07-18 MED ORDER — ROSUVASTATIN CALCIUM 40 MG PO TABS: 40.0000 mg | ORAL_TABLET | Freq: Every day | ORAL | 1 refills | Status: DC

## 2022-07-18 NOTE — Patient Instructions (Signed)
                                     Advice for Weight Management  -For most of us the best way to lose weight is by diet management. Generally speaking, diet management means consuming less calories intentionally which over time brings about progressive weight loss.  This can be achieved more effectively by avoiding ultra processed carbohydrates, processed meats, unhealthy fats.    It is critically important to know your numbers: how much calorie you are consuming and how much calorie you need. More importantly, our carbohydrates sources should be unprocessed naturally occurring  complex starch food items.  It is always important to balance nutrition also by  appropriate intake of proteins (mainly plant-based), healthy fats/oils, plenty of fruits and vegetables.   -The American College of Lifestyle Medicine (ACL M) recommends nutrition derived mostly from Whole Food, Plant Predominant Sources example an apple instead of applesauce or apple pie. Eat Plenty of vegetables, Mushrooms, fruits, Legumes, Whole Grains, Nuts, seeds in lieu of processed meats, processed snacks/pastries red meat, poultry, eggs.  Use only water or unsweetened tea for hydration.  The College also recommends the need to stay away from risky substances including alcohol, smoking; obtaining 7-9 hours of restorative sleep, at least 150 minutes of moderate intensity exercise weekly, importance of healthy social connections, and being mindful of stress and seek help when it is overwhelming.    -Sticking to a routine mealtime to eat 3 meals a day and avoiding unnecessary snacks is shown to have a big role in weight control. Under normal circumstances, the only time we burn stored energy is when we are hungry, so allow  some hunger to take place- hunger means no food between appropriate meal times, only water.  It is not advisable to starve.   -It is better to avoid simple carbohydrates including:  Cakes, Sweet Desserts, Ice Cream, Soda (diet and regular), Sweet Tea, Candies, Chips, Cookies, Store Bought Juices, Alcohol in Excess of  1-2 drinks a day, Lemonade,  Artificial Sweeteners, Doughnuts, Coffee Creamers, "Sugar-free" Products, etc, etc.  This is not a complete list.....    -Consulting with certified diabetes educators is proven to provide you with the most accurate and current information on diet.  Also, you may be  interested in discussing diet options/exchanges , we can schedule a visit with Laura [name], RDN, CDE for individualized nutrition education.  -Exercise: If you are able: 30 -60 minutes a day ,4 days a week, or 150 minutes of moderate intensity exercise weekly.    The longer the better if tolerated.  Combine stretch, strength, and aerobic activities.  If you were told in the past that you have high risk for cardiovascular diseases, or if you are currently symptomatic, you may seek evaluation by your heart doctor prior to initiating moderate to intense exercise programs.                                  Additional Care Considerations for Diabetes/Prediabetes   -Diabetes  is a chronic disease.  The most important care consideration is regular follow-up with your diabetes care provider with the goal being avoiding or delaying its complications and to take advantage of advances in medications and technology.  If appropriate actions are taken early enough, type 2 diabetes can even be   reversed.  Seek information from the right source.  - Whole Food, Plant Predominant Nutrition is highly recommended: Eat Plenty of vegetables, Mushrooms, fruits, Legumes, Whole Grains, Nuts, seeds in lieu of processed meats, processed snacks/pastries red meat, poultry, eggs as recommended by American College of  Lifestyle Medicine (ACLM).  -Type 2 diabetes is known to coexist with other important comorbidities such as high blood pressure and high cholesterol.  It is critical to control not only the  diabetes but also the high blood pressure and high cholesterol to minimize and delay the risk of complications including coronary artery disease, stroke, amputations, blindness, etc.  The good news is that this diet recommendation for type 2 diabetes is also very helpful for managing high cholesterol and high blood blood pressure.  - Studies showed that people with diabetes will benefit from a class of medications known as ACE inhibitors and statins.  Unless there are specific reasons not to be on these medications, the standard of care is to consider getting one from these groups of medications at an optimal doses.  These medications are generally considered safe and proven to help protect the heart and the kidneys.    - People with diabetes are encouraged to initiate and maintain regular follow-up with eye doctors, foot doctors, dentists , and if necessary heart and kidney doctors.     - It is highly recommended that people with diabetes quit smoking or stay away from smoking, and get yearly  flu vaccine and pneumonia vaccine at least every 5 years.  See above for additional recommendations on exercise, sleep, stress management , and healthy social connections.      

## 2022-07-18 NOTE — Progress Notes (Signed)
07/18/2022  Endocrinology follow-up note  Complaint: Follow-up for hypothyroidism  HPI  Laura [name] is a 71 y.o.-year-old female,  She has  medical history of goiter status post left hemithyroidectomy 20+ years ago, unremarkable thyroid/neck ultrasound in 2012.  She is currently on levothyroxine 88 mcg p.o. daily before breakfast.  She is compliant on medication.  She has no new complaints today.  Her dyslipidemia is not controlled despite maximum dose of Lipitor at 80 mg p.o. nightly.     She has steady weight since last visit.   -  She denies any heat/cold intolerance, palpitations, tremors.    Pt denies feeling nodules in neck, hoarseness, dysphagia/odynophagia, SOB with lying down.  No history of radiation exposure to her head and neck.   No recent use of iodine supplements.   ROS: Constitutional: + steady weight, no fatigue, no subjective hyperthermia nor hypothermia.     PE: BP 138/78   Pulse 60   Ht 5' 3" (1.6 m)   Wt 172 lb 6.4 oz (78.2 kg)   BMI 30.54 kg/m  Wt Readings from Last 3 Encounters:  07/18/22 172 lb 6.4 oz (78.2 kg)  06/21/22 172 lb (78 kg)  06/07/22 172 lb (78 kg)    Physical Exam- Limited  Constitutional:  Body mass index is 30.54 kg/m. , not in acute distress, normal state of mind    Recent Results (from the past 2160 hour(s))  Basic Metabolic Panel (BMET)     Status: Abnormal   Collection Time: 04/22/22  9:45 AM  Result Value Ref Range   Sodium 137 135 - 145 mmol/L   Potassium 3.0 (L) 3.5 - 5.1 mmol/L   Chloride 99 98 - 111 mmol/L   CO2 29 22 - 32 mmol/L   Glucose, Bld 107 (H) 70 - 99 mg/dL    Comment: Glucose reference range applies only to samples taken after fasting for at least 8 hours.   BUN 8 8 - 23 mg/dL   Creatinine, Ser 0.92 0.44 - 1.00 mg/dL   Calcium 9.4 8.9 - 10.3 mg/dL   GFR, Estimated >60 >60 mL/min    Comment: (NOTE) Calculated using the CKD-EPI Creatinine Equation (2021)    Anion gap 9 5 - 15    Comment: Performed  at [facility], [address] [place], [place] [address]  [id number]     Status: Abnormal   Collection Time: 06/07/22  2:58 PM  Result Value Ref Range   Glucose 104 (H) 70 - 99 mg/dL   BUN 17 8 - 27 mg/dL   Creatinine, Ser 0.91 0.57 - 1.00 mg/dL   eGFR 67 >59 mL/min/1.73   BUN/Creatinine Ratio 19 12 - 28   Sodium 140 134 - 144 mmol/L   Potassium 3.9 3.5 - 5.2 mmol/L   Chloride 97 96 - 106 mmol/L   CO2 29 20 - 29 mmol/L   Calcium 9.8 8.7 - 10.3 mg/dL  Lipid panel     Status: Abnormal   Collection Time: 06/21/22  2:44 PM  Result Value Ref Range   Cholesterol, Total 249 (H) 100 - 199 mg/dL   Triglycerides 122 0 - 149 mg/dL   HDL 76 >39 mg/dL   VLDL Cholesterol Cal 21 5 - 40 mg/dL   LDL Chol Calc (NIH) 152 (H) 0 - 99 mg/dL   Chol/HDL Ratio 3.3 0.0 - 4.4 ratio    Comment:                                     T. Chol/HDL Ratio                                             Men  Women                               1/2 Avg.Risk  3.4    3.3                                   Avg.Risk  5.0    4.4                                2X Avg.Risk  9.6    7.1                                3X Avg.Risk 23.4   11.0   Hepatic function panel     Status: None   Collection Time: 06/21/22  2:44 PM  Result Value Ref Range   Total Protein 7.2 6.0 - 8.5 g/dL   Albumin 4.4 3.8 - 4.8 g/dL   Bilirubin Total 0.7 0.0 - 1.2 mg/dL   Bilirubin, Direct 0.15 0.00 - 0.40 mg/dL   Alkaline Phosphatase 87 44 - 121 IU/L   AST 18 0 - 40 IU/L   ALT 22 0 - 32 IU/L  TSH     Status: None   Collection Time: 07/11/22  9:13 AM  Result Value Ref Range   TSH 1.050 0.450 - 4.500 uIU/mL  T4, free     Status: None   Collection Time: 07/11/22  9:13 AM  Result Value Ref Range   Free T4 1.48 0.82 - 1.77 ng/dL   ASSESSMENT: 1. Hypothyroidism, postsurgical 2.  Hyperlipidemia  PLAN:  -Her previsit thyroid function tests are consistent with appropriate replacement.  She is advised to continue levothyroxine 88 mcg p.o. daily before  breakfast.    - We discussed about the correct intake of her thyroid hormone, on empty stomach at fasting, with water, separated by at least 30 minutes from breakfast and other medications,  and separated by more than 4 hours from calcium, iron, multivitamins, acid reflux medications (PPIs). -Patient is made aware of the fact that thyroid hormone replacement is needed for life, dose to be adjusted by periodic monitoring of thyroid function tests.  -Her surveillance thyroid/neck ultrasound was unremarkable in 2012. In light of her severe dyslipidemia despite maximum dose of Lipitor at 80 mg p.o.  qhs,  and class I obesity, she would benefit from lifestyle medicine. In discussing this with her Lipitor to Crestor 40 nitroglycerin.  Side effects and precautions discussed with her. She will also benefit whole food plant-based diet which is discussed and recommended to her.  Her recent POC  A1c is 5.4%, improving from prior measurements consistent with prediabetes. -Her screening bone density from October 2019 was normal.    She is advised to maintain close follow up with her PMD for Primary care needs.   I spent 30 minutes in the care of the patient today including review of labs from Thyroid Function, CMP, and other relevant labs ; imaging/biopsy records (current   and previous including abstractions from other facilities); face-to-face time discussing  her lab results and symptoms, medications doses, her options of short and long term treatment based on the latest standards of care / guidelines;   and documenting the encounter.  Laura [name]  participated in the discussions, expressed understanding, and voiced agreement with the above plans.  All questions were answered to her satisfaction. she is encouraged to contact clinic should she have any questions or concerns prior to her return visit.   Laura Lloyd, MD Phone: [phone]  Fax: [phone]   07/18/2022, 10:11 AM

## 2022-07-21 ENCOUNTER — Encounter: Payer: Self-pay | Admitting: Family Medicine

## 2022-07-28 NOTE — Telephone Encounter (Signed)
Patient aware information mailed

## 2022-08-01 ENCOUNTER — Telehealth: Payer: Self-pay

## 2022-08-01 NOTE — Telephone Encounter (Signed)
I don't see any recent lab results

## 2022-08-01 NOTE — Telephone Encounter (Signed)
Patient came by office said nurse can call her on home phone [phone] she hardly ever cuts her cell phone on.  If no answer, leave detailed message and she will call nurse back. Regarding lab results.

## 2022-08-10 ENCOUNTER — Encounter: Payer: Self-pay | Admitting: Family Medicine

## 2022-08-11 ENCOUNTER — Encounter: Payer: Self-pay | Admitting: Family Medicine

## 2022-08-12 ENCOUNTER — Telehealth: Payer: Self-pay

## 2022-08-12 ENCOUNTER — Ambulatory Visit (INDEPENDENT_AMBULATORY_CARE_PROVIDER_SITE_OTHER): Payer: Medicare Other | Admitting: Family Medicine

## 2022-08-12 ENCOUNTER — Encounter: Payer: Self-pay | Admitting: Family Medicine

## 2022-08-12 VITALS — BP 114/74 | HR 75 | Ht 63.0 in | Wt 165.0 lb

## 2022-08-12 DIAGNOSIS — I10 Essential (primary) hypertension: Secondary | ICD-10-CM

## 2022-08-12 DIAGNOSIS — F324 Major depressive disorder, single episode, in partial remission: Secondary | ICD-10-CM | POA: Diagnosis not present

## 2022-08-12 DIAGNOSIS — J3089 Other allergic rhinitis: Secondary | ICD-10-CM | POA: Diagnosis not present

## 2022-08-12 DIAGNOSIS — N644 Mastodynia: Secondary | ICD-10-CM | POA: Diagnosis not present

## 2022-08-12 MED ORDER — MONTELUKAST SODIUM 10 MG PO TABS: 10.0000 mg | ORAL_TABLET | Freq: Every day | ORAL | 3 refills | Status: DC

## 2022-08-12 MED ORDER — BUPROPION HCL ER (XL) 150 MG PO TB24: 150.0000 mg | ORAL_TABLET | Freq: Every day | ORAL | 2 refills | Status: DC

## 2022-08-12 NOTE — Patient Instructions (Addendum)
Follow-up to reevaluate depression and of November call if you need me sooner.  New additional medication Wellbutrin 150 mg daily  New medication for allergies Singulair 1 tablet once daily.  Nurse please arrange diagnostic mammogram for bilateral breast pain with right breast nodule.  Thanks for choosing [facility], we consider it a privelige to serve you.

## 2022-08-12 NOTE — Telephone Encounter (Signed)
FYI to let Dr Moshe Cipro know that Patient came into office and found lump in right breast not going until December for Mammogram.

## 2022-08-12 NOTE — Telephone Encounter (Signed)
Pt seen and evaluated in office

## 2022-08-12 NOTE — Assessment & Plan Note (Signed)
Uncontrolled , start daily singulair 

## 2022-08-12 NOTE — Assessment & Plan Note (Signed)
3 day h/o bilateral breast pain and right breast nodule at 4 O clock in areolar region, needs diagnostic mammogram

## 2022-08-12 NOTE — Assessment & Plan Note (Signed)
Uncontrolled add wellbutrin nd re eval

## 2022-08-14 ENCOUNTER — Encounter: Payer: Self-pay | Admitting: Family Medicine

## 2022-08-14 NOTE — Progress Notes (Signed)
   Laura [name]     MRN: [id number]      DOB: 03/15/1951   HPI Laura [name] is here fc/o bilateral breast pain  right greater than left x 2 days, als c/o right breast nodule which ist ender, no recent h/o breast trauma C/o anxiety and stress carin for a dependent Aunt with adult children who are not helping Depression screening is not significant, but anxiety is high , no interest in therapy currently , states she has support from a friend  ROS Denies recent fever or chills. Denies sinus pressure, nasal congestion, ear pain or sore throat. Denies chest congestion, productive cough or wheezing. Denies chest pains, palpitations and leg swelling Denies abdominal pain, nausea, vomiting,diarrhea or constipation.   Denies dysuria, frequency, hesitancy or incontinence. Denies joint pain, swelling and limitation in mobility. Denies headaches, seizures, numbness, or tingling.  Denies skin break down or rash.   PE  BP 114/74 (BP Location: Right Arm, Patient Position: Sitting)   Pulse 75   Ht 5\' 3"  (1.6 m)   Wt 165 lb (74.8 kg)   SpO2 92%   BMI 29.23 kg/m   Patient alert and oriented and in no cardiopulmonary distress.  HEENT: No facial asymmetry, EOMI,     Neck supple .  Chest: Clear to auscultation bilaterally. Breast: right breast tender nodule at 4 o clock in subareolar are, diffuse breast tenderness bilaterally CVS: S1, S2 no murmurs, no S3.Regular rate.  ABD: Soft non tender.   Ext: No edema  MS: Adequate ROM spine, shoulders, hips and knees.  Skin: Intact, no ulcerations or rash noted.  Psych: Good eye contact, normal affect. Memory intact not anxious or depressed appearing.  CNS: CN 2-12 intact, power,  normal throughout.no focal deficits noted.   Assessment & Plan  Breast pain in female 3 day h/o bilateral breast pain and right breast nodule at 4 O clock in areolar region, needs diagnostic mammogram  Allergic rhinitis Uncontrolled, start daily  singulair  Depression, major, single episode, in partial remission ([place]) Uncontrolled add wellbutrin and  re eval add wellbutrin daily  Essential hypertension Controlled, no change in medication DASH diet and commitment to daily physical activity for a minimum of 30 minutes discussed and encouraged, as a part of hypertension management. The importance of attaining a healthy weight is also discussed.     08/12/2022   10:50 AM 07/18/2022    9:38 AM 06/21/2022    2:21 PM 06/21/2022    1:39 PM 06/07/2022    2:38 PM 06/07/2022    2:01 PM 05/26/2022    8:38 AM  BP/Weight  Systolic BP 597 416 384 536 468 032 122  Diastolic BP 74 78 70 80 80 84 82  Wt. (Lbs) 165 172.4  172  172   BMI 29.23 kg/m2 30.54 kg/m2  30.47 kg/m2  30.47 kg/m2

## 2022-08-14 NOTE — Assessment & Plan Note (Signed)
Controlled, no change in medication DASH diet and commitment to daily physical activity for a minimum of 30 minutes discussed and encouraged, as a part of hypertension management. The importance of attaining a healthy weight is also discussed.     08/12/2022   10:50 AM 07/18/2022    9:38 AM 06/21/2022    2:21 PM 06/21/2022    1:39 PM 06/07/2022    2:38 PM 06/07/2022    2:01 PM 05/26/2022    8:38 AM  BP/Weight  Systolic BP 505 183 358 251 898 421 031  Diastolic BP 74 78 70 80 80 84 82  Wt. (Lbs) 165 172.4  172  172   BMI 29.23 kg/m2 30.54 kg/m2  30.47 kg/m2  30.47 kg/m2

## 2022-08-22 ENCOUNTER — Other Ambulatory Visit: Payer: Self-pay | Admitting: Family Medicine

## 2022-08-23 ENCOUNTER — Other Ambulatory Visit: Payer: Self-pay | Admitting: Family Medicine

## 2022-08-23 DIAGNOSIS — M25561 Pain in right knee: Secondary | ICD-10-CM | POA: Diagnosis not present

## 2022-08-23 DIAGNOSIS — M25461 Effusion, right knee: Secondary | ICD-10-CM | POA: Diagnosis not present

## 2022-08-30 ENCOUNTER — Other Ambulatory Visit (HOSPITAL_COMMUNITY): Payer: Self-pay | Admitting: Family Medicine

## 2022-08-30 ENCOUNTER — Other Ambulatory Visit: Payer: Self-pay | Admitting: "Endocrinology

## 2022-08-30 DIAGNOSIS — E890 Postprocedural hypothyroidism: Secondary | ICD-10-CM

## 2022-08-30 DIAGNOSIS — N644 Mastodynia: Secondary | ICD-10-CM

## 2022-09-02 ENCOUNTER — Other Ambulatory Visit: Payer: Self-pay

## 2022-09-02 MED ORDER — OMEPRAZOLE 20 MG PO CPDR: DELAYED_RELEASE_CAPSULE | ORAL | 3 refills | Status: DC

## 2022-09-04 ENCOUNTER — Other Ambulatory Visit: Payer: Self-pay | Admitting: Nurse Practitioner

## 2022-09-05 ENCOUNTER — Encounter: Payer: Self-pay | Admitting: Internal Medicine

## 2022-09-05 ENCOUNTER — Ambulatory Visit (INDEPENDENT_AMBULATORY_CARE_PROVIDER_SITE_OTHER): Payer: Medicare Other | Admitting: Internal Medicine

## 2022-09-05 VITALS — BP 113/62 | HR 64 | Resp 16 | Ht 64.0 in | Wt 167.0 lb

## 2022-09-05 DIAGNOSIS — Z0000 Encounter for general adult medical examination without abnormal findings: Secondary | ICD-10-CM | POA: Diagnosis not present

## 2022-09-05 NOTE — Progress Notes (Signed)
Subjective:   Laura [name] is a 71 y.o. female who presents for Medicare Annual (Subsequent) preventive examination.  Review of Systems    Review of Systems  All other systems reviewed and are negative.   Objective:    Today's Vitals   09/05/22 1548 09/05/22 1552  BP: 113/62   Pulse: 64   Resp: 16   SpO2: 97%   Weight: 167 lb (75.8 kg)   Height: 5\' 4"  (1.626 m)   PainSc:  10-Worst pain ever   Body mass index is 28.67 kg/m.     09/05/2022    3:55 PM 12/07/2021   10:04 AM 08/30/2021    8:56 AM 07/31/2021    7:34 AM 07/20/2021    7:49 AM 03/15/2021    8:36 AM 04/21/2020    8:12 AM  Advanced Directives  Does Patient Have a Medical Advance Directive? Yes No No No No No No  Does patient want to make changes to medical advance directive? Yes (MAU/Ambulatory/Procedural Areas - Information given)        Would patient like information on creating a medical advance directive?  Yes (MAU/Ambulatory/Procedural Areas - Information given) Yes (MAU/Ambulatory/Procedural Areas - Information given)  Yes (MAU/Ambulatory/Procedural Areas - Information given) Yes (MAU/Ambulatory/Procedural Areas - Information given)     Current Medications (verified) Outpatient Encounter Medications as of 09/05/2022  Medication Sig   acetaminophen (TYLENOL) 500 MG tablet Take 1 tablet (500 mg total) by mouth every 6 (six) hours as needed. (Patient taking differently: Take 1,500 mg by mouth daily as needed for mild pain or headache.)   amLODipine (NORVASC) 2.5 MG tablet TAKE 1 TABLET BY MOUTH DAILY   amLODipine (NORVASC) 5 MG tablet Take 1 tablet (5 mg total) by mouth daily.   buPROPion (WELLBUTRIN XL) 150 MG 24 hr tablet Take 1 tablet (150 mg total) by mouth daily.   Calcium Carbonate-Vit D-Min (CALCIUM 1200) 1200-1000 MG-UNIT CHEW Take one tablet by mouth two times daily   citalopram (CELEXA) 40 MG tablet TAKE 1 TABLET BY MOUTH  DAILY   famotidine (PEPCID) 40 MG tablet Take 1 tablet (40 mg total) by mouth  daily. As needed for heartburn/abdominal pain   fenofibrate (TRICOR) 48 MG tablet TAKE 1 TABLET BY MOUTH DAILY   furosemide (LASIX) 20 MG tablet TAKE 1 TABLET(20 MG) BY MOUTH DAILY   gabapentin (NEURONTIN) 100 MG capsule TAKE 1 CAPSULE BY MOUTH AT  BEDTIME   hydrochlorothiazide (HYDRODIURIL) 25 MG tablet TAKE 1 TABLET BY MOUTH  DAILY   Lactobacillus Rhamnosus, GG, (CULTURELLE PO) Take by mouth daily.   levothyroxine (SYNTHROID) 88 MCG tablet TAKE 1 TABLET BY MOUTH  DAILY BEFORE BREAKFAST   montelukast (SINGULAIR) 10 MG tablet Take 1 tablet (10 mg total) by mouth at bedtime.   Multiple Vitamin (MULTIVITAMIN WITH MINERALS) TABS tablet Take 1 tablet by mouth daily.   omeprazole (PRILOSEC) 20 MG capsule TAKE 1 CAPSULE(20 MG) BY MOUTH TWICE DAILY BEFORE A MEAL   Polyethyl Glycol-Propyl Glycol (SYSTANE OP) Place 1 drop into both eyes daily as needed (dry eyes).   potassium chloride SA (KLOR-CON M) 20 MEQ tablet Take two tablets three times daily for 5 days , then take two tablets twice daily   rosuvastatin (CRESTOR) 40 MG tablet Take 1 tablet (40 mg total) by mouth daily.   traMADol (ULTRAM) 50 MG tablet Take 50 mg by mouth every 8 (eight) hours as needed for moderate pain.   [DISCONTINUED] sucralfate (CARAFATE) 1 g tablet Take 1 tablet (1  g total) by mouth 3 (three) times daily as needed. May dissolve 1 tablet into a glass of water and drink as needed   No facility-administered encounter medications on file as of 09/05/2022.    Allergies (verified) Cozaar [losartan potassium], Dilaudid [hydromorphone hcl], Levaquin [levofloxacin], Nsaids, and Promethazine   History: Past Medical History:  Diagnosis Date   Anxiety    At low risk for fall 08/15/2018   Cystitis    Depression    Gastritis    GERD (gastroesophageal reflux disease)    Hashimoto's thyroiditis    Hx    Hyperlipidemia    Hypertension    Hypothyroidism    Normocytic anemia 2009 Hb 9.9-11.1   Osteopenia    Shoulder pain     Urinary incontinence    Past Surgical History:  Procedure Laterality Date   Bilateral tubal ligation     BIOPSY  04/21/2020   Procedure: BIOPSY;  Surgeon: Corbin Ade, MD;  Location: [facility];  Service: Endoscopy;;   BREAST EXCISIONAL BIOPSY  2010   Excisional biopsy of benign left breast mass -lopoma    BREAST SURGERY     left nreast biopsy   COLONOSCOPY  08/26/2011   SLF: 1. Internal hemorrhoids   COLONOSCOPY N/A 10/06/2015   KWI:OXBD fissure or internal hemorrhoids/mild sigmoid colitis. benign colonic path    COLONOSCOPY WITH PROPOFOL N/A 03/15/2021   Procedure: COLONOSCOPY WITH PROPOFOL;  Surgeon: [name], DO;  Location: [facility];  Service: Endoscopy;  Laterality: N/A;  3:00pm   colonscopy  2005   Dr. Katrinka Blazing   ESOPHAGOGASTRODUODENOSCOPY  12/2009   chronic gastritis   ESOPHAGOGASTRODUODENOSCOPY N/A 05/17/2017   Dr. Darrick Penna; gastritis, normal small bowel biopsies, multiple gastric polyps. fundic gland polyps   ESOPHAGOGASTRODUODENOSCOPY (EGD) WITH PROPOFOL N/A 04/21/2020   Rourk: multiple gastric polyps, biopsy of one c/w fundic gland. gastric biopsy showed mild chronic gastritis but no H.pylori   GIVENS CAPSULE STUDY N/A 05/30/2017   gastritis, no source for anemia identified   left knee surgery Left March 1,2017   PARTIAL KNEE ARTHROPLASTY Left 08/09/2017   Procedure: LEFT UNICOMPARTMENTAL KNEE;  Surgeon: Ollen Gross, MD;  Location: [facility];  Service: Orthopedics;  Laterality: Left;   POLYPECTOMY  04/21/2020   Procedure: POLYPECTOMY;  Surgeon: Corbin Ade, MD;  Location: [facility];  Service: Endoscopy;;  gastric   Resection of left lobe of thyroid     right carpal tunnel release     rt. knee athroscopy  2004   SAVORY DILATION N/A 05/17/2017   Procedure: SAVORY DILATION;  Surgeon: West Bali, MD;  Location: [facility];  Service: Endoscopy;  Laterality: N/A;   TOTAL ABDOMINAL HYSTERECTOMY  1994   UMBILICAL HERNIA REPAIR     Urethral dilation for  stenosis  2009   Family History  Problem Relation Age of Onset   Pneumonia Mother    Healthy Father    Colon cancer Neg Hx    Anesthesia problems Neg Hx    Hypotension Neg Hx    Malignant hyperthermia Neg Hx    Pseudochol deficiency Neg Hx    Social History   Socioeconomic History   Marital status: Married    Spouse name: Not on file   Number of children: 3   Years of education: Not on file   Highest education level: Not on file  Occupational History   Occupation: Designer, 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   Tobacco Use   Smoking status: Former  Packs/day: 0.25    Years: 1.00    Total pack years: 0.25    Types: Cigarettes    Quit date: 08/25/1972    Years since quitting: 50.0    Passive exposure: Past   Smokeless tobacco: Never  Vaping Use   Vaping Use: Never used  Substance and Sexual Activity   Alcohol use: No   Drug use: No   Sexual activity: Not Currently    Birth control/protection: Surgical    Comment: hyst  Other Topics Concern   Not on file  Social History Narrative   Not on file   Social Determinants of Health   Financial Resource Strain: Low Risk  (08/30/2021)   Overall Financial Resource Strain (CARDIA)    Difficulty of Paying Living Expenses: Not hard at all  Food Insecurity: No Food Insecurity (08/30/2021)   Hunger Vital Sign    Worried About Running Out of Food in the Last Year: Never true    Ran Out of Food in the Last Year: Never true  Transportation Needs: No Transportation Needs (08/30/2021)   PRAPARE - Hydrologist (Medical): No    Lack of Transportation (Non-Medical): No  Physical Activity: Inactive (08/30/2021)   Exercise Vital Sign    Days of Exercise per Week: 0 days    Minutes of Exercise per Session: 0 min  Stress: No Stress Concern Present (08/30/2021)   [place]    Feeling of Stress : Not at all  Social Connections: Moderately  Integrated (08/30/2021)   Social Connection and Isolation Panel [NHANES]    Frequency of Communication with Friends and Family: More than three times a week    Frequency of Social Gatherings with Friends and Family: Twice a week    Attends Religious Services: More than 4 times per year    Active Member of [employer] or Organizations: No    Attends Archivist Meetings: Never    Marital Status: Married    Tobacco Counseling Counseling given: Not Answered   Clinical Intake:  Pre-visit preparation completed: Yes  Pain : No/denies pain Pain Score: 10-Worst pain ever     Nutritional Status: BMI 25 -29 Overweight Diabetes: No  How often do you need to have someone help you when you read instructions, pamphlets, or other written materials from your doctor or pharmacy?: 1 - Never What is the last grade level you completed in school?: [place] [employer] Daily Living     No data to display          Patient Care Team: Fayrene Helper, MD as PCP - General Fields, Marga Melnick, MD (Inactive) (Gastroenterology) Herminio Commons, MD (Inactive) as Attending Physician (Cardiology) Netta Cedars, MD as Consulting Physician (Orthopedic Surgery) Cassandria Anger, MD as Consulting Physician (Endocrinology) Gaynelle Arabian, MD as Consulting Physician (Orthopedic Surgery) Eloise Harman, DO as Consulting Physician (Internal Medicine)  Indicate any recent Medical Services you may have received from other than [facility] providers in the past year (date may be approximate).     Assessment:   This is a routine wellness examination for Laura [name].  Hearing/Vision screen No results found.  Dietary issues and exercise activities discussed:     Goals Addressed   None    Depression Screen    09/05/2022    3:57 PM 08/12/2022   11:31 AM 08/12/2022   10:51 AM 06/21/2022    1:40 PM 04/22/2022  8:56 AM 01/05/2022    8:10 AM 12/16/2021    3:48 PM  PHQ 2/9 Scores   PHQ - 2 Score 0 1 0 0 0 0 0  PHQ- 9 Score  5         Fall Risk    09/05/2022    3:56 PM 09/05/2022    3:49 PM 08/12/2022   10:51 AM 06/21/2022    1:40 PM 06/07/2022    2:03 PM  Fall Risk   Falls in the past year? 0 0 0 0 0  Number falls in past yr: 0 0 0 0 0  Injury with Fall? 0 0 0 0 0  Risk for fall due to :   No Fall Risks No Fall Risks   Follow up    Falls evaluation completed     FALL RISK PREVENTION PERTAINING TO THE HOME:  Any stairs in or around the home? Yes  If so, are there any without handrails? Yes  Home free of loose throw rugs in walkways, pet beds, electrical cords, etc? Yes  Adequate lighting in your home to reduce risk of falls? Yes   ASSISTIVE DEVICES UTILIZED TO PREVENT FALLS:  Life alert? No  Use of a cane, walker or w/c? No  Grab bars in the bathroom? No  Shower chair or bench in shower? Yes  Elevated toilet seat or a handicapped toilet? Yes    Cognitive Function:    08/30/2021    8:57 AM  MMSE - Mini Mental State Exam  Not completed: Unable to complete        09/05/2022    4:01 PM 08/30/2021    8:58 AM 08/07/2020   10:35 AM 08/07/2019    1:29 PM 07/30/2018    2:22 PM  6CIT Screen  What Year? 0 points 0 points 0 points 0 points 0 points  What month? 0 points 0 points 0 points 0 points 0 points  What time? 0 points 0 points 0 points 0 points 0 points  Count back from 20 0 points 0 points 0 points 0 points 0 points  Months in reverse 0 points 0 points 0 points 0 points 0 points  Repeat phrase 2 points 0 points 0 points 0 points 0 points  Total Score 2 points 0 points 0 points 0 points 0 points    Immunizations Immunization History  Administered Date(s) Administered   Fluad Quad(high Dose 65+) 06/20/2019, 08/05/2020, 06/30/2021, 07/13/2022   Influenza Split 07/17/2012   Influenza Whole 10/08/2003, 07/01/2010, 07/14/2011   Influenza,inj,Quad PF,6+ Mos 08/14/2013, 08/28/2014, 09/03/2015, 08/10/2016, 06/21/2017, 06/26/2018    Influenza-Unspecified 09/14/2017   Moderna SARS-COV2 Booster Vaccination 04/05/2021, 08/17/2021   Moderna Sars-Covid-2 Vaccination 12/15/2019, 01/12/2020, 08/14/2020   Pneumococcal Conjugate-13 06/05/2014   Pneumococcal Polysaccharide-23 11/20/2015   Td 04/21/2004   Tdap 06/05/2014   Zoster Recombinat (Shingrix) 04/22/2021   Zoster, Live 03/31/2011    TDAP status: Up to date  Flu Vaccine status: Up to date  Pneumococcal vaccine status: Up to date  Covid-19 vaccine status: Completed vaccines Needs to bring card to update  Qualifies for Shingles Vaccine? Yes   Zostavax completed No   Shingrix Completed?: Yes  Screening Tests Health Maintenance  Topic Date Due   COVID-19 Vaccine (4 - Moderna series) 10/12/2021   Medicare Annual Wellness (AWV)  08/30/2022   Zoster Vaccines- Shingrix (2 of 2) 07/27/2023 (Originally 06/17/2021)   MAMMOGRAM  10/19/2023   TETANUS/TDAP  06/05/2024   COLONOSCOPY (Pts 45-90yrs Insurance coverage will need to  be confirmed)  03/16/2031   Pneumonia Vaccine 90+ Years old  Completed   INFLUENZA VACCINE  Completed   DEXA SCAN  Completed   Hepatitis C Screening  Completed   HPV VACCINES  Aged Out    Health Maintenance  Health Maintenance Due  Topic Date Due   COVID-19 Vaccine (4 - Moderna series) 10/12/2021   Medicare Annual Wellness (AWV)  08/30/2022    Colorectal cancer screening: Type of screening: Colonoscopy. Completed 03/15/2022. Repeat every 10 years  Mammogram status: Ordered 10/25/2022. Pt provided with contact info and advised to call to schedule appt.   Bone Density status: Completed 08/08/2018. Results reflect: Bone density results: NORMAL.   Lung Cancer Screening: (Low Dose CT Chest recommended if Age 28-80 years, 30 pack-year currently smoking OR have quit w/in 15years.) does not qualify.    Additional Screening:  Hepatitis C Screening: does not qualify; Completed 04/12/2016  Vision Screening: Recommended annual ophthalmology exams  for early detection of glaucoma and other disorders of the eye. Is the patient up to date with their annual eye exam?  Yes  Who is the provider or what is the name of the office in which the patient attends annual eye exams? [facility] If pt is not established with a provider, would they like to be referred to a provider to establish care? No .   Dental Screening: Recommended annual dental exams for proper oral hygiene  Community Resource Referral / Chronic Care Management: CRR required this visit?  No   CCM required this visit?  No      Plan:     I have personally reviewed and noted the following in the patient's chart:   Medical and social history Use of alcohol, tobacco or illicit drugs  Current medications and supplements including opioid prescriptions. Patient is not currently taking opioid prescriptions. Functional ability and status Nutritional status Physical activity Advanced directives List of other physicians Hospitalizations, surgeries, and ER visits in previous 12 months Vitals Screenings to include cognitive, depression, and falls Referrals and appointments  In addition, I have reviewed and discussed with patient certain preventive protocols, quality metrics, and best practice recommendations. A written personalized care plan for preventive services as well as general preventive health recommendations were provided to patient.     Milus Banister, MD   09/05/2022

## 2022-09-05 NOTE — Patient Instructions (Signed)
  Ms. Savin , Thank you for taking time to come for your Medicare Wellness Visit. I appreciate your ongoing commitment to your health goals. Please review the following plan we discussed and let me know if I can assist you in the future.   These are the goals we discussed: Follow up for Knee pain and continue being active.   This is a list of the screening recommended for you and due dates:  Health Maintenance  Topic Date Due   COVID-19 Vaccine (4 - Moderna series) 10/12/2021   Medicare Annual Wellness Visit  08/30/2022   Zoster (Shingles) Vaccine (2 of 2) 07/27/2023*   Mammogram  10/19/2023   Tetanus Vaccine  06/05/2024   Colon Cancer Screening  03/16/2031   Pneumonia Vaccine  Completed   Flu Shot  Completed   DEXA scan (bone density measurement)  Completed   Hepatitis C Screening: USPSTF Recommendation to screen - Ages 57-79 yo.  Completed   HPV Vaccine  Aged Out  *Topic was postponed. The date shown is not the original due date.

## 2022-09-07 ENCOUNTER — Encounter: Payer: Self-pay | Admitting: Internal Medicine

## 2022-09-07 ENCOUNTER — Ambulatory Visit (INDEPENDENT_AMBULATORY_CARE_PROVIDER_SITE_OTHER): Payer: Medicare Other | Admitting: Internal Medicine

## 2022-09-07 VITALS — BP 129/74 | Resp 16 | Ht 64.0 in | Wt 166.8 lb

## 2022-09-07 DIAGNOSIS — M1711 Unilateral primary osteoarthritis, right knee: Secondary | ICD-10-CM | POA: Diagnosis not present

## 2022-09-07 DIAGNOSIS — G8929 Other chronic pain: Secondary | ICD-10-CM | POA: Diagnosis not present

## 2022-09-07 DIAGNOSIS — M25561 Pain in right knee: Secondary | ICD-10-CM

## 2022-09-07 MED ORDER — TRIAMCINOLONE ACETONIDE 40 MG/ML IJ SUSP
40.0000 mg | Freq: Once | INTRAMUSCULAR | Status: AC
  Administered 2022-09-07: 40 mg via INTRA_ARTICULAR

## 2022-09-07 NOTE — Progress Notes (Signed)
     CC: knee injection  (Right knee pain and swelling )    HPI:[name] is a 71 y.o. female who presents for evaluation of right knee pain and swelling. For the details of today's visit, please refer to the assessment and plan.   Past Medical History:  Diagnosis Date   Anxiety    At low risk for fall 08/15/2018   Cystitis    Depression    Gastritis    GERD (gastroesophageal reflux disease)    Hashimoto's thyroiditis    Hx    Hyperlipidemia    Hypertension    Hypothyroidism    Normocytic anemia 2009 Hb 9.9-11.1   Osteopenia    Shoulder pain    Urinary incontinence      Physical Exam: Vitals:   09/07/22 1348  BP: 129/74  Resp: 16  SpO2: 98%  Weight: 166 lb 12.8 oz (75.7 kg)  Height: 5\' 4"  (1.626 m)   Left Knee with surgical scar Right Knee: - Inspection: No gross deformity. Mild swelling,No erythema or bruising. Skin intact - Palpation: no TTP - ROM: full active ROM with flexion and extension in knee - Strength: 5/5 strength - Neuro/vasc: NV intact - Special Tests: - LIGAMENTS: negative anterior/posterior drawer, negative Lachman's, no MCL or LCL laxity  -- MENISCUS: negative McMurray's, negative Thessaly  -- PF JOINT: nml patellar mobility without apprehension. Negative patellar grind  Hips: normal, pain free passive ROM with IR/ER    Assessment & Plan:   OA (osteoarthritis) of knee Patient presents with right knee pain and has history of osteoarthritis of right knee. No changes in chronic knee pain. Has a CD with xray of right knee. Will try to have uploaded with radiology department. She has had steroid injection previously which she found benefit from for over a year.  Assessment/Plan: Osteoarthritis of right knee. Steroid injection of right knee today .  Injection Procedure Note Laura [name] [birth year] 09/07/2022  Procedure: Large Joint Injection of Knee Indications: Pain  Procedure Details Patient verbally consented to procedure.  Risks, benefits, and alternatives explained. Sterilely prepped with Chloraprep. Ethyl cholride used for anesthesia. 4 cc Lidocaine 1% mixed with 1 mL of Kenalog 40 mg injected using the anterolateral approach without difficulty. No complications with procedure and tolerated well. Patient had decreased pain post-injection.    13/05/2022, MD

## 2022-09-09 DIAGNOSIS — M25561 Pain in right knee: Secondary | ICD-10-CM | POA: Insufficient documentation

## 2022-09-09 DIAGNOSIS — G8929 Other chronic pain: Secondary | ICD-10-CM | POA: Insufficient documentation

## 2022-09-09 NOTE — Patient Instructions (Addendum)
Thank you for trusting me with your care. To recap, today we discussed the following:   Chronic pain of right knee, osteoarthritis  - steroid injection of right knee - I will ask radiology department to upload outside xray of knee

## 2022-09-09 NOTE — Assessment & Plan Note (Signed)
Patient presents with right knee pain and has history of osteoarthritis of right knee. No changes in chronic knee pain. Has a CD with xray of right knee. Will try to have uploaded with radiology department. She has had steroid injection previously which she found benefit from for over a year.  Assessment/Plan: Osteoarthritis of right knee. Steroid injection of right knee today .  Injection Procedure Note Laura [name] 24-Apr-1951 09/07/2022  Procedure: Large Joint Injection of Knee Indications: Pain  Procedure Details Patient verbally consented to procedure. Risks, benefits, and alternatives explained. Sterilely prepped with Chloraprep. Ethyl cholride used for anesthesia. 4 cc Lidocaine 1% mixed with 1 mL of Kenalog 40 mg injected using the anterolateral approach without difficulty. No complications with procedure and tolerated well. Patient had decreased pain post-injection.

## 2022-09-26 ENCOUNTER — Telehealth: Payer: Self-pay | Admitting: Family Medicine

## 2022-09-26 ENCOUNTER — Other Ambulatory Visit: Payer: Self-pay

## 2022-09-26 MED ORDER — CITALOPRAM HYDROBROMIDE 40 MG PO TABS: 40.0000 mg | ORAL_TABLET | Freq: Every day | ORAL | 0 refills | Status: DC

## 2022-09-26 NOTE — Telephone Encounter (Signed)
Patient came by office in regard to citalopram (CELEXA) 40 MG tablet   Main in order will not be here in time patient has taken last pill.  Wants to see if provider will send in about 20 pills until mail in comes.   [facility] on [address]   Patient wants a call when sent

## 2022-09-26 NOTE — Telephone Encounter (Signed)
Short supply sent to [facility]  °

## 2022-10-04 ENCOUNTER — Other Ambulatory Visit (HOSPITAL_COMMUNITY)
Admission: RE | Admit: 2022-10-04 | Discharge: 2022-10-04 | Disposition: A | Discharge status: Home / Self Care | Payer: Medicare Other | Source: Ambulatory Visit | Attending: "Endocrinology | Admitting: "Endocrinology

## 2022-10-04 DIAGNOSIS — E782 Mixed hyperlipidemia: Secondary | ICD-10-CM | POA: Insufficient documentation

## 2022-10-04 DIAGNOSIS — E890 Postprocedural hypothyroidism: Secondary | ICD-10-CM | POA: Diagnosis not present

## 2022-10-04 LAB — LIPID PANEL
Cholesterol: 156 mg/dL (ref 0–200)
HDL: 55 mg/dL (ref >40)
LDL Cholesterol: 86 mg/dL (ref 0–99)
Total CHOL/HDL Ratio: 2.8 RATIO
Triglycerides: 75 mg/dL (ref <150)
VLDL: 15 mg/dL (ref 0–40)

## 2022-10-04 LAB — TSH: TSH: 1.289 u[IU]/mL (ref 0.350–4.500)

## 2022-10-04 LAB — T4, FREE: Free T4: 0.95 ng/dL (ref 0.61–1.12)

## 2022-10-06 DIAGNOSIS — M25561 Pain in right knee: Secondary | ICD-10-CM | POA: Diagnosis not present

## 2022-10-06 DIAGNOSIS — M1711 Unilateral primary osteoarthritis, right knee: Secondary | ICD-10-CM | POA: Diagnosis not present

## 2022-10-06 DIAGNOSIS — M25562 Pain in left knee: Secondary | ICD-10-CM | POA: Diagnosis not present

## 2022-10-12 ENCOUNTER — Other Ambulatory Visit: Payer: Self-pay

## 2022-10-12 ENCOUNTER — Telehealth: Payer: Self-pay | Admitting: Family Medicine

## 2022-10-12 MED ORDER — CITALOPRAM HYDROBROMIDE 40 MG PO TABS: 40.0000 mg | ORAL_TABLET | Freq: Every day | ORAL | 0 refills | Status: DC

## 2022-10-12 NOTE — Telephone Encounter (Signed)
Refill sent to [facility].  

## 2022-10-12 NOTE — Telephone Encounter (Signed)
Patient needs refill on citalopram (CELEXA) 40 MG tablet    Patient is requesting a small amount be sent in to   [id number] - [place], [place] - [address], [place] [address] Phone: [phone]  Fax: [phone]  Until [facility] refill comes. Patient only has one pill left.

## 2022-10-17 ENCOUNTER — Ambulatory Visit (INDEPENDENT_AMBULATORY_CARE_PROVIDER_SITE_OTHER): Payer: Medicare Other | Admitting: "Endocrinology

## 2022-10-17 ENCOUNTER — Encounter: Payer: Self-pay | Admitting: "Endocrinology

## 2022-10-17 VITALS — BP 116/64 | HR 60 | Ht 64.0 in | Wt 164.4 lb

## 2022-10-17 DIAGNOSIS — E782 Mixed hyperlipidemia: Secondary | ICD-10-CM

## 2022-10-17 DIAGNOSIS — E890 Postprocedural hypothyroidism: Secondary | ICD-10-CM

## 2022-10-17 DIAGNOSIS — R7303 Prediabetes: Secondary | ICD-10-CM

## 2022-10-17 NOTE — Progress Notes (Signed)
10/17/2022  Endocrinology follow-up note  Complaint: Follow-up for hypothyroidism  HPI  Laura [name] is a 71 y.o.-year-old female,  She has  medical history of goiter status post left hemithyroidectomy 20+ years ago, unremarkable thyroid/neck ultrasound in 2012.  She is currently on levothyroxine 88 mcg p.o. daily before breakfast.   She is compliant on medication.  She has no new complaints today.  Her lipid panel showed significant improvement, currently taking her Lipitor at 80 mg p.o. nightly.   -She presents with some weight loss.  -  She denies any heat/cold intolerance, palpitations, tremors.    Pt denies feeling nodules in neck, hoarseness, dysphagia/odynophagia, SOB with lying down.  No history of radiation exposure to her head and neck.   No recent use of iodine supplements.   ROS: Constitutional: + steady weight, no fatigue, no subjective hyperthermia nor hypothermia.     PE: BP 116/64   Pulse 60   Ht 5\' 4"  (1.626 m)   Wt 164 lb 6.4 oz (74.6 kg)   BMI 28.22 kg/m  Wt Readings from Last 3 Encounters:  10/17/22 164 lb 6.4 oz (74.6 kg)  09/07/22 166 lb 12.8 oz (75.7 kg)  09/05/22 167 lb (75.8 kg)    Physical Exam- Limited  Constitutional:  Body mass index is 28.22 kg/m. , not in acute distress, normal state of mind    Recent Results (from the past 2160 hour(s))  Lipid panel     Status: None   Collection Time: 10/04/22 10:14 AM  Result Value Ref Range   Cholesterol 156 0 - 200 mg/dL   Triglycerides 75 14/05/23 mg/dL   HDL 55 <101 mg/dL   Total CHOL/HDL Ratio 2.8 RATIO   VLDL 15 0 - 40 mg/dL   LDL Cholesterol 86 0 - 99 mg/dL    Comment:        Total Cholesterol/HDL:CHD Risk Coronary Heart Disease Risk Table                     Men   Women  1/2 Average Risk   3.4   3.3  Average Risk       5.0   4.4  2 X Average Risk   9.6   7.1  3 X Average Risk  23.4   11.0        Use the calculated Patient Ratio above and the CHD Risk Table to determine the patient's  CHD Risk.        ATP III CLASSIFICATION (LDL):  <100     mg/dL   Optimal  >75  mg/dL   Near or Above                    Optimal  130-159  mg/dL   Borderline  102-585  mg/dL   High  277-824     mg/dL   Very High Performed at [facility], [address], [place], [place]   TSH     Status: None   Collection Time: 10/04/22 10:15 AM  Result Value Ref Range   TSH 1.289 0.350 - 4.500 uIU/mL    Comment: Performed by a 3rd Generation assay with a functional sensitivity of <=0.01 uIU/mL. Performed at [facility], [address], [place], [place]   T4, free     Status: None   Collection Time: 10/04/22 10:15 AM  Result Value Ref Range   Free T4 0.95 0.61 - 1.12 ng/dL    Comment: (NOTE)  Biotin ingestion may interfere with free T4 tests. If the results are inconsistent with the TSH level, previous test results, or the clinical presentation, then consider biotin interference. If needed, order repeat testing after stopping biotin. Performed at [facility], [place] [address] [place], [place] [address]    ASSESSMENT: 1. Hypothyroidism, postsurgical 2.  Hyperlipidemia  PLAN:  -Her previsit thyroid function tests are consistent with appropriate replacement.  She is advised to continue levothyroxine 88 mcg p.o. daily before breakfast.    - We discussed about the correct intake of her thyroid hormone, on empty stomach at fasting, with water, separated by at least 30 minutes from breakfast and other medications,  and separated by more than 4 hours from calcium, iron, multivitamins, acid reflux medications (PPIs). -Patient is made aware of the fact that thyroid hormone replacement is needed for life, dose to be adjusted by periodic monitoring of thyroid function tests.   -Her surveillance thyroid/neck ultrasound was unremarkable in 2012. During her last visit, her Lipitor was switched to Crestor 40 mg p.o. nightly.  This gave her dramatic improvement in her LDL to 86,  advised to continue.  Side effects and precautions discussed with her.  She will also benefit whole food plant-based diet which is discussed and recommended to her.  Her recent POC  A1c is 5.4%, improving from prior measurements consistent with prediabetes. -Her screening bone density from October 2019 was normal.    She is advised to maintain close follow up with her PMD for Primary care needs.    I spent 26 minutes in the care of the patient today including review of labs from Thyroid Function, CMP, and other relevant labs ; imaging/biopsy records (current and previous including abstractions from other facilities); face-to-face time discussing  her lab results and symptoms, medications doses, her options of short and long term treatment based on the latest standards of care / guidelines;   and documenting the encounter. Risk reduction counseling performed per USPSTF guidelines to reduce obesity and cardiovascular risk factors.    Rolley Sims  participated in the discussions, expressed understanding, and voiced agreement with the above plans.  All questions were answered to her satisfaction. she is encouraged to contact clinic should she have any questions or concerns prior to her return visit.   Glade Lloyd, MD Phone: [phone]  Fax: [phone]   10/17/2022, 6:12 PM

## 2022-10-17 NOTE — Patient Instructions (Signed)
                                     Advice for Weight Management  -For most of us the best way to lose weight is by diet management. Generally speaking, diet management means consuming less calories intentionally which over time brings about progressive weight loss.  This can be achieved more effectively by avoiding ultra processed carbohydrates, processed meats, unhealthy fats.    It is critically important to know your numbers: how much calorie you are consuming and how much calorie you need. More importantly, our carbohydrates sources should be unprocessed naturally occurring  complex starch food items.  It is always important to balance nutrition also by  appropriate intake of proteins (mainly plant-based), healthy fats/oils, plenty of fruits and vegetables.   -The American College of Lifestyle Medicine (ACL M) recommends nutrition derived mostly from Whole Food, Plant Predominant Sources example an apple instead of applesauce or apple pie. Eat Plenty of vegetables, Mushrooms, fruits, Legumes, Whole Grains, Nuts, seeds in lieu of processed meats, processed snacks/pastries red meat, poultry, eggs.  Use only water or unsweetened tea for hydration.  The College also recommends the need to stay away from risky substances including alcohol, smoking; obtaining 7-9 hours of restorative sleep, at least 150 minutes of moderate intensity exercise weekly, importance of healthy social connections, and being mindful of stress and seek help when it is overwhelming.    -Sticking to a routine mealtime to eat 3 meals a day and avoiding unnecessary snacks is shown to have a big role in weight control. Under normal circumstances, the only time we burn stored energy is when we are hungry, so allow  some hunger to take place- hunger means no food between appropriate meal times, only water.  It is not advisable to starve.   -It is better to avoid simple carbohydrates including:  Cakes, Sweet Desserts, Ice Cream, Soda (diet and regular), Sweet Tea, Candies, Chips, Cookies, Store Bought Juices, Alcohol in Excess of  1-2 drinks a day, Lemonade,  Artificial Sweeteners, Doughnuts, Coffee Creamers, "Sugar-free" Products, etc, etc.  This is not a complete list.....    -Consulting with certified diabetes educators is proven to provide you with the most accurate and current information on diet.  Also, you may be  interested in discussing diet options/exchanges , we can schedule a visit with Laura [name], RDN, CDE for individualized nutrition education.  -Exercise: If you are able: 30 -60 minutes a day ,4 days a week, or 150 minutes of moderate intensity exercise weekly.    The longer the better if tolerated.  Combine stretch, strength, and aerobic activities.  If you were told in the past that you have high risk for cardiovascular diseases, or if you are currently symptomatic, you may seek evaluation by your heart doctor prior to initiating moderate to intense exercise programs.                                  Additional Care Considerations for Diabetes/Prediabetes   -Diabetes  is a chronic disease.  The most important care consideration is regular follow-up with your diabetes care provider with the goal being avoiding or delaying its complications and to take advantage of advances in medications and technology.  If appropriate actions are taken early enough, type 2 diabetes can even be   reversed.  Seek information from the right source.  - Whole Food, Plant Predominant Nutrition is highly recommended: Eat Plenty of vegetables, Mushrooms, fruits, Legumes, Whole Grains, Nuts, seeds in lieu of processed meats, processed snacks/pastries red meat, poultry, eggs as recommended by American College of  Lifestyle Medicine (ACLM).  -Type 2 diabetes is known to coexist with other important comorbidities such as high blood pressure and high cholesterol.  It is critical to control not only the  diabetes but also the high blood pressure and high cholesterol to minimize and delay the risk of complications including coronary artery disease, stroke, amputations, blindness, etc.  The good news is that this diet recommendation for type 2 diabetes is also very helpful for managing high cholesterol and high blood blood pressure.  - Studies showed that people with diabetes will benefit from a class of medications known as ACE inhibitors and statins.  Unless there are specific reasons not to be on these medications, the standard of care is to consider getting one from these groups of medications at an optimal doses.  These medications are generally considered safe and proven to help protect the heart and the kidneys.    - People with diabetes are encouraged to initiate and maintain regular follow-up with eye doctors, foot doctors, dentists , and if necessary heart and kidney doctors.     - It is highly recommended that people with diabetes quit smoking or stay away from smoking, and get yearly  flu vaccine and pneumonia vaccine at least every 5 years.  See above for additional recommendations on exercise, sleep, stress management , and healthy social connections.      

## 2022-10-25 ENCOUNTER — Ambulatory Visit (HOSPITAL_COMMUNITY)
Admission: RE | Admit: 2022-10-25 | Discharge: 2022-10-25 | Disposition: A | Discharge status: Home / Self Care | Payer: Medicare Other | Source: Ambulatory Visit | Attending: Family Medicine | Admitting: Family Medicine

## 2022-10-25 DIAGNOSIS — N644 Mastodynia: Secondary | ICD-10-CM | POA: Diagnosis not present

## 2022-10-25 DIAGNOSIS — R928 Other abnormal and inconclusive findings on diagnostic imaging of breast: Secondary | ICD-10-CM | POA: Diagnosis not present

## 2022-10-26 ENCOUNTER — Other Ambulatory Visit: Payer: Self-pay | Admitting: Family Medicine

## 2022-10-28 ENCOUNTER — Encounter: Payer: Self-pay | Admitting: Family Medicine

## 2022-10-28 ENCOUNTER — Other Ambulatory Visit: Payer: Self-pay

## 2022-10-28 ENCOUNTER — Telehealth: Payer: Self-pay | Admitting: Family Medicine

## 2022-10-28 MED ORDER — CITALOPRAM HYDROBROMIDE 40 MG PO TABS: 40.0000 mg | ORAL_TABLET | Freq: Every day | ORAL | 1 refills | Status: DC

## 2022-10-28 MED ORDER — CITALOPRAM HYDROBROMIDE 40 MG PO TABS: 40.0000 mg | ORAL_TABLET | Freq: Every day | ORAL | 0 refills | Status: DC

## 2022-10-28 NOTE — Telephone Encounter (Signed)
Pt states she is having difficulty getting a refill on her medication with [facility]. Can you please medication with refills to [facility] [address]?  Prescription Request  10/28/2022  Is this a "Controlled Substance" medicine? No  LOV: 08/12/2022  What is the name of the medication or equipment? citalopram (CELEXA) 40 MG tablet  wants 90 day qty   Have you contacted your pharmacy to request a refill? Yes   Which pharmacy would you like this sent to?  [id number] - [place], [place] - [address] [place] [address] Phone: [phone] Fax: [phone]  [facility] - [place], [place] - [address] [facility] [address] [place] [address] [place] [address] Phone: [phone] Fax: [phone]  [facility] - [place], [place] - [address] [place] [address] Phone: [phone] Fax: [phone]    Patient notified that their request is being sent to the clinical staff for review and that they should receive a response within 2 business days.   Please advise at [facility] [phone]

## 2022-10-28 NOTE — Telephone Encounter (Signed)
Refill sent.

## 2022-11-03 DIAGNOSIS — M25532 Pain in left wrist: Secondary | ICD-10-CM | POA: Diagnosis not present

## 2022-11-03 DIAGNOSIS — M79642 Pain in left hand: Secondary | ICD-10-CM | POA: Diagnosis not present

## 2022-11-03 DIAGNOSIS — M13849 Other specified arthritis, unspecified hand: Secondary | ICD-10-CM | POA: Diagnosis not present

## 2022-11-03 DIAGNOSIS — G5603 Carpal tunnel syndrome, bilateral upper limbs: Secondary | ICD-10-CM | POA: Diagnosis not present

## 2022-11-08 ENCOUNTER — Ambulatory Visit (INDEPENDENT_AMBULATORY_CARE_PROVIDER_SITE_OTHER): Payer: Medicare Other | Admitting: Family Medicine

## 2022-11-08 ENCOUNTER — Encounter: Payer: Self-pay | Admitting: Family Medicine

## 2022-11-08 VITALS — BP 110/62 | HR 62 | Ht 63.0 in | Wt 164.1 lb

## 2022-11-08 DIAGNOSIS — Z0001 Encounter for general adult medical examination with abnormal findings: Secondary | ICD-10-CM

## 2022-11-08 DIAGNOSIS — D511 Vitamin B12 deficiency anemia due to selective vitamin B12 malabsorption with proteinuria: Secondary | ICD-10-CM | POA: Diagnosis not present

## 2022-11-08 DIAGNOSIS — R5383 Other fatigue: Secondary | ICD-10-CM | POA: Insufficient documentation

## 2022-11-08 DIAGNOSIS — E782 Mixed hyperlipidemia: Secondary | ICD-10-CM

## 2022-11-08 DIAGNOSIS — R5382 Chronic fatigue, unspecified: Secondary | ICD-10-CM

## 2022-11-08 DIAGNOSIS — I10 Essential (primary) hypertension: Secondary | ICD-10-CM

## 2022-11-08 DIAGNOSIS — Z0000 Encounter for general adult medical examination without abnormal findings: Secondary | ICD-10-CM | POA: Insufficient documentation

## 2022-11-08 DIAGNOSIS — E785 Hyperlipidemia, unspecified: Secondary | ICD-10-CM | POA: Diagnosis not present

## 2022-11-08 DIAGNOSIS — E559 Vitamin D deficiency, unspecified: Secondary | ICD-10-CM

## 2022-11-08 DIAGNOSIS — D649 Anemia, unspecified: Secondary | ICD-10-CM | POA: Diagnosis not present

## 2022-11-08 MED ORDER — CITALOPRAM HYDROBROMIDE 40 MG PO TABS: 40.0000 mg | ORAL_TABLET | Freq: Every day | ORAL | 2 refills | Status: DC

## 2022-11-08 NOTE — Assessment & Plan Note (Signed)
Controlled, no change in medication DASH diet and commitment to daily physical activity for a minimum of 30 minutes discussed and encouraged, as a part of hypertension management. The importance of attaining a healthy weight is also discussed.     11/08/2022    8:13 AM 10/17/2022   10:31 AM 09/07/2022    1:48 PM 09/05/2022    3:48 PM 08/12/2022   10:50 AM 07/18/2022    9:38 AM 06/21/2022    2:21 PM  BP/Weight  Systolic BP 599 357 017 793 903 009 233  Diastolic BP 62 64 74 62 74 78 70  Wt. (Lbs) 164.12 164.4 166.8 167 165 172.4   BMI 29.07 kg/m2 28.22 kg/m2 28.63 kg/m2 28.67 kg/m2 29.23 kg/m2 30.54 kg/m2

## 2022-11-08 NOTE — Assessment & Plan Note (Signed)
Check CBC and B12 levels

## 2022-11-08 NOTE — Assessment & Plan Note (Signed)
Updated lab needed at/ before next visit.   

## 2022-11-08 NOTE — Assessment & Plan Note (Signed)
Hyperlipidemia:Low fat diet discussed and encouraged.   Lipid Panel  Lab Results  Component Value Date   CHOL 156 10/04/2022   HDL 55 10/04/2022   LDLCALC 86 10/04/2022   TRIG 75 10/04/2022   CHOLHDL 2.8 10/04/2022     Updated lab needed at/ before next visit.

## 2022-11-08 NOTE — Assessment & Plan Note (Signed)

## 2022-11-08 NOTE — Patient Instructions (Addendum)
F/U in 6 months, call if you need me sooner  Labs today lipid, cmp and EGFR, CBC, B12 level, vit D  [facility] [address]  No med changes at this time  It is important that you exercise regularly at least 30 minutes 5 times a week. If you develop chest pain, have severe difficulty breathing, or feel very tired, stop exercising immediately and seek medical attention    Thanks for choosing [facility], we consider it a privelige to serve you.

## 2022-11-08 NOTE — Progress Notes (Signed)
Laura [name]     MRN: [id number]      DOB: 1950/12/14  HPI: Patient is in for annual physical exam c/o left hand pain being currently managed by ortho with am appt for follow up. Recent labs,  are reviewed.needs to beupdated at the pharmacy. .   PE: BP 110/62 (BP Location: Right Arm, Patient Position: Sitting, Cuff Size: Large)   Pulse 62   Ht 5\' 3"  (1.6 m)   Wt 164 lb 1.9 oz (74.4 kg)   SpO2 97%   BMI 29.07 kg/m   Pleasant  female, alert and oriented x 3, in no cardio-pulmonary distress. Afebrile. HEENT No facial trauma or asymetry. Sinuses non tender.  Extra occullar muscles intact.. External ears normal, .TM clear bilaterally Neck: supple, no adenopathy,JVD or thyromegaly.No bruits.  Chest: Clear to ascultation bilaterally.No crackles or wheezes. Non tender to palpation   Cardiovascular system; Heart sounds normal,  S1 and  S2 ,no S3.  No murmur, or thrill. Apical beat not displaced Peripheral pulses normal.  Abdomen: Soft, non tender, no organomegaly or masses. No bruits. Bowel sounds normal. No guarding, tenderness or rebound.    Musculoskeletal exam: Adequate  ROM of spine, hips , shoulders and slightly reduced in left knee. No deformity ,swelling or crepitus noted. No muscle wasting or atrophy.   Neurologic: Cranial nerves 2 to 12 intact. Power, tone ,sensation  normal throughout. No disturbance in gait. No tremor.  Skin: Intact, no ulceration, erythema , scaling or rash noted. Pigmentation normal throughout  Psych; Normal mood and affect. Judgement and concentration normal   Assessment & Plan:  Encounter for annual physical exam Annual exam as documented. Counseling done  re healthy lifestyle involving commitment to 150 minutes exercise per week, heart healthy diet, and attaining healthy weight.The importance of adequate sleep also discussed.  Immunization and cancer screening needs are specifically addressed at this visit.   Annual  visit for general adult medical examination with abnormal findings Annual exam as documented. Counseling done  re healthy lifestyle involving commitment to 150 minutes exercise per week, heart healthy diet, and attaining healthy weight.The importance of adequate sleep also discussed. Regular seat belt use and home safety, is also discussed. Changes in health habits are decided on by the patient with goals and time frames  set for achieving them. Immunization and cancer screening needs are specifically addressed at this visit.   Mixed hyperlipidemia Hyperlipidemia:Low fat diet discussed and encouraged.   Lipid Panel  Lab Results  Component Value Date   CHOL 156 10/04/2022   HDL 55 10/04/2022   LDLCALC 86 10/04/2022   TRIG 75 10/04/2022   CHOLHDL 2.8 10/04/2022     Updated lab needed at/ before next visit.   Essential hypertension Controlled, no change in medication DASH diet and commitment to daily physical activity for a minimum of 30 minutes discussed and encouraged, as a part of hypertension management. The importance of attaining a healthy weight is also discussed.     11/08/2022    8:13 AM 10/17/2022   10:31 AM 09/07/2022    1:48 PM 09/05/2022    3:48 PM 08/12/2022   10:50 AM 07/18/2022    9:38 AM 06/21/2022    2:21 PM  BP/Weight  Systolic BP 110 116 129 113 114 138 120  Diastolic BP 62 64 74 62 74 78 70  Wt. (Lbs) 164.12 164.4 166.8 167 165 172.4   BMI 29.07 kg/m2 28.22 kg/m2 28.63 kg/m2 28.67 kg/m2 29.23 kg/m2 30.54 kg/m2  Fatigue Check CBC and B12 levels

## 2022-11-08 NOTE — Assessment & Plan Note (Signed)
Annual exam as documented. Counseling done  re healthy lifestyle involving commitment to 150 minutes exercise per week, heart healthy diet, and attaining healthy weight.The importance of adequate sleep also discussed.  Immunization and cancer screening needs are specifically addressed at this visit.  

## 2022-11-09 ENCOUNTER — Other Ambulatory Visit: Payer: Self-pay

## 2022-11-09 DIAGNOSIS — D649 Anemia, unspecified: Secondary | ICD-10-CM

## 2022-11-09 DIAGNOSIS — R200 Anesthesia of skin: Secondary | ICD-10-CM | POA: Diagnosis not present

## 2022-11-09 LAB — CMP14+EGFR
ALT: 15 IU/L (ref 0–32)
AST: 25 IU/L (ref 0–40)
Albumin/Globulin Ratio: 1.6 (ref 1.2–2.2)
Albumin: 4.4 g/dL (ref 3.8–4.8)
Alkaline Phosphatase: 76 IU/L (ref 44–121)
BUN/Creatinine Ratio: 13 (ref 12–28)
BUN: 13 mg/dL (ref 8–27)
Bilirubin Total: 0.7 mg/dL (ref 0.0–1.2)
CO2: 26 mmol/L (ref 20–29)
Calcium: 10.5 mg/dL — ABNORMAL HIGH (ref 8.7–10.3)
Chloride: 98 mmol/L (ref 96–106)
Creatinine, Ser: 1.04 mg/dL — ABNORMAL HIGH (ref 0.57–1.00)
Globulin, Total: 2.8 g/dL (ref 1.5–4.5)
Glucose: 102 mg/dL — ABNORMAL HIGH (ref 70–99)
Potassium: 3.4 mmol/L — ABNORMAL LOW (ref 3.5–5.2)
Sodium: 140 mmol/L (ref 134–144)
Total Protein: 7.2 g/dL (ref 6.0–8.5)
eGFR: 57 mL/min/{1.73_m2} — ABNORMAL LOW (ref >59)

## 2022-11-09 LAB — VITAMIN D 25 HYDROXY (VIT D DEFICIENCY, FRACTURES): Vit D, 25-Hydroxy: 42.8 ng/mL (ref 30.0–100.0)

## 2022-11-09 LAB — CBC
Hematocrit: 34.5 % (ref 34.0–46.6)
Hemoglobin: 10.8 g/dL — ABNORMAL LOW (ref 11.1–15.9)
MCH: 27.6 pg (ref 26.6–33.0)
MCHC: 31.3 g/dL — ABNORMAL LOW (ref 31.5–35.7)
MCV: 88 fL (ref 79–97)
Platelets: 301 10*3/uL (ref 150–450)
RBC: 3.91 x10E6/uL (ref 3.77–5.28)
RDW: 12 % (ref 11.7–15.4)
WBC: 6 10*3/uL (ref 3.4–10.8)

## 2022-11-09 LAB — VITAMIN B12: Vitamin B-12: 1839 pg/mL — ABNORMAL HIGH (ref 232–1245)

## 2022-11-09 LAB — LIPID PANEL
Chol/HDL Ratio: 2.9 ratio (ref 0.0–4.4)
Cholesterol, Total: 185 mg/dL (ref 100–199)
HDL: 63 mg/dL (ref >39)
LDL Chol Calc (NIH): 103 mg/dL — ABNORMAL HIGH (ref 0–99)
Triglycerides: 108 mg/dL (ref 0–149)
VLDL Cholesterol Cal: 19 mg/dL (ref 5–40)

## 2022-11-10 NOTE — Progress Notes (Signed)
1.11.24 returned call..thc

## 2022-11-12 LAB — IRON: Iron: 82 ug/dL (ref 27–139)

## 2022-11-12 LAB — FERRITIN: Ferritin: 133 ng/mL (ref 15–150)

## 2022-11-12 LAB — SPECIMEN STATUS REPORT

## 2022-11-14 DIAGNOSIS — M25532 Pain in left wrist: Secondary | ICD-10-CM | POA: Diagnosis not present

## 2022-11-16 ENCOUNTER — Telehealth: Payer: Self-pay | Admitting: Family Medicine

## 2022-11-16 NOTE — Telephone Encounter (Signed)
noted 

## 2022-11-16 NOTE — Telephone Encounter (Signed)
Pt called stating that her phar did send her a refill on citalopram (CELEXA) 40 MG tablet . She just wanted to let you know.

## 2022-11-21 DIAGNOSIS — G5603 Carpal tunnel syndrome, bilateral upper limbs: Secondary | ICD-10-CM | POA: Diagnosis not present

## 2022-12-12 ENCOUNTER — Other Ambulatory Visit: Payer: Self-pay | Admitting: Family Medicine

## 2022-12-18 ENCOUNTER — Other Ambulatory Visit: Payer: Self-pay | Admitting: Family Medicine

## 2022-12-26 ENCOUNTER — Telehealth: Payer: Self-pay | Admitting: Family Medicine

## 2022-12-26 NOTE — Telephone Encounter (Signed)
Error

## 2023-01-04 ENCOUNTER — Other Ambulatory Visit: Payer: Self-pay

## 2023-01-04 MED ORDER — BUPROPION HCL ER (XL) 150 MG PO TB24: ORAL_TABLET | ORAL | 2 refills | Status: DC

## 2023-01-05 DIAGNOSIS — G5603 Carpal tunnel syndrome, bilateral upper limbs: Secondary | ICD-10-CM | POA: Diagnosis not present

## 2023-01-05 DIAGNOSIS — M25532 Pain in left wrist: Secondary | ICD-10-CM | POA: Diagnosis not present

## 2023-01-05 DIAGNOSIS — M79642 Pain in left hand: Secondary | ICD-10-CM | POA: Diagnosis not present

## 2023-01-05 DIAGNOSIS — M13849 Other specified arthritis, unspecified hand: Secondary | ICD-10-CM | POA: Diagnosis not present

## 2023-01-13 DIAGNOSIS — G5602 Carpal tunnel syndrome, left upper limb: Secondary | ICD-10-CM | POA: Diagnosis not present

## 2023-01-13 DIAGNOSIS — M67834 Other specified disorders of tendon, left wrist: Secondary | ICD-10-CM | POA: Diagnosis not present

## 2023-01-19 ENCOUNTER — Other Ambulatory Visit: Payer: Self-pay | Admitting: "Endocrinology

## 2023-01-25 DIAGNOSIS — M25532 Pain in left wrist: Secondary | ICD-10-CM | POA: Diagnosis not present

## 2023-02-06 ENCOUNTER — Ambulatory Visit (INDEPENDENT_AMBULATORY_CARE_PROVIDER_SITE_OTHER): Payer: Medicare Other | Admitting: Internal Medicine

## 2023-02-06 ENCOUNTER — Encounter: Payer: Self-pay | Admitting: Internal Medicine

## 2023-02-06 VITALS — BP 107/69 | HR 67 | Ht 63.0 in | Wt 163.0 lb

## 2023-02-06 DIAGNOSIS — M170 Bilateral primary osteoarthritis of knee: Secondary | ICD-10-CM

## 2023-02-06 DIAGNOSIS — G8929 Other chronic pain: Secondary | ICD-10-CM | POA: Diagnosis not present

## 2023-02-06 MED ORDER — TRIAMCINOLONE ACETONIDE 40 MG/ML IJ SUSP: 40.0000 mg | Freq: Once | INTRAMUSCULAR | Status: DC

## 2023-02-06 NOTE — Progress Notes (Signed)
HPI:[name] is a 72 y.o. female with bilateral knee osteoarthritis and history of left knee arthroplasty in 2018 who presents for evaluation of knee pain. For the details of today's visit, please refer to the assessment and plan.  Physical Exam: Vitals:   02/06/23 0947  BP: 107/69  Pulse: 67  SpO2: 95%  Weight: 163 lb (73.9 kg)  Height: 5\' 3"  (1.6 m)     Physical Exam Constitutional:      Appearance: Normal appearance.  Musculoskeletal:        General: No swelling, deformity or signs of injury.     Right knee: No swelling, deformity or effusion.     Left knee: No swelling, deformity or effusion.  Skin:    Comments: Surgical scar over left knee      Assessment & Plan:   Laura [name] was seen today for knee injection.  Chronic pain of right knee -     Triamcinolone Acetonide  Chronic pain of left knee -     Triamcinolone Acetonide  Primary osteoarthritis of both knees Assessment & Plan: Patient presents with chronic pain in right knee and left knee.  Patient found good relief with steroid injection of right knee with anterolateral approach. This was completed 5 months ago. She would like repeat injection and would like injection in her left knee. Review of records show patient had left knee arthroplasty in 2018 for medial compartment osteoarthritis.   Aspiration and injection of left knee. Appears to have larger effusion, but I expect this is thickened synovium after only 3 cc on aspiration.  Injection of right knee.   Aspiration/Injection Procedure Note Laura [name] 1951-02-09  Procedure: Aspiration and Injection Indications: Right knee pain  Procedure Details Consent: Risks of procedure as well as the alternatives and risks of each were explained to the patient.  Consent for procedure obtained. Time Out: Verified patient identification, verified procedure, site/side was marked, verified correct patient position, special equipment,  medications/allergies/relevent history reviewed, required imaging and test results available.  Performed.  The area was cleaned with alcohol swabs.    The right knee superior lateral suprapatellar pouch was injected using 1 cc of 1% lidocaine on a 22-gauge 1-1/2 inch needle.The syringe was switched to mixture containing 1 cc's of 40 mg Kenalog and 4 cc's of .25% bupivacaine was injected.  Ultrasound was used. Images were obtained in long views showing the injection.    Patient did tolerate procedure well.   Aspiration/Injection Procedure Note Laura [name] [birth year]  Procedure: Aspiration and Injection Indications: Left knee pain  Procedure Details Consent: Risks of procedure as well as the alternatives and risks of each were explained to the patient.  Consent for procedure obtained. Time Out: Verified patient identification, verified procedure, site/side was marked, verified correct patient position, special equipment, medications/allergies/relevent history reviewed, required imaging and test results available.  Performed.  The area was cleaned with iodine and alcohol swabs.    The left knee superior lateral suprapatellar pouch was injected using 1 cc of 1% lidocaine on a 22-gauge 1-1/2 inch needle.  An 18-gauge 1-1/2 inch needle was used to achieve aspiration.  The syringe was switched to mixture containing 1 cc's of 40 mg Kenalog and 4 cc's of 1% bupivacaine was injected.  Ultrasound was used. Images were obtained in long views showing the injection.    Amount of Fluid Aspirated: minimal amount Character of Fluid: straw colored Fluid was not sent for testing.  A sterile dressing was applied.  Patient did tolerate procedure well.            Milus Banister, MD

## 2023-02-06 NOTE — Patient Instructions (Signed)
Thank you for trusting me with your care. To recap, today we discussed the following:   Ice your knees upon leaving clinic and refrain from overuse over the next 3 days. Go to the emergency room with any usual pain, swelling, or redness occurred in the injected area.

## 2023-02-06 NOTE — Assessment & Plan Note (Signed)
Patient presents with chronic pain in right knee and left knee.  Patient found good relief with steroid injection of right knee with anterolateral approach. This was completed 5 months ago. She would like repeat injection and would like injection in her left knee. Review of records show patient had left knee arthroplasty in 2018 for medial compartment osteoarthritis.   Aspiration and injection of left knee. Appears to have larger effusion, but I expect this is thickened synovium after only 3 cc on aspiration.  Injection of right knee.   Aspiration/Injection Procedure Note Laura [name] 12-16-1950  Procedure: Aspiration and Injection Indications: Right knee pain  Procedure Details Consent: Risks of procedure as well as the alternatives and risks of each were explained to the patient.  Consent for procedure obtained. Time Out: Verified patient identification, verified procedure, site/side was marked, verified correct patient position, special equipment, medications/allergies/relevent history reviewed, required imaging and test results available.  Performed.  The area was cleaned with alcohol swabs.    The right knee superior lateral suprapatellar pouch was injected using 1 cc of 1% lidocaine on a 22-gauge 1-1/2 inch needle.The syringe was switched to mixture containing 1 cc's of 40 mg Kenalog and 4 cc's of .25% bupivacaine was injected.  Ultrasound was used. Images were obtained in long views showing the injection.    Patient did tolerate procedure well.   Aspiration/Injection Procedure Note Laura [name] [birth year]  Procedure: Aspiration and Injection Indications: Left knee pain  Procedure Details Consent: Risks of procedure as well as the alternatives and risks of each were explained to the patient.  Consent for procedure obtained. Time Out: Verified patient identification, verified procedure, site/side was marked, verified correct patient position, special equipment,  medications/allergies/relevent history reviewed, required imaging and test results available.  Performed.  The area was cleaned with iodine and alcohol swabs.    The left knee superior lateral suprapatellar pouch was injected using 1 cc of 1% lidocaine on a 22-gauge 1-1/2 inch needle.  An 18-gauge 1-1/2 inch needle was used to achieve aspiration.  The syringe was switched to mixture containing 1 cc's of 40 mg Kenalog and 4 cc's of 1% bupivacaine was injected.  Ultrasound was used. Images were obtained in long views showing the injection.    Amount of Fluid Aspirated: minimal amount Character of Fluid: straw colored Fluid was not sent for testing.  A sterile dressing was applied.   Patient did tolerate procedure well.

## 2023-02-13 MED ORDER — BUPIVACAINE HCL (PF) 0.25 % IJ SOLN: 8.0000 mL | Freq: Once | INTRAMUSCULAR | Status: DC

## 2023-02-13 MED ORDER — LIDOCAINE HCL (PF) 1 % IJ SOLN: 2.0000 mL | Freq: Once | INTRAMUSCULAR | Status: DC

## 2023-02-13 NOTE — Addendum Note (Signed)
Addended by: Gardenia Phlegm on: 02/13/2023 07:54 AM   Modules accepted: Orders

## 2023-02-20 ENCOUNTER — Encounter: Payer: Self-pay | Admitting: Internal Medicine

## 2023-02-20 ENCOUNTER — Ambulatory Visit (INDEPENDENT_AMBULATORY_CARE_PROVIDER_SITE_OTHER): Payer: Medicare Other | Admitting: Internal Medicine

## 2023-02-20 VITALS — BP 122/61 | HR 62 | Resp 16 | Ht 63.0 in | Wt 166.0 lb

## 2023-02-20 DIAGNOSIS — M25511 Pain in right shoulder: Secondary | ICD-10-CM

## 2023-02-20 MED ORDER — TRIAMCINOLONE ACETONIDE 40 MG/ML IJ SUSP
40.0000 mg | Freq: Once | INTRAMUSCULAR | Status: AC
  Administered 2023-02-20: 40 mg via INTRA_ARTICULAR

## 2023-02-20 NOTE — Progress Notes (Signed)
   HPI:Laura [name] is a 72 y.o. female who presents for evaluation of right shoulder pain. This is a chronic intermediate pain dating back to 2013. She has tried PT and treated with systemic steroids or OTC pain medications. She recently had flair of pain and requested appointment for subacromial injections.   Physical Exam: Vitals:   02/20/23 0905  BP: 122/61  Pulse: 62  Resp: 16  SpO2: 96%  Weight: 166 lb (75.3 kg)  Height:  (1.6 m)     Physical Exam Constitutional:      General: She is not in acute distress.    Appearance: Normal appearance.  Musculoskeletal:     Comments: Shoulder: No obvious deformity or asymmetry. No bruising. No swelling No TTP Full ROM in flexion, abduction, internal/external rotation NV intact distally Special Tests:  - Impingement: Neg Hawkins and Neers.  - Supraspinatus: Negative empty can.  5/5 strength, pain produced  - Infraspinatus/Teres: 5/5 strength with ER - Subscapularis: negative belly press, negative bear hug. 5/5 strength with IR - Biceps tendon: Negative Speeds.          Assessment & Plan:   Laura [name] was seen today for shoulder pain.  Acute pain of right shoulder Assessment & Plan: Chronic right shoulder pain. Evaluated with ultrasound and anacheoic fluid above the supraspinatus suggest subacromial bursitis.   Plan: subacromial corticosteroid  injection today Home exercises given for rotator cuff strengthening Follow up if symptoms worsen or fail to improve  Injection/Aspiration JOSUE KASS 02/22/1951  Procedure: Injection Indications: subacromial bursitis  Procedure Details Consent: Risks of procedure as well as the alternatives and risks of each were explained to the (patient/caregiver).  Consent for procedure obtained. Time-out performed, with confirmation of the patient's name, date of birth, and correct identification of the right shoulder to be injected. The area was cleaned with iodine and alcohol  swabs. The right subacromial bursa was injected  under ultrasound guidance with 1 cc's of 40 mg Depo-Medrol and 4 cc's of 1% lidocaine using a  23  gauge needle. There were no complications. The patient tolerated the procedure well. There was minimal bleeding. The patient was instructed to ice her shoulder upon leaving clinic and refrain from overuse over the next 3 days. The patient was instructed to go to the emergency room with any usual pain, swelling, or redness occurred in the injected area.     Orders: -     Triamcinolone Acetonide      Milus Banister, MD

## 2023-02-20 NOTE — Assessment & Plan Note (Addendum)
Chronic right shoulder pain. Evaluated with ultrasound and anacheoic fluid above the supraspinatus suggest subacromial bursitis.   Plan: subacromial corticosteroid  injection today Home exercises given for rotator cuff strengthening Follow up if symptoms worsen or fail to improve  Injection/Aspiration Laura [name] [birth year]  Procedure: Injection Indications: subacromial bursitis  Procedure Details Consent: Risks of procedure as well as the alternatives and risks of each were explained to the (patient/caregiver).  Consent for procedure obtained. Time-out performed, with confirmation of the patient's name, date of birth, and correct identification of the right shoulder to be injected. The area was cleaned with iodine and alcohol swabs. The right subacromial bursa was injected  under ultrasound guidance with 1 cc's of 40 mg Depo-Medrol and 4 cc's of 1% lidocaine using a  23  gauge needle. There were no complications. The patient tolerated the procedure well. There was minimal bleeding. The patient was instructed to ice her shoulder upon leaving clinic and refrain from overuse over the next 3 days. The patient was instructed to go to the emergency room with any usual pain, swelling, or redness occurred in the injected area.

## 2023-02-20 NOTE — Patient Instructions (Signed)
Thank you for trusting me with your care. To recap, today we discussed the following:   Left shoulder pain  You have strained your shoulder muscle.   Ice your shoulder upon leaving clinic and refrain from overuse over the next 3 days. Go to the emergency room with any usual pain, swelling, or redness occurred in the injected area.    Follow up if symptoms worsen or fail to improve

## 2023-02-28 DIAGNOSIS — J309 Allergic rhinitis, unspecified: Secondary | ICD-10-CM | POA: Diagnosis not present

## 2023-03-03 ENCOUNTER — Other Ambulatory Visit: Payer: Self-pay | Admitting: Family Medicine

## 2023-03-04 ENCOUNTER — Other Ambulatory Visit: Payer: Self-pay | Admitting: Family Medicine

## 2023-03-13 ENCOUNTER — Ambulatory Visit (INDEPENDENT_AMBULATORY_CARE_PROVIDER_SITE_OTHER): Payer: Medicare Other | Admitting: Internal Medicine

## 2023-03-13 ENCOUNTER — Encounter: Payer: Self-pay | Admitting: Internal Medicine

## 2023-03-13 ENCOUNTER — Telehealth: Payer: Self-pay | Admitting: Internal Medicine

## 2023-03-13 VITALS — BP 146/81 | HR 68 | Ht 63.0 in | Wt 171.0 lb

## 2023-03-13 DIAGNOSIS — M1711 Unilateral primary osteoarthritis, right knee: Secondary | ICD-10-CM | POA: Insufficient documentation

## 2023-03-13 DIAGNOSIS — G8929 Other chronic pain: Secondary | ICD-10-CM | POA: Diagnosis not present

## 2023-03-13 DIAGNOSIS — M25561 Pain in right knee: Secondary | ICD-10-CM | POA: Diagnosis not present

## 2023-03-13 MED ORDER — TRAMADOL HCL 50 MG PO TABS
50.0000 mg | ORAL_TABLET | Freq: Three times a day (TID) | ORAL | 0 refills | Status: DC
Start: 2023-03-13 — End: 2023-05-15

## 2023-03-13 NOTE — Assessment & Plan Note (Signed)
Patient presents with pain in her right knee. This is chronic pain and she has not had any acute injury. It has been one month since steroid injection of right knee. She initially found pain relief but pain has returned in the last few days. She has been prescribed Tramadol which helped with her chronic knee pain. She would like to avoid knee surgery.   No effusion seen with POCUS. Medial joint line tenderness.   Your pain is due to arthritis. These are the different medications you can take for this: Tylenol 500mg  1-2 tabs three times a day for pain. Capsaicin, aspercreme, or biofreeze topically up to four times a day may also help with pain. Straight leg raises, knee extensions 3 sets of 10 once a day (add ankle weight if these become too easy). I will place referral to physical therapy  Heat or ice 15 minutes at a time 3-4 times a day as needed to help with pain. Water aerobics and cycling with low resistance are the best two types of exercise for arthritis though any exercise is ok as long as it doesn't worsen the pain. Tramadol refilled, take only when pain is severe.

## 2023-03-13 NOTE — Progress Notes (Signed)
   HPI:Laura [name] is a 72 y.o. female who presents for evaluation of right knee pain. For the details of today's visit, please refer to the assessment and plan.  Physical Exam: Vitals:   03/13/23 0819  BP: (!) 146/81  Pulse: 68  SpO2: 97%  Weight: 171 lb (77.6 kg)  Height: 5\' 3"  (1.6 m)     Physical Exam Constitutional:      General: She is not in acute distress.    Appearance: She is not ill-appearing.  Musculoskeletal:     Comments: Knee: - Inspection: No gross deformity. No effusion. No erythema or bruising. Skin intact - TTP on medial joint line - ROM: full active ROM with flexion and extension in knee - Strength: 5/5 strength - Neuro/vasc: NV intact - Special Tests: - LIGAMENTS: negative anterior/posterior drawer, negative Lachman's, no MCL or LCL laxity  -- MENISCUS: no pain or clicking with McMurray's -- PF JOINT: nml patellar mobility without apprehension. Negative patellar grind           Assessment & Plan:   Adamaris was seen today for knee pain.  Chronic pain of right knee Assessment & Plan: Patient presents with pain in her right knee. This is chronic pain and she has not had any acute injury. It has been one month since steroid injection of right knee. She initially found pain relief but pain has returned in the last few days. She has been prescribed Tramadol which helped with her chronic knee pain. She would like to avoid knee surgery.   No effusion seen with POCUS. Medial joint line tenderness.   Your pain is due to arthritis. These are the different medications you can take for this: Tylenol 500mg  1-2 tabs three times a day for pain. Capsaicin, aspercreme, or biofreeze topically up to four times a day may also help with pain. Straight leg raises, knee extensions 3 sets of 10 once a day (add ankle weight if these become too easy). I will place referral to physical therapy  Heat or ice 15 minutes at a time 3-4 times a day as needed to help with  pain. Water aerobics and cycling with low resistance are the best two types of exercise for arthritis though any exercise is ok as long as it doesn't worsen the pain. Tramadol refilled, take only when pain is severe.   Orders: -     traMADol HCl; Take 1 tablet (50 mg total) by mouth every 8 (eight) hours.  Dispense: 20 tablet; Refill: 0      Milus Banister, MD

## 2023-03-13 NOTE — Patient Instructions (Signed)
Your pain is due to arthritis. These are the different medications you can take for this: Tylenol 500mg  1-2 tabs three times a day for pain. Capsaicin, aspercreme, or biofreeze topically up to four times a day may also help with pain. Straight leg raises, knee extensions 3 sets of 10 once a day (add ankle weight if these become too easy). I will place referral to physical therapy  Heat or ice 15 minutes at a time 3-4 times a day as needed to help with pain. Water aerobics and cycling with low resistance are the best two types of exercise for arthritis though any exercise is ok as long as it doesn't worsen the pain. Tramadol refilled, take only when pain is severe.   Follow up if needed

## 2023-03-13 NOTE — Telephone Encounter (Signed)
Patient came by the office and said her sciatica right side into legs and said this might be what is causing her situation what is going on. She is asking don't leave [facility]PC.

## 2023-03-13 NOTE — Telephone Encounter (Signed)
Thank you :)

## 2023-03-24 ENCOUNTER — Encounter: Payer: Self-pay | Admitting: Family Medicine

## 2023-03-24 ENCOUNTER — Ambulatory Visit (INDEPENDENT_AMBULATORY_CARE_PROVIDER_SITE_OTHER): Payer: Medicare Other | Admitting: Family Medicine

## 2023-03-24 VITALS — BP 120/70 | HR 71 | Ht 63.0 in | Wt 169.0 lb

## 2023-03-24 DIAGNOSIS — M792 Neuralgia and neuritis, unspecified: Secondary | ICD-10-CM | POA: Diagnosis not present

## 2023-03-24 DIAGNOSIS — E782 Mixed hyperlipidemia: Secondary | ICD-10-CM | POA: Diagnosis not present

## 2023-03-24 DIAGNOSIS — I10 Essential (primary) hypertension: Secondary | ICD-10-CM | POA: Diagnosis not present

## 2023-03-24 DIAGNOSIS — F324 Major depressive disorder, single episode, in partial remission: Secondary | ICD-10-CM

## 2023-03-24 DIAGNOSIS — R1013 Epigastric pain: Secondary | ICD-10-CM

## 2023-03-24 DIAGNOSIS — K219 Gastro-esophageal reflux disease without esophagitis: Secondary | ICD-10-CM

## 2023-03-24 DIAGNOSIS — E890 Postprocedural hypothyroidism: Secondary | ICD-10-CM

## 2023-03-24 MED ORDER — GABAPENTIN 100 MG PO CAPS: ORAL_CAPSULE | ORAL | 3 refills | Status: DC

## 2023-03-24 NOTE — Assessment & Plan Note (Signed)
Controlled, no change in medication DASH diet and commitment to daily physical activity for a minimum of 30 minutes discussed and encouraged, as a part of hypertension management. The importance of attaining a healthy weight is also discussed.     03/24/2023    9:54 AM 03/24/2023    9:08 AM 03/13/2023    8:19 AM 02/20/2023    9:05 AM 02/06/2023    9:47 AM 11/08/2022    8:13 AM 10/17/2022   10:31 AM  BP/Weight  Systolic BP 120 136 146 122 107 110 116  Diastolic BP 70 76 81 61 69 62 64  Wt. (Lbs)  169 171 166 163 164.12 164.4  BMI  29.94 kg/m2 30.29 kg/m2 29.41 kg/m2 28.87 kg/m2 29.07 kg/m2 28.22 kg/m2     Updated lab needed at/ before next visit.

## 2023-03-24 NOTE — Assessment & Plan Note (Signed)
Increased and unconrtrolled refer GI

## 2023-03-24 NOTE — Assessment & Plan Note (Signed)
Reports heartburn and belching uncontrolled refer  GI

## 2023-03-24 NOTE — Assessment & Plan Note (Signed)
Hyperlipidemia:Low fat diet discussed and encouraged.   Lipid Panel  Lab Results  Component Value Date   CHOL 185 11/08/2022   HDL 63 11/08/2022   LDLCALC 103 (H) 11/08/2022   TRIG 108 11/08/2022   CHOLHDL 2.9 11/08/2022     Updated lab needed at/ before next visit.

## 2023-03-24 NOTE — Patient Instructions (Addendum)
Follow-up early symptom reevaluate chronic problems.  Increase dose of gabapentin to 2 capsules at bedtime, this will help with pain as well as sleep.  Please get liver and kidney function lab work done when you are having blood work for Dr. Fransico Him, we will give you the lab requisition form.  Excellent blood pressure , lungs are clear, weight is healthy, and you are keeping busy!  You are referred to GI re reflux  Thanks for choosing [facility], we consider it a privelige to serve you.

## 2023-03-25 ENCOUNTER — Encounter: Payer: Self-pay | Admitting: Family Medicine

## 2023-03-25 DIAGNOSIS — M792 Neuralgia and neuritis, unspecified: Secondary | ICD-10-CM | POA: Insufficient documentation

## 2023-03-25 NOTE — Assessment & Plan Note (Signed)
Increase gabapentin to 200mg at bedtime 

## 2023-03-25 NOTE — Assessment & Plan Note (Signed)
Controlled, no change in medication  

## 2023-03-25 NOTE — Assessment & Plan Note (Signed)
Managed by Endo and has upcoming  appt 

## 2023-03-25 NOTE — Progress Notes (Signed)
Laura [name]     MRN: [id number]      DOB: Jul 10, 1951  Chief Complaint  Patient presents with   Joint Swelling    Patient complains L wrist feeling warm after having carpal tunnel surgery earlier this year. States the warmth starts in her wrist and goes to her chest, then she has to move her bowels.     HPI Laura [name] is here for follow up and re-evaluation of chronic medical conditions, medication management and review of any available recent lab and radiology data.  Preventive health is updated, specifically  Cancer screening and Immunization.   Has had carpal tunnel surgery, still experiencing tingling in hand and actually in entire upper body from navel up, as before, and early am awaking States gabapentin has been beneficial in treating symptoms. The PT denies any adverse reactions to current medications since the last visit.  There are no new concerns.     ROS Denies recent fever or chills. Denies sinus pressure, nasal congestion, ear pain or sore throat. Denies chest congestion, productive cough or wheezing. Denies chest pains, palpitations and leg swelling C/o burning in chest and at  times acid coming up in her throat, needs GI re eval   Denies dysuria, frequency, hesitancy or incontinence. Chronic  joint pain, swelling and limitation in mobility. Denies headaches, seizures,  chronic numbness, and  tingling. Denies uncontrolled depression, anxiety or insomnia. Denies skin break down or rash.   PE  BP 120/70   Pulse 71   Ht 5\' 3"  (1.6 m)   Wt 169 lb (76.7 kg)   SpO2 98%   BMI 29.94 kg/m   Patient alert and oriented and in no cardiopulmonary distress.  HEENT: No facial asymmetry, EOMI,     Neck supple .  Chest: Clear to auscultation bilaterally.  CVS: S1, S2 no murmurs, no S3.Regular rate.  ABD: Soft non tender.   Ext: No edema  MS: Adequate ROM spine, shoulders, hips and knees.  Skin: Intact, no ulcerations or rash noted.  Psych: Good eye contact,  normal affect. Memory intact not anxious or depressed appearing.  CNS: CN 2-12 intact, power,  normal throughout.no focal deficits noted.   Assessment & Plan  Gastroesophageal reflux disease Reports heartburn and belching uncontrolled refer  GI  Dyspepsia Increased and unconrtrolled refer GI  Essential hypertension Controlled, no change in medication DASH diet and commitment to daily physical activity for a minimum of 30 minutes discussed and encouraged, as a part of hypertension management. The importance of attaining a healthy weight is also discussed.     03/24/2023    9:54 AM 03/24/2023    9:08 AM 03/13/2023    8:19 AM 02/20/2023    9:05 AM 02/06/2023    9:47 AM 11/08/2022    8:13 AM 10/17/2022   10:31 AM  BP/Weight  Systolic BP 120 136 146 122 107 110 116  Diastolic BP 70 76 81 61 69 62 64  Wt. (Lbs)  169 171 166 163 164.12 164.4  BMI  29.94 kg/m2 30.29 kg/m2 29.41 kg/m2 28.87 kg/m2 29.07 kg/m2 28.22 kg/m2     Updated lab needed at/ before next visit.   Mixed hyperlipidemia Hyperlipidemia:Low fat diet discussed and encouraged.   Lipid Panel  Lab Results  Component Value Date   CHOL 185 11/08/2022   HDL 63 11/08/2022   LDLCALC 103 (H) 11/08/2022   TRIG 108 11/08/2022   CHOLHDL 2.9 11/08/2022     Updated lab needed at/ before next  visit.   Depression, major, single episode, in partial remission (HCC) Controlled, no change in medication   Neuropathic pain Increase gabapentin to 200 mg at bedtime  Postsurgical hypothyroidism Managed by Endo and has upcoming appt

## 2023-03-28 ENCOUNTER — Other Ambulatory Visit: Payer: Self-pay | Admitting: Family Medicine

## 2023-03-30 ENCOUNTER — Telehealth: Payer: Self-pay | Admitting: *Deleted

## 2023-03-30 ENCOUNTER — Ambulatory Visit (INDEPENDENT_AMBULATORY_CARE_PROVIDER_SITE_OTHER): Payer: Medicare Other | Admitting: Gastroenterology

## 2023-03-30 ENCOUNTER — Encounter: Payer: Self-pay | Admitting: *Deleted

## 2023-03-30 ENCOUNTER — Encounter: Payer: Self-pay | Admitting: Gastroenterology

## 2023-03-30 VITALS — BP 117/73 | HR 66 | Temp 97.9°F | Ht 68.0 in | Wt 169.2 lb

## 2023-03-30 DIAGNOSIS — R1013 Epigastric pain: Secondary | ICD-10-CM

## 2023-03-30 NOTE — Patient Instructions (Signed)
We are arranging an upper endoscopy with Dr. Marletta Lor in the near future.  We will see you in 3 months!  Continue the omeprazole twice a day and famotidine (Pepcid) as needed.  I enjoyed seeing you again today! I value our relationship and want to provide genuine, compassionate, and quality care. You may receive a survey regarding your visit with me, and I welcome your feedback! Thanks so much for taking the time to complete this. I look forward to seeing you again.      Gelene Mink, PhD, ANP-BC [facility]

## 2023-03-30 NOTE — Telephone Encounter (Signed)
Pt has been scheduled for 04/20/23. Instructions mailed   [facility]: Notification or Prior Authorization is not required for the requested services You are not required to submit a notification/prior authorization based on the information provided. If you have general questions about the prior authorization requirements, visit [email or web address] > Clinician Resources > Advance and Admission Notification Requirements. The number above acknowledges your notification. Please write this reference number down for future reference. If you would like to request an organization determination, please call us at [phone]. Decision ID #: [id number]

## 2023-03-30 NOTE — Progress Notes (Signed)
Gastroenterology Office Note     Primary Care Physician:  Kerri Perches, MD  Primary Gastroenterologist: Dr. Marletta Lor    Chief Complaint   Chief Complaint  Patient presents with   Follow-up    Follow up on GERD, a lot of belching after that she is ok     History of Present Illness   Laura [name] is a 72 y.o. female presenting today in follow-up with a history of chronic abdominal pain, nocturnal flushing, weight loss, and work-up as outlined below negative for carcinoid, chronic constipation, GERD. Givens capsule in 2018 with gastritis.Last seen in 2023 with EGD recommended. This was postponed due to low potassium.   Returning now with continued dyspepsia. Epigastric pain, worsening reflux. Throat feels raw. Omeprazole BID chronically. Belching a lot in the morning. Has taken pantoprazole in the past. Pepcid daily. No dysphagia. Ate honeybun and chips before bed recently when asked if eating later at night. States not all the time. Goes to bed at 8. Drinks green tea. Avoids fried foods. Will take rare advil if tramadol doesn't help joint pain. Weight is stable.    Constipation resolved at this moment.   Colonoscopy 2022 : fair prep, hemorrhoids, diverticulosis, stool in entire colon but cleared with copious amounts of water.   EGD June 2021 with a few fundic gland polyps, otherwise unremarkable.  Biopsies did show chronic gastritis negative for H. pylori.   Additional workup has included:  CT June 2021 with no significant findings.  HIDA September 2021 with slightly low EF at 27%.  labs that had been completed included catecholamines,metanephrines, ESR, CRP, tryptase all of which were ok. 5 HIAA elevated at 13.6. TSH borderline low. CT A/P with and without contrast 10/06/20 with no acute findings.  NM PET with Dotatate 11/13/2020 with no evidence of well-differentiated neuroendocrine tumor within the abdomen or pelvis.  No evidence of metastatic disease. CTA: no  occlusive disease  Past Medical History:  Diagnosis Date   Anxiety    At low risk for fall 08/15/2018   Cystitis    Depression    Gastritis    GERD (gastroesophageal reflux disease)    Hashimoto's thyroiditis    Hx    Hyperlipidemia    Hypertension    Hypothyroidism    Normocytic anemia 2009 Hb 9.9-11.1   Osteopenia    Shoulder pain    Urinary incontinence     Past Surgical History:  Procedure Laterality Date   Bilateral tubal ligation     BIOPSY  04/21/2020   Procedure: BIOPSY;  Surgeon: Corbin Ade, MD;  Location: [facility];  Service: Endoscopy;;   BREAST EXCISIONAL BIOPSY  2010   Excisional biopsy of benign left breast mass -lopoma    BREAST SURGERY     left nreast biopsy   COLONOSCOPY  08/26/2011   SLF: 1. Internal hemorrhoids   COLONOSCOPY N/A 10/06/2015   ZOX:[id number] fissure or internal hemorrhoids/mild sigmoid colitis. benign colonic path    COLONOSCOPY WITH PROPOFOL N/A 03/15/2021   Procedure: COLONOSCOPY WITH PROPOFOL;  Surgeon: [name], DO;  Location: [facility];  Service: Endoscopy;  Laterality: N/A;  3:00pm   colonscopy  2005   Dr. Katrinka Blazing   ESOPHAGOGASTRODUODENOSCOPY  12/2009   chronic gastritis   ESOPHAGOGASTRODUODENOSCOPY N/A 05/17/2017   Dr. Darrick Penna; gastritis, normal small bowel biopsies, multiple gastric polyps. fundic gland polyps   ESOPHAGOGASTRODUODENOSCOPY (EGD) WITH PROPOFOL N/A 04/21/2020   Rourk: multiple gastric polyps, biopsy of one c/w fundic gland.  gastric biopsy showed mild chronic gastritis but no H.pylori   GIVENS CAPSULE STUDY N/A 05/30/2017   gastritis, no source for anemia identified   left knee surgery Left March 1,2017   PARTIAL KNEE ARTHROPLASTY Left 08/09/2017   Procedure: LEFT UNICOMPARTMENTAL KNEE;  Surgeon: Ollen Gross, MD;  Location: [facility];  Service: Orthopedics;  Laterality: Left;   POLYPECTOMY  04/21/2020   Procedure: POLYPECTOMY;  Surgeon: Corbin Ade, MD;  Location: [facility];  Service: Endoscopy;;   gastric   Resection of left lobe of thyroid     right carpal tunnel release     rt. knee athroscopy  2004   SAVORY DILATION N/A 05/17/2017   Procedure: SAVORY DILATION;  Surgeon: West Bali, MD;  Location: [facility];  Service: Endoscopy;  Laterality: N/A;   TOTAL ABDOMINAL HYSTERECTOMY  1994   UMBILICAL HERNIA REPAIR     Urethral dilation for stenosis  2009    Current Outpatient Medications  Medication Sig Dispense Refill   acetaminophen (TYLENOL) 500 MG tablet Take 1 tablet (500 mg total) by mouth every 6 (six) hours as needed. (Patient taking differently: Take 1,500 mg by mouth daily as needed for mild pain or headache.) 30 tablet 0   amLODipine (NORVASC) 5 MG tablet Take 1 tablet (5 mg total) by mouth daily. 30 tablet 3   atorvastatin (LIPITOR) 80 MG tablet TAKE 1 TABLET BY MOUTH ONCE  DAILY 90 tablet 3   buPROPion (WELLBUTRIN XL) 150 MG 24 hr tablet TAKE 1 TABLET(150 MG) BY MOUTH DAILY 30 tablet 2   Calcium Carbonate-Vit D-Min (CALCIUM 1200) 1200-1000 MG-UNIT CHEW Take one tablet by mouth two times daily 60 tablet 5   citalopram (CELEXA) 40 MG tablet TAKE 1 TABLET BY MOUTH DAILY 90 tablet 3   famotidine (PEPCID) 40 MG tablet Take 1 tablet (40 mg total) by mouth daily. As needed for heartburn/abdominal pain 30 tablet 5   fenofibrate (TRICOR) 48 MG tablet TAKE 1 TABLET BY MOUTH DAILY 90 tablet 3   furosemide (LASIX) 20 MG tablet TAKE 1 TABLET(20 MG) BY MOUTH DAILY 30 tablet 1   gabapentin (NEURONTIN) 100 MG capsule Take two capsules at bedtime for neuropathic pain 100 capsule 3   hydrochlorothiazide (HYDRODIURIL) 25 MG tablet TAKE 1 TABLET BY MOUTH DAILY 100 tablet 2   levothyroxine (SYNTHROID) 88 MCG tablet TAKE 1 TABLET BY MOUTH  DAILY BEFORE BREAKFAST 90 tablet 3   montelukast (SINGULAIR) 10 MG tablet Take 1 tablet (10 mg total) by mouth at bedtime. 30 tablet 3   Multiple Vitamin (MULTIVITAMIN WITH MINERALS) TABS tablet Take 1 tablet by mouth daily.     omeprazole (PRILOSEC)  20 MG capsule TAKE 1 CAPSULE(20 MG) BY MOUTH TWICE DAILY BEFORE A MEAL 180 capsule 3   Polyethyl Glycol-Propyl Glycol (SYSTANE OP) Place 1 drop into both eyes daily as needed (dry eyes).     potassium chloride SA (KLOR-CON M) 20 MEQ tablet Take two tablets three times daily for 5 days , then take two tablets twice daily 60 tablet 0   rosuvastatin (CRESTOR) 40 MG tablet TAKE 1 TABLET(40 MG) BY MOUTH DAILY 90 tablet 0   traMADol (ULTRAM) 50 MG tablet Take 1 tablet (50 mg total) by mouth every 8 (eight) hours. 20 tablet 0   amLODipine (NORVASC) 2.5 MG tablet TAKE 1 TABLET BY MOUTH DAILY (Patient not taking: Reported on 03/30/2023) 90 tablet 3   Lactobacillus Rhamnosus, GG, (CULTURELLE PO) Take by mouth daily. (Patient not taking: Reported  on 03/30/2023)     Current Facility-Administered Medications  Medication Dose Route Frequency Provider Last Rate Last Admin   bupivacaine (PF) (MARCAINE) 0.25 % injection 8 mL  8 mL Other Once Gardenia Phlegm, MD       lidocaine (PF) (XYLOCAINE) 1 % injection 2 mL  2 mL Other Once Gardenia Phlegm, MD       triamcinolone acetonide (KENALOG-40) injection 40 mg  40 mg Intra-articular Once Gardenia Phlegm, MD       triamcinolone acetonide (KENALOG-40) injection 40 mg  40 mg Intra-articular Once Gardenia Phlegm, MD        Allergies as of 03/30/2023 - Review Complete 03/30/2023  Allergen Reaction Noted   Cozaar [losartan potassium] Cough 07/04/2018   Dilaudid [hydromorphone hcl] Other (See Comments) 03/01/2011   Levaquin [levofloxacin] Other (See Comments) 07/28/2014   Nsaids Other (See Comments) 01/13/2010   Promethazine  05/17/2017    Family History  Problem Relation Age of Onset   Pneumonia Mother    Healthy Father    Colon cancer Neg Hx    Anesthesia problems Neg Hx    Hypotension Neg Hx    Malignant hyperthermia Neg Hx    Pseudochol deficiency Neg Hx     Social History   Socioeconomic History   Marital status: Married    Spouse name: Not on file    Number of children: 3   Years of education: Not on file   Highest education level: Not on file  Occupational History   Occupation: Designer, 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   Tobacco Use   Smoking status: Former    Packs/day: 0.25    Years: 1.00    Additional pack years: 0.00    Total pack years: 0.25    Types: Cigarettes    Quit date: 08/25/1972    Years since quitting: 50.6    Passive exposure: Past   Smokeless tobacco: Never  Vaping Use   Vaping Use: Never used  Substance and Sexual Activity   Alcohol use: No   Drug use: No   Sexual activity: Not Currently    Birth control/protection: Surgical    Comment: hyst  Other Topics Concern   Not on file  Social History Narrative   Not on file   Social Determinants of Health   Financial Resource Strain: Low Risk  (08/30/2021)   Overall Financial Resource Strain (CARDIA)    Difficulty of Paying Living Expenses: Not hard at all  Food Insecurity: No Food Insecurity (08/30/2021)   Hunger Vital Sign    Worried About Running Out of Food in the Last Year: Never true    Ran Out of Food in the Last Year: Never true  Transportation Needs: No Transportation Needs (08/30/2021)   PRAPARE - Administrator, Civil Service (Medical): No    Lack of Transportation (Non-Medical): No  Physical Activity: Inactive (08/30/2021)   Exercise Vital Sign    Days of Exercise per Week: 0 days    Minutes of Exercise per Session: 0 min  Stress: No Stress Concern Present (08/30/2021)   [employer] of Occupational Health - Occupational Stress Questionnaire    Feeling of Stress : Not at all  Social Connections: Moderately Integrated (08/30/2021)   Social Connection and Isolation Panel [NHANES]    Frequency of Communication with Friends and Family: More than three times a week    Frequency of Social Gatherings with Friends and Family: Twice a week    Attends Religious Services:  More than 4 times per year    Active Member of Clubs or  Organizations: No    Attends Banker Meetings: Never    Marital Status: Married  Catering manager Violence: Not At Risk (08/30/2021)   Humiliation, Afraid, Rape, and Kick questionnaire    Fear of Current or Ex-Partner: No    Emotionally Abused: No    Physically Abused: No    Sexually Abused: No     Review of Systems   Gen: Denies any fever, chills, fatigue, weight loss, lack of appetite.  CV: Denies chest pain, heart palpitations, peripheral edema, syncope.  Resp: Denies shortness of breath at rest or with exertion. Denies wheezing or cough.  GI: Denies dysphagia or odynophagia. Denies jaundice, hematemesis, fecal incontinence. GU : Denies urinary burning, urinary frequency, urinary hesitancy MS: Denies joint pain, muscle weakness, cramps, or limitation of movement.  Derm: Denies rash, itching, dry skin Psych: Denies depression, anxiety, memory loss, and confusion Heme: Denies bruising, bleeding, and enlarged lymph nodes.   Physical Exam   BP 117/73   Pulse 66   Temp 97.9 F (36.6 C)   Ht 5\' 8"  (1.727 m)   Wt 169 lb 3.2 oz (76.7 kg)   BMI 25.73 kg/m  General:   Alert and oriented. Pleasant and cooperative. Well-nourished and well-developed.  Head:  Normocephalic and atraumatic. Eyes:  Without icterus Cardiac: S1 S2 without obvious murmurs Lungs: CTAB Abdomen:  +BS, soft, non-tender and non-distended. No HSM noted. No guarding or rebound. No masses appreciated.  Rectal:  Deferred  Msk:  Symmetrical without gross deformities. Normal posture. Extremities:  Without edema. Neurologic:  Alert and  oriented x4;  grossly normal neurologically. Skin:  Intact without significant lesions or rashes. Psych:  Alert and cooperative. Normal mood and affect.   Assessment   Laura [name] is a 72 y.o. female presenting today in follow-up with a history of chronic abdominal pain, chronic constipation  but now resolved, GERD, with persistent dyspepsia.   Noting epigastric  pain and reflux last year and had recommended updated EGD (las in 2021 with fundic gland polyps, negative H.pylori). This was canceled due to hypokalemia.   Will arrange EGD in near future. GERD exacerbation may be multifactorial in light of diet/behavior; however, she seems for most part to employ behavior modification. Difficult historian. Continue omeprazole BID and Pepcid prn. May need to change PPI (has been on pantoprazole in past). She wants to stay on omeprazole till EGD. Also of note, she has a history of chronic abdominal pain, with negative evaluation as noted in HPI and CTA last year unrevealing. May benefit from TCA in future.   PLAN    Proceed with upper endoscopy by Dr. Marletta Lor in near future: the risks, benefits, and alternatives have been discussed with the patient in detail. The patient states understanding and desires to proceed.  Continue omeprazole BID and Pepcid May benefit from change in PPI therapy Follow-up in 3 months   Gelene Mink, PhD, ANP-BC [facility]

## 2023-03-30 NOTE — H&P (View-Only) (Signed)
  Gastroenterology Office Note     Primary Care Physician:  Simpson, Margaret E, MD  Primary Gastroenterologist: Dr. Carver    Chief Complaint   Chief Complaint  Patient presents with   Follow-up    Follow up on GERD, a lot of belching after that she is ok     History of Present Illness   Laura [name] is a 72 y.o. female presenting today in follow-up with a history of chronic abdominal pain, nocturnal flushing, weight loss, and work-up as outlined below negative for carcinoid, chronic constipation, GERD. Givens capsule in 2018 with gastritis.Last seen in 2023 with EGD recommended. This was postponed due to low potassium.   Returning now with continued dyspepsia. Epigastric pain, worsening reflux. Throat feels raw. Omeprazole BID chronically. Belching a lot in the morning. Has taken pantoprazole in the past. Pepcid daily. No dysphagia. Ate honeybun and chips before bed recently when asked if eating later at night. States not all the time. Goes to bed at 8. Drinks green tea. Avoids fried foods. Will take rare advil if tramadol doesn't help joint pain. Weight is stable.    Constipation resolved at this moment.   Colonoscopy 2022 : fair prep, hemorrhoids, diverticulosis, stool in entire colon but cleared with copious amounts of water.   EGD June 2021 with a few fundic gland polyps, otherwise unremarkable.  Biopsies did show chronic gastritis negative for H. pylori.   Additional workup has included:  CT June 2021 with no significant findings.  HIDA September 2021 with slightly low EF at 27%.  labs that had been completed included catecholamines,metanephrines, ESR, CRP, tryptase all of which were ok. 5 HIAA elevated at 13.6. TSH borderline low. CT A/P with and without contrast 10/06/20 with no acute findings.  NM PET with Dotatate 11/13/2020 with no evidence of well-differentiated neuroendocrine tumor within the abdomen or pelvis.  No evidence of metastatic disease. CTA: no  occlusive disease  Past Medical History:  Diagnosis Date   Anxiety    At low risk for fall 08/15/2018   Cystitis    Depression    Gastritis    GERD (gastroesophageal reflux disease)    Hashimoto's thyroiditis    Hx    Hyperlipidemia    Hypertension    Hypothyroidism    Normocytic anemia 2009 Hb 9.9-11.1   Osteopenia    Shoulder pain    Urinary incontinence     Past Surgical History:  Procedure Laterality Date   Bilateral tubal ligation     BIOPSY  04/21/2020   Procedure: BIOPSY;  Surgeon: Rourk, Robert M, MD;  Location: [facility];  Service: Endoscopy;;   BREAST EXCISIONAL BIOPSY  2010   Excisional biopsy of benign left breast mass -lopoma    BREAST SURGERY     left nreast biopsy   COLONOSCOPY  08/26/2011   SLF: 1. Internal hemorrhoids   COLONOSCOPY N/A 10/06/2015   SLF:anal fissure or internal hemorrhoids/mild sigmoid colitis. benign colonic path    COLONOSCOPY WITH PROPOFOL N/A 03/15/2021   Procedure: COLONOSCOPY WITH PROPOFOL;  Surgeon: Carver, Charles K, DO;  Location: [facility];  Service: Endoscopy;  Laterality: N/A;  3:00pm   colonscopy  2005   Dr. Smith   ESOPHAGOGASTRODUODENOSCOPY  12/2009   chronic gastritis   ESOPHAGOGASTRODUODENOSCOPY N/A 05/17/2017   Dr. Fields; gastritis, normal small bowel biopsies, multiple gastric polyps. fundic gland polyps   ESOPHAGOGASTRODUODENOSCOPY (EGD) WITH PROPOFOL N/A 04/21/2020   Rourk: multiple gastric polyps, biopsy of one c/w fundic gland.   gastric biopsy showed mild chronic gastritis but no H.pylori   GIVENS CAPSULE STUDY N/A 05/30/2017   gastritis, no source for anemia identified   left knee surgery Left March 1,2017   PARTIAL KNEE ARTHROPLASTY Left 08/09/2017   Procedure: LEFT UNICOMPARTMENTAL KNEE;  Surgeon: Aluisio, Frank, MD;  Location: [facility];  Service: Orthopedics;  Laterality: Left;   POLYPECTOMY  04/21/2020   Procedure: POLYPECTOMY;  Surgeon: Rourk, Robert M, MD;  Location: [facility];  Service: Endoscopy;;   gastric   Resection of left lobe of thyroid     right carpal tunnel release     rt. knee athroscopy  2004   SAVORY DILATION N/A 05/17/2017   Procedure: SAVORY DILATION;  Surgeon: Fields, Sandi L, MD;  Location: [facility];  Service: Endoscopy;  Laterality: N/A;   TOTAL ABDOMINAL HYSTERECTOMY  1994   UMBILICAL HERNIA REPAIR     Urethral dilation for stenosis  2009    Current Outpatient Medications  Medication Sig Dispense Refill   acetaminophen (TYLENOL) 500 MG tablet Take 1 tablet (500 mg total) by mouth every 6 (six) hours as needed. (Patient taking differently: Take 1,500 mg by mouth daily as needed for mild pain or headache.) 30 tablet 0   amLODipine (NORVASC) 5 MG tablet Take 1 tablet (5 mg total) by mouth daily. 30 tablet 3   atorvastatin (LIPITOR) 80 MG tablet TAKE 1 TABLET BY MOUTH ONCE  DAILY 90 tablet 3   buPROPion (WELLBUTRIN XL) 150 MG 24 hr tablet TAKE 1 TABLET(150 MG) BY MOUTH DAILY 30 tablet 2   Calcium Carbonate-Vit D-Min (CALCIUM 1200) 1200-1000 MG-UNIT CHEW Take one tablet by mouth two times daily 60 tablet 5   citalopram (CELEXA) 40 MG tablet TAKE 1 TABLET BY MOUTH DAILY 90 tablet 3   famotidine (PEPCID) 40 MG tablet Take 1 tablet (40 mg total) by mouth daily. As needed for heartburn/abdominal pain 30 tablet 5   fenofibrate (TRICOR) 48 MG tablet TAKE 1 TABLET BY MOUTH DAILY 90 tablet 3   furosemide (LASIX) 20 MG tablet TAKE 1 TABLET(20 MG) BY MOUTH DAILY 30 tablet 1   gabapentin (NEURONTIN) 100 MG capsule Take two capsules at bedtime for neuropathic pain 100 capsule 3   hydrochlorothiazide (HYDRODIURIL) 25 MG tablet TAKE 1 TABLET BY MOUTH DAILY 100 tablet 2   levothyroxine (SYNTHROID) 88 MCG tablet TAKE 1 TABLET BY MOUTH  DAILY BEFORE BREAKFAST 90 tablet 3   montelukast (SINGULAIR) 10 MG tablet Take 1 tablet (10 mg total) by mouth at bedtime. 30 tablet 3   Multiple Vitamin (MULTIVITAMIN WITH MINERALS) TABS tablet Take 1 tablet by mouth daily.     omeprazole (PRILOSEC)  20 MG capsule TAKE 1 CAPSULE(20 MG) BY MOUTH TWICE DAILY BEFORE A MEAL 180 capsule 3   Polyethyl Glycol-Propyl Glycol (SYSTANE OP) Place 1 drop into both eyes daily as needed (dry eyes).     potassium chloride SA (KLOR-CON M) 20 MEQ tablet Take two tablets three times daily for 5 days , then take two tablets twice daily 60 tablet 0   rosuvastatin (CRESTOR) 40 MG tablet TAKE 1 TABLET(40 MG) BY MOUTH DAILY 90 tablet 0   traMADol (ULTRAM) 50 MG tablet Take 1 tablet (50 mg total) by mouth every 8 (eight) hours. 20 tablet 0   amLODipine (NORVASC) 2.5 MG tablet TAKE 1 TABLET BY MOUTH DAILY (Patient not taking: Reported on 03/30/2023) 90 tablet 3   Lactobacillus Rhamnosus, GG, (CULTURELLE PO) Take by mouth daily. (Patient not taking: Reported   on 03/30/2023)     Current Facility-Administered Medications  Medication Dose Route Frequency Provider Last Rate Last Admin   bupivacaine (PF) (MARCAINE) 0.25 % injection 8 mL  8 mL Other Once Steen, James J, MD       lidocaine (PF) (XYLOCAINE) 1 % injection 2 mL  2 mL Other Once Steen, James J, MD       triamcinolone acetonide (KENALOG-40) injection 40 mg  40 mg Intra-articular Once Steen, James J, MD       triamcinolone acetonide (KENALOG-40) injection 40 mg  40 mg Intra-articular Once Steen, James J, MD        Allergies as of 03/30/2023 - Review Complete 03/30/2023  Allergen Reaction Noted   Cozaar [losartan potassium] Cough 07/04/2018   Dilaudid [hydromorphone hcl] Other (See Comments) 03/01/2011   Levaquin [levofloxacin] Other (See Comments) 07/28/2014   Nsaids Other (See Comments) 01/13/2010   Promethazine  05/17/2017    Family History  Problem Relation Age of Onset   Pneumonia Mother    Healthy Father    Colon cancer Neg Hx    Anesthesia problems Neg Hx    Hypotension Neg Hx    Malignant hyperthermia Neg Hx    Pseudochol deficiency Neg Hx     Social History   Socioeconomic History   Marital status: Married    Spouse name: Not on file    Number of children: 3   Years of education: Not on file   Highest education level: Not on file  Occupational History   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   Tobacco Use   Smoking status: Former    Packs/day: 0.25    Years: 1.00    Additional pack years: 0.00    Total pack years: 0.25    Types: Cigarettes    Quit date: 08/25/1972    Years since quitting: 50.6    Passive exposure: Past   Smokeless tobacco: Never  Vaping Use   Vaping Use: Never used  Substance and Sexual Activity   Alcohol use: No   Drug use: No   Sexual activity: Not Currently    Birth control/protection: Surgical    Comment: hyst  Other Topics Concern   Not on file  Social History Narrative   Not on file   Social Determinants of Health   Financial Resource Strain: Low Risk  (08/30/2021)   Overall Financial Resource Strain (CARDIA)    Difficulty of Paying Living Expenses: Not hard at all  Food Insecurity: No Food Insecurity (08/30/2021)   Hunger Vital Sign    Worried About Running Out of Food in the Last Year: Never true    Ran Out of Food in the Last Year: Never true  Transportation Needs: No Transportation Needs (08/30/2021)   PRAPARE - Transportation    Lack of Transportation (Medical): No    Lack of Transportation (Non-Medical): No  Physical Activity: Inactive (08/30/2021)   Exercise Vital Sign    Days of Exercise per Week: 0 days    Minutes of Exercise per Session: 0 min  Stress: No Stress Concern Present (08/30/2021)   [employer] - Occupational Stress Questionnaire    Feeling of Stress : Not at all  Social Connections: Moderately Integrated (08/30/2021)   Social Connection and Isolation Panel [NHANES]    Frequency of Communication with Friends and Family: More than three times a week    Frequency of Social Gatherings with Friends and Family: Twice a week    Attends Religious Services:   More than 4 times per year    Active Member of Clubs or  Organizations: No    Attends Club or Organization Meetings: Never    Marital Status: Married  Intimate Partner Violence: Not At Risk (08/30/2021)   Humiliation, Afraid, Rape, and Kick questionnaire    Fear of Current or Ex-Partner: No    Emotionally Abused: No    Physically Abused: No    Sexually Abused: No     Review of Systems   Gen: Denies any fever, chills, fatigue, weight loss, lack of appetite.  CV: Denies chest pain, heart palpitations, peripheral edema, syncope.  Resp: Denies shortness of breath at rest or with exertion. Denies wheezing or cough.  GI: Denies dysphagia or odynophagia. Denies jaundice, hematemesis, fecal incontinence. GU : Denies urinary burning, urinary frequency, urinary hesitancy MS: Denies joint pain, muscle weakness, cramps, or limitation of movement.  Derm: Denies rash, itching, dry skin Psych: Denies depression, anxiety, memory loss, and confusion Heme: Denies bruising, bleeding, and enlarged lymph nodes.   Physical Exam   BP 117/73   Pulse 66   Temp 97.9 F (36.6 C)   Ht 5' 8" (1.727 m)   Wt 169 lb 3.2 oz (76.7 kg)   BMI 25.73 kg/m  General:   Alert and oriented. Pleasant and cooperative. Well-nourished and well-developed.  Head:  Normocephalic and atraumatic. Eyes:  Without icterus Cardiac: S1 S2 without obvious murmurs Lungs: CTAB Abdomen:  +BS, soft, non-tender and non-distended. No HSM noted. No guarding or rebound. No masses appreciated.  Rectal:  Deferred  Msk:  Symmetrical without gross deformities. Normal posture. Extremities:  Without edema. Neurologic:  Alert and  oriented x4;  grossly normal neurologically. Skin:  Intact without significant lesions or rashes. Psych:  Alert and cooperative. Normal mood and affect.   Assessment   Laura [name] is a 72 y.o. female presenting today in follow-up with a history of chronic abdominal pain, chronic constipation  but now resolved, GERD, with persistent dyspepsia.   Noting epigastric  pain and reflux last year and had recommended updated EGD (las in 2021 with fundic gland polyps, negative H.pylori). This was canceled due to hypokalemia.   Will arrange EGD in near future. GERD exacerbation may be multifactorial in light of diet/behavior; however, she seems for most part to employ behavior modification. Difficult historian. Continue omeprazole BID and Pepcid prn. May need to change PPI (has been on pantoprazole in past). She wants to stay on omeprazole till EGD. Also of note, she has a history of chronic abdominal pain, with negative evaluation as noted in HPI and CTA last year unrevealing. May benefit from TCA in future.   PLAN    Proceed with upper endoscopy by Dr. Carver in near future: the risks, benefits, and alternatives have been discussed with the patient in detail. The patient states understanding and desires to proceed.  Continue omeprazole BID and Pepcid May benefit from change in PPI therapy Follow-up in 3 months   Roddrick Sharron W. Sharmin Foulk, PhD, ANP-BC [facility]    

## 2023-03-30 NOTE — Telephone Encounter (Signed)
LMOVM to call back to schedule EGD with Dr. Carver, ASA 3 

## 2023-04-03 DIAGNOSIS — I10 Essential (primary) hypertension: Secondary | ICD-10-CM | POA: Diagnosis not present

## 2023-04-03 DIAGNOSIS — E890 Postprocedural hypothyroidism: Secondary | ICD-10-CM | POA: Diagnosis not present

## 2023-04-03 DIAGNOSIS — E782 Mixed hyperlipidemia: Secondary | ICD-10-CM | POA: Diagnosis not present

## 2023-04-04 LAB — LIPID PANEL
Chol/HDL Ratio: 3.2 ratio (ref 0.0–4.4)
Cholesterol, Total: 204 mg/dL — ABNORMAL HIGH (ref 100–199)
HDL: 63 mg/dL (ref >39)
LDL Chol Calc (NIH): 121 mg/dL — ABNORMAL HIGH (ref 0–99)
Triglycerides: 113 mg/dL (ref 0–149)
VLDL Cholesterol Cal: 20 mg/dL (ref 5–40)

## 2023-04-04 LAB — CMP14+EGFR
ALT: 15 IU/L (ref 0–32)
AST: 19 IU/L (ref 0–40)
Albumin/Globulin Ratio: 1.6 (ref 1.2–2.2)
Albumin: 4 g/dL (ref 3.8–4.8)
Alkaline Phosphatase: 82 IU/L (ref 44–121)
BUN/Creatinine Ratio: 11 — ABNORMAL LOW (ref 12–28)
BUN: 10 mg/dL (ref 8–27)
Bilirubin Total: 0.4 mg/dL (ref 0.0–1.2)
CO2: 25 mmol/L (ref 20–29)
Calcium: 9.9 mg/dL (ref 8.7–10.3)
Chloride: 100 mmol/L (ref 96–106)
Creatinine, Ser: 0.91 mg/dL (ref 0.57–1.00)
Globulin, Total: 2.5 g/dL (ref 1.5–4.5)
Glucose: 98 mg/dL (ref 70–99)
Potassium: 3.9 mmol/L (ref 3.5–5.2)
Sodium: 139 mmol/L (ref 134–144)
Total Protein: 6.5 g/dL (ref 6.0–8.5)
eGFR: 67 mL/min/{1.73_m2} (ref >59)

## 2023-04-04 LAB — T4, FREE: Free T4: 1.24 ng/dL (ref 0.82–1.77)

## 2023-04-04 LAB — TSH: TSH: 0.81 u[IU]/mL (ref 0.450–4.500)

## 2023-04-07 ENCOUNTER — Telehealth: Payer: Self-pay | Admitting: Family Medicine

## 2023-04-07 NOTE — Telephone Encounter (Signed)
Patient came by the office to get a copy of her blood work, but the provider has not resulted yet, nurse will call  patient with the results.

## 2023-04-11 NOTE — Telephone Encounter (Signed)
Patient aware.

## 2023-04-17 NOTE — Patient Instructions (Signed)
Laura [name]  04/17/2023     @PREFPERIOPPHARMACY @   Your procedure is scheduled on  04/20/2023.   Report to [name] at  1115 A.M.   Call this number if you have problems the morning of surgery:  [phone]  If you experience any cold or flu symptoms such as cough, fever, chills, shortness of breath, etc. between now and your scheduled surgery, please notify us at the above number.   Remember:  Follow the diet instructions given to you by the office.     Take these medicines the morning of surgery with A SIP OF WATER          amlodipine, buproprion, citalapram, famotidine, levothyroxine, omeprazole, tramadol.     Do not wear jewelry, make-up or nail polish, including gel polish,  artificial nails, or any other type of covering on natural nails (fingers and  toes).  Do not wear lotions, powders, or perfumes, or deodorant.  Do not shave 48 hours prior to surgery.  Men may shave face and neck.  Do not bring valuables to the hospital.  [facility] is not responsible for any belongings or valuables.  Contacts, dentures or bridgework may not be worn into surgery.  Leave your suitcase in the car.  After surgery it may be brought to your room.  For patients admitted to the hospital, discharge time will be determined by your treatment team.  Patients discharged the day of surgery will not be allowed to drive home and must have someone with them for 24 hours.    Special instructions:   DO NOT smoke tobacco or vape for 24 hours before your procedure.  Please read over the following fact sheets that you were given. Anesthesia Post-op Instructions and Care and Recovery After Surgery      Upper Endoscopy, Adult, Care After After the procedure, it is common to have a sore throat. It is also common to have: Mild stomach pain or discomfort. Bloating. Nausea. Follow these instructions at home: The instructions below may help you care for yourself at home. Your health  care provider may give you more instructions. If you have questions, ask your health care provider. If you were given a sedative during the procedure, it can affect you for several hours. Do not drive or operate machinery until your health care provider says that it is safe. If you will be going home right after the procedure, plan to have a responsible adult: Take you home from the hospital or clinic. You will not be allowed to drive. Care for you for the time you are told. Follow instructions from your health care provider about what you may eat and drink. Return to your normal activities as told by your health care provider. Ask your health care provider what activities are safe for you. Take over-the-counter and prescription medicines only as told by your health care provider. Contact a health care provider if you: Have a sore throat that lasts longer than one day. Have trouble swallowing. Have a fever. Get help right away if you: Vomit blood or your vomit looks like coffee grounds. Have bloody, black, or tarry stools. Have a very bad sore throat or you cannot swallow. Have difficulty breathing or very bad pain in your chest or abdomen. These symptoms may be an emergency. Get help right away. Call 911. Do not wait to see if the symptoms will go away. Do not drive yourself to the hospital. Summary After the  procedure, it is common to have a sore throat, mild stomach discomfort, bloating, and nausea. If you were given a sedative during the procedure, it can affect you for several hours. Do not drive until your health care provider says that it is safe. Follow instructions from your health care provider about what you may eat and drink. Return to your normal activities as told by your health care provider. This information is not intended to replace advice given to you by your health care provider. Make sure you discuss any questions you have with your health care provider. Document  Revised: 01/26/2022 Document Reviewed: 01/26/2022 Elsevier Patient Education  2024 [employer] Monitored Anesthesia Care, Care After The following information offers guidance on how to care for yourself after your procedure. Your health care provider may also give you more specific instructions. If you have problems or questions, contact your health care provider. What can I expect after the procedure? After the procedure, it is common to have: Tiredness. Little or no memory about what happened during or after the procedure. Impaired judgment when it comes to making decisions. Nausea or vomiting. Some trouble with balance. Follow these instructions at home: For the time period you were told by your health care provider:  Rest. Do not participate in activities where you could fall or become injured. Do not drive or use machinery. Do not drink alcohol. Do not take sleeping pills or medicines that cause drowsiness. Do not make important decisions or sign legal documents. Do not take care of children on your own. Medicines Take over-the-counter and prescription medicines only as told by your health care provider. If you were prescribed antibiotics, take them as told by your health care provider. Do not stop using the antibiotic even if you start to feel better. Eating and drinking Follow instructions from your health care provider about what you may eat and drink. Drink enough fluid to keep your urine pale yellow. If you vomit: Drink clear fluids slowly and in small amounts as you are able. Clear fluids include water, ice chips, low-calorie sports drinks, and fruit juice that has water added to it (diluted fruit juice). Eat light and bland foods in small amounts as you are able. These foods include bananas, applesauce, rice, lean meats, toast, and crackers. General instructions  Have a responsible adult stay with you for the time you are told. It is important to have someone help care  for you until you are awake and alert. If you have sleep apnea, surgery and some medicines can increase your risk for breathing problems. Follow instructions from your health care provider about wearing your sleep device: When you are sleeping. This includes during daytime naps. While taking prescription pain medicines, sleeping medicines, or medicines that make you drowsy. Do not use any products that contain nicotine or tobacco. These products include cigarettes, chewing tobacco, and vaping devices, such as e-cigarettes. If you need help quitting, ask your health care provider. Contact a health care provider if: You feel nauseous or vomit every time you eat or drink. You feel light-headed. You are still sleepy or having trouble with balance after 24 hours. You get a rash. You have a fever. You have redness or swelling around the IV site. Get help right away if: You have trouble breathing. You have new confusion after you get home. These symptoms may be an emergency. Get help right away. Call 911. Do not wait to see if the symptoms will go away. Do not drive yourself  to the hospital. This information is not intended to replace advice given to you by your health care provider. Make sure you discuss any questions you have with your health care provider. Document Revised: 03/14/2022 Document Reviewed: 03/14/2022 Elsevier Patient Education  2024 [employer].

## 2023-04-18 ENCOUNTER — Encounter (HOSPITAL_COMMUNITY)
Admission: RE | Admit: 2023-04-18 | Discharge: 2023-04-18 | Disposition: A | Discharge status: Home / Self Care | Payer: Medicare Other | Source: Ambulatory Visit | Attending: Internal Medicine | Admitting: Internal Medicine

## 2023-04-18 ENCOUNTER — Encounter: Payer: Self-pay | Admitting: "Endocrinology

## 2023-04-18 ENCOUNTER — Ambulatory Visit: Payer: Medicare Other | Admitting: "Endocrinology

## 2023-04-18 VITALS — BP 112/66 | HR 58 | Temp 97.0°F | Resp 18 | Ht 63.0 in | Wt 166.6 lb

## 2023-04-18 VITALS — BP 112/66 | HR 68 | Ht 63.0 in | Wt 166.6 lb

## 2023-04-18 DIAGNOSIS — I10 Essential (primary) hypertension: Secondary | ICD-10-CM

## 2023-04-18 DIAGNOSIS — E782 Mixed hyperlipidemia: Secondary | ICD-10-CM

## 2023-04-18 DIAGNOSIS — E890 Postprocedural hypothyroidism: Secondary | ICD-10-CM | POA: Diagnosis not present

## 2023-04-18 DIAGNOSIS — D649 Anemia, unspecified: Secondary | ICD-10-CM

## 2023-04-18 DIAGNOSIS — R7303 Prediabetes: Secondary | ICD-10-CM

## 2023-04-18 LAB — CBC WITH DIFFERENTIAL/PLATELET
Abs Immature Granulocytes: 0.01 10*3/uL (ref 0.00–0.07)
Basophils Absolute: 0 10*3/uL (ref 0.0–0.1)
Basophils Relative: 1 %
Eosinophils Absolute: 0.1 10*3/uL (ref 0.0–0.5)
Eosinophils Relative: 2 %
HCT: 35.7 % — ABNORMAL LOW (ref 36.0–46.0)
Hemoglobin: 11.3 g/dL — ABNORMAL LOW (ref 12.0–15.0)
Immature Granulocytes: 0 %
Lymphocytes Relative: 34 %
Lymphs Abs: 1.8 10*3/uL (ref 0.7–4.0)
MCH: 28.1 pg (ref 26.0–34.0)
MCHC: 31.7 g/dL (ref 30.0–36.0)
MCV: 88.8 fL (ref 80.0–100.0)
Monocytes Absolute: 0.5 10*3/uL (ref 0.1–1.0)
Monocytes Relative: 10 %
Neutro Abs: 2.9 10*3/uL (ref 1.7–7.7)
Neutrophils Relative %: 53 %
Platelets: 278 10*3/uL (ref 150–400)
RBC: 4.02 MIL/uL (ref 3.87–5.11)
RDW: 12.2 % (ref 11.5–15.5)
WBC: 5.3 10*3/uL (ref 4.0–10.5)
nRBC: 0 % (ref 0.0–0.2)

## 2023-04-18 NOTE — Patient Instructions (Signed)
                                     Advice for Weight Management  -For most of us the best way to lose weight is by diet management. Generally speaking, diet management means consuming less calories intentionally which over time brings about progressive weight loss.  This can be achieved more effectively by avoiding ultra processed carbohydrates, processed meats, unhealthy fats.    It is critically important to know your numbers: how much calorie you are consuming and how much calorie you need. More importantly, our carbohydrates sources should be unprocessed naturally occurring  complex starch food items.  It is always important to balance nutrition also by  appropriate intake of proteins (mainly plant-based), healthy fats/oils, plenty of fruits and vegetables.   -The American College of Lifestyle Medicine (ACL M) recommends nutrition derived mostly from Whole Food, Plant Predominant Sources example an apple instead of applesauce or apple pie. Eat Plenty of vegetables, Mushrooms, fruits, Legumes, Whole Grains, Nuts, seeds in lieu of processed meats, processed snacks/pastries red meat, poultry, eggs.  Use only water or unsweetened tea for hydration.  The College also recommends the need to stay away from risky substances including alcohol, smoking; obtaining 7-9 hours of restorative sleep, at least 150 minutes of moderate intensity exercise weekly, importance of healthy social connections, and being mindful of stress and seek help when it is overwhelming.    -Sticking to a routine mealtime to eat 3 meals a day and avoiding unnecessary snacks is shown to have a big role in weight control. Under normal circumstances, the only time we burn stored energy is when we are hungry, so allow  some hunger to take place- hunger means no food between appropriate meal times, only water.  It is not advisable to starve.   -It is better to avoid simple carbohydrates including:  Cakes, Sweet Desserts, Ice Cream, Soda (diet and regular), Sweet Tea, Candies, Chips, Cookies, Store Bought Juices, Alcohol in Excess of  1-2 drinks a day, Lemonade,  Artificial Sweeteners, Doughnuts, Coffee Creamers, "Sugar-free" Products, etc, etc.  This is not a complete list.....    -Consulting with certified diabetes educators is proven to provide you with the most accurate and current information on diet.  Also, you may be  interested in discussing diet options/exchanges , we can schedule a visit with Laura [name], RDN, CDE for individualized nutrition education.  -Exercise: If you are able: 30 -60 minutes a day ,4 days a week, or 150 minutes of moderate intensity exercise weekly.    The longer the better if tolerated.  Combine stretch, strength, and aerobic activities.  If you were told in the past that you have high risk for cardiovascular diseases, or if you are currently symptomatic, you may seek evaluation by your heart doctor prior to initiating moderate to intense exercise programs.                                  Additional Care Considerations for Diabetes/Prediabetes   -Diabetes  is a chronic disease.  The most important care consideration is regular follow-up with your diabetes care provider with the goal being avoiding or delaying its complications and to take advantage of advances in medications and technology.  If appropriate actions are taken early enough, type 2 diabetes can even be   reversed.  Seek information from the right source.  - Whole Food, Plant Predominant Nutrition is highly recommended: Eat Plenty of vegetables, Mushrooms, fruits, Legumes, Whole Grains, Nuts, seeds in lieu of processed meats, processed snacks/pastries red meat, poultry, eggs as recommended by American College of  Lifestyle Medicine (ACLM).  -Type 2 diabetes is known to coexist with other important comorbidities such as high blood pressure and high cholesterol.  It is critical to control not only the  diabetes but also the high blood pressure and high cholesterol to minimize and delay the risk of complications including coronary artery disease, stroke, amputations, blindness, etc.  The good news is that this diet recommendation for type 2 diabetes is also very helpful for managing high cholesterol and high blood blood pressure.  - Studies showed that people with diabetes will benefit from a class of medications known as ACE inhibitors and statins.  Unless there are specific reasons not to be on these medications, the standard of care is to consider getting one from these groups of medications at an optimal doses.  These medications are generally considered safe and proven to help protect the heart and the kidneys.    - People with diabetes are encouraged to initiate and maintain regular follow-up with eye doctors, foot doctors, dentists , and if necessary heart and kidney doctors.     - It is highly recommended that people with diabetes quit smoking or stay away from smoking, and get yearly  flu vaccine and pneumonia vaccine at least every 5 years.  See above for additional recommendations on exercise, sleep, stress management , and healthy social connections.      

## 2023-04-18 NOTE — Progress Notes (Signed)
04/18/2023  Endocrinology follow-up note  Complaint: Follow-up for hypothyroidism  HPI  Laura [name] is a 72 y.o.-year-old female,  She has  medical history of goiter status post left hemithyroidectomy 20+ years ago, unremarkable thyroid/neck ultrasound in 2012.  She is currently on levothyroxine 88 mcg p.o. daily before breakfast.    Her pre-visit thyroid function tests are consistent with appropriate replacement .she has no new complaints today.   She has hyperlipidemia, currently on Crestor 40 mg p.o. daily.  She is fenofibrate 48 mg p.o. daily.  However, her previsit labs show uncontrolled lipid panel.  -  She denies any heat/cold intolerance, palpitations, tremors.    Pt denies feeling nodules in neck, hoarseness, dysphagia/odynophagia, SOB with lying down.  No history of radiation exposure to her head and neck.   No recent use of iodine supplements.   ROS: Constitutional: + steady weight, no fatigue, no subjective hyperthermia nor hypothermia.     PE: BP 112/66   Pulse 68   Ht 5\' 3"  (1.6 m)   Wt 166 lb 9.6 oz (75.6 kg)   BMI 29.51 kg/m  Wt Readings from Last 3 Encounters:  04/18/23 166 lb 9.6 oz (75.6 kg)  04/18/23 166 lb 9.6 oz (75.6 kg)  03/30/23 169 lb 3.2 oz (76.7 kg)    Physical Exam- Limited  Constitutional:  Body mass index is 29.51 kg/m. , not in acute distress, normal state of mind    Recent Results (from the past 2160 hour(s))  Lipid panel     Status: Abnormal   Collection Time: 04/03/23 10:20 AM  Result Value Ref Range   Cholesterol, Total 204 (H) 100 - 199 mg/dL   Triglycerides 161 0 - 149 mg/dL   HDL 63 >09 mg/dL   VLDL Cholesterol Cal 20 5 - 40 mg/dL   LDL Chol Calc (NIH) 604 (H) 0 - 99 mg/dL   Chol/HDL Ratio 3.2 0.0 - 4.4 ratio    Comment:                                   T. Chol/HDL Ratio                                             Men  Women                               1/2 Avg.Risk  3.4    3.3                                   Avg.Risk   5.0    4.4                                2X Avg.Risk  9.6    7.1                                3X Avg.Risk 23.4   11.0   T4, free     Status: None   Collection Time: 04/03/23 10:20 AM  Result Value Ref Range  Free T4 1.24 0.82 - 1.77 ng/dL  TSH     Status: None   Collection Time: 04/03/23 10:20 AM  Result Value Ref Range   TSH 0.810 0.450 - 4.500 uIU/mL  CMP14+EGFR     Status: Abnormal   Collection Time: 04/03/23 10:22 AM  Result Value Ref Range   Glucose 98 70 - 99 mg/dL   BUN 10 8 - 27 mg/dL   Creatinine, Ser 1.61 0.57 - 1.00 mg/dL   eGFR 67 >09 UE/AVW/0.98   BUN/Creatinine Ratio 11 (L) 12 - 28   Sodium 139 134 - 144 mmol/L   Potassium 3.9 3.5 - 5.2 mmol/L   Chloride 100 96 - 106 mmol/L   CO2 25 20 - 29 mmol/L   Calcium 9.9 8.7 - 10.3 mg/dL   Total Protein 6.5 6.0 - 8.5 g/dL   Albumin 4.0 3.8 - 4.8 g/dL   Globulin, Total 2.5 1.5 - 4.5 g/dL   Albumin/Globulin Ratio 1.6 1.2 - 2.2   Bilirubin Total 0.4 0.0 - 1.2 mg/dL   Alkaline Phosphatase 82 44 - 121 IU/L   AST 19 0 - 40 IU/L   ALT 15 0 - 32 IU/L   ASSESSMENT: 1. Hypothyroidism, postsurgical 2.  Hyperlipidemia  PLAN:  -Her previsit thyroid function tests are consistent with appropriate replacement.  She is advised to continue levothyroxine 88 mcg p.o. daily before breakfast.    - We discussed about the correct intake of her thyroid hormone, on empty stomach at fasting, with water, separated by at least 30 minutes from breakfast and other medications,  and separated by more than 4 hours from calcium, iron, multivitamins, acid reflux medications (PPIs). -Patient is made aware of the fact that thyroid hormone replacement is needed for life, dose to be adjusted by periodic monitoring of thyroid function tests.  -Her surveillance thyroid/neck ultrasound was unremarkable in 2012. Her previsit labs show worsening lipid panel total LDL 121.  She is advised to continue Crestor 40 mg p.o. nightly along with her fenofibrate 48  mg p.o. daily.  Side effects and precautions discussed with her.  She would also benefit from whole food plant-based diet which was reviewed discussed with her. Her recent POC  A1c is 5.4%, improving from prior measurements consistent with prediabetes. -Her screening bone density from October 2019 was normal.    She is advised to maintain close follow up with her PMD for Primary care needs.   I spent  21  minutes in the care of the patient today including review of labs from Thyroid Function, CMP, and other relevant labs ; imaging/biopsy records (current and previous including abstractions from other facilities); face-to-face time discussing  her lab results and symptoms, medications doses, her options of short and long term treatment based on the latest standards of care / guidelines;   and documenting the encounter.  Laura [name]  participated in the discussions, expressed understanding, and voiced agreement with the above plans.  All questions were answered to her satisfaction. she is encouraged to contact clinic should she have any questions or concerns prior to her return visit.    Laura Lunch, MD Phone: [phone]  Fax: [phone]   04/18/2023, 2:21 PM

## 2023-04-19 ENCOUNTER — Other Ambulatory Visit: Payer: Self-pay | Admitting: Family Medicine

## 2023-04-20 ENCOUNTER — Encounter (HOSPITAL_COMMUNITY): Payer: Self-pay

## 2023-04-20 ENCOUNTER — Ambulatory Visit (HOSPITAL_COMMUNITY)
Admission: RE | Admit: 2023-04-20 | Discharge: 2023-04-20 | Disposition: A | Discharge status: Home / Self Care | Payer: Medicare Other | Attending: Internal Medicine | Admitting: Internal Medicine

## 2023-04-20 ENCOUNTER — Ambulatory Visit (HOSPITAL_COMMUNITY): Payer: Medicare Other | Admitting: Anesthesiology

## 2023-04-20 ENCOUNTER — Encounter (HOSPITAL_COMMUNITY)
Admission: RE | Disposition: A | Discharge status: Home / Self Care | Payer: Self-pay | Source: Home / Self Care | Attending: Internal Medicine

## 2023-04-20 ENCOUNTER — Ambulatory Visit (HOSPITAL_BASED_OUTPATIENT_CLINIC_OR_DEPARTMENT_OTHER): Payer: Medicare Other | Admitting: Anesthesiology

## 2023-04-20 DIAGNOSIS — K2970 Gastritis, unspecified, without bleeding: Secondary | ICD-10-CM | POA: Diagnosis not present

## 2023-04-20 DIAGNOSIS — D132 Benign neoplasm of duodenum: Secondary | ICD-10-CM

## 2023-04-20 DIAGNOSIS — Z87891 Personal history of nicotine dependence: Secondary | ICD-10-CM | POA: Diagnosis not present

## 2023-04-20 DIAGNOSIS — F419 Anxiety disorder, unspecified: Secondary | ICD-10-CM | POA: Diagnosis not present

## 2023-04-20 DIAGNOSIS — K5730 Diverticulosis of large intestine without perforation or abscess without bleeding: Secondary | ICD-10-CM | POA: Insufficient documentation

## 2023-04-20 DIAGNOSIS — K219 Gastro-esophageal reflux disease without esophagitis: Secondary | ICD-10-CM | POA: Diagnosis not present

## 2023-04-20 DIAGNOSIS — K2950 Unspecified chronic gastritis without bleeding: Secondary | ICD-10-CM | POA: Insufficient documentation

## 2023-04-20 DIAGNOSIS — K30 Functional dyspepsia: Secondary | ICD-10-CM

## 2023-04-20 DIAGNOSIS — I10 Essential (primary) hypertension: Secondary | ICD-10-CM

## 2023-04-20 DIAGNOSIS — E039 Hypothyroidism, unspecified: Secondary | ICD-10-CM | POA: Insufficient documentation

## 2023-04-20 DIAGNOSIS — E785 Hyperlipidemia, unspecified: Secondary | ICD-10-CM | POA: Insufficient documentation

## 2023-04-20 DIAGNOSIS — Z79890 Hormone replacement therapy (postmenopausal): Secondary | ICD-10-CM | POA: Diagnosis not present

## 2023-04-20 DIAGNOSIS — F32A Depression, unspecified: Secondary | ICD-10-CM | POA: Insufficient documentation

## 2023-04-20 DIAGNOSIS — E063 Autoimmune thyroiditis: Secondary | ICD-10-CM | POA: Insufficient documentation

## 2023-04-20 DIAGNOSIS — M8580 Other specified disorders of bone density and structure, unspecified site: Secondary | ICD-10-CM | POA: Diagnosis not present

## 2023-04-20 DIAGNOSIS — K317 Polyp of stomach and duodenum: Secondary | ICD-10-CM

## 2023-04-20 DIAGNOSIS — Z79899 Other long term (current) drug therapy: Secondary | ICD-10-CM | POA: Diagnosis not present

## 2023-04-20 DIAGNOSIS — R1013 Epigastric pain: Secondary | ICD-10-CM

## 2023-04-20 HISTORY — PX: BIOPSY: SHX5522

## 2023-04-20 HISTORY — PX: ESOPHAGOGASTRODUODENOSCOPY (EGD) WITH PROPOFOL: SHX5813

## 2023-04-20 SURGERY — ESOPHAGOGASTRODUODENOSCOPY (EGD) WITH PROPOFOL
Anesthesia: General

## 2023-04-20 MED ORDER — LIDOCAINE HCL (CARDIAC) PF 100 MG/5ML IV SOSY
PREFILLED_SYRINGE | INTRAVENOUS | Status: DC | PRN
  Administered 2023-04-20: 50 mg via INTRAVENOUS

## 2023-04-20 MED ORDER — LACTATED RINGERS IV SOLN: INTRAVENOUS | Status: DC

## 2023-04-20 MED ORDER — PROPOFOL 10 MG/ML IV BOLUS
INTRAVENOUS | Status: DC | PRN
  Administered 2023-04-20: 90 mg via INTRAVENOUS
  Administered 2023-04-20: 30 mg via INTRAVENOUS
  Administered 2023-04-20: 20 mg via INTRAVENOUS

## 2023-04-20 NOTE — Op Note (Signed)
[facility] Patient Name: Laura [name] Procedure Date: 04/20/2023 8:25 AM MRN: [id number] Date of Birth: 1951-10-23 Attending MD: Hennie Duos. Marletta Lor , [place], [id number] CSN: [id number] Age: 72 Admit Type: Outpatient Procedure:                Upper GI endoscopy Indications:              Functional Dyspepsia Providers:                Hennie Duos. Marletta Lor, DO, Buel Ream. Thomasena Edis RN, RN,                            Zena Amos Referring MD:              Medicines:                See the Anesthesia note for documentation of the                            administered medications Complications:            No immediate complications. Estimated Blood Loss:     Estimated blood loss was minimal. Procedure:                Pre-Anesthesia Assessment:                           - The anesthesia plan was to use monitored                            anesthesia care (MAC).                           After obtaining informed consent, the endoscope was                            passed under direct vision. Throughout the                            procedure, the patient's blood pressure, pulse, and                            oxygen saturations were monitored continuously. The                            [id number]-H190 ([id number]) scope was introduced through the                            mouth, and advanced to the second part of duodenum.                            The upper GI endoscopy was accomplished without                            difficulty. The patient tolerated the procedure                            well. Scope In: 8:30:16  AM Scope Out: 8:36:12 AM Total Procedure Duration: 0 hours 5 minutes 56 seconds  Findings:      The examined esophagus was normal.      One 10 mm sessile polyp with no bleeding was found in the third portion       of the duodenum. Biopsies were taken with a cold forceps for histology.      Patchy mild inflammation characterized by erythema was found in the       gastric body.  Biopsies were taken with a cold forceps for Helicobacter       pylori testing. **OF NOTE, 2 PIECES OF GASTRIC MUCOSA WERE INADVERTENTLY       PUT INOT JAR 1 WITH DUDOENAL POLYP BIOPSIES** Impression:               - Normal esophagus.                           - One duodenal polyp. Biopsied.                           - Gastritis. Biopsied. Moderate Sedation:      Per Anesthesia Care Recommendation:           - Patient has a contact number available for                            emergencies. The signs and symptoms of potential                            delayed complications were discussed with the                            patient. Return to normal activities tomorrow.                            Written discharge instructions were provided to the                            patient.                           - Resume previous diet.                           - Continue present medications.                           - Await pathology results.                           - Return to GI clinic in 3 months. Procedure Code(s):        --- Professional ---                           [phone], Esophagogastroduodenoscopy, flexible,                            transoral; with biopsy, single or multiple Diagnosis Code(s):        ---  Professional ---                           K31.7, Polyp of stomach and duodenum                           K29.70, Gastritis, unspecified, without bleeding                           K30, Functional dyspepsia CPT copyright 2022 American Medical Association. All rights reserved. The codes documented in this report are preliminary and upon coder review may  be revised to meet current compliance requirements. Hennie Duos. Marletta Lor, DO Hennie Duos. Marletta Lor, DO 04/20/2023 8:39:29 AM This report has been signed electronically. Number of Addenda: 0

## 2023-04-20 NOTE — Interval H&P Note (Signed)
History and Physical Interval Note:  04/20/2023 8:23 AM  [name]  has presented today for surgery, with the diagnosis of dyspepsia.  The various methods of treatment have been discussed with the patient and family. After consideration of risks, benefits and other options for treatment, the patient has consented to  Procedure(s) with comments: ESOPHAGOGASTRODUODENOSCOPY (EGD) WITH PROPOFOL (N/A) - 1:00 pm, asa 3, pt knows to arrive at 6:30 as a surgical intervention.  The patient's history has been reviewed, patient examined, no change in status, stable for surgery.  I have reviewed the patient's chart and labs.  Questions were answered to the patient's satisfaction.     [name]

## 2023-04-20 NOTE — Anesthesia Preprocedure Evaluation (Signed)
Anesthesia Evaluation  Patient identified by MRN, date of birth, ID band Patient awake    Reviewed: Allergy & Precautions, H&P , NPO status , Patient's Chart, lab work & pertinent test results  Airway Mallampati: II  TM Distance: >3 FB Neck ROM: Full    Dental  (+) Missing, Caps, Dental Advisory Given   Pulmonary neg pulmonary ROS, former smoker   Pulmonary exam normal breath sounds clear to auscultation       Cardiovascular Exercise Tolerance: Good METS (Unsteady gait when walking): hypertension, Pt. on medications Normal cardiovascular exam Rhythm:Regular Rate:Normal     Neuro/Psych  Headaches PSYCHIATRIC DISORDERS Anxiety Depression     Neuromuscular disease    GI/Hepatic Neg liver ROS,GERD  Medicated and Controlled,,  Endo/Other  Hypothyroidism    Renal/GU negative Renal ROS  negative genitourinary   Musculoskeletal  (+) Arthritis , Osteoarthritis,    Abdominal   Peds negative pediatric ROS (+)  Hematology  (+) Blood dyscrasia, anemia   Anesthesia Other Findings   Reproductive/Obstetrics negative OB ROS                             Anesthesia Physical Anesthesia Plan  ASA: 2  Anesthesia Plan: General   Post-op Pain Management: Minimal or no pain anticipated   Induction: Intravenous  PONV Risk Score and Plan:   Airway Management Planned: Nasal Cannula and Natural Airway  Additional Equipment:   Intra-op Plan:   Post-operative Plan:   Informed Consent: I have reviewed the patients History and Physical, chart, labs and discussed the procedure including the risks, benefits and alternatives for the proposed anesthesia with the patient or authorized representative who has indicated his/her understanding and acceptance.     Dental advisory given  Plan Discussed with: CRNA and Surgeon  Anesthesia Plan Comments:        Anesthesia Quick Evaluation

## 2023-04-20 NOTE — Transfer of Care (Signed)
Immediate Anesthesia Transfer of Care Note  Patient: Laura [name]  Procedure(s) Performed: ESOPHAGOGASTRODUODENOSCOPY (EGD) WITH PROPOFOL BIOPSY  Patient Location: Short Stay  Anesthesia Type:General  Level of Consciousness: drowsy  Airway & Oxygen Therapy: Patient Spontanous Breathing  Post-op Assessment: Report given to RN and Post -op Vital signs reviewed and stable  Post vital signs: Reviewed and stable  Last Vitals:  Vitals Value Taken Time  BP 94/57 04/20/23 0839  Temp 36.4 C 04/20/23 0839  Pulse 57 04/20/23 0839  Resp 17 04/20/23 0839  SpO2 96 % 04/20/23 0839    Last Pain:  Vitals:   04/20/23 0839  TempSrc: Oral  PainSc:       Patients Stated Pain Goal: 7 (04/20/23 0647)  Complications: No notable events documented.

## 2023-04-20 NOTE — Discharge Instructions (Signed)
EGD Discharge instructions Please read the instructions outlined below and refer to this sheet in the next few weeks. These discharge instructions provide you with general information on caring for yourself after you leave the hospital. Your doctor may also give you specific instructions. While your treatment has been planned according to the most current medical practices available, unavoidable complications occasionally occur. If you have any problems or questions after discharge, please call your doctor. ACTIVITY You may resume your regular activity but move at a slower pace for the next 24 hours.  Take frequent rest periods for the next 24 hours.  Walking will help expel (get rid of) the air and reduce the bloated feeling in your abdomen.  No driving for 24 hours (because of the anesthesia (medicine) used during the test).  You may shower.  Do not sign any important legal documents or operate any machinery for 24 hours (because of the anesthesia used during the test).  NUTRITION Drink plenty of fluids.  You may resume your normal diet.  Begin with a light meal and progress to your normal diet.  Avoid alcoholic beverages for 24 hours or as instructed by your caregiver.  MEDICATIONS You may resume your normal medications unless your caregiver tells you otherwise.  WHAT YOU CAN EXPECT TODAY You may experience abdominal discomfort such as a feeling of fullness or "gas" pains.  FOLLOW-UP Your doctor will discuss the results of your test with you.  SEEK IMMEDIATE MEDICAL ATTENTION IF ANY OF THE FOLLOWING OCCUR: Excessive nausea (feeling sick to your stomach) and/or vomiting.  Severe abdominal pain and distention (swelling).  Trouble swallowing.  Temperature over 101 F (37.8 C).  Rectal bleeding or vomiting of blood.    Your EGD revealed mild amount inflammation in your stomach.  I took biopsies of this to rule out infection with a bacteria called H. pylori.  Await pathology results, my  office will contact you.  A polyp was found in the third portion of your small bowel.  I took samples of this today.  Esophagus appeared normal.  Continue current medications.  Follow-up in GI office in 2 to 3 months.  I hope you have a great rest of your week!  Laura [name]. Marletta Lor, D.O. Gastroenterology and Hepatology [facility]

## 2023-04-20 NOTE — Anesthesia Postprocedure Evaluation (Signed)
Anesthesia Post Note  Patient: Laura [name]  Procedure(s) Performed: ESOPHAGOGASTRODUODENOSCOPY (EGD) WITH PROPOFOL BIOPSY  Patient location during evaluation: Phase II Anesthesia Type: General Level of consciousness: awake and alert and oriented Pain management: pain level controlled Vital Signs Assessment: post-procedure vital signs reviewed and stable Respiratory status: spontaneous breathing, nonlabored ventilation and respiratory function stable Cardiovascular status: blood pressure returned to baseline and stable Postop Assessment: no apparent nausea or vomiting Anesthetic complications: no  No notable events documented.   Last Vitals:  Vitals:   04/20/23 0839 04/20/23 0842  BP: (!) 94/57 107/62  Pulse: (!) 57   Resp: 17 (!) 59  Temp: 36.4 C   SpO2: 96% 98%    Last Pain:  Vitals:   04/20/23 0842  TempSrc:   PainSc: 0-No pain                 Levester Waldridge C Elvin Mccartin

## 2023-04-21 LAB — SURGICAL PATHOLOGY

## 2023-04-24 ENCOUNTER — Encounter: Payer: Self-pay | Admitting: Internal Medicine

## 2023-04-24 ENCOUNTER — Ambulatory Visit (INDEPENDENT_AMBULATORY_CARE_PROVIDER_SITE_OTHER): Payer: Medicare Other | Admitting: Internal Medicine

## 2023-04-24 VITALS — BP 110/68 | HR 66 | Resp 16 | Wt 166.0 lb

## 2023-04-24 DIAGNOSIS — M1712 Unilateral primary osteoarthritis, left knee: Secondary | ICD-10-CM | POA: Diagnosis not present

## 2023-04-24 DIAGNOSIS — M25532 Pain in left wrist: Secondary | ICD-10-CM

## 2023-04-24 DIAGNOSIS — M170 Bilateral primary osteoarthritis of knee: Secondary | ICD-10-CM

## 2023-04-24 MED ORDER — LIDOCAINE HCL 1 % IJ SOLN
2.0000 mL | Freq: Once | INTRAMUSCULAR | Status: AC
Start: 2023-04-24 — End: 2023-04-24
  Administered 2023-04-24: 2 mL

## 2023-04-24 MED ORDER — TRIAMCINOLONE ACETONIDE 40 MG/ML IJ SUSP
40.0000 mg | Freq: Once | INTRAMUSCULAR | Status: AC
Start: 2023-04-24 — End: 2023-04-24
  Administered 2023-04-24: 40 mg via INTRA_ARTICULAR

## 2023-04-24 MED ORDER — BUPIVACAINE HCL (PF) 0.25 % IJ SOLN
4.0000 mL | Freq: Once | INTRAMUSCULAR | Status: AC
Start: 2023-04-24 — End: 2023-04-24
  Administered 2023-04-24: 4 mL

## 2023-04-24 NOTE — Assessment & Plan Note (Addendum)
Patient has osteoarthritis in both knees. Presents for steroid injection for osteoarthritis of left knee today. She has found almost 3 months relief of pain since last injection. No recent falls or trauma to the knee.  Assessment/Plan: Chronic illness with exacerbation  - Steroid injection today   Aspiration/Injection Procedure Note Laura [name] 10/09/1951  Procedure: Injection Indications: Left knee osteoarthitis Risks of procedure as well as the alternatives and risks of each were explained to the patient. Consent for procedure obtained.    After a time out was preformed, the knee was prepped in a sterile fashion. Cold spray was applied to the skin over the insertion site. The left knee superior lateral suprapatellar pouch was injected using 2 cc of 1% lidocaine on a 23-gauge 1-1/2 inch needle.  The syringe was switched to mixture containing 1 cc's of 40 mg Kenalog and 4 cc's of .25% Bupivacaine was injected.  Ultrasound was used. A sterile dressing was applied. The patient tolerated the procedures well without complication.

## 2023-04-24 NOTE — Progress Notes (Signed)
   HPI:[name] is a 72 y.o. female who presents for evaluation of left knee pain and left wrist pain. For the details of today's visit, please refer to the assessment and plan.  Physical Exam: Vitals:   04/24/23 0916  BP: 110/68  Pulse: 66  Resp: 16  SpO2: 96%  Weight: 166 lb (75.3 kg)     Physical Exam Constitutional:      General: She is not in acute distress.    Appearance: She is not ill-appearing.  Musculoskeletal:     Left knee: No swelling, deformity, effusion or erythema.     Wrist, left: TTP noted at the palmer side of wrist over previous surgical scar. Inspection yielded no erythema, ecchymosis, bony deformity, or swelling. ROM full with good flexion and extension and ulnar/radial deviation that is symmetrical with opposite wrist. Palpation is normal over metacarpals, scaphoid, lunate, and TFCC; tendons without tenderness/swelling. Strength 5/5 in all directions without pain. Provocative testing demonstrates pain over wrist with radiation into pinky with tinels. Negative Finkelstein's and Phalen's test  Assessment & Plan:   Laura [name] was seen today for knee pain.  Primary osteoarthritis of both knees Assessment & Plan: Patient has osteoarthritis in both knees. Presents for steroid injection for osteoarthritis of left knee today. She has found almost 3 months relief of pain since last injection. No recent falls or trauma to the knee.  Assessment/Plan: Chronic illness with exacerbation  - Steroid injection today   Aspiration/Injection Procedure Note Laura [name]  Procedure: Injection Indications: Left knee osteoarthitis Risks of procedure as well as the alternatives and risks of each were explained to the patient. Consent for procedure obtained.    After a time out was preformed, the knee was prepped in a sterile fashion. Cold spray was applied to the skin over the insertion site. The left knee superior lateral suprapatellar pouch was injected using  2 cc of 1% lidocaine on a 23-gauge 1-1/2 inch needle.  The syringe was switched to mixture containing 1 cc's of 40 mg Kenalog and 4 cc's of .25% Bupivacaine was injected.  Ultrasound was used. A sterile dressing was applied. The patient tolerated the procedures well without complication.     Orders: -     Lidocaine HCl -     Triamcinolone Acetonide -     BUPivacaine HCl (PF)  Left wrist pain Assessment & Plan: Patient is waking up at night with left arm burning. Feels like arm is hot/burning pain. Has numbness down into her fourth and fifth left finger. Old surgical scare over wrist. Patient has history of recent release surgery.  I review recent records from [facility] and patient had repeat release surgery earlier this year. She has had close follow up and improving at their last visit. My plan was to sen patient to [facility], but I have cancelled referral after seeing these records. I will discuss recommendation to follow up with [facility] to discuss on going symptoms since she has been under their care. Likely ulnar nerve entrapment.  She may be having some entrapment at elbow at night given burning pain is from elbow to wrist.          Milus Banister, MD

## 2023-04-24 NOTE — Assessment & Plan Note (Addendum)
Patient is waking up at night with left arm burning. Feels like arm is hot/burning pain. Has numbness down into her fourth and fifth left finger. Old surgical scare over wrist. Patient has history of recent release surgery.  I review recent records from [facility] and patient had repeat release surgery earlier this year. She has had close follow up and improving at their last visit. My plan was to sen patient to [facility], but I have cancelled referral after seeing these records. I will discuss recommendation to follow up with [facility] to discuss on going symptoms since she has been under their care. Likely ulnar nerve entrapment.  She may be having some entrapment at elbow at night given burning pain is from elbow to wrist.

## 2023-04-24 NOTE — Patient Instructions (Signed)
Thank you, [name] for allowing [name] to provide your care today.   Left knee injection was completed today for osteoarthritis. Go to the emergency room with any usual pain, swelling, or redness occurred in the injected area.    Referral placed for sports medicine for further evaluation of left wrist   Thurmon Fair, M.D.

## 2023-04-25 ENCOUNTER — Other Ambulatory Visit: Payer: Self-pay | Admitting: "Endocrinology

## 2023-04-25 DIAGNOSIS — E890 Postprocedural hypothyroidism: Secondary | ICD-10-CM

## 2023-04-26 ENCOUNTER — Encounter (HOSPITAL_COMMUNITY): Payer: Self-pay | Admitting: Internal Medicine

## 2023-04-28 ENCOUNTER — Telehealth: Payer: Self-pay | Admitting: Family Medicine

## 2023-04-28 NOTE — Telephone Encounter (Signed)
Patient returned missed call; unsure of who called her.  ? ?

## 2023-05-01 ENCOUNTER — Other Ambulatory Visit: Payer: Self-pay

## 2023-05-01 MED ORDER — AMLODIPINE BESYLATE 5 MG PO TABS: 5.0000 mg | ORAL_TABLET | Freq: Every day | ORAL | 3 refills | Status: DC

## 2023-05-03 ENCOUNTER — Other Ambulatory Visit: Payer: Self-pay | Admitting: "Endocrinology

## 2023-05-08 ENCOUNTER — Encounter: Payer: Self-pay | Admitting: Internal Medicine

## 2023-05-08 ENCOUNTER — Ambulatory Visit (INDEPENDENT_AMBULATORY_CARE_PROVIDER_SITE_OTHER): Payer: Medicare Other | Admitting: Internal Medicine

## 2023-05-08 VITALS — BP 127/69 | HR 71 | Ht 63.0 in | Wt 167.0 lb

## 2023-05-08 DIAGNOSIS — M1711 Unilateral primary osteoarthritis, right knee: Secondary | ICD-10-CM | POA: Diagnosis not present

## 2023-05-08 MED ORDER — TRIAMCINOLONE ACETONIDE 40 MG/ML IJ SUSP
40.00 mg | Freq: Once | INTRAMUSCULAR | Status: AC
Start: 2023-05-08 — End: 2023-05-08
  Administered 2023-05-08: 40 mg via INTRAMUSCULAR

## 2023-05-08 MED ORDER — LIDOCAINE HCL (PF) 1 % IJ SOLN
4.00 mL | Freq: Once | INTRAMUSCULAR | Status: AC
Start: 2023-05-08 — End: ?

## 2023-05-08 NOTE — Patient Instructions (Signed)
Received steroid injection in your right knee today for chronic osteoarthritis.  If your knee becomes red hot or swollen seek emergent attention.  Below you will find advice on managing your chronic pain. It has been a pleasure providing care for you.    Tylenol 500mg  1-2 tabs three times a day for pain. Capsaicin, aspercreme, or biofreeze topically up to four times a day may also help with pain. Some supplements that may help for arthritis: Boswellia extract, curcumin, pycnogenol Cortisone injections are an option. If cortisone injections do not help, there are different types of shots that may help but they take longer to take effect. It's important that you continue to stay active.  Straight leg raises, knee extensions 3 sets of 10 once a day (add ankle weight if these become too easy). Consider physical therapy to strengthen muscles around the joint that hurts to take pressure off of the joint itself. Shoe inserts with good arch support may be helpful. Heat or ice 15 minutes at a time 3-4 times a day as needed to help with pain. Water aerobics and cycling with low resistance are the best two types of exercise for arthritis though any exercise is ok as long as it doesn't worsen the pain.  I recommend Dr. Pearletha Forge for Dr[facility] for future injections and care of knee osteoarthritis.

## 2023-05-08 NOTE — Addendum Note (Signed)
Addended by: Telford Nab on: 05/08/2023 09:47 AM   Modules accepted: Orders

## 2023-05-08 NOTE — Assessment & Plan Note (Signed)
Patient last seen 03/13/2023 for right knee pain. Her last corticosteroid injection for her right knee was 02/06/2023. She uses tylenol for pain. She has tramadol for severe pain.  Reviewed recent endoscopy showing inflammation in gastric body.  She needs to avoid Nsaids. She would like to proceed with corticosteroid injection today. This is my last month in [facility]. I have suggested patient discuss being seen [facility] by Dr.Draper or Dr.Hudnall for future injections. They can also offer gel injections. She is aware surgery is also an option but would like to avoid. She will discuss with Dr.Simpson at her check up.   No significant effusion today. She has some medial joint line tenderness and crepitus.   Aspiration/Injection Procedure Note Laura [name] 26-Oct-1951  Procedure: Injection Indications: right knee osteoarthitis Risks of procedure as well as the alternatives and risks of each were explained to the patient. Consent for procedure obtained.    After a time out was preformed, the knee was prepped in a sterile fashion. Cold spray was applied to the skin over the insertion site. The right knee superior lateral suprapatellar pouch was injected using 2 cc of 1% lidocaine on a 23-gauge 1-1/2 inch needle.  The syringe was switched to mixture containing 1 cc's of 40 mg Kenalog and 4 cc's of lidocaine was injected.  Ultrasound was used. A sterile dressing was applied. The patient tolerated the procedures well without complication.

## 2023-05-08 NOTE — Progress Notes (Signed)
   HPI:Ms.ALESYA CASTELLANI is a 72 y.o. female who presents with right knee pain. For the details of today's visit, please refer to the assessment and plan.  Physical Exam: Vitals:   05/08/23 0814  BP: 127/69  Pulse: 71  SpO2: 96%  Weight: 167 lb (75.8 kg)  Height: 5\' 3"  (1.6 m)     Physical Exam Vitals reviewed.  Constitutional:      General: She is not in acute distress.    Appearance: She is not ill-appearing.  Musculoskeletal:     Right knee: Crepitus present. No swelling, deformity, effusion or erythema. Normal range of motion.  Neurological:     Gait: Gait normal.      Assessment & Plan:   Kielyn was seen today for injections.  Primary osteoarthritis of right knee Assessment & Plan: Patient last seen 03/13/2023 for right knee pain. Her last corticosteroid injection for her right knee was 02/06/2023. She uses tylenol for pain. She has tramadol for severe pain.  Reviewed recent endoscopy showing inflammation in gastric body.  She needs to avoid Nsaids. She would like to proceed with corticosteroid injection today. This is my last month in [facility]. I have suggested patient discuss being seen at [facility] by Dr.Draper or Dr.Hudnall for future injections. They can also offer gel injections. She is aware surgery is also an option but would like to avoid. She will discuss with Dr.Simpson at her check up.   No significant effusion today. She has some medial joint line tenderness and crepitus.   Aspiration/Injection Procedure Note ONNALEE BERRO 20-Oct-1951  Procedure: Injection Indications: right knee osteoarthitis Risks of procedure as well as the alternatives and risks of each were explained to the patient. Consent for procedure obtained.    After a time out was preformed, the knee was prepped in a sterile fashion. Cold spray was applied to the skin over the insertion site. The right knee superior lateral suprapatellar pouch was injected using 2 cc of 1% lidocaine on a  23-gauge 1-1/2 inch needle.  The syringe was switched to mixture containing 1 cc's of 40 mg Kenalog and 4 cc's of lidocaine was injected.  Ultrasound was used. A sterile dressing was applied. The patient tolerated the procedures well without complication.         Milus Banister, MD

## 2023-05-09 ENCOUNTER — Ambulatory Visit (INDEPENDENT_AMBULATORY_CARE_PROVIDER_SITE_OTHER): Payer: Medicare Other | Admitting: Family Medicine

## 2023-05-09 ENCOUNTER — Encounter: Payer: Self-pay | Admitting: Family Medicine

## 2023-05-09 VITALS — BP 127/72 | HR 75 | Ht 63.0 in | Wt 168.0 lb

## 2023-05-09 DIAGNOSIS — E782 Mixed hyperlipidemia: Secondary | ICD-10-CM | POA: Diagnosis not present

## 2023-05-09 DIAGNOSIS — J3089 Other allergic rhinitis: Secondary | ICD-10-CM

## 2023-05-09 DIAGNOSIS — E559 Vitamin D deficiency, unspecified: Secondary | ICD-10-CM

## 2023-05-09 DIAGNOSIS — F324 Major depressive disorder, single episode, in partial remission: Secondary | ICD-10-CM

## 2023-05-09 DIAGNOSIS — E663 Overweight: Secondary | ICD-10-CM

## 2023-05-09 DIAGNOSIS — R7303 Prediabetes: Secondary | ICD-10-CM | POA: Diagnosis not present

## 2023-05-09 DIAGNOSIS — M1711 Unilateral primary osteoarthritis, right knee: Secondary | ICD-10-CM

## 2023-05-09 DIAGNOSIS — R6889 Other general symptoms and signs: Secondary | ICD-10-CM

## 2023-05-09 DIAGNOSIS — Z1231 Encounter for screening mammogram for malignant neoplasm of breast: Secondary | ICD-10-CM | POA: Diagnosis not present

## 2023-05-09 DIAGNOSIS — I10 Essential (primary) hypertension: Secondary | ICD-10-CM | POA: Diagnosis not present

## 2023-05-09 DIAGNOSIS — E890 Postprocedural hypothyroidism: Secondary | ICD-10-CM

## 2023-05-09 DIAGNOSIS — F411 Generalized anxiety disorder: Secondary | ICD-10-CM

## 2023-05-09 NOTE — Patient Instructions (Addendum)
Annual exam mid January  with MD , call if you need me sooner  No med changes  Please schedule mammogram due In December at checkout  Please get Covid vaccine and flu vaccine in the Fall  Fasting CBC, lipid, cmp and eGFR and Vit D 3 to 5 days before January visit  Keep dancing!  Keep caring about people  Thanks for choosing [facility], we consider it a privelige to serve you.

## 2023-05-10 ENCOUNTER — Encounter: Payer: Self-pay | Admitting: Family Medicine

## 2023-05-10 MED ORDER — BUPROPION HCL ER (XL) 150 MG PO TB24: ORAL_TABLET | ORAL | 5 refills | Status: DC

## 2023-05-10 NOTE — Assessment & Plan Note (Signed)
Controlled, no change in medication  

## 2023-05-10 NOTE — Assessment & Plan Note (Signed)
Updated lab needed at/ before next visit.   

## 2023-05-10 NOTE — Progress Notes (Signed)
Laura [name]     MRN: [id number]      DOB: 10-Dec-1950  Chief Complaint  Patient presents with   Gastroesophageal Reflux    Follow up    HPI Ms. Marszalek is here for follow up and re-evaluation of chronic medical conditions, medication management and review of any available recent lab and radiology data.  Preventive health is updated, specifically  Cancer screening and Immunization.   Questions or concerns regarding consultations or procedures which the PT has had in the interim are  addressed. The PT denies any adverse reactions to current medications since the last visit.  There are no new concerns.  States she is dpoing well just wants more energy, however on furhter questioning has no real energy shortage/ lack No longer having abnormalsensations through her body tha disturb her sleep ROS Denies recent fever or chills. Denies sinus pressure, nasal congestion, ear pain or sore throat. Denies chest congestion, productive cough or wheezing. Denies chest pains, palpitations and leg swelling Denies abdominal pain, nausea, vomiting,diarrhea or constipation.   Denies dysuria, frequency, hesitancy or incontinence. Denies joint pain, swelling and limitation in mobility. Denies headaches, seizures, numbness, or tingling. Denies depression, anxiety or insomnia. Denies skin break down or rash.   PE  BP 127/72   Pulse 75   Ht 5\' 3"  (1.6 m)   Wt 168 lb (76.2 kg)   SpO2 98%   BMI 29.76 kg/m   Patient alert and oriented and in no cardiopulmonary distress.  HEENT: No facial asymmetry, EOMI,     Neck supple .  Chest: Clear to auscultation bilaterally.  CVS: S1, S2 no murmurs, no S3.Regular rate.  ABD: Soft non tender.   Ext: No edema  MS: Adequate ROM spine, shoulders, hips and knees.  Skin: Intact, no ulcerations or rash noted.  Psych: Good eye contact, normal affect. Memory intact not anxious or depressed appearing.  CNS: CN 2-12 intact, power,  normal throughout.no focal  deficits noted.   Assessment & Plan  Essential hypertension DASH diet and commitment to daily physical activity for a minimum of 30 minutes discussed and encouraged, as a part of hypertension management. The importance of attaining a healthy weight is also discussed.     05/09/2023    9:29 AM 05/08/2023    8:14 AM 04/24/2023    9:16 AM 04/20/2023    8:42 AM 04/20/2023    8:39 AM 04/20/2023    6:47 AM 04/18/2023    2:04 PM  BP/Weight  Systolic BP 127 127 110 107 94 138 112  Diastolic BP 72 69 68 62 57 65 66  Wt. (Lbs) 168 167 166   166.89 166.6  BMI 29.76 kg/m2 29.58 kg/m2 29.41 kg/m2   29.56 kg/m2 29.51 kg/m2     Controlled, no change in medication   Allergic rhinitis Controlled, no change in medication   GAD (generalized anxiety disorder) Controlled, no change in medication   Depression, major, single episode, in partial remission (HCC) Controlled, no change in medication   Mixed hyperlipidemia Hyperlipidemia:Low fat diet discussed and encouraged.   Lipid Panel  Lab Results  Component Value Date   CHOL 204 (H) 04/03/2023   HDL 63 04/03/2023   LDLCALC 121 (H) 04/03/2023   TRIG 113 04/03/2023   CHOLHDL 3.2 04/03/2023     Needs to lower fat intake not at goal  Osteoarthritis of right knee Reports improved symptoms had injection 1 day ago  Sensation of feeling hot Resolved   Overweight (BMI 25.0-29.9)  Patient re-educated about  the importance of commitment to a  minimum of 150 minutes of exercise per week as able.  The importance of healthy food choices with portion control discussed, as well as eating regularly and within a 12 hour window most days. The need to choose "clean , green" food 50 to 75% of the time is discussed, as well as to make water the primary drink and set a goal of 64 ounces water daily.       05/09/2023    9:29 AM 05/08/2023    8:14 AM 04/24/2023    9:16 AM  Weight /BMI  Weight 168 lb 167 lb 166 lb  Height 5\' 3"  (1.6 m) 5\' 3"  (1.6 m)    BMI 29.76 kg/m2 29.58 kg/m2 29.41 kg/m2    Unchanged  Prediabetes Patient educated about the importance of limiting  Carbohydrate intake , the need to commit to daily physical activity for a minimum of 30 minutes , and to commit weight loss. The fact that changes in all these areas will reduce or eliminate all together the development of diabetes is stressed.      Latest Ref Rng & Units 04/03/2023   10:22 AM 04/03/2023   10:20 AM 11/08/2022    9:09 AM 10/04/2022   10:14 AM 06/21/2022    2:44 PM  Diabetic Labs  Chol 100 - 199 mg/dL  409  811  914  782   HDL >39 mg/dL  63  63  55  76   Calc LDL 0 - 99 mg/dL  956  213  86  086   Triglycerides 0 - 149 mg/dL  578  469  75  629   Creatinine 0.57 - 1.00 mg/dL 5.28   4.13         12/04/4008    9:29 AM 05/08/2023    8:14 AM 04/24/2023    9:16 AM 04/20/2023    8:42 AM 04/20/2023    8:39 AM 04/20/2023    6:47 AM 04/18/2023    2:04 PM  BP/Weight  Systolic BP 127 127 110 107 94 138 112  Diastolic BP 72 69 68 62 57 65 66  Wt. (Lbs) 168 167 166   166.89 166.6  BMI 29.76 kg/m2 29.58 kg/m2 29.41 kg/m2   29.56 kg/m2 29.51 kg/m2       No data to display            Postsurgical hypothyroidism Managed by Endo and controlled

## 2023-05-10 NOTE — Assessment & Plan Note (Signed)
Hyperlipidemia:Low fat diet discussed and encouraged.   Lipid Panel  Lab Results  Component Value Date   CHOL 204 (H) 04/03/2023   HDL 63 04/03/2023   LDLCALC 121 (H) 04/03/2023   TRIG 113 04/03/2023   CHOLHDL 3.2 04/03/2023     Needs to lower fat intake not at goal

## 2023-05-10 NOTE — Assessment & Plan Note (Signed)
Reports improved symptoms had injection 1 day ago

## 2023-05-10 NOTE — Assessment & Plan Note (Signed)
Patient educated about the importance of limiting  Carbohydrate intake , the need to commit to daily physical activity for a minimum of 30 minutes , and to commit weight loss. The fact that changes in all these areas will reduce or eliminate all together the development of diabetes is stressed.      Latest Ref Rng & Units 04/03/2023   10:22 AM 04/03/2023   10:20 AM 11/08/2022    9:09 AM 10/04/2022   10:14 AM 06/21/2022    2:44 PM  Diabetic Labs  Chol 100 - 199 mg/dL  191  478  295  621   HDL >39 mg/dL  63  63  55  76   Calc LDL 0 - 99 mg/dL  308  657  86  846   Triglycerides 0 - 149 mg/dL  962  952  75  841   Creatinine 0.57 - 1.00 mg/dL 3.24   4.01         0/12/7251    9:29 AM 05/08/2023    8:14 AM 04/24/2023    9:16 AM 04/20/2023    8:42 AM 04/20/2023    8:39 AM 04/20/2023    6:47 AM 04/18/2023    2:04 PM  BP/Weight  Systolic BP 127 127 110 107 94 138 112  Diastolic BP 72 69 68 62 57 65 66  Wt. (Lbs) 168 167 166   166.89 166.6  BMI 29.76 kg/m2 29.58 kg/m2 29.41 kg/m2   29.56 kg/m2 29.51 kg/m2       No data to display

## 2023-05-10 NOTE — Assessment & Plan Note (Signed)
Resolved

## 2023-05-10 NOTE — Assessment & Plan Note (Signed)
Managed by Endo and controlled 

## 2023-05-10 NOTE — Assessment & Plan Note (Signed)
  Patient re-educated about  the importance of commitment to a  minimum of 150 minutes of exercise per week as able.  The importance of healthy food choices with portion control discussed, as well as eating regularly and within a 12 hour window most days. The need to choose "clean , green" food 50 to 75% of the time is discussed, as well as to make water the primary drink and set a goal of 64 ounces water daily.       05/09/2023    9:29 AM 05/08/2023    8:14 AM 04/24/2023    9:16 AM  Weight /BMI  Weight 168 lb 167 lb 166 lb  Height 5\' 3"  (1.6 m) 5\' 3"  (1.6 m)   BMI 29.76 kg/m2 29.58 kg/m2 29.41 kg/m2    Unchanged

## 2023-05-10 NOTE — Assessment & Plan Note (Signed)
DASH diet and commitment to daily physical activity for a minimum of 30 minutes discussed and encouraged, as a part of hypertension management. The importance of attaining a healthy weight is also discussed.     05/09/2023    9:29 AM 05/08/2023    8:14 AM 04/24/2023    9:16 AM 04/20/2023    8:42 AM 04/20/2023    8:39 AM 04/20/2023    6:47 AM 04/18/2023    2:04 PM  BP/Weight  Systolic BP 127 127 110 107 94 138 112  Diastolic BP 72 69 68 62 57 65 66  Wt. (Lbs) 168 167 166   166.89 166.6  BMI 29.76 kg/m2 29.58 kg/m2 29.41 kg/m2   29.56 kg/m2 29.51 kg/m2     Controlled, no change in medication

## 2023-05-15 ENCOUNTER — Encounter: Payer: Self-pay | Admitting: Internal Medicine

## 2023-05-15 ENCOUNTER — Ambulatory Visit (INDEPENDENT_AMBULATORY_CARE_PROVIDER_SITE_OTHER): Payer: Medicare Other | Admitting: Internal Medicine

## 2023-05-15 ENCOUNTER — Telehealth: Payer: Self-pay | Admitting: Internal Medicine

## 2023-05-15 VITALS — BP 126/74 | HR 75 | Resp 16 | Ht 63.0 in | Wt 168.0 lb

## 2023-05-15 DIAGNOSIS — M25561 Pain in right knee: Secondary | ICD-10-CM | POA: Diagnosis not present

## 2023-05-15 DIAGNOSIS — G8929 Other chronic pain: Secondary | ICD-10-CM | POA: Diagnosis not present

## 2023-05-15 MED ORDER — TRAMADOL HCL 50 MG PO TABS
50.0000 mg | ORAL_TABLET | Freq: Three times a day (TID) | ORAL | 0 refills | Status: DC
Start: 2023-05-15 — End: 2023-06-22

## 2023-05-15 NOTE — Telephone Encounter (Signed)
We scheduled patient for acute visit today.

## 2023-05-15 NOTE — Progress Notes (Signed)
   HPI:[name] is a 72 y.o. female who presents for evaluation of Knee Pain (Started this am, swelling and aching like a toothache. Rates pain 8/10) . For the details of today's visit, please refer to the assessment and plan.  Physical Exam: Vitals:   05/15/23 1617  BP: 126/74  Pulse: 75  Resp: 16  SpO2: 98%  Weight: 168 lb (76.2 kg)  Height: 5\' 3"  (1.6 m)     Physical Exam Musculoskeletal:     Comments: Right Knee: - Inspection: No gross deformity. No effusion. No erythema or bruising. Skin intact - Palpation: TTP on medial joint line - ROM: full active ROM with flexion and extension in knee - Strength: 5/5 strength - Neuro/vasc: NV intact - Special Tests: - LIGAMENTS: negative anterior/posterior drawer, negative Lachman's, no MCL or LCL laxity  -- MENISCUS: pain with valgus stress and externally rotation of knee  Hips: normal, pain free passive ROM with IR/ER         Assessment & Plan:   Laura [name] was seen today for knee pain.  Chronic pain of right knee Assessment & Plan: Patient presented today with worsening of right knee pain which started this morning. Describes aching like a tooth ache and 8/10. Limited POCUS of right knee showed she has a minimal anechoic fluid in suprapatellar pouch with no septations. Normal lateral meniscus. Hypercholic irregularity at medical joint line suggesting osteophyte. Anechoic fluid surrounding medial mensicus suggesting possible meniscal tear.  Knee is not red or hot. Patient pain at palpation of medial joint line and pain with Mcmurray test support meniscal tear. Patient could not perform Thessaly's .  Chronic problem, with exacerbation I suspect patient has meniscal tear. Given right knee pain has been treated as osteoarthritis , but not improving I will send for MRI. I do not suspect infection at this time, but have given patient precautions. Refilled Tramadol for pain.  Recommend icing knee.  MRI ordered to better  evaluate meniscus.   Orders: -     traMADol HCl; Take 1 tablet (50 mg total) by mouth every 8 (eight) hours.  Dispense: 20 tablet; Refill: 0      Milus Banister, MD

## 2023-05-15 NOTE — Patient Instructions (Signed)
Thank you, [name] for allowing [name] to provide your care today.   You do not have signs of infection today. If your knee becomes red, hot, swollen then go to Emergency department. Your medial meniscus has some fluid around it. Continue to ice your knee as needed. I have prescribed tramadol for pain.  Follow up if symptoms worsen or fail to improve      Thurmon Fair, M.D.

## 2023-05-15 NOTE — Telephone Encounter (Signed)
Patient came by the office patient right knee is swollen and stiff. What does patient need to do.

## 2023-05-15 NOTE — Assessment & Plan Note (Addendum)
Patient presented today with worsening of right knee pain which started this morning. Describes aching like a tooth ache and 8/10. Limited POCUS of right knee showed she has a minimal anechoic fluid in suprapatellar pouch with no septations. Normal lateral meniscus. Hypercholic irregularity at medical joint line suggesting osteophyte. Anechoic fluid surrounding medial mensicus suggesting possible meniscal tear.  Knee is not red or hot. Patient pain at palpation of medial joint line and pain with Mcmurray test support meniscal tear. Patient could not perform Thessaly's .  Chronic problem, with exacerbation I suspect patient has meniscal tear. Given right knee pain has been treated as osteoarthritis , but not improving I will send for MRI. I do not suspect infection at this time, but have given patient precautions. Refilled Tramadol for pain.  Recommend icing knee.  MRI ordered to better evaluate meniscus.

## 2023-05-15 NOTE — Assessment & Plan Note (Deleted)
Patient presented today with worsening of right knee pain which started this morning. Describes aching like a tooth ache and 8/10. Limited POCUS of right knee showed she has a minimal anechoic fluid in suprapatellar pouch with no septations. Normal lateral meniscus. Hypercholic irregularity at medical joint line suggesting osteophyte. Anechoic fluid surrounding medial mensicus suggesting possible meniscal tear.  Knee is not red or hot. Patient pain at palpation of medial joint line and pain with Mcmurray test. Patient could not perform Thessaly's .  Chronic problem, with exacerbation I suspect patient has meniscal tear. I do not suspect infection at this time, but have given patient precautions. Refilled Tramadol for pain.  Recommend icing knee.  MRI ordered to better evaluate meniscus.

## 2023-06-05 ENCOUNTER — Telehealth: Payer: Self-pay | Admitting: Gastroenterology

## 2023-06-05 NOTE — Telephone Encounter (Signed)
Patient stopped by the office and asked for you to "look at" Omeprazone cap 20 mg.  She says it doesn't seem to be working.  She has to take gingerale to make her burp.    Patient left a bottle of the medication with a note in it for you .Marland KitchenMarland KitchenMarland Kitchen

## 2023-06-05 NOTE — Telephone Encounter (Signed)
Returned the pt's call no ans/vm to LM for the pt.

## 2023-06-07 ENCOUNTER — Ambulatory Visit (HOSPITAL_COMMUNITY)
Admission: RE | Admit: 2023-06-07 | Discharge: 2023-06-07 | Disposition: A | Discharge status: Home / Self Care | Payer: Medicare Other | Source: Ambulatory Visit | Attending: Internal Medicine | Admitting: Internal Medicine

## 2023-06-07 DIAGNOSIS — G8929 Other chronic pain: Secondary | ICD-10-CM | POA: Diagnosis not present

## 2023-06-07 DIAGNOSIS — M25561 Pain in right knee: Secondary | ICD-10-CM | POA: Diagnosis not present

## 2023-06-07 DIAGNOSIS — S83231A Complex tear of medial meniscus, current injury, right knee, initial encounter: Secondary | ICD-10-CM | POA: Diagnosis not present

## 2023-06-07 DIAGNOSIS — M7121 Synovial cyst of popliteal space [Baker], right knee: Secondary | ICD-10-CM | POA: Diagnosis not present

## 2023-06-07 DIAGNOSIS — M1711 Unilateral primary osteoarthritis, right knee: Secondary | ICD-10-CM | POA: Diagnosis not present

## 2023-06-07 DIAGNOSIS — M25461 Effusion, right knee: Secondary | ICD-10-CM | POA: Diagnosis not present

## 2023-06-12 NOTE — Telephone Encounter (Signed)
Phoned and LMOVM for the pt to return call 

## 2023-06-15 ENCOUNTER — Ambulatory Visit (INDEPENDENT_AMBULATORY_CARE_PROVIDER_SITE_OTHER): Payer: Medicare Other | Admitting: Family Medicine

## 2023-06-15 ENCOUNTER — Encounter: Payer: Self-pay | Admitting: Family Medicine

## 2023-06-15 VITALS — BP 113/69 | HR 67 | Ht 63.0 in | Wt 168.1 lb

## 2023-06-15 DIAGNOSIS — I10 Essential (primary) hypertension: Secondary | ICD-10-CM | POA: Diagnosis not present

## 2023-06-15 DIAGNOSIS — M25561 Pain in right knee: Secondary | ICD-10-CM | POA: Diagnosis not present

## 2023-06-15 DIAGNOSIS — R2681 Unsteadiness on feet: Secondary | ICD-10-CM | POA: Diagnosis not present

## 2023-06-15 DIAGNOSIS — D649 Anemia, unspecified: Secondary | ICD-10-CM | POA: Diagnosis not present

## 2023-06-15 DIAGNOSIS — R5382 Chronic fatigue, unspecified: Secondary | ICD-10-CM

## 2023-06-15 DIAGNOSIS — E559 Vitamin D deficiency, unspecified: Secondary | ICD-10-CM | POA: Diagnosis not present

## 2023-06-15 DIAGNOSIS — G8929 Other chronic pain: Secondary | ICD-10-CM

## 2023-06-15 DIAGNOSIS — Z1231 Encounter for screening mammogram for malignant neoplasm of breast: Secondary | ICD-10-CM | POA: Diagnosis not present

## 2023-06-15 DIAGNOSIS — E782 Mixed hyperlipidemia: Secondary | ICD-10-CM | POA: Diagnosis not present

## 2023-06-15 NOTE — Patient Instructions (Addendum)
F/U in  8 to 10 weeks, call if you need me sooner  Labs CBC, B12,  iron, ferritin, chem 7 and magnesium  today  Please schedule mammogram at checkoout  You are referred urgently to Orthopedics , you will becontacte with appt info  Be careful not to fall  Keep spreading ajoy and love as able  Thanks for choosing [facility], we consider it a privelige to serve you.

## 2023-06-15 NOTE — Progress Notes (Signed)
   Laura [name]     MRN: [id number]      DOB: 1951/04/13  Chief Complaint  Patient presents with   Follow-up    Follow up knee pain requesting referral and lab work   Swelling and redness and deformity of right knee started on 05/15/2023 acutely, has had mRI of knee and needs follow up,, pain and mobility is worsening, no falls no near falls, unable to do steps and get up from the floor without help  HPI Laura [name] is here with above complaint Denies recent fever or chills. Denies sinus pressure, nasal congestion, ear pain or sore throat. Denies chest congestion, productive cough or wheezing. Denies chest pains, palpitations and leg swelling Denies abdominal pain, nausea, vomiting,diarrhea or constipation.   Denies dysuria, frequency, hesitancy or incontinence. Denies headaches, seizures, numbness, or tingling. Denies uncontrolled  depression, anxiety or insomnia. Denies skin break down or rash.   PE  BP 113/69 (BP Location: Right Arm, Patient Position: Sitting, Cuff Size: Large)   Pulse 67   Ht 5\' 3"  (1.6 m)   Wt 168 lb 1.3 oz (76.2 kg)   SpO2 96%   BMI 29.77 kg/m   Patient alert and oriented and in no cardiopulmonary distress.  HEENT: No facial asymmetry, EOMI,     Neck supple .  Chest: Clear to auscultation bilaterally.  CVS: S1, S2 no murmurs, no S3.Regular rate.    Ext: No edema  MS: Adequate ROM spine, shoulders, hips and left knee Markedly reduced ROM right knee with swelling and tenderness anteriorly  Skin: Intact, no ulcerations or rash noted.  Psych: Good eye contact, normal affect. Memory intact not anxious or depressed appearing.  CNS: CN 2-12 intact, power,  normal throughout.no focal deficits noted.   Assessment & Plan  Right knee pain 1 month h/o right knee pain and swelling acutely, needs Ortho eval asap, MRI done 07/15,needs interpretation  Vitamin D deficiency Updated lab needed at/ before next visit.   Unsteady gait when  walking Currently at increased risk of fall, safety discussed  Essential hypertension Controlled, no change in medication   Fatigue Check B12 level  Mixed hyperlipidemia Hyperlipidemia:Low fat diet discussed and encouraged.   Lipid Panel  Lab Results  Component Value Date   CHOL 204 (H) 04/03/2023   HDL 63 04/03/2023   LDLCALC 121 (H) 04/03/2023   TRIG 113 04/03/2023   CHOLHDL 3.2 04/03/2023     Not at goal, needs to reduce fried and ftty foods

## 2023-06-15 NOTE — Assessment & Plan Note (Signed)
Updated lab needed at/ before next visit.   

## 2023-06-15 NOTE — Assessment & Plan Note (Signed)
Hyperlipidemia:Low fat diet discussed and encouraged.   Lipid Panel  Lab Results  Component Value Date   CHOL 204 (H) 04/03/2023   HDL 63 04/03/2023   LDLCALC 121 (H) 04/03/2023   TRIG 113 04/03/2023   CHOLHDL 3.2 04/03/2023     Not at goal, needs to reduce fried and ftty foods

## 2023-06-15 NOTE — Assessment & Plan Note (Signed)
Check B12 level. 

## 2023-06-15 NOTE — Assessment & Plan Note (Signed)
1 month h/o right knee pain and swelling acutely, needs Ortho eval asap, MRI done 07/15,needs interpretation

## 2023-06-15 NOTE — Assessment & Plan Note (Signed)
Currently at increased risk of fall, safety discussed

## 2023-06-15 NOTE — Assessment & Plan Note (Signed)
Controlled, no change in medication  

## 2023-06-16 LAB — CBC
Hematocrit: 37.4 % (ref 34.0–46.6)
Hemoglobin: 11.7 g/dL (ref 11.1–15.9)
MCH: 27.7 pg (ref 26.6–33.0)
MCHC: 31.3 g/dL — ABNORMAL LOW (ref 31.5–35.7)
MCV: 88 fL (ref 79–97)
Platelets: 310 10*3/uL (ref 150–450)
RBC: 4.23 x10E6/uL (ref 3.77–5.28)
RDW: 11.8 % (ref 11.7–15.4)
WBC: 5.3 10*3/uL (ref 3.4–10.8)

## 2023-06-16 LAB — BMP8+EGFR
BUN/Creatinine Ratio: 12 (ref 12–28)
BUN: 12 mg/dL (ref 8–27)
CO2: 26 mmol/L (ref 20–29)
Calcium: 10.5 mg/dL — ABNORMAL HIGH (ref 8.7–10.3)
Chloride: 99 mmol/L (ref 96–106)
Creatinine, Ser: 1.03 mg/dL — ABNORMAL HIGH (ref 0.57–1.00)
Glucose: 97 mg/dL (ref 70–99)
Potassium: 3.7 mmol/L (ref 3.5–5.2)
Sodium: 140 mmol/L (ref 134–144)
eGFR: 58 mL/min/{1.73_m2} — ABNORMAL LOW (ref >59)

## 2023-06-16 LAB — IRON: Iron: 84 ug/dL (ref 27–139)

## 2023-06-16 LAB — FERRITIN: Ferritin: 112 ng/mL (ref 15–150)

## 2023-06-16 LAB — VITAMIN B12: Vitamin B-12: 1633 pg/mL — ABNORMAL HIGH (ref 232–1245)

## 2023-06-19 NOTE — Progress Notes (Signed)
Called patient and discussed findings on MRI. She has tricompartmental arthritis and didn't respond as well a hoped for after last steroid injection. Her acute pain seems to be coming from her medial meniscus. She has been referred to ortho by Dr.Simpson. I will defer further treatment to them at this time.

## 2023-06-21 ENCOUNTER — Other Ambulatory Visit: Payer: Self-pay | Admitting: Family Medicine

## 2023-06-21 ENCOUNTER — Encounter: Payer: Self-pay | Admitting: Orthopedic Surgery

## 2023-06-21 ENCOUNTER — Other Ambulatory Visit: Payer: Self-pay

## 2023-06-21 ENCOUNTER — Ambulatory Visit: Payer: Medicare Other | Admitting: Orthopedic Surgery

## 2023-06-21 VITALS — BP 128/72 | HR 64 | Ht 63.0 in | Wt 168.0 lb

## 2023-06-21 DIAGNOSIS — M1711 Unilateral primary osteoarthritis, right knee: Secondary | ICD-10-CM | POA: Diagnosis not present

## 2023-06-21 DIAGNOSIS — M25561 Pain in right knee: Secondary | ICD-10-CM

## 2023-06-21 NOTE — Patient Instructions (Signed)

## 2023-06-21 NOTE — Progress Notes (Signed)
New Patient Visit  Assessment: Laura [name] is a 72 y.o. female with the following: 1. Arthritis of right knee  Plan: ODETTE WOLFARTH has advanced degenerative changes in the right knee.  She has bone-on-bone arthritis within the medial compartment.  MRI confirms advanced degenerative changes.  At this point, she has limited options.  Continue medications as needed.  I offered her injection, and she elected proceed.  If the injection is not effective, we will have to consider knee replacement.  She states her understanding.  She will follow-up as needed.  Procedure note injection Right knee joint   Verbal consent was obtained to inject the right knee joint  Timeout was completed to confirm the site of injection.  The skin was prepped with alcohol and ethyl chloride was sprayed at the injection site.  A 21-gauge needle was used to inject 40 mg of Depo-Medrol and 1% lidocaine (4 cc) into the right knee using an anterolateral approach.  There were no complications. A sterile bandage was applied.   Follow-up: Return if symptoms worsen or fail to improve.  Subjective:  Chief Complaint  Patient presents with   Knee Pain    R knee pain no injury that she can think of.     History of Present Illness: Laura [name] is a 72 y.o. female who has been referred by Syliva Overman, MD for evaluation of right knee pain.  She has had progressively worsening right knee pain over the past couple months.  No specific injury.  She does have a history of partial knee replacement on the left side.  She has had some swelling recently.  Her primary care doctor ordered an MRI.  No therapy.  No recent injections.   Review of Systems: No fevers or chills No numbness or tingling No chest pain No shortness of breath No bowel or bladder dysfunction No GI distress No headaches   Medical History:  Past Medical History:  Diagnosis Date   Anxiety    At low risk for fall 08/15/2018   Cystitis     Depression    Gastritis    GERD (gastroesophageal reflux disease)    Hashimoto's thyroiditis    Hx    Hyperlipidemia    Hypertension    Hypothyroidism    Normocytic anemia 2009 Hb 9.9-11.1   Osteopenia    Shoulder pain    Urinary incontinence     Past Surgical History:  Procedure Laterality Date   Bilateral tubal ligation     BIOPSY  04/21/2020   Procedure: BIOPSY;  Surgeon: Corbin Ade, MD;  Location: [facility];  Service: Endoscopy;;   BIOPSY  04/20/2023   Procedure: BIOPSY;  Surgeon: [name], DO;  Location: [facility];  Service: Endoscopy;;   BREAST EXCISIONAL BIOPSY  2010   Excisional biopsy of benign left breast mass -lopoma    BREAST SURGERY     left nreast biopsy   COLONOSCOPY  08/26/2011   SLF: 1. Internal hemorrhoids   COLONOSCOPY N/A 10/06/2015   ZOX:WRUE fissure or internal hemorrhoids/mild sigmoid colitis. benign colonic path    COLONOSCOPY WITH PROPOFOL N/A 03/15/2021   Procedure: COLONOSCOPY WITH PROPOFOL;  Surgeon: [name], DO;  Location: [facility];  Service: Endoscopy;  Laterality: N/A;  3:00pm   colonscopy  2005   Dr. Katrinka Blazing   ESOPHAGOGASTRODUODENOSCOPY  12/2009   chronic gastritis   ESOPHAGOGASTRODUODENOSCOPY N/A 05/17/2017   Dr. Darrick Penna; gastritis, normal small bowel biopsies, multiple gastric polyps.  fundic gland polyps   ESOPHAGOGASTRODUODENOSCOPY (EGD) WITH PROPOFOL N/A 04/21/2020   Rourk: multiple gastric polyps, biopsy of one c/w fundic gland. gastric biopsy showed mild chronic gastritis but no H.pylori   ESOPHAGOGASTRODUODENOSCOPY (EGD) WITH PROPOFOL N/A 04/20/2023   Procedure: ESOPHAGOGASTRODUODENOSCOPY (EGD) WITH PROPOFOL;  Surgeon: [name], DO;  Location: [facility];  Service: Endoscopy;  Laterality: N/A;  1:00 pm, asa 3, pt knows to arrive at 6:30   GIVENS CAPSULE STUDY N/A 05/30/2017   gastritis, no source for anemia identified   left knee surgery Left March 1,2017   PARTIAL KNEE ARTHROPLASTY Left 08/09/2017    Procedure: LEFT UNICOMPARTMENTAL KNEE;  Surgeon: Ollen Gross, MD;  Location: [facility];  Service: Orthopedics;  Laterality: Left;   POLYPECTOMY  04/21/2020   Procedure: POLYPECTOMY;  Surgeon: Corbin Ade, MD;  [facility]: [facility];  Service: Endoscopy;;  gastric   Resection of left lobe of thyroid     right carpal tunnel release     rt. knee athroscopy  2004   SAVORY DILATION N/A 05/17/2017   Procedure: SAVORY DILATION;  Surgeon: West Bali, MD;  Location: [facility];  Service: Endoscopy;  Laterality: N/A;   TOTAL ABDOMINAL HYSTERECTOMY  1994   UMBILICAL HERNIA REPAIR     Urethral dilation for stenosis  2009    Family History  Problem Relation Age of Onset   Pneumonia Mother    Healthy Father    Colon cancer Neg Hx    Anesthesia problems Neg Hx    Hypotension Neg Hx    Malignant hyperthermia Neg Hx    Pseudochol deficiency Neg Hx    Social History   Tobacco Use   Smoking status: Former    Current packs/day: 0.00    Average packs/day: 0.3 packs/day for 1 year (0.3 ttl pk-yrs)    Types: Cigarettes    Start date: 08/26/1971    Quit date: 08/25/1972    Years since quitting: 50.8    Passive exposure: Past   Smokeless tobacco: Never  Vaping Use   Vaping status: Never Used  Substance Use Topics   Alcohol use: No   Drug use: No    Allergies  Allergen Reactions   Cozaar [Losartan Potassium] Cough   Dilaudid [Hydromorphone Hcl] Other (See Comments)    Confusion;altered state of mind   Levaquin [Levofloxacin] Other (See Comments)    Dry throat   Nsaids Other (See Comments)    per GI pt no longer to use NSAIDS    Promethazine     Legs itching, RESTLESS LEGS    No outpatient medications have been marked as taking for the 06/21/23 encounter (Office Visit) with Oliver Barre, MD.   Current Facility-Administered Medications for the 06/21/23 encounter (Office Visit) with Oliver Barre, MD  Medication   lidocaine (PF) (XYLOCAINE) 1 % injection 4 mL     Objective: BP 128/72   Pulse 64   Ht 5\' 3"  (1.6 m)   Wt 168 lb (76.2 kg)   BMI 29.76 kg/m   Physical Exam:  General: Alert and oriented. and No acute distress. Gait: Right sided antalgic gait.  Evaluation of the right knee demonstrates a mild effusion.  No obvious malalignment.  She has tenderness palpation in the lateral joint line.  Exquisite tenderness palpation over the medial joint line.  She can get to full extension.  She tolerates flexion beyond 90 degrees.  IMAGING: I personally ordered and reviewed the following images  X-rays of the  right knee were obtained in clinic today.  No acute injuries noted.  Complete loss of joint space within the medial compartment.  There is some subchondral sclerosis.  Minimal osteophytes.  No bony lesions.  Impression: Severe right knee arthritis, primarily within the medial compartment   New Medications:  No orders of the defined types were placed in this encounter.     Oliver Barre, MD  06/21/2023 3:37 PM

## 2023-06-21 NOTE — Progress Notes (Deleted)
New Patient Visit  Assessment: Laura [name] is a 72 y.o. female with the following: 1. Acute pain of right knee ***   Plan: [name]   Follow-up: Return if symptoms worsen or fail to improve.  Subjective:  Chief Complaint  Patient presents with   Knee Pain    R knee pain no injury that she can think of.     History of Present Illness: Laura [name] is a 72 y.o. female who {Presentation:27320} for evaluation of    Review of Systems: No fevers or chills*** No numbness or tingling No chest pain No shortness of breath No bowel or bladder dysfunction No GI distress No headaches   Medical History:  Past Medical History:  Diagnosis Date   Anxiety    At low risk for fall 08/15/2018   Cystitis    Depression    Gastritis    GERD (gastroesophageal reflux disease)    Hashimoto's thyroiditis    Hx    Hyperlipidemia    Hypertension    Hypothyroidism    Normocytic anemia 2009 Hb 9.9-11.1   Osteopenia    Shoulder pain    Urinary incontinence     Past Surgical History:  Procedure Laterality Date   Bilateral tubal ligation     BIOPSY  04/21/2020   Procedure: BIOPSY;  Surgeon: Corbin Ade, MD;  [facility]: [facility];  Service: Endoscopy;;   BIOPSY  04/20/2023   Procedure: BIOPSY;  Surgeon: [name], DO;  [facility]: [facility];  Service: Endoscopy;;   BREAST EXCISIONAL BIOPSY  2010   Excisional biopsy of benign left breast mass -lopoma    BREAST SURGERY     left nreast biopsy   COLONOSCOPY  08/26/2011   SLF: 1. Internal hemorrhoids   COLONOSCOPY N/A 10/06/2015   VHQ:IONG fissure or internal hemorrhoids/mild sigmoid colitis. benign colonic path    COLONOSCOPY WITH PROPOFOL N/A 03/15/2021   Procedure: COLONOSCOPY WITH PROPOFOL;  Surgeon: [name], DO;  [facility]: [facility];  Service: Endoscopy;  Laterality: N/A;  3:00pm   colonscopy  2005   Dr. Katrinka Blazing   ESOPHAGOGASTRODUODENOSCOPY  12/2009   chronic gastritis    ESOPHAGOGASTRODUODENOSCOPY N/A 05/17/2017   Dr. Darrick Penna; gastritis, normal small bowel biopsies, multiple gastric polyps. fundic gland polyps   ESOPHAGOGASTRODUODENOSCOPY (EGD) WITH PROPOFOL N/A 04/21/2020   Rourk: multiple gastric polyps, biopsy of one c/w fundic gland. gastric biopsy showed mild chronic gastritis but no H.pylori   ESOPHAGOGASTRODUODENOSCOPY (EGD) WITH PROPOFOL N/A 04/20/2023   Procedure: ESOPHAGOGASTRODUODENOSCOPY (EGD) WITH PROPOFOL;  Surgeon: [name], DO;  [facility]: [facility];  Service: Endoscopy;  Laterality: N/A;  1:00 pm, asa 3, pt knows to arrive at 6:30   GIVENS CAPSULE STUDY N/A 05/30/2017   gastritis, no source for anemia identified   left knee surgery Left March 1,2017   PARTIAL KNEE ARTHROPLASTY Left 08/09/2017   Procedure: LEFT UNICOMPARTMENTAL KNEE;  Surgeon: Ollen Gross, MD;  Location: [facility];  Service: Orthopedics;  Laterality: Left;   POLYPECTOMY  04/21/2020   Procedure: POLYPECTOMY;  Surgeon: Corbin Ade, MD;  [facility]: [facility];  Service: Endoscopy;;  gastric   Resection of left lobe of thyroid     right carpal tunnel release     rt. knee athroscopy  2004   SAVORY DILATION N/A 05/17/2017   Procedure: SAVORY DILATION;  Surgeon: West Bali, MD;  Location: [facility];  Service: Endoscopy;  Laterality: N/A;   TOTAL ABDOMINAL HYSTERECTOMY  1994   UMBILICAL HERNIA REPAIR     Urethral dilation for stenosis  2009    Family History  Problem Relation Age of Onset   Pneumonia Mother    Healthy Father    Colon cancer Neg Hx    Anesthesia problems Neg Hx    Hypotension Neg Hx    Malignant hyperthermia Neg Hx    Pseudochol deficiency Neg Hx    Social History   Tobacco Use   Smoking status: Former    Current packs/day: 0.00    Average packs/day: 0.3 packs/day for 1 year (0.3 ttl pk-yrs)    Types: Cigarettes    Start date: 08/26/1971    Quit date: 08/25/1972    Years since quitting: 50.8    Passive exposure: Past   Smokeless  tobacco: Never  Vaping Use   Vaping status: Never Used  Substance Use Topics   Alcohol use: No   Drug use: No    Allergies  Allergen Reactions   Cozaar [Losartan Potassium] Cough   Dilaudid [Hydromorphone Hcl] Other (See Comments)    Confusion;altered state of mind   Levaquin [Levofloxacin] Other (See Comments)    Dry throat   Nsaids Other (See Comments)    per GI pt no longer to use NSAIDS    Promethazine     Legs itching, RESTLESS LEGS    No outpatient medications have been marked as taking for the 06/21/23 encounter (Office Visit) with Oliver Barre, MD.   Current Facility-Administered Medications for the 06/21/23 encounter (Office Visit) with Oliver Barre, MD  Medication   lidocaine (PF) (XYLOCAINE) 1 % injection 4 mL    Objective: BP 128/72   Pulse 64   Ht 5\' 3"  (1.6 m)   Wt 168 lb (76.2 kg)   BMI 29.76 kg/m   Physical Exam:  General: {General PE Findings:25791} Gait: {Gait:25792}    IMAGING: {XR Reviewed:24899}   New Medications:  No orders of the defined types were placed in this encounter.     Oliver Barre, MD  06/21/2023 3:35 PM

## 2023-06-22 ENCOUNTER — Telehealth: Payer: Self-pay | Admitting: Orthopedic Surgery

## 2023-06-22 MED ORDER — TRAMADOL HCL 50 MG PO TABS: 50.0000 mg | ORAL_TABLET | Freq: Four times a day (QID) | ORAL | 0 refills | Status: DC | PRN

## 2023-06-22 NOTE — Addendum Note (Signed)
Addended by: Thane Edu A on: 06/22/2023 11:30 AM   Modules accepted: Orders

## 2023-06-22 NOTE — Telephone Encounter (Signed)
Dr. Dallas Schimke pt - spoke w/the patient, she got an injection yesterday and is requesting Oxycodone to be called to [facility] on [facility].  She stated she needed something for pain so she could move around in the house.

## 2023-06-26 ENCOUNTER — Telehealth: Payer: Self-pay | Admitting: Orthopedic Surgery

## 2023-06-26 NOTE — Telephone Encounter (Signed)
Return if symptoms worsen or fail to improve. 

## 2023-06-26 NOTE — Telephone Encounter (Signed)
Dr. Dallas Schimke pt - pt lvm stating that her knee has gone down quite a bit, but is still painful, icing isn't helping

## 2023-06-28 ENCOUNTER — Ambulatory Visit: Payer: Medicare Other | Admitting: Orthopedic Surgery

## 2023-06-28 ENCOUNTER — Encounter: Payer: Self-pay | Admitting: Orthopedic Surgery

## 2023-06-28 DIAGNOSIS — M1711 Unilateral primary osteoarthritis, right knee: Secondary | ICD-10-CM

## 2023-06-28 DIAGNOSIS — M7051 Other bursitis of knee, right knee: Secondary | ICD-10-CM | POA: Diagnosis not present

## 2023-06-28 DIAGNOSIS — M7050 Other bursitis of knee, unspecified knee: Secondary | ICD-10-CM

## 2023-06-28 MED ORDER — METHYLPREDNISOLONE ACETATE 40 MG/ML IJ SUSP
40.0000 mg | Freq: Once | INTRAMUSCULAR | Status: AC
  Administered 2023-06-28: 40 mg via INTRA_ARTICULAR

## 2023-06-28 MED ORDER — CYCLOBENZAPRINE HCL 10 MG PO TABS: 10.0000 mg | ORAL_TABLET | Freq: Two times a day (BID) | ORAL | 0 refills | Status: DC | PRN

## 2023-06-28 MED ORDER — PREDNISONE 10 MG (21) PO TBPK: ORAL_TABLET | ORAL | 0 refills | Status: DC

## 2023-06-28 NOTE — Addendum Note (Signed)
Addended by: Debby Bud R on: 06/28/2023 03:22 PM   Modules accepted: Orders

## 2023-06-28 NOTE — Progress Notes (Signed)
New Patient Visit  Assessment: Laura [name] is a 72 y.o. female with the following: 1. Arthritis of right knee 2.  Pes anserine bursitis  Plan: Laura [name] has advanced degenerative changes in the right knee.  We took the right knee, and she had limited relief of her symptoms.  She continues to have pain in the knee.  This is preventing her from sleeping.  She is also complaining of pain pes anserine tendons.  We discussed injection of the Pes anserine bursa, and this was completed in clinic.  In addition, I provided her with a prednisone Dosepak, as well as some Flexeril to help her sleep.  Pain is possible that the steroid has not kicked in from her injection 1 week ago.  She states understanding.  She will follow-up as needed.  Procedure note injection Right Pes Anserine bursa   Verbal consent was obtained to inject the right knee joint  Timeout was completed to confirm the site of injection.  The skin was prepped with alcohol and ethyl chloride was sprayed at the injection site.  A 21-gauge needle was used to inject 40 mg of Depo-Medrol and 1% lidocaine (1 cc) into the right knee pes anserine bursa using a direct approach.  There were no complications. A sterile bandage was applied.   Follow-up: Return if symptoms worsen or fail to improve.  Subjective:  Chief Complaint  Patient presents with   Knee Pain    R knee pain didn't get any better with the injection 1 wk ago. Feels a nodule in the knee that's becoming more painful.    History of Present Illness: Laura [name] is a 72 y.o. female who returns to clinic for repeat evaluation of right knee pain.  I saw her in clinic a week ago.  I injected her right knee.  She has had limited improvement in her symptoms.  She is complaining of pain in the posterior knee, as well as over the pes anserine tendons.  This is affecting her sleep.  No new injuries.  She cannot take NSAIDs.  Review of Systems: No fevers or chills No numbness  or tingling No chest pain No shortness of breath No bowel or bladder dysfunction No GI distress No headaches   Objective: There were no vitals taken for this visit.  Physical Exam:  General: Alert and oriented. and No acute distress. Gait: Right sided antalgic gait.  Evaluation of the right knee demonstrates a mild effusion.  No obvious malalignment.  She has tenderness palpation in the lateral joint line.  Exquisite tenderness palpation over the medial joint line.  She can get to full extension.  She tolerates flexion beyond 90 degrees.  She has tenderness to palpation Achilles tendon, without an obvious Baker's cyst.  She has some tenderness palpation over the Pes anserine tendons.  IMAGING: No new imaging obtained today   New Medications:  Meds ordered this encounter  Medications   predniSONE (STERAPRED UNI-PAK 21 TAB) 10 MG (21) TBPK tablet    Sig: 10 mg DS 12 as directed    Dispense:  48 tablet    Refill:  0   cyclobenzaprine (FLEXERIL) 10 MG tablet    Sig: Take 1 tablet (10 mg total) by mouth 2 (two) times daily as needed.    Dispense:  20 tablet    Refill:  0      Oliver Barre, MD  06/28/2023 9:38 AM

## 2023-07-04 ENCOUNTER — Encounter: Payer: Self-pay | Admitting: *Deleted

## 2023-07-04 ENCOUNTER — Other Ambulatory Visit: Payer: Self-pay | Admitting: *Deleted

## 2023-07-04 ENCOUNTER — Ambulatory Visit (INDEPENDENT_AMBULATORY_CARE_PROVIDER_SITE_OTHER): Payer: Medicare Other | Admitting: Gastroenterology

## 2023-07-04 ENCOUNTER — Other Ambulatory Visit: Payer: Self-pay | Admitting: "Endocrinology

## 2023-07-04 ENCOUNTER — Encounter: Payer: Self-pay | Admitting: Gastroenterology

## 2023-07-04 ENCOUNTER — Telehealth: Payer: Self-pay | Admitting: Gastroenterology

## 2023-07-04 ENCOUNTER — Other Ambulatory Visit: Payer: Self-pay | Admitting: Family Medicine

## 2023-07-04 VITALS — BP 126/74 | HR 74 | Temp 97.3°F | Ht 63.0 in | Wt 170.8 lb

## 2023-07-04 DIAGNOSIS — E890 Postprocedural hypothyroidism: Secondary | ICD-10-CM

## 2023-07-04 DIAGNOSIS — K219 Gastro-esophageal reflux disease without esophagitis: Secondary | ICD-10-CM

## 2023-07-04 DIAGNOSIS — D132 Benign neoplasm of duodenum: Secondary | ICD-10-CM

## 2023-07-04 NOTE — H&P (View-Only) (Signed)
Gastroenterology Office Note     Primary Care Physician:  Kerri Perches, MD  Primary Gastroenterologist: Dr. Marletta Lor    Chief Complaint   Chief Complaint  Patient presents with   Follow-up    Patient here today for a follow up appointment. Patient denies any current gi issues. Laura [name] is controlled with omeprazole 20 mg bid.     History of Present Illness   Laura [name] is a 72 y.o. female presenting today with a history of  chronic abdominal pain, nocturnal flushing, weight loss, and work-up as outlined below negative for carcinoid, chronic constipation, GERD. Givens capsule in 2018 with gastritis.  Due to recurrent dyspepsia, she underwent EGD June 2024. Findings of gastritis and 10 mm duodenal polyp with path noting adenoma.   No abdominal pain, N/V, changes in bowel habits, constipation, diarrhea, overt GI bleeding, GERD, dysphagia, unexplained weight loss, lack of appetite, unexplained weight gain.   She has good control of GERD on omeprazole BID.     EGD June 2024: normal esophagus, one 10 mm duodenal polyp, gastritis. Of note, 2 pieces of gastric mucosa were inadvertently put into jar 1 with duodenal polyp biopsies. Duodenal adenoma.   Colonoscopy 2022 : fair prep, hemorrhoids, diverticulosis, stool in entire colon but cleared with copious amounts of water.    EGD June 2021 with a few fundic gland polyps, otherwise unremarkable.  Biopsies did show chronic gastritis negative for H. pylori.    Additional workup has included:  CT June 2021 with no significant findings.  HIDA September 2021 with slightly low EF at 27%.  labs that had been completed included catecholamines,metanephrines, ESR, CRP, tryptase all of which were ok. 5 HIAA elevated at 13.6. TSH borderline low. CT A/P with and without contrast 10/06/20 with no acute findings.  NM PET with Dotatate 11/13/2020 with no evidence of well-differentiated neuroendocrine tumor within the abdomen or pelvis.  No  evidence of metastatic disease. CTA: no occlusive disease   Past Medical History:  Diagnosis Date   Anxiety    At low risk for fall 08/15/2018   Cystitis    Depression    Gastritis    GERD (gastroesophageal reflux disease)    Hashimoto's thyroiditis    Hx    Hyperlipidemia    Hypertension    Hypothyroidism    Normocytic anemia 2009 Hb 9.9-11.1   Osteopenia    Shoulder pain    Urinary incontinence     Past Surgical History:  Procedure Laterality Date   Bilateral tubal ligation     BIOPSY  04/21/2020   Procedure: BIOPSY;  Surgeon: Corbin Ade, MD;  Location: [facility];  Service: Endoscopy;;   BIOPSY  04/20/2023   Procedure: BIOPSY;  Surgeon: [name], DO;  Location: [facility];  Service: Endoscopy;;   BREAST EXCISIONAL BIOPSY  2010   Excisional biopsy of benign left breast mass -lopoma    BREAST SURGERY     left nreast biopsy   COLONOSCOPY  08/26/2011   SLF: 1. Internal hemorrhoids   COLONOSCOPY N/A 10/06/2015   ZOX:WRUE fissure or internal hemorrhoids/mild sigmoid colitis. benign colonic path    COLONOSCOPY WITH PROPOFOL N/A 03/15/2021   Procedure: COLONOSCOPY WITH PROPOFOL;  Surgeon: [name], DO;  Location: [facility];  Service: Endoscopy;  Laterality: N/A;  3:00pm   colonscopy  2005   Dr. Katrinka Blazing   ESOPHAGOGASTRODUODENOSCOPY  12/2009   chronic gastritis   ESOPHAGOGASTRODUODENOSCOPY N/A 05/17/2017   Dr. Darrick Penna; gastritis,  normal small bowel biopsies, multiple gastric polyps. fundic gland polyps   ESOPHAGOGASTRODUODENOSCOPY (EGD) WITH PROPOFOL N/A 04/21/2020   Rourk: multiple gastric polyps, biopsy of one c/w fundic gland. gastric biopsy showed mild chronic gastritis but no H.pylori   ESOPHAGOGASTRODUODENOSCOPY (EGD) WITH PROPOFOL N/A 04/20/2023   Procedure: ESOPHAGOGASTRODUODENOSCOPY (EGD) WITH PROPOFOL;  Surgeon: [name], DO;  Location: [facility];  Service: Endoscopy;  Laterality: N/A;  1:00 pm, asa 3, pt knows to arrive at 6:30    GIVENS CAPSULE STUDY N/A 05/30/2017   gastritis, no source for anemia identified   left knee surgery Left March 1,2017   PARTIAL KNEE ARTHROPLASTY Left 08/09/2017   Procedure: LEFT UNICOMPARTMENTAL KNEE;  Surgeon: Ollen Gross, MD;  Location: [facility];  Service: Orthopedics;  Laterality: Left;   POLYPECTOMY  04/21/2020   Procedure: POLYPECTOMY;  Surgeon: Corbin Ade, MD;  Location: [facility];  Service: Endoscopy;;  gastric   Resection of left lobe of thyroid     right carpal tunnel release     rt. knee athroscopy  2004   SAVORY DILATION N/A 05/17/2017   Procedure: SAVORY DILATION;  Surgeon: West Bali, MD;  Location: [facility];  Service: Endoscopy;  Laterality: N/A;   TOTAL ABDOMINAL HYSTERECTOMY  1994   UMBILICAL HERNIA REPAIR     Urethral dilation for stenosis  2009    Current Outpatient Medications  Medication Sig Dispense Refill   acetaminophen (TYLENOL) 500 MG tablet Take 1 tablet (500 mg total) by mouth every 6 (six) hours as needed. (Patient taking differently: Take 1,500 mg by mouth daily as needed for mild pain or headache.) 30 tablet 0   amLODipine (NORVASC) 5 MG tablet Take 1 tablet (5 mg total) by mouth daily. 30 tablet 3   buPROPion (WELLBUTRIN XL) 150 MG 24 hr tablet Take one tablet by mouth once daily 30 tablet 5   Calcium Carbonate-Vit D-Min (CALCIUM 1200) 1200-1000 MG-UNIT CHEW Take one tablet by mouth two times daily 60 tablet 5   citalopram (CELEXA) 40 MG tablet TAKE 1 TABLET BY MOUTH DAILY 90 tablet 3   cyclobenzaprine (FLEXERIL) 10 MG tablet Take 1 tablet (10 mg total) by mouth 2 (two) times daily as needed. 20 tablet 0   fenofibrate (TRICOR) 48 MG tablet TAKE 1 TABLET BY MOUTH DAILY 90 tablet 3   fluticasone (FLONASE) 50 MCG/ACT nasal spray Place 1 spray into both nostrils daily as needed for allergies or rhinitis.     furosemide (LASIX) 20 MG tablet TAKE 1 TABLET(20 MG) BY MOUTH DAILY 30 tablet 1   gabapentin (NEURONTIN) 100 MG capsule Take two  capsules at bedtime for neuropathic pain 100 capsule 3   hydrochlorothiazide (HYDRODIURIL) 25 MG tablet TAKE 1 TABLET BY MOUTH DAILY 100 tablet 2   levocetirizine (XYZAL) 5 MG tablet Take 5 mg by mouth every evening.     levothyroxine (SYNTHROID) 88 MCG tablet TAKE 1 TABLET BY MOUTH DAILY  BEFORE BREAKFAST 90 tablet 1   montelukast (SINGULAIR) 10 MG tablet Take 1 tablet (10 mg total) by mouth at bedtime. 30 tablet 3   Multiple Vitamin (MULTIVITAMIN WITH MINERALS) TABS tablet Take 1 tablet by mouth daily.     omeprazole (PRILOSEC) 20 MG capsule TAKE 1 CAPSULE BY MOUTH TWICE  DAILY BEFORE A MEAL 200 capsule 2   Polyethyl Glycol-Propyl Glycol (SYSTANE OP) Place 1 drop into both eyes daily as needed (dry eyes).     potassium chloride SA (KLOR-CON M) 20 MEQ tablet Take  two tablets three times daily for 5 days , then take two tablets twice daily 60 tablet 0   predniSONE (STERAPRED UNI-PAK 21 TAB) 10 MG (21) TBPK tablet 10 mg DS 12 as directed 48 tablet 0   rosuvastatin (CRESTOR) 40 MG tablet TAKE 1 TABLET(40 MG) BY MOUTH DAILY 90 tablet 0   traMADol (ULTRAM) 50 MG tablet Take 1 tablet (50 mg total) by mouth every 6 (six) hours as needed. 30 tablet 0   Current Facility-Administered Medications  Medication Dose Route Frequency Provider Last Rate Last Admin   lidocaine (PF) (XYLOCAINE) 1 % injection 4 mL  4 mL Other Once Gardenia Phlegm, MD        Allergies as of 07/04/2023 - Review Complete 07/04/2023  Allergen Reaction Noted   Cozaar [losartan potassium] Cough 07/04/2018   Dilaudid [hydromorphone hcl] Other (See Comments) 03/01/2011   Levaquin [levofloxacin] Other (See Comments) 07/28/2014   Nsaids Other (See Comments) 01/13/2010   Promethazine  05/17/2017    Family History  Problem Relation Age of Onset   Pneumonia Mother    Healthy Father    Colon cancer Neg Hx    Anesthesia problems Neg Hx    Hypotension Neg Hx    Malignant hyperthermia Neg Hx    Pseudochol deficiency Neg Hx      Social History   Socioeconomic History   Marital status: Married    Spouse name: Not on file   Number of children: 3   Years of education: Not on file   Highest education level: Not on file  Occupational History   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   Tobacco Use   Smoking status: Former    Current packs/day: 0.00    Average packs/day: 0.3 packs/day for 1 year (0.3 ttl pk-yrs)    Types: Cigarettes    Start date: 08/26/1971    Quit date: 08/25/1972    Years since quitting: 50.8    Passive exposure: Past   Smokeless tobacco: Never  Vaping Use   Vaping status: Never Used  Substance and Sexual Activity   Alcohol use: No   Drug use: No   Sexual activity: Not Currently    Birth control/protection: Surgical    Comment: hyst  Other Topics Concern   Not on file  Social History Narrative   Not on file   Social Determinants of Health   Financial Resource Strain: Low Risk  (08/30/2021)   Overall Financial Resource Strain (CARDIA)    Difficulty of Paying Living Expenses: Not hard at all  Food Insecurity: No Food Insecurity (08/30/2021)   Hunger Vital Sign    Worried About Running Out of Food in the Last Year: Never true    Ran Out of Food in the Last Year: Never true  Transportation Needs: No Transportation Needs (08/30/2021)   PRAPARE - Administrator, Civil Service (Medical): No    Lack of Transportation (Non-Medical): No  Physical Activity: Inactive (08/30/2021)   Exercise Vital Sign    Days of Exercise per Week: 0 days    Minutes of Exercise per Session: 0 min  Stress: No Stress Concern Present (08/30/2021)   [employer] of Occupational Health - Occupational Stress Questionnaire    Feeling of Stress : Not at all  Social Connections: Moderately Integrated (08/30/2021)   Social Connection and Isolation Panel [NHANES]    Frequency of Communication with Friends and Family: More than three times a week    Frequency of Social Gatherings  with Friends and Family: Twice a week    Attends Religious Services: More than 4 times per year    Active Member of Clubs or Organizations: No    Attends Banker Meetings: Never    Marital Status: Married  Catering manager Violence: Not At Risk (08/30/2021)   Humiliation, Afraid, Rape, and Kick questionnaire    Fear of Current or Ex-Partner: No    Emotionally Abused: No    Physically Abused: No    Sexually Abused: No     Review of Systems   Gen: Denies any fever, chills, fatigue, weight loss, lack of appetite.  CV: Denies chest pain, heart palpitations, peripheral edema, syncope.  Resp: Denies shortness of breath at rest or with exertion. Denies wheezing or cough.  GI: Denies dysphagia or odynophagia. Denies jaundice, hematemesis, fecal incontinence. GU : Denies urinary burning, urinary frequency, urinary hesitancy MS: Denies joint pain, muscle weakness, cramps, or limitation of movement.  Derm: Denies rash, itching, dry skin Psych: Denies depression, anxiety, memory loss, and confusion Heme: Denies bruising, bleeding, and enlarged lymph nodes.   Physical Exam   BP 126/74 (BP Location: Left Arm, Patient Position: Sitting, Cuff Size: Large)   Pulse 74   Temp (!) 97.3 F (36.3 C) (Temporal)   Ht 5\' 3"  (1.6 m)   Wt 170 lb 12.8 oz (77.5 kg)   BMI 30.26 kg/m  General:   Alert and oriented. Pleasant and cooperative. Well-nourished and well-developed.  Head:  Normocephalic and atraumatic. Eyes:  Without icterus Abdomen:  +BS, soft, non-tender and non-distended. No HSM noted. No guarding or rebound. No masses appreciated.  Rectal:  Deferred  Msk:  Symmetrical without gross deformities. Normal posture. Extremities:  Without edema. Neurologic:  Alert and  oriented x4;  grossly normal neurologically. Skin:  Intact without significant lesions or rashes. Psych:  Alert and cooperative. Normal mood and affect.   Assessment   Laura [name] is a 72 y.o. female  presenting today with a history of chronic abdominal pain s/p extensive evaluation as above, GERD, dyspepsia, following up after EGD June 2024.  GERD well-controlled at this time on BID PPI. We discussed decreasing to lowest dose that controls symptoms.  Duodenal polyp: 10 mm adenoma on EGD June 2024. Will need to discuss surveillance with attending.   Colonoscopy due in 2032. No concerning lower GI signs/symptoms.     PLAN    Decrease omeprazole to once daily if tolerated 1 year follow-up in office Discussing with Dr. Marletta Lor next surveillance due to duodenal adenoma   Gelene Mink, PhD, ANP-BC [facility]    Discussed with Dr. Marletta Lor: duodenal polyp located third portion of duodenum. Needs enteroscopy for removal. Will arrange for patient. ASA 2. BMP prior.

## 2023-07-04 NOTE — Patient Instructions (Signed)
I am so glad you are doing well!  You can decrease omeprazole to once a day, 30 minutes before breakfast, as long as this controls your symptoms.  We will see you back in 1 year!  I am talking with Dr. Marletta Lor about when you need your next endoscopy as you had a small polyp found.   I enjoyed seeing you again today! I value our relationship and want to provide genuine, compassionate, and quality care. You may receive a survey regarding your visit with me, and I welcome your feedback! Thanks so much for taking the time to complete this. I look forward to seeing you again.      Gelene Mink, PhD, ANP-BC [facility]

## 2023-07-04 NOTE — Progress Notes (Addendum)
Gastroenterology Office Note     Primary Care Physician:  Kerri Perches, MD  Primary Gastroenterologist: Dr. Marletta Lor    Chief Complaint   Chief Complaint  Patient presents with   Follow-up    Patient here today for a follow up appointment. Patient denies any current gi issues. Genella Rife is controlled with omeprazole 20 mg bid.     History of Present Illness   Laura [name] is a 72 y.o. female presenting today with a history of  chronic abdominal pain, nocturnal flushing, weight loss, and work-up as outlined below negative for carcinoid, chronic constipation, GERD. Givens capsule in 2018 with gastritis.  Due to recurrent dyspepsia, she underwent EGD June 2024. Findings of gastritis and 10 mm duodenal polyp with path noting adenoma.   No abdominal pain, N/V, changes in bowel habits, constipation, diarrhea, overt GI bleeding, GERD, dysphagia, unexplained weight loss, lack of appetite, unexplained weight gain.   She has good control of GERD on omeprazole BID.     EGD June 2024: normal esophagus, one 10 mm duodenal polyp, gastritis. Of note, 2 pieces of gastric mucosa were inadvertently put into jar 1 with duodenal polyp biopsies. Duodenal adenoma.   Colonoscopy 2022 : fair prep, hemorrhoids, diverticulosis, stool in entire colon but cleared with copious amounts of water.    EGD June 2021 with a few fundic gland polyps, otherwise unremarkable.  Biopsies did show chronic gastritis negative for H. pylori.    Additional workup has included:  CT June 2021 with no significant findings.  HIDA September 2021 with slightly low EF at 27%.  labs that had been completed included catecholamines,metanephrines, ESR, CRP, tryptase all of which were ok. 5 HIAA elevated at 13.6. TSH borderline low. CT A/P with and without contrast 10/06/20 with no acute findings.  NM PET with Dotatate 11/13/2020 with no evidence of well-differentiated neuroendocrine tumor within the abdomen or pelvis.  No  evidence of metastatic disease. CTA: no occlusive disease   Past Medical History:  Diagnosis Date   Anxiety    At low risk for fall 08/15/2018   Cystitis    Depression    Gastritis    GERD (gastroesophageal reflux disease)    Hashimoto's thyroiditis    Hx    Hyperlipidemia    Hypertension    Hypothyroidism    Normocytic anemia 2009 Hb 9.9-11.1   Osteopenia    Shoulder pain    Urinary incontinence     Past Surgical History:  Procedure Laterality Date   Bilateral tubal ligation     BIOPSY  04/21/2020   Procedure: BIOPSY;  Surgeon: Corbin Ade, MD;  [facility]: [facility];  Service: Endoscopy;;   BIOPSY  04/20/2023   Procedure: BIOPSY;  Surgeon: [name], DO;  Location: [facility];  Service: Endoscopy;;   BREAST EXCISIONAL BIOPSY  2010   Excisional biopsy of benign left breast mass -lopoma    BREAST SURGERY     left nreast biopsy   COLONOSCOPY  08/26/2011   SLF: 1. Internal hemorrhoids   COLONOSCOPY N/A 10/06/2015   UJW:JXBJ fissure or internal hemorrhoids/mild sigmoid colitis. benign colonic path    COLONOSCOPY WITH PROPOFOL N/A 03/15/2021   Procedure: COLONOSCOPY WITH PROPOFOL;  Surgeon: [name], DO;  [facility]: [facility];  Service: Endoscopy;  Laterality: N/A;  3:00pm   colonscopy  2005   Dr. Katrinka Blazing   ESOPHAGOGASTRODUODENOSCOPY  12/2009   chronic gastritis   ESOPHAGOGASTRODUODENOSCOPY N/A 05/17/2017   Dr. Darrick Penna; gastritis,  normal small bowel biopsies, multiple gastric polyps. fundic gland polyps   ESOPHAGOGASTRODUODENOSCOPY (EGD) WITH PROPOFOL N/A 04/21/2020   Rourk: multiple gastric polyps, biopsy of one c/w fundic gland. gastric biopsy showed mild chronic gastritis but no H.pylori   ESOPHAGOGASTRODUODENOSCOPY (EGD) WITH PROPOFOL N/A 04/20/2023   Procedure: ESOPHAGOGASTRODUODENOSCOPY (EGD) WITH PROPOFOL;  Surgeon: [name], DO;  [facility]: [facility];  Service: Endoscopy;  Laterality: N/A;  1:00 pm, asa 3, pt knows to arrive at 6:30    GIVENS CAPSULE STUDY N/A 05/30/2017   gastritis, no source for anemia identified   left knee surgery Left March 1,2017   PARTIAL KNEE ARTHROPLASTY Left 08/09/2017   Procedure: LEFT UNICOMPARTMENTAL KNEE;  Surgeon: Ollen Gross, MD;  Location: [facility];  Service: Orthopedics;  Laterality: Left;   POLYPECTOMY  04/21/2020   Procedure: POLYPECTOMY;  Surgeon: Corbin Ade, MD;  [facility]: [facility];  Service: Endoscopy;;  gastric   Resection of left lobe of thyroid     right carpal tunnel release     rt. knee athroscopy  2004   SAVORY DILATION N/A 05/17/2017   Procedure: SAVORY DILATION;  Surgeon: West Bali, MD;  Location: [facility];  Service: Endoscopy;  Laterality: N/A;   TOTAL ABDOMINAL HYSTERECTOMY  1994   UMBILICAL HERNIA REPAIR     Urethral dilation for stenosis  2009    Current Outpatient Medications  Medication Sig Dispense Refill   acetaminophen (TYLENOL) 500 MG tablet Take 1 tablet (500 mg total) by mouth every 6 (six) hours as needed. (Patient taking differently: Take 1,500 mg by mouth daily as needed for mild pain or headache.) 30 tablet 0   amLODipine (NORVASC) 5 MG tablet Take 1 tablet (5 mg total) by mouth daily. 30 tablet 3   buPROPion (WELLBUTRIN XL) 150 MG 24 hr tablet Take one tablet by mouth once daily 30 tablet 5   Calcium Carbonate-Vit D-Min (CALCIUM 1200) 1200-1000 MG-UNIT CHEW Take one tablet by mouth two times daily 60 tablet 5   citalopram (CELEXA) 40 MG tablet TAKE 1 TABLET BY MOUTH DAILY 90 tablet 3   cyclobenzaprine (FLEXERIL) 10 MG tablet Take 1 tablet (10 mg total) by mouth 2 (two) times daily as needed. 20 tablet 0   fenofibrate (TRICOR) 48 MG tablet TAKE 1 TABLET BY MOUTH DAILY 90 tablet 3   fluticasone (FLONASE) 50 MCG/ACT nasal spray Place 1 spray into both nostrils daily as needed for allergies or rhinitis.     furosemide (LASIX) 20 MG tablet TAKE 1 TABLET(20 MG) BY MOUTH DAILY 30 tablet 1   gabapentin (NEURONTIN) 100 MG capsule Take two  capsules at bedtime for neuropathic pain 100 capsule 3   hydrochlorothiazide (HYDRODIURIL) 25 MG tablet TAKE 1 TABLET BY MOUTH DAILY 100 tablet 2   levocetirizine (XYZAL) 5 MG tablet Take 5 mg by mouth every evening.     levothyroxine (SYNTHROID) 88 MCG tablet TAKE 1 TABLET BY MOUTH DAILY  BEFORE BREAKFAST 90 tablet 1   montelukast (SINGULAIR) 10 MG tablet Take 1 tablet (10 mg total) by mouth at bedtime. 30 tablet 3   Multiple Vitamin (MULTIVITAMIN WITH MINERALS) TABS tablet Take 1 tablet by mouth daily.     omeprazole (PRILOSEC) 20 MG capsule TAKE 1 CAPSULE BY MOUTH TWICE  DAILY BEFORE A MEAL 200 capsule 2   Polyethyl Glycol-Propyl Glycol (SYSTANE OP) Place 1 drop into both eyes daily as needed (dry eyes).     potassium chloride SA (KLOR-CON M) 20 MEQ tablet Take  two tablets three times daily for 5 days , then take two tablets twice daily 60 tablet 0   predniSONE (STERAPRED UNI-PAK 21 TAB) 10 MG (21) TBPK tablet 10 mg DS 12 as directed 48 tablet 0   rosuvastatin (CRESTOR) 40 MG tablet TAKE 1 TABLET(40 MG) BY MOUTH DAILY 90 tablet 0   traMADol (ULTRAM) 50 MG tablet Take 1 tablet (50 mg total) by mouth every 6 (six) hours as needed. 30 tablet 0   Current Facility-Administered Medications  Medication Dose Route Frequency Provider Last Rate Last Admin   lidocaine (PF) (XYLOCAINE) 1 % injection 4 mL  4 mL Other Once Gardenia Phlegm, MD        Allergies as of 07/04/2023 - Review Complete 07/04/2023  Allergen Reaction Noted   Cozaar [losartan potassium] Cough 07/04/2018   Dilaudid [hydromorphone hcl] Other (See Comments) 03/01/2011   Levaquin [levofloxacin] Other (See Comments) 07/28/2014   Nsaids Other (See Comments) 01/13/2010   Promethazine  05/17/2017    Family History  Problem Relation Age of Onset   Pneumonia Mother    Healthy Father    Colon cancer Neg Hx    Anesthesia problems Neg Hx    Hypotension Neg Hx    Malignant hyperthermia Neg Hx    Pseudochol deficiency Neg Hx      Social History   Socioeconomic History   Marital status: Married    Spouse name: Not on file   Number of children: 3   Years of education: Not on file   Highest education level: Not on file  Occupational History   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   Tobacco Use   Smoking status: Former    Current packs/day: 0.00    Average packs/day: 0.3 packs/day for 1 year (0.3 ttl pk-yrs)    Types: Cigarettes    Start date: 08/26/1971    Quit date: 08/25/1972    Years since quitting: 50.8    Passive exposure: Past   Smokeless tobacco: Never  Vaping Use   Vaping status: Never Used  Substance and Sexual Activity   Alcohol use: No   Drug use: No   Sexual activity: Not Currently    Birth control/protection: Surgical    Comment: hyst  Other Topics Concern   Not on file  Social History Narrative   Not on file   Social Determinants of Health   Financial Resource Strain: Low Risk  (08/30/2021)   Overall Financial Resource Strain (CARDIA)    Difficulty of Paying Living Expenses: Not hard at all  Food Insecurity: No Food Insecurity (08/30/2021)   Hunger Vital Sign    Worried About Running Out of Food in the Last Year: Never true    Ran Out of Food in the Last Year: Never true  Transportation Needs: No Transportation Needs (08/30/2021)   PRAPARE - Administrator, Civil Service (Medical): No    Lack of Transportation (Non-Medical): No  Physical Activity: Inactive (08/30/2021)   Exercise Vital Sign    Days of Exercise per Week: 0 days    Minutes of Exercise per Session: 0 min  Stress: No Stress Concern Present (08/30/2021)   [employer] of Occupational Health - Occupational Stress Questionnaire    Feeling of Stress : Not at all  Social Connections: Moderately Integrated (08/30/2021)   Social Connection and Isolation Panel [NHANES]    Frequency of Communication with Friends and Family: More than three times a week    Frequency of Social Gatherings  with Friends and Family: Twice a week    Attends Religious Services: More than 4 times per year    Active Member of Clubs or Organizations: No    Attends Banker Meetings: Never    Marital Status: Married  Catering manager Violence: Not At Risk (08/30/2021)   Humiliation, Afraid, Rape, and Kick questionnaire    Fear of Current or Ex-Partner: No    Emotionally Abused: No    Physically Abused: No    Sexually Abused: No     Review of Systems   Gen: Denies any fever, chills, fatigue, weight loss, lack of appetite.  CV: Denies chest pain, heart palpitations, peripheral edema, syncope.  Resp: Denies shortness of breath at rest or with exertion. Denies wheezing or cough.  GI: Denies dysphagia or odynophagia. Denies jaundice, hematemesis, fecal incontinence. GU : Denies urinary burning, urinary frequency, urinary hesitancy MS: Denies joint pain, muscle weakness, cramps, or limitation of movement.  Derm: Denies rash, itching, dry skin Psych: Denies depression, anxiety, memory loss, and confusion Heme: Denies bruising, bleeding, and enlarged lymph nodes.   Physical Exam   BP 126/74 (BP Location: Left Arm, Patient Position: Sitting, Cuff Size: Large)   Pulse 74   Temp (!) 97.3 F (36.3 C) (Temporal)   Ht 5\' 3"  (1.6 m)   Wt 170 lb 12.8 oz (77.5 kg)   BMI 30.26 kg/m  General:   Alert and oriented. Pleasant and cooperative. Well-nourished and well-developed.  Head:  Normocephalic and atraumatic. Eyes:  Without icterus Abdomen:  +BS, soft, non-tender and non-distended. No HSM noted. No guarding or rebound. No masses appreciated.  Rectal:  Deferred  Msk:  Symmetrical without gross deformities. Normal posture. Extremities:  Without edema. Neurologic:  Alert and  oriented x4;  grossly normal neurologically. Skin:  Intact without significant lesions or rashes. Psych:  Alert and cooperative. Normal mood and affect.   Assessment   Laura [name] is a 72 y.o. female  presenting today with a history of chronic abdominal pain s/p extensive evaluation as above, GERD, dyspepsia, following up after EGD June 2024.  GERD well-controlled at this time on BID PPI. We discussed decreasing to lowest dose that controls symptoms.  Duodenal polyp: 10 mm adenoma on EGD June 2024. Will need to discuss surveillance with attending.   Colonoscopy due in 2032. No concerning lower GI signs/symptoms.     PLAN    Decrease omeprazole to once daily if tolerated 1 year follow-up in office Discussing with Dr. Marletta Lor next surveillance due to duodenal adenoma   Gelene Mink, PhD, ANP-BC [facility]    Discussed with Dr. Marletta Lor: duodenal polyp located third portion of duodenum. Needs enteroscopy for removal. Will arrange for patient. ASA 2. BMP prior.

## 2023-07-04 NOTE — Telephone Encounter (Signed)
Pt has been scheduled for 07/25/23. Instructions mailed to pt.

## 2023-07-04 NOTE — Telephone Encounter (Signed)
Hi! Patient needs enteroscopy with Dr. Marletta Lor due to duodenal adenoma. ASA 2. BMP prior. Please arrange, thanks!

## 2023-07-10 ENCOUNTER — Other Ambulatory Visit (HOSPITAL_COMMUNITY)
Admission: RE | Admit: 2023-07-10 | Discharge: 2023-07-10 | Disposition: A | Discharge status: Home / Self Care | Payer: Medicare Other | Source: Ambulatory Visit | Attending: Internal Medicine | Admitting: Internal Medicine

## 2023-07-10 DIAGNOSIS — D132 Benign neoplasm of duodenum: Secondary | ICD-10-CM | POA: Insufficient documentation

## 2023-07-10 LAB — BASIC METABOLIC PANEL
Anion gap: 8 (ref 5–15)
BUN: 15 mg/dL (ref 8–23)
CO2: 29 mmol/L (ref 22–32)
Calcium: 9 mg/dL (ref 8.9–10.3)
Chloride: 97 mmol/L — ABNORMAL LOW (ref 98–111)
Creatinine, Ser: 0.83 mg/dL (ref 0.44–1.00)
GFR, Estimated: >60 mL/min (ref >60)
Glucose, Bld: 96 mg/dL (ref 70–99)
Potassium: 3.1 mmol/L — ABNORMAL LOW (ref 3.5–5.1)
Sodium: 134 mmol/L — ABNORMAL LOW (ref 135–145)

## 2023-07-20 ENCOUNTER — Telehealth: Payer: Self-pay

## 2023-07-20 ENCOUNTER — Other Ambulatory Visit: Payer: Self-pay | Admitting: Family Medicine

## 2023-07-20 NOTE — Telephone Encounter (Signed)
Laura [name]  Pt called to tell us that her MyChart is not working so if we have to call her please call [phone] (Her home number).

## 2023-07-25 ENCOUNTER — Ambulatory Visit (HOSPITAL_COMMUNITY): Payer: Medicare Other | Admitting: Anesthesiology

## 2023-07-25 ENCOUNTER — Ambulatory Visit (HOSPITAL_COMMUNITY)
Admission: RE | Admit: 2023-07-25 | Discharge: 2023-07-25 | Disposition: A | Discharge status: Home / Self Care | Payer: Medicare Other | Attending: Internal Medicine | Admitting: Internal Medicine

## 2023-07-25 ENCOUNTER — Encounter (HOSPITAL_COMMUNITY): Payer: Self-pay

## 2023-07-25 ENCOUNTER — Other Ambulatory Visit: Payer: Self-pay

## 2023-07-25 ENCOUNTER — Encounter (HOSPITAL_COMMUNITY)
Admission: RE | Disposition: A | Discharge status: Home / Self Care | Payer: Self-pay | Source: Home / Self Care | Attending: Internal Medicine

## 2023-07-25 DIAGNOSIS — G709 Myoneural disorder, unspecified: Secondary | ICD-10-CM | POA: Diagnosis not present

## 2023-07-25 DIAGNOSIS — M1990 Unspecified osteoarthritis, unspecified site: Secondary | ICD-10-CM | POA: Diagnosis not present

## 2023-07-25 DIAGNOSIS — Z87891 Personal history of nicotine dependence: Secondary | ICD-10-CM | POA: Insufficient documentation

## 2023-07-25 DIAGNOSIS — D1330 Benign neoplasm of unspecified part of small intestine: Secondary | ICD-10-CM | POA: Diagnosis not present

## 2023-07-25 DIAGNOSIS — E063 Autoimmune thyroiditis: Secondary | ICD-10-CM | POA: Diagnosis not present

## 2023-07-25 DIAGNOSIS — K219 Gastro-esophageal reflux disease without esophagitis: Secondary | ICD-10-CM | POA: Insufficient documentation

## 2023-07-25 DIAGNOSIS — F418 Other specified anxiety disorders: Secondary | ICD-10-CM | POA: Diagnosis not present

## 2023-07-25 DIAGNOSIS — K317 Polyp of stomach and duodenum: Secondary | ICD-10-CM | POA: Diagnosis not present

## 2023-07-25 DIAGNOSIS — I10 Essential (primary) hypertension: Secondary | ICD-10-CM | POA: Insufficient documentation

## 2023-07-25 DIAGNOSIS — D132 Benign neoplasm of duodenum: Secondary | ICD-10-CM

## 2023-07-25 DIAGNOSIS — E039 Hypothyroidism, unspecified: Secondary | ICD-10-CM | POA: Diagnosis not present

## 2023-07-25 HISTORY — PX: ENTEROSCOPY: SHX5533

## 2023-07-25 SURGERY — ENTEROSCOPY
Anesthesia: General

## 2023-07-25 MED ORDER — LIDOCAINE HCL 1 % IJ SOLN
INTRAMUSCULAR | Status: DC | PRN
  Administered 2023-07-25: 50 mg via INTRADERMAL

## 2023-07-25 MED ORDER — PROPOFOL 500 MG/50ML IV EMUL
INTRAVENOUS | Status: DC | PRN
  Administered 2023-07-25: 150 ug/kg/min via INTRAVENOUS

## 2023-07-25 MED ORDER — PROPOFOL 10 MG/ML IV BOLUS
INTRAVENOUS | Status: DC | PRN
  Administered 2023-07-25 (×2): 50 mg via INTRAVENOUS
  Administered 2023-07-25: 100 mg via INTRAVENOUS

## 2023-07-25 MED ORDER — LACTATED RINGERS IV SOLN: INTRAVENOUS | Status: DC

## 2023-07-25 NOTE — Op Note (Addendum)
[facility] Patient Name: Laura [name] Procedure Date: 07/25/2023 1:39 PM MRN: [id number] Date of Birth: [birth year] Attending MD: Hennie Duos. Marletta Lor , [place], [id number] CSN: [id number] Age: 72 Admit Type: Outpatient Procedure:                Small bowel enteroscopy Indications:              Follow-up of adenomatous polyps in the small bowel Providers:                Hennie Duos. Marletta Lor, DO, Crystal Page, Zena Amos Referring MD:              Medicines:                See the Anesthesia note for documentation of the                            administered medications Complications:            No immediate complications. Estimated Blood Loss:     Estimated blood loss: none. Procedure:                Pre-Anesthesia Assessment:                           - The anesthesia plan was to use monitored                            anesthesia care (MAC).                           After obtaining informed consent, the endoscope was                            passed under direct vision. Throughout the                            procedure, the patient's blood pressure, pulse, and                            oxygen saturations were monitored continuously. The                            [id number]-HQ190L ([id number]) was introduced through the                            mouth and advanced to the fourth part of duodenum.                            The small bowel enteroscopy was accomplished                            without difficulty. The patient tolerated the                            procedure well. Scope In:  1:56:35 PM Scope Out: 2:03:46 PM Total Procedure Duration: 0 hours 7 minutes 11 seconds  Findings:      A single 15-20 mm sessile polyp (previously biopsied, dudoenal adenoma)       with no bleeding was found in the third portion of the duodenum. Upon       further inspection on today's exam, lesion is larger than intiially       expected. Difficult to manipulate scope  in optimal fashion for EMR.       Procedure then aborted. Impression:               - A single duodenal polyp.                           - No specimens collected. Moderate Sedation:      Per Anesthesia Care Recommendation:           - Patient has a contact number available for                            emergencies. The signs and symptoms of potential                            delayed complications were discussed with the                            patient. Return to normal activities tomorrow.                            Written discharge instructions were provided to the                            patient.                           - Resume previous diet.                           - Continue present medications.                           - Patient will need likely need EMR of her large                            duodenal adenoma with use of sideviewing scope. I                            will reach out to Dr. Meridee Score in this regard. Procedure Code(s):        --- Professional ---                           [id number], Small intestinal endoscopy, enteroscopy                            beyond second portion of duodenum, not including  ileum; diagnostic, including collection of                            specimen(s) by brushing or washing, when performed                            (separate procedure) Diagnosis Code(s):        --- Professional ---                           K31.7, Polyp of stomach and duodenum                           D13.30, Benign neoplasm of unspecified part of                            small intestine CPT copyright 2022 American Medical Association. All rights reserved. The codes documented in this report are preliminary and upon coder review may  be revised to meet current compliance requirements. Hennie Duos. Marletta Lor, DO Hennie Duos. Marletta Lor, DO 07/25/2023 2:09:48 PM This report has been signed electronically. Number of Addenda: 0

## 2023-07-25 NOTE — Anesthesia Preprocedure Evaluation (Signed)
Anesthesia Evaluation  Patient identified by MRN, date of birth, ID band Patient awake    Reviewed: Allergy & Precautions, H&P , NPO status , Patient's Chart, lab work & pertinent test results  Airway Mallampati: II  TM Distance: >3 FB Neck ROM: Full    Dental  (+) Missing, Caps, Dental Advisory Given   Pulmonary neg pulmonary ROS, former smoker   Pulmonary exam normal breath sounds clear to auscultation       Cardiovascular Exercise Tolerance: Good METS (Unsteady gait when walking): hypertension, Pt. on medications Normal cardiovascular exam Rhythm:Regular Rate:Normal     Neuro/Psych  Headaches PSYCHIATRIC DISORDERS Anxiety Depression     Neuromuscular disease    GI/Hepatic Neg liver ROS,GERD  Medicated and Controlled,,  Endo/Other  Hypothyroidism    Renal/GU negative Renal ROS  negative genitourinary   Musculoskeletal  (+) Arthritis , Osteoarthritis,    Abdominal   Peds negative pediatric ROS (+)  Hematology  (+) Blood dyscrasia, anemia   Anesthesia Other Findings   Reproductive/Obstetrics negative OB ROS                             Anesthesia Physical Anesthesia Plan  ASA: 2  Anesthesia Plan: General   Post-op Pain Management: Minimal or no pain anticipated   Induction: Intravenous  PONV Risk Score and Plan:   Airway Management Planned: Nasal Cannula and Natural Airway  Additional Equipment:   Intra-op Plan:   Post-operative Plan:   Informed Consent: I have reviewed the patients History and Physical, chart, labs and discussed the procedure including the risks, benefits and alternatives for the proposed anesthesia with the patient or authorized representative who has indicated his/her understanding and acceptance.     Dental advisory given  Plan Discussed with: CRNA and Surgeon  Anesthesia Plan Comments:        Anesthesia Quick Evaluation

## 2023-07-25 NOTE — Anesthesia Postprocedure Evaluation (Signed)
Anesthesia Post Note  Patient: Laura [name]  Procedure(s) Performed: ENTEROSCOPY  Patient location during evaluation: Short Stay Anesthesia Type: General Level of consciousness: awake and alert Pain management: pain level controlled Vital Signs Assessment: post-procedure vital signs reviewed and stable Respiratory status: spontaneous breathing Cardiovascular status: blood pressure returned to baseline Postop Assessment: no apparent nausea or vomiting Anesthetic complications: no   No notable events documented.   Last Vitals:  Vitals:   07/25/23 1236  BP: 137/67  Pulse: 69  Resp: 13  Temp: 36.8 C  SpO2: 100%    Last Pain:  Vitals:   07/25/23 1351  TempSrc:   PainSc: 0-No pain                 Rhia Blatchford

## 2023-07-25 NOTE — Interval H&P Note (Signed)
History and Physical Interval Note:  07/25/2023 1:34 PM  Laura [name]  has presented today for surgery, with the diagnosis of duodenal adenoma.  The various methods of treatment have been discussed with the patient and family. After consideration of risks, benefits and other options for treatment, the patient has consented to  Procedure(s) with comments: ENTEROSCOPY (N/A) - 1:45 pm, asa 2 as a surgical intervention.  The patient's history has been reviewed, patient examined, no change in status, stable for surgery.  I have reviewed the patient's chart and labs.  Questions were answered to the patient's satisfaction.     [name]

## 2023-07-25 NOTE — Discharge Instructions (Addendum)
Enteroscopy Discharge instructions Please read the instructions outlined below and refer to this sheet in the next few weeks. These discharge instructions provide you with general information on caring for yourself after you leave the hospital. Your doctor may also give you specific instructions. While your treatment has been planned according to the most current medical practices available, unavoidable complications occasionally occur. If you have any problems or questions after discharge, please call your doctor. ACTIVITY You may resume your regular activity but move at a slower pace for the next 24 hours.  Take frequent rest periods for the next 24 hours.  Walking will help expel (get rid of) the air and reduce the bloated feeling in your abdomen.  No driving for 24 hours (because of the anesthesia (medicine) used during the test).  You may shower.  Do not sign any important legal documents or operate any machinery for 24 hours (because of the anesthesia used during the test).  NUTRITION Drink plenty of fluids.  You may resume your normal diet.  Begin with a light meal and progress to your normal diet.  Avoid alcoholic beverages for 24 hours or as instructed by your caregiver.  MEDICATIONS You may resume your normal medications unless your caregiver tells you otherwise.  WHAT YOU CAN EXPECT TODAY You may experience abdominal discomfort such as a feeling of fullness or "gas" pains.  FOLLOW-UP Your doctor will discuss the results of your test with you.  SEEK IMMEDIATE MEDICAL ATTENTION IF ANY OF THE FOLLOWING OCCUR: Excessive nausea (feeling sick to your stomach) and/or vomiting.  Severe abdominal pain and distention (swelling).  Trouble swallowing.  Temperature over 101 F (37.8 C).  Rectal bleeding or vomiting of blood.    On today's evaluation, I was able to evaluate the duodenal adenoma in your small bowel.  I was able to visualize this much better with enteroscope versus previous  exam.  It is larger than I initially thought.  It is also in a very difficult position.  I think it is going to need more in-depth resection with a specialized side-viewing scope (we do not have this equipment in our facility).  I have reached out to advanced endoscopist in [place] about your case.  Once I have heard back, we will contact you.  I hope you have a great rest of your week!  Hennie Duos. Marletta Lor, D.O. Gastroenterology and Hepatology [facility]

## 2023-07-25 NOTE — Transfer of Care (Signed)
Immediate Anesthesia Transfer of Care Note  Patient: Laura [name]  Procedure(s) Performed: ENTEROSCOPY  Patient Location: Endoscopy Unit  Anesthesia Type:General  Level of Consciousness: awake  Airway & Oxygen Therapy: Patient Spontanous Breathing  Post-op Assessment: Report given to RN  Post vital signs: Reviewed and stable  Last Vitals:  Vitals Value Taken Time  BP    Temp    Pulse    Resp    SpO2      Last Pain:  Vitals:   07/25/23 1351  TempSrc:   PainSc: 0-No pain      Patients Stated Pain Goal: 4 (07/25/23 1236)  Complications: No notable events documented.

## 2023-08-02 ENCOUNTER — Encounter (HOSPITAL_COMMUNITY): Payer: Self-pay | Admitting: Internal Medicine

## 2023-08-10 ENCOUNTER — Telehealth: Payer: Self-pay

## 2023-08-10 DIAGNOSIS — D132 Benign neoplasm of duodenum: Secondary | ICD-10-CM

## 2023-08-10 NOTE — Addendum Note (Signed)
Addended by: Armstead Peaks on: 08/10/2023 10:47 AM   Modules accepted: Orders

## 2023-08-10 NOTE — Telephone Encounter (Signed)
Phoned the pt and LMOVM for the pt to return call.   Mindy: Pt needs a referral to see Dr Meridee Score for Duodenal Adenoma per Dr Marletta Lor

## 2023-08-10 NOTE — Telephone Encounter (Signed)
Referral placed.

## 2023-08-16 ENCOUNTER — Other Ambulatory Visit: Payer: Self-pay | Admitting: "Endocrinology

## 2023-08-25 ENCOUNTER — Encounter: Payer: Self-pay | Admitting: Family Medicine

## 2023-08-25 ENCOUNTER — Ambulatory Visit (INDEPENDENT_AMBULATORY_CARE_PROVIDER_SITE_OTHER): Payer: Medicare Other | Admitting: Family Medicine

## 2023-08-25 VITALS — BP 110/72 | HR 76 | Ht 63.0 in | Wt 174.0 lb

## 2023-08-25 DIAGNOSIS — K219 Gastro-esophageal reflux disease without esophagitis: Secondary | ICD-10-CM

## 2023-08-25 DIAGNOSIS — E782 Mixed hyperlipidemia: Secondary | ICD-10-CM

## 2023-08-25 DIAGNOSIS — F324 Major depressive disorder, single episode, in partial remission: Secondary | ICD-10-CM | POA: Diagnosis not present

## 2023-08-25 DIAGNOSIS — D132 Benign neoplasm of duodenum: Secondary | ICD-10-CM | POA: Diagnosis not present

## 2023-08-25 DIAGNOSIS — M5410 Radiculopathy, site unspecified: Secondary | ICD-10-CM

## 2023-08-25 DIAGNOSIS — I10 Essential (primary) hypertension: Secondary | ICD-10-CM | POA: Diagnosis not present

## 2023-08-25 NOTE — Assessment & Plan Note (Signed)
Referred for further evaluation, awaiting appointment  info

## 2023-08-25 NOTE — Assessment & Plan Note (Signed)
Controlled, no change in medication  

## 2023-08-25 NOTE — Progress Notes (Signed)
Laura [name]     MRN: [id number]      DOB: 12-22-1950  Chief Complaint  Patient presents with   Follow-up    Follow up questions about polyp sciatic nerve pain     HPI [name] is here for follow up and re-evaluation of chronic medical conditions, medication management and review of any available recent lab and radiology data.  Preventive health is updated, specifically  Cancer screening and Immunization.   QUESTIONS AS TO WHAT IUS GOING ON WITH HER Gi , THIS IS REVIEWED AND QUESTIONS ANSWERED TO HER UNDERSTANDING C/O INCREASED SCIATIC PAIN, UNDERSTANDS NO ANTI INFLAmmatories as she needs upper GI procedure,. The PT denies any adverse reactions to current medications since the last visit.  ROS Denies recent fever or chills. Denies sinus pressure, nasal congestion, ear pain or sore throat. Denies chest congestion, productive cough or wheezing. Denies chest pains, palpitations and leg swelling  Denies headaches, seizures, Denies depression, anxiety or insomnia. Denies skin break down or rash.   PE  BP 110/72 (BP Location: Right Arm, Patient Position: Sitting, Cuff Size: Large)   Pulse 76   Ht 5\' 3"  (1.6 m)   Wt 174 lb 0.6 oz (78.9 kg)   SpO2 96%   BMI 30.83 kg/m   Patient alert and oriented and in no cardiopulmonary distress.  HEENT: No facial asymmetry, EOMI,     Neck supple .  Chest: Clear to auscultation bilaterally.  CVS: S1, S2 no murmurs, no S3.Regular rate.  ABD: Soft non tender.   Ext: No edema   Skin: Intact, no ulcerations or rash noted.  Psych: Good eye contact, normal affect. Memory intact not anxious or depressed appearing.  CNS: CN 2-12 intact, power,  normal throughout.no focal deficits noted.   Assessment & Plan  Back pain with radiculopathy Increased pain in last 2 to 3 weeks, gabapentin dose increased to 300 mg at bedtime  Essential hypertension Controlled, no change in medication DASH diet and commitment to daily physical activity  for a minimum of 30 minutes discussed and encouraged, as a part of hypertension management. The importance of attaining a healthy weight is also discussed.     08/25/2023    9:36 AM 07/25/2023    2:09 PM 07/25/2023   12:36 PM 07/04/2023    9:08 AM 06/21/2023    3:11 PM 06/15/2023    8:57 AM 05/15/2023    4:17 PM  BP/Weight  Systolic BP 110 102 137 126 128 113 126  Diastolic BP 72 57 67 74 72 69 74  Wt. (Lbs) 174.04   170.8 168 168.08 168  BMI 30.83 kg/m2   30.26 kg/m2 29.76 kg/m2 29.77 kg/m2 29.76 kg/m2       Depression, major, single episode, in partial remission (HCC) Controlled, no change in medication   Duodenal adenoma Referred for further evaluation, awaiting appointment  info  Mixed hyperlipidemia Hyperlipidemia:Low fat diet discussed and encouraged.   Lipid Panel  Lab Results  Component Value Date   CHOL 204 (H) 04/03/2023   HDL 63 04/03/2023   LDLCALC 121 (H) 04/03/2023   TRIG 113 04/03/2023   CHOLHDL 3.2 04/03/2023     Updated lab needed at/ before next visit.   Gastroesophageal reflux disease Controlled, no change in medication

## 2023-08-25 NOTE — Assessment & Plan Note (Signed)
Controlled, no change in medication DASH diet and commitment to daily physical activity for a minimum of 30 minutes discussed and encouraged, as a part of hypertension management. The importance of attaining a healthy weight is also discussed.     08/25/2023    9:36 AM 07/25/2023    2:09 PM 07/25/2023   12:36 PM 07/04/2023    9:08 AM 06/21/2023    3:11 PM 06/15/2023    8:57 AM 05/15/2023    4:17 PM  BP/Weight  Systolic BP 110 102 137 126 128 113 126  Diastolic BP 72 57 67 74 72 69 74  Wt. (Lbs) 174.04   170.8 168 168.08 168  BMI 30.83 kg/m2   30.26 kg/m2 29.76 kg/m2 29.77 kg/m2 29.76 kg/m2

## 2023-08-25 NOTE — Patient Instructions (Addendum)
Annual exam as before, call if you need me sooner  Pls schedule wellness visit at checkout  Increase dose gabapentin to 3 capsules at bedtime pls, due to uncontrolled pain  You should get f/u from GI re arrangements to see Specialist in  [place] as we  discussed  Nurse pls document flu and covid vaccines, she has already had them  Fasting lipid, cmp and eGFr and Vit D 3 to 5 days before Jan appt  It is important that you exercise regularly at least 30 minutes 5 times a week. If you develop chest pain, have severe difficulty breathing, or feel very tired, stop exercising immediately and seek medical attention   Thanks for choosing [facility], we consider it a privelige to serve you.

## 2023-08-25 NOTE — Assessment & Plan Note (Signed)
Increased pain in last 2 to 3 weeks, gabapentin dose increased to 300 mg at bedtime

## 2023-08-25 NOTE — Assessment & Plan Note (Signed)
Hyperlipidemia:Low fat diet discussed and encouraged.   Lipid Panel  Lab Results  Component Value Date   CHOL 204 (H) 04/03/2023   HDL 63 04/03/2023   LDLCALC 121 (H) 04/03/2023   TRIG 113 04/03/2023   CHOLHDL 3.2 04/03/2023     Updated lab needed at/ before next visit.

## 2023-08-31 ENCOUNTER — Telehealth: Payer: Self-pay

## 2023-08-31 NOTE — Telephone Encounter (Signed)
Laura [name] came by the office this am wanting to know the next step in her treatment because Dr Lodema Hong will not let her get her knee injections until they know what you are going to do. Letter on your desk. Please advise

## 2023-09-01 NOTE — Telephone Encounter (Signed)
Her next step is to meet with Dr. Meridee Score to discuss resection of the polyp in her small bowel. The referral was made on 08/10/23

## 2023-09-02 ENCOUNTER — Other Ambulatory Visit: Payer: Self-pay | Admitting: "Endocrinology

## 2023-09-02 DIAGNOSIS — E890 Postprocedural hypothyroidism: Secondary | ICD-10-CM

## 2023-09-04 ENCOUNTER — Telehealth: Payer: Self-pay | Admitting: Family Medicine

## 2023-09-04 NOTE — Telephone Encounter (Signed)
Prescription Request  09/04/2023  LOV: 08/25/2023  What is the name of the medication or equipment? gabapentin (NEURONTIN) 100 MG capsule [[id number]] hydrochlorothiazide (HYDRODIURIL) 25 MG tablet [[id number]] levocetirizine (XYZAL) 5 MG tablet [[id number]] omeprazole (PRILOSEC) 20 MG capsule [[id number]]  [OUT OF MED]   Have you contacted your pharmacy to request a refill? No   Which pharmacy would you like this sent to?   [facility] - [place], [place] - [address] [place] [address] Phone: [phone] Fax: [phone]    Patient notified that their request is being sent to the clinical staff for review and that they should receive a response within 2 business days.   Please advise at [facility] [phone]

## 2023-09-05 ENCOUNTER — Other Ambulatory Visit: Payer: Self-pay

## 2023-09-05 MED ORDER — OMEPRAZOLE 20 MG PO CPDR: DELAYED_RELEASE_CAPSULE | ORAL | 2 refills | Status: DC

## 2023-09-05 MED ORDER — LEVOCETIRIZINE DIHYDROCHLORIDE 5 MG PO TABS: 5.0000 mg | ORAL_TABLET | Freq: Every evening | ORAL | 3 refills | Status: DC

## 2023-09-05 MED ORDER — HYDROCHLOROTHIAZIDE 25 MG PO TABS: 25.0000 mg | ORAL_TABLET | Freq: Every day | ORAL | 0 refills | Status: DC

## 2023-09-05 MED ORDER — GABAPENTIN 100 MG PO CAPS: ORAL_CAPSULE | ORAL | 1 refills | Status: DC

## 2023-09-05 NOTE — Telephone Encounter (Signed)
Refilled

## 2023-09-07 ENCOUNTER — Other Ambulatory Visit: Payer: Self-pay

## 2023-09-07 MED ORDER — OMEPRAZOLE 20 MG PO CPDR: DELAYED_RELEASE_CAPSULE | ORAL | 2 refills | Status: DC

## 2023-09-15 ENCOUNTER — Other Ambulatory Visit: Payer: Self-pay | Admitting: Family Medicine

## 2023-09-18 ENCOUNTER — Other Ambulatory Visit: Payer: Self-pay

## 2023-09-18 ENCOUNTER — Telehealth: Payer: Self-pay

## 2023-09-18 DIAGNOSIS — E782 Mixed hyperlipidemia: Secondary | ICD-10-CM | POA: Diagnosis not present

## 2023-09-18 DIAGNOSIS — D132 Benign neoplasm of duodenum: Secondary | ICD-10-CM

## 2023-09-18 DIAGNOSIS — E890 Postprocedural hypothyroidism: Secondary | ICD-10-CM | POA: Diagnosis not present

## 2023-09-18 NOTE — Telephone Encounter (Signed)
Enteroscopy has been set up for 12/14/23 at 1215 pm at [facility] with GM   Left message on machine to call back

## 2023-09-18 NOTE — Telephone Encounter (Signed)
-----   Message from [facility] sent at 09/18/2023  9:26 AM EST ----- Regarding: RE: Followup Procedure Scheduling Aren Cherne, Please look at Referral. It looks like we may/may not have heard back from Dr. Marletta Lor on last reply. In any case, looks like I put on my list that we had agreed to move forward. So, offer this patient Enteroscopy with EMR (75 minute) in February (can be seen in clinic with me before then). If this doesn't work or she wants to discuss things further with her primary GI, just reply back on referral to both [facility] and myself. Thanks. ----- Message ----- From: Loretha Stapler, RN Sent: 09/18/2023   9:00 AM EST To: Lemar Lofty., MD Subject: RE: Followup Procedure Scheduling              I do not have any information on her. I do not think I was sent anything. ----- Message ----- From: Lemar Lofty., MD Sent: 09/15/2023   5:44 AM EST To: Loretha Stapler, RN; [name], DO Subject: Followup Procedure Scheduling                  Nazario Russom, I am looking at my lists and see that this patient was on here from about 1 week ago. I cannot find my message however. I think, I may have said that due to scheduling, this would go into next year for a procedure, but want to see if you have this, because I went into her chart and do not see that we have scheduled. Do you have communication from Dr. Marletta Lor or myself? Thanks as always. GM

## 2023-09-18 NOTE — Telephone Encounter (Signed)
Enteroscopy and ROV scheduled, pt instructed and medications reviewed.  Patient instructions mailed to home.  Patient to call with any questions or concerns.

## 2023-09-18 NOTE — Telephone Encounter (Signed)
Follow up appt made for 11/28/23 at 1050 am with Gm

## 2023-09-19 LAB — LIPID PANEL
Chol/HDL Ratio: 3.1 {ratio} (ref 0.0–4.4)
Cholesterol, Total: 172 mg/dL (ref 100–199)
HDL: 56 mg/dL (ref >39)
LDL Chol Calc (NIH): 99 mg/dL (ref 0–99)
Triglycerides: 95 mg/dL (ref 0–149)
VLDL Cholesterol Cal: 17 mg/dL (ref 5–40)

## 2023-09-19 LAB — T4, FREE: Free T4: 1.24 ng/dL (ref 0.82–1.77)

## 2023-09-19 LAB — TSH: TSH: 0.898 u[IU]/mL (ref 0.450–4.500)

## 2023-09-20 ENCOUNTER — Encounter: Payer: Self-pay | Admitting: Family Medicine

## 2023-09-22 ENCOUNTER — Encounter: Payer: Self-pay | Admitting: Orthopedic Surgery

## 2023-09-22 ENCOUNTER — Ambulatory Visit (INDEPENDENT_AMBULATORY_CARE_PROVIDER_SITE_OTHER): Payer: Medicare Other | Admitting: Orthopedic Surgery

## 2023-09-22 VITALS — BP 127/74 | HR 71 | Ht 63.0 in | Wt 176.0 lb

## 2023-09-22 DIAGNOSIS — M545 Low back pain, unspecified: Secondary | ICD-10-CM | POA: Diagnosis not present

## 2023-09-22 DIAGNOSIS — M1711 Unilateral primary osteoarthritis, right knee: Secondary | ICD-10-CM | POA: Diagnosis not present

## 2023-09-22 MED ORDER — METHOCARBAMOL 500 MG PO TABS: 500.0000 mg | ORAL_TABLET | Freq: Three times a day (TID) | ORAL | 0 refills | Status: DC

## 2023-09-22 MED ORDER — TRAMADOL HCL 50 MG PO TABS
50.0000 mg | ORAL_TABLET | Freq: Four times a day (QID) | ORAL | 0 refills | Status: DC | PRN
Start: 2023-09-22 — End: 2023-10-13

## 2023-09-22 NOTE — Progress Notes (Signed)
Return patient Visit  Assessment: Laura [name] is a 72 y.o. female with the following: 1. Arthritis of right knee 2.  Low back spasm  Plan: Laura [name] has advanced degenerative changes in the right knee.  She has had a recent pain in the right knee.  She would like to repeat a steroid injection.  This was completed in clinic today.  In addition, she has acute onset of pain in the right side of her lower back.  Description of the pain is most consistent with back spasm.  I provided her with some tramadol for aches and pains, as well as some Robaxin.  Procedure note injection Right knee joint   Verbal consent was obtained to inject the right knee joint  Timeout was completed to confirm the site of injection.  The skin was prepped with alcohol and ethyl chloride was sprayed at the injection site.  A 21-gauge needle was used to inject 40 mg of Depo-Medrol and 1% lidocaine (4 cc) into the right knee using an anterolateral approach.  There were no complications. A sterile bandage was applied.   Follow-up: Return if symptoms worsen or fail to improve.  Subjective:  Chief Complaint  Patient presents with   Injections    R knee would like a repeat injection.     History of Present Illness: Laura [name] is a 72 y.o. female who returns to clinic for repeat evaluation of right knee pain.  She has advanced degenerative changes of the right knee.  Right knee is previously been injected.  This provided relief of her symptoms.  Pain is returned.  She is interested in another injection.  Over the past couple weeks, she is also been having low back pain.  She describes the pain as a catching sensation.  She has some spasm.  She asks specifically about oxycodone.  She states the tramadol helps but not much.  She is interested in a refill of tramadol.  Review of Systems: No fevers or chills No numbness or tingling No chest pain No shortness of breath No bowel or bladder dysfunction No GI  distress No headaches    Objective: BP 127/74   Pulse 71   Ht 5\' 3"  (1.6 m)   Wt 176 lb (79.8 kg)   BMI 31.18 kg/m   Physical Exam:  General: Alert and oriented. and No acute distress. Gait: Right sided antalgic gait.  Evaluation of the right knee demonstrates a mild effusion.  No obvious malalignment.  She has tenderness palpation in the lateral joint line.  Exquisite tenderness palpation over the medial joint line.  She can get to full extension.  She tolerates flexion beyond 90 degrees.  Tenderness to palpation of the lower back.  Sensation is intact distally.  IMAGING: No new imaging obtained today   New Medications:  Meds ordered this encounter  Medications   methocarbamol (ROBAXIN) 500 MG tablet    Sig: Take 1 tablet (500 mg total) by mouth 3 (three) times daily.    Dispense:  30 tablet    Refill:  0   traMADol (ULTRAM) 50 MG tablet    Sig: Take 1 tablet (50 mg total) by mouth every 6 (six) hours as needed.    Dispense:  30 tablet    Refill:  0      Oliver Barre, MD  09/22/2023 10:03 AM

## 2023-09-22 NOTE — Patient Instructions (Signed)

## 2023-09-27 ENCOUNTER — Ambulatory Visit: Payer: Medicare Other

## 2023-09-27 VITALS — BP 128/64 | Ht 63.0 in | Wt 177.0 lb

## 2023-09-27 DIAGNOSIS — Z780 Asymptomatic menopausal state: Secondary | ICD-10-CM | POA: Diagnosis not present

## 2023-09-27 DIAGNOSIS — Z0000 Encounter for general adult medical examination without abnormal findings: Secondary | ICD-10-CM

## 2023-09-27 NOTE — Progress Notes (Signed)
Subjective:   Laura [name] is a 72 y.o. female who presents for Medicare Annual (Subsequent) preventive examination.  Visit Complete: In person  Patient Medicare AWV questionnaire was completed by the patient on 09/27/2023; I have confirmed that all information answered by patient is correct and no changes since this date.  Cardiac Risk Factors include: dyslipidemia;advanced age (>5men, >66 women);hypertension;smoking/ tobacco exposure     Objective:    Today's Vitals   09/27/23 0949 09/27/23 0950  BP: 128/64   Weight: 177 lb (80.3 kg)   Height: 5\' 3"  (1.6 m)   PainSc:  6    Body mass index is 31.35 kg/m.     07/25/2023   12:29 PM 04/20/2023    6:38 AM 04/18/2023    2:19 PM 09/05/2022    3:55 PM 12/07/2021   10:04 AM 08/30/2021    8:56 AM 07/31/2021    7:34 AM  Advanced Directives  Does Patient Have a Medical Advance Directive? No No No Yes No No No  Does patient want to make changes to medical advance directive?    Yes (MAU/Ambulatory/Procedural Areas - Information given)     Would patient like information on creating a medical advance directive? No - Patient declined No - Patient declined No - Patient declined  Yes (MAU/Ambulatory/Procedural Areas - Information given) Yes (MAU/Ambulatory/Procedural Areas - Information given)     Current Medications (verified) Outpatient Encounter Medications as of 09/27/2023  Medication Sig   acetaminophen (TYLENOL) 500 MG tablet Take 1 tablet (500 mg total) by mouth every 6 (six) hours as needed. (Patient taking differently: Take 1,500 mg by mouth daily as needed for mild pain (pain score 1-3) or headache.)   amLODipine (NORVASC) 5 MG tablet TAKE 1 TABLET BY MOUTH DAILY   atorvastatin (LIPITOR) 80 MG tablet TAKE 1 TABLET BY MOUTH ONCE  DAILY   buPROPion (WELLBUTRIN XL) 150 MG 24 hr tablet Take one tablet by mouth once daily   Calcium Carbonate-Vit D-Min (CALCIUM 1200) 1200-1000 MG-UNIT CHEW Take one tablet by mouth two times daily    citalopram (CELEXA) 40 MG tablet TAKE 1 TABLET BY MOUTH DAILY   cyclobenzaprine (FLEXERIL) 10 MG tablet Take 1 tablet (10 mg total) by mouth 2 (two) times daily as needed.   fenofibrate (TRICOR) 48 MG tablet TAKE 1 TABLET BY MOUTH DAILY   fluticasone (FLONASE) 50 MCG/ACT nasal spray Place 1 spray into both nostrils daily as needed for allergies or rhinitis.   furosemide (LASIX) 20 MG tablet TAKE 1 TABLET(20 MG) BY MOUTH DAILY   gabapentin (NEURONTIN) 100 MG capsule Take three  capsules at bedtime for neuropathic pain   hydrochlorothiazide (HYDRODIURIL) 25 MG tablet Take 1 tablet (25 mg total) by mouth daily.   levocetirizine (XYZAL) 5 MG tablet Take 1 tablet (5 mg total) by mouth every evening.   levothyroxine (SYNTHROID) 88 MCG tablet TAKE 1 TABLET BY MOUTH DAILY  BEFORE BREAKFAST   methocarbamol (ROBAXIN) 500 MG tablet Take 1 tablet (500 mg total) by mouth 3 (three) times daily.   montelukast (SINGULAIR) 10 MG tablet Take 1 tablet (10 mg total) by mouth at bedtime.   Multiple Vitamin (MULTIVITAMIN WITH MINERALS) TABS tablet Take 1 tablet by mouth daily.   omeprazole (PRILOSEC) 20 MG capsule TAKE 1 CAPSULE BY MOUTH TWICE  DAILY BEFORE A MEAL   Polyethyl Glycol-Propyl Glycol (SYSTANE OP) Place 1 drop into both eyes daily as needed (dry eyes).   potassium chloride SA (KLOR-CON M) 20 MEQ tablet Take two  tablets three times daily for 5 days , then take two tablets twice daily (Patient taking differently: Take 40 mEq by mouth 2 (two) times daily. Take two tablets three times daily for 5 days , then take two tablets twice daily)   rosuvastatin (CRESTOR) 40 MG tablet TAKE 1 TABLET(40 MG) BY MOUTH DAILY   traMADol (ULTRAM) 50 MG tablet Take 1 tablet (50 mg total) by mouth every 6 (six) hours as needed.   Facility-Administered Encounter Medications as of 09/27/2023  Medication   lidocaine (PF) (XYLOCAINE) 1 % injection 4 mL    Allergies (verified) Cozaar [losartan potassium], Dilaudid [hydromorphone  hcl], Levaquin [levofloxacin], Nsaids, and Promethazine   History: Past Medical History:  Diagnosis Date   Anxiety    At low risk for fall 08/15/2018   Cystitis    Depression    Gastritis    GERD (gastroesophageal reflux disease)    Hashimoto's thyroiditis    Hx    Hyperlipidemia    Hypertension    Hypothyroidism    Normocytic anemia 2009 Hb 9.9-11.1   Osteopenia    Shoulder pain    Urinary incontinence    Past Surgical History:  Procedure Laterality Date   Bilateral tubal ligation     BIOPSY  04/21/2020   Procedure: BIOPSY;  Surgeon: Corbin Ade, MD;  Location: [facility];  Service: Endoscopy;;   BIOPSY  04/20/2023   Procedure: BIOPSY;  Surgeon: [name], DO;  Location: [facility];  Service: Endoscopy;;   BREAST EXCISIONAL BIOPSY  2010   Excisional biopsy of benign left breast mass -lopoma    BREAST SURGERY     left nreast biopsy   COLONOSCOPY  08/26/2011   SLF: 1. Internal hemorrhoids   COLONOSCOPY N/A 10/06/2015   WUJ:WJXB fissure or internal hemorrhoids/mild sigmoid colitis. benign colonic path    COLONOSCOPY WITH PROPOFOL N/A 03/15/2021   Procedure: COLONOSCOPY WITH PROPOFOL;  Surgeon: [name], DO;  [facility]: [facility];  Service: Endoscopy;  Laterality: N/A;  3:00pm   colonscopy  2005   Dr. Katrinka Blazing   ENTEROSCOPY N/A 07/25/2023   Procedure: ENTEROSCOPY;  Surgeon: [name], DO;  [facility]: [facility];  Service: Endoscopy;  Laterality: N/A;  1:45 pm, asa 2   ESOPHAGOGASTRODUODENOSCOPY  12/2009   chronic gastritis   ESOPHAGOGASTRODUODENOSCOPY N/A 05/17/2017   Dr. Darrick Penna; gastritis, normal small bowel biopsies, multiple gastric polyps. fundic gland polyps   ESOPHAGOGASTRODUODENOSCOPY (EGD) WITH PROPOFOL N/A 04/21/2020   Rourk: multiple gastric polyps, biopsy of one c/w fundic gland. gastric biopsy showed mild chronic gastritis but no H.pylori   ESOPHAGOGASTRODUODENOSCOPY (EGD) WITH PROPOFOL N/A 04/20/2023   Procedure:  ESOPHAGOGASTRODUODENOSCOPY (EGD) WITH PROPOFOL;  Surgeon: [name], DO;  [facility]: [facility];  Service: Endoscopy;  Laterality: N/A;  1:00 pm, asa 3, pt knows to arrive at 6:30   GIVENS CAPSULE STUDY N/A 05/30/2017   gastritis, no source for anemia identified   left knee surgery Left March 1,2017   PARTIAL KNEE ARTHROPLASTY Left 08/09/2017   Procedure: LEFT UNICOMPARTMENTAL KNEE;  Surgeon: Ollen Gross, MD;  Location: [facility];  Service: Orthopedics;  Laterality: Left;   POLYPECTOMY  04/21/2020   Procedure: POLYPECTOMY;  Surgeon: Corbin Ade, MD;  Location: [facility];  Service: Endoscopy;;  gastric   Resection of left lobe of thyroid     right carpal tunnel release     rt. knee athroscopy  2004   SAVORY DILATION N/A 05/17/2017   Procedure:  SAVORY DILATION;  Surgeon: West Bali, MD;  Location: [facility];  Service: Endoscopy;  Laterality: N/A;   TOTAL ABDOMINAL HYSTERECTOMY  1994   UMBILICAL HERNIA REPAIR     Urethral dilation for stenosis  2009   Family History  Problem Relation Age of Onset   Pneumonia Mother    Healthy Father    Colon cancer Neg Hx    Anesthesia problems Neg Hx    Hypotension Neg Hx    Malignant hyperthermia Neg Hx    Pseudochol deficiency Neg Hx    Social History   Socioeconomic History   Marital status: Married    Spouse name: Not on file   Number of children: 3   Years of education: Not on file   Highest education level: Not on file  Occupational History   Occupation: Designer, 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   Tobacco Use   Smoking status: Former    Current packs/day: 0.00    Average packs/day: 0.3 packs/day for 1 year (0.3 ttl pk-yrs)    Types: Cigarettes    Start date: 08/26/1971    Quit date: 08/25/1972    Years since quitting: 51.1    Passive exposure: Past   Smokeless tobacco: Never  Vaping Use   Vaping status: Never Used  Substance and Sexual Activity   Alcohol use: No   Drug use: No   Sexual activity: Not  Currently    Birth control/protection: Surgical    Comment: hyst  Other Topics Concern   Not on file  Social History Narrative   Not on file   Social Determinants of Health   Financial Resource Strain: Low Risk  (09/27/2023)   Overall Financial Resource Strain (CARDIA)    Difficulty of Paying Living Expenses: Not hard at all  Food Insecurity: No Food Insecurity (09/27/2023)   Hunger Vital Sign    Worried About Running Out of Food in the Last Year: Never true    Ran Out of Food in the Last Year: Never true  Transportation Needs: No Transportation Needs (09/27/2023)   PRAPARE - Administrator, Civil Service (Medical): No    Lack of Transportation (Non-Medical): No  Physical Activity: Inactive (09/27/2023)   Exercise Vital Sign    Days of Exercise per Week: 0 days    Minutes of Exercise per Session: 0 min  Stress: Stress Concern Present (09/27/2023)   [employer] of Occupational Health - Occupational Stress Questionnaire    Feeling of Stress : To some extent  Social Connections: Moderately Integrated (09/27/2023)   Social Connection and Isolation Panel [NHANES]    Frequency of Communication with Friends and Family: Three times a week    Frequency of Social Gatherings with Friends and Family: Three times a week    Attends Religious Services: More than 4 times per year    Active Member of Clubs or Organizations: No    Attends Banker Meetings: Never    Marital Status: Married    Tobacco Counseling Counseling given: Not Answered   Clinical Intake:  Pre-visit preparation completed: Yes  Pain : 0-10 Pain Score: 6  Pain Location: Back Pain Orientation: Right Pain Radiating Towards: right leg     Nutritional Status: BMI 25 -29 Overweight Diabetes: No  How often do you need to have someone help you when you read instructions, pamphlets, or other written materials from your doctor or pharmacy?: 1 - Never  Interpreter Needed?: No       Activities of  Daily Living    09/27/2023    9:58 AM 04/18/2023    2:21 PM  In your present state of health, do you have any difficulty performing the following activities:  Hearing? 0   Vision? 0   Difficulty concentrating or making decisions? 0   Walking or climbing stairs? 0   Dressing or bathing? 0   Doing errands, shopping? 0 0  Preparing Food and eating ? N   Using the Toilet? N   In the past six months, have you accidently leaked urine? N   Do you have problems with loss of bowel control? N   Managing your Medications? N   Managing your Finances? N   Housekeeping or managing your Housekeeping? N     Patient Care Team: Kerri Perches, MD as PCP - General Fransico Him Denman George, MD as Consulting Physician (Endocrinology) [name], DO as Consulting Physician (Internal Medicine)  Indicate any recent Medical Services you may have received from other than [facility] providers in the past year (date may be approximate).     Assessment:   This is a routine wellness examination for [place].  Hearing/Vision screen No results found.   Goals Addressed             This Visit's Progress    Exercise 3x per week (30 min per time)   Not on track    Recommend starting a routine exercise program at least 3 days a week for 30-45 minutes at a time as tolerated.       Patient Stated   On track    Stay away from covid.       Depression Screen    09/27/2023    9:56 AM 08/25/2023    9:37 AM 06/15/2023    8:58 AM 05/09/2023    9:30 AM 05/08/2023    8:14 AM 04/24/2023    9:16 AM 03/24/2023    9:09 AM  PHQ 2/9 Scores  PHQ - 2 Score 0 0 0 0 0 0 0  PHQ- 9 Score   1 1 1  1     Fall Risk    08/25/2023    9:37 AM 06/15/2023    8:58 AM 05/09/2023    9:29 AM 05/08/2023    8:14 AM 04/24/2023    9:16 AM  Fall Risk   Falls in the past year? 0 0 0 0 0  Number falls in past yr: 0 0 0 0 0  Injury with Fall? 0 0 0 0 0  Risk for fall due to : No Fall Risks No Fall Risks No Fall  Risks No Fall Risks   Follow up Falls evaluation completed Falls evaluation completed Falls evaluation completed Falls evaluation completed     MEDICARE RISK AT HOME: Medicare Risk at Home Any stairs in or around the home?: No If so, are there any without handrails?: No Home free of loose throw rugs in walkways, pet beds, electrical cords, etc?: No Adequate lighting in your home to reduce risk of falls?: No Life alert?: No Use of a cane, walker or w/c?: No Grab bars in the bathroom?: No Shower chair or bench in shower?: Yes Elevated toilet seat or a handicapped toilet?: Yes  TIMED UP AND GO:  Was the test performed?  Yes  Length of time to ambulate 10 feet: 6 sec Gait steady and fast without use of assistive device    Cognitive Function:    08/30/2021    8:57 AM  MMSE -  Mini Mental State Exam  Not completed: Unable to complete        09/27/2023    9:59 AM 09/05/2022    4:01 PM 08/30/2021    8:58 AM 08/07/2020   10:35 AM 08/07/2019    1:29 PM  6CIT Screen  What Year? 0 points 0 points 0 points 0 points 0 points  What month? 0 points 0 points 0 points 0 points 0 points  What time? 0 points 0 points 0 points 0 points 0 points  Count back from 20 0 points 0 points 0 points 0 points 0 points  Months in reverse 0 points 0 points 0 points 0 points 0 points  Repeat phrase 0 points 2 points 0 points 0 points 0 points  Total Score 0 points 2 points 0 points 0 points 0 points    Immunizations Immunization History  Administered Date(s) Administered   Fluad Quad(high Dose 65+) 06/20/2019, 08/05/2020, 06/30/2021, 07/13/2022, 07/18/2023   Influenza Split 07/17/2012   Influenza Whole 10/08/2003, 07/01/2010, 07/14/2011   Influenza,inj,Quad PF,6+ Mos 08/14/2013, 08/28/2014, 09/03/2015, 08/10/2016, 06/21/2017, 06/26/2018   Influenza-Unspecified 09/14/2017   Moderna Covid-19 Vaccine Bivalent Booster 15yrs & up 04/01/2020, 07/28/2022, 07/18/2023   Moderna SARS-COV2 Booster Vaccination  04/05/2021, 08/17/2021   Moderna Sars-Covid-2 Vaccination 12/15/2019, 01/12/2020, 08/14/2020   Pneumococcal Conjugate-13 06/05/2014   Pneumococcal Polysaccharide-23 11/20/2015   Td 04/21/2004   Tdap 06/05/2014   Zoster Recombinant(Shingrix) 04/22/2021, 01/05/2022   Zoster, Live 03/31/2011    TDAP status: Up to date  Flu Vaccine status: Up to date  Pneumococcal vaccine status: Up to date  Covid-19 vaccine status: Completed vaccines  Qualifies for Shingles Vaccine? Yes   Zostavax completed Yes   Shingrix Completed?: Yes  Screening Tests Health Maintenance  Topic Date Due   Medicare Annual Wellness (AWV)  09/06/2023   COVID-19 Vaccine (9 - 2023-24 season) 09/12/2023   DTaP/Tdap/Td (3 - Td or Tdap) 06/05/2024   MAMMOGRAM  10/25/2024   Colonoscopy  03/16/2031   Pneumonia Vaccine 30+ Years old  Completed   INFLUENZA VACCINE  Completed   DEXA SCAN  Completed   Hepatitis C Screening  Completed   Zoster Vaccines- Shingrix  Completed   HPV VACCINES  Aged Out    Health Maintenance  Health Maintenance Due  Topic Date Due   Medicare Annual Wellness (AWV)  09/06/2023   COVID-19 Vaccine (9 - 2023-24 season) 09/12/2023    Colorectal cancer screening: Type of screening: Colonoscopy. Completed 2022. Repeat every 10 years  Mammogram status: Completed yes. Repeat every year  Bone Density status: Ordered today. Pt provided with contact info and advised to call to schedule appt.  Lung Cancer Screening: (Low Dose CT Chest recommended if Age 7-80 years, 20 pack-year currently smoking OR have quit w/in 15years.) does not qualify.   Lung Cancer Screening Referral: na  Additional Screening:  Hepatitis C Screening: does not qualify; Completed   Vision Screening: Recommended annual ophthalmology exams for early detection of glaucoma and other disorders of the eye. Is the patient up to date with their annual eye exam?  Yes  Who is the provider or what is the name of the office in  which the patient attends annual eye exams? [email or web address] If pt is not established with a provider, would they like to be referred to a provider to establish care? No .   Dental Screening: Recommended annual dental exams for proper oral hygiene   Community Resource Referral / Chronic Care Management: CRR required this visit?  No  CCM required this visit?  No     Plan:     I have personally reviewed and noted the following in the patient's chart:   Medical and social history Use of alcohol, tobacco or illicit drugs  Current medications and supplements including opioid prescriptions. Patient is not currently taking opioid prescriptions. Functional ability and status Nutritional status Physical activity Advanced directives List of other physicians Hospitalizations, surgeries, and ER visits in previous 12 months Vitals Screenings to include cognitive, depression, and falls Referrals and appointments  In addition, I have reviewed and discussed with patient certain preventive protocols, quality metrics, and best practice recommendations. A written personalized care plan for preventive services as well as general preventive health recommendations were provided to patient.     Abner Greenspan, LPN   [id number]   After Visit Summary: (In Person-Printed) AVS printed and given to the patient  Nurse Notes:

## 2023-09-27 NOTE — Patient Instructions (Addendum)
  Ms. Pascasio , Thank you for taking time to come for your Medicare Wellness Visit. I appreciate your ongoing commitment to your health goals. Please review the following plan we discussed and let me know if I can assist you in the future.   Call [name] at [phone] to schedule your bone density.  These are the goals we discussed:  Goals      Exercise 3x per week (30 min per time)     Recommend starting a routine exercise program at least 3 days a week for 30-45 minutes at a time as tolerated.       Patient Stated     Stay away from covid.        This is a list of the screening recommended for you and due dates:  Health Maintenance  Topic Date Due   COVID-19 Vaccine (9 - 2023-24 season) 09/12/2023   DTaP/Tdap/Td vaccine (3 - Td or Tdap) 06/05/2024   Medicare Annual Wellness Visit  09/26/2024   Mammogram  10/25/2024   Colon Cancer Screening  03/16/2031   Pneumonia Vaccine  Completed   Flu Shot  Completed   DEXA scan (bone density measurement)  Completed   Hepatitis C Screening  Completed   Zoster (Shingles) Vaccine  Completed   HPV Vaccine  Aged Out

## 2023-10-13 ENCOUNTER — Other Ambulatory Visit: Payer: Self-pay | Admitting: Orthopedic Surgery

## 2023-10-19 ENCOUNTER — Ambulatory Visit: Payer: Medicare Other | Admitting: "Endocrinology

## 2023-10-19 ENCOUNTER — Encounter: Payer: Self-pay | Admitting: "Endocrinology

## 2023-10-19 VITALS — BP 116/68 | HR 68 | Ht 63.0 in | Wt 173.6 lb

## 2023-10-19 DIAGNOSIS — E890 Postprocedural hypothyroidism: Secondary | ICD-10-CM | POA: Diagnosis not present

## 2023-10-19 DIAGNOSIS — R7303 Prediabetes: Secondary | ICD-10-CM | POA: Diagnosis not present

## 2023-10-19 DIAGNOSIS — E782 Mixed hyperlipidemia: Secondary | ICD-10-CM | POA: Diagnosis not present

## 2023-10-19 LAB — POCT GLYCOSYLATED HEMOGLOBIN (HGB A1C): Hemoglobin A1C: 5.5 % (ref 4.0–5.6)

## 2023-10-19 MED ORDER — LEVOTHYROXINE SODIUM 88 MCG PO TABS
88.0000 ug | ORAL_TABLET | Freq: Every day | ORAL | 3 refills | Status: DC
Start: 2023-10-19 — End: 2023-11-07

## 2023-10-19 NOTE — Progress Notes (Signed)
10/19/2023  Endocrinology follow-up note  Complaint: Follow-up for hypothyroidism  HPI  Laura [name] is a 72 y.o.-year-old female,  She has  medical history of goiter status post left hemithyroidectomy 20+ years ago, unremarkable thyroid/neck ultrasound in 2012.  She is currently on levothyroxine 88 mcg p.o. daily before breakfast.    Her pre-visit thyroid function tests are consistent with appropriate replacement.  She has no new complaints today.    She has hyperlipidemia, currently on Crestor 40 mg p.o. daily-responding to treatment.  She is fenofibrate 48 mg p.o. daily.  However, her previsit labs show uncontrolled lipid panel.  -  She denies any heat/cold intolerance, palpitations, tremors.    Pt denies feeling nodules in neck, hoarseness, dysphagia/odynophagia, SOB with lying down.  No history of radiation exposure to her head and neck.   No recent use of iodine supplements.   ROS: Constitutional: + steady weight, no fatigue, no subjective hyperthermia nor hypothermia.     PE: BP 116/68   Pulse 68   Ht 5\' 3"  (1.6 m)   Wt 173 lb 9.6 oz (78.7 kg)   BMI 30.75 kg/m  Wt Readings from Last 3 Encounters:  10/19/23 173 lb 9.6 oz (78.7 kg)  09/27/23 177 lb (80.3 kg)  09/22/23 176 lb (79.8 kg)    Physical Exam- Limited  Constitutional:  Body mass index is 30.75 kg/m. , not in acute distress, normal state of mind    Recent Results (from the past 2160 hours)  TSH     Status: None   Collection Time: 09/18/23  9:11 AM  Result Value Ref Range   TSH 0.898 0.450 - 4.500 uIU/mL  T4, free     Status: None   Collection Time: 09/18/23  9:11 AM  Result Value Ref Range   Free T4 1.24 0.82 - 1.77 ng/dL  Lipid panel     Status: None   Collection Time: 09/18/23  9:11 AM  Result Value Ref Range   Cholesterol, Total 172 100 - 199 mg/dL   Triglycerides 95 0 - 149 mg/dL   HDL 56 >16 mg/dL   VLDL Cholesterol Cal 17 5 - 40 mg/dL   LDL Chol Calc (NIH) 99 0 - 99 mg/dL   Chol/HDL  Ratio 3.1 0.0 - 4.4 ratio    Comment:                                   T. Chol/HDL Ratio                                             Men  Women                               1/2 Avg.Risk  3.4    3.3                                   Avg.Risk  5.0    4.4                                2X Avg.Risk  9.6    7.1  3X Avg.Risk 23.4   11.0   HgB A1c     Status: None   Collection Time: 10/19/23  9:53 AM  Result Value Ref Range   Hemoglobin A1C 5.5 4.0 - 5.6 %   HbA1c POC (<> result, manual entry)     HbA1c, POC (prediabetic range)     HbA1c, POC (controlled diabetic range)     ASSESSMENT: 1. Hypothyroidism, postsurgical 2.  Hyperlipidemia  PLAN:  -Her previsit thyroid function tests are consistent with appropriate replacement.  She is advised to continue levothyroxine 88 mcg p.o. daily before breakfast.     - We discussed about the correct intake of her thyroid hormone, on empty stomach at fasting, with water, separated by at least 30 minutes from breakfast and other medications,  and separated by more than 4 hours from calcium, iron, multivitamins, acid reflux medications (PPIs). -Patient is made aware of the fact that thyroid hormone replacement is needed for life, dose to be adjusted by periodic monitoring of thyroid function tests.  -Her surveillance thyroid/neck ultrasound was unremarkable.  Her previsit labs show improving lipid panel with LDL at 99 improving from 121.     She is advised to continue Crestor 40 mg p.o. nightly along with her fenofibrate 48 mg p.o. daily.    Side effects and precautions discussed with her.  She would also benefit from whole food plant-based diet which was reviewed discussed with her. Her recent POC  A1c is 5.5%,  improving from prior measurements consistent with prediabetes.  She is encouraged to stay on whole food plant-based diet, regular exercise. -Her screening bone density from October 2019 was normal.    She is  advised to maintain close follow up with her PMD for Primary care needs.   I spent  26  minutes in the care of the patient today including review of labs from Thyroid Function, CMP, and other relevant labs ; imaging/biopsy records (current and previous including abstractions from other facilities); face-to-face time discussing  her lab results and symptoms, medications doses, her options of short and long term treatment based on the latest standards of care / guidelines;   and documenting the encounter.  [name]  participated in the discussions, expressed understanding, and voiced agreement with the above plans.  All questions were answered to her satisfaction. she is encouraged to contact clinic should she have any questions or concerns prior to her return visit.    Marquis Lunch, MD Phone: [phone]  Fax: [phone]   10/19/2023, 10:03 AM

## 2023-10-30 ENCOUNTER — Ambulatory Visit (HOSPITAL_COMMUNITY)
Admission: RE | Admit: 2023-10-30 | Discharge: 2023-10-30 | Disposition: A | Discharge status: Home / Self Care | Payer: Medicare Other | Source: Ambulatory Visit | Attending: Family Medicine | Admitting: Family Medicine

## 2023-10-30 ENCOUNTER — Encounter (HOSPITAL_COMMUNITY): Payer: Self-pay

## 2023-10-30 DIAGNOSIS — Z1231 Encounter for screening mammogram for malignant neoplasm of breast: Secondary | ICD-10-CM | POA: Insufficient documentation

## 2023-11-01 ENCOUNTER — Encounter (HOSPITAL_COMMUNITY): Payer: Self-pay | Admitting: Emergency Medicine

## 2023-11-01 ENCOUNTER — Other Ambulatory Visit: Payer: Self-pay

## 2023-11-01 ENCOUNTER — Emergency Department (HOSPITAL_COMMUNITY): Payer: Medicare Other

## 2023-11-01 ENCOUNTER — Emergency Department (HOSPITAL_COMMUNITY)
Admission: EM | Admit: 2023-11-01 | Discharge: 2023-11-01 | Disposition: A | Discharge status: Home / Self Care | Payer: Medicare Other | Attending: Emergency Medicine | Admitting: Emergency Medicine

## 2023-11-01 DIAGNOSIS — K2900 Acute gastritis without bleeding: Secondary | ICD-10-CM

## 2023-11-01 DIAGNOSIS — K5900 Constipation, unspecified: Secondary | ICD-10-CM | POA: Insufficient documentation

## 2023-11-01 DIAGNOSIS — E039 Hypothyroidism, unspecified: Secondary | ICD-10-CM | POA: Insufficient documentation

## 2023-11-01 DIAGNOSIS — Z79899 Other long term (current) drug therapy: Secondary | ICD-10-CM | POA: Diagnosis not present

## 2023-11-01 DIAGNOSIS — K5730 Diverticulosis of large intestine without perforation or abscess without bleeding: Secondary | ICD-10-CM | POA: Diagnosis not present

## 2023-11-01 DIAGNOSIS — I10 Essential (primary) hypertension: Secondary | ICD-10-CM | POA: Insufficient documentation

## 2023-11-01 DIAGNOSIS — E876 Hypokalemia: Secondary | ICD-10-CM | POA: Insufficient documentation

## 2023-11-01 DIAGNOSIS — R109 Unspecified abdominal pain: Secondary | ICD-10-CM | POA: Diagnosis not present

## 2023-11-01 DIAGNOSIS — R1084 Generalized abdominal pain: Secondary | ICD-10-CM

## 2023-11-01 DIAGNOSIS — K7689 Other specified diseases of liver: Secondary | ICD-10-CM | POA: Diagnosis not present

## 2023-11-01 DIAGNOSIS — K2960 Other gastritis without bleeding: Secondary | ICD-10-CM | POA: Diagnosis not present

## 2023-11-01 LAB — CBC WITH DIFFERENTIAL/PLATELET
Abs Immature Granulocytes: 0 10*3/uL (ref 0.00–0.07)
Basophils Absolute: 0 10*3/uL (ref 0.0–0.1)
Basophils Relative: 1 %
Eosinophils Absolute: 0 10*3/uL (ref 0.0–0.5)
Eosinophils Relative: 1 %
HCT: 34.6 % — ABNORMAL LOW (ref 36.0–46.0)
Hemoglobin: 11.1 g/dL — ABNORMAL LOW (ref 12.0–15.0)
Immature Granulocytes: 0 %
Lymphocytes Relative: 32 %
Lymphs Abs: 1.4 10*3/uL (ref 0.7–4.0)
MCH: 28.2 pg (ref 26.0–34.0)
MCHC: 32.1 g/dL (ref 30.0–36.0)
MCV: 88 fL (ref 80.0–100.0)
Monocytes Absolute: 0.4 10*3/uL (ref 0.1–1.0)
Monocytes Relative: 8 %
Neutro Abs: 2.5 10*3/uL (ref 1.7–7.7)
Neutrophils Relative %: 58 %
Platelets: 295 10*3/uL (ref 150–400)
RBC: 3.93 MIL/uL (ref 3.87–5.11)
RDW: 12.1 % (ref 11.5–15.5)
WBC: 4.3 10*3/uL (ref 4.0–10.5)
nRBC: 0 % (ref 0.0–0.2)

## 2023-11-01 LAB — COMPREHENSIVE METABOLIC PANEL
ALT: 17 U/L (ref 0–44)
AST: 23 U/L (ref 15–41)
Albumin: 3.8 g/dL (ref 3.5–5.0)
Alkaline Phosphatase: 58 U/L (ref 38–126)
Anion gap: 9 (ref 5–15)
BUN: 11 mg/dL (ref 8–23)
CO2: 27 mmol/L (ref 22–32)
Calcium: 9.8 mg/dL (ref 8.9–10.3)
Chloride: 101 mmol/L (ref 98–111)
Creatinine, Ser: 0.88 mg/dL (ref 0.44–1.00)
GFR, Estimated: >60 mL/min (ref >60)
Glucose, Bld: 138 mg/dL — ABNORMAL HIGH (ref 70–99)
Potassium: 2.8 mmol/L — ABNORMAL LOW (ref 3.5–5.1)
Sodium: 137 mmol/L (ref 135–145)
Total Bilirubin: 0.9 mg/dL (ref 0.0–1.2)
Total Protein: 7.3 g/dL (ref 6.5–8.1)

## 2023-11-01 LAB — LIPASE, BLOOD: Lipase: 25 U/L (ref 11–51)

## 2023-11-01 MED ORDER — POTASSIUM CHLORIDE 10 MEQ/100ML IV SOLN
10.0000 meq | Freq: Once | INTRAVENOUS | Status: AC
  Administered 2023-11-01: 10 meq via INTRAVENOUS
  Filled 2023-11-01: qty 100

## 2023-11-01 MED ORDER — IOHEXOL 300 MG/ML  SOLN
100.0000 mL | Freq: Once | INTRAMUSCULAR | Status: AC | PRN
  Administered 2023-11-01: 100 mL via INTRAVENOUS

## 2023-11-01 MED ORDER — MAGNESIUM SULFATE 2 GM/50ML IV SOLN
2.0000 g | INTRAVENOUS | Status: AC
  Administered 2023-11-01: 2 g via INTRAVENOUS
  Filled 2023-11-01: qty 50

## 2023-11-01 MED ORDER — PANTOPRAZOLE SODIUM 40 MG PO TBEC
40.0000 mg | DELAYED_RELEASE_TABLET | Freq: Every day | ORAL | 1 refills | Status: DC
Start: 2023-11-01 — End: 2024-01-10

## 2023-11-01 MED ORDER — ALUM & MAG HYDROXIDE-SIMETH 200-200-20 MG/5ML PO SUSP
30.0000 mL | Freq: Once | ORAL | Status: AC
  Administered 2023-11-01: 30 mL via ORAL
  Filled 2023-11-01: qty 30

## 2023-11-01 MED ORDER — POTASSIUM CHLORIDE ER 10 MEQ PO TBCR: 10.0000 meq | EXTENDED_RELEASE_TABLET | Freq: Every day | ORAL | 0 refills | Status: DC

## 2023-11-01 MED ORDER — ONDANSETRON 4 MG PO TBDP: 4.0000 mg | ORAL_TABLET | Freq: Three times a day (TID) | ORAL | 0 refills | Status: DC | PRN

## 2023-11-01 MED ORDER — PANTOPRAZOLE SODIUM 40 MG IV SOLR
40.0000 mg | INTRAVENOUS | Status: AC
  Administered 2023-11-01: 40 mg via INTRAVENOUS
  Filled 2023-11-01: qty 10

## 2023-11-01 MED ORDER — IOHEXOL 300 MG/ML  SOLN: 100.0000 mL | Freq: Once | INTRAMUSCULAR | Status: DC | PRN

## 2023-11-01 NOTE — Discharge Instructions (Signed)
 Your testing today has been very reassuring, there is no specific abnormalities, the CT scan showed nothing that was concerning at all which is very reassuring.  Your blood work did show low potassium and we have given you some supplemental potassium here and for home.  I want you to take the following medications  Stop omeprazole   Start pantoprazole  once a day, take it at the same time every day  Zofran  is a medication which can help with nausea.  You may take 4 mg by mouth every 6 hours as needed if you are an adult, if your child under the age of 6 take half of a tablet or 2 mg every 6 hours as needed.  This should dissolve on your tongue within a short timeframe.  Wait about 30 minutes after taking it to help with drinking clear liquids.  Potassium once a day for at least 7 days, then see your family doctor to have your potassium level rechecked.  If you should develop severe worsening pain vomiting or any other concerning symptoms return to the ER immediately

## 2023-11-01 NOTE — ED Notes (Signed)
 Pt transported to CT via stretcher.

## 2023-11-01 NOTE — ED Provider Notes (Signed)
 [place] EMERGENCY DEPARTMENT AT [facility] Provider Note   CSN: [id number] Arrival date & time: 11/01/23  9072     History  Chief Complaint  Patient presents with   Abdominal Pain    Laura [name] is a 73 y.o. female.   Abdominal Pain    This patient is a 73 year old female with a history of hypertension, hypothyroidism and some acid reflux on omeprazole , she follows with Dr. Cindie, she actually had an endoscopy in June and then again in September which showed that she has a large polyp in her third part of the duodenum, she is scheduled to see a specialist to have this evaluated and potentially removed.  She was in her usual state of health until several days ago when she had some vomiting, since that time she has felt like she does not want to eat, she is not able to swallow anything feeling like it makes her gag and throw up, she does not have any significant tenderness in her lower abdomen but does have a discomfort in her upper abdomen which she states feels like a burning.  No coughing shortness of breath or chest pain, no dysuria diarrhea or rectal bleeding.  He does have a history of a oophorectomy and hysterectomy many years ago but no other abdominal surgery  Home Medications Prior to Admission medications   Medication Sig Start Date End Date Taking? Authorizing Provider  ondansetron  (ZOFRAN -ODT) 4 MG disintegrating tablet Take 1 tablet (4 mg total) by mouth every 8 (eight) hours as needed for nausea. 11/01/23  Yes Cleotilde Rogue, MD  pantoprazole  (PROTONIX ) 40 MG tablet Take 1 tablet (40 mg total) by mouth daily. 11/01/23 12/31/23 Yes Cleotilde Rogue, MD  potassium chloride  (KLOR-CON ) 10 MEQ tablet Take 1 tablet (10 mEq total) by mouth daily for 7 days. 11/01/23 11/08/23 Yes Cleotilde Rogue, MD  acetaminophen  (TYLENOL ) 500 MG tablet Take 1 tablet (500 mg total) by mouth every 6 (six) hours as needed. Patient taking differently: Take 1,500 mg by mouth daily as needed for mild  pain (pain score 1-3) or headache. 11/15/19   Avegno, Komlanvi S, FNP  amLODipine  (NORVASC ) 5 MG tablet TAKE 1 TABLET BY MOUTH DAILY 07/21/23   [name], MD  atorvastatin  (LIPITOR) 80 MG tablet TAKE 1 TABLET BY MOUTH ONCE  DAILY 07/05/23   [name], MD  buPROPion  (WELLBUTRIN  XL) 150 MG 24 hr tablet Take one tablet by mouth once daily 05/10/23   [name], MD  Calcium  Carbonate-Vit D-Min (CALCIUM  1200) 1200-1000 MG-UNIT CHEW Take one tablet by mouth two times daily 04/19/22   [name], MD  citalopram  (CELEXA ) 40 MG tablet TAKE 1 TABLET BY MOUTH DAILY 03/29/23   [name], MD  cyclobenzaprine  (FLEXERIL ) 10 MG tablet Take 1 tablet (10 mg total) by mouth 2 (two) times daily as needed. 06/28/23   Onesimo Oneil LABOR, MD  fenofibrate  (TRICOR ) 48 MG tablet TAKE 1 TABLET BY MOUTH DAILY 08/23/22   [name], MD  fluticasone  (FLONASE ) 50 MCG/ACT nasal spray Place 1 spray into both nostrils daily as needed for allergies or rhinitis. 02/28/23   [provider]  furosemide  (LASIX ) 20 MG tablet TAKE 1 TABLET(20 MG) BY MOUTH DAILY 09/15/23   [name], MD  gabapentin  (NEURONTIN ) 100 MG capsule Take three  capsules at bedtime for neuropathic pain 09/05/23   [name], MD  hydrochlorothiazide  (HYDRODIURIL ) 25 MG tablet Take 1 tablet (25 mg total) by  mouth daily. 09/05/23   [name], MD  levocetirizine (XYZAL ) 5 MG tablet Take 1 tablet (5 mg total) by mouth every evening. 09/05/23   [name], MD  levothyroxine  (SYNTHROID ) 88 MCG tablet Take 1 tablet (88 mcg total) by mouth daily before breakfast. 10/19/23   Nida, Gebreselassie W, MD  methocarbamol  (ROBAXIN ) 500 MG tablet Take 1 tablet (500 mg total) by mouth 3 (three) times daily. 09/22/23   Onesimo Oneil LABOR, MD  montelukast  (SINGULAIR ) 10 MG tablet Take 1 tablet (10 mg total) by mouth at bedtime. 08/12/22   [name], MD  Multiple Vitamin (MULTIVITAMIN WITH  MINERALS) TABS tablet Take 1 tablet by mouth daily.    [provider]  Polyethyl Glycol-Propyl Glycol (SYSTANE OP) Place 1 drop into both eyes daily as needed (dry eyes).    [provider]  potassium chloride  SA (KLOR-CON  M) 20 MEQ tablet Take two tablets three times daily for 5 days , then take two tablets twice daily Patient taking differently: Take 40 mEq by mouth 2 (two) times daily. Take two tablets three times daily for 5 days , then take two tablets twice daily 04/19/22   [name], MD  rosuvastatin  (CRESTOR ) 40 MG tablet TAKE 1 TABLET(40 MG) BY MOUTH DAILY 08/16/23   Nida, Gebreselassie W, MD  traMADol  (ULTRAM ) 50 MG tablet Take 1 tablet (50 mg total) by mouth every 12 (twelve) hours as needed. 10/13/23   Onesimo Oneil LABOR, MD      Allergies    Cozaar  [losartan  potassium], Dilaudid [hydromorphone hcl], Levaquin [levofloxacin], Nsaids, and Promethazine     Review of Systems   Review of Systems  Gastrointestinal:  Positive for abdominal pain.  All other systems reviewed and are negative.   Physical Exam Updated Vital Signs BP 110/64   Pulse 67   Temp 98.5 F (36.9 C)   Resp 15   SpO2 97%  Physical Exam Vitals and nursing note reviewed.  Constitutional:      General: She is not in acute distress.    Appearance: She is well-developed.  HENT:     Head: Normocephalic and atraumatic.     Mouth/Throat:     Pharynx: No oropharyngeal exudate.  Eyes:     General: No scleral icterus.       Right eye: No discharge.        Left eye: No discharge.     Conjunctiva/sclera: Conjunctivae normal.     Pupils: Pupils are equal, round, and reactive to light.  Neck:     Thyroid : No thyromegaly.     Vascular: No JVD.  Cardiovascular:     Rate and Rhythm: Normal rate and regular rhythm.     Heart sounds: Normal heart sounds. No murmur heard.    No friction rub. No gallop.  Pulmonary:     Effort: Pulmonary effort is normal. No respiratory distress.     Breath  sounds: Normal breath sounds. No wheezing or rales.  Abdominal:     General: Bowel sounds are normal. There is no distension.     Palpations: Abdomen is soft. There is no mass.     Tenderness: There is abdominal tenderness.     Comments: Mild tenderness mid epigastrium, no guarding no peritoneal signs, no other abdominal tenderness and no Beverley sign is present.  Musculoskeletal:        General: No tenderness. Normal range of motion.     Cervical back: Normal range of motion and neck supple.  Right lower leg: No edema.     Left lower leg: No edema.  Lymphadenopathy:     Cervical: No cervical adenopathy.  Skin:    General: Skin is warm and dry.     Findings: No erythema or rash.  Neurological:     Mental Status: She is alert.     Coordination: Coordination normal.  Psychiatric:        Behavior: Behavior normal.     ED Results / Procedures / Treatments   Labs (all labs ordered are listed, but only abnormal results are displayed) Labs Reviewed  COMPREHENSIVE METABOLIC PANEL - Abnormal; Notable for the following components:      Result Value   Potassium 2.8 (*)    Glucose, Bld 138 (*)    All other components within normal limits  CBC WITH DIFFERENTIAL/PLATELET - Abnormal; Notable for the following components:   Hemoglobin 11.1 (*)    HCT 34.6 (*)    All other components within normal limits  LIPASE, BLOOD    EKG None  Radiology CT ABDOMEN PELVIS W CONTRAST Result Date: 11/01/2023 CLINICAL DATA:  Acute abdominal pain. EXAM: CT ABDOMEN AND PELVIS WITH CONTRAST TECHNIQUE: Multidetector CT imaging of the abdomen and pelvis was performed using the standard protocol following bolus administration of intravenous contrast. RADIATION DOSE REDUCTION: This exam was performed according to the departmental dose-optimization program which includes automated exposure control, adjustment of the mA and/or kV according to patient size and/or use of iterative reconstruction technique.  CONTRAST:  OMNIPAQUE  IOHEXOL  300 MG/ML  SOLN COMPARISON:  04/25/2022 FINDINGS: Lower chest: Heart is normal size. Minimal calcified plaque over the left anterior descending coronary artery. Lung bases are clear. Hepatobiliary: Several liver hypodensities with the largest over the right lobe measuring 3.5 cm compatible with cysts and unchanged. Gallbladder and biliary tree are normal. Pancreas: Normal. Spleen: Normal. Adrenals/Urinary Tract: Adrenal glands are normal. Kidneys normal in size without hydronephrosis or nephrolithiasis. Kidneys are otherwise unchanged. Ureters and bladder are normal. Stomach/Bowel: The stomach and small bowel are normal. Appendix not visualized. Very minimal diverticulosis of the colon as the colon is otherwise unremarkable. Vascular/Lymphatic: Abdominal aorta is normal caliber. Remaining vascular structures are normal. No adenopathy. Reproductive: Status post hysterectomy. No adnexal masses. Other: No free fluid or focal inflammatory change. Musculoskeletal: No focal abnormality. IMPRESSION: 1. No acute findings in the abdomen/pelvis. 2. Stable liver cysts. 3. Very minimal diverticulosis of the colon. 4. Aortic atherosclerosis. Aortic Atherosclerosis (ICD10-I70.0). Electronically Signed   By: Toribio Agreste M.D.   On: 11/01/2023 12:31    Procedures Procedures    Medications Ordered in ED Medications  magnesium  sulfate IVPB 2 g 50 mL (2 g Intravenous New Bag/Given 11/01/23 1149)  potassium chloride  10 mEq in 100 mL IVPB (10 mEq Intravenous New Bag/Given 11/01/23 1155)  pantoprazole  (PROTONIX ) injection 40 mg (has no administration in time range)  alum & mag hydroxide-simeth (MAALOX/MYLANTA) 200-200-20 MG/5ML suspension 30 mL (30 mLs Oral Given 11/01/23 1107)  iohexol  (OMNIPAQUE ) 300 MG/ML solution 100 mL (100 mLs Intravenous Contrast Given 11/01/23 1128)    ED Course/ Medical Decision Making/ A&P                                 Medical Decision Making Amount and/or  Complexity of Data Reviewed Labs: ordered. Radiology: ordered.  Risk OTC drugs. Prescription drug management.   This patient presents to the ED for concern of abdominal burning, this  involves an extensive number of treatment options, and is a complaint that carries with it a high risk of complications and morbidity.  The differential diagnosis includes -- Overall this patient does not appear to have a surgical abdomen, her vital signs are unremarkable, she has had a significant decrease in her appetite secondary to some discomfort when she tries to eat which raises concern for possibly worsening gastritis, peptic ulcer disease, she was found to have gastritis on prior endoscopies, this could also be related to some type of intestinal obstruction from this duodenal polyp.  Will obtain labs and a CT scan, the patient is agreeable to the plan.    Co morbidities that complicate the patient evaluation  Gastritis   Additional history obtained:  Additional history obtained from medical record External records from outside source obtained and reviewed including prior endoscopies   Lab Tests:  I Ordered, and personally interpreted labs.  The pertinent results include: CBC metabolic panel lipase all unremarkable except for mild hypokalemia   Imaging Studies ordered:  I ordered imaging studies including CT scan of the abdomen and pelvis I independently visualized and interpreted imaging which showed no acute intra-abdominal or pelvic findings to suggest a cause for pain I agree with the radiologist interpretation   Cardiac Monitoring: / EKG:  The patient was maintained on a cardiac monitor.  I personally viewed and interpreted the cardiac monitored which showed an underlying rhythm of: Normal sinus rhythm    Problem List / ED Course / Critical interventions / Medication management  Overall patient appears well, given medications including magnesium  and potassium, vital signs are  normal, she has no need for acute intervention or admitting to the hospital.  She will be given prescriptions for the following medications I ordered medication including pantoprazole , Zofran , potassium supplements for home Reevaluation of the patient after these medicines showed that the patient improved I have reviewed the patients home medicines and have made adjustments as needed   Social Determinants of Health:  None   Test / Admission - Considered:  Considered admission but the patient is stable for discharge         Final Clinical Impression(s) / ED Diagnoses Final diagnoses:  Generalized abdominal pain  Other acute gastritis without hemorrhage  Hypokalemia    Rx / DC Orders ED Discharge Orders          Ordered    ondansetron  (ZOFRAN -ODT) 4 MG disintegrating tablet  Every 8 hours PRN        11/01/23 1238    potassium chloride  (KLOR-CON ) 10 MEQ tablet  Daily        11/01/23 1238    pantoprazole  (PROTONIX ) 40 MG tablet  Daily        11/01/23 1238              Cleotilde Rogue, MD 11/01/23 1240

## 2023-11-01 NOTE — ED Notes (Signed)
AVS with prescriptions provided to and discussed with patient. Pt verbalizes understanding of discharge instructions and denies any questions or concerns at this time. Pt has ride home. Pt ambulated out of department independently with steady gait.

## 2023-11-01 NOTE — ED Triage Notes (Signed)
 Pt reports epigastric abdominal pain and abdominal burning.

## 2023-11-04 ENCOUNTER — Other Ambulatory Visit: Payer: Self-pay | Admitting: "Endocrinology

## 2023-11-04 DIAGNOSIS — E890 Postprocedural hypothyroidism: Secondary | ICD-10-CM

## 2023-11-09 ENCOUNTER — Telehealth: Payer: Self-pay | Admitting: *Deleted

## 2023-11-09 NOTE — Progress Notes (Signed)
 Transition Care Management Unsuccessful Follow-up Telephone Call  Date of discharge and from where:  [facility]  11/01/2023 Attempts:  1st Attempt  Reason for unsuccessful TCM follow-up call:  No answer/busy

## 2023-11-10 ENCOUNTER — Telehealth: Payer: Self-pay | Admitting: *Deleted

## 2023-11-10 NOTE — Progress Notes (Signed)
 Transition Care Management Unsuccessful Follow-up Telephone Call  Date of discharge and from where:  [facility]  10/31/2022  Attempts:  2nd Attempt  Reason for unsuccessful TCM follow-up call:  Left voice message

## 2023-11-13 DIAGNOSIS — R5382 Chronic fatigue, unspecified: Secondary | ICD-10-CM | POA: Diagnosis not present

## 2023-11-13 DIAGNOSIS — R7303 Prediabetes: Secondary | ICD-10-CM | POA: Diagnosis not present

## 2023-11-13 DIAGNOSIS — D649 Anemia, unspecified: Secondary | ICD-10-CM | POA: Diagnosis not present

## 2023-11-13 DIAGNOSIS — E782 Mixed hyperlipidemia: Secondary | ICD-10-CM | POA: Diagnosis not present

## 2023-11-13 DIAGNOSIS — I10 Essential (primary) hypertension: Secondary | ICD-10-CM | POA: Diagnosis not present

## 2023-11-13 DIAGNOSIS — E559 Vitamin D deficiency, unspecified: Secondary | ICD-10-CM | POA: Diagnosis not present

## 2023-11-14 ENCOUNTER — Ambulatory Visit: Payer: Medicare Other | Admitting: Family Medicine

## 2023-11-14 ENCOUNTER — Encounter: Payer: Self-pay | Admitting: Family Medicine

## 2023-11-14 ENCOUNTER — Encounter: Payer: Self-pay | Admitting: Gastroenterology

## 2023-11-14 VITALS — BP 123/76 | HR 64 | Ht 63.0 in | Wt 173.1 lb

## 2023-11-14 DIAGNOSIS — Z0001 Encounter for general adult medical examination with abnormal findings: Secondary | ICD-10-CM

## 2023-11-14 DIAGNOSIS — M5410 Radiculopathy, site unspecified: Secondary | ICD-10-CM | POA: Diagnosis not present

## 2023-11-14 LAB — CBC WITH DIFFERENTIAL/PLATELET
Basophils Absolute: 0.1 10*3/uL (ref 0.0–0.2)
Basos: 1 %
EOS (ABSOLUTE): 0.1 10*3/uL (ref 0.0–0.4)
Eos: 2 %
Hematocrit: 34.3 % (ref 34.0–46.6)
Hemoglobin: 10.6 g/dL — ABNORMAL LOW (ref 11.1–15.9)
Immature Grans (Abs): 0 10*3/uL (ref 0.0–0.1)
Immature Granulocytes: 0 %
Lymphocytes Absolute: 1.5 10*3/uL (ref 0.7–3.1)
Lymphs: 38 %
MCH: 27.6 pg (ref 26.6–33.0)
MCHC: 30.9 g/dL — ABNORMAL LOW (ref 31.5–35.7)
MCV: 89 fL (ref 79–97)
Monocytes Absolute: 0.3 10*3/uL (ref 0.1–0.9)
Monocytes: 7 %
Neutrophils Absolute: 2.1 10*3/uL (ref 1.4–7.0)
Neutrophils: 52 %
Platelets: 302 10*3/uL (ref 150–450)
RBC: 3.84 x10E6/uL (ref 3.77–5.28)
RDW: 11.9 % (ref 11.7–15.4)
WBC: 4 10*3/uL (ref 3.4–10.8)

## 2023-11-14 LAB — CMP14+EGFR
ALT: 14 [IU]/L (ref 0–32)
AST: 15 [IU]/L (ref 0–40)
Albumin: 4 g/dL (ref 3.8–4.8)
Alkaline Phosphatase: 82 [IU]/L (ref 44–121)
BUN/Creatinine Ratio: 11 — ABNORMAL LOW (ref 12–28)
BUN: 11 mg/dL (ref 8–27)
Bilirubin Total: 0.4 mg/dL (ref 0.0–1.2)
CO2: 23 mmol/L (ref 20–29)
Calcium: 9.8 mg/dL (ref 8.7–10.3)
Chloride: 102 mmol/L (ref 96–106)
Creatinine, Ser: 0.96 mg/dL (ref 0.57–1.00)
Globulin, Total: 2.8 g/dL (ref 1.5–4.5)
Glucose: 108 mg/dL — ABNORMAL HIGH (ref 70–99)
Potassium: 3.4 mmol/L — ABNORMAL LOW (ref 3.5–5.2)
Sodium: 141 mmol/L (ref 134–144)
Total Protein: 6.8 g/dL (ref 6.0–8.5)
eGFR: 62 mL/min/{1.73_m2} (ref >59)

## 2023-11-14 LAB — LIPID PANEL
Chol/HDL Ratio: 3.9 {ratio} (ref 0.0–4.4)
Cholesterol, Total: 224 mg/dL — ABNORMAL HIGH (ref 100–199)
HDL: 57 mg/dL (ref >39)
LDL Chol Calc (NIH): 145 mg/dL — ABNORMAL HIGH (ref 0–99)
Triglycerides: 125 mg/dL (ref 0–149)
VLDL Cholesterol Cal: 22 mg/dL (ref 5–40)

## 2023-11-14 LAB — VITAMIN D 25 HYDROXY (VIT D DEFICIENCY, FRACTURES): Vit D, 25-Hydroxy: 38.1 ng/mL (ref 30.0–100.0)

## 2023-11-14 MED ORDER — POTASSIUM CHLORIDE ER 10 MEQ PO TBCR: EXTENDED_RELEASE_TABLET | ORAL | 5 refills | Status: DC

## 2023-11-14 MED ORDER — GABAPENTIN 100 MG PO CAPS: ORAL_CAPSULE | ORAL | 5 refills | Status: DC

## 2023-11-14 MED ORDER — METHYLPREDNISOLONE ACETATE 80 MG/ML IJ SUSP
80.0000 mg | Freq: Once | INTRAMUSCULAR | Status: AC
  Administered 2023-11-14: 80 mg via INTRAMUSCULAR

## 2023-11-14 NOTE — Assessment & Plan Note (Signed)
 Current flare , depo medrol 80 mg iM in office and refer for pT x 4 to 8 6 weeks then re eval has disc disease and has been having recurrent flares

## 2023-11-14 NOTE — Progress Notes (Signed)
    Laura [name]     MRN: [id number]      DOB: Mar 08, 1951  Chief Complaint  Patient presents with   Annual Exam    CPE potassium was low at ER only gave her 7 tabs gastro issues polyp in intestines requesting shot for sciatic nerve     HPI: Patient is in for annual physical exam. Uncontrolled back pain to right thigh is addressed Recent labs,  are reviewed. Immunization is reviewed , and  needs covid vaccine  PE: BP 123/76 (BP Location: Right Arm, Patient Position: Sitting, Cuff Size: Normal)   Pulse 64   Ht 5' 3 (1.6 m)   Wt 173 lb 1.3 oz (78.5 kg)   SpO2 95%   BMI 30.66 kg/m   Pleasant  female, alert and oriented x 3, in no cardio-pulmonary distress. Afebrile. HEENT No facial trauma or asymetry. Sinuses non tender.  Extra occullar muscles intact.. External ears normal, . Neck: supple, no adenopathy,JVD or thyromegaly.No bruits.  Chest: Clear to ascultation bilaterally.No crackles or wheezes. Non tender to palpation  Breast: Asymptomatic and mammogram is UTD, not examined  Cardiovascular system; Heart sounds normal,  S1 and  S2 ,no S3.  No murmur, or thrill. Apical beat not displaced Peripheral pulses normal.  Abdomen: Soft, non tender, no organomegaly or masses.    Musculoskeletal exam: Decreased  ROM of lumbar spine,  adequate in hips , shoulders and reduced in right  knese  deformity ,swelling and  crepitus noted.in right knee No muscle wasting or atrophy.   Neurologic: Cranial nerves 2 to 12 intact. Power, tone ,sensation and reflexes normal throughout. No disturbance in gait. No tremor.  Skin: Intact, no ulceration, erythema , scaling or rash noted. Pigmentation normal throughout  Psych; Normal mood and affect. Judgement and concentration normal   Assessment & Plan:  Encounter for Medicare annual examination with abnormal findings Annual exam as documented.  Immunization and cancer screening needs are specifically addressed at this  visit.   Back pain with radiculopathy Current flare , depo medrol  80 mg iM in office and refer for pT x 4 to 8 6 weeks then re eval has disc disease and has been having recurrent flares

## 2023-11-14 NOTE — Assessment & Plan Note (Addendum)
 Annual exam as documented. . Immunization and cancer screening needs are specifically addressed at this visit.

## 2023-11-14 NOTE — Patient Instructions (Signed)
 F/U in 8 weeks WITH medication for med check and re eval back pain  Depo medrol  80 mg IM in office today  You are referred for PT for chronic back pain with right sciatica   Pls start taking potassium one daily , this is prescribed  Thanks for choosing [facility], we consider it a privelige to serve you.

## 2023-11-16 ENCOUNTER — Other Ambulatory Visit: Payer: Self-pay

## 2023-11-16 DIAGNOSIS — R5382 Chronic fatigue, unspecified: Secondary | ICD-10-CM

## 2023-11-16 DIAGNOSIS — R7303 Prediabetes: Secondary | ICD-10-CM

## 2023-11-16 DIAGNOSIS — D649 Anemia, unspecified: Secondary | ICD-10-CM

## 2023-11-17 ENCOUNTER — Other Ambulatory Visit: Payer: Self-pay

## 2023-11-17 DIAGNOSIS — M25532 Pain in left wrist: Secondary | ICD-10-CM | POA: Diagnosis not present

## 2023-11-17 DIAGNOSIS — E782 Mixed hyperlipidemia: Secondary | ICD-10-CM

## 2023-11-17 DIAGNOSIS — M7918 Myalgia, other site: Secondary | ICD-10-CM | POA: Diagnosis not present

## 2023-11-17 DIAGNOSIS — G5603 Carpal tunnel syndrome, bilateral upper limbs: Secondary | ICD-10-CM | POA: Diagnosis not present

## 2023-11-17 DIAGNOSIS — G5602 Carpal tunnel syndrome, left upper limb: Secondary | ICD-10-CM | POA: Diagnosis not present

## 2023-11-17 MED ORDER — ROSUVASTATIN CALCIUM 40 MG PO TABS: 40.0000 mg | ORAL_TABLET | Freq: Every day | ORAL | 0 refills | Status: DC

## 2023-11-18 LAB — HEMOGLOBIN A1C
Est. average glucose Bld gHb Est-mCnc: 105 mg/dL
Hgb A1c MFr Bld: 5.3 % (ref 4.8–5.6)

## 2023-11-18 LAB — FOLATE: Folate: 17.3 ng/mL (ref >3.0)

## 2023-11-18 LAB — SPECIMEN STATUS REPORT

## 2023-11-18 LAB — VITAMIN B12: Vitamin B-12: 1119 pg/mL (ref 232–1245)

## 2023-11-18 LAB — IRON: Iron: 79 ug/dL (ref 27–139)

## 2023-11-19 DIAGNOSIS — G8929 Other chronic pain: Secondary | ICD-10-CM | POA: Insufficient documentation

## 2023-11-23 ENCOUNTER — Ambulatory Visit (HOSPITAL_COMMUNITY): Payer: Medicare Other | Attending: Family Medicine

## 2023-11-23 ENCOUNTER — Other Ambulatory Visit: Payer: Self-pay

## 2023-11-23 DIAGNOSIS — M5416 Radiculopathy, lumbar region: Secondary | ICD-10-CM | POA: Diagnosis not present

## 2023-11-23 DIAGNOSIS — R293 Abnormal posture: Secondary | ICD-10-CM | POA: Diagnosis not present

## 2023-11-23 DIAGNOSIS — R2689 Other abnormalities of gait and mobility: Secondary | ICD-10-CM | POA: Insufficient documentation

## 2023-11-23 DIAGNOSIS — M542 Cervicalgia: Secondary | ICD-10-CM | POA: Insufficient documentation

## 2023-11-23 DIAGNOSIS — M5410 Radiculopathy, site unspecified: Secondary | ICD-10-CM

## 2023-11-23 NOTE — Therapy (Signed)
OUTPATIENT PHYSICAL THERAPY THORACOLUMBAR EVALUATION   Patient Name: Laura [name] MRN: [id number] DOB:August 30, 1951, 73 y.o., female Today's Date: 11/23/2023  END OF SESSION:  PT End of Session - 11/23/23 1347     Visit Number 1    Number of Visits 8    Date for PT Re-Evaluation 12/21/23    Authorization Type [employer] Medicare (requested 8 visits)    PT Start Time 0940    PT Stop Time 1010    PT Time Calculation (min) 30 min    Activity Tolerance Patient tolerated treatment well    Behavior During Therapy [facility] for tasks assessed/performed             Past Medical History:  Diagnosis Date   Anxiety    At low risk for fall 08/15/2018   Cystitis    Depression    Gastritis    GERD (gastroesophageal reflux disease)    Hashimoto's thyroiditis    Hx    Hyperlipidemia    Hypertension    Hypothyroidism    Normocytic anemia 2009 Hb 9.9-11.1   Osteopenia    Shoulder pain    Urinary incontinence    Past Surgical History:  Procedure Laterality Date   Bilateral tubal ligation     BIOPSY  04/21/2020   Procedure: BIOPSY;  Surgeon: Corbin Ade, MD;  [facility];  Service: Endoscopy;;   BIOPSY  04/20/2023   Procedure: BIOPSY;  Surgeon: [name], DO;  [facility];  Service: Endoscopy;;   BREAST EXCISIONAL BIOPSY  2010   Excisional biopsy of benign left breast mass -lopoma    COLONOSCOPY  08/26/2011   SLF: 1. Internal hemorrhoids   COLONOSCOPY N/A 10/06/2015   NWG:NFAO fissure or internal hemorrhoids/mild sigmoid colitis. benign colonic path    COLONOSCOPY WITH PROPOFOL N/A 03/15/2021   Procedure: COLONOSCOPY WITH PROPOFOL;  Surgeon: [name], DO;  [facility];  Service: Endoscopy;  Laterality: N/A;  3:00pm   colonscopy  2005   Dr. Katrinka Blazing   ENTEROSCOPY N/A 07/25/2023   Procedure: ENTEROSCOPY;  Surgeon: [name], DO;  [facility];  Service: Endoscopy;  Laterality: N/A;  1:45 pm, asa 2    ESOPHAGOGASTRODUODENOSCOPY  12/2009   chronic gastritis   ESOPHAGOGASTRODUODENOSCOPY N/A 05/17/2017   Dr. Darrick Penna; gastritis, normal small bowel biopsies, multiple gastric polyps. fundic gland polyps   ESOPHAGOGASTRODUODENOSCOPY (EGD) WITH PROPOFOL N/A 04/21/2020   Rourk: multiple gastric polyps, biopsy of one c/w fundic gland. gastric biopsy showed mild chronic gastritis but no H.pylori   ESOPHAGOGASTRODUODENOSCOPY (EGD) WITH PROPOFOL N/A 04/20/2023   Procedure: ESOPHAGOGASTRODUODENOSCOPY (EGD) WITH PROPOFOL;  Surgeon: [name], DO;  [facility];  Service: Endoscopy;  Laterality: N/A;  1:00 pm, asa 3, pt knows to arrive at 6:30   GIVENS CAPSULE STUDY N/A 05/30/2017   gastritis, no source for anemia identified   left knee surgery Left 12/30/2015   PARTIAL KNEE ARTHROPLASTY Left 08/09/2017   Procedure: LEFT UNICOMPARTMENTAL KNEE;  Surgeon: Ollen Gross, MD;  Location: [facility];  Service: Orthopedics;  Laterality: Left;   POLYPECTOMY  04/21/2020   Procedure: POLYPECTOMY;  Surgeon: Corbin Ade, MD;  [facility];  Service: Endoscopy;;  gastric   Resection of left lobe of thyroid     right carpal tunnel release     rt. knee athroscopy  2004   SAVORY DILATION N/A 05/17/2017   Procedure: SAVORY DILATION;  Surgeon: West Bali, MD;  [facility];  Service: Endoscopy;  Laterality: N/A;   TOTAL ABDOMINAL HYSTERECTOMY  1994   UMBILICAL HERNIA REPAIR     Urethral dilation for stenosis  2009   Patient Active Problem List   Diagnosis Date Noted   Encounter for Medicare annual examination with abnormal findings 11/14/2023   Duodenal adenoma 07/04/2023   Right knee pain 06/15/2023   Left wrist pain 04/24/2023   Neuropathic pain 03/25/2023   Osteoarthritis of right knee 03/13/2023   Fatigue 11/08/2022   Chronic pain of right knee 09/09/2022   Lower leg edema 06/12/2022   Prediabetes 01/12/2022   Radiculopathy due to cervical spondylosis at single  level 12/16/2021   Neck pain, bilateral 07/05/2021   Unsteady gait when walking 06/02/2021   Numbness and tingling in both hands 06/02/2021   Thumb pain, left 04/22/2021   Flushing 10/19/2020   Depression, major, single episode, in partial remission (HCC) 06/21/2020   Hot flashes 03/11/2020   Vitamin D deficiency 03/11/2020   Sensation of feeling hot 03/05/2020   Lumbar spine painful on movement 12/02/2019   Overweight (BMI 25.0-29.9) 06/17/2019   History of arthroplasty of left knee 01/04/2018   OA (osteoarthritis) of knee 08/09/2017   Dyspepsia 03/02/2017   Headache 01/06/2017   Constipation 02/05/2016   Allergic rhinitis 10/08/2014   GAD (generalized anxiety disorder) 09/23/2014   Back pain with radiculopathy 04/14/2014   Right shoulder pain 12/19/2011   Postsurgical hypothyroidism 06/22/2010   Normocytic anemia 08/04/2009   Gastroesophageal reflux disease 04/20/2009   Mixed hyperlipidemia 05/16/2008   Essential hypertension 05/16/2008   Disorder of bone and cartilage 05/16/2008   Urinary incontinence 05/16/2008    PCP: Kerri Perches, MD  REFERRING PROVIDER: Kerri Perches, MD  REFERRING DIAG: M54.10 (ICD-10-CM) - Back pain with radiculopathy  Rationale for Evaluation and Treatment: Rehabilitation  THERAPY DIAG:  Back pain with radiculopathy  ONSET DATE: 2024  SUBJECTIVE:                                                                                                                                                                                           SUBJECTIVE STATEMENT: EVAL: Arrives to the clinic with low back pain (see below). Denies any numbness or weakness on the legs. Condition started 2024 without apparent reason and gradually got worse. Denies any trauma. Patient went to her MD and was told she had a bulging disc and her sciatic nerve was affected. Patient had injections in the past which helped. Recently, patient was referred to outpatient PT  evaluation and management.  PERTINENT HISTORY:  Partial L knee arthroplasty. Polyp on her colon (will  have surgery soon)  PAIN:  Are you having pain? Yes: NPRS scale: 8/10 Pain location: R side of the low back Pain description: sharp, throbbing Aggravating factors: comes in randomly, turning R/L, standing up Relieving factors: roll on liniment  PRECAUTIONS: None  RED FLAGS: None   WEIGHT BEARING RESTRICTIONS: No  FALLS:  Has patient fallen in last 6 months? No  LIVING ENVIRONMENT: Lives with: lives with their spouse Lives in: House/apartment Stairs: Yes: External: 1 steps; none Has following equipment at home: Single point cane and Walker - 4 wheeled  OCCUPATION: retired  PLOF: Independent and Independent with basic ADLs  PATIENT GOALS: "I want to get rid of this pain on my back"  NEXT MD VISIT: March 2024  OBJECTIVE:  Note: Objective measures were completed at Evaluation unless otherwise noted.  DIAGNOSTIC FINDINGS:  11/01/23 CT ABDOMEN AND PELVIS WITH CONTRAST   TECHNIQUE: Multidetector CT imaging of the abdomen and pelvis was performed using the standard protocol following bolus administration of intravenous contrast.   RADIATION DOSE REDUCTION: This exam was performed according to the departmental dose-optimization program which includes automated exposure control, adjustment of the mA and/or kV according to patient size and/or use of iterative reconstruction technique.   CONTRAST:  OMNIPAQUE IOHEXOL 300 MG/ML  SOLN   COMPARISON:  04/25/2022   FINDINGS: Lower chest: Heart is normal size. Minimal calcified plaque over the left anterior descending coronary artery. Lung bases are clear.   Hepatobiliary: Several liver hypodensities with the largest over the right lobe measuring 3.5 cm compatible with cysts and unchanged. Gallbladder and biliary tree are normal.   Pancreas: Normal.   Spleen: Normal.   Adrenals/Urinary Tract: Adrenal glands are  normal. Kidneys normal in size without hydronephrosis or nephrolithiasis. Kidneys are otherwise unchanged. Ureters and bladder are normal.   Stomach/Bowel: The stomach and small bowel are normal. Appendix not visualized. Very minimal diverticulosis of the colon as the colon is otherwise unremarkable.   Vascular/Lymphatic: Abdominal aorta is normal caliber. Remaining vascular structures are normal. No adenopathy.   Reproductive: Status post hysterectomy. No adnexal masses.   Other: No free fluid or focal inflammatory change.   Musculoskeletal: No focal abnormality.   IMPRESSION: 1. No acute findings in the abdomen/pelvis. 2. Stable liver cysts. 3. Very minimal diverticulosis of the colon. 4. Aortic atherosclerosis.   Aortic Atherosclerosis (ICD10-I70.0).  PATIENT SURVEYS:  Modified Oswestry 21/50 = 42%   COGNITION: Overall cognitive status: Within functional limits for tasks assessed     SENSATION: WFL  MUSCLE LENGTH: Hamstrings: moderate restriction on B Thomas test: moderate restriction on B Piriformis: moderate restriction on B  POSTURE: rounded shoulders, forward head, and anterior pelvic tilt  PALPATION: Grade 1 tenderness on the R paraspinals on the low back  LUMBAR ROM:   AROM eval  Flexion 75%  Extension 100%  Right lateral flexion   Left lateral flexion   Right rotation 100%  Left rotation 100%   (Blank rows = not tested)  LOWER EXTREMITY ROM:     Active  Right eval Left eval  Hip flexion [facility]  Hip extension    Hip abduction [facility]  Hip adduction    Hip internal rotation    Hip external rotation    Knee flexion [facility]  Knee extension [facility]  Ankle dorsiflexion [facility]  Ankle plantarflexion [facility] WFL  Ankle inversion    Ankle eversion     (Blank rows = not tested)  LOWER EXTREMITY MMT:  MMT Right eval Left eval  Hip flexion 4- 4-  Hip extension 3+ 3+  Hip abduction 4- 4-  Hip adduction    Hip internal rotation    Hip  external rotation    Knee flexion 4 4  Knee extension 4 4  Ankle dorsiflexion 4 4  Ankle plantarflexion 4 4  Ankle inversion    Ankle eversion     (Blank rows = not tested)  LUMBAR SPECIAL TESTS:  Straight leg raise test: aggravates pain on the low back and FABER test: L FABER aggravates pain on the R lumbar area  FUNCTIONAL TESTS:  5 times sit to stand: 13.40 sec 2 minute walk test: 535 ft  GAIT: Distance walked: 535 ft Assistive device utilized: None Level of assistance: Complete Independence Comments: done during , no gait deviations seen  TREATMENT DATE:  11/23/23 Evaluation and patient education done                                                                                                                                 PATIENT EDUCATION:  Education details: Educated on the pathoanatomy of low back pain. Educated on the goals and course of rehab.  Person educated: Patient Education method: Explanation Education comprehension: verbalized understanding  HOME EXERCISE PROGRAM: None provided to date.  ASSESSMENT:  CLINICAL IMPRESSION: EVAL: Patient is a 72 y.o. female who was seen today for physical therapy evaluation and treatment for back pain with radiculopathy. Patient's condition is further defined by difficulty with sit-to-stand and bending over due to pain, weakness, and decreased soft tissue extensibility. Skilled PT is required to address the impairments and functional limitations listed below.    OBJECTIVE IMPAIRMENTS: decreased strength, impaired flexibility, and pain.   ACTIVITY LIMITATIONS: carrying, lifting, bending, standing, squatting, and transfers  PARTICIPATION LIMITATIONS: meal prep, cleaning, laundry, driving, shopping, community activity, and yard work  PERSONAL FACTORS: Age and Time since onset of injury/illness/exacerbation are also affecting patient's functional outcome.   REHAB POTENTIAL: Good  CLINICAL DECISION MAKING:  Stable/uncomplicated  EVALUATION COMPLEXITY: Low   GOALS: Goals reviewed with patient? Yes  SHORT TERM GOALS: Target date: 12/07/23  Pt will demonstrate indep in HEP to facilitate carry-over of skilled services and improve functional outcomes Goal status: INITIAL  LONG TERM GOALS: Target date: 12/21/2023  Pt will demonstrate a decrease in modified ODI score by 13-14% to demonstrate significant improvement in ADLs Baseline: 42% Goal status: INITIAL  2.  Pt will demonstrate increase in lumbar flex ROM by 25%  to facilitate ease in ADLs Baseline: 75% Goal status: INITIAL  3.  Pt will decrease 5TSTS by at least 3 seconds in order to demonstrate clinically significant improvement in LE strength   Baseline: 13.40 sec Goal status: INITIAL  4.  Pt will demonstrate increase in LE strength to 4/5 to facilitate ease and safety in ambulation Baseline: 3+/5 Goal status: INITIAL  PLAN:  PT FREQUENCY: 2x/week  PT DURATION:  4 weeks  PLANNED INTERVENTIONS: [id number]- PT Re-evaluation, 97110-Therapeutic exercises, 97530- Therapeutic activity, [id number]- Neuromuscular re-education, 97535- Self Care, [id number]- Manual therapy, and Patient/Family education.  PLAN FOR NEXT SESSION: Provide HEP. Begin core and hip strengthening/mobility activities.   Tish Frederickson. Zakira Ressel, PT, DPT, OCS Board-Certified Clinical Specialist in Orthopedic PT PT Compact Privilege # ([place]): [phone] T 11/23/2023, 5:02 PM   [facility] Medicare Auth Request Information  Date of referral: 11/14/23 Referring provider: Kerri Perches, MD Referring diagnosis (ICD 10)? M54.10 (ICD-10-CM) - Back pain with radiculopathy Treatment diagnosis (ICD 10)? (if different than referring diagnosis) M54.10  Functional Tool Score: Modified Oswestry = 42%, 2 minute walk test = 535 ft, 5 times sit-to-stand = 13.40 sec  What was this (referring dx) caused by? Unspecified  Nature of Condition: Chronic (continuous duration > 3 months)   Laterality:  Rt  Current Functional Measure Score: Other modified Oswestry = 42%  Objective measurements identify impairments when they are compared to normal values, the uninvolved extremity, and prior level of function.  [x]  Yes  []  No  Objective assessment of functional ability: Minimal functional limitations   Briefly describe symptoms: right low back pain  How did symptoms start: unknown, gradually worsened  Average pain intensity:  Last 24 hours: 8/10  Past week: 8/10  How often does the pt experience symptoms? Intermittently  How much have the symptoms interfered with usual daily activities? Moderately  How has condition changed since care began at this facility? NA - initial visit  In general, how is the patients overall health? Fair   BACK PAIN (STarT Back Screening Tool) No

## 2023-11-28 ENCOUNTER — Encounter (HOSPITAL_COMMUNITY): Payer: Self-pay

## 2023-11-28 ENCOUNTER — Ambulatory Visit (HOSPITAL_COMMUNITY): Payer: Medicare Other

## 2023-11-28 ENCOUNTER — Ambulatory Visit: Payer: Medicare Other | Admitting: Gastroenterology

## 2023-11-28 DIAGNOSIS — M5410 Radiculopathy, site unspecified: Secondary | ICD-10-CM | POA: Diagnosis not present

## 2023-11-28 DIAGNOSIS — M542 Cervicalgia: Secondary | ICD-10-CM | POA: Diagnosis not present

## 2023-11-28 DIAGNOSIS — M5416 Radiculopathy, lumbar region: Secondary | ICD-10-CM | POA: Diagnosis not present

## 2023-11-28 DIAGNOSIS — R293 Abnormal posture: Secondary | ICD-10-CM

## 2023-11-28 DIAGNOSIS — R2689 Other abnormalities of gait and mobility: Secondary | ICD-10-CM

## 2023-11-28 NOTE — Therapy (Signed)
OUTPATIENT PHYSICAL THERAPY THORACOLUMBAR TREATMENT   Patient Name: Laura [name] MRN: [id number] DOB:Feb 25, 1951, 73 y.o., female Today's Date: 11/28/2023  END OF SESSION:  PT End of Session - 11/28/23 1651     Visit Number 2    Number of Visits 8    Date for PT Re-Evaluation 12/21/23    Authorization Type [employer] Medicare (requested 8 visits)    Authorization Time Period uhc approved 8 visits from 11/23/2023-12/21/2023    Authorization - Visit Number 1    Authorization - Number of Visits 8    Progress Note Due on Visit 8    PT Start Time 1018    PT Stop Time 1058    PT Time Calculation (min) 40 min    Activity Tolerance Patient tolerated treatment well    Behavior During Therapy [facility] for tasks assessed/performed              Past Medical History:  Diagnosis Date   Anxiety    At low risk for fall 08/15/2018   Cystitis    Depression    Gastritis    GERD (gastroesophageal reflux disease)    Hashimoto's thyroiditis    Hx    Hyperlipidemia    Hypertension    Hypothyroidism    Normocytic anemia 2009 Hb 9.9-11.1   Osteopenia    Shoulder pain    Urinary incontinence    Past Surgical History:  Procedure Laterality Date   Bilateral tubal ligation     BIOPSY  04/21/2020   Procedure: BIOPSY;  Surgeon: Corbin Ade, MD;  [facility];  Service: Endoscopy;;   BIOPSY  04/20/2023   Procedure: BIOPSY;  Surgeon: [name], DO;  [facility];  Service: Endoscopy;;   BREAST EXCISIONAL BIOPSY  2010   Excisional biopsy of benign left breast mass -lopoma    COLONOSCOPY  08/26/2011   SLF: 1. Internal hemorrhoids   COLONOSCOPY N/A 10/06/2015   VHQ:IONG fissure or internal hemorrhoids/mild sigmoid colitis. benign colonic path    COLONOSCOPY WITH PROPOFOL N/A 03/15/2021   Procedure: COLONOSCOPY WITH PROPOFOL;  Surgeon: [name], DO;  [facility];  Service: Endoscopy;  Laterality: N/A;  3:00pm   colonscopy  2005   Dr. Katrinka Blazing    ENTEROSCOPY N/A 07/25/2023   Procedure: ENTEROSCOPY;  Surgeon: [name], DO;  [facility];  Service: Endoscopy;  Laterality: N/A;  1:45 pm, asa 2   ESOPHAGOGASTRODUODENOSCOPY  12/2009   chronic gastritis   ESOPHAGOGASTRODUODENOSCOPY N/A 05/17/2017   Dr. Darrick Penna; gastritis, normal small bowel biopsies, multiple gastric polyps. fundic gland polyps   ESOPHAGOGASTRODUODENOSCOPY (EGD) WITH PROPOFOL N/A 04/21/2020   Rourk: multiple gastric polyps, biopsy of one c/w fundic gland. gastric biopsy showed mild chronic gastritis but no H.pylori   ESOPHAGOGASTRODUODENOSCOPY (EGD) WITH PROPOFOL N/A 04/20/2023   Procedure: ESOPHAGOGASTRODUODENOSCOPY (EGD) WITH PROPOFOL;  Surgeon: [name], DO;  [facility];  Service: Endoscopy;  Laterality: N/A;  1:00 pm, asa 3, pt knows to arrive at 6:30   GIVENS CAPSULE STUDY N/A 05/30/2017   gastritis, no source for anemia identified   left knee surgery Left 12/30/2015   PARTIAL KNEE ARTHROPLASTY Left 08/09/2017   Procedure: LEFT UNICOMPARTMENTAL KNEE;  Surgeon: Ollen Gross, MD;  Location: [facility];  Service: Orthopedics;  Laterality: Left;   POLYPECTOMY  04/21/2020   Procedure: POLYPECTOMY;  Surgeon: Corbin Ade, MD;  [facility];  Service: Endoscopy;;  gastric   Resection of left  lobe of thyroid     right carpal tunnel release     rt. knee athroscopy  2004   SAVORY DILATION N/A 05/17/2017   Procedure: SAVORY DILATION;  Surgeon: West Bali, MD;  [facility]: [facility];  Service: Endoscopy;  Laterality: N/A;   TOTAL ABDOMINAL HYSTERECTOMY  1994   UMBILICAL HERNIA REPAIR     Urethral dilation for stenosis  2009   Patient Active Problem List   Diagnosis Date Noted   Encounter for Medicare annual examination with abnormal findings 11/14/2023   Duodenal adenoma 07/04/2023   Right knee pain 06/15/2023   Left wrist pain 04/24/2023   Neuropathic pain 03/25/2023   Osteoarthritis of right knee 03/13/2023    Fatigue 11/08/2022   Chronic pain of right knee 09/09/2022   Lower leg edema 06/12/2022   Prediabetes 01/12/2022   Radiculopathy due to cervical spondylosis at single level 12/16/2021   Neck pain, bilateral 07/05/2021   Unsteady gait when walking 06/02/2021   Numbness and tingling in both hands 06/02/2021   Thumb pain, left 04/22/2021   Flushing 10/19/2020   Depression, major, single episode, in partial remission (HCC) 06/21/2020   Hot flashes 03/11/2020   Vitamin D deficiency 03/11/2020   Sensation of feeling hot 03/05/2020   Lumbar spine painful on movement 12/02/2019   Overweight (BMI 25.0-29.9) 06/17/2019   History of arthroplasty of left knee 01/04/2018   OA (osteoarthritis) of knee 08/09/2017   Dyspepsia 03/02/2017   Headache 01/06/2017   Constipation 02/05/2016   Allergic rhinitis 10/08/2014   GAD (generalized anxiety disorder) 09/23/2014   Back pain with radiculopathy 04/14/2014   Right shoulder pain 12/19/2011   Postsurgical hypothyroidism 06/22/2010   Normocytic anemia 08/04/2009   Gastroesophageal reflux disease 04/20/2009   Mixed hyperlipidemia 05/16/2008   Essential hypertension 05/16/2008   Disorder of bone and cartilage 05/16/2008   Urinary incontinence 05/16/2008    PCP: Kerri Perches, MD  REFERRING PROVIDER: Kerri Perches, MD  REFERRING DIAG: M54.10 (ICD-10-CM) - Back pain with radiculopathy  Rationale for Evaluation and Treatment: Rehabilitation  THERAPY DIAG:  Back pain with radiculopathy  Cervicalgia  Abnormal posture  Other abnormalities of gait and mobility  Radiculopathy, lumbar region  ONSET DATE: 2024  SUBJECTIVE:                                                                                                                                                                                           SUBJECTIVE STATEMENT: 11/28/23:  Pt reports she has increased pain following lifting baskets, current pain scale 7/10.  EVAL:  Arrives to the clinic with low back pain (see below).  Denies any numbness or weakness on the legs. Condition started 2024 without apparent reason and gradually got worse. Denies any trauma. Patient went to her MD and was told she had a bulging disc and her sciatic nerve was affected. Patient had injections in the past which helped. Recently, patient was referred to outpatient PT evaluation and management.  PERTINENT HISTORY:  Partial L knee arthroplasty. Polyp on her colon (will have surgery soon)  PAIN:  Are you having pain? Yes: NPRS scale: 7/10 Pain location: R side of the low back Pain description: sharp, throbbing Aggravating factors: comes in randomly, turning R/L, standing up Relieving factors: roll on liniment  PRECAUTIONS: None  RED FLAGS: None   WEIGHT BEARING RESTRICTIONS: No  FALLS:  Has patient fallen in last 6 months? No  LIVING ENVIRONMENT: Lives with: lives with their spouse Lives in: House/apartment Stairs: Yes: External: 1 steps; none Has following equipment at home: Single point cane and Walker - 4 wheeled  OCCUPATION: retired  PLOF: Independent and Independent with basic ADLs  PATIENT GOALS: "I want to get rid of this pain on my back"  NEXT MD VISIT: March 2024  OBJECTIVE:  Note: Objective measures were completed at Evaluation unless otherwise noted.  DIAGNOSTIC FINDINGS:  11/01/23 CT ABDOMEN AND PELVIS WITH CONTRAST   TECHNIQUE: Multidetector CT imaging of the abdomen and pelvis was performed using the standard protocol following bolus administration of intravenous contrast.   RADIATION DOSE REDUCTION: This exam was performed according to the departmental dose-optimization program which includes automated exposure control, adjustment of the mA and/or kV according to patient size and/or use of iterative reconstruction technique.   CONTRAST:  OMNIPAQUE IOHEXOL 300 MG/ML  SOLN   COMPARISON:  04/25/2022   FINDINGS: Lower chest: Heart is  normal size. Minimal calcified plaque over the left anterior descending coronary artery. Lung bases are clear.   Hepatobiliary: Several liver hypodensities with the largest over the right lobe measuring 3.5 cm compatible with cysts and unchanged. Gallbladder and biliary tree are normal.   Pancreas: Normal.   Spleen: Normal.   Adrenals/Urinary Tract: Adrenal glands are normal. Kidneys normal in size without hydronephrosis or nephrolithiasis. Kidneys are otherwise unchanged. Ureters and bladder are normal.   Stomach/Bowel: The stomach and small bowel are normal. Appendix not visualized. Very minimal diverticulosis of the colon as the colon is otherwise unremarkable.   Vascular/Lymphatic: Abdominal aorta is normal caliber. Remaining vascular structures are normal. No adenopathy.   Reproductive: Status post hysterectomy. No adnexal masses.   Other: No free fluid or focal inflammatory change.   Musculoskeletal: No focal abnormality.   IMPRESSION: 1. No acute findings in the abdomen/pelvis. 2. Stable liver cysts. 3. Very minimal diverticulosis of the colon. 4. Aortic atherosclerosis.   Aortic Atherosclerosis (ICD10-I70.0).  PATIENT SURVEYS:  Modified Oswestry 21/50 = 42%   COGNITION: Overall cognitive status: Within functional limits for tasks assessed     SENSATION: WFL  MUSCLE LENGTH: Hamstrings: moderate restriction on B Thomas test: moderate restriction on B Piriformis: moderate restriction on B  POSTURE: rounded shoulders, forward head, and anterior pelvic tilt  PALPATION: Grade 1 tenderness on the R paraspinals on the low back  LUMBAR ROM:   AROM eval  Flexion 75%  Extension 100%  Right lateral flexion   Left lateral flexion   Right rotation 100%  Left rotation 100%   (Blank rows = not tested)  LOWER EXTREMITY ROM:     Active  Right eval Left eval  Hip flexion [facility]  Hip extension    Hip abduction [facility]  Hip adduction    Hip internal  rotation    Hip external rotation    Knee flexion [facility] WFL  Knee extension [facility]  Ankle dorsiflexion [facility] WFL  Ankle plantarflexion [facility] WFL  Ankle inversion    Ankle eversion     (Blank rows = not tested)  LOWER EXTREMITY MMT:    MMT Right eval Left eval  Hip flexion 4- 4-  Hip extension 3+ 3+  Hip abduction 4- 4-  Hip adduction    Hip internal rotation    Hip external rotation    Knee flexion 4 4  Knee extension 4 4  Ankle dorsiflexion 4 4  Ankle plantarflexion 4 4  Ankle inversion    Ankle eversion     (Blank rows = not tested)  LUMBAR SPECIAL TESTS:  Straight leg raise test: aggravates pain on the low back and FABER test: L FABER aggravates pain on the R lumbar area  FUNCTIONAL TESTS:  5 times sit to stand: 13.40 sec 2 minute walk test: 535 ft  GAIT: Distance walked: 535 ft Assistive device utilized: None Level of assistance: Complete Independence Comments: done during , no gait deviations seen  TREATMENT DATE:  11/28/23: Reviewed goals Educated importance of HEP complaince Educated log rolling bed mobility  Supine: Deep breathing to calm CNS 1' Deep breathing paired with TrA activation (with exhalation) Decompression 2-5 5x  Bridge 10x 5" Marching 10x5" Clam with RTB 10x 5"  11/23/23 Evaluation and patient education done                                                                                                                                 PATIENT EDUCATION:  Education details: Educated on the pathoanatomy of low back pain. Educated on the goals and course of rehab.  Person educated: Patient Education method: Explanation Education comprehension: verbalized understanding  HOME EXERCISE PROGRAM: None provided to date. Access Code: [email or web address] URL: [email or web address] Date: 11/28/2023 Prepared by: Becky Sax  Exercises - Supine Bridge  - 2 x daily - 7 x weekly - 1 sets - 10 reps - Supine March  - 2 x daily - 7 x  weekly - 1 sets - 10 reps - Hooklying Isometric Clamshell  - 2 x daily - 7 x weekly - 1 sets - 10 reps  ASSESSMENT:  CLINICAL IMPRESSION: 11/28/23:  Reviewed goals, educated importance of HEP compliance for maximal benefits.  Educated on log rolling.  Session focus with core and proximal strengthening that was tolerated well.  Established HEP with copy given and verbalized understanding.  Cueing required for proper abdominal TrA, encouraged to pair with exhalation to reduce holding breath.  No reports of pain at EOS.  EVAL: Patient is a 73 y.o. female who was seen today for physical therapy evaluation and treatment for back pain with radiculopathy. Patient's condition is further defined by difficulty with  sit-to-stand and bending over due to pain, weakness, and decreased soft tissue extensibility. Skilled PT is required to address the impairments and functional limitations listed below.    OBJECTIVE IMPAIRMENTS: decreased strength, impaired flexibility, and pain.   ACTIVITY LIMITATIONS: carrying, lifting, bending, standing, squatting, and transfers  PARTICIPATION LIMITATIONS: meal prep, cleaning, laundry, driving, shopping, community activity, and yard work  PERSONAL FACTORS: Age and Time since onset of injury/illness/exacerbation are also affecting patient's functional outcome.   REHAB POTENTIAL: Good  CLINICAL DECISION MAKING: Stable/uncomplicated  EVALUATION COMPLEXITY: Low   GOALS: Goals reviewed with patient? Yes  SHORT TERM GOALS: Target date: 12/07/23  Pt will demonstrate indep in HEP to facilitate carry-over of skilled services and improve functional outcomes Goal status: INITIAL  LONG TERM GOALS: Target date: 12/21/2023  Pt will demonstrate a decrease in modified ODI score by 13-14% to demonstrate significant improvement in ADLs Baseline: 42% Goal status: INITIAL  2.  Pt will demonstrate increase in lumbar flex ROM by 25%  to facilitate ease in ADLs Baseline: 75% Goal  status: INITIAL  3.  Pt will decrease 5TSTS by at least 3 seconds in order to demonstrate clinically significant improvement in LE strength   Baseline: 13.40 sec Goal status: INITIAL  4.  Pt will demonstrate increase in LE strength to 4/5 to facilitate ease and safety in ambulation Baseline: 3+/5 Goal status: INITIAL  PLAN:  PT FREQUENCY: 2x/week  PT DURATION: 4 weeks  PLANNED INTERVENTIONS: [id number]- PT Re-evaluation, 97110-Therapeutic exercises, 97530- Therapeutic activity, [id number]- Neuromuscular re-education, 97535- Self Care, [id number]- Manual therapy, and Patient/Family education.  PLAN FOR NEXT SESSION: Review HEP compliance. Begin core and hip strengthening/mobility activities.  Becky Sax, LPTA/CLT; [place] [phone]  11/28/2023, 4:58 PM

## 2023-11-30 ENCOUNTER — Ambulatory Visit (HOSPITAL_COMMUNITY): Payer: Medicare Other

## 2023-11-30 ENCOUNTER — Encounter (HOSPITAL_COMMUNITY): Payer: Self-pay

## 2023-11-30 DIAGNOSIS — R2689 Other abnormalities of gait and mobility: Secondary | ICD-10-CM | POA: Diagnosis not present

## 2023-11-30 DIAGNOSIS — M542 Cervicalgia: Secondary | ICD-10-CM | POA: Diagnosis not present

## 2023-11-30 DIAGNOSIS — M5410 Radiculopathy, site unspecified: Secondary | ICD-10-CM

## 2023-11-30 DIAGNOSIS — R293 Abnormal posture: Secondary | ICD-10-CM | POA: Diagnosis not present

## 2023-11-30 DIAGNOSIS — M5416 Radiculopathy, lumbar region: Secondary | ICD-10-CM

## 2023-11-30 NOTE — Therapy (Signed)
OUTPATIENT PHYSICAL THERAPY THORACOLUMBAR TREATMENT   Patient Name: Laura [name] MRN: [id number] DOB:1951-02-09, 73 y.o., female Today's Date: 11/30/2023  END OF SESSION:  PT End of Session - 11/30/23 0759     Visit Number 3    Number of Visits 8    Date for PT Re-Evaluation 12/21/23    Authorization Type [employer] Medicare (requested 8 visits)    Authorization Time Period uhc approved 8 visits from 11/23/2023-12/21/2023    Authorization - Visit Number 2    Authorization - Number of Visits 8    Progress Note Due on Visit 8    PT Start Time 0801    PT Stop Time 0844    PT Time Calculation (min) 43 min    Activity Tolerance Patient tolerated treatment well    Behavior During Therapy [facility] for tasks assessed/performed              Past Medical History:  Diagnosis Date   Anxiety    At low risk for fall 08/15/2018   Cystitis    Depression    Gastritis    GERD (gastroesophageal reflux disease)    Hashimoto's thyroiditis    Hx    Hyperlipidemia    Hypertension    Hypothyroidism    Normocytic anemia 2009 Hb 9.9-11.1   Osteopenia    Shoulder pain    Urinary incontinence    Past Surgical History:  Procedure Laterality Date   Bilateral tubal ligation     BIOPSY  04/21/2020   Procedure: BIOPSY;  Surgeon: Corbin Ade, MD;  [facility];  Service: Endoscopy;;   BIOPSY  04/20/2023   Procedure: BIOPSY;  Surgeon: [name], DO;  [facility];  Service: Endoscopy;;   BREAST EXCISIONAL BIOPSY  2010   Excisional biopsy of benign left breast mass -lopoma    COLONOSCOPY  08/26/2011   SLF: 1. Internal hemorrhoids   COLONOSCOPY N/A 10/06/2015   NWG:NFAO fissure or internal hemorrhoids/mild sigmoid colitis. benign colonic path    COLONOSCOPY WITH PROPOFOL N/A 03/15/2021   Procedure: COLONOSCOPY WITH PROPOFOL;  Surgeon: [name], DO;  [facility];  Service: Endoscopy;  Laterality: N/A;  3:00pm   colonscopy  2005   Dr. Katrinka Blazing    ENTEROSCOPY N/A 07/25/2023   Procedure: ENTEROSCOPY;  Surgeon: [name], DO;  [facility];  Service: Endoscopy;  Laterality: N/A;  1:45 pm, asa 2   ESOPHAGOGASTRODUODENOSCOPY  12/2009   chronic gastritis   ESOPHAGOGASTRODUODENOSCOPY N/A 05/17/2017   Dr. Darrick Penna; gastritis, normal small bowel biopsies, multiple gastric polyps. fundic gland polyps   ESOPHAGOGASTRODUODENOSCOPY (EGD) WITH PROPOFOL N/A 04/21/2020   Rourk: multiple gastric polyps, biopsy of one c/w fundic gland. gastric biopsy showed mild chronic gastritis but no H.pylori   ESOPHAGOGASTRODUODENOSCOPY (EGD) WITH PROPOFOL N/A 04/20/2023   Procedure: ESOPHAGOGASTRODUODENOSCOPY (EGD) WITH PROPOFOL;  Surgeon: [name], DO;  [facility];  Service: Endoscopy;  Laterality: N/A;  1:00 pm, asa 3, pt knows to arrive at 6:30   GIVENS CAPSULE STUDY N/A 05/30/2017   gastritis, no source for anemia identified   left knee surgery Left 12/30/2015   PARTIAL KNEE ARTHROPLASTY Left 08/09/2017   Procedure: LEFT UNICOMPARTMENTAL KNEE;  Surgeon: Ollen Gross, MD;  Location: [facility];  Service: Orthopedics;  Laterality: Left;   POLYPECTOMY  04/21/2020   Procedure: POLYPECTOMY;  Surgeon: Corbin Ade, MD;  [facility];  Service: Endoscopy;;  gastric   Resection of left  lobe of thyroid     right carpal tunnel release     rt. knee athroscopy  2004   SAVORY DILATION N/A 05/17/2017   Procedure: SAVORY DILATION;  Surgeon: West Bali, MD;  [facility];  Service: Endoscopy;  Laterality: N/A;   TOTAL ABDOMINAL HYSTERECTOMY  1994   UMBILICAL HERNIA REPAIR     Urethral dilation for stenosis  2009   Patient Active Problem List   Diagnosis Date Noted   Encounter for Medicare annual examination with abnormal findings 11/14/2023   Duodenal adenoma 07/04/2023   Right knee pain 06/15/2023   Left wrist pain 04/24/2023   Neuropathic pain 03/25/2023   Osteoarthritis of right knee 03/13/2023    Fatigue 11/08/2022   Chronic pain of right knee 09/09/2022   Lower leg edema 06/12/2022   Prediabetes 01/12/2022   Radiculopathy due to cervical spondylosis at single level 12/16/2021   Neck pain, bilateral 07/05/2021   Unsteady gait when walking 06/02/2021   Numbness and tingling in both hands 06/02/2021   Thumb pain, left 04/22/2021   Flushing 10/19/2020   Depression, major, single episode, in partial remission (HCC) 06/21/2020   Hot flashes 03/11/2020   Vitamin D deficiency 03/11/2020   Sensation of feeling hot 03/05/2020   Lumbar spine painful on movement 12/02/2019   Overweight (BMI 25.0-29.9) 06/17/2019   History of arthroplasty of left knee 01/04/2018   OA (osteoarthritis) of knee 08/09/2017   Dyspepsia 03/02/2017   Headache 01/06/2017   Constipation 02/05/2016   Allergic rhinitis 10/08/2014   GAD (generalized anxiety disorder) 09/23/2014   Back pain with radiculopathy 04/14/2014   Right shoulder pain 12/19/2011   Postsurgical hypothyroidism 06/22/2010   Normocytic anemia 08/04/2009   Gastroesophageal reflux disease 04/20/2009   Mixed hyperlipidemia 05/16/2008   Essential hypertension 05/16/2008   Disorder of bone and cartilage 05/16/2008   Urinary incontinence 05/16/2008    PCP: Kerri Perches, MD  REFERRING PROVIDER: Kerri Perches, MD  REFERRING DIAG: M54.10 (ICD-10-CM) - Back pain with radiculopathy  Rationale for Evaluation and Treatment: Rehabilitation  THERAPY DIAG:  Back pain with radiculopathy  Cervicalgia  Abnormal posture  Other abnormalities of gait and mobility  Radiculopathy, lumbar region  ONSET DATE: 2024  SUBJECTIVE:                                                                                                                                                                                           SUBJECTIVE STATEMENT: 11/30/23:  Pt stated she feels a lot better following last session.  Stated more aggravation vs pain on Rt  side of lower back, pain scale 3/10.  EVAL: Arrives to the clinic with low back pain (see below). Denies any numbness or weakness on the legs. Condition started 2024 without apparent reason and gradually got worse. Denies any trauma. Patient went to her MD and was told she had a bulging disc and her sciatic nerve was affected. Patient had injections in the past which helped. Recently, patient was referred to outpatient PT evaluation and management.  PERTINENT HISTORY:  Partial L knee arthroplasty. Polyp on her colon (will have surgery soon)  PAIN:  Are you having pain? Yes: NPRS scale: 7/10 Pain location: R side of the low back Pain description: sharp, throbbing Aggravating factors: comes in randomly, turning R/L, standing up Relieving factors: roll on liniment  PRECAUTIONS: None  RED FLAGS: None   WEIGHT BEARING RESTRICTIONS: No  FALLS:  Has patient fallen in last 6 months? No  LIVING ENVIRONMENT: Lives with: lives with their spouse Lives in: House/apartment Stairs: Yes: External: 1 steps; none Has following equipment at home: Single point cane and Walker - 4 wheeled  OCCUPATION: retired  PLOF: Independent and Independent with basic ADLs  PATIENT GOALS: "I want to get rid of this pain on my back"  NEXT MD VISIT: March 2024  OBJECTIVE:  Note: Objective measures were completed at Evaluation unless otherwise noted.  DIAGNOSTIC FINDINGS:  11/01/23 CT ABDOMEN AND PELVIS WITH CONTRAST   TECHNIQUE: Multidetector CT imaging of the abdomen and pelvis was performed using the standard protocol following bolus administration of intravenous contrast.   RADIATION DOSE REDUCTION: This exam was performed according to the departmental dose-optimization program which includes automated exposure control, adjustment of the mA and/or kV according to patient size and/or use of iterative reconstruction technique.   CONTRAST:  OMNIPAQUE IOHEXOL 300 MG/ML  SOLN   COMPARISON:   04/25/2022   FINDINGS: Lower chest: Heart is normal size. Minimal calcified plaque over the left anterior descending coronary artery. Lung bases are clear.   Hepatobiliary: Several liver hypodensities with the largest over the right lobe measuring 3.5 cm compatible with cysts and unchanged. Gallbladder and biliary tree are normal.   Pancreas: Normal.   Spleen: Normal.   Adrenals/Urinary Tract: Adrenal glands are normal. Kidneys normal in size without hydronephrosis or nephrolithiasis. Kidneys are otherwise unchanged. Ureters and bladder are normal.   Stomach/Bowel: The stomach and small bowel are normal. Appendix not visualized. Very minimal diverticulosis of the colon as the colon is otherwise unremarkable.   Vascular/Lymphatic: Abdominal aorta is normal caliber. Remaining vascular structures are normal. No adenopathy.   Reproductive: Status post hysterectomy. No adnexal masses.   Other: No free fluid or focal inflammatory change.   Musculoskeletal: No focal abnormality.   IMPRESSION: 1. No acute findings in the abdomen/pelvis. 2. Stable liver cysts. 3. Very minimal diverticulosis of the colon. 4. Aortic atherosclerosis.   Aortic Atherosclerosis (ICD10-I70.0).  PATIENT SURVEYS:  Modified Oswestry 21/50 = 42%   COGNITION: Overall cognitive status: Within functional limits for tasks assessed     SENSATION: WFL  MUSCLE LENGTH: Hamstrings: moderate restriction on B Thomas test: moderate restriction on B Piriformis: moderate restriction on B  POSTURE: rounded shoulders, forward head, and anterior pelvic tilt  PALPATION: Grade 1 tenderness on the R paraspinals on the low back  LUMBAR ROM:   AROM eval  Flexion 75%  Extension 100%  Right lateral flexion   Left lateral flexion   Right rotation 100%  Left rotation 100%   (Blank rows = not tested)  LOWER EXTREMITY ROM:  Active  Right eval Left eval  Hip flexion [facility]  Hip extension    Hip  abduction [facility]  Hip adduction    Hip internal rotation    Hip external rotation    Knee flexion [facility] WFL  Knee extension [facility]  Ankle dorsiflexion [facility] WFL  Ankle plantarflexion [facility] WFL  Ankle inversion    Ankle eversion     (Blank rows = not tested)  LOWER EXTREMITY MMT:    MMT Right eval Left eval  Hip flexion 4- 4-  Hip extension 3+ 3+  Hip abduction 4- 4-  Hip adduction    Hip internal rotation    Hip external rotation    Knee flexion 4 4  Knee extension 4 4  Ankle dorsiflexion 4 4  Ankle plantarflexion 4 4  Ankle inversion    Ankle eversion     (Blank rows = not tested)  LUMBAR SPECIAL TESTS:  Straight leg raise test: aggravates pain on the low back and FABER test: L FABER aggravates pain on the R lumbar area  FUNCTIONAL TESTS:  5 times sit to stand: 13.40 sec 2 minute walk test: 535 ft  GAIT: Distance walked: 535 ft Assistive device utilized: None Level of assistance: Complete Independence Comments: done during , no gait deviations seen  TREATMENT DATE:  11/30/23: Supine: LTR 5x 10" Bridge 10x 5" SKTC 2x 30" Hamstring stretch 2x 30" hands behind knee Piriformis st with towel 2x 30"  Sidelying: clam with RTB 10x 5"  Seated: educated on lumbar support for improved seated posture STS 10x eccentric control  Standing: lumbar extension 10x   11/28/23: Reviewed goals Educated importance of HEP complaince Educated log rolling bed mobility  Supine: Deep breathing to calm CNS 1' Deep breathing paired with TrA activation (with exhalation) Decompression 2-5 5x  Bridge 10x 5" Marching 10x5" Clam with RTB 10x 5"  11/23/23 Evaluation and patient education done                                                                                                                                 PATIENT EDUCATION:  Education details: Educated on the pathoanatomy of low back pain. Educated on the goals and course of rehab.  Person educated:  Patient Education method: Explanation Education comprehension: verbalized understanding  HOME EXERCISE PROGRAM: None provided to date. Access Code: [email or web address] URL: [email or web address] Date: 11/28/2023 Prepared by: Becky Sax  Exercises - Supine Bridge  - 2 x daily - 7 x weekly - 1 sets - 10 reps - Supine March  - 2 x daily - 7 x weekly - 1 sets - 10 reps - Hooklying Isometric Clamshell  - 2 x daily - 7 x weekly - 1 sets - 10 reps -Decompression  Access Code: [email or web address] URL: [email or web address] Date: 11/30/2023 Prepared by: Becky Sax  Exercises - Supine Lower Trunk Rotation  - 1 x daily - 7 x weekly - 1 sets -  5 reps - 10" hold - Supine Hamstring Stretch  - 2 x daily - 7 x weekly - 1 sets - 3 reps - 30" hold - Hooklying Single Knee to Chest Stretch  - 1 x daily - 7 x weekly - 3 sets - 10 reps - Clam with Resistance  - 2 x daily - 7 x weekly - 1 sets - 10 reps - 5" hold - Sit to Stand  - 2 x daily - 7 x weekly - 1 sets - 10 reps - Standing Lumbar Extension  - 2 x daily - 7 x weekly - 1 sets - 10 reps  ASSESSMENT:  CLINICAL IMPRESSION: 11/30/23:  Continued session focus with core and proximal strengthening with additional stretches to address lumbar and LE mobility.  Pt tolerated well to session with increased challenge for core and gluteal strengthening with reports of pain resolved at EOS.  Min cueing for core stability with mat activities and encouraged to hold stretches for longer duration for maximal benefits.  Pt will benefits from additional functional strengthening and educated on proper lifting in future apts.  EVAL: Patient is a 73 y.o. female who was seen today for physical therapy evaluation and treatment for back pain with radiculopathy. Patient's condition is further defined by difficulty with sit-to-stand and bending over due to pain, weakness, and decreased soft tissue extensibility. Skilled PT is required to address the  impairments and functional limitations listed below.    OBJECTIVE IMPAIRMENTS: decreased strength, impaired flexibility, and pain.   ACTIVITY LIMITATIONS: carrying, lifting, bending, standing, squatting, and transfers  PARTICIPATION LIMITATIONS: meal prep, cleaning, laundry, driving, shopping, community activity, and yard work  PERSONAL FACTORS: Age and Time since onset of injury/illness/exacerbation are also affecting patient's functional outcome.   REHAB POTENTIAL: Good  CLINICAL DECISION MAKING: Stable/uncomplicated  EVALUATION COMPLEXITY: Low   GOALS: Goals reviewed with patient? Yes  SHORT TERM GOALS: Target date: 12/07/23  Pt will demonstrate indep in HEP to facilitate carry-over of skilled services and improve functional outcomes Goal status: INITIAL  LONG TERM GOALS: Target date: 12/21/2023  Pt will demonstrate a decrease in modified ODI score by 13-14% to demonstrate significant improvement in ADLs Baseline: 42% Goal status: INITIAL  2.  Pt will demonstrate increase in lumbar flex ROM by 25%  to facilitate ease in ADLs Baseline: 75% Goal status: INITIAL  3.  Pt will decrease 5TSTS by at least 3 seconds in order to demonstrate clinically significant improvement in LE strength   Baseline: 13.40 sec Goal status: INITIAL  4.  Pt will demonstrate increase in LE strength to 4/5 to facilitate ease and safety in ambulation Baseline: 3+/5 Goal status: INITIAL  PLAN:  PT FREQUENCY: 2x/week  PT DURATION: 4 weeks  PLANNED INTERVENTIONS: 97164- PT Re-evaluation, 97110-Therapeutic exercises, 97530- Therapeutic activity, [id number]- Neuromuscular re-education, 97535- Self Care, [id number]- Manual therapy, and Patient/Family education.  PLAN FOR NEXT SESSION: Begin core and hip strengthening/mobility activities.  Educate proper lifting mechanics.  Becky Sax, LPTA/CLT; [place] [phone]  11/30/2023, 9:03 AM

## 2023-12-01 ENCOUNTER — Other Ambulatory Visit: Payer: Self-pay | Admitting: Family Medicine

## 2023-12-05 ENCOUNTER — Ambulatory Visit (HOSPITAL_COMMUNITY): Payer: Medicare Other | Attending: Family Medicine

## 2023-12-05 DIAGNOSIS — M5410 Radiculopathy, site unspecified: Secondary | ICD-10-CM | POA: Diagnosis not present

## 2023-12-05 DIAGNOSIS — R2689 Other abnormalities of gait and mobility: Secondary | ICD-10-CM | POA: Diagnosis not present

## 2023-12-05 DIAGNOSIS — R293 Abnormal posture: Secondary | ICD-10-CM

## 2023-12-05 NOTE — Therapy (Addendum)
 OUTPATIENT PHYSICAL THERAPY THORACOLUMBAR TREATMENT   Patient Name: Laura [name] MRN: [id number] DOB:01-08-1951, 73 y.o., female Today's Date: 12/05/2023  END OF SESSION:   12/05/23 1023  PT Visits / Re-Eval  Visit Number 4  Number of Visits 8  Date for PT Re-Evaluation 12/21/23  Authorization  Authorization Type [employer] Medicare  Authorization Time Period uhc approved 8 visits from 11/23/2023-12/21/2023  Authorization - Visit Number 3  Authorization - Number of Visits 8  PT Time Calculation  PT Start Time 1015  PT Stop Time 1055  PT Time Calculation (min) 40 min  PT - End of Session  Activity Tolerance Patient tolerated treatment well  Behavior During Therapy [facility] for tasks assessed/performed      Past Medical History:  Diagnosis Date   Anxiety    At low risk for fall 08/15/2018   Cystitis    Depression    Gastritis    GERD (gastroesophageal reflux disease)    Hashimoto's thyroiditis    Hx    Hyperlipidemia    Hypertension    Hypothyroidism    Normocytic anemia 2009 Hb 9.9-11.1   Osteopenia    Shoulder pain    Urinary incontinence    Past Surgical History:  Procedure Laterality Date   Bilateral tubal ligation     BIOPSY  04/21/2020   Procedure: BIOPSY;  Surgeon: Shaaron Lamar HERO, MD;  [facility];  Service: Endoscopy;;   BIOPSY  04/20/2023   Procedure: BIOPSY;  Surgeon: Cindie Carlin POUR, DO;  [facility];  Service: Endoscopy;;   BREAST EXCISIONAL BIOPSY  2010   Excisional biopsy of benign left breast mass -lopoma    COLONOSCOPY  08/26/2011   SLF: 1. Internal hemorrhoids   COLONOSCOPY N/A 10/06/2015   DOQ:jwjo fissure or internal hemorrhoids/mild sigmoid colitis. benign colonic path    COLONOSCOPY WITH PROPOFOL  N/A 03/15/2021   Procedure: COLONOSCOPY WITH PROPOFOL ;  Surgeon: Cindie Carlin POUR, DO;  [facility];  Service: Endoscopy;  Laterality: N/A;  3:00pm   colonscopy  2005   Dr. Claudene   ENTEROSCOPY N/A 07/25/2023    Procedure: ENTEROSCOPY;  Surgeon: Cindie Carlin POUR, DO;  [facility];  Service: Endoscopy;  Laterality: N/A;  1:45 pm, asa 2   ESOPHAGOGASTRODUODENOSCOPY  12/2009   chronic gastritis   ESOPHAGOGASTRODUODENOSCOPY N/A 05/17/2017   Dr. Harvey; gastritis, normal small bowel biopsies, multiple gastric polyps. fundic gland polyps   ESOPHAGOGASTRODUODENOSCOPY (EGD) WITH PROPOFOL  N/A 04/21/2020   Rourk: multiple gastric polyps, biopsy of one c/w fundic gland. gastric biopsy showed mild chronic gastritis but no H.pylori   ESOPHAGOGASTRODUODENOSCOPY (EGD) WITH PROPOFOL  N/A 04/20/2023   Procedure: ESOPHAGOGASTRODUODENOSCOPY (EGD) WITH PROPOFOL ;  Surgeon: Cindie Carlin POUR, DO;  [facility];  Service: Endoscopy;  Laterality: N/A;  1:00 pm, asa 3, pt knows to arrive at 6:30   GIVENS CAPSULE STUDY N/A 05/30/2017   gastritis, no source for anemia identified   left knee surgery Left 12/30/2015   PARTIAL KNEE ARTHROPLASTY Left 08/09/2017   Procedure: LEFT UNICOMPARTMENTAL KNEE;  Surgeon: Melodi Lerner, MD;  Location: [facility];  Service: Orthopedics;  Laterality: Left;   POLYPECTOMY  04/21/2020   Procedure: POLYPECTOMY;  Surgeon: Shaaron Lamar HERO, MD;  [facility];  Service: Endoscopy;;  gastric   Resection of left lobe of thyroid      right carpal tunnel release     rt. knee athroscopy  2004   SAVORY DILATION N/A 05/17/2017   Procedure: SAVORY DILATION;  Surgeon: Harvey Margo CROME, MD;  [facility];  Service: Endoscopy;  Laterality: N/A;   TOTAL ABDOMINAL HYSTERECTOMY  1994   UMBILICAL HERNIA REPAIR     Urethral dilation for stenosis  2009   Patient Active Problem List   Diagnosis Date Noted   Encounter for Medicare annual examination with abnormal findings 11/14/2023   Duodenal adenoma 07/04/2023   Right knee pain 06/15/2023   Left wrist pain 04/24/2023   Neuropathic pain 03/25/2023   Osteoarthritis of right knee 03/13/2023   Fatigue 11/08/2022   Chronic  pain of right knee 09/09/2022   Lower leg edema 06/12/2022   Prediabetes 01/12/2022   Radiculopathy due to cervical spondylosis at single level 12/16/2021   Neck pain, bilateral 07/05/2021   Unsteady gait when walking 06/02/2021   Numbness and tingling in both hands 06/02/2021   Thumb pain, left 04/22/2021   Flushing 10/19/2020   Depression, major, single episode, in partial remission (HCC) 06/21/2020   Hot flashes 03/11/2020   Vitamin D  deficiency 03/11/2020   Sensation of feeling hot 03/05/2020   Lumbar spine painful on movement 12/02/2019   Overweight (BMI 25.0-29.9) 06/17/2019   History of arthroplasty of left knee 01/04/2018   OA (osteoarthritis) of knee 08/09/2017   Dyspepsia 03/02/2017   Headache 01/06/2017   Constipation 02/05/2016   Allergic rhinitis 10/08/2014   GAD (generalized anxiety disorder) 09/23/2014   Back pain with radiculopathy 04/14/2014   Right shoulder pain 12/19/2011   Postsurgical hypothyroidism 06/22/2010   Normocytic anemia 08/04/2009   Gastroesophageal reflux disease 04/20/2009   Mixed hyperlipidemia 05/16/2008   Essential hypertension 05/16/2008   Disorder of bone and cartilage 05/16/2008   Urinary incontinence 05/16/2008    PCP: [name], MD  REFERRING PROVIDER: [name], MD  REFERRING DIAG: M54.10 (ICD-10-CM) - Back pain with radiculopathy  Rationale for Evaluation and Treatment: Rehabilitation  THERAPY DIAG:  No diagnosis found.  ONSET DATE: 2024  SUBJECTIVE:                                                                                                                                                                                           SUBJECTIVE STATEMENT: 12/05/23:  Currently reports of low back pain = 7/10. Patient states that she picked up her laundry basket 2 days ago which aggravated her back. Patient states that pain is mostly localized on the low back.  EVAL: Arrives to the clinic with low back pain  (see below). Denies any numbness or weakness on the legs. Condition started 2024 without apparent reason and gradually got worse. Denies any trauma. Patient went to her MD and  was told she had a bulging disc and her sciatic nerve was affected. Patient had injections in the past which helped. Recently, patient was referred to outpatient PT evaluation and management.  PERTINENT HISTORY:  Partial L knee arthroplasty. Polyp on her colon (will have surgery soon)  PAIN:  Are you having pain? Yes: NPRS scale: 7/10 Pain location: R side of the low back Pain description: sharp, throbbing Aggravating factors: comes in randomly, turning R/L, standing up Relieving factors: roll on liniment  PRECAUTIONS: None  RED FLAGS: None   WEIGHT BEARING RESTRICTIONS: No  FALLS:  Has patient fallen in last 6 months? No  LIVING ENVIRONMENT: Lives with: lives with their spouse Lives in: House/apartment Stairs: Yes: External: 1 steps; none Has following equipment at home: Single point cane and Walker - 4 wheeled  OCCUPATION: retired  PLOF: Independent and Independent with basic ADLs  PATIENT GOALS: I want to get rid of this pain on my back  NEXT MD VISIT: March 2024  OBJECTIVE:  Note: Objective measures were completed at Evaluation unless otherwise noted.  DIAGNOSTIC FINDINGS:  11/01/23 CT ABDOMEN AND PELVIS WITH CONTRAST   TECHNIQUE: Multidetector CT imaging of the abdomen and pelvis was performed using the standard protocol following bolus administration of intravenous contrast.   RADIATION DOSE REDUCTION: This exam was performed according to the departmental dose-optimization program which includes automated exposure control, adjustment of the mA and/or kV according to patient size and/or use of iterative reconstruction technique.   CONTRAST:  OMNIPAQUE  IOHEXOL  300 MG/ML  SOLN   COMPARISON:  04/25/2022   FINDINGS: Lower chest: Heart is normal size. Minimal calcified plaque over  the left anterior descending coronary artery. Lung bases are clear.   Hepatobiliary: Several liver hypodensities with the largest over the right lobe measuring 3.5 cm compatible with cysts and unchanged. Gallbladder and biliary tree are normal.   Pancreas: Normal.   Spleen: Normal.   Adrenals/Urinary Tract: Adrenal glands are normal. Kidneys normal in size without hydronephrosis or nephrolithiasis. Kidneys are otherwise unchanged. Ureters and bladder are normal.   Stomach/Bowel: The stomach and small bowel are normal. Appendix not visualized. Very minimal diverticulosis of the colon as the colon is otherwise unremarkable.   Vascular/Lymphatic: Abdominal aorta is normal caliber. Remaining vascular structures are normal. No adenopathy.   Reproductive: Status post hysterectomy. No adnexal masses.   Other: No free fluid or focal inflammatory change.   Musculoskeletal: No focal abnormality.   IMPRESSION: 1. No acute findings in the abdomen/pelvis. 2. Stable liver cysts. 3. Very minimal diverticulosis of the colon. 4. Aortic atherosclerosis.   Aortic Atherosclerosis (ICD10-I70.0).  PATIENT SURVEYS:  Modified Oswestry 21/50 = 42%   COGNITION: Overall cognitive status: Within functional limits for tasks assessed     SENSATION: WFL  MUSCLE LENGTH: Hamstrings: moderate restriction on B Thomas test: moderate restriction on B Piriformis: moderate restriction on B  POSTURE: rounded shoulders, forward head, and anterior pelvic tilt  PALPATION: Grade 1 tenderness on the R paraspinals on the low back  LUMBAR ROM:   AROM eval  Flexion 75%  Extension 100%  Right lateral flexion   Left lateral flexion   Right rotation 100%  Left rotation 100%   (Blank rows = not tested)  LOWER EXTREMITY ROM:     Active  Right eval Left eval  Hip flexion [facility]  Hip extension    Hip abduction [facility]  Hip adduction    Hip internal rotation    Hip external rotation  Knee  flexion [facility] WFL  Knee extension [facility]  Ankle dorsiflexion [facility]  Ankle plantarflexion [facility] WFL  Ankle inversion    Ankle eversion     (Blank rows = not tested)  LOWER EXTREMITY MMT:    MMT Right eval Left eval  Hip flexion 4- 4-  Hip extension 3+ 3+  Hip abduction 4- 4-  Hip adduction    Hip internal rotation    Hip external rotation    Knee flexion 4 4  Knee extension 4 4  Ankle dorsiflexion 4 4  Ankle plantarflexion 4 4  Ankle inversion    Ankle eversion     (Blank rows = not tested)  LUMBAR SPECIAL TESTS:  Straight leg raise test: aggravates pain on the low back and FABER test: L FABER aggravates pain on the R lumbar area  FUNCTIONAL TESTS:  5 times sit to stand: 13.40 sec 2 minute walk test: 535 ft  GAIT: Distance walked: 535 ft Assistive device utilized: None Level of assistance: Complete Independence Comments: done during , no gait deviations seen  TREATMENT DATE:  12/05/23 Recumbent bike, seat 6, level 1 x 5' Seated hamstring stretch x 30 x 3 Seated hip hinges with a dowel x 10 Seated trunk forward flex/lat flex stretch with physioball x 30 x 2 Standing hip hinges with dowel x 10 x 2 Standing hip ext x 10 x 2, RTB Standing I exercises x RTB x 10 x 2 x 3   11/30/23: Supine: LTR 5x 10 Bridge 10x 5 SKTC 2x 30 Hamstring stretch 2x 30 hands behind knee Piriformis st with towel 2x 30  Sidelying: clam with RTB 10x 5  Seated: educated on lumbar support for improved seated posture STS 10x eccentric control  Standing: lumbar extension 10x   11/28/23: Reviewed goals Educated importance of HEP complaince Educated log rolling bed mobility  Supine: Deep breathing to calm CNS 1' Deep breathing paired with TrA activation (with exhalation) Decompression 2-5 5x  Bridge 10x 5 Marching 10x5 Clam with RTB 10x 5  11/23/23 Evaluation and patient education done                                                                                                                                  PATIENT EDUCATION:  Education details: Educated on the pathoanatomy of low back pain. Educated on the goals and course of rehab.  Person educated: Patient Education method: Explanation Education comprehension: verbalized understanding  HOME EXERCISE PROGRAM: Access Code: [email or web address] URL: [email or web address] 12/05/23 - Standing 'L' Stretch at Counter  - 2 x daily - 7 x weekly - 3 reps - 30 hold - Standing Hip Hinge with Dowel  - 1 x daily - 7 x weekly - 2 sets - 10 reps - Standing Hip Extension with Resistance at Ankles and Counter Support  - 1 x daily - 7 x weekly - 2 sets - 10 reps  Date: 11/28/2023 Prepared  by: Augustin Mclean  Exercises - Supine Bridge  - 2 x daily - 7 x weekly - 1 sets - 10 reps - Supine March  - 2 x daily - 7 x weekly - 1 sets - 10 reps - Hooklying Isometric Clamshell  - 2 x daily - 7 x weekly - 1 sets - 10 reps -Decompression  Access Code: [email or web address] URL: [email or web address] Date: 11/30/2023 Prepared by: Augustin Mclean  Exercises - Supine Lower Trunk Rotation  - 1 x daily - 7 x weekly - 1 sets - 5 reps - 10 hold - Supine Hamstring Stretch  - 2 x daily - 7 x weekly - 1 sets - 3 reps - 30 hold - Hooklying Single Knee to Chest Stretch  - 1 x daily - 7 x weekly - 3 sets - 10 reps - Clam with Resistance  - 2 x daily - 7 x weekly - 1 sets - 10 reps - 5 hold - Sit to Stand  - 2 x daily - 7 x weekly - 1 sets - 10 reps - Standing Lumbar Extension  - 2 x daily - 7 x weekly - 1 sets - 10 reps  ASSESSMENT:  CLINICAL IMPRESSION: 12/05/23:  Interventions today were geared towards LE and core strengthening. Tolerated all activities without worsening of symptoms. Demonstrated appropriate levels of fatigue. Provided mod amount of multimodal cueing to ensure correct execution of activity with fair carry-over especially on the hip hinges. Hip hinges done in preparation for proper lifting mechanics.  Patient denies pain on the back/legs at the end of the session. To date, skilled PT is required to address the impairments and improve function.   EVAL: Patient is a 73 y.o. female who was seen today for physical therapy evaluation and treatment for back pain with radiculopathy. Patient's condition is further defined by difficulty with sit-to-stand and bending over due to pain, weakness, and decreased soft tissue extensibility. Skilled PT is required to address the impairments and functional limitations listed below.    OBJECTIVE IMPAIRMENTS: decreased strength, impaired flexibility, and pain.   ACTIVITY LIMITATIONS: carrying, lifting, bending, standing, squatting, and transfers  PARTICIPATION LIMITATIONS: meal prep, cleaning, laundry, driving, shopping, community activity, and yard work  PERSONAL FACTORS: Age and Time since onset of injury/illness/exacerbation are also affecting patient's functional outcome.   REHAB POTENTIAL: Good  CLINICAL DECISION MAKING: Stable/uncomplicated  EVALUATION COMPLEXITY: Low   GOALS: Goals reviewed with patient? Yes  SHORT TERM GOALS: Target date: 12/07/23  Pt will demonstrate indep in HEP to facilitate carry-over of skilled services and improve functional outcomes Goal status: INITIAL  LONG TERM GOALS: Target date: 12/21/2023  Pt will demonstrate a decrease in modified ODI score by 13-14% to demonstrate significant improvement in ADLs Baseline: 42% Goal status: INITIAL  2.  Pt will demonstrate increase in lumbar flex ROM by 25%  to facilitate ease in ADLs Baseline: 75% Goal status: INITIAL  3.  Pt will decrease 5TSTS by at least 3 seconds in order to demonstrate clinically significant improvement in LE strength   Baseline: 13.40 sec Goal status: INITIAL  4.  Pt will demonstrate increase in LE strength to 4/5 to facilitate ease and safety in ambulation Baseline: 3+/5 Goal status: INITIAL  PLAN:  PT FREQUENCY: 2x/week  PT DURATION: 4  weeks  PLANNED INTERVENTIONS: [id number]- PT Re-evaluation, 97110-Therapeutic exercises, 97530- Therapeutic activity, [id number]- Neuromuscular re-education, 97535- Self Care, [id number]- Manual therapy, and Patient/Family education.  PLAN FOR NEXT SESSION: Begin core and hip strengthening/mobility activities.  Educate  proper lifting mechanics.  Vinie CROME. Denni France, PT, DPT, OCS Board-Certified Clinical Specialist in Orthopedic PT PT Compact Privilege # ([facility]): [id number] T 12/05/2023, 10:17 AM

## 2023-12-06 ENCOUNTER — Other Ambulatory Visit: Payer: Self-pay | Admitting: Family Medicine

## 2023-12-07 ENCOUNTER — Ambulatory Visit (HOSPITAL_COMMUNITY): Payer: Medicare Other

## 2023-12-07 ENCOUNTER — Encounter (HOSPITAL_COMMUNITY): Payer: Self-pay | Admitting: Gastroenterology

## 2023-12-07 DIAGNOSIS — R293 Abnormal posture: Secondary | ICD-10-CM | POA: Diagnosis not present

## 2023-12-07 DIAGNOSIS — R2689 Other abnormalities of gait and mobility: Secondary | ICD-10-CM | POA: Diagnosis not present

## 2023-12-07 DIAGNOSIS — M5410 Radiculopathy, site unspecified: Secondary | ICD-10-CM

## 2023-12-07 NOTE — Therapy (Signed)
 OUTPATIENT PHYSICAL THERAPY THORACOLUMBAR TREATMENT   Patient Name: Laura [name] MRN: [id number] DOB:[birth year], 73 y.o., female Today's Date: 12/07/2023  END OF SESSION:  PT End of Session - 12/07/23 0806     Visit Number 5    Number of Visits 8    Date for PT Re-Evaluation 12/21/23    Authorization Type [employer] Medicare    Authorization Time Period uhc approved 8 visits from 11/23/2023-12/21/2023    Authorization - Visit Number 4    Authorization - Number of Visits 8    Progress Note Due on Visit 8    PT Start Time 0800    PT Stop Time 0840    PT Time Calculation (min) 40 min    Activity Tolerance Patient tolerated treatment well    Behavior During Therapy [facility] for tasks assessed/performed            Past Medical History:  Diagnosis Date   Anxiety    At low risk for fall 08/15/2018   Cystitis    Depression    Gastritis    GERD (gastroesophageal reflux disease)    Hashimoto's thyroiditis    Hx    Hyperlipidemia    Hypertension    Hypothyroidism    Normocytic anemia 2009 Hb 9.9-11.1   Osteopenia    Shoulder pain    Urinary incontinence    Past Surgical History:  Procedure Laterality Date   Bilateral tubal ligation     BIOPSY  04/21/2020   Procedure: BIOPSY;  Surgeon: Shaaron Lamar HERO, MD;  [facility];  Service: Endoscopy;;   BIOPSY  04/20/2023   Procedure: BIOPSY;  Surgeon: Cindie Carlin POUR, DO;  [facility];  Service: Endoscopy;;   BREAST EXCISIONAL BIOPSY  2010   Excisional biopsy of benign left breast mass -lopoma    COLONOSCOPY  08/26/2011   SLF: 1. Internal hemorrhoids   COLONOSCOPY N/A 10/06/2015   DOQ:jwjo fissure or internal hemorrhoids/mild sigmoid colitis. benign colonic path    COLONOSCOPY WITH PROPOFOL  N/A 03/15/2021   Procedure: COLONOSCOPY WITH PROPOFOL ;  Surgeon: Cindie Carlin POUR, DO;  [facility];  Service: Endoscopy;  Laterality: N/A;  3:00pm   colonscopy  2005   Dr. Claudene   ENTEROSCOPY N/A 07/25/2023    Procedure: ENTEROSCOPY;  Surgeon: Cindie Carlin POUR, DO;  [facility];  Service: Endoscopy;  Laterality: N/A;  1:45 pm, asa 2   ESOPHAGOGASTRODUODENOSCOPY  12/2009   chronic gastritis   ESOPHAGOGASTRODUODENOSCOPY N/A 05/17/2017   Dr. Harvey; gastritis, normal small bowel biopsies, multiple gastric polyps. fundic gland polyps   ESOPHAGOGASTRODUODENOSCOPY (EGD) WITH PROPOFOL  N/A 04/21/2020   Rourk: multiple gastric polyps, biopsy of one c/w fundic gland. gastric biopsy showed mild chronic gastritis but no H.pylori   ESOPHAGOGASTRODUODENOSCOPY (EGD) WITH PROPOFOL  N/A 04/20/2023   Procedure: ESOPHAGOGASTRODUODENOSCOPY (EGD) WITH PROPOFOL ;  Surgeon: Cindie Carlin POUR, DO;  [facility];  Service: Endoscopy;  Laterality: N/A;  1:00 pm, asa 3, pt knows to arrive at 6:30   GIVENS CAPSULE STUDY N/A 05/30/2017   gastritis, no source for anemia identified   left knee surgery Left 12/30/2015   PARTIAL KNEE ARTHROPLASTY Left 08/09/2017   Procedure: LEFT UNICOMPARTMENTAL KNEE;  Surgeon: Melodi Lerner, MD;  Location: [facility];  Service: Orthopedics;  Laterality: Left;   POLYPECTOMY  04/21/2020   Procedure: POLYPECTOMY;  Surgeon: Shaaron Lamar HERO, MD;  [facility];  Service: Endoscopy;;  gastric   Resection of left lobe of thyroid   right carpal tunnel release     rt. knee athroscopy  2004   SAVORY DILATION N/A 05/17/2017   Procedure: SAVORY DILATION;  Surgeon: Harvey Margo CROME, MD;  [facility];  Service: Endoscopy;  Laterality: N/A;   TOTAL ABDOMINAL HYSTERECTOMY  1994   UMBILICAL HERNIA REPAIR     Urethral dilation for stenosis  2009   Patient Active Problem List   Diagnosis Date Noted   Encounter for Medicare annual examination with abnormal findings 11/14/2023   Duodenal adenoma 07/04/2023   Right knee pain 06/15/2023   Left wrist pain 04/24/2023   Neuropathic pain 03/25/2023   Osteoarthritis of right knee 03/13/2023   Fatigue 11/08/2022   Chronic  pain of right knee 09/09/2022   Lower leg edema 06/12/2022   Prediabetes 01/12/2022   Radiculopathy due to cervical spondylosis at single level 12/16/2021   Neck pain, bilateral 07/05/2021   Unsteady gait when walking 06/02/2021   Numbness and tingling in both hands 06/02/2021   Thumb pain, left 04/22/2021   Flushing 10/19/2020   Depression, major, single episode, in partial remission (HCC) 06/21/2020   Hot flashes 03/11/2020   Vitamin D  deficiency 03/11/2020   Sensation of feeling hot 03/05/2020   Lumbar spine painful on movement 12/02/2019   Overweight (BMI 25.0-29.9) 06/17/2019   History of arthroplasty of left knee 01/04/2018   OA (osteoarthritis) of knee 08/09/2017   Dyspepsia 03/02/2017   Headache 01/06/2017   Constipation 02/05/2016   Allergic rhinitis 10/08/2014   GAD (generalized anxiety disorder) 09/23/2014   Back pain with radiculopathy 04/14/2014   Right shoulder pain 12/19/2011   Postsurgical hypothyroidism 06/22/2010   Normocytic anemia 08/04/2009   Gastroesophageal reflux disease 04/20/2009   Mixed hyperlipidemia 05/16/2008   Essential hypertension 05/16/2008   Disorder of bone and cartilage 05/16/2008   Urinary incontinence 05/16/2008    PCP: [name], MD  REFERRING PROVIDER: [name], MD  REFERRING DIAG: M54.10 (ICD-10-CM) - Back pain with radiculopathy  Rationale for Evaluation and Treatment: Rehabilitation  THERAPY DIAG:  Abnormal posture  Back pain with radiculopathy  ONSET DATE: 2024  SUBJECTIVE:                                                                                                                                                                                           SUBJECTIVE STATEMENT: 12/07/23:  Doing well today and denies pain.  EVAL: Arrives to the clinic with low back pain (see below). Denies any numbness or weakness on the legs. Condition started 2024 without apparent reason and gradually got worse.  Denies any trauma. Patient went to her MD and  was told she had a bulging disc and her sciatic nerve was affected. Patient had injections in the past which helped. Recently, patient was referred to outpatient PT evaluation and management.  PERTINENT HISTORY:  Partial L knee arthroplasty. Polyp on her colon (will have surgery soon)  PAIN:  Are you having pain? Yes: NPRS scale: 7/10 Pain location: R side of the low back Pain description: sharp, throbbing Aggravating factors: comes in randomly, turning R/L, standing up Relieving factors: roll on liniment  PRECAUTIONS: None  RED FLAGS: None   WEIGHT BEARING RESTRICTIONS: No  FALLS:  Has patient fallen in last 6 months? No  LIVING ENVIRONMENT: Lives with: lives with their spouse Lives in: House/apartment Stairs: Yes: External: 1 steps; none Has following equipment at home: Single point cane and Walker - 4 wheeled  OCCUPATION: retired  PLOF: Independent and Independent with basic ADLs  PATIENT GOALS: I want to get rid of this pain on my back  NEXT MD VISIT: March 2024  OBJECTIVE:  Note: Objective measures were completed at Evaluation unless otherwise noted.  DIAGNOSTIC FINDINGS:  11/01/23 CT ABDOMEN AND PELVIS WITH CONTRAST   TECHNIQUE: Multidetector CT imaging of the abdomen and pelvis was performed using the standard protocol following bolus administration of intravenous contrast.   RADIATION DOSE REDUCTION: This exam was performed according to the departmental dose-optimization program which includes automated exposure control, adjustment of the mA and/or kV according to patient size and/or use of iterative reconstruction technique.   CONTRAST:  OMNIPAQUE  IOHEXOL  300 MG/ML  SOLN   COMPARISON:  04/25/2022   FINDINGS: Lower chest: Heart is normal size. Minimal calcified plaque over the left anterior descending coronary artery. Lung bases are clear.   Hepatobiliary: Several liver hypodensities with the  largest over the right lobe measuring 3.5 cm compatible with cysts and unchanged. Gallbladder and biliary tree are normal.   Pancreas: Normal.   Spleen: Normal.   Adrenals/Urinary Tract: Adrenal glands are normal. Kidneys normal in size without hydronephrosis or nephrolithiasis. Kidneys are otherwise unchanged. Ureters and bladder are normal.   Stomach/Bowel: The stomach and small bowel are normal. Appendix not visualized. Very minimal diverticulosis of the colon as the colon is otherwise unremarkable.   Vascular/Lymphatic: Abdominal aorta is normal caliber. Remaining vascular structures are normal. No adenopathy.   Reproductive: Status post hysterectomy. No adnexal masses.   Other: No free fluid or focal inflammatory change.   Musculoskeletal: No focal abnormality.   IMPRESSION: 1. No acute findings in the abdomen/pelvis. 2. Stable liver cysts. 3. Very minimal diverticulosis of the colon. 4. Aortic atherosclerosis.   Aortic Atherosclerosis (ICD10-I70.0).  PATIENT SURVEYS:  Modified Oswestry 21/50 = 42%   COGNITION: Overall cognitive status: Within functional limits for tasks assessed     SENSATION: WFL  MUSCLE LENGTH: Hamstrings: moderate restriction on B Thomas test: moderate restriction on B Piriformis: moderate restriction on B  POSTURE: rounded shoulders, forward head, and anterior pelvic tilt  PALPATION: Grade 1 tenderness on the R paraspinals on the low back  LUMBAR ROM:   AROM eval  Flexion 75%  Extension 100%  Right lateral flexion   Left lateral flexion   Right rotation 100%  Left rotation 100%   (Blank rows = not tested)  LOWER EXTREMITY ROM:     Active  Right eval Left eval  Hip flexion [facility]  Hip extension    Hip abduction [facility]  Hip adduction    Hip internal rotation    Hip external rotation  Knee flexion [facility] WFL  Knee extension [facility]  Ankle dorsiflexion [facility]  Ankle plantarflexion [facility] WFL  Ankle inversion    Ankle  eversion     (Blank rows = not tested)  LOWER EXTREMITY MMT:    MMT Right eval Left eval  Hip flexion 4- 4-  Hip extension 3+ 3+  Hip abduction 4- 4-  Hip adduction    Hip internal rotation    Hip external rotation    Knee flexion 4 4  Knee extension 4 4  Ankle dorsiflexion 4 4  Ankle plantarflexion 4 4  Ankle inversion    Ankle eversion     (Blank rows = not tested)  LUMBAR SPECIAL TESTS:  Straight leg raise test: aggravates pain on the low back and FABER test: L FABER aggravates pain on the R lumbar area  FUNCTIONAL TESTS:  5 times sit to stand: 13.40 sec 2 minute walk test: 535 ft  GAIT: Distance walked: 535 ft Assistive device utilized: None Level of assistance: Complete Independence Comments: done during , no gait deviations seen  TREATMENT DATE:  12/07/23 Recumbent bike, seat 11, level 2 x 5' Seated hamstring stretch x 30 x 3 Seated piriformis stretch x 30 x 3 Seated trunk forward flex/lat flex stretch with physioball x 30 x 2 Standing hip hinges with dowel x 10  Standing hip hinges without dowel x 10 Lifting a box (no weights) on a low mat x 10  Standing hip abd x 10 x 2, RTB Standing I exercises x GTB x 10 x 3   12/05/23 Recumbent bike, seat 6, level 1 x 5' Seated hamstring stretch x 30 x 3 Seated hip hinges with a dowel x 10 Seated trunk forward flex/lat flex stretch with physioball x 30 x 2 Standing hip hinges with dowel x 10 x 2 Standing hip ext x 10 x 2, RTB Standing I exercises x RTB x 10 x 2 x 3   11/30/23: Supine: LTR 5x 10 Bridge 10x 5 SKTC 2x 30 Hamstring stretch 2x 30 hands behind knee Piriformis st with towel 2x 30  Sidelying: clam with RTB 10x 5  Seated: educated on lumbar support for improved seated posture STS 10x eccentric control  Standing: lumbar extension 10x   11/28/23: Reviewed goals Educated importance of HEP complaince Educated log rolling bed mobility  Supine: Deep breathing to calm CNS 1' Deep  breathing paired with TrA activation (with exhalation) Decompression 2-5 5x  Bridge 10x 5 Marching 10x5 Clam with RTB 10x 5  11/23/23 Evaluation and patient education done                                                                                                                                 PATIENT EDUCATION:  Education details: Educated on the pathoanatomy of low back pain. Educated on the goals and course of rehab.  Person educated: Patient Education method: Explanation Education comprehension: verbalized understanding  HOME EXERCISE PROGRAM: Access Code: [email or web address] URL: [email or web address] 12/07/23 - Seated Piriformis Stretch  - 1 x daily - 7 x weekly - 3 reps - 30 hold - Standing Hip Abduction with Resistance at Ankles and Counter Support  - 1 x daily - 7 x weekly - 2 sets - 10 reps  12/05/23 - Standing 'L' Stretch at Counter  - 2 x daily - 7 x weekly - 3 reps - 30 hold - Standing Hip Hinge with Dowel  - 1 x daily - 7 x weekly - 2 sets - 10 reps - Standing Hip Extension with Resistance at Ankles and Counter Support  - 1 x daily - 7 x weekly - 2 sets - 10 reps  Date: 11/28/2023 Prepared by: Augustin Mclean  Exercises - Supine Bridge  - 2 x daily - 7 x weekly - 1 sets - 10 reps - Supine March  - 2 x daily - 7 x weekly - 1 sets - 10 reps - Hooklying Isometric Clamshell  - 2 x daily - 7 x weekly - 1 sets - 10 reps -Decompression  Access Code: [email or web address] URL: [email or web address] Date: 11/30/2023 Prepared by: Augustin Mclean  Exercises - Supine Lower Trunk Rotation  - 1 x daily - 7 x weekly - 1 sets - 5 reps - 10 hold - Supine Hamstring Stretch  - 2 x daily - 7 x weekly - 1 sets - 3 reps - 30 hold - Hooklying Single Knee to Chest Stretch  - 1 x daily - 7 x weekly - 3 sets - 10 reps - Clam with Resistance  - 2 x daily - 7 x weekly - 1 sets - 10 reps - 5 hold - Sit to Stand  - 2 x daily - 7 x weekly - 1 sets - 10 reps - Standing Lumbar  Extension  - 2 x daily - 7 x weekly - 1 sets - 10 reps  ASSESSMENT:  CLINICAL IMPRESSION: 12/07/23:  Interventions today were geared towards LE and core strengthening. Tolerated all activities without worsening of symptoms. Demonstrated appropriate levels of fatigue. Still provided mod amount of multimodal cueing to ensure correct execution of activity with fair carry-over especially on the hip hinges and lifting. To date, skilled PT is required to address the impairments and improve function.   EVAL: Patient is a 73 y.o. female who was seen today for physical therapy evaluation and treatment for back pain with radiculopathy. Patient's condition is further defined by difficulty with sit-to-stand and bending over due to pain, weakness, and decreased soft tissue extensibility. Skilled PT is required to address the impairments and functional limitations listed below.    OBJECTIVE IMPAIRMENTS: decreased strength, impaired flexibility, and pain.   ACTIVITY LIMITATIONS: carrying, lifting, bending, standing, squatting, and transfers  PARTICIPATION LIMITATIONS: meal prep, cleaning, laundry, driving, shopping, community activity, and yard work  PERSONAL FACTORS: Age and Time since onset of injury/illness/exacerbation are also affecting patient's functional outcome.   REHAB POTENTIAL: Good  CLINICAL DECISION MAKING: Stable/uncomplicated  EVALUATION COMPLEXITY: Low   GOALS: Goals reviewed with patient? Yes  SHORT TERM GOALS: Target date: 12/07/23  Pt will demonstrate indep in HEP to facilitate carry-over of skilled services and improve functional outcomes Goal status: INITIAL  LONG TERM GOALS: Target date: 12/21/2023  Pt will demonstrate a decrease in modified ODI score by 13-14% to demonstrate significant improvement in ADLs Baseline: 42% Goal status: INITIAL  2.  Pt will demonstrate increase in lumbar flex ROM by 25%  to facilitate  ease in ADLs Baseline: 75% Goal status: INITIAL  3.  Pt  will decrease 5TSTS by at least 3 seconds in order to demonstrate clinically significant improvement in LE strength   Baseline: 13.40 sec Goal status: INITIAL  4.  Pt will demonstrate increase in LE strength to 4/5 to facilitate ease and safety in ambulation Baseline: 3+/5 Goal status: INITIAL  PLAN:  PT FREQUENCY: 2x/week  PT DURATION: 4 weeks  PLANNED INTERVENTIONS: [id number]- PT Re-evaluation, 97110-Therapeutic exercises, 97530- Therapeutic activity, [id number]- Neuromuscular re-education, 97535- Self Care, [id number]- Manual therapy, and Patient/Family education.  PLAN FOR NEXT SESSION: Continue POC and may progress as tolerated with emphasis on core and hip strengthening/mobility, and proper activities.  Educate proper lifting mechanics.  Vinie CROME. Charizma Gardiner, PT, DPT, OCS Board-Certified Clinical Specialist in Orthopedic PT PT Compact Privilege # ([place]): [id number] T 12/07/2023, 8:07 AM

## 2023-12-08 ENCOUNTER — Encounter: Payer: Self-pay | Admitting: Gastroenterology

## 2023-12-08 ENCOUNTER — Other Ambulatory Visit: Payer: Self-pay

## 2023-12-08 ENCOUNTER — Ambulatory Visit: Payer: Medicare Other | Admitting: Gastroenterology

## 2023-12-08 ENCOUNTER — Other Ambulatory Visit (INDEPENDENT_AMBULATORY_CARE_PROVIDER_SITE_OTHER): Payer: Medicare Other

## 2023-12-08 VITALS — BP 118/68 | HR 70 | Ht 63.0 in | Wt 167.0 lb

## 2023-12-08 DIAGNOSIS — D132 Benign neoplasm of duodenum: Secondary | ICD-10-CM | POA: Diagnosis not present

## 2023-12-08 DIAGNOSIS — R933 Abnormal findings on diagnostic imaging of other parts of digestive tract: Secondary | ICD-10-CM | POA: Diagnosis not present

## 2023-12-08 DIAGNOSIS — Z862 Personal history of diseases of the blood and blood-forming organs and certain disorders involving the immune mechanism: Secondary | ICD-10-CM | POA: Diagnosis not present

## 2023-12-08 DIAGNOSIS — E876 Hypokalemia: Secondary | ICD-10-CM | POA: Insufficient documentation

## 2023-12-08 LAB — BASIC METABOLIC PANEL
BUN: 15 mg/dL (ref 6–23)
CO2: 32 meq/L (ref 19–32)
Calcium: 9.4 mg/dL (ref 8.4–10.5)
Chloride: 98 meq/L (ref 96–112)
Creatinine, Ser: 0.94 mg/dL (ref 0.40–1.20)
GFR: 60.24 mL/min (ref >60.00)
Glucose, Bld: 87 mg/dL (ref 70–99)
Potassium: 2.9 meq/L — ABNORMAL LOW (ref 3.5–5.1)
Sodium: 140 meq/L (ref 135–145)

## 2023-12-08 LAB — CBC
HCT: 35.9 % — ABNORMAL LOW (ref 36.0–46.0)
Hemoglobin: 11.6 g/dL — ABNORMAL LOW (ref 12.0–15.0)
MCHC: 32.3 g/dL (ref 30.0–36.0)
MCV: 87.2 fL (ref 78.0–100.0)
Platelets: 295 10*3/uL (ref 150.0–400.0)
RBC: 4.11 Mil/uL (ref 3.87–5.11)
RDW: 12.8 % (ref 11.5–15.5)
WBC: 7.2 10*3/uL (ref 4.0–10.5)

## 2023-12-08 NOTE — H&P (View-Only) (Signed)
 GASTROENTEROLOGY OUTPATIENT CLINIC VISIT   Primary Care Provider [name], MD [address], [address] [place] [address] [phone]  Referring Provider Dr. Cindie  Patient Profile: Laura [name] is a 73 y.o. female with a pmh significant for hypertension, hyperlipidemia, hypothyroidism, anxiety, MDD, osteopenia, gastritis, GERD, duodenal adenoma (in situ), anemia NOS.  The patient presents to the [facility] for an evaluation and management of problem(s) noted below:  Problem List 1. Duodenal adenoma   2. Abnormal endoscopy of upper gastrointestinal tract   3. History of anemia   4. Hypokalemia     History of Present Illness This is the patient's first visit to the outpatient [facility] clinic.  The patient was evaluated by her gastroenterologist in [facility] last year for further management of symptoms of dyspepsia.  During upper endoscopy a duodenal polyp was found in the D2/D3 region.  An enteroscopy was attempted, but due to issues of positioning this was not able to be resected locally.  She is referred for consideration of endoscopic management of this lesion rather than surgery.  Patient states that overall she has been doing well.  She wonders whether dietary causes of this polyp could be a result of high consumption of [place] and grapes.  She was found to have anemia on recent blood work without evidence of iron deficiency and also recent mild hypokalemia for which she was administered potassium supplementation.  She denies significant dysphagia symptoms or progressive abdominal pain at this time.  She has not noted any changes in her bowel habits or any blood in her stools.  GI Review of Systems Positive as above Negative for odynophagia, nausea, vomiting, alteration of bowel habits, melena, hematochezia  Review of Systems General: Denies fevers/chills/weight loss unintentionally Cardiovascular: Denies chest  pain Pulmonary: Denies shortness of breath Gastroenterological: See HPI Genitourinary: Denies darkened urine Hematological: Denies easy bruising/bleeding Dermatological: Denies jaundice Psychological: Mood is stable   Medications Current Outpatient Medications  Medication Sig Dispense Refill   amLODipine  (NORVASC ) 5 MG tablet TAKE 1 TABLET BY MOUTH DAILY 100 tablet 2   atorvastatin  (LIPITOR) 80 MG tablet TAKE 1 TABLET BY MOUTH ONCE  DAILY 90 tablet 3   gabapentin  (NEURONTIN ) 100 MG capsule TAKE 2 CAPSULES BY MOUTH AT  BEDTIME FOR NEUROPATHIC PAIN 200 capsule 2   hydrochlorothiazide  (HYDRODIURIL ) 25 MG tablet Take 1 tablet (25 mg total) by mouth daily. 90 tablet 0   levothyroxine  (SYNTHROID ) 88 MCG tablet TAKE 1 TABLET BY MOUTH DAILY  BEFORE BREAKFAST 90 tablet 1   pantoprazole  (PROTONIX ) 40 MG tablet Take 1 tablet (40 mg total) by mouth daily. 30 tablet 1   acetaminophen  (TYLENOL ) 500 MG tablet Take 1 tablet (500 mg total) by mouth every 6 (six) hours as needed. (Patient not taking: Reported on 12/08/2023) 30 tablet 0   buPROPion  (WELLBUTRIN  XL) 150 MG 24 hr tablet Take one tablet by mouth once daily (Patient not taking: Reported on 12/08/2023) 30 tablet 5   Calcium  Carbonate-Vit D-Min (CALCIUM  1200) 1200-1000 MG-UNIT CHEW Take one tablet by mouth two times daily (Patient not taking: Reported on 12/08/2023) 60 tablet 5   citalopram  (CELEXA ) 40 MG tablet TAKE 1 TABLET BY MOUTH DAILY (Patient not taking: Reported on 12/08/2023) 90 tablet 3   cyclobenzaprine  (FLEXERIL ) 10 MG tablet Take 1 tablet (10 mg total) by mouth 2 (two) times daily as needed. (Patient not taking: Reported on 12/08/2023) 20 tablet 0   fluticasone  (FLONASE ) 50 MCG/ACT nasal spray Place  1 spray into both nostrils daily as needed for allergies or rhinitis. (Patient not taking: Reported on 12/08/2023)     furosemide  (LASIX ) 20 MG tablet TAKE 1 TABLET(20 MG) BY MOUTH DAILY (Patient not taking: Reported on 12/08/2023) 30 tablet 1    levocetirizine (XYZAL ) 5 MG tablet Take 1 tablet (5 mg total) by mouth every evening. (Patient not taking: Reported on 12/08/2023) 30 tablet 3   Multiple Vitamin (MULTIVITAMIN WITH MINERALS) TABS tablet Take 1 tablet by mouth daily. (Patient not taking: Reported on 12/08/2023)     Polyethyl Glycol-Propyl Glycol (SYSTANE OP) Place 1 drop into both eyes daily as needed (dry eyes). (Patient not taking: Reported on 12/08/2023)     potassium chloride  (KLOR-CON ) 10 MEQ tablet Take one tablet by mouth once daily (Patient not taking: Reported on 12/08/2023) 30 tablet 5   rosuvastatin  (CRESTOR ) 40 MG tablet Take 1 tablet (40 mg total) by mouth daily. (Patient not taking: Reported on 12/08/2023) 90 tablet 0   Current Facility-Administered Medications  Medication Dose Route Frequency Provider Last Rate Last Admin   lidocaine  (PF) (XYLOCAINE ) 1 % injection 4 mL  4 mL Other Once Golda Lynwood PARAS, MD        Allergies Allergies  Allergen Reactions   Cozaar  [Losartan  Potassium] Cough   Dilaudid [Hydromorphone Hcl] Other (See Comments)    Confusion;altered state of mind   Levaquin [Levofloxacin] Other (See Comments)    Dry throat   Nsaids Other (See Comments)    per GI pt no longer to use NSAIDS    Promethazine      Legs itching, RESTLESS LEGS    Histories Past Medical History:  Diagnosis Date   Anxiety    At low risk for fall 08/15/2018   Cystitis    Depression    Gastritis    GERD (gastroesophageal reflux disease)    Hashimoto's thyroiditis    Hx    Hyperlipidemia    Hypertension    Hypothyroidism    Normocytic anemia 2009 Hb 9.9-11.1   Osteopenia    Shoulder pain    Urinary incontinence    Past Surgical History:  Procedure Laterality Date   Bilateral tubal ligation     BIOPSY  04/21/2020   Procedure: BIOPSY;  Surgeon: Shaaron Lamar HERO, MD;  [facility];  Service: Endoscopy;;   BIOPSY  04/20/2023   Procedure: BIOPSY;  Surgeon: Cindie Carlin POUR, DO;  [facility];   Service: Endoscopy;;   BREAST EXCISIONAL BIOPSY  2010   Excisional biopsy of benign left breast mass -lopoma    COLONOSCOPY  08/26/2011   SLF: 1. Internal hemorrhoids   COLONOSCOPY N/A 10/06/2015   DOQ:jwjo fissure or internal hemorrhoids/mild sigmoid colitis. benign colonic path    COLONOSCOPY WITH PROPOFOL  N/A 03/15/2021   Procedure: COLONOSCOPY WITH PROPOFOL ;  Surgeon: Cindie Carlin POUR, DO;  [facility];  Service: Endoscopy;  Laterality: N/A;  3:00pm   colonscopy  2005   Dr. Claudene   ENTEROSCOPY N/A 07/25/2023   Procedure: ENTEROSCOPY;  Surgeon: Cindie Carlin POUR, DO;  [facility];  Service: Endoscopy;  Laterality: N/A;  1:45 pm, asa 2   ESOPHAGOGASTRODUODENOSCOPY  12/2009   chronic gastritis   ESOPHAGOGASTRODUODENOSCOPY N/A 05/17/2017   Dr. Harvey; gastritis, normal small bowel biopsies, multiple gastric polyps. fundic gland polyps   ESOPHAGOGASTRODUODENOSCOPY (EGD) WITH PROPOFOL  N/A 04/21/2020   Rourk: multiple gastric polyps, biopsy of one c/w fundic gland. gastric biopsy showed mild chronic gastritis but no H.pylori  ESOPHAGOGASTRODUODENOSCOPY (EGD) WITH PROPOFOL  N/A 04/20/2023   Procedure: ESOPHAGOGASTRODUODENOSCOPY (EGD) WITH PROPOFOL ;  Surgeon: Cindie Carlin POUR, DO;  [facility];  Service: Endoscopy;  Laterality: N/A;  1:00 pm, asa 3, pt knows to arrive at 6:30   GIVENS CAPSULE STUDY N/A 05/30/2017   gastritis, no source for anemia identified   left knee surgery Left 12/30/2015   PARTIAL KNEE ARTHROPLASTY Left 08/09/2017   Procedure: LEFT UNICOMPARTMENTAL KNEE;  Surgeon: Melodi Lerner, MD;  Location: [facility];  Service: Orthopedics;  Laterality: Left;   POLYPECTOMY  04/21/2020   Procedure: POLYPECTOMY;  Surgeon: Shaaron Lamar HERO, MD;  [facility];  Service: Endoscopy;;  gastric   Resection of left lobe of thyroid      right carpal tunnel release     rt. 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    Occupation: retired  Tobacco Use   Smoking status: Former    Current packs/day: 0.00    Average packs/day: 0.3 packs/day for 1 year (0.3 ttl pk-yrs)    Types: Cigarettes    Start date: 08/26/1971    Quit date: 08/25/1972    Years since quitting: 51.3    Passive exposure: Past   Smokeless tobacco: Never  Vaping Use   Vaping status: Never Used  Substance and Sexual Activity   Alcohol use: No   Drug use: No   Sexual activity: Not Currently    Birth control/protection: Surgical    Comment: hyst  Other Topics Concern   Not on file  Social History Narrative   Not on file   Social Drivers of Health   Financial Resource Strain: Low Risk  (09/27/2023)   Overall Financial Resource Strain (CARDIA)    Difficulty of Paying Living Expenses: Not hard at all  Food Insecurity: No Food Insecurity (09/27/2023)   Hunger Vital Sign    Worried About Running Out of Food in the Last Year: Never true    Ran Out of Food in the Last Year: Never true  Transportation Needs: No Transportation Needs (09/27/2023)   PRAPARE - Administrator, Civil Service (Medical): No    Lack of Transportation (Non-Medical): No  Physical Activity: Inactive (09/27/2023)   Exercise Vital Sign    Days of Exercise per Week: 0 days    Minutes of Exercise per Session: 0 min  Stress: Stress Concern Present (09/27/2023)   [employer] of Occupational Health - Occupational Stress Questionnaire    Feeling of Stress : To some  extent  Social Connections: Moderately Integrated (09/27/2023)   Social Connection and Isolation Panel [NHANES]    Frequency of Communication with Friends and Family: Three times a week    Frequency of Social Gatherings with Friends and Family: Three times a week    Attends Religious Services: More than 4 times per year    Active Member of Clubs or Organizations: No  Attends Banker Meetings: Never    Marital Status: Married  Catering Manager Violence: Not At Risk (09/27/2023)   Humiliation, Afraid, Rape, and Kick questionnaire    Fear of Current or Ex-Partner: No    Emotionally Abused: No    Physically Abused: No    Sexually Abused: No   Family History  Problem Relation Age of Onset   Pneumonia Mother    Healthy Father    Colon cancer Neg Hx    Liver disease Neg Hx    Esophageal cancer Neg Hx    Inflammatory bowel disease Neg Hx    Pancreatic cancer Neg Hx    Rectal cancer Neg Hx    Stomach cancer Neg Hx    I have reviewed her medical, social, and family history in detail and updated the electronic medical record as necessary.    PHYSICAL EXAMINATION  BP 118/68   Pulse 70   Ht 5' 3 (1.6 m)   Wt 167 lb (75.8 kg)   BMI 29.58 kg/m  Wt Readings from Last 3 Encounters:  12/08/23 167 lb (75.8 kg)  11/14/23 173 lb 1.3 oz (78.5 kg)  10/19/23 173 lb 9.6 oz (78.7 kg)  GEN: NAD, appears stated age, doesn't appear chronically ill PSYCH: Cooperative, without pressured speech EYE: Conjunctivae pink, sclerae anicteric ENT: MMM CV: Nontachycardic RESP: No audible wheezing GI: NABS, soft, NT/ND, without rebound MSK/EXT: No significant lower extremity edema SKIN: No jaundice NEURO:  Alert & Oriented x 3, no focal deficits   REVIEW OF DATA  I reviewed the following data at the time of this encounter:  GI Procedures and Studies  June 2024 EGD outside provider - Normal esophagus. - One duodenal polyp. Biopsied. - Gastritis. Biopsied.  Pathology FINAL  MICROSCOPIC DIAGNOSIS:  A. STOMACH WITH DUODENAL POLYP, BIOPSY:  -  Duodenal adenoma, fragments.  -  Separate fragments of unremarkable oxyntic type mucosa and duodenal  mucosa.  B. STOMACH, BIOPSY:  -  Antral mucosa with features of both mild chronic inactive gastritis  and chemical/reactive change.  -  No Helicobacter pylori organisms identified on HE stained slide.   September 2020 for [facility] A single 15-20 mm sessile polyp (previously biopsied, duodenal adenoma) with no bleeding was found in the third portion of the duodenum. Upon further inspection on today's exam, lesion is larger than initially expected. Difficult to manipulate scope in optimal fashion for EMR. Procedure then aborted.  Laboratory Studies  Reviewed those in epic  Imaging Studies  January 2025 CTAP with contrast IMPRESSION: 1. No acute findings in the abdomen/pelvis. 2. Stable liver cysts. 3. Very minimal diverticulosis of the colon. 4. Aortic atherosclerosis.   ASSESSMENT  Ms. Wilcock is a 74 y.o. female with a pmh significant for hypertension, hyperlipidemia, hypothyroidism, anxiety, MDD, osteopenia, gastritis, GERD, duodenal adenoma (in situ), anemia NOS.  The patient is seen today for evaluation and management of:  1. Duodenal adenoma   2. Abnormal endoscopy of upper gastrointestinal tract   3. History of anemia   4. Hypokalemia    The patient is hemodynamically and clinically stable at this time.  Based upon the description and endoscopic pictures I do feel that it is reasonable to pursue an Advanced Polypectomy attempt of the polyp/lesion.  We discussed some of the techniques of advanced polypectomy which include Endoscopic Mucosal Resection, OVESCO Full-Thickness Resection, Endorotor Morcellation, and Tissue Ablation via Fulguration.  We also reviewed images of typical techniques as noted above.  The risks and benefits of endoscopic evaluation  were discussed with the patient; these include but are not limited  to the risk of perforation, infection, bleeding, missed lesions, lack of diagnosis, severe illness requiring hospitalization, as well as anesthesia and sedation related illnesses.  During attempts at advanced resection, the risks of bleeding and perforation/leak are increased as opposed to diagnostic and screening procedures, and that was discussed with the patient as well.   In addition, I explained that with the possible need for piecemeal resection, subsequent short-interval endoscopic evaluation for follow up and potential retreatment of the lesion/area may be necessary.  I did offer, a referral to surgery in order for patient to have opportunity to discuss surgical management/intervention prior to finalizing decision for attempt at endoscopic removal, however, the patient deferred on this.  If, after attempt at removal of the polyp/lesion, it is found that the patient has a complication or that an invasive lesion or malignant lesion is found, or that the polyp/lesion continues to recur, the patient is aware and understands that surgery may still be indicated/required.  All patient questions were answered, to the best of my ability, and the patient agrees to the aforementioned plan of action with follow-up as indicated.    PLAN  Preprocedure labs as outlined below If patient still has evidence of anemia, as she has no IDA, recommend hematology referral Proceed with scheduled enteroscopy for attempt at duodenal adenoma resection Follow-up to be dictated based on results of enteroscopy   Orders Placed This Encounter  Procedures   CBC   Basic Metabolic Panel (BMET)    New Prescriptions   No medications on file   Modified Medications   No medications on file    Planned Follow Up No follow-ups on file.   Total Time in Face-to-Face and in Coordination of Care for patient including independent/personal interpretation/review of prior testing, medical history, examination, medication  adjustment, communicating results with the patient directly, and documentation within the EHR is 45 minutes.   Aloha Finner, MD [facility] Advanced Endoscopy Office # [phone]

## 2023-12-08 NOTE — Patient Instructions (Signed)
 Your provider has requested that you go to the basement level for lab work before leaving today. Press B on the elevator. The lab is located at the first door on the left as you exit the elevator.  Due to recent changes in healthcare laws, you may see the results of your imaging and laboratory studies on MyChart before your provider has had a chance to review them.  We understand that in some cases there may be results that are confusing or concerning to you. Not all laboratory results come back in the same time frame and the provider may be waiting for multiple results in order to interpret others.  Please give us  48 hours in order for your provider to thoroughly review all the results before contacting the office for clarification of your results.   _______________________________________________________  If your blood pressure at your visit was 140/90 or greater, please contact your primary care physician to follow up on this.  _______________________________________________________  If you are age 74 or older, your body mass index should be between 23-30. Your Body mass index is 29.58 kg/m. If this is out of the aforementioned range listed, please consider follow up with your Primary Care Provider.  If you are age 57 or younger, your body mass index should be between 19-25. Your Body mass index is 29.58 kg/m. If this is out of the aformentioned range listed, please consider follow up with your Primary Care Provider.   ________________________________________________________  The [facility] providers would like to encourage you to use MYCHART to communicate with providers for non-urgent requests or questions.  Due to long hold times on the telephone, sending your provider a message by [facility] may be a faster and more efficient way to get a response.  Please allow 48 business hours for a response.  Please remember that this is for non-urgent requests.   _______________________________________________________  Thank you for choosing me and [facility].  Dr. Wilhelmenia

## 2023-12-08 NOTE — Progress Notes (Signed)
 GASTROENTEROLOGY OUTPATIENT CLINIC VISIT   Primary Care Provider [name], MD [address], [address] [place] [address] [phone]  Referring Provider Dr. Cindie  Patient Profile: Laura [name] is a 73 y.o. female with a pmh significant for hypertension, hyperlipidemia, hypothyroidism, anxiety, MDD, osteopenia, gastritis, GERD, duodenal adenoma (in situ), anemia NOS.  The patient presents to the [facility] for an evaluation and management of problem(s) noted below:  Problem List 1. Duodenal adenoma   2. Abnormal endoscopy of upper gastrointestinal tract   3. History of anemia   4. Hypokalemia     History of Present Illness This is the patient's first visit to the outpatient [facility].  The patient was evaluated by her gastroenterologist in [facility] last year for further management of symptoms of dyspepsia.  During upper endoscopy a duodenal polyp was found in the D2/D3 region.  An enteroscopy was attempted, but due to issues of positioning this was not able to be resected locally.  She is referred for consideration of endoscopic management of this lesion rather than surgery.  Patient states that overall she has been doing well.  She wonders whether dietary causes of this polyp could be a result of high consumption of [place] and grapes.  She was found to have anemia on recent blood work without evidence of iron deficiency and also recent mild hypokalemia for which she was administered potassium supplementation.  She denies significant dysphagia symptoms or progressive abdominal pain at this time.  She has not noted any changes in her bowel habits or any blood in her stools.  GI Review of Systems Positive as above Negative for odynophagia, nausea, vomiting, alteration of bowel habits, melena, hematochezia  Review of Systems General: Denies fevers/chills/weight loss unintentionally Cardiovascular: Denies chest  pain Pulmonary: Denies shortness of breath Gastroenterological: See HPI Genitourinary: Denies darkened urine Hematological: Denies easy bruising/bleeding Dermatological: Denies jaundice Psychological: Mood is stable   Medications Current Outpatient Medications  Medication Sig Dispense Refill   amLODipine  (NORVASC ) 5 MG tablet TAKE 1 TABLET BY MOUTH DAILY 100 tablet 2   atorvastatin  (LIPITOR) 80 MG tablet TAKE 1 TABLET BY MOUTH ONCE  DAILY 90 tablet 3   gabapentin  (NEURONTIN ) 100 MG capsule TAKE 2 CAPSULES BY MOUTH AT  BEDTIME FOR NEUROPATHIC PAIN 200 capsule 2   hydrochlorothiazide  (HYDRODIURIL ) 25 MG tablet Take 1 tablet (25 mg total) by mouth daily. 90 tablet 0   levothyroxine  (SYNTHROID ) 88 MCG tablet TAKE 1 TABLET BY MOUTH DAILY  BEFORE BREAKFAST 90 tablet 1   pantoprazole  (PROTONIX ) 40 MG tablet Take 1 tablet (40 mg total) by mouth daily. 30 tablet 1   acetaminophen  (TYLENOL ) 500 MG tablet Take 1 tablet (500 mg total) by mouth every 6 (six) hours as needed. (Patient not taking: Reported on 12/08/2023) 30 tablet 0   buPROPion  (WELLBUTRIN  XL) 150 MG 24 hr tablet Take one tablet by mouth once daily (Patient not taking: Reported on 12/08/2023) 30 tablet 5   Calcium  Carbonate-Vit D-Min (CALCIUM  1200) 1200-1000 MG-UNIT CHEW Take one tablet by mouth two times daily (Patient not taking: Reported on 12/08/2023) 60 tablet 5   citalopram  (CELEXA ) 40 MG tablet TAKE 1 TABLET BY MOUTH DAILY (Patient not taking: Reported on 12/08/2023) 90 tablet 3   cyclobenzaprine  (FLEXERIL ) 10 MG tablet Take 1 tablet (10 mg total) by mouth 2 (two) times daily as needed. (Patient not taking: Reported on 12/08/2023) 20 tablet 0   fluticasone  (FLONASE ) 50 MCG/ACT nasal spray Place  1 spray into both nostrils daily as needed for allergies or rhinitis. (Patient not taking: Reported on 12/08/2023)     furosemide  (LASIX ) 20 MG tablet TAKE 1 TABLET(20 MG) BY MOUTH DAILY (Patient not taking: Reported on 12/08/2023) 30 tablet 1    levocetirizine (XYZAL ) 5 MG tablet Take 1 tablet (5 mg total) by mouth every evening. (Patient not taking: Reported on 12/08/2023) 30 tablet 3   Multiple Vitamin (MULTIVITAMIN WITH MINERALS) TABS tablet Take 1 tablet by mouth daily. (Patient not taking: Reported on 12/08/2023)     Polyethyl Glycol-Propyl Glycol (SYSTANE OP) Place 1 drop into both eyes daily as needed (dry eyes). (Patient not taking: Reported on 12/08/2023)     potassium chloride  (KLOR-CON ) 10 MEQ tablet Take one tablet by mouth once daily (Patient not taking: Reported on 12/08/2023) 30 tablet 5   rosuvastatin  (CRESTOR ) 40 MG tablet Take 1 tablet (40 mg total) by mouth daily. (Patient not taking: Reported on 12/08/2023) 90 tablet 0   Current Facility-Administered Medications  Medication Dose Route Frequency Provider Last Rate Last Admin   lidocaine  (PF) (XYLOCAINE ) 1 % injection 4 mL  4 mL Other Once Golda Lynwood PARAS, MD        Allergies Allergies  Allergen Reactions   Cozaar  [Losartan  Potassium] Cough   Dilaudid [Hydromorphone Hcl] Other (See Comments)    Confusion;altered state of mind   Levaquin [Levofloxacin] Other (See Comments)    Dry throat   Nsaids Other (See Comments)    per GI pt no longer to use NSAIDS    Promethazine      Legs itching, RESTLESS LEGS    Histories Past Medical History:  Diagnosis Date   Anxiety    At low risk for fall 08/15/2018   Cystitis    Depression    Gastritis    GERD (gastroesophageal reflux disease)    Hashimoto's thyroiditis    Hx    Hyperlipidemia    Hypertension    Hypothyroidism    Normocytic anemia 2009 Hb 9.9-11.1   Osteopenia    Shoulder pain    Urinary incontinence    Past Surgical History:  Procedure Laterality Date   Bilateral tubal ligation     BIOPSY  04/21/2020   Procedure: BIOPSY;  Surgeon: Shaaron Lamar HERO, MD;  [facility];  Service: Endoscopy;;   BIOPSY  04/20/2023   Procedure: BIOPSY;  Surgeon: Cindie Carlin POUR, DO;  [facility]: [facility];   Service: Endoscopy;;   BREAST EXCISIONAL BIOPSY  2010   Excisional biopsy of benign left breast mass -lopoma    COLONOSCOPY  08/26/2011   SLF: 1. Internal hemorrhoids   COLONOSCOPY N/A 10/06/2015   DOQ:jwjo fissure or internal hemorrhoids/mild sigmoid colitis. benign colonic path    COLONOSCOPY WITH PROPOFOL  N/A 03/15/2021   Procedure: COLONOSCOPY WITH PROPOFOL ;  Surgeon: Cindie Carlin POUR, DO;  [facility];  Service: Endoscopy;  Laterality: N/A;  3:00pm   colonscopy  2005   Dr. Claudene   ENTEROSCOPY N/A 07/25/2023   Procedure: ENTEROSCOPY;  Surgeon: Cindie Carlin POUR, DO;  [facility]: [facility];  Service: Endoscopy;  Laterality: N/A;  1:45 pm, asa 2   ESOPHAGOGASTRODUODENOSCOPY  12/2009   chronic gastritis   ESOPHAGOGASTRODUODENOSCOPY N/A 05/17/2017   Dr. Harvey; gastritis, normal small bowel biopsies, multiple gastric polyps. fundic gland polyps   ESOPHAGOGASTRODUODENOSCOPY (EGD) WITH PROPOFOL  N/A 04/21/2020   Rourk: multiple gastric polyps, biopsy of one c/w fundic gland. gastric biopsy showed mild chronic gastritis but no H.pylori  ESOPHAGOGASTRODUODENOSCOPY (EGD) WITH PROPOFOL  N/A 04/20/2023   Procedure: ESOPHAGOGASTRODUODENOSCOPY (EGD) WITH PROPOFOL ;  Surgeon: Cindie Carlin POUR, DO;  [facility];  Service: Endoscopy;  Laterality: N/A;  1:00 pm, asa 3, pt knows to arrive at 6:30   GIVENS CAPSULE STUDY N/A 05/30/2017   gastritis, no source for anemia identified   left knee surgery Left 12/30/2015   PARTIAL KNEE ARTHROPLASTY Left 08/09/2017   Procedure: LEFT UNICOMPARTMENTAL KNEE;  Surgeon: Melodi Lerner, MD;  Location: [facility];  Service: Orthopedics;  Laterality: Left;   POLYPECTOMY  04/21/2020   Procedure: POLYPECTOMY;  Surgeon: Shaaron Lamar HERO, MD;  [facility];  Service: Endoscopy;;  gastric   Resection of left lobe of thyroid      right carpal tunnel release     rt. 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    Occupation: retired  Tobacco Use   Smoking status: Former    Current packs/day: 0.00    Average packs/day: 0.3 packs/day for 1 year (0.3 ttl pk-yrs)    Types: Cigarettes    Start date: 08/26/1971    Quit date: 08/25/1972    Years since quitting: 51.3    Passive exposure: Past   Smokeless tobacco: Never  Vaping Use   Vaping status: Never Used  Substance and Sexual Activity   Alcohol use: No   Drug use: No   Sexual activity: Not Currently    Birth control/protection: Surgical    Comment: hyst  Other Topics Concern   Not on file  Social History Narrative   Not on file   Social Drivers of Health   Financial Resource Strain: Low Risk  (09/27/2023)   Overall Financial Resource Strain (CARDIA)    Difficulty of Paying Living Expenses: Not hard at all  Food Insecurity: No Food Insecurity (09/27/2023)   Hunger Vital Sign    Worried About Running Out of Food in the Last Year: Never true    Ran Out of Food in the Last Year: Never true  Transportation Needs: No Transportation Needs (09/27/2023)   PRAPARE - Administrator, Civil Service (Medical): No    Lack of Transportation (Non-Medical): No  Physical Activity: Inactive (09/27/2023)   Exercise Vital Sign    Days of Exercise per Week: 0 days    Minutes of Exercise per Session: 0 min  Stress: Stress Concern Present (09/27/2023)   [employer] of Occupational Health - Occupational Stress Questionnaire    Feeling of Stress : To some  extent  Social Connections: Moderately Integrated (09/27/2023)   Social Connection and Isolation Panel [NHANES]    Frequency of Communication with Friends and Family: Three times a week    Frequency of Social Gatherings with Friends and Family: Three times a week    Attends Religious Services: More than 4 times per year    Active Member of Clubs or Organizations: No  Attends Banker Meetings: Never    Marital Status: Married  Catering Manager Violence: Not At Risk (09/27/2023)   Humiliation, Afraid, Rape, and Kick questionnaire    Fear of Current or Ex-Partner: No    Emotionally Abused: No    Physically Abused: No    Sexually Abused: No   Family History  Problem Relation Age of Onset   Pneumonia Mother    Healthy Father    Colon cancer Neg Hx    Liver disease Neg Hx    Esophageal cancer Neg Hx    Inflammatory bowel disease Neg Hx    Pancreatic cancer Neg Hx    Rectal cancer Neg Hx    Stomach cancer Neg Hx    I have reviewed her medical, social, and family history in detail and updated the electronic medical record as necessary.    PHYSICAL EXAMINATION  BP 118/68   Pulse 70   Ht 5' 3 (1.6 m)   Wt 167 lb (75.8 kg)   BMI 29.58 kg/m  Wt Readings from Last 3 Encounters:  12/08/23 167 lb (75.8 kg)  11/14/23 173 lb 1.3 oz (78.5 kg)  10/19/23 173 lb 9.6 oz (78.7 kg)  GEN: NAD, appears stated age, doesn't appear chronically ill PSYCH: Cooperative, without pressured speech EYE: Conjunctivae pink, sclerae anicteric ENT: MMM CV: Nontachycardic RESP: No audible wheezing GI: NABS, soft, NT/ND, without rebound MSK/EXT: No significant lower extremity edema SKIN: No jaundice NEURO:  Alert & Oriented x 3, no focal deficits   REVIEW OF DATA  I reviewed the following data at the time of this encounter:  GI Procedures and Studies  June 2024 EGD outside provider - Normal esophagus. - One duodenal polyp. Biopsied. - Gastritis. Biopsied.  Pathology FINAL  MICROSCOPIC DIAGNOSIS:  A. STOMACH WITH DUODENAL POLYP, BIOPSY:  -  Duodenal adenoma, fragments.  -  Separate fragments of unremarkable oxyntic type mucosa and duodenal  mucosa.  B. STOMACH, BIOPSY:  -  Antral mucosa with features of both mild chronic inactive gastritis  and chemical/reactive change.  -  No Helicobacter pylori organisms identified on HE stained slide.   September 2020 for [facility]BE A single 15-20 mm sessile polyp (previously biopsied, duodenal adenoma) with no bleeding was found in the third portion of the duodenum. Upon further inspection on today's exam, lesion is larger than initially expected. Difficult to manipulate scope in optimal fashion for EMR. Procedure then aborted.  Laboratory Studies  Reviewed those in epic  Imaging Studies  January 2025 CTAP with contrast IMPRESSION: 1. No acute findings in the abdomen/pelvis. 2. Stable liver cysts. 3. Very minimal diverticulosis of the colon. 4. Aortic atherosclerosis.   ASSESSMENT  Ms. Wilcock is a 74 y.o. female with a pmh significant for hypertension, hyperlipidemia, hypothyroidism, anxiety, MDD, osteopenia, gastritis, GERD, duodenal adenoma (in situ), anemia NOS.  The patient is seen today for evaluation and management of:  1. Duodenal adenoma   2. Abnormal endoscopy of upper gastrointestinal tract   3. History of anemia   4. Hypokalemia    The patient is hemodynamically and clinically stable at this time.  Based upon the description and endoscopic pictures I do feel that it is reasonable to pursue an Advanced Polypectomy attempt of the polyp/lesion.  We discussed some of the techniques of advanced polypectomy which include Endoscopic Mucosal Resection, OVESCO Full-Thickness Resection, Endorotor Morcellation, and Tissue Ablation via Fulguration.  We also reviewed images of typical techniques as noted above.  The risks and benefits of endoscopic evaluation  were discussed with the patient; these include but are not limited  to the risk of perforation, infection, bleeding, missed lesions, lack of diagnosis, severe illness requiring hospitalization, as well as anesthesia and sedation related illnesses.  During attempts at advanced resection, the risks of bleeding and perforation/leak are increased as opposed to diagnostic and screening procedures, and that was discussed with the patient as well.   In addition, I explained that with the possible need for piecemeal resection, subsequent short-interval endoscopic evaluation for follow up and potential retreatment of the lesion/area may be necessary.  I did offer, a referral to surgery in order for patient to have opportunity to discuss surgical management/intervention prior to finalizing decision for attempt at endoscopic removal, however, the patient deferred on this.  If, after attempt at removal of the polyp/lesion, it is found that the patient has a complication or that an invasive lesion or malignant lesion is found, or that the polyp/lesion continues to recur, the patient is aware and understands that surgery may still be indicated/required.  All patient questions were answered, to the best of my ability, and the patient agrees to the aforementioned plan of action with follow-up as indicated.    PLAN  Preprocedure labs as outlined below If patient still has evidence of anemia, as she has no IDA, recommend hematology referral Proceed with scheduled enteroscopy for attempt at duodenal adenoma resection Follow-up to be dictated based on results of enteroscopy   Orders Placed This Encounter  Procedures   CBC   Basic Metabolic Panel (BMET)    New Prescriptions   No medications on file   Modified Medications   No medications on file    Planned Follow Up No follow-ups on file.   Total Time in Face-to-Face and in Coordination of Care for patient including independent/personal interpretation/review of prior testing, medical history, examination, medication  adjustment, communicating results with the patient directly, and documentation within the EHR is 45 minutes.   Aloha Finner, MD [facility] Advanced Endoscopy Office # [phone]

## 2023-12-10 ENCOUNTER — Other Ambulatory Visit: Payer: Self-pay | Admitting: Family Medicine

## 2023-12-10 DIAGNOSIS — D649 Anemia, unspecified: Secondary | ICD-10-CM

## 2023-12-11 ENCOUNTER — Ambulatory Visit (HOSPITAL_BASED_OUTPATIENT_CLINIC_OR_DEPARTMENT_OTHER): Payer: Medicare Other | Admitting: Anesthesiology

## 2023-12-11 ENCOUNTER — Encounter (HOSPITAL_COMMUNITY)
Admission: RE | Disposition: A | Discharge status: Home / Self Care | Payer: Self-pay | Source: Home / Self Care | Attending: Gastroenterology

## 2023-12-11 ENCOUNTER — Encounter (HOSPITAL_COMMUNITY): Payer: Self-pay | Admitting: Gastroenterology

## 2023-12-11 ENCOUNTER — Other Ambulatory Visit: Payer: Self-pay

## 2023-12-11 ENCOUNTER — Telehealth: Payer: Self-pay

## 2023-12-11 ENCOUNTER — Ambulatory Visit (HOSPITAL_COMMUNITY): Payer: Medicare Other | Admitting: Anesthesiology

## 2023-12-11 ENCOUNTER — Ambulatory Visit (HOSPITAL_COMMUNITY)
Admission: RE | Admit: 2023-12-11 | Discharge: 2023-12-11 | Disposition: A | Discharge status: Home / Self Care | Payer: Medicare Other | Attending: Gastroenterology | Admitting: Gastroenterology

## 2023-12-11 DIAGNOSIS — K317 Polyp of stomach and duodenum: Secondary | ICD-10-CM

## 2023-12-11 DIAGNOSIS — D132 Benign neoplasm of duodenum: Secondary | ICD-10-CM

## 2023-12-11 DIAGNOSIS — Z87891 Personal history of nicotine dependence: Secondary | ICD-10-CM | POA: Insufficient documentation

## 2023-12-11 DIAGNOSIS — K2289 Other specified disease of esophagus: Secondary | ICD-10-CM

## 2023-12-11 DIAGNOSIS — E876 Hypokalemia: Secondary | ICD-10-CM | POA: Diagnosis not present

## 2023-12-11 HISTORY — PX: SUBMUCOSAL TATTOO INJECTION: SHX6856

## 2023-12-11 HISTORY — PX: SUBMUCOSAL LIFTING INJECTION: SHX6855

## 2023-12-11 HISTORY — PX: HEMOSTASIS CLIP PLACEMENT: SHX6857

## 2023-12-11 HISTORY — PX: HOT HEMOSTASIS: SHX5433

## 2023-12-11 HISTORY — PX: ENTEROSCOPY: SHX5533

## 2023-12-11 HISTORY — PX: HEMOSTASIS CONTROL: SHX6838

## 2023-12-11 HISTORY — PX: ENDOSCOPIC MUCOSAL RESECTION: SHX6839

## 2023-12-11 SURGERY — ENTEROSCOPY
Anesthesia: Monitor Anesthesia Care

## 2023-12-11 MED ORDER — SODIUM CHLORIDE 0.9 % IV SOLN: INTRAVENOUS | Status: DC

## 2023-12-11 MED ORDER — SUCRALFATE 1 G PO TABS: 1.0000 g | ORAL_TABLET | Freq: Two times a day (BID) | ORAL | 0 refills | Status: DC

## 2023-12-11 MED ORDER — PROPOFOL 500 MG/50ML IV EMUL
INTRAVENOUS | Status: AC
  Filled 2023-12-11: qty 50

## 2023-12-11 MED ORDER — PROPOFOL 500 MG/50ML IV EMUL
INTRAVENOUS | Status: DC | PRN
  Administered 2023-12-11: 50 mg via INTRAVENOUS
  Administered 2023-12-11: 30 mg via INTRAVENOUS
  Administered 2023-12-11: 80 ug/kg/min via INTRAVENOUS

## 2023-12-11 MED ORDER — PHENYLEPHRINE HCL (PRESSORS) 10 MG/ML IV SOLN
INTRAVENOUS | Status: DC | PRN
  Administered 2023-12-11 (×3): 160 ug via INTRAVENOUS

## 2023-12-11 MED ORDER — SODIUM CHLORIDE 0.9 % IV SOLN: INTRAVENOUS | Status: DC | PRN

## 2023-12-11 MED ORDER — GLYCOPYRROLATE 0.2 MG/ML IJ SOLN
INTRAMUSCULAR | Status: DC | PRN
  Administered 2023-12-11 (×2): .1 mg via INTRAVENOUS

## 2023-12-11 MED ORDER — PANTOPRAZOLE SODIUM 40 MG PO TBEC: 40.0000 mg | DELAYED_RELEASE_TABLET | Freq: Every day | ORAL | 1 refills | Status: DC

## 2023-12-11 MED ORDER — SPOT INK MARKER SYRINGE KIT
PACK | SUBMUCOSAL | Status: DC | PRN
  Administered 2023-12-11: 2.5 mL via SUBMUCOSAL

## 2023-12-11 MED ORDER — PANTOPRAZOLE SODIUM 40 MG PO TBEC: 40.0000 mg | DELAYED_RELEASE_TABLET | Freq: Two times a day (BID) | ORAL | 0 refills | Status: DC

## 2023-12-11 MED ORDER — LIDOCAINE HCL (CARDIAC) PF 100 MG/5ML IV SOSY
PREFILLED_SYRINGE | INTRAVENOUS | Status: DC | PRN
  Administered 2023-12-11: 100 mg via INTRAVENOUS

## 2023-12-11 NOTE — Telephone Encounter (Signed)
 The order has been placed as ordered

## 2023-12-11 NOTE — Anesthesia Preprocedure Evaluation (Signed)
 Anesthesia Evaluation  Patient identified by MRN, date of birth, ID band Patient awake    Reviewed: Allergy & Precautions, H&P , NPO status , Patient's Chart, lab work & pertinent test results  Airway Mallampati: II   Neck ROM: full    Dental   Pulmonary former smoker   breath sounds clear to auscultation       Cardiovascular hypertension,  Rhythm:regular Rate:Normal     Neuro/Psych  Headaches PSYCHIATRIC DISORDERS Anxiety Depression     Neuromuscular disease    GI/Hepatic ,GERD  ,,  Endo/Other  Hypothyroidism    Renal/GU      Musculoskeletal  (+) Arthritis ,    Abdominal   Peds  Hematology   Anesthesia Other Findings   Reproductive/Obstetrics                             Anesthesia Physical Anesthesia Plan  ASA: 2  Anesthesia Plan: MAC   Post-op Pain Management:    Induction: Intravenous  PONV Risk Score and Plan: 2 and Propofol  infusion and Treatment may vary due to age or medical condition  Airway Management Planned: Nasal Cannula  Additional Equipment:   Intra-op Plan:   Post-operative Plan:   Informed Consent: I have reviewed the patients History and Physical, chart, labs and discussed the procedure including the risks, benefits and alternatives for the proposed anesthesia with the patient or authorized representative who has indicated his/her understanding and acceptance.     Dental advisory given  Plan Discussed with: CRNA, Anesthesiologist and Surgeon  Anesthesia Plan Comments:        Anesthesia Quick Evaluation

## 2023-12-11 NOTE — Op Note (Signed)
 [facility] Patient Name: Laura [name] Procedure Date: 12/11/2023 MRN: [id number] Attending MD: Yong Henle , MD, [id number] Date of Birth: 10/31/1951 CSN: [id number] Age: 73 Admit Type: Outpatient Procedure:                Small bowel enteroscopy Indications:              For therapy of adenomatous polyps in the duodenum Providers:                Yong Henle, MD, Nikki Barters RN, RN, Rinda Cheers, Technician, Arlin Benes, Technician Referring MD:              Medicines:                Monitored Anesthesia Care Complications:            No immediate complications. Estimated Blood Loss:     Estimated blood loss was minimal. Procedure:                Pre-Anesthesia Assessment:                           - Prior to the procedure, a History and Physical                            was performed, and patient medications and                            allergies were reviewed. The patient's tolerance of                            previous anesthesia was also reviewed. The risks                            and benefits of the procedure and the sedation                            options and risks were discussed with the patient.                            All questions were answered, and informed consent                            was obtained. Prior Anticoagulants: The patient has                            taken no anticoagulant or antiplatelet agents. ASA                            Grade Assessment: III - A patient with severe                            systemic disease. After reviewing the risks and  benefits, the patient was deemed in satisfactory                            condition to undergo the procedure.                           After obtaining informed consent, the endoscope was                            passed under direct vision. Throughout the                            procedure, the patient's  blood pressure, pulse, and                            oxygen saturations were monitored continuously. The                            [id number]-[id number] ([id number]) Olympus therapeutic endoscope                            was introduced through the mouth and advanced to                            the fourth part of duodenum. The [id number]                            ([id number]) Olympus duodenoscope was introduced                            through the mouth and advanced to the fourth part                            of duodenum. The [id number]-[id number] ([id number]) Olympus                            colonoscope was introduced through the mouth and                            advanced to the proximal jejunum. The small bowel                            enteroscopy was technically difficult and complex                            due to abnormal anatomy and inadequate patient                            positioning. Successful completion of the procedure                            was aided by performing the maneuvers documented                            (  below) in this report. The patient tolerated the                            procedure. Scope In: Scope Out: Findings:      No gross lesions were noted in the entire esophagus.      The Z-line was irregular and was found 37 cm from the incisors.      Multiple small sessile polyps were found in the cardia, in the gastric       fundus and in the gastric body -fundic gland in appearance.      No other gross lesions were noted in the entire examined stomach.      Normal mucosa was found in the duodenal bulb, in the first portion of       the duodenum, in the second portion of the duodenum, at the major       papilla and in the third portion of the duodenum.      A single 25 mm sessile polyp with no bleeding was found in the       third/fourth portion of the duodenum. Positioning was difficult as a       result of the superior aspect of the lesion taking up 50% of the wall.        Visualization of this area with standard therapeutic endoscope was not       able to maintain any position to attempt resection. Using a       duodenoscope, we were able to visualize part of the lesion proximally       and distally and initiated resection while then transitioning back to a       pediatric colonoscope to continue resection. Demarcation of the lesion       was performed with high-definition white light and narrow band imaging       to clearly identify the boundaries of the lesion. EverLift was injected       to raise the lesion. Piecemeal mucosal resection using a snare was       performed. We had to transition to underwater resection to allow the       polyp to float. No change in patient's position could allow for more       appropriate positioning for resection and we had to lock both the       left/right dials and up/down diagnosed with gentle movements to get a       better position. Resection and retrieval were complete. Resected tissue       margins were examined and clear of polyp tissue. Coagulation for tissue       destruction to the margin and base using argon plasma was successful.       Area was successfully injected with 2 mL PuraStat for hemostasis. For       location marking, one hemostatic clip was successfully placed (MR       conditional). There was no bleeding at the end of the procedure. Area       proximal to the resection site was tattooed with an injection of Spot       (carbon black).      Normal mucosa was found in the visualized proximal jejunum. Impression:               - No gross lesions in the entire esophagus. Z-line  irregular, 37 cm from the incisors.                           - Multiple gastric polyps - fundic gland in                            appearance..                           - No other gross lesions in the entire stomach.                           - Normal mucosa was found in the duodenal bulb, in                             the first portion of the duodenum, in the second                            portion of the duodenum, in the major papilla and                            in the third portion of the duodenum.                           - A single duodenal polyp found in the third/fourth                            portion of the duodenum. Positioning as documented                            above very difficult requiring multiple scopes and                            lift/underwater flotation resection attempt.                            Resected and retrieved with cold snare. Treated                            with argon plasma coagulation (APC) to the margin                            and base. Injected PuraStat for hemostasis                            purposes. A single clip (MR conditional) was placed                            for demarcation purposes on the area of resection.                            Tattooed proximal to the resection site for future  purposes.                           - Normal mucosa was found in the visualized                            proximal jejunum. Recommendation:           - The patient will be observed post-procedure,                            until all discharge criteria are met.                           - Discharge patient to home.                           - Patient has a contact number available for                            emergencies. The signs and symptoms of potential                            delayed complications were discussed with the                            patient. Return to normal activities tomorrow.                            Written discharge instructions were provided to the                            patient.                           - Full liquid diet today.                           - PPI 40 mg twice daily for 1 month. Then may go                            back to once daily as already  prescribed.                           - Carafate  1 g twice daily for 2 weeks then may                            stop.                           - Please proceed with KUB at [facility] office or at                            [facility] to evaluate site of clip and allow us  to  better have distinction in future, should this area                            continue to recur and require surgical management                            at some point. Hopefully that is not the case.                           - Await pathology results.                           - Repeat the small bowel enteroscopy in 6-12 months                            for surveillance (pending pathology shows no                            evidence of high-grade dysplasia or cancer).                           - The findings and recommendations were discussed                            with the patient.                           - The findings and recommendations were discussed                            with the patient's family. Procedure Code(s):        --- Professional ---                           [id number], Small intestinal endoscopy, enteroscopy                            beyond second portion of duodenum, not including                            ileum; with ablation of tumor(s), polyp(s), or                            other lesion(s) not amenable to removal by hot                            biopsy forceps, bipolar cautery or snare technique                           44364, Small intestinal endoscopy, enteroscopy                            beyond second portion of duodenum, not including  ileum; with removal of tumor(s), polyp(s), or other                            lesion(s) by snare technique Diagnosis Code(s):        --- Professional ---                           K22.89, Other specified disease of esophagus                           K31.7, Polyp of stomach and duodenum                            D13.2, Benign neoplasm of duodenum CPT copyright 2022 [employer]. All rights reserved. The codes documented in this report are preliminary and upon coder review may  be revised to meet current compliance requirements. Yong Henle, MD 12/11/2023 2:26:01 PM Number of Addenda: 0

## 2023-12-11 NOTE — Interval H&P Note (Signed)
 History and Physical Interval Note:  12/11/2023 10:06 AM  Laura [name]  has presented today for surgery, with the diagnosis of duodenal adenoma.  The various methods of treatment have been discussed with the patient and family. After consideration of risks, benefits and other options for treatment, the patient has consented to  Procedure(s): ENTEROSCOPY (N/A) ENDOSCOPIC MUCOSAL RESECTION (N/A) as a surgical intervention.  The patient's history has been reviewed, patient examined, no change in status, stable for surgery.  I have reviewed the patient's chart and labs.  Questions were answered to the patient's satisfaction.     Emmalina Espericueta Mansouraty Jr

## 2023-12-11 NOTE — Transfer of Care (Signed)
 Immediate Anesthesia Transfer of Care Note  Patient: Laura [name]  Procedure(s) Performed: ENTEROSCOPY ENDOSCOPIC MUCOSAL RESECTION SUBMUCOSAL LIFTING INJECTION SUBMUCOSAL TATTOO INJECTION HEMOSTASIS CONTROL HEMOSTASIS CLIP PLACEMENT HOT HEMOSTASIS (ARGON PLASMA COAGULATION/BICAP)  Patient Location: PACU and Endoscopy Unit  Anesthesia Type:MAC  Level of Consciousness: drowsy  Airway & Oxygen Therapy: Patient Spontanous Breathing and Patient connected to face mask oxygen  Post-op Assessment: Report given to RN and Post -op Vital signs reviewed and stable  Post vital signs: Reviewed and stable  Last Vitals:  Vitals Value Taken Time  BP 117/62 12/11/23 1430  Temp 36.6 C 12/11/23 1425  Pulse 54 12/11/23 1431  Resp 22 12/11/23 1431  SpO2 92 % 12/11/23 1431  Vitals shown include unfiled device data.  Last Pain:  Vitals:   12/11/23 1425  TempSrc: Temporal  PainSc: 0-No pain         Complications: No notable events documented.

## 2023-12-11 NOTE — Discharge Instructions (Signed)

## 2023-12-11 NOTE — Anesthesia Procedure Notes (Signed)
 Procedure Name: MAC Date/Time: 12/11/2023 12:46 PM  Performed by: Norvell Beers, CRNAPre-anesthesia Checklist: Patient identified, Suction available, Patient being monitored, Emergency Drugs available and Timeout performed Patient Re-evaluated:Patient Re-evaluated prior to induction Oxygen Delivery Method: Simple face mask Preoxygenation: Pre-oxygenation with 100% oxygen Placement Confirmation: positive ETCO2

## 2023-12-11 NOTE — Telephone Encounter (Signed)
-----   Message from [facility] sent at 12/11/2023  3:03 PM EST ----- Regarding: KUB needed Xavien Dauphinais or [place], This patient needs a KUB for evaluation of a recently placed hemoclip at time of her duodenal adenoma resection today. She is going to go to [facility] or to our office in the next 24-36 hours. Can you get the KUB order in? Thanks. GM

## 2023-12-12 ENCOUNTER — Other Ambulatory Visit: Payer: Self-pay

## 2023-12-12 ENCOUNTER — Encounter (HOSPITAL_COMMUNITY): Payer: Medicare Other

## 2023-12-12 ENCOUNTER — Encounter (HOSPITAL_COMMUNITY): Payer: Self-pay | Admitting: Gastroenterology

## 2023-12-12 DIAGNOSIS — E876 Hypokalemia: Secondary | ICD-10-CM

## 2023-12-12 LAB — SURGICAL PATHOLOGY

## 2023-12-13 ENCOUNTER — Inpatient Hospital Stay: Payer: Medicare Other | Attending: Hematology | Admitting: Hematology

## 2023-12-13 ENCOUNTER — Encounter (HOSPITAL_COMMUNITY): Payer: Self-pay

## 2023-12-13 ENCOUNTER — Inpatient Hospital Stay: Payer: Medicare Other

## 2023-12-13 ENCOUNTER — Encounter: Payer: Self-pay | Admitting: Hematology

## 2023-12-13 ENCOUNTER — Other Ambulatory Visit: Payer: Self-pay | Admitting: Family Medicine

## 2023-12-13 VITALS — BP 129/67 | HR 63 | Temp 97.0°F | Resp 18 | Ht 63.0 in | Wt 166.0 lb

## 2023-12-13 DIAGNOSIS — Z8719 Personal history of other diseases of the digestive system: Secondary | ICD-10-CM | POA: Diagnosis not present

## 2023-12-13 DIAGNOSIS — K5730 Diverticulosis of large intestine without perforation or abscess without bleeding: Secondary | ICD-10-CM | POA: Insufficient documentation

## 2023-12-13 DIAGNOSIS — K219 Gastro-esophageal reflux disease without esophagitis: Secondary | ICD-10-CM | POA: Insufficient documentation

## 2023-12-13 DIAGNOSIS — I10 Essential (primary) hypertension: Secondary | ICD-10-CM | POA: Insufficient documentation

## 2023-12-13 DIAGNOSIS — E785 Hyperlipidemia, unspecified: Secondary | ICD-10-CM | POA: Insufficient documentation

## 2023-12-13 DIAGNOSIS — Z79890 Hormone replacement therapy (postmenopausal): Secondary | ICD-10-CM | POA: Insufficient documentation

## 2023-12-13 DIAGNOSIS — E063 Autoimmune thyroiditis: Secondary | ICD-10-CM | POA: Diagnosis not present

## 2023-12-13 DIAGNOSIS — Z87891 Personal history of nicotine dependence: Secondary | ICD-10-CM | POA: Diagnosis not present

## 2023-12-13 DIAGNOSIS — K317 Polyp of stomach and duodenum: Secondary | ICD-10-CM | POA: Insufficient documentation

## 2023-12-13 DIAGNOSIS — Z79899 Other long term (current) drug therapy: Secondary | ICD-10-CM | POA: Insufficient documentation

## 2023-12-13 DIAGNOSIS — K5900 Constipation, unspecified: Secondary | ICD-10-CM | POA: Insufficient documentation

## 2023-12-13 DIAGNOSIS — M8580 Other specified disorders of bone density and structure, unspecified site: Secondary | ICD-10-CM | POA: Insufficient documentation

## 2023-12-13 DIAGNOSIS — D649 Anemia, unspecified: Secondary | ICD-10-CM | POA: Insufficient documentation

## 2023-12-13 LAB — CBC WITH DIFFERENTIAL/PLATELET
Abs Immature Granulocytes: 0.02 10*3/uL (ref 0.00–0.07)
Basophils Absolute: 0 10*3/uL (ref 0.0–0.1)
Basophils Relative: 1 %
Eosinophils Absolute: 0.1 10*3/uL (ref 0.0–0.5)
Eosinophils Relative: 1 %
HCT: 36.2 % (ref 36.0–46.0)
Hemoglobin: 11.3 g/dL — ABNORMAL LOW (ref 12.0–15.0)
Immature Granulocytes: 0 %
Lymphocytes Relative: 31 %
Lymphs Abs: 1.8 10*3/uL (ref 0.7–4.0)
MCH: 27.8 pg (ref 26.0–34.0)
MCHC: 31.2 g/dL (ref 30.0–36.0)
MCV: 89.2 fL (ref 80.0–100.0)
Monocytes Absolute: 0.4 10*3/uL (ref 0.1–1.0)
Monocytes Relative: 7 %
Neutro Abs: 3.6 10*3/uL (ref 1.7–7.7)
Neutrophils Relative %: 60 %
Platelets: 247 10*3/uL (ref 150–400)
RBC: 4.06 MIL/uL (ref 3.87–5.11)
RDW: 12.3 % (ref 11.5–15.5)
WBC: 6 10*3/uL (ref 4.0–10.5)
nRBC: 0 % (ref 0.0–0.2)

## 2023-12-13 LAB — RETICULOCYTES
Immature Retic Fract: 5.5 % (ref 2.3–15.9)
RBC.: 4.01 MIL/uL (ref 3.87–5.11)
Retic Count, Absolute: 65 10*3/uL (ref 19.0–186.0)
Retic Ct Pct: 1.6 % (ref 0.4–3.1)

## 2023-12-13 LAB — IRON AND TIBC
Iron: 68 ug/dL (ref 28–170)
Saturation Ratios: 23 % (ref 10.4–31.8)
TIBC: 296 ug/dL (ref 250–450)
UIBC: 228 ug/dL

## 2023-12-13 LAB — DIRECT ANTIGLOBULIN TEST (NOT AT ARMC)
DAT, IgG: NEGATIVE
DAT, complement: NEGATIVE

## 2023-12-13 LAB — FERRITIN: Ferritin: 70 ng/mL (ref 11–307)

## 2023-12-13 LAB — LACTATE DEHYDROGENASE: LDH: 116 U/L (ref 98–192)

## 2023-12-13 NOTE — Patient Instructions (Signed)
You were seen and examined today by Dr. Ellin Saba. Dr. Ellin Saba is a hematologist, meaning that he specializes in blood abnormalities. Dr. Ellin Saba discussed your past medical history, family history of cancers/blood conditions and the events that led to you being here today.  You were referred to Dr. Ellin Saba due to anemia (low hemoglobin).  Dr. Ellin Saba has recommended additional labs today for further evaluation.  Follow-up as scheduled.

## 2023-12-13 NOTE — Therapy (Signed)
[facility] Outpatient Rehabilitation at [facility] [address] [place], [place], [address] Phone: [phone]   Fax:  [phone]  Patient Details  Name: Laura [name] MRN: [id number] Date of Birth: [birth year] Referring Provider:  No ref. provider found  Encounter Date: 12/13/2023  PHYSICAL THERAPY DISCHARGE SUMMARY  Patient came to the clinic today saying that she cannot finish her PT because she just had a procedure done.   Visits from Start of Care: 5  Current functional level related to goals / functional outcomes: See previous note   Remaining deficits: See previous note   Education / Equipment: See previous note   Patient agrees to discharge. Patient goals were  not assessed due to patient's request to be d/c from PT and not able to return for a full session since the last visit . Patient is being discharged due to the patient's request.   Iantha Fallen L. Astin Rape, PT, DPT, OCS Board-Certified Clinical Specialist in Orthopedic PT PT Compact Privilege # ([place]): [id number]  12/13/2023, 9:05 AM  [facility] Outpatient Rehabilitation at [facility] [address] [place], [place], [address] Phone: [phone]   Fax:  [phone]

## 2023-12-13 NOTE — Progress Notes (Signed)
[facility] [address] [place] [address]   Clinic Day:  12/13/2023  Referring physician: Kerri Perches, MD  Patient Care Team: Kerri Perches, MD as PCP - General Fransico Him Denman George, MD as Consulting Physician (Endocrinology) [name], DO as Consulting Physician (Internal Medicine)   ASSESSMENT & PLAN:   Assessment:  1.  Normocytic anemia: - Patient seen at the request of Dr. Meridee Score. - CBC (11/12/2020): Hb-10.6, MCV-89, A21-3086, folic acid-17.3 - EGD (04/20/2023): For dyspepsia: Gastritis and duodenal polyp. - Small bowel enteroscopy (12/11/2023): Polyp in the third/fourth part of duodenum, consistent with adenoma. - Colonoscopy (03/15/2021): Nonbleeding internal hemorrhoids, diverticulosis in the sigmoid and descending colon - Denies BRBPR/melena.  No prior history of transfusion.  She took many years ago which caused constipation.  No ice pica.  2.  Social/family history: - Lives at home with her husband and is independent of ADLs and IADLs.  Non-smoker.  No family history of anemia.  Maternal aunt has multiple myeloma.  Plan:  1.  Normocytic anemia: - Will repeat CBC today and check for nutritional deficiencies including ferritin, iron panel, MMA, copper and for hemolysis with LDH, reticulocyte count and direct Coombs test.  Will also check for bone marrow infiltrative process. - RTC 1 to 2 weeks for follow-up.   Orders Placed This Encounter  Procedures   CBC with Differential    Standing Status:   Future    Number of Occurrences:   1    Expected Date:   12/13/2023    Expiration Date:   12/12/2024   Lactate dehydrogenase    Standing Status:   Future    Number of Occurrences:   1    Expected Date:   12/13/2023    Expiration Date:   12/12/2024   Reticulocytes    Standing Status:   Future    Number of Occurrences:   1    Expected Date:   12/13/2023    Expiration Date:   12/12/2024   Iron and TIBC ([facility] ONLY)    Standing Status:   Future    Number of Occurrences:   1    Expected Date:   12/13/2023    Expiration Date:   12/12/2024   Ferritin    Standing Status:   Future    Number of Occurrences:   1    Expected Date:   12/13/2023    Expiration Date:   12/12/2024   Methylmalonic acid, serum    Standing Status:   Future    Number of Occurrences:   1    Expected Date:   12/13/2023    Expiration Date:   12/12/2024   Copper, serum    Standing Status:   Future    Number of Occurrences:   1    Expected Date:   12/13/2023    Expiration Date:   12/12/2024   Kappa/lambda light chains    Standing Status:   Future    Number of Occurrences:   1    Expected Date:   12/13/2023    Expiration Date:   12/12/2024   Immunofixation electrophoresis    Standing Status:   Future    Number of Occurrences:   1    Expected Date:   12/13/2023    Expiration Date:   12/12/2024   Protein electrophoresis, serum    Standing Status:   Future    Number of Occurrences:   1    Expected  Date:   12/13/2023    Expiration Date:   12/12/2024   Direct antiglobulin test    Standing Status:   Future    Number of Occurrences:   1    Expected Date:   12/13/2023    Expiration Date:   12/12/2024        Doreatha Massed, MD   2/12/20258:43 AM  CHIEF COMPLAINT/PURPOSE OF CONSULT:   Diagnosis: Normocytic anemia  Current Therapy: Under workup  HISTORY OF PRESENT ILLNESS:   Laura [name] is a 73 y.o. female presenting to clinic today for evaluation of normocytic anemia at the request of Dr. Meridee Score.  Today, she states that she is doing well overall. Her appetite level is at 10%. Her energy level is at 80%.   PAST MEDICAL HISTORY:   Past Medical History: Past Medical History:  Diagnosis Date   Anxiety    At low risk for fall 08/15/2018   Cystitis    Depression    Gastritis    GERD (gastroesophageal reflux disease)    Hashimoto's thyroiditis    Hx    Hyperlipidemia    Hypertension     Hypothyroidism    Normocytic anemia 2009 Hb 9.9-11.1   Osteopenia    Shoulder pain    Urinary incontinence     Surgical History: Past Surgical History:  Procedure Laterality Date   Bilateral tubal ligation     BIOPSY  04/21/2020   Procedure: BIOPSY;  Surgeon: Corbin Ade, MD;  [facility];  Service: Endoscopy;;   BIOPSY  04/20/2023   Procedure: BIOPSY;  Surgeon: [name], DO;  [facility];  Service: Endoscopy;;   BREAST EXCISIONAL BIOPSY  2010   Excisional biopsy of benign left breast mass -lopoma    COLONOSCOPY  08/26/2011   SLF: 1. Internal hemorrhoids   COLONOSCOPY N/A 10/06/2015   ZOX:WRUE fissure or internal hemorrhoids/mild sigmoid colitis. benign colonic path    COLONOSCOPY WITH PROPOFOL N/A 03/15/2021   Procedure: COLONOSCOPY WITH PROPOFOL;  Surgeon: [name], DO;  [facility];  Service: Endoscopy;  Laterality: N/A;  3:00pm   colonscopy  2005   Dr. Katrinka Blazing   ENDOSCOPIC MUCOSAL RESECTION N/A 12/11/2023   Procedure: ENDOSCOPIC MUCOSAL RESECTION;  Surgeon: Meridee Score Netty Starring., MD;  Location: [facility];  Service: Gastroenterology;  Laterality: N/A;   ENTEROSCOPY N/A 07/25/2023   Procedure: ENTEROSCOPY;  Surgeon: [name], DO;  [facility];  Service: Endoscopy;  Laterality: N/A;  1:45 pm, asa 2   ENTEROSCOPY N/A 12/11/2023   Procedure: ENTEROSCOPY;  Surgeon: Meridee Score Netty Starring., MD;  Location: [facility];  Service: Gastroenterology;  Laterality: N/A;   ESOPHAGOGASTRODUODENOSCOPY  12/2009   chronic gastritis   ESOPHAGOGASTRODUODENOSCOPY N/A 05/17/2017   Dr. Darrick Penna; gastritis, normal small bowel biopsies, multiple gastric polyps. fundic gland polyps   ESOPHAGOGASTRODUODENOSCOPY (EGD) WITH PROPOFOL N/A 04/21/2020   Rourk: multiple gastric polyps, biopsy of one c/w fundic gland. gastric biopsy showed mild chronic gastritis but no H.pylori   ESOPHAGOGASTRODUODENOSCOPY (EGD) WITH PROPOFOL N/A  04/20/2023   Procedure: ESOPHAGOGASTRODUODENOSCOPY (EGD) WITH PROPOFOL;  Surgeon: [name], DO;  [facility];  Service: Endoscopy;  Laterality: N/A;  1:00 pm, asa 3, pt knows to arrive at 6:30   GIVENS CAPSULE STUDY N/A 05/30/2017   gastritis, no source for anemia identified   HEMOSTASIS CLIP PLACEMENT  12/11/2023   Procedure: HEMOSTASIS CLIP PLACEMENT;  Surgeon: Lemar Lofty., MD;  Location: [facility];  Service: Gastroenterology;;  HEMOSTASIS CONTROL  12/11/2023   Procedure: HEMOSTASIS CONTROL;  Surgeon: Meridee Score Netty Starring., MD;  Location: [facility];  Service: Gastroenterology;;   HOT HEMOSTASIS N/A 12/11/2023   Procedure: HOT HEMOSTASIS (ARGON PLASMA COAGULATION/BICAP);  Surgeon: Lemar Lofty., MD;  Location: [facility];  Service: Gastroenterology;  Laterality: N/A;   left knee surgery Left 12/30/2015   PARTIAL KNEE ARTHROPLASTY Left 08/09/2017   Procedure: LEFT UNICOMPARTMENTAL KNEE;  Surgeon: Ollen Gross, MD;  Location: [facility];  Service: Orthopedics;  Laterality: Left;   POLYPECTOMY  04/21/2020   Procedure: POLYPECTOMY;  Surgeon: Corbin Ade, MD;  [facility];  Service: Endoscopy;;  gastric   Resection of left lobe of thyroid     right carpal tunnel release     rt. knee athroscopy  2004   SAVORY DILATION N/A 05/17/2017   Procedure: SAVORY DILATION;  Surgeon: West Bali, MD;  [facility];  Service: Endoscopy;  Laterality: N/A;   SUBMUCOSAL LIFTING INJECTION  12/11/2023   Procedure: SUBMUCOSAL LIFTING INJECTION;  Surgeon: Meridee Score Netty Starring., MD;  Location: [facility];  Service: Gastroenterology;;   SUBMUCOSAL TATTOO INJECTION  12/11/2023   Procedure: SUBMUCOSAL TATTOO INJECTION;  Surgeon: Lemar Lofty., MD;  Location: [facility];  Service: Gastroenterology;;   TOTAL ABDOMINAL HYSTERECTOMY  1994   UMBILICAL HERNIA REPAIR     Urethral dilation for stenosis  2009    Social  History: Social History   Socioeconomic History   Marital status: Married    Spouse name: Not on file   Number of children: 3   Years of education: Not on file   Highest education level: Not on file  Occupational History   Occupation: Designer, 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    Occupation: retired  Tobacco Use   Smoking status: Former    Current packs/day: 0.00    Average packs/day: 0.3 packs/day for 1 year (0.3 ttl pk-yrs)    Types: Cigarettes    Start date: 08/26/1971    Quit date: 08/25/1972    Years since quitting: 51.3    Passive exposure: Past   Smokeless tobacco: Never  Vaping Use   Vaping status: Never Used  Substance and Sexual Activity   Alcohol use: No   Drug use: No   Sexual activity: Not Currently    Birth control/protection: Surgical    Comment: hyst  Other Topics Concern   Not on file  Social History Narrative   Not on file   Social Drivers of Health   Financial Resource Strain: Low Risk  (09/27/2023)   Overall Financial Resource Strain (CARDIA)    Difficulty of Paying Living Expenses: Not hard at all  Food Insecurity: No Food Insecurity (09/27/2023)   Hunger Vital Sign    Worried About Running Out of Food in the Last Year: Never true    Ran Out of Food in the Last Year: Never true  Transportation Needs: No Transportation Needs (09/27/2023)   PRAPARE - Administrator, Civil Service (Medical): No    Lack of Transportation (Non-Medical): No  Physical Activity: Inactive (09/27/2023)   Exercise Vital Sign    Days of Exercise per Week: 0 days    Minutes of Exercise per Session: 0 min  Stress: Stress Concern Present (09/27/2023)   [employer] of Occupational Health - Occupational Stress Questionnaire    Feeling of Stress : To some extent  Social Connections: Moderately Integrated (09/27/2023)   Social Connection and Isolation Panel [NHANES]    Frequency  of Communication with Friends and Family: Three times a week    Frequency of  Social Gatherings with Friends and Family: Three times a week    Attends Religious Services: More than 4 times per year    Active Member of Clubs or Organizations: No    Attends Banker Meetings: Never    Marital Status: Married  Catering manager Violence: Not At Risk (09/27/2023)   Humiliation, Afraid, Rape, and Kick questionnaire    Fear of Current or Ex-Partner: No    Emotionally Abused: No    Physically Abused: No    Sexually Abused: No    Family History: Family History  Problem Relation Age of Onset   Pneumonia Mother    Healthy Father    Colon cancer Neg Hx    Liver disease Neg Hx    Esophageal cancer Neg Hx    Inflammatory bowel disease Neg Hx    Pancreatic cancer Neg Hx    Rectal cancer Neg Hx    Stomach cancer Neg Hx     Current Medications:  Current Outpatient Medications:    acetaminophen (TYLENOL) 500 MG tablet, Take 1 tablet (500 mg total) by mouth every 6 (six) hours as needed., Disp: 30 tablet, Rfl: 0   amLODipine (NORVASC) 5 MG tablet, TAKE 1 TABLET BY MOUTH DAILY, Disp: 100 tablet, Rfl: 2   atorvastatin (LIPITOR) 80 MG tablet, TAKE 1 TABLET BY MOUTH ONCE  DAILY, Disp: 90 tablet, Rfl: 3   buPROPion (WELLBUTRIN XL) 150 MG 24 hr tablet, Take one tablet by mouth once daily, Disp: 30 tablet, Rfl: 5   Calcium Carbonate-Vit D-Min (CALCIUM 1200) 1200-1000 MG-UNIT CHEW, Take one tablet by mouth two times daily, Disp: 60 tablet, Rfl: 5   citalopram (CELEXA) 40 MG tablet, TAKE 1 TABLET BY MOUTH DAILY, Disp: 90 tablet, Rfl: 3   cyclobenzaprine (FLEXERIL) 10 MG tablet, Take 1 tablet (10 mg total) by mouth 2 (two) times daily as needed., Disp: 20 tablet, Rfl: 0   fluticasone (FLONASE) 50 MCG/ACT nasal spray, Place 1 spray into both nostrils daily as needed for allergies or rhinitis., Disp: , Rfl:    furosemide (LASIX) 20 MG tablet, TAKE 1 TABLET(20 MG) BY MOUTH DAILY, Disp: 30 tablet, Rfl: 1   gabapentin (NEURONTIN) 100 MG capsule, TAKE 2 CAPSULES BY MOUTH AT   BEDTIME FOR NEUROPATHIC PAIN, Disp: 200 capsule, Rfl: 2   hydrochlorothiazide (HYDRODIURIL) 25 MG tablet, Take 1 tablet (25 mg total) by mouth daily., Disp: 90 tablet, Rfl: 0   levocetirizine (XYZAL) 5 MG tablet, Take 1 tablet (5 mg total) by mouth every evening., Disp: 30 tablet, Rfl: 3   levothyroxine (SYNTHROID) 88 MCG tablet, TAKE 1 TABLET BY MOUTH DAILY  BEFORE BREAKFAST, Disp: 90 tablet, Rfl: 1   Multiple Vitamin (MULTIVITAMIN WITH MINERALS) TABS tablet, Take 1 tablet by mouth daily., Disp: , Rfl:    pantoprazole (PROTONIX) 40 MG tablet, Take 1 tablet (40 mg total) by mouth 2 (two) times daily before a meal., Disp: 60 tablet, Rfl: 0   pantoprazole (PROTONIX) 40 MG tablet, Take 1 tablet (40 mg total) by mouth daily., Disp: 30 tablet, Rfl: 1   Polyethyl Glycol-Propyl Glycol (SYSTANE OP), Place 1 drop into both eyes daily as needed (dry eyes)., Disp: , Rfl:    potassium chloride (KLOR-CON) 10 MEQ tablet, Take one tablet by mouth once daily, Disp: 30 tablet, Rfl: 5   rosuvastatin (CRESTOR) 40 MG tablet, Take 1 tablet (40 mg total) by mouth daily., Disp:  90 tablet, Rfl: 0   sucralfate (CARAFATE) 1 g tablet, Take 1 tablet (1 g total) by mouth 2 (two) times daily., Disp: 28 tablet, Rfl: 0   [Paused] pantoprazole (PROTONIX) 40 MG tablet, Take 1 tablet (40 mg total) by mouth daily. (Patient not taking: Reported on 12/13/2023), Disp: 30 tablet, Rfl: 1  Current Facility-Administered Medications:    lidocaine (PF) (XYLOCAINE) 1 % injection 4 mL, 4 mL, Other, Once, Barbaraann Faster Gasper Lloyd, MD   Allergies: Allergies  Allergen Reactions   Cozaar [Losartan Potassium] Cough   Dilaudid [Hydromorphone Hcl] Other (See Comments)    Confusion;altered state of mind   Levaquin [Levofloxacin] Other (See Comments)    Dry throat   Nsaids Other (See Comments)    per GI pt no longer to use NSAIDS    Promethazine     Legs itching, RESTLESS LEGS    REVIEW OF SYSTEMS:   Review of Systems  Neurological:  Positive for  numbness.  Psychiatric/Behavioral:  The patient is nervous/anxious.   All other systems reviewed and are negative.    VITALS:   Blood pressure 129/67, pulse 63, temperature (!) 97 F (36.1 C), temperature source Tympanic, resp. rate 18, height 5\' 3"  (1.6 m), weight 166 lb (75.3 kg), SpO2 100%.  Wt Readings from Last 3 Encounters:  12/13/23 166 lb (75.3 kg)  12/11/23 167 lb (75.8 kg)  12/08/23 167 lb (75.8 kg)    Body mass index is 29.41 kg/m.   PHYSICAL EXAM:   Physical Exam Vitals reviewed.  Constitutional:      Appearance: Normal appearance.  Cardiovascular:     Rate and Rhythm: Normal rate and regular rhythm.     Heart sounds: Normal heart sounds.  Pulmonary:     Effort: Pulmonary effort is normal.     Breath sounds: Normal breath sounds.  Abdominal:     General: There is no distension.     Palpations: Abdomen is soft. There is no mass.  Musculoskeletal:     Right lower leg: No edema.     Left lower leg: No edema.  Neurological:     Mental Status: She is alert.  Psychiatric:        Mood and Affect: Mood normal.        Behavior: Behavior normal.     LABS:   CBC    Component Value Date/Time   WBC 7.2 12/08/2023 1103   RBC 4.11 12/08/2023 1103   HGB 11.6 (L) 12/08/2023 1103   HGB 10.6 (L) 11/13/2023 0829   HCT 35.9 (L) 12/08/2023 1103   HCT 34.3 11/13/2023 0829   PLT 295.0 12/08/2023 1103   PLT 302 11/13/2023 0829   MCV 87.2 12/08/2023 1103   MCV 89 11/13/2023 0829   MCH 27.6 11/13/2023 0829   MCH 28.2 11/01/2023 1051   MCHC 32.3 12/08/2023 1103   RDW 12.8 12/08/2023 1103   RDW 11.9 11/13/2023 0829   LYMPHSABS 1.5 11/13/2023 0829   MONOABS 0.4 11/01/2023 1051   EOSABS 0.1 11/13/2023 0829   BASOSABS 0.1 11/13/2023 0829    CMP    Component Value Date/Time   NA 140 12/08/2023 1103   NA 141 11/13/2023 0829   K 2.9 (L) 12/08/2023 1103   CL 98 12/08/2023 1103   CO2 32 12/08/2023 1103   GLUCOSE 87 12/08/2023 1103   BUN 15 12/08/2023 1103   BUN  11 11/13/2023 0829   CREATININE 0.94 12/08/2023 1103   CREATININE 0.95 03/11/2020 0944   CALCIUM 9.4 12/08/2023  1103   PROT 6.8 11/13/2023 0829   ALBUMIN 4.0 11/13/2023 0829   AST 15 11/13/2023 0829   ALT 14 11/13/2023 0829   ALKPHOS 82 11/13/2023 0829   BILITOT 0.4 11/13/2023 0829   GFRNONAA >60 11/01/2023 1051   GFRNONAA 61 03/11/2020 0944   GFRAA 70 08/25/2020 1015   GFRAA 71 03/11/2020 0944    No results found for: "CEA1", "CEA" / No results found for: "CEA1", "CEA" No results found for: "PSA1" No results found for: "CAN199" No results found for: "CAN125"  Lab Results  Component Value Date   TOTALPROTELP 7.4 01/16/2018   ALBUMINELP 3.8 01/16/2018   A1GS 0.2 01/16/2018   A2GS 0.7 01/16/2018   BETS 1.1 01/16/2018   GAMS 1.6 01/16/2018   MSPIKE Not Observed 01/16/2018   SPEI Comment 01/16/2018   Lab Results  Component Value Date   TIBC 258 04/14/2020   TIBC 235 (L) 01/09/2018   TIBC 226 (L) 12/21/2017   FERRITIN 112 06/15/2023   FERRITIN 133 11/08/2022   FERRITIN 129 04/14/2020   IRONPCTSAT 20 04/14/2020   IRONPCTSAT 26 01/09/2018   IRONPCTSAT 35 12/21/2017   No results found for: "LDH"   STUDIES:   No results found.

## 2023-12-14 ENCOUNTER — Encounter (HOSPITAL_COMMUNITY): Payer: Medicare Other

## 2023-12-14 LAB — KAPPA/LAMBDA LIGHT CHAINS
Kappa free light chain: 19.3 mg/L (ref 3.3–19.4)
Kappa, lambda light chain ratio: 1.42 (ref 0.26–1.65)
Lambda free light chains: 13.6 mg/L (ref 5.7–26.3)

## 2023-12-14 NOTE — Anesthesia Postprocedure Evaluation (Signed)
Anesthesia Post Note  Patient: Laura [name]  Procedure(s) Performed: ENTEROSCOPY ENDOSCOPIC MUCOSAL RESECTION SUBMUCOSAL LIFTING INJECTION SUBMUCOSAL TATTOO INJECTION HEMOSTASIS CONTROL HEMOSTASIS CLIP PLACEMENT HOT HEMOSTASIS (ARGON PLASMA COAGULATION/BICAP)     Patient location during evaluation: Endoscopy Anesthesia Type: MAC Level of consciousness: awake and alert Pain management: pain level controlled Vital Signs Assessment: post-procedure vital signs reviewed and stable Respiratory status: spontaneous breathing, nonlabored ventilation, respiratory function stable and patient connected to nasal cannula oxygen Cardiovascular status: stable and blood pressure returned to baseline Postop Assessment: no apparent nausea or vomiting Anesthetic complications: no   No notable events documented.  Last Vitals:  Vitals:   12/11/23 1435 12/11/23 1440  BP:  (!) 112/51  Pulse: (!) 53 (!) 51  Resp: 16 20  Temp:    SpO2: 92% 97%    Last Pain:  Vitals:   12/11/23 1440  TempSrc:   PainSc: 0-No pain                 Philisha Weinel S

## 2023-12-15 ENCOUNTER — Encounter: Payer: Self-pay | Admitting: Gastroenterology

## 2023-12-15 LAB — PROTEIN ELECTROPHORESIS, SERUM
A/G Ratio: 1.1 (ref 0.7–1.7)
Albumin ELP: 3.6 g/dL (ref 2.9–4.4)
Alpha-1-Globulin: 0.2 g/dL (ref 0.0–0.4)
Alpha-2-Globulin: 0.8 g/dL (ref 0.4–1.0)
Beta Globulin: 0.9 g/dL (ref 0.7–1.3)
Gamma Globulin: 1.3 g/dL (ref 0.4–1.8)
Globulin, Total: 3.3 g/dL (ref 2.2–3.9)
Total Protein ELP: 6.9 g/dL (ref 6.0–8.5)

## 2023-12-15 LAB — COPPER, SERUM: Copper: 89 ug/dL (ref 80–158)

## 2023-12-16 LAB — METHYLMALONIC ACID, SERUM: Methylmalonic Acid, Quantitative: 116 nmol/L (ref 0–378)

## 2023-12-18 LAB — IMMUNOFIXATION ELECTROPHORESIS
IgA: 219 mg/dL (ref 64–422)
IgG (Immunoglobin G), Serum: 1393 mg/dL (ref 586–1602)
IgM (Immunoglobulin M), Srm: 114 mg/dL (ref 26–217)
Total Protein ELP: 6.7 g/dL (ref 6.0–8.5)

## 2023-12-19 ENCOUNTER — Encounter (HOSPITAL_COMMUNITY): Payer: Medicare Other

## 2023-12-21 ENCOUNTER — Encounter (HOSPITAL_COMMUNITY): Payer: Medicare Other

## 2023-12-22 DIAGNOSIS — R5382 Chronic fatigue, unspecified: Secondary | ICD-10-CM | POA: Diagnosis not present

## 2023-12-22 DIAGNOSIS — R7303 Prediabetes: Secondary | ICD-10-CM | POA: Diagnosis not present

## 2023-12-22 DIAGNOSIS — D649 Anemia, unspecified: Secondary | ICD-10-CM | POA: Diagnosis not present

## 2023-12-23 LAB — VITAMIN B12: Vitamin B-12: 1043 pg/mL (ref 232–1245)

## 2023-12-23 LAB — IRON: Iron: 66 ug/dL (ref 27–139)

## 2023-12-23 LAB — FOLATE: Folate: 14.5 ng/mL (ref >3.0)

## 2023-12-23 LAB — HEMOGLOBIN A1C
Est. average glucose Bld gHb Est-mCnc: 111 mg/dL
Hgb A1c MFr Bld: 5.5 % (ref 4.8–5.6)

## 2023-12-24 ENCOUNTER — Encounter: Payer: Self-pay | Admitting: Family Medicine

## 2023-12-26 MED ORDER — OMEPRAZOLE 20 MG PO CPDR
20.0000 mg | DELAYED_RELEASE_CAPSULE | Freq: Two times a day (BID) | ORAL | 3 refills | Status: DC
Start: 2023-12-26 — End: 2024-07-02

## 2024-01-01 NOTE — Progress Notes (Unsigned)
 [facility] [address] [place], [place] [address]   CLINIC:  Medical Oncology/Hematology  PCP:  Kerri Perches, MD [address], [address] [place] [address] [phone]   REASON FOR VISIT:  Follow-up for normocytic anemia  CURRENT THERAPY: Under workup  INTERVAL HISTORY:   Laura [name] 73 y.o. female returns for routine follow-up of normocytic anemia.  She was seen for initial consultation by Dr. Ellin Saba on 12/13/2023.  At today's visit, she reports feeling very well.  She denies any interval changes since her office visit 3 weeks ago.  Patient reports that she remembers being told that she was anemic even as a young woman.  She denies any rectal bleeding or melena.  No abnormal fatigue, pica, headaches, lightheadedness, syncope, chest pain, or dyspnea on exertion.  She has 100% energy and 100% appetite. She endorses that she is maintaining a stable weight.  ASSESSMENT & PLAN:  1.  Normocytic anemia: - Patient seen at the request of Dr. Meridee Score due to labs from [facility] ([facility] on 11/13/2023 showing Hgb 10.6/MCV 89 - Review of past labs (via [facility]) shows mild anemia since at least 2015, with baseline around Hgb 10 to 11 g/dL range - Previously worked up at [facility] by Dr. Janyth Contes / Dr. Melton Alar.  Prior workup notable for the following: Full GI workup including EGD, colonoscopy, capsule endoscopy was negative for bleeding Hemoglobin electrophoresis was normal. No etiology of anemia was found. - Most recent workup (January/February 2025) Normal B12 and folate (labs via PCP and [facility], 12/22/2023).  Normal MMA and copper. Normal creatinine 0.96 ([facility] 11/13/2023) No evidence of hemolysis (normal DAT, reticulocytes, LDH) Marginal iron deficiency with ferritin 70, iron saturation 23%. Normal immunofixation, SPEP, light chains. - EGD (04/20/2023): For dyspepsia: Gastritis and duodenal polyp. - Small bowel enteroscopy (12/11/2023): Polyp in the  third/fourth part of duodenum, consistent with adenoma. - Colonoscopy (03/15/2021): Nonbleeding internal hemorrhoids, diverticulosis in the sigmoid and descending colon - No family history of anemia.  Maternal aunt has multiple myeloma. - No prior history of transfusion.  She took many years ago which caused constipation. - Denies BRBPR/melena.  No fatigue.   No ice pica. - PLAN: No clear etiology of anemia identified.  Long-term stability of anemia is reassuring. - Question that she may have some underlying alpha thalassemia trait, as this can sometimes result in mild normocytic anemia.  We will check alpha thalassemia genotyping. - Patient will RTC for office visit in 2 months to discuss results and next steps.  (If alpha thalassemia genotype is negative, could consider trial of single dose of IV iron to see if that improves her hemoglobin.)  2.  Social/family history: - Lives at home with her husband and is independent of ADLs and IADLs.  Non-smoker.  No family history of anemia.  Maternal aunt has multiple myeloma.  PLAN SUMMARY: >> Labs today = alpha thalassemia genotype >> OFFICE visit in 2 months     REVIEW OF SYSTEMS: No complaints.  Review of Systems  Constitutional:  Negative for appetite change, chills, diaphoresis, fatigue, fever and unexpected weight change.  HENT:   Negative for lump/mass and nosebleeds.   Eyes:  Negative for eye problems.  Respiratory:  Negative for cough, hemoptysis and shortness of breath.   Cardiovascular:  Negative for chest pain, leg swelling and palpitations.  Gastrointestinal:  Negative for abdominal pain, blood in stool, constipation, diarrhea, nausea and vomiting.  Genitourinary:  Negative for hematuria.   Skin: Negative.  Neurological:  Negative for dizziness, headaches and light-headedness.  Hematological:  Does not bruise/bleed easily.     PHYSICAL EXAM:  ECOG PERFORMANCE STATUS: 0 - Asymptomatic  Vitals:   01/02/24 0807  BP: 128/65   Pulse: 63  Resp: 18  Temp: 97.9 F (36.6 C)  SpO2: 100%   Filed Weights   01/02/24 0807  Weight: 172 lb (78 kg)   Physical Exam Constitutional:      Appearance: Normal appearance. She is obese.  Cardiovascular:     Heart sounds: Normal heart sounds.  Pulmonary:     Breath sounds: Normal breath sounds.  Neurological:     General: No focal deficit present.     Mental Status: Mental status is at baseline.  Psychiatric:        Behavior: Behavior normal. Behavior is cooperative.     PAST MEDICAL/SURGICAL HISTORY:  Past Medical History:  Diagnosis Date   Anxiety    At low risk for fall 08/15/2018   Cystitis    Depression    Gastritis    GERD (gastroesophageal reflux disease)    Hashimoto's thyroiditis    Hx    Hyperlipidemia    Hypertension    Hypothyroidism    Normocytic anemia 2009 Hb 9.9-11.1   Osteopenia    Shoulder pain    Urinary incontinence    Past Surgical History:  Procedure Laterality Date   Bilateral tubal ligation     BIOPSY  04/21/2020   Procedure: BIOPSY;  Surgeon: Corbin Ade, MD;  [facility];  Service: Endoscopy;;   BIOPSY  04/20/2023   Procedure: BIOPSY;  Surgeon: [name], DO;  [facility];  Service: Endoscopy;;   BREAST EXCISIONAL BIOPSY  2010   Excisional biopsy of benign left breast mass -lopoma    COLONOSCOPY  08/26/2011   SLF: 1. Internal hemorrhoids   COLONOSCOPY N/A 10/06/2015   JKK:XFGH fissure or internal hemorrhoids/mild sigmoid colitis. benign colonic path    COLONOSCOPY WITH PROPOFOL N/A 03/15/2021   Procedure: COLONOSCOPY WITH PROPOFOL;  Surgeon: [name], DO;  [facility];  Service: Endoscopy;  Laterality: N/A;  3:00pm   colonscopy  2005   Dr. Katrinka Blazing   ENDOSCOPIC MUCOSAL RESECTION N/A 12/11/2023   Procedure: ENDOSCOPIC MUCOSAL RESECTION;  Surgeon: Meridee Score Netty Starring., MD;  Location: [facility];  Service: Gastroenterology;  Laterality: N/A;   ENTEROSCOPY N/A  07/25/2023   Procedure: ENTEROSCOPY;  Surgeon: [name], DO;  [facility];  Service: Endoscopy;  Laterality: N/A;  1:45 pm, asa 2   ENTEROSCOPY N/A 12/11/2023   Procedure: ENTEROSCOPY;  Surgeon: Meridee Score Netty Starring., MD;  Location: [facility];  Service: Gastroenterology;  Laterality: N/A;   ESOPHAGOGASTRODUODENOSCOPY  12/2009   chronic gastritis   ESOPHAGOGASTRODUODENOSCOPY N/A 05/17/2017   Dr. Darrick Penna; gastritis, normal small bowel biopsies, multiple gastric polyps. fundic gland polyps   ESOPHAGOGASTRODUODENOSCOPY (EGD) WITH PROPOFOL N/A 04/21/2020   Rourk: multiple gastric polyps, biopsy of one c/w fundic gland. gastric biopsy showed mild chronic gastritis but no H.pylori   ESOPHAGOGASTRODUODENOSCOPY (EGD) WITH PROPOFOL N/A 04/20/2023   Procedure: ESOPHAGOGASTRODUODENOSCOPY (EGD) WITH PROPOFOL;  Surgeon: [name], DO;  [facility];  Service: Endoscopy;  Laterality: N/A;  1:00 pm, asa 3, pt knows to arrive at 6:30   GIVENS CAPSULE STUDY N/A 05/30/2017   gastritis, no source for anemia identified   HEMOSTASIS CLIP PLACEMENT  12/11/2023   Procedure: HEMOSTASIS CLIP PLACEMENT;  Surgeon: Lemar Lofty., MD;  Location: [facility];  Service: Gastroenterology;;   HEMOSTASIS CONTROL  12/11/2023   Procedure: HEMOSTASIS CONTROL;  Surgeon: Lemar Lofty., MD;  Location: [facility];  Service: Gastroenterology;;   HOT HEMOSTASIS N/A 12/11/2023   Procedure: HOT HEMOSTASIS (ARGON PLASMA COAGULATION/BICAP);  Surgeon: Lemar Lofty., MD;  Location: [facility];  Service: Gastroenterology;  Laterality: N/A;   left knee surgery Left 12/30/2015   PARTIAL KNEE ARTHROPLASTY Left 08/09/2017   Procedure: LEFT UNICOMPARTMENTAL KNEE;  Surgeon: Ollen Gross, MD;  Location: [facility];  Service: Orthopedics;  Laterality: Left;   POLYPECTOMY  04/21/2020   Procedure: POLYPECTOMY;  Surgeon: Corbin Ade, MD;  [facility];  Service:  Endoscopy;;  gastric   Resection of left lobe of thyroid     right carpal tunnel release     rt. knee athroscopy  2004   SAVORY DILATION N/A 05/17/2017   Procedure: SAVORY DILATION;  Surgeon: West Bali, MD;  [facility];  Service: Endoscopy;  Laterality: N/A;   SUBMUCOSAL LIFTING INJECTION  12/11/2023   Procedure: SUBMUCOSAL LIFTING INJECTION;  Surgeon: Meridee Score Netty Starring., MD;  Location: [facility];  Service: Gastroenterology;;   SUBMUCOSAL TATTOO INJECTION  12/11/2023   Procedure: SUBMUCOSAL TATTOO INJECTION;  Surgeon: Lemar Lofty., MD;  Location: [facility];  Service: Gastroenterology;;   TOTAL ABDOMINAL HYSTERECTOMY  1994   UMBILICAL HERNIA REPAIR     Urethral dilation for stenosis  2009    SOCIAL HISTORY:  Social History   Socioeconomic History   Marital status: Married    Spouse name: Not on file   Number of children: 3   Years of education: Not on file   Highest education level: Not on file  Occupational History   Occupation: Designer, 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    Occupation: retired  Tobacco Use   Smoking status: Former    Current packs/day: 0.00    Average packs/day: 0.3 packs/day for 1 year (0.3 ttl pk-yrs)    Types: Cigarettes    Start date: 08/26/1971    Quit date: 08/25/1972    Years since quitting: 51.3    Passive exposure: Past   Smokeless tobacco: Never  Vaping Use   Vaping status: Never Used  Substance and Sexual Activity   Alcohol use: No   Drug use: No   Sexual activity: Not Currently    Birth control/protection: Surgical    Comment: hyst  Other Topics Concern   Not on file  Social History Narrative   Not on file   Social Drivers of Health   Financial Resource Strain: Low Risk  (09/27/2023)   Overall Financial Resource Strain (CARDIA)    Difficulty of Paying Living Expenses: Not hard at all  Food Insecurity: No Food Insecurity (09/27/2023)   Hunger Vital Sign    Worried About Running Out of Food in the  Last Year: Never true    Ran Out of Food in the Last Year: Never true  Transportation Needs: No Transportation Needs (09/27/2023)   PRAPARE - Administrator, Civil Service (Medical): No    Lack of Transportation (Non-Medical): No  Physical Activity: Inactive (09/27/2023)   Exercise Vital Sign    Days of Exercise per Week: 0 days    Minutes of Exercise per Session: 0 min  Stress: Stress Concern Present (09/27/2023)   [employer] of Occupational Health - Occupational Stress Questionnaire    Feeling of Stress : To some extent  Social Connections: Moderately Integrated (09/27/2023)  Social Connection and Isolation Panel [NHANES]    Frequency of Communication with Friends and Family: Three times a week    Frequency of Social Gatherings with Friends and Family: Three times a week    Attends Religious Services: More than 4 times per year    Active Member of Clubs or Organizations: No    Attends Banker Meetings: Never    Marital Status: Married  Catering manager Violence: Not At Risk (09/27/2023)   Humiliation, Afraid, Rape, and Kick questionnaire    Fear of Current or Ex-Partner: No    Emotionally Abused: No    Physically Abused: No    Sexually Abused: No    FAMILY HISTORY:  Family History  Problem Relation Age of Onset   Pneumonia Mother    Healthy Father    Colon cancer Neg Hx    Liver disease Neg Hx    Esophageal cancer Neg Hx    Inflammatory bowel disease Neg Hx    Pancreatic cancer Neg Hx    Rectal cancer Neg Hx    Stomach cancer Neg Hx     CURRENT MEDICATIONS:  Outpatient Encounter Medications as of 01/02/2024  Medication Sig   acetaminophen (TYLENOL) 500 MG tablet Take 1 tablet (500 mg total) by mouth every 6 (six) hours as needed.   amLODipine (NORVASC) 5 MG tablet TAKE 1 TABLET BY MOUTH DAILY   atorvastatin (LIPITOR) 80 MG tablet TAKE 1 TABLET BY MOUTH ONCE  DAILY   buPROPion (WELLBUTRIN XL) 150 MG 24 hr tablet Take one tablet by mouth  once daily   Calcium Carbonate-Vit D-Min (CALCIUM 1200) 1200-1000 MG-UNIT CHEW Take one tablet by mouth two times daily   citalopram (CELEXA) 40 MG tablet TAKE 1 TABLET BY MOUTH DAILY   cyclobenzaprine (FLEXERIL) 10 MG tablet Take 1 tablet (10 mg total) by mouth 2 (two) times daily as needed.   fluticasone (FLONASE) 50 MCG/ACT nasal spray Place 1 spray into both nostrils daily as needed for allergies or rhinitis.   furosemide (LASIX) 20 MG tablet TAKE 1 TABLET(20 MG) BY MOUTH DAILY   gabapentin (NEURONTIN) 100 MG capsule TAKE 2 CAPSULES BY MOUTH AT  BEDTIME FOR NEUROPATHIC PAIN   hydrochlorothiazide (HYDRODIURIL) 25 MG tablet TAKE 1 TABLET(25 MG) BY MOUTH DAILY   levocetirizine (XYZAL) 5 MG tablet Take 1 tablet (5 mg total) by mouth every evening.   levothyroxine (SYNTHROID) 88 MCG tablet TAKE 1 TABLET BY MOUTH DAILY  BEFORE BREAKFAST   Multiple Vitamin (MULTIVITAMIN WITH MINERALS) TABS tablet Take 1 tablet by mouth daily.   omeprazole (PRILOSEC) 20 MG capsule Take 1 capsule (20 mg total) by mouth 2 (two) times daily before a meal.   [Paused] pantoprazole (PROTONIX) 40 MG tablet Take 1 tablet (40 mg total) by mouth daily.   pantoprazole (PROTONIX) 40 MG tablet Take 1 tablet (40 mg total) by mouth 2 (two) times daily before a meal.   pantoprazole (PROTONIX) 40 MG tablet Take 1 tablet (40 mg total) by mouth daily.   Polyethyl Glycol-Propyl Glycol (SYSTANE OP) Place 1 drop into both eyes daily as needed (dry eyes).   potassium chloride (KLOR-CON) 10 MEQ tablet Take one tablet by mouth once daily   rosuvastatin (CRESTOR) 40 MG tablet Take 1 tablet (40 mg total) by mouth daily.   sucralfate (CARAFATE) 1 g tablet Take 1 tablet (1 g total) by mouth 2 (two) times daily.   Facility-Administered Encounter Medications as of 01/02/2024  Medication   lidocaine (PF) (XYLOCAINE) 1 %  injection 4 mL    ALLERGIES:  Allergies  Allergen Reactions   Cozaar [Losartan Potassium] Cough   Dilaudid [Hydromorphone  Hcl] Other (See Comments)    Confusion;altered state of mind   Levaquin [Levofloxacin] Other (See Comments)    Dry throat   Nsaids Other (See Comments)    per GI pt no longer to use NSAIDS    Promethazine     Legs itching, RESTLESS LEGS    LABORATORY DATA:  I have reviewed the labs as listed.  CBC    Component Value Date/Time   WBC 6.0 12/13/2023 0841   RBC 4.01 12/13/2023 0841   RBC 4.06 12/13/2023 0841   HGB 11.3 (L) 12/13/2023 0841   HGB 10.6 (L) 11/13/2023 0829   HCT 36.2 12/13/2023 0841   HCT 34.3 11/13/2023 0829   PLT 247 12/13/2023 0841   PLT 302 11/13/2023 0829   MCV 89.2 12/13/2023 0841   MCV 89 11/13/2023 0829   MCH 27.8 12/13/2023 0841   MCHC 31.2 12/13/2023 0841   RDW 12.3 12/13/2023 0841   RDW 11.9 11/13/2023 0829   LYMPHSABS 1.8 12/13/2023 0841   LYMPHSABS 1.5 11/13/2023 0829   MONOABS 0.4 12/13/2023 0841   EOSABS 0.1 12/13/2023 0841   EOSABS 0.1 11/13/2023 0829   BASOSABS 0.0 12/13/2023 0841   BASOSABS 0.1 11/13/2023 0829      Latest Ref Rng & Units 12/08/2023   11:03 AM 11/13/2023    8:29 AM 11/01/2023   10:51 AM  CMP  Glucose 70 - 99 mg/dL 87  161  096   BUN 6 - 23 mg/dL 15  11  11    Creatinine 0.40 - 1.20 mg/dL 0.45  4.09  8.11   Sodium 135 - 145 mEq/L 140  141  137   Potassium 3.5 - 5.1 mEq/L 2.9  3.4  2.8   Chloride 96 - 112 mEq/L 98  102  101   CO2 19 - 32 mEq/L 32  23  27   Calcium 8.4 - 10.5 mg/dL 9.4  9.8  9.8   Total Protein 6.0 - 8.5 g/dL  6.8  7.3   Total Bilirubin 0.0 - 1.2 mg/dL  0.4  0.9   Alkaline Phos 44 - 121 IU/L  82  58   AST 0 - 40 IU/L  15  23   ALT 0 - 32 IU/L  14  17     DIAGNOSTIC IMAGING:  I have independently reviewed the relevant imaging and discussed with the patient.   WRAP UP:  All questions were answered. The patient knows to call the clinic with any problems, questions or concerns.  Medical decision making: Moderate  Time spent on visit: I spent 20 minutes counseling the patient face to face. The total  time spent in the appointment was 30 minutes and more than 50% was on counseling.  Carnella Guadalajara, PA-C  01/02/24 8:38 AM

## 2024-01-02 ENCOUNTER — Inpatient Hospital Stay

## 2024-01-02 ENCOUNTER — Inpatient Hospital Stay: Payer: Medicare Other | Attending: Physician Assistant | Admitting: Physician Assistant

## 2024-01-02 VITALS — BP 128/65 | HR 63 | Temp 97.9°F | Resp 18 | Ht 63.0 in | Wt 172.0 lb

## 2024-01-02 DIAGNOSIS — Z87891 Personal history of nicotine dependence: Secondary | ICD-10-CM | POA: Insufficient documentation

## 2024-01-02 DIAGNOSIS — E063 Autoimmune thyroiditis: Secondary | ICD-10-CM | POA: Insufficient documentation

## 2024-01-02 DIAGNOSIS — D649 Anemia, unspecified: Secondary | ICD-10-CM | POA: Diagnosis not present

## 2024-01-02 DIAGNOSIS — Z79899 Other long term (current) drug therapy: Secondary | ICD-10-CM | POA: Diagnosis not present

## 2024-01-02 DIAGNOSIS — K219 Gastro-esophageal reflux disease without esophagitis: Secondary | ICD-10-CM | POA: Diagnosis not present

## 2024-01-02 DIAGNOSIS — K5900 Constipation, unspecified: Secondary | ICD-10-CM | POA: Insufficient documentation

## 2024-01-02 DIAGNOSIS — Z79890 Hormone replacement therapy (postmenopausal): Secondary | ICD-10-CM | POA: Diagnosis not present

## 2024-01-02 DIAGNOSIS — K648 Other hemorrhoids: Secondary | ICD-10-CM | POA: Diagnosis not present

## 2024-01-02 DIAGNOSIS — D563 Thalassemia minor: Secondary | ICD-10-CM | POA: Diagnosis not present

## 2024-01-02 DIAGNOSIS — M129 Arthropathy, unspecified: Secondary | ICD-10-CM | POA: Insufficient documentation

## 2024-01-02 DIAGNOSIS — K5730 Diverticulosis of large intestine without perforation or abscess without bleeding: Secondary | ICD-10-CM | POA: Diagnosis not present

## 2024-01-02 DIAGNOSIS — I10 Essential (primary) hypertension: Secondary | ICD-10-CM | POA: Diagnosis not present

## 2024-01-02 DIAGNOSIS — E785 Hyperlipidemia, unspecified: Secondary | ICD-10-CM | POA: Insufficient documentation

## 2024-01-02 NOTE — Patient Instructions (Signed)
 [facility] **VISIT SUMMARY & IMPORTANT INSTRUCTIONS **   You were seen today by Rojelio Brenner PA-C for your anemia (low hemoglobin/red blood cells).  You have had mild anemia since at least 2009, but likely it has been there for much longer.  I suspect that you may have some underlying genetic abnormality, and will be checking a test today to look for "alpha thalassemia trait."  This is not something that is dangerous to you, but it would explain why you have had chronic mild anemia.  FOLLOW-UP APPOINTMENT: 2 months  ** Thank you for trusting me with your healthcare!  I strive to provide all of my patients with quality care at each visit.  If you receive a survey for this visit, I would be so grateful to you for taking the time to provide feedback.  Thank you in advance!  ~ Orian Amberg                   Dr. Doreatha Massed   &   Rojelio Brenner, PA-C   - - - - - - - - - - - - - - - - - -    Thank you for choosing [facility] to provide your oncology and hematology care.  To afford each patient quality time with our provider, please arrive at least 15 minutes before your scheduled appointment time.   If you have a lab appointment with the Cancer Center please come in thru the Main Entrance and check in at the main information desk.  You need to re-schedule your appointment should you arrive 10 or more minutes late.  We strive to give you quality time with our providers, and arriving late affects you and other patients whose appointments are after yours.  Also, if you no show three or more times for appointments you may be dismissed from the clinic at the providers discretion.     Again, thank you for choosing [facility].  Our hope is that these requests will decrease the amount of time that you wait before being seen by our physicians.        _____________________________________________________________  Should you have questions after your visit to [facility], please contact our office at [phone] and follow the prompts.  Our office hours are 8:00 a.m. and 4:30 p.m. Monday - Friday.  Please note that voicemails left after 4:00 p.m. may not be returned until the following business day.  We are closed weekends and major holidays.  You do have access to a nurse 24-7, just call the main number to the clinic [phone] and do not press any options, hold on the line and a nurse will answer the phone.    For prescription refill requests, have your pharmacy contact our office and allow 72 hours.

## 2024-01-09 ENCOUNTER — Ambulatory Visit (HOSPITAL_COMMUNITY)
Admission: RE | Admit: 2024-01-09 | Discharge: 2024-01-09 | Disposition: A | Discharge status: Home / Self Care | Source: Ambulatory Visit | Attending: Physician Assistant | Admitting: Physician Assistant

## 2024-01-09 ENCOUNTER — Other Ambulatory Visit (HOSPITAL_COMMUNITY): Payer: Self-pay | Admitting: Physician Assistant

## 2024-01-09 DIAGNOSIS — R2241 Localized swelling, mass and lump, right lower limb: Secondary | ICD-10-CM | POA: Insufficient documentation

## 2024-01-09 DIAGNOSIS — M7989 Other specified soft tissue disorders: Secondary | ICD-10-CM | POA: Diagnosis not present

## 2024-01-09 DIAGNOSIS — R600 Localized edema: Secondary | ICD-10-CM | POA: Diagnosis not present

## 2024-01-09 DIAGNOSIS — M79604 Pain in right leg: Secondary | ICD-10-CM | POA: Diagnosis not present

## 2024-01-10 ENCOUNTER — Ambulatory Visit (INDEPENDENT_AMBULATORY_CARE_PROVIDER_SITE_OTHER): Payer: Medicare Other | Admitting: Family Medicine

## 2024-01-10 ENCOUNTER — Encounter: Payer: Self-pay | Admitting: Family Medicine

## 2024-01-10 VITALS — BP 118/70 | HR 66 | Resp 16 | Ht 64.5 in | Wt 169.0 lb

## 2024-01-10 DIAGNOSIS — M25561 Pain in right knee: Secondary | ICD-10-CM

## 2024-01-10 DIAGNOSIS — G8929 Other chronic pain: Secondary | ICD-10-CM

## 2024-01-10 DIAGNOSIS — M5410 Radiculopathy, site unspecified: Secondary | ICD-10-CM | POA: Diagnosis not present

## 2024-01-10 DIAGNOSIS — I10 Essential (primary) hypertension: Secondary | ICD-10-CM | POA: Diagnosis not present

## 2024-01-10 DIAGNOSIS — K219 Gastro-esophageal reflux disease without esophagitis: Secondary | ICD-10-CM

## 2024-01-10 MED ORDER — SUCRALFATE 1 G PO TABS: 1.0000 g | ORAL_TABLET | Freq: Two times a day (BID) | ORAL | 6 refills | Status: DC

## 2024-01-10 MED ORDER — METHYLPREDNISOLONE ACETATE 80 MG/ML IJ SUSP
80.0000 mg | Freq: Once | INTRAMUSCULAR | Status: AC
  Administered 2024-01-10: 80 mg via INTRAMUSCULAR

## 2024-01-10 MED ORDER — PREDNISONE 20 MG PO TABS: 20.0000 mg | ORAL_TABLET | Freq: Two times a day (BID) | ORAL | 0 refills | Status: DC

## 2024-01-10 NOTE — Assessment & Plan Note (Signed)
Uncontrolled. depo medrol 80 mg administered IM in the office , to be followed by a short course of oral prednisone.

## 2024-01-10 NOTE — Assessment & Plan Note (Signed)
 Increased and uncontrolled refer Dr Romeo Apple

## 2024-01-10 NOTE — Patient Instructions (Addendum)
 Follow-up in 4 months, call if you need me sooner.  You are referred to Dr. Romeo Apple reright knee pain and swelling.  Depo-Medrol 80 mg IM in the office to be followed by 5-day course of prednisone for back pain with sciatic affecting the right lower extremity.  Carafate is prescribed and sent to your pharmacy for stomach pain.  Thanks for choosing [facility], we consider it a privelige to serve you.

## 2024-01-11 ENCOUNTER — Encounter: Payer: Self-pay | Admitting: Family Medicine

## 2024-01-11 ENCOUNTER — Other Ambulatory Visit: Payer: Self-pay | Admitting: Family Medicine

## 2024-01-12 ENCOUNTER — Encounter: Payer: Self-pay | Admitting: Orthopedic Surgery

## 2024-01-12 ENCOUNTER — Ambulatory Visit (INDEPENDENT_AMBULATORY_CARE_PROVIDER_SITE_OTHER): Admitting: Orthopedic Surgery

## 2024-01-12 DIAGNOSIS — M1711 Unilateral primary osteoarthritis, right knee: Secondary | ICD-10-CM

## 2024-01-12 MED ORDER — METHYLPREDNISOLONE ACETATE 40 MG/ML IJ SUSP
40.0000 mg | Freq: Once | INTRAMUSCULAR | Status: AC
Start: 2024-01-12 — End: 2024-01-12
  Administered 2024-01-12: 40 mg via INTRA_ARTICULAR

## 2024-01-12 NOTE — Patient Instructions (Signed)

## 2024-01-12 NOTE — Addendum Note (Signed)
 Addended byCaffie Damme on: 01/12/2024 10:35 AM   Modules accepted: Orders

## 2024-01-12 NOTE — Progress Notes (Signed)
 Return patient Visit  Assessment: Laura [name] is a 73 y.o. female with the following: 1. Arthritis of right knee  Plan: Laura [name] has advanced degenerative changes in the right knee.  She continues to have pain.  Most recent injection was effective.  She would like to proceed with another injection today.  This was completed without issues.  Procedure note injection Right knee joint   Verbal consent was obtained to inject the right knee joint  Timeout was completed to confirm the site of injection.  The skin was prepped with alcohol and ethyl chloride was sprayed at the injection site.  A 21-gauge needle was used to inject 40 mg of Depo-Medrol and 1% lidocaine (4 cc) into the right knee using an anterolateral approach.  There were no complications. A sterile bandage was applied.   Follow-up: Return if symptoms worsen or fail to improve.  Subjective:  Chief Complaint  Patient presents with   Knee Pain    Right     History of Present Illness: Laura [name] is a 73 y.o. female who returns to clinic for repeat evaluation of right knee pain.  She has advanced degenerative changes of the right knee.  I saw her in clinic several months ago.  At that time, we injected the right knee.  She had a good response until recent.  The pain is returned.  She would like another injection.   Review of Systems: No fevers or chills No numbness or tingling No chest pain No shortness of breath No bowel or bladder dysfunction No GI distress No headaches    Objective: There were no vitals taken for this visit.  Physical Exam:  General: Alert and oriented. and No acute distress. Gait: Right sided antalgic gait.  Evaluation of the right knee demonstrates a mild effusion.  No obvious malalignment.  She has tenderness palpation in the lateral joint line.  tenderness palpation over the medial joint line.  She can get to full extension.  She tolerates flexion beyond 90  degrees.  IMAGING: No new imaging obtained today   New Medications:  No orders of the defined types were placed in this encounter.     Oliver Barre, MD  01/12/2024 9:49 AM

## 2024-01-13 ENCOUNTER — Other Ambulatory Visit: Payer: Self-pay | Admitting: "Endocrinology

## 2024-01-13 DIAGNOSIS — E890 Postprocedural hypothyroidism: Secondary | ICD-10-CM

## 2024-01-15 LAB — ALPHA-THALASSEMIA GENOTYPR

## 2024-01-29 ENCOUNTER — Encounter: Payer: Self-pay | Admitting: Family Medicine

## 2024-01-29 NOTE — Assessment & Plan Note (Signed)
 Controlled, no change in medication

## 2024-01-29 NOTE — Assessment & Plan Note (Signed)
 Increased and uncontrolled , depo medrol in office followed by short course of oral prednisone, also referred to Ortho

## 2024-01-29 NOTE — Assessment & Plan Note (Signed)
 Uncontrolled wirth increased stomach pain, carafate added for stomach pain  and reduction in or abstinance from caffdeine advised

## 2024-01-29 NOTE — Progress Notes (Signed)
   Laura [name]     MRN: [id number]      DOB: 12-14-1950  Chief Complaint  Patient presents with   Hypertension    Follow up visit    Knee Pain    Has been right knee pain and swelling and wants an injection in it from Dr Romeo Apple    HPI Laura [name] is here for follow up and re-evaluation of chronic medical conditions, medication management and review of any available recent lab and radiology data.  Preventive health is updated, specifically  Cancer screening and Immunization.   Questions or concerns regarding consultations or procedures which the PT has had in the interim are  addressed. The PT denies any adverse reactions to current medications since the last visit.  complaint as above  ROS Denies recent fever or chills. Denies sinus pressure, nasal congestion, ear pain or sore throat. Denies chest congestion, productive cough or wheezing. Denies chest pains, palpitations and leg swelling C/o abdonminal pain, denies nausea, vomiting,diarrhea or constipation.   Denies dysuria, frequency, hesitancy or incontinence. . Denies headaches, seizures, numbness, or tingling. Denies  uncontrolled depression, unconrolled anxiety or insomnia. Denies skin break down or rash.   PE  BP 118/70   Pulse 66   Resp 16   Ht 5' 4.5" (1.638 m)   Wt 169 lb (76.7 kg)   SpO2 96%   BMI 28.56 kg/m   Patient alert and oriented and in no cardiopulmonary distress.  HEENT: No facial asymmetry, EOMI,     Neck supple .  Chest: Clear to auscultation bilaterally.  CVS: S1, S2 no murmurs, no S3.Regular rate.  ABD: Soft mild epigastric tenderness to superficial palpation,no guarding or rebound  Ext: No edema  MS: decreased  ROM spine,and right  knee.  Skin: Intact, no ulcerations or rash noted.  Psych: Good eye contact, normal affect. Memory intact not anxious or depressed appearing.  CNS: CN 2-12 intact, power,  normal throughout.no focal deficits noted.   Assessment & Plan  Right knee  pain Increased and uncontrolled refer Dr Romeo Apple  Back pain with radiculopathy Uncontrolled.depo medrol 80 mg administered IM in the office , to be followed by a short course of oral prednisone    Chronic pain of right knee Increased and uncontrolled , depo medrol in office followed by short course of oral prednisone, also referred to Ortho  Gastroesophageal reflux disease Uncontrolled wirth increased stomach pain, carafate added for stomach pain  and reduction in or abstinance from caffdeine advised  Essential hypertension Controlled, no change in medication

## 2024-02-06 ENCOUNTER — Encounter: Payer: Self-pay | Admitting: Gastroenterology

## 2024-02-06 ENCOUNTER — Other Ambulatory Visit: Payer: Self-pay | Admitting: Family Medicine

## 2024-02-14 ENCOUNTER — Telehealth: Payer: Self-pay | Admitting: Family Medicine

## 2024-02-14 NOTE — Telephone Encounter (Signed)
 Patient here says someone needs to call [facility] to tell them to slow down the Atorvastatin says they keep sending her bottles and now she has about 4. Please advise Thank you

## 2024-02-26 ENCOUNTER — Telehealth: Payer: Self-pay | Admitting: Family Medicine

## 2024-02-26 NOTE — Telephone Encounter (Signed)
 Per patient she said send to you.  Needs to get another sciatica nerve shot.

## 2024-02-27 NOTE — Telephone Encounter (Signed)
 Called pt and she states having pain again in her right sciatic nerve. Pain goes from lower right side of back down her buttock and leg and she is requesting an IM injection for pain due to it impairing her day to day activities and ability to be active. Pls advise if appt is needed or if she can come for a nurse visit for injection.

## 2024-02-29 NOTE — Telephone Encounter (Signed)
 Lvm to cb

## 2024-02-29 NOTE — Telephone Encounter (Signed)
 Pt advised. She is asking when she would be able to receive another injection? States she has been alternating heat/ice, taking tylenol , and otc pain creams.

## 2024-03-01 ENCOUNTER — Encounter: Payer: Self-pay | Admitting: Family Medicine

## 2024-03-01 NOTE — Telephone Encounter (Signed)
 LVM to cb. Need to schedule pt for 2:00 next Friday per Dr. Rodolph Clap

## 2024-03-04 ENCOUNTER — Telehealth: Payer: Self-pay | Admitting: Family Medicine

## 2024-03-04 NOTE — Progress Notes (Unsigned)
 [facility] [address], [place] [address]   CLINIC:  Medical Oncology/Hematology  PCP:  Towanda Fret, MD [address] [place] [address] [phone]   REASON FOR VISIT:  Follow-up for normocytic anemia  CURRENT THERAPY: Surveillance  INTERVAL HISTORY:   Ms. Penaloza 73 y.o. female returns for routine follow-up of normocytic anemia.  She was last seen by Sheril Dines, PA-C on 01/02/2024.  At today's visit, she reports feeling very well. *** She denies any interval changes since her last visit.  *** She denies any rectal bleeding or melena.  ***No abnormal fatigue, pica, headaches, lightheadedness, syncope, chest pain, or dyspnea on exertion.  She has 100***% energy and 100***% appetite. She endorses that she is maintaining a stable weight.***  ASSESSMENT & PLAN:  1.  Normocytic anemia secondary to alpha thalassemia trait: - Patient seen at the request of Dr. Brice Campi due to labs from [facility] on 11/13/2023 showing Hgb 10.6/MCV 89 - Review of past labs (via [facility]) shows mild anemia since at least 2015, with baseline around Hgb 10 to 11 g/dL range - Patient reports "lifelong" anemia, even as a young woman - Previously worked up at [facility] by Dr. Allyne Areola / Dr. Kathy Weant.  Prior workup notable for the following: Full GI workup including EGD, colonoscopy, capsule endoscopy was negative for bleeding Hemoglobin electrophoresis was normal. No etiology of anemia was found. - Most recent workup (January/February 2025) Normal B12 and folate (labs via PCP and [facility], 12/22/2023).  Normal MMA and copper . Normal creatinine 0.96 ([facility] 11/13/2023) No evidence of hemolysis (normal DAT, reticulocytes, LDH) Marginal iron deficiency with ferritin 70, iron saturation 23%. Normal immunofixation, SPEP, light chains. - EGD (04/20/2023): For dyspepsia: Gastritis and duodenal polyp. - Small bowel enteroscopy (12/11/2023): Polyp in the  third/fourth part of duodenum, consistent with adenoma. - Colonoscopy (03/15/2021): Nonbleeding internal hemorrhoids, diverticulosis in the sigmoid and descending colon - No family history of anemia.  Maternal aunt has multiple myeloma. - No prior history of transfusion.  She took many years ago which caused constipation. - Alpha thalassemia DNA analysis (01/02/2023) carrier of single gene mutation consistent with alpha thalassemia trait. - Denies BRBPR/melena.  No fatigue.   No ice pica.*** - PLAN: Long-term, stable anemia secondary to alpha thalassemia trait. - We will recheck CBC/D with iron panel in 1 year.  If stable, we will discharge to PCP at that time.  *** Discharge to PCP now?  ***  2.  Social/family history: - Lives at home with her husband and is independent of ADLs and IADLs.  Non-smoker.  No family history of anemia.  Maternal aunt has multiple myeloma.  PLAN SUMMARY: *** Or discharge to PCP?  *** >> Labs in 1 year = CBC/D, ferritin, iron/TIBC >> OFFICE visit in 1 year      REVIEW OF SYSTEMS: No complaints.***  Review of Systems  Constitutional:  Negative for appetite change, chills, diaphoresis, fatigue, fever and unexpected weight change.  HENT:   Negative for lump/mass and nosebleeds.   Eyes:  Negative for eye problems.  Respiratory:  Negative for cough, hemoptysis and shortness of breath.   Cardiovascular:  Negative for chest pain, leg swelling and palpitations.  Gastrointestinal:  Negative for abdominal pain, blood in stool, constipation, diarrhea, nausea and vomiting.  Genitourinary:  Negative for hematuria.   Skin: Negative.   Neurological:  Negative for dizziness, headaches and light-headedness.  Hematological:  Does not bruise/bleed easily.  PHYSICAL EXAM:  ECOG PERFORMANCE STATUS: 0 - Asymptomatic *** There were no vitals filed for this visit.  There were no vitals filed for this visit.  Physical Exam Constitutional:      Appearance: Normal appearance.  She is obese.  Cardiovascular:     Heart sounds: Normal heart sounds.  Pulmonary:     Breath sounds: Normal breath sounds.  Neurological:     General: No focal deficit present.     Mental Status: Mental status is at baseline.  Psychiatric:        Behavior: Behavior normal. Behavior is cooperative.    PAST MEDICAL/SURGICAL HISTORY:  Past Medical History:  Diagnosis Date   Anxiety    At low risk for fall 08/15/2018   Cystitis    Depression    Gastritis    GERD (gastroesophageal reflux disease)    Hashimoto's thyroiditis    Hx    Hyperlipidemia    Hypertension    Hypothyroidism    Normocytic anemia 2009 Hb 9.9-11.1   Osteopenia    Shoulder pain    Urinary incontinence    Past Surgical History:  Procedure Laterality Date   Bilateral tubal ligation     BIOPSY  04/21/2020   Procedure: BIOPSY;  Surgeon: Suzette Espy, MD;  [facility];  Service: Endoscopy;;   BIOPSY  04/20/2023   Procedure: BIOPSY;  Surgeon: Vinetta Greening, DO;  [facility];  Service: Endoscopy;;   BREAST EXCISIONAL BIOPSY  2010   Excisional biopsy of benign left breast mass -lopoma    COLONOSCOPY  08/26/2011   SLF: 1. Internal hemorrhoids   COLONOSCOPY N/A 10/06/2015   ZOX:WRUE fissure or internal hemorrhoids/mild sigmoid colitis. benign colonic path    COLONOSCOPY WITH PROPOFOL  N/A 03/15/2021   Procedure: COLONOSCOPY WITH PROPOFOL ;  Surgeon: Vinetta Greening, DO;  [facility];  Service: Endoscopy;  Laterality: N/A;  3:00pm   colonscopy  2005   Dr. Felipe Horton   ENDOSCOPIC MUCOSAL RESECTION N/A 12/11/2023   Procedure: ENDOSCOPIC MUCOSAL RESECTION;  Surgeon: Brice Campi Albino Alu., MD;  Location: [facility];  Service: Gastroenterology;  Laterality: N/A;   ENTEROSCOPY N/A 07/25/2023   Procedure: ENTEROSCOPY;  Surgeon: Vinetta Greening, DO;  [facility];  Service: Endoscopy;  Laterality: N/A;  1:45 pm, asa 2   ENTEROSCOPY N/A 12/11/2023   Procedure:  ENTEROSCOPY;  Surgeon: Brice Campi Albino Alu., MD;  Location: [facility];  Service: Gastroenterology;  Laterality: N/A;   ESOPHAGOGASTRODUODENOSCOPY  12/2009   chronic gastritis   ESOPHAGOGASTRODUODENOSCOPY N/A 05/17/2017   Dr. Nolene Baumgarten; gastritis, normal small bowel biopsies, multiple gastric polyps. fundic gland polyps   ESOPHAGOGASTRODUODENOSCOPY (EGD) WITH PROPOFOL  N/A 04/21/2020   Rourk: multiple gastric polyps, biopsy of one c/w fundic gland. gastric biopsy showed mild chronic gastritis but no H.pylori   ESOPHAGOGASTRODUODENOSCOPY (EGD) WITH PROPOFOL  N/A 04/20/2023   Procedure: ESOPHAGOGASTRODUODENOSCOPY (EGD) WITH PROPOFOL ;  Surgeon: Vinetta Greening, DO;  [facility];  Service: Endoscopy;  Laterality: N/A;  1:00 pm, asa 3, pt knows to arrive at 6:30   GIVENS CAPSULE STUDY N/A 05/30/2017   gastritis, no source for anemia identified   HEMOSTASIS CLIP PLACEMENT  12/11/2023   Procedure: HEMOSTASIS CLIP PLACEMENT;  Surgeon: Normie Becton., MD;  Location: [facility];  Service: Gastroenterology;;   HEMOSTASIS CONTROL  12/11/2023   Procedure: HEMOSTASIS CONTROL;  Surgeon: Normie Becton., MD;  Location: [facility];  Service: Gastroenterology;;   HOT HEMOSTASIS N/A 12/11/2023   Procedure: HOT  HEMOSTASIS (ARGON PLASMA COAGULATION/BICAP);  Surgeon: Normie Becton., MD;  Location: [facility];  Service: Gastroenterology;  Laterality: N/A;   left knee surgery Left 12/30/2015   PARTIAL KNEE ARTHROPLASTY Left 08/09/2017   Procedure: LEFT UNICOMPARTMENTAL KNEE;  Surgeon: Liliane Rei, MD;  Location: [facility];  Service: Orthopedics;  Laterality: Left;   POLYPECTOMY  04/21/2020   Procedure: POLYPECTOMY;  Surgeon: Suzette Espy, MD;  [facility];  Service: Endoscopy;;  gastric   Resection of left lobe of thyroid      right carpal tunnel release     rt. knee athroscopy  2004   SAVORY DILATION N/A 05/17/2017   Procedure: SAVORY DILATION;  Surgeon:  Alyce Jubilee, MD;  [facility];  Service: Endoscopy;  Laterality: N/A;   SUBMUCOSAL LIFTING INJECTION  12/11/2023   Procedure: SUBMUCOSAL LIFTING INJECTION;  Surgeon: Brice Campi Albino Alu., MD;  Location: [facility];  Service: Gastroenterology;;   SUBMUCOSAL TATTOO INJECTION  12/11/2023   Procedure: SUBMUCOSAL TATTOO INJECTION;  Surgeon: Normie Becton., MD;  Location: [facility];  Service: Gastroenterology;;   TOTAL ABDOMINAL HYSTERECTOMY  1994   UMBILICAL HERNIA REPAIR     Urethral dilation for stenosis  2009    SOCIAL HISTORY:  Social History   Socioeconomic History   Marital status: Married    Spouse name: Not on file   Number of children: 3   Years of education: Not on file   Highest education level: Not on file  Occupational History   Occupation: Designer, 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    Occupation: retired  Tobacco Use   Smoking status: Former    Current packs/day: 0.00    Average packs/day: 0.3 packs/day for 1 year (0.3 ttl pk-yrs)    Types: Cigarettes    Start date: 08/26/1971    Quit date: 08/25/1972    Years since quitting: 51.5    Passive exposure: Past   Smokeless tobacco: Never  Vaping Use   Vaping status: Never Used  Substance and Sexual Activity   Alcohol use: No   Drug use: No   Sexual activity: Not Currently    Birth control/protection: Surgical    Comment: hyst  Other Topics Concern   Not on file  Social History Narrative   Not on file   Social Drivers of Health   Financial Resource Strain: Low Risk  (09/27/2023)   Overall Financial Resource Strain (CARDIA)    Difficulty of Paying Living Expenses: Not hard at all  Food Insecurity: No Food Insecurity (09/27/2023)   Hunger Vital Sign    Worried About Running Out of Food in the Last Year: Never true    Ran Out of Food in the Last Year: Never true  Transportation Needs: No Transportation Needs (09/27/2023)   PRAPARE - Administrator, Civil Service  (Medical): No    Lack of Transportation (Non-Medical): No  Physical Activity: Inactive (09/27/2023)   Exercise Vital Sign    Days of Exercise per Week: 0 days    Minutes of Exercise per Session: 0 min  Stress: Stress Concern Present (09/27/2023)   [employer] of Occupational Health - Occupational Stress Questionnaire    Feeling of Stress : To some extent  Social Connections: Moderately Integrated (09/27/2023)   Social Connection and Isolation Panel [NHANES]    Frequency of Communication with Friends and Family: Three times a week    Frequency of Social Gatherings with Friends and Family: Three times a week    Attends Religious  Services: More than 4 times per year    Active Member of Clubs or Organizations: No    Attends Banker Meetings: Never    Marital Status: Married  Catering manager Violence: Not At Risk (09/27/2023)   Humiliation, Afraid, Rape, and Kick questionnaire    Fear of Current or Ex-Partner: No    Emotionally Abused: No    Physically Abused: No    Sexually Abused: No    FAMILY HISTORY:  Family History  Problem Relation Age of Onset   Pneumonia Mother    Healthy Father    Colon cancer Neg Hx    Liver disease Neg Hx    Esophageal cancer Neg Hx    Inflammatory bowel disease Neg Hx    Pancreatic cancer Neg Hx    Rectal cancer Neg Hx    Stomach cancer Neg Hx     CURRENT MEDICATIONS:  Outpatient Encounter Medications as of 03/05/2024  Medication Sig   acetaminophen  (TYLENOL ) 500 MG tablet Take 1 tablet (500 mg total) by mouth every 6 (six) hours as needed.   amLODipine  (NORVASC ) 5 MG tablet TAKE 1 TABLET BY MOUTH DAILY   atorvastatin  (LIPITOR) 80 MG tablet TAKE 1 TABLET BY MOUTH ONCE  DAILY   buPROPion  (WELLBUTRIN  XL) 150 MG 24 hr tablet Take one tablet by mouth once daily   Calcium  Carbonate-Vit D-Min (CALCIUM  1200) 1200-1000 MG-UNIT CHEW Take one tablet by mouth two times daily   citalopram  (CELEXA ) 40 MG tablet TAKE 1 TABLET BY MOUTH DAILY    cyclobenzaprine  (FLEXERIL ) 10 MG tablet Take 1 tablet (10 mg total) by mouth 2 (two) times daily as needed.   fluticasone  (FLONASE ) 50 MCG/ACT nasal spray Place 1 spray into both nostrils daily as needed for allergies or rhinitis.   furosemide  (LASIX ) 20 MG tablet TAKE 1 TABLET(20 MG) BY MOUTH DAILY   gabapentin  (NEURONTIN ) 100 MG capsule TAKE 2 CAPSULES BY MOUTH AT  BEDTIME FOR NEUROPATHIC PAIN   hydrochlorothiazide  (HYDRODIURIL ) 25 MG tablet TAKE 1 TABLET BY MOUTH DAILY   levocetirizine (XYZAL ) 5 MG tablet Take 1 tablet (5 mg total) by mouth every evening.   levothyroxine  (SYNTHROID ) 88 MCG tablet TAKE 1 TABLET BY MOUTH DAILY  BEFORE BREAKFAST   Multiple Vitamin (MULTIVITAMIN WITH MINERALS) TABS tablet Take 1 tablet by mouth daily.   omeprazole  (PRILOSEC) 20 MG capsule Take 1 capsule (20 mg total) by mouth 2 (two) times daily before a meal.   Polyethyl Glycol-Propyl Glycol (SYSTANE OP) Place 1 drop into both eyes daily as needed (dry eyes).   potassium chloride  (KLOR-CON ) 10 MEQ tablet Take one tablet by mouth once daily   predniSONE  (DELTASONE ) 20 MG tablet Take 1 tablet (20 mg total) by mouth 2 (two) times daily with a meal.   sucralfate  (CARAFATE ) 1 g tablet Take 1 tablet (1 g total) by mouth 2 (two) times daily.   Facility-Administered Encounter Medications as of 03/05/2024  Medication   lidocaine  (PF) (XYLOCAINE ) 1 % injection 4 mL    ALLERGIES:  Allergies  Allergen Reactions   Cozaar  [Losartan  Potassium] Cough   Dilaudid [Hydromorphone Hcl] Other (See Comments)    Confusion;altered state of mind   Levaquin [Levofloxacin] Other (See Comments)    Dry throat   Nsaids Other (See Comments)    per GI pt no longer to use NSAIDS    Promethazine      Legs itching, RESTLESS LEGS    LABORATORY DATA:  I have reviewed the labs as listed.  CBC  Component Value Date/Time   WBC 6.0 12/13/2023 0841   RBC 4.01 12/13/2023 0841   RBC 4.06 12/13/2023 0841   HGB 11.3 (L) 12/13/2023 0841    HGB 10.6 (L) 11/13/2023 0829   HCT 36.2 12/13/2023 0841   HCT 34.3 11/13/2023 0829   PLT 247 12/13/2023 0841   PLT 302 11/13/2023 0829   MCV 89.2 12/13/2023 0841   MCV 89 11/13/2023 0829   MCH 27.8 12/13/2023 0841   MCHC 31.2 12/13/2023 0841   RDW 12.3 12/13/2023 0841   RDW 11.9 11/13/2023 0829   LYMPHSABS 1.8 12/13/2023 0841   LYMPHSABS 1.5 11/13/2023 0829   MONOABS 0.4 12/13/2023 0841   EOSABS 0.1 12/13/2023 0841   EOSABS 0.1 11/13/2023 0829   BASOSABS 0.0 12/13/2023 0841   BASOSABS 0.1 11/13/2023 0829      Latest Ref Rng & Units 12/08/2023   11:03 AM 11/13/2023    8:29 AM 11/01/2023   10:51 AM  CMP  Glucose 70 - 99 mg/dL 87  604  540   BUN 6 - 23 mg/dL 15  11  11    Creatinine 0.40 - 1.20 mg/dL 9.81  1.91  4.78   Sodium 135 - 145 mEq/L 140  141  137   Potassium 3.5 - 5.1 mEq/L 2.9  3.4  2.8   Chloride 96 - 112 mEq/L 98  102  101   CO2 19 - 32 mEq/L 32  23  27   Calcium  8.4 - 10.5 mg/dL 9.4  9.8  9.8   Total Protein 6.0 - 8.5 g/dL  6.8  7.3   Total Bilirubin 0.0 - 1.2 mg/dL  0.4  0.9   Alkaline Phos 44 - 121 IU/L  82  58   AST 0 - 40 IU/L  15  23   ALT 0 - 32 IU/L  14  17     DIAGNOSTIC IMAGING:  I have independently reviewed the relevant imaging and discussed with the patient.   WRAP UP:  All questions were answered. The patient knows to call the clinic with any problems, questions or concerns.  Medical decision making: Moderate***  Time spent on visit: I spent 20 minutes counseling the patient face to face. The total time spent in the appointment was 30 minutes and more than 50% was on counseling.  Sonnie Dusky, PA-C  ***

## 2024-03-04 NOTE — Telephone Encounter (Signed)
 DISABILITY PLACARD   Noted  Copied Sleeved  Original in PCP box Copy front desk folder

## 2024-03-05 ENCOUNTER — Inpatient Hospital Stay: Attending: Physician Assistant | Admitting: Physician Assistant

## 2024-03-05 VITALS — BP 125/73 | HR 61 | Temp 97.8°F | Resp 18 | Ht 63.0 in | Wt 171.0 lb

## 2024-03-05 DIAGNOSIS — M8580 Other specified disorders of bone density and structure, unspecified site: Secondary | ICD-10-CM | POA: Insufficient documentation

## 2024-03-05 DIAGNOSIS — K219 Gastro-esophageal reflux disease without esophagitis: Secondary | ICD-10-CM | POA: Diagnosis not present

## 2024-03-05 DIAGNOSIS — D563 Thalassemia minor: Secondary | ICD-10-CM

## 2024-03-05 DIAGNOSIS — E785 Hyperlipidemia, unspecified: Secondary | ICD-10-CM | POA: Diagnosis not present

## 2024-03-05 DIAGNOSIS — I10 Essential (primary) hypertension: Secondary | ICD-10-CM | POA: Insufficient documentation

## 2024-03-05 DIAGNOSIS — D649 Anemia, unspecified: Secondary | ICD-10-CM | POA: Diagnosis not present

## 2024-03-05 DIAGNOSIS — E611 Iron deficiency: Secondary | ICD-10-CM | POA: Diagnosis not present

## 2024-03-05 DIAGNOSIS — Z87891 Personal history of nicotine dependence: Secondary | ICD-10-CM | POA: Insufficient documentation

## 2024-03-05 DIAGNOSIS — Z7952 Long term (current) use of systemic steroids: Secondary | ICD-10-CM | POA: Diagnosis not present

## 2024-03-05 DIAGNOSIS — Z79899 Other long term (current) drug therapy: Secondary | ICD-10-CM | POA: Insufficient documentation

## 2024-03-05 DIAGNOSIS — Z79890 Hormone replacement therapy (postmenopausal): Secondary | ICD-10-CM | POA: Diagnosis not present

## 2024-03-05 DIAGNOSIS — Z8719 Personal history of other diseases of the digestive system: Secondary | ICD-10-CM | POA: Insufficient documentation

## 2024-03-05 DIAGNOSIS — K5900 Constipation, unspecified: Secondary | ICD-10-CM | POA: Insufficient documentation

## 2024-03-05 NOTE — Patient Instructions (Addendum)
 [facility] at [facility] **VISIT SUMMARY & IMPORTANT INSTRUCTIONS **   You were seen today by Sheril Dines PA-C for your anemia (low hemoglobin/red blood cells).    Your lifelong anemia is due to an inherited genetic condition called "alpha thalassemia trait."  This is not causing any major problems, but explains your tendency to be mildly anemic.  You also have mildly low iron.  Start taking iron pill (FERROUS BISGLYCINATE or IRON BISGLYCINATE) every other day.  You can find this under the brand name "SlowFe" or "Gentle Iron."  If this causes any constipation, you can take this with a stool softener.  FOLLOW-UP APPOINTMENT: 6 months  ** Thank you for trusting me with your healthcare!  I strive to provide all of my patients with quality care at each visit.  If you receive a survey for this visit, I would be so grateful to you for taking the time to provide feedback.  Thank you in advance!  ~ Shandrea Lusk                   Dr. Paulett Boros   &   Sheril Dines, PA-C   - - - - - - - - - - - - - - - - - -    Thank you for choosing [facility] at [facility] to provide your oncology and hematology care.  To afford each patient quality time with our provider, please arrive at least 15 minutes before your scheduled appointment time.   If you have a lab appointment with the Cancer Center please come in thru the Main Entrance and check in at the main information desk.  You need to re-schedule your appointment should you arrive 10 or more minutes late.  We strive to give you quality time with our providers, and arriving late affects you and other patients whose appointments are after yours.  Also, if you no show three or more times for appointments you may be dismissed from the clinic at the providers discretion.     Again, thank you for choosing [facility].  Our hope is that these requests will decrease the amount of time that you  wait before being seen by our physicians.       _____________________________________________________________  Should you have questions after your visit to [facility], please contact our office at [phone] and follow the prompts.  Our office hours are 8:00 a.m. and 4:30 p.m. Monday - Friday.  Please note that voicemails left after 4:00 p.m. may not be returned until the following business day.  We are closed weekends and major holidays.  You do have access to a nurse 24-7, just call the main number to the clinic [phone] and do not press any options, hold on the line and a nurse will answer the phone.    For prescription refill requests, have your pharmacy contact our office and allow 72 hours.

## 2024-03-08 ENCOUNTER — Ambulatory Visit (INDEPENDENT_AMBULATORY_CARE_PROVIDER_SITE_OTHER): Admitting: Family Medicine

## 2024-03-08 ENCOUNTER — Encounter: Payer: Self-pay | Admitting: Family Medicine

## 2024-03-08 ENCOUNTER — Ambulatory Visit (HOSPITAL_COMMUNITY)
Admission: RE | Admit: 2024-03-08 | Discharge: 2024-03-08 | Disposition: A | Discharge status: Home / Self Care | Source: Ambulatory Visit | Attending: Family Medicine | Admitting: Family Medicine

## 2024-03-08 VITALS — BP 101/66 | HR 64 | Resp 18 | Ht 63.0 in | Wt 169.1 lb

## 2024-03-08 DIAGNOSIS — I10 Essential (primary) hypertension: Secondary | ICD-10-CM | POA: Diagnosis not present

## 2024-03-08 DIAGNOSIS — R300 Dysuria: Secondary | ICD-10-CM

## 2024-03-08 DIAGNOSIS — E782 Mixed hyperlipidemia: Secondary | ICD-10-CM | POA: Diagnosis not present

## 2024-03-08 DIAGNOSIS — M5410 Radiculopathy, site unspecified: Secondary | ICD-10-CM

## 2024-03-08 DIAGNOSIS — M47816 Spondylosis without myelopathy or radiculopathy, lumbar region: Secondary | ICD-10-CM | POA: Diagnosis not present

## 2024-03-08 DIAGNOSIS — E876 Hypokalemia: Secondary | ICD-10-CM

## 2024-03-08 DIAGNOSIS — M5116 Intervertebral disc disorders with radiculopathy, lumbar region: Secondary | ICD-10-CM | POA: Diagnosis not present

## 2024-03-08 DIAGNOSIS — M545 Low back pain, unspecified: Secondary | ICD-10-CM | POA: Diagnosis not present

## 2024-03-08 MED ORDER — PREDNISONE 5 MG PO TABS: 5.0000 mg | ORAL_TABLET | Freq: Two times a day (BID) | ORAL | 0 refills | Status: AC

## 2024-03-08 NOTE — Assessment & Plan Note (Signed)
 Uncontrolled and increased pain x 6 weeks, with weakness and numbness of right leg, x ray and MRI and refer for epidural, pred x 5 days  only

## 2024-03-08 NOTE — Patient Instructions (Signed)
 F/U as before , call if you need me sooner  X ray today and you will also need an MRI of your low back, will get appt scheduled and let you knpw  5 day course of prednisone  prescribed for uncontrolled sciatic pain  Thanks for choosing [facility], we consider it a privelige to serve you.

## 2024-03-10 ENCOUNTER — Encounter: Payer: Self-pay | Admitting: Family Medicine

## 2024-03-10 NOTE — Progress Notes (Incomplete)
   Laura [name]     MRN: [id number]      DOB: 02/16/1951  Chief Complaint  Patient presents with  . sciatic nerve pain    Complains of right sciatic nerve pain that radiates down leg. Going on for last few months. Pain increases with movement. Finds some relief ice/heat and tylenol .     HPI Laura [name] is here with  the specific c/o worseing back and right lower extremity pain.  With weakness and numbness. Pain is constant at 8 to 10, interferes with all aspects of her life. She denies incontinence of stool or urine.  Nothing relieves her pain. Except short term relief with steroids She has established disc disease with potential nerve compression from prior imaging.  Her symptoms have progressed over the past 6 to 8 weeks she needs help. No inciting trigger  ROS Denies recent fever or chills. Denies sinus pressure, nasal congestion, ear pain or sore throat. Denies chest congestion, productive cough or wheezing. Denies chest pains, palpitations and leg swelling Denies abdominal pain, nausea, vomiting,diarrhea or constipation.   Denies dysuria, frequency, hesitancy or incontinence. Denies skin break down or rash.   PE  BP 101/66   Pulse 64   Resp 18   Ht 5\' 3"  (1.6 m)   Wt 169 lb 1.3 oz (76.7 kg)   SpO2 96%   BMI 29.95 kg/m   Patient alert and oriented and in no cardiopulmonary distress.Pt in pain  HEENT: No facial asymmetry, EOMI,     Neck supple .  Chest: Clear to auscultation bilaterally.  CVS: S1, S2 no murmurs, no S3.Regular rate.  ABD: Soft non tender.   Ext: No edema  MS: decreased  ROM  lumbar spine, adequate in shoulders, hips and knees.  Skin: Intact, no ulcerations or rash noted.  Psych: Good eye contact, normal affect. Memory intact not anxious or depressed appearing.  CNS: CN 2-12 intact, grade 4 power in RLE with reduced sensation, exam otherwise normal Assessment & Plan  Back pain with radiculopathy Uncontrolled and increased pain x 6 weeks, with  weakness and numbness of right leg, x ray and MRI and refer for epidural, pred x 5 days  only

## 2024-03-10 NOTE — Progress Notes (Signed)
   Laura [name]     MRN: [id number]      DOB: 1951-10-07  Chief Complaint  Patient presents with   sciatic nerve pain    Complains of right sciatic nerve pain that radiates down leg. Going on for last few months. Pain increases with movement. Finds some relief ice/heat and tylenol .     HPI Laura [name] is here with  the specific c/o worseing back and right lower extremity pain.  With weakness and numbness. Pain is constant at 8 to 10, interferes with all aspects of her life. She denies incontinence of stool or urine.  Nothing relieves her pain. Except short term relief with steroids She has established disc disease with potential nerve compression from prior imaging.  Her symptoms have progressed over the past 6 to 8 weeks she needs help. No inciting trigger  ROS Denies recent fever or chills. Denies sinus pressure, nasal congestion, ear pain or sore throat. Denies chest congestion, productive cough or wheezing. Denies chest pains, palpitations and leg swelling Denies abdominal pain, nausea, vomiting,diarrhea or constipation.   Denies dysuria, frequency, hesitancy or incontinence. Denies skin break down or rash.   PE  BP 101/66   Pulse 64   Resp 18   Ht 5\' 3"  (1.6 m)   Wt 169 lb 1.3 oz (76.7 kg)   SpO2 96%   BMI 29.95 kg/m   Patient alert and oriented and in no cardiopulmonary distress.Pt in pain  HEENT: No facial asymmetry, EOMI,     Neck supple .  Chest: Clear to auscultation bilaterally.  CVS: S1, S2 no murmurs, no S3.Regular rate.  ABD: Soft non tender.   Ext: No edema  MS: decreased  ROM  lumbar spine, adequate in shoulders, hips and knees.  Skin: Intact, no ulcerations or rash noted.  Psych: Good eye contact, normal affect. Memory intact not anxious or depressed appearing.  CNS: CN 2-12 intact, grade 4 power in RLE with reduced sensation, exam otherwise normal Assessment & Plan Back pain with radiculopathy Uncontrolled and increased pain x 6 weeks, with  weakness and numbness of right leg, x ray and MRI and refer for epidural, pred x 5 days  only  Lumbar disc disease with radiculopathy 8 week h/o uncontrol pain wit new weakness  and numbness in RLE, and abn exam c/w history, update MRI and refer f o pain management/ epidural Prednisone  x 5 days only  Essential hypertension Controlled, no change in medication

## 2024-03-11 ENCOUNTER — Encounter: Payer: Self-pay | Admitting: Family Medicine

## 2024-03-11 DIAGNOSIS — M5116 Intervertebral disc disorders with radiculopathy, lumbar region: Secondary | ICD-10-CM | POA: Insufficient documentation

## 2024-03-11 NOTE — Assessment & Plan Note (Signed)
 Controlled, no change in medication

## 2024-03-11 NOTE — Assessment & Plan Note (Addendum)
 8 week h/o uncontrol pain wit new weakness  and numbness in RLE, and abn exam c/w history, update MRI and refer f o pain management/ epidural Prednisone  x 5 days only

## 2024-03-11 NOTE — Assessment & Plan Note (Signed)
 Hyperlipidemia:Low fat diet discussed and encouraged.   Lipid Panel  Lab Results  Component Value Date   CHOL 224 (H) 11/13/2023   HDL 57 11/13/2023   LDLCALC 145 (H) 11/13/2023   TRIG 125 11/13/2023   CHOLHDL 3.9 11/13/2023     Uncontrolled Updated lab needed at/ before next visit.

## 2024-03-15 ENCOUNTER — Telehealth: Payer: Self-pay | Admitting: Family Medicine

## 2024-03-15 NOTE — Telephone Encounter (Signed)
 Error, duplicate encounter

## 2024-03-15 NOTE — Telephone Encounter (Deleted)
 PLACARD  Noted  Copied Sleeved  Original in PCP box Copy front desk folder

## 2024-03-18 ENCOUNTER — Telehealth: Payer: Self-pay | Admitting: Family Medicine

## 2024-03-18 NOTE — Telephone Encounter (Signed)
Placard  Noted  Copied Sleeved  Original in PCP box Copy front desk folder

## 2024-03-26 ENCOUNTER — Other Ambulatory Visit: Payer: Self-pay | Admitting: Family Medicine

## 2024-04-05 ENCOUNTER — Ambulatory Visit (HOSPITAL_COMMUNITY)
Admission: RE | Admit: 2024-04-05 | Discharge: 2024-04-05 | Disposition: A | Discharge status: Home / Self Care | Source: Ambulatory Visit | Attending: Family Medicine | Admitting: Family Medicine

## 2024-04-05 DIAGNOSIS — M5117 Intervertebral disc disorders with radiculopathy, lumbosacral region: Secondary | ICD-10-CM | POA: Diagnosis not present

## 2024-04-05 DIAGNOSIS — M5116 Intervertebral disc disorders with radiculopathy, lumbar region: Secondary | ICD-10-CM | POA: Insufficient documentation

## 2024-04-05 DIAGNOSIS — M4807 Spinal stenosis, lumbosacral region: Secondary | ICD-10-CM | POA: Diagnosis not present

## 2024-04-05 DIAGNOSIS — M545 Low back pain, unspecified: Secondary | ICD-10-CM | POA: Diagnosis not present

## 2024-04-05 DIAGNOSIS — M48061 Spinal stenosis, lumbar region without neurogenic claudication: Secondary | ICD-10-CM | POA: Diagnosis not present

## 2024-04-23 ENCOUNTER — Ambulatory Visit: Admitting: Orthopedic Surgery

## 2024-04-23 DIAGNOSIS — M65311 Trigger thumb, right thumb: Secondary | ICD-10-CM

## 2024-04-23 DIAGNOSIS — M1711 Unilateral primary osteoarthritis, right knee: Secondary | ICD-10-CM | POA: Diagnosis not present

## 2024-04-23 NOTE — Patient Instructions (Signed)

## 2024-04-24 ENCOUNTER — Telehealth: Payer: Self-pay | Admitting: Orthopedic Surgery

## 2024-04-24 NOTE — Telephone Encounter (Signed)
 DR. ONESIMO  Patient called and states that she is not getting no relief after her injection on her thumb.  She states it is still bending and jumping up and down and it is sore.  Please call her back at [phone]

## 2024-04-25 ENCOUNTER — Other Ambulatory Visit: Payer: Self-pay

## 2024-04-25 ENCOUNTER — Ambulatory Visit: Payer: Self-pay | Admitting: Family Medicine

## 2024-04-25 NOTE — Telephone Encounter (Signed)
 Spoke w/ pt and relayed information from Dr. Onesimo. Pt states it is starting to feel better but it's still sore.

## 2024-04-25 NOTE — Progress Notes (Signed)
 Return patient Visit  Assessment: Laura [name] is a 73 y.o. female with the following: 1. Arthritis of right knee 2.  Right thumb trigger finger  Plan: ABRIAL ARRIGHI has advanced degenerative changes in the right knee.  Prior injections have been successful.  She is interested in another injection today.  This was completed without issues today.  In addition, she has pain in her right thumb, with some catching, consistent with trigger thumb.  An injection was completed today.  She will return to clinic as needed.   Procedure note injection Right knee joint   Verbal consent was obtained to inject the right knee joint  Timeout was completed to confirm the site of injection.  The skin was prepped with alcohol and ethyl chloride was sprayed at the injection site.  A 21-gauge needle was used to inject 40 mg of Depo-Medrol  and 1% lidocaine  (4 cc) into the right knee using an anterolateral approach.  There were no complications. A sterile bandage was applied.  Procedure note injection - Right Thumb A1 Pulley  Verbal consent was obtained to inject the Right Thumb A1 pulley Timeout was completed to confirm the site of injection.  The skin was prepped with alcohol and ethyl chloride was sprayed at the injection site.  A 21-gauge needle was used to inject 40 mg of Depo-Medrol  and 1% lidocaine  (1 cc) into the Right Thumb using a direct anterior approach.  There were no complications. Patient tolerated the procedure well. A sterile bandage was applied    Follow-up: Return if symptoms worsen or fail to improve.  Subjective:  Chief Complaint  Patient presents with   Injections    R knee    History of Present Illness: SAYLAH KETNER is a 73 y.o. female who returns to clinic for repeat evaluation of right knee pain.  She has advanced degenerative changes in the right knee.  She continues to have discomfort.  Injections continue to be effective.  She is interested in repeat injection  today.  Over the past several weeks, she has noticed pain to the right thumb, as well as a catching sensation.  It is painful when she has to use her other hand to straighten her thumb.  No specific injury.  She has not tried anything for her thumb.   Review of Systems: No fevers or chills No numbness or tingling No chest pain No shortness of breath No bowel or bladder dysfunction No GI distress No headaches    Objective: There were no vitals taken for this visit.  Physical Exam:  General: Alert and oriented. and No acute distress. Gait: Right sided antalgic gait.  Evaluation of the right knee demonstrates a mild effusion.  No obvious malalignment.  She has tenderness palpation in the lateral joint line.  tenderness palpation over the medial joint line.  She can get to full extension.  She tolerates flexion beyond 90 degrees.  Evaluation of the right hand is without deformity.  Tenderness to palpation over the A1 pulley to the thumb.  Active triggering is noted in clinic today.  She is able to make a fist.  She is reluctant due to the pain.  IMAGING: No new imaging obtained today   New Medications:  No orders of the defined types were placed in this encounter.     Oneil DELENA Horde, MD  04/25/2024 8:09 AM

## 2024-04-26 ENCOUNTER — Other Ambulatory Visit: Payer: Self-pay

## 2024-04-26 DIAGNOSIS — M545 Low back pain, unspecified: Secondary | ICD-10-CM

## 2024-04-29 ENCOUNTER — Encounter: Payer: Self-pay | Admitting: Family Medicine

## 2024-05-01 NOTE — Telephone Encounter (Signed)
Lvm to call and schedule

## 2024-05-02 ENCOUNTER — Telehealth: Payer: Self-pay | Admitting: Family Medicine

## 2024-05-02 ENCOUNTER — Other Ambulatory Visit: Payer: Self-pay | Admitting: Family Medicine

## 2024-05-02 NOTE — Telephone Encounter (Signed)
 Came into the office with her husband and wanted to report that she has been losing weight. Now weighs 167lbs and wants to know if you recommend anything that will help her appetite. States she sees food and wants to eat but can't eat but a few bites before she is full and her pants are starting to fall off of her. Pls advise

## 2024-05-06 ENCOUNTER — Ambulatory Visit (INDEPENDENT_AMBULATORY_CARE_PROVIDER_SITE_OTHER): Payer: Self-pay

## 2024-05-06 DIAGNOSIS — M545 Low back pain, unspecified: Secondary | ICD-10-CM

## 2024-05-06 MED ORDER — METHYLPREDNISOLONE ACETATE 80 MG/ML IJ SUSP
80.0000 mg | Freq: Once | INTRAMUSCULAR | Status: AC
  Administered 2024-05-06: 80 mg via INTRAMUSCULAR

## 2024-05-06 NOTE — Progress Notes (Signed)
 Patient is in office today for a nurse visit for depo medrol  injection. Patient tolerated well , injection was given on right ventrogluteal side.

## 2024-05-14 ENCOUNTER — Ambulatory Visit (INDEPENDENT_AMBULATORY_CARE_PROVIDER_SITE_OTHER): Admitting: Family Medicine

## 2024-05-14 ENCOUNTER — Encounter: Payer: Self-pay | Admitting: Family Medicine

## 2024-05-14 VITALS — BP 122/60 | HR 58 | Resp 16 | Ht 63.0 in | Wt 166.1 lb

## 2024-05-14 DIAGNOSIS — E890 Postprocedural hypothyroidism: Secondary | ICD-10-CM

## 2024-05-14 DIAGNOSIS — M4726 Other spondylosis with radiculopathy, lumbar region: Secondary | ICD-10-CM | POA: Diagnosis not present

## 2024-05-14 DIAGNOSIS — Z6379 Other stressful life events affecting family and household: Secondary | ICD-10-CM | POA: Diagnosis not present

## 2024-05-14 DIAGNOSIS — K219 Gastro-esophageal reflux disease without esophagitis: Secondary | ICD-10-CM

## 2024-05-14 DIAGNOSIS — I10 Essential (primary) hypertension: Secondary | ICD-10-CM

## 2024-05-14 DIAGNOSIS — M5116 Intervertebral disc disorders with radiculopathy, lumbar region: Secondary | ICD-10-CM

## 2024-05-14 DIAGNOSIS — E782 Mixed hyperlipidemia: Secondary | ICD-10-CM | POA: Diagnosis not present

## 2024-05-14 DIAGNOSIS — F411 Generalized anxiety disorder: Secondary | ICD-10-CM

## 2024-05-14 DIAGNOSIS — F324 Major depressive disorder, single episode, in partial remission: Secondary | ICD-10-CM

## 2024-05-14 NOTE — Patient Instructions (Addendum)
 Annual exam Jan 15 or after  Please schedule mammogram at checkout  Nurse pls refer to [facility] for epidural injection,( back pain due to spinal stenosis and disc disease)  Fasting lipid, cmp and EgFR today   You d do not need IV iron  TRy to take the iron twice weekly if you can tolerate otherwise eat foods rich in iron  Join the [employer], join senior exercise classes and water  aerobic classes and start walking around the hospital track with yor friend  Keep visiting elderly, caring for your  Aunt and praying for your family  Thanks for choosing [facility], we consider it a privelige to serve you.

## 2024-05-15 ENCOUNTER — Encounter: Payer: Self-pay | Admitting: Family Medicine

## 2024-05-15 ENCOUNTER — Other Ambulatory Visit: Payer: Self-pay

## 2024-05-15 DIAGNOSIS — M47816 Spondylosis without myelopathy or radiculopathy, lumbar region: Secondary | ICD-10-CM | POA: Insufficient documentation

## 2024-05-15 DIAGNOSIS — M5116 Intervertebral disc disorders with radiculopathy, lumbar region: Secondary | ICD-10-CM

## 2024-05-15 DIAGNOSIS — Z6379 Other stressful life events affecting family and household: Secondary | ICD-10-CM | POA: Insufficient documentation

## 2024-05-15 LAB — LIPID PANEL
Chol/HDL Ratio: 2.9 ratio (ref 0.0–4.4)
Cholesterol, Total: 201 mg/dL — ABNORMAL HIGH (ref 100–199)
HDL: 69 mg/dL (ref >39)
LDL Chol Calc (NIH): 113 mg/dL — ABNORMAL HIGH (ref 0–99)
Triglycerides: 110 mg/dL (ref 0–149)
VLDL Cholesterol Cal: 19 mg/dL (ref 5–40)

## 2024-05-15 LAB — CMP14+EGFR
ALT: 25 IU/L (ref 0–32)
AST: 21 IU/L (ref 0–40)
Albumin: 4.3 g/dL (ref 3.8–4.8)
Alkaline Phosphatase: 90 IU/L (ref 44–121)
BUN/Creatinine Ratio: 17 (ref 12–28)
BUN: 14 mg/dL (ref 8–27)
Bilirubin Total: 0.7 mg/dL (ref 0.0–1.2)
CO2: 22 mmol/L (ref 20–29)
Calcium: 10.2 mg/dL (ref 8.7–10.3)
Chloride: 100 mmol/L (ref 96–106)
Creatinine, Ser: 0.83 mg/dL (ref 0.57–1.00)
Globulin, Total: 2.9 g/dL (ref 1.5–4.5)
Glucose: 91 mg/dL (ref 70–99)
Potassium: 3.7 mmol/L (ref 3.5–5.2)
Sodium: 139 mmol/L (ref 134–144)
Total Protein: 7.2 g/dL (ref 6.0–8.5)
eGFR: 74 mL/min/1.73 (ref >59)

## 2024-05-15 NOTE — Assessment & Plan Note (Signed)
 Acknowledges that this is her major stress trigger and is dealing with this best she can, and leaning on faith in a Higher Power to take her through

## 2024-05-15 NOTE — Assessment & Plan Note (Signed)
 Will benfit from epidural , multiple c/o of uncontrolled pain with LLE sciatica

## 2024-05-15 NOTE — Assessment & Plan Note (Signed)
Managed by Endo 

## 2024-05-15 NOTE — Assessment & Plan Note (Signed)
 Hyperlipidemia:Low fat diet discussed and encouraged.   Lipid Panel  Lab Results  Component Value Date   CHOL 201 (H) 05/14/2024   HDL 69 05/14/2024   LDLCALC 113 (H) 05/14/2024   TRIG 110 05/14/2024   CHOLHDL 2.9 05/14/2024     Needs to reduce fried and fatty foods , not at goal

## 2024-05-15 NOTE — Assessment & Plan Note (Signed)
 Controlled, no change in medication

## 2024-05-15 NOTE — Progress Notes (Signed)
 Laura [name]     MRN: [id number]      DOB: November 30, 1950  Chief Complaint  Patient presents with   Medical Management of Chronic Issues    4 month follow up. Was taking otc medication for anemia, unsure of the name, had to quit taking due to causing black hard stools. Is asking for alternative, was told by pharmacist there is an injection     HPI Laura [name] is here for follow up and re-evaluation of chronic medical conditions, medication management and review of any available recent lab and radiology data.  Preventive health is updated, specifically  Cancer screening and Immunization.   Questions or concerns regarding consultations or procedures which the PT has had in the interim are  addressed. The PT denies any adverse reactions to current medications since the last visit. Asking about IV iron as oral iron constipates her Reports poor appetite , which she states is due to the stress of her alcoholic son , who is still able able to keep a job but do nothing else living with her , states looking at him giving away his life is stressing her  out. Does keep involved with the community outside of her home, her husband was alcoolic up to about 5 years ago   ROS Denies recent fever or chills. Denies sinus pressure, nasal congestion, ear pain or sore throat. Denies chest congestion, productive cough or wheezing. Denies chest pains, palpitations and leg swelling Denies abdominal pain, nausea, vomiting,diarrhea or constipation.   Denies dysuria, frequency, hesitancy or incontinence. Chronic back and knee pain, has not had epidural injections yet , update MRI supports their  Denies headaches, seizures, numbness, or tingling. Denies depression, anxiety or insomnia. Denies skin break down or rash.   PE  BP 122/60   Pulse (!) 58   Resp 16   Ht 5' 3 (1.6 m)   Wt 166 lb 1.9 oz (75.4 kg)   SpO2 96%   BMI 29.43 kg/m   Patient alert and oriented and in no cardiopulmonary distress.  HEENT:  No facial asymmetry, EOMI,     Neck supple .  Chest: Clear to auscultation bilaterally.  CVS: S1, S2 no murmurs, no S3.Regular rate.  ABD: Soft non tender.   Ext: No edema  MS: decreased  ROM lumbar spine,  normal in shoulders, hips and  reduced in knees.  Skin: Intact, no ulcerations or rash noted.  Psych: Good eye contact, normal affect. Memory intact not anxious or depressed appearing.  CNS: CN 2-12 intact, power,  normal throughout.no focal deficits noted.   Assessment & Plan  Degenerative joint disease (DJD) of lumbar spine Will benfit from epidural , multiple c/o of uncontrolled pain with LLE sciatica  Lumbar disc disease with radiculopathy Refer for epidural  Mixed hyperlipidemia Hyperlipidemia:Low fat diet discussed and encouraged.   Lipid Panel  Lab Results  Component Value Date   CHOL 201 (H) 05/14/2024   HDL 69 05/14/2024   LDLCALC 113 (H) 05/14/2024   TRIG 110 05/14/2024   CHOLHDL 2.9 05/14/2024     Needs to reduce fried and fatty foods , not at goal  Postsurgical hypothyroidism Managed by Endo  Depression, major, single episode, in partial remission (HCC) Controlled on current medication Encouraged to commi to regular exercise for fun for 30 is each day  GAD (generalized anxiety disorder) Controlled, no change in medication   Stress due to illness of family member Acknowledges that this is her major stress trigger and is  dealing with this best she can, and leaning on faith in a Higher Power to take her through  Gastroesophageal reflux disease Controlled, no change in medication Managed by GI, a lot of symptom of bloating and nausea is stress related , has upcoming GI appt

## 2024-05-15 NOTE — Assessment & Plan Note (Signed)
 Controlled on current medication Encouraged to commi to regular exercise for fun for 30 is each day

## 2024-05-15 NOTE — Assessment & Plan Note (Signed)
 Controlled, no change in medication Managed by GI, a lot of symptom of bloating and nausea is stress related , has upcoming GI appt

## 2024-05-15 NOTE — Assessment & Plan Note (Signed)
Refer for epidural

## 2024-05-16 ENCOUNTER — Ambulatory Visit: Payer: Self-pay | Admitting: Family Medicine

## 2024-06-17 ENCOUNTER — Encounter: Payer: Self-pay | Admitting: Family Medicine

## 2024-06-18 MED ORDER — FUSION PLUS PO CAPS: 1.0000 | ORAL_CAPSULE | Freq: Every day | ORAL | 5 refills | Status: AC

## 2024-06-21 ENCOUNTER — Telehealth: Payer: Self-pay

## 2024-06-21 NOTE — Progress Notes (Signed)
 Pharmacy Quality Measure Review  This patient is appearing on a report for being at risk of failing the adherence measure for cholesterol (statin) medications this calendar year.   Medication: atorvastatin  80 mg daily Last fill date: 02/12/2024 for 100 day supply  Left voicemail for patient to return my call at their convenience.  Woodie Jock, PharmD PGY1 Pharmacy Resident

## 2024-06-24 ENCOUNTER — Encounter: Payer: Self-pay | Admitting: Family Medicine

## 2024-06-28 ENCOUNTER — Other Ambulatory Visit: Payer: Self-pay | Admitting: Gastroenterology

## 2024-06-28 NOTE — Telephone Encounter (Signed)
 Pt has appt the 2nd of Sept. Will address then

## 2024-07-02 ENCOUNTER — Ambulatory Visit: Admitting: Gastroenterology

## 2024-07-02 ENCOUNTER — Encounter: Payer: Self-pay | Admitting: Gastroenterology

## 2024-07-02 VITALS — BP 122/73 | HR 64 | Temp 98.7°F | Ht 63.0 in | Wt 170.6 lb

## 2024-07-02 DIAGNOSIS — K219 Gastro-esophageal reflux disease without esophagitis: Secondary | ICD-10-CM

## 2024-07-02 DIAGNOSIS — D132 Benign neoplasm of duodenum: Secondary | ICD-10-CM

## 2024-07-02 NOTE — Progress Notes (Signed)
 Gastroenterology Office Note     Primary Care Physician:  [name], MD  Primary Gastroenterologist: Dr. Cindie   Chief Complaint   Chief Complaint  Patient presents with   Follow-up    Follow up on GERD , Dyspepsia, and abd pain     History of Present Illness   Laura [name] is a 73 y.o. female presenting today with a history of chronic constipation, GERD, duodenal adenoma in 2024, chronic abdominal pain, normocytic anemia secondary to alpha thalassemia trait nocturnal flushing, weight loss, and work-up as outlined below negative for carcinoid. In interim from last visit, she underwent small bowel enteroscopy by Dr. Wilhelmenia Feb 2025 with resection and APC, clip placement, tattooing of site.   One BM daily. No supplemental fiber. Omeprazole  BID. No GERD exacerbations. Denies any abdominal pain.  No overt GI bleeding.,  Denying any melena or hematochezia.  She states she has no abdominal pain.  She feels wonderful.  She feels like this sulfur  fate is helping her with bowel movements.  She is taking this once a day.  She would rather not come off of this.  She has seen hematology for anemia.   Enteroscopy Feb 2025: No gross lesions in the entire esophagus. Z-line                            irregular, 37 cm from the incisors.                           - Multiple gastric polyps - fundic gland in                            appearance..                           - No other gross lesions in the entire stomach.                           - Normal mucosa was found in the duodenal bulb, in                            the first portion of the duodenum, in the second                            portion of the duodenum, in the major papilla and                            in the third portion of the duodenum.                           - A single duodenal polyp found in the third/fourth                            portion of the duodenum. Positioning as documented                             above very difficult requiring multiple scopes and  lift/underwater flotation resection attempt.                            Resected and retrieved with cold snare. Treated                            with argon plasma coagulation (APC) to the margin                            and base. Injected PuraStat for hemostasis                            purposes. A single clip (MR conditional) was placed                            for demarcation purposes on the area of resection.                            Tattooed proximal to the resection site for future                            purposes.                            Path with duodenal adenoma and low grade dysplasia.  Surveillance in 9-12 months    EGD June 2024: normal esophagus, one 10 mm duodenal polyp, gastritis. Of note, 2 pieces of gastric mucosa were inadvertently put into jar 1 with duodenal polyp biopsies. Duodenal adenoma.    Colonoscopy 2022 : fair prep, hemorrhoids, diverticulosis, stool in entire colon but cleared with copious amounts of water .    EGD June 2021 with a few fundic gland polyps, otherwise unremarkable.  Biopsies did show chronic gastritis negative for H. pylori.    Additional workup has included:  CT June 2021 with no significant findings.  HIDA September 2021 with slightly low EF at 27%.  labs that had been completed included catecholamines,metanephrines, ESR, CRP, tryptase all of which were ok. 5 HIAA elevated at 13.6. TSH borderline low. CT A/P with and without contrast 10/06/20 with no acute findings.  NM PET with Dotatate 11/13/2020 with no evidence of well-differentiated neuroendocrine tumor within the abdomen or pelvis.  No evidence of metastatic disease. CTA: no occlusive disease                            Past Medical History:  Diagnosis Date   Anxiety    At low risk for fall 08/15/2018   Cystitis    Depression    Gastritis    GERD (gastroesophageal reflux disease)     Hashimoto's thyroiditis    Hx    Hyperlipidemia    Hypertension    Hypothyroidism    Normocytic anemia 2009 Hb 9.9-11.1   Osteopenia    Shoulder pain    Urinary incontinence     Past Surgical History:  Procedure Laterality Date   Bilateral tubal ligation     BIOPSY  04/21/2020   Procedure: BIOPSY;  Surgeon: Shaaron Lamar HERO, MD;  Location: [facility];  Service: Endoscopy;;   BIOPSY  04/20/2023   Procedure:  BIOPSY;  Surgeon: Cindie Carlin POUR, DO;  [facility]: [facility];  Service: Endoscopy;;   BREAST EXCISIONAL BIOPSY  2010   Excisional biopsy of benign left breast mass -lopoma    COLONOSCOPY  08/26/2011   SLF: 1. Internal hemorrhoids   COLONOSCOPY N/A 10/06/2015   DOQ:jwjo fissure or internal hemorrhoids/mild sigmoid colitis. benign colonic path    COLONOSCOPY WITH PROPOFOL  N/A 03/15/2021   Procedure: COLONOSCOPY WITH PROPOFOL ;  Surgeon: Cindie Carlin POUR, DO;  Location: [facility];  Service: Endoscopy;  Laterality: N/A;  3:00pm   colonscopy  2005   Dr. Claudene   ENDOSCOPIC MUCOSAL RESECTION N/A 12/11/2023   Procedure: ENDOSCOPIC MUCOSAL RESECTION;  Surgeon: Wilhelmenia Aloha Raddle., MD;  Location: [facility];  Service: Gastroenterology;  Laterality: N/A;   ENTEROSCOPY N/A 07/25/2023   Procedure: ENTEROSCOPY;  Surgeon: Cindie Carlin POUR, DO;  [facility]: [facility];  Service: Endoscopy;  Laterality: N/A;  1:45 pm, asa 2   ENTEROSCOPY N/A 12/11/2023   Procedure: ENTEROSCOPY;  Surgeon: Wilhelmenia Aloha Raddle., MD;  Location: [facility];  Service: Gastroenterology;  Laterality: N/A;   ESOPHAGOGASTRODUODENOSCOPY  12/2009   chronic gastritis   ESOPHAGOGASTRODUODENOSCOPY N/A 05/17/2017   Dr. Harvey; gastritis, normal small bowel biopsies, multiple gastric polyps. fundic gland polyps   ESOPHAGOGASTRODUODENOSCOPY (EGD) WITH PROPOFOL  N/A 04/21/2020   Rourk: multiple gastric polyps, biopsy of one c/w fundic gland. gastric biopsy showed mild chronic gastritis but no H.pylori    ESOPHAGOGASTRODUODENOSCOPY (EGD) WITH PROPOFOL  N/A 04/20/2023   Procedure: ESOPHAGOGASTRODUODENOSCOPY (EGD) WITH PROPOFOL ;  Surgeon: Cindie Carlin POUR, DO;  [facility]: [facility];  Service: Endoscopy;  Laterality: N/A;  1:00 pm, asa 3, pt knows to arrive at 6:30   GIVENS CAPSULE STUDY N/A 05/30/2017   gastritis, no source for anemia identified   HEMOSTASIS CLIP PLACEMENT  12/11/2023   Procedure: HEMOSTASIS CLIP PLACEMENT;  Surgeon: Wilhelmenia Aloha Raddle., MD;  Location: [facility];  Service: Gastroenterology;;   HEMOSTASIS CONTROL  12/11/2023   Procedure: HEMOSTASIS CONTROL;  Surgeon: Wilhelmenia Aloha Raddle., MD;  Location: [facility];  Service: Gastroenterology;;   HOT HEMOSTASIS N/A 12/11/2023   Procedure: HOT HEMOSTASIS (ARGON PLASMA COAGULATION/BICAP);  Surgeon: Wilhelmenia Aloha Raddle., MD;  Location: [facility];  Service: Gastroenterology;  Laterality: N/A;   left knee surgery Left 12/30/2015   PARTIAL KNEE ARTHROPLASTY Left 08/09/2017   Procedure: LEFT UNICOMPARTMENTAL KNEE;  Surgeon: Melodi Lerner, MD;  Location: [facility];  Service: Orthopedics;  Laterality: Left;   POLYPECTOMY  04/21/2020   Procedure: POLYPECTOMY;  Surgeon: Shaaron Lamar HERO, MD;  [facility]: [facility];  Service: Endoscopy;;  gastric   Resection of left lobe of thyroid      right carpal tunnel release     rt. knee athroscopy  2004   SAVORY DILATION N/A 05/17/2017   Procedure: SAVORY DILATION;  Surgeon: Harvey Margo CROME, MD;  Location: [facility];  Service: Endoscopy;  Laterality: N/A;   SUBMUCOSAL LIFTING INJECTION  12/11/2023   Procedure: SUBMUCOSAL LIFTING INJECTION;  Surgeon: Wilhelmenia Aloha Raddle., MD;  Location: [facility];  Service: Gastroenterology;;   SUBMUCOSAL TATTOO INJECTION  12/11/2023   Procedure: SUBMUCOSAL TATTOO INJECTION;  Surgeon: Wilhelmenia Aloha Raddle., MD;  Location: [facility];  Service: Gastroenterology;;   TOTAL ABDOMINAL HYSTERECTOMY  1994   UMBILICAL HERNIA REPAIR     Urethral  dilation for stenosis  2009    Current Outpatient Medications  Medication Sig Dispense Refill   acetaminophen  (TYLENOL ) 500 MG tablet Take 1 tablet (500 mg total) by mouth every  6 (six) hours as needed. 30 tablet 0   amLODipine  (NORVASC ) 5 MG tablet TAKE 1 TABLET BY MOUTH DAILY 100 tablet 2   atorvastatin  (LIPITOR) 80 MG tablet TAKE 1 TABLET BY MOUTH ONCE  DAILY 90 tablet 3   Calcium  Carbonate-Vit D-Min (CALCIUM  1200) 1200-1000 MG-UNIT CHEW Take one tablet by mouth two times daily 60 tablet 5   citalopram  (CELEXA ) 40 MG tablet TAKE 1 TABLET BY MOUTH DAILY 90 tablet 3   furosemide  (LASIX ) 20 MG tablet TAKE 1 TABLET(20 MG) BY MOUTH DAILY 30 tablet 1   gabapentin  (NEURONTIN ) 100 MG capsule TAKE 2 CAPSULES BY MOUTH AT  BEDTIME FOR NEUROPATHIC PAIN 200 capsule 2   hydrochlorothiazide  (HYDRODIURIL ) 25 MG tablet TAKE 1 TABLET BY MOUTH DAILY 100 tablet 2   Iron-FA-B Cmp-C-Biot-Probiotic (FUSION PLUS) CAPS Take 1 capsule by mouth daily. 30 capsule 5   levothyroxine  (SYNTHROID ) 88 MCG tablet TAKE 1 TABLET BY MOUTH DAILY  BEFORE BREAKFAST 100 tablet 2   Multiple Vitamin (MULTIVITAMIN WITH MINERALS) TABS tablet Take 1 tablet by mouth daily.     omeprazole  (PRILOSEC) 20 MG capsule TAKE 1 CAPSULE(20 MG) BY MOUTH TWICE DAILY BEFORE A MEAL 60 capsule 3   Polyethyl Glycol-Propyl Glycol (SYSTANE OP) Place 1 drop into both eyes daily as needed (dry eyes).     potassium chloride  (KLOR-CON ) 10 MEQ tablet TAKE 1 TABLET BY MOUTH DAILY 30 tablet 5   sucralfate  (CARAFATE ) 1 g tablet Take 1 tablet (1 g total) by mouth 2 (two) times daily. (Patient taking differently: Take 1 g by mouth daily.) 56 tablet 6   Current Facility-Administered Medications  Medication Dose Route Frequency Provider Last Rate Last Admin   lidocaine  (PF) (XYLOCAINE ) 1 % injection 4 mL  4 mL Other Once Golda Lynwood PARAS, MD        Allergies as of 07/02/2024 - Review Complete 07/02/2024  Allergen Reaction Noted   Cozaar  [losartan  potassium] Cough  07/04/2018   Dilaudid [hydromorphone hcl] Other (See Comments) 03/01/2011   Levaquin [levofloxacin] Other (See Comments) 07/28/2014   Nsaids Other (See Comments) 01/13/2010   Promethazine   05/17/2017    Family History  Problem Relation Age of Onset   Pneumonia Mother    Healthy Father    Colon cancer Neg Hx    Liver disease Neg Hx    Esophageal cancer Neg Hx    Inflammatory bowel disease Neg Hx    Pancreatic cancer Neg Hx    Rectal cancer Neg Hx    Stomach cancer Neg Hx    Colon polyps Neg Hx     Social History   Socioeconomic History   Marital status: Married    Spouse name: Not on file   Number of children: 3   Years of education: Not on file   Highest education level: Not on file  Occupational History   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    Occupation: retired  Tobacco Use   Smoking status: Former    Current packs/day: 0.00    Average packs/day: 0.3 packs/day for 1 year (0.3 ttl pk-yrs)    Types: Cigarettes    Start date: 08/26/1971    Quit date: 08/25/1972    Years since quitting: 51.8    Passive exposure: Past   Smokeless tobacco: Never  Vaping Use   Vaping status: Never Used  Substance and Sexual Activity   Alcohol use: No   Drug use: No   Sexual activity: Not Currently    Birth control/protection: Surgical  Comment: hyst  Other Topics Concern   Not on file  Social History Narrative   Not on file   Social Drivers of Health   Financial Resource Strain: Low Risk  (09/27/2023)   Overall Financial Resource Strain (CARDIA)    Difficulty of Paying Living Expenses: Not hard at all  Food Insecurity: No Food Insecurity (09/27/2023)   Hunger Vital Sign    Worried About Running Out of Food in the Last Year: Never true    Ran Out of Food in the Last Year: Never true  Transportation Needs: No Transportation Needs (09/27/2023)   PRAPARE - Administrator, Civil Service (Medical): No    Lack of Transportation (Non-Medical): No   Physical Activity: Inactive (09/27/2023)   Exercise Vital Sign    Days of Exercise per Week: 0 days    Minutes of Exercise per Session: 0 min  Stress: Stress Concern Present (09/27/2023)   [employer] of Occupational Health - Occupational Stress Questionnaire    Feeling of Stress : To some extent  Social Connections: Moderately Integrated (09/27/2023)   Social Connection and Isolation Panel    Frequency of Communication with Friends and Family: Three times a week    Frequency of Social Gatherings with Friends and Family: Three times a week    Attends Religious Services: More than 4 times per year    Active Member of Clubs or Organizations: No    Attends Banker Meetings: Never    Marital Status: Married  Catering manager Violence: Not At Risk (09/27/2023)   Humiliation, Afraid, Rape, and Kick questionnaire    Fear of Current or Ex-Partner: No    Emotionally Abused: No    Physically Abused: No    Sexually Abused: No     Review of Systems   Gen: Denies any fever, chills, fatigue, weight loss, lack of appetite.  CV: Denies chest pain, heart palpitations, peripheral edema, syncope.  Resp: Denies shortness of breath at rest or with exertion. Denies wheezing or cough.  GI: Denies dysphagia or odynophagia. Denies jaundice, hematemesis, fecal incontinence. GU : Denies urinary burning, urinary frequency, urinary hesitancy MS: Denies joint pain, muscle weakness, cramps, or limitation of movement.  Derm: Denies rash, itching, dry skin Psych: Denies depression, anxiety, memory loss, and confusion Heme: Denies bruising, bleeding, and enlarged lymph nodes.   Physical Exam   BP 122/73   Pulse 64   Temp 98.7 F (37.1 C)   Ht 5' 3 (1.6 m)   Wt 170 lb 9.6 oz (77.4 kg)   BMI 30.22 kg/m  General:   Alert and oriented. Pleasant and cooperative. Well-nourished and well-developed.  Head:  Normocephalic and atraumatic. Eyes:  Without icterus Abdomen:  +BS, soft,  non-tender and non-distended. No HSM noted. No guarding or rebound. No masses appreciated.  Rectal:  Deferred  Msk:  Symmetrical without gross deformities. Normal posture. Extremities:  Without edema. Neurologic:  Alert and  oriented x4;  grossly normal neurologically. Skin:  Intact without significant lesions or rashes. Psych:  Alert and cooperative. Normal mood and affect.   Assessment   Laura [name] is a 72 y.o. female presenting today with a history of  chronic constipation, GERD, duodenal adenoma in 2024 s/p recent enteroscopy with resection and clip placement, chronic abdominal pain, normocytic anemia secondary to alpha thalassemia trait nocturnal flushing, weight loss, and extensive evaluation as noted above.  She returns today for routine follow-up.    She underwent enteroscopy in February 2025, and this  showed the polyp as a duodenal adenoma  with low-grade dysplasia.  She is on the recall for surveillance enteroscopy around the 9 to 41-month mark, which will be around October through the latest February 2025.  She notes constipation has resolved.  GERD is well-controlled on omeprazole  twice daily.  We discussed decreasing this down to once a day.  I also discussed weaning off of sulcal fate, but she would rather continue this just once a day.  Regarding weight loss, she has maintained a steady weight, she has had thorough evaluation as above.  Continues to follow with hematology with anemia history.  If she were to have any evidence for IDA or worsening anemia, we will update colonoscopy, as last was in 2022.  However she is on the 10-year recall list for now.  PLAN    Wean down to omeprazole  just once a day Can continue soaker fate once daily Keep follow-up with Dr. Wilhelmenia Continue follow-up with hematology Return in 6 months or sooner if needed   Therisa MICAEL Stager, PhD, ANP-BC [facility]

## 2024-07-02 NOTE — Patient Instructions (Addendum)
 Decrease omeprazole  to just once a day, 30 minutes before breakfast.   You will have a repeat enteroscopy with Dr. Wilhelmenia later this year. They will contact you to arrange.  We will see you back in 6 months!   I enjoyed seeing you again today! I value our relationship and want to provide genuine, compassionate, and quality care. You may receive a survey regarding your visit with me, and I welcome your feedback! Thanks so much for taking the time to complete this. I look forward to seeing you again.      Therisa MICAEL Stager, PhD, ANP-BC [facility]

## 2024-07-03 ENCOUNTER — Ambulatory Visit: Payer: Medicare Other | Admitting: Gastroenterology

## 2024-07-09 ENCOUNTER — Telehealth: Payer: Self-pay | Admitting: Family Medicine

## 2024-07-09 ENCOUNTER — Encounter: Payer: Self-pay | Admitting: Nurse Practitioner

## 2024-07-09 ENCOUNTER — Ambulatory Visit (INDEPENDENT_AMBULATORY_CARE_PROVIDER_SITE_OTHER): Admitting: Nurse Practitioner

## 2024-07-09 VITALS — BP 112/68 | HR 67 | Ht 63.0 in | Wt 168.0 lb

## 2024-07-09 DIAGNOSIS — R2681 Unsteadiness on feet: Secondary | ICD-10-CM

## 2024-07-09 DIAGNOSIS — M47816 Spondylosis without myelopathy or radiculopathy, lumbar region: Secondary | ICD-10-CM | POA: Diagnosis not present

## 2024-07-09 DIAGNOSIS — G8929 Other chronic pain: Secondary | ICD-10-CM

## 2024-07-09 DIAGNOSIS — M792 Neuralgia and neuritis, unspecified: Secondary | ICD-10-CM

## 2024-07-09 MED ORDER — KETOROLAC TROMETHAMINE 60 MG/2ML IM SOLN
60.0000 mg | Freq: Once | INTRAMUSCULAR | Status: AC
  Administered 2024-07-09: 60 mg via INTRAMUSCULAR

## 2024-07-09 NOTE — Telephone Encounter (Signed)
 Patient would like to go ahead and setup the referral for neurosurgereon as discuss in the visit today.

## 2024-07-09 NOTE — Telephone Encounter (Signed)
 Patient came by the office dropped off a note to give provider, note was given to the provider asked if she needs to take the magnesium  and gabepentin at night? She could not find anything on her mychart. Like to go forward setting up the referral to neurosurgeon contact patient at [phone] if no answer leave detail message on machine.

## 2024-07-09 NOTE — Patient Instructions (Signed)
 1) Magnesium  OTC nightly at bedtime for RLS and muscle relaxation 2) Please consider neurosurg consult for narrowing of lumbar lower spinal region 3) Nursing to give lidocaine  injection today

## 2024-07-09 NOTE — Progress Notes (Signed)
 Established Patient Office Visit  Subjective:  Patient ID: Laura [name], female    DOB: 14-Feb-1951  Age: 73 y.o. MRN: [id number]  Chief Complaint  Patient presents with   Back Pain    Patient here today for an acute visit reporting right sided lower back pain.  Patient does have RLS at night.  She takes gabapentin , does not want to increase the dose.  She does agree to Magnesium  OTC nightly at bedtime.  Patient requests a lidocaine  injection that she has had before to help relieve her symptoms.    Back Pain    No other concerns at this time.   Past Medical History:  Diagnosis Date   Anxiety    At low risk for fall 08/15/2018   Cystitis    Depression    Gastritis    GERD (gastroesophageal reflux disease)    Hashimoto's thyroiditis    Hx    Hyperlipidemia    Hypertension    Hypothyroidism    Normocytic anemia 2009 Hb 9.9-11.1   Osteopenia    Shoulder pain    Urinary incontinence     Past Surgical History:  Procedure Laterality Date   Bilateral tubal ligation     BIOPSY  04/21/2020   Procedure: BIOPSY;  Surgeon: Shaaron Lamar HERO, MD;  Location: [facility];  Service: Endoscopy;;   BIOPSY  04/20/2023   Procedure: BIOPSY;  Surgeon: Cindie Carlin POUR, DO;  Location: [facility];  Service: Endoscopy;;   BREAST EXCISIONAL BIOPSY  2010   Excisional biopsy of benign left breast mass -lopoma    COLONOSCOPY  08/26/2011   SLF: 1. Internal hemorrhoids   COLONOSCOPY N/A 10/06/2015   DOQ:jwjo fissure or internal hemorrhoids/mild sigmoid colitis. benign colonic path    COLONOSCOPY WITH PROPOFOL  N/A 03/15/2021   Procedure: COLONOSCOPY WITH PROPOFOL ;  Surgeon: Cindie Carlin POUR, DO;  [facility]: [facility];  Service: Endoscopy;  Laterality: N/A;  3:00pm   colonscopy  2005   Dr. Claudene   ENDOSCOPIC MUCOSAL RESECTION N/A 12/11/2023   Procedure: ENDOSCOPIC MUCOSAL RESECTION;  Surgeon: Wilhelmenia Aloha Raddle., MD;  Location: [facility];  Service: Gastroenterology;  Laterality:  N/A;   ENTEROSCOPY N/A 07/25/2023   Procedure: ENTEROSCOPY;  Surgeon: Cindie Carlin POUR, DO;  Location: [facility];  Service: Endoscopy;  Laterality: N/A;  1:45 pm, asa 2   ENTEROSCOPY N/A 12/11/2023   Procedure: ENTEROSCOPY;  Surgeon: Wilhelmenia Aloha Raddle., MD;  Location: [facility];  Service: Gastroenterology;  Laterality: N/A;   ESOPHAGOGASTRODUODENOSCOPY  12/2009   chronic gastritis   ESOPHAGOGASTRODUODENOSCOPY N/A 05/17/2017   Dr. Harvey; gastritis, normal small bowel biopsies, multiple gastric polyps. fundic gland polyps   ESOPHAGOGASTRODUODENOSCOPY (EGD) WITH PROPOFOL  N/A 04/21/2020   Rourk: multiple gastric polyps, biopsy of one c/w fundic gland. gastric biopsy showed mild chronic gastritis but no H.pylori   ESOPHAGOGASTRODUODENOSCOPY (EGD) WITH PROPOFOL  N/A 04/20/2023   Procedure: ESOPHAGOGASTRODUODENOSCOPY (EGD) WITH PROPOFOL ;  Surgeon: Cindie Carlin POUR, DO;  [facility]: [facility];  Service: Endoscopy;  Laterality: N/A;  1:00 pm, asa 3, pt knows to arrive at 6:30   GIVENS CAPSULE STUDY N/A 05/30/2017   gastritis, no source for anemia identified   HEMOSTASIS CLIP PLACEMENT  12/11/2023   Procedure: HEMOSTASIS CLIP PLACEMENT;  Surgeon: Wilhelmenia Aloha Raddle., MD;  Location: [facility];  Service: Gastroenterology;;   HEMOSTASIS CONTROL  12/11/2023   Procedure: HEMOSTASIS CONTROL;  Surgeon: Wilhelmenia Aloha Raddle., MD;  Location: [facility];  Service: Gastroenterology;;   HOT HEMOSTASIS N/A 12/11/2023  Procedure: HOT HEMOSTASIS (ARGON PLASMA COAGULATION/BICAP);  Surgeon: Wilhelmenia Aloha Raddle., MD;  Location: [facility];  Service: Gastroenterology;  Laterality: N/A;   left knee surgery Left 12/30/2015   PARTIAL KNEE ARTHROPLASTY Left 08/09/2017   Procedure: LEFT UNICOMPARTMENTAL KNEE;  Surgeon: Melodi Lerner, MD;  Location: [facility];  Service: Orthopedics;  Laterality: Left;   POLYPECTOMY  04/21/2020   Procedure: POLYPECTOMY;  Surgeon: Shaaron Lamar HERO, MD;  [facility];  Service: Endoscopy;;  gastric   Resection of left lobe of thyroid      right carpal tunnel release     rt. knee athroscopy  2004   SAVORY DILATION N/A 05/17/2017   Procedure: SAVORY DILATION;  Surgeon: Harvey Margo CROME, MD;  Location: [facility];  Service: Endoscopy;  Laterality: N/A;   SUBMUCOSAL LIFTING INJECTION  12/11/2023   Procedure: SUBMUCOSAL LIFTING INJECTION;  Surgeon: Wilhelmenia Aloha Raddle., MD;  Location: [facility];  Service: Gastroenterology;;   SUBMUCOSAL TATTOO INJECTION  12/11/2023   Procedure: SUBMUCOSAL TATTOO INJECTION;  Surgeon: Wilhelmenia Aloha Raddle., MD;  Location: [facility];  Service: Gastroenterology;;   TOTAL ABDOMINAL HYSTERECTOMY  1994   UMBILICAL HERNIA REPAIR     Urethral dilation for stenosis  2009    Social History   Socioeconomic History   Marital status: Married    Spouse name: Not on file   Number of children: 3   Years of education: Not on file   Highest education level: Not on file  Occupational History   Occupation: Designer, 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    Occupation: retired  Tobacco Use   Smoking status: Former    Current packs/day: 0.00    Average packs/day: 0.3 packs/day for 1 year (0.3 ttl pk-yrs)    Types: Cigarettes    Start date: 08/26/1971    Quit date: 08/25/1972    Years since quitting: 51.9    Passive exposure: Past   Smokeless tobacco: Never  Vaping Use   Vaping status: Never Used  Substance and Sexual Activity   Alcohol use: No   Drug use: No   Sexual activity: Not Currently    Birth control/protection: Surgical    Comment: hyst  Other Topics Concern   Not on file  Social History Narrative   Not on file   Social Drivers of Health   Financial Resource Strain: Low Risk  (09/27/2023)   Overall Financial Resource Strain (CARDIA)    Difficulty of Paying Living Expenses: Not hard at all  Food Insecurity: No Food Insecurity (09/27/2023)   Hunger Vital Sign    Worried About Running Out of Food in  the Last Year: Never true    Ran Out of Food in the Last Year: Never true  Transportation Needs: No Transportation Needs (09/27/2023)   PRAPARE - Administrator, Civil Service (Medical): No    Lack of Transportation (Non-Medical): No  Physical Activity: Inactive (09/27/2023)   Exercise Vital Sign    Days of Exercise per Week: 0 days    Minutes of Exercise per Session: 0 min  Stress: Stress Concern Present (09/27/2023)   [employer] of Occupational Health - Occupational Stress Questionnaire    Feeling of Stress : To some extent  Social Connections: Moderately Integrated (09/27/2023)   Social Connection and Isolation Panel    Frequency of Communication with Friends and Family: Three times a week    Frequency of Social Gatherings with Friends and Family: Three times a week    Attends Religious Services: More  than 4 times per year    Active Member of Clubs or Organizations: No    Attends Banker Meetings: Never    Marital Status: Married  Catering manager Violence: Not At Risk (09/27/2023)   Humiliation, Afraid, Rape, and Kick questionnaire    Fear of Current or Ex-Partner: No    Emotionally Abused: No    Physically Abused: No    Sexually Abused: No    Family History  Problem Relation Age of Onset   Pneumonia Mother    Healthy Father    Colon cancer Neg Hx    Liver disease Neg Hx    Esophageal cancer Neg Hx    Inflammatory bowel disease Neg Hx    Pancreatic cancer Neg Hx    Rectal cancer Neg Hx    Stomach cancer Neg Hx    Colon polyps Neg Hx     Allergies  Allergen Reactions   Cozaar  [Losartan  Potassium] Cough   Dilaudid [Hydromorphone Hcl] Other (See Comments)    Confusion;altered state of mind   Levaquin [Levofloxacin] Other (See Comments)    Dry throat   Nsaids Other (See Comments)    per GI pt no longer to use NSAIDS    Promethazine      Legs itching, RESTLESS LEGS    Outpatient Medications Prior to Visit  Medication Sig    acetaminophen  (TYLENOL ) 500 MG tablet Take 1 tablet (500 mg total) by mouth every 6 (six) hours as needed.   amLODipine  (NORVASC ) 5 MG tablet TAKE 1 TABLET BY MOUTH DAILY   atorvastatin  (LIPITOR) 80 MG tablet TAKE 1 TABLET BY MOUTH ONCE  DAILY   Calcium  Carbonate-Vit D-Min (CALCIUM  1200) 1200-1000 MG-UNIT CHEW Take one tablet by mouth two times daily   citalopram  (CELEXA ) 40 MG tablet TAKE 1 TABLET BY MOUTH DAILY   furosemide  (LASIX ) 20 MG tablet TAKE 1 TABLET(20 MG) BY MOUTH DAILY   gabapentin  (NEURONTIN ) 100 MG capsule TAKE 2 CAPSULES BY MOUTH AT  BEDTIME FOR NEUROPATHIC PAIN   hydrochlorothiazide  (HYDRODIURIL ) 25 MG tablet TAKE 1 TABLET BY MOUTH DAILY   Iron-FA-B Cmp-C-Biot-Probiotic (FUSION PLUS) CAPS Take 1 capsule by mouth daily.   levothyroxine  (SYNTHROID ) 88 MCG tablet TAKE 1 TABLET BY MOUTH DAILY  BEFORE BREAKFAST   Multiple Vitamin (MULTIVITAMIN WITH MINERALS) TABS tablet Take 1 tablet by mouth daily.   omeprazole  (PRILOSEC) 20 MG capsule TAKE 1 CAPSULE(20 MG) BY MOUTH TWICE DAILY BEFORE A MEAL   Polyethyl Glycol-Propyl Glycol (SYSTANE OP) Place 1 drop into both eyes daily as needed (dry eyes).   potassium chloride  (KLOR-CON ) 10 MEQ tablet TAKE 1 TABLET BY MOUTH DAILY   sucralfate  (CARAFATE ) 1 g tablet Take 1 tablet (1 g total) by mouth 2 (two) times daily. (Patient taking differently: Take 1 g by mouth daily.)   Facility-Administered Medications Prior to Visit  Medication Dose Route Frequency Provider   lidocaine  (PF) (XYLOCAINE ) 1 % injection 4 mL  4 mL Other Once Golda Lynwood PARAS, MD    Review of Systems  Musculoskeletal:  Positive for back pain.       Objective:   BP 112/68   Pulse 67   Ht 5' 3 (1.6 m)   Wt 168 lb (76.2 kg)   SpO2 96%   BMI 29.76 kg/m   Vitals:   07/09/24 1012  BP: 112/68  Pulse: 67  Height: 5' 3 (1.6 m)  Weight: 168 lb (76.2 kg)  SpO2: 96%  BMI (Calculated): 29.77    Physical Exam Vitals  and nursing note reviewed.  Constitutional:       Appearance: Normal appearance.  HENT:     Head: Normocephalic.     Nose: Nose normal.     Mouth/Throat:     Mouth: Mucous membranes are moist.  Cardiovascular:     Rate and Rhythm: Normal rate and regular rhythm.     Pulses: Normal pulses.     Heart sounds: Normal heart sounds.  Pulmonary:     Effort: Pulmonary effort is normal.     Breath sounds: Normal breath sounds.  Musculoskeletal:        General: Tenderness present.     Cervical back: Normal range of motion and neck supple.  Skin:    General: Skin is warm and dry.  Neurological:     Mental Status: She is alert and oriented to person, place, and time.  Psychiatric:        Mood and Affect: Mood normal.        Behavior: Behavior normal.      No results found for any visits on 07/09/24.  Recent Results (from the past 2160 hours)  Lipid panel     Status: Abnormal   Collection Time: 05/14/24  9:54 AM  Result Value Ref Range   Cholesterol, Total 201 (H) 100 - 199 mg/dL   Triglycerides 889 0 - 149 mg/dL   HDL 69 >60 mg/dL   VLDL Cholesterol Cal 19 5 - 40 mg/dL   LDL Chol Calc (NIH) 886 (H) 0 - 99 mg/dL   Chol/HDL Ratio 2.9 0.0 - 4.4 ratio    Comment:                                   T. Chol/HDL Ratio                                             Men  Women                               1/2 Avg.Risk  3.4    3.3                                   Avg.Risk  5.0    4.4                                2X Avg.Risk  9.6    7.1                                3X Avg.Risk 23.4   11.0   CMP14+EGFR     Status: None   Collection Time: 05/14/24  9:54 AM  Result Value Ref Range   Glucose 91 70 - 99 mg/dL   BUN 14 8 - 27 mg/dL   Creatinine, Ser 9.16 0.57 - 1.00 mg/dL   eGFR 74 >40 fO/fpw/8.26   BUN/Creatinine Ratio 17 12 - 28   Sodium 139 134 - 144 mmol/L   Potassium 3.7 3.5 - 5.2 mmol/L   Chloride 100 96 - 106 mmol/L  CO2 22 20 - 29 mmol/L   Calcium  10.2 8.7 - 10.3 mg/dL   Total Protein 7.2 6.0 - 8.5 g/dL   Albumin 4.3 3.8 -  4.8 g/dL   Globulin, Total 2.9 1.5 - 4.5 g/dL   Bilirubin Total 0.7 0.0 - 1.2 mg/dL   Alkaline Phosphatase 90 44 - 121 IU/L   AST 21 0 - 40 IU/L   ALT 25 0 - 32 IU/L      Assessment & Plan: 1) Magnesium  OTC nightly at bedtime for RLS and muscle relaxation 2) Please consider neurosurg consult for narrowing of lumbar lower spinal region 3) Nursing to give lidocaine  injection today   Problem List Items Addressed This Visit   None   No follow-ups on file.   Total time spent: 20 minutes  Neale Carpen, NP  07/09/2024   This document may have been prepared by [facility] Voice Recognition software and as such may include unintentional dictation errors.

## 2024-07-09 NOTE — Addendum Note (Signed)
 Addended by: DYANE MOATS D on: 07/09/2024 10:57 AM   Modules accepted: Orders

## 2024-07-09 NOTE — Addendum Note (Signed)
 Addended by: GLENNON SAND on: 07/09/2024 03:16 PM   Modules accepted: Orders

## 2024-07-10 ENCOUNTER — Other Ambulatory Visit: Payer: Self-pay | Admitting: Nurse Practitioner

## 2024-07-10 ENCOUNTER — Other Ambulatory Visit: Payer: Self-pay | Admitting: Family Medicine

## 2024-07-10 DIAGNOSIS — M5116 Intervertebral disc disorders with radiculopathy, lumbar region: Secondary | ICD-10-CM

## 2024-07-10 DIAGNOSIS — M545 Low back pain, unspecified: Secondary | ICD-10-CM

## 2024-07-10 DIAGNOSIS — M47816 Spondylosis without myelopathy or radiculopathy, lumbar region: Secondary | ICD-10-CM

## 2024-07-10 NOTE — Telephone Encounter (Signed)
 Referral placed.

## 2024-07-10 NOTE — Telephone Encounter (Signed)
 Neuro surgery referral entered as discussed during visit

## 2024-07-15 ENCOUNTER — Ambulatory Visit (HOSPITAL_COMMUNITY)
Admission: RE | Admit: 2024-07-15 | Discharge: 2024-07-15 | Disposition: A | Discharge status: Home / Self Care | Source: Ambulatory Visit | Attending: Nurse Practitioner | Admitting: Nurse Practitioner

## 2024-07-15 DIAGNOSIS — M5116 Intervertebral disc disorders with radiculopathy, lumbar region: Secondary | ICD-10-CM | POA: Insufficient documentation

## 2024-07-15 MED ORDER — GADOBUTROL 1 MMOL/ML IV SOLN
7.0000 mL | Freq: Once | INTRAVENOUS | Status: AC | PRN
  Administered 2024-07-15: 7 mL via INTRAVENOUS

## 2024-07-24 ENCOUNTER — Ambulatory Visit

## 2024-08-02 NOTE — Progress Notes (Deleted)
 Referring Physician:  Glennon Sand, NP No address on file  Primary Physician:  [name], MD  History of Present Illness: 08/02/2024*** [name] has a history of HTN, GERD, postsurgical hypothyroidism, mixed hyperlipidemia, GAD, depression, prediabetes, gastritis  Low back pain that radiates down the right leg.  Duration: *** Location: *** Quality: *** Severity: ***  Precipitating: aggravated by *** Modifying factors: made better by *** Weakness: none Timing: ***  Tobacco use: Does not smoke.   Bowel/Bladder Dysfunction: none  Conservative measures:  Physical therapy: *** has participated in PT at [facility] initial evaluation on 11/23/23 with 5 visits through 12/07/23 Multimodal medical therapy including regular antiinflammatories: *** Gabapentin , Tylenol , Tramadol , Methocarbamol  Injections: no epidural steroid injections  Past Surgery: ***none  Laura [name] has ***no symptoms of cervical myelopathy.  The symptoms are causing a significant impact on the patient's life.   Review of Systems:  A 10 point review of systems is negative, except for the pertinent positives and negatives detailed in the HPI.  Past Medical History: Past Medical History:  Diagnosis Date   Anxiety    At low risk for fall 08/15/2018   Cystitis    Depression    Gastritis    GERD (gastroesophageal reflux disease)    Hashimoto's thyroiditis    Hx    Hyperlipidemia    Hypertension    Hypothyroidism    Normocytic anemia 2009 Hb 9.9-11.1   Osteopenia    Shoulder pain    Urinary incontinence     Past Surgical History: Past Surgical History:  Procedure Laterality Date   Bilateral tubal ligation     BIOPSY  04/21/2020   Procedure: BIOPSY;  Surgeon: Shaaron Lamar HERO, MD;  [facility]: [facility];  Service: Endoscopy;;   BIOPSY  04/20/2023   Procedure: BIOPSY;  Surgeon: Cindie Carlin POUR, DO;  [facility]: [facility];  Service: Endoscopy;;   BREAST EXCISIONAL BIOPSY  2010    Excisional biopsy of benign left breast mass -lopoma    COLONOSCOPY  08/26/2011   SLF: 1. Internal hemorrhoids   COLONOSCOPY N/A 10/06/2015   DOQ:jwjo fissure or internal hemorrhoids/mild sigmoid colitis. benign colonic path    COLONOSCOPY WITH PROPOFOL  N/A 03/15/2021   Procedure: COLONOSCOPY WITH PROPOFOL ;  Surgeon: Cindie Carlin POUR, DO;  [facility];  Service: Endoscopy;  Laterality: N/A;  3:00pm   colonscopy  2005   Dr. Claudene   ENDOSCOPIC MUCOSAL RESECTION N/A 12/11/2023   Procedure: ENDOSCOPIC MUCOSAL RESECTION;  Surgeon: Wilhelmenia Aloha Raddle., MD;  Location: [facility];  Service: Gastroenterology;  Laterality: N/A;   ENTEROSCOPY N/A 07/25/2023   Procedure: ENTEROSCOPY;  Surgeon: Cindie Carlin POUR, DO;  [facility];  Service: Endoscopy;  Laterality: N/A;  1:45 pm, asa 2   ENTEROSCOPY N/A 12/11/2023   Procedure: ENTEROSCOPY;  Surgeon: Wilhelmenia Aloha Raddle., MD;  Location: [facility];  Service: Gastroenterology;  Laterality: N/A;   ESOPHAGOGASTRODUODENOSCOPY  12/2009   chronic gastritis   ESOPHAGOGASTRODUODENOSCOPY N/A 05/17/2017   Dr. Harvey; gastritis, normal small bowel biopsies, multiple gastric polyps. fundic gland polyps   ESOPHAGOGASTRODUODENOSCOPY (EGD) WITH PROPOFOL  N/A 04/21/2020   Rourk: multiple gastric polyps, biopsy of one c/w fundic gland. gastric biopsy showed mild chronic gastritis but no H.pylori   ESOPHAGOGASTRODUODENOSCOPY (EGD) WITH PROPOFOL  N/A 04/20/2023   Procedure: ESOPHAGOGASTRODUODENOSCOPY (EGD) WITH PROPOFOL ;  Surgeon: Cindie Carlin POUR, DO;  [facility];  Service: Endoscopy;  Laterality: N/A;  1:00 pm, asa 3, pt knows to arrive at 6:30  GIVENS CAPSULE STUDY N/A 05/30/2017   gastritis, no source for anemia identified   HEMOSTASIS CLIP PLACEMENT  12/11/2023   Procedure: HEMOSTASIS CLIP PLACEMENT;  Surgeon: Wilhelmenia Aloha Raddle., MD;  Location: [facility];  Service: Gastroenterology;;   HEMOSTASIS CONTROL   12/11/2023   Procedure: HEMOSTASIS CONTROL;  Surgeon: Wilhelmenia Aloha Raddle., MD;  Location: [facility];  Service: Gastroenterology;;   HOT HEMOSTASIS N/A 12/11/2023   Procedure: HOT HEMOSTASIS (ARGON PLASMA COAGULATION/BICAP);  Surgeon: Wilhelmenia Aloha Raddle., MD;  Location: [facility];  Service: Gastroenterology;  Laterality: N/A;   left knee surgery Left 12/30/2015   PARTIAL KNEE ARTHROPLASTY Left 08/09/2017   Procedure: LEFT UNICOMPARTMENTAL KNEE;  Surgeon: Melodi Lerner, MD;  Location: [facility];  Service: Orthopedics;  Laterality: Left;   POLYPECTOMY  04/21/2020   Procedure: POLYPECTOMY;  Surgeon: Shaaron Lamar HERO, MD;  [facility];  Service: Endoscopy;;  gastric   Resection of left lobe of thyroid      right carpal tunnel release     rt. knee athroscopy  2004   SAVORY DILATION N/A 05/17/2017   Procedure: SAVORY DILATION;  Surgeon: Harvey Margo CROME, MD;  [facility]: [facility];  Service: Endoscopy;  Laterality: N/A;   SUBMUCOSAL LIFTING INJECTION  12/11/2023   Procedure: SUBMUCOSAL LIFTING INJECTION;  Surgeon: Wilhelmenia Aloha Raddle., MD;  Location: [facility];  Service: Gastroenterology;;   SUBMUCOSAL TATTOO INJECTION  12/11/2023   Procedure: SUBMUCOSAL TATTOO INJECTION;  Surgeon: Wilhelmenia Aloha Raddle., MD;  Location: [facility];  Service: Gastroenterology;;   TOTAL ABDOMINAL HYSTERECTOMY  1994   UMBILICAL HERNIA REPAIR     Urethral dilation for stenosis  2009    Allergies: Allergies as of 08/07/2024 - Review Complete 07/09/2024  Allergen Reaction Noted   Cozaar  [losartan  potassium] Cough 07/04/2018   Dilaudid [hydromorphone hcl] Other (See Comments) 03/01/2011   Levaquin [levofloxacin] Other (See Comments) 07/28/2014   Nsaids Other (See Comments) 01/13/2010   Promethazine   05/17/2017    Medications: Outpatient Encounter Medications as of 08/07/2024  Medication Sig   acetaminophen  (TYLENOL ) 500 MG tablet Take 1 tablet (500 mg total) by mouth every 6 (six)  hours as needed.   amLODipine  (NORVASC ) 5 MG tablet TAKE 1 TABLET BY MOUTH DAILY   atorvastatin  (LIPITOR) 80 MG tablet TAKE 1 TABLET BY MOUTH ONCE  DAILY   Calcium  Carbonate-Vit D-Min (CALCIUM  1200) 1200-1000 MG-UNIT CHEW Take one tablet by mouth two times daily   citalopram  (CELEXA ) 40 MG tablet TAKE 1 TABLET BY MOUTH DAILY   furosemide  (LASIX ) 20 MG tablet TAKE 1 TABLET(20 MG) BY MOUTH DAILY   gabapentin  (NEURONTIN ) 100 MG capsule TAKE 2 CAPSULES BY MOUTH AT  BEDTIME FOR NEUROPATHIC PAIN   hydrochlorothiazide  (HYDRODIURIL ) 25 MG tablet TAKE 1 TABLET BY MOUTH DAILY   Iron-FA-B Cmp-C-Biot-Probiotic (FUSION PLUS) CAPS Take 1 capsule by mouth daily.   levothyroxine  (SYNTHROID ) 88 MCG tablet TAKE 1 TABLET BY MOUTH DAILY  BEFORE BREAKFAST   Multiple Vitamin (MULTIVITAMIN WITH MINERALS) TABS tablet Take 1 tablet by mouth daily.   omeprazole  (PRILOSEC) 20 MG capsule TAKE 1 CAPSULE(20 MG) BY MOUTH TWICE DAILY BEFORE A MEAL   Polyethyl Glycol-Propyl Glycol (SYSTANE OP) Place 1 drop into both eyes daily as needed (dry eyes).   potassium chloride  (KLOR-CON ) 10 MEQ tablet TAKE 1 TABLET BY MOUTH DAILY   sucralfate  (CARAFATE ) 1 g tablet Take 1 tablet (1 g total) by mouth 2 (two) times daily. (Patient taking differently: Take 1 g by mouth daily.)   Facility-Administered Encounter Medications as  of 08/07/2024  Medication   lidocaine  (PF) (XYLOCAINE ) 1 % injection 4 mL    Social History: Social History   Tobacco Use   Smoking status: Former    Current packs/day: 0.00    Average packs/day: 0.3 packs/day for 1 year (0.3 ttl pk-yrs)    Types: Cigarettes    Start date: 08/26/1971    Quit date: 08/25/1972    Years since quitting: 51.9    Passive exposure: Past   Smokeless tobacco: Never  Vaping Use   Vaping status: Never Used  Substance Use Topics   Alcohol use: No   Drug use: No    Family Medical History: Family History  Problem Relation Age of Onset   Pneumonia Mother    Healthy Father     Colon cancer Neg Hx    Liver disease Neg Hx    Esophageal cancer Neg Hx    Inflammatory bowel disease Neg Hx    Pancreatic cancer Neg Hx    Rectal cancer Neg Hx    Stomach cancer Neg Hx    Colon polyps Neg Hx     Physical Examination: There were no vitals filed for this visit.  General: Patient is well developed, well nourished, calm, collected, and in no apparent distress. Attention to examination is appropriate.  Respiratory: Patient is breathing without any difficulty.   NEUROLOGICAL:     Awake, alert, oriented to person, place, and time.  Speech is clear and fluent. Fund of knowledge is appropriate.   Cranial Nerves: Pupils equal round and reactive to light.  Facial tone is symmetric.    *** ROM of cervical spine *** pain *** posterior cervical tenderness. *** tenderness in bilateral trapezial region.   *** ROM of lumbar spine *** pain *** posterior lumbar tenderness.   No abnormal lesions on exposed skin.   Strength: Side Biceps Triceps Deltoid Interossei Grip Wrist Ext. Wrist Flex.  R 5 5 5 5 5 5 5   L 5 5 5 5 5 5 5    Side Iliopsoas Quads Hamstring PF DF EHL  R 5 5 5 5 5 5   L 5 5 5 5 5 5    Reflexes are ***2+ and symmetric at the biceps, brachioradialis, patella and achilles.   Hoffman's is absent.  Clonus is not present.   Bilateral upper and lower extremity sensation is intact to light touch.     Gait is normal.   ***No difficulty with tandem gait.    Medical Decision Making  Imaging: Lumbar MRI dated 07/15/24:  FINDINGS: There is stable mild levoscoliosis. Very mild grade 1 anterior spondylolisthesis is identified to the right of midline at the L4-5 level. Multilevel facet arthrosis is present most notably at L3-4 and L4-5, right slightly greater than left. There is inflammation and mild enhancement of the right L3-4 facet. There is no vertebral body height loss, subluxation or marrow replacing process. The sacrum and SI joints are unremarkable so far  as visualized. Conus and cauda equina are unremarkable.   T12-L1: There is no focal disc protrusion, foraminal or spinal stenosis.   L1-2: There is no focal disc protrusion, foraminal or spinal stenosis. Mild facet arthrosis.   L2-3: There is no focal disc protrusion, foraminal or spinal stenosis. Mild facet arthrosis.   L3-4: Mild broad-based disc osteophyte without significant foraminal or spinal stenosis. Mild-to-moderate facet arthrosis is present, right slightly greater than left. There is mild facet edema on the right.   L4-5: Mild broad-based bulge and mild-to-moderate facet arthrosis. There  is slight caudal foraminal narrowing bilaterally, left slightly greater than right. Correlation for mild L4 radiculopathy. No significant spinal stenosis is present.   L5-S1: Broad-based bulge with a left paracentral component effacing the ventral thecal sac. This results in moderate left foraminal narrowing with mild abutment of the exiting L5 nerve. This is unchanged when compared with the prior examination. Correlation for mild left L5 radicular symptoms.   No suspicious enhancement following contrast administration.   The retroperitoneal structures demonstrate no significant abnormality.   IMPRESSION: Stable degenerative spondylosis with mild levoscoliosis.   Moderate facet arthrosis is present most notably at L3-4 and L4-5, right slightly greater than left with mild facet edema and enhancement on the right at the L3-4 level.   Mild foraminal narrowing most notably at L4-5 and L5-S1, left greater than right. Correlation for mild radicular symptoms. No significant interval change when compared with the prior examination.   Electronically signed by: Norleen Satchel MD 07/15/2024 10:29 AM EDT RP Workstation: [id number]  I have personally reviewed the images and agree with the above interpretation.  Assessment and Plan: Laura [name] is a pleasant 73 y.o. female has  ***  Treatment options discussed with patient and following plan made:   - Order for physical therapy for *** spine ***. Patient to call to schedule appointment. *** - Continue current medications including ***. Reviewed dosing and side effects.  - Prescription for ***. Reviewed dosing and side effects. Take with food.  - Prescription for *** to take prn muscle spasms. Reviewed dosing and side effects. Discussed this can cause drowsiness.  - MRI of *** to further evaluate *** radiculopathy. No improvement time or medications (***).  - Referral to PMR at [facility] to discuss possible *** injections.  - Will schedule phone visit to review MRI results once I get them back.   I spent a total of *** minutes in face-to-face and non-face-to-face activities related to this patient's care today including review of outside records, review of imaging, review of symptoms, physical exam, discussion of differential diagnosis, discussion of treatment options, and documentation.   Thank you for involving me in the care of this patient.   Glade Boys PA-C Dept. of Neurosurgery

## 2024-08-07 ENCOUNTER — Ambulatory Visit: Admitting: Orthopedic Surgery

## 2024-08-13 ENCOUNTER — Other Ambulatory Visit: Payer: Self-pay | Admitting: Family Medicine

## 2024-08-28 ENCOUNTER — Telehealth: Payer: Self-pay | Admitting: Gastroenterology

## 2024-08-28 NOTE — Telephone Encounter (Signed)
 Pt came into office at [facility]. And said she had recently got a refill on Sucralfate  1 gm tablets and no doesn't know what she did with them.  She presented a bottle but there were only a few in there..... I am really not sure what she is wanting because she did say that she probably couldn't get another refill since she just got one.  She wants some one to call her.  [phone]

## 2024-08-29 NOTE — Telephone Encounter (Signed)
 Pt phoned back to advise that she found her medication under her coat on the bed. Therisa Stager, NP was advised of this.

## 2024-09-03 ENCOUNTER — Inpatient Hospital Stay: Attending: Physician Assistant

## 2024-09-03 DIAGNOSIS — E785 Hyperlipidemia, unspecified: Secondary | ICD-10-CM | POA: Insufficient documentation

## 2024-09-03 DIAGNOSIS — K317 Polyp of stomach and duodenum: Secondary | ICD-10-CM | POA: Diagnosis not present

## 2024-09-03 DIAGNOSIS — I10 Essential (primary) hypertension: Secondary | ICD-10-CM | POA: Diagnosis not present

## 2024-09-03 DIAGNOSIS — Z79890 Hormone replacement therapy (postmenopausal): Secondary | ICD-10-CM | POA: Diagnosis not present

## 2024-09-03 DIAGNOSIS — E063 Autoimmune thyroiditis: Secondary | ICD-10-CM | POA: Diagnosis not present

## 2024-09-03 DIAGNOSIS — Z807 Family history of other malignant neoplasms of lymphoid, hematopoietic and related tissues: Secondary | ICD-10-CM | POA: Insufficient documentation

## 2024-09-03 DIAGNOSIS — D563 Thalassemia minor: Secondary | ICD-10-CM | POA: Insufficient documentation

## 2024-09-03 DIAGNOSIS — D649 Anemia, unspecified: Secondary | ICD-10-CM | POA: Diagnosis present

## 2024-09-03 DIAGNOSIS — Z87891 Personal history of nicotine dependence: Secondary | ICD-10-CM | POA: Diagnosis not present

## 2024-09-03 DIAGNOSIS — Z8719 Personal history of other diseases of the digestive system: Secondary | ICD-10-CM | POA: Insufficient documentation

## 2024-09-03 DIAGNOSIS — K219 Gastro-esophageal reflux disease without esophagitis: Secondary | ICD-10-CM | POA: Insufficient documentation

## 2024-09-03 DIAGNOSIS — R5383 Other fatigue: Secondary | ICD-10-CM | POA: Diagnosis not present

## 2024-09-03 DIAGNOSIS — E611 Iron deficiency: Secondary | ICD-10-CM | POA: Insufficient documentation

## 2024-09-03 DIAGNOSIS — M8580 Other specified disorders of bone density and structure, unspecified site: Secondary | ICD-10-CM | POA: Insufficient documentation

## 2024-09-03 DIAGNOSIS — K2950 Unspecified chronic gastritis without bleeding: Secondary | ICD-10-CM | POA: Insufficient documentation

## 2024-09-03 DIAGNOSIS — Z79899 Other long term (current) drug therapy: Secondary | ICD-10-CM | POA: Diagnosis not present

## 2024-09-03 LAB — CBC WITH DIFFERENTIAL/PLATELET
Abs Immature Granulocytes: 0.01 K/uL (ref 0.00–0.07)
Basophils Absolute: 0 K/uL (ref 0.0–0.1)
Basophils Relative: 0 %
Eosinophils Absolute: 0 K/uL (ref 0.0–0.5)
Eosinophils Relative: 0 %
HCT: 34.9 % — ABNORMAL LOW (ref 36.0–46.0)
Hemoglobin: 11.1 g/dL — ABNORMAL LOW (ref 12.0–15.0)
Immature Granulocytes: 0 %
Lymphocytes Relative: 7 %
Lymphs Abs: 0.5 K/uL — ABNORMAL LOW (ref 0.7–4.0)
MCH: 28 pg (ref 26.0–34.0)
MCHC: 31.8 g/dL (ref 30.0–36.0)
MCV: 87.9 fL (ref 80.0–100.0)
Monocytes Absolute: 0.2 K/uL (ref 0.1–1.0)
Monocytes Relative: 3 %
Neutro Abs: 6 K/uL (ref 1.7–7.7)
Neutrophils Relative %: 90 %
Platelets: 266 K/uL (ref 150–400)
RBC: 3.97 MIL/uL (ref 3.87–5.11)
RDW: 12.4 % (ref 11.5–15.5)
WBC: 6.8 K/uL (ref 4.0–10.5)
nRBC: 0 % (ref 0.0–0.2)

## 2024-09-03 LAB — IRON AND TIBC
Iron: 28 ug/dL (ref 28–170)
Saturation Ratios: 10 % — ABNORMAL LOW (ref 10.4–31.8)
TIBC: 269 ug/dL (ref 250–450)
UIBC: 241 ug/dL

## 2024-09-03 LAB — FERRITIN: Ferritin: 97 ng/mL (ref 11–307)

## 2024-09-08 ENCOUNTER — Encounter: Payer: Self-pay | Admitting: Family Medicine

## 2024-09-09 NOTE — Progress Notes (Unsigned)
 [facility] [address], [place] [address]   CLINIC:  Medical Oncology/Hematology  PCP:  Laura Rollene BRAVO, MD [address] [place] [address] [phone]   REASON FOR VISIT:  Follow-up for normocytic anemia  CURRENT THERAPY: Surveillance  INTERVAL HISTORY:   Laura [name] 73 y.o. female returns for routine follow-up of normocytic anemia.   She was last seen by Laura Barefoot, PA-C on 03/05/2024.  At today's visit, she reports feeling fair apart from some fatigue. She denies any interval changes since her last visit.    She denies any rectal bleeding or melena. No pica, headaches, lightheadedness, syncope, chest pain, or dyspnea on exertion. She is taking ferrous bisglycinate every other day. She has 60% energy and 75% appetite. She endorses that she is maintaining a stable weight.  ASSESSMENT & PLAN:  1.  Normocytic anemia secondary to alpha thalassemia trait: - Patient seen at the request of Dr. Wilhelmenia due to labs from [facility] on 11/13/2023 showing Hgb 10.6/MCV 89 - Review of past labs (via [facility]) shows mild anemia since at least 2015, with baseline around Hgb 10 to 11 g/dL range - Patient reports lifelong anemia, even as a young woman - Previously worked up at [facility] by Dr. Zelphia / Dr. Mora.  Prior workup notable for the following: Full GI workup including EGD, colonoscopy, capsule endoscopy was negative for bleeding Hemoglobin electrophoresis was normal. No etiology of anemia was found. - Most recent workup (January/February 2025) Normal B12 and folate (labs via PCP and [facility], 12/22/2023).  Normal MMA and copper . Normal creatinine 0.96 ([facility] 11/13/2023) No evidence of hemolysis (normal DAT, reticulocytes, LDH) Marginal iron deficiency with ferritin 70, iron saturation 23%. Normal immunofixation, SPEP, light chains. - EGD (04/20/2023): For dyspepsia: Gastritis and duodenal polyp. - Small bowel  enteroscopy (12/11/2023): Polyp in the third/fourth part of duodenum, consistent with adenoma. - Colonoscopy (03/15/2021): Nonbleeding internal hemorrhoids, diverticulosis in the sigmoid and descending colon - No family history of anemia.  Maternal aunt has multiple myeloma. - No prior history of transfusion.  She took many years ago which caused constipation. - Alpha thalassemia DNA analysis (01/02/2024) carrier of single gene mutation consistent with alpha thalassemia trait. - Denies BRBPR/melena.  Ongoing fatigue.   No ice pica. - She is taking ferrous bisglycinate every other day  - Most recent labs (09/03/2024): Hgb 11.1/MCV 87.9, ferritin 97, iron saturation 10%. - PLAN: Long-term, stable anemia secondary to alpha thalassemia trait. -- Recommend to continue ferrous bisglycinate every other day - Lifelong mild anemia secondary to alpha thalassemia trait is stable.  Recommend discharge to PCP for monitoring of iron levels, being treated with oral iron supplementation.  2.  Social/family history: - Lives at home with her husband and is independent of ADLs and IADLs.  Non-smoker.  No family history of anemia.  Maternal aunt has multiple myeloma.  PLAN SUMMARY:  >> Discharge to PCP      REVIEW OF SYSTEMS:  Review of Systems  Constitutional:  Positive for fatigue. Negative for appetite change, chills, diaphoresis, fever and unexpected weight change.  HENT:   Negative for lump/mass and nosebleeds.   Eyes:  Negative for eye problems.  Respiratory:  Negative for cough, hemoptysis and shortness of breath.   Cardiovascular:  Negative for chest pain, leg swelling and palpitations.  Gastrointestinal:  Negative for abdominal pain, blood in stool, constipation, diarrhea, nausea and vomiting.  Genitourinary:  Negative for hematuria.   Skin: Negative.  Neurological:  Negative for dizziness, headaches and light-headedness.  Hematological:  Does not bruise/bleed easily.     PHYSICAL EXAM:  ECOG  PERFORMANCE STATUS: 0 - Asymptomatic  Vitals:   09/10/24 0830  BP: 120/63  Pulse: 61  Resp: 18  Temp: 97.7 F (36.5 C)  SpO2: 99%    Filed Weights   09/10/24 0830  Weight: 161 lb (73 kg)    Physical Exam Constitutional:      Appearance: Normal appearance. She is obese.  Cardiovascular:     Heart sounds: Normal heart sounds.  Pulmonary:     Breath sounds: Normal breath sounds.  Neurological:     General: No focal deficit present.     Mental Status: Mental status is at baseline.  Psychiatric:        Behavior: Behavior normal. Behavior is cooperative.    PAST MEDICAL/SURGICAL HISTORY:  Past Medical History:  Diagnosis Date   Anxiety    At low risk for fall 08/15/2018   Cystitis    Depression    Gastritis    GERD (gastroesophageal reflux disease)    Hashimoto's thyroiditis    Hx    Hyperlipidemia    Hypertension    Hypothyroidism    Normocytic anemia 2009 Hb 9.9-11.1   Osteopenia    Shoulder pain    Urinary incontinence    Past Surgical History:  Procedure Laterality Date   Bilateral tubal ligation     BIOPSY  04/21/2020   Procedure: BIOPSY;  Surgeon: Laura Lamar HERO, MD;  [facility];  Service: Endoscopy;;   BIOPSY  04/20/2023   Procedure: BIOPSY;  Surgeon: Laura Carlin POUR, DO;  [facility];  Service: Endoscopy;;   BREAST EXCISIONAL BIOPSY  2010   Excisional biopsy of benign left breast mass -lopoma    COLONOSCOPY  08/26/2011   SLF: 1. Internal hemorrhoids   COLONOSCOPY N/A 10/06/2015   DOQ:jwjo fissure or internal hemorrhoids/mild sigmoid colitis. benign colonic path    COLONOSCOPY WITH PROPOFOL  N/A 03/15/2021   Procedure: COLONOSCOPY WITH PROPOFOL ;  Surgeon: Laura Carlin POUR, DO;  [facility];  Service: Endoscopy;  Laterality: N/A;  3:00pm   colonscopy  2005   Dr. Claudene   ENDOSCOPIC MUCOSAL RESECTION N/A 12/11/2023   Procedure: ENDOSCOPIC MUCOSAL RESECTION;  Surgeon: Laura [name]., MD;  Location: [facility];  Service: Gastroenterology;  Laterality: N/A;   ENTEROSCOPY N/A 07/25/2023   Procedure: ENTEROSCOPY;  Surgeon: Laura Carlin POUR, DO;  [facility];  Service: Endoscopy;  Laterality: N/A;  1:45 pm, asa 2   ENTEROSCOPY N/A 12/11/2023   Procedure: ENTEROSCOPY;  Surgeon: Laura [name]., MD;  [facility]: [facility];  Service: Gastroenterology;  Laterality: N/A;   ESOPHAGOGASTRODUODENOSCOPY  12/2009   chronic gastritis   ESOPHAGOGASTRODUODENOSCOPY N/A 05/17/2017   Dr. Harvey; gastritis, normal small bowel biopsies, multiple gastric polyps. fundic gland polyps   ESOPHAGOGASTRODUODENOSCOPY (EGD) WITH PROPOFOL  N/A 04/21/2020   Rourk: multiple gastric polyps, biopsy of one c/w fundic gland. gastric biopsy showed mild chronic gastritis but no H.pylori   ESOPHAGOGASTRODUODENOSCOPY (EGD) WITH PROPOFOL  N/A 04/20/2023   Procedure: ESOPHAGOGASTRODUODENOSCOPY (EGD) WITH PROPOFOL ;  Surgeon: Laura Carlin POUR, DO;  [facility];  Service: Endoscopy;  Laterality: N/A;  1:00 pm, asa 3, pt knows to arrive at 6:30   GIVENS CAPSULE STUDY N/A 05/30/2017   gastritis, no source for anemia identified   HEMOSTASIS CLIP PLACEMENT  12/11/2023   Procedure: HEMOSTASIS CLIP PLACEMENT;  Surgeon: Laura [name].,  MD;  [facility];  Service: Gastroenterology;;   HEMOSTASIS CONTROL  12/11/2023   Procedure: HEMOSTASIS CONTROL;  Surgeon: Laura [name]., MD;  [facility];  Service: Gastroenterology;;   HOT HEMOSTASIS N/A 12/11/2023   Procedure: HOT HEMOSTASIS (ARGON PLASMA COAGULATION/BICAP);  Surgeon: Laura [name]., MD;  Location: [facility];  Service: Gastroenterology;  Laterality: N/A;   left knee surgery Left 12/30/2015   PARTIAL KNEE ARTHROPLASTY Left 08/09/2017   Procedure: LEFT UNICOMPARTMENTAL KNEE;  Surgeon: Melodi Lerner, MD;  Location: [facility];  Service: Orthopedics;  Laterality: Left;   POLYPECTOMY  04/21/2020   Procedure:  POLYPECTOMY;  Surgeon: Laura Lamar HERO, MD;  [facility];  Service: Endoscopy;;  gastric   Resection of left lobe of thyroid      right carpal tunnel release     rt. knee athroscopy  2004   SAVORY DILATION N/A 05/17/2017   Procedure: SAVORY DILATION;  Surgeon: Harvey Margo CROME, MD;  [facility];  Service: Endoscopy;  Laterality: N/A;   SUBMUCOSAL LIFTING INJECTION  12/11/2023   Procedure: SUBMUCOSAL LIFTING INJECTION;  Surgeon: Laura [name]., MD;  Location: [facility];  Service: Gastroenterology;;   SUBMUCOSAL TATTOO INJECTION  12/11/2023   Procedure: SUBMUCOSAL TATTOO INJECTION;  Surgeon: Laura [name]., MD;  Location: [facility];  Service: Gastroenterology;;   TOTAL ABDOMINAL HYSTERECTOMY  1994   UMBILICAL HERNIA REPAIR     Urethral dilation for stenosis  2009    SOCIAL HISTORY:  Social History   Socioeconomic History   Marital status: Married    Spouse name: Not on file   Number of children: 3   Years of education: Not on file   Highest education level: Not on file  Occupational History   Occupation: designer, 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    Occupation: retired  Tobacco Use   Smoking status: Former    Current packs/day: 0.00    Average packs/day: 0.3 packs/day for 1 year (0.3 ttl pk-yrs)    Types: Cigarettes    Start date: 08/26/1971    Quit date: 08/25/1972    Years since quitting: 52.0    Passive exposure: Past   Smokeless tobacco: Never  Vaping Use   Vaping status: Never Used  Substance and Sexual Activity   Alcohol use: No   Drug use: No   Sexual activity: Not Currently    Birth control/protection: Surgical    Comment: hyst  Other Topics Concern   Not on file  Social History Narrative   Not on file   Social Drivers of Health   Financial Resource Strain: Low Risk  (09/27/2023)   Overall Financial Resource Strain (CARDIA)    Difficulty of Paying Living Expenses: Not hard at all  Food Insecurity: No Food  Insecurity (09/27/2023)   Hunger Vital Sign    Worried About Running Out of Food in the Last Year: Never true    Ran Out of Food in the Last Year: Never true  Transportation Needs: No Transportation Needs (09/27/2023)   PRAPARE - Administrator, Civil Service (Medical): No    Lack of Transportation (Non-Medical): No  Physical Activity: Inactive (09/27/2023)   Exercise Vital Sign    Days of Exercise per Week: 0 days    Minutes of Exercise per Session: 0 min  Stress: Stress Concern Present (09/27/2023)   [employer] of Occupational Health - Occupational Stress Questionnaire    Feeling of Stress : To some extent  Social Connections: Moderately Integrated (09/27/2023)  Social Connection and Isolation Panel    Frequency of Communication with Friends and Family: Three times a week    Frequency of Social Gatherings with Friends and Family: Three times a week    Attends Religious Services: More than 4 times per year    Active Member of Clubs or Organizations: No    Attends Banker Meetings: Never    Marital Status: Married  Catering Manager Violence: Not At Risk (09/27/2023)   Humiliation, Afraid, Rape, and Kick questionnaire    Fear of Current or Ex-Partner: No    Emotionally Abused: No    Physically Abused: No    Sexually Abused: No    FAMILY HISTORY:  Family History  Problem Relation Age of Onset   Pneumonia Mother    Healthy Father    Colon cancer Neg Hx    Liver disease Neg Hx    Esophageal cancer Neg Hx    Inflammatory bowel disease Neg Hx    Pancreatic cancer Neg Hx    Rectal cancer Neg Hx    Stomach cancer Neg Hx    Colon polyps Neg Hx     CURRENT MEDICATIONS:  Outpatient Encounter Medications as of 09/10/2024  Medication Sig   acetaminophen  (TYLENOL ) 500 MG tablet Take 1 tablet (500 mg total) by mouth every 6 (six) hours as needed.   amLODipine  (NORVASC ) 5 MG tablet TAKE 1 TABLET BY MOUTH DAILY   atorvastatin  (LIPITOR) 80 MG tablet  TAKE 1 TABLET BY MOUTH ONCE  DAILY   Calcium  Carbonate-Vit D-Min (CALCIUM  1200) 1200-1000 MG-UNIT CHEW Take one tablet by mouth two times daily   citalopram  (CELEXA ) 40 MG tablet TAKE 1 TABLET BY MOUTH DAILY   furosemide  (LASIX ) 20 MG tablet TAKE 1 TABLET(20 MG) BY MOUTH DAILY   gabapentin  (NEURONTIN ) 100 MG capsule TAKE 2 CAPSULES BY MOUTH AT  BEDTIME FOR NEUROPATHIC PAIN   hydrochlorothiazide  (HYDRODIURIL ) 25 MG tablet TAKE 1 TABLET BY MOUTH DAILY   Iron-FA-B Cmp-C-Biot-Probiotic (FUSION PLUS) CAPS Take 1 capsule by mouth daily.   levothyroxine  (SYNTHROID ) 88 MCG tablet TAKE 1 TABLET BY MOUTH DAILY  BEFORE BREAKFAST   Multiple Vitamin (MULTIVITAMIN WITH MINERALS) TABS tablet Take 1 tablet by mouth daily.   omeprazole  (PRILOSEC) 20 MG capsule TAKE 1 CAPSULE(20 MG) BY MOUTH TWICE DAILY BEFORE A MEAL   Polyethyl Glycol-Propyl Glycol (SYSTANE OP) Place 1 drop into both eyes daily as needed (dry eyes).   potassium chloride  (KLOR-CON ) 10 MEQ tablet TAKE 1 TABLET BY MOUTH DAILY   sucralfate  (CARAFATE ) 1 g tablet Take 1 tablet (1 g total) by mouth 2 (two) times daily. (Patient taking differently: Take 1 g by mouth daily.)   Facility-Administered Encounter Medications as of 09/10/2024  Medication   lidocaine  (PF) (XYLOCAINE ) 1 % injection 4 mL    ALLERGIES:  Allergies  Allergen Reactions   Cozaar  [Losartan  Potassium] Cough   Dilaudid [Hydromorphone Hcl] Other (See Comments)    Confusion;altered state of mind   Levaquin [Levofloxacin] Other (See Comments)    Dry throat   Nsaids Other (See Comments)    per GI pt no longer to use NSAIDS    Promethazine      Legs itching, RESTLESS LEGS    LABORATORY DATA:  I have reviewed the labs as listed.  CBC    Component Value Date/Time   WBC 6.8 09/03/2024 0824   RBC 3.97 09/03/2024 0824   HGB 11.1 (L) 09/03/2024 0824   HGB 10.6 (L) 11/13/2023 0829   HCT 34.9 (  L) 09/03/2024 0824   HCT 34.3 11/13/2023 0829   PLT 266 09/03/2024 0824   PLT 302  11/13/2023 0829   MCV 87.9 09/03/2024 0824   MCV 89 11/13/2023 0829   MCH 28.0 09/03/2024 0824   MCHC 31.8 09/03/2024 0824   RDW 12.4 09/03/2024 0824   RDW 11.9 11/13/2023 0829   LYMPHSABS 0.5 (L) 09/03/2024 0824   LYMPHSABS 1.5 11/13/2023 0829   MONOABS 0.2 09/03/2024 0824   EOSABS 0.0 09/03/2024 0824   EOSABS 0.1 11/13/2023 0829   BASOSABS 0.0 09/03/2024 0824   BASOSABS 0.1 11/13/2023 0829      Latest Ref Rng & Units 05/14/2024    9:54 AM 12/08/2023   11:03 AM 11/13/2023    8:29 AM  CMP  Glucose 70 - 99 mg/dL 91  87  891   BUN 8 - 27 mg/dL 14  15  11    Creatinine 0.57 - 1.00 mg/dL 9.16  9.05  9.03   Sodium 134 - 144 mmol/L 139  140  141   Potassium 3.5 - 5.2 mmol/L 3.7  2.9  3.4   Chloride 96 - 106 mmol/L 100  98  102   CO2 20 - 29 mmol/L 22  32  23   Calcium  8.7 - 10.3 mg/dL 89.7  9.4  9.8   Total Protein 6.0 - 8.5 g/dL 7.2   6.8   Total Bilirubin 0.0 - 1.2 mg/dL 0.7   0.4   Alkaline Phos 44 - 121 IU/L 90   82   AST 0 - 40 IU/L 21   15   ALT 0 - 32 IU/L 25   14     DIAGNOSTIC IMAGING:  I have independently reviewed the relevant imaging and discussed with the patient.   WRAP UP:  All questions were answered. The patient knows to call the clinic with any problems, questions or concerns.  Medical decision making: Low  Time spent on visit: I spent 15 minutes counseling the patient face to face. The total time spent in the appointment was 22 minutes and more than 50% was on counseling.  Laura CHRISTELLA Barefoot, PA-C  09/10/24 9:45 AM

## 2024-09-10 ENCOUNTER — Inpatient Hospital Stay: Admitting: Physician Assistant

## 2024-09-10 VITALS — BP 120/63 | HR 61 | Temp 97.7°F | Resp 18 | Wt 161.0 lb

## 2024-09-10 DIAGNOSIS — D563 Thalassemia minor: Secondary | ICD-10-CM | POA: Diagnosis not present

## 2024-09-10 DIAGNOSIS — E611 Iron deficiency: Secondary | ICD-10-CM | POA: Diagnosis not present

## 2024-09-10 DIAGNOSIS — D649 Anemia, unspecified: Secondary | ICD-10-CM | POA: Diagnosis not present

## 2024-09-10 NOTE — Patient Instructions (Signed)
 [facility] at [facility] **VISIT SUMMARY & IMPORTANT INSTRUCTIONS **   You were seen today by Pleasant Barefoot PA-C for your anemia (low hemoglobin/red blood cells).    Your lifelong anemia is due to an inherited genetic condition called alpha thalassemia trait.  This is not causing any major problems, but explains your tendency to be mildly anemic.  Your iron levels look much better.  Continue taking iron pill (FERROUS BISGLYCINATE or IRON BISGLYCINATE) every other day.    You do not need any follow-up visits at the [facility].  You should continue to see your primary care doctor for labs every 6 to 12 months.  It is expected that you always have mild anemia due to your alpha thalassemia trait.  Your primary care doctor can make sure that your iron levels are staying where they need to.  ** Thank you for trusting me with your healthcare!  I strive to provide all of my patients with quality care at each visit.  If you receive a survey for this visit, I would be so grateful to you for taking the time to provide feedback.  Thank you in advance!  ~ Jenee Spaugh                                        Dr. Mickiel Davonna Pleasant Barefoot, PA-C     Delon Hope, NP   - - - - - - - - - - - - - - - - - -     Thank you for choosing [facility] at [facility] to provide your oncology and hematology care.  To afford each patient quality time with our provider, please arrive at least 15 minutes before your scheduled appointment time.   If you have a lab appointment with the Cancer Center please come in thru the Main Entrance and check in at the main information desk.  You need to re-schedule your appointment should you arrive 10 or more minutes late.  We strive to give you quality time with our providers, and arriving late affects you and other patients whose appointments are after yours.  Also, if you no show three or more times for appointments you  may be dismissed from the clinic at the providers discretion.     Again, thank you for choosing [facility].  Our hope is that these requests will decrease the amount of time that you wait before being seen by our physicians.       _____________________________________________________________  Should you have questions after your visit to [facility], please contact our office at [phone] and follow the prompts.  Our office hours are 8:00 a.m. and 4:30 p.m. Monday - Friday.  Please note that voicemails left after 4:00 p.m. may not be returned until the following business day.  We are closed weekends and major holidays.  You do have access to a nurse 24-7, just call the main number to the clinic [phone] and do not press any options, hold on the line and a nurse will answer the phone.    For prescription refill requests, have your pharmacy contact our office and allow 72 hours.

## 2024-09-25 ENCOUNTER — Ambulatory Visit (HOSPITAL_COMMUNITY)
Admission: RE | Admit: 2024-09-25 | Discharge: 2024-09-25 | Disposition: A | Discharge status: Home / Self Care | Source: Ambulatory Visit | Attending: Family Medicine | Admitting: Family Medicine

## 2024-09-25 DIAGNOSIS — Z780 Asymptomatic menopausal state: Secondary | ICD-10-CM | POA: Insufficient documentation

## 2024-09-30 ENCOUNTER — Ambulatory Visit: Payer: Self-pay | Admitting: Family Medicine

## 2024-09-30 ENCOUNTER — Ambulatory Visit (INDEPENDENT_AMBULATORY_CARE_PROVIDER_SITE_OTHER): Payer: Medicare Other

## 2024-09-30 VITALS — BP 106/58 | HR 62 | Resp 12 | Ht 60.0 in | Wt 160.0 lb

## 2024-09-30 DIAGNOSIS — Z1231 Encounter for screening mammogram for malignant neoplasm of breast: Secondary | ICD-10-CM

## 2024-09-30 DIAGNOSIS — Z0000 Encounter for general adult medical examination without abnormal findings: Secondary | ICD-10-CM | POA: Diagnosis not present

## 2024-09-30 NOTE — Progress Notes (Signed)
 Chief Complaint  Patient presents with   Medicare Wellness     Subjective:   Laura [name] is a 73 y.o. female who presents for a Medicare Annual Wellness Visit.  Allergies (verified) Cozaar  [losartan  potassium], Dilaudid [hydromorphone hcl], Levaquin [levofloxacin], Nsaids, and Promethazine    History: Past Medical History:  Diagnosis Date   Anxiety    At low risk for fall 08/15/2018   Cystitis    Depression    Gastritis    GERD (gastroesophageal reflux disease)    Hashimoto's thyroiditis    Hx    Hyperlipidemia    Hypertension    Hypothyroidism    Normocytic anemia 2009 Hb 9.9-11.1   Osteopenia    Shoulder pain    Urinary incontinence    Past Surgical History:  Procedure Laterality Date   Bilateral tubal ligation     BIOPSY  04/21/2020   Procedure: BIOPSY;  Surgeon: Shaaron Lamar HERO, MD;  [facility];  Service: Endoscopy;;   BIOPSY  04/20/2023   Procedure: BIOPSY;  Surgeon: Cindie Carlin POUR, DO;  [facility];  Service: Endoscopy;;   BREAST EXCISIONAL BIOPSY  2010   Excisional biopsy of benign left breast mass -lopoma    COLONOSCOPY  08/26/2011   SLF: 1. Internal hemorrhoids   COLONOSCOPY N/A 10/06/2015   DOQ:jwjo fissure or internal hemorrhoids/mild sigmoid colitis. benign colonic path    COLONOSCOPY WITH PROPOFOL  N/A 03/15/2021   Procedure: COLONOSCOPY WITH PROPOFOL ;  Surgeon: Cindie Carlin POUR, DO;  [facility];  Service: Endoscopy;  Laterality: N/A;  3:00pm   colonscopy  2005   Dr. Claudene   ENDOSCOPIC MUCOSAL RESECTION N/A 12/11/2023   Procedure: ENDOSCOPIC MUCOSAL RESECTION;  Surgeon: Wilhelmenia Aloha Raddle., MD;  Location: [facility];  Service: Gastroenterology;  Laterality: N/A;   ENTEROSCOPY N/A 07/25/2023   Procedure: ENTEROSCOPY;  Surgeon: Cindie Carlin POUR, DO;  [facility];  Service: Endoscopy;  Laterality: N/A;  1:45 pm, asa 2   ENTEROSCOPY N/A 12/11/2023   Procedure: ENTEROSCOPY;  Surgeon: Wilhelmenia  Aloha Raddle., MD;  [facility]: [facility];  Service: Gastroenterology;  Laterality: N/A;   ESOPHAGOGASTRODUODENOSCOPY  12/2009   chronic gastritis   ESOPHAGOGASTRODUODENOSCOPY N/A 05/17/2017   Dr. Harvey; gastritis, normal small bowel biopsies, multiple gastric polyps. fundic gland polyps   ESOPHAGOGASTRODUODENOSCOPY (EGD) WITH PROPOFOL  N/A 04/21/2020   Rourk: multiple gastric polyps, biopsy of one c/w fundic gland. gastric biopsy showed mild chronic gastritis but no H.pylori   ESOPHAGOGASTRODUODENOSCOPY (EGD) WITH PROPOFOL  N/A 04/20/2023   Procedure: ESOPHAGOGASTRODUODENOSCOPY (EGD) WITH PROPOFOL ;  Surgeon: Cindie Carlin POUR, DO;  [facility];  Service: Endoscopy;  Laterality: N/A;  1:00 pm, asa 3, pt knows to arrive at 6:30   GIVENS CAPSULE STUDY N/A 05/30/2017   gastritis, no source for anemia identified   HEMOSTASIS CLIP PLACEMENT  12/11/2023   Procedure: HEMOSTASIS CLIP PLACEMENT;  Surgeon: Wilhelmenia Aloha Raddle., MD;  Location: [facility];  Service: Gastroenterology;;   HEMOSTASIS CONTROL  12/11/2023   Procedure: HEMOSTASIS CONTROL;  Surgeon: Wilhelmenia Aloha Raddle., MD;  Location: [facility];  Service: Gastroenterology;;   HOT HEMOSTASIS N/A 12/11/2023   Procedure: HOT HEMOSTASIS (ARGON PLASMA COAGULATION/BICAP);  Surgeon: Wilhelmenia Aloha Raddle., MD;  Location: [facility];  Service: Gastroenterology;  Laterality: N/A;   left knee surgery Left 12/30/2015   PARTIAL KNEE ARTHROPLASTY Left 08/09/2017   Procedure: LEFT UNICOMPARTMENTAL KNEE;  Surgeon: Melodi Lerner, MD;  [facility];  Service: Orthopedics;  Laterality: Left;   POLYPECTOMY  04/21/2020   Procedure: POLYPECTOMY;  Surgeon: Shaaron Lamar HERO, MD;  [facility];  Service: Endoscopy;;  gastric   Resection of left lobe of thyroid      right carpal tunnel release     rt. knee athroscopy  2004   SAVORY DILATION N/A 05/17/2017   Procedure: SAVORY DILATION;  Surgeon: Harvey Margo CROME, MD;  [facility];  Service: Endoscopy;  Laterality: N/A;   SUBMUCOSAL LIFTING INJECTION  12/11/2023   Procedure: SUBMUCOSAL LIFTING INJECTION;  Surgeon: Wilhelmenia Aloha Raddle., MD;  Location: [facility];  Service: Gastroenterology;;   ROBLEY TATTOO INJECTION  12/11/2023   Procedure: SUBMUCOSAL TATTOO INJECTION;  Surgeon: Wilhelmenia Aloha Raddle., MD;  [facility];  Service: Gastroenterology;;   TOTAL ABDOMINAL HYSTERECTOMY  1994   UMBILICAL HERNIA REPAIR     Urethral dilation for stenosis  2009   Family History  Problem Relation Age of Onset   Pneumonia Mother    Healthy Father    Colon cancer Neg Hx    Liver disease Neg Hx    Esophageal cancer Neg Hx    Inflammatory bowel disease Neg Hx    Pancreatic cancer Neg Hx    Rectal cancer Neg Hx    Stomach cancer Neg Hx    Colon polyps Neg Hx    Social History   Occupational History   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    Occupation: retired  Tobacco Use   Smoking status: Former    Current packs/day: 0.00    Average packs/day: 0.3 packs/day for 1 year (0.3 ttl pk-yrs)    Types: Cigarettes    Start date: 08/26/1971    Quit date: 08/25/1972    Years since quitting: 52.1    Passive exposure: Past   Smokeless tobacco: Never  Vaping Use   Vaping status: Never Used  Substance and Sexual Activity   Alcohol use: No   Drug use: No   Sexual activity: Not Currently    Birth control/protection: Surgical    Comment: hyst   Tobacco Counseling Counseling given: Yes  SDOH Screenings   Food Insecurity: No Food Insecurity (09/30/2024)  Housing: Low Risk  (09/30/2024)  Transportation Needs: No Transportation Needs (09/30/2024)  Utilities: Not At Risk (09/30/2024)  Alcohol Screen: Low Risk  (08/30/2021)  Depression (PHQ2-9): Low Risk  (09/30/2024)  Financial Resource Strain: Low Risk  (09/27/2023)  Physical Activity: Inactive (09/30/2024)  Social Connections: Socially Integrated (09/30/2024)  Stress: No Stress  Concern Present (09/30/2024)  Tobacco Use: Medium Risk (09/30/2024)  Health Literacy: Adequate Health Literacy (09/30/2024)   See flowsheets for full screening details  Depression Screen PHQ 2 & 9 Depression Scale- Over the past 2 weeks, how often have you been bothered by any of the following problems? Little interest or pleasure in doing things: 0 Feeling down, depressed, or hopeless (PHQ Adolescent also includes...irritable): 0 PHQ-2 Total Score: 0 Trouble falling or staying asleep, or sleeping too much: 0 Feeling tired or having little energy: 0 Poor appetite or overeating (PHQ Adolescent also includes...weight loss): 0 Feeling bad about yourself - or that you are a failure or have let yourself or your family down: 0 Trouble concentrating on things, such as reading the newspaper or watching television (PHQ Adolescent also includes...like school work): 0 Moving or speaking so slowly that other people could have noticed. Or the opposite - being so fidgety or restless that you have been moving around a lot more than usual: 0 Thoughts that you would be  better off dead, or of hurting yourself in some way: 0 PHQ-9 Total Score: 0 If you checked off any problems, how difficult have these problems made it for you to do your work, take care of things at home, or get along with other people?: Not difficult at all  Depression Treatment Depression Interventions/Treatment : EYV7-0 Score <4 Follow-up Not Indicated     Goals Addressed               This Visit's Progress     I want to get back to walking. (pt-stated)         Visit info / Clinical Intake: Medicare Wellness Visit Type:: Subsequent Annual Wellness Visit Persons participating in visit:: patient Medicare Wellness Visit Mode:: In-person (required for WTM) Information given by:: patient Interpreter Needed?: No Pre-visit prep was completed: yes AWV questionnaire completed by patient prior to visit?: no Living arrangements::  lives with spouse/significant other Patient's Overall Health Status Rating: very good Typical amount of pain: none Does pain affect daily life?: no Are you currently prescribed opioids?: no  Dietary Habits and Nutritional Risks How many meals a day?: 2 Eats fruit and vegetables daily?: yes Most meals are obtained by: preparing own meals In the last 2 weeks, have you had any of the following?: none Diabetic:: no  Functional Status Activities of Daily Living (to include ambulation/medication): Independent Ambulation: Independent Medication Administration: Independent Home Management: Independent Manage your own finances?: yes Primary transportation is: driving Concerns about vision?: no *vision screening is required for WTM* Concerns about hearing?: no  Fall Screening Falls in the past year?: 0 Number of falls in past year: 0 Was there an injury with Fall?: 0 Fall Risk Category Calculator: 0 Patient Fall Risk Level: Low Fall Risk  Fall Risk Patient at Risk for Falls Due to: No Fall Risks Fall risk Follow up: Falls evaluation completed; Education provided; Falls prevention discussed  Home and Transportation Safety: All rugs have non-skid backing?: yes All stairs or steps have railings?: yes Grab bars in the bathtub or shower?: yes Have non-skid surface in bathtub or shower?: yes Good home lighting?: yes Regular seat belt use?: yes Hospital stays in the last year:: no  Cognitive Assessment Difficulty concentrating, remembering, or making decisions? : no Will 6CIT or Mini Cog be Completed: no 6CIT or Mini Cog Declined: patient alert, oriented, able to answer questions appropriately and recall recent events  Advance Directives (For Healthcare) Does Patient Have a Medical Advance Directive?: No Would patient like information on creating a medical advance directive?: Yes (MAU/Ambulatory/Procedural Areas - Information given)  Reviewed/Updated  Reviewed/Updated: Reviewed  All (Medical, Surgical, Family, Medications, Allergies, Care Teams, Patient Goals)        Objective:    Today's Vitals   09/30/24 0943  BP: (!) 106/58  Pulse: 62  Resp: 12  SpO2: 99%  Weight: 160 lb (72.6 kg)  Height: 5' (1.524 m)   Body mass index is 31.25 kg/m.  Current Medications (verified) Outpatient Encounter Medications as of 09/30/2024  Medication Sig   acetaminophen  (TYLENOL ) 500 MG tablet Take 1 tablet (500 mg total) by mouth every 6 (six) hours as needed.   amLODipine  (NORVASC ) 5 MG tablet TAKE 1 TABLET BY MOUTH DAILY   atorvastatin  (LIPITOR) 80 MG tablet TAKE 1 TABLET BY MOUTH ONCE  DAILY   Calcium  Carbonate-Vit D-Min (CALCIUM  1200) 1200-1000 MG-UNIT CHEW Take one tablet by mouth two times daily   citalopram  (CELEXA ) 40 MG tablet TAKE 1 TABLET BY MOUTH DAILY  furosemide  (LASIX ) 20 MG tablet TAKE 1 TABLET(20 MG) BY MOUTH DAILY   gabapentin  (NEURONTIN ) 100 MG capsule TAKE 2 CAPSULES BY MOUTH AT  BEDTIME FOR NEUROPATHIC PAIN   hydrochlorothiazide  (HYDRODIURIL ) 25 MG tablet TAKE 1 TABLET BY MOUTH DAILY   Iron-FA-B Cmp-C-Biot-Probiotic (FUSION PLUS) CAPS Take 1 capsule by mouth daily.   levothyroxine  (SYNTHROID ) 88 MCG tablet TAKE 1 TABLET BY MOUTH DAILY  BEFORE BREAKFAST   Multiple Vitamin (MULTIVITAMIN WITH MINERALS) TABS tablet Take 1 tablet by mouth daily.   omeprazole  (PRILOSEC) 20 MG capsule TAKE 1 CAPSULE(20 MG) BY MOUTH TWICE DAILY BEFORE A MEAL   Polyethyl Glycol-Propyl Glycol (SYSTANE OP) Place 1 drop into both eyes daily as needed (dry eyes).   potassium chloride  (KLOR-CON ) 10 MEQ tablet TAKE 1 TABLET BY MOUTH DAILY   sucralfate  (CARAFATE ) 1 g tablet Take 1 tablet (1 g total) by mouth 2 (two) times daily. (Patient taking differently: Take 1 g by mouth daily.)   Facility-Administered Encounter Medications as of 09/30/2024  Medication   lidocaine  (PF) (XYLOCAINE ) 1 % injection 4 mL   Hearing/Vision screen Hearing Screening - Comments:: Patient denies any  hearing difficulties.   Vision Screening - Comments:: Wears rx glasses - up to date with routine eye exams with  One[facility] Immunizations and Health Maintenance Health Maintenance  Topic Date Due   DTaP/Tdap/Td (3 - Td or Tdap) 06/05/2024   Mammogram  10/29/2024   COVID-19 Vaccine (8 - Moderna risk 2025-26 season) 01/14/2025   Medicare Annual Wellness (AWV)  09/30/2025   Bone Density Scan  09/25/2026   Colonoscopy  03/16/2031   Pneumococcal Vaccine: 50+ Years  Completed   Influenza Vaccine  Completed   Hepatitis C Screening  Completed   Zoster Vaccines- Shingrix  Completed   Meningococcal B Vaccine  Aged Out        Assessment/Plan:  This is a routine wellness examination for Laura [name].  Patient Care Team: [name], MD as PCP - General Nida, Ethelle ORN, MD as Consulting Physician (Endocrinology) Cindie Carlin POUR, DO as Consulting Physician (Internal Medicine) Darroll Oneil, DO (Optometry) Pennington, Rebekah M, PA-C as Physician Assistant (Oncology)  I have personally reviewed and noted the following in the patient's chart:   Medical and social history Use of alcohol, tobacco or illicit drugs  Current medications and supplements including opioid prescriptions. Functional ability and status Nutritional status Physical activity Advanced directives List of other physicians Hospitalizations, surgeries, and ER visits in previous 12 months Vitals Screenings to include cognitive, depression, and falls Referrals and appointments  Orders Placed This Encounter  Procedures   MM 3D SCREENING MAMMOGRAM BILATERAL BREAST    Standing Status:   Future    Expiration Date:   09/30/2025    Reason for Exam (SYMPTOM  OR DIAGNOSIS REQUIRED):   breast cancer screening    Preferred imaging location?:   [facility]   In addition, I have reviewed and discussed with patient certain preventive protocols, quality metrics, and best practice  recommendations. A written personalized care plan for preventive services as well as general preventive health recommendations were provided to patient.   Kathleen Tamm, CMA   09/30/2024   Return October 01, 2025 at 9:20am, for your yearly Medicare Wellness Visit in person.  After Visit Summary: (In Person-Printed) AVS printed and given to the patient  Nurse Notes: mammogram order placed

## 2024-09-30 NOTE — Patient Instructions (Signed)
 Ms. Lefebre,  Thank you for taking the time for your Medicare Wellness Visit. I appreciate your continued commitment to your health goals. Please review the care plan we discussed, and feel free to reach out if I can assist you further.  Please note that Annual Wellness Visits do not include a physical exam. Some assessments may be limited, especially if the visit was conducted virtually. If needed, we may recommend an in-person follow-up with your provider.  Ongoing Care Seeing your primary care provider every 3 to 6 months helps us  monitor your health and provide consistent, personalized care.   1 year follow up for Medicare Wellness with Medicare Wellness Nurse: October 01, 2025 at 9:20am  Referrals If a referral was made during today's visit and you haven't received any updates within two weeks, please contact the referred provider directly to check on the status.  Mammogram at [facility] Call [phone] to schedule your screening No perfumes, lotions, or deodorants the day of your screening. You can schedule your mammogram through mychart!   Recommended Screenings:  Health Maintenance  Topic Date Due   DTaP/Tdap/Td vaccine (3 - Td or Tdap) 06/05/2024   Breast Cancer Screening  10/29/2024   COVID-19 Vaccine (8 - Moderna risk 2025-26 season) 01/14/2025   Medicare Annual Wellness Visit  09/30/2025   Osteoporosis screening with Bone Density Scan  09/25/2026   Colon Cancer Screening  03/16/2031   Pneumococcal Vaccine for age over 71  Completed   Flu Shot  Completed   Hepatitis C Screening  Completed   Zoster (Shingles) Vaccine  Completed   Meningitis B Vaccine  Aged Out       09/30/2024    9:47 AM  Advanced Directives  Does Patient Have a Medical Advance Directive? No  Would patient like information on creating a medical advance directive? Yes (MAU/Ambulatory/Procedural Areas - Information given)    Vision: Annual vision screenings are recommended for early detection of  glaucoma, cataracts, and diabetic retinopathy. These exams can also reveal signs of chronic conditions such as diabetes and high blood pressure.  Dental: Annual dental screenings help detect early signs of oral cancer, gum disease, and other conditions linked to overall health, including heart disease and diabetes.  Please see the attached documents for additional preventive care recommendations.

## 2024-10-01 ENCOUNTER — Telehealth: Payer: Self-pay | Admitting: Family Medicine

## 2024-10-01 NOTE — Telephone Encounter (Signed)
 Given to provider

## 2024-10-01 NOTE — Telephone Encounter (Signed)
 Placard  Noted Copied Sleeved Original placed in provider box Copy placed front desk folder

## 2024-10-03 NOTE — Telephone Encounter (Signed)
 Patient picked up form

## 2024-10-03 NOTE — Telephone Encounter (Signed)
 Completed. Copied and placed up front. Pt informed

## 2024-10-04 ENCOUNTER — Telehealth: Payer: Self-pay | Admitting: "Endocrinology

## 2024-10-04 DIAGNOSIS — R7303 Prediabetes: Secondary | ICD-10-CM

## 2024-10-04 DIAGNOSIS — E782 Mixed hyperlipidemia: Secondary | ICD-10-CM

## 2024-10-04 DIAGNOSIS — E890 Postprocedural hypothyroidism: Secondary | ICD-10-CM

## 2024-10-04 NOTE — Telephone Encounter (Signed)
 Pt needs labs updated

## 2024-10-04 NOTE — Telephone Encounter (Signed)
 Labs updated and sent to [facility].

## 2024-10-14 ENCOUNTER — Telehealth: Payer: Self-pay | Admitting: Family Medicine

## 2024-10-14 ENCOUNTER — Other Ambulatory Visit: Payer: Self-pay | Admitting: "Endocrinology

## 2024-10-14 ENCOUNTER — Other Ambulatory Visit: Payer: Self-pay

## 2024-10-14 DIAGNOSIS — E890 Postprocedural hypothyroidism: Secondary | ICD-10-CM

## 2024-10-14 MED ORDER — AMLODIPINE BESYLATE 5 MG PO TABS: 5.0000 mg | ORAL_TABLET | Freq: Every day | ORAL | 2 refills | Status: DC

## 2024-10-14 NOTE — Telephone Encounter (Signed)
 Patient came by the office and needs refill on this medicine and she is about out. Can it be sent to [facility]  amLODipine  (NORVASC ) 5 MG tablet [[id number]]

## 2024-10-14 NOTE — Telephone Encounter (Signed)
 Sent!

## 2024-10-15 LAB — LIPID PANEL
Chol/HDL Ratio: 3.1 ratio (ref 0.0–4.4)
Cholesterol, Total: 172 mg/dL (ref 100–199)
HDL: 56 mg/dL (ref >39)
LDL Chol Calc (NIH): 97 mg/dL (ref 0–99)
Triglycerides: 104 mg/dL (ref 0–149)
VLDL Cholesterol Cal: 19 mg/dL (ref 5–40)

## 2024-10-15 LAB — TSH: TSH: 2.14 u[IU]/mL (ref 0.450–4.500)

## 2024-10-15 LAB — T4, FREE: Free T4: 1.14 ng/dL (ref 0.82–1.77)

## 2024-10-18 ENCOUNTER — Ambulatory Visit: Payer: Medicare Other | Admitting: "Endocrinology

## 2024-10-18 ENCOUNTER — Encounter: Payer: Self-pay | Admitting: "Endocrinology

## 2024-10-18 VITALS — BP 110/66 | HR 64 | Ht 63.0 in | Wt 157.6 lb

## 2024-10-18 DIAGNOSIS — E890 Postprocedural hypothyroidism: Secondary | ICD-10-CM | POA: Diagnosis not present

## 2024-10-18 DIAGNOSIS — E782 Mixed hyperlipidemia: Secondary | ICD-10-CM

## 2024-10-18 NOTE — Progress Notes (Signed)
 10/18/2024  Endocrinology follow-up note  Complaint: Follow-up for hypothyroidism  HPI  Laura [name] is a 73 y.o.-year-old female,  She has  medical history of goiter status post left hemithyroidectomy 20+ years ago, unremarkable thyroid /neck ultrasound in 2012.  She is currently on levothyroxine  88 mcg p.o. daily before breakfast.    Her pre-visit thyroid  function tests are consistent with appropriate replacement.  She has no new complaints today.  She presents with 15 pounds of weight loss, dealing with social pressure from illness of her son and husband.  She has hyperlipidemia, currently on atorvastatin  80 mg p.o. daily at bedtime-responding to treatment.    -  She denies any heat/cold intolerance, palpitations, tremors.    Pt denies feeling nodules in neck, hoarseness, dysphagia/odynophagia, SOB with lying down.  No history of radiation exposure to her head and neck.   No recent use of iodine supplements.   ROS: Constitutional: + Progressive weight loss, + fatigue, no subjective hyperthermia nor hypothermia.     PE: BP 110/66 (BP Location: Left Arm, Patient Position: Sitting, Cuff Size: Large)   Pulse 64   Ht 5' 3 (1.6 m)   Wt 157 lb 9.6 oz (71.5 kg)   BMI 27.92 kg/m  Wt Readings from Last 3 Encounters:  10/18/24 157 lb 9.6 oz (71.5 kg)  09/30/24 160 lb (72.6 kg)  09/10/24 161 lb (73 kg)    Physical Exam- Limited  Constitutional:  Body mass index is 27.92 kg/m. , not in acute distress, normal state of mind    Recent Results (from the past 2160 hours)  CBC with Differential/Platelet     Status: Abnormal   Collection Time: 09/03/24  8:24 AM  Result Value Ref Range   WBC 6.8 4.0 - 10.5 K/uL   RBC 3.97 3.87 - 5.11 MIL/uL   Hemoglobin 11.1 (L) 12.0 - 15.0 g/dL   HCT 65.0 (L) 63.9 - 53.9 %   MCV 87.9 80.0 - 100.0 fL   MCH 28.0 26.0 - 34.0 pg   MCHC 31.8 30.0 - 36.0 g/dL   RDW 87.5 88.4 - 84.4 %   Platelets 266 150 - 400 K/uL   nRBC 0.0 0.0 - 0.2 %    Neutrophils Relative % 90 %   Neutro Abs 6.0 1.7 - 7.7 K/uL   Lymphocytes Relative 7 %   Lymphs Abs 0.5 (L) 0.7 - 4.0 K/uL   Monocytes Relative 3 %   Monocytes Absolute 0.2 0.1 - 1.0 K/uL   Eosinophils Relative 0 %   Eosinophils Absolute 0.0 0.0 - 0.5 K/uL   Basophils Relative 0 %   Basophils Absolute 0.0 0.0 - 0.1 K/uL   Immature Granulocytes 0 %   Abs Immature Granulocytes 0.01 0.00 - 0.07 K/uL    Comment: Performed at [facility], [address], [place], [place] [address]  Iron and TIBC     Status: Abnormal   Collection Time: 09/03/24  8:24 AM  Result Value Ref Range   Iron 28 28 - 170 ug/dL   TIBC 730 749 - 549 ug/dL   Saturation Ratios 10 (L) 10.4 - 31.8 %   UIBC 241 ug/dL    Comment: Performed at [facility], [address] [place], [place] [address]  Ferritin     Status: None   Collection Time: 09/03/24  8:24 AM  Result Value Ref Range   Ferritin 97 11 - 307 ng/mL    Comment: Performed at [facility], [address], [place], [place] [address]  T4, Free     Status: None   Collection Time: 10/14/24  9:57 AM  Result Value Ref Range   Free T4 1.14 0.82 - 1.77 ng/dL  TSH     Status: None   Collection Time: 10/14/24  9:57 AM  Result Value Ref Range   TSH 2.140 0.450 - 4.500 uIU/mL  Lipid Panel     Status: None   Collection Time: 10/14/24  9:57 AM  Result Value Ref Range   Cholesterol, Total 172 100 - 199 mg/dL   Triglycerides 895 0 - 149 mg/dL   HDL 56 >60 mg/dL   VLDL Cholesterol Cal 19 5 - 40 mg/dL   LDL Chol Calc (NIH) 97 0 - 99 mg/dL   Chol/HDL Ratio 3.1 0.0 - 4.4 ratio    Comment:                                   T. Chol/HDL Ratio                                             Men  Women                               1/2 Avg.Risk  3.4    3.3                                   Avg.Risk  5.0    4.4                                2X Avg.Risk  9.6    7.1                                3X Avg.Risk 23.4   11.0    ASSESSMENT: 1. Hypothyroidism- postsurgical 2.   Hyperlipidemia  PLAN:  -Her previsit thyroid  function tests are consistent with appropriate replacement.  She is advised to continue levothyroxine  88 mcg p.o. daily before breakfast.    - We discussed about the correct intake of her thyroid  hormone, on empty stomach at fasting, with water , separated by at least 30 minutes from breakfast and other medications,  and separated by more than 4 hours from calcium , iron, multivitamins, acid reflux medications (PPIs). -Patient is made aware of the fact that thyroid  hormone replacement is needed for life, dose to be adjusted by periodic monitoring of thyroid  function tests.   -Her surveillance thyroid /neck ultrasound was unremarkable.  Her previsit labs show improving lipid panel with LDL at 97 improving from 121.     She is advised to continue atorvastatin  80 mg p.o. nightly.  Side effects and precaution discussed with her.    Her recent POC  A1c is 5.5%,  improving from prior measurements consistent with prediabetes.  She is encouraged to stay on whole food plant-based diet, regular exercise. -Her screening bone density from October 2019 was normal.    She is advised to maintain close follow up with her PMD for Primary care needs.  I spent  20  minutes in the  care of the patient today including review of labs from Thyroid  Function, CMP, and other relevant labs ; imaging/biopsy records (current and previous including abstractions from other facilities); face-to-face time discussing  her lab results and symptoms, medications doses, her options of short and long term treatment based on the latest standards of care / guidelines;   and documenting the encounter.  Dagoberto JAYSON Kitty  participated in the discussions, expressed understanding, and voiced agreement with the above plans.  All questions were answered to her satisfaction. she is encouraged to contact clinic should she have any questions or concerns prior to her return visit.    Ranny Earl,  MD Phone: [phone]  Fax: [phone]   10/18/2024, 10:15 AM

## 2024-10-22 ENCOUNTER — Other Ambulatory Visit: Payer: Self-pay | Admitting: Family Medicine

## 2024-10-23 ENCOUNTER — Other Ambulatory Visit: Payer: Self-pay

## 2024-10-23 ENCOUNTER — Telehealth: Payer: Self-pay | Admitting: Family Medicine

## 2024-10-23 MED ORDER — HYDROCHLOROTHIAZIDE 25 MG PO TABS: 25.0000 mg | ORAL_TABLET | Freq: Every day | ORAL | 2 refills | Status: AC

## 2024-10-23 MED ORDER — AMLODIPINE BESYLATE 5 MG PO TABS: 5.0000 mg | ORAL_TABLET | Freq: Every day | ORAL | 2 refills | Status: AC

## 2024-10-23 NOTE — Telephone Encounter (Signed)
 Prescription Request  10/23/2024  LOV: 05/14/2024  What is the name of the medication or equipment? amLODipine  (NORVASC ) 5 MG tablet [[id number]]   Have you contacted your pharmacy to request a refill? Yes   Which pharmacy would you like this sent to?   [facility] [address]   Patient notified that their request is being sent to the clinical staff for review and that they should receive a response within 2 business days.   Please advise at walked into office.

## 2024-10-23 NOTE — Telephone Encounter (Signed)
 Refill sent

## 2024-10-26 ENCOUNTER — Other Ambulatory Visit: Payer: Self-pay | Admitting: Family Medicine

## 2024-10-28 ENCOUNTER — Encounter: Payer: Self-pay | Admitting: *Deleted

## 2024-10-30 ENCOUNTER — Ambulatory Visit (HOSPITAL_COMMUNITY)

## 2024-11-04 ENCOUNTER — Ambulatory Visit (HOSPITAL_COMMUNITY)

## 2024-11-06 ENCOUNTER — Ambulatory Visit (HOSPITAL_COMMUNITY)
Admission: RE | Admit: 2024-11-06 | Discharge: 2024-11-06 | Disposition: A | Discharge status: Home / Self Care | Source: Ambulatory Visit | Attending: Family Medicine | Admitting: Family Medicine

## 2024-11-06 DIAGNOSIS — Z1231 Encounter for screening mammogram for malignant neoplasm of breast: Secondary | ICD-10-CM | POA: Diagnosis present

## 2024-12-13 ENCOUNTER — Encounter: Admitting: Family Medicine

## 2024-12-17 ENCOUNTER — Ambulatory Visit: Admitting: Family Medicine

## 2025-01-17 ENCOUNTER — Encounter: Admitting: Family Medicine

## 2025-04-18 ENCOUNTER — Ambulatory Visit: Admitting: "Endocrinology

## 2025-10-01 ENCOUNTER — Ambulatory Visit
